# Patient Record
Sex: Female | Born: 1980
Health system: Southern US, Community
[De-identification: ages and names within clinical notes are randomized; demographics above are authoritative.]

## PROBLEM LIST (undated history)

## (undated) DIAGNOSIS — I10 Essential (primary) hypertension: Secondary | ICD-10-CM

## (undated) DIAGNOSIS — R519 Headache, unspecified: Secondary | ICD-10-CM

## (undated) DIAGNOSIS — E119 Type 2 diabetes mellitus without complications: Secondary | ICD-10-CM

## (undated) DIAGNOSIS — D649 Anemia, unspecified: Secondary | ICD-10-CM

## (undated) DIAGNOSIS — F419 Anxiety disorder, unspecified: Secondary | ICD-10-CM

## (undated) DIAGNOSIS — J42 Unspecified chronic bronchitis: Secondary | ICD-10-CM

## (undated) DIAGNOSIS — N189 Chronic kidney disease, unspecified: Secondary | ICD-10-CM

## (undated) DIAGNOSIS — M726 Necrotizing fasciitis: Secondary | ICD-10-CM

## (undated) DIAGNOSIS — R51 Headache: Secondary | ICD-10-CM

## (undated) DIAGNOSIS — G4730 Sleep apnea, unspecified: Secondary | ICD-10-CM

## (undated) HISTORY — DX: Chronic kidney disease, unspecified: N18.9

## (undated) HISTORY — PX: BARIATRIC SURGERY: SHX1103

---

## 2007-11-01 ENCOUNTER — Emergency Department (HOSPITAL_COMMUNITY): Admission: EM | Admit: 2007-11-01 | Discharge: 2007-11-02 | Payer: Self-pay | Admitting: Emergency Medicine

## 2007-12-23 ENCOUNTER — Emergency Department (HOSPITAL_COMMUNITY): Admission: EM | Admit: 2007-12-23 | Discharge: 2007-12-23 | Payer: Self-pay | Admitting: Emergency Medicine

## 2008-02-21 ENCOUNTER — Emergency Department (HOSPITAL_COMMUNITY): Admission: EM | Admit: 2008-02-21 | Discharge: 2008-02-22 | Payer: Self-pay | Admitting: Emergency Medicine

## 2008-03-15 ENCOUNTER — Emergency Department (HOSPITAL_COMMUNITY): Admission: EM | Admit: 2008-03-15 | Discharge: 2008-03-16 | Payer: Self-pay | Admitting: Emergency Medicine

## 2009-12-17 ENCOUNTER — Emergency Department (HOSPITAL_COMMUNITY): Admission: EM | Admit: 2009-12-17 | Discharge: 2009-12-17 | Payer: Self-pay | Admitting: Emergency Medicine

## 2009-12-18 ENCOUNTER — Ambulatory Visit: Payer: Self-pay | Admitting: Vascular Surgery

## 2009-12-18 ENCOUNTER — Encounter (INDEPENDENT_AMBULATORY_CARE_PROVIDER_SITE_OTHER): Payer: Self-pay | Admitting: Emergency Medicine

## 2009-12-18 ENCOUNTER — Ambulatory Visit (HOSPITAL_COMMUNITY): Admission: RE | Admit: 2009-12-18 | Discharge: 2009-12-18 | Payer: Self-pay | Admitting: Emergency Medicine

## 2010-02-13 ENCOUNTER — Emergency Department (HOSPITAL_COMMUNITY): Admission: EM | Admit: 2010-02-13 | Discharge: 2010-02-13 | Payer: Self-pay | Admitting: Emergency Medicine

## 2010-03-08 ENCOUNTER — Emergency Department (HOSPITAL_COMMUNITY): Admission: EM | Admit: 2010-03-08 | Discharge: 2010-03-08 | Payer: Self-pay | Admitting: Emergency Medicine

## 2010-04-16 ENCOUNTER — Observation Stay (HOSPITAL_COMMUNITY): Admission: EM | Admit: 2010-04-16 | Discharge: 2010-04-16 | Payer: Self-pay | Admitting: Ophthalmology

## 2010-04-25 ENCOUNTER — Emergency Department (HOSPITAL_COMMUNITY): Admission: EM | Admit: 2010-04-25 | Discharge: 2010-04-25 | Payer: Self-pay | Admitting: Emergency Medicine

## 2010-06-30 ENCOUNTER — Emergency Department (HOSPITAL_COMMUNITY)
Admission: EM | Admit: 2010-06-30 | Discharge: 2010-07-01 | Payer: Self-pay | Source: Home / Self Care | Admitting: Emergency Medicine

## 2010-07-23 ENCOUNTER — Emergency Department (HOSPITAL_COMMUNITY)
Admission: EM | Admit: 2010-07-23 | Discharge: 2010-07-23 | Payer: Self-pay | Source: Home / Self Care | Admitting: Emergency Medicine

## 2010-08-02 LAB — URINALYSIS, ROUTINE W REFLEX MICROSCOPIC
Bilirubin Urine: NEGATIVE
Ketones, ur: NEGATIVE mg/dL
Leukocytes, UA: NEGATIVE
Nitrite: NEGATIVE
Protein, ur: NEGATIVE mg/dL
Specific Gravity, Urine: 1.025 (ref 1.005–1.030)
Urine Glucose, Fasting: 500 mg/dL — AB
Urobilinogen, UA: 0.2 mg/dL (ref 0.0–1.0)
pH: 5.5 (ref 5.0–8.0)

## 2010-08-02 LAB — POCT I-STAT, CHEM 8
BUN: 13 mg/dL (ref 6–23)
Calcium, Ion: 1.18 mmol/L (ref 1.12–1.32)
Chloride: 103 mEq/L (ref 96–112)
Creatinine, Ser: 0.6 mg/dL (ref 0.4–1.2)
Glucose, Bld: 401 mg/dL — ABNORMAL HIGH (ref 70–99)
HCT: 44 % (ref 36.0–46.0)
Hemoglobin: 15 g/dL (ref 12.0–15.0)
Potassium: 4 mEq/L (ref 3.5–5.1)
Sodium: 134 mEq/L — ABNORMAL LOW (ref 135–145)
TCO2: 25 mmol/L (ref 0–100)

## 2010-08-02 LAB — GLUCOSE, CAPILLARY
Glucose-Capillary: 411 mg/dL — ABNORMAL HIGH (ref 70–99)
Glucose-Capillary: 350 mg/dL — ABNORMAL HIGH (ref 70–99)

## 2010-08-02 LAB — URINE MICROSCOPIC-ADD ON

## 2010-08-02 LAB — POCT PREGNANCY, URINE: Preg Test, Ur: NEGATIVE

## 2010-09-10 ENCOUNTER — Emergency Department (HOSPITAL_COMMUNITY)
Admission: EM | Admit: 2010-09-10 | Discharge: 2010-09-11 | Disposition: A | Discharge status: Home / Self Care | Payer: Self-pay | Attending: Emergency Medicine | Admitting: Emergency Medicine

## 2010-09-10 DIAGNOSIS — Z79899 Other long term (current) drug therapy: Secondary | ICD-10-CM | POA: Insufficient documentation

## 2010-09-10 DIAGNOSIS — R609 Edema, unspecified: Secondary | ICD-10-CM | POA: Insufficient documentation

## 2010-09-10 DIAGNOSIS — E119 Type 2 diabetes mellitus without complications: Secondary | ICD-10-CM | POA: Insufficient documentation

## 2010-09-10 DIAGNOSIS — R51 Headache: Secondary | ICD-10-CM | POA: Insufficient documentation

## 2010-09-10 LAB — POCT I-STAT, CHEM 8
BUN: 12 mg/dL (ref 6–23)
Calcium, Ion: 1.15 mmol/L (ref 1.12–1.32)
Chloride: 104 mEq/L (ref 96–112)
Creatinine, Ser: 0.6 mg/dL (ref 0.4–1.2)
Glucose, Bld: 304 mg/dL — ABNORMAL HIGH (ref 70–99)
HCT: 41 % (ref 36.0–46.0)
Hemoglobin: 13.9 g/dL (ref 12.0–15.0)
Sodium: 138 mEq/L (ref 135–145)

## 2010-09-10 LAB — GLUCOSE, CAPILLARY
Glucose-Capillary: 293 mg/dL — ABNORMAL HIGH (ref 70–99)
Glucose-Capillary: 362 mg/dL — ABNORMAL HIGH (ref 70–99)

## 2010-09-11 LAB — GLUCOSE, CAPILLARY: Glucose-Capillary: 281 mg/dL — ABNORMAL HIGH (ref 70–99)

## 2010-09-27 LAB — GLUCOSE, CAPILLARY
Glucose-Capillary: 214 mg/dL — ABNORMAL HIGH (ref 70–99)
Glucose-Capillary: 289 mg/dL — ABNORMAL HIGH (ref 70–99)
Glucose-Capillary: 312 mg/dL — ABNORMAL HIGH (ref 70–99)
Glucose-Capillary: 313 mg/dL — ABNORMAL HIGH (ref 70–99)
Glucose-Capillary: 327 mg/dL — ABNORMAL HIGH (ref 70–99)
Glucose-Capillary: 376 mg/dL — ABNORMAL HIGH (ref 70–99)
Glucose-Capillary: 448 mg/dL — ABNORMAL HIGH (ref 70–99)

## 2010-09-28 LAB — DIFFERENTIAL
Eosinophils Relative: 3 % (ref 0–5)
Lymphocytes Relative: 48 % — ABNORMAL HIGH (ref 12–46)
Lymphs Abs: 5.1 10*3/uL — ABNORMAL HIGH (ref 0.7–4.0)
Monocytes Absolute: 0.6 10*3/uL (ref 0.1–1.0)
Monocytes Relative: 6 % (ref 3–12)
Neutro Abs: 4.6 10*3/uL (ref 1.7–7.7)

## 2010-09-28 LAB — URINALYSIS, ROUTINE W REFLEX MICROSCOPIC
Bilirubin Urine: NEGATIVE
Glucose, UA: >1000 mg/dL — AB
Ketones, ur: NEGATIVE mg/dL
Leukocytes, UA: NEGATIVE
Nitrite: NEGATIVE
Protein, ur: NEGATIVE mg/dL
Specific Gravity, Urine: 1.044 — ABNORMAL HIGH (ref 1.005–1.030)
Urobilinogen, UA: 0.2 mg/dL (ref 0.0–1.0)
pH: 5 (ref 5.0–8.0)

## 2010-09-28 LAB — CBC
HCT: 37.7 % (ref 36.0–46.0)
Hemoglobin: 13.2 g/dL (ref 12.0–15.0)
MCV: 82 fL (ref 78.0–100.0)
RBC: 4.6 MIL/uL (ref 3.87–5.11)
WBC: 10.7 10*3/uL — ABNORMAL HIGH (ref 4.0–10.5)

## 2010-09-28 LAB — POCT I-STAT, CHEM 8
BUN: 15 mg/dL (ref 6–23)
Calcium, Ion: 1.12 mmol/L (ref 1.12–1.32)
Chloride: 103 mEq/L (ref 96–112)
Creatinine, Ser: 0.7 mg/dL (ref 0.4–1.2)
TCO2: 24 mmol/L (ref 0–100)

## 2010-09-28 LAB — BASIC METABOLIC PANEL
BUN: 12 mg/dL (ref 6–23)
Chloride: 104 mEq/L (ref 96–112)
GFR calc Af Amer: >60 mL/min (ref >60)
Potassium: 3.7 mEq/L (ref 3.5–5.1)
Sodium: 134 mEq/L — ABNORMAL LOW (ref 135–145)

## 2010-09-28 LAB — GLUCOSE, CAPILLARY: Glucose-Capillary: 452 mg/dL — ABNORMAL HIGH (ref 70–99)

## 2010-09-28 LAB — URINE MICROSCOPIC-ADD ON

## 2010-09-30 LAB — URINALYSIS, ROUTINE W REFLEX MICROSCOPIC
Bilirubin Urine: NEGATIVE
Nitrite: NEGATIVE
Specific Gravity, Urine: 1.039 — ABNORMAL HIGH (ref 1.005–1.030)
Urobilinogen, UA: 0.2 mg/dL (ref 0.0–1.0)
pH: 5.5 (ref 5.0–8.0)

## 2010-09-30 LAB — GLUCOSE, CAPILLARY
Glucose-Capillary: 168 mg/dL — ABNORMAL HIGH (ref 70–99)
Glucose-Capillary: 239 mg/dL — ABNORMAL HIGH (ref 70–99)
Glucose-Capillary: 388 mg/dL — ABNORMAL HIGH (ref 70–99)
Glucose-Capillary: 447 mg/dL — ABNORMAL HIGH (ref 70–99)

## 2010-09-30 LAB — RAPID STREP SCREEN (MED CTR MEBANE ONLY): Streptococcus, Group A Screen (Direct): NEGATIVE

## 2010-09-30 LAB — POCT I-STAT, CHEM 8
BUN: 9 mg/dL (ref 6–23)
Calcium, Ion: 1.13 mmol/L (ref 1.12–1.32)
Chloride: 101 mEq/L (ref 96–112)
Creatinine, Ser: 0.6 mg/dL (ref 0.4–1.2)
TCO2: 22 mmol/L (ref 0–100)

## 2010-09-30 LAB — URINE MICROSCOPIC-ADD ON

## 2010-10-02 LAB — URINALYSIS, ROUTINE W REFLEX MICROSCOPIC
Bilirubin Urine: NEGATIVE
Leukocytes, UA: NEGATIVE
Nitrite: NEGATIVE
Specific Gravity, Urine: 1.035 — ABNORMAL HIGH (ref 1.005–1.030)
Urobilinogen, UA: 0.2 mg/dL (ref 0.0–1.0)
pH: 6.5 (ref 5.0–8.0)

## 2010-10-02 LAB — CBC
HCT: 34.4 % — ABNORMAL LOW (ref 36.0–46.0)
MCH: 30.1 pg (ref 26.0–34.0)
MCHC: 34 g/dL (ref 30.0–36.0)
MCV: 88.7 fL (ref 78.0–100.0)
Platelets: 284 10*3/uL (ref 150–400)
RDW: 13.8 % (ref 11.5–15.5)
WBC: 8.3 10*3/uL (ref 4.0–10.5)

## 2010-10-02 LAB — DIFFERENTIAL
Basophils Absolute: 0 10*3/uL (ref 0.0–0.1)
Basophils Relative: 1 % (ref 0–1)
Lymphocytes Relative: 42 % (ref 12–46)
Monocytes Relative: 7 % (ref 3–12)
Neutro Abs: 3.9 10*3/uL (ref 1.7–7.7)
Neutrophils Relative %: 47 % (ref 43–77)

## 2010-10-02 LAB — URINE MICROSCOPIC-ADD ON

## 2010-10-02 LAB — COMPREHENSIVE METABOLIC PANEL
Albumin: 3.3 g/dL — ABNORMAL LOW (ref 3.5–5.2)
Alkaline Phosphatase: 51 U/L (ref 39–117)
BUN: 9 mg/dL (ref 6–23)
Creatinine, Ser: 0.57 mg/dL (ref 0.4–1.2)
Glucose, Bld: 278 mg/dL — ABNORMAL HIGH (ref 70–99)
Potassium: 4 mEq/L (ref 3.5–5.1)
Total Protein: 6.9 g/dL (ref 6.0–8.3)

## 2010-10-04 LAB — BASIC METABOLIC PANEL
Calcium: 8.5 mg/dL (ref 8.4–10.5)
GFR calc Af Amer: >60 mL/min (ref >60)
GFR calc non Af Amer: >60 mL/min (ref >60)
Potassium: 3.8 mEq/L (ref 3.5–5.1)
Sodium: 133 mEq/L — ABNORMAL LOW (ref 135–145)

## 2010-10-04 LAB — CBC
HCT: 35.6 % — ABNORMAL LOW (ref 36.0–46.0)
Hemoglobin: 12.1 g/dL (ref 12.0–15.0)
RBC: 4.03 MIL/uL (ref 3.87–5.11)

## 2010-10-04 LAB — POCT I-STAT, CHEM 8
BUN: 12 mg/dL (ref 6–23)
Calcium, Ion: 1.13 mmol/L (ref 1.12–1.32)
Creatinine, Ser: 0.4 mg/dL (ref 0.4–1.2)
Glucose, Bld: 431 mg/dL — ABNORMAL HIGH (ref 70–99)
Hemoglobin: 12.2 g/dL (ref 12.0–15.0)
TCO2: 21 mmol/L (ref 0–100)

## 2010-10-04 LAB — DIFFERENTIAL
Lymphocytes Relative: 43 % (ref 12–46)
Lymphs Abs: 4.2 10*3/uL — ABNORMAL HIGH (ref 0.7–4.0)
Monocytes Absolute: 0.7 10*3/uL (ref 0.1–1.0)
Monocytes Relative: 7 % (ref 3–12)
Neutro Abs: 4.6 10*3/uL (ref 1.7–7.7)

## 2010-10-18 ENCOUNTER — Observation Stay (HOSPITAL_COMMUNITY)
Admission: EM | Admit: 2010-10-18 | Discharge: 2010-10-18 | Disposition: A | Discharge status: Home / Self Care | Payer: Self-pay | Source: Ambulatory Visit | Attending: Emergency Medicine | Admitting: Emergency Medicine

## 2010-10-18 ENCOUNTER — Emergency Department (HOSPITAL_COMMUNITY): Payer: Self-pay

## 2010-10-18 DIAGNOSIS — R072 Precordial pain: Secondary | ICD-10-CM

## 2010-10-18 DIAGNOSIS — R110 Nausea: Secondary | ICD-10-CM | POA: Insufficient documentation

## 2010-10-18 DIAGNOSIS — E119 Type 2 diabetes mellitus without complications: Principal | ICD-10-CM | POA: Insufficient documentation

## 2010-10-18 DIAGNOSIS — I10 Essential (primary) hypertension: Secondary | ICD-10-CM | POA: Insufficient documentation

## 2010-10-18 DIAGNOSIS — R079 Chest pain, unspecified: Secondary | ICD-10-CM | POA: Insufficient documentation

## 2010-10-18 LAB — BASIC METABOLIC PANEL
BUN: 11 mg/dL (ref 6–23)
CO2: 22 mEq/L (ref 19–32)
Chloride: 100 mEq/L (ref 96–112)
Creatinine, Ser: 0.58 mg/dL (ref 0.4–1.2)
Potassium: 4.1 mEq/L (ref 3.5–5.1)

## 2010-10-18 LAB — URINALYSIS, ROUTINE W REFLEX MICROSCOPIC
Glucose, UA: >1000 mg/dL — AB
Ketones, ur: 15 mg/dL — AB
Leukocytes, UA: NEGATIVE
Nitrite: NEGATIVE
Specific Gravity, Urine: 1.025 (ref 1.005–1.030)
pH: 5.5 (ref 5.0–8.0)

## 2010-10-18 LAB — RAPID URINE DRUG SCREEN, HOSP PERFORMED
Amphetamines: NOT DETECTED
Barbiturates: NOT DETECTED
Benzodiazepines: NOT DETECTED
Cocaine: NOT DETECTED
Opiates: NOT DETECTED
Tetrahydrocannabinol: NOT DETECTED

## 2010-10-18 LAB — CBC
MCH: 28.3 pg (ref 26.0–34.0)
MCHC: 35.1 g/dL (ref 30.0–36.0)
MCV: 80.7 fL (ref 78.0–100.0)
Platelets: 329 10*3/uL (ref 150–400)

## 2010-10-18 LAB — POCT CARDIAC MARKERS: Troponin i, poc: <0.05 ng/mL (ref 0.00–0.09)

## 2010-10-18 LAB — D-DIMER, QUANTITATIVE: D-Dimer, Quant: 0.44 ug/mL-FEU (ref 0.00–0.48)

## 2010-10-18 LAB — URINE MICROSCOPIC-ADD ON

## 2011-01-04 ENCOUNTER — Emergency Department (HOSPITAL_COMMUNITY)
Admission: EM | Admit: 2011-01-04 | Discharge: 2011-01-04 | Disposition: A | Discharge status: Home / Self Care | Payer: Self-pay | Attending: Emergency Medicine | Admitting: Emergency Medicine

## 2011-01-04 ENCOUNTER — Emergency Department (HOSPITAL_COMMUNITY): Payer: Self-pay

## 2011-01-04 DIAGNOSIS — M545 Low back pain, unspecified: Secondary | ICD-10-CM | POA: Insufficient documentation

## 2011-01-04 DIAGNOSIS — I10 Essential (primary) hypertension: Secondary | ICD-10-CM | POA: Insufficient documentation

## 2011-01-04 DIAGNOSIS — X58XXXA Exposure to other specified factors, initial encounter: Secondary | ICD-10-CM | POA: Insufficient documentation

## 2011-01-04 DIAGNOSIS — E669 Obesity, unspecified: Secondary | ICD-10-CM | POA: Insufficient documentation

## 2011-01-04 DIAGNOSIS — S335XXA Sprain of ligaments of lumbar spine, initial encounter: Secondary | ICD-10-CM | POA: Insufficient documentation

## 2011-01-04 DIAGNOSIS — E1169 Type 2 diabetes mellitus with other specified complication: Secondary | ICD-10-CM | POA: Insufficient documentation

## 2011-01-04 LAB — POCT I-STAT, CHEM 8
BUN: 9 mg/dL (ref 6–23)
Chloride: 101 mEq/L (ref 96–112)
Creatinine, Ser: 0.4 mg/dL — ABNORMAL LOW (ref 0.50–1.10)
Glucose, Bld: 471 mg/dL — ABNORMAL HIGH (ref 70–99)
Hemoglobin: 14.6 g/dL (ref 12.0–15.0)
Potassium: 3.9 mEq/L (ref 3.5–5.1)
Sodium: 136 mEq/L (ref 135–145)

## 2011-01-04 LAB — URINALYSIS, ROUTINE W REFLEX MICROSCOPIC
Bilirubin Urine: NEGATIVE
Glucose, UA: >1000 mg/dL — AB
Hgb urine dipstick: NEGATIVE
Specific Gravity, Urine: 1.015 (ref 1.005–1.030)
Urobilinogen, UA: 0.2 mg/dL (ref 0.0–1.0)
pH: 5 (ref 5.0–8.0)

## 2011-01-04 LAB — URINE MICROSCOPIC-ADD ON

## 2011-01-04 LAB — GLUCOSE, CAPILLARY: Glucose-Capillary: 350 mg/dL — ABNORMAL HIGH (ref 70–99)

## 2011-01-05 LAB — URINE CULTURE: Colony Count: 25000

## 2011-02-11 ENCOUNTER — Emergency Department (HOSPITAL_COMMUNITY)
Admission: EM | Admit: 2011-02-11 | Discharge: 2011-02-11 | Disposition: A | Discharge status: Home / Self Care | Payer: Self-pay | Attending: Emergency Medicine | Admitting: Emergency Medicine

## 2011-02-11 ENCOUNTER — Emergency Department (HOSPITAL_COMMUNITY): Payer: Self-pay

## 2011-02-11 DIAGNOSIS — I10 Essential (primary) hypertension: Secondary | ICD-10-CM | POA: Insufficient documentation

## 2011-02-11 DIAGNOSIS — E119 Type 2 diabetes mellitus without complications: Secondary | ICD-10-CM | POA: Insufficient documentation

## 2011-02-11 DIAGNOSIS — M7989 Other specified soft tissue disorders: Secondary | ICD-10-CM | POA: Insufficient documentation

## 2011-02-11 DIAGNOSIS — N390 Urinary tract infection, site not specified: Secondary | ICD-10-CM | POA: Insufficient documentation

## 2011-02-11 LAB — DIFFERENTIAL
Eosinophils Absolute: 0.2 10*3/uL (ref 0.0–0.7)
Eosinophils Relative: 2 % (ref 0–5)
Lymphocytes Relative: 40 % (ref 12–46)
Lymphs Abs: 3.5 10*3/uL (ref 0.7–4.0)
Monocytes Absolute: 0.5 10*3/uL (ref 0.1–1.0)

## 2011-02-11 LAB — COMPREHENSIVE METABOLIC PANEL
ALT: 22 U/L (ref 0–35)
BUN: 7 mg/dL (ref 6–23)
CO2: 27 mEq/L (ref 19–32)
Calcium: 9.6 mg/dL (ref 8.4–10.5)
Creatinine, Ser: 0.51 mg/dL (ref 0.50–1.10)
GFR calc Af Amer: >60 mL/min (ref >60)
GFR calc non Af Amer: >60 mL/min (ref >60)
Glucose, Bld: 359 mg/dL — ABNORMAL HIGH (ref 70–99)
Total Protein: 7.4 g/dL (ref 6.0–8.3)

## 2011-02-11 LAB — CBC
HCT: 37.8 % (ref 36.0–46.0)
MCH: 27.3 pg (ref 26.0–34.0)
MCHC: 33.3 g/dL (ref 30.0–36.0)
MCV: 81.8 fL (ref 78.0–100.0)
Platelets: 332 10*3/uL (ref 150–400)
RDW: 13.1 % (ref 11.5–15.5)

## 2011-02-11 LAB — POCT PREGNANCY, URINE: Preg Test, Ur: NEGATIVE

## 2011-02-11 LAB — URINALYSIS, ROUTINE W REFLEX MICROSCOPIC
Bilirubin Urine: NEGATIVE
Leukocytes, UA: NEGATIVE
Nitrite: NEGATIVE
Specific Gravity, Urine: 1.044 — ABNORMAL HIGH (ref 1.005–1.030)
Urobilinogen, UA: 0.2 mg/dL (ref 0.0–1.0)

## 2011-02-11 LAB — URINE MICROSCOPIC-ADD ON

## 2011-02-11 LAB — GLUCOSE, CAPILLARY: Glucose-Capillary: 324 mg/dL — ABNORMAL HIGH (ref 70–99)

## 2011-02-13 LAB — URINE CULTURE: Colony Count: 35000

## 2011-04-06 ENCOUNTER — Emergency Department (HOSPITAL_COMMUNITY)
Admission: EM | Admit: 2011-04-06 | Discharge: 2011-04-07 | Disposition: A | Discharge status: Home / Self Care | Payer: Self-pay | Attending: Emergency Medicine | Admitting: Emergency Medicine

## 2011-04-06 DIAGNOSIS — Z79899 Other long term (current) drug therapy: Secondary | ICD-10-CM | POA: Insufficient documentation

## 2011-04-06 DIAGNOSIS — I10 Essential (primary) hypertension: Secondary | ICD-10-CM | POA: Insufficient documentation

## 2011-04-06 DIAGNOSIS — R110 Nausea: Secondary | ICD-10-CM | POA: Insufficient documentation

## 2011-04-06 DIAGNOSIS — O24919 Unspecified diabetes mellitus in pregnancy, unspecified trimester: Secondary | ICD-10-CM | POA: Insufficient documentation

## 2011-04-06 DIAGNOSIS — E119 Type 2 diabetes mellitus without complications: Secondary | ICD-10-CM | POA: Insufficient documentation

## 2011-04-06 DIAGNOSIS — R42 Dizziness and giddiness: Secondary | ICD-10-CM | POA: Insufficient documentation

## 2011-04-06 LAB — URINALYSIS, ROUTINE W REFLEX MICROSCOPIC
Glucose, UA: >1000 mg/dL — AB
Ketones, ur: 40 mg/dL — AB
Nitrite: NEGATIVE
pH: 5.5 (ref 5.0–8.0)

## 2011-04-06 LAB — BASIC METABOLIC PANEL
CO2: 22 mEq/L (ref 19–32)
Glucose, Bld: 389 mg/dL — ABNORMAL HIGH (ref 70–99)
Potassium: 3.6 mEq/L (ref 3.5–5.1)
Sodium: 132 mEq/L — ABNORMAL LOW (ref 135–145)

## 2011-04-06 LAB — POCT PREGNANCY, URINE: Preg Test, Ur: POSITIVE

## 2011-04-06 LAB — URINE MICROSCOPIC-ADD ON

## 2011-04-06 LAB — CBC
Hemoglobin: 13.1 g/dL (ref 12.0–15.0)
RBC: 4.57 MIL/uL (ref 3.87–5.11)

## 2011-04-07 LAB — GLUCOSE, CAPILLARY
Glucose-Capillary: 316 mg/dL — ABNORMAL HIGH (ref 70–99)
Glucose-Capillary: 222 mg/dL — ABNORMAL HIGH (ref 70–99)

## 2011-04-12 LAB — URINALYSIS, ROUTINE W REFLEX MICROSCOPIC
Glucose, UA: >1000 — AB
Ketones, ur: NEGATIVE
Leukocytes, UA: NEGATIVE
Nitrite: NEGATIVE
Protein, ur: NEGATIVE

## 2011-04-12 LAB — DIFFERENTIAL
Eosinophils Absolute: 0.4
Lymphs Abs: 3.6
Monocytes Relative: 7
Neutrophils Relative %: 58

## 2011-04-12 LAB — POCT I-STAT, CHEM 8
BUN: 14
Chloride: 101
Creatinine, Ser: 0.6
Glucose, Bld: 436 — ABNORMAL HIGH
Hemoglobin: 13.3
Potassium: 3.9

## 2011-04-12 LAB — CBC
HCT: 38
Hemoglobin: 12.7
RDW: 14.6

## 2011-04-12 LAB — POCT PREGNANCY, URINE: Operator id: 161631

## 2011-04-14 LAB — POCT I-STAT 3, VENOUS BLOOD GAS (G3P V)
Bicarbonate: 24
Operator id: 196461
Patient temperature: 97.3
TCO2: 25
pH, Ven: 7.375 — ABNORMAL HIGH
pO2, Ven: 33

## 2011-04-14 LAB — URINE MICROSCOPIC-ADD ON

## 2011-04-14 LAB — POCT I-STAT, CHEM 8
Calcium, Ion: 1.11 — ABNORMAL LOW
Creatinine, Ser: 0.7
Hemoglobin: 14.6
Sodium: 133 — ABNORMAL LOW
TCO2: 21

## 2011-04-14 LAB — DIFFERENTIAL
Eosinophils Absolute: 0.3
Eosinophils Relative: 4
Lymphs Abs: 2.5
Monocytes Absolute: 0.6
Monocytes Relative: 7

## 2011-04-14 LAB — CBC
HCT: 39.5
Hemoglobin: 13.6
MCV: 85.4
RBC: 4.63
WBC: 9.1

## 2011-04-14 LAB — URINALYSIS, ROUTINE W REFLEX MICROSCOPIC
Glucose, UA: >1000 — AB
Hgb urine dipstick: NEGATIVE
Leukocytes, UA: NEGATIVE
Specific Gravity, Urine: >1.046 — ABNORMAL HIGH

## 2011-04-14 LAB — WET PREP, GENITAL

## 2011-04-15 LAB — HERPES SIMPLEX VIRUS CULTURE

## 2011-04-15 LAB — GC/CHLAMYDIA PROBE AMP, GENITAL
Chlamydia, DNA Probe: NEGATIVE
GC Probe Amp, Genital: NEGATIVE

## 2011-04-15 LAB — URINE MICROSCOPIC-ADD ON

## 2011-04-15 LAB — URINALYSIS, ROUTINE W REFLEX MICROSCOPIC
Bilirubin Urine: NEGATIVE
Glucose, UA: >1000 — AB
Hgb urine dipstick: NEGATIVE
Ketones, ur: NEGATIVE
Nitrite: NEGATIVE
Specific Gravity, Urine: 1.043 — ABNORMAL HIGH
pH: 6

## 2011-04-15 LAB — POCT PREGNANCY, URINE: Preg Test, Ur: NEGATIVE

## 2011-04-15 LAB — WET PREP, GENITAL
Clue Cells Wet Prep HPF POC: NONE SEEN
Trich, Wet Prep: NONE SEEN
Yeast Wet Prep HPF POC: NONE SEEN

## 2011-04-16 ENCOUNTER — Emergency Department (HOSPITAL_COMMUNITY)
Admission: EM | Admit: 2011-04-16 | Discharge: 2011-04-17 | Disposition: A | Discharge status: Home / Self Care | Payer: Medicaid Other | Attending: Emergency Medicine | Admitting: Emergency Medicine

## 2011-04-16 DIAGNOSIS — I10 Essential (primary) hypertension: Secondary | ICD-10-CM | POA: Insufficient documentation

## 2011-04-16 DIAGNOSIS — O200 Threatened abortion: Secondary | ICD-10-CM | POA: Insufficient documentation

## 2011-04-16 DIAGNOSIS — N949 Unspecified condition associated with female genital organs and menstrual cycle: Secondary | ICD-10-CM | POA: Insufficient documentation

## 2011-04-16 DIAGNOSIS — E119 Type 2 diabetes mellitus without complications: Secondary | ICD-10-CM | POA: Insufficient documentation

## 2011-04-16 DIAGNOSIS — R350 Frequency of micturition: Secondary | ICD-10-CM | POA: Insufficient documentation

## 2011-04-16 DIAGNOSIS — Z79899 Other long term (current) drug therapy: Secondary | ICD-10-CM | POA: Insufficient documentation

## 2011-04-16 LAB — WET PREP, GENITAL: Yeast Wet Prep HPF POC: NONE SEEN

## 2011-04-16 LAB — POCT PREGNANCY, URINE: Preg Test, Ur: POSITIVE

## 2011-04-16 LAB — URINALYSIS, ROUTINE W REFLEX MICROSCOPIC
Bilirubin Urine: NEGATIVE
Glucose, UA: >1000 mg/dL — AB
Ketones, ur: NEGATIVE mg/dL
Nitrite: NEGATIVE
Specific Gravity, Urine: 1.025 (ref 1.005–1.030)
pH: 6.5 (ref 5.0–8.0)

## 2011-04-16 LAB — URINE MICROSCOPIC-ADD ON

## 2011-04-17 ENCOUNTER — Inpatient Hospital Stay (EMERGENCY_DEPARTMENT_HOSPITAL)
Admission: AD | Admit: 2011-04-17 | Discharge: 2011-04-17 | Disposition: A | Discharge status: Home / Self Care | Payer: Medicaid Other | Source: Ambulatory Visit | Attending: Obstetrics & Gynecology | Admitting: Obstetrics & Gynecology

## 2011-04-17 ENCOUNTER — Other Ambulatory Visit: Payer: Self-pay | Admitting: Obstetrics & Gynecology

## 2011-04-17 ENCOUNTER — Encounter (HOSPITAL_COMMUNITY): Payer: Self-pay

## 2011-04-17 ENCOUNTER — Emergency Department (HOSPITAL_COMMUNITY): Payer: Medicaid Other

## 2011-04-17 ENCOUNTER — Inpatient Hospital Stay (HOSPITAL_COMMUNITY)
Admission: AD | Admit: 2011-04-17 | Discharge: 2011-04-17 | Disposition: A | Discharge status: Home / Self Care | Payer: Medicaid Other | Source: Ambulatory Visit | Attending: Obstetrics & Gynecology | Admitting: Obstetrics & Gynecology

## 2011-04-17 DIAGNOSIS — O200 Threatened abortion: Secondary | ICD-10-CM

## 2011-04-17 DIAGNOSIS — O209 Hemorrhage in early pregnancy, unspecified: Secondary | ICD-10-CM | POA: Insufficient documentation

## 2011-04-17 DIAGNOSIS — O039 Complete or unspecified spontaneous abortion without complication: Secondary | ICD-10-CM | POA: Insufficient documentation

## 2011-04-17 LAB — CBC
HCT: 33.5 % — ABNORMAL LOW (ref 36.0–46.0)
Hemoglobin: 12.1 g/dL (ref 12.0–15.0)
MCHC: 36.1 g/dL — ABNORMAL HIGH (ref 30.0–36.0)
WBC: 11.7 10*3/uL — ABNORMAL HIGH (ref 4.0–10.5)

## 2011-04-17 LAB — HCG, QUANTITATIVE, PREGNANCY
hCG, Beta Chain, Quant, S: 2136 m[IU]/mL — ABNORMAL HIGH (ref <5)
hCG, Beta Chain, Quant, S: 2275 m[IU]/mL — ABNORMAL HIGH (ref <5)

## 2011-04-17 LAB — DIFFERENTIAL
Basophils Absolute: 0.1 10*3/uL (ref 0.0–0.1)
Eosinophils Absolute: 0.3 10*3/uL (ref 0.0–0.7)
Eosinophils Relative: 2 % (ref 0–5)
Lymphocytes Relative: 46 % (ref 12–46)
Lymphs Abs: 5.3 10*3/uL — ABNORMAL HIGH (ref 0.7–4.0)
Monocytes Absolute: 0.8 10*3/uL (ref 0.1–1.0)

## 2011-04-17 LAB — ABO/RH: ABO/RH(D): B POS

## 2011-04-17 LAB — SAMPLE TO BLOOD BANK

## 2011-04-17 MED ORDER — ACETAMINOPHEN 500 MG PO TABS
1000.0000 mg | ORAL_TABLET | Freq: Four times a day (QID) | ORAL | Status: DC | PRN
  Administered 2011-04-17: 1000 mg via ORAL
  Filled 2011-04-17: qty 2

## 2011-04-17 NOTE — ED Provider Notes (Addendum)
Chief Complaint:  Vaginal Bleeding   Jacqueline [name] is  30 y.o. G1P0.  Patient's last menstrual period was 12/27/2010.Marland Kitchen  Her pregnancy status is positive.  She presents complaining of Vaginal Bleeding . Onset is described as ongoing and has been present for  2 days. She was seen and eval last PM at [facility] and found to have a [redacted]w[redacted]d IUGS with bHcg 2136 and bld type B+. She has changed 2 pads today with dk red bld. Denies syncope, pain, or other concerns. Being tx with Flagyl for BV dx at ER visit; GC/chlam pending from same visit.  Past Medical History: Past Medical History  Diagnosis Date  . Diabetes mellitus     Past Surgical History: History reviewed. No pertinent past surgical history.  Family History: No family history on file.  Social History: History  Substance Use Topics  . Smoking status: Never Smoker   . Smokeless tobacco: Not on file  . Alcohol Use: No    Allergies: No Known Allergies  Prescriptions prior to admission  Medication Sig Dispense Refill  . glipiZIDE (GLUCOTROL) 5 MG tablet Take 5 mg by mouth daily.        . metFORMIN (GLUCOPHAGE) 500 MG tablet Take 1,000 mg by mouth daily with supper.        . metFORMIN (GLUCOPHAGE) 500 MG tablet Take 1,500 mg by mouth daily with breakfast.        . metroNIDAZOLE (FLAGYL) 500 MG tablet Take 500 mg by mouth 2 (two) times daily. For 7 days       . prenatal vitamin w/FE, FA (PRENATAL 1 + 1) 27-1 MG TABS Take 1 tablet by mouth daily.            Physical Exam   Blood pressure 117/85, pulse 103, temperature 99 F (37.2 C), temperature source Oral, resp. rate 18, height 5\' 5"  (1.651 m), weight 124.83 kg (275 lb 3.2 oz), last menstrual period 12/27/2010.  General: General appearance - alert, well appearing, and in no distress Focused Gynecological Exam: normal external genitalia, vulva, vagina, cervix, uterus and adnexa; sm dk red bld at cx; exam limited by body habitus  Labs: Recent Results (from the past 24 hour(s))    URINALYSIS, ROUTINE W REFLEX MICROSCOPIC   Collection Time   04/16/11 10:25 PM      Component Value Range   Color, Urine YELLOW  YELLOW    Appearance CLEAR  CLEAR    Specific Gravity, Urine 1.025  1.005 - 1.030    pH 6.5  5.0 - 8.0    Glucose, UA >1000 (*) NEGATIVE (mg/dL)   Hgb urine dipstick SMALL (*) NEGATIVE    Bilirubin Urine NEGATIVE  NEGATIVE    Ketones, ur NEGATIVE  NEGATIVE (mg/dL)   Protein, ur NEGATIVE  NEGATIVE (mg/dL)   Urobilinogen, UA 0.2  0.0 - 1.0 (mg/dL)   Nitrite NEGATIVE  NEGATIVE    Leukocytes, UA NEGATIVE  NEGATIVE   URINE MICROSCOPIC-ADD ON   Collection Time   04/16/11 10:25 PM      Component Value Range   Squamous Epithelial / LPF RARE  RARE    WBC, UA 0-2  <3 (WBC/hpf)   RBC / HPF 0-2  <3 (RBC/hpf)  POCT PREGNANCY, URINE   Collection Time   04/16/11 11:08 PM      Component Value Range   Preg Test, Ur POSITIVE    WET PREP, GENITAL   Collection Time   04/16/11 11:11 PM      Component Value  Range   Yeast, Wet Prep NONE SEEN  NONE SEEN    Trich, Wet Prep NONE SEEN  NONE SEEN    Clue Cells, Wet Prep FEW (*) NONE SEEN    WBC, Wet Prep HPF POC FEW (*) NONE SEEN   DIFFERENTIAL   Collection Time   04/16/11 11:38 PM      Component Value Range   Neutrophils Relative 45  43 - 77 (%)   Neutro Abs 5.2  1.7 - 7.7 (K/uL)   Lymphocytes Relative 46  12 - 46 (%)   Lymphs Abs 5.3 (*) 0.7 - 4.0 (K/uL)   Monocytes Relative 7  3 - 12 (%)   Monocytes Absolute 0.8  0.1 - 1.0 (K/uL)   Eosinophils Relative 2  0 - 5 (%)   Eosinophils Absolute 0.3  0.0 - 0.7 (K/uL)   Basophils Relative 1  0 - 1 (%)   Basophils Absolute 0.1  0.0 - 0.1 (K/uL)  CBC   Collection Time   04/16/11 11:38 PM      Component Value Range   WBC 11.7 (*) 4.0 - 10.5 (K/uL)   RBC 4.12  3.87 - 5.11 (MIL/uL)   Hemoglobin 12.1  12.0 - 15.0 (g/dL)   HCT 33.5 (*) 36.0 - 46.0 (%)   MCV 81.3  78.0 - 100.0 (fL)   MCH 29.4  26.0 - 34.0 (pg)   MCHC 36.1 (*) 30.0 - 36.0 (g/dL)   RDW 13.2  11.5 - 15.5 (%)    Platelets 293  150 - 400 (K/uL)  ABO/RH   Collection Time   04/16/11 11:50 PM      Component Value Range   ABO/RH(D) B POS    SAMPLE TO BLOOD BANK   Collection Time   04/16/11 11:50 PM      Component Value Range   Blood Bank Specimen SAMPLE AVAILABLE FOR TESTING     Sample Expiration 04/18/2011    HCG, QUANTITATIVE, PREGNANCY   Collection Time   04/16/11 11:53 PM      Component Value Range   hCG, Beta Chain, Quant, S 2136 (*) <5 (mIU/mL)  HCG, QUANTITATIVE, PREGNANCY   Collection Time   04/17/11  1:34 PM      Component Value Range   hCG, Beta Chain, Quant, S 2275 (*) <5 (mIU/mL)   Imaging Studies:  US Ob Comp Less 14 Wks  04/17/2011  Report is combined with the transabdominal ultrasound performed at the same date.  See additional report.  Original Report Authenticated By: Neale Burly, M.D.   US Ob Transvaginal  04/17/2011  *RADIOLOGY REPORT*  Clinical Data: Abdominal pain and spotting.  Estimated gestational age by LMP is 7 weeks 1 day.  OBSTETRIC <14 WK [place] AND TRANSVAGINAL OB US  Technique:  Both transabdominal and transvaginal ultrasound examinations were performed for complete evaluation of the gestation as well as the maternal uterus, adnexal regions, and pelvic cul-de-sac.  Transvaginal technique was performed to assess early pregnancy.  Comparison:  None.  Intrauterine gestational sac:  A single intrauterine gestational sac is visualized. Yolk sac: Demonstrated. Embryo: Demonstrated Cardiac Activity: Not visualized, likely due to early gestational age and small size.  MSD: 4.9  mm  5    w 2    d CRL: 2.7   mm  5   w  6   d         [place] EDC: 12/14/2011  Maternal uterus/adnexae: The right ovary measures 3.2 x 2.6 x 2.4 cm.  The left ovary measures 3.0 x 2.4 x 2.5 cm.  No abnormal adnexal masses.  No free pelvic fluid collections.  IMPRESSION: Single intrauterine gestation is visualized.  The crown-rump length is consistent with estimated gestational age of [redacted] weeks 6 days. Fetal  cardiac activity is not visualized, likely due to early gestational age and small size.  Original Report Authenticated By: Neale Burly, M.D.     Assessment: Early IUP Vag bldg  Plan: Rev'd unable to definitively give a diagnosis as it has not been at least 48 hrs since last quant. Rev'd the positive sign of the quant increasing slightly today as opposed to decreasing. Instructed to return to MAU with increased bldg to the point of saturating >1 pad/hr. Otherwise to keep appt at sched at [facility] at 10:30a tomorrow.  Serita Grammes 04/17/2011 2:50 PM

## 2011-04-17 NOTE — Progress Notes (Signed)
Pt was seen in [facility] ED last night for bleeding and pain. This morning pt reports that bleeding has increased.

## 2011-04-17 NOTE — Progress Notes (Signed)
Pt recently discharged from MAU with bleeding/miscarriage precautions. Pt hystaricle, crying, brought directly back to room 3 via wheelchair. Pt brought a bag with blood and questionable products of conception. [place]. [place] notified.

## 2011-04-17 NOTE — ED Provider Notes (Signed)
Agree with above note.  Jacqueline [name] H. 04/17/2011 2:52 PM

## 2011-04-17 NOTE — ED Notes (Signed)
Questionable products of conception sent to Lab as ordered

## 2011-04-17 NOTE — ED Provider Notes (Signed)
Pt was d/c from MAU and when almost home she began with inc low abd pain and rectal pressure. On the toilet she passed per vagina blood and also tissue which she brought with her upon returning to MAU. She now reports mild low left quad discomfort and a sm amt of vag bldg.  VSS Abd- soft and NT to palp  Pelvic- sm amt blood on pad Passed tissue- 2 pieces approx 3 and 4cm in size; no determinable parts- will need path eval  Threatened vs complete AB  Rev'd with Dr Gala Romney: Tylenol now for pain and will send specimen to path; d/c pt home with bldg precautions. To keep appt with [facility] in AM as scheduled where she can be followed with further quants.

## 2011-04-17 NOTE — ED Provider Notes (Signed)
Agree with above note.  LEGGETT,KELLY H. 04/17/2011 3:09 PM

## 2011-04-17 NOTE — Progress Notes (Signed)
Onset of pain on left side last night was seen at [facility] ED also had spotting, had vaginal [place] had more bleeding this morning like a period flow changed pad x 2 times

## 2011-04-19 LAB — POCT I-STAT, CHEM 8
Calcium, Ion: 1.23 mmol/L (ref 1.12–1.32)
Chloride: 101 mEq/L (ref 96–112)
Creatinine, Ser: 0.8 mg/dL (ref 0.50–1.10)
Glucose, Bld: 241 mg/dL — ABNORMAL HIGH (ref 70–99)
Potassium: 3.7 mEq/L (ref 3.5–5.1)

## 2011-04-19 NOTE — ED Provider Notes (Signed)
Pt has known IUP from scan within last 24 hrs.

## 2011-04-22 ENCOUNTER — Ambulatory Visit (HOSPITAL_COMMUNITY): Payer: Self-pay

## 2011-06-21 ENCOUNTER — Encounter (HOSPITAL_BASED_OUTPATIENT_CLINIC_OR_DEPARTMENT_OTHER): Payer: Self-pay

## 2011-06-21 ENCOUNTER — Emergency Department (HOSPITAL_BASED_OUTPATIENT_CLINIC_OR_DEPARTMENT_OTHER)
Admission: EM | Admit: 2011-06-21 | Discharge: 2011-06-21 | Disposition: A | Discharge status: Home / Self Care | Payer: Medicaid Other | Attending: Emergency Medicine | Admitting: Emergency Medicine

## 2011-06-21 DIAGNOSIS — E1169 Type 2 diabetes mellitus with other specified complication: Secondary | ICD-10-CM | POA: Insufficient documentation

## 2011-06-21 DIAGNOSIS — R42 Dizziness and giddiness: Secondary | ICD-10-CM | POA: Insufficient documentation

## 2011-06-21 DIAGNOSIS — R739 Hyperglycemia, unspecified: Secondary | ICD-10-CM

## 2011-06-21 LAB — DIFFERENTIAL
Basophils Absolute: 0.1 10*3/uL (ref 0.0–0.1)
Basophils Relative: 1 % (ref 0–1)
Eosinophils Absolute: 0.2 10*3/uL (ref 0.0–0.7)
Eosinophils Relative: 2 % (ref 0–5)
Monocytes Absolute: 0.6 10*3/uL (ref 0.1–1.0)

## 2011-06-21 LAB — CBC
HCT: 34.5 % — ABNORMAL LOW (ref 36.0–46.0)
MCH: 29.2 pg (ref 26.0–34.0)
MCHC: 36.2 g/dL — ABNORMAL HIGH (ref 30.0–36.0)
MCV: 80.6 fL (ref 78.0–100.0)
RDW: 12 % (ref 11.5–15.5)

## 2011-06-21 LAB — BASIC METABOLIC PANEL
BUN: 11 mg/dL (ref 6–23)
CO2: 24 mEq/L (ref 19–32)
Calcium: 9.2 mg/dL (ref 8.4–10.5)
Chloride: 100 mEq/L (ref 96–112)
Creatinine, Ser: 0.5 mg/dL (ref 0.50–1.10)

## 2011-06-21 LAB — GLUCOSE, CAPILLARY: Glucose-Capillary: 306 mg/dL — ABNORMAL HIGH (ref 70–99)

## 2011-06-21 LAB — URINALYSIS, ROUTINE W REFLEX MICROSCOPIC
Bilirubin Urine: NEGATIVE
Ketones, ur: NEGATIVE mg/dL
Nitrite: NEGATIVE
Protein, ur: NEGATIVE mg/dL
Urobilinogen, UA: 0.2 mg/dL (ref 0.0–1.0)
pH: 5.5 (ref 5.0–8.0)

## 2011-06-21 LAB — URINE MICROSCOPIC-ADD ON

## 2011-06-21 MED ORDER — SODIUM CHLORIDE 0.9 % IV BOLUS (SEPSIS)
1000.0000 mL | Freq: Once | INTRAVENOUS | Status: AC
  Administered 2011-06-21: 1000 mL via INTRAVENOUS

## 2011-06-21 MED ORDER — GLIPIZIDE 10 MG PO TABS: 10.0000 mg | ORAL_TABLET | Freq: Every day | ORAL | Status: DC

## 2011-06-21 MED ORDER — METFORMIN HCL 500 MG PO TABS: 500.0000 mg | ORAL_TABLET | Freq: Two times a day (BID) | ORAL | Status: DC

## 2011-06-21 NOTE — ED Provider Notes (Signed)
History     CSN: [id number] Arrival date & time: 06/21/2011  5:28 PM   First MD Initiated Contact with Patient 06/21/11 1757      Chief Complaint  Patient presents with  . Dizziness  . Hyperglycemia    (Consider location/radiation/quality/duration/timing/severity/associated sxs/prior treatment) HPI Comments: Patient presents to the emergency department today for persistent hyperglycemia at home.  Patient is also noted some lightheadedness which is worse upon standing.  She's not had any syncopal episodes.  She's otherwise felt well.  Her midway patient does note some increased urination and increased thirst but she does not note that at this time.  Patient was recently started on metformin and glipizide by an emergency physician because the patient does not have a primary care physician.  She states she has been referred to primary care physician but was unable to see them due to her lack of insurance and where she lives.  Patient does have a glucometer at home.  She otherwise has no fevers, nausea, vomiting, urinary symptoms or abdominal pain.  No chest pain.  No shortness of breath the  Patient is a 30 y.o. female presenting with diabetes problem. The history is provided by the patient.  Diabetes Pertinent negatives for hypoglycemia include no confusion or headaches. Pertinent negatives for diabetes include no chest pain.    Past Medical History  Diagnosis Date  . Diabetes mellitus     History reviewed. No pertinent past surgical history.  No family history on file.  History  Substance Use Topics  . Smoking status: Never Smoker   . Smokeless tobacco: Not on file  . Alcohol Use: No    OB History    Grav Para Term Preterm Abortions TAB SAB Ect Mult Living   1               Review of Systems  Constitutional: Negative.  Negative for fever and chills.  HENT: Negative.   Eyes: Negative.  Negative for discharge and redness.  Respiratory: Negative.  Negative for cough and  shortness of breath.   Cardiovascular: Negative.  Negative for chest pain.  Gastrointestinal: Negative.  Negative for nausea, vomiting, abdominal pain and diarrhea.  Genitourinary: Positive for decreased urine volume. Negative for dysuria and vaginal discharge.  Musculoskeletal: Negative.  Negative for back pain.  Skin: Negative.  Negative for color change and rash.  Neurological: Negative.  Negative for syncope and headaches.  Hematological: Negative.  Negative for adenopathy.  Psychiatric/Behavioral: Negative.  Negative for confusion.  All other systems reviewed and are negative.    Allergies  Review of patient's allergies indicates no known allergies.  Home Medications   Current Outpatient Rx  Name Route Sig Dispense Refill  . ACETAMINOPHEN 500 MG PO TABS Oral Take 1,000 mg by mouth every 6 (six) hours as needed. For pain     . GLIPIZIDE 5 MG PO TABS Oral Take 5 mg by mouth daily.      Marland Kitchen METFORMIN HCL 500 MG PO TABS Oral Take 1,000-1,500 mg by mouth 2 (two) times daily. Take 3 tabs in the morning and 2 tabs in the evening    . METRONIDAZOLE 500 MG PO TABS Oral Take 500 mg by mouth 2 (two) times daily. For 7 days     . PRENATAL PLUS 27-1 MG PO TABS Oral Take 1 tablet by mouth daily.        BP 121/65  Pulse 94  Temp(Src) 98.8 F (37.1 C) (Oral)  Resp 18  Ht 5'  4" (1.626 m)  Wt 278 lb (126.1 kg)  BMI 47.72 kg/m2  SpO2 98%  LMP 12/27/2010  Breastfeeding? Unknown  Physical Exam  Constitutional: She is oriented to person, place, and time. She appears well-developed and well-nourished.  Non-toxic appearance. She does not have a sickly appearance.  HENT:  Head: Normocephalic and atraumatic.  Eyes: Conjunctivae, EOM and lids are normal. Pupils are equal, round, and reactive to light. No scleral icterus.  Neck: Trachea normal and normal range of motion. Neck supple.  Cardiovascular: Normal rate, regular rhythm and normal heart sounds.   Pulmonary/Chest: Effort normal and breath  sounds normal.  Abdominal: Soft. Normal appearance. There is no tenderness. There is no rebound, no guarding and no CVA tenderness.  Musculoskeletal: Normal range of motion.  Neurological: She is alert and oriented to person, place, and time. She has normal strength.  Skin: Skin is warm, dry and intact. No rash noted.  Psychiatric: She has a normal mood and affect. Her behavior is normal. Judgment and thought content normal.    ED Course  Procedures (including critical care time)  Labs Reviewed  BASIC METABOLIC PANEL - Abnormal; Notable for the following:    Sodium 134 (*)    Glucose, Bld 374 (*)    All other components within normal limits  CBC - Abnormal; Notable for the following:    HCT 34.5 (*)    MCHC 36.2 (*)    All other components within normal limits  DIFFERENTIAL - Abnormal; Notable for the following:    Lymphs Abs 4.4 (*)    All other components within normal limits  URINALYSIS, ROUTINE W REFLEX MICROSCOPIC - Abnormal; Notable for the following:    Specific Gravity, Urine >1.046 (*)    Glucose, UA >1000 (*)    All other components within normal limits  GLUCOSE, CAPILLARY - Abnormal; Notable for the following:    Glucose-Capillary 407 (*)    All other components within normal limits  GLUCOSE, CAPILLARY - Abnormal; Notable for the following:    Glucose-Capillary 306 (*)    All other components within normal limits  PREGNANCY, URINE  URINE MICROSCOPIC-ADD ON  POCT CBG MONITORING  POCT CBG MONITORING   No results found.   No diagnosis found.    MDM  Patient with hyperglycemia with new onset diabetes over the last few months.  She does not have a primary care physician at this time and has been counseled about the importance of obtaining once for continued diabetes care.  I will refill the patient's prescriptions for her metformin and glipizide at home.  She's been rehydrated here as a cause of her lightheadedness is likely some dehydration due to her ongoing  hyperglycemia home.  As her sugar has decreased and the patient has not had DKA I feel she is safe for discharge home.        Lezlie Octave, MD 06/21/11 2014

## 2011-06-21 NOTE — ED Notes (Signed)
Pt reports dizziness that started yesterday and hyperglycemia. CBG 374

## 2011-06-21 NOTE — ED Notes (Signed)
Dr Hosmer at bedside. 

## 2011-08-22 ENCOUNTER — Encounter (HOSPITAL_BASED_OUTPATIENT_CLINIC_OR_DEPARTMENT_OTHER): Payer: Self-pay | Admitting: *Deleted

## 2011-08-22 ENCOUNTER — Emergency Department (HOSPITAL_BASED_OUTPATIENT_CLINIC_OR_DEPARTMENT_OTHER)
Admission: EM | Admit: 2011-08-22 | Discharge: 2011-08-22 | Disposition: A | Discharge status: Home / Self Care | Payer: Medicaid Other | Attending: Emergency Medicine | Admitting: Emergency Medicine

## 2011-08-22 DIAGNOSIS — R739 Hyperglycemia, unspecified: Secondary | ICD-10-CM

## 2011-08-22 DIAGNOSIS — E1169 Type 2 diabetes mellitus with other specified complication: Secondary | ICD-10-CM | POA: Insufficient documentation

## 2011-08-22 LAB — COMPREHENSIVE METABOLIC PANEL WITH GFR
Albumin: 3.5 g/dL (ref 3.5–5.2)
Alkaline Phosphatase: 70 U/L (ref 39–117)
BUN: 12 mg/dL (ref 6–23)
CO2: 23 meq/L (ref 19–32)
Chloride: 98 meq/L (ref 96–112)
Potassium: 3.6 meq/L (ref 3.5–5.1)
Total Bilirubin: 0.4 mg/dL (ref 0.3–1.2)

## 2011-08-22 LAB — CBC
HCT: 36.1 % (ref 36.0–46.0)
Hemoglobin: 12.9 g/dL (ref 12.0–15.0)
MCH: 28.5 pg (ref 26.0–34.0)
MCHC: 35.7 g/dL (ref 30.0–36.0)
MCV: 79.7 fL (ref 78.0–100.0)
Platelets: 322 10*3/uL (ref 150–400)
RBC: 4.53 MIL/uL (ref 3.87–5.11)
RDW: 12.3 % (ref 11.5–15.5)
WBC: 9.7 K/uL (ref 4.0–10.5)

## 2011-08-22 LAB — COMPREHENSIVE METABOLIC PANEL
ALT: 13 U/L (ref 0–35)
AST: 10 U/L (ref 0–37)
Calcium: 9.6 mg/dL (ref 8.4–10.5)
Creatinine, Ser: 0.5 mg/dL (ref 0.50–1.10)
GFR calc Af Amer: >90 mL/min (ref >90)
GFR calc non Af Amer: >90 mL/min (ref >90)
Glucose, Bld: 425 mg/dL — ABNORMAL HIGH (ref 70–99)
Sodium: 132 mEq/L — ABNORMAL LOW (ref 135–145)
Total Protein: 7.4 g/dL (ref 6.0–8.3)

## 2011-08-22 LAB — GLUCOSE, CAPILLARY
Glucose-Capillary: 426 mg/dL — ABNORMAL HIGH (ref 70–99)
Glucose-Capillary: 355 mg/dL — ABNORMAL HIGH (ref 70–99)

## 2011-08-22 LAB — URINALYSIS, ROUTINE W REFLEX MICROSCOPIC
Bilirubin Urine: NEGATIVE
Glucose, UA: >1000 mg/dL — AB
Ketones, ur: NEGATIVE mg/dL
Leukocytes, UA: NEGATIVE
Nitrite: NEGATIVE
Protein, ur: NEGATIVE mg/dL
Specific Gravity, Urine: 1.039 — ABNORMAL HIGH (ref 1.005–1.030)
Urobilinogen, UA: 0.2 mg/dL (ref 0.0–1.0)
pH: 5.5 (ref 5.0–8.0)

## 2011-08-22 LAB — PREGNANCY, URINE: Preg Test, Ur: NEGATIVE

## 2011-08-22 LAB — URINE MICROSCOPIC-ADD ON

## 2011-08-22 MED ORDER — METFORMIN HCL 500 MG PO TABS: ORAL_TABLET | ORAL | Status: DC

## 2011-08-22 MED ORDER — GLIPIZIDE 5 MG PO TABS: 5.0000 mg | ORAL_TABLET | Freq: Every day | ORAL | Status: DC

## 2011-08-22 MED ORDER — SODIUM CHLORIDE 0.9 % IV BOLUS (SEPSIS): 1000.0000 mL | Freq: Once | INTRAVENOUS | Status: DC

## 2011-08-22 MED ORDER — SODIUM CHLORIDE 0.9 % IV SOLN
INTRAVENOUS | Status: DC
  Administered 2011-08-22 (×2): via INTRAVENOUS

## 2011-08-22 MED ORDER — SODIUM CHLORIDE 0.9 % IV SOLN: Freq: Once | INTRAVENOUS | Status: DC

## 2011-08-22 MED ORDER — INSULIN REGULAR HUMAN 100 UNIT/ML IJ SOLN
8.0000 [IU] | Freq: Once | INTRAMUSCULAR | Status: AC
  Administered 2011-08-22: 8 [IU] via SUBCUTANEOUS
  Filled 2011-08-22 (×2): qty 3

## 2011-08-22 NOTE — ED Notes (Signed)
Pt up to bathroom, ambulatory without difficulty

## 2011-08-22 NOTE — ED Provider Notes (Signed)
History  This chart was scribed for Saddie Benders. Dorna Mai, MD by Jenne Campus. This patient was seen in room MH02/MH02 and the patient's care was started at 7:09PM.  CSN: [id number]  Arrival date & time 08/22/11  1759   First MD Initiated Contact with Patient 08/22/11 1824      Chief Complaint  Patient presents with  . Hyperglycemia    The history is provided by the patient. No language interpreter was used.    Jacqueline [name] is a 31 y.o. female with a h/o diabetes who presents to the Emergency Department complaining of 2 days of gradual onset, gradually worsening hyperglycemia symptoms including nausea, HA, and a feeling of being dehydrated. Pt states that she ran out of blood sugar medications 2 days ago. Pt states that she came to the ED after she measured her blood sugar at 420 at home. She has not taken any medications to improve her symptoms at home. She denies any modifying factors. She reports that her LNMP started today. She denies vaginal discharge and emesis as associated symptoms. She denies smoking and alcohol use.  Pt has no PCP, because she just moved to the [place] [facility] area and is waiting for her insurance to kick in at her new job.   Past Medical History  Diagnosis Date  . Diabetes mellitus     History reviewed. No pertinent past surgical history.  History reviewed. No pertinent family history.  History  Substance Use Topics  . Smoking status: Never Smoker   . Smokeless tobacco: Not on file  . Alcohol Use: No    OB History    Grav Para Term Preterm Abortions TAB SAB Ect Mult Living   1               Review of Systems  A complete 10 system review of systems was obtained and is otherwise negative except as noted in the HPI and PMH.   Allergies  Review of patient's allergies indicates no known allergies.  Home Medications   Current Outpatient Rx  Name Route Sig Dispense Refill  . ACETAMINOPHEN 500 MG PO TABS Oral Take 1,000 mg by mouth every 6 (six) hours  as needed. For pain     . GERITOL COMPLETE PO TABS Oral Take 1 tablet by mouth daily.    Marland Kitchen GLIPIZIDE 5 MG PO TABS Oral Take 1 tablet (5 mg total) by mouth daily. 30 tablet 1  . METFORMIN HCL 500 MG PO TABS  Take 3 tabs in the morning and 2 tabs at night by mouth 90 tablet 1    Triage Vitals: BP 168/97  Pulse 106  Temp(Src) 99.2 F (37.3 C) (Oral)  Resp 22  SpO2 100%  LMP 12/27/2010   Physical Exam  Nursing note and vitals reviewed. Constitutional: She is oriented to person, place, and time. She appears well-developed and well-nourished.  HENT:  Head: Normocephalic and atraumatic.  Eyes: Conjunctivae and EOM are normal.  Neck: Normal range of motion. Neck supple.  Cardiovascular: Normal rate and regular rhythm.   Pulmonary/Chest: Effort normal and breath sounds normal. No respiratory distress.  Abdominal: Soft. There is no tenderness.  Musculoskeletal: Normal range of motion. She exhibits no edema.  Neurological: She is alert and oriented to person, place, and time. No cranial nerve deficit.  Skin: Skin is warm and dry.  Psychiatric: She has a normal mood and affect. Her behavior is normal.    ED Course  Procedures (including critical care time)  DIAGNOSTIC STUDIES:  Oxygen Saturation is 100% on room air, normal by my interpretation.    COORDINATION OF CARE: 7:11PM-Discussed insulin with pt and pt agreed. Pt turned down antinausea medication.  Discussed discharge home when blood sugar lowers to the 200s. Will refill pt's metformin and glipizide and will refer pt to a PCP.   Labs Reviewed  GLUCOSE, CAPILLARY - Abnormal; Notable for the following:    Glucose-Capillary 426 (*)    All other components within normal limits  COMPREHENSIVE METABOLIC PANEL - Abnormal; Notable for the following:    Sodium 132 (*)    Glucose, Bld 425 (*)    All other components within normal limits  URINALYSIS, ROUTINE W REFLEX MICROSCOPIC - Abnormal; Notable for the following:    Specific  Gravity, Urine 1.039 (*)    Glucose, UA >1000 (*)    Hgb urine dipstick MODERATE (*)    All other components within normal limits  GLUCOSE, CAPILLARY - Abnormal; Notable for the following:    Glucose-Capillary 355 (*)    All other components within normal limits  CBC  PREGNANCY, URINE  URINE MICROSCOPIC-ADD ON   No results found.   1. Hyperglycemia       MDM   Patient with hyperglycemia and likely some mild dehydration. Her blood tests do not suggest DKA. The patient denies any actual vomiting. She has been able to eat today. She reports that this correlates with her running out of her medication 2 days ago. Otherwise her vital signs are stable. The patient received 2 L of IV fluids and some subcutaneous insulin. The patient reports feeling improved and would like to go home. She doesn't have insurance that we'll be starting soon and that she will apply for a primary care physician either in this area or near her current home in [place]. My plan is to give her refills of her usual medications. She knows to return if she feels worse in any way.      I personally performed the services described in this documentation, which was scribed in my presence. The recorded information has been reviewed and considered.    Saddie Benders. Violett Hobbs, MD 08/22/11 2150

## 2011-08-22 NOTE — ED Notes (Signed)
Hyperglycemia 420 at home. Headache, dizziness and urinary frequency.

## 2011-08-22 NOTE — ED Notes (Signed)
MD at bedside. 

## 2011-08-22 NOTE — ED Notes (Signed)
States she ran out of her medications 2 days ago and no longer has a doctor.

## 2011-11-04 ENCOUNTER — Encounter (HOSPITAL_BASED_OUTPATIENT_CLINIC_OR_DEPARTMENT_OTHER): Payer: Self-pay

## 2011-11-04 ENCOUNTER — Emergency Department (HOSPITAL_BASED_OUTPATIENT_CLINIC_OR_DEPARTMENT_OTHER)
Admission: EM | Admit: 2011-11-04 | Discharge: 2011-11-04 | Disposition: A | Discharge status: Home / Self Care | Payer: Medicaid Other | Attending: Emergency Medicine | Admitting: Emergency Medicine

## 2011-11-04 DIAGNOSIS — R739 Hyperglycemia, unspecified: Secondary | ICD-10-CM

## 2011-11-04 DIAGNOSIS — R197 Diarrhea, unspecified: Secondary | ICD-10-CM

## 2011-11-04 DIAGNOSIS — E1169 Type 2 diabetes mellitus with other specified complication: Secondary | ICD-10-CM | POA: Insufficient documentation

## 2011-11-04 DIAGNOSIS — R1110 Vomiting, unspecified: Secondary | ICD-10-CM

## 2011-11-04 LAB — BASIC METABOLIC PANEL
BUN: 14 mg/dL (ref 6–23)
CO2: 24 mEq/L (ref 19–32)
Chloride: 101 mEq/L (ref 96–112)
Creatinine, Ser: 0.5 mg/dL (ref 0.50–1.10)
GFR calc Af Amer: >90 mL/min (ref >90)
Glucose, Bld: 338 mg/dL — ABNORMAL HIGH (ref 70–99)
Potassium: 3.8 mEq/L (ref 3.5–5.1)

## 2011-11-04 LAB — URINALYSIS, ROUTINE W REFLEX MICROSCOPIC
Bilirubin Urine: NEGATIVE
Glucose, UA: >1000 mg/dL — AB
Hgb urine dipstick: NEGATIVE
Ketones, ur: NEGATIVE mg/dL
Protein, ur: NEGATIVE mg/dL
pH: 5.5 (ref 5.0–8.0)

## 2011-11-04 LAB — URINE MICROSCOPIC-ADD ON

## 2011-11-04 LAB — GLUCOSE, CAPILLARY: Glucose-Capillary: 301 mg/dL — ABNORMAL HIGH (ref 70–99)

## 2011-11-04 MED ORDER — SODIUM CHLORIDE 0.9 % IV BOLUS (SEPSIS): 1000.0000 mL | Freq: Once | INTRAVENOUS | Status: DC

## 2011-11-04 MED ORDER — METFORMIN HCL 500 MG PO TABS: ORAL_TABLET | ORAL | Status: DC

## 2011-11-04 MED ORDER — SODIUM CHLORIDE 0.9 % IV BOLUS (SEPSIS)
1000.0000 mL | Freq: Once | INTRAVENOUS | Status: AC
  Administered 2011-11-04: 1000 mL via INTRAVENOUS

## 2011-11-04 MED ORDER — INSULIN REGULAR HUMAN 100 UNIT/ML IJ SOLN: 5.0000 [IU] | Freq: Once | INTRAMUSCULAR | Status: DC

## 2011-11-04 MED ORDER — ONDANSETRON HCL 4 MG/2ML IJ SOLN
4.0000 mg | Freq: Once | INTRAMUSCULAR | Status: AC
  Administered 2011-11-04: 4 mg via INTRAVENOUS
  Filled 2011-11-04: qty 2

## 2011-11-04 NOTE — ED Notes (Signed)
Pt states that she has been taking medication for hyperglycemia, has not been feeling well today, and has been nauseous/vomiting, and urinating frequently.  CBG readings at home >300.

## 2011-11-04 NOTE — ED Notes (Signed)
CBG 263

## 2011-11-04 NOTE — ED Notes (Signed)
EDP [place] notified of CBG and 1 L NS infused-orders to hold insulin and give 2n L NS

## 2011-11-04 NOTE — ED Provider Notes (Signed)
History     CSN: [id number]  Arrival date & time 11/04/11  1839   First MD Initiated Contact with Patient 11/04/11 1902      Chief Complaint  Patient presents with  . Hyperglycemia    (Consider location/radiation/quality/duration/timing/severity/associated sxs/prior treatment) HPI Comments: Pt states that the she has had vomiting and diarrhea today and she noticed that her blood sugar was above 300:pt denies fever or abdominal:pt states that she has been able to keep her medications down today  The history is provided by the patient. No language interpreter was used.    Past Medical History  Diagnosis Date  . Diabetes mellitus     History reviewed. No pertinent past surgical history.  History reviewed. No pertinent family history.  History  Substance Use Topics  . Smoking status: Never Smoker   . Smokeless tobacco: Not on file  . Alcohol Use: No    OB History    Grav Para Term Preterm Abortions TAB SAB Ect Mult Living   1               Review of Systems  Constitutional: Negative.   Respiratory: Negative.   Cardiovascular: Negative.   Gastrointestinal: Positive for vomiting and diarrhea.  Musculoskeletal: Negative.   Skin: Negative.   Neurological: Negative.     Allergies  Review of patient's allergies indicates no known allergies.  Home Medications   Current Outpatient Rx  Name Route Sig Dispense Refill  . ACETAMINOPHEN 500 MG PO TABS Oral Take 1,000 mg by mouth every 6 (six) hours as needed. For pain     . GLIPIZIDE 5 MG PO TABS Oral Take 1 tablet (5 mg total) by mouth daily. 30 tablet 1  . GERITOL COMPLETE PO TABS Oral Take 1 tablet by mouth daily.    Marland Kitchen METFORMIN HCL 500 MG PO TABS  Take 3 tabs in the morning and 2 tabs at night by mouth 90 tablet 1    BP 167/105  Pulse 111  Temp(Src) 99.1 F (37.3 C) (Oral)  Resp 21  Ht 5\' 5"  (1.651 m)  Wt 270 lb (122.471 kg)  BMI 44.93 kg/m2  SpO2 100%  LMP 10/20/2011  Breastfeeding? No  Physical Exam    Nursing note and vitals reviewed. Constitutional: She is oriented to person, place, and time. She appears well-developed and well-nourished.  HENT:  Head: Normocephalic and atraumatic.  Eyes: Conjunctivae and EOM are normal.  Neck: Neck supple.  Cardiovascular: Normal rate and regular rhythm.   Pulmonary/Chest: Effort normal and breath sounds normal.  Abdominal: Soft. Bowel sounds are normal. There is no tenderness.  Musculoskeletal: Normal range of motion.  Neurological: She is alert and oriented to person, place, and time.    ED Course  Procedures (including critical care time)  Labs Reviewed  URINALYSIS, ROUTINE W REFLEX MICROSCOPIC - Abnormal; Notable for the following:    Specific Gravity, Urine >1.046 (*)    Glucose, UA >1000 (*)    All other components within normal limits  BASIC METABOLIC PANEL - Abnormal; Notable for the following:    Glucose, Bld 338 (*)    All other components within normal limits  GLUCOSE, CAPILLARY - Abnormal; Notable for the following:    Glucose-Capillary 340 (*)    All other components within normal limits  URINE MICROSCOPIC-ADD ON - Abnormal; Notable for the following:    Squamous Epithelial / LPF FEW (*)    Bacteria, UA FEW (*)    All other components within normal limits  GLUCOSE, CAPILLARY - Abnormal; Notable for the following:    Glucose-Capillary 301 (*)    All other components within normal limits  PREGNANCY, URINE   No results found.   1. Hyperglycemia   2. Vomiting and diarrhea       MDM  Pt not in [id number] likely viral:pt requesting a script for meds        Glendell Docker, NP 11/04/11 2218

## 2011-11-04 NOTE — Discharge Instructions (Signed)
B.R.A.T. Diet Your doctor has recommended the B.R.A.T. diet for you or your child until the condition improves. This is often used to help control diarrhea and vomiting symptoms. If you or your child can tolerate clear liquids, you may have:  Bananas.   Rice.   Applesauce.   Toast (and other simple starches such as crackers, potatoes, noodles).  Be sure to avoid dairy products, meats, and fatty foods until symptoms are better. Fruit juices such as apple, grape, and prune juice can make diarrhea worse. Avoid these. Continue this diet for 2 days or as instructed by your caregiver. Document Released: 07/04/2005 Document Revised: 06/23/2011 Document Reviewed: 12/21/2006 [facility] Patient Information 2012 [place].Diabetes, Frequently Asked Questions WHAT IS DIABETES? Most of the food we eat is turned into glucose (sugar). Our bodies use it for energy. The pancreas makes a hormone called insulin. It helps glucose get into the cells of our bodies. When you have diabetes, your body either does not make enough insulin or cannot use its own insulin as well as it should. This causes sugars to build up in your blood. WHAT ARE THE SYMPTOMS OF DIABETES?  Frequent urination.   Excessive thirst.   Unexplained weight loss.   Extreme hunger.   Blurred vision.   Tingling or numbness in hands or feet.   Feeling very tired much of the time.   Dry, itchy skin.   Sores that are slow to heal.   Yeast infections.  WHAT ARE THE TYPES OF DIABETES? Type 1 Diabetes   About 10% of affected people have this type.   Usually occurs before the age of 40.   Usually occurs in thin to normal weight people.  Type 2 Diabetes  About 90% of affected people have this type.   Usually occurs after the age of 10.   Usually occurs in overweight people.   More likely to have:   A family history of diabetes.   A history of diabetes during pregnancy (gestational diabetes).   High blood pressure.    High cholesterol and triglycerides.  Gestational Diabetes  Occurs in about 4% of pregnancies.   Usually goes away after the baby is born.   More likely to occur in women with:   Family history of diabetes.   Previous gestational diabetes.   Obese.   Over 79 years old.  WHAT IS PRE-DIABETES? Pre-diabetes means your blood glucose is higher than normal, but lower than the diabetes range. It also means you are at risk of getting type 2 diabetes and heart disease. If you are told you have pre-diabetes, have your blood glucose checked again in 1 to 2 years. WHAT IS THE TREATMENT FOR DIABETES? Treatment is aimed at keeping blood glucose near normal levels at all times. Learning how to manage this yourself is important in treating diabetes. Depending on the type of diabetes you have, your treatment will include one or more of the following:  Monitoring your blood glucose.   Meal planning.   Exercise.   Oral medicine (pills) or insulin.  CAN DIABETES BE PREVENTED? With type 1 diabetes, prevention is more difficult, because the triggers that cause it are not yet known. With type 2 diabetes, prevention is more likely, with lifestyle changes:  Maintain a healthy weight.   Eat healthy.   Exercise.  IS THERE A CURE FOR DIABETES? No, there is no cure for diabetes. There is a lot of research going on that is looking for a cure, and progress is being  made. Diabetes can be treated and controlled. People with diabetes can manage their diabetes and lead normal, active lives. SHOULD I BE TESTED FOR DIABETES? If you are at least 32 years old, you should be tested for diabetes. You should be tested again every 3 years. If you are 71 or older and overweight, you may want to get tested more often. If you are younger than 63, overweight, and have one or more of the following risk factors, you should be tested:  Family history of diabetes.   Inactive lifestyle.   High blood pressure.  WHAT  ARE SOME OTHER SOURCES FOR INFORMATION ON DIABETES? The following organizations may help in your search for more information on diabetes: National Diabetes Education Program (NDEP) Internet: BloggerBowl.es American Diabetes Association Internet: http://www.diabetes.org  Juvenile Diabetes Foundation International Internet: AffordableSalon.es Document Released: 07/07/2003 Document Revised: 06/23/2011 Document Reviewed: 05/01/2009 [facility] Patient Information 2012 [place].

## 2011-11-05 NOTE — ED Provider Notes (Signed)
Medical screening examination/treatment/procedure(s) were performed by non-physician practitioner and as supervising physician I was immediately available for consultation/collaboration.   Mylinda Latina III, MD 11/05/11 [phone]

## 2012-01-04 ENCOUNTER — Emergency Department (HOSPITAL_BASED_OUTPATIENT_CLINIC_OR_DEPARTMENT_OTHER)
Admission: EM | Admit: 2012-01-04 | Discharge: 2012-01-04 | Disposition: A | Discharge status: Home / Self Care | Payer: Medicaid Other | Attending: Emergency Medicine | Admitting: Emergency Medicine

## 2012-01-04 ENCOUNTER — Encounter (HOSPITAL_BASED_OUTPATIENT_CLINIC_OR_DEPARTMENT_OTHER): Payer: Self-pay | Admitting: *Deleted

## 2012-01-04 DIAGNOSIS — Z794 Long term (current) use of insulin: Secondary | ICD-10-CM | POA: Insufficient documentation

## 2012-01-04 DIAGNOSIS — R739 Hyperglycemia, unspecified: Secondary | ICD-10-CM

## 2012-01-04 DIAGNOSIS — E119 Type 2 diabetes mellitus without complications: Secondary | ICD-10-CM | POA: Insufficient documentation

## 2012-01-04 LAB — DIFFERENTIAL
Eosinophils Absolute: 0.3 10*3/uL (ref 0.0–0.7)
Eosinophils Relative: 3 % (ref 0–5)
Lymphs Abs: 3.4 10*3/uL (ref 0.7–4.0)
Monocytes Absolute: 0.7 10*3/uL (ref 0.1–1.0)
Monocytes Relative: 7 % (ref 3–12)

## 2012-01-04 LAB — PREGNANCY, URINE: Preg Test, Ur: NEGATIVE

## 2012-01-04 LAB — URINALYSIS, ROUTINE W REFLEX MICROSCOPIC
Glucose, UA: >1000 mg/dL — AB
Leukocytes, UA: NEGATIVE
Protein, ur: NEGATIVE mg/dL

## 2012-01-04 LAB — URINE MICROSCOPIC-ADD ON

## 2012-01-04 LAB — CBC
Hemoglobin: 13.4 g/dL (ref 12.0–15.0)
MCH: 28.9 pg (ref 26.0–34.0)
MCV: 80.2 fL (ref 78.0–100.0)
Platelets: 332 10*3/uL (ref 150–400)
RBC: 4.64 MIL/uL (ref 3.87–5.11)

## 2012-01-04 LAB — BASIC METABOLIC PANEL
CO2: 23 mEq/L (ref 19–32)
Chloride: 98 mEq/L (ref 96–112)
GFR calc Af Amer: >90 mL/min (ref >90)

## 2012-01-04 MED ORDER — INSULIN REGULAR HUMAN 100 UNIT/ML IJ SOLN
INTRAMUSCULAR | Status: AC
  Filled 2012-01-04: qty 1

## 2012-01-04 MED ORDER — SODIUM CHLORIDE 0.9 % IV BOLUS (SEPSIS)
2000.0000 mL | Freq: Once | INTRAVENOUS | Status: AC
  Administered 2012-01-04 (×2): 1000 mL via INTRAVENOUS

## 2012-01-04 MED ORDER — INSULIN REGULAR HUMAN 100 UNIT/ML IJ SOLN
5.0000 [IU] | Freq: Once | INTRAMUSCULAR | Status: AC
  Administered 2012-01-04: 11:00:00 via INTRAVENOUS

## 2012-01-04 MED ORDER — GLIPIZIDE 5 MG PO TABS: 10.0000 mg | ORAL_TABLET | Freq: Every day | ORAL | Status: DC

## 2012-01-04 NOTE — ED Notes (Signed)
Patient states she has had left lower back pain radiating into her left groin, symptoms are associated with burning and urgency with urination.  States she had a temperature of 101 last night.  Past hx of kidney infection 2009 with same symptoms.

## 2012-01-04 NOTE — Discharge Instructions (Signed)
Diabetes, Frequently Asked Questions WHAT IS DIABETES? Most of the food we eat is turned into glucose (sugar). Our bodies use it for energy. The pancreas makes a hormone called insulin. It helps glucose get into the cells of our bodies. When you have diabetes, your body either does not make enough insulin or cannot use its own insulin as well as it should. This causes sugars to build up in your blood. WHAT ARE THE SYMPTOMS OF DIABETES?  Frequent urination.   Excessive thirst.   Unexplained weight loss.   Extreme hunger.   Blurred vision.   Tingling or numbness in hands or feet.   Feeling very tired much of the time.   Dry, itchy skin.   Sores that are slow to heal.   Yeast infections.  WHAT ARE THE TYPES OF DIABETES? Type 1 Diabetes   About 10% of affected people have this type.   Usually occurs before the age of 86.   Usually occurs in thin to normal weight people.  Type 2 Diabetes  About 90% of affected people have this type.   Usually occurs after the age of 74.   Usually occurs in overweight people.   More likely to have:   A family history of diabetes.   A history of diabetes during pregnancy (gestational diabetes).   High blood pressure.   High cholesterol and triglycerides.  Gestational Diabetes  Occurs in about 4% of pregnancies.   Usually goes away after the baby is born.   More likely to occur in women with:   Family history of diabetes.   Previous gestational diabetes.   Obese.   Over 46 years old.  WHAT IS PRE-DIABETES? Pre-diabetes means your blood glucose is higher than normal, but lower than the diabetes range. It also means you are at risk of getting type 2 diabetes and heart disease. If you are told you have pre-diabetes, have your blood glucose checked again in 1 to 2 years. WHAT IS THE TREATMENT FOR DIABETES? Treatment is aimed at keeping blood glucose near normal levels at all times. Learning how to manage this yourself is  important in treating diabetes. Depending on the type of diabetes you have, your treatment will include one or more of the following:  Monitoring your blood glucose.   Meal planning.   Exercise.   Oral medicine (pills) or insulin.  CAN DIABETES BE PREVENTED? With type 1 diabetes, prevention is more difficult, because the triggers that cause it are not yet known. With type 2 diabetes, prevention is more likely, with lifestyle changes:  Maintain a healthy weight.   Eat healthy.   Exercise.  IS THERE A CURE FOR DIABETES? No, there is no cure for diabetes. There is a lot of research going on that is looking for a cure, and progress is being made. Diabetes can be treated and controlled. People with diabetes can manage their diabetes and lead normal, active lives. SHOULD I BE TESTED FOR DIABETES? If you are at least 32 years old, you should be tested for diabetes. You should be tested again every 3 years. If you are 31 or older and overweight, you may want to get tested more often. If you are younger than 79, overweight, and have one or more of the following risk factors, you should be tested:  Family history of diabetes.   Inactive lifestyle.   High blood pressure.  WHAT ARE SOME OTHER SOURCES FOR INFORMATION ON DIABETES? The following organizations may help in your search for  more information on diabetes: National Diabetes Education Program (NDEP) Internet: BloggerBowl.es American Diabetes Association Internet: http://www.diabetes.org  Juvenile Diabetes Foundation International Internet: AffordableSalon.es Document Released: 07/07/2003 Document Revised: 06/23/2011 Document Reviewed: 05/01/2009   Hyperglycemia Hyperglycemia occurs when the glucose (sugar) in your blood is too high. Hyperglycemia can happen for many reasons, but it most often happens to people who do not know they have diabetes or are not managing their diabetes properly.  CAUSES  Whether you  have diabetes or not, there are other causes of hyperglycemia. Hyperglycemia can occur when you have diabetes, but it can also occur in other situations that you might not be as aware of, such as: Diabetes  If you have diabetes and are having problems controlling your blood glucose, hyperglycemia could occur because of some of the following reasons:   Not following your meal plan.   Not taking your diabetes medications or not taking it properly.   Exercising less or doing less activity than you normally do.   Being sick.  Pre-diabetes  This cannot be ignored. Before people develop Type 2 diabetes, they almost always have "pre-diabetes." This is when your blood glucose levels are higher than normal, but not yet high enough to be diagnosed as diabetes. Research has shown that some long-term damage to the body, especially the heart and circulatory system, may already be occurring during pre-diabetes. If you take action to manage your blood glucose when you have pre-diabetes, you may delay or prevent Type 2 diabetes from developing.  Stress  If you have diabetes, you may be "diet" controlled or on oral medications or insulin to control your diabetes. However, you may find that your blood glucose is higher than usual in the hospital whether you have diabetes or not. This is often referred to as "stress hyperglycemia." Stress can elevate your blood glucose. This happens because of hormones put out by the body during times of stress. If stress has been the cause of your high blood glucose, it can be followed regularly by your caregiver. That way he/she can make sure your hyperglycemia does not continue to get worse or progress to diabetes.  Steroids  Steroids are medications that act on the infection fighting system (immune system) to block inflammation or infection. One side effect can be a rise in blood glucose. Most people can produce enough extra insulin to allow for this rise, but for those who  cannot, steroids make blood glucose levels go even higher. It is not unusual for steroid treatments to "uncover" diabetes that is developing. It is not always possible to determine if the hyperglycemia will go away after the steroids are stopped. A special blood test called an A1c is sometimes done to determine if your blood glucose was elevated before the steroids were started.  SYMPTOMS  Thirsty.   Frequent urination.   Dry mouth.   Blurred vision.   Tired or fatigue.   Weakness.   Sleepy.   Tingling in feet or leg.  DIAGNOSIS  Diagnosis is made by monitoring blood glucose in one or all of the following ways:  A1c test. This is a chemical found in your blood.   Fingerstick blood glucose monitoring.   Laboratory results.  TREATMENT  First, knowing the cause of the hyperglycemia is important before the hyperglycemia can be treated. Treatment may include, but is not be limited to:  Education.   Change or adjustment in medications.   Change or adjustment in meal plan.   Treatment for an illness, infection,  etc.   More frequent blood glucose monitoring.   Change in exercise plan.   Decreasing or stopping steroids.   Lifestyle changes.  HOME CARE INSTRUCTIONS   Test your blood glucose as directed.   Exercise regularly. Your caregiver will give you instructions about exercise. Pre-diabetes or diabetes which comes on with stress is helped by exercising.   Eat wholesome, balanced meals. Eat often and at regular, fixed times. Your caregiver or nutritionist will give you a meal plan to guide your sugar intake.   Being at an ideal weight is important. If needed, losing as little as 10 to 15 pounds may help improve blood glucose levels.  SEEK MEDICAL CARE IF:   You have questions about medicine, activity, or diet.   You continue to have symptoms (problems such as increased thirst, urination, or weight gain).  SEEK IMMEDIATE MEDICAL CARE IF:   You are vomiting or have  diarrhea.   Your breath smells fruity.   You are breathing faster or slower.   You are very sleepy or incoherent.   You have numbness, tingling, or pain in your feet or hands.   You have chest pain.   Your symptoms get worse even though you have been following your caregiver's orders.   If you have any other questions or concerns.  Document Released: 12/28/2000 Document Revised: 06/23/2011 Document Reviewed: 02/23/2009 [facility] Patient Information 2012 [place]. [employer] Patient Information 2012 [place].

## 2012-01-04 NOTE — ED Notes (Signed)
CBG- 275mg Gilda Crease

## 2012-01-04 NOTE — ED Provider Notes (Signed)
History     CSN: [id number]  Arrival date & time 01/04/12  [id number]   First MD Initiated Contact with Patient 01/04/12 [phone]      Chief Complaint  Patient presents with  . Flank Pain    left    HPI Patient states she has had left lower back pain radiating into her left groin, symptoms are associated with burning and urgency with urination. States she had a temperature of 101 last night. Past hx of kidney infection 2009 with same symptoms.  Past Medical History  Diagnosis Date  . Diabetes mellitus     History reviewed. No pertinent past surgical history.  No family history on file.  History  Substance Use Topics  . Smoking status: Never Smoker   . Smokeless tobacco: Not on file  . Alcohol Use: No    OB History    Grav Para Term Preterm Abortions TAB SAB Ect Mult Living   1               Review of Systems  All other systems reviewed and are negative.    Allergies  Review of patient's allergies indicates no known allergies.  Home Medications   Current Outpatient Rx  Name Route Sig Dispense Refill  . ACETAMINOPHEN 500 MG PO TABS Oral Take 1,000 mg by mouth every 6 (six) hours as needed. For pain     . GLIPIZIDE 5 MG PO TABS Oral Take 2 tablets (10 mg total) by mouth daily. 30 tablet 1  . GERITOL COMPLETE PO TABS Oral Take 1 tablet by mouth daily.    Marland Kitchen METFORMIN HCL 500 MG PO TABS  Take 3 tabs in the morning and 2 tabs at night by mouth 90 tablet 1  . METFORMIN HCL 500 MG PO TABS  3 tablets in the morning and 2 tablets at night 100 tablet 0    BP 144/92  Pulse 114  Temp 98.7 F (37.1 C) (Oral)  Resp 16  Ht 5\' 5"  (1.651 m)  Wt 270 lb (122.471 kg)  BMI 44.93 kg/m2  SpO2 100%  LMP 12/18/2010  Physical Exam  Nursing note and vitals reviewed. Constitutional: She is oriented to person, place, and time. She appears well-developed and well-nourished. No distress.  HENT:  Head: Normocephalic and atraumatic.  Eyes: Pupils are equal, round, and reactive to light.    Neck: Normal range of motion.  Cardiovascular: Normal rate and intact distal pulses.   Pulmonary/Chest: No respiratory distress.  Abdominal: Normal appearance. She exhibits no distension. There is no tenderness.  Musculoskeletal:       Thoracic back: She exhibits no tenderness.       Back:  Neurological: She is alert and oriented to person, place, and time. No cranial nerve deficit.  Skin: Skin is warm and dry. No rash noted.  Psychiatric: She has a normal mood and affect. Her behavior is normal.    ED Course  Procedures (including critical care time) Scheduled Meds:   . insulin regular  5 Units Intravenous Once  . sodium chloride  2,000 mL Intravenous Once   Continuous Infusions:  PRN Meds:.  Labs Reviewed  URINALYSIS, ROUTINE W REFLEX MICROSCOPIC - Abnormal; Notable for the following:    Specific Gravity, Urine 1.041 (*)     Glucose, UA >1000 (*)     Ketones, ur 40 (*)     All other components within normal limits  BASIC METABOLIC PANEL - Abnormal; Notable for the following:    Sodium 131 (*)  Glucose, Bld 499 (*)     All other components within normal limits  GLUCOSE, CAPILLARY - Abnormal; Notable for the following:    Glucose-Capillary 275 (*)     All other components within normal limits  CBC  DIFFERENTIAL  PREGNANCY, URINE  URINE MICROSCOPIC-ADD ON  URINE CULTURE   No results found.   1. Hyperglycemia       MDM          Dot Lanes, MD 01/04/12 [phone]

## 2012-01-05 LAB — URINE CULTURE: Colony Count: NO GROWTH

## 2012-03-24 ENCOUNTER — Encounter (HOSPITAL_COMMUNITY): Payer: Self-pay

## 2012-03-24 ENCOUNTER — Emergency Department (HOSPITAL_COMMUNITY)
Admission: EM | Admit: 2012-03-24 | Discharge: 2012-03-24 | Disposition: A | Discharge status: Home / Self Care | Payer: 59 | Attending: Emergency Medicine | Admitting: Emergency Medicine

## 2012-03-24 DIAGNOSIS — E119 Type 2 diabetes mellitus without complications: Secondary | ICD-10-CM | POA: Insufficient documentation

## 2012-03-24 DIAGNOSIS — R200 Anesthesia of skin: Secondary | ICD-10-CM

## 2012-03-24 DIAGNOSIS — M79609 Pain in unspecified limb: Secondary | ICD-10-CM | POA: Insufficient documentation

## 2012-03-24 DIAGNOSIS — R209 Unspecified disturbances of skin sensation: Secondary | ICD-10-CM | POA: Insufficient documentation

## 2012-03-24 MED ORDER — PREDNISONE 10 MG PO TABS: 20.0000 mg | ORAL_TABLET | Freq: Two times a day (BID) | ORAL | Status: DC

## 2012-03-24 NOTE — ED Notes (Signed)
Pt complains of irght foot pain and swlelling no known inj.

## 2012-03-24 NOTE — ED Provider Notes (Signed)
History   This chart was scribed for Veryl Speak, MD by Shona Needles. The patient was seen in room TR09C/TR09C. Patient's care was started at 1758.  CSN: [id number]  Arrival date & time 03/24/12  1758   First MD Initiated Contact with Patient 03/24/12 1831      Chief Complaint  Patient presents with  . Foot Pain   HPI  Jacqueline [name] is a 31 y.o. female with a h/o diabetes who presents to the Emergency Department complaining of 2 weeks of numbness and cramping of the right foot. Pt reports that pain began in the great toe of the right foot and pain has spread throughout the foot. Pt denies pain in the left foot, strength deficit, bowel problems, bladder problems, and fever.   Past Medical History  Diagnosis Date  . Diabetes mellitus     History reviewed. No pertinent past surgical history.  History reviewed. No pertinent family history.  History  Substance Use Topics  . Smoking status: Never Smoker   . Smokeless tobacco: Not on file  . Alcohol Use: No    OB History    Grav Para Term Preterm Abortions TAB SAB Ect Mult Living   1               Review of Systems  Allergies  Review of patient's allergies indicates no known allergies.  Home Medications   Current Outpatient Rx  Name Route Sig Dispense Refill  . ACETAMINOPHEN 500 MG PO TABS Oral Take 1,000 mg by mouth every 6 (six) hours as needed. For pain     . GLIPIZIDE 10 MG PO TABS Oral Take 10 mg by mouth daily.    Marland Kitchen GERITOL COMPLETE PO TABS Oral Take 1 tablet by mouth daily.    Marland Kitchen METFORMIN HCL 500 MG PO TABS Oral Take 500-1,000 mg by mouth 2 (two) times daily with a meal. Take 1000mg  in the am & 500mg  at night      BP 150/88  Pulse 102  Temp 99.1 F (37.3 C) (Oral)  Resp 18  SpO2 97%  Physical Exam  Nursing note and vitals reviewed. Constitutional: She appears well-developed and well-nourished. No distress.  HENT:  Head: Normocephalic and atraumatic.  Right Ear: External ear normal.  Left Ear: External  ear normal.  Eyes: Conjunctivae are normal. Right eye exhibits no discharge. Left eye exhibits no discharge. No scleral icterus.  Neck: Neck supple. No tracheal deviation present.  Cardiovascular: Normal rate.   Pulmonary/Chest: Effort normal. No stridor. No respiratory distress.  Musculoskeletal: Normal range of motion. She exhibits no edema.       No lumbar spine tenderness to palpation. Right foot appears grossly normal. No swelling or tenderness. Motor and sensation appears intact.   Neurological: She is alert. Cranial nerve deficit: no gross deficits.       DTRs are trace and equal in bilateral lower extremities.   Skin: Skin is warm and dry. No rash noted.  Psychiatric: She has a normal mood and affect.    ED Course  Procedures (including critical care time) DIAGNOSTIC STUDIES: Oxygen Saturation is 97% on room air, normal by my interpretation.    COORDINATION OF CARE: 18:53- Evaluated Pt. Pt is awake, alert, and oriented.   Labs Reviewed - No data to display No results found.   No diagnosis found.    MDM  The patient presents with numbness of the right foot, the cause of which is suspicious for a lumbar radiculopathy.  There is  no weakness or bowel or bladder complaint.  She will be treated with prednisone, follow up with pcp to discuss further testing if not improving in one week.     I personally performed the services described in this documentation, which was scribed in my presence. The recorded information has been reviewed and considered.        Veryl Speak, MD 03/26/12 0600

## 2012-04-21 ENCOUNTER — Encounter (HOSPITAL_COMMUNITY): Payer: Self-pay | Admitting: Nurse Practitioner

## 2012-04-21 ENCOUNTER — Emergency Department (HOSPITAL_COMMUNITY)
Admission: EM | Admit: 2012-04-21 | Discharge: 2012-04-21 | Discharge status: Against Medical Advice | Payer: 59 | Attending: Emergency Medicine | Admitting: Emergency Medicine

## 2012-04-21 DIAGNOSIS — M79609 Pain in unspecified limb: Secondary | ICD-10-CM | POA: Insufficient documentation

## 2012-04-21 DIAGNOSIS — R209 Unspecified disturbances of skin sensation: Secondary | ICD-10-CM | POA: Insufficient documentation

## 2012-04-21 NOTE — ED Notes (Signed)
C/o R foot swelling constant for past few months, at times her toes feel numb. Was seen here for same and prescribed dose pack of steroids which she completed with no relief. Ambulatory, cms intact

## 2012-05-18 ENCOUNTER — Emergency Department (HOSPITAL_COMMUNITY): Payer: 59

## 2012-05-18 ENCOUNTER — Emergency Department (HOSPITAL_COMMUNITY)
Admission: EM | Admit: 2012-05-18 | Discharge: 2012-05-18 | Disposition: A | Discharge status: Home / Self Care | Payer: 59 | Attending: Emergency Medicine | Admitting: Emergency Medicine

## 2012-05-18 ENCOUNTER — Encounter (HOSPITAL_COMMUNITY): Payer: Self-pay | Admitting: Family Medicine

## 2012-05-18 DIAGNOSIS — I272 Pulmonary hypertension, unspecified: Secondary | ICD-10-CM

## 2012-05-18 DIAGNOSIS — I270 Primary pulmonary hypertension: Secondary | ICD-10-CM | POA: Insufficient documentation

## 2012-05-18 DIAGNOSIS — E119 Type 2 diabetes mellitus without complications: Secondary | ICD-10-CM | POA: Insufficient documentation

## 2012-05-18 DIAGNOSIS — R05 Cough: Secondary | ICD-10-CM | POA: Insufficient documentation

## 2012-05-18 DIAGNOSIS — R0602 Shortness of breath: Secondary | ICD-10-CM | POA: Insufficient documentation

## 2012-05-18 DIAGNOSIS — Z791 Long term (current) use of non-steroidal anti-inflammatories (NSAID): Secondary | ICD-10-CM | POA: Insufficient documentation

## 2012-05-18 DIAGNOSIS — J40 Bronchitis, not specified as acute or chronic: Secondary | ICD-10-CM

## 2012-05-18 DIAGNOSIS — R059 Cough, unspecified: Secondary | ICD-10-CM | POA: Insufficient documentation

## 2012-05-18 LAB — POCT I-STAT, CHEM 8
BUN: 17 mg/dL (ref 6–23)
Calcium, Ion: 1.2 mmol/L (ref 1.12–1.23)
Creatinine, Ser: 0.6 mg/dL (ref 0.50–1.10)
Glucose, Bld: 265 mg/dL — ABNORMAL HIGH (ref 70–99)
Sodium: 138 mEq/L (ref 135–145)
TCO2: 22 mmol/L (ref 0–100)

## 2012-05-18 LAB — POCT I-STAT TROPONIN I: Troponin i, poc: 0 ng/mL (ref 0.00–0.08)

## 2012-05-18 LAB — D-DIMER, QUANTITATIVE: D-Dimer, Quant: 0.64 ug/mL-FEU — ABNORMAL HIGH (ref 0.00–0.48)

## 2012-05-18 MED ORDER — IPRATROPIUM BROMIDE 0.02 % IN SOLN
0.5000 mg | Freq: Once | RESPIRATORY_TRACT | Status: AC
  Administered 2012-05-18: 0.5 mg via RESPIRATORY_TRACT
  Filled 2012-05-18: qty 2.5

## 2012-05-18 MED ORDER — IOHEXOL 350 MG/ML SOLN: 90.0000 mL | Freq: Once | INTRAVENOUS | Status: DC | PRN

## 2012-05-18 MED ORDER — AZITHROMYCIN 250 MG PO TABS: 250.0000 mg | ORAL_TABLET | Freq: Every day | ORAL | Status: DC

## 2012-05-18 MED ORDER — ALBUTEROL SULFATE HFA 108 (90 BASE) MCG/ACT IN AERS: 1.0000 | INHALATION_SPRAY | Freq: Four times a day (QID) | RESPIRATORY_TRACT | Status: DC | PRN

## 2012-05-18 MED ORDER — ALBUTEROL SULFATE (5 MG/ML) 0.5% IN NEBU
5.0000 mg | INHALATION_SOLUTION | Freq: Once | RESPIRATORY_TRACT | Status: AC
  Administered 2012-05-18: 5 mg via RESPIRATORY_TRACT
  Filled 2012-05-18: qty 1

## 2012-05-18 NOTE — ED Provider Notes (Signed)
History     CSN: [id number]  Arrival date & time 05/18/12  [id number]   First MD Initiated Contact with Patient 05/18/12 0848      Chief Complaint  Patient presents with  . Chest Pain  . Shortness of Breath     HPI Patient has chief complaint of chest tightness and shortness of breath which got worse last night.  Patient's also had productive cough with yellow sputum.  Patient has no fever.  Patient has no history of PE or DVT.  Denies any thigh pain. Past Medical History  Diagnosis Date  . Diabetes mellitus     History reviewed. No pertinent past surgical history.  No family history on file.  History  Substance Use Topics  . Smoking status: Never Smoker   . Smokeless tobacco: Not on file  . Alcohol Use: No    OB History    Grav Para Term Preterm Abortions TAB SAB Ect Mult Living   1               Review of Systems All other systems reviewed and are negative Allergies  Review of patient's allergies indicates no known allergies.  Home Medications   Current Outpatient Rx  Name Route Sig Dispense Refill  . ACETAMINOPHEN 500 MG PO TABS Oral Take 1,000 mg by mouth every 6 (six) hours as needed. For pain     . LISINOPRIL 10 MG PO TABS Oral Take 10 mg by mouth daily.    Marland Kitchen METFORMIN HCL 1000 MG PO TABS Oral Take 1,000-1,500 mg by mouth 2 (two) times daily with a meal. Takes 1500 mg in the mornings and 1000 mg in the evenings.    . ALBUTEROL SULFATE HFA 108 (90 BASE) MCG/ACT IN AERS Inhalation Inhale 1-2 puffs into the lungs every 6 (six) hours as needed for wheezing. 1 Inhaler 0  . AZITHROMYCIN 250 MG PO TABS Oral Take 1 tablet (250 mg total) by mouth daily. Take 2 tablets on day 1 then take 1 tablet daily until gone 6 tablet 0  . GLIPIZIDE 10 MG PO TABS Oral Take 10 mg by mouth daily.      BP 150/97  Pulse 105  Temp 98.6 F (37 C) (Oral)  Resp 18  SpO2 99%  LMP 05/12/2012  Physical Exam  Nursing note and vitals reviewed. Constitutional: She is oriented to person,  place, and time. She appears well-developed and well-nourished. No distress.  HENT:  Head: Normocephalic and atraumatic.  Eyes: Pupils are equal, round, and reactive to light.  Neck: Normal range of motion.  Cardiovascular: Normal rate and intact distal pulses.   Pulmonary/Chest: No respiratory distress. She has wheezes.  Abdominal: Normal appearance. She exhibits no distension.  Musculoskeletal: Normal range of motion.  Neurological: She is alert and oriented to person, place, and time. No cranial nerve deficit.  Skin: Skin is warm and dry. No rash noted.  Psychiatric: She has a normal mood and affect. Her behavior is normal.    ED Course  Procedures (including critical care time)  Labs Reviewed  D-DIMER, QUANTITATIVE - Abnormal; Notable for the following:    D-Dimer, Quant 0.64 (*)     All other components within normal limits  POCT I-STAT, CHEM 8 - Abnormal; Notable for the following:    Glucose, Bld 265 (*)     All other components within normal limits  POCT I-STAT TROPONIN I   Dg Chest 2 View  05/18/2012  *RADIOLOGY REPORT*  Clinical Data: 31 year old female  chest pain shortness of breath and cough.  CHEST - 2 VIEW  Comparison: 02/11/2011.  Findings: Stable lung volumes.  Cardiac size and mediastinal contours are within normal limits.  Visualized tracheal air column is within normal limits.  No pneumothorax, pulmonary edema, pleural effusion or confluent pulmonary opacity. Stable visualized osseous structures.  IMPRESSION: No acute cardiopulmonary abnormality.   Original Report Authenticated By: Roselyn Reef, M.D.    Ct Angio Chest Pe W/cm &/or Wo Cm  05/18/2012  *RADIOLOGY REPORT*  Clinical Data: Chest pain, shortness of breath.  CT ANGIOGRAPHY CHEST  Technique:  Multidetector CT imaging of the chest using the standard protocol during bolus administration of intravenous contrast. Multiplanar reconstructed images including MIPs were obtained and reviewed to evaluate the vascular  anatomy.  Contrast:  90 ml Omnipaque 350  Comparison: Plain film of earlier in the day.  No prior CT.  Findings: Lung windows demonstrate mild motion degradation.  No airspace opacities.  Soft tissue windows:  The quality of this exam for evaluation of pulmonary embolism is poor.  In addition to patient body habitus, the bolus is poorly timed, with contrast centered in the SVC. There is no central pulmonary embolism.  No large lobar embolism. Smaller emboli cannot be excluded.  Pulmonary outflow tract is enlarged, measuring 3.2 cm.  Normal thoracic aortic caliber.  Mild cardiomegaly, with trace bilateral pleural fluid. No mediastinal or hilar adenopathy.  Limited abdominal imaging demonstrates no significant findings.  No acute osseous abnormality.  IMPRESSION:  1.  Poor quality evaluation for pulmonary embolism.  Combination of patient body habitus and poor bolus timing limits exam.  No evidence of embolism to the large lobar level. 2. Pulmonary artery enlargement suggests pulmonary arterial hypertension. 3.  Mild cardiomegaly with trace bilateral pleural fluid.   Original Report Authenticated By: Abigail Miyamoto, M.D.      1. Bronchitis   2. Pulmonary hypertension       MDM         Dot Lanes, MD 05/18/12 1105

## 2012-05-18 NOTE — ED Notes (Signed)
Patient transported to CT 

## 2012-05-18 NOTE — ED Notes (Signed)
Pt. Started last night with chest tightness and SOB. States Chest tightness and SOB gets worse with ambulation. Has non- productive cough.  Started Lisinopril last Thursday.

## 2012-05-18 NOTE — ED Notes (Signed)
Patient transported to X-ray 

## 2012-08-21 ENCOUNTER — Encounter (HOSPITAL_COMMUNITY): Payer: Self-pay | Admitting: *Deleted

## 2012-08-21 ENCOUNTER — Emergency Department (HOSPITAL_COMMUNITY)
Admission: EM | Admit: 2012-08-21 | Discharge: 2012-08-22 | Disposition: A | Discharge status: Home / Self Care | Payer: 59 | Attending: Emergency Medicine | Admitting: Emergency Medicine

## 2012-08-21 DIAGNOSIS — R350 Frequency of micturition: Secondary | ICD-10-CM | POA: Insufficient documentation

## 2012-08-21 DIAGNOSIS — A64 Unspecified sexually transmitted disease: Secondary | ICD-10-CM | POA: Insufficient documentation

## 2012-08-21 DIAGNOSIS — Z79899 Other long term (current) drug therapy: Secondary | ICD-10-CM | POA: Insufficient documentation

## 2012-08-21 DIAGNOSIS — E119 Type 2 diabetes mellitus without complications: Secondary | ICD-10-CM | POA: Insufficient documentation

## 2012-08-21 DIAGNOSIS — Z8619 Personal history of other infectious and parasitic diseases: Secondary | ICD-10-CM | POA: Insufficient documentation

## 2012-08-21 DIAGNOSIS — N898 Other specified noninflammatory disorders of vagina: Secondary | ICD-10-CM | POA: Insufficient documentation

## 2012-08-21 DIAGNOSIS — R300 Dysuria: Secondary | ICD-10-CM | POA: Insufficient documentation

## 2012-08-21 DIAGNOSIS — Z3202 Encounter for pregnancy test, result negative: Secondary | ICD-10-CM | POA: Insufficient documentation

## 2012-08-21 LAB — WET PREP, GENITAL: Trich, Wet Prep: NONE SEEN

## 2012-08-21 LAB — URINALYSIS, ROUTINE W REFLEX MICROSCOPIC
Leukocytes, UA: NEGATIVE
Protein, ur: 30 mg/dL — AB
Urobilinogen, UA: 0.2 mg/dL (ref 0.0–1.0)

## 2012-08-21 LAB — POCT PREGNANCY, URINE: Preg Test, Ur: NEGATIVE

## 2012-08-21 MED ORDER — LIDOCAINE HCL 2 % EX GEL
Freq: Once | CUTANEOUS | Status: DC
  Filled 2012-08-21: qty 20

## 2012-08-21 NOTE — ED Notes (Signed)
Pt states that she has been having burning sensation for the past couple of days in her pelvic area. Pt states today the burning pain got worse. Pt has burning with urinating. Pt has discharge.

## 2012-08-21 NOTE — ED Provider Notes (Signed)
History     CSN: [id number]  Arrival date & time 08/21/12  2121   First MD Initiated Contact with Patient 08/21/12 2319      Chief Complaint  Patient presents with  . Pelvic Pain    (Consider location/radiation/quality/duration/timing/severity/associated sxs/prior treatment) HPI Comments: Jacqueline [name] is a 32 y.o. female with a history of diabetes and herpes outbreak presents to the emergency department with chief complaint of vaginal pain and discharge.  Onset of symptoms began approximately 2 days ago and her were described as starting as a burning sensation in the labial area that has been gradually worsening.  There is also dysuria and increased frequency.  The vaginal discharge is thick and white.  Patient denies dyspareunia as she has not had sexual intercourse since the symptoms began.   The history is provided by the patient.    Past Medical History  Diagnosis Date  . Diabetes mellitus     History reviewed. No pertinent past surgical history.  History reviewed. No pertinent family history.  History  Substance Use Topics  . Smoking status: Never Smoker   . Smokeless tobacco: Not on file  . Alcohol Use: No    OB History    Grav Para Term Preterm Abortions TAB SAB Ect Mult Living   1               Review of Systems  All other systems reviewed and are negative.    Allergies  Review of patient's allergies indicates no known allergies.  Home Medications   Current Outpatient Rx  Name  Route  Sig  Dispense  Refill  . GLIPIZIDE 10 MG PO TABS   Oral   Take 10 mg by mouth daily.         Marland Kitchen METFORMIN HCL 1000 MG PO TABS   Oral   Take 1,000-1,500 mg by mouth 2 (two) times daily with a meal. Takes 1500 mg in the mornings and 1000 mg in the evenings.           BP 156/98  Pulse 99  Temp 99.3 F (37.4 C) (Oral)  Resp 16  SpO2 100%  Physical Exam  Nursing note and vitals reviewed. Constitutional: She is oriented to person, place, and time. She appears  well-developed and well-nourished. No distress.  HENT:  Head: Normocephalic and atraumatic.  Eyes: Conjunctivae normal and EOM are normal.  Neck: Normal range of motion.  Pulmonary/Chest: Effort normal.  Genitourinary:       Exam performed by Verl Dicker,  exam chaperoned Date: 08/22/2012 Pelvic exam: normal external genitalia without evidence of trauma. VAGINA: intra labial ulcerations w extreme tenderness to palpation CERVIX: purulent discharge from os, cervical motion tenderness absent, cervical os closed; vaginal discharge - white, copious and creamy, Wet prep and DNA probe for chlamydia and GC obtained.   ADNEXA: normal adnexa in size, nontender and no masses UTERUS: uterus is normal size, shape, consistency and nontender.    Musculoskeletal: Normal range of motion.  Neurological: She is alert and oriented to person, place, and time.  Skin: Skin is warm and dry. No rash noted. She is not diaphoretic.  Psychiatric: She has a normal mood and affect. Her behavior is normal.    ED Course  Procedures (including critical care time)  Labs Reviewed  URINALYSIS, ROUTINE W REFLEX MICROSCOPIC - Abnormal; Notable for the following:    APPearance HAZY (*)     Glucose, UA >1000 (*)     Protein, ur 30 (*)  All other components within normal limits  URINE MICROSCOPIC-ADD ON - Abnormal; Notable for the following:    Squamous Epithelial / LPF FEW (*)     Bacteria, UA FEW (*)     All other components within normal limits  WET PREP, GENITAL - Abnormal; Notable for the following:    Clue Cells Wet Prep HPF POC FEW (*)     WBC, Wet Prep HPF POC MANY (*)     All other components within normal limits  POCT PREGNANCY, URINE  URINE CULTURE  GC/CHLAMYDIA PROBE AMP   No results found.   No diagnosis found.    MDM  STD  Patient to be discharged with instructions to follow up with OBGYN. Discussed importance of using protection when sexually active. Pt understands that they have  GC/Chlamydia cultures pending and that they will need to inform all sexual partners if results return positive. Pt has been treated prophylacticly with azithromycin and rocephin due to pts history, pelvic exam, and wet prep with increased WBCs. Pt not concerning for PID because hemodynamically stable and no cervical motion tenderness on pelvic exam. Pt was also treated in the ER with lidocaine jelly adn will be given acyclovir for possible HSV outbreak.        Verl Dicker, [place] 08/22/12 0008

## 2012-08-21 NOTE — ED Notes (Signed)
PA Lisette at bedside performing pelvic exam.

## 2012-08-21 NOTE — ED Notes (Signed)
Pt reports pelvic pain since yesterday, increasing to 10/10 today. Pt had herpes outbreak in 2009 and states "it feels exactly the same." Pt denies taking any rx for herpes. Pt reports dysuria but denies frequency, odor. Pt reports thick vaginal discharge since AM.

## 2012-08-22 LAB — URINE CULTURE

## 2012-08-22 MED ORDER — LIDOCAINE HCL (PF) 1 % IJ SOLN
INTRAMUSCULAR | Status: AC
  Administered 2012-08-22: 2.1 mL
  Filled 2012-08-22: qty 5

## 2012-08-22 MED ORDER — PERCOCET 5-325 MG PO TABS: 1.0000 | ORAL_TABLET | Freq: Four times a day (QID) | ORAL | Status: DC | PRN

## 2012-08-22 MED ORDER — CEFTRIAXONE SODIUM 250 MG IJ SOLR
250.0000 mg | Freq: Once | INTRAMUSCULAR | Status: AC
  Administered 2012-08-22: 250 mg via INTRAMUSCULAR
  Filled 2012-08-22: qty 250

## 2012-08-22 MED ORDER — ACYCLOVIR 400 MG PO TABS: 400.0000 mg | ORAL_TABLET | Freq: Four times a day (QID) | ORAL | Status: DC

## 2012-08-22 MED ORDER — AZITHROMYCIN 250 MG PO TABS
1000.0000 mg | ORAL_TABLET | Freq: Once | ORAL | Status: AC
  Administered 2012-08-22: 1000 mg via ORAL
  Filled 2012-08-22: qty 4

## 2012-08-22 NOTE — ED Provider Notes (Signed)
Medical screening examination/treatment/procedure(s) were performed by non-physician practitioner and as supervising physician I was immediately available for consultation/collaboration.  Teressa Lower, MD 08/22/12 [phone]

## 2014-01-01 DIAGNOSIS — E119 Type 2 diabetes mellitus without complications: Secondary | ICD-10-CM

## 2014-01-01 HISTORY — DX: Type 2 diabetes mellitus without complications: E11.9

## 2015-07-04 ENCOUNTER — Encounter (HOSPITAL_BASED_OUTPATIENT_CLINIC_OR_DEPARTMENT_OTHER): Payer: Self-pay | Admitting: Emergency Medicine

## 2015-07-04 ENCOUNTER — Emergency Department (HOSPITAL_BASED_OUTPATIENT_CLINIC_OR_DEPARTMENT_OTHER)
Admission: EM | Admit: 2015-07-04 | Discharge: 2015-07-05 | Disposition: A | Discharge status: Home / Self Care | Payer: Medicaid Other | Attending: Emergency Medicine | Admitting: Emergency Medicine

## 2015-07-04 DIAGNOSIS — Z79899 Other long term (current) drug therapy: Secondary | ICD-10-CM | POA: Insufficient documentation

## 2015-07-04 DIAGNOSIS — Z7984 Long term (current) use of oral hypoglycemic drugs: Secondary | ICD-10-CM | POA: Insufficient documentation

## 2015-07-04 DIAGNOSIS — Z3202 Encounter for pregnancy test, result negative: Secondary | ICD-10-CM | POA: Insufficient documentation

## 2015-07-04 DIAGNOSIS — I10 Essential (primary) hypertension: Secondary | ICD-10-CM | POA: Diagnosis not present

## 2015-07-04 DIAGNOSIS — E1165 Type 2 diabetes mellitus with hyperglycemia: Secondary | ICD-10-CM | POA: Insufficient documentation

## 2015-07-04 DIAGNOSIS — J209 Acute bronchitis, unspecified: Secondary | ICD-10-CM | POA: Diagnosis not present

## 2015-07-04 DIAGNOSIS — R52 Pain, unspecified: Secondary | ICD-10-CM | POA: Diagnosis present

## 2015-07-04 DIAGNOSIS — R739 Hyperglycemia, unspecified: Secondary | ICD-10-CM

## 2015-07-04 HISTORY — DX: Essential (primary) hypertension: I10

## 2015-07-04 NOTE — ED Notes (Signed)
Patient states that she is having pain all over with most of the pain in her bilateral ears. The patient reports that she has been sick with cold complaints x 2 weeks. Over the last 3 days though she gets dizzy and cant swallow and generalized pain with dizziness

## 2015-07-05 ENCOUNTER — Emergency Department (HOSPITAL_BASED_OUTPATIENT_CLINIC_OR_DEPARTMENT_OTHER): Payer: Medicaid Other

## 2015-07-05 LAB — BASIC METABOLIC PANEL
ANION GAP: 6 (ref 5–15)
BUN: 18 mg/dL (ref 6–20)
CALCIUM: 8.8 mg/dL — AB (ref 8.9–10.3)
CHLORIDE: 99 mmol/L — AB (ref 101–111)
CO2: 26 mmol/L (ref 22–32)
CREATININE: 0.69 mg/dL (ref 0.44–1.00)
GFR calc Af Amer: >60 mL/min (ref >60)
GFR calc non Af Amer: >60 mL/min (ref >60)
GLUCOSE: 463 mg/dL — AB (ref 65–99)
Potassium: 4 mmol/L (ref 3.5–5.1)
Sodium: 131 mmol/L — ABNORMAL LOW (ref 135–145)

## 2015-07-05 LAB — URINE MICROSCOPIC-ADD ON

## 2015-07-05 LAB — CBC WITH DIFFERENTIAL/PLATELET
Basophils Absolute: 0.1 10*3/uL (ref 0.0–0.1)
Basophils Relative: 1 %
Eosinophils Absolute: 0.3 10*3/uL (ref 0.0–0.7)
Eosinophils Relative: 3 %
HEMATOCRIT: 32.4 % — AB (ref 36.0–46.0)
HEMOGLOBIN: 11.2 g/dL — AB (ref 12.0–15.0)
LYMPHS PCT: 39 %
Lymphs Abs: 3.9 10*3/uL (ref 0.7–4.0)
MCH: 27.5 pg (ref 26.0–34.0)
MCHC: 34.6 g/dL (ref 30.0–36.0)
MCV: 79.6 fL (ref 78.0–100.0)
MONO ABS: 0.7 10*3/uL (ref 0.1–1.0)
MONOS PCT: 7 %
NEUTROS ABS: 5.1 10*3/uL (ref 1.7–7.7)
NEUTROS PCT: 50 %
Platelets: 368 10*3/uL (ref 150–400)
RBC: 4.07 MIL/uL (ref 3.87–5.11)
RDW: 12.7 % (ref 11.5–15.5)
WBC: 10.1 10*3/uL (ref 4.0–10.5)

## 2015-07-05 LAB — URINALYSIS, ROUTINE W REFLEX MICROSCOPIC
Bilirubin Urine: NEGATIVE
Ketones, ur: NEGATIVE mg/dL
LEUKOCYTES UA: NEGATIVE
Nitrite: NEGATIVE
PH: 7 (ref 5.0–8.0)
Protein, ur: 30 mg/dL — AB
SPECIFIC GRAVITY, URINE: 1.035 — AB (ref 1.005–1.030)

## 2015-07-05 LAB — CBG MONITORING, ED
GLUCOSE-CAPILLARY: 283 mg/dL — AB (ref 65–99)
GLUCOSE-CAPILLARY: 323 mg/dL — AB (ref 65–99)
GLUCOSE-CAPILLARY: 422 mg/dL — AB (ref 65–99)
Glucose-Capillary: 377 mg/dL — ABNORMAL HIGH (ref 65–99)
Glucose-Capillary: 389 mg/dL — ABNORMAL HIGH (ref 65–99)

## 2015-07-05 LAB — PREGNANCY, URINE: Preg Test, Ur: NEGATIVE

## 2015-07-05 LAB — RAPID STREP SCREEN (MED CTR MEBANE ONLY): Streptococcus, Group A Screen (Direct): NEGATIVE

## 2015-07-05 MED ORDER — SODIUM CHLORIDE 0.9 % IV BOLUS (SEPSIS)
1000.0000 mL | Freq: Once | INTRAVENOUS | Status: AC
  Administered 2015-07-05: 1000 mL via INTRAVENOUS

## 2015-07-05 MED ORDER — FENTANYL CITRATE (PF) 100 MCG/2ML IJ SOLN
100.0000 ug | Freq: Once | INTRAMUSCULAR | Status: AC
  Administered 2015-07-05: 100 ug via INTRAVENOUS
  Filled 2015-07-05: qty 2

## 2015-07-05 MED ORDER — SODIUM CHLORIDE 0.9 % IV SOLN
INTRAVENOUS | Status: DC
  Administered 2015-07-05: 3.2 [IU]/h via INTRAVENOUS
  Filled 2015-07-05: qty 2.5

## 2015-07-05 MED ORDER — ONDANSETRON HCL 4 MG/2ML IJ SOLN
4.0000 mg | Freq: Once | INTRAMUSCULAR | Status: AC
  Administered 2015-07-05: 4 mg via INTRAVENOUS
  Filled 2015-07-05: qty 2

## 2015-07-05 MED ORDER — ALBUTEROL SULFATE HFA 108 (90 BASE) MCG/ACT IN AERS
2.0000 | INHALATION_SPRAY | RESPIRATORY_TRACT | Status: DC | PRN
  Administered 2015-07-05: 2 via RESPIRATORY_TRACT
  Filled 2015-07-05: qty 6.7

## 2015-07-05 MED ORDER — HYDROCODONE-ACETAMINOPHEN 5-325 MG PO TABS: 1.0000 | ORAL_TABLET | Freq: Four times a day (QID) | ORAL | Status: DC | PRN

## 2015-07-05 MED ORDER — HYDROCODONE-ACETAMINOPHEN 5-325 MG PO TABS
1.0000 | ORAL_TABLET | Freq: Once | ORAL | Status: AC
  Administered 2015-07-05: 1 via ORAL
  Filled 2015-07-05: qty 1

## 2015-07-05 NOTE — ED Notes (Signed)
CBG- 422 

## 2015-07-05 NOTE — ED Notes (Signed)
CBG resulted with 377 mg./dcltr.

## 2015-07-05 NOTE — ED Notes (Signed)
Will begin insulin drip after IV NS bolus has infused, will recheck CBG

## 2015-07-05 NOTE — ED Notes (Signed)
Pt ambulates to the bathroom to void.  

## 2015-07-05 NOTE — ED Provider Notes (Signed)
CSN: [id number]     Arrival date & time 07/04/15  2338 History  By signing my name below, I, Terrance Branch, attest that this documentation has been prepared under the direction and in the presence of Shanon Rosser, MD. Electronically Signed: Randa Evens, ED Scribe. 07/05/2015. 5:46 AM.     Chief Complaint  Patient presents with  . Generalized Body Aches   HPI HPI Comments: Jacqueline [name] is a 34 y.o. female who presents to the Emergency Department complaining of intermittent cough onset 2 weeks prior. Pt reports "severe" bilateral ear pain, sore throat (hurts to swallow or even stick out her tongue) and generalized myalgias. She states that she is having chest wall pain, worse when coughing and taking deep breaths. She reports wheezing and SOB as well. Pt doesn't report any alleviating factors or medications tried PTA. She denies abdominal pain, vomiting or diarrhea.  Past Medical History  Diagnosis Date  . Diabetes mellitus   . Hypertension    History reviewed. No pertinent past surgical history. Family History  Problem Relation Age of Onset  . Diabetes Mother   . Hypertension Mother   . Thyroid disease Mother   . Kidney disease Maternal Grandmother   . Diabetes Maternal Grandmother    Social History  Substance Use Topics  . Smoking status: Never Smoker   . Smokeless tobacco: None  . Alcohol Use: No   OB History    Gravida Para Term Preterm AB TAB SAB Ectopic Multiple Living   1              Review of Systems A complete 10 system review of systems was obtained and all systems are negative except as noted in the HPI and PMH.     Allergies  Review of patient's allergies indicates no known allergies.  Home Medications   Prior to Admission medications   Medication Sig Start Date End Date Taking? Authorizing Provider  labetalol (NORMODYNE) 200 MG tablet Take 200 mg by mouth 2 (two) times daily.   Yes Historical Provider, MD  acyclovir (ZOVIRAX) 400 MG tablet Take 1  tablet (400 mg total) by mouth 4 (four) times daily. 08/22/12   Lisette Paz, PA-C  glipiZIDE (GLUCOTROL) 10 MG tablet Take 10 mg by mouth daily.    Historical Provider, MD  metFORMIN (GLUCOPHAGE) 1000 MG tablet Take 1,000-1,500 mg by mouth 2 (two) times daily with a meal. Takes 1500 mg in the mornings and 1000 mg in the evenings.    Historical Provider, MD  PERCOCET 5-325 MG per tablet Take 1 tablet by mouth every 6 (six) hours as needed for pain. 08/22/12   Barnetta Chapel Schinlever, PA-C   BP 128/83 mmHg  Pulse 93  Temp(Src) 98.9 F (37.2 C) (Oral)  Resp 16  Ht 5\' 4"  (1.626 m)  Wt 267 lb (121.11 kg)  BMI 45.81 kg/m2  SpO2 96%  LMP 06/27/2015   Physical Exam General: Well-developed, well-nourished female in no acute distress; appearance consistent with age of record HENT: normocephalic; atraumatic; TM's normal; mild pharyngeal erythema  Eyes: pupils equal, round and reactive to light; extraocular muscles intact Neck: supple Heart: regular rate and rhythm Lungs: Decreased air movement bilaterally without wheezing; coughing on deep breathing Chest: Anterior chest wall tenderness Abdomen: soft; nondistended; nontender; bowel sounds present Extremities: No deformity; full range of motion; pulses normal Neurologic: Awake, alert and oriented; motor function intact in all extremities and symmetric; no facial droop Skin: Warm and dry Psychiatric: Flat affect  ED Course  Procedures (  including critical care time) DIAGNOSTIC STUDIES: Oxygen Saturation is 100% on RA, normal by my interpretation.    COORDINATION OF CARE: 12:39 AM-Discussed treatment plan with pt at bedside and pt agreed to plan.     MDM   Nursing notes and vitals signs, including pulse oximetry, reviewed.  Summary of this visit's results, reviewed by myself:  Labs:  Results for orders placed or performed during the hospital encounter of 07/04/15 (from the past 24 hour(s))  Rapid strep screen (not at [facility])     Status: None    Collection Time: 07/05/15 12:35 AM  Result Value Ref Range   Streptococcus, Group A Screen (Direct) NEGATIVE NEGATIVE  CBG monitoring, ED     Status: Abnormal   Collection Time: 07/05/15 12:43 AM  Result Value Ref Range   Glucose-Capillary 422 (H) 65 - 99 mg/dL  CBC with Differential/Platelet     Status: Abnormal   Collection Time: 07/05/15 12:45 AM  Result Value Ref Range   WBC 10.1 4.0 - 10.5 K/uL   RBC 4.07 3.87 - 5.11 MIL/uL   Hemoglobin 11.2 (L) 12.0 - 15.0 g/dL   HCT 32.4 (L) 36.0 - 46.0 %   MCV 79.6 78.0 - 100.0 fL   MCH 27.5 26.0 - 34.0 pg   MCHC 34.6 30.0 - 36.0 g/dL   RDW 12.7 11.5 - 15.5 %   Platelets 368 150 - 400 K/uL   Neutrophils Relative % 50 %   Neutro Abs 5.1 1.7 - 7.7 K/uL   Lymphocytes Relative 39 %   Lymphs Abs 3.9 0.7 - 4.0 K/uL   Monocytes Relative 7 %   Monocytes Absolute 0.7 0.1 - 1.0 K/uL   Eosinophils Relative 3 %   Eosinophils Absolute 0.3 0.0 - 0.7 K/uL   Basophils Relative 1 %   Basophils Absolute 0.1 0.0 - 0.1 K/uL  Basic metabolic panel     Status: Abnormal   Collection Time: 07/05/15 12:45 AM  Result Value Ref Range   Sodium 131 (L) 135 - 145 mmol/L   Potassium 4.0 3.5 - 5.1 mmol/L   Chloride 99 (L) 101 - 111 mmol/L   CO2 26 22 - 32 mmol/L   Glucose, Bld 463 (H) 65 - 99 mg/dL   BUN 18 6 - 20 mg/dL   Creatinine, Ser 0.69 0.44 - 1.00 mg/dL   Calcium 8.8 (L) 8.9 - 10.3 mg/dL   GFR calc non Af Amer >60 >60 mL/min   GFR calc Af Amer >60 >60 mL/min   Anion gap 6 5 - 15  Urinalysis, Routine w reflex microscopic (not at [facility])     Status: Abnormal   Collection Time: 07/05/15  1:00 AM  Result Value Ref Range   Color, Urine YELLOW YELLOW   APPearance CLEAR CLEAR   Specific Gravity, Urine 1.035 (H) 1.005 - 1.030   pH 7.0 5.0 - 8.0   Glucose, UA >1000 (A) NEGATIVE mg/dL   Hgb urine dipstick TRACE (A) NEGATIVE   Bilirubin Urine NEGATIVE NEGATIVE   Ketones, ur NEGATIVE NEGATIVE mg/dL   Protein, ur 30 (A) NEGATIVE mg/dL   Nitrite NEGATIVE  NEGATIVE   Leukocytes, UA NEGATIVE NEGATIVE  Pregnancy, urine     Status: None   Collection Time: 07/05/15  1:00 AM  Result Value Ref Range   Preg Test, Ur NEGATIVE NEGATIVE  Urine microscopic-add on     Status: Abnormal   Collection Time: 07/05/15  1:00 AM  Result Value Ref Range   Squamous Epithelial / LPF  6-30 (A) NONE SEEN   WBC, UA 0-5 0 - 5 WBC/hpf   RBC / HPF 0-5 0 - 5 RBC/hpf   Bacteria, UA FEW (A) NONE SEEN   Urine-Other MUCOUS PRESENT   POC CBG, ED     Status: Abnormal   Collection Time: 07/05/15  2:10 AM  Result Value Ref Range   Glucose-Capillary 377 (H) 65 - 99 mg/dL  CBG monitoring, ED     Status: Abnormal   Collection Time: 07/05/15  3:46 AM  Result Value Ref Range   Glucose-Capillary 389 (H) 65 - 99 mg/dL  CBG monitoring, ED     Status: Abnormal   Collection Time: 07/05/15  4:42 AM  Result Value Ref Range   Glucose-Capillary 323 (H) 65 - 99 mg/dL  CBG monitoring, ED     Status: Abnormal   Collection Time: 07/05/15  5:42 AM  Result Value Ref Range   Glucose-Capillary 283 (H) 65 - 99 mg/dL    Imaging Studies: Dg Chest 2 View  07/05/2015  CLINICAL DATA:  Intermittent cough onset 2 weeks ago. Year pain, sore throat, generalized myalgias. Chest pain worse with coughing and with deep breaths. Wheezing and shortness of breath. EXAM: CHEST  2 VIEW COMPARISON:  CT chest and chest 05/18/2012 FINDINGS: Shallow inspiration. Borderline heart size and pulmonary vascularity which are likely normal for technique. No focal airspace disease or consolidation in the lungs. No blunting of costophrenic angles. No pneumothorax. Mediastinal contours appear intact. Degenerative changes in the spine. IMPRESSION: No active cardiopulmonary disease. Electronically Signed   By: Lucienne Capers M.D.   On: 07/05/2015 01:17   5:46 AM Patient feeling much better after IV fluid bolus and insulin drip. Sugars now below 300.  I personally performed the services described in this documentation,  which was scribed in my presence. The recorded information has been reviewed and is accurate.     Shanon Rosser, MD 07/05/15 [phone]

## 2015-07-07 LAB — CULTURE, GROUP A STREP: Strep A Culture: NEGATIVE

## 2015-11-23 ENCOUNTER — Emergency Department (HOSPITAL_BASED_OUTPATIENT_CLINIC_OR_DEPARTMENT_OTHER): Payer: BLUE CROSS/BLUE SHIELD

## 2015-11-23 ENCOUNTER — Emergency Department (HOSPITAL_BASED_OUTPATIENT_CLINIC_OR_DEPARTMENT_OTHER)
Admission: EM | Admit: 2015-11-23 | Discharge: 2015-11-23 | Disposition: A | Discharge status: Home / Self Care | Payer: BLUE CROSS/BLUE SHIELD | Attending: Emergency Medicine | Admitting: Emergency Medicine

## 2015-11-23 ENCOUNTER — Encounter (HOSPITAL_BASED_OUTPATIENT_CLINIC_OR_DEPARTMENT_OTHER): Payer: Self-pay | Admitting: *Deleted

## 2015-11-23 DIAGNOSIS — Z79899 Other long term (current) drug therapy: Secondary | ICD-10-CM | POA: Diagnosis not present

## 2015-11-23 DIAGNOSIS — R809 Proteinuria, unspecified: Secondary | ICD-10-CM | POA: Diagnosis not present

## 2015-11-23 DIAGNOSIS — Z794 Long term (current) use of insulin: Secondary | ICD-10-CM | POA: Insufficient documentation

## 2015-11-23 DIAGNOSIS — E1165 Type 2 diabetes mellitus with hyperglycemia: Secondary | ICD-10-CM | POA: Insufficient documentation

## 2015-11-23 DIAGNOSIS — R102 Pelvic and perineal pain: Secondary | ICD-10-CM | POA: Diagnosis present

## 2015-11-23 DIAGNOSIS — R739 Hyperglycemia, unspecified: Secondary | ICD-10-CM

## 2015-11-23 DIAGNOSIS — Z7984 Long term (current) use of oral hypoglycemic drugs: Secondary | ICD-10-CM | POA: Insufficient documentation

## 2015-11-23 DIAGNOSIS — I10 Essential (primary) hypertension: Secondary | ICD-10-CM | POA: Insufficient documentation

## 2015-11-23 LAB — URINALYSIS, ROUTINE W REFLEX MICROSCOPIC
BILIRUBIN URINE: NEGATIVE
KETONES UR: NEGATIVE mg/dL
Leukocytes, UA: NEGATIVE
NITRITE: NEGATIVE
PH: 5.5 (ref 5.0–8.0)
Protein, ur: >300 mg/dL — AB
SPECIFIC GRAVITY, URINE: 1.039 — AB (ref 1.005–1.030)

## 2015-11-23 LAB — URINE MICROSCOPIC-ADD ON

## 2015-11-23 LAB — CBC
HCT: 32.6 % — ABNORMAL LOW (ref 36.0–46.0)
HEMOGLOBIN: 11.3 g/dL — AB (ref 12.0–15.0)
MCH: 27.8 pg (ref 26.0–34.0)
MCHC: 34.7 g/dL (ref 30.0–36.0)
MCV: 80.1 fL (ref 78.0–100.0)
PLATELETS: 341 10*3/uL (ref 150–400)
RBC: 4.07 MIL/uL (ref 3.87–5.11)
RDW: 12.7 % (ref 11.5–15.5)
WBC: 9.3 10*3/uL (ref 4.0–10.5)

## 2015-11-23 LAB — COMPREHENSIVE METABOLIC PANEL
ALK PHOS: 61 U/L (ref 38–126)
ALT: 13 U/L — AB (ref 14–54)
ANION GAP: 3 — AB (ref 5–15)
AST: 16 U/L (ref 15–41)
Albumin: 3.3 g/dL — ABNORMAL LOW (ref 3.5–5.0)
BUN: 12 mg/dL (ref 6–20)
CHLORIDE: 103 mmol/L (ref 101–111)
CO2: 26 mmol/L (ref 22–32)
CREATININE: 0.68 mg/dL (ref 0.44–1.00)
Calcium: 8.8 mg/dL — ABNORMAL LOW (ref 8.9–10.3)
Glucose, Bld: 329 mg/dL — ABNORMAL HIGH (ref 65–99)
Potassium: 4.3 mmol/L (ref 3.5–5.1)
SODIUM: 132 mmol/L — AB (ref 135–145)
Total Bilirubin: 1 mg/dL (ref 0.3–1.2)
Total Protein: 7.1 g/dL (ref 6.5–8.1)

## 2015-11-23 LAB — PREGNANCY, URINE: Preg Test, Ur: NEGATIVE

## 2015-11-23 MED ORDER — ONDANSETRON HCL 4 MG/2ML IJ SOLN
4.0000 mg | Freq: Once | INTRAMUSCULAR | Status: AC
  Administered 2015-11-23: 4 mg via INTRAVENOUS
  Filled 2015-11-23: qty 2

## 2015-11-23 MED ORDER — KETOROLAC TROMETHAMINE 30 MG/ML IJ SOLN
30.0000 mg | Freq: Once | INTRAMUSCULAR | Status: AC
  Administered 2015-11-23: 30 mg via INTRAVENOUS
  Filled 2015-11-23: qty 1

## 2015-11-23 MED ORDER — IBUPROFEN 800 MG PO TABS: 800.0000 mg | ORAL_TABLET | Freq: Three times a day (TID) | ORAL | Status: DC

## 2015-11-23 MED FILL — IBUPROFEN 800 MG TABLET: 800 | 7 days supply | Qty: 21 | Fill #0

## 2015-11-23 NOTE — Discharge Instructions (Signed)
Please eat a strict diabetic diet - see attached instructions At your doctors office tomorrow - please talk to them about sugar control and medicines

## 2015-11-23 NOTE — ED Provider Notes (Signed)
CSN: [id number]     Arrival date & time 11/23/15  0857 History   First MD Initiated Contact with Patient 11/23/15 0930     Chief Complaint  Patient presents with  . Flank Pain     (Consider location/radiation/quality/duration/timing/severity/associated sxs/prior Treatment) HPI Comments: The patient is a 35 year old female, she reports a history of having diabetes and high blood pressure, she states that her diabetic medications are both insulin and metformin, she has been taking her medications, she reports that her blood sugar was high last night reading as high as 350. She reports that over the last 4 days she has had increasing right flank pain, she is now having some burning sensation and pain with urination in her lower back, she denies abdominal pain. She does have nausea but no vomiting, no diaphoresis. She does report that her urine has been darker in color. The pain is burning when she urinates however she does have a dull and a sharp stabbing sensation at rest. It is not positional, it is not exertional, she does have some associated chest pain last night and this morning but has no history of cardiac disease.  Patient is a 35 y.o. female presenting with flank pain. The history is provided by the patient.  Flank Pain    Past Medical History  Diagnosis Date  . Diabetes mellitus   . Hypertension    History reviewed. No pertinent past surgical history. Family History  Problem Relation Age of Onset  . Diabetes Mother   . Hypertension Mother   . Thyroid disease Mother   . Kidney disease Maternal Grandmother   . Diabetes Maternal Grandmother    Social History  Substance Use Topics  . Smoking status: Never Smoker   . Smokeless tobacco: Never Used  . Alcohol Use: No   OB History    Gravida Para Term Preterm AB TAB SAB Ectopic Multiple Living   1              Review of Systems  Genitourinary: Positive for flank pain.  All other systems reviewed and are  negative.     Allergies  Review of patient's allergies indicates no known allergies.  Home Medications   Prior to Admission medications   Medication Sig Start Date End Date Taking? Authorizing Provider  insulin glargine (LANTUS) 100 UNIT/ML injection Inject 52 Units into the skin daily.   Yes Historical Provider, MD  labetalol (NORMODYNE) 200 MG tablet Take 200 mg by mouth 3 (three) times daily.    Yes Historical Provider, MD  metFORMIN (GLUCOPHAGE) 1000 MG tablet Take 2,000 mg by mouth daily with breakfast. Takes 1500 mg in the mornings and 1000 mg in the evenings.   Yes Historical Provider, MD  glipiZIDE (GLUCOTROL) 10 MG tablet Take 10 mg by mouth daily.    Historical Provider, MD  HYDROcodone-acetaminophen (NORCO) 5-325 MG tablet Take 1-2 tablets by mouth every 6 (six) hours as needed (for pain). 07/05/15   John Molpus, MD  ibuprofen (ADVIL,MOTRIN) 800 MG tablet Take 1 tablet (800 mg total) by mouth 3 (three) times daily. 11/23/15   Noemi Chapel, MD   BP 156/90 mmHg  Pulse 97  Temp(Src) 98.6 F (37 C) (Oral)  Resp 20  Ht 5\' 4"  (1.626 m)  Wt 272 lb (123.378 kg)  BMI 46.67 kg/m2  SpO2 100%  LMP 11/09/2015 (Approximate) Physical Exam  Constitutional: She appears well-developed and well-nourished.  Tearful, uncomfortable appearing  HENT:  Head: Normocephalic and atraumatic.  Mouth/Throat: Oropharynx is  clear and moist. No oropharyngeal exudate.  Eyes: Conjunctivae and EOM are normal. Pupils are equal, round, and reactive to light. Right eye exhibits no discharge. Left eye exhibits no discharge. No scleral icterus.  No jaundice  Neck: Normal range of motion. Neck supple. No JVD present. No thyromegaly present.  Cardiovascular: Regular rhythm, normal heart sounds and intact distal pulses.  Exam reveals no gallop and no friction rub.   No murmur heard. Mild tachycardia  Pulmonary/Chest: Effort normal and breath sounds normal. No respiratory distress. She has no wheezes. She has no  rales.  Abdominal: Soft. Bowel sounds are normal. She exhibits no distension and no mass. There is no tenderness.  Obese, no abdominal tenderness to palpation, right CVA tenderness is present  Musculoskeletal: Normal range of motion. She exhibits no edema or tenderness.  Lymphadenopathy:    She has no cervical adenopathy.  Neurological: She is alert. Coordination normal.  Skin: Skin is warm and dry. No rash noted. No erythema.  Psychiatric: She has a normal mood and affect. Her behavior is normal.  Nursing note and vitals reviewed.   ED Course  Procedures (including critical care time) Labs Review Labs Reviewed  URINALYSIS, ROUTINE W REFLEX MICROSCOPIC (NOT AT [facility]) - Abnormal; Notable for the following:    Specific Gravity, Urine 1.039 (*)    Glucose, UA >1000 (*)    Hgb urine dipstick MODERATE (*)    Protein, ur >300 (*)    All other components within normal limits  COMPREHENSIVE METABOLIC PANEL - Abnormal; Notable for the following:    Sodium 132 (*)    Glucose, Bld 329 (*)    Calcium 8.8 (*)    Albumin 3.3 (*)    ALT 13 (*)    Anion gap 3 (*)    All other components within normal limits  CBC - Abnormal; Notable for the following:    Hemoglobin 11.3 (*)    HCT 32.6 (*)    All other components within normal limits  URINE MICROSCOPIC-ADD ON - Abnormal; Notable for the following:    Squamous Epithelial / LPF 0-5 (*)    Bacteria, UA MANY (*)    Casts HYALINE CASTS (*)    All other components within normal limits  PREGNANCY, URINE    Imaging Review Ct Renal Stone Study  11/23/2015  CLINICAL DATA:  Right flank pain since 11/19/2015, worsened this morning. No known injury. Initial encounter. EXAM: CT ABDOMEN AND PELVIS WITHOUT CONTRAST TECHNIQUE: Multidetector CT imaging of the abdomen and pelvis was performed following the standard protocol without IV contrast. COMPARISON:  None. FINDINGS: The lung bases are clear.  No pleural or pericardial effusion. No hydronephrosis or  urinary tract stones are seen on the right or left. 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 The gallbladder, liver, spleen, adrenal glands and pancreas all appear normal. The stomach, small and large bowel and appendix appear normal. Mild laxity of the ventral abdominal wall without frank herniation is noted. Uterus and adnexa appear normal. No lymphadenopathy or fluid. No focal bony abnormality is seen. IMPRESSION: Negative for urinary tract stone. No acute finding or abnormality to explain the patient's symptoms. 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 Electronically Signed   By: Inge Rise M.D.   On: 11/23/2015 10:57   I have personally reviewed and evaluated these images and lab results as part of my medical decision-making.    MDM   Final diagnoses:  Pelvic pain in female  Proteinuria  Hyperglycemia  The patient has symptoms that related mostly to the genitourinary tract, would be concerned for kidney stones or pyelonephritis, she does report a history of pyelonephritis in the past. The etiology of the patient's chest pain is less clear, she does have a mild tachycardia but is also tearful and having significant flank pain, I will do further workup for the chest pain at this time since it seems to be secondary to the predominant abdominal source. The patient is in agreement with the beginning of this workup.  Labs explained to pt including sugar and proteinuria - she has f/u tomorrow Pain free on d/c No signs of kidney stone / infection    Noemi Chapel, MD 11/23/15 1254

## 2015-11-23 NOTE — ED Notes (Signed)
Right flank pain since Thursday, worse this morning. C/o intermittent chest pain, worse last night. States she has not taken her bp meds this morning

## 2015-12-22 ENCOUNTER — Emergency Department (HOSPITAL_BASED_OUTPATIENT_CLINIC_OR_DEPARTMENT_OTHER): Payer: BLUE CROSS/BLUE SHIELD

## 2015-12-22 ENCOUNTER — Observation Stay (HOSPITAL_BASED_OUTPATIENT_CLINIC_OR_DEPARTMENT_OTHER)
Admission: EM | Admit: 2015-12-22 | Discharge: 2015-12-23 | Disposition: A | Discharge status: Home / Self Care | Payer: BLUE CROSS/BLUE SHIELD | Attending: Family Medicine | Admitting: Family Medicine

## 2015-12-22 ENCOUNTER — Encounter (HOSPITAL_BASED_OUTPATIENT_CLINIC_OR_DEPARTMENT_OTHER): Payer: Self-pay | Admitting: *Deleted

## 2015-12-22 DIAGNOSIS — R0602 Shortness of breath: Secondary | ICD-10-CM | POA: Insufficient documentation

## 2015-12-22 DIAGNOSIS — I10 Essential (primary) hypertension: Secondary | ICD-10-CM | POA: Diagnosis not present

## 2015-12-22 DIAGNOSIS — R0789 Other chest pain: Secondary | ICD-10-CM | POA: Diagnosis not present

## 2015-12-22 DIAGNOSIS — Z7984 Long term (current) use of oral hypoglycemic drugs: Secondary | ICD-10-CM | POA: Diagnosis not present

## 2015-12-22 DIAGNOSIS — Z6841 Body Mass Index (BMI) 40.0 and over, adult: Secondary | ICD-10-CM | POA: Insufficient documentation

## 2015-12-22 DIAGNOSIS — E669 Obesity, unspecified: Secondary | ICD-10-CM | POA: Insufficient documentation

## 2015-12-22 DIAGNOSIS — Z794 Long term (current) use of insulin: Secondary | ICD-10-CM | POA: Diagnosis not present

## 2015-12-22 DIAGNOSIS — R079 Chest pain, unspecified: Secondary | ICD-10-CM

## 2015-12-22 DIAGNOSIS — E1169 Type 2 diabetes mellitus with other specified complication: Secondary | ICD-10-CM | POA: Diagnosis not present

## 2015-12-22 DIAGNOSIS — E119 Type 2 diabetes mellitus without complications: Secondary | ICD-10-CM | POA: Diagnosis not present

## 2015-12-22 LAB — COMPREHENSIVE METABOLIC PANEL
ALK PHOS: 60 U/L (ref 38–126)
ALT: 13 U/L — AB (ref 14–54)
AST: 17 U/L (ref 15–41)
Albumin: 3.3 g/dL — ABNORMAL LOW (ref 3.5–5.0)
Anion gap: 8 (ref 5–15)
BILIRUBIN TOTAL: 0.6 mg/dL (ref 0.3–1.2)
BUN: 15 mg/dL (ref 6–20)
CALCIUM: 9 mg/dL (ref 8.9–10.3)
CO2: 22 mmol/L (ref 22–32)
CREATININE: 0.86 mg/dL (ref 0.44–1.00)
Chloride: 103 mmol/L (ref 101–111)
Glucose, Bld: 372 mg/dL — ABNORMAL HIGH (ref 65–99)
Potassium: 4 mmol/L (ref 3.5–5.1)
Sodium: 133 mmol/L — ABNORMAL LOW (ref 135–145)
Total Protein: 7 g/dL (ref 6.5–8.1)

## 2015-12-22 LAB — CBC WITH DIFFERENTIAL/PLATELET
Basophils Absolute: 0.1 10*3/uL (ref 0.0–0.1)
Basophils Relative: 1 %
EOS PCT: 3 %
Eosinophils Absolute: 0.3 10*3/uL (ref 0.0–0.7)
HEMATOCRIT: 30.4 % — AB (ref 36.0–46.0)
HEMOGLOBIN: 10.6 g/dL — AB (ref 12.0–15.0)
LYMPHS ABS: 3.2 10*3/uL (ref 0.7–4.0)
LYMPHS PCT: 38 %
MCH: 27.7 pg (ref 26.0–34.0)
MCHC: 34.9 g/dL (ref 30.0–36.0)
MCV: 79.6 fL (ref 78.0–100.0)
Monocytes Absolute: 0.5 10*3/uL (ref 0.1–1.0)
Monocytes Relative: 6 %
Neutro Abs: 4.4 10*3/uL (ref 1.7–7.7)
Neutrophils Relative %: 52 %
PLATELETS: 323 10*3/uL (ref 150–400)
RBC: 3.82 MIL/uL — AB (ref 3.87–5.11)
RDW: 13 % (ref 11.5–15.5)
WBC: 8.4 10*3/uL (ref 4.0–10.5)

## 2015-12-22 LAB — GLUCOSE, CAPILLARY
GLUCOSE-CAPILLARY: 219 mg/dL — AB (ref 65–99)
Glucose-Capillary: 272 mg/dL — ABNORMAL HIGH (ref 65–99)

## 2015-12-22 LAB — TROPONIN I
Troponin I: <0.03 ng/mL (ref <0.031)
Troponin I: <0.03 ng/mL (ref <0.031)

## 2015-12-22 LAB — HCG, SERUM, QUALITATIVE: PREG SERUM: NEGATIVE

## 2015-12-22 LAB — TSH: TSH: 1.199 u[IU]/mL (ref 0.350–4.500)

## 2015-12-22 LAB — BRAIN NATRIURETIC PEPTIDE: B NATRIURETIC PEPTIDE 5: 31.1 pg/mL (ref 0.0–100.0)

## 2015-12-22 LAB — CBG MONITORING, ED: Glucose-Capillary: 325 mg/dL — ABNORMAL HIGH (ref 65–99)

## 2015-12-22 LAB — D-DIMER, QUANTITATIVE: D-Dimer, Quant: 0.39 ug/mL-FEU (ref 0.00–0.50)

## 2015-12-22 MED ORDER — FENTANYL CITRATE (PF) 100 MCG/2ML IJ SOLN
100.0000 ug | Freq: Once | INTRAMUSCULAR | Status: AC
  Administered 2015-12-22: 100 ug via INTRAVENOUS
  Filled 2015-12-22: qty 2

## 2015-12-22 MED ORDER — IBUPROFEN 200 MG PO TABS: 400.0000 mg | ORAL_TABLET | Freq: Four times a day (QID) | ORAL | Status: DC | PRN

## 2015-12-22 MED ORDER — ONDANSETRON HCL 4 MG/2ML IJ SOLN: 4.0000 mg | Freq: Four times a day (QID) | INTRAMUSCULAR | Status: DC | PRN

## 2015-12-22 MED ORDER — LABETALOL HCL 200 MG PO TABS
200.0000 mg | ORAL_TABLET | Freq: Three times a day (TID) | ORAL | Status: DC
  Administered 2015-12-22 – 2015-12-23 (×3): 200 mg via ORAL
  Filled 2015-12-22 (×3): qty 1

## 2015-12-22 MED ORDER — NITROGLYCERIN 0.4 MG SL SUBL
SUBLINGUAL_TABLET | SUBLINGUAL | Status: AC
  Administered 2015-12-22: 20:00:00
  Administered 2015-12-22: 0.4 mg
  Filled 2015-12-22: qty 1

## 2015-12-22 MED ORDER — INSULIN GLARGINE 100 UNIT/ML ~~LOC~~ SOLN
25.0000 [IU] | Freq: Every day | SUBCUTANEOUS | Status: DC
  Administered 2015-12-22: 25 [IU] via SUBCUTANEOUS
  Filled 2015-12-22 (×2): qty 0.25

## 2015-12-22 MED ORDER — ONDANSETRON HCL 4 MG/2ML IJ SOLN
4.0000 mg | Freq: Once | INTRAMUSCULAR | Status: AC
  Administered 2015-12-22: 4 mg via INTRAVENOUS
  Filled 2015-12-22: qty 2

## 2015-12-22 MED ORDER — ASPIRIN EC 325 MG PO TBEC
325.0000 mg | DELAYED_RELEASE_TABLET | Freq: Every day | ORAL | Status: DC
  Administered 2015-12-22: 325 mg via ORAL

## 2015-12-22 MED ORDER — ENOXAPARIN SODIUM 60 MG/0.6ML ~~LOC~~ SOLN
60.0000 mg | SUBCUTANEOUS | Status: DC
  Administered 2015-12-22 – 2015-12-23 (×2): 60 mg via SUBCUTANEOUS
  Filled 2015-12-22 (×2): qty 0.6

## 2015-12-22 MED ORDER — ASPIRIN 81 MG PO CHEW
324.0000 mg | CHEWABLE_TABLET | Freq: Once | ORAL | Status: AC
  Administered 2015-12-22: 324 mg via ORAL
  Filled 2015-12-22: qty 4

## 2015-12-22 MED ORDER — GI COCKTAIL ~~LOC~~
30.0000 mL | Freq: Four times a day (QID) | ORAL | Status: DC | PRN
Start: 2015-12-22 — End: 2015-12-23
  Administered 2015-12-22: 30 mL via ORAL
  Filled 2015-12-22: qty 30

## 2015-12-22 MED ORDER — INSULIN ASPART 100 UNIT/ML ~~LOC~~ SOLN
0.0000 [IU] | Freq: Every day | SUBCUTANEOUS | Status: DC
  Administered 2015-12-22: 2 [IU] via SUBCUTANEOUS

## 2015-12-22 MED ORDER — LORAZEPAM 1 MG PO TABS
1.0000 mg | ORAL_TABLET | Freq: Once | ORAL | Status: AC
  Administered 2015-12-22: 1 mg via ORAL
  Filled 2015-12-22: qty 1

## 2015-12-22 MED ORDER — INSULIN ASPART 100 UNIT/ML ~~LOC~~ SOLN
0.0000 [IU] | Freq: Three times a day (TID) | SUBCUTANEOUS | Status: DC
  Administered 2015-12-22: 8 [IU] via SUBCUTANEOUS
  Administered 2015-12-23: 5 [IU] via SUBCUTANEOUS
  Administered 2015-12-23: 11 [IU] via SUBCUTANEOUS
  Administered 2015-12-23: 3 [IU] via SUBCUTANEOUS

## 2015-12-22 MED ORDER — ACETAMINOPHEN 325 MG PO TABS
650.0000 mg | ORAL_TABLET | ORAL | Status: DC | PRN
  Administered 2015-12-23: 650 mg via ORAL
  Filled 2015-12-22: qty 2

## 2015-12-22 MED ORDER — KETOROLAC TROMETHAMINE 30 MG/ML IJ SOLN
30.0000 mg | Freq: Once | INTRAMUSCULAR | Status: AC
  Administered 2015-12-22: 30 mg via INTRAVENOUS
  Filled 2015-12-22: qty 1

## 2015-12-22 NOTE — ED Notes (Signed)
Chest tightness. Jaw pain.

## 2015-12-22 NOTE — ED Notes (Signed)
Ginger ale given per pt request.

## 2015-12-22 NOTE — ED Notes (Signed)
Chicken TV dinner given per her request.

## 2015-12-22 NOTE — ED Provider Notes (Signed)
CSN: [id number]     Arrival date & time 12/22/15  1042 History   First MD Initiated Contact with Patient 12/22/15 1051     Chief Complaint  Patient presents with  . Chest Pain     (Consider location/radiation/quality/duration/timing/severity/associated sxs/prior Treatment) HPI 35 year old female who presents with chest pain. She has a history of diabetes, hypertension, obesity, and strong family history of cardiac disease. States that yesterday began to notice intermittent chest pain. States that he noticed this primarily at rest, would last for a few minutes, subsiding and then returning. Reports that it was associated with right jaw pain, nausea, and shortness of breath. During this time is also noted some pedal edema, and notes that she feels short of breath when she lays back to sleep. He has not recently had fevers, cough, upper respiratory symptoms. Has had a history of chest discomfort only once before several years ago in the setting of severe stress. Recently noticed that with activity such as chasing her children around she has been feeling more fatigued and with some tightness in her chest. Unsure if her symptoms today are worse with exertional activity. Began to notice that she had recurrent jaw pain and crushing chest pressure this morning at around 8 to 8:30 while dropping her children off at school. Symptoms worsening in severity, constant, and ultimately brought her to the ED for evaluation. Does report feeling sore in her lower extremity muscles, but denies any calf tenderness. Denies recent immobilization, personal history of DVT/PE, strong family history of thromboembolic disease, recent travel, or hormone usage.   Past Medical History  Diagnosis Date  . Diabetes mellitus   . Hypertension    History reviewed. No pertinent past surgical history. Family History  Problem Relation Age of Onset  . Diabetes Mother   . Hypertension Mother   . Thyroid disease Mother   . Kidney  disease Maternal Grandmother   . Diabetes Maternal Grandmother   . Heart attack     Social History  Substance Use Topics  . Smoking status: Never Smoker   . Smokeless tobacco: Never Used  . Alcohol Use: No   OB History    Gravida Para Term Preterm AB TAB SAB Ectopic Multiple Living   1              Review of Systems 10/14 systems reviewed and are negative other than those stated in the HPI    Allergies  Review of patient's allergies indicates no known allergies.  Home Medications   Prior to Admission medications   Medication Sig Start Date End Date Taking? Authorizing Provider  glipiZIDE (GLUCOTROL) 10 MG tablet Take 10 mg by mouth daily.    Historical Provider, MD  HYDROcodone-acetaminophen (NORCO) 5-325 MG tablet Take 1-2 tablets by mouth every 6 (six) hours as needed (for pain). 07/05/15   John Molpus, MD  ibuprofen (ADVIL,MOTRIN) 800 MG tablet Take 1 tablet (800 mg total) by mouth 3 (three) times daily. 11/23/15   Noemi Chapel, MD  insulin glargine (LANTUS) 100 UNIT/ML injection Inject 52 Units into the skin daily.    Historical Provider, MD  labetalol (NORMODYNE) 200 MG tablet Take 200 mg by mouth 3 (three) times daily.     Historical Provider, MD  metFORMIN (GLUCOPHAGE) 1000 MG tablet Take 2,000 mg by mouth daily with breakfast. Takes 1500 mg in the mornings and 1000 mg in the evenings.    Historical Provider, MD   BP 162/93 mmHg  Pulse 98  Temp(Src) 99 F (  37.2 C) (Oral)  Resp 13  Ht 5\' 4"  (1.626 m)  Wt 271 lb (122.925 kg)  BMI 46.49 kg/m2  SpO2 99%  LMP 12/09/2015 Physical Exam Physical Exam  Nursing note and vitals reviewed. Constitutional: Well developed, well nourished, very anxious appearing, but non-toxic, and in no acute distress Head: Normocephalic and atraumatic.  Mouth/Throat: Oropharynx is clear and moist.  Neck: Normal range of motion. Neck supple.  Cardiovascular: Tachycardic rate and regular rhythm.  Trace pedal edema  bilaterally. Pulmonary/Chest: Effort normal and breath sounds normal.  Abdominal: Soft. There is no tenderness. There is no rebound and no guarding.  Musculoskeletal: Normal range of motion.  Neurological: Alert, no facial droop, fluent speech, moves all extremities symmetrically Skin: Skin is warm and dry.  Psychiatric: Cooperative  ED Course  Procedures (including critical care time) Labs Review Labs Reviewed  CBC WITH DIFFERENTIAL/PLATELET - Abnormal; Notable for the following:    RBC 3.82 (*)    Hemoglobin 10.6 (*)    HCT 30.4 (*)    All other components within normal limits  COMPREHENSIVE METABOLIC PANEL - Abnormal; Notable for the following:    Sodium 133 (*)    Glucose, Bld 372 (*)    Albumin 3.3 (*)    ALT 13 (*)    All other components within normal limits  TROPONIN I  HCG, SERUM, QUALITATIVE  D-DIMER, QUANTITATIVE (NOT AT [facility])  BRAIN NATRIURETIC PEPTIDE    Imaging Review Dg Chest 2 View  12/22/2015  CLINICAL DATA:  Chest pain and shortness of breath since yesterday. EXAM: CHEST  2 VIEW COMPARISON:  07/05/2015. FINDINGS: The cardiac silhouette, mediastinal and hilar contours are within normal limits and stable. The lungs are clear. No pleural effusion. The bony thorax is intact. IMPRESSION: No acute cardiopulmonary findings. Electronically Signed   By: Marijo Sanes M.D.   On: 12/22/2015 12:06   I have personally reviewed and evaluated these images and lab results as part of my medical decision-making.   EKG Interpretation   Date/Time:  Tuesday December 22 2015 10:55:47 EDT Ventricular Rate:  106 PR Interval:  146 QRS Duration: 88 QT Interval:  336 QTC Calculation: 446 R Axis:   97 Text Interpretation:  Sinus tachycardia Borderline right axis deviation  Similar to prior EKG 05/18/2012 Confirmed by Damyia Strider MD, Ritchard Paragas [phone]) on  12/22/2015 11:02:27 AM      MDM   Final diagnoses:  Chest pain, unspecified chest pain type    35 year old female who presents with chest  pain and jaw pain. With active symptoms on arrival, and appears very anxious. She is tachycardic with heart rate in the 100s to 110s  and hypertensive. No respiratory distress. Does not appear fluid overloaded on exam, and overall is unremarkable cardiopulmonary exam aside from sinus tachycardia. Remainder of her exam is nonfocal and unremarkable. Her age primarily makes her lower risk patient although she does have risk factors including hypertension, obesity diabetes and family history. However, she does have a suspicious history as she recently has had exertional symptoms. Heart score 3-4 in setting of history.   Her EKG shows sinus tachycardia without acute ischemic changes. Her chest x-ray shows no acute cardiopulmonary processes. Troponin 1 is negative and d-dimer is negative and ruled out for PE given that she is low risk. Borderline low to moderate risk for ACS, but in the setting of her concerning history did discuss this with Dr. Barbaraann Faster from hospitalist service who will admit to telemetry observation for chest pain rule out.  Forde Dandy, MD 12/22/15 1321

## 2015-12-22 NOTE — H&P (Signed)
History and Physical    Jacqueline [name] [id number] DOB: 05-19-1981 DOA: 12/22/2015  Referring MD/NP/PA: MD PCP: [facility]:  Patient coming from: [facility]  Chief Complaint: chest pain  HPI: Jacqueline [name] is a 35 y.o. female with medical history significant of HTN, DM- poorly controlled, and morbid obesity.   Presented to [facility] with c/o chest pain that has been happening on and off for "a while" but yesterday began to notice an increase in chest pain. Primarily at rest, would last for a few minutes, subsiding and then returning. Reports that it was associated with right jaw pain. Has been having LE edema that happens towards the end of her day.  Blood sugars are poorly controlled at home-- never < 160.   + family hx of cardiac disease No fever, no chills, no cough  Review of Systems: all systems reviewed, negative unless stated above in HPI   Past Medical History  Diagnosis Date  . Diabetes mellitus   . Hypertension     History reviewed. No pertinent past surgical history.   reports that she has never smoked. She has never used smokeless tobacco. She reports that she does not drink alcohol or use illicit drugs.  No Known Allergies  Family History  Problem Relation Age of Onset  . Diabetes Mother   . Hypertension Mother   . Thyroid disease Mother   . Kidney disease Maternal Grandmother   . Diabetes Maternal Grandmother   . Heart attack       Prior to Admission medications   Medication Sig Start Date End Date Taking? Authorizing Provider  glipiZIDE (GLUCOTROL) 10 MG tablet Take 10 mg by mouth daily.    Historical Provider, MD  HYDROcodone-acetaminophen (NORCO) 5-325 MG tablet Take 1-2 tablets by mouth every 6 (six) hours as needed (for pain). 07/05/15   John Molpus, MD  ibuprofen (ADVIL,MOTRIN) 800 MG tablet Take 1 tablet (800 mg total) by mouth 3 (three) times daily. 11/23/15   Noemi Chapel, MD  insulin glargine (LANTUS) 100  UNIT/ML injection Inject 52 Units into the skin daily.    Historical Provider, MD  labetalol (NORMODYNE) 200 MG tablet Take 200 mg by mouth 3 (three) times daily.     Historical Provider, MD  metFORMIN (GLUCOPHAGE) 1000 MG tablet Take 2,000 mg by mouth daily with breakfast. Takes 1500 mg in the mornings and 1000 mg in the evenings.    Historical Provider, MD    Physical Exam: Filed Vitals:   12/22/15 1400 12/22/15 1430 12/22/15 1500 12/22/15 1636  BP: 169/96 155/90 158/94 154/89  Pulse: 95 97 101 104  Temp:    98.9 F (37.2 C)  TempSrc:    Oral  Resp: 22 25 22 20   Height:    5\' 4"  (1.626 m)  Weight:    123.696 kg (272 lb 11.2 oz)  SpO2: 97% 97% 99% 100%      Constitutional: NAD, calm, comfortable Filed Vitals:   12/22/15 1400 12/22/15 1430 12/22/15 1500 12/22/15 1636  BP: 169/96 155/90 158/94 154/89  Pulse: 95 97 101 104  Temp:    98.9 F (37.2 C)  TempSrc:    Oral  Resp: 22 25 22 20   Height:    5\' 4"  (1.626 m)  Weight:    123.696 kg (272 lb 11.2 oz)  SpO2: 97% 97% 99% 100%   Eyes: PERRL, lids and conjunctivae normal ENMT: Mucous membranes are moist. Posterior pharynx clear of any exudate or lesions.Normal dentition.  Neck: normal, supple, no masses, no thyromegaly Respiratory: clear to auscultation bilaterally, no wheezing, no crackles. Normal respiratory effort. No accessory muscle use.  Cardiovascular: Regular rate and rhythm, no murmurs / rubs / gallops. No extremity edema. 2+ pedal pulses. No carotid bruits.  Abdomen: obese Musculoskeletal: no clubbing / cyanosis. No joint deformity upper and lower extremities. Good ROM, no contractures. Normal muscle tone.  Skin: no rashes, lesions, ulcers. No induration Neurologic: CN 2-12 grossly intact. Sensation intact, DTR normal. Strength 5/5 in all 4.  Psychiatric: Normal judgment and insight. Alert and oriented x 3. Normal mood.     Labs on Admission: I have personally reviewed following labs and imaging  studies  CBC:  Recent Labs Lab 12/22/15 1125  WBC 8.4  NEUTROABS 4.4  HGB 10.6*  HCT 30.4*  MCV 79.6  PLT [id number]   Basic Metabolic Panel:  Recent Labs Lab 12/22/15 1125  NA 133*  K 4.0  CL 103  CO2 22  GLUCOSE 372*  BUN 15  CREATININE 0.86  CALCIUM 9.0   GFR: Estimated Creatinine Clearance: 118.6 mL/min (by C-G formula based on Cr of 0.86). Liver Function Tests:  Recent Labs Lab 12/22/15 1125  AST 17  ALT 13*  ALKPHOS 60  BILITOT 0.6  PROT 7.0  ALBUMIN 3.3*   No results for input(s): LIPASE, AMYLASE in the last 168 hours. No results for input(s): AMMONIA in the last 168 hours. Coagulation Profile: No results for input(s): INR, PROTIME in the last 168 hours. Cardiac Enzymes:  Recent Labs Lab 12/22/15 1125  TROPONINI <0.03   BNP (last 3 results) No results for input(s): PROBNP in the last 8760 hours. HbA1C: No results for input(s): HGBA1C in the last 72 hours. CBG:  Recent Labs Lab 12/22/15 1517  GLUCAP 325*   Lipid Profile: No results for input(s): CHOL, HDL, LDLCALC, TRIG, CHOLHDL, LDLDIRECT in the last 72 hours. Thyroid Function Tests: No results for input(s): TSH, T4TOTAL, FREET4, T3FREE, THYROIDAB in the last 72 hours. Anemia Panel: No results for input(s): VITAMINB12, FOLATE, FERRITIN, TIBC, IRON, RETICCTPCT in the last 72 hours. Urine analysis:    Component Value Date/Time   COLORURINE YELLOW 11/23/2015 0930   APPEARANCEUR CLEAR 11/23/2015 0930   LABSPEC 1.039* 11/23/2015 0930   PHURINE 5.5 11/23/2015 0930   GLUCOSEU >1000* 11/23/2015 0930   HGBUR MODERATE* 11/23/2015 0930   BILIRUBINUR NEGATIVE 11/23/2015 0930   KETONESUR NEGATIVE 11/23/2015 0930   PROTEINUR >300* 11/23/2015 0930   UROBILINOGEN 0.2 08/21/2012 2130   NITRITE NEGATIVE 11/23/2015 0930   LEUKOCYTESUR NEGATIVE 11/23/2015 0930   Sepsis Labs: Invalid input(s): PROCALCITONIN, LACTICIDVEN No results found for this or any previous visit (from the past 240 hour(s)).    Radiological Exams on Admission: Dg Chest 2 View  12/22/2015  CLINICAL DATA:  Chest pain and shortness of breath since yesterday. EXAM: CHEST  2 VIEW COMPARISON:  07/05/2015. FINDINGS: The cardiac silhouette, mediastinal and hilar contours are within normal limits and stable. The lungs are clear. No pleural effusion. The bony thorax is intact. IMPRESSION: No acute cardiopulmonary findings. Electronically Signed   By: Marijo Sanes M.D.   On: 12/22/2015 12:06    EKG: Independently reviewed.sinus tachy at 106  Assessment/Plan Active Problems:   Diabetes (HCC)   Chest pain   HTN (hypertension)   Chest pain -atypical -cycle CE -?GI symptoms- pain in upper part of chest -patient reports low risk stress test in [place] from 1 1/2 years ago -check echo  Poorly controlled DM -check Hgba1C -  TSH -resume lantus at lower dose  HTN -resume home meds   DVT prophylaxis: lovenox Code Status: full Family Communication: patient Disposition Plan: obs- home in AM Consults called:  Admission status: obs   [place] DO [facility] Pager [phone]  If 7PM-7AM, please contact night-coverage [email or web address] Password [email or web address]  12/22/2015, 4:45 PM

## 2015-12-22 NOTE — Progress Notes (Signed)
Patient coming from [facility] for admission for chest pain rule out. Heart score 4. Strong family history of heart disease and heart attacks w/ multiple risk factors and suspicious history. Patient sent to [facility] or a telemetry observation admission.  Linna Darner, MD [facility] 12/22/2015, 1:18 PM

## 2015-12-22 NOTE — ED Notes (Signed)
Pt c/o of nausea, md notified

## 2015-12-23 ENCOUNTER — Observation Stay (HOSPITAL_BASED_OUTPATIENT_CLINIC_OR_DEPARTMENT_OTHER): Payer: BLUE CROSS/BLUE SHIELD

## 2015-12-23 DIAGNOSIS — I10 Essential (primary) hypertension: Secondary | ICD-10-CM

## 2015-12-23 DIAGNOSIS — R079 Chest pain, unspecified: Secondary | ICD-10-CM

## 2015-12-23 DIAGNOSIS — R0789 Other chest pain: Secondary | ICD-10-CM | POA: Diagnosis not present

## 2015-12-23 DIAGNOSIS — E1169 Type 2 diabetes mellitus with other specified complication: Secondary | ICD-10-CM

## 2015-12-23 DIAGNOSIS — R072 Precordial pain: Secondary | ICD-10-CM

## 2015-12-23 DIAGNOSIS — E119 Type 2 diabetes mellitus without complications: Secondary | ICD-10-CM | POA: Diagnosis not present

## 2015-12-23 DIAGNOSIS — R0602 Shortness of breath: Secondary | ICD-10-CM | POA: Diagnosis not present

## 2015-12-23 LAB — ECHOCARDIOGRAM COMPLETE
E decel time: 269 msec
FS: 39 % (ref 28–44)
Height: 64 in
IVS/LV PW RATIO, ED: 1.08
LA ID, A-P, ES: 39 mm
LA vol A4C: 53.7 ml
LA vol index: 29.1 mL/m2
LA vol: 64.9 mL
LADIAMINDEX: 1.75 cm/m2
LDCA: 3.46 cm2
LEFT ATRIUM END SYS DIAM: 39 mm
LV PW d: 12 mm — AB (ref 0.6–1.1)
LV TDI E'LATERAL: 8.59
LV TDI E'MEDIAL: 7.51
LVELAT: 8.59 cm/s
LVOT diameter: 21 mm
MV Dec: 269
MV pk E vel: 0.7 m/s
RV TAPSE: 27.2 mm
Weight: 4363.2 oz

## 2015-12-23 LAB — LIPID PANEL
Cholesterol: 135 mg/dL (ref 0–200)
HDL: 31 mg/dL — ABNORMAL LOW (ref >40)
LDL CALC: 64 mg/dL (ref 0–99)
TRIGLYCERIDES: 202 mg/dL — AB (ref <150)
Total CHOL/HDL Ratio: 4.4 RATIO
VLDL: 40 mg/dL (ref 0–40)

## 2015-12-23 LAB — GLUCOSE, CAPILLARY
GLUCOSE-CAPILLARY: 210 mg/dL — AB (ref 65–99)
GLUCOSE-CAPILLARY: 152 mg/dL — AB (ref 65–99)
Glucose-Capillary: 318 mg/dL — ABNORMAL HIGH (ref 65–99)

## 2015-12-23 MED ORDER — PANTOPRAZOLE SODIUM 40 MG PO TBEC: 40.0000 mg | DELAYED_RELEASE_TABLET | Freq: Every day | ORAL | Status: DC

## 2015-12-23 MED ORDER — PANTOPRAZOLE SODIUM 40 MG PO TBEC
40.0000 mg | DELAYED_RELEASE_TABLET | Freq: Every day | ORAL | Status: DC
  Administered 2015-12-23: 40 mg via ORAL
  Filled 2015-12-23: qty 1

## 2015-12-23 MED ORDER — LOSARTAN POTASSIUM 50 MG PO TABS: 100.0000 mg | ORAL_TABLET | Freq: Every day | ORAL | Status: DC

## 2015-12-23 MED ORDER — LOSARTAN POTASSIUM 100 MG PO TABS: 100.0000 mg | ORAL_TABLET | Freq: Every day | ORAL | Status: DC

## 2015-12-23 MED ORDER — ASPIRIN 81 MG PO CHEW
81.0000 mg | CHEWABLE_TABLET | Freq: Once | ORAL | Status: AC
  Administered 2015-12-23: 81 mg via ORAL
  Filled 2015-12-23: qty 1

## 2015-12-23 MED ORDER — INSULIN GLARGINE 100 UNIT/ML ~~LOC~~ SOLN
35.0000 [IU] | Freq: Every day | SUBCUTANEOUS | Status: DC
  Filled 2015-12-23: qty 0.35

## 2015-12-23 NOTE — Progress Notes (Signed)
Pt c/o midsternal CP 7/10, radiating to right jaw.  EKG shows ST, otherwise normal.  VSS.  O2 at 2L/[place] applied.  2 SL NTG tabs given without relief.  A third NTG was not given d/t low BP.  Forrest Moron, NP notified of the above.  Orders received and implemented.  Jodell Cipro

## 2015-12-23 NOTE — Progress Notes (Signed)
  Echocardiogram 2D Echocardiogram has been performed.  Donata Clay 12/23/2015, 1:07 PM

## 2015-12-23 NOTE — Discharge Summary (Signed)
Physician Discharge Summary   Patient ID: Jacqueline [name] MRN: [id number] DOB/AGE: Sep 02, 1980 35 y.o.  Admit date: 12/22/2015 Discharge date: 12/23/2015  Primary Care Physician:  [facility]  Discharge Diagnoses:    . Atypical Chest pain Uncontrolled diabetes Hypertension  GERD  Consults:  cardiology  Recommendations for Outpatient Follow-up:  1. Patient started on PPI trial. If no improvement, may need outpatient stress test  2. Please repeat CBC/BMET at next visit    DIET: carb modified     Allergies:  No Known Allergies   DISCHARGE MEDICATIONS: Current Discharge Medication List    START taking these medications   Details  losartan (COZAAR) 100 MG tablet Take 1 tablet (100 mg total) by mouth daily. Qty: 30 tablet, Refills: 3    pantoprazole (PROTONIX) 40 MG tablet Take 1 tablet (40 mg total) by mouth daily. Qty: 30 tablet, Refills: 1      CONTINUE these medications which have NOT CHANGED   Details  glipiZIDE (GLUCOTROL) 10 MG tablet Take 10 mg by mouth daily.    insulin glargine (LANTUS) 100 UNIT/ML injection Inject 52 Units into the skin at bedtime.     metFORMIN (GLUCOPHAGE) 1000 MG tablet Take 2,000 mg by mouth daily with breakfast. Takes 1500 mg in the mornings and 1000 mg in the evenings.    HYDROcodone-acetaminophen (NORCO) 5-325 MG tablet Take 1-2 tablets by mouth every 6 (six) hours as needed (for pain). Qty: 10 tablet, Refills: 0      STOP taking these medications     labetalol (NORMODYNE) 200 MG tablet      ibuprofen (ADVIL,MOTRIN) 800 MG tablet          Brief H and P: For complete details please refer to admission H and P, but in brief Patient is a 35 year old female with hypertension, morbid obesity, poorly controlled diabetes presented with chest pain. Patient reported chest pain began a day before while laying down, substernal, radiating to her right jaw, associated with shortness of breath and feeling that she  could not catch her breath. Otherwise no nausea or diaphoresis. The episode lasted for several minutes and reoccurred through the day. Patient reported similar symptoms 1-1/2 years ago and underwent stress test in [place] which was unremarkable. She reported intermittent lower extremity edema typically improves with elevation, denied any orthopnea or PND. Patient was admitted for further workup.  Hospital Course:  Atypical chest pain -Risk factors include CAD, obesity, hypertension, poorly controlled diabetes and family history - Cardiology consulted, recommended PPI trial and a 2-D echocardiogram - 2d echo showed preserved EF, no wall motion abnormalities, grade 2 diastolic dysfunction. Follow-up with cardiology outpatient for further consideration of stress testing  GERD Placed on PPI, recommended to stop NSAIDs  Hypertension BP was elevated on admission 192/97, since receiving labetalol, her blood pressure is in controlled. Cardiology recommended switching to ACE inhibitor or ARB, started on losartan  Diabetes mellitus Continue glipizide, Lantus   Day of Discharge BP 122/69 mmHg  Pulse 98  Temp(Src) 98.7 F (37.1 C) (Oral)  Resp 18  Ht 5\' 4"  (1.626 m)  Wt 123.696 kg (272 lb 11.2 oz)  BMI 46.79 kg/m2  SpO2 100%  LMP 12/09/2015  Physical Exam: General: Alert and awake oriented x3 not in any acute distress. HEENT: anicteric sclera, pupils reactive to light and accommodation CVS: S1-S2 clear no murmur rubs or gallops Chest: clear to auscultation bilaterally, no wheezing rales or rhonchi Abdomen: soft nontender, nondistended, normal bowel sounds Extremities:  no cyanosis, clubbing or edema noted bilaterally Neuro: Cranial nerves II-XII intact, no focal neurological deficits   The results of significant diagnostics from this hospitalization (including imaging, microbiology, ancillary and laboratory) are listed below for reference.    LAB RESULTS: Basic Metabolic  Panel:  Recent Labs Lab 12/22/15 1125  NA 133*  K 4.0  CL 103  CO2 22  GLUCOSE 372*  BUN 15  CREATININE 0.86  CALCIUM 9.0   Liver Function Tests:  Recent Labs Lab 12/22/15 1125  AST 17  ALT 13*  ALKPHOS 60  BILITOT 0.6  PROT 7.0  ALBUMIN 3.3*   No results for input(s): LIPASE, AMYLASE in the last 168 hours. No results for input(s): AMMONIA in the last 168 hours. CBC:  Recent Labs Lab 12/22/15 1125  WBC 8.4  NEUTROABS 4.4  HGB 10.6*  HCT 30.4*  MCV 79.6  PLT 323   Cardiac Enzymes:  Recent Labs Lab 12/22/15 1934 12/22/15 2240  TROPONINI <0.03 <0.03   BNP: Invalid input(s): POCBNP CBG:  Recent Labs Lab 12/23/15 1125 12/23/15 1641  GLUCAP 210* 152*    Significant Diagnostic Studies:  Dg Chest 2 View  12/22/2015  CLINICAL DATA:  Chest pain and shortness of breath since yesterday. EXAM: CHEST  2 VIEW COMPARISON:  07/05/2015. FINDINGS: The cardiac silhouette, mediastinal and hilar contours are within normal limits and stable. The lungs are clear. No pleural effusion. The bony thorax is intact. IMPRESSION: No acute cardiopulmonary findings. Electronically Signed   By: Marijo Sanes M.D.   On: 12/22/2015 12:06    2D ECHO: Study Conclusions  - Left ventricle: The cavity size was normal. There was mild  concentric hypertrophy. Systolic function was vigorous. The  estimated ejection fraction was in the range of 65% to 70%. Wall  motion was normal; there were no regional wall motion  abnormalities. There was an increased relative contribution of  atrial contraction to ventricular filling. Doppler parameters are  consistent with abnormal left ventricular relaxation (grade 1  diastolic dysfunction). - Aortic valve: Trileaflet; normal thickness, mildly calcified  leaflets. - Mitral valve: There was mild regurgitation.   Disposition and Follow-up: Discharge Instructions    Diet Carb Modified    Complete by:  As directed      Increase  activity slowly    Complete by:  As directed             DISPOSITION: home    DISCHARGE FOLLOW-UP Follow-up Information    Follow up with [facility] In 2 weeks.   Why:  for hospital follow-up   Contact information:   [address] [place] [address] [id number]       Follow up with Skeet Latch, MD. Schedule an appointment as soon as possible for a visit in 2 weeks.   Specialty:  Cardiology   Why:  for hospital follow-up   Contact information:   [place] [id number] [phone]        Time spent on Discharge: 52mins   Signed:   RAI,RIPUDEEP M.D. [facility] 12/23/2015, 6:09 PM Pager: [phone]

## 2015-12-23 NOTE — Consult Note (Signed)
Patient ID: Jacqueline [name] MRN: [id number] DOB/AGE: 11-22-1980 35 y.o.  Admit date: 12/22/2015 Primary Physician [facility]  Primary [facility] (new)  Requesting physician: Eulogio Bear, DO  Chief Complaint  Chest pain  HPI: Jacqueline [name] is a 35 year old female with hypertension, morbid obesity and poorly controlled diabetes Who presents with chest pain. Symptoms began yesterday while laying down. She reported substernal chest tightness that radiated to her right jaw. It was associated with shortness of breath and a feeling that she could not catch her breath. There is no associated nausea or diaphoresis. The episode lasted for several minutes and then recurred throughout the day. She reports similar symptoms approximately 1-1/2 years ago. She underwent stress testing at that time in [place] that was reportedly unremarkable. She denies having a sour taste in her mouth when she bends over. She is not typically very physically active but does not endorse exertional symptoms.  Jacqueline [name] also reports intermittent lower extremity edema that has been worse lately. It typically improves with elevation and she denies orthopnea or PND.  Of note, Jacqueline [name] does report a significant family history of coronary artery disease. In fact, her mother is in the hospital with heart disease right now. There is no history of premature coronary artery disease.  Review of Systems: A 12 point review of systems was obtained and was negative with exceptions as noted in history of present illness.  Past Medical History  Diagnosis Date  . Diabetes mellitus   . Hypertension     Medications Prior to Admission  Medication Sig Dispense Refill  . glipiZIDE (GLUCOTROL) 10 MG tablet Take 10 mg by mouth daily.    . insulin glargine (LANTUS) 100 UNIT/ML injection Inject 52 Units into the skin at bedtime.     Marland Kitchen labetalol (NORMODYNE) 200 MG tablet Take 200 mg by mouth 3 (three) times  daily.     . metFORMIN (GLUCOPHAGE) 1000 MG tablet Take 2,000 mg by mouth daily with breakfast. Takes 1500 mg in the mornings and 1000 mg in the evenings.    Marland Kitchen HYDROcodone-acetaminophen (NORCO) 5-325 MG tablet Take 1-2 tablets by mouth every 6 (six) hours as needed (for pain). (Patient not taking: Reported on 12/22/2015) 10 tablet 0  . ibuprofen (ADVIL,MOTRIN) 800 MG tablet Take 1 tablet (800 mg total) by mouth 3 (three) times daily. (Patient not taking: Reported on 12/22/2015) 21 tablet 0     . aspirin EC  325 mg Oral Daily  . enoxaparin (LOVENOX) injection  60 mg Subcutaneous Q24H  . insulin aspart  0-15 Units Subcutaneous TID WC  . insulin aspart  0-5 Units Subcutaneous QHS  . insulin glargine  25 Units Subcutaneous QHS  . labetalol  200 mg Oral TID    Infusions:    No Known Allergies  Social History   Social History  . Marital Status: Married    Spouse Name: N/A  . Number of Children: N/A  . Years of Education: N/A   Occupational History  . Not on file.   Social History Main Topics  . Smoking status: Never Smoker   . Smokeless tobacco: Never Used  . Alcohol Use: No  . Drug Use: No  . Sexual Activity: Yes    Birth Control/ Protection: None   Other Topics Concern  . Not on file   Social History Narrative    Family History  Problem Relation Age of Onset  . Diabetes Mother   . Hypertension Mother   .  Thyroid disease Mother   . Kidney disease Maternal Grandmother   . Diabetes Maternal Grandmother   . Heart attack      PHYSICAL EXAM: Filed Vitals:   12/22/15 2124 12/23/15 0602  BP: 129/80 118/64  Pulse: 101 101  Temp:  99.4 F (37.4 C)  Resp:  20    No intake or output data in the 24 hours ending 12/23/15 0851  General:  Well appearing. No respiratory difficulty.  Morbidly obese. HEENT: normal Neck: supple. no JVD. Carotids 2+ bilat; no bruits. No lymphadenopathy or thryomegaly appreciated. Cor: PMI nondisplaced. Regular rate & rhythm. No rubs, gallops  or murmurs. Lungs: clear Abdomen: soft, nontender, nondistended. No hepatosplenomegaly. No bruits or masses. Good bowel sounds. Extremities: no cyanosis, clubbing, rash, edema Neuro: alert & oriented x 3, cranial nerves grossly intact. moves all 4 extremities w/o difficulty. Affect pleasant.  Results for orders placed or performed during the hospital encounter of 12/22/15 (from the past 24 hour(s))  CBC with Differential     Status: Abnormal   Collection Time: 12/22/15 11:25 AM  Result Value Ref Range   WBC 8.4 4.0 - 10.5 K/uL   RBC 3.82 (L) 3.87 - 5.11 MIL/uL   Hemoglobin 10.6 (L) 12.0 - 15.0 g/dL   HCT 30.4 (L) 36.0 - 46.0 %   MCV 79.6 78.0 - 100.0 fL   MCH 27.7 26.0 - 34.0 pg   MCHC 34.9 30.0 - 36.0 g/dL   RDW 13.0 11.5 - 15.5 %   Platelets 323 150 - 400 K/uL   Neutrophils Relative % 52 %   Neutro Abs 4.4 1.7 - 7.7 K/uL   Lymphocytes Relative 38 %   Lymphs Abs 3.2 0.7 - 4.0 K/uL   Monocytes Relative 6 %   Monocytes Absolute 0.5 0.1 - 1.0 K/uL   Eosinophils Relative 3 %   Eosinophils Absolute 0.3 0.0 - 0.7 K/uL   Basophils Relative 1 %   Basophils Absolute 0.1 0.0 - 0.1 K/uL  Comprehensive metabolic panel     Status: Abnormal   Collection Time: 12/22/15 11:25 AM  Result Value Ref Range   Sodium 133 (L) 135 - 145 mmol/L   Potassium 4.0 3.5 - 5.1 mmol/L   Chloride 103 101 - 111 mmol/L   CO2 22 22 - 32 mmol/L   Glucose, Bld 372 (H) 65 - 99 mg/dL   BUN 15 6 - 20 mg/dL   Creatinine, Ser 0.86 0.44 - 1.00 mg/dL   Calcium 9.0 8.9 - 10.3 mg/dL   Total Protein 7.0 6.5 - 8.1 g/dL   Albumin 3.3 (L) 3.5 - 5.0 g/dL   AST 17 15 - 41 U/L   ALT 13 (L) 14 - 54 U/L   Alkaline Phosphatase 60 38 - 126 U/L   Total Bilirubin 0.6 0.3 - 1.2 mg/dL   GFR calc non Af Amer >60 >60 mL/min   GFR calc Af Amer >60 >60 mL/min   Anion gap 8 5 - 15  Troponin I     Status: None   Collection Time: 12/22/15 11:25 AM  Result Value Ref Range   Troponin I <0.03 <0.031 ng/mL  hCG, serum, qualitative      Status: None   Collection Time: 12/22/15 11:25 AM  Result Value Ref Range   Preg, Serum NEGATIVE NEGATIVE  D-dimer, quantitative (not at [facility])     Status: None   Collection Time: 12/22/15 11:25 AM  Result Value Ref Range   D-Dimer, Quant 0.39 0.00 - 0.50 ug/mL-FEU  Brain natriuretic peptide     Status: None   Collection Time: 12/22/15 11:25 AM  Result Value Ref Range   B Natriuretic Peptide 31.1 0.0 - 100.0 pg/mL  CBG monitoring, ED     Status: Abnormal   Collection Time: 12/22/15  3:17 PM  Result Value Ref Range   Glucose-Capillary 325 (H) 65 - 99 mg/dL   Comment 1 Notify RN    Comment 2 Document in Chart   Troponin I-serum (0, 3, 6 hours)     Status: None   Collection Time: 12/22/15  4:55 PM  Result Value Ref Range   Troponin I <0.03 <0.031 ng/mL  TSH     Status: None   Collection Time: 12/22/15  5:06 PM  Result Value Ref Range   TSH 1.199 0.350 - 4.500 uIU/mL  Glucose, capillary     Status: Abnormal   Collection Time: 12/22/15  6:11 PM  Result Value Ref Range   Glucose-Capillary 272 (H) 65 - 99 mg/dL  Troponin I-serum (0, 3, 6 hours)     Status: None   Collection Time: 12/22/15  7:34 PM  Result Value Ref Range   Troponin I <0.03 <0.031 ng/mL  Glucose, capillary     Status: Abnormal   Collection Time: 12/22/15  9:13 PM  Result Value Ref Range   Glucose-Capillary 219 (H) 65 - 99 mg/dL  Troponin I-serum (0, 3, 6 hours)     Status: None   Collection Time: 12/22/15 10:40 PM  Result Value Ref Range   Troponin I <0.03 <0.031 ng/mL  Lipid panel     Status: Abnormal   Collection Time: 12/23/15  3:57 AM  Result Value Ref Range   Cholesterol 135 0 - 200 mg/dL   Triglycerides 202 (H) <150 mg/dL   HDL 31 (L) >40 mg/dL   Total CHOL/HDL Ratio 4.4 RATIO   VLDL 40 0 - 40 mg/dL   LDL Cholesterol 64 0 - 99 mg/dL  Glucose, capillary     Status: Abnormal   Collection Time: 12/23/15  7:26 AM  Result Value Ref Range   Glucose-Capillary 318 (H) 65 - 99 mg/dL   Dg Chest 2  View  12/22/2015  CLINICAL DATA:  Chest pain and shortness of breath since yesterday. EXAM: CHEST  2 VIEW COMPARISON:  07/05/2015. FINDINGS: The cardiac silhouette, mediastinal and hilar contours are within normal limits and stable. The lungs are clear. No pleural effusion. The bony thorax is intact. IMPRESSION: No acute cardiopulmonary findings. Electronically Signed   By: Marijo Sanes M.D.   On: 12/22/2015 12:06    ECG: Sinus rhythm. Rate 95 bpm.  ASSESSMENT/PLAN:  # Atypical chest pain: Jacqueline [name] certainly has risk factors for coronary artery disease, including morbid obesity, hypertension, poorly controlled diabetes, and family history.  however, given her age and a recent negative stress test, it seems very unlikely that this represents ACS. I'm suspicious that this may be gastroesophageal reflux disease. Would recommend a PPI trial. Agree with ordering an echocardiogram. If this is normal she can be followed up with cardiology as an outpatient for consideration of further stress testing. We will reduce aspirin to 81 mg.  If her echo is normal she does not need to be on this daily, especially if she is going to be taking ibuprofen.   # Hypertension: BP was significantly elevated at admission to 192/97. Since receiving labetalol at her home dosing her blood pressure has been well-controlled, and even low. This vicious that she is not taking  it regularly at home. Given that she is diabetic, would consider switching to an ACE inhibitor or ARB, which would be easier dosing and more renal protective.    Signed: Tykee Heideman C. Oval Linsey, MD, [facility]  12/23/2015, 8:51 AM

## 2015-12-23 NOTE — Progress Notes (Signed)
Inpatient Diabetes Program Recommendations  AACE/ADA: New Consensus Statement on Inpatient Glycemic Control (2015)  Target Ranges:  Prepandial:   less than 140 mg/dL      Peak postprandial:   less than 180 mg/dL (1-2 hours)      Critically ill patients:  140 - 180 mg/dL   Lab Results  Component Value Date   GLUCAP 210* 12/23/2015    Review of Glycemic Control  Inpatient Diabetes Program Recommendations:  Insulin - Basal: Titrate Lantus up to 35 units  Thank you  Raoul Pitch MSN, RN,CDE Inpatient Diabetes Coordinator [phone] (team pager)

## 2015-12-23 NOTE — Progress Notes (Signed)
CP relieved after IV toradol given.  Jacqueline [name]

## 2015-12-23 NOTE — Progress Notes (Signed)
[facility]                                                                              Patient Demographics  Jacqueline [name], is a 35 y.o. female, DOB - [birth year], [id number]  Admit date - 12/22/2015   Admitting Physician Waldemar Dickens, MD  Outpatient Primary MD for the patient is [place]  Outpatient specialists:   LOS -   days    Chief Complaint  Patient presents with  . Chest Pain       Brief summary   Patient is a 35 year old female with hypertension, morbid obesity, poorly controlled diabetes presented with chest pain. Patient reported chest pain began a day before while laying down, substernal, radiating to her right jaw, associated with shortness of breath and feeling that she could not catch her breath. Otherwise no nausea or diaphoresis. The episode lasted for several minutes and reoccurred through the day. Patient reported similar symptoms 1-1/2 years ago and underwent stress test in [place] which was unremarkable. She reported intermittent lower extremity edema typically improves with elevation, denied any orthopnea or PND. Patient was admitted for further workup.   Assessment & Plan   Atypical chest pain -Risk factors include CAD, obesity, hypertension, poorly controlled diabetes and family history - Cardiology consulted, recommended PPI trial and a 2-D echocardiogram - If 2-D echo normal, follow-up with cardiology outpatient for further consideration of stress testing - Recommended reducing aspirin to 81 mg daily, however if to reecho is normal she does not need to be on this daily  GERD Placed on PPI, recommended to stop NSAIDs  Hypertension BP was elevated on admission 192/97, since receiving labetalol, her blood pressure is in controlled. Cardiology recommended switching to ACE inhibitor or ARB  Diabetes mellitus Continue glipizide, Lantus  Code Status:  Full code   DVT Prophylaxis:   Lovenox   Family Communication: Discussed in detail with the patient, all imaging results, lab results explained to the patient   Disposition Plan: awaiting echo  Time Spent in minutes   25 minutes  Procedures:  echo  Consultants:   cardiology  Antimicrobials:      Medications  Scheduled Meds: . enoxaparin (LOVENOX) injection  60 mg Subcutaneous Q24H  . insulin aspart  0-15 Units Subcutaneous TID WC  . insulin aspart  0-5 Units Subcutaneous QHS  . insulin glargine  25 Units Subcutaneous QHS  . labetalol  200 mg Oral TID  . pantoprazole  40 mg Oral Q0600   Continuous Infusions:  PRN Meds:.acetaminophen, gi cocktail, ibuprofen, ondansetron (ZOFRAN) IV   Antibiotics   Anti-infectives    None        Subjective:   Jacqueline [name] was seen and examined today.  Patient denies dizziness, chest pain, shortness of breath, abdominal pain, N/V/D/C, new weakness, numbess, tingling. No acute events overnight.    Objective:   Filed Vitals:   12/22/15 2015 12/22/15 2019 12/22/15 2124 12/23/15 0602  BP: 109/59 90/51 129/80 118/64  Pulse:  107 101 101  Temp:    99.4 F (37.4 C)  TempSrc:    Oral  Resp:  20  Height:      Weight:    123.696 kg (272 lb 11.2 oz)  SpO2:  98%  99%   No intake or output data in the 24 hours ending 12/23/15 1153   Wt Readings from Last 3 Encounters:  12/23/15 123.696 kg (272 lb 11.2 oz)  11/23/15 123.378 kg (272 lb)  07/04/15 121.11 kg (267 lb)     Exam  General: Alert and oriented x 3, NAD  HEENT:  PERRLA, EOMI, Anicteric Sclera, mucous membranes moist.   Neck: Supple, no JVD, no masses  Cardiovascular: S1 S2 auscultated, no rubs, murmurs or gallops. Regular rate and rhythm.  Respiratory: Clear to auscultation bilaterally, no wheezing, rales or rhonchi  Gastrointestinal: Soft, nontender, nondistended, + bowel sounds  Ext: no cyanosis clubbing or edema  Neuro: AAOx3, Cr N's II- XII. Strength 5/5 upper and lower extremities  bilaterally  Skin: No rashes  Psych: Normal affect and demeanor, alert and oriented x3    Data Reviewed:  I have personally reviewed following labs and imaging studies  Micro Results No results found for this or any previous visit (from the past 240 hour(s)).  Radiology Reports Dg Chest 2 View  12/22/2015  CLINICAL DATA:  Chest pain and shortness of breath since yesterday. EXAM: CHEST  2 VIEW COMPARISON:  07/05/2015. FINDINGS: The cardiac silhouette, mediastinal and hilar contours are within normal limits and stable. The lungs are clear. No pleural effusion. The bony thorax is intact. IMPRESSION: No acute cardiopulmonary findings. Electronically Signed   By: Marijo Sanes M.D.   On: 12/22/2015 12:06    Lab Data:  CBC:  Recent Labs Lab 12/22/15 1125  WBC 8.4  NEUTROABS 4.4  HGB 10.6*  HCT 30.4*  MCV 79.6  PLT [id number]   Basic Metabolic Panel:  Recent Labs Lab 12/22/15 1125  NA 133*  K 4.0  CL 103  CO2 22  GLUCOSE 372*  BUN 15  CREATININE 0.86  CALCIUM 9.0   GFR: Estimated Creatinine Clearance: 118.6 mL/min (by C-G formula based on Cr of 0.86). Liver Function Tests:  Recent Labs Lab 12/22/15 1125  AST 17  ALT 13*  ALKPHOS 60  BILITOT 0.6  PROT 7.0  ALBUMIN 3.3*   No results for input(s): LIPASE, AMYLASE in the last 168 hours. No results for input(s): AMMONIA in the last 168 hours. Coagulation Profile: No results for input(s): INR, PROTIME in the last 168 hours. Cardiac Enzymes:  Recent Labs Lab 12/22/15 1125 12/22/15 1655 12/22/15 1934 12/22/15 2240  TROPONINI <0.03 <0.03 <0.03 <0.03   BNP (last 3 results) No results for input(s): PROBNP in the last 8760 hours. HbA1C: No results for input(s): HGBA1C in the last 72 hours. CBG:  Recent Labs Lab 12/22/15 1517 12/22/15 1811 12/22/15 2113 12/23/15 0726 12/23/15 1125  GLUCAP 325* 272* 219* 318* 210*   Lipid Profile:  Recent Labs  12/23/15 0357  CHOL 135  HDL 31*  LDLCALC 64  TRIG 202*   CHOLHDL 4.4   Thyroid Function Tests:  Recent Labs  12/22/15 1706  TSH 1.199   Anemia Panel: No results for input(s): VITAMINB12, FOLATE, FERRITIN, TIBC, IRON, RETICCTPCT in the last 72 hours. Urine analysis:    Component Value Date/Time   COLORURINE YELLOW 11/23/2015 0930   APPEARANCEUR CLEAR 11/23/2015 0930   LABSPEC 1.039* 11/23/2015 0930   PHURINE 5.5 11/23/2015 0930   GLUCOSEU >1000* 11/23/2015 0930   HGBUR MODERATE* 11/23/2015 0930   BILIRUBINUR NEGATIVE 11/23/2015 0930   KETONESUR NEGATIVE 11/23/2015  0930   PROTEINUR >300* 11/23/2015 0930   UROBILINOGEN 0.2 08/21/2012 2130   NITRITE NEGATIVE 11/23/2015 0930   LEUKOCYTESUR NEGATIVE 11/23/2015 0930     RAI,RIPUDEEP M.D. [facility] 12/23/2015, 11:53 AM  Pager: [phone] Between 7am to 7pm - call Pager - [phone]  After 7pm go to [email or web address] - password [email or web address]  Call night coverage person covering after 7pm

## 2015-12-24 LAB — HEMOGLOBIN A1C
HEMOGLOBIN A1C: 11.6 % — AB (ref 4.8–5.6)
MEAN PLASMA GLUCOSE: 286 mg/dL

## 2016-01-06 ENCOUNTER — Encounter (HOSPITAL_COMMUNITY): Payer: Self-pay | Admitting: Emergency Medicine

## 2016-01-06 ENCOUNTER — Emergency Department (HOSPITAL_COMMUNITY): Payer: BLUE CROSS/BLUE SHIELD

## 2016-01-06 ENCOUNTER — Encounter (HOSPITAL_COMMUNITY)
Admission: EM | Disposition: A | Discharge status: Home / Self Care | Payer: Self-pay | Source: Home / Self Care | Attending: Emergency Medicine

## 2016-01-06 ENCOUNTER — Observation Stay (HOSPITAL_COMMUNITY)
Admission: EM | Admit: 2016-01-06 | Discharge: 2016-01-07 | Disposition: A | Discharge status: Home / Self Care | Payer: BLUE CROSS/BLUE SHIELD | Attending: Interventional Cardiology | Admitting: Interventional Cardiology

## 2016-01-06 ENCOUNTER — Other Ambulatory Visit: Payer: Self-pay

## 2016-01-06 DIAGNOSIS — R0789 Other chest pain: Principal | ICD-10-CM | POA: Insufficient documentation

## 2016-01-06 DIAGNOSIS — I200 Unstable angina: Secondary | ICD-10-CM

## 2016-01-06 DIAGNOSIS — E1165 Type 2 diabetes mellitus with hyperglycemia: Secondary | ICD-10-CM | POA: Diagnosis not present

## 2016-01-06 DIAGNOSIS — E119 Type 2 diabetes mellitus without complications: Secondary | ICD-10-CM | POA: Diagnosis not present

## 2016-01-06 DIAGNOSIS — F419 Anxiety disorder, unspecified: Secondary | ICD-10-CM | POA: Diagnosis not present

## 2016-01-06 DIAGNOSIS — E785 Hyperlipidemia, unspecified: Secondary | ICD-10-CM | POA: Diagnosis not present

## 2016-01-06 DIAGNOSIS — Z794 Long term (current) use of insulin: Secondary | ICD-10-CM | POA: Insufficient documentation

## 2016-01-06 DIAGNOSIS — Z8249 Family history of ischemic heart disease and other diseases of the circulatory system: Secondary | ICD-10-CM | POA: Insufficient documentation

## 2016-01-06 DIAGNOSIS — I10 Essential (primary) hypertension: Secondary | ICD-10-CM | POA: Insufficient documentation

## 2016-01-06 DIAGNOSIS — Z6841 Body Mass Index (BMI) 40.0 and over, adult: Secondary | ICD-10-CM | POA: Diagnosis not present

## 2016-01-06 DIAGNOSIS — Z79899 Other long term (current) drug therapy: Secondary | ICD-10-CM | POA: Insufficient documentation

## 2016-01-06 DIAGNOSIS — R079 Chest pain, unspecified: Secondary | ICD-10-CM | POA: Diagnosis present

## 2016-01-06 HISTORY — DX: Unspecified chronic bronchitis: J42

## 2016-01-06 HISTORY — PX: CARDIAC CATHETERIZATION: SHX172

## 2016-01-06 HISTORY — DX: Anxiety disorder, unspecified: F41.9

## 2016-01-06 HISTORY — DX: Morbid (severe) obesity due to excess calories: E66.01

## 2016-01-06 HISTORY — DX: Type 2 diabetes mellitus without complications: E11.9

## 2016-01-06 LAB — GLUCOSE, CAPILLARY
GLUCOSE-CAPILLARY: 283 mg/dL — AB (ref 65–99)
GLUCOSE-CAPILLARY: 325 mg/dL — AB (ref 65–99)

## 2016-01-06 LAB — BASIC METABOLIC PANEL
Anion gap: 9 (ref 5–15)
BUN: 13 mg/dL (ref 6–20)
CALCIUM: 9.5 mg/dL (ref 8.9–10.3)
CO2: 23 mmol/L (ref 22–32)
CREATININE: 0.81 mg/dL (ref 0.44–1.00)
Chloride: 102 mmol/L (ref 101–111)
Glucose, Bld: 422 mg/dL — ABNORMAL HIGH (ref 65–99)
Potassium: 4.6 mmol/L (ref 3.5–5.1)
SODIUM: 134 mmol/L — AB (ref 135–145)

## 2016-01-06 LAB — CBC
HCT: 34.7 % — ABNORMAL LOW (ref 36.0–46.0)
Hemoglobin: 11.8 g/dL — ABNORMAL LOW (ref 12.0–15.0)
MCH: 27 pg (ref 26.0–34.0)
MCHC: 34 g/dL (ref 30.0–36.0)
MCV: 79.4 fL (ref 78.0–100.0)
PLATELETS: 366 10*3/uL (ref 150–400)
RBC: 4.37 MIL/uL (ref 3.87–5.11)
RDW: 13.1 % (ref 11.5–15.5)
WBC: 9.2 10*3/uL (ref 4.0–10.5)

## 2016-01-06 LAB — I-STAT TROPONIN, ED
TROPONIN I, POC: 0 ng/mL (ref 0.00–0.08)
Troponin i, poc: 0 ng/mL (ref 0.00–0.08)

## 2016-01-06 LAB — PROTIME-INR
INR: 1.03 (ref 0.00–1.49)
Prothrombin Time: 13.7 seconds (ref 11.6–15.2)

## 2016-01-06 SURGERY — LEFT HEART CATH AND CORONARY ANGIOGRAPHY

## 2016-01-06 MED ORDER — HEPARIN SODIUM (PORCINE) 1000 UNIT/ML IJ SOLN
INTRAMUSCULAR | Status: DC | PRN
  Administered 2016-01-06: 5000 [IU] via INTRAVENOUS

## 2016-01-06 MED ORDER — LIDOCAINE HCL (PF) 1 % IJ SOLN
INTRAMUSCULAR | Status: AC
  Filled 2016-01-06: qty 30

## 2016-01-06 MED ORDER — ASPIRIN EC 81 MG PO TBEC
81.0000 mg | DELAYED_RELEASE_TABLET | Freq: Every day | ORAL | Status: DC
  Administered 2016-01-07: 81 mg via ORAL
  Filled 2016-01-06: qty 1

## 2016-01-06 MED ORDER — ONDANSETRON HCL 4 MG/2ML IJ SOLN
INTRAMUSCULAR | Status: DC | PRN
  Administered 2016-01-06: 4 mg via INTRAVENOUS

## 2016-01-06 MED ORDER — LOSARTAN POTASSIUM 50 MG PO TABS
100.0000 mg | ORAL_TABLET | Freq: Every day | ORAL | Status: DC
  Administered 2016-01-06 – 2016-01-07 (×2): 100 mg via ORAL
  Filled 2016-01-06 (×2): qty 2

## 2016-01-06 MED ORDER — PANTOPRAZOLE SODIUM 40 MG PO TBEC
40.0000 mg | DELAYED_RELEASE_TABLET | Freq: Every day | ORAL | Status: DC
  Filled 2016-01-06 (×2): qty 1

## 2016-01-06 MED ORDER — VERAPAMIL HCL 2.5 MG/ML IV SOLN
INTRAVENOUS | Status: DC | PRN
  Administered 2016-01-06: 10 mL via INTRA_ARTERIAL

## 2016-01-06 MED ORDER — FENTANYL CITRATE (PF) 100 MCG/2ML IJ SOLN
INTRAMUSCULAR | Status: AC
  Filled 2016-01-06: qty 2

## 2016-01-06 MED ORDER — SODIUM CHLORIDE 0.9 % WEIGHT BASED INFUSION
1.0000 mL/kg/h | INTRAVENOUS | Status: AC
  Administered 2016-01-06: 1 mL/kg/h via INTRAVENOUS

## 2016-01-06 MED ORDER — SODIUM CHLORIDE 0.9% FLUSH: 3.0000 mL | INTRAVENOUS | Status: DC | PRN

## 2016-01-06 MED ORDER — SODIUM CHLORIDE 0.9% FLUSH
3.0000 mL | Freq: Two times a day (BID) | INTRAVENOUS | Status: DC
  Administered 2016-01-06: 21:00:00 3 mL via INTRAVENOUS

## 2016-01-06 MED ORDER — ALPRAZOLAM 0.5 MG PO TABS
0.5000 mg | ORAL_TABLET | Freq: Two times a day (BID) | ORAL | Status: DC | PRN
  Administered 2016-01-06: 0.5 mg via ORAL
  Filled 2016-01-06: qty 1

## 2016-01-06 MED ORDER — GI COCKTAIL ~~LOC~~
30.0000 mL | Freq: Two times a day (BID) | ORAL | Status: DC | PRN
  Administered 2016-01-06: 30 mL via ORAL
  Filled 2016-01-06: qty 30

## 2016-01-06 MED ORDER — MIDAZOLAM HCL 2 MG/2ML IJ SOLN
INTRAMUSCULAR | Status: DC | PRN
  Administered 2016-01-06: 1 mg via INTRAVENOUS

## 2016-01-06 MED ORDER — SODIUM CHLORIDE 0.9 % IV BOLUS (SEPSIS)
1000.0000 mL | Freq: Once | INTRAVENOUS | Status: AC
  Administered 2016-01-06: 1000 mL via INTRAVENOUS

## 2016-01-06 MED ORDER — ASPIRIN 300 MG RE SUPP: 300.0000 mg | RECTAL | Status: DC

## 2016-01-06 MED ORDER — ANGIOPLASTY BOOK
Freq: Once | Status: AC
  Administered 2016-01-06: 20:00:00
  Filled 2016-01-06: qty 1

## 2016-01-06 MED ORDER — ONDANSETRON HCL 4 MG/2ML IJ SOLN
4.0000 mg | Freq: Once | INTRAMUSCULAR | Status: AC
  Administered 2016-01-06: 4 mg via INTRAVENOUS
  Filled 2016-01-06: qty 2

## 2016-01-06 MED ORDER — HYDROMORPHONE HCL 1 MG/ML IJ SOLN
1.0000 mg | Freq: Once | INTRAMUSCULAR | Status: AC
  Administered 2016-01-06: 1 mg via INTRAVENOUS
  Filled 2016-01-06: qty 1

## 2016-01-06 MED ORDER — IOPAMIDOL (ISOVUE-370) INJECTION 76%
INTRAVENOUS | Status: AC
  Filled 2016-01-06: qty 100

## 2016-01-06 MED ORDER — INSULIN GLARGINE 100 UNIT/ML ~~LOC~~ SOLN
52.0000 [IU] | Freq: Every day | SUBCUTANEOUS | Status: DC
  Administered 2016-01-06: 21:00:00 52 [IU] via SUBCUTANEOUS
  Filled 2016-01-06 (×2): qty 0.52

## 2016-01-06 MED ORDER — SODIUM CHLORIDE 0.9 % IV SOLN: 250.0000 mL | INTRAVENOUS | Status: DC | PRN

## 2016-01-06 MED ORDER — ENOXAPARIN SODIUM 40 MG/0.4ML ~~LOC~~ SOLN: 40.0000 mg | SUBCUTANEOUS | Status: DC

## 2016-01-06 MED ORDER — FENTANYL CITRATE (PF) 100 MCG/2ML IJ SOLN
INTRAMUSCULAR | Status: DC | PRN
  Administered 2016-01-06: 50 ug via INTRAVENOUS

## 2016-01-06 MED ORDER — ONDANSETRON HCL 4 MG/2ML IJ SOLN: 4.0000 mg | Freq: Four times a day (QID) | INTRAMUSCULAR | Status: DC | PRN

## 2016-01-06 MED ORDER — ONDANSETRON HCL 4 MG/2ML IJ SOLN
INTRAMUSCULAR | Status: AC
  Filled 2016-01-06: qty 2

## 2016-01-06 MED ORDER — GLIPIZIDE 5 MG PO TABS
10.0000 mg | ORAL_TABLET | Freq: Every day | ORAL | Status: DC
  Administered 2016-01-07: 10 mg via ORAL
  Filled 2016-01-06: qty 2

## 2016-01-06 MED ORDER — HEPARIN SODIUM (PORCINE) 1000 UNIT/ML IJ SOLN
INTRAMUSCULAR | Status: AC
  Filled 2016-01-06: qty 1

## 2016-01-06 MED ORDER — NITROGLYCERIN 0.4 MG SL SUBL: 0.4000 mg | SUBLINGUAL_TABLET | SUBLINGUAL | Status: DC | PRN

## 2016-01-06 MED ORDER — MIDAZOLAM HCL 2 MG/2ML IJ SOLN
INTRAMUSCULAR | Status: AC
  Filled 2016-01-06: qty 2

## 2016-01-06 MED ORDER — LIDOCAINE HCL (PF) 1 % IJ SOLN
INTRAMUSCULAR | Status: DC | PRN
  Administered 2016-01-06: 2 mL

## 2016-01-06 MED ORDER — HEPARIN (PORCINE) IN NACL 2-0.9 UNIT/ML-% IJ SOLN
INTRAMUSCULAR | Status: DC | PRN
  Administered 2016-01-06: 1000 mL

## 2016-01-06 MED ORDER — HEPARIN (PORCINE) IN NACL 2-0.9 UNIT/ML-% IJ SOLN
INTRAMUSCULAR | Status: AC
  Filled 2016-01-06: qty 1000

## 2016-01-06 MED ORDER — ASPIRIN 81 MG PO CHEW: 324.0000 mg | CHEWABLE_TABLET | ORAL | Status: DC

## 2016-01-06 MED ORDER — ASPIRIN 81 MG PO CHEW
324.0000 mg | CHEWABLE_TABLET | Freq: Once | ORAL | Status: AC
  Administered 2016-01-06: 324 mg via ORAL
  Filled 2016-01-06: qty 4

## 2016-01-06 MED ORDER — NITROGLYCERIN 2 % TD OINT
1.0000 [in_us] | TOPICAL_OINTMENT | Freq: Once | TRANSDERMAL | Status: AC
  Administered 2016-01-06: 1 [in_us] via TOPICAL
  Filled 2016-01-06: qty 1

## 2016-01-06 MED ORDER — IOPAMIDOL (ISOVUE-370) INJECTION 76%
INTRAVENOUS | Status: DC | PRN
  Administered 2016-01-06: 50 mL via INTRA_ARTERIAL

## 2016-01-06 MED ORDER — PANTOPRAZOLE SODIUM 40 MG PO TBEC
40.0000 mg | DELAYED_RELEASE_TABLET | Freq: Every day | ORAL | Status: DC
  Administered 2016-01-06 – 2016-01-07 (×2): 40 mg via ORAL

## 2016-01-06 MED ORDER — ACETAMINOPHEN 325 MG PO TABS
650.0000 mg | ORAL_TABLET | ORAL | Status: DC | PRN
  Administered 2016-01-07: 08:00:00 650 mg via ORAL
  Filled 2016-01-06: qty 2

## 2016-01-06 SURGICAL SUPPLY — 7 items
CATH OPTITORQUE JACKY 4.0 5F (CATHETERS) ×2 IMPLANT
GLIDESHEATH SLEND SS 6F .021 (SHEATH) ×2 IMPLANT
KIT HEART LEFT (KITS) ×2 IMPLANT
PACK CARDIAC CATHETERIZATION (CUSTOM PROCEDURE TRAY) ×2 IMPLANT
TRANSDUCER W/STOPCOCK (MISCELLANEOUS) ×2 IMPLANT
TUBING CIL FLEX 10 FLL-RA (TUBING) ×2 IMPLANT
WIRE SAFE-T 1.5MM-J .035X260CM (WIRE) ×2 IMPLANT

## 2016-01-06 NOTE — ED Notes (Signed)
Pt. Stated, I started having right chest pain with left shoulder and jaw pain since last night. Same symptoms happen about 2 weeks ago

## 2016-01-06 NOTE — Progress Notes (Signed)
Patient complains of sternal chest pain, increases with deep breathing slightly, no change with palpation or movement. Rates pain 7/10 and states it is similar to pain experienced earlier today. No shortness of breath, nausea, patient not diaphoretic. Notified Jettie Booze PA and patient will be given xanax and GI cocktail as instructed.

## 2016-01-06 NOTE — Consult Note (Deleted)
CARDIOLOGY CONSULT NOTE   Patient IDJulayne [name] MRN: [id number] DOB/AGE: [birth year] 35 y.o.  Admit date: 01/06/2016  Primary Physician   [place] Primary Cardiologist: New, seen by Dr. Oval Linsey in hospital this month Requesting MD: Dr. Eliseo Squires Reason for Consultation: Chest pain   HPI: Jacqueline [name] is a 35 year old female with a past medical history of hypertension, obesity and poorly controlled DM.   She presents today to the ED with Chest pain that awakened her from sleep this morning around 2 AM. She describes the pain as a squeezing in the center of her chest, with radiation to her left arm. Pain lasted for about 4-5 minutes. She drifted off back to sleep, but was awakened again with the same pain, this time with associated diaphoresis and nausea. She also notes that yesterday morning around 4 AM she was awakened from sleep with chest pain, diaphoresis, and nausea. She has had intermittent pain since this morning that is moderately relieved with Nitropaste. She notes that since arriving to the hospital she has had interscapular pain as well.  She was recently admitted this month for chest pain observation. The pain she is having today is much worse than the pain earlier this month. When she was seen in consultation by cardiology earlier this month the plan was for her to have an outpatient stress test. She has not had a chance to schedule this.  Of note she does have a family history of coronary artery disease. Her mother had a MI last year and did have a stent placed. Her maternal grandmother also had coronary artery disease. She is not a smoker, denies EtOH use. She has poorly controlled diabetes, last A1c was 11.6.  She states she has had a stress test in the past but it was many years [place]. She does not remember the results. She had echocardiogram earlier this month, EF was 65-70% with no wall motion abnormalities. There was some diastolic  dysfunction. EKG shows normal sinus rhythm with no concerns for active ischemia. Troponin is negative 2.    Past Medical History  Diagnosis Date  . Diabetes mellitus   . Hypertension      History reviewed. No pertinent past surgical history.  No Known Allergies  I have reviewed the patient's current medications     Prior to Admission medications   Medication Sig Start Date End Date Taking? Authorizing Provider  glipiZIDE (GLUCOTROL) 10 MG tablet Take 10 mg by mouth daily.   Yes Historical Provider, MD  insulin glargine (LANTUS) 100 UNIT/ML injection Inject 52 Units into the skin at bedtime.    Yes Historical Provider, MD  losartan (COZAAR) 100 MG tablet Take 1 tablet (100 mg total) by mouth daily. 12/23/15  Yes Ripudeep Krystal Eaton, MD  metFORMIN (GLUCOPHAGE) 1000 MG tablet Take 1,000-1,500 mg by mouth daily with breakfast. Takes 1500 mg in the mornings and 1000 mg in the evenings.   Yes Historical Provider, MD  pantoprazole (PROTONIX) 40 MG tablet Take 1 tablet (40 mg total) by mouth daily. 12/23/15  Yes Ripudeep Krystal Eaton, MD     Social History   Social History  . Marital Status: Married    Spouse Name: N/A  . Number of Children: N/A  . Years of Education: N/A   Occupational History  . Not on file.   Social History Main Topics  . Smoking status: Never Smoker   . Smokeless tobacco: Never Used  .  Alcohol Use: No  . Drug Use: No  . Sexual Activity: Yes    Birth Control/ Protection: None   Other Topics Concern  . Not on file   Social History Narrative    Family Status  Relation Status Death Age  . Mother Alive   . Sister Alive   . Maternal Grandmother Deceased    Family History  Problem Relation Age of Onset  . Diabetes Mother   . Hypertension Mother   . Thyroid disease Mother   . Kidney disease Maternal Grandmother   . Diabetes Maternal Grandmother   . Heart attack       ROS:  Full 14 point review of systems complete and found to be negative unless listed  above.  Physical Exam: Blood pressure 155/91, pulse 114, temperature 99.1 F (37.3 C), temperature source Oral, resp. rate 22, height 5\' 4"  (1.626 m), weight 277 lb (125.646 kg), last menstrual period 01/01/2016, SpO2 100 %.  General: Well developed, well nourished,obese female in no acute distress Head: Eyes PERRLA, No xanthomas. Normocephalic and atraumatic, oropharynx without edema or  Lungs: CTA Heart: HRRR S1 S2, no rub/gallop, no murmur. pulses are 2+ extrem.   Neck: No carotid bruits. No lymphadenopathy.  No JVD. Abdomen: Bowel sounds present, abdomen soft and non-tender without masses or hernias noted. Msk:  No spine or cva tenderness. No weakness, no joint deformities or effusions. Extremities: No clubbing or cyanosis.  +1 pre tibial edema.  Neuro: Alert and oriented X 3. No focal deficits noted. Psych:  Good affect, responds appropriately Skin: No rashes or lesions noted.  Labs:   Lab Results  Component Value Date   WBC 9.2 01/06/2016   HGB 11.8* 01/06/2016   HCT 34.7* 01/06/2016   MCV 79.4 01/06/2016   PLT 366 01/06/2016   Recent Labs Lab 01/06/16 0921  NA 134*  K 4.6  CL 102  CO2 23  BUN 13  CREATININE 0.81  CALCIUM 9.5  GLUCOSE 422*    Recent Labs  01/06/16 0926 01/06/16 1133  TROPIPOC 0.00 0.00   B NATRIURETIC PEPTIDE  Date/Time Value Ref Range Status  12/22/2015 11:25 AM 31.1 0.0 - 100.0 pg/mL Final   Lab Results  Component Value Date   CHOL 135 12/23/2015   HDL 31* 12/23/2015   LDLCALC 64 12/23/2015   TRIG 202* 12/23/2015   Lab Results  Component Value Date   DDIMER 0.39 12/22/2015    Echo: 12/23/15 Study Conclusions  - Left ventricle: The cavity size was normal. There was mild  concentric hypertrophy. Systolic function was vigorous. The  estimated ejection fraction was in the range of 65% to 70%. Wall  motion was normal; there were no regional wall motion  abnormalities. There was an increased relative contribution of  atrial  contraction to ventricular filling. Doppler parameters are  consistent with abnormal left ventricular relaxation (grade 1  diastolic dysfunction). - Aortic valve: Trileaflet; normal thickness, mildly calcified  leaflets. - Mitral valve: There was mild regurgitation.   ECG: NSR   Radiology:  Dg Chest 2 View  01/06/2016  CLINICAL DATA:  Chest tightness since yesterday, hypertension, diabetes mellitus EXAM: CHEST  2 VIEW COMPARISON:  12/22/2015 FINDINGS: Borderline enlargement of cardiac silhouette. Mediastinal contours and pulmonary vascularity normal. Minimal chronic central peribronchial thickening. No definite infiltrate, pleural effusion or pneumothorax. IMPRESSION: Borderline enlargement of cardiac silhouette with minimal bronchitic changes. No acute infiltrate. Electronically Signed   By: Lavonia Dana M.D.   On: 01/06/2016 10:04  ASSESSMENT AND PLAN:    Active Problems:   * No active hospital problems. *  1. Chest pain : Patient presents with chest pain that awakened her from sleep this morning. She describes the pain as chest tightness and squeezing in the center for her chest that radiates to her left arm, now reporting radiation to her back. She endorses associated shortness of breath, diaphoresis and nausea. EKG is not concerning for any active ischemia, no ST elevation. She has risk factors for coronary artery disease including uncontrolled diabetes, hypertension, and family history.  She was discharged on 12/23/15 after presenting with chest pain with plans for outpatient stress test and a PPI trial. She has taken Protonix daily for 2 weeks as instructed, making this pain less likely to be related to GERD. She will need left heart cath to assess for ischemia.    2. HTN: BP elevated with systolic blood pressures in the 150-160 range. She is on losartan at home. Continue this. Renal function stable. Can add beta blocker for better control, and anginal relief.  3. HLD: LDL is 64.  Not on a statin.     Signed: Arbutus Leas, NP 01/06/2016 1:07 PM Pager [phone]  Co-Sign MD   See H& p  Jettie Booze, MD

## 2016-01-06 NOTE — Progress Notes (Signed)
Patients sister verbalized concern for patient regarding daily crying and being unable to sleep well since January when their aunt past. Spoke with patient and patient easily verbalized feelings. Stated her aunt was the sweetest person, the person she could call anytime and talk about anything with. She was very ill on dialysis with kidney failure for years. Discussed her seeing her PCP and referral to therapy. Patient verbalized agreement and stated she would see her PCP and speak to her about treatment and therapy. Offered services while in the hospital to speak with spiritual counselor and Education officer, museum. Patient stated she would like to see them and consults entered in epic for social work and Teaching laboratory technician. Requested something for sleep from PA, orders received.

## 2016-01-06 NOTE — H&P (Signed)
CARDIOLOGY CONSULT NOTE   Patient IDManaal [name] MRN: [id number] DOB/AGE: April 01, 1981 35 y.o.  Admit date: 01/06/2016  Primary Physician   [place] Primary Cardiologist:  Dr. Oval Linsey  Requesting MD: Dr. Eliseo Squires Reason for Consultation: Chest pain   HPI: Jacqueline [name] is a 35 year old female with a past medical history of hypertension, obesity and poorly controlled DM.   She presents today to the ED with Chest pain that awakened her from sleep this morning around 2 AM. She describes the pain as a squeezing in the center of her chest, with radiation to her left arm. Pain lasted for about 4-5 minutes. She drifted off back to sleep, but was awakened again with the same pain, this time with associated diaphoresis and nausea. She also notes that yesterday morning around 4 AM she was awakened from sleep with chest pain, diaphoresis, and nausea. She has had intermittent pain since this morning that is moderately relieved with Nitropaste. She notes that since arriving to the hospital she has had interscapular pain as well.  She was recently admitted this month for chest pain observation. The pain she is having today is much worse than the pain earlier this month. When she was seen in consultation by cardiology earlier this month the plan was for her to have an outpatient stress test. She has not had a chance to schedule this.  Of note she does have a family history of coronary artery disease. Her mother had a MI last year and did have a stent placed. Her maternal grandmother also had coronary artery disease. She is not a smoker, denies EtOH use. She has poorly controlled diabetes, last A1c was 11.6.  She states she has had a stress test in the past but it was many years ago in another state. She does not remember the results. She had echocardiogram earlier this month, EF was 65-70% with no wall motion abnormalities. There was some diastolic dysfunction. EKG shows normal  sinus rhythm with no concerns for active ischemia. Troponin is negative 2.    Past Medical History  Diagnosis Date  . Diabetes mellitus   . Hypertension      History reviewed. No pertinent past surgical history.  No Known Allergies  I have reviewed the patient's current medications     Prior to Admission medications   Medication Sig Start Date End Date Taking? Authorizing Provider  glipiZIDE (GLUCOTROL) 10 MG tablet Take 10 mg by mouth daily.   Yes Historical Provider, MD  insulin glargine (LANTUS) 100 UNIT/ML injection Inject 52 Units into the skin at bedtime.    Yes Historical Provider, MD  losartan (COZAAR) 100 MG tablet Take 1 tablet (100 mg total) by mouth daily. 12/23/15  Yes Ripudeep Krystal Eaton, MD  metFORMIN (GLUCOPHAGE) 1000 MG tablet Take 1,000-1,500 mg by mouth daily with breakfast. Takes 1500 mg in the mornings and 1000 mg in the evenings.   Yes Historical Provider, MD  pantoprazole (PROTONIX) 40 MG tablet Take 1 tablet (40 mg total) by mouth daily. 12/23/15  Yes Ripudeep Krystal Eaton, MD     Social History   Social History  . Marital Status: Married    Spouse Name: N/A  . Number of Children: N/A  . Years of Education: N/A   Occupational History  . Not on file.   Social History Main Topics  . Smoking status: Never Smoker   . Smokeless tobacco: Never Used  . Alcohol Use: No  .  Drug Use: No  . Sexual Activity: Yes    Birth Control/ Protection: None   Other Topics Concern  . Not on file   Social History Narrative    Family Status  Relation Status Death Age  . Mother Alive   . Sister Alive   . Maternal Grandmother Deceased    Family History  Problem Relation Age of Onset  . Diabetes Mother   . Hypertension Mother   . Thyroid disease Mother   . Kidney disease Maternal Grandmother   . Diabetes Maternal Grandmother   . Heart attack       ROS:  Full 14 point review of systems complete and found to be negative unless listed above.  Physical Exam: Blood  pressure 155/91, pulse 114, temperature 99.1 F (37.3 C), temperature source Oral, resp. rate 22, height 5\' 4"  (1.626 m), weight 277 lb (125.646 kg), last menstrual period 01/01/2016, SpO2 100 %.  General: Well developed, well nourished,obese female in no acute distress Head: Eyes PERRLA, No xanthomas. Normocephalic and atraumatic, oropharynx without edema or  Lungs: CTA Heart: HRRR S1 S2, no rub/gallop, no murmur. pulses are 2+ extrem.   Neck: No carotid bruits. No lymphadenopathy.  No JVD. Abdomen: Bowel sounds present, abdomen soft and non-tender without masses or hernias noted. Msk:  No spine or cva tenderness. No weakness, no joint deformities or effusions. Extremities: No clubbing or cyanosis.  +1 pre tibial edema.  Neuro: Alert and oriented X 3. No focal deficits noted. Psych:  Good affect, responds appropriately Skin: No rashes or lesions noted.  Labs:   Lab Results  Component Value Date   WBC 9.2 01/06/2016   HGB 11.8* 01/06/2016   HCT 34.7* 01/06/2016   MCV 79.4 01/06/2016   PLT 366 01/06/2016   Recent Labs Lab 01/06/16 0921  NA 134*  K 4.6  CL 102  CO2 23  BUN 13  CREATININE 0.81  CALCIUM 9.5  GLUCOSE 422*    Recent Labs  01/06/16 0926 01/06/16 1133  TROPIPOC 0.00 0.00   B NATRIURETIC PEPTIDE  Date/Time Value Ref Range Status  12/22/2015 11:25 AM 31.1 0.0 - 100.0 pg/mL Final   Lab Results  Component Value Date   CHOL 135 12/23/2015   HDL 31* 12/23/2015   LDLCALC 64 12/23/2015   TRIG 202* 12/23/2015   Lab Results  Component Value Date   DDIMER 0.39 12/22/2015    Echo: 12/23/15 Study Conclusions  - Left ventricle: The cavity size was normal. There was mild  concentric hypertrophy. Systolic function was vigorous. The  estimated ejection fraction was in the range of 65% to 70%. Wall  motion was normal; there were no regional wall motion  abnormalities. There was an increased relative contribution of  atrial contraction to ventricular  filling. Doppler parameters are  consistent with abnormal left ventricular relaxation (grade 1  diastolic dysfunction). - Aortic valve: Trileaflet; normal thickness, mildly calcified  leaflets. - Mitral valve: There was mild regurgitation.   ECG: NSR   Radiology:  Dg Chest 2 View  01/06/2016  CLINICAL DATA:  Chest tightness since yesterday, hypertension, diabetes mellitus EXAM: CHEST  2 VIEW COMPARISON:  12/22/2015 FINDINGS: Borderline enlargement of cardiac silhouette. Mediastinal contours and pulmonary vascularity normal. Minimal chronic central peribronchial thickening. No definite infiltrate, pleural effusion or pneumothorax. IMPRESSION: Borderline enlargement of cardiac silhouette with minimal bronchitic changes. No acute infiltrate. Electronically Signed   By: Lavonia Dana M.D.   On: 01/06/2016 10:04    ASSESSMENT AND PLAN:  Active Problems:   * No active hospital problems. *  1. Chest pain : Patient presents with chest pain that awakened her from sleep this morning. She describes the pain as chest tightness and squeezing in the center for her chest that radiates to her left arm, now reporting radiation to her back. She endorses associated shortness of breath, diaphoresis and nausea. EKG is not concerning for any active ischemia, no ST elevation. She has risk factors for coronary artery disease including uncontrolled diabetes, hypertension, and family history.  She was discharged on 12/23/15 after presenting with chest pain with plans for outpatient stress test and a PPI trial. She has taken Protonix daily for 2 weeks as instructed, making this pain less likely to be related to GERD. She will need left heart cath to assess for ischemia.    2. HTN: BP elevated with systolic blood pressures in the 150-160 range. She is on losartan at home. Continue this. Renal function stable. Can add beta blocker for better control, and anginal relief.  3. HLD: LDL is 64. Not on a statin.      Signed: Arbutus Leas, NP 01/06/2016 1:07 PM Pager [phone]  Co-Sign MD  I have examined the patient and reviewed assessment and plan and discussed with patient.  Agree with above as stated.  Recurrent chest discomfort.  Several episodes over the past few weeks.  She had typical chest pain / [place] this AM.  Nuclear stress test will have a high likelihood of false positive result due to her body habitus.  Risks and benefits of cath explained.  She is agreeable.  I explained that longterm, diabetes control would be paramount for her.   Larae Grooms

## 2016-01-06 NOTE — Progress Notes (Signed)
1900 Patient stated she felt better, good result from GI cocktail and xanax. Resting quietly at this time with no complaints.

## 2016-01-06 NOTE — ED Provider Notes (Signed)
CSN: [id number]     Arrival date & time 01/06/16  0915 History   First MD Initiated Contact with Patient 01/06/16 [phone]     Chief Complaint  Patient presents with  . Chest Pain    right side  . Shoulder Pain  . Jaw Pain     (Consider location/radiation/quality/duration/timing/severity/associated sxs/prior Treatment) Patient is a 35 y.o. female presenting with chest pain and shoulder pain. The history is provided by the patient (Patient has had some chest pain left-sided going down her left arm today. she's been having some chest pain off and on for a few weeks).  Chest Pain Pain location:  L chest Pain quality: aching   Radiates to: Left arm. Pain radiates to the back: yes   Pain severity:  Moderate Onset quality:  Gradual Timing:  Intermittent Progression:  Worsening Chronicity:  Recurrent Context: not breathing   Associated symptoms: no abdominal pain, no back pain, no cough, no fatigue and no headache   Shoulder Pain Associated symptoms: no back pain and no fatigue     Past Medical History  Diagnosis Date  . Diabetes mellitus   . Hypertension    History reviewed. No pertinent past surgical history. Family History  Problem Relation Age of Onset  . Diabetes Mother   . Hypertension Mother   . Thyroid disease Mother   . Kidney disease Maternal Grandmother   . Diabetes Maternal Grandmother   . Heart attack     Social History  Substance Use Topics  . Smoking status: Never Smoker   . Smokeless tobacco: Never Used  . Alcohol Use: No   OB History    Gravida Para Term Preterm AB TAB SAB Ectopic Multiple Living   1              Review of Systems  Constitutional: Negative for appetite change and fatigue.  HENT: Negative for congestion, ear discharge and sinus pressure.   Eyes: Negative for discharge.  Respiratory: Negative for cough.   Cardiovascular: Positive for chest pain.  Gastrointestinal: Negative for abdominal pain and diarrhea.  Genitourinary: Negative for  frequency and hematuria.  Musculoskeletal: Negative for back pain.  Skin: Negative for rash.  Neurological: Negative for seizures and headaches.  Psychiatric/Behavioral: Negative for hallucinations.      Allergies  Review of patient's allergies indicates no known allergies.  Home Medications   Prior to Admission medications   Medication Sig Start Date End Date Taking? Authorizing Provider  glipiZIDE (GLUCOTROL) 10 MG tablet Take 10 mg by mouth daily.   Yes Historical Provider, MD  insulin glargine (LANTUS) 100 UNIT/ML injection Inject 52 Units into the skin at bedtime.    Yes Historical Provider, MD  losartan (COZAAR) 100 MG tablet Take 1 tablet (100 mg total) by mouth daily. 12/23/15  Yes Ripudeep Krystal Eaton, MD  metFORMIN (GLUCOPHAGE) 1000 MG tablet Take 1,000-1,500 mg by mouth daily with breakfast. Takes 1500 mg in the mornings and 1000 mg in the evenings.   Yes Historical Provider, MD  pantoprazole (PROTONIX) 40 MG tablet Take 1 tablet (40 mg total) by mouth daily. 12/23/15  Yes Ripudeep K Rai, MD   BP 158/99 mmHg  Pulse 94  Temp(Src) 99.1 F (37.3 C) (Oral)  Resp 18  Ht 5\' 4"  (1.626 m)  Wt 277 lb (125.646 kg)  BMI 47.52 kg/m2  SpO2 98%  LMP 01/01/2016 Physical Exam  Constitutional: She is oriented to person, place, and time. She appears well-developed.  HENT:  Head: Normocephalic.  Eyes:  Conjunctivae and EOM are normal. No scleral icterus.  Neck: Neck supple. No thyromegaly present.  Cardiovascular: Normal rate and regular rhythm.  Exam reveals no gallop and no friction rub.   No murmur heard. Pulmonary/Chest: No stridor. She has no wheezes. She has no rales. She exhibits no tenderness.  Abdominal: She exhibits no distension. There is no tenderness. There is no rebound.  Musculoskeletal: Normal range of motion. She exhibits no edema.  Lymphadenopathy:    She has no cervical adenopathy.  Neurological: She is oriented to person, place, and time. She exhibits normal muscle tone.  Coordination normal.  Skin: No rash noted. No erythema.  Psychiatric: She has a normal mood and affect. Her behavior is normal.    ED Course  Procedures (including critical care time) Labs Review Labs Reviewed  BASIC METABOLIC PANEL - Abnormal; Notable for the following:    Sodium 134 (*)    Glucose, Bld 422 (*)    All other components within normal limits  CBC - Abnormal; Notable for the following:    Hemoglobin 11.8 (*)    HCT 34.7 (*)    All other components within normal limits  PROTIME-INR  I-STAT TROPOININ, ED  Randolm Idol, ED    Imaging Review Dg Chest 2 View  01/06/2016  CLINICAL DATA:  Chest tightness since yesterday, hypertension, diabetes mellitus EXAM: CHEST  2 VIEW COMPARISON:  12/22/2015 FINDINGS: Borderline enlargement of cardiac silhouette. Mediastinal contours and pulmonary vascularity normal. Minimal chronic central peribronchial thickening. No definite infiltrate, pleural effusion or pneumothorax. IMPRESSION: Borderline enlargement of cardiac silhouette with minimal bronchitic changes. No acute infiltrate. Electronically Signed   By: Lavonia Dana M.D.   On: 01/06/2016 10:04   I have personally reviewed and evaluated these images and lab results as part of my medical decision-making.   EKG Interpretation   Date/Time:  Wednesday January 06 2016 09:18:22 EDT Ventricular Rate:  115 PR Interval:  124 QRS Duration: 78 QT Interval:  318 QTC Calculation: 439 R Axis:   120 Text Interpretation:  Sinus tachycardia Left posterior fascicular block T  wave abnormality, consider inferior ischemia Abnormal ECG Confirmed by  Aiden Rao  MD, Broadus John [id number]) on 01/06/2016 12:41:44 PM      MDM   Final diagnoses:  None    Patient was seen by cardiology. It was decided to admit the patient and do a catheterization on her today    Milton Ferguson, MD 01/06/16 1514

## 2016-01-06 NOTE — Interval H&P Note (Signed)
Cath Lab Visit (complete for each Cath Lab visit)  Clinical Evaluation Leading to the Procedure:   ACS: Yes.    Non-ACS:    Anginal Classification: CCS IV  Anti-ischemic medical therapy: No Therapy  Non-Invasive Test Results: No non-invasive testing performed  Prior CABG: No previous CABG      History and Physical Interval Note:  01/06/2016 3:37 PM  Jacqueline [name]  has presented today for surgery, with the diagnosis of cp  The various methods of treatment have been discussed with the patient and family. After consideration of risks, benefits and other options for treatment, the patient has consented to  Procedure(s): Left Heart Cath and Coronary Angiography (N/A) as a surgical intervention .  The patient's history has been reviewed, patient examined, no change in status, stable for surgery.  I have reviewed the patient's chart and labs.  Questions were answered to the patient's satisfaction.     Kathlyn [place]

## 2016-01-07 ENCOUNTER — Other Ambulatory Visit: Payer: Self-pay

## 2016-01-07 ENCOUNTER — Encounter (HOSPITAL_COMMUNITY): Payer: Self-pay | Admitting: Cardiovascular Disease

## 2016-01-07 DIAGNOSIS — I10 Essential (primary) hypertension: Secondary | ICD-10-CM | POA: Diagnosis not present

## 2016-01-07 DIAGNOSIS — Z79899 Other long term (current) drug therapy: Secondary | ICD-10-CM | POA: Diagnosis not present

## 2016-01-07 DIAGNOSIS — R0789 Other chest pain: Secondary | ICD-10-CM | POA: Diagnosis not present

## 2016-01-07 DIAGNOSIS — E1165 Type 2 diabetes mellitus with hyperglycemia: Secondary | ICD-10-CM | POA: Diagnosis not present

## 2016-01-07 DIAGNOSIS — R079 Chest pain, unspecified: Secondary | ICD-10-CM | POA: Diagnosis not present

## 2016-01-07 DIAGNOSIS — F419 Anxiety disorder, unspecified: Secondary | ICD-10-CM | POA: Diagnosis present

## 2016-01-07 LAB — BASIC METABOLIC PANEL
ANION GAP: 6 (ref 5–15)
BUN: 11 mg/dL (ref 6–20)
CHLORIDE: 103 mmol/L (ref 101–111)
CO2: 26 mmol/L (ref 22–32)
Calcium: 8.6 mg/dL — ABNORMAL LOW (ref 8.9–10.3)
Creatinine, Ser: 0.72 mg/dL (ref 0.44–1.00)
GFR calc non Af Amer: >60 mL/min (ref >60)
Glucose, Bld: 233 mg/dL — ABNORMAL HIGH (ref 65–99)
Potassium: 3.8 mmol/L (ref 3.5–5.1)
Sodium: 135 mmol/L (ref 135–145)

## 2016-01-07 LAB — GLUCOSE, CAPILLARY
GLUCOSE-CAPILLARY: 208 mg/dL — AB (ref 65–99)
Glucose-Capillary: 293 mg/dL — ABNORMAL HIGH (ref 65–99)

## 2016-01-07 NOTE — Discharge Summary (Signed)
Discharge Summary    Patient ID: Jacqueline [name],  MRN: [id number], DOB/AGE: 09/20/1980 35 y.o.  Admit date: 01/06/2016 Discharge date: 01/07/2016  Primary Care Provider: Eloise Levels Primary Cardiologist: Dr. Oval Linsey   Discharge Diagnoses    Principal Problem:   Chest pain at rest Active Problems:   Diabetes [facility])   HTN (hypertension)   Morbid obesity ([place])   Anxiety   Allergies No Known Allergies   History of Present Illness     Jacqueline [name] is a 35 y.o. female with a history of HTN, obesity, family hx of CAD and poorly controlled DMT2 who presented to [facility] on 01/06/16 with chest pain.   She was recently admitted earlier this month (12/2015) for chest pain observation. When she was seen in consultation by cardiology by Dr. Oval Linsey: plan was for her to have an outpatient stress test and PPI trial. She did not have a chance to schedule stress test and PPI trail did not help her chest pain.   She then re-presented with chest pain on 01/06/16. The pain was much more severe. With RFs, family history of CAD and high likelihood of false positive nuclear stress test (due to body habitus), it was decided to admit her for further work up and cardiac cath.   Hospital Course     Consultants: none  1. Chest pain:  She underwent heart cath on 01/06/16 which was normal with no evidence of CAD. Suspect pain related to emotional stress or GI.   2. HTN: BP elevated today but she reports BP OK prior to admit. There may be component of grief and anxiety. No changes recommended per Dr. Martinique  3. DM: poorly controlled. Last HgA1c 11.6. Metformin on hold for 48 hours post cath then resume. Follow up with primary care.  4. Morbid obesity: diet and weight loss recommended.   5. Anxiety: with significant emotional upset while admitted. Recommend follow up with outpatient psych or PCP  The patient has had an uncomplicated hospital course and is recovering well. The radial catheter site is  stable. She has been seen by Dr. Martinique today and deemed ready for discharge home. Discharge medications are listed below.  _____________  Discharge Vitals Blood pressure 164/89, pulse 95, temperature 97.9 F (36.6 C), temperature source Oral, resp. rate 20, height 5\' 4"  (1.626 m), weight 277 lb 12.5 oz (126 kg), last menstrual period 01/01/2016, SpO2 98 %.  Filed Weights   01/06/16 0923 01/07/16 0506  Weight: 277 lb (125.646 kg) 277 lb 12.5 oz (126 kg)    Labs & Radiologic Studies     CBC  Recent Labs  01/06/16 0921  WBC 9.2  HGB 11.8*  HCT 34.7*  MCV 79.4  PLT [id number]   Basic Metabolic Panel  Recent Labs  01/06/16 0921 01/07/16 0429  NA 134* 135  K 4.6 3.8  CL 102 103  CO2 23 26  GLUCOSE 422* 233*  BUN 13 11  CREATININE 0.81 0.72  CALCIUM 9.5 8.6*    Dg Chest 2 View  01/06/2016  CLINICAL DATA:  Chest tightness since yesterday, hypertension, diabetes mellitus EXAM: CHEST  2 VIEW COMPARISON:  12/22/2015 FINDINGS: Borderline enlargement of cardiac silhouette. Mediastinal contours and pulmonary vascularity normal. Minimal chronic central peribronchial thickening. No definite infiltrate, pleural effusion or pneumothorax. IMPRESSION: Borderline enlargement of cardiac silhouette with minimal bronchitic changes. No acute infiltrate. Electronically Signed   By: Lavonia Dana M.D.   On: 01/06/2016 10:04   Dg Chest 2 View  12/22/2015  CLINICAL DATA:  Chest pain and shortness of breath since yesterday. EXAM: CHEST  2 VIEW COMPARISON:  07/05/2015. FINDINGS: The cardiac silhouette, mediastinal and hilar contours are within normal limits and stable. The lungs are clear. No pleural effusion. The bony thorax is intact. IMPRESSION: No acute cardiopulmonary findings. Electronically Signed   By: Marijo Sanes M.D.   On: 12/22/2015 12:06     Diagnostic Studies/Procedures   01/06/16 Procedures    Left Heart Cath and Coronary Angiography    Conclusion    1. Normal coronary  arteries. 2. Left ventricular angiography was not performed. EF was normal by echocardiogram recently. 3. Mildly elevated left ventricular end-diastolic pressure with mild gradient across the LVOT.  Recommendations: The chest pain is likely noncardiac. The patient vomited at the end of the case and was given Zofran.  Discharge home tomorrow if no other issues.    _____________    Disposition   Pt is being discharged home today in good condition.  Follow-up Plans & Appointments    Follow-up Information    Follow up with Eloise Levels, NP.   Specialty:  Nurse Practitioner   Why:  you do not require long term cardiology follow up. Please follow up wiht PCP for diabetes and high BP   Contact information:   [address] [address] [phone]        Discharge Medications   Current Discharge Medication List    CONTINUE these medications which have NOT CHANGED   Details  glipiZIDE (GLUCOTROL) 10 MG tablet Take 10 mg by mouth daily.    insulin glargine (LANTUS) 100 UNIT/ML injection Inject 52 Units into the skin at bedtime.     losartan (COZAAR) 100 MG tablet Take 1 tablet (100 mg total) by mouth daily. Qty: 30 tablet, Refills: 3    metFORMIN (GLUCOPHAGE) 1000 MG tablet Take 1,000-1,500 mg by mouth daily with breakfast. Takes 1500 mg in the mornings and 1000 mg in the evenings.    pantoprazole (PROTONIX) 40 MG tablet Take 1 tablet (40 mg total) by mouth daily. Qty: 30 tablet, Refills: 1         Outstanding Labs/Studies   none  Duration of Discharge Encounter   Greater than 30 minutes including physician time.  Signed, Angelena Form PA-C 01/07/2016, 12:02 PM

## 2016-01-07 NOTE — Progress Notes (Signed)
   01/07/16 1100  Clinical Encounter Type  Visited With Patient  Visit Type Spiritual support  Referral From Nurse  Spiritual Encounters  Spiritual Needs Emotional;Grief support  Stress Factors  Patient Stress Factors Loss  Nurse noted that patient had been crying and found that patient had lost an aunt who raised her. Since this loss was 6 months ago, nurse had concerns about her still crying. Chaplain spent time with patient discussing loss and whether or not patient might be depressed. Patient indicated she would go see her PCP upon leaving to determine if medication or therapy might help. Chaplain prayed for her as she was about to be discharged. Jullianna Gabor, Chaplain

## 2016-01-07 NOTE — Progress Notes (Signed)
TELEMETRY: Reviewed telemetry pt in NSR with rare PVC: Filed Vitals:   01/06/16 2130 01/06/16 2300 01/07/16 0506 01/07/16 0742  BP: 135/68 143/89 163/99 164/89  Pulse:   86 95  Temp:   98.3 F (36.8 C) 97.9 F (36.6 C)  TempSrc:   Oral Oral  Resp: 19 19 17 20   Height:      Weight:   277 lb 12.5 oz (126 kg)   SpO2:   100% 98%    Intake/Output Summary (Last 24 hours) at 01/07/16 1104 Last data filed at 01/07/16 0744  Gross per 24 hour  Intake   2034 ml  Output   1700 ml  Net    334 ml   Filed Weights   01/06/16 0923 01/07/16 0506  Weight: 277 lb (125.646 kg) 277 lb 12.5 oz (126 kg)    Subjective Feels well this am. No chest pain or SOB.  Given GI cocktail for pain last night with relief.  Marland Kitchen aspirin  324 mg Oral NOW   Or  . aspirin  300 mg Rectal NOW  . aspirin EC  81 mg Oral Daily  . enoxaparin (LOVENOX) injection  40 mg Subcutaneous Q24H  . glipiZIDE  10 mg Oral Q breakfast  . insulin glargine  52 Units Subcutaneous QHS  . losartan  100 mg Oral Daily  . pantoprazole  40 mg Oral Daily  . pantoprazole  40 mg Oral Daily  . sodium chloride flush  3 mL Intravenous Q12H      LABS: Basic Metabolic Panel:  Recent Labs  01/06/16 0921 01/07/16 0429  NA 134* 135  K 4.6 3.8  CL 102 103  CO2 23 26  GLUCOSE 422* 233*  BUN 13 11  CREATININE 0.81 0.72  CALCIUM 9.5 8.6*   Liver Function Tests: No results for input(s): AST, ALT, ALKPHOS, BILITOT, PROT, ALBUMIN in the last 72 hours. No results for input(s): LIPASE, AMYLASE in the last 72 hours. CBC:  Recent Labs  01/06/16 0921  WBC 9.2  HGB 11.8*  HCT 34.7*  MCV 79.4  PLT 366   Cardiac Enzymes: No results for input(s): CKTOTAL, CKMB, CKMBINDEX, TROPONINI in the last 72 hours. BNP: No results for input(s): PROBNP in the last 72 hours. D-Dimer: No results for input(s): DDIMER in the last 72 hours. Hemoglobin A1C: No results for input(s): HGBA1C in the last 72 hours. Fasting Lipid Panel: No results for  input(s): CHOL, HDL, LDLCALC, TRIG, CHOLHDL, LDLDIRECT in the last 72 hours. Thyroid Function Tests: No results for input(s): TSH, T4TOTAL, T3FREE, THYROIDAB in the last 72 hours.  Invalid input(s): FREET3   Radiology/Studies:  Dg Chest 2 View  01/06/2016  CLINICAL DATA:  Chest tightness since yesterday, hypertension, diabetes mellitus EXAM: CHEST  2 VIEW COMPARISON:  12/22/2015 FINDINGS: Borderline enlargement of cardiac silhouette. Mediastinal contours and pulmonary vascularity normal. Minimal chronic central peribronchial thickening. No definite infiltrate, pleural effusion or pneumothorax. IMPRESSION: Borderline enlargement of cardiac silhouette with minimal bronchitic changes. No acute infiltrate. Electronically Signed   By: Lavonia Dana M.D.   On: 01/06/2016 10:04    PHYSICAL EXAM General: Well developed, obese, in no acute distress. Head: Normocephalic, atraumatic, sclera non-icteric, oropharynx is clear Neck: Negative for carotid bruits. JVD not elevated. No adenopathy Lungs: Clear bilaterally to auscultation without wheezes, rales, or rhonchi. Breathing is unlabored. Heart: RRR S1 S2 without murmurs, rubs, or gallops.  Abdomen: Soft, non-tender, non-distended with normoactive bowel sounds. No hepatomegaly. No rebound/guarding. No obvious abdominal masses. Msk:  Strength and tone appears normal for age. Extremities: No clubbing, cyanosis or edema.  Distal pedal pulses are 2+ and equal bilaterally. Right radial site without hematoma. Neuro: Alert and oriented X 3. Moves all extremities spontaneously. Psych:  Responds to questions appropriately with a normal affect.  ASSESSMENT AND PLAN: 1. Chest pain. Normal cardiac cath. Suspect pain related to emotional stress or GI. Plan to DC home today with follow up as outpatient. 2. HTN- BP elevated today but she reports BP OK prior to admit. There may be component of grief and anxiety 3. DM- metformin on hold for 48 hours post cath then  resume. Follow up with primary care.  Present on Admission:  . Chest pain with high risk for cardiac etiology  Signed, Ceniyah Thorp Martinique, [place] 01/07/2016 11:04 AM

## 2016-01-07 NOTE — Discharge Instructions (Signed)

## 2016-03-14 ENCOUNTER — Emergency Department (HOSPITAL_COMMUNITY): Payer: BLUE CROSS/BLUE SHIELD

## 2016-03-14 ENCOUNTER — Encounter (HOSPITAL_COMMUNITY): Payer: Self-pay | Admitting: Emergency Medicine

## 2016-03-14 ENCOUNTER — Inpatient Hospital Stay (HOSPITAL_COMMUNITY)
Admission: EM | Admit: 2016-03-14 | Discharge: 2016-03-18 | DRG: 872 | Disposition: A | Discharge status: Home / Self Care | Payer: BLUE CROSS/BLUE SHIELD | Attending: Internal Medicine | Admitting: Internal Medicine

## 2016-03-14 ENCOUNTER — Inpatient Hospital Stay (HOSPITAL_COMMUNITY): Payer: BLUE CROSS/BLUE SHIELD

## 2016-03-14 DIAGNOSIS — R51 Headache: Secondary | ICD-10-CM | POA: Diagnosis not present

## 2016-03-14 DIAGNOSIS — E119 Type 2 diabetes mellitus without complications: Secondary | ICD-10-CM

## 2016-03-14 DIAGNOSIS — I10 Essential (primary) hypertension: Secondary | ICD-10-CM | POA: Diagnosis present

## 2016-03-14 DIAGNOSIS — B955 Unspecified streptococcus as the cause of diseases classified elsewhere: Secondary | ICD-10-CM | POA: Diagnosis present

## 2016-03-14 DIAGNOSIS — B951 Streptococcus, group B, as the cause of diseases classified elsewhere: Secondary | ICD-10-CM | POA: Diagnosis present

## 2016-03-14 DIAGNOSIS — B9561 Methicillin susceptible Staphylococcus aureus infection as the cause of diseases classified elsewhere: Secondary | ICD-10-CM | POA: Diagnosis present

## 2016-03-14 DIAGNOSIS — R509 Fever, unspecified: Secondary | ICD-10-CM | POA: Diagnosis present

## 2016-03-14 DIAGNOSIS — Z79899 Other long term (current) drug therapy: Secondary | ICD-10-CM

## 2016-03-14 DIAGNOSIS — J449 Chronic obstructive pulmonary disease, unspecified: Secondary | ICD-10-CM | POA: Diagnosis present

## 2016-03-14 DIAGNOSIS — E876 Hypokalemia: Secondary | ICD-10-CM | POA: Diagnosis present

## 2016-03-14 DIAGNOSIS — Z6841 Body Mass Index (BMI) 40.0 and over, adult: Secondary | ICD-10-CM

## 2016-03-14 DIAGNOSIS — R7881 Bacteremia: Secondary | ICD-10-CM | POA: Diagnosis present

## 2016-03-14 DIAGNOSIS — R197 Diarrhea, unspecified: Secondary | ICD-10-CM | POA: Diagnosis present

## 2016-03-14 DIAGNOSIS — R112 Nausea with vomiting, unspecified: Secondary | ICD-10-CM | POA: Diagnosis present

## 2016-03-14 DIAGNOSIS — R519 Headache, unspecified: Secondary | ICD-10-CM | POA: Diagnosis present

## 2016-03-14 DIAGNOSIS — Z794 Long term (current) use of insulin: Secondary | ICD-10-CM | POA: Diagnosis not present

## 2016-03-14 DIAGNOSIS — R000 Tachycardia, unspecified: Secondary | ICD-10-CM | POA: Diagnosis present

## 2016-03-14 DIAGNOSIS — F419 Anxiety disorder, unspecified: Secondary | ICD-10-CM | POA: Diagnosis present

## 2016-03-14 DIAGNOSIS — B954 Other streptococcus as the cause of diseases classified elsewhere: Secondary | ICD-10-CM | POA: Diagnosis not present

## 2016-03-14 DIAGNOSIS — G039 Meningitis, unspecified: Secondary | ICD-10-CM | POA: Insufficient documentation

## 2016-03-14 DIAGNOSIS — M791 Myalgia: Secondary | ICD-10-CM | POA: Diagnosis not present

## 2016-03-14 HISTORY — DX: Anemia, unspecified: D64.9

## 2016-03-14 LAB — CBC WITH DIFFERENTIAL/PLATELET
Basophils Absolute: 0 10*3/uL (ref 0.0–0.1)
Basophils Relative: 0 %
EOS ABS: 0.1 10*3/uL (ref 0.0–0.7)
EOS PCT: 1 %
HCT: 35.9 % — ABNORMAL LOW (ref 36.0–46.0)
Hemoglobin: 11.9 g/dL — ABNORMAL LOW (ref 12.0–15.0)
LYMPHS ABS: 1.5 10*3/uL (ref 0.7–4.0)
LYMPHS PCT: 14 %
MCH: 27.1 pg (ref 26.0–34.0)
MCHC: 33.1 g/dL (ref 30.0–36.0)
MCV: 81.8 fL (ref 78.0–100.0)
MONO ABS: 0.3 10*3/uL (ref 0.1–1.0)
MONOS PCT: 3 %
Neutro Abs: 8.9 10*3/uL — ABNORMAL HIGH (ref 1.7–7.7)
Neutrophils Relative %: 82 %
PLATELETS: 310 10*3/uL (ref 150–400)
RBC: 4.39 MIL/uL (ref 3.87–5.11)
RDW: 13.6 % (ref 11.5–15.5)
WBC: 10.8 10*3/uL — ABNORMAL HIGH (ref 4.0–10.5)

## 2016-03-14 LAB — COMPREHENSIVE METABOLIC PANEL
ALBUMIN: 3.4 g/dL — AB (ref 3.5–5.0)
ALT: 13 U/L — AB (ref 14–54)
AST: 16 U/L (ref 15–41)
Alkaline Phosphatase: 60 U/L (ref 38–126)
Anion gap: 10 (ref 5–15)
BUN: 12 mg/dL (ref 6–20)
CHLORIDE: 104 mmol/L (ref 101–111)
CO2: 22 mmol/L (ref 22–32)
CREATININE: 0.78 mg/dL (ref 0.44–1.00)
Calcium: 8.9 mg/dL (ref 8.9–10.3)
GFR calc Af Amer: >60 mL/min (ref >60)
GFR calc non Af Amer: >60 mL/min (ref >60)
GLUCOSE: 283 mg/dL — AB (ref 65–99)
POTASSIUM: 3.7 mmol/L (ref 3.5–5.1)
Sodium: 136 mmol/L (ref 135–145)
Total Bilirubin: 1.2 mg/dL (ref 0.3–1.2)
Total Protein: 7.4 g/dL (ref 6.5–8.1)

## 2016-03-14 LAB — URINALYSIS, ROUTINE W REFLEX MICROSCOPIC
Ketones, ur: NEGATIVE mg/dL
Leukocytes, UA: NEGATIVE
Nitrite: NEGATIVE
Specific Gravity, Urine: 1.036 — ABNORMAL HIGH (ref 1.005–1.030)
pH: 5.5 (ref 5.0–8.0)

## 2016-03-14 LAB — URINE MICROSCOPIC-ADD ON

## 2016-03-14 LAB — I-STAT CG4 LACTIC ACID, ED
LACTIC ACID, VENOUS: 1.91 mmol/L — AB (ref 0.5–1.9)
Lactic Acid, Venous: 2.12 mmol/L (ref 0.5–1.9)

## 2016-03-14 LAB — GLUCOSE, CAPILLARY: Glucose-Capillary: 256 mg/dL — ABNORMAL HIGH (ref 65–99)

## 2016-03-14 MED ORDER — VANCOMYCIN HCL IN DEXTROSE 1-5 GM/200ML-% IV SOLN
1000.0000 mg | Freq: Three times a day (TID) | INTRAVENOUS | Status: DC
  Administered 2016-03-15 – 2016-03-16 (×5): 1000 mg via INTRAVENOUS
  Filled 2016-03-14 (×6): qty 200

## 2016-03-14 MED ORDER — SERTRALINE HCL 50 MG PO TABS
50.0000 mg | ORAL_TABLET | Freq: Every day | ORAL | Status: DC
  Administered 2016-03-15 – 2016-03-18 (×4): 50 mg via ORAL
  Filled 2016-03-14 (×4): qty 1

## 2016-03-14 MED ORDER — LOSARTAN POTASSIUM 50 MG PO TABS
100.0000 mg | ORAL_TABLET | Freq: Every day | ORAL | Status: DC
  Administered 2016-03-14 – 2016-03-17 (×4): 100 mg via ORAL
  Filled 2016-03-14 (×5): qty 2

## 2016-03-14 MED ORDER — METOCLOPRAMIDE HCL 5 MG/ML IJ SOLN
10.0000 mg | Freq: Once | INTRAMUSCULAR | Status: AC
  Administered 2016-03-14: 10 mg via INTRAVENOUS
  Filled 2016-03-14: qty 2

## 2016-03-14 MED ORDER — MORPHINE SULFATE (PF) 4 MG/ML IV SOLN
4.0000 mg | Freq: Once | INTRAVENOUS | Status: AC
  Administered 2016-03-14: 4 mg via INTRAVENOUS
  Filled 2016-03-14: qty 1

## 2016-03-14 MED ORDER — ACETAMINOPHEN 500 MG PO TABS
1000.0000 mg | ORAL_TABLET | Freq: Once | ORAL | Status: AC
  Administered 2016-03-14: 1000 mg via ORAL
  Filled 2016-03-14: qty 2

## 2016-03-14 MED ORDER — ACETAMINOPHEN 650 MG RE SUPP: 650.0000 mg | Freq: Four times a day (QID) | RECTAL | Status: DC | PRN

## 2016-03-14 MED ORDER — GADOBENATE DIMEGLUMINE 529 MG/ML IV SOLN
10.0000 mL | Freq: Once | INTRAVENOUS | Status: AC | PRN
  Administered 2016-03-14: 10 mL via INTRAVENOUS

## 2016-03-14 MED ORDER — SODIUM CHLORIDE 0.9 % IV SOLN
INTRAVENOUS | Status: DC
  Administered 2016-03-14 – 2016-03-17 (×4): via INTRAVENOUS

## 2016-03-14 MED ORDER — CEFTRIAXONE SODIUM 2 G IJ SOLR
2.0000 g | Freq: Once | INTRAMUSCULAR | Status: AC
  Administered 2016-03-14: 2 g via INTRAVENOUS
  Filled 2016-03-14: qty 2

## 2016-03-14 MED ORDER — SODIUM CHLORIDE 0.9 % IV SOLN
2500.0000 mg | Freq: Once | INTRAVENOUS | Status: AC
  Administered 2016-03-14: 2500 mg via INTRAVENOUS
  Filled 2016-03-14: qty 2500

## 2016-03-14 MED ORDER — DEXTROSE 5 % IV SOLN
2.0000 g | Freq: Two times a day (BID) | INTRAVENOUS | Status: DC
  Administered 2016-03-15 (×2): 2 g via INTRAVENOUS
  Filled 2016-03-14 (×3): qty 2

## 2016-03-14 MED ORDER — ONDANSETRON HCL 4 MG PO TABS: 4.0000 mg | ORAL_TABLET | Freq: Four times a day (QID) | ORAL | Status: DC | PRN

## 2016-03-14 MED ORDER — ACETAMINOPHEN 325 MG PO TABS
650.0000 mg | ORAL_TABLET | Freq: Four times a day (QID) | ORAL | Status: DC | PRN
  Administered 2016-03-14 – 2016-03-16 (×3): 650 mg via ORAL
  Filled 2016-03-14 (×3): qty 2

## 2016-03-14 MED ORDER — PANTOPRAZOLE SODIUM 40 MG PO TBEC
40.0000 mg | DELAYED_RELEASE_TABLET | Freq: Every day | ORAL | Status: DC
  Administered 2016-03-15 – 2016-03-18 (×4): 40 mg via ORAL
  Filled 2016-03-14 (×4): qty 1

## 2016-03-14 MED ORDER — ONDANSETRON HCL 4 MG/2ML IJ SOLN
4.0000 mg | Freq: Four times a day (QID) | INTRAMUSCULAR | Status: DC | PRN
  Administered 2016-03-17: 4 mg via INTRAVENOUS
  Filled 2016-03-14: qty 2

## 2016-03-14 MED ORDER — INSULIN GLARGINE 100 UNIT/ML ~~LOC~~ SOLN
45.0000 [IU] | Freq: Every day | SUBCUTANEOUS | Status: DC
  Administered 2016-03-14 – 2016-03-17 (×4): 45 [IU] via SUBCUTANEOUS
  Filled 2016-03-14 (×5): qty 0.45

## 2016-03-14 MED ORDER — ALPRAZOLAM 0.5 MG PO TABS: 0.5000 mg | ORAL_TABLET | Freq: Every day | ORAL | Status: DC | PRN

## 2016-03-14 MED ORDER — SODIUM CHLORIDE 0.9 % IV BOLUS (SEPSIS)
3000.0000 mL | Freq: Once | INTRAVENOUS | Status: AC
  Administered 2016-03-14: 3000 mL via INTRAVENOUS

## 2016-03-14 MED ORDER — MORPHINE SULFATE (PF) 2 MG/ML IV SOLN
2.0000 mg | INTRAVENOUS | Status: DC | PRN
  Administered 2016-03-14 – 2016-03-16 (×8): 2 mg via INTRAVENOUS
  Filled 2016-03-14 (×8): qty 1

## 2016-03-14 NOTE — ED Notes (Signed)
Carmelina Paddock, RN attempted to site IV with no success. Iv team consult ordered

## 2016-03-14 NOTE — ED Notes (Signed)
Patient returned to room from IR

## 2016-03-14 NOTE — ED Provider Notes (Signed)
Emergency Department Provider Note   I have reviewed the triage vital signs and the nursing notes.   HISTORY  Chief Complaint Fever and Emesis   HPI Jacqueline [name] is a 35 y.o. female with PMH of anxiety, COPD, HTN, obesity, and DM presents to the emergency department for evaluation of headache, fever, muscle aches, vomiting, and diarrhea. The patient states that symptoms began with headache yesterday. She began to feels significant fatigue, muscle aches, fevers. Vomiting and diarrhea started this morning. The patient reports some lightheadedness and heart palpitations especially with standing. No chest pain. No abdominal discomfort. Patient with some mild neck discomfort but denies stiffness. Patient has a son who was recently hospitalized for vomiting.    Past Medical History:  Diagnosis Date  . Anemia   . Anxiety   . Chronic bronchitis ([place])   . Hypertension   . Morbid obesity ([place])   . Type II diabetes mellitus [facility])     Patient Active Problem List   Diagnosis Date Noted  . Morbid obesity ([place])   . Anxiety   . Chest pain at rest   . HTN (hypertension) 12/22/2015  . Diabetes ([place]) 01/01/2014    Past Surgical History:  Procedure Laterality Date  . CARDIAC CATHETERIZATION  01/06/2016  . CARDIAC CATHETERIZATION N/A 01/06/2016   Procedure: Left Heart Cath and Coronary Angiography;  Surgeon: Wellington Hampshire, MD;  Location: [facility];  Service: Cardiovascular;  Laterality: N/A;  . CESAREAN SECTION  08/2014    Current Outpatient Rx  . Order #: [id number] Class: Historical Med  . Order #: [id number] Class: Historical Med  . Order #: [id number] Class: Print  . Order #: [id number] Class: Historical Med  . Order #: [id number] Class: Print    Allergies Review of patient's allergies indicates no known allergies.  Family History  Problem Relation Age of Onset  . Diabetes Mother   . Hypertension Mother   . Thyroid disease Mother   . Kidney disease Maternal Grandmother   .  Diabetes Maternal Grandmother   . Heart attack      Social History Social History  Substance Use Topics  . Smoking status: Never Smoker  . Smokeless tobacco: Never Used  . Alcohol use No    Review of Systems  Constitutional: Positive fever/chills and body aches.  Eyes: No visual changes. ENT: No sore throat. Cardiovascular: Denies chest pain. Positive heart palpitations.  Respiratory: Denies shortness of breath. Gastrointestinal: No abdominal pain.  Positive nausea, vomiting, and diarrhea.  No constipation. Genitourinary: Negative for dysuria. Musculoskeletal: Negative for back pain. Skin: Negative for rash. Neurological: Negative for focal weakness or numbness. Positive HA.   10-point ROS otherwise negative.  ____________________________________________   PHYSICAL EXAM:  VITAL SIGNS: ED Triage Vitals  Enc Vitals Group     BP 03/14/16 1046 151/90     Pulse Rate 03/14/16 1046 (!) 133     Resp 03/14/16 1046 20     Temp 03/14/16 1046 101.3 F (38.5 C)     Temp Source 03/14/16 1046 Oral     SpO2 03/14/16 1046 96 %     Pain Score 03/14/16 1058 10    Constitutional: Alert and oriented. Well appearing and in no acute distress. Eyes: Conjunctivae are normal. PERRL. EOMI. Head: Atraumatic. Nose: No congestion/rhinnorhea. Mouth/Throat: Mucous membranes are moist. Oropharynx non-erythematous. Neck: No stridor.  No meningeal signs. No cervical spine tenderness to palpation. Cardiovascular: Sinus tachycardia. Good peripheral circulation. Grossly normal heart sounds.   Respiratory: Normal respiratory effort.  No retractions.  Lungs CTAB. Gastrointestinal: Soft with mild LLQ tenderness to palpation. No rebound or guarding. No distention.  Musculoskeletal: No lower extremity tenderness nor edema. No gross deformities of extremities. Neurologic:  Normal speech and language. No gross focal neurologic deficits are appreciated.  Skin:  Skin is warm, dry and intact. No rash  noted. Psychiatric: Mood and affect are normal. Speech and behavior are normal.  ____________________________________________   LABS (all labs ordered are listed, but only abnormal results are displayed)  Labs Reviewed  COMPREHENSIVE METABOLIC PANEL - Abnormal; Notable for the following:       Result Value   Glucose, Bld 283 (*)    Albumin 3.4 (*)    ALT 13 (*)    All other components within normal limits  CBC WITH DIFFERENTIAL/PLATELET - Abnormal; Notable for the following:    WBC 10.8 (*)    Hemoglobin 11.9 (*)    HCT 35.9 (*)    Neutro Abs 8.9 (*)    All other components within normal limits  URINALYSIS, ROUTINE W REFLEX MICROSCOPIC (NOT AT [facility]) - Abnormal; Notable for the following:    Color, Urine AMBER (*)    APPearance CLOUDY (*)    Specific Gravity, Urine 1.036 (*)    Glucose, UA >1000 (*)    Hgb urine dipstick MODERATE (*)    Bilirubin Urine SMALL (*)    Protein, ur >300 (*)    All other components within normal limits  URINE MICROSCOPIC-ADD ON - Abnormal; Notable for the following:    Squamous Epithelial / LPF 6-30 (*)    Bacteria, UA FEW (*)    All other components within normal limits  I-STAT CG4 LACTIC ACID, ED - Abnormal; Notable for the following:    Lactic Acid, Venous 1.91 (*)    All other components within normal limits  CULTURE, BLOOD (ROUTINE X 2)  CULTURE, BLOOD (ROUTINE X 2)  URINE CULTURE  CSF CULTURE  GLUCOSE, CSF  PROTEIN, CSF  CSF CELL COUNT WITH DIFFERENTIAL  CSF CELL COUNT WITH DIFFERENTIAL  I-STAT CG4 LACTIC ACID, ED   ____________________________________________  EKG   EKG Interpretation  Date/Time:  Monday March 14 2016 10:43:50 EDT Ventricular Rate:  134 PR Interval:  146 QRS Duration: 70 QT Interval:  290 QTC Calculation: 433 R Axis:   139 Text Interpretation:  Sinus tachycardia Possible Lateral infarct , age undetermined Abnormal ECG No STEMI.  Confirmed by Yashira Offenberger MD, Marvell Stavola [phone]) on 03/14/2016 11:27:10 AM        ____________________________________________  RADIOLOGY  Dg Chest 2 View  Result Date: 03/14/2016 CLINICAL DATA:  Weakness, vomiting and fever beginning yesterday. EXAM: CHEST  2 VIEW COMPARISON:  PA and lateral chest 01/06/2016 and 07/05/2015. FINDINGS: The lungs are clear. Heart size is normal. No pneumothorax or pleural effusion. No focal bony abnormality. IMPRESSION: No acute disease. Electronically Signed   By: Inge Rise M.D.   On: 03/14/2016 13:33   Ct Head Wo Contrast  Result Date: 03/14/2016 CLINICAL DATA:  Headache, fever, and vomiting. EXAM: CT HEAD WITHOUT CONTRAST TECHNIQUE: Contiguous axial images were obtained from the base of the skull through the vertex without intravenous contrast. COMPARISON:  CT scan dated 12/17/2009 FINDINGS: Brain: No mass lesion. No midline shift. No acute hemorrhage or hematoma. No extra-axial fluid collections. No evidence of acute infarction. Brain parenchyma is normal. Vascular: Normal. Skull: Normal. Sinuses/Orbits: Normal. IMPRESSION: Normal exam. Electronically Signed   By: Lorriane Shire M.D.   On: 03/14/2016 13:52    ____________________________________________  PROCEDURES  Procedure(s) performed:   .Lumbar Puncture Date/Time: 03/14/2016 3:31 PM Performed by: Daisha Filosa G Authorized by: Margette Fast   Consent:    Consent obtained:  Written   Consent given by:  Patient   Risks discussed:  Bleeding, infection, pain, repeat procedure, nerve damage and headache   Alternatives discussed:  No treatment and observation Universal protocol:    Procedure explained and questions answered to patient or proxy's satisfaction: yes     Relevant documents present and verified: yes     Test results available and properly labeled: yes     Imaging studies available: yes     Required blood products, implants, devices, and special equipment available: yes     Immediately prior to procedure a time out was called: yes     Site/side marked:  yes     Patient identity confirmed:  Verbally with patient, hospital-assigned identification number and arm band Pre-procedure details:    Procedure purpose:  Diagnostic   Preparation: Patient was prepped and draped in usual sterile fashion   Anesthesia (see MAR for exact dosages):    Anesthesia method:  Local infiltration   Local anesthetic:  Lidocaine 1% w/o epi Procedure details:    Lumbar space:  L3-L4 interspace   Patient position:  Sitting   Needle gauge:  18   Needle type:  Spinal needle - Quincke tip   Needle length (in):  3.5   Ultrasound guidance: no     Number of attempts:  2 (unsuccessful) Post-procedure:    Puncture site:  Adhesive bandage applied   Patient tolerance of procedure:  Tolerated well, no immediate complications   ____________________________________________   INITIAL IMPRESSION / ASSESSMENT AND PLAN / ED COURSE  Pertinent labs & imaging results that were available during my care of the patient were reviewed by me and considered in my medical decision making (see chart for details).  Patient resents the emergency pertinent for evaluation of headache, fever, vomiting, diarrhea, body aches. Patient is febrile in the emergency department with sinus tachycardia. Evaluated EKG with no evidence of ischemia. Patient has no meningeal signs and has a sick child at home with vomiting. Suspect primary GI source clinically with vomiting and diarrhea. Patient is complaining of some mild neck discomfort but has full range of motion without difficulty. No photophobia. Plan for evaluation of fever. Will hold anabiotic's at this time until identifiable source with low suspicion for meningitis which would require immediate treatment.   02:44 PM No clear infection source at this time. Attempted lumbar puncture 2 without success. Given no other infection source and patient meeting sepsis criteria will initiate antibiotics for meningitis and discuss lumbar puncture under  fluoroscopy. Will page hospitalist for admission, continued IVF, abx, and follow cultures.   04:58 PM LP unsuccessful with IR. Waiting for call back from hospitalist for admission.   05:28 PM Discussed patient's case with hospitalist, Dr. Broadus John.  Recommend admission to med-surg, inpatient bed.  I will place holding orders per their request. Patient and family (if present) updated with plan. Care transferred to hospitalist service.  I reviewed all nursing notes, vitals, pertinent old records, EKGs, labs, imaging (as available).  ____________________________________________  FINAL CLINICAL IMPRESSION(S) / ED DIAGNOSES  Final diagnoses:  Meningitis  Fever, unspecified fever cause  Nausea vomiting and diarrhea     MEDICATIONS GIVEN DURING THIS VISIT:  Medications  vancomycin (VANCOCIN) 2,500 mg in sodium chloride 0.9 % 500 mL IVPB (not administered)  sodium chloride 0.9 % bolus  3,000 mL (3,000 mLs Intravenous New Bag/Given 03/14/16 1244)  metoCLOPramide (REGLAN) injection 10 mg (10 mg Intravenous Given 03/14/16 1245)  morphine 4 MG/ML injection 4 mg (4 mg Intravenous Given 03/14/16 1245)  acetaminophen (TYLENOL) tablet 1,000 mg (1,000 mg Oral Given 03/14/16 1245)  cefTRIAXone (ROCEPHIN) 2 g in dextrose 5 % 50 mL IVPB (2 g Intravenous New Bag/Given 03/14/16 1502)     NEW OUTPATIENT MEDICATIONS STARTED DURING THIS VISIT:  None   Note:  This document was prepared using Dragon voice recognition software and may include unintentional dictation errors.  Nanda Quinton, MD Emergency Medicine   Margette Fast, MD 03/15/16 [phone]

## 2016-03-14 NOTE — ED Notes (Signed)
This RN attempted to site IV x2 with no success.

## 2016-03-14 NOTE — ED Notes (Signed)
Patient transported to IR 

## 2016-03-14 NOTE — ED Notes (Signed)
IV Team at bedside 

## 2016-03-14 NOTE — Procedures (Signed)
CLINICAL DATA: [Headache and neck pain. Rule out meningitis.] EXAM: DIAGNOSTIC LUMBAR PUNCTURE UNDER FLUOROSCOPIC GUIDANCE FLUOROSCOPY TIME:  If the device does not provide the exposure index: Fluoroscopy Time (in minutes and seconds): [3 minutes and 54 seconds] Number of Acquired Images: [None] PROCEDURE: Informed consent was obtained from the patient prior to the procedure, including potential complications of headache, allergy, and pain. With the patient prone, the lower back was prepped with Betadine. 1% Lidocaine was used for local anesthesia. Lumbar puncture was attempted at the [L4-5] level using a [20] gauge needle. Procedure complicated by patient body habitus. Despite multiple attempts and multiple times repositioning of the needle, the thecal sac could not be entered.  IMPRESSION: [Unsuccessful, attempted lumbar puncture, as detailed above. No acute complication.]

## 2016-03-14 NOTE — Progress Notes (Signed)
Pharmacy Antibiotic Note  Jacqueline [name] is a 35 y.o. female admitted on 03/14/2016 with suspected meningitis.  Pharmacy has been consulted for ceftriaxone and vancomycin dosing. Renal function wnl. Will use obesity dosing nomogram for weight of 121.6kg. Vancomycin 2500mg  x 1 and ceftriaxone 2g x 1 given in the ED.  Vancomycin goal trough 15-20  Plan: 1) Continue with vancomycin 1g IV q8 2) Continue with ceftriaxone 2g IV q12 3) Follow LP, cultures, renal function, LOT, level if needed  Height: 5\' 4"  (162.6 cm) Weight: 285 lb 6.4 oz (129.5 kg) IBW/kg (Calculated) : 54.7  Temp (24hrs), Avg:101.9 F (38.8 C), Min:101.3 F (38.5 C), Max:102.4 F (39.1 C)   Recent Labs Lab 03/14/16 1248 03/14/16 1256 03/14/16 1704  WBC 10.8*  --   --   CREATININE 0.78  --   --   LATICACIDVEN  --  1.91* 2.12*    Estimated Creatinine Clearance: 129.8 mL/min (by C-G formula based on SCr of 0.8 mg/dL).    No Known Allergies  Antimicrobials this admission: 8/28 Vancomycin >> 8/28 Ceftriaxone >>  Dose adjustments this admission: n/a  Microbiology results: 8/28 blood x2>> 8/28 urine >>  Thank you for allowing pharmacy to be a part of this patient's care.  Deboraha Sprang 03/14/2016 7:03 PM

## 2016-03-14 NOTE — ED Notes (Signed)
MD at bedside to perform LP.

## 2016-03-14 NOTE — ED Triage Notes (Signed)
Pt arrives via POV from home with fever and vomiting since last night. Pts main complaint of headache at present. Pt denies recent cough, dysuria, abdominal pain. Pt awake, alert, oriented x4.

## 2016-03-14 NOTE — H&P (Signed)
History and Physical    Jacqueline [name] [id number] DOB: [birth year] DOA: 03/14/2016  Referring MD/NP/PA: EDP PCP: Eloise Levels, NP  Patient coming from: Home  Chief Complaint: Headache and fever  HPI: Jacqueline [name] is a 35 y.o. female with medical history significant of DM, HTN, obesity, depression presents to the ER with the above complaints. Pt reports experiencing a headache, nausea and chills last evening, subsequently tried to sleep and woke up at midnight with a severe headache, fever, neck pain/chills and vomited x2. Vomitus was non bloody and non bilious. After this had one episode of diarrhea. Came to the ER this afternoon and LP was attempted x2 by EDP and once by IR under fluorocopy all unsuccessful -due to body habitus. Then started on Empiric Ceftriaxone and TRH consulted for admission  Review of Systems: As per HPI, all other systems reviewed and are negative.    Past Medical History:  Diagnosis Date  . Anemia   . Anxiety   . Chronic bronchitis ([place])   . Hypertension   . Morbid obesity ([place])   . Type II diabetes mellitus ([place])     Past Surgical History:  Procedure Laterality Date  . CARDIAC CATHETERIZATION  01/06/2016  . CARDIAC CATHETERIZATION N/A 01/06/2016   Procedure: Left Heart Cath and Coronary Angiography;  Surgeon: Wellington Hampshire, MD;  Location: [facility];  Service: Cardiovascular;  Laterality: N/A;  . CESAREAN SECTION  08/2014     reports that she has never smoked. She has never used smokeless tobacco. She reports that she does not drink alcohol or use drugs.  No Known Allergies  Family History  Problem Relation Age of Onset  . Diabetes Mother   . Hypertension Mother   . Thyroid disease Mother   . Kidney disease Maternal Grandmother   . Diabetes Maternal Grandmother   . Heart attack      Prior to Admission medications   Medication Sig Start Date End Date Taking? Authorizing Provider  ALPRAZolam Duanne Moron) 0.5 MG tablet Take 0.5 mg by  mouth daily as needed for anxiety. 02/19/16  Yes Historical Provider, MD  glipiZIDE (GLUCOTROL) 10 MG tablet Take 10 mg by mouth daily.   Yes Historical Provider, MD  Insulin Glargine (BASAGLAR KWIKPEN) 100 UNIT/ML SOPN Inject 42 Units into the skin at bedtime.   Yes Historical Provider, MD  insulin glargine (LANTUS) 100 UNIT/ML injection Inject 52 Units into the skin at bedtime.    Yes Historical Provider, MD  Iron-FA-B Cmp-C-Biot-Probiotic (FUSION PLUS) CAPS Take 1 capsule by mouth daily.   Yes Historical Provider, MD  losartan (COZAAR) 100 MG tablet Take 1 tablet (100 mg total) by mouth daily. 12/23/15  Yes Ripudeep Krystal Eaton, MD  metFORMIN (GLUCOPHAGE) 1000 MG tablet Take 1,000-1,500 mg by mouth daily with breakfast. Takes 1500 mg in the mornings and 1000 mg in the evenings.   Yes Historical Provider, MD  pantoprazole (PROTONIX) 40 MG tablet Take 1 tablet (40 mg total) by mouth daily. 12/23/15  Yes Ripudeep Krystal Eaton, MD  sertraline (ZOLOFT) 50 MG tablet Take 50 mg by mouth daily. 02/15/16  Yes Historical Provider, MD    Physical Exam: Vitals:   03/14/16 1645 03/14/16 1700 03/14/16 1715 03/14/16 1730  BP:  160/91 153/93 167/93  Pulse:  114 112 113  Resp: 19     Temp:      TempSrc:      SpO2:  100% 99% 99%  Weight:      Height:  Constitutional: NAD, calm, comfortable Vitals:   03/14/16 1645 03/14/16 1700 03/14/16 1715 03/14/16 1730  BP:  160/91 153/93 167/93  Pulse:  114 112 113  Resp: 19     Temp:      TempSrc:      SpO2:  100% 99% 99%  Weight:      Height:       Eyes: PERRL, lids and conjunctivae normal ENMT: Mucous membranes are moist. Posterior pharynx clear of any exudate or lesions.Normal dentition.  Neck: normal, supple, no masses, no thyromegaly Respiratory: clear to auscultation bilaterally, no wheezing, no crackles. Normal respiratory effort. No accessory muscle use.  Cardiovascular: Regular rate and rhythm, no murmurs / rubs / gallops. No extremity edema. 2+ pedal  pulses. No carotid bruits.  Abdomen: no tenderness, no masses palpated. No hepatosplenomegaly. Bowel sounds positive.  Musculoskeletal: no clubbing / cyanosis. No joint deformity upper and lower extremities. Good ROM, no contractures. Normal muscle tone.  Skin: no rashes, lesions, ulcers. No induration Neurologic: CN 2-12 grossly intact. Sensation intact, DTR normal. Strength 5/5 in all 4.  Psychiatric: Normal judgment and insight. Alert and oriented x 3. Normal mood.   Labs on Admission: I have personally reviewed following labs and imaging studies  CBC:  Recent Labs Lab 03/14/16 1248  WBC 10.8*  NEUTROABS 8.9*  HGB 11.9*  HCT 35.9*  MCV 81.8  PLT 99991111   Basic Metabolic Panel:  Recent Labs Lab 03/14/16 1248  NA 136  K 3.7  CL 104  CO2 22  GLUCOSE 283*  BUN 12  CREATININE 0.78  CALCIUM 8.9   GFR: Estimated Creatinine Clearance: 125.1 mL/min (by C-G formula based on SCr of 0.8 mg/dL). Liver Function Tests:  Recent Labs Lab 03/14/16 1248  AST 16  ALT 13*  ALKPHOS 60  BILITOT 1.2  PROT 7.4  ALBUMIN 3.4*   No results for input(s): LIPASE, AMYLASE in the last 168 hours. No results for input(s): AMMONIA in the last 168 hours. Coagulation Profile: No results for input(s): INR, PROTIME in the last 168 hours. Cardiac Enzymes: No results for input(s): CKTOTAL, CKMB, CKMBINDEX, TROPONINI in the last 168 hours. BNP (last 3 results) No results for input(s): PROBNP in the last 8760 hours. HbA1C: No results for input(s): HGBA1C in the last 72 hours. CBG: No results for input(s): GLUCAP in the last 168 hours. Lipid Profile: No results for input(s): CHOL, HDL, LDLCALC, TRIG, CHOLHDL, LDLDIRECT in the last 72 hours. Thyroid Function Tests: No results for input(s): TSH, T4TOTAL, FREET4, T3FREE, THYROIDAB in the last 72 hours. Anemia Panel: No results for input(s): VITAMINB12, FOLATE, FERRITIN, TIBC, IRON, RETICCTPCT in the last 72 hours. Urine analysis:    Component  Value Date/Time   COLORURINE AMBER (A) 03/14/2016 1306   APPEARANCEUR CLOUDY (A) 03/14/2016 1306   LABSPEC 1.036 (H) 03/14/2016 1306   PHURINE 5.5 03/14/2016 1306   GLUCOSEU >1000 (A) 03/14/2016 1306   HGBUR MODERATE (A) 03/14/2016 1306   BILIRUBINUR SMALL (A) 03/14/2016 1306   KETONESUR NEGATIVE 03/14/2016 1306   PROTEINUR >300 (A) 03/14/2016 1306   UROBILINOGEN 0.2 08/21/2012 2130   NITRITE NEGATIVE 03/14/2016 1306   LEUKOCYTESUR NEGATIVE 03/14/2016 1306   Sepsis Labs: @LABRCNTIP (procalcitonin:4,lacticidven:4) )No results found for this or any previous visit (from the past 240 hour(s)).   Radiological Exams on Admission: Dg Chest 2 View  Result Date: 03/14/2016 CLINICAL DATA:  Weakness, vomiting and fever beginning yesterday. EXAM: CHEST  2 VIEW COMPARISON:  PA and lateral chest 01/06/2016 and 07/05/2015. FINDINGS:  The lungs are clear. Heart size is normal. No pneumothorax or pleural effusion. No focal bony abnormality. IMPRESSION: No acute disease. Electronically Signed   By: Inge Rise M.D.   On: 03/14/2016 13:33   Ct Head Wo Contrast  Result Date: 03/14/2016 CLINICAL DATA:  Headache, fever, and vomiting. EXAM: CT HEAD WITHOUT CONTRAST TECHNIQUE: Contiguous axial images were obtained from the base of the skull through the vertex without intravenous contrast. COMPARISON:  CT scan dated 12/17/2009 FINDINGS: Brain: No mass lesion. No midline shift. No acute hemorrhage or hematoma. No extra-axial fluid collections. No evidence of acute infarction. Brain parenchyma is normal. Vascular: Normal. Skull: Normal. Sinuses/Orbits: Normal. IMPRESSION: Normal exam. Electronically Signed   By: Lorriane Shire M.D.   On: 03/14/2016 13:52   Dg Fluoro Guided Loc Of Needle/cath Tip For Spinal Inject Lt  Result Date: 03/14/2016 CLINICAL DATA:  Headache and neck pain.  Rule out meningitis. EXAM: DIAGNOSTIC LUMBAR PUNCTURE UNDER FLUOROSCOPIC GUIDANCE FLUOROSCOPY TIME:  If the device does not provide  the exposure index: Fluoroscopy Time (in minutes and seconds):  3 minutes and 54 seconds Number of Acquired Images:  None PROCEDURE: Informed consent was obtained from the patient prior to the procedure, including potential complications of headache, allergy, and pain. With the patient prone, the lower back was prepped with Betadine. 1% Lidocaine was used for local anesthesia. Lumbar puncture was attempted at the L4-5 level using a 20 gauge needle. Procedure complicated by patient body habitus. Despite multiple attempts and multiple times repositioning of the needle, the thecal sac could not be entered. IMPRESSION: Unsuccessful, attempted lumbar puncture, as detailed above. No acute complication. Electronically Signed   By: Abigail Miyamoto M.D.   On: 03/14/2016 16:50    EKG: Independently reviewed. NSR, no acute ST T wave changes Assessment/Plan  1. Suspected meningitis -admitted with fever/HA/Nausea and Vomiting, neck stiffness and photophobia -LP attempted 3 times , last time under fluoroscopy today -check MRI for meningeal enhancement -request LP under fluoroscopy tomorrow if possible after discussion with Radiologist -Empiric Rx with IV Vancomycin and Ceftriaxone -IVF, supportive care  2. DM -resume lantus at lower dose -SSI, hold metformin and glipizide  3. HTN -stable, continue losartan  DVT prophylaxis: SCDs Code Status: Full Code Family Communication: None at bedside Disposition Plan: home when improved Admission status: inpatient   Domenic Polite MD [facility] Pager [phone]  If 7PM-7AM, please contact night-coverage [email or web address] Password [facility]  03/14/2016, 5:52 PM

## 2016-03-14 NOTE — ED Notes (Signed)
MD at bedside. 

## 2016-03-14 NOTE — ED Notes (Signed)
Patient transported to X-ray 

## 2016-03-15 DIAGNOSIS — B9561 Methicillin susceptible Staphylococcus aureus infection as the cause of diseases classified elsewhere: Secondary | ICD-10-CM | POA: Diagnosis present

## 2016-03-15 DIAGNOSIS — R7881 Bacteremia: Principal | ICD-10-CM

## 2016-03-15 DIAGNOSIS — R509 Fever, unspecified: Secondary | ICD-10-CM

## 2016-03-15 DIAGNOSIS — B955 Unspecified streptococcus as the cause of diseases classified elsewhere: Secondary | ICD-10-CM | POA: Diagnosis present

## 2016-03-15 LAB — BLOOD CULTURE ID PANEL (REFLEXED)
ACINETOBACTER BAUMANNII: NOT DETECTED
Candida albicans: NOT DETECTED
Candida glabrata: NOT DETECTED
Candida krusei: NOT DETECTED
Candida parapsilosis: NOT DETECTED
Candida tropicalis: NOT DETECTED
ENTEROCOCCUS SPECIES: NOT DETECTED
Enterobacter cloacae complex: NOT DETECTED
Enterobacteriaceae species: NOT DETECTED
Escherichia coli: NOT DETECTED
HAEMOPHILUS INFLUENZAE: NOT DETECTED
Klebsiella oxytoca: NOT DETECTED
Klebsiella pneumoniae: NOT DETECTED
LISTERIA MONOCYTOGENES: NOT DETECTED
METHICILLIN RESISTANCE: NOT DETECTED
NEISSERIA MENINGITIDIS: NOT DETECTED
PROTEUS SPECIES: NOT DETECTED
PSEUDOMONAS AERUGINOSA: NOT DETECTED
SERRATIA MARCESCENS: NOT DETECTED
STAPHYLOCOCCUS AUREUS BCID: DETECTED — AB
STAPHYLOCOCCUS SPECIES: DETECTED — AB
STREPTOCOCCUS AGALACTIAE: DETECTED — AB
STREPTOCOCCUS PNEUMONIAE: NOT DETECTED
STREPTOCOCCUS SPECIES: DETECTED — AB
Streptococcus pyogenes: NOT DETECTED

## 2016-03-15 LAB — COMPREHENSIVE METABOLIC PANEL
ALT: 11 U/L — AB (ref 14–54)
AST: 13 U/L — AB (ref 15–41)
Albumin: 2.3 g/dL — ABNORMAL LOW (ref 3.5–5.0)
Alkaline Phosphatase: 47 U/L (ref 38–126)
Anion gap: 6 (ref 5–15)
BUN: 9 mg/dL (ref 6–20)
CHLORIDE: 106 mmol/L (ref 101–111)
CO2: 23 mmol/L (ref 22–32)
CREATININE: 0.66 mg/dL (ref 0.44–1.00)
Calcium: 7.5 mg/dL — ABNORMAL LOW (ref 8.9–10.3)
GFR calc Af Amer: >60 mL/min (ref >60)
GFR calc non Af Amer: >60 mL/min (ref >60)
GLUCOSE: 311 mg/dL — AB (ref 65–99)
Potassium: 3.3 mmol/L — ABNORMAL LOW (ref 3.5–5.1)
SODIUM: 135 mmol/L (ref 135–145)
Total Bilirubin: 0.5 mg/dL (ref 0.3–1.2)
Total Protein: 5.1 g/dL — ABNORMAL LOW (ref 6.5–8.1)

## 2016-03-15 LAB — GLUCOSE, CAPILLARY
GLUCOSE-CAPILLARY: 310 mg/dL — AB (ref 65–99)
Glucose-Capillary: 263 mg/dL — ABNORMAL HIGH (ref 65–99)
Glucose-Capillary: 152 mg/dL — ABNORMAL HIGH (ref 65–99)
Glucose-Capillary: 182 mg/dL — ABNORMAL HIGH (ref 65–99)

## 2016-03-15 LAB — CBC
HCT: 28.4 % — ABNORMAL LOW (ref 36.0–46.0)
Hemoglobin: 9.5 g/dL — ABNORMAL LOW (ref 12.0–15.0)
MCH: 27.5 pg (ref 26.0–34.0)
MCHC: 33.5 g/dL (ref 30.0–36.0)
MCV: 82.1 fL (ref 78.0–100.0)
PLATELETS: 260 10*3/uL (ref 150–400)
RBC: 3.46 MIL/uL — AB (ref 3.87–5.11)
RDW: 13.7 % (ref 11.5–15.5)
WBC: 6.5 10*3/uL (ref 4.0–10.5)

## 2016-03-15 LAB — URINE CULTURE

## 2016-03-15 MED ORDER — INSULIN ASPART 100 UNIT/ML ~~LOC~~ SOLN
0.0000 [IU] | Freq: Three times a day (TID) | SUBCUTANEOUS | Status: DC
  Administered 2016-03-15: 3 [IU] via SUBCUTANEOUS
  Administered 2016-03-15: 11 [IU] via SUBCUTANEOUS
  Administered 2016-03-16 (×2): 2 [IU] via SUBCUTANEOUS
  Administered 2016-03-17: 3 [IU] via SUBCUTANEOUS
  Administered 2016-03-17: 5 [IU] via SUBCUTANEOUS
  Administered 2016-03-18: 2 [IU] via SUBCUTANEOUS

## 2016-03-15 MED ORDER — INSULIN ASPART 100 UNIT/ML ~~LOC~~ SOLN: 0.0000 [IU] | Freq: Three times a day (TID) | SUBCUTANEOUS | Status: DC

## 2016-03-15 MED ORDER — DEXTROSE 5 % IV SOLN
10.0000 mg/kg | Freq: Three times a day (TID) | INTRAVENOUS | Status: DC
  Administered 2016-03-15: 845 mg via INTRAVENOUS
  Filled 2016-03-15 (×2): qty 16.9

## 2016-03-15 MED ORDER — ENOXAPARIN SODIUM 40 MG/0.4ML ~~LOC~~ SOLN
40.0000 mg | SUBCUTANEOUS | Status: DC
  Administered 2016-03-15: 40 mg via SUBCUTANEOUS
  Filled 2016-03-15: qty 0.4

## 2016-03-15 MED ORDER — INSULIN ASPART 100 UNIT/ML ~~LOC~~ SOLN: 0.0000 [IU] | Freq: Every day | SUBCUTANEOUS | Status: DC

## 2016-03-15 MED ORDER — CEFTRIAXONE SODIUM 2 G IJ SOLR
2.0000 g | INTRAMUSCULAR | Status: DC
Start: 2016-03-16 — End: 2016-03-18
  Administered 2016-03-16 – 2016-03-18 (×3): 2 g via INTRAVENOUS
  Filled 2016-03-15 (×4): qty 2

## 2016-03-15 NOTE — Care Management Note (Signed)
Case Management Note  Patient Details  Name: Jacqueline [name] MRN: [id number] Date of Birth: May 31, 1981  Subjective/Objective:                 Independent patient form home with family, insured, follows with PCP. Admitted for R/O Meningitis, LP unsuccessful X3, on Antivirals and Antibiotics. + Bld Cx.   Action/Plan:  No CM needs identified at this time. Will continue to follow for home Abx needs if indicated.  Expected Discharge Date:                  Expected Discharge Plan:  Home/Self Care  In-House Referral:  NA  Discharge planning Services  CM Consult  Post Acute Care Choice:  NA Choice offered to:  NA  DME Arranged:  N/A DME Agency:  NA  HH Arranged:    [place] Agency:  NA  Status of Service:  Completed, signed off  If discussed at [place] of Stay Meetings, dates discussed:    Additional Comments:  Carles Collet, RN 03/15/2016, 10:16 AM

## 2016-03-15 NOTE — Progress Notes (Signed)
Inpatient Diabetes Program Recommendations  AACE/ADA: New Consensus Statement on Inpatient Glycemic Control (2015)  Target Ranges:  Prepandial:   less than 140 mg/dL      Peak postprandial:   less than 180 mg/dL (1-2 hours)      Critically ill patients:  140 - 180 mg/dL   Lab Results  Component Value Date   GLUCAP 310 (H) 03/15/2016   HGBA1C 11.6 (H) 12/23/2015    Review of Glycemic Control:  Results for ELOYCE, FLEGEL (MRN [id number]) as of 03/15/2016 14:52  Ref. Range 03/14/2016 22:00 03/15/2016 08:22 03/15/2016 11:56  Glucose-Capillary Latest Ref Range: 65 - 99 mg/dL 256 (H) 263 (H) 310 (H)   Diabetes history: Type 2 diabetes Outpatient Diabetes medications: Lantus 52 units q HS, Metformin 1000-1500 mg with breakfast, Glucotrol 10 mg daily Current orders for Inpatient glycemic control:  Lantus 45 units q HS, Novolog moderate tid with meals and HS  Inpatient Diabetes Program Recommendations:   Please increase Lantus to 52 units q HS.  Also patient would likely benefit from the addition of Novolog meal coverage 6 units tid with meals-Hold if patient eats less than 50%.    Thanks, Adah Perl, RN, BC-ADM Inpatient Diabetes Coordinator Pager [phone] (8a-5p)

## 2016-03-15 NOTE — Progress Notes (Signed)
Pharmacy Antibiotic Note  Jacqueline [name] is a 35 y.o. female admitted on 03/14/2016 with suspected meningitis.  Pharmacy has been consulted for ceftriaxone and vancomycin dosing, now to add acyclovir. LP attempted 3x yesterday but was unsuccessful. BCID positive for Staph aureus and Strep agalactiae this morning. Patient is obese and renal function is normal.  Plan: Acyclovir 845 mg IV q8h (dose based on ABW) Continue vancomycin 1 g IV q8h Continue ceftriaxone 2 g IV q12h Follow-up cultures, renal function, LOT, VT if needed  Height: 5\' 4"  (162.6 cm) Weight: 285 lb 6.4 oz (129.5 kg) IBW/kg (Calculated) : 54.7  ABW 84.6 kg  Temp (24hrs), Avg:100.6 F (38.1 C), Min:98.3 F (36.8 C), Max:102.4 F (39.1 C)   Recent Labs Lab 03/14/16 1248 03/14/16 1256 03/14/16 1704 03/15/16 0459  WBC 10.8*  --   --  6.5  CREATININE 0.78  --   --  0.66  LATICACIDVEN  --  1.91* 2.12*  --     Estimated Creatinine Clearance: 129.8 mL/min (by C-G formula based on SCr of 0.8 mg/dL).    No Known Allergies  Antimicrobials this admission: Vancomycin 8/28 >> Ceftriaxone 8/28 >> Acyclovir 8/29 >>  Dose adjustments this admission: n/a  Microbiology results: 8/28 BCID: MSSA + Strep agalactiae 8/28 BCx: 1/2 GPC chains/pairs 8/28 UCx: collected  Thank you for allowing pharmacy to be a part of this patient's care.  Renold Genta, PharmD, BCPS Clinical Pharmacist Phone for today - [place] - [phone] 03/15/2016 8:14 AM

## 2016-03-15 NOTE — Progress Notes (Addendum)
PROGRESS NOTE    Jacqueline [name]  [id number] DOB: 16-Feb-1981 DOA: 03/14/2016 PCP: Eloise Levels, NP  Brief Narrative: Jacqueline [name] is a 35 y.o. female with medical history significant of DM, HTN, obesity, depression presents to the ER with the above complaints. Pt reports experiencing a headache, nausea and chills 8/27 evening, subsequently tried to sleep and woke up at midnight with a severe headache, fever, neck pain/chills and vomited x2. Vomitus was non bloody and non bilious. After this had one episode of diarrhea. Came to the ER 8/28 and LP was attempted x2 by EDP and once by IR under fluorocopy all unsuccessful -due to body habitus. Then found to have bacteremia -Strep and Staph, ID consulted, source unclear  Assessment & Plan:   1. Staph and Strep Bacteremia -Etiology unclear, staph could be MSSA or Coagulase negative -symptoms of meningitis on admission but LP couldn't be done despite 3 attempts and last under fluoroscopy -On Day 2 of IV Vanc and Ceftriaxone -ID consulted, d/w Dr.Campbell  2. DM -continue lantus, SSI  3. HTN -continue losartan  DVT prophylaxis:SCDs Code Status:Full Code Family Communication: none at bedside Disposition Plan: Pending further workup and management   Consultants:  ID Dr.Campbell Antimicrobials: Vanc/Ceftriaxone  Subjective: Feels better, some HA, no further N/V  Objective: Vitals:   03/14/16 1902 03/14/16 2201 03/15/16 0533 03/15/16 1320  BP: (!) 164/78 139/68 (!) 153/75 (!) 171/92  Pulse: (!) 123 (!) 113 98 (!) 110  Resp:  18 17 15   Temp: 100.3 F (37.9 C) (!) 100.7 F (38.2 C) 98.3 F (36.8 C) 99.2 F (37.3 C)  TempSrc: Oral  Oral Oral  SpO2: 100% 99% 99% 99%  Weight: 129.5 kg (285 lb 6.4 oz)     Height: 5\' 4"  (1.626 m)       Intake/Output Summary (Last 24 hours) at 03/15/16 1333 Last data filed at 03/15/16 1108  Gross per 24 hour  Intake              120 ml  Output                0 ml  Net              120 ml    Filed Weights   03/14/16 1240 03/14/16 1902  Weight: 121.6 kg (268 lb) 129.5 kg (285 lb 6.4 oz)    Examination:  General exam: Appears calm and comfortable, AAOx3, no distress Respiratory system: Clear to auscultation. Respiratory effort normal. Cardiovascular system: S1 & S2 heard, RRR. No JVD, murmurs, rubs, gallops or clicks. No pedal edema. Gastrointestinal system: Abdomen is nondistended, soft and nontender. No organomegaly or masses felt. Normal bowel sounds heard. Central nervous system: Alert and oriented. No focal neurological deficits. Extremities: Symmetric 5 x 5 power. Skin: No rashes, lesions or ulcers Psychiatry: Judgement and insight appear normal. Mood & affect appropriate.     Data Reviewed: I have personally reviewed following labs and imaging studies  CBC:  Recent Labs Lab 03/14/16 1248 03/15/16 0459  WBC 10.8* 6.5  NEUTROABS 8.9*  --   HGB 11.9* 9.5*  HCT 35.9* 28.4*  MCV 81.8 82.1  PLT 310 [id number]   Basic Metabolic Panel:  Recent Labs Lab 03/14/16 1248 03/15/16 0459  NA 136 135  K 3.7 3.3*  CL 104 106  CO2 22 23  GLUCOSE 283* 311*  BUN 12 9  CREATININE 0.78 0.66  CALCIUM 8.9 7.5*   GFR: Estimated Creatinine Clearance: 129.8 mL/min (by C-G formula based  on SCr of 0.8 mg/dL). Liver Function Tests:  Recent Labs Lab 03/14/16 1248 03/15/16 0459  AST 16 13*  ALT 13* 11*  ALKPHOS 60 47  BILITOT 1.2 0.5  PROT 7.4 5.1*  ALBUMIN 3.4* 2.3*   No results for input(s): LIPASE, AMYLASE in the last 168 hours. No results for input(s): AMMONIA in the last 168 hours. Coagulation Profile: No results for input(s): INR, PROTIME in the last 168 hours. Cardiac Enzymes: No results for input(s): CKTOTAL, CKMB, CKMBINDEX, TROPONINI in the last 168 hours. BNP (last 3 results) No results for input(s): PROBNP in the last 8760 hours. HbA1C: No results for input(s): HGBA1C in the last 72 hours. CBG:  Recent Labs Lab 03/14/16 2200 03/15/16 0822  03/15/16 1156  GLUCAP 256* 263* 310*   Lipid Profile: No results for input(s): CHOL, HDL, LDLCALC, TRIG, CHOLHDL, LDLDIRECT in the last 72 hours. Thyroid Function Tests: No results for input(s): TSH, T4TOTAL, FREET4, T3FREE, THYROIDAB in the last 72 hours. Anemia Panel: No results for input(s): VITAMINB12, FOLATE, FERRITIN, TIBC, IRON, RETICCTPCT in the last 72 hours. Urine analysis:    Component Value Date/Time   COLORURINE AMBER (A) 03/14/2016 1306   APPEARANCEUR CLOUDY (A) 03/14/2016 1306   LABSPEC 1.036 (H) 03/14/2016 1306   PHURINE 5.5 03/14/2016 1306   GLUCOSEU >1000 (A) 03/14/2016 1306   HGBUR MODERATE (A) 03/14/2016 1306   BILIRUBINUR SMALL (A) 03/14/2016 1306   KETONESUR NEGATIVE 03/14/2016 1306   PROTEINUR >300 (A) 03/14/2016 1306   UROBILINOGEN 0.2 08/21/2012 2130   NITRITE NEGATIVE 03/14/2016 1306   LEUKOCYTESUR NEGATIVE 03/14/2016 1306   Sepsis Labs: @LABRCNTIP (procalcitonin:4,lacticidven:4)  ) Recent Results (from the past 240 hour(s))  Blood Culture (routine x 2)     Status: None (Preliminary result)   Collection Time: 03/14/16 12:20 PM  Result Value Ref Range Status   Specimen Description BLOOD RIGHT ANTECUBITAL  Final   Special Requests BOTTLES DRAWN AEROBIC AND ANAEROBIC 5ML EACH  Final   Culture  Setup Time   Final    GRAM POSITIVE COCCI IN CHAINS IN PAIRS IN BOTH AEROBIC AND ANAEROBIC BOTTLES CRITICAL RESULT CALLED TO, READ BACK BY AND VERIFIED WITHSherlon Handing, PHARD AT [id number] 03/15/16 MKELLY    Culture GRAM POSITIVE COCCI  Final   Report Status PENDING  Incomplete  Blood Culture ID Panel (Reflexed)     Status: Abnormal   Collection Time: 03/14/16 12:20 PM  Result Value Ref Range Status   Enterococcus species NOT DETECTED NOT DETECTED Final   Listeria monocytogenes NOT DETECTED NOT DETECTED Final   Staphylococcus species DETECTED (A) NOT DETECTED Final    Comment: CRITICAL RESULT CALLED TO, READ BACK BY AND VERIFIED WITH: CARON AMEND, PHARMD @0509   03/15/16 MKELLY    Staphylococcus aureus DETECTED (A) NOT DETECTED Final    Comment: CRITICAL RESULT CALLED TO, READ BACK BY AND VERIFIED WITH: CARON AMEND,PHARMD @0509  03/15/16 MKELLY    Methicillin resistance NOT DETECTED NOT DETECTED Final   Streptococcus species DETECTED (A) NOT DETECTED Final    Comment: CRITICAL RESULT CALLED TO, READ BACK BY AND VERIFIED WITH: CARON AMEND,PHARMD @0509  03/15/16 MKELLY    Streptococcus agalactiae DETECTED (A) NOT DETECTED Final    Comment: CRITICAL RESULT CALLED TO, READ BACK BY AND VERIFIED WITH: CARON AMEND,PHARMD @0509  03/15/16 MKELLY    Streptococcus pneumoniae NOT DETECTED NOT DETECTED Final   Streptococcus pyogenes NOT DETECTED NOT DETECTED Final   Acinetobacter baumannii NOT DETECTED NOT DETECTED Final   Enterobacteriaceae species NOT DETECTED NOT DETECTED Final  Enterobacter cloacae complex NOT DETECTED NOT DETECTED Final   Escherichia coli NOT DETECTED NOT DETECTED Final   Klebsiella oxytoca NOT DETECTED NOT DETECTED Final   Klebsiella pneumoniae NOT DETECTED NOT DETECTED Final   Proteus species NOT DETECTED NOT DETECTED Final   Serratia marcescens NOT DETECTED NOT DETECTED Final   Haemophilus influenzae NOT DETECTED NOT DETECTED Final   Neisseria meningitidis NOT DETECTED NOT DETECTED Final   Pseudomonas aeruginosa NOT DETECTED NOT DETECTED Final   Candida albicans NOT DETECTED NOT DETECTED Final   Candida glabrata NOT DETECTED NOT DETECTED Final   Candida krusei NOT DETECTED NOT DETECTED Final   Candida parapsilosis NOT DETECTED NOT DETECTED Final   Candida tropicalis NOT DETECTED NOT DETECTED Final  Blood Culture (routine x 2)     Status: None (Preliminary result)   Collection Time: 03/14/16 12:50 PM  Result Value Ref Range Status   Specimen Description BLOOD RIGHT HAND  Final   Special Requests BOTTLES DRAWN AEROBIC AND ANAEROBIC 5ML EACH  Final   Culture NO GROWTH < 24 HOURS  Final   Report Status PENDING  Incomplete          Radiology Studies: Dg Chest 2 View  Result Date: 03/14/2016 CLINICAL DATA:  Weakness, vomiting and fever beginning yesterday. EXAM: CHEST  2 VIEW COMPARISON:  PA and lateral chest 01/06/2016 and 07/05/2015. FINDINGS: The lungs are clear. Heart size is normal. No pneumothorax or pleural effusion. No focal bony abnormality. IMPRESSION: No acute disease. Electronically Signed   By: Inge Rise M.D.   On: 03/14/2016 13:33   Ct Head Wo Contrast  Result Date: 03/14/2016 CLINICAL DATA:  Headache, fever, and vomiting. EXAM: CT HEAD WITHOUT CONTRAST TECHNIQUE: Contiguous axial images were obtained from the base of the skull through the vertex without intravenous contrast. COMPARISON:  CT scan dated 12/17/2009 FINDINGS: Brain: No mass lesion. No midline shift. No acute hemorrhage or hematoma. No extra-axial fluid collections. No evidence of acute infarction. Brain parenchyma is normal. Vascular: Normal. Skull: Normal. Sinuses/Orbits: Normal. IMPRESSION: Normal exam. Electronically Signed   By: Lorriane Shire M.D.   On: 03/14/2016 13:52   Mr Jeri Cos [id number] Contrast  Result Date: 03/15/2016 CLINICAL DATA:  Headache, nausea and chills. Fever and neck pain. Vomiting. Status post 2 lumbar puncture attempts. EXAM: MRI HEAD WITHOUT AND WITH CONTRAST TECHNIQUE: Multiplanar, multiecho pulse sequences of the brain and surrounding structures were obtained without and with intravenous contrast. CONTRAST:  63mL MULTIHANCE GADOBENATE DIMEGLUMINE 529 MG/ML IV SOLN COMPARISON:  Head CT 03/14/2016 FINDINGS: Brain Parenchyma: No acute infarct or intraparenchymal hemorrhage. No focal parenchymal signal abnormality. No mass lesion or midline shift. The major intracranial flow voids are preserved. The midline structures are normal. Ventricles, Sulci and Extra-axial Spaces: Normal for age. No extra-axial collection. Paranasal Sinuses and Mastoids: No fluid levels or advanced mucosal thickening. Orbits: Normal. Bones and  Soft Tissues: The visualized skull base, calvarium and extracranial soft tissues are normal. IMPRESSION: Normal brain MRI. Electronically Signed   By: Ulyses Jarred M.D.   On: 03/15/2016 00:09   Dg Fluoro Guided Loc Of Needle/cath Tip For Spinal Inject Lt  Result Date: 03/14/2016 CLINICAL DATA:  Headache and neck pain.  Rule out meningitis. EXAM: DIAGNOSTIC LUMBAR PUNCTURE UNDER FLUOROSCOPIC GUIDANCE FLUOROSCOPY TIME:  If the device does not provide the exposure index: Fluoroscopy Time (in minutes and seconds):  3 minutes and 54 seconds Number of Acquired Images:  None PROCEDURE: Informed consent was obtained from the patient prior to the procedure,  including potential complications of headache, allergy, and pain. With the patient prone, the lower back was prepped with Betadine. 1% Lidocaine was used for local anesthesia. Lumbar puncture was attempted at the L4-5 level using a 20 gauge needle. Procedure complicated by patient body habitus. Despite multiple attempts and multiple times repositioning of the needle, the thecal sac could not be entered. IMPRESSION: Unsuccessful, attempted lumbar puncture, as detailed above. No acute complication. Electronically Signed   By: Abigail Miyamoto M.D.   On: 03/14/2016 16:50        Scheduled Meds: . cefTRIAXone (ROCEPHIN)  IV  2 g Intravenous Q12H  . insulin aspart  0-15 Units Subcutaneous TID WC  . insulin aspart  0-5 Units Subcutaneous QHS  . insulin glargine  45 Units Subcutaneous QHS  . losartan  100 mg Oral Daily  . pantoprazole  40 mg Oral Daily  . sertraline  50 mg Oral Daily  . vancomycin  1,000 mg Intravenous Q8H   Continuous Infusions: . sodium chloride 100 mL/hr at 03/15/16 1039     LOS: 1 day    Time spent: 61min    Domenic Polite, MD [facility] Pager [phone]  If 7PM-7AM, please contact night-coverage [email or web address] Password [email or web address] 03/15/2016, 1:33 PM

## 2016-03-15 NOTE — Progress Notes (Signed)
PHARMACY - PHYSICIAN COMMUNICATION CRITICAL VALUE ALERT - BLOOD CULTURE IDENTIFICATION (BCID)  Results for orders placed or performed during the hospital encounter of 03/14/16  Blood Culture ID Panel (Reflexed) (Collected: 03/14/2016 12:20 PM)  Result Value Ref Range   Enterococcus species NOT DETECTED NOT DETECTED   Listeria monocytogenes NOT DETECTED NOT DETECTED   Staphylococcus species DETECTED (A) NOT DETECTED   Staphylococcus aureus DETECTED (A) NOT DETECTED   Methicillin resistance NOT DETECTED NOT DETECTED   Streptococcus species DETECTED (A) NOT DETECTED   Streptococcus agalactiae DETECTED (A) NOT DETECTED   Streptococcus pneumoniae NOT DETECTED NOT DETECTED   Streptococcus pyogenes NOT DETECTED NOT DETECTED   Acinetobacter baumannii NOT DETECTED NOT DETECTED   Enterobacteriaceae species NOT DETECTED NOT DETECTED   Enterobacter cloacae complex NOT DETECTED NOT DETECTED   Escherichia coli NOT DETECTED NOT DETECTED   Klebsiella oxytoca NOT DETECTED NOT DETECTED   Klebsiella pneumoniae NOT DETECTED NOT DETECTED   Proteus species NOT DETECTED NOT DETECTED   Serratia marcescens NOT DETECTED NOT DETECTED   Haemophilus influenzae NOT DETECTED NOT DETECTED   Neisseria meningitidis NOT DETECTED NOT DETECTED   Pseudomonas aeruginosa NOT DETECTED NOT DETECTED   Candida albicans NOT DETECTED NOT DETECTED   Candida glabrata NOT DETECTED NOT DETECTED   Candida krusei NOT DETECTED NOT DETECTED   Candida parapsilosis NOT DETECTED NOT DETECTED   Candida tropicalis NOT DETECTED NOT DETECTED    Name of physician (or Provider) Contacted: Forrest Moron, NP  Changes to prescribed antibiotics required: Continue same antibiotics (Vanc and Rocephin) for now. Consider narrow in future.  Sherlon Handing, PharmD, BCPS Clinical pharmacist, pager [phone] 03/15/2016  6:20 AM

## 2016-03-15 NOTE — Consult Note (Addendum)
[facility] for Infect[facility]    Date of Admission:  03/14/2016           Day 2 vancomycin        Day 2 ceftriaxone       Reason for Consult: Staph aureus and group B streptococcal bacteremia    Referring Physician: Dr. Domenic Polite  Active Problems:   Staphylococcus aureus bacteremia   Streptococcal bacteremia   Tachycardia   Fever   Headache   Diabetes ([place])   HTN (hypertension)   Morbid obesity ([place])   . cefTRIAXone (ROCEPHIN)  IV  2 g Intravenous Q12H  . enoxaparin (LOVENOX) injection  40 mg Subcutaneous Q24H  . insulin aspart  0-15 Units Subcutaneous TID WC  . insulin aspart  0-5 Units Subcutaneous QHS  . insulin glargine  45 Units Subcutaneous QHS  . losartan  100 mg Oral Daily  . pantoprazole  40 mg Oral Daily  . sertraline  50 mg Oral Daily  . vancomycin  1,000 mg Intravenous Q8H    Recommendations: 1. Continue ceftriaxone 2 g IV daily 2. Discontinue vancomycin 3. Repeat blood cultures 4. Transthoracic echocardiogram 5. Discontinue droplet precautions   Assessment: She has polymicrobial bacteremia with MSSA and group B strep. The source is unclear. She seems to be responding to antibiotic therapy. I doubt that she has meningitis and would not reattempt lumbar puncture. I will narrow antibiotic therapy to ceftriaxone alone, repeat blood cultures and order a transthoracic echocardiogram.    HPI: Jacqueline [name] is a 35 y.o. female with diabetes who had sudden onset of fever, chills, headache, nausea and vomiting several days ago leading to admission yesterday. She had a temperature of 102.8. Lumbar puncture was attempted but failed. She was started on empiric vancomycin and ceftriaxone. One of 2 blood cultures has grown MSSA and group B strep. She is feeling a little bit better today.   Review of Systems: Review of Systems  Constitutional: Positive for chills, diaphoresis, fever and malaise/fatigue. Negative for weight loss.  HENT:  Negative for sore throat.   Respiratory: Positive for cough and shortness of breath. Negative for sputum production.   Cardiovascular: Negative for chest pain.  Gastrointestinal: Positive for diarrhea, nausea and vomiting.  Genitourinary: Negative for dysuria and frequency.  Musculoskeletal: Positive for myalgias. Negative for joint pain.  Skin: Negative for rash.  Neurological: Positive for headaches. Negative for dizziness.    Past Medical History:  Diagnosis Date  . Anemia   . Anxiety   . Chronic bronchitis ([place])   . Hypertension   . Morbid obesity ([place])   . Type II diabetes mellitus ([place])     Social History  Substance Use Topics  . Smoking status: Never Smoker  . Smokeless tobacco: Never Used  . Alcohol use No    Family History  Problem Relation Age of Onset  . Diabetes Mother   . Hypertension Mother   . Thyroid disease Mother   . Kidney disease Maternal Grandmother   . Diabetes Maternal Grandmother   . Heart attack     No Known Allergies  OBJECTIVE: Blood pressure (!) 171/92, pulse (!) 110, temperature 99.2 F (37.3 C), temperature source Oral, resp. rate 15, height 5\' 4"  (1.626 m), weight 285 lb 6.4 oz (129.5 kg), last menstrual period 03/04/2016, SpO2 99 %.  Physical Exam  Constitutional: She is oriented to person, place, and time.  She is resting quietly in bed. She is in  no distress.  HENT:  Mouth/Throat: No oropharyngeal exudate.  Eyes: Conjunctivae are normal.  Neck: Neck supple.  Cardiovascular: Normal rate and regular rhythm.   No murmur heard. Pulmonary/Chest: Effort normal and breath sounds normal. She has no wheezes. She has no rales.  Abdominal: Soft. She exhibits no mass. There is tenderness.  Epigastric tenderness with palpation.  Musculoskeletal: Normal range of motion. She exhibits no edema or tenderness.  Neurological: She is alert and oriented to person, place, and time.  Skin: No rash noted.  Psychiatric: Mood and affect normal.     Lab Results Lab Results  Component Value Date   WBC 6.5 03/15/2016   HGB 9.5 (L) 03/15/2016   HCT 28.4 (L) 03/15/2016   MCV 82.1 03/15/2016   PLT 260 03/15/2016    Lab Results  Component Value Date   CREATININE 0.66 03/15/2016   BUN 9 03/15/2016   NA 135 03/15/2016   K 3.3 (L) 03/15/2016   CL 106 03/15/2016   CO2 23 03/15/2016    Lab Results  Component Value Date   ALT 11 (L) 03/15/2016   AST 13 (L) 03/15/2016   ALKPHOS 47 03/15/2016   BILITOT 0.5 03/15/2016     Microbiology: Recent Results (from the past 240 hour(s))  Blood Culture (routine x 2)     Status: None (Preliminary result)   Collection Time: 03/14/16 12:20 PM  Result Value Ref Range Status   Specimen Description BLOOD RIGHT ANTECUBITAL  Final   Special Requests BOTTLES DRAWN AEROBIC AND ANAEROBIC 5ML EACH  Final   Culture  Setup Time   Final    GRAM POSITIVE COCCI IN CHAINS IN PAIRS IN BOTH AEROBIC AND ANAEROBIC BOTTLES CRITICAL RESULT CALLED TO, READ BACK BY AND VERIFIED WITHSherlon Handing, PHARD AT 0509 03/15/16 MKELLY    Culture GRAM POSITIVE COCCI  Final   Report Status PENDING  Incomplete  Blood Culture ID Panel (Reflexed)     Status: Abnormal   Collection Time: 03/14/16 12:20 PM  Result Value Ref Range Status   Enterococcus species NOT DETECTED NOT DETECTED Final   Listeria monocytogenes NOT DETECTED NOT DETECTED Final   Staphylococcus species DETECTED (A) NOT DETECTED Final    Comment: CRITICAL RESULT CALLED TO, READ BACK BY AND VERIFIED WITH: CARON AMEND, PHARMD @0509  03/15/16 MKELLY    Staphylococcus aureus DETECTED (A) NOT DETECTED Final    Comment: CRITICAL RESULT CALLED TO, READ BACK BY AND VERIFIED WITH: CARON AMEND,PHARMD @0509  03/15/16 MKELLY    Methicillin resistance NOT DETECTED NOT DETECTED Final   Streptococcus species DETECTED (A) NOT DETECTED Final    Comment: CRITICAL RESULT CALLED TO, READ BACK BY AND VERIFIED WITH: CARON AMEND,PHARMD @0509  03/15/16 MKELLY     Streptococcus agalactiae DETECTED (A) NOT DETECTED Final    Comment: CRITICAL RESULT CALLED TO, READ BACK BY AND VERIFIED WITH: CARON AMEND,PHARMD @0509  03/15/16 MKELLY    Streptococcus pneumoniae NOT DETECTED NOT DETECTED Final   Streptococcus pyogenes NOT DETECTED NOT DETECTED Final   Acinetobacter baumannii NOT DETECTED NOT DETECTED Final   Enterobacteriaceae species NOT DETECTED NOT DETECTED Final   Enterobacter cloacae complex NOT DETECTED NOT DETECTED Final   Escherichia coli NOT DETECTED NOT DETECTED Final   Klebsiella oxytoca NOT DETECTED NOT DETECTED Final   Klebsiella pneumoniae NOT DETECTED NOT DETECTED Final   Proteus species NOT DETECTED NOT DETECTED Final   Serratia marcescens NOT DETECTED NOT DETECTED Final   Haemophilus influenzae NOT DETECTED NOT DETECTED Final   Neisseria meningitidis NOT DETECTED  NOT DETECTED Final   Pseudomonas aeruginosa NOT DETECTED NOT DETECTED Final   Candida albicans NOT DETECTED NOT DETECTED Final   Candida glabrata NOT DETECTED NOT DETECTED Final   Candida krusei NOT DETECTED NOT DETECTED Final   Candida parapsilosis NOT DETECTED NOT DETECTED Final   Candida tropicalis NOT DETECTED NOT DETECTED Final  Blood Culture (routine x 2)     Status: None (Preliminary result)   Collection Time: 03/14/16 12:50 PM  Result Value Ref Range Status   Specimen Description BLOOD RIGHT HAND  Final   Special Requests BOTTLES DRAWN AEROBIC AND ANAEROBIC 5ML EACH  Final   Culture NO GROWTH < 24 HOURS  Final   Report Status PENDING  Incomplete  Urine culture     Status: Abnormal   Collection Time: 03/14/16  1:14 PM  Result Value Ref Range Status   Specimen Description URINE, RANDOM  Final   Special Requests NONE  Final   Culture MULTIPLE SPECIES PRESENT, SUGGEST RECOLLECTION (A)  Final   Report Status 03/15/2016 FINAL  Final    Michel Bickers, MD [facility] [phone] pager   [phone] cell 03/15/2016,  3:43 PM

## 2016-03-16 ENCOUNTER — Inpatient Hospital Stay (HOSPITAL_COMMUNITY): Payer: BLUE CROSS/BLUE SHIELD

## 2016-03-16 DIAGNOSIS — R7881 Bacteremia: Secondary | ICD-10-CM

## 2016-03-16 DIAGNOSIS — M791 Myalgia: Secondary | ICD-10-CM

## 2016-03-16 DIAGNOSIS — B954 Other streptococcus as the cause of diseases classified elsewhere: Secondary | ICD-10-CM

## 2016-03-16 DIAGNOSIS — R51 Headache: Secondary | ICD-10-CM

## 2016-03-16 DIAGNOSIS — I10 Essential (primary) hypertension: Secondary | ICD-10-CM

## 2016-03-16 LAB — BASIC METABOLIC PANEL
ANION GAP: 8 (ref 5–15)
BUN: 6 mg/dL (ref 6–20)
CO2: 24 mmol/L (ref 22–32)
Calcium: 7.5 mg/dL — ABNORMAL LOW (ref 8.9–10.3)
Chloride: 105 mmol/L (ref 101–111)
Creatinine, Ser: 0.61 mg/dL (ref 0.44–1.00)
GFR calc Af Amer: >60 mL/min (ref >60)
Glucose, Bld: 192 mg/dL — ABNORMAL HIGH (ref 65–99)
POTASSIUM: 3.1 mmol/L — AB (ref 3.5–5.1)
SODIUM: 137 mmol/L (ref 135–145)

## 2016-03-16 LAB — GLUCOSE, CAPILLARY
GLUCOSE-CAPILLARY: 133 mg/dL — AB (ref 65–99)
GLUCOSE-CAPILLARY: 132 mg/dL — AB (ref 65–99)
GLUCOSE-CAPILLARY: 138 mg/dL — AB (ref 65–99)
Glucose-Capillary: 171 mg/dL — ABNORMAL HIGH (ref 65–99)

## 2016-03-16 LAB — CBC
HEMATOCRIT: 28.3 % — AB (ref 36.0–46.0)
HEMOGLOBIN: 9.1 g/dL — AB (ref 12.0–15.0)
MCH: 26.4 pg (ref 26.0–34.0)
MCHC: 32.2 g/dL (ref 30.0–36.0)
MCV: 82 fL (ref 78.0–100.0)
Platelets: 257 10*3/uL (ref 150–400)
RBC: 3.45 MIL/uL — ABNORMAL LOW (ref 3.87–5.11)
RDW: 13.4 % (ref 11.5–15.5)
WBC: 8.4 10*3/uL (ref 4.0–10.5)

## 2016-03-16 LAB — ECHOCARDIOGRAM COMPLETE
Height: 64 in
Weight: 4566.4 oz

## 2016-03-16 MED ORDER — OXYCODONE HCL 5 MG PO TABS
5.0000 mg | ORAL_TABLET | ORAL | Status: DC | PRN
  Administered 2016-03-16 – 2016-03-17 (×3): 5 mg via ORAL
  Filled 2016-03-16 (×3): qty 1

## 2016-03-16 MED ORDER — DOCUSATE SODIUM 100 MG PO CAPS
100.0000 mg | ORAL_CAPSULE | Freq: Two times a day (BID) | ORAL | Status: DC
  Administered 2016-03-16 – 2016-03-17 (×3): 100 mg via ORAL
  Filled 2016-03-16 (×4): qty 1

## 2016-03-16 MED ORDER — POLYETHYLENE GLYCOL 3350 17 G PO PACK
17.0000 g | PACK | Freq: Every day | ORAL | Status: DC
  Administered 2016-03-16 – 2016-03-17 (×2): 17 g via ORAL
  Filled 2016-03-16 (×3): qty 1

## 2016-03-16 MED ORDER — PERFLUTREN LIPID MICROSPHERE
1.0000 mL | INTRAVENOUS | Status: AC | PRN
  Administered 2016-03-16: 2 mL via INTRAVENOUS
  Filled 2016-03-16: qty 10

## 2016-03-16 MED ORDER — POTASSIUM CHLORIDE CRYS ER 20 MEQ PO TBCR
40.0000 meq | EXTENDED_RELEASE_TABLET | Freq: Once | ORAL | Status: AC
  Administered 2016-03-16: 40 meq via ORAL
  Filled 2016-03-16: qty 2

## 2016-03-16 NOTE — Progress Notes (Signed)
Patient ID: Jacqueline [name], female   DOB: Jun 26, 1981, 35 y.o.   MRN: [id number]          [facility]    Date of Admission:  03/14/2016   Total days of antibiotics 3          Active Problems:   Staphylococcus aureus bacteremia   Streptococcal bacteremia   Tachycardia   Fever   Headache   Diabetes (HCC)   HTN (hypertension)   Morbid obesity ([place])   . cefTRIAXone (ROCEPHIN)  IV  2 g Intravenous Q24H  . docusate sodium  100 mg Oral BID  . enoxaparin (LOVENOX) injection  40 mg Subcutaneous Q24H  . insulin aspart  0-15 Units Subcutaneous TID WC  . insulin aspart  0-5 Units Subcutaneous QHS  . insulin glargine  45 Units Subcutaneous QHS  . losartan  100 mg Oral Daily  . pantoprazole  40 mg Oral Daily  . polyethylene glycol  17 g Oral Daily  . sertraline  50 mg Oral Daily    SUBJECTIVE: She is feeling a little bit better. She is still having some headache and myalgias.  Review of Systems: Review of Systems  Constitutional: Positive for malaise/fatigue. Negative for chills, diaphoresis, fever and weight loss.  HENT: Negative for sore throat.   Respiratory: Negative for cough, sputum production and shortness of breath.   Cardiovascular: Negative for chest pain.  Gastrointestinal: Negative for diarrhea, nausea and vomiting.  Genitourinary: Negative for dysuria and frequency.  Musculoskeletal: Positive for myalgias. Negative for joint pain.  Skin: Negative for rash.  Neurological: Positive for headaches. Negative for dizziness.  Psychiatric/Behavioral: Negative for depression and substance abuse. The patient is not nervous/anxious.     Past Medical History:  Diagnosis Date  . Anemia   . Anxiety   . Chronic bronchitis ([place])   . Hypertension   . Morbid obesity ([place])   . Type II diabetes mellitus ([place])     Social History  Substance Use Topics  . Smoking status: Never Smoker  . Smokeless tobacco: Never Used  . Alcohol use No    Family  History  Problem Relation Age of Onset  . Diabetes Mother   . Hypertension Mother   . Thyroid disease Mother   . Kidney disease Maternal Grandmother   . Diabetes Maternal Grandmother   . Heart attack     No Known Allergies  OBJECTIVE: Vitals:   03/15/16 1320 03/15/16 2105 03/16/16 0459 03/16/16 1514  BP: (!) 171/92 (!) 144/78 140/78 (!) 157/88  Pulse: (!) 110 (!) 106 94 93  Resp: 15 19 19 19   Temp: 99.2 F (37.3 C) 99.2 F (37.3 C) 98.4 F (36.9 C) 98.4 F (36.9 C)  TempSrc: Oral Oral Oral Oral  SpO2: 99% 100% 98% 99%  Weight:      Height:       Body mass index is 48.99 kg/m.  Physical Exam  Constitutional: She is oriented to person, place, and time.  She is resting quietly in bed.  Neck: Neck supple.  Cardiovascular: Normal rate and regular rhythm.   No murmur heard. Pulmonary/Chest: Effort normal and breath sounds normal.  Abdominal: Soft. There is no tenderness.  Musculoskeletal: Normal range of motion. She exhibits no edema or tenderness.  Neurological: She is alert and oriented to person, place, and time.  Skin: No rash noted.  Psychiatric: Mood and affect normal.    Lab Results Lab Results  Component Value Date   WBC 8.4 03/16/2016  HGB 9.1 (L) 03/16/2016   HCT 28.3 (L) 03/16/2016   MCV 82.0 03/16/2016   PLT 257 03/16/2016    Lab Results  Component Value Date   CREATININE 0.61 03/16/2016   BUN 6 03/16/2016   NA 137 03/16/2016   K 3.1 (L) 03/16/2016   CL 105 03/16/2016   CO2 24 03/16/2016    Lab Results  Component Value Date   ALT 11 (L) 03/15/2016   AST 13 (L) 03/15/2016   ALKPHOS 47 03/15/2016   BILITOT 0.5 03/15/2016     Microbiology: Recent Results (from the past 240 hour(s))  Blood Culture (routine x 2)     Status: Abnormal (Preliminary result)   Collection Time: 03/14/16 12:20 PM  Result Value Ref Range Status   Specimen Description BLOOD RIGHT ANTECUBITAL  Final   Special Requests BOTTLES DRAWN AEROBIC AND ANAEROBIC 5ML EACH   Final   Culture  Setup Time   Final    GRAM POSITIVE COCCI IN CHAINS IN PAIRS IN BOTH AEROBIC AND ANAEROBIC BOTTLES CRITICAL RESULT CALLED TO, READ BACK BY AND VERIFIED WITHSherlon Handing, PHARD AT 0509 03/15/16 MKELLY    Culture (A)  Final    STAPHYLOCOCCUS AUREUS GROUP B STREP(S.AGALACTIAE)ISOLATED TESTING AGAINST S. AGALACTIAE NOT ROUTINELY PERFORMED DUE TO PREDICTABILITY OF AMP/PEN/VAN SUSCEPTIBILITY.    Report Status PENDING  Incomplete  Blood Culture ID Panel (Reflexed)     Status: Abnormal   Collection Time: 03/14/16 12:20 PM  Result Value Ref Range Status   Enterococcus species NOT DETECTED NOT DETECTED Final   Listeria monocytogenes NOT DETECTED NOT DETECTED Final   Staphylococcus species DETECTED (A) NOT DETECTED Final    Comment: CRITICAL RESULT CALLED TO, READ BACK BY AND VERIFIED WITH: CARON AMEND, PHARMD @0509  03/15/16 MKELLY    Staphylococcus aureus DETECTED (A) NOT DETECTED Final    Comment: CRITICAL RESULT CALLED TO, READ BACK BY AND VERIFIED WITH: CARON AMEND,PHARMD @0509  03/15/16 MKELLY    Methicillin resistance NOT DETECTED NOT DETECTED Final   Streptococcus species DETECTED (A) NOT DETECTED Final    Comment: CRITICAL RESULT CALLED TO, READ BACK BY AND VERIFIED WITH: CARON AMEND,PHARMD @0509  03/15/16 MKELLY    Streptococcus agalactiae DETECTED (A) NOT DETECTED Final    Comment: CRITICAL RESULT CALLED TO, READ BACK BY AND VERIFIED WITH: CARON AMEND,PHARMD @0509  03/15/16 MKELLY    Streptococcus pneumoniae NOT DETECTED NOT DETECTED Final   Streptococcus pyogenes NOT DETECTED NOT DETECTED Final   Acinetobacter baumannii NOT DETECTED NOT DETECTED Final   Enterobacteriaceae species NOT DETECTED NOT DETECTED Final   Enterobacter cloacae complex NOT DETECTED NOT DETECTED Final   Escherichia coli NOT DETECTED NOT DETECTED Final   Klebsiella oxytoca NOT DETECTED NOT DETECTED Final   Klebsiella pneumoniae NOT DETECTED NOT DETECTED Final   Proteus species NOT DETECTED  NOT DETECTED Final   Serratia marcescens NOT DETECTED NOT DETECTED Final   Haemophilus influenzae NOT DETECTED NOT DETECTED Final   Neisseria meningitidis NOT DETECTED NOT DETECTED Final   Pseudomonas aeruginosa NOT DETECTED NOT DETECTED Final   Candida albicans NOT DETECTED NOT DETECTED Final   Candida glabrata NOT DETECTED NOT DETECTED Final   Candida krusei NOT DETECTED NOT DETECTED Final   Candida parapsilosis NOT DETECTED NOT DETECTED Final   Candida tropicalis NOT DETECTED NOT DETECTED Final  Blood Culture (routine x 2)     Status: None (Preliminary result)   Collection Time: 03/14/16 12:50 PM  Result Value Ref Range Status   Specimen Description BLOOD RIGHT HAND  Final  Special Requests BOTTLES DRAWN AEROBIC AND ANAEROBIC 5ML EACH  Final   Culture NO GROWTH 2 DAYS  Final   Report Status PENDING  Incomplete  Urine culture     Status: Abnormal   Collection Time: 03/14/16  1:14 PM  Result Value Ref Range Status   Specimen Description URINE, RANDOM  Final   Special Requests NONE  Final   Culture MULTIPLE SPECIES PRESENT, SUGGEST RECOLLECTION (A)  Final   Report Status 03/15/2016 FINAL  Final  Culture, blood (routine x 2)     Status: None (Preliminary result)   Collection Time: 03/15/16  3:44 PM  Result Value Ref Range Status   Specimen Description BLOOD LEFT HAND  Final   Special Requests IN PEDIATRIC BOTTLE 3CC  Final   Culture NO GROWTH < 24 HOURS  Final   Report Status PENDING  Incomplete  Culture, blood (routine x 2)     Status: None (Preliminary result)   Collection Time: 03/15/16  3:54 PM  Result Value Ref Range Status   Specimen Description BLOOD LEFT ANTECUBITAL  Final   Special Requests IN PEDIATRIC BOTTLE 2CC  Final   Culture NO GROWTH < 24 HOURS  Final   Report Status PENDING  Incomplete     ASSESSMENT: She had transient MSSA and group B strep bacteremia. Only one of 2 admission blood cultures is positive. Repeat blood cultures are negative. There is no  evidence of endocarditis by exam or TTE. If blood cultures remain negative she can have a PICC placed tomorrow and I would plan on 14 days of total therapy. I think the chance of endocarditis is very low. I do not feel she needs a TEE.  PLAN: 1. Continue ceftriaxone for 11 more days 2. Okay to place PICC tomorrow if blood cultures are negative  Michel Bickers, MD [facility] [place] [phone] pager   [phone] cell 03/16/2016, 4:08 PM

## 2016-03-16 NOTE — Progress Notes (Signed)
PROGRESS NOTE    Jacqueline [name]  [id number] DOB: 27-Sep-1980 DOA: 03/14/2016 PCP: Eloise Levels, NP  Brief Narrative: Gustavia Gleim is a 35 y.o. female with medical history significant of DM, HTN, obesity, depression presents to the ER with the above complaints. Pt reports experiencing a headache, nausea and chills 8/27 evening, subsequently tried to sleep and woke up at midnight with a severe headache, fever, neck pain/chills and vomited x2. Vomitus was non bloody and non bilious. After this had one episode of diarrhea. Came to the ER 8/28 and LP was attempted x2 by EDP and once by IR under fluorocopy all unsuccessful -due to body habitus. Then found to have bacteremia -Strep and Staph, ID consulted, source unclear  Assessment & Plan:   1. Staph and Strep Bacteremia -Jacqueline [name] is a 35 year old female with a history of diabetes mellitus presented with fever, chills, nausea, vomiting, temperature 102.8 -Blood cultures drawn on 03/14/2016 growing methicillin sensitive Staphylococcus aureus and group B streptococcus, repeat blood cultures drawn on 03/15/2016 remained sterile to date. -Jacqueline [name] had been treated with IV Vanc and Ceftriaxone, vancomycin discontinued on 03/15/2016 -Unclear source of infection, had a transthoracic echocardiogram performed on 03/16/2016 that did not reveal vegetations -ID consulted, Dr.Campbell following  2.  Hypokalemia -AM laboratory potassium of 3.1, replaced with Kdur -Repeat labs in a.m.  3. DM -Continue lantus 45 units subcutaneous at bedtime, SSI  4. HTN -Continue losartan 100 mg by mouth daily  DVT prophylaxis:SCDs Code Status:Full Code Family Communication: none at bedside Disposition Plan: Pending further workup and management   Consultants:  ID Dr.Campbell Antimicrobials: Ceftriaxone  Subjective: Jacqueline [name] states feel a little better today denies further episodes of nausea vomiting, tolerating by mouth intake  Objective: Vitals:   03/15/16 1320  03/15/16 2105 03/16/16 0459 03/16/16 1514  BP: (!) 171/92 (!) 144/78 140/78 (!) 157/88  Pulse: (!) 110 (!) 106 94 93  Resp: 15 19 19 19   Temp: 99.2 F (37.3 C) 99.2 F (37.3 C) 98.4 F (36.9 C) 98.4 F (36.9 C)  TempSrc: Oral Oral Oral Oral  SpO2: 99% 100% 98% 99%  Weight:      Height:        Intake/Output Summary (Last 24 hours) at 03/16/16 1609 Last data filed at 03/16/16 0900  Gross per 24 hour  Intake          3375.83 ml  Output              450 ml  Net          2925.83 ml   Filed Weights   03/14/16 1240 03/14/16 1902  Weight: 121.6 kg (268 lb) 129.5 kg (285 lb 6.4 oz)    Examination:  General exam: Appears calm and comfortable, AAOx3, no distress Respiratory system: Clear to auscultation. Respiratory effort normal. Cardiovascular system: S1 & S2 heard, RRR. No JVD, murmurs, rubs, gallops or clicks. No pedal edema. Gastrointestinal system: Abdomen is nondistended, soft and nontender. No organomegaly or masses felt. Normal bowel sounds heard. Central nervous system: Alert and oriented. No focal neurological deficits. Extremities: Symmetric 5 x 5 power. Skin: No rashes, lesions or ulcers Psychiatry: Judgement and insight appear normal. Mood & affect appropriate.     Data Reviewed: I have personally reviewed following labs and imaging studies  CBC:  Recent Labs Lab 03/14/16 1248 03/15/16 0459 03/16/16 0457  WBC 10.8* 6.5 8.4  NEUTROABS 8.9*  --   --   HGB 11.9* 9.5* 9.1*  HCT 35.9* 28.4* 28.3*  MCV  81.8 82.1 82.0  PLT 310 260 [id number]   Basic Metabolic Panel:  Recent Labs Lab 03/14/16 1248 03/15/16 0459 03/16/16 0456  NA 136 135 137  K 3.7 3.3* 3.1*  CL 104 106 105  CO2 22 23 24   GLUCOSE 283* 311* 192*  BUN 12 9 6   CREATININE 0.78 0.66 0.61  CALCIUM 8.9 7.5* 7.5*   GFR: Estimated Creatinine Clearance: 129.8 mL/min (by C-G formula based on SCr of 0.8 mg/dL). Liver Function Tests:  Recent Labs Lab 03/14/16 1248 03/15/16 0459  AST 16 13*  ALT  13* 11*  ALKPHOS 60 47  BILITOT 1.2 0.5  PROT 7.4 5.1*  ALBUMIN 3.4* 2.3*   No results for input(s): LIPASE, AMYLASE in the last 168 hours. No results for input(s): AMMONIA in the last 168 hours. Coagulation Profile: No results for input(s): INR, PROTIME in the last 168 hours. Cardiac Enzymes: No results for input(s): CKTOTAL, CKMB, CKMBINDEX, TROPONINI in the last 168 hours. BNP (last 3 results) No results for input(s): PROBNP in the last 8760 hours. HbA1C: No results for input(s): HGBA1C in the last 72 hours. CBG:  Recent Labs Lab 03/15/16 1156 03/15/16 1646 03/15/16 2108 03/16/16 0757 03/16/16 1508  GLUCAP 310* 152* 182* 133* 132*   Lipid Profile: No results for input(s): CHOL, HDL, LDLCALC, TRIG, CHOLHDL, LDLDIRECT in the last 72 hours. Thyroid Function Tests: No results for input(s): TSH, T4TOTAL, FREET4, T3FREE, THYROIDAB in the last 72 hours. Anemia Panel: No results for input(s): VITAMINB12, FOLATE, FERRITIN, TIBC, IRON, RETICCTPCT in the last 72 hours. Urine analysis:    Component Value Date/Time   COLORURINE AMBER (A) 03/14/2016 1306   APPEARANCEUR CLOUDY (A) 03/14/2016 1306   LABSPEC 1.036 (H) 03/14/2016 1306   PHURINE 5.5 03/14/2016 1306   GLUCOSEU >1000 (A) 03/14/2016 1306   HGBUR MODERATE (A) 03/14/2016 1306   BILIRUBINUR SMALL (A) 03/14/2016 1306   KETONESUR NEGATIVE 03/14/2016 1306   PROTEINUR >300 (A) 03/14/2016 1306   UROBILINOGEN 0.2 08/21/2012 2130   NITRITE NEGATIVE 03/14/2016 1306   LEUKOCYTESUR NEGATIVE 03/14/2016 1306   Sepsis Labs: @LABRCNTIP (procalcitonin:4,lacticidven:4)  ) Recent Results (from the past 240 hour(s))  Blood Culture (routine x 2)     Status: Abnormal (Preliminary result)   Collection Time: 03/14/16 12:20 PM  Result Value Ref Range Status   Specimen Description BLOOD RIGHT ANTECUBITAL  Final   Special Requests BOTTLES DRAWN AEROBIC AND ANAEROBIC 5ML EACH  Final   Culture  Setup Time   Final    GRAM POSITIVE COCCI IN  CHAINS IN PAIRS IN BOTH AEROBIC AND ANAEROBIC BOTTLES CRITICAL RESULT CALLED TO, READ BACK BY AND VERIFIED WITHSherlon Handing, PHARD AT 0509 03/15/16 MKELLY    Culture (A)  Final    STAPHYLOCOCCUS AUREUS GROUP B STREP(S.AGALACTIAE)ISOLATED TESTING AGAINST S. AGALACTIAE NOT ROUTINELY PERFORMED DUE TO PREDICTABILITY OF AMP/PEN/VAN SUSCEPTIBILITY.    Report Status PENDING  Incomplete  Blood Culture ID Panel (Reflexed)     Status: Abnormal   Collection Time: 03/14/16 12:20 PM  Result Value Ref Range Status   Enterococcus species NOT DETECTED NOT DETECTED Final   Listeria monocytogenes NOT DETECTED NOT DETECTED Final   Staphylococcus species DETECTED (A) NOT DETECTED Final    Comment: CRITICAL RESULT CALLED TO, READ BACK BY AND VERIFIED WITH: CARON AMEND, PHARMD @0509  03/15/16 MKELLY    Staphylococcus aureus DETECTED (A) NOT DETECTED Final    Comment: CRITICAL RESULT CALLED TO, READ BACK BY AND VERIFIED WITH: CARON AMEND,PHARMD @0509  03/15/16 MKELLY  Methicillin resistance NOT DETECTED NOT DETECTED Final   Streptococcus species DETECTED (A) NOT DETECTED Final    Comment: CRITICAL RESULT CALLED TO, READ BACK BY AND VERIFIED WITH: CARON AMEND,PHARMD @0509  03/15/16 MKELLY    Streptococcus agalactiae DETECTED (A) NOT DETECTED Final    Comment: CRITICAL RESULT CALLED TO, READ BACK BY AND VERIFIED WITH: CARON AMEND,PHARMD @0509  03/15/16 MKELLY    Streptococcus pneumoniae NOT DETECTED NOT DETECTED Final   Streptococcus pyogenes NOT DETECTED NOT DETECTED Final   Acinetobacter baumannii NOT DETECTED NOT DETECTED Final   Enterobacteriaceae species NOT DETECTED NOT DETECTED Final   Enterobacter cloacae complex NOT DETECTED NOT DETECTED Final   Escherichia coli NOT DETECTED NOT DETECTED Final   Klebsiella oxytoca NOT DETECTED NOT DETECTED Final   Klebsiella pneumoniae NOT DETECTED NOT DETECTED Final   Proteus species NOT DETECTED NOT DETECTED Final   Serratia marcescens NOT DETECTED NOT  DETECTED Final   Haemophilus influenzae NOT DETECTED NOT DETECTED Final   Neisseria meningitidis NOT DETECTED NOT DETECTED Final   Pseudomonas aeruginosa NOT DETECTED NOT DETECTED Final   Candida albicans NOT DETECTED NOT DETECTED Final   Candida glabrata NOT DETECTED NOT DETECTED Final   Candida krusei NOT DETECTED NOT DETECTED Final   Candida parapsilosis NOT DETECTED NOT DETECTED Final   Candida tropicalis NOT DETECTED NOT DETECTED Final  Blood Culture (routine x 2)     Status: None (Preliminary result)   Collection Time: 03/14/16 12:50 PM  Result Value Ref Range Status   Specimen Description BLOOD RIGHT HAND  Final   Special Requests BOTTLES DRAWN AEROBIC AND ANAEROBIC 5ML EACH  Final   Culture NO GROWTH 2 DAYS  Final   Report Status PENDING  Incomplete  Urine culture     Status: Abnormal   Collection Time: 03/14/16  1:14 PM  Result Value Ref Range Status   Specimen Description URINE, RANDOM  Final   Special Requests NONE  Final   Culture MULTIPLE SPECIES PRESENT, SUGGEST RECOLLECTION (A)  Final   Report Status 03/15/2016 FINAL  Final  Culture, blood (routine x 2)     Status: None (Preliminary result)   Collection Time: 03/15/16  3:44 PM  Result Value Ref Range Status   Specimen Description BLOOD LEFT HAND  Final   Special Requests IN PEDIATRIC BOTTLE 3CC  Final   Culture NO GROWTH < 24 HOURS  Final   Report Status PENDING  Incomplete  Culture, blood (routine x 2)     Status: None (Preliminary result)   Collection Time: 03/15/16  3:54 PM  Result Value Ref Range Status   Specimen Description BLOOD LEFT ANTECUBITAL  Final   Special Requests IN PEDIATRIC BOTTLE 2CC  Final   Culture NO GROWTH < 24 HOURS  Final   Report Status PENDING  Incomplete         Radiology Studies: Mr Jeri Cos Wo Contrast  Result Date: 03/15/2016 CLINICAL DATA:  Headache, nausea and chills. Fever and neck pain. Vomiting. Status post 2 lumbar puncture attempts. EXAM: MRI HEAD WITHOUT AND WITH  CONTRAST TECHNIQUE: Multiplanar, multiecho pulse sequences of the brain and surrounding structures were obtained without and with intravenous contrast. CONTRAST:  7mL MULTIHANCE GADOBENATE DIMEGLUMINE 529 MG/ML IV SOLN COMPARISON:  Head CT 03/14/2016 FINDINGS: Brain Parenchyma: No acute infarct or intraparenchymal hemorrhage. No focal parenchymal signal abnormality. No mass lesion or midline shift. The major intracranial flow voids are preserved. The midline structures are normal. Ventricles, Sulci and Extra-axial Spaces: Normal for age. No extra-axial collection.  Paranasal Sinuses and Mastoids: No fluid levels or advanced mucosal thickening. Orbits: Normal. Bones and Soft Tissues: The visualized skull base, calvarium and extracranial soft tissues are normal. IMPRESSION: Normal brain MRI. Electronically Signed   By: Ulyses Jarred M.D.   On: 03/15/2016 00:09   Dg Fluoro Guided Loc Of Needle/cath Tip For Spinal Inject Lt  Result Date: 03/14/2016 CLINICAL DATA:  Headache and neck pain.  Rule out meningitis. EXAM: DIAGNOSTIC LUMBAR PUNCTURE UNDER FLUOROSCOPIC GUIDANCE FLUOROSCOPY TIME:  If the device does not provide the exposure index: Fluoroscopy Time (in minutes and seconds):  3 minutes and 54 seconds Number of Acquired Images:  None PROCEDURE: Informed consent was obtained from the patient prior to the procedure, including potential complications of headache, allergy, and pain. With the patient prone, the lower back was prepped with Betadine. 1% Lidocaine was used for local anesthesia. Lumbar puncture was attempted at the L4-5 level using a 20 gauge needle. Procedure complicated by patient body habitus. Despite multiple attempts and multiple times repositioning of the needle, the thecal sac could not be entered. IMPRESSION: Unsuccessful, attempted lumbar puncture, as detailed above. No acute complication. Electronically Signed   By: Abigail Miyamoto M.D.   On: 03/14/2016 16:50        Scheduled Meds: .  cefTRIAXone (ROCEPHIN)  IV  2 g Intravenous Q24H  . docusate sodium  100 mg Oral BID  . enoxaparin (LOVENOX) injection  40 mg Subcutaneous Q24H  . insulin aspart  0-15 Units Subcutaneous TID WC  . insulin aspart  0-5 Units Subcutaneous QHS  . insulin glargine  45 Units Subcutaneous QHS  . losartan  100 mg Oral Daily  . pantoprazole  40 mg Oral Daily  . polyethylene glycol  17 g Oral Daily  . sertraline  50 mg Oral Daily   Continuous Infusions: . sodium chloride 50 mL/hr at 03/16/16 0747     LOS: 2 days    Time spent: 35 min   Milinda Cave, MD [facility] Pager [phone]  If 7PM-7AM, please contact night-coverage [email or web address] Password [email or web address] 03/16/2016, 4:09 PM

## 2016-03-16 NOTE — Progress Notes (Signed)
  Echocardiogram 2D Echocardiogram with Definity has been performed.  Ashleyann Shoun 03/16/2016, 1:10 PM

## 2016-03-17 DIAGNOSIS — R000 Tachycardia, unspecified: Secondary | ICD-10-CM

## 2016-03-17 DIAGNOSIS — Z6841 Body Mass Index (BMI) 40.0 and over, adult: Secondary | ICD-10-CM

## 2016-03-17 DIAGNOSIS — E669 Obesity, unspecified: Secondary | ICD-10-CM

## 2016-03-17 DIAGNOSIS — B9561 Methicillin susceptible Staphylococcus aureus infection as the cause of diseases classified elsewhere: Secondary | ICD-10-CM

## 2016-03-17 DIAGNOSIS — E118 Type 2 diabetes mellitus with unspecified complications: Secondary | ICD-10-CM

## 2016-03-17 LAB — BASIC METABOLIC PANEL
Anion gap: 5 (ref 5–15)
BUN: 5 mg/dL — AB (ref 6–20)
CALCIUM: 7.9 mg/dL — AB (ref 8.9–10.3)
CO2: 25 mmol/L (ref 22–32)
CREATININE: 0.64 mg/dL (ref 0.44–1.00)
Chloride: 109 mmol/L (ref 101–111)
GFR calc Af Amer: >60 mL/min (ref >60)
GLUCOSE: 228 mg/dL — AB (ref 65–99)
POTASSIUM: 3.8 mmol/L (ref 3.5–5.1)
SODIUM: 139 mmol/L (ref 135–145)

## 2016-03-17 LAB — CBC
HEMATOCRIT: 29 % — AB (ref 36.0–46.0)
Hemoglobin: 9.7 g/dL — ABNORMAL LOW (ref 12.0–15.0)
MCH: 27.4 pg (ref 26.0–34.0)
MCHC: 33.4 g/dL (ref 30.0–36.0)
MCV: 81.9 fL (ref 78.0–100.0)
PLATELETS: 277 10*3/uL (ref 150–400)
RBC: 3.54 MIL/uL — ABNORMAL LOW (ref 3.87–5.11)
RDW: 13.3 % (ref 11.5–15.5)
WBC: 8.1 10*3/uL (ref 4.0–10.5)

## 2016-03-17 LAB — GLUCOSE, CAPILLARY
GLUCOSE-CAPILLARY: 204 mg/dL — AB (ref 65–99)
GLUCOSE-CAPILLARY: 112 mg/dL — AB (ref 65–99)
GLUCOSE-CAPILLARY: 198 mg/dL — AB (ref 65–99)
Glucose-Capillary: 200 mg/dL — ABNORMAL HIGH (ref 65–99)

## 2016-03-17 MED ORDER — SODIUM CHLORIDE 0.9% FLUSH: 10.0000 mL | Freq: Two times a day (BID) | INTRAVENOUS | Status: DC

## 2016-03-17 MED ORDER — POLYETHYLENE GLYCOL 3350 17 G PO PACK: 17.0000 g | PACK | Freq: Every day | ORAL | 0 refills | Status: DC

## 2016-03-17 MED ORDER — DEXTROSE 5 % IV SOLN: 2.0000 g | INTRAVENOUS | 0 refills | Status: AC

## 2016-03-17 MED ORDER — FLEET ENEMA 7-19 GM/118ML RE ENEM
1.0000 | ENEMA | Freq: Once | RECTAL | Status: AC
  Administered 2016-03-17: 1 via RECTAL
  Filled 2016-03-17: qty 1

## 2016-03-17 MED ORDER — SODIUM CHLORIDE 0.9% FLUSH
10.0000 mL | INTRAVENOUS | Status: DC | PRN
  Administered 2016-03-18: 10 mL
  Filled 2016-03-17: qty 40

## 2016-03-17 MED ORDER — ACETAMINOPHEN 325 MG PO TABS: 650.0000 mg | ORAL_TABLET | Freq: Four times a day (QID) | ORAL | 0 refills | Status: DC | PRN

## 2016-03-17 MED ORDER — DOCUSATE SODIUM 100 MG PO CAPS: 100.0000 mg | ORAL_CAPSULE | Freq: Two times a day (BID) | ORAL | 0 refills | Status: DC

## 2016-03-17 MED ORDER — BISACODYL 10 MG RE SUPP
10.0000 mg | Freq: Once | RECTAL | Status: AC
  Administered 2016-03-17: 10 mg via RECTAL
  Filled 2016-03-17: qty 1

## 2016-03-17 NOTE — Discharge Summary (Addendum)
Physician Discharge Summary  Jacqueline [name] [id number] DOB: 08/31/1980 DOA: 03/14/2016  PCP: Eloise Levels, NP  Admit date: 03/14/2016 Discharge date: 03/18/2016  Time spent: 35 minutes  Recommendations for Outpatient Follow-up:  1. She will need 10 more days of Ceftriaxone 2 g IV q daily, anticpated stop date 03/27/2016 2. Prior to discharge she was set up with [facility] for home IV infusion therapy 3. Please remove PICC line after completing IV antibiotic therapy   Discharge Diagnoses:  Active Problems:   Diabetes ([place])   HTN (hypertension)   Morbid obesity ([place])   Tachycardia   Fever   Headache   Staphylococcus aureus bacteremia   Streptococcal bacteremia   Discharge Condition: Stable  Diet recommendation: Carb Modified Diet  Filed Weights   03/14/16 1240 03/14/16 1902  Weight: 121.6 kg (268 lb) 129.5 kg (285 lb 6.4 oz)    History of present illness:   Jacqueline [name] is a 35 y.o. female with medical history significant of DM, HTN, obesity, depression presents to the ER with the above complaints. Pt reports experiencing a headache, nausea and chills last evening, subsequently tried to sleep and woke up at midnight with a severe headache, fever, neck pain/chills and vomited x2. Vomitus was non bloody and non bilious. After this had one episode of diarrhea. Came to the ER this afternoon and LP was attempted x2 by EDP and once by IR under fluorocopy all unsuccessful -due to body habitus. Then started on Empiric Ceftriaxone and TRH consulted for admission  Hospital Course:  Jacqueline [name] is a 35 year old female with a past medical history of diabetes mellitus, admitted to the medicine service on 03/06/2016 presented with complaints of fevers, chills, nausea, vomiting, found to have a temperature 102.8. Blood cultures drawn on 03/06/2016 growing methicillin sensitive Staphylococcus aureus and group B streptococcus. She was started on broad-spectrum IV antibiotic therapy  with IV vancomycin and ceftriaxone. Initially there were concerns that this could reflect meningitis, undergoing several unsuccessful lumbar puncture, 2 in the emergency department and 1 by interventional radiology under fluoroscopy. Infectious disease was consulted who did not feel this represented meningitis. It was unclear where her source of infection was as labs and imaging studies did not reveal a clear source of infection. Overall however she seemed to show clinical improvement with ceftriaxone. IV vancomycin has been discontinued. Repeat blood cultures drawn on 03/15/2016 remained sterile to date. Dr. Megan Salon recommended transthoracic echocardiogram that did not show evidence of vegetations. On 03/17/2016 she had a PICC line placed and discharged on IV ceftriaxone 2 g every 24 hours as recommended by infectious disease. Plan to treat for a total of 14 days, therefore was discharged on 10 more days which anticipated stop date on 03/27/2016. Prior to discharge she was set up with home health services for home infusion therapy.  Addendum Discharge had a plan for 03/17/2016 however PICC line could not be placed until late in the evening. She was kept overnight and discharged this morning. No events occurred overnight. Prior to discharge she was given dose of ceftriaxone  Procedures:  Lumbar puncture attempted x 3  Consultations:  Infectious disease  Discharge Exam: Vitals:   03/17/16 0503 03/17/16 1403  BP: 129/72 (!) 173/87  Pulse: 94 94  Resp: 18 17  Temp: 98 F (36.7 C) 98.7 F (37.1 C)    General: Nontoxic-appearing Cardiovascular: Regular rate rhythm normal S1-S2 no murmurs rubs or gallops Respiratory: Normal respiratory effort, lungs are clear to auscultation bilaterally Abdomen: Soft nontender nondistended  Extremities: No edema  Discharge Instructions   Discharge Instructions    Call MD for:    Complete by:  As directed   Call MD for:  difficulty breathing, headache or  visual disturbances    Complete by:  As directed   Call MD for:  extreme fatigue    Complete by:  As directed   Call MD for:  hives    Complete by:  As directed   Call MD for:  persistant dizziness or light-headedness    Complete by:  As directed   Call MD for:  persistant nausea and vomiting    Complete by:  As directed   Call MD for:  redness, tenderness, or signs of infection (pain, swelling, redness, odor or green/yellow discharge around incision site)    Complete by:  As directed   Call MD for:  severe uncontrolled pain    Complete by:  As directed   Call MD for:  temperature >100.4    Complete by:  As directed   Diet - low sodium heart healthy    Complete by:  As directed   Increase activity slowly    Complete by:  As directed     Current Discharge Medication List    START taking these medications   Details  acetaminophen (TYLENOL) 325 MG tablet Take 2 tablets (650 mg total) by mouth every 6 (six) hours as needed for mild pain (or Fever >/= 101). Qty: 30 tablet, Refills: 0    cefTRIAXone 2 g in dextrose 5 % 50 mL Inject 2 g into the vein daily. Qty: 1 Bottle, Refills: 0    docusate sodium (COLACE) 100 MG capsule Take 1 capsule (100 mg total) by mouth 2 (two) times daily. Qty: 10 capsule, Refills: 0    polyethylene glycol (MIRALAX / GLYCOLAX) packet Take 17 g by mouth daily. Qty: 14 each, Refills: 0      CONTINUE these medications which have NOT CHANGED   Details  ALPRAZolam (XANAX) 0.5 MG tablet Take 0.5 mg by mouth daily as needed for anxiety.    glipiZIDE (GLUCOTROL) 10 MG tablet Take 10 mg by mouth daily.    Insulin Glargine (BASAGLAR KWIKPEN) 100 UNIT/ML SOPN Inject 42 Units into the skin at bedtime.    Iron-FA-B Cmp-C-Biot-Probiotic (FUSION PLUS) CAPS Take 1 capsule by mouth daily.    losartan (COZAAR) 100 MG tablet Take 1 tablet (100 mg total) by mouth daily. Qty: 30 tablet, Refills: 3    metFORMIN (GLUCOPHAGE) 1000 MG tablet Take 1,000-1,500 mg by mouth daily  with breakfast. Takes 1500 mg in the mornings and 1000 mg in the evenings.    pantoprazole (PROTONIX) 40 MG tablet Take 1 tablet (40 mg total) by mouth daily. Qty: 30 tablet, Refills: 1    sertraline (ZOLOFT) 50 MG tablet Take 50 mg by mouth daily.      STOP taking these medications     insulin glargine (LANTUS) 100 UNIT/ML injection        No Known Allergies Follow-up Information    [facility] .   Why:  For home health RN and IV Abx Contact information: [place] [address] [phone]        Eloise Levels, NP Follow up in 1 week(s).   Specialty:  Nurse Practitioner Contact information: [phone] [place] [address] [phone]        Michel Bickers, MD Follow up in 1 week(s).   Specialty:  Infectious Diseases  Contact information: [address] [place] [address] [phone]        Michel Bickers, MD .   Specialty:  Infectious Diseases Contact information: [place] [address] [phone]            The results of significant diagnostics from this hospitalization (including imaging, microbiology, ancillary and laboratory) are listed below for reference.    Significant Diagnostic Studies: Dg Chest 2 View  Result Date: 03/14/2016 CLINICAL DATA:  Weakness, vomiting and fever beginning yesterday. EXAM: CHEST  2 VIEW COMPARISON:  PA and lateral chest 01/06/2016 and 07/05/2015. FINDINGS: The lungs are clear. Heart size is normal. No pneumothorax or pleural effusion. No focal bony abnormality. IMPRESSION: No acute disease. Electronically Signed   By: Inge Rise M.D.   On: 03/14/2016 13:33   Ct Head Wo Contrast  Result Date: 03/14/2016 CLINICAL DATA:  Headache, fever, and vomiting. EXAM: CT HEAD WITHOUT CONTRAST TECHNIQUE: Contiguous axial images were obtained from the base of the skull through the vertex without intravenous contrast. COMPARISON:   CT scan dated 12/17/2009 FINDINGS: Brain: No mass lesion. No midline shift. No acute hemorrhage or hematoma. No extra-axial fluid collections. No evidence of acute infarction. Brain parenchyma is normal. Vascular: Normal. Skull: Normal. Sinuses/Orbits: Normal. IMPRESSION: Normal exam. Electronically Signed   By: Lorriane Shire M.D.   On: 03/14/2016 13:52   Mr Jeri Cos [id number] Contrast  Result Date: 03/15/2016 CLINICAL DATA:  Headache, nausea and chills. Fever and neck pain. Vomiting. Status post 2 lumbar puncture attempts. EXAM: MRI HEAD WITHOUT AND WITH CONTRAST TECHNIQUE: Multiplanar, multiecho pulse sequences of the brain and surrounding structures were obtained without and with intravenous contrast. CONTRAST:  3mL MULTIHANCE GADOBENATE DIMEGLUMINE 529 MG/ML IV SOLN COMPARISON:  Head CT 03/14/2016 FINDINGS: Brain Parenchyma: No acute infarct or intraparenchymal hemorrhage. No focal parenchymal signal abnormality. No mass lesion or midline shift. The major intracranial flow voids are preserved. The midline structures are normal. Ventricles, Sulci and Extra-axial Spaces: Normal for age. No extra-axial collection. Paranasal Sinuses and Mastoids: No fluid levels or advanced mucosal thickening. Orbits: Normal. Bones and Soft Tissues: The visualized skull base, calvarium and extracranial soft tissues are normal. IMPRESSION: Normal brain MRI. Electronically Signed   By: Ulyses Jarred M.D.   On: 03/15/2016 00:09   Dg Fluoro Guided Loc Of Needle/cath Tip For Spinal Inject Lt  Result Date: 03/14/2016 CLINICAL DATA:  Headache and neck pain.  Rule out meningitis. EXAM: DIAGNOSTIC LUMBAR PUNCTURE UNDER FLUOROSCOPIC GUIDANCE FLUOROSCOPY TIME:  If the device does not provide the exposure index: Fluoroscopy Time (in minutes and seconds):  3 minutes and 54 seconds Number of Acquired Images:  None PROCEDURE: Informed consent was obtained from the patient prior to the procedure, including potential complications of headache,  allergy, and pain. With the patient prone, the lower back was prepped with Betadine. 1% Lidocaine was used for local anesthesia. Lumbar puncture was attempted at the L4-5 level using a 20 gauge needle. Procedure complicated by patient body habitus. Despite multiple attempts and multiple times repositioning of the needle, the thecal sac could not be entered. IMPRESSION: Unsuccessful, attempted lumbar puncture, as detailed above. No acute complication. Electronically Signed   By: Abigail Miyamoto M.D.   On: 03/14/2016 16:50    Microbiology: Recent Results (from the past 240 hour(s))  Blood Culture (routine x 2)     Status: Abnormal (Preliminary result)   Collection Time: 03/14/16 12:20 PM  Result Value Ref Range  Status   Specimen Description BLOOD RIGHT ANTECUBITAL  Final   Special Requests BOTTLES DRAWN AEROBIC AND ANAEROBIC 5ML EACH  Final   Culture  Setup Time   Final    GRAM POSITIVE COCCI IN CHAINS IN PAIRS IN BOTH AEROBIC AND ANAEROBIC BOTTLES CRITICAL RESULT CALLED TO, READ BACK BY AND VERIFIED WITH: Sherlon Handing, PHARD AT 0509 03/15/16 MKELLY    Culture (A)  Final    STAPHYLOCOCCUS AUREUS GROUP B STREP(S.AGALACTIAE)ISOLATED SUSCEPTIBILITIES TO FOLLOW    Report Status PENDING  Incomplete   Organism ID, Bacteria STAPHYLOCOCCUS AUREUS  Final      Susceptibility   Staphylococcus aureus - MIC*    CIPROFLOXACIN <=0.5 SENSITIVE Sensitive     ERYTHROMYCIN 0.5 SENSITIVE Sensitive     GENTAMICIN <=0.5 SENSITIVE Sensitive     OXACILLIN 0.5 SENSITIVE Sensitive     TETRACYCLINE >=16 RESISTANT Resistant     VANCOMYCIN 1 SENSITIVE Sensitive     TRIMETH/SULFA <=10 SENSITIVE Sensitive     CLINDAMYCIN <=0.25 SENSITIVE Sensitive     RIFAMPIN <=0.5 SENSITIVE Sensitive     Inducible Clindamycin NEGATIVE Sensitive     * STAPHYLOCOCCUS AUREUS  Blood Culture ID Panel (Reflexed)     Status: Abnormal   Collection Time: 03/14/16 12:20 PM  Result Value Ref Range Status   Enterococcus species NOT DETECTED  NOT DETECTED Final   Listeria monocytogenes NOT DETECTED NOT DETECTED Final   Staphylococcus species DETECTED (A) NOT DETECTED Final    Comment: CRITICAL RESULT CALLED TO, READ BACK BY AND VERIFIED WITH: CARON AMEND, PHARMD @0509  03/15/16 MKELLY    Staphylococcus aureus DETECTED (A) NOT DETECTED Final    Comment: CRITICAL RESULT CALLED TO, READ BACK BY AND VERIFIED WITH: CARON AMEND,PHARMD @0509  03/15/16 MKELLY    Methicillin resistance NOT DETECTED NOT DETECTED Final   Streptococcus species DETECTED (A) NOT DETECTED Final    Comment: CRITICAL RESULT CALLED TO, READ BACK BY AND VERIFIED WITH: CARON AMEND,PHARMD @0509  03/15/16 MKELLY    Streptococcus agalactiae DETECTED (A) NOT DETECTED Final    Comment: CRITICAL RESULT CALLED TO, READ BACK BY AND VERIFIED WITH: CARON AMEND,PHARMD @0509  03/15/16 MKELLY    Streptococcus pneumoniae NOT DETECTED NOT DETECTED Final   Streptococcus pyogenes NOT DETECTED NOT DETECTED Final   Acinetobacter baumannii NOT DETECTED NOT DETECTED Final   Enterobacteriaceae species NOT DETECTED NOT DETECTED Final   Enterobacter cloacae complex NOT DETECTED NOT DETECTED Final   Escherichia coli NOT DETECTED NOT DETECTED Final   Klebsiella oxytoca NOT DETECTED NOT DETECTED Final   Klebsiella pneumoniae NOT DETECTED NOT DETECTED Final   Proteus species NOT DETECTED NOT DETECTED Final   Serratia marcescens NOT DETECTED NOT DETECTED Final   Haemophilus influenzae NOT DETECTED NOT DETECTED Final   Neisseria meningitidis NOT DETECTED NOT DETECTED Final   Pseudomonas aeruginosa NOT DETECTED NOT DETECTED Final   Candida albicans NOT DETECTED NOT DETECTED Final   Candida glabrata NOT DETECTED NOT DETECTED Final   Candida krusei NOT DETECTED NOT DETECTED Final   Candida parapsilosis NOT DETECTED NOT DETECTED Final   Candida tropicalis NOT DETECTED NOT DETECTED Final  Blood Culture (routine x 2)     Status: None (Preliminary result)   Collection Time: 03/14/16 12:50 PM   Result Value Ref Range Status   Specimen Description BLOOD RIGHT HAND  Final   Special Requests BOTTLES DRAWN AEROBIC AND ANAEROBIC 5ML EACH  Final   Culture NO GROWTH 3 DAYS  Final   Report Status PENDING  Incomplete  Urine culture  Status: Abnormal   Collection Time: 03/14/16  1:14 PM  Result Value Ref Range Status   Specimen Description URINE, RANDOM  Final   Special Requests NONE  Final   Culture MULTIPLE SPECIES PRESENT, SUGGEST RECOLLECTION (A)  Final   Report Status 03/15/2016 FINAL  Final  Culture, blood (routine x 2)     Status: None (Preliminary result)   Collection Time: 03/15/16  3:44 PM  Result Value Ref Range Status   Specimen Description BLOOD LEFT HAND  Final   Special Requests IN PEDIATRIC BOTTLE 3CC  Final   Culture NO GROWTH 2 DAYS  Final   Report Status PENDING  Incomplete  Culture, blood (routine x 2)     Status: None (Preliminary result)   Collection Time: 03/15/16  3:54 PM  Result Value Ref Range Status   Specimen Description BLOOD LEFT ANTECUBITAL  Final   Special Requests IN PEDIATRIC BOTTLE 2CC  Final   Culture NO GROWTH 2 DAYS  Final   Report Status PENDING  Incomplete     Labs: Basic Metabolic Panel:  Recent Labs Lab 03/14/16 1248 03/15/16 0459 03/16/16 0456 03/17/16 0257  NA 136 135 137 139  K 3.7 3.3* 3.1* 3.8  CL 104 106 105 109  CO2 22 23 24 25   GLUCOSE 283* 311* 192* 228*  BUN 12 9 6  5*  CREATININE 0.78 0.66 0.61 0.64  CALCIUM 8.9 7.5* 7.5* 7.9*   Liver Function Tests:  Recent Labs Lab 03/14/16 1248 03/15/16 0459  AST 16 13*  ALT 13* 11*  ALKPHOS 60 47  BILITOT 1.2 0.5  PROT 7.4 5.1*  ALBUMIN 3.4* 2.3*   No results for input(s): LIPASE, AMYLASE in the last 168 hours. No results for input(s): AMMONIA in the last 168 hours. CBC:  Recent Labs Lab 03/14/16 1248 03/15/16 0459 03/16/16 0457 03/17/16 0257  WBC 10.8* 6.5 8.4 8.1  NEUTROABS 8.9*  --   --   --   HGB 11.9* 9.5* 9.1* 9.7*  HCT 35.9* 28.4* 28.3* 29.0*   MCV 81.8 82.1 82.0 81.9  PLT 310 260 257 277   Cardiac Enzymes: No results for input(s): CKTOTAL, CKMB, CKMBINDEX, TROPONINI in the last 168 hours. BNP: BNP (last 3 results)  Recent Labs  12/22/15 1125  BNP 31.1    ProBNP (last 3 results) No results for input(s): PROBNP in the last 8760 hours.  CBG:  Recent Labs Lab 03/16/16 1508 03/16/16 1708 03/16/16 2104 03/17/16 0745 03/17/16 1150  GLUCAP 132* 138* 171* 204* 112*       Signed:  Kelvin Cellar MD.  [facility] 03/17/2016, 2:51 PM

## 2016-03-17 NOTE — Progress Notes (Signed)
PROGRESS NOTE    Jacqueline [name]  [id number] DOB: [birth year] DOA: 03/14/2016 PCP: Eloise Levels, NP  Brief Narrative: Jacqueline [name] is a 35 y.o. female with medical history significant of DM, HTN, obesity, depression presents to the ER with the above complaints. Pt reports experiencing a headache, nausea and chills 8/27 evening, subsequently tried to sleep and woke up at midnight with a severe headache, fever, neck pain/chills and vomited x2. Vomitus was non bloody and non bilious. After this had one episode of diarrhea. Came to the ER 8/28 and LP was attempted x2 by EDP and once by IR under fluorocopy all unsuccessful -due to body habitus. Then found to have bacteremia -Strep and Staph, ID consulted, source unclear  Assessment & Plan:   1. Staph and Strep Bacteremia -[name] is a 35 year old female with a history of diabetes mellitus presented with fever, chills, nausea, vomiting, temperature 102.8 -Blood cultures drawn on 03/14/2016 growing methicillin sensitive Staphylococcus aureus and group B streptococcus, repeat blood cultures drawn on 03/15/2016 remained sterile to date. -She had been treated with IV Vanc and Ceftriaxone, vancomycin discontinued on 03/15/2016 -Unclear source of infection, had a transthoracic echocardiogram performed on 03/16/2016 that did not reveal vegetations -ID consulted, Dr.Campbell following -Awaiting PICC line placement prior to discharge. She will be discharged on ceftriaxone 2 g IV every 24 hours for 10 days.  2.  Hypokalemia -AM laboratory potassium of 3.1, replaced with Kdur -Repeat labs in a.m.  3. DM -Continue lantus 45 units subcutaneous at bedtime, SSI  4. HTN -Continue losartan 100 mg by mouth daily  DVT prophylaxis:SCDs Code Status:Full Code Family Communication: none at bedside Disposition Plan: Pending further workup and management   Consultants:  ID Dr.Campbell Antimicrobials: Ceftriaxone  Subjective: Reported feeling much better  today although still has not had a bowel movement.  Objective: Vitals:   03/16/16 1514 03/16/16 2106 03/17/16 0503 03/17/16 1403  BP: (!) 157/88 (!) 146/66 129/72 (!) 173/87  Pulse: 93 91 94 94  Resp: 19 18 18 17   Temp: 98.4 F (36.9 C) 98.8 F (37.1 C) 98 F (36.7 C) 98.7 F (37.1 C)  TempSrc: Oral Oral Oral Oral  SpO2: 99% 99% 96% 98%  Weight:      Height:        Intake/Output Summary (Last 24 hours) at 03/17/16 1656 Last data filed at 03/17/16 1600  Gross per 24 hour  Intake              700 ml  Output                0 ml  Net              700 ml   Filed Weights   03/14/16 1240 03/14/16 1902  Weight: 121.6 kg (268 lb) 129.5 kg (285 lb 6.4 oz)    Examination:  General exam: Appears calm and comfortable, AAOx3, no distress Respiratory system: Clear to auscultation. Respiratory effort normal. Cardiovascular system: S1 & S2 heard, RRR. No JVD, murmurs, rubs, gallops or clicks. No pedal edema. Gastrointestinal system: Abdomen is nondistended, soft and nontender. No organomegaly or masses felt. Normal bowel sounds heard. Central nervous system: Alert and oriented. No focal neurological deficits. Extremities: Symmetric 5 x 5 power. Skin: No rashes, lesions or ulcers Psychiatry: Judgement and insight appear normal. Mood & affect appropriate.     Data Reviewed: I have personally reviewed following labs and imaging studies  CBC:  Recent Labs Lab 03/14/16 1248 03/15/16 0459 03/16/16 0457  03/17/16 0257  WBC 10.8* 6.5 8.4 8.1  NEUTROABS 8.9*  --   --   --   HGB 11.9* 9.5* 9.1* 9.7*  HCT 35.9* 28.4* 28.3* 29.0*  MCV 81.8 82.1 82.0 81.9  PLT 310 260 257 [id number]   Basic Metabolic Panel:  Recent Labs Lab 03/14/16 1248 03/15/16 0459 03/16/16 0456 03/17/16 0257  NA 136 135 137 139  K 3.7 3.3* 3.1* 3.8  CL 104 106 105 109  CO2 22 23 24 25   GLUCOSE 283* 311* 192* 228*  BUN 12 9 6  5*  CREATININE 0.78 0.66 0.61 0.64  CALCIUM 8.9 7.5* 7.5* 7.9*   GFR: Estimated  Creatinine Clearance: 129.8 mL/min (by C-G formula based on SCr of 0.8 mg/dL). Liver Function Tests:  Recent Labs Lab 03/14/16 1248 03/15/16 0459  AST 16 13*  ALT 13* 11*  ALKPHOS 60 47  BILITOT 1.2 0.5  PROT 7.4 5.1*  ALBUMIN 3.4* 2.3*   No results for input(s): LIPASE, AMYLASE in the last 168 hours. No results for input(s): AMMONIA in the last 168 hours. Coagulation Profile: No results for input(s): INR, PROTIME in the last 168 hours. Cardiac Enzymes: No results for input(s): CKTOTAL, CKMB, CKMBINDEX, TROPONINI in the last 168 hours. BNP (last 3 results) No results for input(s): PROBNP in the last 8760 hours. HbA1C: No results for input(s): HGBA1C in the last 72 hours. CBG:  Recent Labs Lab 03/16/16 1508 03/16/16 1708 03/16/16 2104 03/17/16 0745 03/17/16 1150  GLUCAP 132* 138* 171* 204* 112*   Lipid Profile: No results for input(s): CHOL, HDL, LDLCALC, TRIG, CHOLHDL, LDLDIRECT in the last 72 hours. Thyroid Function Tests: No results for input(s): TSH, T4TOTAL, FREET4, T3FREE, THYROIDAB in the last 72 hours. Anemia Panel: No results for input(s): VITAMINB12, FOLATE, FERRITIN, TIBC, IRON, RETICCTPCT in the last 72 hours. Urine analysis:    Component Value Date/Time   COLORURINE AMBER (A) 03/14/2016 1306   APPEARANCEUR CLOUDY (A) 03/14/2016 1306   LABSPEC 1.036 (H) 03/14/2016 1306   PHURINE 5.5 03/14/2016 1306   GLUCOSEU >1000 (A) 03/14/2016 1306   HGBUR MODERATE (A) 03/14/2016 1306   BILIRUBINUR SMALL (A) 03/14/2016 1306   KETONESUR NEGATIVE 03/14/2016 1306   PROTEINUR >300 (A) 03/14/2016 1306   UROBILINOGEN 0.2 08/21/2012 2130   NITRITE NEGATIVE 03/14/2016 1306   LEUKOCYTESUR NEGATIVE 03/14/2016 1306   Sepsis Labs: @LABRCNTIP (procalcitonin:4,lacticidven:4)  ) Recent Results (from the past 240 hour(s))  Blood Culture (routine x 2)     Status: Abnormal (Preliminary result)   Collection Time: 03/14/16 12:20 PM  Result Value Ref Range Status   Specimen  Description BLOOD RIGHT ANTECUBITAL  Final   Special Requests BOTTLES DRAWN AEROBIC AND ANAEROBIC 5ML EACH  Final   Culture  Setup Time   Final    GRAM POSITIVE COCCI IN CHAINS IN PAIRS IN BOTH AEROBIC AND ANAEROBIC BOTTLES CRITICAL RESULT CALLED TO, READ BACK BY AND VERIFIED WITHSherlon Handing, PHARD AT [id number] 03/15/16 MKELLY    Culture (A)  Final    STAPHYLOCOCCUS AUREUS GROUP B STREP(S.AGALACTIAE)ISOLATED SUSCEPTIBILITIES TO FOLLOW    Report Status PENDING  Incomplete   Organism ID, Bacteria STAPHYLOCOCCUS AUREUS  Final      Susceptibility   Staphylococcus aureus - MIC*    CIPROFLOXACIN <=0.5 SENSITIVE Sensitive     ERYTHROMYCIN 0.5 SENSITIVE Sensitive     GENTAMICIN <=0.5 SENSITIVE Sensitive     OXACILLIN 0.5 SENSITIVE Sensitive     TETRACYCLINE >=16 RESISTANT Resistant     VANCOMYCIN 1 SENSITIVE Sensitive  TRIMETH/SULFA <=10 SENSITIVE Sensitive     CLINDAMYCIN <=0.25 SENSITIVE Sensitive     RIFAMPIN <=0.5 SENSITIVE Sensitive     Inducible Clindamycin NEGATIVE Sensitive     * STAPHYLOCOCCUS AUREUS  Blood Culture ID Panel (Reflexed)     Status: Abnormal   Collection Time: 03/14/16 12:20 PM  Result Value Ref Range Status   Enterococcus species NOT DETECTED NOT DETECTED Final   Listeria monocytogenes NOT DETECTED NOT DETECTED Final   Staphylococcus species DETECTED (A) NOT DETECTED Final    Comment: CRITICAL RESULT CALLED TO, READ BACK BY AND VERIFIED WITH: CARON AMEND, PHARMD @0509  03/15/16 MKELLY    Staphylococcus aureus DETECTED (A) NOT DETECTED Final    Comment: CRITICAL RESULT CALLED TO, READ BACK BY AND VERIFIED WITH: CARON AMEND,PHARMD @0509  03/15/16 MKELLY    Methicillin resistance NOT DETECTED NOT DETECTED Final   Streptococcus species DETECTED (A) NOT DETECTED Final    Comment: CRITICAL RESULT CALLED TO, READ BACK BY AND VERIFIED WITH: CARON AMEND,PHARMD @0509  03/15/16 MKELLY    Streptococcus agalactiae DETECTED (A) NOT DETECTED Final    Comment: CRITICAL  RESULT CALLED TO, READ BACK BY AND VERIFIED WITH: CARON AMEND,PHARMD @0509  03/15/16 MKELLY    Streptococcus pneumoniae NOT DETECTED NOT DETECTED Final   Streptococcus pyogenes NOT DETECTED NOT DETECTED Final   Acinetobacter baumannii NOT DETECTED NOT DETECTED Final   Enterobacteriaceae species NOT DETECTED NOT DETECTED Final   Enterobacter cloacae complex NOT DETECTED NOT DETECTED Final   Escherichia coli NOT DETECTED NOT DETECTED Final   Klebsiella oxytoca NOT DETECTED NOT DETECTED Final   Klebsiella pneumoniae NOT DETECTED NOT DETECTED Final   Proteus species NOT DETECTED NOT DETECTED Final   Serratia marcescens NOT DETECTED NOT DETECTED Final   Haemophilus influenzae NOT DETECTED NOT DETECTED Final   Neisseria meningitidis NOT DETECTED NOT DETECTED Final   Pseudomonas aeruginosa NOT DETECTED NOT DETECTED Final   Candida albicans NOT DETECTED NOT DETECTED Final   Candida glabrata NOT DETECTED NOT DETECTED Final   Candida krusei NOT DETECTED NOT DETECTED Final   Candida parapsilosis NOT DETECTED NOT DETECTED Final   Candida tropicalis NOT DETECTED NOT DETECTED Final  Blood Culture (routine x 2)     Status: None (Preliminary result)   Collection Time: 03/14/16 12:50 PM  Result Value Ref Range Status   Specimen Description BLOOD RIGHT HAND  Final   Special Requests BOTTLES DRAWN AEROBIC AND ANAEROBIC 5ML EACH  Final   Culture NO GROWTH 3 DAYS  Final   Report Status PENDING  Incomplete  Urine culture     Status: Abnormal   Collection Time: 03/14/16  1:14 PM  Result Value Ref Range Status   Specimen Description URINE, RANDOM  Final   Special Requests NONE  Final   Culture MULTIPLE SPECIES PRESENT, SUGGEST RECOLLECTION (A)  Final   Report Status 03/15/2016 FINAL  Final  Culture, blood (routine x 2)     Status: None (Preliminary result)   Collection Time: 03/15/16  3:44 PM  Result Value Ref Range Status   Specimen Description BLOOD LEFT HAND  Final   Special Requests IN PEDIATRIC  BOTTLE 3CC  Final   Culture NO GROWTH 2 DAYS  Final   Report Status PENDING  Incomplete  Culture, blood (routine x 2)     Status: None (Preliminary result)   Collection Time: 03/15/16  3:54 PM  Result Value Ref Range Status   Specimen Description BLOOD LEFT ANTECUBITAL  Final   Special Requests IN PEDIATRIC BOTTLE [facility]  Final   Culture NO GROWTH 2 DAYS  Final   Report Status PENDING  Incomplete         Radiology Studies: No results found.      Scheduled Meds: . cefTRIAXone (ROCEPHIN)  IV  2 g Intravenous Q24H  . docusate sodium  100 mg Oral BID  . enoxaparin (LOVENOX) injection  40 mg Subcutaneous Q24H  . insulin aspart  0-15 Units Subcutaneous TID WC  . insulin aspart  0-5 Units Subcutaneous QHS  . insulin glargine  45 Units Subcutaneous QHS  . losartan  100 mg Oral Daily  . pantoprazole  40 mg Oral Daily  . polyethylene glycol  17 g Oral Daily  . sertraline  50 mg Oral Daily  . sodium phosphate  1 enema Rectal Once   Continuous Infusions:     LOS: 3 days    Time spent: 15 min   Milinda Cave, MD [facility] Pager [phone]  If 7PM-7AM, please contact night-coverage [email or web address] Password [facility] 03/17/2016, 4:56 PM

## 2016-03-17 NOTE — Care Management Note (Signed)
Case Management Note  Patient Details  Name: Jacqueline [name] MRN: [id number] Date of Birth: 1981/01/20  Subjective/Objective:                   Independent patient form home with family, insured, follows with PCP. Admitted for R/O Meningitis, LP unsuccessful X3, on Antivirals and Antibiotics. + Bld Cx.   Action/Plan:  Patient referred to [facility] for [place] IV Abx, and [place] RN after offered choice.  Expected Discharge Date:                  Expected Discharge Plan:  [place]  In-House Referral:  NA  Discharge planning Services  CM Consult  Post Acute Care Choice:  Home Health, Durable Medical Equipment Choice offered to:  Patient  DME Arranged:  IV pump/equipment DME Agency:  [place]:  RN [facility] Agency:  [place]  Status of Service:  Completed, signed off  If discussed at [place] of Stay Meetings, dates discussed:    Additional Comments:  Carles Collet, RN 03/17/2016, 11:15 AM

## 2016-03-17 NOTE — Progress Notes (Signed)
Patient ID: Jacqueline [name], female   DOB: [birth year], 35 y.o.   MRN: [id number]          [facility] for Infectious Disease    Date of Admission:  03/14/2016   Total days of antibiotics 4          Active Problems:   Staphylococcus aureus bacteremia   Streptococcal bacteremia   Tachycardia   Fever   Headache   Diabetes ([place])   HTN (hypertension)   Morbid obesity ([place])   . cefTRIAXone (ROCEPHIN)  IV  2 g Intravenous Q24H  . docusate sodium  100 mg Oral BID  . enoxaparin (LOVENOX) injection  40 mg Subcutaneous Q24H  . insulin aspart  0-15 Units Subcutaneous TID WC  . insulin aspart  0-5 Units Subcutaneous QHS  . insulin glargine  45 Units Subcutaneous QHS  . losartan  100 mg Oral Daily  . pantoprazole  40 mg Oral Daily  . polyethylene glycol  17 g Oral Daily  . sertraline  50 mg Oral Daily    SUBJECTIVE: She is feeling much better.  Review of Systems: Review of Systems  Constitutional: Positive for malaise/fatigue. Negative for chills, diaphoresis, fever and weight loss.  HENT: Negative for sore throat.   Respiratory: Negative for cough, sputum production and shortness of breath.   Cardiovascular: Negative for chest pain.  Gastrointestinal: Negative for abdominal pain, diarrhea, nausea and vomiting.  Genitourinary: Negative for dysuria.  Musculoskeletal: Negative for joint pain and myalgias.  Skin: Negative for rash.  Neurological: Negative for headaches.    Past Medical History:  Diagnosis Date  . Anemia   . Anxiety   . Chronic bronchitis ([place])   . Hypertension   . Morbid obesity ([place])   . Type II diabetes mellitus ([place])     Social History  Substance Use Topics  . Smoking status: Never Smoker  . Smokeless tobacco: Never Used  . Alcohol use No    Family History  Problem Relation Age of Onset  . Diabetes Mother   . Hypertension Mother   . Thyroid disease Mother   . Kidney disease Maternal Grandmother   . Diabetes Maternal Grandmother   . Heart  attack     No Known Allergies  OBJECTIVE: Vitals:   03/16/16 0459 03/16/16 1514 03/16/16 2106 03/17/16 0503  BP: 140/78 (!) 157/88 (!) 146/66 129/72  Pulse: 94 93 91 94  Resp: 19 19 18 18   Temp: 98.4 F (36.9 C) 98.4 F (36.9 C) 98.8 F (37.1 C) 98 F (36.7 C)  TempSrc: Oral Oral Oral Oral  SpO2: 98% 99% 99% 96%  Weight:      Height:       Body mass index is 48.99 kg/m.  Physical Exam  Constitutional: She is oriented to person, place, and time.  She is smiling and looks like she is feeling much better.  Neck: Neck supple.  Cardiovascular: Normal rate and regular rhythm.   No murmur heard. Pulmonary/Chest: Effort normal and breath sounds normal.  Abdominal: Soft. There is no tenderness.  Musculoskeletal: Normal range of motion. She exhibits no edema or tenderness.  Neurological: She is alert and oriented to person, place, and time.  Skin: No rash noted.  Psychiatric: Mood and affect normal.    Lab Results Lab Results  Component Value Date   WBC 8.1 03/17/2016   HGB 9.7 (L) 03/17/2016   HCT 29.0 (L) 03/17/2016   MCV 81.9 03/17/2016   PLT 277 03/17/2016    Lab Results  Component Value Date   CREATININE 0.64 03/17/2016   BUN 5 (L) 03/17/2016   NA 139 03/17/2016   K 3.8 03/17/2016   CL 109 03/17/2016   CO2 25 03/17/2016    Lab Results  Component Value Date   ALT 11 (L) 03/15/2016   AST 13 (L) 03/15/2016   ALKPHOS 47 03/15/2016   BILITOT 0.5 03/15/2016     Microbiology: Recent Results (from the past 240 hour(s))  Blood Culture (routine x 2)     Status: Abnormal (Preliminary result)   Collection Time: 03/14/16 12:20 PM  Result Value Ref Range Status   Specimen Description BLOOD RIGHT ANTECUBITAL  Final   Special Requests BOTTLES DRAWN AEROBIC AND ANAEROBIC 5ML EACH  Final   Culture  Setup Time   Final    GRAM POSITIVE COCCI IN CHAINS IN PAIRS IN BOTH AEROBIC AND ANAEROBIC BOTTLES CRITICAL RESULT CALLED TO, READ BACK BY AND VERIFIED WITH: Sherlon Handing,  PHARD AT 0509 03/15/16 MKELLY    Culture (A)  Final    STAPHYLOCOCCUS AUREUS GROUP B STREP(S.AGALACTIAE)ISOLATED SUSCEPTIBILITIES TO FOLLOW    Report Status PENDING  Incomplete   Organism ID, Bacteria STAPHYLOCOCCUS AUREUS  Final      Susceptibility   Staphylococcus aureus - MIC*    CIPROFLOXACIN <=0.5 SENSITIVE Sensitive     ERYTHROMYCIN 0.5 SENSITIVE Sensitive     GENTAMICIN <=0.5 SENSITIVE Sensitive     OXACILLIN 0.5 SENSITIVE Sensitive     TETRACYCLINE >=16 RESISTANT Resistant     VANCOMYCIN 1 SENSITIVE Sensitive     TRIMETH/SULFA <=10 SENSITIVE Sensitive     CLINDAMYCIN <=0.25 SENSITIVE Sensitive     RIFAMPIN <=0.5 SENSITIVE Sensitive     Inducible Clindamycin NEGATIVE Sensitive     * STAPHYLOCOCCUS AUREUS  Blood Culture ID Panel (Reflexed)     Status: Abnormal   Collection Time: 03/14/16 12:20 PM  Result Value Ref Range Status   Enterococcus species NOT DETECTED NOT DETECTED Final   Listeria monocytogenes NOT DETECTED NOT DETECTED Final   Staphylococcus species DETECTED (A) NOT DETECTED Final    Comment: CRITICAL RESULT CALLED TO, READ BACK BY AND VERIFIED WITH: CARON AMEND, PHARMD @0509  03/15/16 MKELLY    Staphylococcus aureus DETECTED (A) NOT DETECTED Final    Comment: CRITICAL RESULT CALLED TO, READ BACK BY AND VERIFIED WITH: CARON AMEND,PHARMD @0509  03/15/16 MKELLY    Methicillin resistance NOT DETECTED NOT DETECTED Final   Streptococcus species DETECTED (A) NOT DETECTED Final    Comment: CRITICAL RESULT CALLED TO, READ BACK BY AND VERIFIED WITH: CARON AMEND,PHARMD @0509  03/15/16 MKELLY    Streptococcus agalactiae DETECTED (A) NOT DETECTED Final    Comment: CRITICAL RESULT CALLED TO, READ BACK BY AND VERIFIED WITH: CARON AMEND,PHARMD @0509  03/15/16 MKELLY    Streptococcus pneumoniae NOT DETECTED NOT DETECTED Final   Streptococcus pyogenes NOT DETECTED NOT DETECTED Final   Acinetobacter baumannii NOT DETECTED NOT DETECTED Final   Enterobacteriaceae species NOT  DETECTED NOT DETECTED Final   Enterobacter cloacae complex NOT DETECTED NOT DETECTED Final   Escherichia coli NOT DETECTED NOT DETECTED Final   Klebsiella oxytoca NOT DETECTED NOT DETECTED Final   Klebsiella pneumoniae NOT DETECTED NOT DETECTED Final   Proteus species NOT DETECTED NOT DETECTED Final   Serratia marcescens NOT DETECTED NOT DETECTED Final   Haemophilus influenzae NOT DETECTED NOT DETECTED Final   Neisseria meningitidis NOT DETECTED NOT DETECTED Final   Pseudomonas aeruginosa NOT DETECTED NOT DETECTED Final   Candida albicans NOT DETECTED NOT DETECTED Final  Candida glabrata NOT DETECTED NOT DETECTED Final   Candida krusei NOT DETECTED NOT DETECTED Final   Candida parapsilosis NOT DETECTED NOT DETECTED Final   Candida tropicalis NOT DETECTED NOT DETECTED Final  Blood Culture (routine x 2)     Status: None (Preliminary result)   Collection Time: 03/14/16 12:50 PM  Result Value Ref Range Status   Specimen Description BLOOD RIGHT HAND  Final   Special Requests BOTTLES DRAWN AEROBIC AND ANAEROBIC 5ML EACH  Final   Culture NO GROWTH 2 DAYS  Final   Report Status PENDING  Incomplete  Urine culture     Status: Abnormal   Collection Time: 03/14/16  1:14 PM  Result Value Ref Range Status   Specimen Description URINE, RANDOM  Final   Special Requests NONE  Final   Culture MULTIPLE SPECIES PRESENT, SUGGEST RECOLLECTION (A)  Final   Report Status 03/15/2016 FINAL  Final  Culture, blood (routine x 2)     Status: None (Preliminary result)   Collection Time: 03/15/16  3:44 PM  Result Value Ref Range Status   Specimen Description BLOOD LEFT HAND  Final   Special Requests IN PEDIATRIC BOTTLE 3CC  Final   Culture NO GROWTH < 24 HOURS  Final   Report Status PENDING  Incomplete  Culture, blood (routine x 2)     Status: None (Preliminary result)   Collection Time: 03/15/16  3:54 PM  Result Value Ref Range Status   Specimen Description BLOOD LEFT ANTECUBITAL  Final   Special Requests  IN PEDIATRIC BOTTLE 2CC  Final   Culture NO GROWTH < 24 HOURS  Final   Report Status PENDING  Incomplete     ASSESSMENT: She had transient MSSA and group B strep bacteremia. Only one of 2 admission blood cultures is positive. Repeat blood cultures are negative. There is no evidence of endocarditis by exam or TTE. I recommend 10 more days of IV ceftriaxone. It is okay to go ahead and have a PICC placed  PLAN: 1. Continue ceftriaxone for 10 more days through 03/27/2016 2. PICC placement 3. I will sign off now but please call if we can be of further assistance while she is here  Michel Bickers, [place] for [place] [phone] pager   [phone] cell 03/17/2016, 12:39 PM

## 2016-03-17 NOTE — Progress Notes (Signed)
[facility]  Patient Status: New pt for [facility] this admission  [facility] is providing the following services: HHRN and [facility] for home IV ABX.  [facility] is set for DC once PICC is placed and pt has DC order for home.  [facility] hospital team will follow pt and support DC to home when pt is ready.   If patient discharges after hours, please call [phone].   Larry Sierras 03/17/2016, 3:23 PM

## 2016-03-17 NOTE — Progress Notes (Signed)
Peripherally Inserted Central Catheter/Midline Placement  The IV Nurse has discussed with the patient and/or persons authorized to consent for the patient, the purpose of this procedure and the potential benefits and risks involved with this procedure.  The benefits include less needle sticks, lab draws from the catheter, ability to perform PICC exchange if ordered by the physician and patient may be discharged home with the catheter.  Risks include, but not limited to, infection, bleeding, blood clot (thrombus formation), and puncture of an artery; nerve damage and irregular heat beat.  Alternatives to this procedure were also discussed.  Bard educational packet left at bedside.  PICC/Midline Placement Documentation  PICC Single Lumen [id number] PICC Right Basilic 36 cm 0 cm (Active)  Indication for Insertion or Continuance of Line Prolonged intravenous therapies;Home intravenous therapies (PICC only) 03/17/2016  6:55 PM  Exposed Catheter (cm) 0 cm 03/17/2016  6:55 PM  Site Assessment Clean;Intact;Dry 03/17/2016  6:55 PM  Line Status Flushed;Saline locked;Blood return noted 03/17/2016  6:55 PM  Dressing Type Transparent 03/17/2016  6:55 PM  Dressing Status Clean;Dry;Intact;Antimicrobial disc in place 03/17/2016  6:55 PM  Line Care Connections checked and tightened 03/17/2016  6:55 PM  Line Adjustment (NICU/IV Team Only) No 03/17/2016  6:55 PM  Dressing Intervention New dressing 03/17/2016  6:55 PM  Dressing Change Due 03/24/16 03/17/2016  6:55 PM       [name] 03/17/2016, 6:56 PM

## 2016-03-18 LAB — GLUCOSE, CAPILLARY: Glucose-Capillary: 134 mg/dL — ABNORMAL HIGH (ref 65–99)

## 2016-03-18 LAB — CULTURE, BLOOD (ROUTINE X 2)

## 2016-03-18 MED ORDER — HEPARIN SOD (PORK) LOCK FLUSH 100 UNIT/ML IV SOLN
250.0000 [IU] | INTRAVENOUS | Status: AC | PRN
  Administered 2016-03-18: 250 [IU]

## 2016-03-19 LAB — CULTURE, BLOOD (ROUTINE X 2): CULTURE: NO GROWTH

## 2016-03-20 LAB — CULTURE, BLOOD (ROUTINE X 2)
CULTURE: NO GROWTH
Culture: NO GROWTH

## 2016-03-28 ENCOUNTER — Telehealth: Payer: Self-pay

## 2016-03-28 NOTE — Telephone Encounter (Signed)
[facility] nurse and patient called wanting to know when it was OK to pull the PICC. Patient last day of antibiotic was yesterday the 10th. Please advise.

## 2016-03-28 NOTE — Telephone Encounter (Signed)
Patient nurse with [facility] is Burman Nieves [phone]

## 2016-03-29 NOTE — Telephone Encounter (Signed)
Please give an order to remove the PICC.

## 2016-04-16 ENCOUNTER — Emergency Department (HOSPITAL_BASED_OUTPATIENT_CLINIC_OR_DEPARTMENT_OTHER): Payer: BLUE CROSS/BLUE SHIELD

## 2016-04-16 ENCOUNTER — Emergency Department (HOSPITAL_BASED_OUTPATIENT_CLINIC_OR_DEPARTMENT_OTHER)
Admission: EM | Admit: 2016-04-16 | Discharge: 2016-04-16 | Disposition: A | Discharge status: Home / Self Care | Payer: BLUE CROSS/BLUE SHIELD | Attending: Emergency Medicine | Admitting: Emergency Medicine

## 2016-04-16 ENCOUNTER — Encounter (HOSPITAL_BASED_OUTPATIENT_CLINIC_OR_DEPARTMENT_OTHER): Payer: Self-pay | Admitting: Emergency Medicine

## 2016-04-16 DIAGNOSIS — E119 Type 2 diabetes mellitus without complications: Secondary | ICD-10-CM | POA: Insufficient documentation

## 2016-04-16 DIAGNOSIS — Z794 Long term (current) use of insulin: Secondary | ICD-10-CM | POA: Insufficient documentation

## 2016-04-16 DIAGNOSIS — Z7984 Long term (current) use of oral hypoglycemic drugs: Secondary | ICD-10-CM | POA: Insufficient documentation

## 2016-04-16 DIAGNOSIS — R600 Localized edema: Secondary | ICD-10-CM | POA: Diagnosis not present

## 2016-04-16 DIAGNOSIS — Z79899 Other long term (current) drug therapy: Secondary | ICD-10-CM | POA: Insufficient documentation

## 2016-04-16 DIAGNOSIS — R0609 Other forms of dyspnea: Secondary | ICD-10-CM

## 2016-04-16 DIAGNOSIS — I10 Essential (primary) hypertension: Secondary | ICD-10-CM | POA: Insufficient documentation

## 2016-04-16 DIAGNOSIS — M7989 Other specified soft tissue disorders: Secondary | ICD-10-CM | POA: Diagnosis present

## 2016-04-16 LAB — CBC WITH DIFFERENTIAL/PLATELET
Basophils Absolute: 0.1 10*3/uL (ref 0.0–0.1)
Basophils Relative: 1 %
Eosinophils Absolute: 0.3 10*3/uL (ref 0.0–0.7)
Eosinophils Relative: 3 %
HEMATOCRIT: 30.8 % — AB (ref 36.0–46.0)
Hemoglobin: 10.4 g/dL — ABNORMAL LOW (ref 12.0–15.0)
LYMPHS PCT: 39 %
Lymphs Abs: 3.6 10*3/uL (ref 0.7–4.0)
MCH: 27 pg (ref 26.0–34.0)
MCHC: 33.8 g/dL (ref 30.0–36.0)
MCV: 80 fL (ref 78.0–100.0)
MONO ABS: 0.6 10*3/uL (ref 0.1–1.0)
MONOS PCT: 6 %
NEUTROS ABS: 4.7 10*3/uL (ref 1.7–7.7)
Neutrophils Relative %: 51 %
Platelets: 334 10*3/uL (ref 150–400)
RBC: 3.85 MIL/uL — ABNORMAL LOW (ref 3.87–5.11)
RDW: 13.3 % (ref 11.5–15.5)
WBC: 9.3 10*3/uL (ref 4.0–10.5)

## 2016-04-16 LAB — URINALYSIS, ROUTINE W REFLEX MICROSCOPIC
BILIRUBIN URINE: NEGATIVE
Ketones, ur: NEGATIVE mg/dL
Leukocytes, UA: NEGATIVE
Nitrite: NEGATIVE
PH: 5 (ref 5.0–8.0)
Protein, ur: >300 mg/dL — AB
SPECIFIC GRAVITY, URINE: 1.028 (ref 1.005–1.030)

## 2016-04-16 LAB — BRAIN NATRIURETIC PEPTIDE: B Natriuretic Peptide: 44.5 pg/mL (ref 0.0–100.0)

## 2016-04-16 LAB — COMPREHENSIVE METABOLIC PANEL
ALBUMIN: 3.3 g/dL — AB (ref 3.5–5.0)
ALT: 13 U/L — ABNORMAL LOW (ref 14–54)
ANION GAP: 7 (ref 5–15)
AST: 14 U/L — ABNORMAL LOW (ref 15–41)
Alkaline Phosphatase: 65 U/L (ref 38–126)
BUN: 14 mg/dL (ref 6–20)
CHLORIDE: 105 mmol/L (ref 101–111)
CO2: 22 mmol/L (ref 22–32)
Calcium: 9.2 mg/dL (ref 8.9–10.3)
Creatinine, Ser: 0.73 mg/dL (ref 0.44–1.00)
GFR calc Af Amer: >60 mL/min (ref >60)
GFR calc non Af Amer: >60 mL/min (ref >60)
GLUCOSE: 334 mg/dL — AB (ref 65–99)
POTASSIUM: 3.7 mmol/L (ref 3.5–5.1)
SODIUM: 134 mmol/L — AB (ref 135–145)
Total Bilirubin: 0.5 mg/dL (ref 0.3–1.2)
Total Protein: 7 g/dL (ref 6.5–8.1)

## 2016-04-16 LAB — URINE MICROSCOPIC-ADD ON: WBC, UA: NONE SEEN WBC/hpf (ref 0–5)

## 2016-04-16 LAB — CBG MONITORING, ED: Glucose-Capillary: 318 mg/dL — ABNORMAL HIGH (ref 65–99)

## 2016-04-16 LAB — I-STAT CG4 LACTIC ACID, ED: LACTIC ACID, VENOUS: 1.21 mmol/L (ref 0.5–1.9)

## 2016-04-16 LAB — PREGNANCY, URINE: PREG TEST UR: NEGATIVE

## 2016-04-16 LAB — TROPONIN I: Troponin I: <0.03 ng/mL (ref <0.03)

## 2016-04-16 MED ORDER — IOPAMIDOL (ISOVUE-370) INJECTION 76%
100.0000 mL | Freq: Once | INTRAVENOUS | Status: AC | PRN
  Administered 2016-04-16: 100 mL via INTRAVENOUS

## 2016-04-16 NOTE — ED Notes (Signed)
Patient transported to Ultrasound 

## 2016-04-16 NOTE — ED Triage Notes (Addendum)
Pt c/o leg and feet swelling for past week.  States h/o same but that normally the swelling goes away within a day or two.  Pt also reports DOE.  Pt d/c'd from [facility] 1 month ago for bacterial blood stream infection and tx'd at home with IV antibiotics.  Antibiotics tx completed 03/27/16.

## 2016-04-16 NOTE — ED Notes (Signed)
Attempted 2nd blood culture x2, EDP advises 2nd culture not necessary.

## 2016-04-16 NOTE — ED Provider Notes (Signed)
[place] DEPT [facility] Provider Note   CSN: [id number] Arrival date & time: 04/16/16  1758  By signing my name below, I, Dolores Hoose, attest that this documentation has been prepared under the direction and in the presence of Sharlett Iles, MD . Electronically Signed: Dolores Hoose, Scribe. 04/16/2016. 6:22 PM.  History   Chief Complaint Chief Complaint  Patient presents with  . Leg Swelling  . Shortness of Breath   The history is provided by the patient. No language interpreter was used.    HPI Comments:  Jacqueline [name] is a 35 y.o. female with PMHx of DM and HTN who presents to the Emergency Department complaining of leg swelling beginning about a week ago. Pt describes associated lower extremity pain that she describes as a "shooting" pain that travels up from her anterior lower leg, that is worse on the right side. Pt was seen about a month ago in the ED for a  blood stream infection, which was relieved by abx, which she finished on 03/27/2016. They were unable to identify source of infection.  Pt also complains of new intermittent SOB occurring over the past couple of days. She states that her SOB is worsened by exertion and laying down, and sometimes she will wake up with shortness of breath. She endorses associated fever 101 this morning, dry cough, and urinary frequency. She denies any dysuria, abdominal pain, diarrhea, or weight change. Pt is also compliant with her DM medication, and has not changed her doses anytime recently. She was seen in the ED in June 2017 and underwent cardiac catheterization, which she states was normal at the time.    Past Medical History:  Diagnosis Date  . Anemia   . Anxiety   . Chronic bronchitis ([place])   . Hypertension   . Morbid obesity ([place])   . Type II diabetes mellitus [facility])     Patient Active Problem List   Diagnosis Date Noted  . Staphylococcus aureus bacteremia 03/15/2016  . Streptococcal bacteremia 03/15/2016  . Tachycardia  03/14/2016  . Fever 03/14/2016  . Headache 03/14/2016  . Morbid obesity ([place])   . Anxiety   . Chest pain at rest   . HTN (hypertension) 12/22/2015  . Diabetes ([place]) 01/01/2014    Past Surgical History:  Procedure Laterality Date  . CARDIAC CATHETERIZATION  01/06/2016  . CARDIAC CATHETERIZATION N/A 01/06/2016   Procedure: Left Heart Cath and Coronary Angiography;  Surgeon: Wellington Hampshire, MD;  Location: [place] CV LAB;  Service: Cardiovascular;  Laterality: N/A;  . CESAREAN SECTION  08/2014    OB History    Gravida Para Term Preterm AB Living   1             SAB TAB Ectopic Multiple Live Births                   Home Medications    Prior to Admission medications   Medication Sig Start Date End Date Taking? Authorizing Provider  ALPRAZolam Duanne Moron) 0.5 MG tablet Take 0.5 mg by mouth daily as needed for anxiety. 02/19/16  Yes Historical Provider, MD  glipiZIDE (GLUCOTROL) 10 MG tablet Take 10 mg by mouth daily.   Yes Historical Provider, MD  Insulin Glargine (BASAGLAR KWIKPEN) 100 UNIT/ML SOPN Inject 42 Units into the skin at bedtime.   Yes Historical Provider, MD  Iron-FA-B Cmp-C-Biot-Probiotic (FUSION PLUS) CAPS Take 1 capsule by mouth daily.   Yes Historical Provider, MD  losartan (COZAAR) 100 MG tablet Take 1  tablet (100 mg total) by mouth daily. 12/23/15  Yes Ripudeep Krystal Eaton, MD  metFORMIN (GLUCOPHAGE) 1000 MG tablet Take 1,000-1,500 mg by mouth daily with breakfast. Takes 1500 mg in the mornings and 1000 mg in the evenings.   Yes Historical Provider, MD  pantoprazole (PROTONIX) 40 MG tablet Take 1 tablet (40 mg total) by mouth daily. 12/23/15  Yes Ripudeep Krystal Eaton, MD  sertraline (ZOLOFT) 50 MG tablet Take 50 mg by mouth daily. 02/15/16  Yes Historical Provider, MD  acetaminophen (TYLENOL) 325 MG tablet Take 2 tablets (650 mg total) by mouth every 6 (six) hours as needed for mild pain (or Fever >/= 101). 03/17/16   Kelvin Cellar, MD  docusate sodium (COLACE) 100 MG capsule Take 1  capsule (100 mg total) by mouth 2 (two) times daily. 03/17/16   Kelvin Cellar, MD  polyethylene glycol (MIRALAX / GLYCOLAX) packet Take 17 g by mouth daily. 03/17/16   Kelvin Cellar, MD    Family History Family History  Problem Relation Age of Onset  . Diabetes Mother   . Hypertension Mother   . Thyroid disease Mother   . Kidney disease Maternal Grandmother   . Diabetes Maternal Grandmother   . Heart attack      Social History Social History  Substance Use Topics  . Smoking status: Never Smoker  . Smokeless tobacco: Never Used  . Alcohol use No     Allergies   Review of patient's allergies indicates no known allergies.   Review of Systems Review of Systems 10 systems reviewed and all are negative for acute change except as noted in the HPI.  Physical Exam Updated Vital Signs BP 148/88 (BP Location: Right Arm)   Pulse 97   Temp 99.1 F (37.3 C) (Oral)   Resp 13   Ht 5\' 4"  (1.626 m)   Wt 284 lb (128.8 kg)   LMP 03/20/2016 (Approximate)   SpO2 100%   BMI 48.75 kg/m   Physical Exam  Constitutional: She is oriented to person, place, and time. She appears well-developed and well-nourished. No distress.  HENT:  Head: Normocephalic and atraumatic.  Moist mucous membranes  Eyes: Conjunctivae are normal. Pupils are equal, round, and reactive to light.  Neck: Neck supple. JVD present.  Cardiovascular: Normal rate, regular rhythm and normal heart sounds.   No murmur heard. Pulmonary/Chest: Breath sounds normal. No respiratory distress.  Mild dyspnea  Abdominal: Soft. Bowel sounds are normal. She exhibits no distension. There is no tenderness.  Musculoskeletal: She exhibits edema (3+ pitting edema BLE to knees).  Neurological: She is alert and oriented to person, place, and time.  Fluent speech  Skin: Skin is warm and dry. No rash noted. No erythema.  Psychiatric: She has a normal mood and affect. Judgment normal.  Nursing note and vitals reviewed.    ED  Treatments / Results  DIAGNOSTIC STUDIES:  Oxygen Saturation is 99% on RA, normal by my interpretation.    COORDINATION OF CARE:  6:42 PM Discussed treatment plan with pt at bedside which included imaging and pt agreed to plan.  Labs (all labs ordered are listed, but only abnormal results are displayed) Labs Reviewed  COMPREHENSIVE METABOLIC PANEL - Abnormal; Notable for the following:       Result Value   Sodium 134 (*)    Glucose, Bld 334 (*)    Albumin 3.3 (*)    AST 14 (*)    ALT 13 (*)    All other components within normal limits  CBC WITH DIFFERENTIAL/PLATELET - Abnormal; Notable for the following:    RBC 3.85 (*)    Hemoglobin 10.4 (*)    HCT 30.8 (*)    All other components within normal limits  URINALYSIS, ROUTINE W REFLEX MICROSCOPIC (NOT AT [facility]) - Abnormal; Notable for the following:    Glucose, UA >1000 (*)    Hgb urine dipstick MODERATE (*)    Protein, ur >300 (*)    All other components within normal limits  URINE MICROSCOPIC-ADD ON - Abnormal; Notable for the following:    Squamous Epithelial / LPF 0-5 (*)    Bacteria, UA RARE (*)    All other components within normal limits  CBG MONITORING, ED - Abnormal; Notable for the following:    Glucose-Capillary 318 (*)    All other components within normal limits  CULTURE, BLOOD (ROUTINE X 2)  CULTURE, BLOOD (ROUTINE X 2)  URINE CULTURE  BRAIN NATRIURETIC PEPTIDE  TROPONIN I  PREGNANCY, URINE  I-STAT CG4 LACTIC ACID, ED    EKG  EKG Interpretation  Date/Time:  Saturday April 16 2016 18:42:28 EDT Ventricular Rate:  103 PR Interval:    QRS Duration: 84 QT Interval:  333 QTC Calculation: 436 R Axis:   77 Text Interpretation:  Sinus tachycardia No significant change since last tracing Confirmed by Kemiah Booz MD, Jaelen Gellerman [phone]) on 04/16/2016 7:01:57 PM       Radiology Dg Chest 2 View  Result Date: 04/16/2016 CLINICAL DATA:  Acute onset of bilateral leg swelling, dry cough and shortness of breath.  Initial encounter. EXAM: CHEST  2 VIEW COMPARISON:  Chest radiograph performed 03/14/2016 FINDINGS: The lungs are well-aerated and clear. There is no evidence of focal opacification, pleural effusion or pneumothorax. The heart is normal in size; the mediastinal contour is within normal limits. No acute osseous abnormalities are seen. IMPRESSION: No acute cardiopulmonary process seen. Electronically Signed   By: Garald Balding M.D.   On: 04/16/2016 19:35   Ct Angio Chest Pe W/cm &/or Wo Cm  Result Date: 04/16/2016 CLINICAL DATA:  C/o sudden-onset unchanged leg swelling beginning about a week ago. Pt describes associated lower extremity pain that she describes as a "shooting" pain that travels up from her anterior lower leg, that is worse on the right side. EXAM: CT ANGIOGRAPHY CHEST WITH CONTRAST TECHNIQUE: Multidetector CT imaging of the chest was performed using the standard protocol during bolus administration of intravenous contrast. Multiplanar CT image reconstructions and MIPs were obtained to evaluate the vascular anatomy. CONTRAST:  100 mL Isovue COMPARISON:  None. FINDINGS: Cardiovascular: No filling defects within the pulmonary arteries to suggest acute pulmonary embolism. No acute findings aorta or great vessels. No pericardial fluid. Exam is suboptimal primarily due to body habitus. Mediastinum/Nodes: No mediastinal hilar adenopathy. Lungs/Pleura: No pleural fluid or pneumothorax.  Airspace disease. Upper Abdomen: Limited view of the liver, kidneys, pancreas are unremarkable. Normal adrenal glands. Musculoskeletal: No aggressive osseous lesion. Continuous osteophytosis through the thoracic spine. Review of the MIP images confirms the above findings. IMPRESSION: 1. No evidence acute pulmonary embolism. Exam is suboptimal secondary to body habitus. 2. No acute pulmonary parenchymal abnormality. Electronically Signed   By: Suzy Bouchard M.D.   On: 04/16/2016 21:05   US Venous Img Lower  Bilateral  Result Date: 04/16/2016 CLINICAL DATA:  Acute onset of shortness of breath. Recent sepsis. Bilateral lower extremity swelling, right worse than left. Initial encounter. EXAM: BILATERAL LOWER EXTREMITY VENOUS DOPPLER ULTRASOUND TECHNIQUE: Gray-scale sonography with graded compression, as well as color  Doppler and duplex ultrasound were performed to evaluate the lower extremity deep venous systems from the level of the common femoral vein and including the common femoral, femoral, profunda femoral, popliteal and calf veins including the posterior tibial, peroneal and gastrocnemius veins when visible. The superficial great saphenous vein was also interrogated. Spectral Doppler was utilized to evaluate flow at rest and with distal augmentation maneuvers in the common femoral, femoral and popliteal veins. COMPARISON:  None. FINDINGS: RIGHT LOWER EXTREMITY Common Femoral Vein: No evidence of thrombus. Normal compressibility, respiratory phasicity and response to augmentation. Saphenofemoral Junction: No evidence of thrombus. Normal compressibility and flow on color Doppler imaging. Profunda Femoral Vein: No evidence of thrombus. Normal compressibility and flow on color Doppler imaging. Femoral Vein: No evidence of thrombus. Normal compressibility, respiratory phasicity and response to augmentation. Popliteal Vein: No evidence of thrombus. Normal compressibility, respiratory phasicity and response to augmentation. Calf Veins: No evidence of thrombus. Normal compressibility and flow on color Doppler imaging. The peroneal vein is not visualized due to the patient's habitus. Superficial Great Saphenous Vein: No evidence of thrombus. Normal compressibility and flow on color Doppler imaging. Venous Reflux:  None. Other Findings:  None. LEFT LOWER EXTREMITY Common Femoral Vein: No evidence of thrombus. Normal compressibility, respiratory phasicity and response to augmentation. Saphenofemoral Junction: No evidence of  thrombus. Normal compressibility and flow on color Doppler imaging. Profunda Femoral Vein: No evidence of thrombus. Normal compressibility and flow on color Doppler imaging. Femoral Vein: No evidence of thrombus. Normal compressibility, respiratory phasicity and response to augmentation. Popliteal Vein: No evidence of thrombus. Normal compressibility, respiratory phasicity and response to augmentation. Calf Veins: No evidence of thrombus. Normal compressibility and flow on color Doppler imaging. The peroneal vein is not visualized due to the patient's habitus. Superficial Great Saphenous Vein: No evidence of thrombus. Normal compressibility and flow on color Doppler imaging. Venous Reflux:  None. Other Findings:  None. IMPRESSION: No evidence of deep venous thrombosis. Electronically Signed   By: Garald Balding M.D.   On: 04/16/2016 19:37    Procedures Procedures (including critical care time)  Medications Ordered in ED Medications  iopamidol (ISOVUE-370) 76 % injection 100 mL (100 mLs Intravenous Contrast Given 04/16/16 2009)     Initial Impression / Assessment and Plan / ED Course  I have reviewed the triage vital signs and the nursing notes.  Pertinent labs & imaging results that were available during my care of the patient were reviewed by me and considered in my medical decision making (see chart for details).  Clinical Course   Patient with recent hospitalization for sepsis of unknown source presents with 1 week of progressive bilateral leg swelling, dyspnea on exertion, and fever of 101 this morning. No complaints of chest pain. She was awake and alert, no distress on exam. Vitals notable for BP 171/122, heart rate 112, temp 99.1. Pain 99% on room air. No respiratory distress on exam. Significant pitting bilateral lower extremity edema as well as JVD noted. Obtained above lab work including BNP. Ordered chest x-ray as well as bilateral lower extremity ultrasound given patient's recent  hospitalization as risk factor for blood clots.  Labs show unremarkable UA and lactate, CBC consistent with previous with normal WBC count, glucose elevated at 334 but otherwise unremarkable CMP. BNP and troponin negative. Chest x-ray unremarkable and bilateral lower extremity ultrasounds are negative for DVT. Because of patient's risk of PE with recent hospitalization and sepsis, obtained CTA of chest which showed no evidence of PE or infiltrate. No  abnormalities to explain the patient's exertional dyspnea. Pt well-appearing on reexamination with reassuring vital signs. She has been afebrile without any medications. The patient has no evidence of cardiac, renal, or liver pathology to explain peripheral edema. I've discussed supportive care including compression stockings, low-salt diet, and follow up with PCP if symptoms do not improved. Extensively reviewed return precautions including any chest pain or worsening symptoms. Patient voiced understanding and was discharged in satisfactory condition.  Final Clinical Impressions(s) / ED Diagnoses   Final diagnoses:  Bilateral edema of lower extremity  Exertional dyspnea    New Prescriptions New Prescriptions   No medications on file  I personally performed the services described in this documentation, which was scribed in my presence. The recorded information has been reviewed and is accurate.     Sharlett Iles, MD 04/16/16 2136

## 2016-04-18 LAB — URINE CULTURE: Special Requests: NORMAL

## 2016-04-22 LAB — CULTURE, BLOOD (ROUTINE X 2): CULTURE: NO GROWTH

## 2016-04-29 ENCOUNTER — Encounter: Payer: Self-pay | Admitting: Internal Medicine

## 2016-05-18 ENCOUNTER — Emergency Department (HOSPITAL_BASED_OUTPATIENT_CLINIC_OR_DEPARTMENT_OTHER)
Admit: 2016-05-18 | Discharge: 2016-05-18 | Disposition: A | Discharge status: Home / Self Care | Payer: BLUE CROSS/BLUE SHIELD

## 2016-05-18 ENCOUNTER — Encounter (HOSPITAL_COMMUNITY): Payer: Self-pay | Admitting: *Deleted

## 2016-05-18 ENCOUNTER — Emergency Department (HOSPITAL_COMMUNITY): Payer: BLUE CROSS/BLUE SHIELD

## 2016-05-18 ENCOUNTER — Emergency Department (HOSPITAL_COMMUNITY)
Admission: EM | Admit: 2016-05-18 | Discharge: 2016-05-18 | Disposition: A | Discharge status: Home / Self Care | Payer: BLUE CROSS/BLUE SHIELD | Attending: Emergency Medicine | Admitting: Emergency Medicine

## 2016-05-18 DIAGNOSIS — Z7984 Long term (current) use of oral hypoglycemic drugs: Secondary | ICD-10-CM | POA: Insufficient documentation

## 2016-05-18 DIAGNOSIS — M5442 Lumbago with sciatica, left side: Secondary | ICD-10-CM | POA: Diagnosis not present

## 2016-05-18 DIAGNOSIS — E119 Type 2 diabetes mellitus without complications: Secondary | ICD-10-CM | POA: Diagnosis not present

## 2016-05-18 DIAGNOSIS — Z79899 Other long term (current) drug therapy: Secondary | ICD-10-CM | POA: Diagnosis not present

## 2016-05-18 DIAGNOSIS — M79609 Pain in unspecified limb: Secondary | ICD-10-CM

## 2016-05-18 DIAGNOSIS — Z955 Presence of coronary angioplasty implant and graft: Secondary | ICD-10-CM | POA: Insufficient documentation

## 2016-05-18 DIAGNOSIS — I10 Essential (primary) hypertension: Secondary | ICD-10-CM | POA: Diagnosis not present

## 2016-05-18 DIAGNOSIS — M79605 Pain in left leg: Secondary | ICD-10-CM | POA: Diagnosis present

## 2016-05-18 DIAGNOSIS — Z794 Long term (current) use of insulin: Secondary | ICD-10-CM | POA: Insufficient documentation

## 2016-05-18 DIAGNOSIS — M5432 Sciatica, left side: Secondary | ICD-10-CM

## 2016-05-18 LAB — CBC WITH DIFFERENTIAL/PLATELET
BASOS ABS: 0 10*3/uL (ref 0.0–0.1)
BASOS PCT: 0 %
EOS PCT: 2 %
Eosinophils Absolute: 0.3 10*3/uL (ref 0.0–0.7)
HCT: 32.6 % — ABNORMAL LOW (ref 36.0–46.0)
Hemoglobin: 11 g/dL — ABNORMAL LOW (ref 12.0–15.0)
LYMPHS PCT: 31 %
Lymphs Abs: 3.3 10*3/uL (ref 0.7–4.0)
MCH: 26.8 pg (ref 26.0–34.0)
MCHC: 33.7 g/dL (ref 30.0–36.0)
MCV: 79.3 fL (ref 78.0–100.0)
Monocytes Absolute: 0.5 10*3/uL (ref 0.1–1.0)
Monocytes Relative: 5 %
Neutro Abs: 6.5 10*3/uL (ref 1.7–7.7)
Neutrophils Relative %: 62 %
PLATELETS: 333 10*3/uL (ref 150–400)
RBC: 4.11 MIL/uL (ref 3.87–5.11)
RDW: 13.8 % (ref 11.5–15.5)
WBC: 10.6 10*3/uL — AB (ref 4.0–10.5)

## 2016-05-18 LAB — BASIC METABOLIC PANEL
Anion gap: 8 (ref 5–15)
BUN: 15 mg/dL (ref 6–20)
CO2: 22 mmol/L (ref 22–32)
Calcium: 8.9 mg/dL (ref 8.9–10.3)
Chloride: 104 mmol/L (ref 101–111)
Creatinine, Ser: 0.91 mg/dL (ref 0.44–1.00)
GFR calc Af Amer: >60 mL/min (ref >60)
GLUCOSE: 392 mg/dL — AB (ref 65–99)
POTASSIUM: 4 mmol/L (ref 3.5–5.1)
SODIUM: 134 mmol/L — AB (ref 135–145)

## 2016-05-18 MED ORDER — HYDROCODONE-ACETAMINOPHEN 5-325 MG PO TABS: 1.0000 | ORAL_TABLET | Freq: Four times a day (QID) | ORAL | 0 refills | Status: DC | PRN

## 2016-05-18 MED ORDER — CYCLOBENZAPRINE HCL 10 MG PO TABS: 10.0000 mg | ORAL_TABLET | Freq: Two times a day (BID) | ORAL | 0 refills | Status: DC | PRN

## 2016-05-18 MED ORDER — HYDROCODONE-ACETAMINOPHEN 5-325 MG PO TABS
2.0000 | ORAL_TABLET | Freq: Once | ORAL | Status: AC
  Administered 2016-05-18: 2 via ORAL
  Filled 2016-05-18: qty 2

## 2016-05-18 NOTE — ED Notes (Signed)
Patient physically moved from hallway to room A3

## 2016-05-18 NOTE — ED Provider Notes (Signed)
[place] DEPT Provider Note   CSN: [id number] Arrival date & time: 05/18/16  1140     History   Chief Complaint Chief Complaint  Patient presents with  . Leg Pain    HPI Jacqueline [name] is a 35 y.o. female.  Patient with past medical history of diabetes, anxiety, and hypertension presents to the emergency department with chief complaint of left leg pain. She states the pain radiates from her left low back down the back of her left leg. She denies any numbness, discomfort or tingling. Denies any bowel or bladder incontinence. Denies any known injury or trauma to the left leg. Additionally, patient also reports having a cough which she has had for the past several days. She denies any fevers chills. Denies taking anything for symptoms. There are no other associated symptoms.   The history is provided by the patient. No language interpreter was used.    Past Medical History:  Diagnosis Date  . Anemia   . Anxiety   . Chronic bronchitis ([place])   . Hypertension   . Morbid obesity ([place])   . Type II diabetes mellitus [facility])     Patient Active Problem List   Diagnosis Date Noted  . Staphylococcus aureus bacteremia 03/15/2016  . Streptococcal bacteremia 03/15/2016  . Tachycardia 03/14/2016  . Fever 03/14/2016  . Headache 03/14/2016  . Morbid obesity ([place])   . Anxiety   . Chest pain at rest   . HTN (hypertension) 12/22/2015  . Diabetes ([place]) 01/01/2014    Past Surgical History:  Procedure Laterality Date  . CARDIAC CATHETERIZATION  01/06/2016  . CARDIAC CATHETERIZATION N/A 01/06/2016   Procedure: Left Heart Cath and Coronary Angiography;  Surgeon: Wellington Hampshire, MD;  Location: [place] [facility] LAB;  Service: Cardiovascular;  Laterality: N/A;  . CESAREAN SECTION  08/2014    OB History    Gravida Para Term Preterm AB Living   1             SAB TAB Ectopic Multiple Live Births                   Home Medications    Prior to Admission medications   Medication Sig  Start Date End Date Taking? Authorizing Provider  ALPRAZolam Duanne Moron) 0.5 MG tablet Take 0.5 mg by mouth daily as needed for anxiety. 02/19/16  Yes Historical Provider, MD  glipiZIDE (GLUCOTROL) 10 MG tablet Take 10 mg by mouth daily.   Yes Historical Provider, MD  Insulin Glargine (BASAGLAR KWIKPEN) 100 UNIT/ML SOPN Inject 48 Units into the skin at bedtime.    Yes Historical Provider, MD  Iron-FA-B Cmp-C-Biot-Probiotic (FUSION PLUS) CAPS Take 1 capsule by mouth daily.   Yes Historical Provider, MD  losartan (COZAAR) 100 MG tablet Take 1 tablet (100 mg total) by mouth daily. 12/23/15  Yes Ripudeep Krystal Eaton, MD  metFORMIN (GLUCOPHAGE) 1000 MG tablet Take 1,500 mg by mouth daily with breakfast.    Yes Historical Provider, MD  pantoprazole (PROTONIX) 40 MG tablet Take 1 tablet (40 mg total) by mouth daily. 12/23/15  Yes Ripudeep Krystal Eaton, MD  sertraline (ZOLOFT) 50 MG tablet Take 50 mg by mouth daily. 02/15/16  Yes Historical Provider, MD  acetaminophen (TYLENOL) 325 MG tablet Take 2 tablets (650 mg total) by mouth every 6 (six) hours as needed for mild pain (or Fever >/= 101). Patient not taking: Reported on 05/18/2016 03/17/16   Kelvin Cellar, MD  docusate sodium (COLACE) 100 MG capsule Take 1 capsule (100  mg total) by mouth 2 (two) times daily. Patient not taking: Reported on 05/18/2016 03/17/16   Kelvin Cellar, MD  polyethylene glycol [facility] / GLYCOLAX) packet Take 17 g by mouth daily. Patient not taking: Reported on 05/18/2016 03/17/16   Kelvin Cellar, MD    Family History Family History  Problem Relation Age of Onset  . Diabetes Mother   . Hypertension Mother   . Thyroid disease Mother   . Kidney disease Maternal Grandmother   . Diabetes Maternal Grandmother   . Heart attack      Social History Social History  Substance Use Topics  . Smoking status: Never Smoker  . Smokeless tobacco: Never Used  . Alcohol use No     Allergies   Review of patient's allergies indicates no known  allergies.   Review of Systems Review of Systems  Constitutional: Negative for chills and fever.  Gastrointestinal:       No bowel incontinence  Genitourinary:       No urinary incontinence  Musculoskeletal: Positive for arthralgias, back pain and myalgias.  Neurological:       No saddle anesthesia  All other systems reviewed and are negative.    Physical Exam Updated Vital Signs BP 159/84 (BP Location: Right Arm)   Pulse 103   Temp 98.5 F (36.9 C) (Oral)   Resp 18   Ht 5\' 4"  (1.626 m)   Wt 127 kg   LMP 04/17/2016   SpO2 98%   BMI 48.06 kg/m   Physical Exam Physical Exam  Constitutional: Pt appears well-developed and well-nourished. No distress.  HENT:  Head: Normocephalic and atraumatic.  Mouth/Throat: Oropharynx is clear and moist. No oropharyngeal exudate.  Eyes: Conjunctivae are normal.  Neck: Normal range of motion. Neck supple.  No meningismus Cardiovascular: Normal rate, regular rhythm and intact distal pulses.   Pulmonary/Chest: Effort normal and breath sounds normal. No respiratory distress. Pt has no wheezes.  Abdominal: Pt exhibits no distension Musculoskeletal:  Left lumbar paraspinal muscles tender to palpation, no bony CTLS spine tenderness, deformity, step-off, or crepitus Lymphadenopathy: Pt has no cervical adenopathy.  Neurological: Pt is alert and oriented Speech is clear and goal oriented, follows commands Normal 5/5 strength in upper and lower extremities bilaterally including dorsiflexion and plantar flexion Sensation intact Great toe extension intact Moves extremities without ataxia, coordination intact Normal gait Normal balance No Clonus Skin: Skin is warm and dry. No rash noted. Pt is not diaphoretic. No erythema.  Psychiatric: Pt has a normal mood and affect. Behavior is normal.  Nursing note and vitals reviewed.   ED Treatments / Results  Labs (all labs ordered are listed, but only abnormal results are displayed) Labs Reviewed   CBC WITH DIFFERENTIAL/PLATELET - Abnormal; Notable for the following:       Result Value   WBC 10.6 (*)    Hemoglobin 11.0 (*)    HCT 32.6 (*)    All other components within normal limits  BASIC METABOLIC PANEL    EKG  EKG Interpretation None       Radiology Dg Chest 2 View  Result Date: 05/18/2016 CLINICAL DATA:  Chest cong/cough chills for 2 weeks EXAM: CHEST  2 VIEW COMPARISON:  04/16/2016 FINDINGS: Midline trachea. Normal heart size and mediastinal contours.Sharp costophrenic angles. No pneumothorax. Clear lungs. Moderate thoracic spondylosis. IMPRESSION: No active cardiopulmonary disease. Electronically Signed   By: Abigail Miyamoto M.D.   On: 05/18/2016 13:39    Procedures Procedures (including critical care time)  Medications Ordered in  ED Medications  HYDROcodone-acetaminophen (NORCO/VICODIN) 5-325 MG per tablet 2 tablet (2 tablets Oral Given 05/18/16 1349)     Initial Impression / Assessment and Plan / ED Course  I have reviewed the triage vital signs and the nursing notes.  Pertinent labs & imaging results that were available during my care of the patient were reviewed by me and considered in my medical decision making (see chart for details).  Clinical Course    Patient with back pain. I believe the patient's symptoms to be consistent with sciatica or lumbar radiculopathy. Given that she did have some tenderness along the posterior calf and thigh, a lower extremity Doppler venous ultrasound was obtained, which did not show any evidence of DVT. No neurological deficits and normal neuro exam.  Patient is ambulatory.  No loss of bowel or bladder control.  Doubt cauda equina.  Denies fever,  doubt epidural abscess or other lesion. Recommend back exercises, stretching, RICE, and will treat with a short course of flexeril.  Encouraged the patient that there could be a need for additional workup and/or imaging such as MRI, if the symptoms do not resolve. Patient advised that  if the back pain does not resolve, or radiates, this could progress to more serious conditions and is encouraged to follow-up with PCP or orthopedics within 2 weeks.     Final Clinical Impressions(s) / ED Diagnoses   Final diagnoses:  Sciatica of left side    New Prescriptions New Prescriptions   CYCLOBENZAPRINE (FLEXERIL) 10 MG TABLET    Take 1 tablet (10 mg total) by mouth 2 (two) times daily as needed for muscle spasms.   HYDROCODONE-ACETAMINOPHEN (NORCO/VICODIN) 5-325 MG TABLET    Take 1-2 tablets by mouth every 6 (six) hours as needed.     Montine Circle, PA-C 05/18/16 1513    Charlesetta Shanks, MD 05/21/16 1226

## 2016-05-18 NOTE — ED Notes (Signed)
Patient being transported to Radiology Department at this time

## 2016-05-18 NOTE — Progress Notes (Signed)
*  PRELIMINARY RESULTS* Vascular Ultrasound Left lower extremity venous duplex has been completed.  Preliminary findings: No evidence of DVT or baker's cyst.  Landry Mellow, RDMS, RVT   05/18/2016, 2:10 PM

## 2016-05-18 NOTE — ED Notes (Signed)
Patient placed on continuous pulse oximetry and blood pressure cuff

## 2016-05-18 NOTE — ED Notes (Signed)
Pt to ultrasound

## 2016-05-18 NOTE — ED Triage Notes (Signed)
Pt reports pain behind left knee x 4-5 days. Now has pain radiating up the back of her leg and into hip/lower back area ambulatory at triage. Denies swelling to leg.

## 2016-08-22 ENCOUNTER — Encounter (HOSPITAL_COMMUNITY): Payer: Self-pay | Admitting: Emergency Medicine

## 2016-08-22 ENCOUNTER — Emergency Department (HOSPITAL_COMMUNITY)
Admission: EM | Admit: 2016-08-22 | Discharge: 2016-08-22 | Disposition: A | Discharge status: Home / Self Care | Payer: BLUE CROSS/BLUE SHIELD | Attending: Physician Assistant | Admitting: Physician Assistant

## 2016-08-22 ENCOUNTER — Emergency Department (HOSPITAL_COMMUNITY): Payer: BLUE CROSS/BLUE SHIELD

## 2016-08-22 DIAGNOSIS — Z79899 Other long term (current) drug therapy: Secondary | ICD-10-CM | POA: Insufficient documentation

## 2016-08-22 DIAGNOSIS — I10 Essential (primary) hypertension: Secondary | ICD-10-CM | POA: Diagnosis not present

## 2016-08-22 DIAGNOSIS — Z794 Long term (current) use of insulin: Secondary | ICD-10-CM | POA: Diagnosis not present

## 2016-08-22 DIAGNOSIS — R509 Fever, unspecified: Secondary | ICD-10-CM | POA: Insufficient documentation

## 2016-08-22 DIAGNOSIS — E119 Type 2 diabetes mellitus without complications: Secondary | ICD-10-CM | POA: Insufficient documentation

## 2016-08-22 DIAGNOSIS — R52 Pain, unspecified: Secondary | ICD-10-CM | POA: Insufficient documentation

## 2016-08-22 DIAGNOSIS — R6889 Other general symptoms and signs: Secondary | ICD-10-CM

## 2016-08-22 LAB — BASIC METABOLIC PANEL
Anion gap: 12 (ref 5–15)
BUN: 15 mg/dL (ref 6–20)
CHLORIDE: 102 mmol/L (ref 101–111)
CO2: 21 mmol/L — AB (ref 22–32)
CREATININE: 0.9 mg/dL (ref 0.44–1.00)
Calcium: 9.2 mg/dL (ref 8.9–10.3)
GFR calc non Af Amer: >60 mL/min (ref >60)
Glucose, Bld: 402 mg/dL — ABNORMAL HIGH (ref 65–99)
Potassium: 4.4 mmol/L (ref 3.5–5.1)
Sodium: 135 mmol/L (ref 135–145)

## 2016-08-22 LAB — CBC
HEMATOCRIT: 34.8 % — AB (ref 36.0–46.0)
HEMOGLOBIN: 11.8 g/dL — AB (ref 12.0–15.0)
MCH: 27.1 pg (ref 26.0–34.0)
MCHC: 33.9 g/dL (ref 30.0–36.0)
MCV: 79.8 fL (ref 78.0–100.0)
PLATELETS: 336 10*3/uL (ref 150–400)
RBC: 4.36 MIL/uL (ref 3.87–5.11)
RDW: 13.8 % (ref 11.5–15.5)
WBC: 8.9 10*3/uL (ref 4.0–10.5)

## 2016-08-22 LAB — URINALYSIS, ROUTINE W REFLEX MICROSCOPIC
Bilirubin Urine: NEGATIVE
Glucose, UA: >=500 mg/dL — AB
Ketones, ur: NEGATIVE mg/dL
LEUKOCYTES UA: NEGATIVE
NITRITE: NEGATIVE
PH: 5.5 (ref 5.0–8.0)
Protein, ur: 100 mg/dL — AB
SPECIFIC GRAVITY, URINE: 1.025 (ref 1.005–1.030)

## 2016-08-22 LAB — URINALYSIS, MICROSCOPIC (REFLEX)

## 2016-08-22 LAB — I-STAT BETA HCG BLOOD, ED (MC, WL, AP ONLY)

## 2016-08-22 LAB — CBG MONITORING, ED: Glucose-Capillary: 375 mg/dL — ABNORMAL HIGH (ref 65–99)

## 2016-08-22 LAB — I-STAT TROPONIN, ED: Troponin i, poc: 0 ng/mL (ref 0.00–0.08)

## 2016-08-22 MED ORDER — SODIUM CHLORIDE 0.9 % IV BOLUS (SEPSIS)
1000.0000 mL | Freq: Once | INTRAVENOUS | Status: AC
Start: 2016-08-22 — End: 2016-08-22
  Administered 2016-08-22: 1000 mL via INTRAVENOUS

## 2016-08-22 MED ORDER — ACETAMINOPHEN 500 MG PO TABS: 500.0000 mg | ORAL_TABLET | Freq: Four times a day (QID) | ORAL | 0 refills | Status: DC | PRN

## 2016-08-22 MED ORDER — OXYCODONE-ACETAMINOPHEN 5-325 MG PO TABS
1.0000 | ORAL_TABLET | Freq: Once | ORAL | Status: AC
  Administered 2016-08-22: 1 via ORAL
  Filled 2016-08-22: qty 1

## 2016-08-22 MED ORDER — IBUPROFEN 800 MG PO TABS: 800.0000 mg | ORAL_TABLET | Freq: Three times a day (TID) | ORAL | 0 refills | Status: DC

## 2016-08-22 MED ORDER — IBUPROFEN 800 MG PO TABS
800.0000 mg | ORAL_TABLET | Freq: Once | ORAL | Status: AC
  Administered 2016-08-22: 800 mg via ORAL
  Filled 2016-08-22: qty 1

## 2016-08-22 NOTE — Discharge Instructions (Signed)
You were seen today with likely flu. We want you to take care of herself, drink plenty of fluids, use ibuprofen and Tylenol. Please return if he get worse rather than better.

## 2016-08-22 NOTE — ED Provider Notes (Addendum)
[name] DEPT Provider Note   CSN: [id number] Arrival date & time: 08/22/16  1107   By signing my name below, I, Neta Mends, attest that this documentation has been prepared under the direction and in the presence of Christelle Igoe Julio Alm, MD . Electronically Signed: Neta Mends, ED Scribe. 08/22/2016. 1:06 PM.   History   Chief Complaint Chief Complaint  Patient presents with  . Chest Pain  . Shortness of Breath  . Headache    The history is provided by the patient. No language interpreter was used.   HPI Comments:  Jacqueline [name] is a 36 y.o. female who presents to the Emergency Department complaining of constant fever x 3 days. Pt reports a fever of 103 this morning. Pt complains of associated generalize body aches, neck pain, headache, photophobia, chest pain, and SOB. Pt describes the chest pain as both sharp and dull. Pt took ibuprofen this morning with mild relief for fever. Pt denies other associated symptoms.   Past Medical History:  Diagnosis Date  . Anemia   . Anxiety   . Chronic bronchitis ([place])   . Hypertension   . Morbid obesity ([place])   . Type II diabetes mellitus [facility])     Patient Active Problem List   Diagnosis Date Noted  . Staphylococcus aureus bacteremia 03/15/2016  . Streptococcal bacteremia 03/15/2016  . Tachycardia 03/14/2016  . Fever 03/14/2016  . Headache 03/14/2016  . Morbid obesity ([place])   . Anxiety   . Chest pain at rest   . HTN (hypertension) 12/22/2015  . Diabetes ([place]) 01/01/2014    Past Surgical History:  Procedure Laterality Date  . CARDIAC CATHETERIZATION  01/06/2016  . CARDIAC CATHETERIZATION N/A 01/06/2016   Procedure: Left Heart Cath and Coronary Angiography;  Surgeon: Wellington Hampshire, MD;  Location: [facility];  Service: Cardiovascular;  Laterality: N/A;  . CESAREAN SECTION  08/2014    OB History    Gravida Para Term Preterm AB Living   1             SAB TAB Ectopic Multiple Live Births         Home Medications    Prior to Admission medications   Medication Sig Start Date End Date Taking? Authorizing Provider  acetaminophen (TYLENOL) 325 MG tablet Take 2 tablets (650 mg total) by mouth every 6 (six) hours as needed for mild pain (or Fever >/= 101). Patient not taking: Reported on 05/18/2016 03/17/16   Kelvin Cellar, MD  ALPRAZolam Duanne Moron) 0.5 MG tablet Take 0.5 mg by mouth daily as needed for anxiety. 02/19/16   Historical Provider, MD  cyclobenzaprine (FLEXERIL) 10 MG tablet Take 1 tablet (10 mg total) by mouth 2 (two) times daily as needed for muscle spasms. 05/18/16   Montine Circle, PA-C  docusate sodium (COLACE) 100 MG capsule Take 1 capsule (100 mg total) by mouth 2 (two) times daily. Patient not taking: Reported on 05/18/2016 03/17/16   Kelvin Cellar, MD  glipiZIDE (GLUCOTROL) 10 MG tablet Take 10 mg by mouth daily.    Historical Provider, MD  HYDROcodone-acetaminophen (NORCO/VICODIN) 5-325 MG tablet Take 1-2 tablets by mouth every 6 (six) hours as needed. 05/18/16   Montine Circle, PA-C  Insulin Glargine (BASAGLAR KWIKPEN) 100 UNIT/ML SOPN Inject 48 Units into the skin at bedtime.     Historical Provider, MD  Iron-FA-B Cmp-C-Biot-Probiotic (FUSION PLUS) CAPS Take 1 capsule by mouth daily.    Historical Provider, MD  losartan (COZAAR) 100 MG tablet Take 1  tablet (100 mg total) by mouth daily. 12/23/15   Ripudeep Krystal Eaton, MD  metFORMIN (GLUCOPHAGE) 1000 MG tablet Take 1,500 mg by mouth daily with breakfast.     Historical Provider, MD  pantoprazole (PROTONIX) 40 MG tablet Take 1 tablet (40 mg total) by mouth daily. 12/23/15   Ripudeep Krystal Eaton, MD  polyethylene glycol (MIRALAX / GLYCOLAX) packet Take 17 g by mouth daily. Patient not taking: Reported on 05/18/2016 03/17/16   Kelvin Cellar, MD  sertraline (ZOLOFT) 50 MG tablet Take 50 mg by mouth daily. 02/15/16   Historical Provider, MD    Family History Family History  Problem Relation Age of Onset  . Diabetes Mother   .  Hypertension Mother   . Thyroid disease Mother   . Kidney disease Maternal Grandmother   . Diabetes Maternal Grandmother   . Heart attack      Social History Social History  Substance Use Topics  . Smoking status: Never Smoker  . Smokeless tobacco: Never Used  . Alcohol use No     Allergies   Patient has no known allergies.   Review of Systems Review of Systems  Constitutional: Positive for fever.  Eyes: Positive for photophobia.  Respiratory: Positive for shortness of breath.   Cardiovascular: Positive for chest pain.  Musculoskeletal: Positive for myalgias and neck pain.  Neurological: Positive for headaches.  All other systems reviewed and are negative.    Physical Exam Updated Vital Signs BP 153/92 (BP Location: Right Arm)   Pulse 108   Temp 99.3 F (37.4 C) (Oral)   Resp 18   Ht 5\' 4"  (1.626 m)   Wt 285 lb (129.3 kg)   LMP 08/14/2016   SpO2 100%   BMI 48.92 kg/m   Physical Exam  Constitutional: She appears well-developed and well-nourished. No distress.  HENT:  Head: Normocephalic and atraumatic.  Mouth/Throat: Mucous membranes are dry.  Eyes: Conjunctivae are normal.  Cardiovascular: Regular rhythm.   Tachycardia  Pulmonary/Chest: Effort normal.  Abdominal: She exhibits no distension.  Neurological: She is alert.  Skin: Skin is warm and dry.  Psychiatric: She has a normal mood and affect.  Nursing note and vitals reviewed.    ED Treatments / Results  DIAGNOSTIC STUDIES:  Oxygen Saturation is 100% on RA, normal by my interpretation.    COORDINATION OF CARE:  1:06 PM Will order IV fluids. Discussed treatment plan with pt at bedside and pt agreed to plan.   Labs (all labs ordered are listed, but only abnormal results are displayed) Labs Reviewed  BASIC METABOLIC PANEL - Abnormal; Notable for the following:       Result Value   CO2 21 (*)    Glucose, Bld 402 (*)    All other components within normal limits  CBC - Abnormal; Notable for  the following:    Hemoglobin 11.8 (*)    HCT 34.8 (*)    All other components within normal limits  URINALYSIS, ROUTINE W REFLEX MICROSCOPIC - Abnormal; Notable for the following:    Glucose, UA >=500 (*)    Hgb urine dipstick MODERATE (*)    Protein, ur 100 (*)    All other components within normal limits  URINALYSIS, MICROSCOPIC (REFLEX) - Abnormal; Notable for the following:    Bacteria, UA RARE (*)    Squamous Epithelial / LPF 0-5 (*)    All other components within normal limits  CBG MONITORING, ED - Abnormal; Notable for the following:    Glucose-Capillary 375 (*)  All other components within normal limits  I-STAT TROPOININ, ED  I-STAT BETA HCG BLOOD, ED ([facility], [facility], [facility] ONLY)    EKG  EKG Interpretation None       Radiology Dg Chest 2 View  Result Date: 08/22/2016 CLINICAL DATA:  Right-sided chest pain since last night with fever to 103 degrees. History of chronic bronchitis, hypertension, and diabetes. Morbid obesity. EXAM: CHEST  2 VIEW COMPARISON:  PA and lateral chest x-ray of May 18, 2016 FINDINGS: The lungs are borderline hypoinflated but this is chronic. There is no alveolar infiltrate or pleural effusion. There is no pneumothorax or pneumomediastinum. The heart and pulmonary vascularity are normal. The mediastinum is normal in width. There is mild multilevel degenerative disc disease of the lower thoracic spine which is stable. IMPRESSION: Mild chronic bronchitic changes, stable. There is no acute cardiopulmonary abnormality. Electronically Signed   By: David  Martinique M.D.   On: 08/22/2016 12:08    Procedures Procedures (including critical care time)  Medications Ordered in ED Medications - No data to display   Initial Impression / Assessment and Plan / ED Course  I have reviewed the triage vital signs and the nursing notes.  Pertinent labs & imaging results that were available during my care of the patient were reviewed by me and considered in my medical  decision making (see chart for details).     I personally performed the services described in this documentation, which was scribed in my presence. The recorded information has been reviewed and is accurate.   Patient is a 45 old female presenting with flulike symptoms. Patient has body aches, headache, feelings of fatigue and fever. We will treat her with IV fluids, ibuprofen Tylenol. Patient is eating and drinking normally. I suspect that patient has the  flu. Since it has been 24 hours since the beginning of the symptoms and patient does not require hospitalization and is not high risk category, will not offer Tamiflu at this time. Although we did discuss the risks and benefits of the medication and return precuations.   Patient is comfortable, ambulatory, and taking PO at time of discharge.  Patient expressed understanding about return precautions.    Final Clinical Impressions(s) / ED Diagnoses   Final diagnoses:  None    New Prescriptions New Prescriptions   No medications on file      Elda Dunkerson Julio Alm, MD 08/22/16 [place], MD 08/22/16 1454

## 2016-08-22 NOTE — ED Notes (Signed)
Got patient ready for discharge 

## 2016-08-22 NOTE — ED Triage Notes (Signed)
Pt from home with c/o neck and back pain with headache, CP, and SOB for several days.  Pt reports fever of 103 yesterday.  Tearful in triage, lungs clear/diminished.  NAD, A&O.

## 2017-01-31 ENCOUNTER — Encounter (HOSPITAL_BASED_OUTPATIENT_CLINIC_OR_DEPARTMENT_OTHER): Payer: Self-pay | Admitting: Emergency Medicine

## 2017-01-31 ENCOUNTER — Emergency Department (HOSPITAL_BASED_OUTPATIENT_CLINIC_OR_DEPARTMENT_OTHER): Payer: BLUE CROSS/BLUE SHIELD

## 2017-01-31 ENCOUNTER — Emergency Department (HOSPITAL_BASED_OUTPATIENT_CLINIC_OR_DEPARTMENT_OTHER)
Admission: EM | Admit: 2017-01-31 | Discharge: 2017-01-31 | Disposition: A | Discharge status: Home / Self Care | Payer: BLUE CROSS/BLUE SHIELD | Attending: Emergency Medicine | Admitting: Emergency Medicine

## 2017-01-31 DIAGNOSIS — R079 Chest pain, unspecified: Secondary | ICD-10-CM | POA: Diagnosis present

## 2017-01-31 DIAGNOSIS — I10 Essential (primary) hypertension: Secondary | ICD-10-CM

## 2017-01-31 DIAGNOSIS — Z79899 Other long term (current) drug therapy: Secondary | ICD-10-CM | POA: Insufficient documentation

## 2017-01-31 DIAGNOSIS — E1165 Type 2 diabetes mellitus with hyperglycemia: Secondary | ICD-10-CM | POA: Diagnosis not present

## 2017-01-31 DIAGNOSIS — N179 Acute kidney failure, unspecified: Secondary | ICD-10-CM | POA: Diagnosis not present

## 2017-01-31 DIAGNOSIS — R739 Hyperglycemia, unspecified: Secondary | ICD-10-CM

## 2017-01-31 DIAGNOSIS — Z794 Long term (current) use of insulin: Secondary | ICD-10-CM | POA: Insufficient documentation

## 2017-01-31 DIAGNOSIS — M7989 Other specified soft tissue disorders: Secondary | ICD-10-CM

## 2017-01-31 LAB — COMPREHENSIVE METABOLIC PANEL
ALK PHOS: 86 U/L (ref 38–126)
ALT: 16 U/L (ref 14–54)
AST: 20 U/L (ref 15–41)
Albumin: 3.3 g/dL — ABNORMAL LOW (ref 3.5–5.0)
Anion gap: 12 (ref 5–15)
BUN: 25 mg/dL — AB (ref 6–20)
CALCIUM: 8.9 mg/dL (ref 8.9–10.3)
CO2: 22 mmol/L (ref 22–32)
CREATININE: 1.28 mg/dL — AB (ref 0.44–1.00)
Chloride: 95 mmol/L — ABNORMAL LOW (ref 101–111)
GFR calc non Af Amer: 53 mL/min — ABNORMAL LOW (ref >60)
GLUCOSE: 593 mg/dL — AB (ref 65–99)
Potassium: 4.2 mmol/L (ref 3.5–5.1)
SODIUM: 129 mmol/L — AB (ref 135–145)
Total Bilirubin: 0.5 mg/dL (ref 0.3–1.2)
Total Protein: 7.5 g/dL (ref 6.5–8.1)

## 2017-01-31 LAB — CBC
HEMATOCRIT: 31.5 % — AB (ref 36.0–46.0)
HEMOGLOBIN: 11.2 g/dL — AB (ref 12.0–15.0)
MCH: 28.7 pg (ref 26.0–34.0)
MCHC: 35.6 g/dL (ref 30.0–36.0)
MCV: 80.8 fL (ref 78.0–100.0)
Platelets: 344 10*3/uL (ref 150–400)
RBC: 3.9 MIL/uL (ref 3.87–5.11)
RDW: 12.7 % (ref 11.5–15.5)
WBC: 8.8 10*3/uL (ref 4.0–10.5)

## 2017-01-31 LAB — URINALYSIS, ROUTINE W REFLEX MICROSCOPIC
BILIRUBIN URINE: NEGATIVE
Ketones, ur: NEGATIVE mg/dL
Leukocytes, UA: NEGATIVE
Nitrite: NEGATIVE
PH: 6 (ref 5.0–8.0)
Protein, ur: NEGATIVE mg/dL
Specific Gravity, Urine: 1.022 (ref 1.005–1.030)

## 2017-01-31 LAB — TROPONIN I: Troponin I: <0.03 ng/mL (ref <0.03)

## 2017-01-31 LAB — D-DIMER, QUANTITATIVE: D-Dimer, Quant: 0.33 ug/mL-FEU (ref 0.00–0.50)

## 2017-01-31 LAB — I-STAT VENOUS BLOOD GAS, ED
Acid-base deficit: 2 mmol/L (ref 0.0–2.0)
BICARBONATE: 21.6 mmol/L (ref 20.0–28.0)
O2 Saturation: 69 %
PO2 VEN: 35 mmHg (ref 32.0–45.0)
TCO2: 23 mmol/L (ref 0–100)
pCO2, Ven: 33.4 mmHg — ABNORMAL LOW (ref 44.0–60.0)
pH, Ven: 7.42 (ref 7.250–7.430)

## 2017-01-31 LAB — URINALYSIS, MICROSCOPIC (REFLEX): WBC, UA: NONE SEEN WBC/hpf (ref 0–5)

## 2017-01-31 LAB — PREGNANCY, URINE: PREG TEST UR: NEGATIVE

## 2017-01-31 LAB — CBG MONITORING, ED
GLUCOSE-CAPILLARY: 560 mg/dL — AB (ref 65–99)
Glucose-Capillary: 439 mg/dL — ABNORMAL HIGH (ref 65–99)

## 2017-01-31 MED ORDER — SODIUM CHLORIDE 0.9 % IV BOLUS (SEPSIS)
1000.0000 mL | Freq: Once | INTRAVENOUS | Status: AC
  Administered 2017-01-31: 1000 mL via INTRAVENOUS

## 2017-01-31 MED ORDER — KETOROLAC TROMETHAMINE 30 MG/ML IJ SOLN
15.0000 mg | Freq: Once | INTRAMUSCULAR | Status: AC
  Administered 2017-01-31: 15 mg via INTRAVENOUS
  Filled 2017-01-31: qty 1

## 2017-01-31 MED ORDER — INSULIN ASPART 100 UNIT/ML ~~LOC~~ SOLN: 100.0000 [IU] | Freq: Once | SUBCUTANEOUS | Status: DC

## 2017-01-31 MED ORDER — MORPHINE SULFATE (PF) 2 MG/ML IV SOLN
2.0000 mg | Freq: Once | INTRAVENOUS | Status: AC
  Administered 2017-01-31: 2 mg via INTRAVENOUS
  Filled 2017-01-31: qty 1

## 2017-01-31 MED ORDER — ASPIRIN 81 MG PO CHEW
324.0000 mg | CHEWABLE_TABLET | Freq: Once | ORAL | Status: AC
  Administered 2017-01-31: 324 mg via ORAL

## 2017-01-31 MED ORDER — SODIUM CHLORIDE 0.9 % IV SOLN
1000.0000 mL | INTRAVENOUS | Status: DC
  Administered 2017-01-31: 1000 mL via INTRAVENOUS

## 2017-01-31 MED ORDER — ASPIRIN EC 81 MG PO TBEC: 81.0000 mg | DELAYED_RELEASE_TABLET | Freq: Once | ORAL | Status: DC

## 2017-01-31 MED ORDER — ASPIRIN 81 MG PO CHEW
CHEWABLE_TABLET | ORAL | Status: AC
  Administered 2017-01-31: 324 mg via ORAL
  Filled 2017-01-31: qty 4

## 2017-01-31 NOTE — ED Notes (Signed)
Blood culture x1 obtained and held at this time.

## 2017-01-31 NOTE — ED Notes (Signed)
Pt reports sudden onset of CP while sitting. Pain to the L part of her chest with ShOB, diaphoresis (skin dry during assessment), nausea, and lightheadedness. Pt also c/o swelling to R leg with calf pain.   Pt's CBG noted to be high. Pt has had diabetic medication adjusted 3 weeks prior by her PCP.

## 2017-01-31 NOTE — Discharge Instructions (Signed)
Take your at home medicines as prescribed. It is very important that you follow-up with your primary care for further management of your blood pressure and blood sugar. Stay well-hydrated. Return to the emergency department if you develop fever, chills, weakness, new neurologic symptoms, chest pain, shortness of breath, any new or worsening symptoms.

## 2017-01-31 NOTE — ED Notes (Signed)
Patient transported to X-ray 

## 2017-01-31 NOTE — ED Triage Notes (Signed)
Chest pain With SOB x 1 hour while sitting at her desk at work, also states she is diabetic and had a "high" reading on her glucometer just PTA.

## 2017-01-31 NOTE — ED Provider Notes (Signed)
[place] DEPT [facility] Provider Note   CSN: [id number] Arrival date & time: 01/31/17  1548  By signing my name below, I, Dora Sims, attest that this documentation has been prepared under the direction and in the presence of Markiya Keefe, PA-C. Electronically Signed: Dora Sims, Scribe. 01/31/2017. 4:17 PM.  History   Chief Complaint Chief Complaint  Patient presents with  . Hyperglycemia  . Chest Pain   The history is provided by the patient. No language interpreter was used.    HPI Comments: Jacqueline [name] is a 36 y.o. female with PMHx inclucing HTN, DM2, and chronic bronchitis who presents to the Emergency Department complaining of acute onset, persistent central chest pain beginning about one hour ago. She reports some associated chest tightness and dyspnea. Patient states her symptoms presented while sitting at work and notes that her CP was initially intermittent, but has become constant. Her chest pain is worse with deep inhalation, applied pressure, and activity. No alleviating factors noted and no medications or treatments tried PTA. She checked her blood glucose shortly after presentation of her chest pain and it was >600. She checks her blood glucose daily with typical measurements in the 200's. Patient's NovoLog dose was recently increased to x3 daily and she has been compliant. She had a similar episode of chest pain in June of 2017 and subsequently had a cardiac catheterization which was unremarkable. Over the last few days she endorses intermittent fevers, headaches, dizziness, blurry vision, and nausea without vomiting. She notes some recent increased thirst and a frequent dry mouth. She additionally reports some bilateral leg pain R > L for a few weeks with intermittent, worsening lower leg swelling for a few days. She has had leg swelling in the past but notes her current onset is worse than previously. Patient states her headaches, blurry vision, and dizziness are  consistent with poorly controlled blood pressure; however, she is compliant with her daily Benicar which she started three months ago. She has a reported FMHx of HTN, MI, and CHF. Patient is a non-smoker and does not drink alcohol. No illicit drug use. No hormone therapy. She denies chills, vomiting, abdominal pain, hematuria, urinary frequency, dysuria, cough, or any other associated symptoms. No cardiologist.   Past Medical History:  Diagnosis Date  . Anemia   . Anxiety   . Chronic bronchitis ([place])   . Hypertension   . Morbid obesity ([place])   . Type II diabetes mellitus [facility])     Patient Active Problem List   Diagnosis Date Noted  . Staphylococcus aureus bacteremia 03/15/2016  . Streptococcal bacteremia 03/15/2016  . Tachycardia 03/14/2016  . Fever 03/14/2016  . Headache 03/14/2016  . Morbid obesity ([place])   . Anxiety   . Chest pain at rest   . HTN (hypertension) 12/22/2015  . Diabetes ([place]) 01/01/2014    Past Surgical History:  Procedure Laterality Date  . CARDIAC CATHETERIZATION  01/06/2016  . CARDIAC CATHETERIZATION N/A 01/06/2016   Procedure: Left Heart Cath and Coronary Angiography;  Surgeon: Wellington Hampshire, MD;  Location: [place] CV LAB;  Service: Cardiovascular;  Laterality: N/A;  . CESAREAN SECTION  08/2014    OB History    Gravida Para Term Preterm AB Living   1             SAB TAB Ectopic Multiple Live Births                   Home Medications    Prior to Admission medications  Medication Sig Start Date End Date Taking? Authorizing Provider  insulin aspart (NOVOLOG) 100 UNIT/ML injection Inject into the skin 3 (three) times daily before meals.   Yes [provider]  olmesartan (BENICAR) 20 MG tablet Take 20 mg by mouth daily.   Yes [provider]  acetaminophen (TYLENOL) 325 MG tablet Take 2 tablets (650 mg total) by mouth every 6 (six) hours as needed for mild pain (or Fever >/= 101). Patient not taking: Reported on 05/18/2016 03/17/16    Kelvin Cellar, MD  acetaminophen (TYLENOL) 500 MG tablet Take 1 tablet (500 mg total) by mouth every 6 (six) hours as needed. 08/22/16   Mackuen, Courteney Lyn, MD  ALPRAZolam (XANAX) 0.5 MG tablet Take 0.5 mg by mouth daily as needed for anxiety. 02/19/16   [provider]  cyclobenzaprine (FLEXERIL) 10 MG tablet Take 1 tablet (10 mg total) by mouth 2 (two) times daily as needed for muscle spasms. 05/18/16   Montine Circle, PA-C  docusate sodium (COLACE) 100 MG capsule Take 1 capsule (100 mg total) by mouth 2 (two) times daily. Patient not taking: Reported on 05/18/2016 03/17/16   Kelvin Cellar, MD  glipiZIDE (GLUCOTROL) 10 MG tablet Take 10 mg by mouth daily.    [provider]  HYDROcodone-acetaminophen (NORCO/VICODIN) 5-325 MG tablet Take 1-2 tablets by mouth every 6 (six) hours as needed. 05/18/16   Montine Circle, PA-C  ibuprofen (ADVIL,MOTRIN) 800 MG tablet Take 1 tablet (800 mg total) by mouth 3 (three) times daily. 08/22/16   Mackuen, Courteney Lyn, MD  Insulin Glargine (BASAGLAR KWIKPEN) 100 UNIT/ML SOPN Inject 48 Units into the skin at bedtime.     [provider]  Iron-FA-B Cmp-C-Biot-Probiotic (FUSION PLUS) CAPS Take 1 capsule by mouth daily.    [provider]  losartan (COZAAR) 100 MG tablet Take 1 tablet (100 mg total) by mouth daily. 12/23/15   Rai, Ripudeep Raliegh Ip, MD  metFORMIN (GLUCOPHAGE) 1000 MG tablet Take 1,500 mg by mouth daily with breakfast.     [provider]  pantoprazole (PROTONIX) 40 MG tablet Take 1 tablet (40 mg total) by mouth daily. 12/23/15   Rai, Vernelle Emerald, MD  polyethylene glycol (MIRALAX / GLYCOLAX) packet Take 17 g by mouth daily. Patient not taking: Reported on 05/18/2016 03/17/16   Kelvin Cellar, MD  sertraline (ZOLOFT) 50 MG tablet Take 50 mg by mouth daily. 02/15/16   [provider]    Family History Family History  Problem Relation Age of Onset  . Diabetes Mother   . Hypertension Mother   . Thyroid  disease Mother   . Kidney disease Maternal Grandmother   . Diabetes Maternal Grandmother   . Heart attack Unknown     Social History Social History  Substance Use Topics  . Smoking status: Never Smoker  . Smokeless tobacco: Never Used  . Alcohol use No     Allergies   Patient has no known allergies.   Review of Systems Review of Systems  Constitutional: Positive for chills and fever.  Eyes: Positive for visual disturbance.  Respiratory: Positive for chest tightness and shortness of breath. Negative for cough.   Cardiovascular: Positive for chest pain and leg swelling.  Gastrointestinal: Positive for nausea. Negative for vomiting.  Endocrine: Positive for polydipsia.  Genitourinary: Negative for dysuria, frequency and hematuria.  Musculoskeletal: Positive for myalgias.  Neurological: Positive for dizziness and headaches.  All other systems reviewed and are negative.  Physical Exam Updated Vital Signs BP (!) 146/89   Pulse Marland Kitchen)  111   Temp 98.7 F (37.1 C) (Rectal)   Resp 18   Ht 5\' 4"  (1.626 m)   Wt 124.7 kg (275 lb)   LMP 01/30/2017   SpO2 99%   BMI 47.20 kg/m   Vitals:   01/31/17 1900 01/31/17 1901 01/31/17 1930 01/31/17 2000  BP: (!) 155/91 (!) 155/91 (!) 149/87 (!) 146/89  Pulse: (!) 110 (!) 111 (!) 109 (!) 111  Resp: 16 18 18 18   Temp:      TempSrc:      SpO2: 100% 100% 100% 99%  Weight:      Height:         Physical Exam  Constitutional: She is oriented to person, place, and time. She appears well-developed and well-nourished. No distress.  HENT:  Head: Normocephalic and atraumatic.  Eyes: Pupils are equal, round, and reactive to light. Conjunctivae are normal.  Neck: Normal range of motion. No tracheal deviation present.  Cardiovascular: Regular rhythm.  Tachycardia present.   Bilateral lower leg swelling. No pitting edema. Increased tenderness of the right calf. Pulses intact bilaterally. Color and warmth equal bilaterally. Compartments are soft.    Pulmonary/Chest: Effort normal and breath sounds normal. No respiratory distress.  Abdominal: Soft. She exhibits no distension. There is no tenderness. There is no guarding.  Musculoskeletal: Normal range of motion.  Lymphadenopathy:    She has no cervical adenopathy.  Neurological: She is alert and oriented to person, place, and time.  Skin: Skin is warm and dry. She is not diaphoretic.  Psychiatric: She has a normal mood and affect. Her behavior is normal.  Nursing note and vitals reviewed.  ED Treatments / Results  Labs (all labs ordered are listed, but only abnormal results are displayed) Labs Reviewed  CBC - Abnormal; Notable for the following:       Result Value   Hemoglobin 11.2 (*)    HCT 31.5 (*)    All other components within normal limits  URINALYSIS, ROUTINE W REFLEX MICROSCOPIC - Abnormal; Notable for the following:    Glucose, UA >=500 (*)    Hgb urine dipstick LARGE (*)    All other components within normal limits  COMPREHENSIVE METABOLIC PANEL - Abnormal; Notable for the following:    Sodium 129 (*)    Chloride 95 (*)    Glucose, Bld 593 (*)    BUN 25 (*)    Creatinine, Ser 1.28 (*)    Albumin 3.3 (*)    GFR calc non Af Amer 53 (*)    All other components within normal limits  URINALYSIS, MICROSCOPIC (REFLEX) - Abnormal; Notable for the following:    Bacteria, UA RARE (*)    Squamous Epithelial / LPF 0-5 (*)    All other components within normal limits  CBG MONITORING, ED - Abnormal; Notable for the following:    Glucose-Capillary 560 (*)    All other components within normal limits  CBG MONITORING, ED - Abnormal; Notable for the following:    Glucose-Capillary 439 (*)    All other components within normal limits  I-STAT VENOUS BLOOD GAS, ED - Abnormal; Notable for the following:    pCO2, Ven 33.4 (*)    All other components within normal limits  TROPONIN I  PREGNANCY, URINE  D-DIMER, QUANTITATIVE (NOT AT [facility])  TROPONIN I  I-STAT CG4 LACTIC ACID, ED     EKG  EKG Interpretation  Date/Time:  Tuesday January 31 2017 19:20:30 EDT Ventricular Rate:  112 PR Interval:  QRS Duration: 90 QT Interval:  331 QTC Calculation: 452 R Axis:   89 Text Interpretation:  Sinus tachycardia no significant change compared to earlier in the day Confirmed by Sherwood Gambler [phone]) on 01/31/2017 7:55:39 PM       Radiology Dg Chest 2 View  Result Date: 01/31/2017 CLINICAL DATA:  Chest pain with shortness of breath for 1 hour EXAM: CHEST  2 VIEW COMPARISON:  08/22/2016 FINDINGS: Normal heart size and mediastinal contours. Low volume chest with interstitial crowding. There is no edema, consolidation, effusion, or pneumothorax. Spondylosis with prominent spurring. IMPRESSION: Stable from prior.  No evidence of acute disease. Electronically Signed   By: Monte Fantasia M.D.   On: 01/31/2017 17:10   US Venous Img Lower Unilateral Right  Result Date: 01/31/2017 CLINICAL DATA:  Right lower extremity pain and swelling for 2 weeks EXAM: RIGHT LOWER EXTREMITY VENOUS DOPPLER ULTRASOUND TECHNIQUE: Gray-scale sonography with graded compression, as well as color Doppler and duplex ultrasound were performed to evaluate the lower extremity deep venous systems from the level of the common femoral vein and including the common femoral, femoral, profunda femoral, popliteal and calf veins including the posterior tibial, peroneal and gastrocnemius veins when visible. The superficial great saphenous vein was also interrogated. Spectral Doppler was utilized to evaluate flow at rest and with distal augmentation maneuvers in the common femoral, femoral and popliteal veins. COMPARISON:  None. FINDINGS: Contralateral Common Femoral Vein: Respiratory phasicity is normal and symmetric with the symptomatic side. No evidence of thrombus. Normal compressibility. Common Femoral Vein: No evidence of thrombus. Normal compressibility, respiratory phasicity and response to augmentation. Saphenofemoral  Junction: No evidence of thrombus. Normal compressibility and flow on color Doppler imaging. Profunda Femoral Vein: No evidence of thrombus. Normal compressibility and flow on color Doppler imaging. Femoral Vein: No evidence of thrombus. Normal compressibility, respiratory phasicity and response to augmentation. Popliteal Vein: No evidence of thrombus. Normal compressibility, respiratory phasicity and response to augmentation. Calf Veins: Limited visualization of the calf veins. No significant occlusive thrombus appreciated. Superficial Great Saphenous Vein: No evidence of thrombus. Normal compressibility and flow on color Doppler imaging. Venous Reflux:  None. Other Findings:  None. IMPRESSION: No evidence of DVT within the right lower extremity. Electronically Signed   By: Jerilynn Mages.  Shick M.D.   On: 01/31/2017 17:09    Procedures Procedures (including critical care time)  DIAGNOSTIC STUDIES: Oxygen Saturation is 100% on RA, normal by my interpretation.    COORDINATION OF CARE: 4:14 PM Discussed treatment plan with pt at bedside and pt agreed to plan.  Medications Ordered in ED Medications  sodium chloride 0.9 % bolus 1,000 mL (0 mLs Intravenous Stopped 01/31/17 1730)  morphine 2 MG/ML injection 2 mg (2 mg Intravenous Given 01/31/17 1712)  aspirin chewable tablet 324 mg (324 mg Oral Given 01/31/17 1711)  sodium chloride 0.9 % bolus 1,000 mL (0 mLs Intravenous Stopped 01/31/17 1857)  ketorolac (TORADOL) 30 MG/ML injection 15 mg (15 mg Intravenous Given 01/31/17 1934)     Initial Impression / Assessment and Plan / ED Course  I have reviewed the triage vital signs and the nursing notes.  Pertinent labs & imaging results that were available during my care of the patient were reviewed by me and considered in my medical decision making (see chart for details).     Patient presenting with chest pain described as a chest tightness, shortness of breath, and elevated blood sugar. Associated nausea,  polyuria, polydipsia, and right leg swelling. Vitals concerning with significant elevated  blood pressure, heart rate in the 130s. SpO2 in the upper 90s. At this time, will order EKG, troponin, and right lower leg DVT study to rule out PE or MI. Will draw labs for further evaluation of blood sugar to rule out DKA. Will start IV fluids and give medicine for pain.  CBG elevated at 593. Troponin, d-dimer, and DVT study negative. EKG reassuring. Slight decrease in kidney function since last seen. No increase in anion gap. UA without ketones. At this time, patient does not appear to be having MI, PE, or DKA. Blood sugar, heart rate, and blood pressure improved with IV fluids. Discussed case with attending, Dr. Regenia Skeeter evaluated the patient. On reassessment, patient reports she is feeling better. Will order repeat troponin.  Repeat troponin negative. At this time, patient's blood pressure and blood sugars have improved. Patient has good follow-up with primary care, and states that she can make an appointment with her primary care next several days for further management of her blood pressure and diabetes. Stressed importance of follow-up with and improved management. Discussed findings with patient. Return precautions given. Patient states she understands and agrees to plan.   Final Clinical Impressions(s) / ED Diagnoses   Final diagnoses:  Hyperglycemia  Hypertension, unspecified type  AKI (acute kidney injury) [facility])    New Prescriptions Discharge Medication List as of 01/31/2017  8:08 PM     I personally performed the services described in this documentation, which was scribed in my presence. The recorded information has been reviewed and is accurate.    Franchot Heidelberg, PA-C 02/01/17 [id number]    Sherwood Gambler, MD 02/01/17 1525

## 2017-01-31 NOTE — ED Notes (Signed)
ED Provider at bedside to give pt update.

## 2017-03-21 ENCOUNTER — Other Ambulatory Visit: Payer: Self-pay | Admitting: Plastic Surgery

## 2017-03-21 HISTORY — PX: REDUCTION MAMMAPLASTY: SUR839

## 2017-03-21 HISTORY — PX: BREAST REDUCTION SURGERY: SHX8

## 2017-04-30 ENCOUNTER — Emergency Department (HOSPITAL_BASED_OUTPATIENT_CLINIC_OR_DEPARTMENT_OTHER): Payer: BLUE CROSS/BLUE SHIELD

## 2017-04-30 ENCOUNTER — Encounter (HOSPITAL_BASED_OUTPATIENT_CLINIC_OR_DEPARTMENT_OTHER): Payer: Self-pay | Admitting: Emergency Medicine

## 2017-04-30 ENCOUNTER — Observation Stay (HOSPITAL_BASED_OUTPATIENT_CLINIC_OR_DEPARTMENT_OTHER)
Admission: EM | Admit: 2017-04-30 | Discharge: 2017-05-02 | Disposition: A | Discharge status: Home / Self Care | Payer: BLUE CROSS/BLUE SHIELD | Attending: Internal Medicine | Admitting: Internal Medicine

## 2017-04-30 DIAGNOSIS — D649 Anemia, unspecified: Secondary | ICD-10-CM | POA: Diagnosis present

## 2017-04-30 DIAGNOSIS — Z79899 Other long term (current) drug therapy: Secondary | ICD-10-CM | POA: Insufficient documentation

## 2017-04-30 DIAGNOSIS — F419 Anxiety disorder, unspecified: Secondary | ICD-10-CM | POA: Insufficient documentation

## 2017-04-30 DIAGNOSIS — R51 Headache: Principal | ICD-10-CM | POA: Insufficient documentation

## 2017-04-30 DIAGNOSIS — E1165 Type 2 diabetes mellitus with hyperglycemia: Secondary | ICD-10-CM | POA: Diagnosis not present

## 2017-04-30 DIAGNOSIS — Z791 Long term (current) use of non-steroidal anti-inflammatories (NSAID): Secondary | ICD-10-CM | POA: Diagnosis not present

## 2017-04-30 DIAGNOSIS — R509 Fever, unspecified: Secondary | ICD-10-CM | POA: Diagnosis present

## 2017-04-30 DIAGNOSIS — Z6841 Body Mass Index (BMI) 40.0 and over, adult: Secondary | ICD-10-CM | POA: Diagnosis not present

## 2017-04-30 DIAGNOSIS — Z794 Long term (current) use of insulin: Secondary | ICD-10-CM | POA: Diagnosis not present

## 2017-04-30 DIAGNOSIS — G44201 Tension-type headache, unspecified, intractable: Secondary | ICD-10-CM

## 2017-04-30 DIAGNOSIS — E1129 Type 2 diabetes mellitus with other diabetic kidney complication: Secondary | ICD-10-CM

## 2017-04-30 DIAGNOSIS — Z23 Encounter for immunization: Secondary | ICD-10-CM | POA: Insufficient documentation

## 2017-04-30 DIAGNOSIS — I10 Essential (primary) hypertension: Secondary | ICD-10-CM | POA: Insufficient documentation

## 2017-04-30 DIAGNOSIS — R112 Nausea with vomiting, unspecified: Secondary | ICD-10-CM

## 2017-04-30 DIAGNOSIS — R519 Headache, unspecified: Secondary | ICD-10-CM | POA: Diagnosis present

## 2017-04-30 DIAGNOSIS — E0800 Diabetes mellitus due to underlying condition with hyperosmolarity without nonketotic hyperglycemic-hyperosmolar coma (NKHHC): Secondary | ICD-10-CM

## 2017-04-30 DIAGNOSIS — M436 Torticollis: Secondary | ICD-10-CM

## 2017-04-30 LAB — CBC WITH DIFFERENTIAL/PLATELET
BASOS ABS: 0.1 10*3/uL (ref 0.0–0.1)
BASOS PCT: 1 %
Eosinophils Absolute: 0.3 10*3/uL (ref 0.0–0.7)
Eosinophils Relative: 4 %
HEMATOCRIT: 29.4 % — AB (ref 36.0–46.0)
HEMOGLOBIN: 9.8 g/dL — AB (ref 12.0–15.0)
LYMPHS PCT: 38 %
Lymphs Abs: 3.3 10*3/uL (ref 0.7–4.0)
MCH: 27.8 pg (ref 26.0–34.0)
MCHC: 33.3 g/dL (ref 30.0–36.0)
MCV: 83.5 fL (ref 78.0–100.0)
Monocytes Absolute: 0.6 10*3/uL (ref 0.1–1.0)
Monocytes Relative: 7 %
NEUTROS ABS: 4.5 10*3/uL (ref 1.7–7.7)
NEUTROS PCT: 50 %
Platelets: 393 10*3/uL (ref 150–400)
RBC: 3.52 MIL/uL — AB (ref 3.87–5.11)
RDW: 13.2 % (ref 11.5–15.5)
WBC: 8.8 10*3/uL (ref 4.0–10.5)

## 2017-04-30 LAB — COMPREHENSIVE METABOLIC PANEL
ALBUMIN: 3 g/dL — AB (ref 3.5–5.0)
ALK PHOS: 63 U/L (ref 38–126)
ALT: 13 U/L — AB (ref 14–54)
AST: 20 U/L (ref 15–41)
Anion gap: 6 (ref 5–15)
BILIRUBIN TOTAL: 0.4 mg/dL (ref 0.3–1.2)
BUN: 14 mg/dL (ref 6–20)
CO2: 23 mmol/L (ref 22–32)
CREATININE: 0.92 mg/dL (ref 0.44–1.00)
Calcium: 8.5 mg/dL — ABNORMAL LOW (ref 8.9–10.3)
Chloride: 107 mmol/L (ref 101–111)
GFR calc Af Amer: >60 mL/min (ref >60)
GFR calc non Af Amer: >60 mL/min (ref >60)
GLUCOSE: 291 mg/dL — AB (ref 65–99)
POTASSIUM: 4.3 mmol/L (ref 3.5–5.1)
Sodium: 136 mmol/L (ref 135–145)
TOTAL PROTEIN: 7 g/dL (ref 6.5–8.1)

## 2017-04-30 LAB — URINALYSIS, ROUTINE W REFLEX MICROSCOPIC
BILIRUBIN URINE: NEGATIVE
Ketones, ur: NEGATIVE mg/dL
Nitrite: NEGATIVE
PROTEIN: 100 mg/dL — AB
Specific Gravity, Urine: 1.025 (ref 1.005–1.030)
pH: 6 (ref 5.0–8.0)

## 2017-04-30 LAB — URINALYSIS, MICROSCOPIC (REFLEX)

## 2017-04-30 LAB — GLUCOSE, CAPILLARY: Glucose-Capillary: 250 mg/dL — ABNORMAL HIGH (ref 65–99)

## 2017-04-30 LAB — I-STAT CG4 LACTIC ACID, ED
LACTIC ACID, VENOUS: 1.8 mmol/L (ref 0.5–1.9)
Lactic Acid, Venous: 1.89 mmol/L (ref 0.5–1.9)

## 2017-04-30 LAB — INFLUENZA PANEL BY PCR (TYPE A & B)
Influenza A By PCR: NEGATIVE
Influenza B By PCR: NEGATIVE

## 2017-04-30 LAB — PREGNANCY, URINE: Preg Test, Ur: NEGATIVE

## 2017-04-30 MED ORDER — ONDANSETRON HCL 4 MG PO TABS
4.0000 mg | ORAL_TABLET | Freq: Four times a day (QID) | ORAL | Status: DC | PRN
  Administered 2017-05-02: 4 mg via ORAL
  Filled 2017-04-30: qty 1

## 2017-04-30 MED ORDER — FAMOTIDINE 20 MG PO TABS
20.0000 mg | ORAL_TABLET | Freq: Two times a day (BID) | ORAL | Status: DC
  Administered 2017-04-30 – 2017-05-02 (×4): 20 mg via ORAL
  Filled 2017-04-30 (×4): qty 1

## 2017-04-30 MED ORDER — KETOROLAC TROMETHAMINE 30 MG/ML IJ SOLN
INTRAMUSCULAR | Status: AC
  Filled 2017-04-30: qty 1

## 2017-04-30 MED ORDER — SODIUM CHLORIDE 0.9 % IV BOLUS (SEPSIS)
1000.0000 mL | Freq: Once | INTRAVENOUS | Status: AC
  Administered 2017-04-30: 1000 mL via INTRAVENOUS

## 2017-04-30 MED ORDER — INFLUENZA VAC SPLIT QUAD 0.5 ML IM SUSY
0.5000 mL | PREFILLED_SYRINGE | INTRAMUSCULAR | Status: AC
  Administered 2017-05-02: 0.5 mL via INTRAMUSCULAR
  Filled 2017-04-30: qty 0.5

## 2017-04-30 MED ORDER — SODIUM CHLORIDE 0.45 % IV SOLN
INTRAVENOUS | Status: AC
  Administered 2017-04-30: 21:00:00 via INTRAVENOUS

## 2017-04-30 MED ORDER — ACETAMINOPHEN 325 MG PO TABS: 650.0000 mg | ORAL_TABLET | Freq: Four times a day (QID) | ORAL | Status: DC | PRN

## 2017-04-30 MED ORDER — KETOROLAC TROMETHAMINE 30 MG/ML IJ SOLN
30.0000 mg | Freq: Four times a day (QID) | INTRAMUSCULAR | Status: DC
  Administered 2017-04-30 – 2017-05-01 (×2): 30 mg via INTRAVENOUS
  Filled 2017-04-30 (×2): qty 1

## 2017-04-30 MED ORDER — METHOCARBAMOL 500 MG PO TABS
750.0000 mg | ORAL_TABLET | Freq: Four times a day (QID) | ORAL | Status: DC | PRN
  Administered 2017-05-01: 750 mg via ORAL
  Filled 2017-04-30: qty 2

## 2017-04-30 MED ORDER — INSULIN ASPART 100 UNIT/ML ~~LOC~~ SOLN
0.0000 [IU] | Freq: Three times a day (TID) | SUBCUTANEOUS | Status: DC
  Administered 2017-05-01: 4 [IU] via SUBCUTANEOUS
  Administered 2017-05-01: 7 [IU] via SUBCUTANEOUS
  Administered 2017-05-01: 11 [IU] via SUBCUTANEOUS
  Administered 2017-05-02: 3 [IU] via SUBCUTANEOUS
  Administered 2017-05-02: 11 [IU] via SUBCUTANEOUS
  Administered 2017-05-02: 4 [IU] via SUBCUTANEOUS

## 2017-04-30 MED ORDER — MORPHINE SULFATE (PF) 4 MG/ML IV SOLN
4.0000 mg | Freq: Once | INTRAVENOUS | Status: AC
  Administered 2017-04-30: 4 mg via INTRAVENOUS
  Filled 2017-04-30: qty 1

## 2017-04-30 MED ORDER — ONDANSETRON HCL 4 MG/2ML IJ SOLN: 4.0000 mg | Freq: Four times a day (QID) | INTRAMUSCULAR | Status: DC | PRN

## 2017-04-30 MED ORDER — KETOROLAC TROMETHAMINE 30 MG/ML IJ SOLN
30.0000 mg | Freq: Once | INTRAMUSCULAR | Status: AC
  Administered 2017-04-30: 30 mg via INTRAVENOUS

## 2017-04-30 MED ORDER — MORPHINE SULFATE (PF) 4 MG/ML IV SOLN
4.0000 mg | INTRAVENOUS | Status: DC | PRN
Start: 2017-04-30 — End: 2017-05-02
  Administered 2017-04-30 – 2017-05-02 (×8): 4 mg via INTRAVENOUS
  Filled 2017-04-30 (×8): qty 1

## 2017-04-30 MED ORDER — ONDANSETRON HCL 4 MG/2ML IJ SOLN
4.0000 mg | Freq: Once | INTRAMUSCULAR | Status: AC
  Administered 2017-04-30: 4 mg via INTRAVENOUS
  Filled 2017-04-30: qty 2

## 2017-04-30 MED ORDER — SODIUM CHLORIDE 0.45 % IV SOLN: INTRAVENOUS | Status: DC

## 2017-04-30 NOTE — ED Provider Notes (Signed)
[facility] DE[facility] Provider Note   CSN: [id number] Arrival date & time: 04/30/17  1136     History   Chief Complaint Chief Complaint  Patient presents with  . Fever    neck stiffness    HPI Ramonita Negro is a 36 y.o. female.  HPI 36 year old female who presents with fever, headache, nausea and vomiting. She reports that 2-3 days ago she developed fever of 103F with severe generalized headache, nausea, vomiting. Associated with photophobia. Yesterday with soreness in her neck, and this morning unable to move neck. No sick contacts. No diarrhea, abdominal pain, cough, dyspnea, body aches, dysuria, urinary frequency. History of DM, HTN, and obesity.  Past Medical History:  Diagnosis Date  . Anemia   . Anxiety   . Chronic bronchitis ([place])   . Hypertension   . Morbid obesity ([place])   . Type II diabetes mellitus [facility])     Patient Active Problem List   Diagnosis Date Noted  . Staphylococcus aureus bacteremia 03/15/2016  . Streptococcal bacteremia 03/15/2016  . Tachycardia 03/14/2016  . Fever 03/14/2016  . Headache 03/14/2016  . Morbid obesity ([place])   . Anxiety   . Chest pain at rest   . HTN (hypertension) 12/22/2015  . Diabetes ([place]) 01/01/2014    Past Surgical History:  Procedure Laterality Date  . CARDIAC CATHETERIZATION  01/06/2016  . CARDIAC CATHETERIZATION N/A 01/06/2016   Procedure: Left Heart Cath and Coronary Angiography;  Surgeon: Wellington Hampshire, MD;  Location: [facility];  Service: Cardiovascular;  Laterality: N/A;  . CESAREAN SECTION  08/2014    OB History    Gravida Para Term Preterm AB Living   1             SAB TAB Ectopic Multiple Live Births                   Home Medications    Prior to Admission medications   Medication Sig Start Date End Date Taking? Authorizing Provider  acetaminophen (TYLENOL) 325 MG tablet Take 2 tablets (650 mg total) by mouth every 6 (six) hours as needed for mild pain (or Fever >/= 101). Patient not  taking: Reported on 05/18/2016 03/17/16   Kelvin Cellar, MD  acetaminophen (TYLENOL) 500 MG tablet Take 1 tablet (500 mg total) by mouth every 6 (six) hours as needed. 08/22/16   Mackuen, Courteney Lyn, MD  ALPRAZolam (XANAX) 0.5 MG tablet Take 0.5 mg by mouth daily as needed for anxiety. 02/19/16   [provider]  cyclobenzaprine (FLEXERIL) 10 MG tablet Take 1 tablet (10 mg total) by mouth 2 (two) times daily as needed for muscle spasms. 05/18/16   Montine Circle, PA-C  docusate sodium (COLACE) 100 MG capsule Take 1 capsule (100 mg total) by mouth 2 (two) times daily. Patient not taking: Reported on 05/18/2016 03/17/16   Kelvin Cellar, MD  glipiZIDE (GLUCOTROL) 10 MG tablet Take 10 mg by mouth daily.    [provider]  HYDROcodone-acetaminophen (NORCO/VICODIN) 5-325 MG tablet Take 1-2 tablets by mouth every 6 (six) hours as needed. 05/18/16   Montine Circle, PA-C  ibuprofen (ADVIL,MOTRIN) 800 MG tablet Take 1 tablet (800 mg total) by mouth 3 (three) times daily. 08/22/16   Mackuen, Courteney Lyn, MD  insulin aspart (NOVOLOG) 100 UNIT/ML injection Inject into the skin 3 (three) times daily before meals.    [provider]  Insulin Glargine (BASAGLAR KWIKPEN) 100 UNIT/ML SOPN Inject 48 Units into the skin at bedtime.  [provider]  Iron-FA-B Cmp-C-Biot-Probiotic (FUSION PLUS) CAPS Take 1 capsule by mouth daily.    [provider]  losartan (COZAAR) 100 MG tablet Take 1 tablet (100 mg total) by mouth daily. 12/23/15   Rai, Ripudeep Raliegh Ip, MD  metFORMIN (GLUCOPHAGE) 1000 MG tablet Take 1,500 mg by mouth daily with breakfast.     [provider]  olmesartan (BENICAR) 20 MG tablet Take 20 mg by mouth daily.    [provider]  pantoprazole (PROTONIX) 40 MG tablet Take 1 tablet (40 mg total) by mouth daily. 12/23/15   Rai, Vernelle Emerald, MD  polyethylene glycol (MIRALAX / GLYCOLAX) packet Take 17 g by mouth daily. Patient not taking: Reported on  05/18/2016 03/17/16   Kelvin Cellar, MD  sertraline (ZOLOFT) 50 MG tablet Take 50 mg by mouth daily. 02/15/16   [provider]    Family History Family History  Problem Relation Age of Onset  . Diabetes Mother   . Hypertension Mother   . Thyroid disease Mother   . Kidney disease Maternal Grandmother   . Diabetes Maternal Grandmother   . Heart attack Unknown     Social History Social History  Substance Use Topics  . Smoking status: Never Smoker  . Smokeless tobacco: Never Used  . Alcohol use No     Allergies   Patient has no known allergies.   Review of Systems Review of Systems  Constitutional: Positive for fatigue and fever.  HENT: Negative for congestion and sore throat.   Respiratory: Negative for cough and shortness of breath.   Cardiovascular: Negative for chest pain.  Gastrointestinal: Positive for nausea and vomiting.  Genitourinary: Negative for dysuria and frequency.  Neurological: Positive for headaches.  All other systems reviewed and are negative.    Physical Exam Updated Vital Signs BP (!) 170/93   Pulse (!) 107   Temp 98.6 F (37 C) (Oral)   Resp 18   Ht 5\' 5"  (1.651 m)   Wt 130.2 kg (287 lb)   LMP 04/30/2017   SpO2 100%   BMI 47.76 kg/m   Physical Exam Physical Exam  Nursing note and vitals reviewed. Constitutional: appears unwell but  non-toxic, and in no acute distress Head: Normocephalic and atraumatic.  Mouth/Throat: Oropharynx is clear and moist.  Neck: Limited ROM of the neck Cardiovascular: Tachycardia rate and regular rhythm.   Pulmonary/Chest: Effort normal and breath sounds normal.  Abdominal: Soft. There is no tenderness. There is no rebound and no guarding.  Musculoskeletal: Normal range of motion.  Neurological: Alert, no facial droop, fluent speech, moves all extremities symmetrically Skin: Skin is warm and dry.  Psychiatric: Cooperative   ED Treatments / Results  Labs (all labs ordered are listed, but  only abnormal results are displayed) Labs Reviewed  CBC WITH DIFFERENTIAL/PLATELET - Abnormal; Notable for the following:       Result Value   RBC 3.52 (*)    Hemoglobin 9.8 (*)    HCT 29.4 (*)    All other components within normal limits  COMPREHENSIVE METABOLIC PANEL - Abnormal; Notable for the following:    Glucose, Bld 291 (*)    Calcium 8.5 (*)    Albumin 3.0 (*)    ALT 13 (*)    All other components within normal limits  URINALYSIS, ROUTINE W REFLEX MICROSCOPIC - Abnormal; Notable for the following:    Color, Urine STRAW (*)    APPearance CLOUDY (*)    Glucose, UA >=500 (*)  Hgb urine dipstick MODERATE (*)    Protein, ur 100 (*)    Leukocytes, UA TRACE (*)    All other components within normal limits  URINALYSIS, MICROSCOPIC (REFLEX) - Abnormal; Notable for the following:    Bacteria, UA MANY (*)    Squamous Epithelial / LPF 6-30 (*)    All other components within normal limits  CSF CULTURE  GRAM STAIN  CULTURE, BLOOD (ROUTINE X 2)  CULTURE, BLOOD (ROUTINE X 2)  PREGNANCY, URINE  INFLUENZA PANEL BY PCR (TYPE A & B)  CSF CELL COUNT WITH DIFFERENTIAL  CSF CELL COUNT WITH DIFFERENTIAL  GLUCOSE, CSF  PROTEIN, CSF  I-STAT CG4 LACTIC ACID, ED  I-STAT CG4 LACTIC ACID, ED    EKG  EKG Interpretation None       Radiology Dg Chest 2 View  Result Date: 04/30/2017 CLINICAL DATA:  Fever, headache, nausea/vomiting EXAM: CHEST  2 VIEW COMPARISON:  01/31/2017 FINDINGS: Lungs are clear.  No pleural effusion or pneumothorax. The heart is normal in size. Mild degenerative changes of the visualized thoracolumbar spine. IMPRESSION: Normal chest radiographs. Electronically Signed   By: Julian Hy M.D.   On: 04/30/2017 13:17   Ct Head Wo Contrast  Result Date: 04/30/2017 CLINICAL DATA:  Headache EXAM: CT HEAD WITHOUT CONTRAST TECHNIQUE: Contiguous axial images were obtained from the base of the skull through the vertex without intravenous contrast. COMPARISON:  MRI  brain dated 03/14/2016 FINDINGS: Brain: No evidence of acute infarction, hemorrhage, hydrocephalus, extra-axial collection or mass lesion/mass effect. Vascular: No hyperdense vessel or unexpected calcification. Skull: Normal. Negative for fracture or focal lesion. Sinuses/Orbits: The visualized paranasal sinuses are essentially clear. The mastoid air cells are unopacified. Other: None. IMPRESSION: Normal head CT. Electronically Signed   By: Julian Hy M.D.   On: 04/30/2017 13:16    Procedures Procedures (including critical care time)  Medications Ordered in ED Medications  sodium chloride 0.9 % bolus 1,000 mL (0 mLs Intravenous Stopped 04/30/17 1524)  morphine 4 MG/ML injection 4 mg (4 mg Intravenous Given 04/30/17 1308)  ondansetron (ZOFRAN) injection 4 mg (4 mg Intravenous Given 04/30/17 1309)     Initial Impression / Assessment and Plan / ED Course  I have reviewed the triage vital signs and the nursing notes.  Pertinent labs & imaging results that were available during my care of the patient were reviewed by me and considered in my medical decision making (see chart for details).     Patient with 2 days of headache, fever, nausea vomiting, photophobia and neck stiffness. Is afebrile here, but she states that she took antipyretics prior to arrival for fever of 103.She is clinically nontoxic and in no acute distress. With difficulty with range of motion of her neck on exam. Clinical concerns for meningitis, although given her nontoxic appearance possible viral.  Records are reviewed, and she had a similar picture 1 year ago in 2017. She underwent LP in the ED and then fluoroscopy LP, but no samples were able to be collected due to technical difficulties.she was empirically started on antibiotics. Had a reassuring MRI. Ultimately blood cultures were concerning for MSSA and GBS. She had a PICC line with outpatient ceftriaxone for several weeks.  Here her blood work is reassuring. No  significant leukocytosis or elevated lactic acid. No evidence of obvious infection and the UA or chest x-ray. Influenza swab is pending. I attempted to do LP, but due to body habitus, unable to obtain CSF samples.  I discussed with Dr. Drucilla Schmidt  from ID. He recommended admission for potential MRI to rule out meningitis versus encephalitis. At this time he felt it was reasonable to hold any antibiotics given that she is clinically well-appearing. Will continue to consult if paged when admitted.  Discussed with Dr. Florene Glen, who will admit to [facility] hospital.  Final Clinical Impressions(s) / ED Diagnoses   Final diagnoses:  Intractable headache, unspecified chronicity pattern, unspecified headache type  Non-intractable vomiting with nausea, unspecified vomiting type  Neck stiffness    New Prescriptions New Prescriptions   No medications on file     Forde Dandy, MD 04/30/17 1553

## 2017-04-30 NOTE — ED Triage Notes (Signed)
Patient states that she has had a fever and neck stiffness x 2 -3 days. The patient states that she started to have N/V last night and worsening pain and fever. PAtient is unable to move neck without pain

## 2017-04-30 NOTE — H&P (Addendum)
History and Physical    Jacqueline [name] ENI:[id number] DOB: 23-Feb-1981 DOA: 04/30/2017  PCP: Lin Landsman, MD   Patient coming from: [facility].  I have personally briefly reviewed patient's old medical records in [place]  Chief Complaint: Headache.  HPI: Jacqueline [name] is a 36 y.o. female with medical history significant of chronic anemia, anxiety,chronic bronchitis, hypertension, morbid obesity, type 2 diabetes who is being transferred from [facility] after presenting there with 2-3 days of fever, associated with headache, nausea and photophobia. She complains of soreness in her neck since yesterday and had nausea with vomiting last night. There was some suspicion for meningitis and trial of lumbar puncture was done unsuccessfully. She had a similar episode last year in August. She denies travel history or sick contacts. She had breast reduction surgery on September 4, but denies any significant sequelae from it. She denies sore throat, productive cough, dyspnea, PND, chest pain, dizziness, palpitations, diaphoresis, but complains of frequent lower extremity edema. Frequent post-prandial RUQ pain and constipation (secondary to post-op hydromorphone use). No diarrhea, melena or hematochezia. Denies dysuria, frequency or hematuria.  She has been very thirsty lately, but denies polyuria.  ED Course: Initial vital signs temperature 37.3C, pulse 109, respirations 18, blood pressure 165/75 mmHg and O2 sat 100% on room air.  Workup shows urinalysis with trace leukocyte esterase, moderate hemoglobinuria, proteinuria of 100 and glucosuria more than 500 mg/dL. Urine pregnancy test was negative. Blood cultures 2 were drawn. WBC was 8.8, hemoglobin 9.8 g/dL and platelets 393. Lactic acid 2 was normal. Her BMP was normal except for hyperglycemia of 291 mg/dL.  Imaging: Normal chest radiograph and head CT.  The case was presented to infectious diseases on call who recommended observation and  no antibiotics for now. Please reconsult ID as needed.  Review of Systems: As per HPI otherwise 10 point review of systems negative.    Past Medical History:  Diagnosis Date  . Anemia   . Anxiety   . Chronic bronchitis ([place])   . Hypertension   . Morbid obesity ([place])   . Type II diabetes mellitus ([place])     Past Surgical History:  Procedure Laterality Date  . BREAST REDUCTION SURGERY  03/21/2017  . CARDIAC CATHETERIZATION  01/06/2016  . CARDIAC CATHETERIZATION N/A 01/06/2016   Procedure: Left Heart Cath and Coronary Angiography;  Surgeon: Wellington Hampshire, MD;  Location: [facility];  Service: Cardiovascular;  Laterality: N/A;  . CESAREAN SECTION  08/2014     reports that she has never smoked. She has never used smokeless tobacco. She reports that she does not drink alcohol or use drugs.  No Known Allergies  Family History  Problem Relation Age of Onset  . Diabetes Mother   . Hypertension Mother   . Thyroid disease Mother   . Kidney disease Maternal Grandmother   . Diabetes Maternal Grandmother   . Heart attack Unknown     Prior to Admission medications   Medication Sig Start Date End Date Taking? Authorizing Provider  acetaminophen (TYLENOL) 500 MG tablet Take 1 tablet (500 mg total) by mouth every 6 (six) hours as needed. 08/22/16  Yes Mackuen, Courteney Lyn, MD  HYDROmorphone (DILAUDID) 2 MG tablet Take 2 mg by mouth every 4 (four) hours as needed. 03/06/17  Yes [provider]  ibuprofen (ADVIL,MOTRIN) 200 MG tablet Take 400 mg by mouth every 6 (six) hours as needed for fever or moderate pain.   Yes [provider]  insulin aspart (  NOVOLOG) 100 UNIT/ML injection Inject 10 Units into the skin 3 (three) times daily before meals.    Yes [provider]  olmesartan-hydrochlorothiazide (BENICAR HCT) 40-25 MG tablet Take 1 tablet by mouth daily. 01/15/17  Yes [provider]  acetaminophen (TYLENOL) 325 MG tablet Take 2 tablets (650 mg  total) by mouth every 6 (six) hours as needed for mild pain (or Fever >/= 101). Patient not taking: Reported on 05/18/2016 03/17/16   Kelvin Cellar, MD  cyclobenzaprine (FLEXERIL) 10 MG tablet Take 1 tablet (10 mg total) by mouth 2 (two) times daily as needed for muscle spasms. Patient not taking: Reported on 04/30/2017 05/18/16   Montine Circle, PA-C  docusate sodium (COLACE) 100 MG capsule Take 1 capsule (100 mg total) by mouth 2 (two) times daily. Patient not taking: Reported on 05/18/2016 03/17/16   Kelvin Cellar, MD  HYDROcodone-acetaminophen (NORCO/VICODIN) 5-325 MG tablet Take 1-2 tablets by mouth every 6 (six) hours as needed. Patient not taking: Reported on 04/30/2017 05/18/16   Montine Circle, PA-C  ibuprofen (ADVIL,MOTRIN) 800 MG tablet Take 1 tablet (800 mg total) by mouth 3 (three) times daily. Patient not taking: Reported on 04/30/2017 08/22/16   Mackuen, Fredia Sorrow, MD  losartan (COZAAR) 100 MG tablet Take 1 tablet (100 mg total) by mouth daily. Patient not taking: Reported on 04/30/2017 12/23/15   Rai, Vernelle Emerald, MD  pantoprazole (PROTONIX) 40 MG tablet Take 1 tablet (40 mg total) by mouth daily. Patient not taking: Reported on 04/30/2017 12/23/15   Mendel Corning, MD    Physical Exam: Vitals:   04/30/17 1515 04/30/17 1526 04/30/17 1643 04/30/17 1806  BP:   (!) 153/78 (!) 156/80  Pulse: (!) 107  100 (!) 101  Resp: 18  18 20   Temp:  98.6 F (37 C) 97.9 F (36.6 C) 98.2 F (36.8 C)  TempSrc:  Oral Oral Oral  SpO2: 100%  99% 100%  Weight:    131.1 kg (289 lb 1.6 oz)  Height:    5\' 4"  (1.626 m)    Constitutional: NAD, calm, comfortable Eyes: PERRL, lids and conjunctivae normal ENMT: Mucous membranes are moist. Posterior pharynx clear of any exudate or lesions. Neck: normal, positive neck stiffness, no masses, no thyromegaly Respiratory: clear to auscultation bilaterally, no wheezing, no crackles. Normal respiratory effort. No accessory muscle use.  Cardiovascular:  Regular rate and rhythm, no murmurs / rubs / gallops. 1+ lower extremities edema. 2+ pedal pulses. No carotid bruits.  Abdomen: obese, soft, no tenderness, no masses palpated. No hepatosplenomegaly. Bowel sounds positive.  Musculoskeletal: no clubbing / cyanosis. Good ROM, no contractures. Normal muscle tone.  Skin: no Janeway lesions or Osler's nodes,petechia or ecchymosis, significant rashes, lesions, ulcers on limited skin exam Neurologic: CN 2-12 grossly intact. Sensation intact, DTR normal. Strength 5/5 in all 4.  Psychiatric: Normal judgment and insight. Alert and oriented x 4,. Normal mood.    Labs on Admission: I have personally reviewed following labs and imaging studies  CBC:  Recent Labs Lab 04/30/17 1230  WBC 8.8  NEUTROABS 4.5  HGB 9.8*  HCT 29.4*  MCV 83.5  PLT 662   Basic Metabolic Panel:  Recent Labs Lab 04/30/17 1230  NA 136  K 4.3  CL 107  CO2 23  GLUCOSE 291*  BUN 14  CREATININE 0.92  CALCIUM 8.5*   GFR: Estimated Creatinine Clearance: 112.7 mL/min (by C-G formula based on SCr of 0.92 mg/dL). Liver Function Tests:  Recent Labs Lab 04/30/17 1230  AST 20  ALT 13*  ALKPHOS 63  BILITOT 0.4  PROT 7.0  ALBUMIN 3.0*   No results for input(s): LIPASE, AMYLASE in the last 168 hours. No results for input(s): AMMONIA in the last 168 hours. Coagulation Profile: No results for input(s): INR, PROTIME in the last 168 hours. Cardiac Enzymes: No results for input(s): CKTOTAL, CKMB, CKMBINDEX, TROPONINI in the last 168 hours. BNP (last 3 results) No results for input(s): PROBNP in the last 8760 hours. HbA1C: No results for input(s): HGBA1C in the last 72 hours. CBG: No results for input(s): GLUCAP in the last 168 hours. Lipid Profile: No results for input(s): CHOL, HDL, LDLCALC, TRIG, CHOLHDL, LDLDIRECT in the last 72 hours. Thyroid Function Tests: No results for input(s): TSH, T4TOTAL, FREET4, T3FREE, THYROIDAB in the last 72 hours. Anemia  Panel: No results for input(s): VITAMINB12, FOLATE, FERRITIN, TIBC, IRON, RETICCTPCT in the last 72 hours. Urine analysis:    Component Value Date/Time   COLORURINE STRAW (A) 04/30/2017 1240   APPEARANCEUR CLOUDY (A) 04/30/2017 1240   LABSPEC 1.025 04/30/2017 1240   PHURINE 6.0 04/30/2017 1240   GLUCOSEU >=500 (A) 04/30/2017 1240   HGBUR MODERATE (A) 04/30/2017 1240   BILIRUBINUR NEGATIVE 04/30/2017 1240   KETONESUR NEGATIVE 04/30/2017 1240   PROTEINUR 100 (A) 04/30/2017 1240   UROBILINOGEN 0.2 08/21/2012 2130   NITRITE NEGATIVE 04/30/2017 1240   LEUKOCYTESUR TRACE (A) 04/30/2017 1240    Radiological Exams on Admission: Dg Chest 2 View  Result Date: 04/30/2017 CLINICAL DATA:  Fever, headache, nausea/vomiting EXAM: CHEST  2 VIEW COMPARISON:  01/31/2017 FINDINGS: Lungs are clear.  No pleural effusion or pneumothorax. The heart is normal in size. Mild degenerative changes of the visualized thoracolumbar spine. IMPRESSION: Normal chest radiographs. Electronically Signed   By: Julian Hy M.D.   On: 04/30/2017 13:17   Ct Head Wo Contrast  Result Date: 04/30/2017 CLINICAL DATA:  Headache EXAM: CT HEAD WITHOUT CONTRAST TECHNIQUE: Contiguous axial images were obtained from the base of the skull through the vertex without intravenous contrast. COMPARISON:  MRI brain dated 03/14/2016 FINDINGS: Brain: No evidence of acute infarction, hemorrhage, hydrocephalus, extra-axial collection or mass lesion/mass effect. Vascular: No hyperdense vessel or unexpected calcification. Skull: Normal. Negative for fracture or focal lesion. Sinuses/Orbits: The visualized paranasal sinuses are essentially clear. The mastoid air cells are unopacified. Other: None. IMPRESSION: Normal head CT. Electronically Signed   By: Julian Hy M.D.   On: 04/30/2017 13:16   03/16/2016 echocardiogram  ------------------------------------------------------------------- LV EF: 60% -    65%  ------------------------------------------------------------------- Indications:      Bacteremia 790.7.  ------------------------------------------------------------------- History:   PMH:  Morbid obesity. Chronic bronchitis. Anemia.  Risk factors:  Hypertension. Diabetes mellitus.  ------------------------------------------------------------------- Study Conclusions  - Left ventricle: The cavity size was normal. Wall thickness was   increased in a pattern of mild LVH. Systolic function was normal.   The estimated ejection fraction was in the range of 60% to 65%.   Wall motion was normal; there were no regional wall motion   abnormalities. Features are consistent with a pseudonormal left   ventricular filling pattern, with concomitant abnormal relaxation   and increased filling pressure (grade 2 diastolic dysfunction).   EKG: Independently reviewed.   Assessment/Plan Principal Problem:   Headache Observation/telemetry. Frequent neuro checks. Continue IV fluids. Continue analgesics. Muscle relaxant as needed. Follow-up blood cultures and sensitivity. ID to be reconsulted if needed. Check MRI to brain to rule out encephalitis or meningitis.  Active Problems:   Fever Continue IV fluids.  Follow-up blood cultures and sensitivity.    HTN (hypertension) Hold Benicar HCT. Resume if blood pressure rises or if the patient feels better.    Type II diabetes mellitus (HCC) Carbohydrate modified diet. CBG monitoring with regular insulin sliding scale.    Anemia Check anemia panel. Monitor hematocrit and hemoglobin.     DVT prophylaxis: SCDs. Code Status: full code. Family Communication:  Disposition Plan: overnight observation with MRI of brain without contrast per ID. Consults called:Dr. Brantley Stage discussed the case with Dr. Dietrich [place] from ID Admission status: observation/elemetry.   Reubin Milan MD [facility] Pager [phone].  If  7PM-7AM, please contact night-coverage [email or web address] Password [facility]  04/30/2017, 8:39 PM

## 2017-04-30 NOTE — ED Notes (Signed)
Pt remains on auto VS and cardiac monitor

## 2017-04-30 NOTE — ED Notes (Signed)
ED MD in for LP

## 2017-04-30 NOTE — ED Notes (Signed)
Attempted to call report. RN "Abigail Butts" will call me back.

## 2017-04-30 NOTE — ED Notes (Signed)
LP done by Dr Oleta Mouse, unsuccessful collection. Pt tolerated well.

## 2017-04-30 NOTE — ED Notes (Signed)
Consent signed for LP and given to secretary.

## 2017-04-30 NOTE — ED Notes (Signed)
Pt on cardiac monitor and auto VS 

## 2017-05-01 ENCOUNTER — Observation Stay (HOSPITAL_COMMUNITY): Payer: BLUE CROSS/BLUE SHIELD

## 2017-05-01 DIAGNOSIS — D6489 Other specified anemias: Secondary | ICD-10-CM | POA: Diagnosis not present

## 2017-05-01 DIAGNOSIS — E0800 Diabetes mellitus due to underlying condition with hyperosmolarity without nonketotic hyperglycemic-hyperosmolar coma (NKHHC): Secondary | ICD-10-CM | POA: Diagnosis not present

## 2017-05-01 DIAGNOSIS — R05 Cough: Secondary | ICD-10-CM

## 2017-05-01 DIAGNOSIS — R51 Headache: Secondary | ICD-10-CM | POA: Diagnosis not present

## 2017-05-01 DIAGNOSIS — M436 Torticollis: Secondary | ICD-10-CM | POA: Diagnosis not present

## 2017-05-01 DIAGNOSIS — G43109 Migraine with aura, not intractable, without status migrainosus: Secondary | ICD-10-CM | POA: Diagnosis not present

## 2017-05-01 DIAGNOSIS — M542 Cervicalgia: Secondary | ICD-10-CM | POA: Diagnosis not present

## 2017-05-01 DIAGNOSIS — Z841 Family history of disorders of kidney and ureter: Secondary | ICD-10-CM | POA: Diagnosis not present

## 2017-05-01 DIAGNOSIS — Z794 Long term (current) use of insulin: Secondary | ICD-10-CM | POA: Diagnosis not present

## 2017-05-01 DIAGNOSIS — R509 Fever, unspecified: Secondary | ICD-10-CM | POA: Diagnosis not present

## 2017-05-01 DIAGNOSIS — Z8249 Family history of ischemic heart disease and other diseases of the circulatory system: Secondary | ICD-10-CM | POA: Diagnosis not present

## 2017-05-01 DIAGNOSIS — E119 Type 2 diabetes mellitus without complications: Secondary | ICD-10-CM | POA: Diagnosis not present

## 2017-05-01 DIAGNOSIS — Z833 Family history of diabetes mellitus: Secondary | ICD-10-CM | POA: Diagnosis not present

## 2017-05-01 DIAGNOSIS — Z8349 Family history of other endocrine, nutritional and metabolic diseases: Secondary | ICD-10-CM | POA: Diagnosis not present

## 2017-05-01 DIAGNOSIS — I10 Essential (primary) hypertension: Secondary | ICD-10-CM | POA: Diagnosis not present

## 2017-05-01 LAB — CBC WITH DIFFERENTIAL/PLATELET
BASOS ABS: 0 10*3/uL (ref 0.0–0.1)
BASOS PCT: 0 %
EOS ABS: 0.2 10*3/uL (ref 0.0–0.7)
Eosinophils Relative: 3 %
HEMATOCRIT: 27.5 % — AB (ref 36.0–46.0)
HEMOGLOBIN: 9.4 g/dL — AB (ref 12.0–15.0)
Lymphocytes Relative: 40 %
Lymphs Abs: 3.1 10*3/uL (ref 0.7–4.0)
MCH: 28.2 pg (ref 26.0–34.0)
MCHC: 34.2 g/dL (ref 30.0–36.0)
MCV: 82.6 fL (ref 78.0–100.0)
MONO ABS: 0.5 10*3/uL (ref 0.1–1.0)
MONOS PCT: 6 %
NEUTROS ABS: 3.9 10*3/uL (ref 1.7–7.7)
NEUTROS PCT: 51 %
Platelets: 363 10*3/uL (ref 150–400)
RBC: 3.33 MIL/uL — ABNORMAL LOW (ref 3.87–5.11)
RDW: 13.7 % (ref 11.5–15.5)
WBC: 7.8 10*3/uL (ref 4.0–10.5)

## 2017-05-01 LAB — GLUCOSE, CAPILLARY
GLUCOSE-CAPILLARY: 210 mg/dL — AB (ref 65–99)
GLUCOSE-CAPILLARY: 189 mg/dL — AB (ref 65–99)
GLUCOSE-CAPILLARY: 174 mg/dL — AB (ref 65–99)
Glucose-Capillary: 254 mg/dL — ABNORMAL HIGH (ref 65–99)

## 2017-05-01 LAB — BASIC METABOLIC PANEL
ANION GAP: 7 (ref 5–15)
BUN: 13 mg/dL (ref 6–20)
CALCIUM: 8.2 mg/dL — AB (ref 8.9–10.3)
CO2: 23 mmol/L (ref 22–32)
CREATININE: 0.99 mg/dL (ref 0.44–1.00)
Chloride: 108 mmol/L (ref 101–111)
GFR calc Af Amer: >60 mL/min (ref >60)
Glucose, Bld: 277 mg/dL — ABNORMAL HIGH (ref 65–99)
Potassium: 4.1 mmol/L (ref 3.5–5.1)
SODIUM: 138 mmol/L (ref 135–145)

## 2017-05-01 LAB — HEMOGLOBIN A1C
HEMOGLOBIN A1C: 10.8 % — AB (ref 4.8–5.6)
MEAN PLASMA GLUCOSE: 263.26 mg/dL

## 2017-05-01 LAB — RETICULOCYTES
RBC.: 3.33 MIL/uL — AB (ref 3.87–5.11)
RETIC COUNT ABSOLUTE: 93.2 10*3/uL (ref 19.0–186.0)
Retic Ct Pct: 2.8 % (ref 0.4–3.1)

## 2017-05-01 LAB — HIV ANTIBODY (ROUTINE TESTING W REFLEX): HIV SCREEN 4TH GENERATION: NONREACTIVE

## 2017-05-01 LAB — FERRITIN: Ferritin: 50 ng/mL (ref 11–307)

## 2017-05-01 LAB — FOLATE: FOLATE: 10.3 ng/mL (ref >5.9)

## 2017-05-01 LAB — VITAMIN B12: VITAMIN B 12: 344 pg/mL (ref 180–914)

## 2017-05-01 LAB — IRON AND TIBC
IRON: 46 ug/dL (ref 28–170)
Saturation Ratios: 21 % (ref 10.4–31.8)
TIBC: 224 ug/dL — ABNORMAL LOW (ref 250–450)
UIBC: 178 ug/dL

## 2017-05-01 MED ORDER — INSULIN ASPART 100 UNIT/ML ~~LOC~~ SOLN
3.0000 [IU] | Freq: Three times a day (TID) | SUBCUTANEOUS | Status: DC
  Administered 2017-05-01 – 2017-05-02 (×5): 3 [IU] via SUBCUTANEOUS

## 2017-05-01 MED ORDER — METOCLOPRAMIDE HCL 5 MG/ML IJ SOLN
5.0000 mg | Freq: Once | INTRAMUSCULAR | Status: AC
  Administered 2017-05-01: 5 mg via INTRAVENOUS
  Filled 2017-05-01: qty 2

## 2017-05-01 MED ORDER — OLMESARTAN MEDOXOMIL-HCTZ 40-25 MG PO TABS: 1.0000 | ORAL_TABLET | Freq: Every day | ORAL | Status: DC

## 2017-05-01 MED ORDER — INSULIN GLARGINE 100 UNIT/ML ~~LOC~~ SOLN
15.0000 [IU] | Freq: Every day | SUBCUTANEOUS | Status: DC
  Administered 2017-05-01 – 2017-05-02 (×2): 15 [IU] via SUBCUTANEOUS
  Filled 2017-05-01 (×2): qty 0.15

## 2017-05-01 MED ORDER — IRBESARTAN 150 MG PO TABS
150.0000 mg | ORAL_TABLET | Freq: Every day | ORAL | Status: DC
  Administered 2017-05-01 – 2017-05-02 (×2): 150 mg via ORAL
  Filled 2017-05-01 (×2): qty 1

## 2017-05-01 MED ORDER — HYDROCHLOROTHIAZIDE 25 MG PO TABS
25.0000 mg | ORAL_TABLET | Freq: Every day | ORAL | Status: DC
  Administered 2017-05-01 – 2017-05-02 (×2): 25 mg via ORAL
  Filled 2017-05-01 (×2): qty 1

## 2017-05-01 MED ORDER — DIPHENHYDRAMINE HCL 50 MG/ML IJ SOLN
25.0000 mg | Freq: Once | INTRAMUSCULAR | Status: AC
  Administered 2017-05-01: 25 mg via INTRAVENOUS
  Filled 2017-05-01: qty 1

## 2017-05-01 MED ORDER — KETOROLAC TROMETHAMINE 30 MG/ML IJ SOLN
30.0000 mg | Freq: Once | INTRAMUSCULAR | Status: AC
  Administered 2017-05-01: 30 mg via INTRAVENOUS
  Filled 2017-05-01: qty 1

## 2017-05-01 NOTE — Consult Note (Signed)
[facility]       Reason for Consult: ? meningitis    Referring Physician: Dr. Wyline Copas  Principal Problem:   Headache Active Problems:   HTN (hypertension)   Fever   Type II diabetes mellitus ([place])   Anemia   . diphenhydrAMINE  25 mg Intravenous Once  . famotidine  20 mg Oral BID  . hydrochlorothiazide  25 mg Oral Daily  . Influenza vac split quadrivalent PF  0.5 mL Intramuscular Tomorrow-1000  . insulin aspart  0-20 Units Subcutaneous TID WC  . insulin aspart  3 Units Subcutaneous TID WC  . insulin glargine  15 Units Subcutaneous Daily  . irbesartan  150 mg Oral Daily  . ketorolac  30 mg Intravenous Once  . metoCLOPramide (REGLAN) injection  5 mg Intravenous Once    Recommendations: D/c droplet precautions Observe off of antibiotics Migraine treatment   Assessment: She has neck pain that is isolated to her neck, no palpable tenderness, does not radiate up her head, no fever here and a normal wbc count.  The differential is a migraine or viral/medication induced meningitis. She did not take NSAIDs prior to onset or other associated medications.  She does have young children.  I do not suspect a bacterial origin of meningitis since she looks relatively well.     Please call with any changes. thanks  Antibiotics: none  HPI: Jacqueline [name] is a 35 y.o. female with chronic anemia, morbid obesity, DM2 and HTN who had 3 days of neck pain and a fever at home.  Neck stiffness worse with chin to chest but no LP able to be done.  No fever since admission.  No new medications or excessive NSAIDS as above, no known sick contacts.  She did describe an aura with spots in her eyes prior to onset.  Had a similar episode 1 year ago, no work up performed then.  MRI without any significant findings.  Some associated n/v.  No diarrhea.    Review of Systems:  Constitutional: positive for fevers or negative for chills Gastrointestinal: negative for  diarrhea Integument/breast: negative for rash Musculoskeletal: negative for myalgias and arthralgias Neurological: positive for dizziness, negative for paresthesia, gait problems and weakness All other systems reviewed and are negative    Past Medical History:  Diagnosis Date  . Anemia   . Anxiety   . Chronic bronchitis ([place])   . Hypertension   . Morbid obesity ([place])   . Type II diabetes mellitus ([place])     Social History  Substance Use Topics  . Smoking status: Never Smoker  . Smokeless tobacco: Never Used  . Alcohol use No    Family History  Problem Relation Age of Onset  . Diabetes Mother   . Hypertension Mother   . Thyroid disease Mother   . Kidney disease Maternal Grandmother   . Diabetes Maternal Grandmother   . Heart attack Unknown     No Known Allergies  Physical Exam: Constitutional: in no apparent distress and alert  Vitals:   05/01/17 0206 05/01/17 0600  BP: (!) 160/77 (!) 161/84  Pulse: 97 99  Resp:  20  Temp:  98.4 F (36.9 C)  SpO2:  100%   EYES: anicteric ENMT: no thrush Cardiovascular: Cor RRR Respiratory: CTA B; normal respiratory effort GI: Bowel sounds are normal, liver is not enlarged, spleen is not enlarged Musculoskeletal: no pedal edema noted Skin: negatives: no rash Hematologic: no cervical lad  Lab Results  Component  Value Date   WBC 7.8 05/01/2017   HGB 9.4 (L) 05/01/2017   HCT 27.5 (L) 05/01/2017   MCV 82.6 05/01/2017   PLT 363 05/01/2017    Lab Results  Component Value Date   CREATININE 0.99 05/01/2017   BUN 13 05/01/2017   NA 138 05/01/2017   K 4.1 05/01/2017   CL 108 05/01/2017   CO2 23 05/01/2017    Lab Results  Component Value Date   ALT 13 (L) 04/30/2017   AST 20 04/30/2017   ALKPHOS 63 04/30/2017     Microbiology: Recent Results (from the past 240 hour(s))  Blood culture (routine x 2)     Status: None (Preliminary result)   Collection Time: 04/30/17 12:50 PM  Result Value Ref Range Status   Specimen  Description BLOOD LEFT ARM  Final   Special Requests   Final    BOTTLES DRAWN AEROBIC AND ANAEROBIC Blood Culture adequate volume   Culture   Final    NO GROWTH < 24 HOURS Performed at [facility], [place] [address], [place], [place] [address]    Report Status PENDING  Incomplete  Blood culture (routine x 2)     Status: None (Preliminary result)   Collection Time: 04/30/17  3:25 PM  Result Value Ref Range Status   Specimen Description BLOOD LEFT ARM  Final   Special Requests   Final    BOTTLES DRAWN AEROBIC AND ANAEROBIC Blood Culture adequate volume   Culture   Final    NO GROWTH < 24 HOURS Performed at [facility], [place] [address], [place], [place] [address]    Report Status PENDING  Incomplete    COMER, Herbie [place], [facility] [email or web address] [phone] pager  [phone] cell 05/01/2017, 12:21 PM

## 2017-05-01 NOTE — Care Management Note (Signed)
Case Management Note  Patient Details  Name: Jacqueline [name] MRN: [id number] Date of Birth: Jun 06, 1981  Subjective/Objective:                  Lower leg edema  Action/Plan: Date:  May 01, 2017 Chart reviewed for concurrent status and case management needs.  Will continue to follow patient progress.  Discharge Planning: following for needs  Expected discharge date: May 04, 2017  Velva Harman, BSN, [place], [place]   Expected Discharge Date:  05/02/17               Expected Discharge Plan:     In-House Referral:     Discharge planning Services     Post Acute Care Choice:    Choice offered to:     DME Arranged:    DME Agency:     HH Arranged:    [place] Agency:     Status of Service:     If discussed at [employer] of Stay Meetings, dates discussed:    Additional Comments:  Leeroy Cha, RN 05/01/2017, 10:05 AM

## 2017-05-01 NOTE — Progress Notes (Signed)
PROGRESS NOTE    Jacqueline [name]  WJX:[id number] DOB: 01/25/1981 DOA: 04/30/2017 PCP: Lin Landsman, MD    Brief Narrative:  36 y.o. female with medical history significant of chronic anemia, anxiety,chronic bronchitis, hypertension, morbid obesity, type 2 diabetes who is being transferred from [facility] after presenting there with 2-3 days of fever, associated with headache, nausea and photophobia. She complains of soreness in her neck since yesterday and had nausea with vomiting last night. There was some suspicion for meningitis and trial of lumbar puncture was done unsuccessfully. She had a similar episode last year in August. She denies travel history or sick contacts. She had breast reduction surgery on September 4, but denies any significant sequelae from it. She denies sore throat, productive cough, dyspnea, PND, chest pain, dizziness, palpitations, diaphoresis, but complains of frequent lower extremity edema. Frequent post-prandial RUQ pain and constipation (secondary to post-op hydromorphone use). No diarrhea, melena or hematochezia. Denies dysuria, frequency or hematuria.  She has been very thirsty lately, but denies polyuria.  Assessment & Plan:   Principal Problem:   Headache Active Problems:   HTN (hypertension)   Fever   Type II diabetes mellitus ([place])   Anemia  Principal Problem:   Headache Patient had been continued on IV fluids. MRI to brain unremarkable Patient seen by ID. Likely not meningitis ID recommendations to continue tx for migraine Will give trial of toradol, reglan, benadryl  Active Problems:   Fever Afebrile thus far without abx Continue IV fluids. Follow-up blood cultures and sensitivity.    HTN (hypertension) On avapro Would continue home benecar on discharge    Type II diabetes mellitus ([place]) Carbohydrate modified diet. CBG monitoring with regular insulin sliding scale. Stable at present    Anemia Iron within normal  limits Repeat cbc in AM  DVT prophylaxis: SCD's Code Status: Full Family Communication: Pt in room, family not at bedside Disposition Plan: Uncertain at this time  Consultants:   ID  Procedures:     Antimicrobials: Anti-infectives    None       Subjective: Complains of continued posterior neck pain with photophobia and phonophobia  Objective: Vitals:   04/30/17 2212 05/01/17 0206 05/01/17 0600 05/01/17 1418  BP: (!) 162/81 (!) 160/77 (!) 161/84 (!) 152/81  Pulse: 98 97 99 92  Resp: 20  20 20   Temp: 98.6 F (37 C)  98.4 F (36.9 C) 98.3 F (36.8 C)  TempSrc: Oral  Oral Oral  SpO2: 98%  100% 96%  Weight:      Height:        Intake/Output Summary (Last 24 hours) at 05/01/17 1556 Last data filed at 05/01/17 0600  Gross per 24 hour  Intake          1158.33 ml  Output                0 ml  Net          1158.33 ml   Filed Weights   04/30/17 1149 04/30/17 1806  Weight: 130.2 kg (287 lb) 131.1 kg (289 lb 1.6 oz)    Examination: General exam: Awake, laying in bed, in nad Respiratory system: Normal respiratory effort, no wheezing Cardiovascular system: regular rate, s1, s2 Gastrointestinal system: Soft, nondistended, positive BS Central nervous system: CN2-12 grossly intact, strength intact Extremities: Perfused, no clubbing Skin: Normal skin turgor, no notable skin lesions seen Psychiatry: Mood normal // no visual hallucinations   Data Reviewed: I have personally reviewed following labs and imaging  studies  CBC:  Recent Labs Lab 04/30/17 1230 05/01/17 0556  WBC 8.8 7.8  NEUTROABS 4.5 3.9  HGB 9.8* 9.4*  HCT 29.4* 27.5*  MCV 83.5 82.6  PLT 393 299   Basic Metabolic Panel:  Recent Labs Lab 04/30/17 1230 05/01/17 0556  NA 136 138  K 4.3 4.1  CL 107 108  CO2 23 23  GLUCOSE 291* 277*  BUN 14 13  CREATININE 0.92 0.99  CALCIUM 8.5* 8.2*   GFR: Estimated Creatinine Clearance: 104.8 mL/min (by C-G formula based on SCr of 0.99 mg/dL). Liver  Function Tests:  Recent Labs Lab 04/30/17 1230  AST 20  ALT 13*  ALKPHOS 63  BILITOT 0.4  PROT 7.0  ALBUMIN 3.0*   No results for input(s): LIPASE, AMYLASE in the last 168 hours. No results for input(s): AMMONIA in the last 168 hours. Coagulation Profile: No results for input(s): INR, PROTIME in the last 168 hours. Cardiac Enzymes: No results for input(s): CKTOTAL, CKMB, CKMBINDEX, TROPONINI in the last 168 hours. BNP (last 3 results) No results for input(s): PROBNP in the last 8760 hours. HbA1C:  Recent Labs  05/01/17 0955  HGBA1C 10.8*   CBG:  Recent Labs Lab 04/30/17 2209 05/01/17 0804 05/01/17 1140  GLUCAP 250* 254* 210*   Lipid Profile: No results for input(s): CHOL, HDL, LDLCALC, TRIG, CHOLHDL, LDLDIRECT in the last 72 hours. Thyroid Function Tests: No results for input(s): TSH, T4TOTAL, FREET4, T3FREE, THYROIDAB in the last 72 hours. Anemia Panel:  Recent Labs  05/01/17 0556  VITAMINB12 344  FOLATE 10.3  FERRITIN 50  TIBC 224*  IRON 46  RETICCTPCT 2.8   Sepsis Labs:  Recent Labs Lab 04/30/17 1327 04/30/17 1540  LATICACIDVEN 1.80 1.89    Recent Results (from the past 240 hour(s))  Blood culture (routine x 2)     Status: None (Preliminary result)   Collection Time: 04/30/17 12:50 PM  Result Value Ref Range Status   Specimen Description BLOOD LEFT ARM  Final   Special Requests   Final    BOTTLES DRAWN AEROBIC AND ANAEROBIC Blood Culture adequate volume   Culture   Final    NO GROWTH < 24 HOURS Performed at [facility], [place] [address] [place], [place] [address]    Report Status PENDING  Incomplete  Blood culture (routine x 2)     Status: None (Preliminary result)   Collection Time: 04/30/17  3:25 PM  Result Value Ref Range Status   Specimen Description BLOOD LEFT ARM  Final   Special Requests   Final    BOTTLES DRAWN AEROBIC AND ANAEROBIC Blood Culture adequate volume   Culture   Final    NO GROWTH < 24 HOURS Performed at  [facility], [place] [address] [place], [place] [address]    Report Status PENDING  Incomplete     Radiology Studies: Dg Chest 2 View  Result Date: 04/30/2017 CLINICAL DATA:  Fever, headache, nausea/vomiting EXAM: CHEST  2 VIEW COMPARISON:  01/31/2017 FINDINGS: Lungs are clear.  No pleural effusion or pneumothorax. The heart is normal in size. Mild degenerative changes of the visualized thoracolumbar spine. IMPRESSION: Normal chest radiographs. Electronically Signed   By: Julian Hy M.D.   On: 04/30/2017 13:17   Ct Head Wo Contrast  Result Date: 04/30/2017 CLINICAL DATA:  Headache EXAM: CT HEAD WITHOUT CONTRAST TECHNIQUE: Contiguous axial images were obtained from the base of the skull through the vertex without intravenous contrast. COMPARISON:  MRI brain dated  03/14/2016 FINDINGS: Brain: No evidence of acute infarction, hemorrhage, hydrocephalus, extra-axial collection or mass lesion/mass effect. Vascular: No hyperdense vessel or unexpected calcification. Skull: Normal. Negative for fracture or focal lesion. Sinuses/Orbits: The visualized paranasal sinuses are essentially clear. The mastoid air cells are unopacified. Other: None. IMPRESSION: Normal head CT. Electronically Signed   By: Julian Hy M.D.   On: 04/30/2017 13:16   Mr Brain Wo Contrast  Result Date: 05/01/2017 CLINICAL DATA:  Rule out meningitis or encephalitis. Headache and stiff neck. EXAM: MRI HEAD WITHOUT CONTRAST TECHNIQUE: Multiplanar, multiecho pulse sequences of the brain and surrounding structures were obtained without intravenous contrast. COMPARISON:  03/14/2016 FINDINGS: Brain: No infarction, hemorrhage, hydrocephalus, extra-axial collection or mass lesion. No evidence of sulcal or intraventricular debris. No atrophy or white matter disease. Vascular: Major flow voids are preserved. Skull and upper cervical spine: Negative for marrow lesion Sinuses/Orbits: Negative IMPRESSION: Normal brain MRI. A normal  study does not exclude the diagnosis of meningitis. Electronically Signed   By: Monte Fantasia M.D.   On: 05/01/2017 07:12    Scheduled Meds: . famotidine  20 mg Oral BID  . hydrochlorothiazide  25 mg Oral Daily  . Influenza vac split quadrivalent PF  0.5 mL Intramuscular Tomorrow-1000  . insulin aspart  0-20 Units Subcutaneous TID WC  . insulin aspart  3 Units Subcutaneous TID WC  . insulin glargine  15 Units Subcutaneous Daily  . irbesartan  150 mg Oral Daily   Continuous Infusions:   LOS: 0 days   Makaela Cando, Orpah Melter, MD [facility] Pager [phone]  If 7PM-7AM, please contact night-coverage [email or web address] Password [email or web address] 05/01/2017, 3:56 PM

## 2017-05-02 DIAGNOSIS — Z833 Family history of diabetes mellitus: Secondary | ICD-10-CM

## 2017-05-02 DIAGNOSIS — E0800 Diabetes mellitus due to underlying condition with hyperosmolarity without nonketotic hyperglycemic-hyperosmolar coma (NKHHC): Secondary | ICD-10-CM | POA: Diagnosis not present

## 2017-05-02 DIAGNOSIS — Z6841 Body Mass Index (BMI) 40.0 and over, adult: Secondary | ICD-10-CM

## 2017-05-02 DIAGNOSIS — A419 Sepsis, unspecified organism: Principal | ICD-10-CM | POA: Diagnosis present

## 2017-05-02 DIAGNOSIS — R51 Headache: Secondary | ICD-10-CM | POA: Diagnosis not present

## 2017-05-02 DIAGNOSIS — E119 Type 2 diabetes mellitus without complications: Secondary | ICD-10-CM | POA: Diagnosis present

## 2017-05-02 DIAGNOSIS — Z79899 Other long term (current) drug therapy: Secondary | ICD-10-CM

## 2017-05-02 DIAGNOSIS — Z8249 Family history of ischemic heart disease and other diseases of the circulatory system: Secondary | ICD-10-CM

## 2017-05-02 DIAGNOSIS — N390 Urinary tract infection, site not specified: Secondary | ICD-10-CM | POA: Diagnosis present

## 2017-05-02 DIAGNOSIS — R000 Tachycardia, unspecified: Secondary | ICD-10-CM | POA: Diagnosis present

## 2017-05-02 DIAGNOSIS — E871 Hypo-osmolality and hyponatremia: Secondary | ICD-10-CM | POA: Diagnosis present

## 2017-05-02 DIAGNOSIS — R197 Diarrhea, unspecified: Secondary | ICD-10-CM | POA: Diagnosis present

## 2017-05-02 DIAGNOSIS — I10 Essential (primary) hypertension: Secondary | ICD-10-CM | POA: Diagnosis not present

## 2017-05-02 DIAGNOSIS — Z794 Long term (current) use of insulin: Secondary | ICD-10-CM

## 2017-05-02 DIAGNOSIS — N179 Acute kidney failure, unspecified: Secondary | ICD-10-CM | POA: Diagnosis present

## 2017-05-02 DIAGNOSIS — F419 Anxiety disorder, unspecified: Secondary | ICD-10-CM | POA: Diagnosis present

## 2017-05-02 DIAGNOSIS — J42 Unspecified chronic bronchitis: Secondary | ICD-10-CM | POA: Diagnosis present

## 2017-05-02 DIAGNOSIS — D649 Anemia, unspecified: Secondary | ICD-10-CM | POA: Diagnosis present

## 2017-05-02 DIAGNOSIS — M436 Torticollis: Secondary | ICD-10-CM | POA: Diagnosis present

## 2017-05-02 DIAGNOSIS — E872 Acidosis: Secondary | ICD-10-CM | POA: Diagnosis present

## 2017-05-02 LAB — CBC
HCT: 25.6 % — ABNORMAL LOW (ref 36.0–46.0)
Hemoglobin: 8.8 g/dL — ABNORMAL LOW (ref 12.0–15.0)
MCH: 28.2 pg (ref 26.0–34.0)
MCHC: 34.4 g/dL (ref 30.0–36.0)
MCV: 82.1 fL (ref 78.0–100.0)
PLATELETS: 326 10*3/uL (ref 150–400)
RBC: 3.12 MIL/uL — ABNORMAL LOW (ref 3.87–5.11)
RDW: 13.6 % (ref 11.5–15.5)
WBC: 7.5 10*3/uL (ref 4.0–10.5)

## 2017-05-02 LAB — BASIC METABOLIC PANEL
Anion gap: 9 (ref 5–15)
BUN: 14 mg/dL (ref 6–20)
CALCIUM: 8 mg/dL — AB (ref 8.9–10.3)
CO2: 23 mmol/L (ref 22–32)
CREATININE: 0.96 mg/dL (ref 0.44–1.00)
Chloride: 104 mmol/L (ref 101–111)
GLUCOSE: 239 mg/dL — AB (ref 65–99)
Potassium: 3.9 mmol/L (ref 3.5–5.1)
Sodium: 136 mmol/L (ref 135–145)

## 2017-05-02 LAB — GLUCOSE, CAPILLARY
GLUCOSE-CAPILLARY: 264 mg/dL — AB (ref 65–99)
GLUCOSE-CAPILLARY: 195 mg/dL — AB (ref 65–99)
Glucose-Capillary: 140 mg/dL — ABNORMAL HIGH (ref 65–99)

## 2017-05-02 MED ORDER — INSULIN ASPART 100 UNIT/ML ~~LOC~~ SOLN: 5.0000 [IU] | Freq: Three times a day (TID) | SUBCUTANEOUS | 0 refills | Status: DC

## 2017-05-02 MED ORDER — METHOCARBAMOL 750 MG PO TABS: 750.0000 mg | ORAL_TABLET | Freq: Four times a day (QID) | ORAL | 0 refills | Status: DC | PRN

## 2017-05-02 MED ORDER — METOCLOPRAMIDE HCL 5 MG/ML IJ SOLN
5.0000 mg | Freq: Once | INTRAMUSCULAR | Status: AC
  Administered 2017-05-02: 5 mg via INTRAVENOUS
  Filled 2017-05-02 (×2): qty 2

## 2017-05-02 MED ORDER — INSULIN GLARGINE 100 UNIT/ML SOLOSTAR PEN: 20.0000 [IU] | PEN_INJECTOR | Freq: Every day | SUBCUTANEOUS | 0 refills | Status: DC

## 2017-05-02 MED ORDER — INSULIN GLARGINE 100 UNIT/ML ~~LOC~~ SOLN: 20.0000 [IU] | Freq: Every day | SUBCUTANEOUS | Status: DC

## 2017-05-02 MED ORDER — KETOROLAC TROMETHAMINE 30 MG/ML IJ SOLN: 30.0000 mg | Freq: Four times a day (QID) | INTRAMUSCULAR | Status: DC | PRN

## 2017-05-02 MED ORDER — PROPRANOLOL HCL 20 MG PO TABS: 20.0000 mg | ORAL_TABLET | Freq: Two times a day (BID) | ORAL | 0 refills | Status: DC

## 2017-05-02 MED ORDER — KETOROLAC TROMETHAMINE 30 MG/ML IJ SOLN
30.0000 mg | Freq: Once | INTRAMUSCULAR | Status: AC
  Administered 2017-05-02: 30 mg via INTRAVENOUS
  Filled 2017-05-02 (×2): qty 1

## 2017-05-02 MED ORDER — LIVING WELL WITH DIABETES BOOK
Freq: Once | Status: AC
  Administered 2017-05-02: 17:00:00
  Filled 2017-05-02: qty 1

## 2017-05-02 MED ORDER — INSULIN ASPART 100 UNIT/ML ~~LOC~~ SOLN: 5.0000 [IU] | Freq: Three times a day (TID) | SUBCUTANEOUS | Status: DC

## 2017-05-02 MED ORDER — PROPRANOLOL HCL 20 MG PO TABS
20.0000 mg | ORAL_TABLET | Freq: Two times a day (BID) | ORAL | Status: DC
  Administered 2017-05-02: 20 mg via ORAL
  Filled 2017-05-02: qty 1

## 2017-05-02 MED ORDER — KETOROLAC TROMETHAMINE 10 MG PO TABS: 10.0000 mg | ORAL_TABLET | Freq: Four times a day (QID) | ORAL | 0 refills | Status: DC | PRN

## 2017-05-02 MED ORDER — DIPHENHYDRAMINE HCL 50 MG/ML IJ SOLN
25.0000 mg | Freq: Once | INTRAMUSCULAR | Status: AC
  Administered 2017-05-02: 25 mg via INTRAVENOUS
  Filled 2017-05-02 (×2): qty 1

## 2017-05-02 NOTE — Progress Notes (Signed)
Patient given discharge instructions, and verbalized an understanding of all discharge instructions.  Patient agrees with discharge plan, and is being discharged in stable medical condition.  Patient given transportation via wheelchair. 

## 2017-05-02 NOTE — Progress Notes (Signed)
Inpatient Diabetes Program Recommendations  AACE/ADA: New Consensus Statement on Inpatient Glycemic Control (2015)  Target Ranges:  Prepandial:   less than 140 mg/dL      Peak postprandial:   less than 180 mg/dL (1-2 hours)      Critically ill patients:  140 - 180 mg/dL   Lab Results  Component Value Date   GLUCAP 264 (H) 05/02/2017   HGBA1C 10.8 (H) 05/01/2017    Review of Glycemic Control  Diabetes history: DM2 Outpatient Diabetes medications: Novolog 10 units tidwc Current orders for Inpatient glycemic control: Lantus 15 units QD, Novolog 0-20 units tidwc + 3 units tidwc  HgbA1C 10.8% - pt states this is coming down. Previously on Basaglar insulin, pt states MD took her off of this because her blood sugars were improved.  Inpatient Diabetes Program Recommendations:    OP Diabetes Education consult (pt agreed to attend) Will order Living Well with Diabetes book Increase Lantus to 20 units QD Increase Novolog to 5 units tidwc Add HS correction.  Spoke with pt about her HgbA1C. States it is improving. Discussed how diet, exercise and stress management play a role in controlling blood sugars. Stressed importance of portion control, glucose monitoring and f/u with PCP for diabetes management.  Discussed with RN.  Thank you. Lorenda Peck, RD, LDN, CDE Inpatient Diabetes Coordinator [phone]

## 2017-05-02 NOTE — Progress Notes (Signed)
Pt administered 1200 insulin dose to abdomen without complication and demonstrated competency.

## 2017-05-02 NOTE — Discharge Summary (Signed)
Physician Discharge Summary  Jacqueline [name] AVW:[id number] DOB: 1980/09/14 DOA: 04/30/2017  PCP: Lin Landsman, MD  Admit date: 04/30/2017 Discharge date: 05/02/2017  Admitted From: Home Disposition:  Home  Recommendations for Outpatient Follow-up:  1. Follow up with PCP in 1-2 weeks 2. Consider follow up with Neurology for management of headache  Discharge Condition:Improved CODE STATUS:Full Diet recommendation: Diabetic   Brief/Interim Summary: 36 y.o.femalewith medical history significant of chronic anemia, anxiety,chronic bronchitis, hypertension, morbid obesity, type 2 diabetes who is being transferred from [facility] after presenting there with 2-3 days of fever, associated with headache, nausea and photophobia. She complains of soreness in her neck since yesterday and had nausea with vomiting last night. There was some suspicion for meningitis and trial of lumbar puncture was done unsuccessfully. She had a similar episode last year in August. She denies travel history or sick contacts. She had breast reduction surgery on September 4, but denies any significant sequelae from it. She denies sore throat, productive cough, dyspnea, PND, chest pain, dizziness, palpitations, diaphoresis, but complains of frequent lower extremity edema. Frequent post-prandial RUQ pain and constipation (secondary to post-op hydromorphone use). No diarrhea, melena or hematochezia. Denies dysuria, frequency or hematuria. She has been very thirsty lately, but denies polyuria.  Discharge Diagnoses:  Principal Problem:   Headache Active Problems:   HTN (hypertension)   Fever   Type II diabetes mellitus ([place])   Anemia  Principal Problem: Headache Patient had been continued on IV fluids. MRI of brain unremarkable Patient seen by ID. Likely not meningitis, droplet precautions were discontinued ID recommendations to continue tx for migraine Improvement noted with trial of toradol, reglan,  benadryl  Active Problems: Fever Remained afebrile without antibiotics  HTN (hypertension) On avapro Continue home benecar on discharge  Type II diabetes mellitus ([place]) Carbohydrate modified diet. CBG monitoring with regular insulin sliding scale. Pt's glucose noted to the in the 200's. Add 20 units lantus with 5 units premeal insulin coverage  Anemia Iron within normal limits  Discharge Instructions  Discharge Instructions    Ambulatory referral to Nutrition and Diabetic Education    Complete by:  As directed      Allergies as of 05/02/2017   No Known Allergies     Medication List    STOP taking these medications   cyclobenzaprine 10 MG tablet Commonly known as:  FLEXERIL   docusate sodium 100 MG capsule Commonly known as:  COLACE   HYDROcodone-acetaminophen 5-325 MG tablet Commonly known as:  NORCO/VICODIN   HYDROmorphone 2 MG tablet Commonly known as:  DILAUDID   losartan 100 MG tablet Commonly known as:  COZAAR   pantoprazole 40 MG tablet Commonly known as:  PROTONIX     TAKE these medications   acetaminophen 500 MG tablet Commonly known as:  TYLENOL Take 1 tablet (500 mg total) by mouth every 6 (six) hours as needed. What changed:  Another medication with the same name was removed. Continue taking this medication, and follow the directions you see here.   ibuprofen 200 MG tablet Commonly known as:  ADVIL,MOTRIN Take 400 mg by mouth every 6 (six) hours as needed for fever or moderate pain. What changed:  Another medication with the same name was removed. Continue taking this medication, and follow the directions you see here.   insulin aspart 100 UNIT/ML injection Commonly known as:  novoLOG Inject 5 Units into the skin 3 (three) times daily with meals. What changed:  how much to take  when to take  this   Insulin Glargine 100 UNIT/ML Solostar Pen Commonly known as:  LANTUS Inject 20 Units into the skin daily at 10 pm.    ketorolac 10 MG tablet Commonly known as:  TORADOL Take 1 tablet (10 mg total) by mouth every 6 (six) hours as needed.   methocarbamol 750 MG tablet Commonly known as:  ROBAXIN Take 1 tablet (750 mg total) by mouth every 6 (six) hours as needed for muscle spasms.   olmesartan-hydrochlorothiazide 40-25 MG tablet Commonly known as:  BENICAR HCT Take 1 tablet by mouth daily.   propranolol 20 MG tablet Commonly known as:  INDERAL Take 1 tablet (20 mg total) by mouth 2 (two) times daily.      Follow-up Information    Lin Landsman, MD. Schedule an appointment as soon as possible for a visit in 1 week(s).   Specialty:  Family Medicine Contact information: [place] [address] [phone]          No Known Allergies    Procedures/Studies: Dg Chest 2 View  Result Date: 04/30/2017 CLINICAL DATA:  Fever, headache, nausea/vomiting EXAM: CHEST  2 VIEW COMPARISON:  01/31/2017 FINDINGS: Lungs are clear.  No pleural effusion or pneumothorax. The heart is normal in size. Mild degenerative changes of the visualized thoracolumbar spine. IMPRESSION: Normal chest radiographs. Electronically Signed   By: Julian Hy M.D.   On: 04/30/2017 13:17   Ct Head Wo Contrast  Result Date: 04/30/2017 CLINICAL DATA:  Headache EXAM: CT HEAD WITHOUT CONTRAST TECHNIQUE: Contiguous axial images were obtained from the base of the skull through the vertex without intravenous contrast. COMPARISON:  MRI brain dated 03/14/2016 FINDINGS: Brain: No evidence of acute infarction, hemorrhage, hydrocephalus, extra-axial collection or mass lesion/mass effect. Vascular: No hyperdense vessel or unexpected calcification. Skull: Normal. Negative for fracture or focal lesion. Sinuses/Orbits: The visualized paranasal sinuses are essentially clear. The mastoid air cells are unopacified. Other: None. IMPRESSION: Normal head CT. Electronically Signed   By: Julian Hy M.D.   On: 04/30/2017 13:16    Mr Brain Wo Contrast  Result Date: 05/01/2017 CLINICAL DATA:  Rule out meningitis or encephalitis. Headache and stiff neck. EXAM: MRI HEAD WITHOUT CONTRAST TECHNIQUE: Multiplanar, multiecho pulse sequences of the brain and surrounding structures were obtained without intravenous contrast. COMPARISON:  03/14/2016 FINDINGS: Brain: No infarction, hemorrhage, hydrocephalus, extra-axial collection or mass lesion. No evidence of sulcal or intraventricular debris. No atrophy or white matter disease. Vascular: Major flow voids are preserved. Skull and upper cervical spine: Negative for marrow lesion Sinuses/Orbits: Negative IMPRESSION: Normal brain MRI. A normal study does not exclude the diagnosis of meningitis. Electronically Signed   By: Monte Fantasia M.D.   On: 05/01/2017 07:12    Subjective: Headache improved  Discharge Exam: Vitals:   05/01/17 2128 05/02/17 0539  BP: (!) 171/82 (!) 150/82  Pulse: 97 94  Resp: 16 18  Temp: 98.7 F (37.1 C) 98.3 F (36.8 C)  SpO2: 98% 98%   Vitals:   05/01/17 0600 05/01/17 1418 05/01/17 2128 05/02/17 0539  BP: (!) 161/84 (!) 152/81 (!) 171/82 (!) 150/82  Pulse: 99 92 97 94  Resp: 20 20 16 18   Temp: 98.4 F (36.9 C) 98.3 F (36.8 C) 98.7 F (37.1 C) 98.3 F (36.8 C)  TempSrc: Oral Oral Oral Oral  SpO2: 100% 96% 98% 98%  Weight:      Height:        General: Pt is alert, awake, not in acute distress Cardiovascular: RRR, S1/S2 +,  no rubs, no gallops Respiratory: CTA bilaterally, no wheezing, no rhonchi Abdominal: Soft, NT, ND, bowel sounds + Extremities: no edema, no cyanosis   The results of significant diagnostics from this hospitalization (including imaging, microbiology, ancillary and laboratory) are listed below for reference.     Microbiology: Recent Results (from the past 240 hour(s))  Blood culture (routine x 2)     Status: None (Preliminary result)   Collection Time: 04/30/17 12:50 PM  Result Value Ref Range Status   Specimen  Description BLOOD LEFT ARM  Final   Special Requests   Final    BOTTLES DRAWN AEROBIC AND ANAEROBIC Blood Culture adequate volume   Culture   Final    NO GROWTH 2 DAYS Performed at [facility], [address] [place], [place] [address]    Report Status PENDING  Incomplete  Blood culture (routine x 2)     Status: None (Preliminary result)   Collection Time: 04/30/17  3:25 PM  Result Value Ref Range Status   Specimen Description BLOOD LEFT ARM  Final   Special Requests   Final    BOTTLES DRAWN AEROBIC AND ANAEROBIC Blood Culture adequate volume   Culture   Final    NO GROWTH 2 DAYS Performed at [facility], [place] [address], [place], [place] [address]    Report Status PENDING  Incomplete     Labs: BNP (last 3 results) No results for input(s): BNP in the last 8760 hours. Basic Metabolic Panel:  Recent Labs Lab 04/30/17 1230 05/01/17 0556 05/02/17 0559  NA 136 138 136  K 4.3 4.1 3.9  CL 107 108 104  CO2 23 23 23   GLUCOSE 291* 277* 239*  BUN 14 13 14   CREATININE 0.92 0.99 0.96  CALCIUM 8.5* 8.2* 8.0*   Liver Function Tests:  Recent Labs Lab 04/30/17 1230  AST 20  ALT 13*  ALKPHOS 63  BILITOT 0.4  PROT 7.0  ALBUMIN 3.0*   No results for input(s): LIPASE, AMYLASE in the last 168 hours. No results for input(s): AMMONIA in the last 168 hours. CBC:  Recent Labs Lab 04/30/17 1230 05/01/17 0556 05/02/17 0559  WBC 8.8 7.8 7.5  NEUTROABS 4.5 3.9  --   HGB 9.8* 9.4* 8.8*  HCT 29.4* 27.5* 25.6*  MCV 83.5 82.6 82.1  PLT 393 363 326   Cardiac Enzymes: No results for input(s): CKTOTAL, CKMB, CKMBINDEX, TROPONINI in the last 168 hours. BNP: Invalid input(s): POCBNP CBG:  Recent Labs Lab 05/01/17 1701 05/01/17 2143 05/02/17 0816 05/02/17 1136 05/02/17 1647  GLUCAP 189* 174* 264* 195* 140*   D-Dimer No results for input(s): DDIMER in the last 72 hours. Hgb A1c  Recent Labs  05/01/17 0955  HGBA1C 10.8*   Lipid Profile No results for  input(s): CHOL, HDL, LDLCALC, TRIG, CHOLHDL, LDLDIRECT in the last 72 hours. Thyroid function studies No results for input(s): TSH, T4TOTAL, T3FREE, THYROIDAB in the last 72 hours.  Invalid input(s): FREET3 Anemia work up  Recent Labs  05/01/17 0556  VITAMINB12 344  FOLATE 10.3  FERRITIN 50  TIBC 224*  IRON 46  RETICCTPCT 2.8   Urinalysis    Component Value Date/Time   COLORURINE STRAW (A) 04/30/2017 1240   APPEARANCEUR CLOUDY (A) 04/30/2017 1240   LABSPEC 1.025 04/30/2017 1240   PHURINE 6.0 04/30/2017 1240   GLUCOSEU >=500 (A) 04/30/2017 1240   HGBUR MODERATE (A) 04/30/2017 1240   BILIRUBINUR NEGATIVE 04/30/2017 [place] 04/30/2017 1240  PROTEINUR 100 (A) 04/30/2017 1240   UROBILINOGEN 0.2 08/21/2012 2130   NITRITE NEGATIVE 04/30/2017 1240   LEUKOCYTESUR TRACE (A) 04/30/2017 1240   Sepsis Labs Invalid input(s): PROCALCITONIN,  WBC,  LACTICIDVEN Microbiology Recent Results (from the past 240 hour(s))  Blood culture (routine x 2)     Status: None (Preliminary result)   Collection Time: 04/30/17 12:50 PM  Result Value Ref Range Status   Specimen Description BLOOD LEFT ARM  Final   Special Requests   Final    BOTTLES DRAWN AEROBIC AND ANAEROBIC Blood Culture adequate volume   Culture   Final    NO GROWTH 2 DAYS Performed at [facility], [place] [address], [place], [place] [address]    Report Status PENDING  Incomplete  Blood culture (routine x 2)     Status: None (Preliminary result)   Collection Time: 04/30/17  3:25 PM  Result Value Ref Range Status   Specimen Description BLOOD LEFT ARM  Final   Special Requests   Final    BOTTLES DRAWN AEROBIC AND ANAEROBIC Blood Culture adequate volume   Culture   Final    NO GROWTH 2 DAYS Performed at [facility], [place] [address], [place], [place] [address]    Report Status PENDING  Incomplete     SIGNED:   Keosha Rossa, Orpah Melter, MD  [facility] 05/02/2017, 5:13 PM  If 7PM-7AM, please  contact night-coverage [email or web address] Password [email or web address]

## 2017-05-03 ENCOUNTER — Inpatient Hospital Stay (HOSPITAL_COMMUNITY)
Admission: EM | Admit: 2017-05-03 | Discharge: 2017-05-05 | DRG: 872 | Disposition: A | Discharge status: Home / Self Care | Payer: BLUE CROSS/BLUE SHIELD | Attending: Internal Medicine | Admitting: Internal Medicine

## 2017-05-03 ENCOUNTER — Emergency Department (HOSPITAL_COMMUNITY): Payer: BLUE CROSS/BLUE SHIELD

## 2017-05-03 ENCOUNTER — Inpatient Hospital Stay (HOSPITAL_COMMUNITY): Payer: BLUE CROSS/BLUE SHIELD

## 2017-05-03 ENCOUNTER — Encounter (HOSPITAL_COMMUNITY): Payer: Self-pay | Admitting: *Deleted

## 2017-05-03 DIAGNOSIS — Z79899 Other long term (current) drug therapy: Secondary | ICD-10-CM | POA: Diagnosis not present

## 2017-05-03 DIAGNOSIS — Z8249 Family history of ischemic heart disease and other diseases of the circulatory system: Secondary | ICD-10-CM | POA: Diagnosis not present

## 2017-05-03 DIAGNOSIS — R197 Diarrhea, unspecified: Secondary | ICD-10-CM | POA: Diagnosis present

## 2017-05-03 DIAGNOSIS — N390 Urinary tract infection, site not specified: Secondary | ICD-10-CM | POA: Diagnosis present

## 2017-05-03 DIAGNOSIS — R519 Headache, unspecified: Secondary | ICD-10-CM | POA: Diagnosis present

## 2017-05-03 DIAGNOSIS — M436 Torticollis: Secondary | ICD-10-CM | POA: Diagnosis present

## 2017-05-03 DIAGNOSIS — D649 Anemia, unspecified: Secondary | ICD-10-CM | POA: Diagnosis present

## 2017-05-03 DIAGNOSIS — R509 Fever, unspecified: Secondary | ICD-10-CM | POA: Diagnosis present

## 2017-05-03 DIAGNOSIS — N183 Chronic kidney disease, stage 3 unspecified: Secondary | ICD-10-CM | POA: Diagnosis present

## 2017-05-03 DIAGNOSIS — E872 Acidosis, unspecified: Secondary | ICD-10-CM | POA: Insufficient documentation

## 2017-05-03 DIAGNOSIS — D6489 Other specified anemias: Secondary | ICD-10-CM | POA: Diagnosis not present

## 2017-05-03 DIAGNOSIS — R51 Headache: Secondary | ICD-10-CM

## 2017-05-03 DIAGNOSIS — R5081 Fever presenting with conditions classified elsewhere: Secondary | ICD-10-CM | POA: Diagnosis not present

## 2017-05-03 DIAGNOSIS — E1129 Type 2 diabetes mellitus with other diabetic kidney complication: Secondary | ICD-10-CM

## 2017-05-03 DIAGNOSIS — E871 Hypo-osmolality and hyponatremia: Secondary | ICD-10-CM | POA: Diagnosis present

## 2017-05-03 DIAGNOSIS — J42 Unspecified chronic bronchitis: Secondary | ICD-10-CM | POA: Diagnosis present

## 2017-05-03 DIAGNOSIS — M542 Cervicalgia: Secondary | ICD-10-CM | POA: Insufficient documentation

## 2017-05-03 DIAGNOSIS — Z833 Family history of diabetes mellitus: Secondary | ICD-10-CM | POA: Diagnosis not present

## 2017-05-03 DIAGNOSIS — N179 Acute kidney failure, unspecified: Secondary | ICD-10-CM | POA: Diagnosis present

## 2017-05-03 DIAGNOSIS — F419 Anxiety disorder, unspecified: Secondary | ICD-10-CM | POA: Diagnosis present

## 2017-05-03 DIAGNOSIS — Z794 Long term (current) use of insulin: Secondary | ICD-10-CM | POA: Diagnosis not present

## 2017-05-03 DIAGNOSIS — E1100 Type 2 diabetes mellitus with hyperosmolarity without nonketotic hyperglycemic-hyperosmolar coma (NKHHC): Secondary | ICD-10-CM | POA: Diagnosis not present

## 2017-05-03 DIAGNOSIS — I10 Essential (primary) hypertension: Secondary | ICD-10-CM | POA: Diagnosis present

## 2017-05-03 DIAGNOSIS — R000 Tachycardia, unspecified: Secondary | ICD-10-CM | POA: Diagnosis present

## 2017-05-03 DIAGNOSIS — A419 Sepsis, unspecified organism: Secondary | ICD-10-CM | POA: Diagnosis present

## 2017-05-03 DIAGNOSIS — E119 Type 2 diabetes mellitus without complications: Secondary | ICD-10-CM | POA: Diagnosis present

## 2017-05-03 DIAGNOSIS — Z6841 Body Mass Index (BMI) 40.0 and over, adult: Secondary | ICD-10-CM | POA: Diagnosis not present

## 2017-05-03 HISTORY — DX: Headache, unspecified: R51.9

## 2017-05-03 HISTORY — DX: Headache: R51

## 2017-05-03 LAB — CSF CELL COUNT WITH DIFFERENTIAL
RBC Count, CSF: 51 /mm3 — ABNORMAL HIGH
Tube #: 1
WBC, CSF: 3 /mm3 (ref 0–5)

## 2017-05-03 LAB — COMPREHENSIVE METABOLIC PANEL
ALBUMIN: 3 g/dL — AB (ref 3.5–5.0)
ALK PHOS: 71 U/L (ref 38–126)
ALT: 18 U/L (ref 14–54)
AST: 28 U/L (ref 15–41)
Anion gap: 10 (ref 5–15)
BUN: 19 mg/dL (ref 6–20)
CALCIUM: 8.3 mg/dL — AB (ref 8.9–10.3)
CO2: 20 mmol/L — AB (ref 22–32)
CREATININE: 1.4 mg/dL — AB (ref 0.44–1.00)
Chloride: 104 mmol/L (ref 101–111)
GFR calc Af Amer: 55 mL/min — ABNORMAL LOW (ref >60)
GFR calc non Af Amer: 47 mL/min — ABNORMAL LOW (ref >60)
GLUCOSE: 261 mg/dL — AB (ref 65–99)
Potassium: 3.9 mmol/L (ref 3.5–5.1)
SODIUM: 134 mmol/L — AB (ref 135–145)
Total Bilirubin: 0.8 mg/dL (ref 0.3–1.2)
Total Protein: 6.4 g/dL — ABNORMAL LOW (ref 6.5–8.1)

## 2017-05-03 LAB — CBC WITH DIFFERENTIAL/PLATELET
BASOS ABS: 0 10*3/uL (ref 0.0–0.1)
Basophils Relative: 0 %
EOS PCT: 1 %
Eosinophils Absolute: 0.2 10*3/uL (ref 0.0–0.7)
HEMATOCRIT: 29.1 % — AB (ref 36.0–46.0)
Hemoglobin: 9.7 g/dL — ABNORMAL LOW (ref 12.0–15.0)
LYMPHS ABS: 1.8 10*3/uL (ref 0.7–4.0)
LYMPHS PCT: 15 %
MCH: 27.6 pg (ref 26.0–34.0)
MCHC: 33.3 g/dL (ref 30.0–36.0)
MCV: 82.7 fL (ref 78.0–100.0)
MONO ABS: 0.5 10*3/uL (ref 0.1–1.0)
MONOS PCT: 4 %
Neutro Abs: 9.8 10*3/uL — ABNORMAL HIGH (ref 1.7–7.7)
Neutrophils Relative %: 80 %
PLATELETS: 350 10*3/uL (ref 150–400)
RBC: 3.52 MIL/uL — ABNORMAL LOW (ref 3.87–5.11)
RDW: 13.6 % (ref 11.5–15.5)
WBC: 12.2 10*3/uL — AB (ref 4.0–10.5)

## 2017-05-03 LAB — URINALYSIS, ROUTINE W REFLEX MICROSCOPIC
Bilirubin Urine: NEGATIVE
Glucose, UA: 50 mg/dL — AB
Ketones, ur: NEGATIVE mg/dL
Nitrite: NEGATIVE
Protein, ur: 100 mg/dL — AB
Specific Gravity, Urine: 1.02 (ref 1.005–1.030)
pH: 5 (ref 5.0–8.0)

## 2017-05-03 LAB — I-STAT CG4 LACTIC ACID, ED
Lactic Acid, Venous: 3.5 mmol/L (ref 0.5–1.9)
Lactic Acid, Venous: 1.18 mmol/L (ref 0.5–1.9)
Lactic Acid, Venous: 0.75 mmol/L (ref 0.5–1.9)

## 2017-05-03 LAB — GLUCOSE, CSF: GLUCOSE CSF: 128 mg/dL — AB (ref 40–70)

## 2017-05-03 LAB — PROTIME-INR
INR: 1.1
Prothrombin Time: 14.2 seconds (ref 11.4–15.2)

## 2017-05-03 LAB — APTT: aPTT: 26 seconds (ref 24–36)

## 2017-05-03 LAB — PROTEIN, CSF: TOTAL PROTEIN, CSF: 26 mg/dL (ref 15–45)

## 2017-05-03 LAB — PROCALCITONIN: Procalcitonin: 1.82 ng/mL

## 2017-05-03 LAB — LACTIC ACID, PLASMA: Lactic Acid, Venous: 1 mmol/L (ref 0.5–1.9)

## 2017-05-03 MED ORDER — SODIUM CHLORIDE 0.9 % IV SOLN
INTRAVENOUS | Status: DC
  Administered 2017-05-04 – 2017-05-05 (×3): via INTRAVENOUS

## 2017-05-03 MED ORDER — SODIUM CHLORIDE 0.9 % IV BOLUS (SEPSIS)
1000.0000 mL | Freq: Once | INTRAVENOUS | Status: AC
  Administered 2017-05-03: 1000 mL via INTRAVENOUS

## 2017-05-03 MED ORDER — KETOROLAC TROMETHAMINE 30 MG/ML IJ SOLN
30.0000 mg | Freq: Four times a day (QID) | INTRAMUSCULAR | Status: AC | PRN
  Administered 2017-05-04 (×2): 30 mg via INTRAVENOUS
  Filled 2017-05-03 (×2): qty 1

## 2017-05-03 MED ORDER — ACETAMINOPHEN 500 MG PO TABS
500.0000 mg | ORAL_TABLET | Freq: Four times a day (QID) | ORAL | Status: DC | PRN
  Administered 2017-05-03 – 2017-05-04 (×2): 500 mg via ORAL
  Filled 2017-05-03 (×2): qty 1

## 2017-05-03 MED ORDER — INSULIN GLARGINE 100 UNIT/ML ~~LOC~~ SOLN
20.0000 [IU] | Freq: Every day | SUBCUTANEOUS | Status: DC
  Administered 2017-05-04 (×2): 20 [IU] via SUBCUTANEOUS
  Filled 2017-05-03 (×2): qty 0.2

## 2017-05-03 MED ORDER — INSULIN ASPART 100 UNIT/ML ~~LOC~~ SOLN
5.0000 [IU] | Freq: Three times a day (TID) | SUBCUTANEOUS | Status: DC
  Administered 2017-05-04 – 2017-05-05 (×5): 5 [IU] via SUBCUTANEOUS

## 2017-05-03 MED ORDER — DIPHENHYDRAMINE HCL 50 MG/ML IJ SOLN
25.0000 mg | Freq: Once | INTRAMUSCULAR | Status: AC
  Administered 2017-05-03: 25 mg via INTRAVENOUS
  Filled 2017-05-03: qty 1

## 2017-05-03 MED ORDER — LIDOCAINE HCL (PF) 1 % IJ SOLN
5.0000 mL | Freq: Once | INTRAMUSCULAR | Status: AC
  Administered 2017-05-03: 5 mL via INTRADERMAL

## 2017-05-03 MED ORDER — CEFTRIAXONE SODIUM 2 G IJ SOLR
2.0000 g | Freq: Two times a day (BID) | INTRAMUSCULAR | Status: DC
  Administered 2017-05-04 (×2): 2 g via INTRAVENOUS
  Filled 2017-05-03 (×3): qty 2

## 2017-05-03 MED ORDER — SODIUM CHLORIDE 0.9 % IV BOLUS (SEPSIS)
1000.0000 mL | Freq: Once | INTRAVENOUS | Status: AC
Start: 2017-05-03 — End: 2017-05-03
  Administered 2017-05-03: 1000 mL via INTRAVENOUS

## 2017-05-03 MED ORDER — PROPRANOLOL HCL 20 MG PO TABS
20.0000 mg | ORAL_TABLET | Freq: Two times a day (BID) | ORAL | Status: DC
  Administered 2017-05-04 – 2017-05-05 (×4): 20 mg via ORAL
  Filled 2017-05-03 (×4): qty 1

## 2017-05-03 MED ORDER — KETOROLAC TROMETHAMINE 30 MG/ML IJ SOLN
30.0000 mg | Freq: Once | INTRAMUSCULAR | Status: AC
  Administered 2017-05-03: 30 mg via INTRAVENOUS
  Filled 2017-05-03: qty 1

## 2017-05-03 MED ORDER — PROCHLORPERAZINE EDISYLATE 5 MG/ML IJ SOLN
5.0000 mg | Freq: Once | INTRAMUSCULAR | Status: AC
  Administered 2017-05-03: 5 mg via INTRAVENOUS
  Filled 2017-05-03: qty 2

## 2017-05-03 NOTE — ED Notes (Signed)
Ordered pt dinner tray.

## 2017-05-03 NOTE — ED Provider Notes (Signed)
[facility] EMERGENCY DEPARTMENT Provider Note   CSN: [id number] Arrival date & time: 05/03/17  1106     History   Chief Complaint Chief Complaint  Patient presents with  . Fever  . Headache    HPI Jacqueline [name] is a 36 y.o. female with history of hypertension, diabetes who presents with a 5-day history of headache, neck pain and stiffness, and fever.  Patient was just released yesterday from [facility] after hospitalist admission.  During her emergency department course, an LP was attempted, however CSF collection was unsuccessful.  No antibiotics initiated in the ED.  Fever was controlled.  However, patient reports she began feeling lightheaded and her fever spiked again last night.  Patient continues to have headache and neck pain.  She has been alternating Motrin and Tylenol for her fever.  She reports something similar occurring one year ago and she was sent home with a PICC line with Rocephin for several weeks.  She denies any chest pain, shortness of breath, abdominal pain.  Patient has had nausea and vomiting.  She denies any urinary symptoms.  HPI  Past Medical History:  Diagnosis Date  . Anemia   . Anxiety   . Chronic bronchitis ([place])   . Hypertension   . Morbid obesity ([place])   . Type II diabetes mellitus [facility])     Patient Active Problem List   Diagnosis Date Noted  . Sepsis secondary to UTI ([place]) 05/03/2017    Class: Present on Admission  . AKI (acute kidney injury) ([place]) 05/03/2017    Class: Stage 3  . Type II diabetes mellitus ([place]) 04/30/2017  . Anemia 04/30/2017  . Staphylococcus aureus bacteremia 03/15/2016  . Streptococcal bacteremia 03/15/2016  . Tachycardia 03/14/2016  . Fever 03/14/2016  . Headache 03/14/2016  . Morbid obesity ([place])   . HTN (hypertension) 12/22/2015  . Diabetes ([place]) 01/01/2014    Past Surgical History:  Procedure Laterality Date  . BREAST REDUCTION SURGERY  03/21/2017  . CARDIAC CATHETERIZATION  01/06/2016    . CARDIAC CATHETERIZATION N/A 01/06/2016   Procedure: Left Heart Cath and Coronary Angiography;  Surgeon: Wellington Hampshire, MD;  Location: [facility];  Service: Cardiovascular;  Laterality: N/A;  . CESAREAN SECTION  08/2014    OB History    Gravida Para Term Preterm AB Living   1             SAB TAB Ectopic Multiple Live Births                   Home Medications    Prior to Admission medications   Medication Sig Start Date End Date Taking? Authorizing Provider  acetaminophen (TYLENOL) 500 MG tablet Take 1 tablet (500 mg total) by mouth every 6 (six) hours as needed. 08/22/16  Yes Mackuen, Courteney Lyn, MD  insulin aspart (NOVOLOG) 100 UNIT/ML injection Inject 5 Units into the skin 3 (three) times daily with meals. 05/02/17  Yes Donne Hazel, MD  Insulin Glargine (LANTUS) 100 UNIT/ML Solostar Pen Inject 20 Units into the skin daily at 10 pm. 05/02/17  Yes Donne Hazel, MD  propranolol (INDERAL) 20 MG tablet Take 1 tablet (20 mg total) by mouth 2 (two) times daily. 05/02/17   Donne Hazel, MD    Family History Family History  Problem Relation Age of Onset  . Diabetes Mother   . Hypertension Mother   . Thyroid disease Mother   . Kidney disease Maternal Grandmother   .  Diabetes Maternal Grandmother   . Heart attack Unknown     Social History Social History  Substance Use Topics  . Smoking status: Never Smoker  . Smokeless tobacco: Never Used  . Alcohol use No     Allergies   Patient has no known allergies.   Review of Systems Review of Systems  Constitutional: Positive for fever. Negative for chills.  HENT: Negative for facial swelling and sore throat.   Eyes: Negative for visual disturbance.  Respiratory: Negative for shortness of breath.   Cardiovascular: Negative for chest pain.  Gastrointestinal: Positive for diarrhea, nausea and vomiting. Negative for abdominal pain and blood in stool.  Genitourinary: Negative for dysuria.  Musculoskeletal:  Positive for neck pain and neck stiffness. Negative for back pain.  Skin: Negative for rash and wound.  Neurological: Positive for light-headedness and headaches.  Psychiatric/Behavioral: The patient is not nervous/anxious.      Physical Exam Updated Vital Signs BP (!) 163/72   Pulse (!) 102   Temp (!) 101.5 F (38.6 C) (Oral)   Resp 13   Ht 5\' 5"  (1.651 m)   Wt 129.3 kg (285 lb)   LMP 04/26/2017 (Approximate)   SpO2 98%   BMI 47.43 kg/m   Physical Exam  Constitutional: She appears well-developed and well-nourished. No distress.  HENT:  Head: Normocephalic and atraumatic.  Mouth/Throat: Oropharynx is clear and moist. No oropharyngeal exudate.  Eyes: Pupils are equal, round, and reactive to light. Conjunctivae are normal. Right eye exhibits no discharge. Left eye exhibits no discharge. No scleral icterus.  Neck: Normal range of motion. Neck supple. No spinous process tenderness present. No thyromegaly present.  Pain with chin to chest, however able to complete; FROM side to side  Cardiovascular: Normal rate, regular rhythm, normal heart sounds and intact distal pulses.  Exam reveals no gallop and no friction rub.   No murmur heard. Pulmonary/Chest: Effort normal and breath sounds normal. No stridor. No respiratory distress. She has no wheezes. She has no rales.  Abdominal: Soft. Bowel sounds are normal. She exhibits no distension. There is no tenderness. There is no rebound and no guarding.  Musculoskeletal: She exhibits no edema.  Lymphadenopathy:    She has no cervical adenopathy.  Neurological: She is alert. Coordination normal.  CN 3-12 intact; normal sensation throughout; 5/5 strength in all 4 extremities; equal bilateral grip strength  Skin: Skin is warm and dry. No rash noted. She is not diaphoretic. No pallor.  Psychiatric: She has a normal mood and affect.  Nursing note and vitals reviewed.    ED Treatments / Results  Labs (all labs ordered are listed, but only  abnormal results are displayed) Labs Reviewed  COMPREHENSIVE METABOLIC PANEL - Abnormal; Notable for the following:       Result Value   Sodium 134 (*)    CO2 20 (*)    Glucose, Bld 261 (*)    Creatinine, Ser 1.40 (*)    Calcium 8.3 (*)    Total Protein 6.4 (*)    Albumin 3.0 (*)    GFR calc non Af Amer 47 (*)    GFR calc Af Amer 55 (*)    All other components within normal limits  CBC WITH DIFFERENTIAL/PLATELET - Abnormal; Notable for the following:    WBC 12.2 (*)    RBC 3.52 (*)    Hemoglobin 9.7 (*)    HCT 29.1 (*)    Neutro Abs 9.8 (*)    All other components within normal limits  GLUCOSE, CSF - Abnormal; Notable for the following:    Glucose, CSF 128 (*)    All other components within normal limits  CSF CELL COUNT WITH DIFFERENTIAL - Abnormal; Notable for the following:    RBC Count, CSF 51 (*)    All other components within normal limits  I-STAT CG4 LACTIC ACID, ED - Abnormal; Notable for the following:    Lactic Acid, Venous 3.50 (*)    All other components within normal limits  CSF CULTURE  CULTURE, BLOOD (ROUTINE X 2)  CULTURE, BLOOD (ROUTINE X 2)  ANAEROBIC CULTURE  FUNGUS CULTURE WITH STAIN  PROTEIN, CSF  URINALYSIS, ROUTINE W REFLEX MICROSCOPIC  VDRL, CSF  PROCALCITONIN  PROCALCITONIN  LACTIC ACID, PLASMA  LACTIC ACID, PLASMA  PROTIME-INR  APTT  I-STAT CG4 LACTIC ACID, ED  I-STAT CG4 LACTIC ACID, ED    EKG  EKG Interpretation  Date/Time:  Wednesday May 03 2017 12:02:03 EDT Ventricular Rate:  112 PR Interval:    QRS Duration: 80 QT Interval:  331 QTC Calculation: 452 R Axis:   84 Text Interpretation:  Sinus tachycardia Abnormal ekg Confirmed by Carmin Muskrat [phone]) on 05/03/2017 12:07:27 PM       Radiology Dg Chest Port 1 View  Result Date: 05/03/2017 CLINICAL DATA:  Sepsis, continued fever and headache. Suspected meningitis. EXAM: PORTABLE CHEST 1 VIEW COMPARISON:  04/30/2017 FINDINGS: AP portable semi upright view of the chest.  Stable borderline cardiomegaly. No aortic aneurysm. Clear lungs. No acute osseous abnormality. IMPRESSION: No active disease. Electronically Signed   By: Ashley Royalty M.D.   On: 05/03/2017 18:50   Dg Lumbar Puncture Fluoro Guide  Result Date: 05/03/2017 CLINICAL DATA:  Fever and severe headache.  Suspected meningitis. EXAM: DIAGNOSTIC LUMBAR PUNCTURE UNDER FLUOROSCOPIC GUIDANCE FLUOROSCOPY TIME:  Fluoroscopy Time:  12 seconds is Radiation Exposure Index (if provided by the fluoroscopic device): 19 mGy Number of Acquired Spot Images: 2 PROCEDURE: Prior studies were reviewed. Time out procedure was performed. Informed consent was obtained from the patient prior to the procedure, including potential complications of headache, allergy, and pain. With the patient prone, the lower back was prepped with Betadine. 1% Lidocaine was used for local anesthesia. Lumbar puncture was performed at the L2-3 level on the right using a 12.5 cm, 20 gauge needle with return of clear, colorless CSF with an opening pressure of 17.5 cm water, measured prone through the needle. 10 ml of CSF were obtained for laboratory studies. The patient tolerated the procedure well and there were no apparent complications. IMPRESSION: Diagnostic lumbar puncture performed without immediate complications. Electronically Signed   By: Richardean Sale M.D.   On: 05/03/2017 15:24    Procedures Procedures (including critical care time)  Medications Ordered in ED Medications  acetaminophen (TYLENOL) tablet 500 mg (500 mg Oral Given 05/03/17 1858)  insulin aspart (novoLOG) injection 5 Units (not administered)  Insulin Glargine (LANTUS) Solostar Pen 20 Units (not administered)  propranolol (INDERAL) tablet 20 mg (not administered)  sodium chloride 0.9 % bolus 1,000 mL (0 mLs Intravenous Stopped 05/03/17 1553)  ketorolac (TORADOL) 30 MG/ML injection 30 mg (30 mg Intravenous Given 05/03/17 1250)  prochlorperazine (COMPAZINE) injection 5 mg (5 mg  Intravenous Given 05/03/17 1251)  diphenhydrAMINE (BENADRYL) injection 25 mg (25 mg Intravenous Given 05/03/17 1251)  sodium chloride 0.9 % bolus 1,000 mL (0 mLs Intravenous Stopped 05/03/17 1900)  lidocaine (PF) (XYLOCAINE) 1 % injection 5 mL (5 mLs Intradermal Given 05/03/17 1508)     Initial Impression / Assessment  and Plan / ED Course  I have reviewed the triage vital signs and the nursing notes.  Pertinent labs & imaging results that were available during my care of the patient were reviewed by me and considered in my medical decision making (see chart for details).     Patient with continued fever and headache after discharge from [facility]  LP unable to be completed prior to admission, however infectious disease have low suspicion of bacterial meningitis at the time and favored viral versus drug-induced aseptic meningitis.  LP completed today by IR with CSF findings of low concern for bacterial meningitis.  Patient does have AKI and elevation of WBC to 12.2 today that was not present 3 days ago.  Also with elevated lactate improved after fluids.  New blood cultures taken.  Chest x-ray is negative.  No antibiotics initiated considering no source of infection.  Urine pending.  Patient's headache mildly improved with migraine cocktail in the ED.  Patient given 2 L of NS in the ED.  I consulted with Dr. Nevada Crane with [facility] who will admit the patient for further evaluation and treatment.  Patient also evaluated by Dr. Vanita Panda who guided the patient's management and agrees with plan.  Patient understands and agrees with plan.  Final Clinical Impressions(s) / ED Diagnoses   Final diagnoses:  Fever, unspecified fever cause  Bad headache  Neck pain  AKI (acute kidney injury) [facility])    New Prescriptions New Prescriptions   No medications on file         Caryl Ada 05/03/17 1912    Carmin Muskrat, MD 05/05/17 1515

## 2017-05-03 NOTE — ED Notes (Signed)
ED Provider at bedside. 

## 2017-05-03 NOTE — ED Notes (Signed)
Pt transported to radiology.

## 2017-05-03 NOTE — ED Notes (Signed)
Lab results reported to Nurse. 

## 2017-05-03 NOTE — H&P (Signed)
History and Physical  Jacqueline [name] FAO:[id number] DOB: 04-23-1981 DOA: 05/03/2017  Referring physician: Dr. Lanny Cramp PCP: Lin Landsman, MD  Outpatient Specialists: Dr. Jackson Latino Patient coming from: Home and is able to ambulate.  Chief Complaint:  Headache, neck pain, chills, fever, vomiting  HPI: Jacqueline [name] is a 36 y.o. female with past medical history significant for insulin-dependent type 2 diabetes, hypertension, morbid obesity, remote history of staphylococcal and streptococcal bacteremia in 2017 per medical records, chronic normocytic anemia, who presents to the ED on 05/03/2017 with complaints of persistent headaches and recurrent fevers. Patient reports that about a week ago she started having new onset nausea, vomiting, headaches, fevers with tmax 103.3. She self treated with Tylenol and alternating with Motrin with no improvement of her symptoms.   Reports that on Sunday 04/30/17, she went to [facility] in [place] because of high grade fever, headache, neck pain, and vomiting. Due to suspicion for meningitis was transferred to [facility] where she was hospitalized and released yesterday evening 05/02/17. Was not able to obtain LP. After her discharge went home, however, nausea, chills, headaches, and dizziness restarted.   Temperature last night and this morning was 103. Admits to diarrhea last night 4-5 episodes of loose stools, 1 loose stool this morning. RUQ abdominal pain intermittently, dull pain, non radiating, with no worsening or improving factors. + nausea and vomitting last night. Admits to sick contact with her daughter who had viral croup. Went on a lengthy trip 2 weeks ago. Denies chest pain, cough, or dyspnea.  ED Course: Patient was given 1 dose of IV toradol, and lidocaine patch. CBC revealed leukocytosis with white count 12.2; Hg 9.6; chemistry panel revealed AKI with cr 1.40 and GFR 55 with no previous history of CKD.  Review of  Systems: Patient seen today 05/03/17 Pt complains of recurrent fevers, chills, headache, nausea, vomiting, diarrhea, RUQ abdominal pain intermittently.  Pt denies any chest pain, dyspnea, or palpitations.  Review of systems are otherwise negative   Past Medical History:  Diagnosis Date  . Anemia   . Anxiety   . Chronic bronchitis ([place])   . Hypertension   . Morbid obesity ([place])   . Type II diabetes mellitus ([place])    Past Surgical History:  Procedure Laterality Date  . BREAST REDUCTION SURGERY  03/21/2017  . CARDIAC CATHETERIZATION  01/06/2016  . CARDIAC CATHETERIZATION N/A 01/06/2016   Procedure: Left Heart Cath and Coronary Angiography;  Surgeon: Wellington Hampshire, MD;  Location: [facility];  Service: Cardiovascular;  Laterality: N/A;  . CESAREAN SECTION  08/2014    Social History:  reports that she has never smoked. She has never used smokeless tobacco. She reports that she does not drink alcohol or use drugs.   No Known Allergies  Family History  Problem Relation Age of Onset  . Diabetes Mother   . Hypertension Mother   . Thyroid disease Mother   . Kidney disease Maternal Grandmother   . Diabetes Maternal Grandmother   . Heart attack Unknown       Prior to Admission medications   Medication Sig Start Date End Date Taking? Authorizing Provider  acetaminophen (TYLENOL) 500 MG tablet Take 1 tablet (500 mg total) by mouth every 6 (six) hours as needed. 08/22/16  Yes Mackuen, Courteney Lyn, MD  insulin aspart (NOVOLOG) 100 UNIT/ML injection Inject 5 Units into the skin 3 (three) times daily with meals. 05/02/17  Yes Donne Hazel, MD  Insulin Glargine (LANTUS) 100  UNIT/ML Solostar Pen Inject 20 Units into the skin daily at 10 pm. 05/02/17  Yes Donne Hazel, MD  propranolol (INDERAL) 20 MG tablet Take 1 tablet (20 mg total) by mouth 2 (two) times daily. 05/02/17   Donne Hazel, MD    Physical Exam: BP (!) 163/72   Pulse (!) 102   Temp (!) 101.5 F (38.6 C)  (Oral)   Resp 13   Ht 5\' 5"  (1.651 m)   Wt 129.3 kg (285 lb)   LMP 04/26/2017 (Approximate)   SpO2 98%   BMI 47.43 kg/m    General:  Morbidly obese 36 yo [place] American female. Laying in bed in no apparent acute distress. Eyes: PERRL ENT: Mucous membranes are dry; no erythema noted Neck: No JVD or thyromegaly noted Cardiovascular: RRR with no murmurs, rubs or gallops. Trace edema in LE bilaterally. Respiratory: CTA without rhonchi, or wheezes Abdomen: Morbidly obese with RUQ tenderness on palpation Skin: No noted acute lesions, open sores, or rash Musculoskeletal: Strength is intact. No motor deficits. Psychiatric: Appropriate for condition and circumstances Neurologic: No focal neurologic deficits.           Labs on Admission:  Basic Metabolic Panel:  Recent Labs Lab 04/30/17 1230 05/01/17 0556 05/02/17 0559 05/03/17 1235  NA 136 138 136 134*  K 4.3 4.1 3.9 3.9  CL 107 108 104 104  CO2 23 23 23  20*  GLUCOSE 291* 277* 239* 261*  BUN 14 13 14 19   CREATININE 0.92 0.99 0.96 1.40*  CALCIUM 8.5* 8.2* 8.0* 8.3*   Liver Function Tests:  Recent Labs Lab 04/30/17 1230 05/03/17 1235  AST 20 28  ALT 13* 18  ALKPHOS 63 71  BILITOT 0.4 0.8  PROT 7.0 6.4*  ALBUMIN 3.0* 3.0*   No results for input(s): LIPASE, AMYLASE in the last 168 hours. No results for input(s): AMMONIA in the last 168 hours. CBC:  Recent Labs Lab 04/30/17 1230 05/01/17 0556 05/02/17 0559 05/03/17 1235  WBC 8.8 7.8 7.5 12.2*  NEUTROABS 4.5 3.9  --  9.8*  HGB 9.8* 9.4* 8.8* 9.7*  HCT 29.4* 27.5* 25.6* 29.1*  MCV 83.5 82.6 82.1 82.7  PLT 393 363 326 350   Cardiac Enzymes: No results for input(s): CKTOTAL, CKMB, CKMBINDEX, TROPONINI in the last 168 hours.  BNP (last 3 results) No results for input(s): BNP in the last 8760 hours.  ProBNP (last 3 results) No results for input(s): PROBNP in the last 8760 hours.  CBG:  Recent Labs Lab 05/01/17 1701 05/01/17 2143 05/02/17 0816  05/02/17 1136 05/02/17 1647  GLUCAP 189* 174* 264* 195* 140*    Radiological Exams on Admission: Dg Lumbar Puncture Fluoro Guide  Result Date: 05/03/2017 CLINICAL DATA:  Fever and severe headache.  Suspected meningitis. EXAM: DIAGNOSTIC LUMBAR PUNCTURE UNDER FLUOROSCOPIC GUIDANCE FLUOROSCOPY TIME:  Fluoroscopy Time:  12 seconds is Radiation Exposure Index (if provided by the fluoroscopic device): 19 mGy Number of Acquired Spot Images: 2 PROCEDURE: Prior studies were reviewed. Time out procedure was performed. Informed consent was obtained from the patient prior to the procedure, including potential complications of headache, allergy, and pain. With the patient prone, the lower back was prepped with Betadine. 1% Lidocaine was used for local anesthesia. Lumbar puncture was performed at the L2-3 level on the right using a 12.5 cm, 20 gauge needle with return of clear, colorless CSF with an opening pressure of 17.5 cm water, measured prone through the needle. 10 ml of CSF were obtained for laboratory  studies. The patient tolerated the procedure well and there were no apparent complications. IMPRESSION: Diagnostic lumbar puncture performed without immediate complications. Electronically Signed   By: Richardean Sale M.D.   On: 05/03/2017 15:24    EKG: Independently reviewed. Sinus tachycardia with no acute ST T changes.  Assessment/Plan Present on Admission: . Sepsis secondary to UTI ([place]) . Anemia . Fever . Headache . HTN (hypertension) . Morbid obesity ([place]) . Tachycardia . AKI (acute kidney injury) ([place])  Active Problems:   Sepsis secondary to UTI ([place])   AKI (acute kidney injury) ([place])   Diabetes ([place])   HTN (hypertension)   Morbid obesity ([place])   Tachycardia   Fever   Headache   Type II diabetes mellitus ([place])   Anemia  Active Problems: Suspected acute meningitis, present on admission -Lumbar puncture done today, awaiting results -IV rocephin 2 gram BID    Sepsis secondary  to suspected acute meningitis vs UTI ([place]), present on admission -Last U/A done on 04/30/17 revealed Uwbc 6-10 -Appears symptomatic with nausea and vomiting -IV rocephin 2gm BID    AKI (acute kidney injury) ([place]) -cr 1.40 with no prior hx of CKD -Avoid nephrotoxic medications -Gentle IV fluid hydration NS 75cc/hr -Repeat CMP in the morning  Acute metabolic acidosis -Most likely 2/2 to lactic acidosis and AKI -lactic acid 3.50; cr 1.4 -IV fluid NS at 75cc/hr -repeat lactic acid tonight  Acute hyponatremia -Na+ 134 -continue to monitor -Repeat chem panel in the morning    Insulin dependent type 2 Diabetes (HCC) -A1C -Continue novolog and lantus; accuchecks before meals    HTN (hypertension) -uncontrolled -continue BB    Morbid obesity (H[facility]) -No acute issues    Headache -Tylenol prn -Avoid NSAIDS due to AKI    Chronic normocytic Anemia -Hg 9.7 -Last menstrual period last week -No other obvious source of bleeding  Diarrhea -Improving -Monitor stool output -If recurrence, consider stool/c-diff testing.   DVT prophylaxis: SCDs  Code Status: Full code  Family Communication: None  Disposition Plan: Inpatient admission to general medical floor with remote telemetry for further testing and treatment.  Consults called: None  Admission status: Inpatient    Kayleen Memos MD [facility] Pager [phone]  If 7PM-7AM, please contact night-coverage [email or web address] Password [email or web address]  05/03/2017, 6:45 PM

## 2017-05-03 NOTE — ED Notes (Addendum)
Unable to gain IV access.

## 2017-05-03 NOTE — ED Triage Notes (Addendum)
Pt reports recent admission to [facility], was discharged yesterday. They had suspected meningitis but unable to complete LP. Pt continues having fever and severe headache. Reports now having jelly like stools and diarrhea. Pt reports temp 103 this am and took tylenol at 0900.

## 2017-05-03 NOTE — Procedures (Signed)
Diagnostic lumbar puncture performed without immediate complications. Details dictated in Radiology report.

## 2017-05-03 NOTE — ED Notes (Signed)
Phlebotomy at bedside.

## 2017-05-04 DIAGNOSIS — I10 Essential (primary) hypertension: Secondary | ICD-10-CM

## 2017-05-04 LAB — BLOOD CULTURE ID PANEL (REFLEXED)

## 2017-05-04 LAB — CBC WITH DIFFERENTIAL/PLATELET
BASOS PCT: 0 %
Basophils Absolute: 0 10*3/uL (ref 0.0–0.1)
Eosinophils Absolute: 0.2 10*3/uL (ref 0.0–0.7)
Eosinophils Relative: 3 %
HEMATOCRIT: 26.5 % — AB (ref 36.0–46.0)
HEMOGLOBIN: 8.8 g/dL — AB (ref 12.0–15.0)
Lymphocytes Relative: 35 %
Lymphs Abs: 3.1 10*3/uL (ref 0.7–4.0)
MCH: 27.5 pg (ref 26.0–34.0)
MCHC: 33.2 g/dL (ref 30.0–36.0)
MCV: 82.8 fL (ref 78.0–100.0)
MONOS PCT: 7 %
Monocytes Absolute: 0.6 10*3/uL (ref 0.1–1.0)
NEUTROS ABS: 5 10*3/uL (ref 1.7–7.7)
Neutrophils Relative %: 55 %
Platelets: 316 10*3/uL (ref 150–400)
RBC: 3.2 MIL/uL — AB (ref 3.87–5.11)
RDW: 13.7 % (ref 11.5–15.5)
WBC: 8.9 10*3/uL (ref 4.0–10.5)

## 2017-05-04 LAB — COMPREHENSIVE METABOLIC PANEL
ALBUMIN: 2.6 g/dL — AB (ref 3.5–5.0)
ALK PHOS: 57 U/L (ref 38–126)
ALT: 18 U/L (ref 14–54)
ANION GAP: 8 (ref 5–15)
AST: 26 U/L (ref 15–41)
BILIRUBIN TOTAL: 0.4 mg/dL (ref 0.3–1.2)
BUN: 20 mg/dL (ref 6–20)
CALCIUM: 7.9 mg/dL — AB (ref 8.9–10.3)
CO2: 21 mmol/L — AB (ref 22–32)
CREATININE: 1.13 mg/dL — AB (ref 0.44–1.00)
Chloride: 106 mmol/L (ref 101–111)
GFR calc Af Amer: >60 mL/min (ref >60)
GFR calc non Af Amer: >60 mL/min (ref >60)
GLUCOSE: 253 mg/dL — AB (ref 65–99)
Potassium: 3.8 mmol/L (ref 3.5–5.1)
SODIUM: 135 mmol/L (ref 135–145)
TOTAL PROTEIN: 5.8 g/dL — AB (ref 6.5–8.1)

## 2017-05-04 LAB — GLUCOSE, CAPILLARY
GLUCOSE-CAPILLARY: 233 mg/dL — AB (ref 65–99)
Glucose-Capillary: 253 mg/dL — ABNORMAL HIGH (ref 65–99)

## 2017-05-04 LAB — VDRL, CSF: SYPHILIS VDRL QUANT CSF: NONREACTIVE

## 2017-05-04 LAB — PROCALCITONIN: PROCALCITONIN: 1.51 ng/mL

## 2017-05-04 LAB — CBG MONITORING, ED: Glucose-Capillary: 221 mg/dL — ABNORMAL HIGH (ref 65–99)

## 2017-05-04 LAB — LACTIC ACID, PLASMA: Lactic Acid, Venous: 0.8 mmol/L (ref 0.5–1.9)

## 2017-05-04 MED ORDER — KETOROLAC TROMETHAMINE 30 MG/ML IJ SOLN
30.0000 mg | Freq: Once | INTRAMUSCULAR | Status: AC
  Administered 2017-05-04: 30 mg via INTRAVENOUS
  Filled 2017-05-04: qty 1

## 2017-05-04 MED ORDER — HEPARIN SODIUM (PORCINE) 5000 UNIT/ML IJ SOLN
5000.0000 [IU] | Freq: Three times a day (TID) | INTRAMUSCULAR | Status: DC
  Administered 2017-05-04 – 2017-05-05 (×3): 5000 [IU] via SUBCUTANEOUS
  Filled 2017-05-04 (×3): qty 1

## 2017-05-04 MED ORDER — METHOCARBAMOL 750 MG PO TABS
750.0000 mg | ORAL_TABLET | Freq: Three times a day (TID) | ORAL | Status: DC | PRN
  Administered 2017-05-04 (×2): 750 mg via ORAL
  Filled 2017-05-04 (×2): qty 1

## 2017-05-04 MED ORDER — DIPHENHYDRAMINE HCL 50 MG/ML IJ SOLN
25.0000 mg | Freq: Once | INTRAMUSCULAR | Status: AC
  Administered 2017-05-04: 25 mg via INTRAVENOUS
  Filled 2017-05-04: qty 1

## 2017-05-04 MED ORDER — INSULIN ASPART 100 UNIT/ML ~~LOC~~ SOLN
0.0000 [IU] | Freq: Three times a day (TID) | SUBCUTANEOUS | Status: DC
  Administered 2017-05-04: 8 [IU] via SUBCUTANEOUS
  Administered 2017-05-05: 5 [IU] via SUBCUTANEOUS
  Administered 2017-05-05: 3 [IU] via SUBCUTANEOUS

## 2017-05-04 MED ORDER — CIPROFLOXACIN HCL 500 MG PO TABS
500.0000 mg | ORAL_TABLET | Freq: Two times a day (BID) | ORAL | Status: DC
Start: 2017-05-04 — End: 2017-05-05
  Administered 2017-05-04 – 2017-05-05 (×2): 500 mg via ORAL
  Filled 2017-05-04 (×2): qty 1

## 2017-05-04 MED ORDER — METOCLOPRAMIDE HCL 5 MG/ML IJ SOLN
10.0000 mg | Freq: Once | INTRAMUSCULAR | Status: AC
Start: 2017-05-04 — End: 2017-05-04
  Administered 2017-05-04: 10 mg via INTRAVENOUS
  Filled 2017-05-04: qty 2

## 2017-05-04 NOTE — Progress Notes (Signed)
Results for Jacqueline [name], Jacqueline [name] (MRN [id number]) as of 05/04/2017 10:57  Ref. Range 05/03/2017 12:35 05/04/2017 03:04  Glucose Latest Ref Range: 65 - 99 mg/dL 261 (H) 253 (H)  Noted that patient is on home insulin dosages. Recommend adding Novolog SENSITIVE correction scale TID while in the hospital. Will continue to monitor blood sugars while in the hospital.   Harvel Ricks RN BSN CDE Diabetes Coordinator Pager: [phone]  8am-5pm

## 2017-05-04 NOTE — Progress Notes (Signed)
PROGRESS NOTE  Jacqueline [name] BUL:[id number] DOB: 09-Apr-1981 DOA: 05/03/2017 PCP: Lin Landsman, MD  HPI/Recap of past 24 hours: Jacqueline [name] is a 36 y.o. female with past medical history significant for insulin-dependent type 2 diabetes, hypertension, morbid obesity, remote history of staphylococcal and streptococcal bacteremia in 2017 per medical records, chronic normocytic anemia, who presents to the ED on 05/03/2017 with complaints of persistent headaches and recurrent fevers. Recently admitted and discharged from [facility].  This morning reports persistent headaches and neck pain, sharp 7/10, non radiating associated with light sensitivity. No nausea or vomiting.  Physical Exam:  General:  Morbidly obese 36 yo [place] American female. Laying in bed in no apparent acute distress.  Eyes: PERRL  ENT: Mucous membranes are dry; no erythema noted  Neck: No JVD or thyromegaly noted  Cardiovascular: RRR with no murmurs, rubs or gallops. Trace edema in LE bilaterally.  Respiratory: CTA without rhonchi, or wheezes  Abdomen: Morbidly obese with RUQ tenderness on palpation  Skin: No noted acute lesions, open sores, or rash  Musculoskeletal: Strength is intact. No motor deficits.  Psychiatric: Appropriate for condition and circumstances  Neurologic: No focal neurologic deficits.    Objective: Vitals:   05/04/17 0500 05/04/17 0626 05/04/17 0652 05/04/17 0922  BP: 133/70 (!) 167/84 140/65 (!) 158/85  Pulse: 88 92 85 88  Resp: 18 18 18    Temp:   98.6 F (37 C)   TempSrc:      SpO2: 99% 100% 99%   Weight:      Height:        Intake/Output Summary ([name] 24 hours) at 05/04/17 1244 [name] data filed at 05/04/17 0045  Gross per 24 hour  Intake             2050 ml  Output                0 ml  Net             2050 ml   Filed Weights   05/03/17 1154  Weight: 129.3 kg (285 lb)    Data Reviewed: CBC:  Recent Labs Lab 04/30/17 1230 05/01/17 0556 05/02/17 0559  05/03/17 1235 05/04/17 0304  WBC 8.8 7.8 7.5 12.2* 8.9  NEUTROABS 4.5 3.9  --  9.8* 5.0  HGB 9.8* 9.4* 8.8* 9.7* 8.8*  HCT 29.4* 27.5* 25.6* 29.1* 26.5*  MCV 83.5 82.6 82.1 82.7 82.8  PLT 393 363 326 350 321   Basic Metabolic Panel:  Recent Labs Lab 04/30/17 1230 05/01/17 0556 05/02/17 0559 05/03/17 1235 05/04/17 0304  NA 136 138 136 134* 135  K 4.3 4.1 3.9 3.9 3.8  CL 107 108 104 104 106  CO2 23 23 23  20* 21*  GLUCOSE 291* 277* 239* 261* 253*  BUN 14 13 14 19 20   CREATININE 0.92 0.99 0.96 1.40* 1.13*  CALCIUM 8.5* 8.2* 8.0* 8.3* 7.9*   GFR: Estimated Creatinine Clearance: 92.4 mL/min (A) (by C-G formula based on SCr of 1.13 mg/dL (H)). Liver Function Tests:  Recent Labs Lab 04/30/17 1230 05/03/17 1235 05/04/17 0304  AST 20 28 26   ALT 13* 18 18  ALKPHOS 63 71 57  BILITOT 0.4 0.8 0.4  PROT 7.0 6.4* 5.8*  ALBUMIN 3.0* 3.0* 2.6*   No results for input(s): LIPASE, AMYLASE in the [name] 168 hours. No results for input(s): AMMONIA in the [name] 168 hours. Coagulation Profile:  Recent Labs Lab 05/03/17 2047  INR 1.10   Cardiac Enzymes: No results for input(s):  CKTOTAL, CKMB, CKMBINDEX, TROPONINI in the [name] 168 hours. BNP ([name] 3 results) No results for input(s): PROBNP in the [name] 8760 hours. HbA1C: No results for input(s): HGBA1C in the [name] 72 hours. CBG:  Recent Labs Lab 05/01/17 2143 05/02/17 0816 05/02/17 1136 05/02/17 1647 05/04/17 0117  GLUCAP 174* 264* 195* 140* 221*   Lipid Profile: No results for input(s): CHOL, HDL, LDLCALC, TRIG, CHOLHDL, LDLDIRECT in the [name] 72 hours. Thyroid Function Tests: No results for input(s): TSH, T4TOTAL, FREET4, T3FREE, THYROIDAB in the [name] 72 hours. Anemia Panel: No results for input(s): VITAMINB12, FOLATE, FERRITIN, TIBC, IRON, RETICCTPCT in the [name] 72 hours. Urine analysis:    Component Value Date/Time   COLORURINE YELLOW 05/03/2017 2131   APPEARANCEUR CLOUDY (A) 05/03/2017 2131   LABSPEC 1.020  05/03/2017 2131   PHURINE 5.0 05/03/2017 2131   GLUCOSEU 50 (A) 05/03/2017 2131   HGBUR SMALL (A) 05/03/2017 2131   BILIRUBINUR NEGATIVE 05/03/2017 2131   KETONESUR NEGATIVE 05/03/2017 2131   PROTEINUR 100 (A) 05/03/2017 2131   UROBILINOGEN 0.2 08/21/2012 2130   NITRITE NEGATIVE 05/03/2017 2131   LEUKOCYTESUR SMALL (A) 05/03/2017 2131   Sepsis Labs: @LABRCNTIP (procalcitonin:4,lacticidven:4)  ) Recent Results (from the past 240 hour(s))  Blood culture (routine x 2)     Status: None (Preliminary result)   Collection Time: 04/30/17 12:50 PM  Result Value Ref Range Status   Specimen Description BLOOD LEFT ARM  Final   Special Requests   Final    BOTTLES DRAWN AEROBIC AND ANAEROBIC Blood Culture adequate volume   Culture   Final    NO GROWTH 3 DAYS Performed at [facility], [place] [address], [place], [place] [address]    Report Status PENDING  Incomplete  Blood culture (routine x 2)     Status: None (Preliminary result)   Collection Time: 04/30/17  3:25 PM  Result Value Ref Range Status   Specimen Description BLOOD LEFT ARM  Final   Special Requests   Final    BOTTLES DRAWN AEROBIC AND ANAEROBIC Blood Culture adequate volume   Culture   Final    NO GROWTH 3 DAYS Performed at [facility], [address] [place], [place] [address]    Report Status PENDING  Incomplete  Blood culture (routine x 2)     Status: None (Preliminary result)   Collection Time: 05/03/17 12:35 PM  Result Value Ref Range Status   Specimen Description BLOOD RIGHT HAND  Final   Special Requests IN PEDIATRIC BOTTLE Blood Culture adequate volume  Final   Culture  Setup Time   Final    GRAM POSITIVE COCCI IN CLUSTERS IN PEDIATRIC BOTTLE CRITICAL RESULT CALLED TO, READ BACK BY AND VERIFIED WITH: Ferne Coe PHARMD, AT [id number] 05/04/17 BY D. VANHOOK    Culture GRAM POSITIVE COCCI  Final   Report Status PENDING  Incomplete  Blood Culture ID Panel (Reflexed)     Status: Abnormal   Collection Time:  05/03/17 12:35 PM  Result Value Ref Range Status   Enterococcus species NOT DETECTED NOT DETECTED Final   Listeria monocytogenes NOT DETECTED NOT DETECTED Final   Staphylococcus species DETECTED (A) NOT DETECTED Final    Comment: Methicillin (oxacillin) susceptible coagulase negative staphylococcus. Possible blood culture contaminant (unless isolated from more than one blood culture draw or clinical case suggests pathogenicity). No antibiotic treatment is indicated for blood  culture contaminants. CRITICAL RESULT CALLED TO, READ BACK BY AND VERIFIED WITHFerne Coe PHARMD, AT [phone] 05/04/17 BY  D. VANHOOK    Staphylococcus aureus NOT DETECTED NOT DETECTED Final   Methicillin resistance NOT DETECTED NOT DETECTED Final   Streptococcus species NOT DETECTED NOT DETECTED Final   Streptococcus agalactiae NOT DETECTED NOT DETECTED Final   Streptococcus pneumoniae NOT DETECTED NOT DETECTED Final   Streptococcus pyogenes NOT DETECTED NOT DETECTED Final   Acinetobacter baumannii NOT DETECTED NOT DETECTED Final   Enterobacteriaceae species NOT DETECTED NOT DETECTED Final   Enterobacter cloacae complex NOT DETECTED NOT DETECTED Final   Escherichia coli NOT DETECTED NOT DETECTED Final   Klebsiella oxytoca NOT DETECTED NOT DETECTED Final   Klebsiella pneumoniae NOT DETECTED NOT DETECTED Final   Proteus species NOT DETECTED NOT DETECTED Final   Serratia marcescens NOT DETECTED NOT DETECTED Final   Haemophilus influenzae NOT DETECTED NOT DETECTED Final   Neisseria meningitidis NOT DETECTED NOT DETECTED Final   Pseudomonas aeruginosa NOT DETECTED NOT DETECTED Final   Candida albicans NOT DETECTED NOT DETECTED Final   Candida glabrata NOT DETECTED NOT DETECTED Final   Candida krusei NOT DETECTED NOT DETECTED Final   Candida parapsilosis NOT DETECTED NOT DETECTED Final   Candida tropicalis NOT DETECTED NOT DETECTED Final  CSF culture     Status: None (Preliminary result)   Collection Time: 05/03/17  3:09  PM  Result Value Ref Range Status   Specimen Description CSF  Final   Special Requests NONE  Final   Gram Stain   Final    WBC PRESENT, PREDOMINANTLY MONONUCLEAR NO ORGANISMS SEEN CYTOSPIN SMEAR    Culture NO GROWTH < 24 HOURS  Final   Report Status PENDING  Incomplete      Studies: Dg Chest Port 1 View  Result Date: 05/03/2017 CLINICAL DATA:  Sepsis, continued fever and headache. Suspected meningitis. EXAM: PORTABLE CHEST 1 VIEW COMPARISON:  04/30/2017 FINDINGS: AP portable semi upright view of the chest. Stable borderline cardiomegaly. No aortic aneurysm. Clear lungs. No acute osseous abnormality. IMPRESSION: No active disease. Electronically Signed   By: Ashley Royalty M.D.   On: 05/03/2017 18:50   Dg Lumbar Puncture Fluoro Guide  Result Date: 05/03/2017 CLINICAL DATA:  Fever and severe headache.  Suspected meningitis. EXAM: DIAGNOSTIC LUMBAR PUNCTURE UNDER FLUOROSCOPIC GUIDANCE FLUOROSCOPY TIME:  Fluoroscopy Time:  12 seconds is Radiation Exposure Index (if provided by the fluoroscopic device): 19 mGy Number of Acquired Spot Images: 2 PROCEDURE: Prior studies were reviewed. Time out procedure was performed. Informed consent was obtained from the patient prior to the procedure, including potential complications of headache, allergy, and pain. With the patient prone, the lower back was prepped with Betadine. 1% Lidocaine was used for local anesthesia. Lumbar puncture was performed at the L2-3 level on the right using a 12.5 cm, 20 gauge needle with return of clear, colorless CSF with an opening pressure of 17.5 cm water, measured prone through the needle. 10 ml of CSF were obtained for laboratory studies. The patient tolerated the procedure well and there were no apparent complications. IMPRESSION: Diagnostic lumbar puncture performed without immediate complications. Electronically Signed   By: Richardean Sale M.D.   On: 05/03/2017 15:24    Scheduled Meds: . heparin  5,000 Units  Subcutaneous Q8H  . insulin aspart  0-15 Units Subcutaneous TID WC  . insulin aspart  5 Units Subcutaneous TID WC  . insulin glargine  20 Units Subcutaneous Q2200  . propranolol  20 mg Oral BID    Continuous Infusions: . sodium chloride 75 mL/hr at 05/04/17 0143  . cefTRIAXone (ROCEPHIN)  IV Stopped (05/04/17 0948)     LOS: 1 day   Assessment/Plan  Active Problems: Suspected acute meningitis, present on admission -Lumbar puncture done 05/03/17; results unremarkable -Stop IV rocephin 2 gram BID   Sepsis secondary to UTI [facility]), present on admission -[name] U/A done on 04/30/17 revealed Uwbc 6-10 -Appears symptomatic with nausea and vomiting -Po ciprofloxacin 500 mg BID x7 days   AKI (acute kidney injury) ([place]) -Improving cr 1.13 from 1.40 on admission -No prior hx of CKD -Avoid nephrotoxic medications -Continue IV fluid hydration NS 75cc/hr -Repeat BMP in the morning  Acute metabolic acidosis, improving -Most likely 2/2 to lactic acidosis and AKI -lactic acid 0.8 from 3.50 admission -Continue IV fluid NS at 75cc/hr  Acute hyponatremia, resolved -Na+ 135 from 134 admission -BMP in the morning   Insulin dependent type 2 Diabetes (HCC) -A1C 10.8 (05/01/17) -Continue lantus, Insulin sliding scale   HTN (hypertension) -BP is stable -continue BB, propranolol   Morbid obesity ([place]) -Needs to lose weight once stable with healthy diet and regular exercise   Persistent headache, suspected migraine -IV toradol, IV reglan, and IV benadryl once as recommended by neurology -Tylenol prn -Avoid NSAIDS due to AKI -If no improvement with above cocktail, try Depakote 500 mg IV once plus solumedrol 50 mg IV once, per neurology recommendations.   Chronic normocytic Anemia -Hg 8.8 mcv 82.8 -Follow up with PCP -[name] menstrual period [name] week -No obvious source of bleeding  Diarrhea -Resolved -If recurrence, consider stool/c-diff testing.   DVT prophylaxis:  Heparin 5000 U sq tid  Code Status: Full code  Family Communication: None  Disposition Plan: Will stay another midnight to control symptoms of headache  Consults called: Neurology; we appreciate recommendations    Kayleen Memos, MD [facility] Pager [phone]-  If 7PM-7AM, please contact night-coverage [email or web address] Password [email or web address] 05/04/2017, 12:44 PM

## 2017-05-04 NOTE — Progress Notes (Signed)
  PHARMACY - PHYSICIAN COMMUNICATION CRITICAL VALUE ALERT - BLOOD CULTURE IDENTIFICATION (BCID)  Results for orders placed or performed during the hospital encounter of 05/03/17  Blood Culture ID Panel (Reflexed) (Collected: 05/03/2017 12:35 PM)  Result Value Ref Range   Enterococcus species NOT DETECTED NOT DETECTED   Listeria monocytogenes NOT DETECTED NOT DETECTED   Staphylococcus species DETECTED (A) NOT DETECTED   Staphylococcus aureus NOT DETECTED NOT DETECTED   Methicillin resistance NOT DETECTED NOT DETECTED   Streptococcus species NOT DETECTED NOT DETECTED   Streptococcus agalactiae NOT DETECTED NOT DETECTED   Streptococcus pneumoniae NOT DETECTED NOT DETECTED   Streptococcus pyogenes NOT DETECTED NOT DETECTED   Acinetobacter baumannii NOT DETECTED NOT DETECTED   Enterobacteriaceae species NOT DETECTED NOT DETECTED   Enterobacter cloacae complex NOT DETECTED NOT DETECTED   Escherichia coli NOT DETECTED NOT DETECTED   Klebsiella oxytoca NOT DETECTED NOT DETECTED   Klebsiella pneumoniae NOT DETECTED NOT DETECTED   Proteus species NOT DETECTED NOT DETECTED   Serratia marcescens NOT DETECTED NOT DETECTED   Haemophilus influenzae NOT DETECTED NOT DETECTED   Neisseria meningitidis NOT DETECTED NOT DETECTED   Pseudomonas aeruginosa NOT DETECTED NOT DETECTED   Candida albicans NOT DETECTED NOT DETECTED   Candida glabrata NOT DETECTED NOT DETECTED   Candida krusei NOT DETECTED NOT DETECTED   Candida parapsilosis NOT DETECTED NOT DETECTED   Candida tropicalis NOT DETECTED NOT DETECTED    Name of physician (or Provider) Contacted: Dr. Nevada Crane  Changes to prescribed antibiotics required: None - likely a contaminant since only in 1 of 2 sets - noted recent BCx from 10/14 were clear. MD still has Rocephin on board due to concern for meningitis.   Lawson Radar 05/04/2017  9:13 AM

## 2017-05-04 NOTE — Progress Notes (Signed)
Admitted pt.from ED ,AAO x4;no resp.distress noted,;skin warm & dry ;no skin breakdown noted ;Placed  Pt.on Tele ,;V/S taken & recorded .Made comfortable in bed .Will continue to monitor pt.

## 2017-05-05 ENCOUNTER — Encounter (HOSPITAL_COMMUNITY): Payer: Self-pay | Admitting: Internal Medicine

## 2017-05-05 LAB — CBC WITH DIFFERENTIAL/PLATELET
BASOS ABS: 0.1 10*3/uL (ref 0.0–0.1)
Basophils Relative: 1 %
EOS PCT: 4 %
Eosinophils Absolute: 0.3 10*3/uL (ref 0.0–0.7)
HEMATOCRIT: 25.7 % — AB (ref 36.0–46.0)
Hemoglobin: 8.5 g/dL — ABNORMAL LOW (ref 12.0–15.0)
LYMPHS ABS: 3 10*3/uL (ref 0.7–4.0)
Lymphocytes Relative: 40 %
MCH: 27.2 pg (ref 26.0–34.0)
MCHC: 33.1 g/dL (ref 30.0–36.0)
MCV: 82.1 fL (ref 78.0–100.0)
MONO ABS: 0.6 10*3/uL (ref 0.1–1.0)
MONOS PCT: 8 %
NEUTROS ABS: 3.5 10*3/uL (ref 1.7–7.7)
Neutrophils Relative %: 47 %
Platelets: 312 10*3/uL (ref 150–400)
RBC: 3.13 MIL/uL — ABNORMAL LOW (ref 3.87–5.11)
RDW: 13.8 % (ref 11.5–15.5)
WBC: 7.5 10*3/uL (ref 4.0–10.5)

## 2017-05-05 LAB — BASIC METABOLIC PANEL
ANION GAP: 8 (ref 5–15)
BUN: 15 mg/dL (ref 6–20)
CHLORIDE: 106 mmol/L (ref 101–111)
CO2: 21 mmol/L — AB (ref 22–32)
Calcium: 8 mg/dL — ABNORMAL LOW (ref 8.9–10.3)
Creatinine, Ser: 0.99 mg/dL (ref 0.44–1.00)
GFR calc Af Amer: >60 mL/min (ref >60)
GLUCOSE: 211 mg/dL — AB (ref 65–99)
POTASSIUM: 4.1 mmol/L (ref 3.5–5.1)
Sodium: 135 mmol/L (ref 135–145)

## 2017-05-05 LAB — CULTURE, BLOOD (ROUTINE X 2)
Culture: NO GROWTH
Culture: NO GROWTH
SPECIAL REQUESTS: ADEQUATE
SPECIAL REQUESTS: ADEQUATE
Special Requests: ADEQUATE

## 2017-05-05 LAB — PROCALCITONIN: PROCALCITONIN: 0.78 ng/mL

## 2017-05-05 LAB — GLUCOSE, CAPILLARY
Glucose-Capillary: 236 mg/dL — ABNORMAL HIGH (ref 65–99)
Glucose-Capillary: 185 mg/dL — ABNORMAL HIGH (ref 65–99)

## 2017-05-05 MED ORDER — INSULIN GLARGINE 100 UNIT/ML ~~LOC~~ SOLN: 30.0000 [IU] | Freq: Every day | SUBCUTANEOUS | Status: DC

## 2017-05-05 MED ORDER — METOCLOPRAMIDE HCL 5 MG/ML IJ SOLN
10.0000 mg | Freq: Once | INTRAMUSCULAR | Status: AC
  Administered 2017-05-05: 10 mg via INTRAVENOUS
  Filled 2017-05-05: qty 2

## 2017-05-05 MED ORDER — CIPROFLOXACIN HCL 500 MG PO TABS: 500.0000 mg | ORAL_TABLET | Freq: Two times a day (BID) | ORAL | 0 refills | Status: AC

## 2017-05-05 MED ORDER — DIPHENHYDRAMINE HCL 50 MG/ML IJ SOLN
25.0000 mg | Freq: Once | INTRAMUSCULAR | Status: AC
  Administered 2017-05-05: 25 mg via INTRAVENOUS
  Filled 2017-05-05: qty 1

## 2017-05-05 MED ORDER — KETOROLAC TROMETHAMINE 30 MG/ML IJ SOLN
30.0000 mg | Freq: Once | INTRAMUSCULAR | Status: AC
  Administered 2017-05-05: 30 mg via INTRAVENOUS
  Filled 2017-05-05: qty 1

## 2017-05-05 NOTE — Discharge Instructions (Signed)
Acute Kidney Injury, Adult Acute kidney injury is a sudden worsening of kidney function. The kidneys are organs that have several jobs. They filter the blood to remove waste products and extra fluid. They also maintain a healthy balance of minerals and hormones in the body, which helps control blood pressure and keep bones strong. With this condition, your kidneys do not do their jobs as well as they should. This condition ranges from mild to severe. Over time it may develop into long-lasting (chronic) kidney disease. Early detection and treatment may prevent acute kidney injury from developing into a chronic condition. What are the causes? Common causes of this condition include:  A problem with blood flow to the kidneys. This may be caused by:  Low blood pressure (hypotension) or shock.  Blood loss.  Heart and blood vessel (cardiovascular) disease.  Severe burns.  Liver disease.  Direct damage to the kidneys. This may be caused by:  Certain medicines.  A kidney infection.  Poisoning.  Being around or in contact with toxic substances.  A surgical wound.  A hard, direct hit to the kidney area.  A sudden blockage of urine flow. This may be caused by:  Cancer.  Kidney stones.  An enlarged prostate in males. What are the signs or symptoms? Symptoms of this condition may not be obvious until the condition becomes severe. Symptoms of this condition can include:  Tiredness (lethargy), or difficulty staying awake.  Nausea or vomiting.  Swelling (edema) of the face, legs, ankles, or feet.  Problems with urination, such as:  Abdominal pain, or pain along the side of your stomach (flank).  Decreased urine production.  Decrease in the force of urine flow.  Muscle twitches and cramps, especially in the legs.  Confusion or trouble concentrating.  Loss of appetite.  Fever. How is this diagnosed? This condition may be diagnosed with tests, including:  Blood  tests.  Urine tests.  Imaging tests.  A test in which a sample of tissue is removed from the kidneys to be examined under a microscope (kidney biopsy). How is this treated? Treatment for this condition depends on the cause and how severe the condition is. In mild cases, treatment may not be needed. The kidneys may heal on their own. In more severe cases, treatment will involve:  Treating the cause of the kidney injury. This may involve changing any medicines you are taking or adjusting your dosage.  Fluids. You may need specialized IV fluids to balance your body's needs.  Having a catheter placed to drain urine and prevent blockages.  Preventing problems from occurring. This may mean avoiding certain medicines or procedures that can cause further injury to the kidneys. In some cases treatment may also require:  A procedure to remove toxic wastes from the body (dialysis or continuous renal replacement therapy - CRRT).  Surgery. This may be done to repair a torn kidney, or to remove the blockage from the urinary system. Follow these instructions at home: Medicines   Take over-the-counter and prescription medicines only as told by your health care provider.  Do not take any new medicines without your health care provider's approval. Many medicines can worsen your kidney damage.  Do not take any vitamin and mineral supplements without your health care provider's approval. Many nutritional supplements can worsen your kidney damage. Lifestyle   If your health care provider prescribed changes to your diet, follow them. You may need to decrease the amount of protein you eat.  Achieve and maintain a   healthy weight. If you need help with this, ask your health care provider.  Start or continue an exercise plan. Try to exercise at least 30 minutes a day, 5 days a week.  Do not use any tobacco products, such as cigarettes, chewing tobacco, and e-cigarettes. If you need help quitting, ask  your health care provider. General instructions   Keep track of your blood pressure. Report changes in your blood pressure as told by your health care provider.  Stay up to date with immunizations. Ask your health care provider which immunizations you need.  Keep all follow-up visits as told by your health care provider. This is important. Where to find more information:  American Association of Kidney Patients: www.aakp.org  National Kidney Foundation: www.kidney.org  American Kidney Fund: www.akfinc.org  [employer] [facility]:  www.lifeoptions.org  www.kidneyschool.org Contact a health care provider if:  Your symptoms get worse.  You develop new symptoms. Get help right away if:  You develop symptoms of worsening kidney disease, which include:  Headaches.  Abnormally dark or light skin.  Easy bruising.  Frequent hiccups.  Chest pain.  Shortness of breath.  End of menstruation in women.  Seizures.  Confusion or altered mental status.  Abdominal or back pain.  Itchiness.  You have a fever.  Your body is producing less urine.  You have pain or bleeding when you urinate. Summary  Acute kidney injury is a sudden worsening of kidney function.  Acute kidney injury can be caused by problems with blood flow to the kidneys, direct damage to the kidneys, and sudden blockage of urine flow.  Symptoms of this condition may not be obvious until it becomes severe. Symptoms may include edema, lethargy, confusion, nausea or vomiting, and problems passing urine.  This condition can usually be diagnosed with blood tests, urine tests, and imaging tests. Sometimes a kidney biopsy is done to diagnose this condition.  Treatment for this condition often involves treating the underlying cause. It is treated with fluids, medicines, dialysis, diet changes, or surgery. This information is not intended to replace advice given to you by your health care provider.  Make sure you discuss any questions you have with your health care provider. Document Released: 01/17/2011 Document Revised: 06/24/2016 Document Reviewed: 06/24/2016 Elsevier Interactive Patient Education  2017 Elsevier Inc.  

## 2017-05-05 NOTE — Discharge Summary (Signed)
Discharge Summary  Jacqueline [name] WUJ:[id number] DOB: 11/11/1980  PCP: Lin Landsman, MD  Admit date: 05/03/2017 Discharge date: 05/05/2017  Time spent: 25 minutes  Recommendations for Outpatient Follow-up:  .  Repeat CBC; last hemoglobin 8.7 on 05/06/2015. Marland Kitchen  Recommend anemia workup as outpatient .  Complete p.o. antibiotic for UTI which was present on admission .  Avoid nephrotoxic agents due to recent AKI.  Discharge Diagnoses:  Active Hospital Problems   Diagnosis Date Noted  . Sepsis ([place]) 05/03/2017    Priority: High    Class: Present on Admission  . AKI (acute kidney injury) ([place]) 05/03/2017    Priority: High    Class: Stage 3  . Anemia 04/30/2017  . Type II diabetes mellitus ([place]) 04/30/2017  . Fever 03/14/2016  . Bad headache 03/14/2016  . Tachycardia 03/14/2016  . Morbid obesity ([place])   . HTN (hypertension) 12/22/2015  . Diabetes ([place]) 01/01/2014    Resolved Hospital Problems   Diagnosis Date Noted Date Resolved  No resolved problems to display.    Discharge Condition: Stable  Diet recommendation: Cardiac diabetic diet  Vitals:   05/05/17 0507 05/05/17 0855  BP: (!) 131/54 (!) 159/88  Pulse: 83 82  Resp: 18   Temp: 98.6 F (37 C)   SpO2: 96%     History of present illness:  The patient is a 36 year old American female with past medical history significant for morbid obesity, chronic anemia, hypertension, insulin-dependent type 2 diabetes, recurrent headaches, who presented to the ED at [facility] on 05/03/17 with persistent headache and neck pain.  Patient was discharged from [facility] hospital 1 day prior with similar symptoms.  In the ED patient had a lumbar puncture done by interventional radiology which was essentially unremarkable. During hospitalization patient continued to have persistent headaches and neck pain. A cocktail made of IV Reglan, IV Benadryl, and IV Toradol was given with resolution of symptoms. Patient was also treated for AKI with  no prior history of CKD which resolved after IV fluid hydration, sepsis secondary to UTI that was present on admission.   On day of discharge the patient' headache and neck pain resolved. She denied any chest pain, dyspnea, or palpitations. Patient was hemodynamically stable and able to ambulate freely with no dizziness or acute distress.  Patient was advised to follow-up with her primary care provider for posthospital visit and for anemia workup due to her chronic anemia.  Hospital Course:  Active Problems:   Sepsis ([place])   AKI (acute kidney injury) ([place])   Diabetes ([place])   HTN (hypertension)   Morbid obesity ([facility])   Tachycardia   Fever   Bad headache   Type II diabetes mellitus ([place])   Anemia   Procedures:  Lumbar puncture 05/03/2017  Consultations:  None  Discharge Exam: BP (!) 159/88   Pulse 82   Temp 98.6 F (37 C) (Oral)   Resp 18   Ht 5\' 5"  (1.651 m)   Wt 129.3 kg (285 lb)   LMP 04/26/2017 (Approximate)   SpO2 96%   BMI 47.43 kg/m   General: 36 year old African-American morbidly obese female lying in bed in no apparent acute distress breathing normally on room air. Cardiovascular: Regular rate and rhythm with no murmurs rubs or gallops Respiratory: Clear to auscultation with no rhonchi or wheezes  Discharge Instructions You were cared for by a hospitalist during your hospital stay. If you have any questions about your discharge medications or the care you received while you were in the  hospital after you are discharged, you can call the unit and asked to speak with the hospitalist on call if the hospitalist that took care of you is not available. Once you are discharged, your primary care physician will handle any further medical issues. Please note that NO REFILLS for any discharge medications will be authorized once you are discharged, as it is imperative that you return to your primary care physician (or establish a relationship with a primary care physician if  you do not have one) for your aftercare needs so that they can reassess your need for medications and monitor your lab values.   Allergies as of 05/05/2017   No Known Allergies     Medication List    STOP taking these medications   acetaminophen 500 MG tablet Commonly known as:  TYLENOL     TAKE these medications   ciprofloxacin 500 MG tablet Commonly known as:  CIPRO Take 1 tablet (500 mg total) by mouth 2 (two) times daily.   insulin aspart 100 UNIT/ML injection Commonly known as:  novoLOG Inject 5 Units into the skin 3 (three) times daily with meals.   Insulin Glargine 100 UNIT/ML Solostar Pen Commonly known as:  LANTUS Inject 20 Units into the skin daily at 10 pm.   propranolol 20 MG tablet Commonly known as:  INDERAL Take 1 tablet (20 mg total) by mouth 2 (two) times daily.      No Known Allergies Follow-up Information    Lin Landsman, MD Follow up in 3 day(s).   Specialty:  Family Medicine Contact information: [place] [address] [phone]            The results of significant diagnostics from this hospitalization (including imaging, microbiology, ancillary and laboratory) are listed below for reference.    Significant Diagnostic Studies: Dg Chest 2 View  Result Date: 04/30/2017 CLINICAL DATA:  Fever, headache, nausea/vomiting EXAM: CHEST  2 VIEW COMPARISON:  01/31/2017 FINDINGS: Lungs are clear.  No pleural effusion or pneumothorax. The heart is normal in size. Mild degenerative changes of the visualized thoracolumbar spine. IMPRESSION: Normal chest radiographs. Electronically Signed   By: Julian Hy M.D.   On: 04/30/2017 13:17   Ct Head Wo Contrast  Result Date: 04/30/2017 CLINICAL DATA:  Headache EXAM: CT HEAD WITHOUT CONTRAST TECHNIQUE: Contiguous axial images were obtained from the base of the skull through the vertex without intravenous contrast. COMPARISON:  MRI brain dated 03/14/2016 FINDINGS: Brain: No evidence of  acute infarction, hemorrhage, hydrocephalus, extra-axial collection or mass lesion/mass effect. Vascular: No hyperdense vessel or unexpected calcification. Skull: Normal. Negative for fracture or focal lesion. Sinuses/Orbits: The visualized paranasal sinuses are essentially clear. The mastoid air cells are unopacified. Other: None. IMPRESSION: Normal head CT. Electronically Signed   By: Julian Hy M.D.   On: 04/30/2017 13:16   Mr Brain Wo Contrast  Result Date: 05/01/2017 CLINICAL DATA:  Rule out meningitis or encephalitis. Headache and stiff neck. EXAM: MRI HEAD WITHOUT CONTRAST TECHNIQUE: Multiplanar, multiecho pulse sequences of the brain and surrounding structures were obtained without intravenous contrast. COMPARISON:  03/14/2016 FINDINGS: Brain: No infarction, hemorrhage, hydrocephalus, extra-axial collection or mass lesion. No evidence of sulcal or intraventricular debris. No atrophy or white matter disease. Vascular: Major flow voids are preserved. Skull and upper cervical spine: Negative for marrow lesion Sinuses/Orbits: Negative IMPRESSION: Normal brain MRI. A normal study does not exclude the diagnosis of meningitis. Electronically Signed   By: Monte Fantasia M.D.   On: 05/01/2017  07:12   Dg Chest Port 1 View  Result Date: 05/03/2017 CLINICAL DATA:  Sepsis, continued fever and headache. Suspected meningitis. EXAM: PORTABLE CHEST 1 VIEW COMPARISON:  04/30/2017 FINDINGS: AP portable semi upright view of the chest. Stable borderline cardiomegaly. No aortic aneurysm. Clear lungs. No acute osseous abnormality. IMPRESSION: No active disease. Electronically Signed   By: Ashley Royalty M.D.   On: 05/03/2017 18:50   Dg Lumbar Puncture Fluoro Guide  Result Date: 05/03/2017 CLINICAL DATA:  Fever and severe headache.  Suspected meningitis. EXAM: DIAGNOSTIC LUMBAR PUNCTURE UNDER FLUOROSCOPIC GUIDANCE FLUOROSCOPY TIME:  Fluoroscopy Time:  12 seconds is Radiation Exposure Index (if provided by the  fluoroscopic device): 19 mGy Number of Acquired Spot Images: 2 PROCEDURE: Prior studies were reviewed. Time out procedure was performed. Informed consent was obtained from the patient prior to the procedure, including potential complications of headache, allergy, and pain. With the patient prone, the lower back was prepped with Betadine. 1% Lidocaine was used for local anesthesia. Lumbar puncture was performed at the L2-3 level on the right using a 12.5 cm, 20 gauge needle with return of clear, colorless CSF with an opening pressure of 17.5 cm water, measured prone through the needle. 10 ml of CSF were obtained for laboratory studies. The patient tolerated the procedure well and there were no apparent complications. IMPRESSION: Diagnostic lumbar puncture performed without immediate complications. Electronically Signed   By: Richardean Sale M.D.   On: 05/03/2017 15:24    Microbiology: Recent Results (from the past 240 hour(s))  Blood culture (routine x 2)     Status: None (Preliminary result)   Collection Time: 04/30/17 12:50 PM  Result Value Ref Range Status   Specimen Description BLOOD LEFT ARM  Final   Special Requests   Final    BOTTLES DRAWN AEROBIC AND ANAEROBIC Blood Culture adequate volume   Culture   Final    NO GROWTH 4 DAYS Performed at [facility], [address], [place], [place] [address]    Report Status PENDING  Incomplete  Blood culture (routine x 2)     Status: None (Preliminary result)   Collection Time: 04/30/17  3:25 PM  Result Value Ref Range Status   Specimen Description BLOOD LEFT ARM  Final   Special Requests   Final    BOTTLES DRAWN AEROBIC AND ANAEROBIC Blood Culture adequate volume   Culture   Final    NO GROWTH 4 DAYS Performed at [facility], [address], [place], [place] [address]    Report Status PENDING  Incomplete  Blood culture (routine x 2)     Status: None (Preliminary result)   Collection Time: 05/03/17 12:25 PM  Result Value Ref  Range Status   Specimen Description BLOOD RIGHT ARM  Final   Special Requests   Final    BOTTLES DRAWN AEROBIC AND ANAEROBIC Blood Culture results may not be optimal due to an excessive volume of blood received in culture bottles   Culture NO GROWTH 1 DAY  Final   Report Status PENDING  Incomplete  Blood culture (routine x 2)     Status: Abnormal   Collection Time: 05/03/17 12:35 PM  Result Value Ref Range Status   Specimen Description BLOOD RIGHT HAND  Final   Special Requests IN PEDIATRIC BOTTLE Blood Culture adequate volume  Final   Culture  Setup Time   Final    GRAM POSITIVE COCCI IN CLUSTERS IN PEDIATRIC BOTTLE CRITICAL RESULT CALLED TO, READ BACK  BY AND VERIFIED WITH: EHassell Done PHARMD, AT [phone] 05/04/17 BY D. VANHOOK    Culture (A)  Final    STAPHYLOCOCCUS SPECIES (COAGULASE NEGATIVE) THE SIGNIFICANCE OF ISOLATING THIS ORGANISM FROM A SINGLE SET OF BLOOD CULTURES WHEN MULTIPLE SETS ARE DRAWN IS UNCERTAIN. PLEASE NOTIFY THE MICROBIOLOGY DEPARTMENT WITHIN ONE WEEK IF SPECIATION AND SENSITIVITIES ARE REQUIRED.    Report Status 05/05/2017 FINAL  Final  Blood Culture ID Panel (Reflexed)     Status: Abnormal   Collection Time: 05/03/17 12:35 PM  Result Value Ref Range Status   Enterococcus species NOT DETECTED NOT DETECTED Final   Listeria monocytogenes NOT DETECTED NOT DETECTED Final   Staphylococcus species DETECTED (A) NOT DETECTED Final    Comment: Methicillin (oxacillin) susceptible coagulase negative staphylococcus. Possible blood culture contaminant (unless isolated from more than one blood culture draw or clinical case suggests pathogenicity). No antibiotic treatment is indicated for blood  culture contaminants. CRITICAL RESULT CALLED TO, READ BACK BY AND VERIFIED WITH: Ferne Coe PHARMD, AT [phone] 05/04/17 BY D. VANHOOK    Staphylococcus aureus NOT DETECTED NOT DETECTED Final   Methicillin resistance NOT DETECTED NOT DETECTED Final   Streptococcus species NOT DETECTED NOT  DETECTED Final   Streptococcus agalactiae NOT DETECTED NOT DETECTED Final   Streptococcus pneumoniae NOT DETECTED NOT DETECTED Final   Streptococcus pyogenes NOT DETECTED NOT DETECTED Final   Acinetobacter baumannii NOT DETECTED NOT DETECTED Final   Enterobacteriaceae species NOT DETECTED NOT DETECTED Final   Enterobacter cloacae complex NOT DETECTED NOT DETECTED Final   Escherichia coli NOT DETECTED NOT DETECTED Final   Klebsiella oxytoca NOT DETECTED NOT DETECTED Final   Klebsiella pneumoniae NOT DETECTED NOT DETECTED Final   Proteus species NOT DETECTED NOT DETECTED Final   Serratia marcescens NOT DETECTED NOT DETECTED Final   Haemophilus influenzae NOT DETECTED NOT DETECTED Final   Neisseria meningitidis NOT DETECTED NOT DETECTED Final   Pseudomonas aeruginosa NOT DETECTED NOT DETECTED Final   Candida albicans NOT DETECTED NOT DETECTED Final   Candida glabrata NOT DETECTED NOT DETECTED Final   Candida krusei NOT DETECTED NOT DETECTED Final   Candida parapsilosis NOT DETECTED NOT DETECTED Final   Candida tropicalis NOT DETECTED NOT DETECTED Final  Anaerobic culture     Status: None (Preliminary result)   Collection Time: 05/03/17  3:09 PM  Result Value Ref Range Status   Specimen Description CSF  Final   Special Requests NONE  Final   Culture   Final    NO ANAEROBES ISOLATED; CULTURE IN PROGRESS FOR 5 DAYS   Report Status PENDING  Incomplete  CSF culture     Status: None (Preliminary result)   Collection Time: 05/03/17  3:09 PM  Result Value Ref Range Status   Specimen Description CSF  Final   Special Requests NONE  Final   Gram Stain   Final    WBC PRESENT, PREDOMINANTLY MONONUCLEAR NO ORGANISMS SEEN CYTOSPIN SMEAR    Culture NO GROWTH 2 DAYS  Final   Report Status PENDING  Incomplete     Labs: Basic Metabolic Panel:  Recent Labs Lab 05/01/17 0556 05/02/17 0559 05/03/17 1235 05/04/17 0304 05/05/17 0404  NA 138 136 134* 135 135  K 4.1 3.9 3.9 3.8 4.1  CL 108  104 104 106 106  CO2 23 23 20* 21* 21*  GLUCOSE 277* 239* 261* 253* 211*  BUN 13 14 19 20 15   CREATININE 0.99 0.96 1.40* 1.13* 0.99  CALCIUM 8.2* 8.0* 8.3* 7.9* 8.0*  Liver Function Tests:  Recent Labs Lab 04/30/17 1230 05/03/17 1235 05/04/17 0304  AST 20 28 26   ALT 13* 18 18  ALKPHOS 63 71 57  BILITOT 0.4 0.8 0.4  PROT 7.0 6.4* 5.8*  ALBUMIN 3.0* 3.0* 2.6*   No results for input(s): LIPASE, AMYLASE in the last 168 hours. No results for input(s): AMMONIA in the last 168 hours. CBC:  Recent Labs Lab 04/30/17 1230 05/01/17 0556 05/02/17 0559 05/03/17 1235 05/04/17 0304 05/05/17 0404  WBC 8.8 7.8 7.5 12.2* 8.9 7.5  NEUTROABS 4.5 3.9  --  9.8* 5.0 3.5  HGB 9.8* 9.4* 8.8* 9.7* 8.8* 8.5*  HCT 29.4* 27.5* 25.6* 29.1* 26.5* 25.7*  MCV 83.5 82.6 82.1 82.7 82.8 82.1  PLT 393 363 326 350 316 312   Cardiac Enzymes: No results for input(s): CKTOTAL, CKMB, CKMBINDEX, TROPONINI in the last 168 hours. BNP: BNP (last 3 results) No results for input(s): BNP in the last 8760 hours.  ProBNP (last 3 results) No results for input(s): PROBNP in the last 8760 hours.  CBG:  Recent Labs Lab 05/04/17 0117 05/04/17 1645 05/04/17 2056 05/05/17 0827 05/05/17 1304  GLUCAP 221* 253* 233* 236* 185*       Signed:  Kayleen Memos, MD [facility] 05/05/2017, 1:23 PM

## 2017-05-05 NOTE — Progress Notes (Signed)
Jacqueline [name] to be D/C'd to home per MD order.  Discussed with the patient and all questions fully answered.  VSS, Skin clean, dry and intact without evidence of skin break down, no evidence of skin tears noted. IV catheter discontinued intact. Site without signs and symptoms of complications. Dressing and pressure applied.  An After Visit Summary was printed and given to the patient. Patient received note for work and prescription sent to pharmacy.  D/c education completed with patient/family including follow up instructions, medication list, d/c activities limitations if indicated, with other d/c instructions as indicated by MD - patient able to verbalize understanding, all questions fully answered.   Patient instructed to return to ED, call 911, or call MD for any changes in condition.   Patient escorted via [place], and D/C home via private auto.  Morley Kos Price 05/05/2017 2:07 PM

## 2017-05-07 LAB — CSF CULTURE

## 2017-05-07 LAB — CSF CULTURE W GRAM STAIN: Culture: NO GROWTH

## 2017-05-08 LAB — CULTURE, BLOOD (ROUTINE X 2): Culture: NO GROWTH

## 2017-05-08 LAB — ANAEROBIC CULTURE

## 2017-05-23 ENCOUNTER — Encounter (HOSPITAL_BASED_OUTPATIENT_CLINIC_OR_DEPARTMENT_OTHER): Payer: Self-pay | Admitting: Emergency Medicine

## 2017-05-23 ENCOUNTER — Other Ambulatory Visit: Payer: Self-pay

## 2017-05-23 ENCOUNTER — Emergency Department (HOSPITAL_BASED_OUTPATIENT_CLINIC_OR_DEPARTMENT_OTHER): Payer: BLUE CROSS/BLUE SHIELD

## 2017-05-23 ENCOUNTER — Emergency Department (HOSPITAL_BASED_OUTPATIENT_CLINIC_OR_DEPARTMENT_OTHER)
Admission: EM | Admit: 2017-05-23 | Discharge: 2017-05-23 | Disposition: A | Discharge status: Home / Self Care | Payer: BLUE CROSS/BLUE SHIELD | Attending: Emergency Medicine | Admitting: Emergency Medicine

## 2017-05-23 DIAGNOSIS — I10 Essential (primary) hypertension: Secondary | ICD-10-CM | POA: Insufficient documentation

## 2017-05-23 DIAGNOSIS — R05 Cough: Secondary | ICD-10-CM

## 2017-05-23 DIAGNOSIS — R0602 Shortness of breath: Secondary | ICD-10-CM | POA: Insufficient documentation

## 2017-05-23 DIAGNOSIS — E119 Type 2 diabetes mellitus without complications: Secondary | ICD-10-CM | POA: Insufficient documentation

## 2017-05-23 DIAGNOSIS — R110 Nausea: Secondary | ICD-10-CM | POA: Diagnosis not present

## 2017-05-23 DIAGNOSIS — R072 Precordial pain: Secondary | ICD-10-CM | POA: Insufficient documentation

## 2017-05-23 DIAGNOSIS — R059 Cough, unspecified: Secondary | ICD-10-CM

## 2017-05-23 DIAGNOSIS — R079 Chest pain, unspecified: Secondary | ICD-10-CM | POA: Diagnosis not present

## 2017-05-23 LAB — URINALYSIS, ROUTINE W REFLEX MICROSCOPIC
Bilirubin Urine: NEGATIVE
Glucose, UA: 250 mg/dL — AB
Ketones, ur: NEGATIVE mg/dL
Leukocytes, UA: NEGATIVE
Nitrite: NEGATIVE
PH: 6 (ref 5.0–8.0)
Protein, ur: >300 mg/dL — AB
SPECIFIC GRAVITY, URINE: 1.025 (ref 1.005–1.030)

## 2017-05-23 LAB — COMPREHENSIVE METABOLIC PANEL
ALK PHOS: 80 U/L (ref 38–126)
ALT: 12 U/L — ABNORMAL LOW (ref 14–54)
ANION GAP: 8 (ref 5–15)
AST: 18 U/L (ref 15–41)
Albumin: 3 g/dL — ABNORMAL LOW (ref 3.5–5.0)
BILIRUBIN TOTAL: 0.6 mg/dL (ref 0.3–1.2)
BUN: 19 mg/dL (ref 6–20)
CALCIUM: 9.5 mg/dL (ref 8.9–10.3)
CO2: 25 mmol/L (ref 22–32)
Chloride: 105 mmol/L (ref 101–111)
Creatinine, Ser: 1.06 mg/dL — ABNORMAL HIGH (ref 0.44–1.00)
Glucose, Bld: 233 mg/dL — ABNORMAL HIGH (ref 65–99)
Potassium: 3.8 mmol/L (ref 3.5–5.1)
Sodium: 138 mmol/L (ref 135–145)
TOTAL PROTEIN: 6.8 g/dL (ref 6.5–8.1)

## 2017-05-23 LAB — CBC WITH DIFFERENTIAL/PLATELET
BASOS ABS: 0.1 10*3/uL (ref 0.0–0.1)
BASOS PCT: 1 %
EOS ABS: 0.4 10*3/uL (ref 0.0–0.7)
EOS PCT: 4 %
HCT: 28.9 % — ABNORMAL LOW (ref 36.0–46.0)
Hemoglobin: 9.7 g/dL — ABNORMAL LOW (ref 12.0–15.0)
LYMPHS ABS: 4 10*3/uL (ref 0.7–4.0)
Lymphocytes Relative: 36 %
MCH: 28 pg (ref 26.0–34.0)
MCHC: 33.6 g/dL (ref 30.0–36.0)
MCV: 83.5 fL (ref 78.0–100.0)
Monocytes Absolute: 0.7 10*3/uL (ref 0.1–1.0)
Monocytes Relative: 6 %
Neutro Abs: 5.9 10*3/uL (ref 1.7–7.7)
Neutrophils Relative %: 53 %
PLATELETS: 380 10*3/uL (ref 150–400)
RBC: 3.46 MIL/uL — AB (ref 3.87–5.11)
RDW: 13.5 % (ref 11.5–15.5)
WBC: 11.1 10*3/uL — AB (ref 4.0–10.5)

## 2017-05-23 LAB — URINALYSIS, MICROSCOPIC (REFLEX)

## 2017-05-23 LAB — D-DIMER, QUANTITATIVE (NOT AT ARMC): D DIMER QUANT: 1.66 ug{FEU}/mL — AB (ref 0.00–0.50)

## 2017-05-23 LAB — BRAIN NATRIURETIC PEPTIDE: B NATRIURETIC PEPTIDE 5: 32.8 pg/mL (ref 0.0–100.0)

## 2017-05-23 LAB — TROPONIN I

## 2017-05-23 MED ORDER — IOPAMIDOL (ISOVUE-370) INJECTION 76%
150.0000 mL | Freq: Once | INTRAVENOUS | Status: AC | PRN
  Administered 2017-05-23: 150 mL via INTRAVENOUS

## 2017-05-23 MED ORDER — BENZONATATE 100 MG PO CAPS: 100.0000 mg | ORAL_CAPSULE | Freq: Three times a day (TID) | ORAL | 0 refills | Status: DC

## 2017-05-23 MED ORDER — ONDANSETRON 4 MG PO TBDP: 4.0000 mg | ORAL_TABLET | Freq: Three times a day (TID) | ORAL | 0 refills | Status: DC | PRN

## 2017-05-23 NOTE — ED Triage Notes (Signed)
Patient states that she started to have chest tightness and SOB qabout 2 hours ago. Patient is very anxiouys - reports bilateral leg swelling. Was recently released from the [facility]

## 2017-05-23 NOTE — Discharge Instructions (Signed)

## 2017-05-23 NOTE — ED Provider Notes (Signed)
Emergency Department Provider Note   I have reviewed the triage vital signs and the nursing notes.   HISTORY  Chief Complaint Cough   HPI Jacqueline [name] is a 36 y.o. female with PMH of HTN, DM, and obesity presents to the emergency department for evaluation of lower extremity edema with shortness of breath, chest pain, and nausea.  Patient was recently admitted for UTI and acute kidney injury.  She completed antibiotic course and was discharged home.  She reports lower extremity swelling worsening over the past 2 days.  Today while at work she developed a dry cough with some associated tightness in the center of the chest and shortness of breath.  No pleuritic discomfort.  Shortness of breath worse with exertion.  No fevers or chills.  No hemoptysis.  No similar symptoms in the past.    Past Medical History:  Diagnosis Date  . Anemia   . Anxiety   . Chronic bronchitis ([place])   . Hypertension   . Increased frequency of headaches   . Morbid obesity ([place])   . Type II diabetes mellitus [facility])     Patient Active Problem List   Diagnosis Date Noted  . Sepsis ([place]) 05/03/2017    Class: Present on Admission  . AKI (acute kidney injury) ([place]) 05/03/2017    Class: Stage 3  . Neck pain   . Acidosis, metabolic   . Hyponatremia   . Type II diabetes mellitus ([place]) 04/30/2017  . Anemia 04/30/2017  . Staphylococcus aureus bacteremia 03/15/2016  . Streptococcal bacteremia 03/15/2016  . Tachycardia 03/14/2016  . Fever 03/14/2016  . Bad headache 03/14/2016  . Morbid obesity ([place])   . HTN (hypertension) 12/22/2015  . Diabetes ([place]) 01/01/2014    Past Surgical History:  Procedure Laterality Date  . BREAST REDUCTION SURGERY  03/21/2017  . CARDIAC CATHETERIZATION  01/06/2016  . CESAREAN SECTION  08/2014    Current Outpatient Rx  . Order #: [id number] Class: Historical Med  . Order #: [id number] Class: Print  . Order #: [id number] Class: No Print  . Order #: [id number] Class: No Print  .  Order #: [id number] Class: Print  . Order #: [id number] Class: No Print    Allergies Patient has no known allergies.  Family History  Problem Relation Age of Onset  . Diabetes Mother   . Hypertension Mother   . Thyroid disease Mother   . Kidney disease Maternal Grandmother   . Diabetes Maternal Grandmother   . Heart attack Unknown     Social History Social History   Tobacco Use  . Smoking status: Never Smoker  . Smokeless tobacco: Never Used  Substance Use Topics  . Alcohol use: No  . Drug use: No    Review of Systems  Constitutional: No fever/chills Eyes: No visual changes. ENT: No sore throat. Cardiovascular: Positive chest pain and LE edema (bilateral).  Respiratory: Positive shortness of breath. Positive dry cough.  Gastrointestinal: No abdominal pain. Positive nausea, no vomiting.  No diarrhea.  No constipation. Genitourinary: Negative for dysuria. Musculoskeletal: Negative for back pain. Skin: Negative for rash. Neurological: Negative for headaches, focal weakness or numbness.  10-point ROS otherwise negative.  ____________________________________________   PHYSICAL EXAM:  VITAL SIGNS: ED Triage Vitals  Enc Vitals Group     BP 05/23/17 1929 (!) 193/103     Pulse Rate 05/23/17 1929 (!) 119     Resp 05/23/17 1929 (!) 24     Temp 05/23/17 1929 98.7 F (37.1 C)     Temp Source 05/23/17 1929  Oral     SpO2 05/23/17 1929 100 %     Weight 05/23/17 1925 285 lb (129.3 kg)     Height 05/23/17 1925 5\' 5"  (1.651 m)     Pain Score 05/23/17 1924 8   Constitutional: Alert and oriented. Well appearing and in no acute distress. Eyes: Conjunctivae are normal.  Head: Atraumatic. Nose: No congestion/rhinnorhea. Mouth/Throat: Mucous membranes are moist. Neck: No stridor.   Cardiovascular: Tachycardia. Good peripheral circulation. Grossly normal heart sounds.   Respiratory: Normal respiratory effort.  No retractions. Lungs CTAB. Gastrointestinal: Soft and nontender.  No distention.  Musculoskeletal: No lower extremity tenderness. Bilateral 2+ pitting edema. No gross deformities of extremities. Neurologic:  Normal speech and language. No gross focal neurologic deficits are appreciated.  Skin:  Skin is warm, dry and intact. No rash noted.  ____________________________________________   LABS (all labs ordered are listed, but only abnormal results are displayed)  Labs Reviewed  COMPREHENSIVE METABOLIC PANEL - Abnormal; Notable for the following components:      Result Value   Glucose, Bld 233 (*)    Creatinine, Ser 1.06 (*)    Albumin 3.0 (*)    ALT 12 (*)    All other components within normal limits  CBC WITH DIFFERENTIAL/PLATELET - Abnormal; Notable for the following components:   WBC 11.1 (*)    RBC 3.46 (*)    Hemoglobin 9.7 (*)    HCT 28.9 (*)    All other components within normal limits  URINALYSIS, ROUTINE W REFLEX MICROSCOPIC - Abnormal; Notable for the following components:   APPearance HAZY (*)    Glucose, UA 250 (*)    Hgb urine dipstick MODERATE (*)    Protein, ur >300 (*)    All other components within normal limits  D-DIMER, QUANTITATIVE (NOT AT [facility]) - Abnormal; Notable for the following components:   D-Dimer, Quant 1.66 (*)    All other components within normal limits  URINALYSIS, MICROSCOPIC (REFLEX) - Abnormal; Notable for the following components:   Bacteria, UA MANY (*)    Squamous Epithelial / LPF 6-30 (*)    All other components within normal limits  BRAIN NATRIURETIC PEPTIDE  TROPONIN I   ____________________________________________  EKG   EKG Interpretation  Date/Time:  Tuesday May 23 2017 19:37:19 EST Ventricular Rate:  116 PR Interval:  144 QRS Duration: 78 QT Interval:  338 QTC Calculation: 469 R Axis:   100 Text Interpretation:  Sinus tachycardia Rightward axis Cannot rule out Anterior infarct , age undetermined Abnormal ECG No STEMI.  Confirmed by Nanda Quinton [phone]) on 05/23/2017 8:08:06 PM         ____________________________________________  RADIOLOGY  Dg Chest 2 View  Result Date: 05/23/2017 CLINICAL DATA:  EKG Chest pain x 2-3 hours; H/O HTN, morbid obesity and Type 2 diabetes; recent release from hospital EXAM: CHEST  2 VIEW COMPARISON:  05/03/2017 FINDINGS: Shallow lung inflation. Heart size is mildly enlarged. There is mild perihilar airspace filling, associated with air bronchograms. Small nodular opacity overlies the left lung apex, possibly artifactual. Attention to this region is recommended on follow-up exams. IMPRESSION: 1. Mild cardiomegaly and possible mild perihilar edema. 2. Possible left apical nodule versus artifact. 3. Recommend follow-up when the patient is clinically stable. Consider repeat PA and lateral chest x-ray. Electronically Signed   By: Nolon Nations M.D.   On: 05/23/2017 20:21   Ct Angio Chest Pe W And/or Wo Contrast  Result Date: 05/23/2017 CLINICAL DATA:  Chest tightness and shortness of breath  for 5 hours. Mid upper chest pain. D-dimer equals 1.66. EXAM: CT ANGIOGRAPHY CHEST WITH CONTRAST TECHNIQUE: Multidetector CT imaging of the chest was performed using the standard protocol during bolus administration of intravenous contrast. Multiplanar CT image reconstructions and MIPs were obtained to evaluate the vascular anatomy. CONTRAST:  150 cc Isovue 370 COMPARISON:  05/23/2017 chest x-ray FINDINGS: Cardiovascular: Pulmonary arteries are well opacified. There is no acute pulmonary embolus. The main pulmonary artery is enlarged, measuring 3.1 cm, compatible with pulmonary arterial hypertension. The thoracic aorta is not aneurysmal. No significant arterial calcification of the thoracic aorta.Heart size is normal. No significant coronary artery calcifications. No pericardial effusion. Mediastinum/Nodes: The visualized portion of the thyroid gland has a normal appearance. No mediastinal, hilar, or axillary adenopathy. Lungs/Pleura: Lungs are clear. No pleural  effusion or pneumothorax. Upper Abdomen: No acute abnormality. Musculoskeletal: Moderate, asymmetric slowing thoracic osteophytes, consistent with diffuse idiopathic skeletal hyperostosis. Review of the MIP images confirms the above findings. IMPRESSION: 1. Technically adequate exam showing no acute pulmonary embolus. 2. Enlarged pulmonary artery compatible with pulmonary arterial hypertension. 3. DISH of the thoracic spine. Electronically Signed   By: Nolon Nations M.D.   On: 05/23/2017 22:26    ____________________________________________   PROCEDURES  Procedure(s) performed:   Procedures  None ____________________________________________   INITIAL IMPRESSION / ASSESSMENT AND PLAN / ED COURSE  Pertinent labs & imaging results that were available during my care of the patient were reviewed by me and considered in my medical decision making (see chart for details).  Patient presents with chest tightness and shortness of breath.  She has some associated bilateral lower extremity leg swelling.  Symptoms have been gradually worsening but shortness of breath was more sudden onset.  She had some associated nausea.  EKG shows sinus tachycardia with no acute ischemia.  Plan for labs, chest x-ray, reassess.  Labs are largely unremarkable with the exception of elevated d-dimer. CTA negative for PE or other acute chest process. Plan for Cardiology and PCP follow up for LE edema. Patient with last ECHO showing dCHF. No evidence of volume overload at this time. Patient with cough and nausea possibly from viral process. Will discharge with Tessalon and Zofran.   At this time, I do not feel there is any life-threatening condition present. I have reviewed and discussed all results (EKG, imaging, lab, urine as appropriate), exam findings with patient. I have reviewed nursing notes and appropriate previous records.  I feel the patient is safe to be discharged home without further emergent workup.  Discussed usual and customary return precautions. Patient and family (if present) verbalize understanding and are comfortable with this plan.  Patient will follow-up with their primary care provider. If they do not have a primary care provider, information for follow-up has been provided to them. All questions have been answered.  ____________________________________________  FINAL CLINICAL IMPRESSION(S) / ED DIAGNOSES  Final diagnoses:  Precordial chest pain  Shortness of breath  Cough  Nausea     MEDICATIONS GIVEN DURING THIS VISIT:  Medications  iopamidol (ISOVUE-370) 76 % injection 150 mL (150 mLs Intravenous Contrast Given 05/23/17 2155)     NEW OUTPATIENT MEDICATIONS STARTED DURING THIS VISIT:  Zofran Tessalon   Note:  This document was prepared using Dragon voice recognition software and may include unintentional dictation errors.  Nanda Quinton, MD Emergency Medicine    Cigi Bega, [facility], MD 05/24/17 1116

## 2017-05-23 NOTE — ED Notes (Signed)
Pt given cup to attempt urine sample. 

## 2017-05-24 ENCOUNTER — Encounter: Payer: Self-pay | Admitting: *Deleted

## 2017-05-24 NOTE — Progress Notes (Signed)
Cardiology Office Note   Date:  05/25/2017   ID:  Jacqueline [name], DOB 1980/11/12, MRN [id number]  PCP:  Patient, No Pcp Per  Cardiologist:   Jacqueline Breeding, MD    Chief Complaint  Patient presents with  . Atrial Fibrillation      History of Present Illness: Jacqueline [name] is a 36 y.o. female who presents for evaluation of SOB.  She was seen in the ED yesterday.  I reviewed these records for this visit.  She has sporadic chest pain that is sharp.  She has some chronic shortness of breath.  This happens when she walks on level ground.  She might have this also while seated.  She sleep slightly propped up.  I do note that her BNP level [name] year was okay.  There was an echo [name] year with NL EF and some evidence of LV diastolic dysfunction.   This was during a hospitalization wth chest pain.  Cardiac cath at that time was normal.     She was in the emergency room recently with chest discomfort and shortness of breath.  She had a CT that demonstrated no significant abnormalities.  There was no pulmonary emboli on this and no DVT on Doppler.  There was a suggestion of some elevated pulmonary pressure.   Past Medical History:  Diagnosis Date  . Anemia   . Anxiety   . Chronic bronchitis ([place])   . Hypertension   . Increased frequency of headaches   . Morbid obesity ([place])   . Type II diabetes mellitus ([place])     Past Surgical History:  Procedure Laterality Date  . BREAST REDUCTION SURGERY  03/21/2017  . CARDIAC CATHETERIZATION  01/06/2016  . CESAREAN SECTION  08/2014     Current Outpatient Medications  Medication Sig Dispense Refill  . benzonatate (TESSALON) 100 MG capsule Take 1 capsule (100 mg total) every 8 (eight) hours by mouth. 21 capsule 0  . insulin aspart (NOVOLOG) 100 UNIT/ML injection Inject 5 Units into the skin 3 (three) times daily with meals. 10 mL 0  . Insulin Glargine (LANTUS) 100 UNIT/ML Solostar Pen Inject 20 Units into the skin daily at 10 pm. 15 mL 0  . olmesartan  (BENICAR) 5 MG tablet Take daily by mouth.    . ondansetron (ZOFRAN ODT) 4 MG disintegrating tablet Take 1 tablet (4 mg total) every 8 (eight) hours as needed by mouth for nausea or vomiting. 20 tablet 0  . propranolol (INDERAL) 20 MG tablet Take 1 tablet (20 mg total) by mouth 2 (two) times daily. 60 tablet 0  . amLODipine (NORVASC) 5 MG tablet Take 1 tablet (5 mg total) daily by mouth. 90 tablet 3   No current facility-administered medications for this visit.     Allergies:   Patient has no known allergies.     ROS:  Please see the history of present illness.   Otherwise, review of systems are positive for none.   All other systems are reviewed and negative.    PHYSICAL EXAM: VS:  BP (!) 170/100 (BP Location: Left Arm, Cuff Size: Large)   Pulse (!) 110   Ht 5\' 5"  (1.651 m)   Wt 288 lb (130.6 kg)   LMP 04/30/2017 Comment: NCP per pt//a.c.  BMI 47.93 kg/m  , BMI Body mass index is 47.93 kg/m. GENERAL:  Well appearing HEENT:  Pupils equal round and reactive, fundi not visualized, oral mucosa unremarkable NECK:  No jugular venous distention, waveform within normal limits,  carotid upstroke brisk and symmetric, no bruits, no thyromegaly LYMPHATICS:  No cervical, inguinal adenopathy LUNGS:  Clear to auscultation bilaterally BACK:  No CVA tenderness CHEST:  Unremarkable HEART:  PMI not displaced or sustained,S1 and S2 within normal limits, no S3, no S4, no clicks, no rubs, no murmurs ABD:  Flat, positive bowel sounds normal in frequency in pitch, no bruits, no rebound, no guarding, no midline pulsatile mass, no hepatomegaly, no splenomegaly EXT:  2 plus pulses throughout, no edema, no cyanosis no clubbing SKIN:  No rashes no nodules NEURO:  Cranial nerves II through XII grossly intact, motor grossly intact throughout PSYCH:  Cognitively intact, oriented to person place and time    EKG:  EKG is ordered today. The ekg ordered today demonstrates sinus tachycardia, rate 110, axis  within normal limits, intervals within normal limits, no acute changes.   Recent Labs: 05/23/2017: ALT 12; B Natriuretic Peptide 32.8; BUN 19; Creatinine, Ser 1.06; Hemoglobin 9.7; Platelets 380; Potassium 3.8; Sodium 138    Lipid Panel    Component Value Date/Time   CHOL 135 12/23/2015 0357   TRIG 202 (H) 12/23/2015 0357   HDL 31 (L) 12/23/2015 0357   CHOLHDL 4.4 12/23/2015 0357   VLDL 40 12/23/2015 0357   LDLCALC 64 12/23/2015 0357     Lab Results  Component Value Date   HGBA1C 10.8 (H) 05/01/2017    Wt Readings from [name] 3 Encounters:  05/25/17 288 lb (130.6 kg)  05/23/17 285 lb (129.3 kg)  05/03/17 285 lb (129.3 kg)      Other studies Reviewed: Additional studies/ records that were reviewed today include: Hospital records, ED records. Review of the above records demonstrates:  Please see elsewhere in the note.     ASSESSMENT AND PLAN:   Chest pain:   Her chest pain is atypical.  She had normal cath [name] year.  No further cardiovascular testing is indicated.   HTN: This is not well controlled.  I am going to add Norvasc 5 mg daily.  She is going to get a blood pressure cuff and keep a diary.    SOB: There was a suggestion of pulmonary hypertension or elevated pressures noted on the CT.  This was not noted [name] year on the echo.  I will repeat an echocardiogram.  I suspect that this is multifactorial related to weight and deconditioning.  There may be some element of diastolic dysfunction.  DM: We talked at length about diet and exercise.  Her hemoglobin A1c is as above.  We discussed target.  She is establishing with a new primary care and understands the importance of this.    Morbid obesity:  We had a very long and specific instruction about diet and.    CKD II: She will follow this with her primary care and will work on risk reduction.  ANEMIA: This is noted in the hospital recently and she is to follow-up with her primary provider.  Current medicines are  reviewed at length with the patient today.  The patient does not have concerns regarding medicines.  The following changes have been made:  no change  Labs/ tests ordered today include: None  Orders Placed This Encounter  Procedures  . EKG 12-Lead  . ECHOCARDIOGRAM COMPLETE     Disposition:   FU with me in 2 months.     Signed, Jacqueline Breeding, MD  05/25/2017 9:41 AM    [place]

## 2017-05-25 ENCOUNTER — Ambulatory Visit (INDEPENDENT_AMBULATORY_CARE_PROVIDER_SITE_OTHER): Payer: BLUE CROSS/BLUE SHIELD | Admitting: Cardiology

## 2017-05-25 ENCOUNTER — Encounter: Payer: Self-pay | Admitting: Cardiology

## 2017-05-25 VITALS — BP 170/100 | HR 110 | Ht 65.0 in | Wt 288.0 lb

## 2017-05-25 DIAGNOSIS — R0602 Shortness of breath: Secondary | ICD-10-CM | POA: Diagnosis not present

## 2017-05-25 DIAGNOSIS — R079 Chest pain, unspecified: Secondary | ICD-10-CM | POA: Diagnosis not present

## 2017-05-25 DIAGNOSIS — I10 Essential (primary) hypertension: Secondary | ICD-10-CM

## 2017-05-25 DIAGNOSIS — I480 Paroxysmal atrial fibrillation: Secondary | ICD-10-CM | POA: Insufficient documentation

## 2017-05-25 MED ORDER — AMLODIPINE BESYLATE 5 MG PO TABS: 5.0000 mg | ORAL_TABLET | Freq: Every day | ORAL | 3 refills | Status: DC

## 2017-05-25 NOTE — Patient Instructions (Signed)
Medication Instructions:  START- Amlodipine 5 mg daily  If you need a refill on your cardiac medications before your next appointment, please call your pharmacy.  Labwork: None Ordered   Testing/Procedures: Your physician has requested that you have an echocardiogram. Echocardiography is a painless test that uses sound waves to create images of your heart. It provides your doctor with information about the size and shape of your heart and how well your heart's chambers and valves are working. This procedure takes approximately one hour. There are no restrictions for this procedure.   Follow-Up: Your physician wants you to follow-up in: 2 Months.    Thank you for choosing [facility] at [facility]!!

## 2017-05-30 ENCOUNTER — Other Ambulatory Visit (HOSPITAL_COMMUNITY): Payer: BLUE CROSS/BLUE SHIELD

## 2017-05-30 DIAGNOSIS — I10 Essential (primary) hypertension: Secondary | ICD-10-CM | POA: Diagnosis not present

## 2017-05-30 DIAGNOSIS — R000 Tachycardia, unspecified: Secondary | ICD-10-CM | POA: Diagnosis not present

## 2017-05-30 DIAGNOSIS — D6489 Other specified anemias: Secondary | ICD-10-CM | POA: Diagnosis not present

## 2017-05-30 DIAGNOSIS — J309 Allergic rhinitis, unspecified: Secondary | ICD-10-CM | POA: Insufficient documentation

## 2017-05-30 DIAGNOSIS — G4733 Obstructive sleep apnea (adult) (pediatric): Secondary | ICD-10-CM | POA: Diagnosis not present

## 2017-05-30 DIAGNOSIS — Z6841 Body Mass Index (BMI) 40.0 and over, adult: Secondary | ICD-10-CM | POA: Insufficient documentation

## 2017-05-30 DIAGNOSIS — E1165 Type 2 diabetes mellitus with hyperglycemia: Secondary | ICD-10-CM | POA: Diagnosis not present

## 2017-06-01 ENCOUNTER — Ambulatory Visit (HOSPITAL_COMMUNITY): Payer: BLUE CROSS/BLUE SHIELD | Attending: Cardiology

## 2017-06-01 DIAGNOSIS — E1121 Type 2 diabetes mellitus with diabetic nephropathy: Secondary | ICD-10-CM | POA: Insufficient documentation

## 2017-06-01 DIAGNOSIS — R7989 Other specified abnormal findings of blood chemistry: Secondary | ICD-10-CM | POA: Insufficient documentation

## 2017-06-01 DIAGNOSIS — D75839 Thrombocytosis, unspecified: Secondary | ICD-10-CM | POA: Insufficient documentation

## 2017-06-01 LAB — FUNGUS CULTURE WITH STAIN

## 2017-06-01 LAB — FUNGUS CULTURE RESULT

## 2017-06-01 LAB — FUNGAL ORGANISM REFLEX

## 2017-06-13 ENCOUNTER — Emergency Department (HOSPITAL_BASED_OUTPATIENT_CLINIC_OR_DEPARTMENT_OTHER): Payer: BLUE CROSS/BLUE SHIELD

## 2017-06-13 ENCOUNTER — Emergency Department (HOSPITAL_BASED_OUTPATIENT_CLINIC_OR_DEPARTMENT_OTHER)
Admission: EM | Admit: 2017-06-13 | Discharge: 2017-06-13 | Disposition: A | Discharge status: Home / Self Care | Payer: BLUE CROSS/BLUE SHIELD | Attending: Physician Assistant | Admitting: Physician Assistant

## 2017-06-13 ENCOUNTER — Encounter (HOSPITAL_BASED_OUTPATIENT_CLINIC_OR_DEPARTMENT_OTHER): Payer: Self-pay | Admitting: *Deleted

## 2017-06-13 ENCOUNTER — Other Ambulatory Visit: Payer: Self-pay

## 2017-06-13 DIAGNOSIS — R0602 Shortness of breath: Secondary | ICD-10-CM | POA: Diagnosis not present

## 2017-06-13 DIAGNOSIS — Z794 Long term (current) use of insulin: Secondary | ICD-10-CM | POA: Insufficient documentation

## 2017-06-13 DIAGNOSIS — I10 Essential (primary) hypertension: Secondary | ICD-10-CM | POA: Diagnosis not present

## 2017-06-13 DIAGNOSIS — R739 Hyperglycemia, unspecified: Secondary | ICD-10-CM

## 2017-06-13 DIAGNOSIS — R1011 Right upper quadrant pain: Secondary | ICD-10-CM | POA: Diagnosis not present

## 2017-06-13 DIAGNOSIS — R000 Tachycardia, unspecified: Secondary | ICD-10-CM | POA: Insufficient documentation

## 2017-06-13 DIAGNOSIS — E1165 Type 2 diabetes mellitus with hyperglycemia: Secondary | ICD-10-CM | POA: Diagnosis not present

## 2017-06-13 DIAGNOSIS — Z79899 Other long term (current) drug therapy: Secondary | ICD-10-CM | POA: Diagnosis not present

## 2017-06-13 DIAGNOSIS — E860 Dehydration: Secondary | ICD-10-CM | POA: Diagnosis not present

## 2017-06-13 LAB — URINALYSIS, MICROSCOPIC (REFLEX)

## 2017-06-13 LAB — CBC WITH DIFFERENTIAL/PLATELET
BASOS ABS: 0.1 10*3/uL (ref 0.0–0.1)
BASOS PCT: 1 %
EOS ABS: 0.4 10*3/uL (ref 0.0–0.7)
EOS PCT: 3 %
HCT: 29.3 % — ABNORMAL LOW (ref 36.0–46.0)
HEMOGLOBIN: 9.9 g/dL — AB (ref 12.0–15.0)
Lymphocytes Relative: 36 %
Lymphs Abs: 3.7 10*3/uL (ref 0.7–4.0)
MCH: 27.7 pg (ref 26.0–34.0)
MCHC: 33.8 g/dL (ref 30.0–36.0)
MCV: 81.8 fL (ref 78.0–100.0)
Monocytes Absolute: 0.5 10*3/uL (ref 0.1–1.0)
Monocytes Relative: 5 %
NEUTROS ABS: 5.6 10*3/uL (ref 1.7–7.7)
NEUTROS PCT: 55 %
PLATELETS: 362 10*3/uL (ref 150–400)
RBC: 3.58 MIL/uL — AB (ref 3.87–5.11)
RDW: 13.3 % (ref 11.5–15.5)
WBC: 10.2 10*3/uL (ref 4.0–10.5)

## 2017-06-13 LAB — COMPREHENSIVE METABOLIC PANEL
ALK PHOS: 68 U/L (ref 38–126)
ALT: 15 U/L (ref 14–54)
ANION GAP: 8 (ref 5–15)
AST: 18 U/L (ref 15–41)
Albumin: 3 g/dL — ABNORMAL LOW (ref 3.5–5.0)
BILIRUBIN TOTAL: 0.3 mg/dL (ref 0.3–1.2)
BUN: 27 mg/dL — ABNORMAL HIGH (ref 6–20)
CALCIUM: 9.1 mg/dL (ref 8.9–10.3)
CO2: 21 mmol/L — ABNORMAL LOW (ref 22–32)
Chloride: 103 mmol/L (ref 101–111)
Creatinine, Ser: 1.23 mg/dL — ABNORMAL HIGH (ref 0.44–1.00)
GFR calc non Af Amer: 55 mL/min — ABNORMAL LOW (ref >60)
Glucose, Bld: 383 mg/dL — ABNORMAL HIGH (ref 65–99)
Potassium: 4 mmol/L (ref 3.5–5.1)
Sodium: 132 mmol/L — ABNORMAL LOW (ref 135–145)
TOTAL PROTEIN: 7 g/dL (ref 6.5–8.1)

## 2017-06-13 LAB — LIPASE, BLOOD: LIPASE: 45 U/L (ref 11–51)

## 2017-06-13 LAB — URINALYSIS, ROUTINE W REFLEX MICROSCOPIC
BILIRUBIN URINE: NEGATIVE
Glucose, UA: >=500 mg/dL — AB
KETONES UR: NEGATIVE mg/dL
Leukocytes, UA: NEGATIVE
NITRITE: NEGATIVE
PH: 6 (ref 5.0–8.0)
Protein, ur: 100 mg/dL — AB
SPECIFIC GRAVITY, URINE: 1.015 (ref 1.005–1.030)

## 2017-06-13 LAB — D-DIMER, QUANTITATIVE (NOT AT ARMC): D DIMER QUANT: 0.42 ug{FEU}/mL (ref 0.00–0.50)

## 2017-06-13 LAB — PREGNANCY, URINE: Preg Test, Ur: NEGATIVE

## 2017-06-13 LAB — TROPONIN I

## 2017-06-13 MED ORDER — SODIUM CHLORIDE 0.9 % IV BOLUS (SEPSIS)
1000.0000 mL | Freq: Once | INTRAVENOUS | Status: AC
  Administered 2017-06-13: 1000 mL via INTRAVENOUS

## 2017-06-13 MED ORDER — IBUPROFEN 400 MG PO TABS
600.0000 mg | ORAL_TABLET | Freq: Once | ORAL | Status: AC
  Administered 2017-06-13: 600 mg via ORAL
  Filled 2017-06-13: qty 1

## 2017-06-13 MED ORDER — PROPRANOLOL HCL 20 MG PO TABS: 20.0000 mg | ORAL_TABLET | Freq: Two times a day (BID) | ORAL | 0 refills | Status: DC

## 2017-06-13 NOTE — ED Triage Notes (Addendum)
pt c/o upper right abd pain abd right upper back pain x 3 days , denies injury, seen here 11/6 for same and slo f/u with PMD

## 2017-06-13 NOTE — ED Notes (Signed)
ED Provider at bedside. 

## 2017-06-13 NOTE — ED Provider Notes (Signed)
[facility] EMERGENCY DEPARTMENT Provider Note   CSN: [id number] Arrival date & time: 06/13/17  1329     History   Chief Complaint Chief Complaint  Patient presents with  . Abdominal Pain    HPI Jacqueline [name] is a 36 y.o. female.  HPI  Patient is a 36 year old female presenting with chest pain and abdominal pain.  Patient reports that when she takes deep breath she is pain under her right diaphragm that radiates to her back.  Patient reports it is mostly worse with taking deep breaths.  She does report mild cough.  No real shortness of breath.  She also has tenderness in her right upper quadrant she reports.  She has had occasional vomiting in the last several days.  Patient's past medical history is significant for type 2 diabetes, anxiety, headaches, obesity and recent hospitalization for sepsis 2 months ago.  She reports going to primary care last week and been diagnosed with sinus infection some symptomatic over-the-counter care.  Past Medical History:  Diagnosis Date  . Anemia   . Anxiety   . Chronic bronchitis ([place])   . Hypertension   . Increased frequency of headaches   . Morbid obesity ([place])   . Type II diabetes mellitus [facility])     Patient Active Problem List   Diagnosis Date Noted  . Atrial fibrillation, permanent ([place]) 05/25/2017  . Sepsis ([place]) 05/03/2017    Class: Present on Admission  . AKI (acute kidney injury) ([place]) 05/03/2017    Class: Stage 3  . Neck pain   . Acidosis, metabolic   . Hyponatremia   . Type II diabetes mellitus ([place]) 04/30/2017  . Anemia 04/30/2017  . Staphylococcus aureus bacteremia 03/15/2016  . Streptococcal bacteremia 03/15/2016  . Tachycardia 03/14/2016  . Fever 03/14/2016  . Bad headache 03/14/2016  . Morbid obesity ([place])   . HTN (hypertension) 12/22/2015  . Diabetes ([place]) 01/01/2014    Past Surgical History:  Procedure Laterality Date  . BREAST REDUCTION SURGERY  03/21/2017  . CARDIAC CATHETERIZATION   01/06/2016  . CARDIAC CATHETERIZATION N/A 01/06/2016   Procedure: Left Heart Cath and Coronary Angiography;  Surgeon: Wellington Hampshire, MD;  Location: [facility];  Service: Cardiovascular;  Laterality: N/A;  . CESAREAN SECTION  08/2014    OB History    Gravida Para Term Preterm AB Living   1             SAB TAB Ectopic Multiple Live Births                   Home Medications    Prior to Admission medications   Medication Sig Start Date End Date Taking? Authorizing Provider  amLODipine (NORVASC) 5 MG tablet Take 1 tablet (5 mg total) daily by mouth. 05/25/17 08/23/17  Minus Breeding, MD  benzonatate (TESSALON) 100 MG capsule Take 1 capsule (100 mg total) every 8 (eight) hours by mouth. 05/23/17   Long, Wonda Olds, MD  insulin aspart (NOVOLOG) 100 UNIT/ML injection Inject 5 Units into the skin 3 (three) times daily with meals. 05/02/17   Donne Hazel, MD  Insulin Glargine (LANTUS) 100 UNIT/ML Solostar Pen Inject 20 Units into the skin daily at 10 pm. 05/02/17   Donne Hazel, MD  olmesartan (BENICAR) 5 MG tablet Take daily by mouth.    [provider]  ondansetron (ZOFRAN ODT) 4 MG disintegrating tablet Take 1 tablet (4 mg total) every 8 (eight) hours as needed by mouth for  nausea or vomiting. 05/23/17   Long, Wonda Olds, MD  propranolol (INDERAL) 20 MG tablet Take 1 tablet (20 mg total) by mouth 2 (two) times daily. 05/02/17   Donne Hazel, MD    Family History Family History  Problem Relation Age of Onset  . Diabetes Mother   . Hypertension Mother   . Thyroid disease Mother   . Kidney disease Maternal Grandmother   . Diabetes Maternal Grandmother   . Heart attack Unknown     Social History Social History   Tobacco Use  . Smoking status: Never Smoker  . Smokeless tobacco: Never Used  Substance Use Topics  . Alcohol use: No  . Drug use: No     Allergies   Patient has no known allergies.   Review of Systems Review of Systems  Constitutional: Negative  for activity change, fatigue and fever.  Respiratory: Positive for cough. Negative for shortness of breath.   Cardiovascular: Positive for chest pain.  Gastrointestinal: Positive for abdominal pain, nausea and vomiting. Negative for diarrhea.  Genitourinary: Negative for dysuria.  Neurological: Positive for light-headedness.  All other systems reviewed and are negative.    Physical Exam Updated Vital Signs BP (!) 167/101 (BP Location: Left Arm)   Pulse (!) 124   Temp 98.9 F (37.2 C) (Oral)   Resp 18   Ht 5\' 5"  (1.651 m)   Wt 130.6 kg (288 lb)   LMP 06/01/2017   SpO2 100%   BMI 47.93 kg/m   Physical Exam  Constitutional: She is oriented to person, place, and time. She appears well-developed and well-nourished.  HENT:  Head: Normocephalic and atraumatic.  Eyes: Right eye exhibits no discharge.  Cardiovascular: Regular rhythm.  Tachycardic.  Pulmonary/Chest: Breath sounds normal. No stridor. No respiratory distress.  Mild tachypnea.  Abdominal: Normal appearance. There is tenderness in the right upper quadrant. There is no CVA tenderness.  Mild right upper quadrant tenderness.  Genitourinary: Guaiac stool:    Neurological: She is oriented to person, place, and time.  Skin: Skin is warm and dry. She is not diaphoretic.  Psychiatric: She has a normal mood and affect.  Nursing note and vitals reviewed.    ED Treatments / Results  Labs (all labs ordered are listed, but only abnormal results are displayed) Labs Reviewed  COMPREHENSIVE METABOLIC PANEL - Abnormal; Notable for the following components:      Result Value   Sodium 132 (*)    CO2 21 (*)    Glucose, Bld 383 (*)    BUN 27 (*)    Creatinine, Ser 1.23 (*)    Albumin 3.0 (*)    GFR calc non Af Amer 55 (*)    All other components within normal limits  CBC WITH DIFFERENTIAL/PLATELET - Abnormal; Notable for the following components:   RBC 3.58 (*)    Hemoglobin 9.9 (*)    HCT 29.3 (*)    All other components  within normal limits  URINALYSIS, ROUTINE W REFLEX MICROSCOPIC - Abnormal; Notable for the following components:   Glucose, UA >=500 (*)    Hgb urine dipstick SMALL (*)    Protein, ur 100 (*)    All other components within normal limits  URINALYSIS, MICROSCOPIC (REFLEX) - Abnormal; Notable for the following components:   Bacteria, UA FEW (*)    Squamous Epithelial / LPF 0-5 (*)    All other components within normal limits  LIPASE, BLOOD  D-DIMER, QUANTITATIVE (NOT AT [facility])  PREGNANCY, URINE  TROPONIN I  EKG  EKG Interpretation  Date/Time:  Tuesday June 13 2017 15:26:25 EST Ventricular Rate:  122 PR Interval:    QRS Duration: 95 QT Interval:  322 QTC Calculation: 459 R Axis:   50 Text Interpretation:  Sinus tachycardia Low voltage, precordial leads Baseline wander in lead(s) II III aVF Normal sinus rhythm Confirmed by Zenovia Jarred [id number]) on 06/13/2017 3:28:59 PM       Radiology Dg Chest 2 View  Result Date: 06/13/2017 CLINICAL DATA:  Chest pain, shortness of breath. EXAM: CHEST  2 VIEW COMPARISON:  Radiographs of May 23, 2017. FINDINGS: The heart size and mediastinal contours are within normal limits. Both lungs are clear. No pneumothorax or pleural effusion is noted. The visualized skeletal structures are unremarkable. IMPRESSION: No active cardiopulmonary disease. Electronically Signed   By: Marijo Conception, M.D.   On: 06/13/2017 14:40   US Abdomen Limited Ruq  Result Date: 06/13/2017 CLINICAL DATA:  Right upper quadrant pain and back pain for 3 days EXAM: ULTRASOUND ABDOMEN LIMITED RIGHT UPPER QUADRANT COMPARISON:  CT abdomen pelvis of 11/21/2015 FINDINGS: Gallbladder: The gallbladder is visualized and no gallstones are noted. There is no pain over the gallbladder with compression. Common bile duct: Diameter: The common bile duct is normal measuring 2.6 mm in diameter. Liver: The echogenicity of the liver parenchyma is somewhat increased consistent with  fatty infiltration. No focal hepatic abnormality is noted. Portal vein is patent on color Doppler imaging with normal direction of blood flow towards the liver. IMPRESSION: 1. No gallstones.  No ductal dilatation. 2. Slightly echogenic liver parenchyma may indicate fatty infiltration. No focal hepatic abnormality. Electronically Signed   By: Ivar Drape M.D.   On: 06/13/2017 14:53    Procedures Procedures (including critical care time)  Medications Ordered in ED Medications  sodium chloride 0.9 % bolus 1,000 mL (not administered)  sodium chloride 0.9 % bolus 1,000 mL (1,000 mLs Intravenous New Bag/Given 06/13/17 1446)     Initial Impression / Assessment and Plan / ED Course  I have reviewed the triage vital signs and the nursing notes.  Pertinent labs & imaging results that were available during my care of the patient were reviewed by me and considered in my medical decision making (see chart for details).     Patient is a 36 year old female presenting with chest pain and abdominal pain.  Patient reports that when she takes deep breath she is pain under her right diaphragm that radiates to her back.  Patient reports it is mostly worse with taking deep breaths.  She does report mild cough.  No real shortness of breath.  She also has tenderness in her right upper quadrant she reports.  She has had occasional vomiting in the last several days.  Patient's past medical history is significant for type 2 diabetes, anxiety, headaches, obesity and recent hospitalization for sepsis 2 months ago.  She reports going to primary care last week and been diagnosed with sinus infection some symptomatic over-the-counter care.   2:25 PM Patient has symptoms are multiple different systems.  We will try to address them all.  We will get chest x-ray to address her shortness of breath.  As well as a d-dimer.  For her right upper quadrant pain, will get ultrasound.  We will get baseline labs.  Give her fluids  for her tachycardia.  And reassess.   3:13 PM Patient's right upper ultrasound is negative.  We will give a liter of fluid.  Patient does not have any  evidence of DKA.  No anion gap.  D-dimer is negative.  Chest x-ray is normal.  Suspect dehydration.  Give second liter fluid.  P.o. Challenge.  Because of patient's normal labs, do not suspect any intra-abdominal issues.  Patient's EKG shows evident no evidence of pericarditis.  Ultrasounds note shows no evidence of cholelithiasis.  I think likely muscle spasm is the most likely etiology.  Will have patient stay hydrated at home  Final Clinical Impressions(s) / ED Diagnoses   Final diagnoses:  RUQ pain  Dehydration    ED Discharge Orders    None       Macarthur Critchley, MD 06/13/17 1530

## 2017-06-13 NOTE — ED Notes (Signed)
Pt ambulatory to bathroom for urine sample.

## 2017-06-13 NOTE — ED Notes (Signed)
Pt to [place] ambulatory without assistance.

## 2017-06-13 NOTE — ED Provider Notes (Signed)
Patient has received IV fluids.  Her heart rate has remained the same in the 110s and sometimes low 120s.  Still describes her back/abdominal pain as an 8/10.  However her lab work is at baseline or improved from most recent check.  She recently had a CT that was negative for PE and has a normal d-dimer now.  I do not think repeat imaging would be beneficial looking for PE.  She states she is chronically tachycardic and her typical heart rate is 110.  Noted that she is on propranolol but she does not think she is supposed to be taking this.  However multiple charts have her on this.  We will give her a prescription and have her check her home meds, and otherwise she probably needs to start it as this likely would help somewhat with both her blood pressure and her heart rate.  Follow-up closely with PCP, discussed return precautions.   Sherwood Gambler, MD 06/13/17 [phone]

## 2017-06-15 ENCOUNTER — Other Ambulatory Visit: Payer: Self-pay

## 2017-06-15 ENCOUNTER — Ambulatory Visit (HOSPITAL_COMMUNITY): Payer: BLUE CROSS/BLUE SHIELD | Attending: Cardiology

## 2017-06-15 ENCOUNTER — Encounter (HOSPITAL_COMMUNITY): Payer: Self-pay | Admitting: *Deleted

## 2017-06-15 DIAGNOSIS — R000 Tachycardia, unspecified: Secondary | ICD-10-CM | POA: Insufficient documentation

## 2017-06-15 DIAGNOSIS — Z6841 Body Mass Index (BMI) 40.0 and over, adult: Secondary | ICD-10-CM | POA: Insufficient documentation

## 2017-06-15 DIAGNOSIS — R0602 Shortness of breath: Secondary | ICD-10-CM | POA: Diagnosis not present

## 2017-06-15 DIAGNOSIS — E119 Type 2 diabetes mellitus without complications: Secondary | ICD-10-CM | POA: Insufficient documentation

## 2017-06-15 DIAGNOSIS — I119 Hypertensive heart disease without heart failure: Secondary | ICD-10-CM | POA: Insufficient documentation

## 2017-06-15 NOTE — Progress Notes (Unsigned)
Attempted to do 2D echo with [facility] due difficult apical images.  Two unsuccessful IV attempts, please refer to Hospital for follow up if needed.    Deliah Boston, RDCS

## 2017-06-20 DIAGNOSIS — R000 Tachycardia, unspecified: Secondary | ICD-10-CM | POA: Diagnosis not present

## 2017-06-20 DIAGNOSIS — D6489 Other specified anemias: Secondary | ICD-10-CM | POA: Diagnosis not present

## 2017-06-20 DIAGNOSIS — R600 Localized edema: Secondary | ICD-10-CM | POA: Diagnosis not present

## 2017-06-20 DIAGNOSIS — E1121 Type 2 diabetes mellitus with diabetic nephropathy: Secondary | ICD-10-CM | POA: Diagnosis not present

## 2017-06-21 DIAGNOSIS — K760 Fatty (change of) liver, not elsewhere classified: Secondary | ICD-10-CM | POA: Insufficient documentation

## 2017-06-21 DIAGNOSIS — E1142 Type 2 diabetes mellitus with diabetic polyneuropathy: Secondary | ICD-10-CM | POA: Insufficient documentation

## 2017-06-21 DIAGNOSIS — Z8709 Personal history of other diseases of the respiratory system: Secondary | ICD-10-CM | POA: Insufficient documentation

## 2017-07-14 DIAGNOSIS — R600 Localized edema: Secondary | ICD-10-CM | POA: Diagnosis not present

## 2017-07-14 DIAGNOSIS — D6489 Other specified anemias: Secondary | ICD-10-CM | POA: Diagnosis not present

## 2017-07-14 DIAGNOSIS — G4733 Obstructive sleep apnea (adult) (pediatric): Secondary | ICD-10-CM | POA: Diagnosis not present

## 2017-07-14 DIAGNOSIS — E1165 Type 2 diabetes mellitus with hyperglycemia: Secondary | ICD-10-CM | POA: Diagnosis not present

## 2017-07-14 DIAGNOSIS — N182 Chronic kidney disease, stage 2 (mild): Secondary | ICD-10-CM | POA: Diagnosis not present

## 2017-07-16 ENCOUNTER — Other Ambulatory Visit: Payer: Self-pay

## 2017-07-16 ENCOUNTER — Emergency Department (HOSPITAL_COMMUNITY)
Admission: EM | Admit: 2017-07-16 | Discharge: 2017-07-16 | Disposition: A | Discharge status: Home / Self Care | Payer: BLUE CROSS/BLUE SHIELD | Attending: Emergency Medicine | Admitting: Emergency Medicine

## 2017-07-16 ENCOUNTER — Encounter (HOSPITAL_COMMUNITY): Payer: Self-pay

## 2017-07-16 ENCOUNTER — Emergency Department (HOSPITAL_COMMUNITY): Payer: BLUE CROSS/BLUE SHIELD

## 2017-07-16 DIAGNOSIS — R609 Edema, unspecified: Secondary | ICD-10-CM

## 2017-07-16 DIAGNOSIS — Z794 Long term (current) use of insulin: Secondary | ICD-10-CM | POA: Diagnosis not present

## 2017-07-16 DIAGNOSIS — R072 Precordial pain: Secondary | ICD-10-CM | POA: Insufficient documentation

## 2017-07-16 DIAGNOSIS — E1165 Type 2 diabetes mellitus with hyperglycemia: Secondary | ICD-10-CM | POA: Diagnosis not present

## 2017-07-16 DIAGNOSIS — R0602 Shortness of breath: Secondary | ICD-10-CM | POA: Diagnosis not present

## 2017-07-16 DIAGNOSIS — N183 Chronic kidney disease, stage 3 unspecified: Secondary | ICD-10-CM

## 2017-07-16 DIAGNOSIS — E1122 Type 2 diabetes mellitus with diabetic chronic kidney disease: Secondary | ICD-10-CM | POA: Diagnosis not present

## 2017-07-16 DIAGNOSIS — R079 Chest pain, unspecified: Secondary | ICD-10-CM | POA: Diagnosis not present

## 2017-07-16 DIAGNOSIS — I129 Hypertensive chronic kidney disease with stage 1 through stage 4 chronic kidney disease, or unspecified chronic kidney disease: Secondary | ICD-10-CM | POA: Insufficient documentation

## 2017-07-16 DIAGNOSIS — Z79899 Other long term (current) drug therapy: Secondary | ICD-10-CM | POA: Insufficient documentation

## 2017-07-16 DIAGNOSIS — R600 Localized edema: Secondary | ICD-10-CM | POA: Diagnosis not present

## 2017-07-16 DIAGNOSIS — R81 Glycosuria: Secondary | ICD-10-CM | POA: Insufficient documentation

## 2017-07-16 LAB — BASIC METABOLIC PANEL
ANION GAP: 9 (ref 5–15)
BUN: 17 mg/dL (ref 6–20)
CHLORIDE: 105 mmol/L (ref 101–111)
CO2: 21 mmol/L — AB (ref 22–32)
Calcium: 8.7 mg/dL — ABNORMAL LOW (ref 8.9–10.3)
Creatinine, Ser: 1.35 mg/dL — ABNORMAL HIGH (ref 0.44–1.00)
GFR calc non Af Amer: 49 mL/min — ABNORMAL LOW (ref >60)
GFR, EST AFRICAN AMERICAN: 57 mL/min — AB (ref >60)
Glucose, Bld: 407 mg/dL — ABNORMAL HIGH (ref 65–99)
Potassium: 4 mmol/L (ref 3.5–5.1)
Sodium: 135 mmol/L (ref 135–145)

## 2017-07-16 LAB — URINALYSIS, ROUTINE W REFLEX MICROSCOPIC
Bacteria, UA: NONE SEEN
Bilirubin Urine: NEGATIVE
KETONES UR: NEGATIVE mg/dL
LEUKOCYTES UA: NEGATIVE
NITRITE: NEGATIVE
PH: 5 (ref 5.0–8.0)
Protein, ur: 100 mg/dL — AB
RBC / HPF: NONE SEEN RBC/hpf (ref 0–5)
Specific Gravity, Urine: 1.022 (ref 1.005–1.030)

## 2017-07-16 LAB — CBG MONITORING, ED: Glucose-Capillary: 347 mg/dL — ABNORMAL HIGH (ref 65–99)

## 2017-07-16 LAB — CBC
HEMATOCRIT: 29.2 % — AB (ref 36.0–46.0)
HEMOGLOBIN: 9.6 g/dL — AB (ref 12.0–15.0)
MCH: 27 pg (ref 26.0–34.0)
MCHC: 32.9 g/dL (ref 30.0–36.0)
MCV: 82 fL (ref 78.0–100.0)
Platelets: 361 10*3/uL (ref 150–400)
RBC: 3.56 MIL/uL — AB (ref 3.87–5.11)
RDW: 14.1 % (ref 11.5–15.5)
WBC: 9.2 10*3/uL (ref 4.0–10.5)

## 2017-07-16 LAB — I-STAT BETA HCG BLOOD, ED (MC, WL, AP ONLY): I-stat hCG, quantitative: <5 m[IU]/mL (ref <5)

## 2017-07-16 LAB — I-STAT TROPONIN, ED: Troponin i, poc: 0 ng/mL (ref 0.00–0.08)

## 2017-07-16 MED ORDER — INSULIN ASPART 100 UNIT/ML ~~LOC~~ SOLN
12.0000 [IU] | Freq: Once | SUBCUTANEOUS | Status: DC
  Filled 2017-07-16: qty 1

## 2017-07-16 MED ORDER — INSULIN ASPART 100 UNIT/ML ~~LOC~~ SOLN
8.0000 [IU] | Freq: Once | SUBCUTANEOUS | Status: AC
  Administered 2017-07-16: 8 [IU] via INTRAVENOUS

## 2017-07-16 NOTE — ED Provider Notes (Signed)
[facility]ERGENC[facility] DEPARTMENT Provider Note   CSN: [id number] Arrival date & time: 07/16/17  1416     History   Chief Complaint Chief Complaint  Patient presents with  . Chest Pain    HPI Shakeeta Godette is a 36 y.o. female.  Lilian Ohman is a 36 y.o. Female who presents to the emergency department complaining of intermittent chest pain and shortness of breath for the past 5 days.  She also reports edema to her bilateral lower extremities.  She also complains of some pain with urination over the past several days.  Patient has a long history of having intermittent chest pain.  She is being followed by her primary care doctor and cardiologist.  She had a CT angiogram of her chest to rule out a PE last month.  No evidence of PE.  Possible pulmonary hypertension.  Later, an echocardiogram, by Dr. Percival Spanish showed normal ejection fraction and no evidence of pulmonary hypertension.  There is no explanation for the patient's shortness of breath on her echo.  She had a normal cardiac cath 1 year ago.  Patient tells me she is compliant with her medications including her insulin.  She tells me she last checked her blood sugar yesterday and it was 280.  She does not do sliding scale insulin.  She reports her leg swelling is unchanged after a trial of loop diuretic by her primary care doctor.  She denies of fevers, coughing, hemoptysis, abdominal pain, nausea, vomiting, diarrhea, syncope or rashes.   The history is provided by the patient and medical records. No language interpreter was used.  Chest Pain   Associated symptoms include shortness of breath. Pertinent negatives include no abdominal pain, no back pain, no cough, no fever, no headaches, no nausea, no palpitations and no vomiting.    Past Medical History:  Diagnosis Date  . Anemia   . Anxiety   . Chronic bronchitis ([place])   . Hypertension   . Increased frequency of headaches   . Morbid obesity ([place])   . Type II  diabetes mellitus [facility])     Patient Active Problem List   Diagnosis Date Noted  . Atrial fibrillation, permanent ([place]) 05/25/2017  . Sepsis ([place]) 05/03/2017    Class: Present on Admission  . AKI (acute kidney injury) ([place]) 05/03/2017    Class: Stage 3  . Neck pain   . Acidosis, metabolic   . Hyponatremia   . Type II diabetes mellitus ([place]) 04/30/2017  . Anemia 04/30/2017  . Staphylococcus aureus bacteremia 03/15/2016  . Streptococcal bacteremia 03/15/2016  . Tachycardia 03/14/2016  . Fever 03/14/2016  . Bad headache 03/14/2016  . Morbid obesity ([place])   . HTN (hypertension) 12/22/2015  . Diabetes ([place]) 01/01/2014    Past Surgical History:  Procedure Laterality Date  . BREAST REDUCTION SURGERY  03/21/2017  . CARDIAC CATHETERIZATION  01/06/2016  . CARDIAC CATHETERIZATION N/A 01/06/2016   Procedure: Left Heart Cath and Coronary Angiography;  Surgeon: Wellington Hampshire, MD;  Location: [name];  Service: Cardiovascular;  Laterality: N/A;  . CESAREAN SECTION  08/2014    OB History    Gravida Para Term Preterm AB Living   1             SAB TAB Ectopic Multiple Live Births                   Home Medications    Prior to Admission medications   Medication Sig Start Date  End Date Taking? Authorizing Provider  amLODipine (NORVASC) 5 MG tablet Take 1 tablet (5 mg total) daily by mouth. 05/25/17 08/23/17  Minus Breeding, MD  benzonatate (TESSALON) 100 MG capsule Take 1 capsule (100 mg total) every 8 (eight) hours by mouth. 05/23/17   Long, Wonda Olds, MD  insulin aspart (NOVOLOG) 100 UNIT/ML injection Inject 5 Units into the skin 3 (three) times daily with meals. 05/02/17   Donne Hazel, MD  Insulin Glargine (LANTUS) 100 UNIT/ML Solostar Pen Inject 20 Units into the skin daily at 10 pm. 05/02/17   Donne Hazel, MD  olmesartan (BENICAR) 5 MG tablet Take daily by mouth.    [provider]  ondansetron (ZOFRAN ODT) 4 MG disintegrating tablet Take 1 tablet (4 mg  total) every 8 (eight) hours as needed by mouth for nausea or vomiting. 05/23/17   Long, Wonda Olds, MD  propranolol (INDERAL) 20 MG tablet Take 1 tablet (20 mg total) by mouth 2 (two) times daily. 06/13/17   Sherwood Gambler, MD    Family History Family History  Problem Relation Age of Onset  . Diabetes Mother   . Hypertension Mother   . Thyroid disease Mother   . Kidney disease Maternal Grandmother   . Diabetes Maternal Grandmother   . Heart attack Unknown     Social History Social History   Tobacco Use  . Smoking status: Never Smoker  . Smokeless tobacco: Never Used  Substance Use Topics  . Alcohol use: No  . Drug use: No     Allergies   Patient has no known allergies.   Review of Systems Review of Systems  Constitutional: Negative for chills and fever.  HENT: Negative for congestion and sore throat.   Eyes: Negative for visual disturbance.  Respiratory: Positive for shortness of breath. Negative for cough and wheezing.   Cardiovascular: Positive for chest pain and leg swelling. Negative for palpitations.  Gastrointestinal: Negative for abdominal pain, diarrhea, nausea and vomiting.  Genitourinary: Positive for dysuria. Negative for decreased urine volume, difficulty urinating, flank pain, frequency, hematuria and urgency.  Musculoskeletal: Negative for back pain and neck pain.  Skin: Negative for rash.  Neurological: Negative for syncope, light-headedness and headaches.     Physical Exam Updated Vital Signs BP (!) 177/94   Pulse 99   Temp 99.4 F (37.4 C) (Oral)   Resp (!) 21   SpO2 100%   Physical Exam  Constitutional: She appears well-developed and well-nourished.  Non-toxic appearance. She does not appear ill. No distress.  Nontoxic appearing.  Morbidly obese.  HENT:  Head: Normocephalic and atraumatic.  Mouth/Throat: Oropharynx is clear and moist.  Eyes: Conjunctivae are normal. Pupils are equal, round, and reactive to light. Right eye exhibits no  discharge. Left eye exhibits no discharge.  Neck: Neck supple.  Cardiovascular: Normal rate, regular rhythm, normal heart sounds and intact distal pulses. Exam reveals no gallop and no friction rub.  No murmur heard. Pulses:      Radial pulses are 2+ on the right side, and 2+ on the left side.  Pulmonary/Chest: Effort normal and breath sounds normal. No respiratory distress. She has no wheezes. She has no rales.  Lungs are clear to ascultation bilaterally. Symmetric chest expansion bilaterally. No increased work of breathing. No rales or rhonchi.    Abdominal: Soft. There is no tenderness.  Musculoskeletal:       Right lower leg: She exhibits edema. She exhibits no tenderness.       Left lower leg:  She exhibits edema. She exhibits no tenderness.  Trace symmetric bilateral lower extremity edema.  No calf edema or tenderness.  Lymphadenopathy:    She has no cervical adenopathy.  Neurological: She is alert. Coordination normal.  Skin: Skin is warm and dry. Capillary refill takes less than 2 seconds. No rash noted. She is not diaphoretic. No erythema. No pallor.  Psychiatric: She has a normal mood and affect. Her behavior is normal.  Nursing note and vitals reviewed.    ED Treatments / Results  Labs (all labs ordered are listed, but only abnormal results are displayed) Labs Reviewed  BASIC METABOLIC PANEL - Abnormal; Notable for the following components:      Result Value   CO2 21 (*)    Glucose, Bld 407 (*)    Creatinine, Ser 1.35 (*)    Calcium 8.7 (*)    GFR calc non Af Amer 49 (*)    GFR calc Af Amer 57 (*)    All other components within normal limits  CBC - Abnormal; Notable for the following components:   RBC 3.56 (*)    Hemoglobin 9.6 (*)    HCT 29.2 (*)    All other components within normal limits  URINALYSIS, ROUTINE W REFLEX MICROSCOPIC - Abnormal; Notable for the following components:   APPearance HAZY (*)    Glucose, UA >=500 (*)    Hgb urine dipstick SMALL (*)     Protein, ur 100 (*)    Squamous Epithelial / LPF 6-30 (*)    All other components within normal limits  CBG MONITORING, ED - Abnormal; Notable for the following components:   Glucose-Capillary 347 (*)    All other components within normal limits  I-STAT TROPONIN, ED  I-STAT BETA HCG BLOOD, ED ([facility], [facility], [facility] ONLY)    EKG  EKG Interpretation  Date/Time:  Sunday July 16 2017 14:27:54 EST Ventricular Rate:  113 PR Interval:  152 QRS Duration: 76 QT Interval:  328 QTC Calculation: 449 R Axis:   83 Text Interpretation:  Sinus tachycardia No significant change since last tracing Confirmed by Lajean Saver [phone]) on 07/16/2017 4:24:07 PM       Radiology Dg Chest 2 View  Result Date: 07/16/2017 CLINICAL DATA:  Shortness of breath and mid chest pain for 3 days. EXAM: CHEST  2 VIEW COMPARISON:  June 13, 2017 FINDINGS: The mediastinal contour is normal. Heart size is mildly enlarged. Both lungs are clear. The visualized skeletal structures are unremarkable. IMPRESSION: No active cardiopulmonary disease.  Marked cardiomegaly. Electronically Signed   By: Abelardo Diesel M.D.   On: 07/16/2017 15:06    Procedures Procedures (including critical care time)  Medications Ordered in ED Medications  insulin aspart (novoLOG) injection 8 Units (8 Units Intravenous Given 07/16/17 1727)     Initial Impression / Assessment and Plan / ED Course  I have reviewed the triage vital signs and the nursing notes.  Pertinent labs & imaging results that were available during my care of the patient were reviewed by me and considered in my medical decision making (see chart for details).    This is a 36 y.o. Female who presents to the emergency department complaining of intermittent chest pain and shortness of breath for the past 5 days.  She also reports edema to her bilateral lower extremities.  She also complains of some pain with urination over the past several days.  Patient has a long history of  having intermittent chest pain.  She is being followed  by her primary care doctor and cardiologist.  She had a CT angiogram of her chest to rule out a PE last month.  No evidence of PE.  Possible pulmonary hypertension.  Later, an echocardiogram, by Dr. Percival Spanish showed normal ejection fraction and no evidence of pulmonary hypertension.  There is no explanation for the patient's shortness of breath on her echo.  She had a normal cardiac cath 1 year ago.  Patient tells me she is compliant with her medications including her insulin.  She tells me she last checked her blood sugar yesterday and it was 280.  She does not do sliding scale insulin. On exam the patient is afebrile nontoxic-appearing.  She is morbidly obese.  On arrival to the emergency department she is mildly tachycardic with a heart rate around 111.  On my exam her heart rate is around.  Patient chronically has sinus tachycardia based on history.  Previous workups are reassuring.  Cardiology suspects a large component of her shortness of breath is related to deconditioning.  EKG shows sinus tachycardia.  Troponin is not elevated.  Low suspicion for ACS with normal cath and recent workup.  BMP is remarkable for a glucose of 407 with normal anion gap.  Patient has poorly controlled diabetes.  She has insulin and glucometer at home.  I encouraged her to use this and to work on her diet.  She needs close follow-up by primary care to work on further management of her diabetes.  She also has trace lower extremity edema.  This is also likely related to multiple factors.  No evidence of heart failure on recent echo.  She is not hypoxic or tachypneic.  She ambulates in the emergency department without difficulty or distress.  Chest x-ray shows continued cardiomegaly.  Hemoglobin is around her baseline.  Urinalysis is negative for infection.  Glucosuria present. Patient has multiple ongoing factors that are contributing to her symptoms such as her diabetes that is  uncontrolled her poorly controlled hypertension and poorly controlled diet and exercise.  No concern for ACS at this time.  She had recent CT angiogram of her chest without evidence of PE.  Not suspicious for PE in this patient.  Patient needs close follow-up with primary care and dietitian.  I discussed diet and exercise with the patient.  Return precautions discussed. I advised the patient to follow-up with their primary care provider this week. I advised the patient to return to the emergency department with new or worsening symptoms or new concerns. The patient verbalized understanding and agreement with plan.   This patient was discussed with and evaluated by Dr. Ashok Cordia who agrees with assessment and plan.   Final Clinical Impressions(s) / ED Diagnoses   Final diagnoses:  Precordial pain  Peripheral edema  CKD (chronic kidney disease) stage 3, GFR 30-59 ml/min (HCC)  Type 2 diabetes mellitus with hyperglycemia, with long-term current use of insulin [facility])  Glucosuria    ED Discharge Orders    None       Waynetta Pean, PA-C 07/16/17 1826    Lajean Saver, MD 07/16/17 1827

## 2017-07-16 NOTE — ED Notes (Signed)
Pt given water per Ruth(RN)

## 2017-07-16 NOTE — ED Notes (Signed)
Pt CBG was 347, notified Ruth(RN)

## 2017-07-16 NOTE — ED Triage Notes (Signed)
Pt reports chest pain, sob, and peripheral edema x 4-5 days. Pt reports she saw PCP for swelling when it started and was given a medication to get off the fluid. After taking the medication she began having the chest pain and sob. Swelling is no better at this time.

## 2017-07-19 ENCOUNTER — Other Ambulatory Visit (HOSPITAL_BASED_OUTPATIENT_CLINIC_OR_DEPARTMENT_OTHER): Payer: Self-pay

## 2017-07-19 DIAGNOSIS — G4730 Sleep apnea, unspecified: Secondary | ICD-10-CM

## 2017-07-19 DIAGNOSIS — R5383 Other fatigue: Secondary | ICD-10-CM

## 2017-07-19 DIAGNOSIS — G4710 Hypersomnia, unspecified: Secondary | ICD-10-CM

## 2017-07-19 DIAGNOSIS — R0683 Snoring: Secondary | ICD-10-CM

## 2017-07-25 ENCOUNTER — Ambulatory Visit: Payer: BLUE CROSS/BLUE SHIELD | Admitting: Skilled Nursing Facility1

## 2017-07-26 ENCOUNTER — Ambulatory Visit: Payer: BLUE CROSS/BLUE SHIELD | Admitting: Skilled Nursing Facility1

## 2017-08-02 NOTE — Progress Notes (Deleted)
Cardiology Office Note   Date:  08/02/2017   ID:  [name], DOB June 30, 1981, MRN [id number]  PCP:  Audley Hose, MD  Cardiologist:   Minus Breeding, MD    No chief complaint on file.     History of Present Illness: Jacqueline [name] is a 37 y.o. female who presents for evaluation of SOB.  She was seen in the ED prior to the last visit.  She has sporadic chest pain that is sharp.  She has some chronic shortness of breath.   I sent her for an echo which was unremarkable.  ***     This happens when she walks on level ground.  She might have this also while seated.  She sleep slightly propped up.  I do note that her BNP level last year was okay.  There was an echo last year with NL EF and some evidence of LV diastolic dysfunction.   This was during a hospitalization wth chest pain.  Cardiac cath at that time was normal.     She was in the emergency room recently with chest discomfort and shortness of breath.  She had a CT that demonstrated no significant abnormalities.  There was no pulmonary emboli on this and no DVT on Doppler.  There was a suggestion of some elevated pulmonary pressure.   Past Medical History:  Diagnosis Date  . Anemia   . Anxiety   . Chronic bronchitis ([place])   . Hypertension   . Increased frequency of headaches   . Morbid obesity ([place])   . Type II diabetes mellitus ([place])     Past Surgical History:  Procedure Laterality Date  . BREAST REDUCTION SURGERY  03/21/2017  . CARDIAC CATHETERIZATION  01/06/2016  . CARDIAC CATHETERIZATION N/A 01/06/2016   Procedure: Left Heart Cath and Coronary Angiography;  Surgeon: Wellington [facility], MD;  Location: [facility];  Service: Cardiovascular;  Laterality: N/A;  . CESAREAN SECTION  08/2014     Current Outpatient Medications  Medication Sig Dispense Refill  . amLODipine (NORVASC) 5 MG tablet Take 1 tablet (5 mg total) daily by mouth. 90 tablet 3  . benzonatate (TESSALON) 100 MG capsule Take 1 capsule (100 mg  total) every 8 (eight) hours by mouth. 21 capsule 0  . insulin aspart (NOVOLOG) 100 UNIT/ML injection Inject 5 Units into the skin 3 (three) times daily with meals. 10 mL 0  . Insulin Glargine (LANTUS) 100 UNIT/ML Solostar Pen Inject 20 Units into the skin daily at 10 pm. 15 mL 0  . olmesartan (BENICAR) 5 MG tablet Take daily by mouth.    . ondansetron (ZOFRAN ODT) 4 MG disintegrating tablet Take 1 tablet (4 mg total) every 8 (eight) hours as needed by mouth for nausea or vomiting. 20 tablet 0  . propranolol (INDERAL) 20 MG tablet Take 1 tablet (20 mg total) by mouth 2 (two) times daily. 60 tablet 0   No current facility-administered medications for this visit.     Allergies:   Patient has no known allergies.     ROS:  Please see the history of present illness.   Otherwise, review of systems are positive for ***.   All other systems are reviewed and negative.    PHYSICAL EXAM: VS:  There were no vitals taken for this visit. , BMI There is no height or weight on file to calculate BMI.  GENERAL:  Well appearing NECK:  No jugular venous distention, waveform within normal limits, carotid upstroke  brisk and symmetric, no bruits, no thyromegaly LUNGS:  Clear to auscultation bilaterally CHEST:  Unremarkable HEART:  PMI not displaced or sustained,S1 and S2 within normal limits, no S3, no S4, no clicks, no rubs, *** murmurs ABD:  Flat, positive bowel sounds normal in frequency in pitch, no bruits, no rebound, no guarding, no midline pulsatile mass, no hepatomegaly, no splenomegaly EXT:  2 plus pulses throughout, no edema, no cyanosis no clubbing    GENERAL:  Well appearing HEENT:  Pupils equal round and reactive, fundi not visualized, oral mucosa unremarkable NECK:  No jugular venous distention, waveform within normal limits, carotid upstroke brisk and symmetric, no bruits, no thyromegaly LYMPHATICS:  No cervical, inguinal adenopathy LUNGS:  Clear to auscultation bilaterally BACK:  No CVA  tenderness CHEST:  Unremarkable HEART:  PMI not displaced or sustained,S1 and S2 within normal limits, no S3, no S4, no clicks, no rubs, no murmurs ABD:  Flat, positive bowel sounds normal in frequency in pitch, no bruits, no rebound, no guarding, no midline pulsatile mass, no hepatomegaly, no splenomegaly EXT:  2 plus pulses throughout, no edema, no cyanosis no clubbing SKIN:  No rashes no nodules NEURO:  Cranial nerves II through XII grossly intact, motor grossly intact throughout PSYCH:  Cognitively intact, oriented to person place and time    EKG:  EKG is ordered today. The ekg ordered today demonstrates sinus tachycardia, rate ***, axis within normal limits, intervals within normal limits, no acute changes.   Recent Labs: 05/23/2017: B Natriuretic Peptide 32.8 06/13/2017: ALT 15 07/16/2017: BUN 17; Creatinine, Ser 1.35; Hemoglobin 9.6; Platelets 361; Potassium 4.0; Sodium 135    Lipid Panel    Component Value Date/Time   CHOL 135 12/23/2015 0357   TRIG 202 (H) 12/23/2015 0357   HDL 31 (L) 12/23/2015 0357   CHOLHDL 4.4 12/23/2015 0357   VLDL 40 12/23/2015 0357   LDLCALC 64 12/23/2015 0357     Lab Results  Component Value Date   HGBA1C 10.8 (H) 05/01/2017    Wt Readings from Last 3 Encounters:  06/13/17 288 lb (130.6 kg)  05/25/17 288 lb (130.6 kg)  05/23/17 285 lb (129.3 kg)      Other studies Reviewed: Additional studies/ records that were reviewed today include: Hospital records,   *** Review of the above records demonstrates:  ***     ASSESSMENT AND PLAN:   Chest pain:   She had normal cath in 2017.  *** last year.  No further cardiovascular testing is indicated.    HTN:  ***  This is not well controlled.  I am going to add Norvasc 5 mg daily.  She is going to get a blood pressure cuff and keep a diary.    SOB:   She had an unremarkable echo last year. *** There was a suggestion of pulmonary hypertension or elevated pressures noted on the CT.  This was  not noted last year on the echo.  I will repeat an echocardiogram.  I suspect that this is multifactorial related to weight and deconditioning.  There may be some element of diastolic dysfunction.  DM:  ***  We talked at length about diet and exercise.  Her hemoglobin A1c is as above.  We discussed target.  She is establishing with a new primary care and understands the importance of this.    Morbid obesity:   ***  We had a very long and specific instruction about diet and.    CKD II:   ***  She  will follow this with her primary care and will work on risk reduction.  ANEMIA:  ***  This is noted in the hospital recently and she is to follow-up with her primary provider.  Current medicines are reviewed at length with the patient today.  The patient does not have concerns regarding medicines.  The following changes have been made:  ***  Labs/ tests ordered today include: ***   No orders of the defined types were placed in this encounter.    Disposition:   FU with me in *** months.     Signed, Minus Breeding, MD  08/02/2017 8:12 PM    [facility]

## 2017-08-03 ENCOUNTER — Ambulatory Visit (HOSPITAL_BASED_OUTPATIENT_CLINIC_OR_DEPARTMENT_OTHER): Payer: BLUE CROSS/BLUE SHIELD | Attending: Internal Medicine | Admitting: Internal Medicine

## 2017-08-03 VITALS — Ht 65.0 in | Wt 280.0 lb

## 2017-08-03 DIAGNOSIS — G4733 Obstructive sleep apnea (adult) (pediatric): Secondary | ICD-10-CM

## 2017-08-03 DIAGNOSIS — R5383 Other fatigue: Secondary | ICD-10-CM | POA: Diagnosis not present

## 2017-08-03 DIAGNOSIS — R0683 Snoring: Secondary | ICD-10-CM | POA: Diagnosis not present

## 2017-08-03 DIAGNOSIS — G4730 Sleep apnea, unspecified: Secondary | ICD-10-CM

## 2017-08-03 DIAGNOSIS — G4710 Hypersomnia, unspecified: Secondary | ICD-10-CM

## 2017-08-03 DIAGNOSIS — R400 Somnolence: Secondary | ICD-10-CM | POA: Diagnosis not present

## 2017-08-04 ENCOUNTER — Encounter: Payer: Self-pay | Admitting: *Deleted

## 2017-08-04 ENCOUNTER — Ambulatory Visit: Payer: BLUE CROSS/BLUE SHIELD | Admitting: Cardiology

## 2017-08-06 DIAGNOSIS — R0683 Snoring: Secondary | ICD-10-CM | POA: Diagnosis not present

## 2017-08-06 NOTE — Procedures (Signed)
    Patient Name: Jacqueline [name], Jacqueline [name] Date: 08/03/2017 Gender: Female D.O.B: [birth year] Age (years): 36 Referring Provider: Latanya Presser MD Height (inches): 65 Interpreting Physician: Baird Lyons MD, ABSM Weight (lbs): 280 RPSGT: Lanae Boast BMI: 47 MRN: [id number] Neck Size: 15.50 <br> <br> CLINICAL INFORMATION Sleep Study Type: NPSG  Indication for sleep study: Daytime Fatigue, Snoring, Witnesses Apnea / Gasping During Sleep  Epworth Sleepiness Score: 14  SLEEP STUDY TECHNIQUE As per the AASM Manual for the Scoring of Sleep and Associated Events v2.3 (April 2016) with a hypopnea requiring 4% desaturations.  The channels recorded and monitored were frontal, central and occipital EEG, electrooculogram (EOG), submentalis EMG (chin), nasal and oral airflow, thoracic and abdominal wall motion, anterior tibialis EMG, snore microphone, electrocardiogram, and pulse oximetry.  MEDICATIONS Medications self-administered by patient taken the night of the study : none reported  SLEEP ARCHITECTURE The study was initiated at 9:21:49 PM and ended at 5:01:59 AM.  Sleep onset time was 17.9 minutes and the sleep efficiency was 91.3%. The total sleep time was 420.0 minutes.  Stage REM latency was 142.0 minutes.  The patient spent 2.62% of the night in stage N1 sleep, 78.21% in stage N2 sleep, 0.00% in stage N3 and 19.17% in REM.  Alpha intrusion was absent.  Supine sleep was 85.48%.  RESPIRATORY PARAMETERS The overall apnea/hypopnea index (AHI) was 18.7 per hour. There were 84 total apneas, including 73 obstructive, 11 central and 0 mixed apneas. There were 47 hypopneas and 2 RERAs.  The AHI during Stage REM sleep was 76.0 per hour.  AHI while supine was 18.2 per hour.  The mean oxygen saturation was 95.63%. The minimum SpO2 during sleep was 75.00%.  loud snoring was noted during this study.  CARDIAC DATA The 2 lead EKG demonstrated sinus rhythm. The mean heart  rate was 97.20 beats per minute. Other EKG findings include: None.  LEG MOVEMENT DATA The total PLMS were 0 with a resulting PLMS index of 0.00. Associated arousal with leg movement index was 0.0 .  IMPRESSIONS - Moderate obstructive sleep apnea occurred during this study (AHI = 18.7/h). - No significant central sleep apnea occurred during this study (CAI = 1.6/h). - Moderate oxygen desaturation was noted during this study (Min O2 = 75.00%, Mean 95.6%). - The patient snored with loud snoring volume. - No cardiac abnormalities were noted during this study. - Clinically significant periodic limb movements did not occur during sleep. No significant associated arousals.  DIAGNOSIS - Obstructive Sleep Apnea (327.23 [G47.33 ICD-10])  RECOMMENDATIONS - Recommend CPAP titration or AutoPAP. Other options would be based on clinical judgment. - Be careful with alcohol, sedatives and other CNS depressants that may worsen sleep apnea and disrupt normal sleep architecture. - Sleep hygiene should be reviewed to assess factors that may improve sleep quality. - Weight management and regular exercise should be initiated or continued if appropriate.  [Electronically signed] 08/06/2017 02:11 PM  Baird Lyons MD, [place], American Board of Sleep Medicine   NPI: [id number]                         [place], [place] [facility]  ELECTRONICALLY SIGNED ON:  08/06/2017, 2:10 PM [place] PH: [phone]   FX: [phone] [place]

## 2017-08-07 DIAGNOSIS — Z6841 Body Mass Index (BMI) 40.0 and over, adult: Secondary | ICD-10-CM | POA: Diagnosis not present

## 2017-08-07 DIAGNOSIS — E1165 Type 2 diabetes mellitus with hyperglycemia: Secondary | ICD-10-CM | POA: Diagnosis not present

## 2017-08-07 DIAGNOSIS — F321 Major depressive disorder, single episode, moderate: Secondary | ICD-10-CM | POA: Diagnosis not present

## 2017-08-07 DIAGNOSIS — F410 Panic disorder [episodic paroxysmal anxiety] without agoraphobia: Secondary | ICD-10-CM | POA: Diagnosis not present

## 2017-08-07 DIAGNOSIS — F411 Generalized anxiety disorder: Secondary | ICD-10-CM | POA: Insufficient documentation

## 2017-08-10 ENCOUNTER — Other Ambulatory Visit (HOSPITAL_BASED_OUTPATIENT_CLINIC_OR_DEPARTMENT_OTHER): Payer: Self-pay

## 2017-08-10 DIAGNOSIS — G4730 Sleep apnea, unspecified: Secondary | ICD-10-CM

## 2017-08-10 DIAGNOSIS — G4710 Hypersomnia, unspecified: Secondary | ICD-10-CM

## 2017-08-10 DIAGNOSIS — R0683 Snoring: Secondary | ICD-10-CM

## 2017-08-14 DIAGNOSIS — F329 Major depressive disorder, single episode, unspecified: Secondary | ICD-10-CM | POA: Diagnosis not present

## 2017-08-14 DIAGNOSIS — Z6841 Body Mass Index (BMI) 40.0 and over, adult: Secondary | ICD-10-CM | POA: Diagnosis not present

## 2017-08-27 ENCOUNTER — Ambulatory Visit (HOSPITAL_BASED_OUTPATIENT_CLINIC_OR_DEPARTMENT_OTHER): Payer: BLUE CROSS/BLUE SHIELD | Attending: Internal Medicine | Admitting: Internal Medicine

## 2017-08-27 VITALS — Ht 65.0 in | Wt 288.0 lb

## 2017-08-27 DIAGNOSIS — G4710 Hypersomnia, unspecified: Secondary | ICD-10-CM

## 2017-08-27 DIAGNOSIS — G4733 Obstructive sleep apnea (adult) (pediatric): Secondary | ICD-10-CM | POA: Insufficient documentation

## 2017-08-27 DIAGNOSIS — R0683 Snoring: Secondary | ICD-10-CM

## 2017-08-27 DIAGNOSIS — G4730 Sleep apnea, unspecified: Secondary | ICD-10-CM

## 2017-08-28 DIAGNOSIS — R070 Pain in throat: Secondary | ICD-10-CM | POA: Diagnosis not present

## 2017-08-28 DIAGNOSIS — I10 Essential (primary) hypertension: Secondary | ICD-10-CM | POA: Diagnosis not present

## 2017-08-28 DIAGNOSIS — R6889 Other general symptoms and signs: Secondary | ICD-10-CM | POA: Diagnosis not present

## 2017-09-02 DIAGNOSIS — G4733 Obstructive sleep apnea (adult) (pediatric): Secondary | ICD-10-CM | POA: Diagnosis not present

## 2017-09-02 NOTE — Procedures (Signed)
Patient Name: Jacqueline [name], Jacqueline [name] Date: 08/27/2017 Gender: Female D.O.B: 1981-05-14 Age (years): 36 Referring Provider: Latanya Presser MD Height (inches): 65 Interpreting Physician: Baird Lyons MD, ABSM Weight (lbs): 288 RPSGT: Baxter Flattery BMI: 48 MRN: [id number] Neck Size: 15.50 <br> <br> CLINICAL INFORMATION The patient is referred for a CPAP titration to treat sleep apnea.  Date of NPSG, Split Night or HST: 08/03/17  NPSG  AHI 18.7/ hr, desaturation to 75%, body weight 280 lbs  SLEEP STUDY TECHNIQUE As per the AASM Manual for the Scoring of Sleep and Associated Events v2.3 (April 2016) with a hypopnea requiring 4% desaturations.  The channels recorded and monitored were frontal, central and occipital EEG, electrooculogram (EOG), submentalis EMG (chin), nasal and oral airflow, thoracic and abdominal wall motion, anterior tibialis EMG, snore microphone, electrocardiogram, and pulse oximetry. Continuous positive airway pressure (CPAP) was initiated at the beginning of the study and titrated to treat sleep-disordered breathing.  MEDICATIONS Medications self-administered by patient taken the night of the study : none reported  TECHNICIAN COMMENTS Comments added by technician: [place] Comments added by scorer: N/A  RESPIRATORY PARAMETERS Optimal PAP Pressure (cm): 15 AHI at Optimal Pressure (/hr): 12.8 Overall Minimal O2 (%): 69.00 Supine % at Optimal Pressure (%): 0 Minimal O2 at Optimal Pressure (%): 91.0   SLEEP ARCHITECTURE The study was initiated at 10:42:44 PM and ended at 4:45:54 AM.  Sleep onset time was 12.0 minutes and the sleep efficiency was 92.1%. The total sleep time was 334.6 minutes.  The patient spent 4.52% of the night in stage N1 sleep, 78.30% in stage N2 sleep, 0.15% in stage N3 and 17.03% in REM.Stage REM latency was 162.5 minutes  Wake after sleep onset was 16.5. Alpha intrusion was absent. Supine sleep was  11.12%.  CARDIAC DATA The 2 lead EKG demonstrated sinus rhythm. The mean heart rate was 101.36 beats per minute. Other EKG findings include: None.  LEG MOVEMENT DATA The total Periodic Limb Movements of Sleep (PLMS) were 0. The PLMS index was 0.00. A PLMS index of <15 is considered normal in adults.  IMPRESSIONS - The optimal PAP pressure was 15 cm of water. - Central sleep apnea was not noted during this titration (CAI = 0.0/h). - Severe oxygen desaturations were observed during this titration (min O2 = 69.00%). Minimal saturation at CPAP 15 = 91%. - No snoring was audible during this study. - No cardiac abnormalities were observed during this study. - Clinically significant periodic limb movements were not noted during this study. Arousals associated with PLMs were rare.  DIAGNOSIS - Obstructive Sleep Apnea (327.23 [G47.33 ICD-10])  RECOMMENDATIONS - Trial of CPAP therapy on 15 cm H2O. Patient used a Medium size Resmed Full Face Mask AirFit F20 mask and heated humidification. - Be careful with alcohol, sedatives and other CNS depressants that may worsen sleep apnea and disrupt normal sleep architecture. - Sleep hygiene should be reviewed to assess factors that may improve sleep quality. - Weight management and regular exercise should be initiated or continued.  [Electronically signed] 09/02/2017 03:26 PM  Baird Lyons MD, [place], American Board of Sleep Medicine   NPI: [id number]                         [place], [place] [facility]  ELECTRONICALLY SIGNED ON:  09/02/2017, 3:24 PM [place] PH: [phone]   FX: [phone] [place]  OF SLEEP [facility]IC[facility]

## 2017-09-18 DIAGNOSIS — E1165 Type 2 diabetes mellitus with hyperglycemia: Secondary | ICD-10-CM | POA: Diagnosis not present

## 2017-09-18 DIAGNOSIS — F411 Generalized anxiety disorder: Secondary | ICD-10-CM | POA: Diagnosis not present

## 2017-09-18 DIAGNOSIS — F410 Panic disorder [episodic paroxysmal anxiety] without agoraphobia: Secondary | ICD-10-CM | POA: Diagnosis not present

## 2017-09-18 DIAGNOSIS — F321 Major depressive disorder, single episode, moderate: Secondary | ICD-10-CM | POA: Diagnosis not present

## 2017-10-01 ENCOUNTER — Emergency Department (HOSPITAL_BASED_OUTPATIENT_CLINIC_OR_DEPARTMENT_OTHER)
Admission: EM | Admit: 2017-10-01 | Discharge: 2017-10-02 | Disposition: A | Discharge status: Home / Self Care | Payer: BLUE CROSS/BLUE SHIELD | Attending: Emergency Medicine | Admitting: Emergency Medicine

## 2017-10-01 ENCOUNTER — Emergency Department (HOSPITAL_BASED_OUTPATIENT_CLINIC_OR_DEPARTMENT_OTHER): Payer: BLUE CROSS/BLUE SHIELD

## 2017-10-01 ENCOUNTER — Other Ambulatory Visit: Payer: Self-pay

## 2017-10-01 ENCOUNTER — Encounter (HOSPITAL_BASED_OUTPATIENT_CLINIC_OR_DEPARTMENT_OTHER): Payer: Self-pay | Admitting: *Deleted

## 2017-10-01 DIAGNOSIS — Z79899 Other long term (current) drug therapy: Secondary | ICD-10-CM | POA: Insufficient documentation

## 2017-10-01 DIAGNOSIS — F419 Anxiety disorder, unspecified: Secondary | ICD-10-CM | POA: Insufficient documentation

## 2017-10-01 DIAGNOSIS — R2243 Localized swelling, mass and lump, lower limb, bilateral: Secondary | ICD-10-CM | POA: Diagnosis not present

## 2017-10-01 DIAGNOSIS — R7989 Other specified abnormal findings of blood chemistry: Secondary | ICD-10-CM

## 2017-10-01 DIAGNOSIS — R000 Tachycardia, unspecified: Secondary | ICD-10-CM | POA: Insufficient documentation

## 2017-10-01 DIAGNOSIS — E119 Type 2 diabetes mellitus without complications: Secondary | ICD-10-CM | POA: Insufficient documentation

## 2017-10-01 DIAGNOSIS — R791 Abnormal coagulation profile: Secondary | ICD-10-CM | POA: Diagnosis not present

## 2017-10-01 DIAGNOSIS — R0602 Shortness of breath: Secondary | ICD-10-CM | POA: Diagnosis not present

## 2017-10-01 DIAGNOSIS — M7989 Other specified soft tissue disorders: Secondary | ICD-10-CM

## 2017-10-01 DIAGNOSIS — I10 Essential (primary) hypertension: Secondary | ICD-10-CM | POA: Insufficient documentation

## 2017-10-01 DIAGNOSIS — Z794 Long term (current) use of insulin: Secondary | ICD-10-CM | POA: Insufficient documentation

## 2017-10-01 DIAGNOSIS — R600 Localized edema: Secondary | ICD-10-CM | POA: Diagnosis not present

## 2017-10-01 HISTORY — DX: Sleep apnea, unspecified: G47.30

## 2017-10-01 LAB — COMPREHENSIVE METABOLIC PANEL
ALK PHOS: 65 U/L (ref 38–126)
ALT: 14 U/L (ref 14–54)
AST: 16 U/L (ref 15–41)
Albumin: 2.5 g/dL — ABNORMAL LOW (ref 3.5–5.0)
Anion gap: 7 (ref 5–15)
BUN: 21 mg/dL — AB (ref 6–20)
CALCIUM: 7.9 mg/dL — AB (ref 8.9–10.3)
CO2: 21 mmol/L — AB (ref 22–32)
CREATININE: 1.33 mg/dL — AB (ref 0.44–1.00)
Chloride: 105 mmol/L (ref 101–111)
GFR calc non Af Amer: 50 mL/min — ABNORMAL LOW (ref >60)
GFR, EST AFRICAN AMERICAN: 58 mL/min — AB (ref >60)
GLUCOSE: 316 mg/dL — AB (ref 65–99)
Potassium: 4.3 mmol/L (ref 3.5–5.1)
SODIUM: 133 mmol/L — AB (ref 135–145)
Total Bilirubin: 0.4 mg/dL (ref 0.3–1.2)
Total Protein: 6 g/dL — ABNORMAL LOW (ref 6.5–8.1)

## 2017-10-01 LAB — CBC WITH DIFFERENTIAL/PLATELET
BASOS PCT: 0 %
Basophils Absolute: 0 10*3/uL (ref 0.0–0.1)
Eosinophils Absolute: 0.3 10*3/uL (ref 0.0–0.7)
Eosinophils Relative: 3 %
HEMATOCRIT: 28 % — AB (ref 36.0–46.0)
Hemoglobin: 9.4 g/dL — ABNORMAL LOW (ref 12.0–15.0)
Lymphocytes Relative: 31 %
Lymphs Abs: 2.9 10*3/uL (ref 0.7–4.0)
MCH: 27.7 pg (ref 26.0–34.0)
MCHC: 33.6 g/dL (ref 30.0–36.0)
MCV: 82.6 fL (ref 78.0–100.0)
MONO ABS: 0.6 10*3/uL (ref 0.1–1.0)
MONOS PCT: 6 %
NEUTROS ABS: 5.6 10*3/uL (ref 1.7–7.7)
Neutrophils Relative %: 60 %
Platelets: 319 10*3/uL (ref 150–400)
RBC: 3.39 MIL/uL — ABNORMAL LOW (ref 3.87–5.11)
RDW: 13.1 % (ref 11.5–15.5)
WBC: 9.4 10*3/uL (ref 4.0–10.5)

## 2017-10-01 LAB — URINALYSIS, MICROSCOPIC (REFLEX)

## 2017-10-01 LAB — PREGNANCY, URINE: Preg Test, Ur: NEGATIVE

## 2017-10-01 LAB — URINALYSIS, ROUTINE W REFLEX MICROSCOPIC
BILIRUBIN URINE: NEGATIVE
KETONES UR: NEGATIVE mg/dL
Leukocytes, UA: NEGATIVE
NITRITE: NEGATIVE
PH: 6 (ref 5.0–8.0)
Protein, ur: >300 mg/dL — AB
Specific Gravity, Urine: 1.025 (ref 1.005–1.030)

## 2017-10-01 LAB — CBG MONITORING, ED: GLUCOSE-CAPILLARY: 307 mg/dL — AB (ref 65–99)

## 2017-10-01 LAB — BRAIN NATRIURETIC PEPTIDE: B Natriuretic Peptide: 43.7 pg/mL (ref 0.0–100.0)

## 2017-10-01 LAB — TROPONIN I: Troponin I: <0.03 ng/mL (ref <0.03)

## 2017-10-01 LAB — D-DIMER, QUANTITATIVE: D-Dimer, Quant: 0.64 ug/mL-FEU — ABNORMAL HIGH (ref 0.00–0.50)

## 2017-10-01 MED ORDER — IOPAMIDOL (ISOVUE-370) INJECTION 76%
100.0000 mL | Freq: Once | INTRAVENOUS | Status: AC | PRN
  Administered 2017-10-01: 100 mL via INTRAVENOUS

## 2017-10-01 MED ORDER — SODIUM CHLORIDE 0.9 % IV BOLUS (SEPSIS)
500.0000 mL | Freq: Once | INTRAVENOUS | Status: AC
  Administered 2017-10-01: 500 mL via INTRAVENOUS

## 2017-10-01 NOTE — ED Triage Notes (Signed)
Pt reports leg swelling, pain with walking, nausea, and fever 102. Sx ongoing x 5 days

## 2017-10-01 NOTE — ED Notes (Signed)
Delay in EKG due to pt in xray 

## 2017-10-01 NOTE — Discharge Instructions (Signed)
Today your blood sugar and blood pressure were high.  Please follow up with your primary care doctor.  Your chest CT didn't show any blood clots in your lungs.  Please follow up also as I suspect you need more diuretic medications to help with your fluid.  Please consider wearing thigh high compression socks to help with your pain.  Please elevate your legs to help with the swelling.

## 2017-10-01 NOTE — ED Provider Notes (Signed)
[facility] Provider Note   CSN: [id number] Arrival date & time: 10/01/17  1816     History   Chief Complaint Chief Complaint  Patient presents with  . Leg Swelling  . Fever    HPI Jacqueline [name] is a 37 y.o. female with a history of morbid obesity, HTN, DM 2, A. Fib, cardiac cath in 2017, who presents today for evaluation of generally not feeling well.  She reports that for the past week she has noticed that her legs are swelling and painful.  Legs are equally swollen bilaterally and she reports that her hands are also swollen.  She reports that she has also had fevers, up to 102 at home in the past 3 days.  She reports generally not feeling well.  Occasional nausea, no vomiting.  Her leg pain is made worse with walking.  She reports compliance with her Bumex, diabetes, and hypertension medications.  She denies missing any doses recently of any of her medicines.  No abdominal pain or chest pain.  She reports that she checks her blood sugar approximately 3 times a day and normally runs between 120 and 200.  HPI  Past Medical History:  Diagnosis Date  . Anemia   . Anxiety   . Chronic bronchitis ([place])   . Hypertension   . Increased frequency of headaches   . Morbid obesity ([place])   . Sleep apnea   . Type II diabetes mellitus [facility])     Patient Active Problem List   Diagnosis Date Noted  . Atrial fibrillation, permanent ([place]) 05/25/2017  . Sepsis ([place]) 05/03/2017    Class: Present on Admission  . AKI (acute kidney injury) ([place]) 05/03/2017    Class: Stage 3  . Neck pain   . Acidosis, metabolic   . Hyponatremia   . Type II diabetes mellitus ([place]) 04/30/2017  . Anemia 04/30/2017  . Staphylococcus aureus bacteremia 03/15/2016  . Streptococcal bacteremia 03/15/2016  . Tachycardia 03/14/2016  . Fever 03/14/2016  . Bad headache 03/14/2016  . Morbid obesity ([place])   . HTN (hypertension) 12/22/2015  . Diabetes ([place]) 01/01/2014    Past Surgical  History:  Procedure Laterality Date  . BREAST REDUCTION SURGERY  03/21/2017  . CARDIAC CATHETERIZATION  01/06/2016  . CARDIAC CATHETERIZATION N/A 01/06/2016   Procedure: Left Heart Cath and Coronary Angiography;  Surgeon: Wellington Hampshire, MD;  Location: [facility];  Service: Cardiovascular;  Laterality: N/A;  . CESAREAN SECTION  08/2014    OB History    Gravida Para Term Preterm AB Living   1             SAB TAB Ectopic Multiple Live Births                   Home Medications    Prior to Admission medications   Medication Sig Start Date End Date Taking? Authorizing Provider  bumetanide (BUMEX) 1 MG tablet Take 1 mg by mouth daily.   Yes [provider]  insulin aspart (NOVOLOG) 100 UNIT/ML injection Inject 5 Units into the skin 3 (three) times daily with meals. 05/02/17  Yes Donne Hazel, MD  Insulin Glargine (LANTUS) 100 UNIT/ML Solostar Pen Inject 20 Units into the skin daily at 10 pm. 05/02/17  Yes Donne Hazel, MD  metFORMIN (GLUCOPHAGE) 500 MG tablet Take by mouth 2 (two) times daily with a meal.   Yes [provider]  olmesartan (BENICAR) 5 MG tablet Take daily by  mouth.   Yes [provider]  propranolol (INDERAL) 20 MG tablet Take 1 tablet (20 mg total) by mouth 2 (two) times daily. 06/13/17  Yes Sherwood Gambler, MD  amLODipine (NORVASC) 5 MG tablet Take 1 tablet (5 mg total) daily by mouth. 05/25/17 08/23/17  Minus Breeding, MD  benzonatate (TESSALON) 100 MG capsule Take 1 capsule (100 mg total) every 8 (eight) hours by mouth. 05/23/17   Long, Wonda Olds, MD  ondansetron (ZOFRAN ODT) 4 MG disintegrating tablet Take 1 tablet (4 mg total) every 8 (eight) hours as needed by mouth for nausea or vomiting. 05/23/17   Long, Wonda Olds, MD    Family History Family History  Problem Relation Age of Onset  . Diabetes Mother   . Hypertension Mother   . Thyroid disease Mother   . Kidney disease Maternal Grandmother   . Diabetes Maternal Grandmother     . Heart attack Unknown     Social History Social History   Tobacco Use  . Smoking status: Never Smoker  . Smokeless tobacco: Never Used  Substance Use Topics  . Alcohol use: No  . Drug use: No     Allergies   Patient has no known allergies.   Review of Systems Review of Systems  Constitutional: Positive for fatigue and fever. Negative for chills.  HENT: Negative for congestion.   Respiratory: Positive for shortness of breath. Negative for chest tightness.   Cardiovascular: Positive for leg swelling. Negative for chest pain.  Gastrointestinal: Positive for nausea. Negative for abdominal pain, diarrhea and vomiting.  Genitourinary: Negative for dyspareunia and urgency.  Musculoskeletal: Negative for neck pain and neck stiffness.  Skin: Negative for color change and wound.  Neurological: Negative for weakness, light-headedness and headaches.     Physical Exam Updated Vital Signs BP (!) 164/85   Pulse (!) 111   Temp 99.2 F (37.3 C) (Oral)   Resp (!) 24   Ht 5\' 5"  (1.651 m)   Wt 126.1 kg (278 lb)   LMP 09/17/2017   SpO2 100%   BMI 46.26 kg/m   Physical Exam  Constitutional: She is oriented to person, place, and time. She appears well-developed and well-nourished. No distress.  Morbidly obese female  HENT:  Head: Normocephalic and atraumatic.  Right Ear: Tympanic membrane, external ear and ear canal normal.  Left Ear: Tympanic membrane, external ear and ear canal normal.  Mouth/Throat: Uvula is midline, oropharynx is clear and moist and mucous membranes are normal.  Eyes: Conjunctivae are normal.  Neck: Normal range of motion. Neck supple.  Cardiovascular: Normal rate, regular rhythm, normal heart sounds and intact distal pulses.  No murmur heard. 3+ pitting edema of bilateral lower legs.  Bilateral lower legs appear symmetrical.  Pitting edema continues up to upper thighs.  She does have very slight pitting edema on her bilateral hands.  Pulmonary/Chest:  Effort normal and breath sounds normal. No respiratory distress.  Abdominal: Soft. Bowel sounds are normal. She exhibits no distension. There is no tenderness. There is no guarding.  Abdomen is obese  Musculoskeletal: Normal range of motion. She exhibits no edema.  Neurological: She is alert and oriented to person, place, and time. No sensory deficit.  Skin: Skin is warm and dry.  Psychiatric: She has a normal mood and affect.  Nursing note and vitals reviewed.    ED Treatments / Results  Labs (all labs ordered are listed, but only abnormal results are displayed) Labs Reviewed  CBC WITH DIFFERENTIAL/PLATELET - Abnormal; Notable for  the following components:      Result Value   RBC 3.39 (*)    Hemoglobin 9.4 (*)    HCT 28.0 (*)    All other components within normal limits  D-DIMER, QUANTITATIVE (NOT AT [facility]) - Abnormal; Notable for the following components:   D-Dimer, Quant 0.64 (*)    All other components within normal limits  COMPREHENSIVE METABOLIC PANEL - Abnormal; Notable for the following components:   Sodium 133 (*)    CO2 21 (*)    Glucose, Bld 316 (*)    BUN 21 (*)    Creatinine, Ser 1.33 (*)    Calcium 7.9 (*)    Total Protein 6.0 (*)    Albumin 2.5 (*)    GFR calc non Af Amer 50 (*)    GFR calc Af Amer 58 (*)    All other components within normal limits  URINALYSIS, ROUTINE W REFLEX MICROSCOPIC - Abnormal; Notable for the following components:   Glucose, UA >=500 (*)    Hgb urine dipstick MODERATE (*)    Protein, ur >300 (*)    All other components within normal limits  URINALYSIS, MICROSCOPIC (REFLEX) - Abnormal; Notable for the following components:   Bacteria, UA FEW (*)    Squamous Epithelial / LPF 0-5 (*)    All other components within normal limits  CBG MONITORING, ED - Abnormal; Notable for the following components:   Glucose-Capillary 307 (*)    All other components within normal limits  URINE CULTURE  BRAIN NATRIURETIC PEPTIDE  TROPONIN I    PREGNANCY, URINE    EKG  EKG Interpretation  Date/Time:  Sunday October 01 2017 20:04:15 EDT Ventricular Rate:  110 PR Interval:    QRS Duration: 88 QT Interval:  338 QTC Calculation: 458 R Axis:   128 Text Interpretation:  Sinus tachycardia Right axis deviation Low voltage, precordial leads Baseline wander in lead(s) V5 No significant change since last tracing Confirmed by Addison Lank [phone]) on 10/01/2017 8:08:27 PM Also confirmed by Addison Lank [phone]), editor Hattie Perch ([id number])  on 10/02/2017 7:57:06 AM       Radiology Dg Chest 2 View  Result Date: 10/01/2017 CLINICAL DATA:  37 year old female with shortness of breath and fever for 5 days. EXAM: CHEST - 2 VIEW COMPARISON:  07/16/2017 and earlier. FINDINGS: Large body habitus. Stable somewhat low lung volumes. Mediastinal contours remain within normal limits. Visualized tracheal air column is within normal limits. No pneumothorax, pulmonary edema, pleural effusion or confluent pulmonary opacity. No acute osseous abnormality identified. Negative visible bowel gas pattern. IMPRESSION: No acute cardiopulmonary abnormality. Electronically Signed   By: Genevie Ann M.D.   On: 10/01/2017 20:57   Ct Angio Chest Pe W/cm &/or Wo Cm  Result Date: 10/01/2017 CLINICAL DATA:  Shortness of breath.  Elevated D-dimer. EXAM: CT ANGIOGRAPHY CHEST WITH CONTRAST TECHNIQUE: Multidetector CT imaging of the chest was performed using the standard protocol during bolus administration of intravenous contrast. Multiplanar CT image reconstructions and MIPs were obtained to evaluate the vascular anatomy. CONTRAST:  156mL ISOVUE-370 IOPAMIDOL (ISOVUE-370) INJECTION 76% COMPARISON:  Chest radiograph earlier this day. Chest CT PE protocol 05/23/2017 FINDINGS: Cardiovascular: Limited evaluation of the pulmonary arteries. There are no filling defects within the pulmonary arteries to the lobar level to suggest pulmonary embolus. Segmental and subsegmental branches  cannot be assessed due to contrast bolus timing and soft tissue attenuation from body habitus. Prominent main pulmonary artery spanning 3.2 cm, unchanged from prior exam allowing for differences in  caliper placement. Borderline cardiomegaly. No aortic dissection or aneurysm. Physiologic pericardial fluid versus small effusion. Mediastinum/Nodes: No enlarged mediastinal or hilar lymph nodes. The esophagus is decompressed. Visualized thyroid gland is normal. Lungs/Pleura: No consolidation, pleural effusion or pulmonary edema. No pulmonary mass. Trace fissural thickening in the interlobar fissure on the right, unchanged. Upper Abdomen: No acute finding. Low-density thickening of the left adrenal gland is unchanged and likely secondary to adenoma. Musculoskeletal: Multilevel degenerative change throughout the thoracic spine. There are no acute or suspicious osseous abnormalities. Review of the MIP images confirms the above findings. IMPRESSION: 1. No central pulmonary embolus. Technically limited exam due to contrast bolus and soft tissue attenuation from habitus. 2. No acute intrathoracic abnormality. 3. Unchanged dilated main pulmonary artery which can be seen with pulmonary arterial hypertension. Electronically Signed   By: Jeb Levering M.D.   On: 10/01/2017 23:20    Procedures Procedures (including critical care time)  Medications Ordered in ED Medications  sodium chloride 0.9 % bolus 500 mL (0 mLs Intravenous Stopped 10/02/17 0008)  iopamidol (ISOVUE-370) 76 % injection 100 mL (100 mLs Intravenous Contrast Given 10/01/17 2230)     Initial Impression / Assessment and Plan / ED Course  I have reviewed the triage vital signs and the nursing notes.  Pertinent labs & imaging results that were available during my care of the patient were reviewed by me and considered in my medical decision making (see chart for details).     Patient presents today for evaluation of fevers and increased leg pain.   She  also reports shortness of breath and generally not feeling well.  Legs she had significant pitting edema bilaterally.  Labs were obtained and reviewed, BNP 43.7, glucose 316 large amounts of glucose in her urine and over 300 protein and blood.  Based on shortness of breath d-dimer was obtained which was mildly elevated at 0.64.  Discussed the role of CT scan for PE evaluation and she elected to have CT scan.  CT scan did not show any evidence of PE.  She was tachycardic consistently while in the emergency room, chart review shows that she has previously been tachycardic on multiple other visits.  She is not having any urinary symptoms, do not suspect UTI.  Her creatinine, hemoglobin, and other labs all appear consistent with her baseline today.  She was advised that she needs to elevate her legs above her heart, start wearing compression stockings, and follow-up with her primary care provider.  She was mildly hypertensive in the emergency room.  She was instructed that she needs to follow-up with her primary care provider and/or specialist regarding her uncontrolled diabetes, her uncontrolled blood pressure, along with her ED visit and symptoms today.  Suspect viral URI.  Patient was not wheezing on exam, therefore albuterol was not given.     This patient was seen as a shared visit with Dr. Leonette Monarch who evaluated the patient and agreed with my plan. Final Clinical Impressions(s) / ED Diagnoses   Final diagnoses:  Leg swelling  Tachycardia  Elevated d-dimer    ED Discharge Orders    None       Lorin Glass, [place] 10/02/17 1405

## 2017-10-02 NOTE — ED Provider Notes (Signed)
Medical screening examination/treatment/procedure(s) were conducted as a shared visit with non-physician practitioner(s) and myself.  I personally evaluated the patient during the encounter. Briefly, the patient is a 37 y.o. female here with fever, sob, and myalgia. Reported sick contacts at home. Work up negative for PNA, PE, or HF. Likely viral process. The patient appears reasonably screened and/or stabilized for discharge and I doubt any other medical condition or other [facility] requiring further screening, evaluation, or treatment in the ED at this time prior to discharge. The patient is safe for discharge with strict return precautions.      EKG Interpretation  Date/Time:  Sunday October 01 2017 20:04:15 EDT Ventricular Rate:  110 PR Interval:    QRS Duration: 88 QT Interval:  338 QTC Calculation: 458 R Axis:   128 Text Interpretation:  Sinus tachycardia Right axis deviation Low voltage, precordial leads Baseline wander in lead(s) V5 No significant change since last tracing Confirmed by Addison Lank [phone] on 10/01/2017 8:08:27 PM           Tayelor Osborne, Grayce Sessions, MD 10/02/17 [id number]

## 2017-10-03 LAB — URINE CULTURE

## 2017-10-10 DIAGNOSIS — E118 Type 2 diabetes mellitus with unspecified complications: Secondary | ICD-10-CM | POA: Diagnosis not present

## 2017-10-10 DIAGNOSIS — Z6841 Body Mass Index (BMI) 40.0 and over, adult: Secondary | ICD-10-CM | POA: Diagnosis not present

## 2017-10-12 DIAGNOSIS — I10 Essential (primary) hypertension: Secondary | ICD-10-CM | POA: Diagnosis not present

## 2017-10-12 DIAGNOSIS — G4733 Obstructive sleep apnea (adult) (pediatric): Secondary | ICD-10-CM | POA: Diagnosis not present

## 2017-10-12 DIAGNOSIS — Z6841 Body Mass Index (BMI) 40.0 and over, adult: Secondary | ICD-10-CM | POA: Diagnosis not present

## 2017-10-12 DIAGNOSIS — E118 Type 2 diabetes mellitus with unspecified complications: Secondary | ICD-10-CM | POA: Diagnosis not present

## 2017-11-01 DIAGNOSIS — Z6841 Body Mass Index (BMI) 40.0 and over, adult: Secondary | ICD-10-CM | POA: Diagnosis not present

## 2017-11-01 DIAGNOSIS — Z713 Dietary counseling and surveillance: Secondary | ICD-10-CM | POA: Diagnosis not present

## 2017-11-21 DIAGNOSIS — E118 Type 2 diabetes mellitus with unspecified complications: Secondary | ICD-10-CM | POA: Diagnosis not present

## 2017-11-21 DIAGNOSIS — I10 Essential (primary) hypertension: Secondary | ICD-10-CM | POA: Diagnosis not present

## 2017-11-21 DIAGNOSIS — Z6841 Body Mass Index (BMI) 40.0 and over, adult: Secondary | ICD-10-CM | POA: Diagnosis not present

## 2017-11-21 DIAGNOSIS — G4733 Obstructive sleep apnea (adult) (pediatric): Secondary | ICD-10-CM | POA: Diagnosis not present

## 2017-11-23 DIAGNOSIS — Z7189 Other specified counseling: Secondary | ICD-10-CM | POA: Diagnosis not present

## 2017-11-23 DIAGNOSIS — F54 Psychological and behavioral factors associated with disorders or diseases classified elsewhere: Secondary | ICD-10-CM | POA: Diagnosis not present

## 2017-11-23 DIAGNOSIS — Z6841 Body Mass Index (BMI) 40.0 and over, adult: Secondary | ICD-10-CM | POA: Diagnosis not present

## 2017-11-27 DIAGNOSIS — E1165 Type 2 diabetes mellitus with hyperglycemia: Secondary | ICD-10-CM | POA: Diagnosis not present

## 2017-11-27 DIAGNOSIS — G4733 Obstructive sleep apnea (adult) (pediatric): Secondary | ICD-10-CM | POA: Diagnosis not present

## 2017-11-27 DIAGNOSIS — N182 Chronic kidney disease, stage 2 (mild): Secondary | ICD-10-CM | POA: Diagnosis not present

## 2017-11-27 DIAGNOSIS — I10 Essential (primary) hypertension: Secondary | ICD-10-CM | POA: Diagnosis not present

## 2017-12-19 DIAGNOSIS — Z136 Encounter for screening for cardiovascular disorders: Secondary | ICD-10-CM | POA: Diagnosis not present

## 2017-12-19 DIAGNOSIS — E118 Type 2 diabetes mellitus with unspecified complications: Secondary | ICD-10-CM | POA: Diagnosis not present

## 2017-12-19 DIAGNOSIS — Z13220 Encounter for screening for lipoid disorders: Secondary | ICD-10-CM | POA: Diagnosis not present

## 2017-12-19 DIAGNOSIS — Z6841 Body Mass Index (BMI) 40.0 and over, adult: Secondary | ICD-10-CM | POA: Diagnosis not present

## 2017-12-19 DIAGNOSIS — I10 Essential (primary) hypertension: Secondary | ICD-10-CM | POA: Diagnosis not present

## 2017-12-21 DIAGNOSIS — E559 Vitamin D deficiency, unspecified: Secondary | ICD-10-CM | POA: Insufficient documentation

## 2018-01-23 DIAGNOSIS — Z6841 Body Mass Index (BMI) 40.0 and over, adult: Secondary | ICD-10-CM | POA: Diagnosis not present

## 2018-01-23 DIAGNOSIS — G4733 Obstructive sleep apnea (adult) (pediatric): Secondary | ICD-10-CM | POA: Diagnosis not present

## 2018-01-23 DIAGNOSIS — E118 Type 2 diabetes mellitus with unspecified complications: Secondary | ICD-10-CM | POA: Diagnosis not present

## 2018-01-23 DIAGNOSIS — I10 Essential (primary) hypertension: Secondary | ICD-10-CM | POA: Diagnosis not present

## 2018-01-24 DIAGNOSIS — Z713 Dietary counseling and surveillance: Secondary | ICD-10-CM | POA: Diagnosis not present

## 2018-01-24 DIAGNOSIS — Z6841 Body Mass Index (BMI) 40.0 and over, adult: Secondary | ICD-10-CM | POA: Diagnosis not present

## 2018-01-31 DIAGNOSIS — E559 Vitamin D deficiency, unspecified: Secondary | ICD-10-CM | POA: Diagnosis not present

## 2018-01-31 DIAGNOSIS — Z6841 Body Mass Index (BMI) 40.0 and over, adult: Secondary | ICD-10-CM | POA: Diagnosis not present

## 2018-01-31 DIAGNOSIS — I10 Essential (primary) hypertension: Secondary | ICD-10-CM | POA: Diagnosis not present

## 2018-02-14 ENCOUNTER — Emergency Department (HOSPITAL_BASED_OUTPATIENT_CLINIC_OR_DEPARTMENT_OTHER): Payer: BLUE CROSS/BLUE SHIELD

## 2018-02-14 ENCOUNTER — Emergency Department (HOSPITAL_BASED_OUTPATIENT_CLINIC_OR_DEPARTMENT_OTHER)
Admission: EM | Admit: 2018-02-14 | Discharge: 2018-02-14 | Disposition: A | Discharge status: Home / Self Care | Payer: BLUE CROSS/BLUE SHIELD | Attending: Emergency Medicine | Admitting: Emergency Medicine

## 2018-02-14 ENCOUNTER — Other Ambulatory Visit: Payer: Self-pay

## 2018-02-14 ENCOUNTER — Encounter (HOSPITAL_BASED_OUTPATIENT_CLINIC_OR_DEPARTMENT_OTHER): Payer: Self-pay | Admitting: Emergency Medicine

## 2018-02-14 DIAGNOSIS — M7989 Other specified soft tissue disorders: Secondary | ICD-10-CM | POA: Diagnosis not present

## 2018-02-14 DIAGNOSIS — Z79899 Other long term (current) drug therapy: Secondary | ICD-10-CM | POA: Insufficient documentation

## 2018-02-14 DIAGNOSIS — E119 Type 2 diabetes mellitus without complications: Secondary | ICD-10-CM | POA: Diagnosis not present

## 2018-02-14 DIAGNOSIS — R079 Chest pain, unspecified: Secondary | ICD-10-CM | POA: Diagnosis not present

## 2018-02-14 DIAGNOSIS — Z794 Long term (current) use of insulin: Secondary | ICD-10-CM | POA: Diagnosis not present

## 2018-02-14 DIAGNOSIS — R0602 Shortness of breath: Secondary | ICD-10-CM | POA: Diagnosis not present

## 2018-02-14 DIAGNOSIS — I10 Essential (primary) hypertension: Secondary | ICD-10-CM

## 2018-02-14 DIAGNOSIS — R600 Localized edema: Secondary | ICD-10-CM | POA: Insufficient documentation

## 2018-02-14 DIAGNOSIS — R05 Cough: Secondary | ICD-10-CM | POA: Insufficient documentation

## 2018-02-14 DIAGNOSIS — R0789 Other chest pain: Secondary | ICD-10-CM | POA: Diagnosis not present

## 2018-02-14 DIAGNOSIS — R42 Dizziness and giddiness: Secondary | ICD-10-CM | POA: Diagnosis not present

## 2018-02-14 LAB — CBC
HEMATOCRIT: 28.4 % — AB (ref 36.0–46.0)
HEMOGLOBIN: 9.7 g/dL — AB (ref 12.0–15.0)
MCH: 27.9 pg (ref 26.0–34.0)
MCHC: 34.2 g/dL (ref 30.0–36.0)
MCV: 81.6 fL (ref 78.0–100.0)
Platelets: 348 10*3/uL (ref 150–400)
RBC: 3.48 MIL/uL — ABNORMAL LOW (ref 3.87–5.11)
RDW: 13 % (ref 11.5–15.5)
WBC: 11 10*3/uL — AB (ref 4.0–10.5)

## 2018-02-14 LAB — BASIC METABOLIC PANEL
ANION GAP: 9 (ref 5–15)
BUN: 23 mg/dL — ABNORMAL HIGH (ref 6–20)
CHLORIDE: 105 mmol/L (ref 98–111)
CO2: 23 mmol/L (ref 22–32)
Calcium: 8.4 mg/dL — ABNORMAL LOW (ref 8.9–10.3)
Creatinine, Ser: 1.58 mg/dL — ABNORMAL HIGH (ref 0.44–1.00)
GFR calc Af Amer: 47 mL/min — ABNORMAL LOW (ref >60)
GFR, EST NON AFRICAN AMERICAN: 41 mL/min — AB (ref >60)
GLUCOSE: 240 mg/dL — AB (ref 70–99)
POTASSIUM: 3.6 mmol/L (ref 3.5–5.1)
SODIUM: 137 mmol/L (ref 135–145)

## 2018-02-14 LAB — TROPONIN I
Troponin I: <0.03 ng/mL (ref <0.03)
Troponin I: <0.03 ng/mL (ref <0.03)

## 2018-02-14 LAB — BRAIN NATRIURETIC PEPTIDE: B NATRIURETIC PEPTIDE 5: 98.9 pg/mL (ref 0.0–100.0)

## 2018-02-14 LAB — D-DIMER, QUANTITATIVE (NOT AT ARMC): D DIMER QUANT: 1.16 ug{FEU}/mL — AB (ref 0.00–0.50)

## 2018-02-14 MED ORDER — ASPIRIN 81 MG PO CHEW
324.0000 mg | CHEWABLE_TABLET | Freq: Once | ORAL | Status: AC
  Administered 2018-02-14: 324 mg via ORAL
  Filled 2018-02-14: qty 4

## 2018-02-14 MED ORDER — ACETAMINOPHEN 500 MG PO TABS
1000.0000 mg | ORAL_TABLET | Freq: Once | ORAL | Status: AC
  Administered 2018-02-14: 1000 mg via ORAL
  Filled 2018-02-14: qty 2

## 2018-02-14 MED ORDER — IOPAMIDOL (ISOVUE-370) INJECTION 76%
100.0000 mL | Freq: Once | INTRAVENOUS | Status: AC | PRN
  Administered 2018-02-14: 100 mL via INTRAVENOUS

## 2018-02-14 NOTE — Discharge Instructions (Addendum)
If your chest pain worsens or you develop severe pain, headache, vomiting, shortness of breath, or any other new/concerning symptoms then return to the ER for evaluation.  Otherwise follow-up with your primary care physician and/or cardiologist.

## 2018-02-14 NOTE — ED Triage Notes (Signed)
Chest tightness with SOB since yesterday.

## 2018-02-14 NOTE — ED Notes (Signed)
Pt on monitor 

## 2018-02-14 NOTE — ED Provider Notes (Signed)
[facility] EMERGENCY DEPARTMENT Provider Note   CSN: [id number] Arrival date & time: 02/14/18  4742     History   Chief Complaint Chief Complaint  Patient presents with  . Chest Pain    HPI Jacqueline [name] is a 36 y.o. female.  HPI  37 year old female with a history of obesity and type 2 diabetes presents with chest tightness.  Started 3 days ago.  She states last week she had a cold that came and went.  Now she is having chest tightness, cough with some green sputum, shortness of breath and some dizziness.  Has been noticing some leg swelling to her bilateral lower extremities from calves to feet.  Has been trying Tylenol and ibuprofen with no relief.  She noticed a fever of 101 yesterday.  No vomiting but has had some nausea.  The tightness is at the inferior aspect of her sternum.  Pain is about a 7 out of 10. Some shortness of breath worsens when lying flat.  Past Medical History:  Diagnosis Date  . Anemia   . Anxiety   . Chronic bronchitis ([place])   . Hypertension   . Increased frequency of headaches   . Morbid obesity ([place])   . Sleep apnea   . Type II diabetes mellitus [facility])     Patient Active Problem List   Diagnosis Date Noted  . Atrial fibrillation, permanent ([place]) 05/25/2017  . Sepsis ([place]) 05/03/2017    Class: Present on Admission  . AKI (acute kidney injury) ([place]) 05/03/2017    Class: Stage 3  . Neck pain   . Acidosis, metabolic   . Hyponatremia   . Type II diabetes mellitus ([place]) 04/30/2017  . Anemia 04/30/2017  . Staphylococcus aureus bacteremia 03/15/2016  . Streptococcal bacteremia 03/15/2016  . Tachycardia 03/14/2016  . Fever 03/14/2016  . Bad headache 03/14/2016  . Morbid obesity ([place])   . HTN (hypertension) 12/22/2015  . Diabetes ([place]) 01/01/2014    Past Surgical History:  Procedure Laterality Date  . BREAST REDUCTION SURGERY  03/21/2017  . CARDIAC CATHETERIZATION  01/06/2016  . CARDIAC CATHETERIZATION N/A 01/06/2016   Procedure:  Left Heart Cath and Coronary Angiography;  Surgeon: Wellington Hampshire, MD;  Location: [place] [facility] CV LAB;  Service: Cardiovascular;  Laterality: N/A;  . CESAREAN SECTION  08/2014     OB History    Gravida  1   Para      Term      Preterm      AB      Living        SAB      TAB      Ectopic      Multiple      Live Births               Home Medications    Prior to Admission medications   Medication Sig Start Date End Date Taking? Authorizing Provider  amLODipine (NORVASC) 5 MG tablet Take 1 tablet (5 mg total) daily by mouth. 05/25/17 08/23/17  Minus Breeding, MD  benzonatate (TESSALON) 100 MG capsule Take 1 capsule (100 mg total) every 8 (eight) hours by mouth. 05/23/17   Long, Wonda Olds, MD  bumetanide (BUMEX) 1 MG tablet Take 1 mg by mouth daily.    [provider]  insulin aspart (NOVOLOG) 100 UNIT/ML injection Inject 5 Units into the skin 3 (three) times daily with meals. 05/02/17   Donne Hazel, MD  Insulin Glargine (LANTUS) 100 UNIT/ML Solostar  Pen Inject 20 Units into the skin daily at 10 pm. 05/02/17   Donne Hazel, MD  metFORMIN (GLUCOPHAGE) 500 MG tablet Take by mouth 2 (two) times daily with a meal.    [provider]  olmesartan (BENICAR) 5 MG tablet Take daily by mouth.    [provider]  ondansetron (ZOFRAN ODT) 4 MG disintegrating tablet Take 1 tablet (4 mg total) every 8 (eight) hours as needed by mouth for nausea or vomiting. 05/23/17   Long, Wonda Olds, MD  propranolol (INDERAL) 20 MG tablet Take 1 tablet (20 mg total) by mouth 2 (two) times daily. 06/13/17   Sherwood Gambler, MD    Family History Family History  Problem Relation Age of Onset  . Diabetes Mother   . Hypertension Mother   . Thyroid disease Mother   . Kidney disease Maternal Grandmother   . Diabetes Maternal Grandmother   . Heart attack Unknown     Social History Social History   Tobacco Use  . Smoking status: Never Smoker  . Smokeless tobacco:  Never Used  Substance Use Topics  . Alcohol use: No  . Drug use: No     Allergies   Patient has no known allergies.   Review of Systems Review of Systems  Constitutional: Positive for fever.  Respiratory: Positive for cough, chest tightness and shortness of breath.   Cardiovascular: Positive for leg swelling.  Gastrointestinal: Positive for nausea. Negative for abdominal pain and vomiting.  All other systems reviewed and are negative.    Physical Exam Updated Vital Signs BP (!) 171/92   Pulse (!) 102   Temp 98.9 F (37.2 C) (Oral)   Resp 16   Ht 5\' 4"  (1.626 m)   Wt 133.4 kg (294 lb)   LMP 01/12/2018   SpO2 99%   BMI 50.46 kg/m   Physical Exam  Constitutional: She is oriented to person, place, and time. She appears well-developed and well-nourished.  Non-toxic appearance. She does not appear ill. No distress.  obese  HENT:  Head: Normocephalic and atraumatic.  Right Ear: External ear normal.  Left Ear: External ear normal.  Nose: Nose normal.  Eyes: Right eye exhibits no discharge. Left eye exhibits no discharge.  Cardiovascular: Regular rhythm and normal heart sounds. Tachycardia present.  HR~110  Pulmonary/Chest: Effort normal and breath sounds normal. She has no wheezes.  Abdominal: Soft. There is no tenderness.  Musculoskeletal:       Right lower leg: She exhibits edema.       Left lower leg: She exhibits edema.  Pitting edema to BLE, calves to feet  Neurological: She is alert and oriented to person, place, and time.  Skin: Skin is warm and dry.  Nursing note and vitals reviewed.    ED Treatments / Results  Labs (all labs ordered are listed, but only abnormal results are displayed) Labs Reviewed  BASIC METABOLIC PANEL - Abnormal; Notable for the following components:      Result Value   Glucose, Bld 240 (*)    BUN 23 (*)    Creatinine, Ser 1.58 (*)    Calcium 8.4 (*)    GFR calc non Af Amer 41 (*)    GFR calc Af Amer 47 (*)    All other  components within normal limits  CBC - Abnormal; Notable for the following components:   WBC 11.0 (*)    RBC 3.48 (*)    Hemoglobin 9.7 (*)    HCT 28.4 (*)  All other components within normal limits  D-DIMER, QUANTITATIVE (NOT AT [facility]) - Abnormal; Notable for the following components:   D-Dimer, Quant 1.16 (*)    All other components within normal limits  TROPONIN I  BRAIN NATRIURETIC PEPTIDE  TROPONIN I  PREGNANCY, URINE    EKG EKG Interpretation  Date/Time:  Wednesday February 14 2018 08:36:01 EDT Ventricular Rate:  109 PR Interval:    QRS Duration: 84 QT Interval:  355 QTC Calculation: 478 R Axis:   116 Text Interpretation:  Sinus tachycardia Right axis deviation Low voltage, precordial leads Borderline T wave abnormalities nonspecific T waves similar to  mar 2019 Confirmed by Sherwood Gambler [phone]) on 02/14/2018 8:53:29 AM   EKG Interpretation  Date/Time:  Wednesday February 14 2018 11:59:09 EDT Ventricular Rate:  106 PR Interval:    QRS Duration: 88 QT Interval:  353 QTC Calculation: 469 R Axis:   113 Text Interpretation:  Sinus tachycardia Right axis deviation Borderline repolarization abnormality no significant change since earlier in the day Confirmed by Sherwood Gambler [phone]) on 02/14/2018 12:49:30 PM        Radiology Dg Chest 2 View  Result Date: 02/14/2018 CLINICAL DATA:  Chest pain and shortness of breath over the last 2 days. EXAM: CHEST - 2 VIEW COMPARISON:  10/01/2017 FINDINGS: Borderline cardiomegaly as seen previously. Pulmonary vascularity is normal. Lungs are clear. No effusions. Ordinary degenerative changes affect the spine. IMPRESSION: No active cardiopulmonary disease. Electronically Signed   By: Nelson Chimes M.D.   On: 02/14/2018 08:51   Ct Angio Chest Pe W/cm &/or Wo Cm  Result Date: 02/14/2018 CLINICAL DATA:  Chest tightness and shortness of breath since yesterday. Elevated D-dimer. EXAM: CT ANGIOGRAPHY CHEST WITH CONTRAST TECHNIQUE: Multidetector  CT imaging of the chest was performed using the standard protocol during bolus administration of intravenous contrast. Multiplanar CT image reconstructions and MIPs were obtained to evaluate the vascular anatomy. CONTRAST:  157mL ISOVUE-370 IOPAMIDOL (ISOVUE-370) INJECTION 76% COMPARISON:  Chest radiography same day.  Previous CT 10/01/2017 FINDINGS: Cardiovascular: Pulmonary arterial opacification is moderate. No pulmonary emboli are seen. A small distal embolus might be inapparent. The heart is enlarged. No aortic atherosclerosis is seen. No pericardial effusion. Mediastinum/Nodes: Normal Lungs/Pleura: Tiny right effusion with mild right base atelectasis. Otherwise negative. Upper Abdomen: Normal Musculoskeletal: Contiguous osteophyte formation throughout the thoracic spine, premature for age, likely indicating diffuse idiopathic skeletal hyperostosis. Review of the MIP images confirms the above findings. IMPRESSION: No large or medium-sized pulmonary embolus. Because of moderate contrast opacity and some motion, small distal emboli are not excluded, though not specifically suggested. Mild chronic cardiomegaly. Tiny right effusion with mild right base atelectasis. Multi segment bridging osteophytes, suggesting diffuse idiopathic skeletal hyperostosis. Electronically Signed   By: Nelson Chimes M.D.   On: 02/14/2018 11:18    Procedures Procedures (including critical care time)  Medications Ordered in ED Medications  acetaminophen (TYLENOL) tablet 1,000 mg (1,000 mg Oral Given 02/14/18 0938)  aspirin chewable tablet 324 mg (324 mg Oral Given 02/14/18 0938)  iopamidol (ISOVUE-370) 76 % injection 100 mL (100 mLs Intravenous Contrast Given 02/14/18 1051)     Initial Impression / Assessment and Plan / ED Course  I have reviewed the triage vital signs and the nursing notes.  Pertinent labs & imaging results that were available during my care of the patient were reviewed by me and considered in my medical  decision making (see chart for details).     Patient is tachycardic although after work-up was started  it is noted that the tachycardia is chronic.  Even with treatment she has always been some level of tachycardic.  She does have pitting edema but no evidence of heart failure otherwise including on the CT scan.  There is no obvious PE.  Unclear why she is having the chest pain but I highly doubt ACS, especially with get of cath 2 years ago and 2 troponins that are negative.  No pericardial effusion on CT.  No obvious pneumonia.  ECG is not normal but unchanged.  It appears that she is stable for discharge home to follow-up with her PCP.  Return precautions.  Final Clinical Impressions(s) / ED Diagnoses   Final diagnoses:  Nonspecific chest pain  Essential hypertension    ED Discharge Orders    None       Sherwood Gambler, MD 02/14/18 1253

## 2018-03-01 DIAGNOSIS — Z6841 Body Mass Index (BMI) 40.0 and over, adult: Secondary | ICD-10-CM | POA: Diagnosis not present

## 2018-03-01 DIAGNOSIS — G4733 Obstructive sleep apnea (adult) (pediatric): Secondary | ICD-10-CM | POA: Diagnosis not present

## 2018-03-01 DIAGNOSIS — Z713 Dietary counseling and surveillance: Secondary | ICD-10-CM | POA: Diagnosis not present

## 2018-03-08 DIAGNOSIS — E1169 Type 2 diabetes mellitus with other specified complication: Secondary | ICD-10-CM | POA: Insufficient documentation

## 2018-03-08 DIAGNOSIS — N183 Chronic kidney disease, stage 3 unspecified: Secondary | ICD-10-CM | POA: Insufficient documentation

## 2018-03-08 DIAGNOSIS — Z794 Long term (current) use of insulin: Secondary | ICD-10-CM | POA: Diagnosis not present

## 2018-03-08 DIAGNOSIS — E1122 Type 2 diabetes mellitus with diabetic chronic kidney disease: Secondary | ICD-10-CM | POA: Insufficient documentation

## 2018-03-08 DIAGNOSIS — I129 Hypertensive chronic kidney disease with stage 1 through stage 4 chronic kidney disease, or unspecified chronic kidney disease: Secondary | ICD-10-CM | POA: Insufficient documentation

## 2018-03-08 DIAGNOSIS — E1165 Type 2 diabetes mellitus with hyperglycemia: Secondary | ICD-10-CM | POA: Diagnosis not present

## 2018-03-08 DIAGNOSIS — E785 Hyperlipidemia, unspecified: Secondary | ICD-10-CM | POA: Insufficient documentation

## 2018-03-08 DIAGNOSIS — E1140 Type 2 diabetes mellitus with diabetic neuropathy, unspecified: Secondary | ICD-10-CM | POA: Diagnosis not present

## 2018-04-01 DIAGNOSIS — G4733 Obstructive sleep apnea (adult) (pediatric): Secondary | ICD-10-CM | POA: Diagnosis not present

## 2018-04-03 DIAGNOSIS — E1122 Type 2 diabetes mellitus with diabetic chronic kidney disease: Secondary | ICD-10-CM | POA: Diagnosis not present

## 2018-04-03 DIAGNOSIS — Z794 Long term (current) use of insulin: Secondary | ICD-10-CM | POA: Diagnosis not present

## 2018-04-03 DIAGNOSIS — Z6841 Body Mass Index (BMI) 40.0 and over, adult: Secondary | ICD-10-CM | POA: Diagnosis not present

## 2018-04-03 DIAGNOSIS — I10 Essential (primary) hypertension: Secondary | ICD-10-CM | POA: Diagnosis not present

## 2018-04-03 DIAGNOSIS — G4733 Obstructive sleep apnea (adult) (pediatric): Secondary | ICD-10-CM | POA: Diagnosis not present

## 2018-04-03 DIAGNOSIS — E1165 Type 2 diabetes mellitus with hyperglycemia: Secondary | ICD-10-CM | POA: Diagnosis not present

## 2018-04-03 DIAGNOSIS — N183 Chronic kidney disease, stage 3 (moderate): Secondary | ICD-10-CM | POA: Diagnosis not present

## 2018-04-11 DIAGNOSIS — Z6841 Body Mass Index (BMI) 40.0 and over, adult: Secondary | ICD-10-CM | POA: Diagnosis not present

## 2018-04-11 DIAGNOSIS — Z713 Dietary counseling and surveillance: Secondary | ICD-10-CM | POA: Diagnosis not present

## 2018-04-30 DIAGNOSIS — I10 Essential (primary) hypertension: Secondary | ICD-10-CM | POA: Diagnosis not present

## 2018-04-30 DIAGNOSIS — G4733 Obstructive sleep apnea (adult) (pediatric): Secondary | ICD-10-CM | POA: Diagnosis not present

## 2018-04-30 DIAGNOSIS — R7989 Other specified abnormal findings of blood chemistry: Secondary | ICD-10-CM | POA: Diagnosis not present

## 2018-04-30 DIAGNOSIS — E782 Mixed hyperlipidemia: Secondary | ICD-10-CM | POA: Diagnosis not present

## 2018-04-30 DIAGNOSIS — Z23 Encounter for immunization: Secondary | ICD-10-CM | POA: Diagnosis not present

## 2018-04-30 DIAGNOSIS — E1165 Type 2 diabetes mellitus with hyperglycemia: Secondary | ICD-10-CM | POA: Diagnosis not present

## 2018-04-30 DIAGNOSIS — Z6841 Body Mass Index (BMI) 40.0 and over, adult: Secondary | ICD-10-CM | POA: Diagnosis not present

## 2018-04-30 DIAGNOSIS — E1121 Type 2 diabetes mellitus with diabetic nephropathy: Secondary | ICD-10-CM | POA: Diagnosis not present

## 2018-05-01 DIAGNOSIS — G4733 Obstructive sleep apnea (adult) (pediatric): Secondary | ICD-10-CM | POA: Diagnosis not present

## 2018-05-03 ENCOUNTER — Other Ambulatory Visit: Payer: Self-pay

## 2018-05-03 ENCOUNTER — Emergency Department (HOSPITAL_BASED_OUTPATIENT_CLINIC_OR_DEPARTMENT_OTHER): Payer: BLUE CROSS/BLUE SHIELD

## 2018-05-03 ENCOUNTER — Encounter (HOSPITAL_BASED_OUTPATIENT_CLINIC_OR_DEPARTMENT_OTHER): Payer: Self-pay

## 2018-05-03 ENCOUNTER — Observation Stay (HOSPITAL_BASED_OUTPATIENT_CLINIC_OR_DEPARTMENT_OTHER)
Admission: EM | Admit: 2018-05-03 | Discharge: 2018-05-06 | Disposition: A | Discharge status: Home / Self Care | Payer: BLUE CROSS/BLUE SHIELD | Attending: Internal Medicine | Admitting: Internal Medicine

## 2018-05-03 DIAGNOSIS — G4733 Obstructive sleep apnea (adult) (pediatric): Secondary | ICD-10-CM | POA: Diagnosis not present

## 2018-05-03 DIAGNOSIS — E1122 Type 2 diabetes mellitus with diabetic chronic kidney disease: Secondary | ICD-10-CM

## 2018-05-03 DIAGNOSIS — R202 Paresthesia of skin: Secondary | ICD-10-CM

## 2018-05-03 DIAGNOSIS — R0789 Other chest pain: Secondary | ICD-10-CM | POA: Diagnosis not present

## 2018-05-03 DIAGNOSIS — R200 Anesthesia of skin: Secondary | ICD-10-CM | POA: Diagnosis not present

## 2018-05-03 DIAGNOSIS — Z79899 Other long term (current) drug therapy: Secondary | ICD-10-CM | POA: Diagnosis not present

## 2018-05-03 DIAGNOSIS — E1165 Type 2 diabetes mellitus with hyperglycemia: Secondary | ICD-10-CM | POA: Diagnosis not present

## 2018-05-03 DIAGNOSIS — R51 Headache: Secondary | ICD-10-CM | POA: Diagnosis not present

## 2018-05-03 DIAGNOSIS — I160 Hypertensive urgency: Principal | ICD-10-CM | POA: Diagnosis present

## 2018-05-03 DIAGNOSIS — J811 Chronic pulmonary edema: Secondary | ICD-10-CM | POA: Diagnosis not present

## 2018-05-03 DIAGNOSIS — R079 Chest pain, unspecified: Secondary | ICD-10-CM

## 2018-05-03 DIAGNOSIS — Z7982 Long term (current) use of aspirin: Secondary | ICD-10-CM | POA: Diagnosis not present

## 2018-05-03 DIAGNOSIS — I1310 Hypertensive heart and chronic kidney disease without heart failure, with stage 1 through stage 4 chronic kidney disease, or unspecified chronic kidney disease: Secondary | ICD-10-CM | POA: Insufficient documentation

## 2018-05-03 DIAGNOSIS — I4821 Permanent atrial fibrillation: Secondary | ICD-10-CM

## 2018-05-03 DIAGNOSIS — F419 Anxiety disorder, unspecified: Secondary | ICD-10-CM | POA: Insufficient documentation

## 2018-05-03 DIAGNOSIS — N183 Chronic kidney disease, stage 3 unspecified: Secondary | ICD-10-CM | POA: Diagnosis present

## 2018-05-03 DIAGNOSIS — E871 Hypo-osmolality and hyponatremia: Secondary | ICD-10-CM | POA: Insufficient documentation

## 2018-05-03 DIAGNOSIS — R7989 Other specified abnormal findings of blood chemistry: Secondary | ICD-10-CM | POA: Diagnosis present

## 2018-05-03 DIAGNOSIS — E118 Type 2 diabetes mellitus with unspecified complications: Secondary | ICD-10-CM | POA: Diagnosis not present

## 2018-05-03 DIAGNOSIS — N179 Acute kidney failure, unspecified: Secondary | ICD-10-CM

## 2018-05-03 DIAGNOSIS — D631 Anemia in chronic kidney disease: Secondary | ICD-10-CM | POA: Insufficient documentation

## 2018-05-03 DIAGNOSIS — D649 Anemia, unspecified: Secondary | ICD-10-CM | POA: Diagnosis present

## 2018-05-03 DIAGNOSIS — Z6841 Body Mass Index (BMI) 40.0 and over, adult: Secondary | ICD-10-CM | POA: Diagnosis not present

## 2018-05-03 DIAGNOSIS — R519 Headache, unspecified: Secondary | ICD-10-CM | POA: Diagnosis present

## 2018-05-03 DIAGNOSIS — R778 Other specified abnormalities of plasma proteins: Secondary | ICD-10-CM | POA: Diagnosis present

## 2018-05-03 DIAGNOSIS — Z794 Long term (current) use of insulin: Secondary | ICD-10-CM

## 2018-05-03 DIAGNOSIS — I10 Essential (primary) hypertension: Secondary | ICD-10-CM

## 2018-05-03 DIAGNOSIS — E1169 Type 2 diabetes mellitus with other specified complication: Secondary | ICD-10-CM | POA: Diagnosis not present

## 2018-05-03 DIAGNOSIS — E785 Hyperlipidemia, unspecified: Secondary | ICD-10-CM | POA: Diagnosis not present

## 2018-05-03 DIAGNOSIS — E1129 Type 2 diabetes mellitus with other diabetic kidney complication: Secondary | ICD-10-CM | POA: Diagnosis present

## 2018-05-03 LAB — CBC WITH DIFFERENTIAL/PLATELET
Abs Immature Granulocytes: 0.04 10*3/uL (ref 0.00–0.07)
BASOS ABS: 0 10*3/uL (ref 0.0–0.1)
Basophils Relative: 0 %
EOS ABS: 0.3 10*3/uL (ref 0.0–0.5)
EOS PCT: 3 %
HCT: 28.8 % — ABNORMAL LOW (ref 36.0–46.0)
Hemoglobin: 9.1 g/dL — ABNORMAL LOW (ref 12.0–15.0)
Immature Granulocytes: 0 %
LYMPHS ABS: 3.1 10*3/uL (ref 0.7–4.0)
Lymphocytes Relative: 30 %
MCH: 26.9 pg (ref 26.0–34.0)
MCHC: 31.6 g/dL (ref 30.0–36.0)
MCV: 85.2 fL (ref 80.0–100.0)
Monocytes Absolute: 0.7 10*3/uL (ref 0.1–1.0)
Monocytes Relative: 7 %
NRBC: 0 % (ref 0.0–0.2)
Neutro Abs: 6.2 10*3/uL (ref 1.7–7.7)
Neutrophils Relative %: 60 %
Platelets: 278 10*3/uL (ref 150–400)
RBC: 3.38 MIL/uL — ABNORMAL LOW (ref 3.87–5.11)
RDW: 14 % (ref 11.5–15.5)
WBC: 10.4 10*3/uL (ref 4.0–10.5)

## 2018-05-03 LAB — COMPREHENSIVE METABOLIC PANEL
ALT: 15 U/L (ref 0–44)
ANION GAP: 8 (ref 5–15)
AST: 23 U/L (ref 15–41)
Albumin: 2.3 g/dL — ABNORMAL LOW (ref 3.5–5.0)
Alkaline Phosphatase: 69 U/L (ref 38–126)
BILIRUBIN TOTAL: 0.4 mg/dL (ref 0.3–1.2)
BUN: 23 mg/dL — ABNORMAL HIGH (ref 6–20)
CO2: 22 mmol/L (ref 22–32)
Calcium: 8.2 mg/dL — ABNORMAL LOW (ref 8.9–10.3)
Chloride: 106 mmol/L (ref 98–111)
Creatinine, Ser: 1.82 mg/dL — ABNORMAL HIGH (ref 0.44–1.00)
GFR, EST AFRICAN AMERICAN: 40 mL/min — AB (ref >60)
GFR, EST NON AFRICAN AMERICAN: 34 mL/min — AB (ref >60)
Glucose, Bld: 276 mg/dL — ABNORMAL HIGH (ref 70–99)
POTASSIUM: 4.1 mmol/L (ref 3.5–5.1)
Sodium: 136 mmol/L (ref 135–145)
TOTAL PROTEIN: 6.2 g/dL — AB (ref 6.5–8.1)

## 2018-05-03 LAB — URINALYSIS, ROUTINE W REFLEX MICROSCOPIC
BILIRUBIN URINE: NEGATIVE
Glucose, UA: >=500 mg/dL — AB
Ketones, ur: NEGATIVE mg/dL
Leukocytes, UA: NEGATIVE
NITRITE: NEGATIVE
PH: 7 (ref 5.0–8.0)
Protein, ur: >300 mg/dL — AB
SPECIFIC GRAVITY, URINE: 1.02 (ref 1.005–1.030)

## 2018-05-03 LAB — URINALYSIS, MICROSCOPIC (REFLEX)

## 2018-05-03 LAB — BRAIN NATRIURETIC PEPTIDE: B Natriuretic Peptide: 61.4 pg/mL (ref 0.0–100.0)

## 2018-05-03 LAB — GLUCOSE, CAPILLARY: Glucose-Capillary: 196 mg/dL — ABNORMAL HIGH (ref 70–99)

## 2018-05-03 LAB — TROPONIN I
TROPONIN I: 0.05 ng/mL — AB (ref <0.03)
TROPONIN I: 0.05 ng/mL — AB (ref <0.03)

## 2018-05-03 MED ORDER — PROPRANOLOL HCL 20 MG PO TABS
20.0000 mg | ORAL_TABLET | Freq: Once | ORAL | Status: DC
  Filled 2018-05-03: qty 1

## 2018-05-03 MED ORDER — LABETALOL HCL 5 MG/ML IV SOLN
20.0000 mg | Freq: Once | INTRAVENOUS | Status: AC
  Administered 2018-05-03: 20 mg via INTRAVENOUS
  Filled 2018-05-03: qty 4

## 2018-05-03 MED ORDER — ACETAMINOPHEN 500 MG PO TABS
1000.0000 mg | ORAL_TABLET | Freq: Once | ORAL | Status: AC
Start: 2018-05-03 — End: 2018-05-03
  Administered 2018-05-03: 1000 mg via ORAL
  Filled 2018-05-03: qty 2

## 2018-05-03 MED ORDER — FENTANYL CITRATE (PF) 100 MCG/2ML IJ SOLN
50.0000 ug | Freq: Once | INTRAMUSCULAR | Status: AC
  Administered 2018-05-03: 50 ug via INTRAVENOUS
  Filled 2018-05-03: qty 2

## 2018-05-03 MED ORDER — ACETAMINOPHEN 325 MG PO TABS
650.0000 mg | ORAL_TABLET | Freq: Four times a day (QID) | ORAL | Status: DC | PRN
  Administered 2018-05-04 – 2018-05-05 (×3): 650 mg via ORAL
  Filled 2018-05-03 (×3): qty 2

## 2018-05-03 MED ORDER — MORPHINE SULFATE (PF) 2 MG/ML IV SOLN
2.0000 mg | INTRAVENOUS | Status: DC | PRN
  Administered 2018-05-03 – 2018-05-05 (×5): 2 mg via INTRAVENOUS
  Filled 2018-05-03 (×5): qty 1

## 2018-05-03 NOTE — ED Provider Notes (Signed)
MSE was initiated and I personally evaluated the patient and placed orders (if any) at  1:57 PM on May 03, 2018.  Patient with a couple days of headache and left facial paresthesias.  Has a history of hypertension does not usually get headaches with them.  She went to see her surgeon and urgent care and primary care and ended up here to be seen.  Has no other neurologic concerns.  Has lower extremity edema but this is normal for her she usually takes a Bumex when it happens. Limited physical examination patient has 1+ pitting edema bilaterally to lower extremities.  Possibly slightly decreased strength in the left however changes dependent on exam maneuver.  No facial abnormalities.  No obvious S3, JVD or wheezing. I have low suspicion that the patient is having an acute stroke and if she is she is far outside the window of any possible intervention.  We will put in preliminary orders and medications for her symptoms but is stable to be seen by another provider when a room opens.  The patient appears stable so that the remainder of the MSE may be completed by another provider.    Merrily Pew, MD 05/03/18 1359

## 2018-05-03 NOTE — ED Provider Notes (Signed)
[facility] Provider Note   CSN: [id number] Arrival date & time: 05/03/18  1305     History   Chief Complaint Chief Complaint  Patient presents with  . Hypertension    HPI Jacqueline [name] is a 37 y.o. female past medical history of anemia, anxiety, hypertension, morbid obesity, diabetes who presents for evaluation of hypertension, left-sided facial numbness.  Patient states that she was being evaluated at [facility] bariatric clinic for possible bariatric surgery and they noted her to be hypertensive.  Patient states that she was advised to go to her primary care doctor who gave her medications and evaluated her.  She states that her blood pressure remained elevated, prompting ED visit.  She states that since yesterday, she has had some left-sided sensation changes that she describes as "tingling sensation."  The tingling sensation initially was intermittent but states that since this morning, has been constant.  She states that it starts at about the cheek and extends distally.  Noted decreased sensation to forehead.  She also reports that she has had a headache since yesterday.  She states that headache was gradual in nature and denies any thunderclap headache.  No preceding trauma, injury, fall.  She took Tylenol Motrin with no improvement in her headache.  He does not have a history of migraines.  Patient also reports she had one episode of vomiting yesterday.  She also states that she has had some intermittent chest pain and shortness of breath that was worse with exertion since yesterday.  She denies any current chest pain at this time.  Patient states that she does have a history of hypertension and reports she has been taking her meds as directed.  Patient denies any fevers, cough, vision changes, speech difficulty, numbness/weakness of her arms or legs.  The history is provided by the patient.    Past Medical History:  Diagnosis Date  . Anemia   . Anxiety     . Chronic bronchitis ([place])   . Hypertension   . Increased frequency of headaches   . Morbid obesity ([place])   . Sleep apnea   . Type II diabetes mellitus [facility])     Patient Active Problem List   Diagnosis Date Noted  . Hypertensive urgency 05/03/2018  . Left facial numbness 05/03/2018  . Headache 05/03/2018  . Elevated troponin 05/03/2018  . Atrial fibrillation, permanent 05/25/2017  . Sepsis ([place]) 05/03/2017    Class: Present on Admission  . Acute renal failure superimposed on stage 3 chronic kidney disease ([place]) 05/03/2017    Class: Stage 3  . Neck pain   . Acidosis, metabolic   . Hyponatremia   . Type II diabetes mellitus with renal manifestations ([place]) 04/30/2017  . Anemia 04/30/2017  . Staphylococcus aureus bacteremia 03/15/2016  . Streptococcal bacteremia 03/15/2016  . Tachycardia 03/14/2016  . Fever 03/14/2016  . Bad headache 03/14/2016  . Morbid obesity ([place])   . HTN (hypertension) 12/22/2015  . Diabetes ([place]) 01/01/2014    Past Surgical History:  Procedure Laterality Date  . BREAST REDUCTION SURGERY  03/21/2017  . CARDIAC CATHETERIZATION  01/06/2016  . CARDIAC CATHETERIZATION N/A 01/06/2016   Procedure: Left Heart Cath and Coronary Angiography;  Surgeon: Wellington Hampshire, MD;  Location: [facility];  Service: Cardiovascular;  Laterality: N/A;  . CESAREAN SECTION  08/2014     OB History    Gravida  1   Para      Term      Preterm  AB      Living        SAB      TAB      Ectopic      Multiple      Live Births               Home Medications    Prior to Admission medications   Medication Sig Start Date End Date Taking? Authorizing Provider  amLODipine (NORVASC) 5 MG tablet Take 1 tablet (5 mg total) daily by mouth. 05/25/17 08/23/17  Minus Breeding, MD  benzonatate (TESSALON) 100 MG capsule Take 1 capsule (100 mg total) every 8 (eight) hours by mouth. 05/23/17   Long, Wonda Olds, MD  bumetanide (BUMEX) 1 MG tablet Take 1 mg by  mouth daily.    [provider]  insulin aspart (NOVOLOG) 100 UNIT/ML injection Inject 5 Units into the skin 3 (three) times daily with meals. 05/02/17   Donne Hazel, MD  Insulin Glargine (LANTUS) 100 UNIT/ML Solostar Pen Inject 20 Units into the skin daily at 10 pm. 05/02/17   Donne Hazel, MD  metFORMIN (GLUCOPHAGE) 500 MG tablet Take by mouth 2 (two) times daily with a meal.    [provider]  olmesartan (BENICAR) 5 MG tablet Take daily by mouth.    [provider]  ondansetron (ZOFRAN ODT) 4 MG disintegrating tablet Take 1 tablet (4 mg total) every 8 (eight) hours as needed by mouth for nausea or vomiting. 05/23/17   Long, Wonda Olds, MD  propranolol (INDERAL) 20 MG tablet Take 1 tablet (20 mg total) by mouth 2 (two) times daily. 06/13/17   Sherwood Gambler, MD    Family History Family History  Problem Relation Age of Onset  . Diabetes Mother   . Hypertension Mother   . Thyroid disease Mother   . Kidney disease Maternal Grandmother   . Diabetes Maternal Grandmother   . Heart attack Unknown     Social History Social History   Tobacco Use  . Smoking status: Never Smoker  . Smokeless tobacco: Never Used  Substance Use Topics  . Alcohol use: No  . Drug use: No     Allergies   Patient has no known allergies.   Review of Systems Review of Systems  Constitutional: Negative for fever.  Respiratory: Positive for shortness of breath. Negative for cough.   Cardiovascular: Positive for chest pain.  Gastrointestinal: Positive for vomiting. Negative for abdominal pain and nausea.  Genitourinary: Negative for dysuria and hematuria.  Neurological: Positive for numbness and headaches. Negative for weakness.  All other systems reviewed and are negative.    Physical Exam Updated Vital Signs BP (!) 171/94 (BP Location: Right Wrist)   Pulse (!) 105   Temp 98.2 F (36.8 C) (Oral)   Resp 16   Ht 5\' 5"  (1.651 m)   Wt (!) 139.1 kg   LMP 04/17/2018    SpO2 98%   BMI 51.03 kg/m   Physical Exam  Constitutional: She is oriented to person, place, and time. She appears well-developed and well-nourished.  HENT:  Head: Normocephalic and atraumatic.  Mouth/Throat: Oropharynx is clear and moist and mucous membranes are normal.  Eyes: Pupils are equal, round, and reactive to light. Conjunctivae and lids are normal. Right eye exhibits nystagmus. Left eye exhibits nystagmus.  Is on no nystagmus noted to the left.  Neck: Full passive range of motion without pain.  Cardiovascular: Normal rate, regular rhythm and normal heart sounds. Exam reveals no gallop  and no friction rub.  No murmur heard. Pulses:      Radial pulses are 2+ on the right side, and 1+ on the left side.  Pulmonary/Chest: Effort normal and breath sounds normal.  Lungs clear to auscultation bilaterally.  Symmetric chest rise.  No wheezing, rales, rhonchi.  Abdominal: Soft. Normal appearance. There is no tenderness. There is no rigidity and no guarding.  Abdomen is soft, non-distended, non-tender. No rigidity, No guarding. No peritoneal signs.  Musculoskeletal: Normal range of motion.  1+ pitting edema noted to bilateral lower extremities.  No overlying warmth, erythema.   Neurological: She is alert and oriented to person, place, and time.  Cranial nerves III-XII intact Follows commands, Moves all extremities  5/5 strength to BUE and BLE  Objective decreased sensation noted from the mid cheek distally on the left side of face.  No sensation difference noted to forehead.  Patient able to tell sharp versus dull on all aspects of the face. No pronator drift. No gait abnormalities  No slurred speech. No facial droop.   Skin: Skin is warm and dry. Capillary refill takes less than 2 seconds.  Psychiatric: She has a normal mood and affect. Her speech is normal.  Nursing note and vitals reviewed.    ED Treatments / Results  Labs (all labs ordered are listed, but only abnormal results  are displayed) Labs Reviewed  CBC WITH DIFFERENTIAL/PLATELET - Abnormal; Notable for the following components:      Result Value   RBC 3.38 (*)    Hemoglobin 9.1 (*)    HCT 28.8 (*)    All other components within normal limits  COMPREHENSIVE METABOLIC PANEL - Abnormal; Notable for the following components:   Glucose, Bld 276 (*)    BUN 23 (*)    Creatinine, Ser 1.82 (*)    Calcium 8.2 (*)    Total Protein 6.2 (*)    Albumin 2.3 (*)    GFR calc non Af Amer 34 (*)    GFR calc Af Amer 40 (*)    All other components within normal limits  TROPONIN I - Abnormal; Notable for the following components:   Troponin I 0.05 (*)    All other components within normal limits  URINALYSIS, ROUTINE W REFLEX MICROSCOPIC - Abnormal; Notable for the following components:   APPearance CLOUDY (*)    Glucose, UA >=500 (*)    Hgb urine dipstick MODERATE (*)    Protein, ur >300 (*)    All other components within normal limits  URINALYSIS, MICROSCOPIC (REFLEX) - Abnormal; Notable for the following components:   Bacteria, UA MANY (*)    All other components within normal limits  TROPONIN I - Abnormal; Notable for the following components:   Troponin I 0.05 (*)    All other components within normal limits  GLUCOSE, CAPILLARY - Abnormal; Notable for the following components:   Glucose-Capillary 196 (*)    All other components within normal limits  BRAIN NATRIURETIC PEPTIDE    EKG EKG Interpretation  Date/Time:  Thursday May 03 2018 15:53:00 EDT Ventricular Rate:  110 PR Interval:    QRS Duration: 74 QT Interval:  340 QTC Calculation: 460 R Axis:   110 Text Interpretation:  Sinus tachycardia Borderline right axis deviation Low voltage, precordial leads Nonspecific T abnormalities, lateral leads since last tracing no significant change Confirmed by Malvin Johns [phone]) on 05/03/2018 3:56:50 PM   Radiology Dg Chest 2 View  Result Date: 05/03/2018 CLINICAL DATA:  Lower extremity edema  EXAM: CHEST - 2 VIEW COMPARISON:  01/31/2017 FINDINGS: There is no focal parenchymal opacity. There is no pleural effusion or pneumothorax. There is stable cardiomegaly. The osseous structures are unremarkable. IMPRESSION: No active cardiopulmonary disease. Electronically Signed   By: Kathreen Devoid   On: 05/03/2018 14:36   Ct Head Wo Contrast  Result Date: 05/03/2018 CLINICAL DATA:  Headache hypertension.  Left facial numbness EXAM: CT HEAD WITHOUT CONTRAST TECHNIQUE: Contiguous axial images were obtained from the base of the skull through the vertex without intravenous contrast. COMPARISON:  MRI head 05/01/2017 FINDINGS: Brain: No evidence of acute infarction, hemorrhage, hydrocephalus, extra-axial collection or mass lesion/mass effect. Vascular: Negative for hyperdense vessel Skull: Negative Sinuses/Orbits: Negative Other: None IMPRESSION: Negative CT head Electronically Signed   By: Franchot Gallo M.D.   On: 05/03/2018 14:28    Procedures Procedures (including critical care time)  Medications Ordered in ED Medications  propranolol (INDERAL) tablet 20 mg (has no administration in time range)  acetaminophen (TYLENOL) tablet 650 mg (has no administration in time range)  morphine 2 MG/ML injection 2 mg (2 mg Intravenous Given 05/03/18 2314)  acetaminophen (TYLENOL) tablet 1,000 mg (1,000 mg Oral Given 05/03/18 1427)  labetalol (NORMODYNE,TRANDATE) injection 20 mg (20 mg Intravenous Given 05/03/18 1642)  fentaNYL (SUBLIMAZE) injection 50 mcg (50 mcg Intravenous Given 05/03/18 1712)     Initial Impression / Assessment and Plan / ED Course  I have reviewed the triage vital signs and the nursing notes.  Pertinent labs & imaging results that were available during my care of the patient were reviewed by me and considered in my medical decision making (see chart for details).     37 year old female past history of diabetes, hypertension, morbid obesity presents for evaluation of hypertension,  left-sided facial numbness.  Patient reports that she has had left-sided facial numbness that began yesterday.  Initially on and off but now more consistently.  Also reports a vague history of chest pain and dyspnea on exertion has been ongoing for the last 2 to 3 days.  No current chest pain at this time.  Patient reports she has been compliant with her blood pressure medication. Patient is afebrile, non-toxic appearing, sitting comfortably on examination table.  No signs reviewed.  Patient is hypertensive with systolic in the 326Z.  Patient with subjective numbness to the left side of face that begins at the cheek and extends distally.  No other neuro deficits noted.  Consider hypertensive emergency versus ACS etiology.  Also consider CVA etiology given sensory deficits.  Initial labs imaging ordered at triage.  Give labetalol for management of patient's blood pressure.  Patient had cardiac cath in 2017 that showed normal coronary arteries.  CMP shows blood glucose of 276, BUN of 23, creatinine of 1.82.  Most recent blood work was approximately 2 months ago which showed a BUN of 23, creatinine 1.58.  CBC shows no leukocytosis.  Hemoglobin is stable at 9.1 which is consistent with patient's baseline.  Proteinuria, moderate hemoglobin.  It is consistent with patient's baseline.  BNP is unremarkable. Troponin is 0.05.  CT head negative for any acute intracranial abnormality.  Chest x-ray negative for any acute abnormality.  Given subjective sensory deficits on the left side of face, will most likely need MRI for evaluation and rule out CVA.  Additionally, given elevation in troponin, will need admission for further evaluation.  Discussed with hospitalist.  Will accept patient for admission.  Discussed with Dr. Cheral Marker.  He requests that hospitalist consult him  if MRI is abnormal.  He is aware patient.  Final Clinical Impressions(s) / ED Diagnoses   Final diagnoses:  Elevated troponin  Essential  hypertension  Paresthesia    ED Discharge Orders    None       Volanda Napoleon, PA-C 05/04/18 [id number]    Malvin Johns, MD 05/04/18 2011

## 2018-05-03 NOTE — H&P (Signed)
History and Physical    Jacqueline [name] XIP:[id number] DOB: 1981-05-02 DOA: 05/03/2018  Referring MD/NP/PA:   PCP: Audley Hose, MD   Patient coming from:  The patient is coming from home.  At baseline, pt is independent for most of ADL.   Chief Complaint: Headache, left facial numbness, chest pain  HPI: Jacqueline [name] is a 37 y.o. female with medical history significant of hypertension, diabetes mellitus, anxiety, anemia, OSA on CPAP, morbid obesity, CKD 3, atrial fibrillation not on anticoagulants, who presents with headache, left facial numbness, chest pain.  Patient states that she has been having headache, left facial numbness in the past 3 days.  The headache is located in occipital area, constant, 8 out of 10 severity initially, currently headache has resolved.  At the same time she has been having left facial numbness, mild blurry vision.  No unilateral weakness in extremities.  No facial droop, slurred speech or hearing loss.  She also reports that she has been having mild chest pain and shortness of breath in the past 3 days.  The chest pain is located in substernal area, mild, pressure-like, nonradiating.  Associated with shortness of breath, but no cough, fever or chills.  Denies symptoms of UTI.  Patient visited her bariatric surgeon today, was found to have elevated blood pressure and sent to ED for further evaluation treatment.  ED Course: pt was found to have WBC 10.4, urinalysis (cloudy appearance, no leukocytes, many bacteria, WBC 11-20), worsening renal function, temperature 99.1, blood pressure 198/109, tachycardia, RR 24, oxygen saturation 96% on room air, chest x-ray negative.  CT head is negative for acute intracranial abnormalities.  Patient is placed on telemetry bed for observation. EDP discussed with Dr. Cheral Marker.  He requests that hospitalist consult him if MRI is abnormal.  MRI of brain was done, which is negative.  Patient is placed on telemetry bed for  observation.   Review of Systems:   General: no fevers, chills, no body weight gain, has fatigue and HA. HEENT: no blurry vision, hearing changes or sore throat Respiratory: has SOB, no coughing, wheezing CV: has chest pain, no palpitations GI: no nausea, vomiting, abdominal pain, diarrhea, constipation GU: no dysuria, burning on urination, increased urinary frequency, hematuria  Ext: no leg edema Neuro: has left facial numbness and blurry vision. Skin: no rash, no skin tear. MSK: No muscle spasm, no deformity, no limitation of range of movement in spin Heme: No easy bruising.  Travel history: No recent long distant travel.  Allergy: No Known Allergies  Past Medical History:  Diagnosis Date  . Anemia   . Anxiety   . Chronic bronchitis ([place])   . Hypertension   . Increased frequency of headaches   . Morbid obesity ([place])   . Sleep apnea   . Type II diabetes mellitus ([place])     Past Surgical History:  Procedure Laterality Date  . BREAST REDUCTION SURGERY  03/21/2017  . CARDIAC CATHETERIZATION  01/06/2016  . CARDIAC CATHETERIZATION N/A 01/06/2016   Procedure: Left Heart Cath and Coronary Angiography;  Surgeon: Wellington Hampshire, MD;  Location: [facility];  Service: Cardiovascular;  Laterality: N/A;  . CESAREAN SECTION  08/2014    Social History:  reports that she has never smoked. She has never used smokeless tobacco. She reports that she does not drink alcohol or use drugs.  Family History:  Family History  Problem Relation Age of Onset  . Diabetes Mother   . Hypertension Mother   . Thyroid  disease Mother   . Kidney disease Maternal Grandmother   . Diabetes Maternal Grandmother   . Heart attack Unknown      Prior to Admission medications   Medication Sig Start Date End Date Taking? Authorizing Provider  amLODipine (NORVASC) 5 MG tablet Take 1 tablet (5 mg total) daily by mouth. 05/25/17 08/23/17  Minus Breeding, MD  benzonatate (TESSALON) 100 MG capsule Take 1  capsule (100 mg total) every 8 (eight) hours by mouth. 05/23/17   Long, Wonda Olds, MD  bumetanide (BUMEX) 1 MG tablet Take 1 mg by mouth daily.    [provider]  insulin aspart (NOVOLOG) 100 UNIT/ML injection Inject 5 Units into the skin 3 (three) times daily with meals. 05/02/17   Donne Hazel, MD  Insulin Glargine (LANTUS) 100 UNIT/ML Solostar Pen Inject 20 Units into the skin daily at 10 pm. 05/02/17   Donne Hazel, MD  metFORMIN (GLUCOPHAGE) 500 MG tablet Take by mouth 2 (two) times daily with a meal.    [provider]  olmesartan (BENICAR) 5 MG tablet Take daily by mouth.    [provider]  ondansetron (ZOFRAN ODT) 4 MG disintegrating tablet Take 1 tablet (4 mg total) every 8 (eight) hours as needed by mouth for nausea or vomiting. 05/23/17   Long, Wonda Olds, MD  propranolol (INDERAL) 20 MG tablet Take 1 tablet (20 mg total) by mouth 2 (two) times daily. 06/13/17   Sherwood Gambler, MD    Physical Exam: Vitals:   05/03/18 1900 05/03/18 2009 05/03/18 2326 05/04/18 0533  BP: (!) 160/88 (!) 189/100 (!) 171/94 (!) 162/98  Pulse: 98 99 (!) 105 88  Resp: _0 Temp:  98.5 F (36.9 C) 98.2 F (36.8 C) 98.2 F (36.8 C)  TempSrc:  Oral Oral Oral  SpO2: 96% 100% 98% 97%  Weight:  (!) 139.1 kg    Height:  _1  (1.651 m)     General: Not in acute distress HEENT:       Eyes: PERRL, EOMI, no scleral icterus.       ENT: No discharge from the ears and nose, no pharynx injection, no tonsillar enlargement.        Neck: No JVD, no bruit, no mass felt. Heme: No neck lymph node enlargement. Cardiac: S1/S2, RRR, No murmurs, No gallops or rubs. Respiratory:  No rales, wheezing, rhonchi or rubs. GI: Soft, nondistended, nontender, no rebound pain, no organomegaly, BS present. GU: No hematuria Ext: No pitting leg edema bilaterally. 2+DP/PT pulse bilaterally. Musculoskeletal: No joint deformities, No joint redness or warmth, no limitation of ROM in spin. Skin:  No rashes.  Neuro: Alert, oriented X3, cranial nerves II-XII grossly intact, moves all extremities normally. Muscle strength 5/5 in all extremities, sensation to light touch intact. Brachial reflex 2+ bilaterally. Negative Babinski's sign. Normal finger to nose test. Psych: Patient is not psychotic, no suicidal or hemocidal ideation.  Labs on Admission: I have personally reviewed following labs and imaging studies  CBC: Recent Labs  Lab 05/03/18 1441  WBC 10.4  NEUTROABS 6.2  HGB 9.1*  HCT 28.8*  MCV 85.2  PLT 096   Basic Metabolic Panel: Recent Labs  Lab 05/03/18 1441  NA 136  K 4.1  CL 106  CO2 22  GLUCOSE 276*  BUN 23*  CREATININE 1.82*  CALCIUM 8.2*   GFR: Estimated Creatinine Clearance: 59.4 mL/min (A) (by C-G formula based on SCr of 1.82 mg/dL (H)). Liver Function Tests: Recent Labs  Lab 05/03/18 1441  AST 23  ALT 15  ALKPHOS 69  BILITOT 0.4  PROT 6.2*  ALBUMIN 2.3*   No results for input(s): LIPASE, AMYLASE in the last 168 hours. No results for input(s): AMMONIA in the last 168 hours. Coagulation Profile: No results for input(s): INR, PROTIME in the last 168 hours. Cardiac Enzymes: Recent Labs  Lab 05/03/18 1358 05/03/18 1834 05/04/18 0217  TROPONINI 0.05* 0.05* 0.03*   BNP (last 3 results) No results for input(s): PROBNP in the last 8760 hours. HbA1C: Recent Labs    05/04/18 0217  HGBA1C 10.2*   CBG: Recent Labs  Lab 05/03/18 2127  GLUCAP 196*   Lipid Profile: Recent Labs    05/04/18 0217  CHOL 134  HDL 33*  LDLCALC 68  TRIG 166*  CHOLHDL 4.1   Thyroid Function Tests: No results for input(s): TSH, T4TOTAL, FREET4, T3FREE, THYROIDAB in the last 72 hours. Anemia Panel: No results for input(s): VITAMINB12, FOLATE, FERRITIN, TIBC, IRON, RETICCTPCT in the last 72 hours. Urine analysis:    Component Value Date/Time   COLORURINE YELLOW 05/03/2018 1358   APPEARANCEUR CLOUDY (A) 05/03/2018 1358   LABSPEC 1.020 05/03/2018 1358    PHURINE 7.0 05/03/2018 1358   GLUCOSEU >=500 (A) 05/03/2018 1358   HGBUR MODERATE (A) 05/03/2018 1358   BILIRUBINUR NEGATIVE 05/03/2018 1358   KETONESUR NEGATIVE 05/03/2018 1358   PROTEINUR >300 (A) 05/03/2018 1358   UROBILINOGEN 0.2 08/21/2012 2130   NITRITE NEGATIVE 05/03/2018 1358   LEUKOCYTESUR NEGATIVE 05/03/2018 1358   Sepsis Labs: _0 (procalcitonin:4,lacticidven:4) )No results found for this or any previous visit (from the past 240 hour(s)).   Radiological Exams on Admission: Dg Chest 2 View  Result Date: 05/03/2018 CLINICAL DATA:  Lower extremity edema EXAM: CHEST - 2 VIEW COMPARISON:  01/31/2017 FINDINGS: There is no focal parenchymal opacity. There is no pleural effusion or pneumothorax. There is stable cardiomegaly. The osseous structures are unremarkable. IMPRESSION: No active cardiopulmonary disease. Electronically Signed   By: Kathreen Devoid   On: 05/03/2018 14:36   Ct Head Wo Contrast  Result Date: 05/03/2018 CLINICAL DATA:  Headache hypertension.  Left facial numbness EXAM: CT HEAD WITHOUT CONTRAST TECHNIQUE: Contiguous axial images were obtained from the base of the skull through the vertex without intravenous contrast. COMPARISON:  MRI head 05/01/2017 FINDINGS: Brain: No evidence of acute infarction, hemorrhage, hydrocephalus, extra-axial collection or mass lesion/mass effect. Vascular: Negative for hyperdense vessel Skull: Negative Sinuses/Orbits: Negative Other: None IMPRESSION: Negative CT head Electronically Signed   By: Franchot Gallo M.D.   On: 05/03/2018 14:28   Mr Brain Wo Contrast  Result Date: 05/04/2018 CLINICAL DATA:  37 y/o F; 1 day history of headache and left facial tingling common tear mid chest pain, dyspnea on exertion, hypertension. EXAM: MRI HEAD WITHOUT CONTRAST TECHNIQUE: Multiplanar, multiecho pulse sequences of the brain and surrounding structures were obtained without intravenous contrast. COMPARISON:  05/01/2017 MRI head.  05/03/2018 CT  head. FINDINGS: Brain: No acute infarction, hemorrhage, hydrocephalus, extra-axial collection or mass lesion. Vascular: Normal flow voids. Skull and upper cervical spine: Normal marrow signal. Sinuses/Orbits: Small mucous retention cyst within the right maxillary sinus antrum. Paranasal sinuses are otherwise normal. No abnormal signal of the mastoid air cells. Orbits are unremarkable. Other: None. IMPRESSION: No acute intracranial abnormality identified. Stable unremarkable MRI of the brain. Electronically Signed   By: Kristine Garbe M.D.   On: 05/04/2018 04:08     EKG: Independently reviewed.  Sinus rhythm, QTC 460, RAD, low voltage, T wave  inversion in V5-V6.  Assessment/Plan Principal Problem:   Left facial numbness Active Problems:   HTN (hypertension)   Morbid obesity (HCC)   Type II diabetes mellitus with renal manifestations (HCC)   Anemia   Acute renal failure superimposed on stage 3 chronic kidney disease (HCC)   Hypertensive urgency   Headache   Elevated troponin   PAF (paroxysmal atrial fibrillation) (H[facility])   Left facial numbness: Etiology is not clear. EDP discussed with Dr. Cheral Marker.  He requests that hospitalist consult him if MRI is abnormal.  MRI of brain was done, which is negative.  - will place on tele bed for obs - check TSH and Vitamin B12 level - ASA  - fasting lipid panel and HbA1c  - swallowing screen - Check UDS   Chest pain and elevated trop: trop 0.05, may be due to demand ischemia secondary to elevated blood pressure. - cycle CE q6 x3 and repeat EKG in the am  - prn Nitroglycerin, Morphine, and aspirin - start lipitor 80 mg daily - Risk factor stratification: will check FLP, UDS and A1C  - 2d echo -card consult was requested via [facility]  Essential hypertension and Hypertensive urgency: Blood pressure is elevated, 198/109. -IV Hydralazine prn -hold Benicar due to worsening renal function -Increase amlodipine dose from 5 to 10 mg daily -Continue  propranolol  Morbid obesity [facility]): Patient follow-ups with bariatric surgery -She is scheduled for Roux-en-Y procedure on November 13 -Diet and exercise.    Type II diabetes mellitus with renal manifestations ([place]): Last A1c 10.8 on 05/01/17, poorly controled. Patient is taking metformin, NovoLog and glargine insulin at home -will decrease glargine insulin dose from 20 to 14 units daily -SSI  Anemia: Hemoglobin stable.  Hemoglobin 9.7 on 01/27/2018--> 9.1 today -Follow-up by CBC  AoCKD-III: Baseline Cre is ~1.3, pt's Cre is 1.82 and BUN 23  on admission. Likely due to dehydration and continuation of ARB - IVF: NS 75  Cc/h - Follow up renal function by BMP - Hold Benicar  Headache: Likely due to elevated blood pressure. - Check ESR and CRP to rule out temporal arteritis  Hx of PAF (paroxysmal atrial fibrillation): CHA2DS2-VASc Score is 2, needs oral anticoagulatio, but pt is not on AC at home, not sure why. Heart rate is 90-110. Currently sinus rhythm. -f/u card recommendations regarding AC use -continue propranolol  Abnormal UA: UA showed cloudy appearance, no leukocytes, many bacteria, WBC 11-20, but pt denies symptoms with UTI. -Hold off antibiotics -Follow-up blood culture and urine culture      DVT ppx:  SQ Lovenox Code Status: Full code Family Communication: None at bed side.    Disposition Plan:  Anticipate discharge back to previous home environment Consults called:  none Admission status: Obs / tele      Date of Service 05/04/2018    Ivor Costa [facility] Pager [phone]  If 7PM-7AM, please contact night-coverage [email or web address] Password [facility] 05/04/2018, 6:23 AM

## 2018-05-03 NOTE — ED Notes (Signed)
MD notified that Propanolol tab is not available.

## 2018-05-03 NOTE — ED Notes (Signed)
Mesner, MD notified about pt.

## 2018-05-03 NOTE — ED Notes (Signed)
Troponin 0.05, given to ED MD 

## 2018-05-03 NOTE — ED Triage Notes (Addendum)
Pt states she saw her bariatric surgeon this AM who sent her to her PCP for HTN. Her PCP sent her here and pt states he told her that her B/P was elevated and that she could possibly be having a stroke. Pt reports only symptom as tingling L side of face and an intermittent HA. Denies numbness. Symptoms started yesterday. BEFAST exam is negative.

## 2018-05-03 NOTE — Progress Notes (Signed)
Acceptance note for transfer and hospital admission.  I received a call requesting transfer of this patient from [place] ED to [facility] for further evaluation and management.  I discussed with Providence Lanius, PA-C at [facility] ED and summary as below  37 year old female with PMH of DM 2/IDDM, HTN, morbid obesity, sleep apnea, anxiety, anemia initially evaluated at bariatric clinic where noted to be hypertensive, then advised to see her PCP where noted to have persistent hypertension and advised to come to ED.  She presented with 1 day history of headache and left facial tingling and couple of days history of intermittent chest pain and dyspnea with exertion.  No current chest pain.  In ED, temperature 99.1, RR 24-27, pulse 108, BP as high as 198/109.  As per EDP, not in any distress, lower extremity edema present, alert and oriented without focal neurological deficits.  Lab work significant for glucose 276, creatinine 1.82 (1.5), hemoglobin 9.1, troponin 0.05, negative CT head and chest x-ray.  EKG shows sinus tachycardia at 110 bpm, nonspecific T wave changes and no acute findings.  Although urine microscopy shows bacteria and pyuria, reportedly no UTI symptoms.  Assessment and plan:  1. Hypertensive urgency: Need to optimize blood pressure control 2. Left facial paresthesia: Unclear etiology.  EDP has paged Neuro Hospitalist to discuss and awaiting callback.?  Related to uncontrolled hypertension versus rule out TIA/CVA.  Consider formal Neurology consultation. 3. Exertional chest pain and dyspnea/elevated troponin: She has had similar complaints in the past and has been evaluated by Cardiology.  Troponin elevation possibly due to demand ischemia related to uncontrolled hypertension and acute on chronic kidney disease.  Cardiac cath 01/06/2016: Normal coronary arteries.  Consider cycling troponin and further evaluation as deemed necessary. 4. Acute on stage III chronic kidney  disease: Baseline creatinine may be around 1.3-1.5.  Presented with creatinine of 1.8. 5. Uncontrolled DM 2  Accepted patient transfer to [facility] to a telemetry bed under observation status.  Patient will need MD evaluation, admission orders and H&P on arrival.  Vernell Leep, MD, [place], [facility]. [facility] Pager [phone]  If 7PM-7AM, please contact night-coverage [email or web address] Password [email or web address] 05/03/2018, 5:17 PM

## 2018-05-04 ENCOUNTER — Other Ambulatory Visit: Payer: Self-pay

## 2018-05-04 ENCOUNTER — Observation Stay (HOSPITAL_COMMUNITY): Payer: BLUE CROSS/BLUE SHIELD

## 2018-05-04 ENCOUNTER — Observation Stay (HOSPITAL_BASED_OUTPATIENT_CLINIC_OR_DEPARTMENT_OTHER): Payer: BLUE CROSS/BLUE SHIELD

## 2018-05-04 DIAGNOSIS — R202 Paresthesia of skin: Secondary | ICD-10-CM | POA: Diagnosis not present

## 2018-05-04 DIAGNOSIS — R0789 Other chest pain: Secondary | ICD-10-CM | POA: Diagnosis not present

## 2018-05-04 DIAGNOSIS — R51 Headache: Secondary | ICD-10-CM | POA: Diagnosis not present

## 2018-05-04 DIAGNOSIS — R7989 Other specified abnormal findings of blood chemistry: Secondary | ICD-10-CM | POA: Diagnosis not present

## 2018-05-04 DIAGNOSIS — R079 Chest pain, unspecified: Secondary | ICD-10-CM | POA: Diagnosis not present

## 2018-05-04 DIAGNOSIS — N179 Acute kidney failure, unspecified: Secondary | ICD-10-CM | POA: Diagnosis not present

## 2018-05-04 DIAGNOSIS — I160 Hypertensive urgency: Secondary | ICD-10-CM | POA: Diagnosis not present

## 2018-05-04 DIAGNOSIS — R200 Anesthesia of skin: Secondary | ICD-10-CM | POA: Diagnosis not present

## 2018-05-04 LAB — BASIC METABOLIC PANEL
ANION GAP: 6 (ref 5–15)
BUN: 17 mg/dL (ref 6–20)
CHLORIDE: 110 mmol/L (ref 98–111)
CO2: 24 mmol/L (ref 22–32)
Calcium: 8 mg/dL — ABNORMAL LOW (ref 8.9–10.3)
Creatinine, Ser: 1.74 mg/dL — ABNORMAL HIGH (ref 0.44–1.00)
GFR calc non Af Amer: 36 mL/min — ABNORMAL LOW (ref >60)
GFR, EST AFRICAN AMERICAN: 42 mL/min — AB (ref >60)
Glucose, Bld: 199 mg/dL — ABNORMAL HIGH (ref 70–99)
POTASSIUM: 3.9 mmol/L (ref 3.5–5.1)
SODIUM: 140 mmol/L (ref 135–145)

## 2018-05-04 LAB — ECHOCARDIOGRAM COMPLETE
Height: 65 in
WEIGHTICAEL: 4906.56 [oz_av]

## 2018-05-04 LAB — CBC
HEMATOCRIT: 27.4 % — AB (ref 36.0–46.0)
HEMOGLOBIN: 8.7 g/dL — AB (ref 12.0–15.0)
MCH: 26.7 pg (ref 26.0–34.0)
MCHC: 31.8 g/dL (ref 30.0–36.0)
MCV: 84 fL (ref 80.0–100.0)
Platelets: 349 10*3/uL (ref 150–400)
RBC: 3.26 MIL/uL — AB (ref 3.87–5.11)
RDW: 14 % (ref 11.5–15.5)
WBC: 9.4 10*3/uL (ref 4.0–10.5)
nRBC: 0 % (ref 0.0–0.2)

## 2018-05-04 LAB — RAPID URINE DRUG SCREEN, HOSP PERFORMED
AMPHETAMINES: NOT DETECTED
Barbiturates: NOT DETECTED
Benzodiazepines: NOT DETECTED
Cocaine: NOT DETECTED
OPIATES: POSITIVE — AB
Tetrahydrocannabinol: NOT DETECTED

## 2018-05-04 LAB — GLUCOSE, CAPILLARY
GLUCOSE-CAPILLARY: 177 mg/dL — AB (ref 70–99)
GLUCOSE-CAPILLARY: 223 mg/dL — AB (ref 70–99)
Glucose-Capillary: 194 mg/dL — ABNORMAL HIGH (ref 70–99)

## 2018-05-04 LAB — LIPID PANEL
CHOLESTEROL: 134 mg/dL (ref 0–200)
HDL: 33 mg/dL — ABNORMAL LOW (ref >40)
LDL Cholesterol: 68 mg/dL (ref 0–99)
TRIGLYCERIDES: 166 mg/dL — AB (ref <150)
Total CHOL/HDL Ratio: 4.1 RATIO
VLDL: 33 mg/dL (ref 0–40)

## 2018-05-04 LAB — HCG, QUANTITATIVE, PREGNANCY: hCG, Beta Chain, Quant, S: 1 m[IU]/mL (ref <5)

## 2018-05-04 LAB — TROPONIN I
TROPONIN I: 0.03 ng/mL — AB (ref <0.03)
TROPONIN I: 0.03 ng/mL — AB (ref <0.03)
Troponin I: 0.04 ng/mL (ref <0.03)

## 2018-05-04 LAB — TSH: TSH: 4.173 u[IU]/mL (ref 0.350–4.500)

## 2018-05-04 LAB — SEDIMENTATION RATE: Sed Rate: 93 mm/hr — ABNORMAL HIGH (ref 0–22)

## 2018-05-04 LAB — VITAMIN B12: Vitamin B-12: 398 pg/mL (ref 180–914)

## 2018-05-04 LAB — C-REACTIVE PROTEIN: CRP: 1.2 mg/dL — ABNORMAL HIGH (ref <1.0)

## 2018-05-04 LAB — HEMOGLOBIN A1C
HEMOGLOBIN A1C: 10.2 % — AB (ref 4.8–5.6)
MEAN PLASMA GLUCOSE: 246.04 mg/dL

## 2018-05-04 LAB — HIV ANTIBODY (ROUTINE TESTING W REFLEX): HIV SCREEN 4TH GENERATION: NONREACTIVE

## 2018-05-04 MED ORDER — BENZONATATE 100 MG PO CAPS: 100.0000 mg | ORAL_CAPSULE | Freq: Three times a day (TID) | ORAL | Status: DC | PRN

## 2018-05-04 MED ORDER — STROKE: EARLY STAGES OF RECOVERY BOOK
Freq: Once | Status: AC
  Administered 2018-05-04: 03:00:00
  Filled 2018-05-04: qty 1

## 2018-05-04 MED ORDER — AMLODIPINE BESYLATE 10 MG PO TABS
10.0000 mg | ORAL_TABLET | Freq: Every day | ORAL | Status: DC
  Administered 2018-05-04 – 2018-05-06 (×3): 10 mg via ORAL
  Filled 2018-05-04 (×4): qty 1

## 2018-05-04 MED ORDER — PROPRANOLOL HCL 20 MG PO TABS
20.0000 mg | ORAL_TABLET | Freq: Two times a day (BID) | ORAL | Status: DC
  Administered 2018-05-04 (×2): 20 mg via ORAL
  Filled 2018-05-04 (×2): qty 1

## 2018-05-04 MED ORDER — ENOXAPARIN SODIUM 40 MG/0.4ML ~~LOC~~ SOLN
40.0000 mg | SUBCUTANEOUS | Status: DC
  Administered 2018-05-04 – 2018-05-05 (×2): 40 mg via SUBCUTANEOUS
  Filled 2018-05-04 (×2): qty 0.4

## 2018-05-04 MED ORDER — HYDRALAZINE HCL 20 MG/ML IJ SOLN
5.0000 mg | INTRAMUSCULAR | Status: DC | PRN
  Administered 2018-05-05: 5 mg via INTRAVENOUS
  Filled 2018-05-04: qty 1

## 2018-05-04 MED ORDER — INSULIN GLARGINE 100 UNIT/ML ~~LOC~~ SOLN
14.0000 [IU] | Freq: Every day | SUBCUTANEOUS | Status: DC
  Administered 2018-05-04 – 2018-05-05 (×2): 14 [IU] via SUBCUTANEOUS
  Filled 2018-05-04 (×2): qty 0.14

## 2018-05-04 MED ORDER — SODIUM CHLORIDE 0.9 % IV SOLN
INTRAVENOUS | Status: DC
  Administered 2018-05-04: 02:00:00 via INTRAVENOUS

## 2018-05-04 MED ORDER — NITROGLYCERIN 0.4 MG SL SUBL: 0.4000 mg | SUBLINGUAL_TABLET | SUBLINGUAL | Status: DC | PRN

## 2018-05-04 MED ORDER — ZOLPIDEM TARTRATE 5 MG PO TABS: 5.0000 mg | ORAL_TABLET | Freq: Every evening | ORAL | Status: DC | PRN

## 2018-05-04 MED ORDER — SENNOSIDES-DOCUSATE SODIUM 8.6-50 MG PO TABS: 1.0000 | ORAL_TABLET | Freq: Every evening | ORAL | Status: DC | PRN

## 2018-05-04 MED ORDER — INSULIN ASPART 100 UNIT/ML ~~LOC~~ SOLN
0.0000 [IU] | Freq: Three times a day (TID) | SUBCUTANEOUS | Status: DC
  Administered 2018-05-04: 3 [IU] via SUBCUTANEOUS
  Administered 2018-05-04 – 2018-05-05 (×4): 2 [IU] via SUBCUTANEOUS
  Administered 2018-05-06: 7 [IU] via SUBCUTANEOUS
  Filled 2018-05-04 (×6): qty 1

## 2018-05-04 MED ORDER — ONDANSETRON HCL 4 MG/2ML IJ SOLN
4.0000 mg | Freq: Three times a day (TID) | INTRAMUSCULAR | Status: DC | PRN
  Administered 2018-05-04: 4 mg via INTRAVENOUS
  Filled 2018-05-04: qty 2

## 2018-05-04 MED ORDER — CARVEDILOL 3.125 MG PO TABS
3.1250 mg | ORAL_TABLET | Freq: Two times a day (BID) | ORAL | Status: DC
  Administered 2018-05-04 – 2018-05-05 (×2): 3.125 mg via ORAL
  Filled 2018-05-04 (×2): qty 1

## 2018-05-04 MED ORDER — PERFLUTREN LIPID MICROSPHERE
1.0000 mL | INTRAVENOUS | Status: AC | PRN
  Administered 2018-05-04: 2 mL via INTRAVENOUS
  Filled 2018-05-04: qty 10

## 2018-05-04 MED ORDER — ASPIRIN 300 MG RE SUPP: 300.0000 mg | Freq: Every day | RECTAL | Status: DC

## 2018-05-04 MED ORDER — ATORVASTATIN CALCIUM 80 MG PO TABS
80.0000 mg | ORAL_TABLET | Freq: Every day | ORAL | Status: DC
  Administered 2018-05-04 – 2018-05-05 (×3): 80 mg via ORAL
  Filled 2018-05-04 (×3): qty 1

## 2018-05-04 MED ORDER — INSULIN ASPART 100 UNIT/ML ~~LOC~~ SOLN: 0.0000 [IU] | Freq: Every day | SUBCUTANEOUS | Status: DC

## 2018-05-04 MED ORDER — ASPIRIN 325 MG PO TABS
325.0000 mg | ORAL_TABLET | Freq: Every day | ORAL | Status: DC
  Administered 2018-05-04 – 2018-05-06 (×3): 325 mg via ORAL
  Filled 2018-05-04 (×3): qty 1

## 2018-05-04 MED ORDER — BUMETANIDE 1 MG PO TABS
1.0000 mg | ORAL_TABLET | Freq: Every day | ORAL | Status: DC
  Administered 2018-05-04 – 2018-05-06 (×3): 1 mg via ORAL
  Filled 2018-05-04 (×3): qty 1

## 2018-05-04 NOTE — Progress Notes (Signed)
RT NOTE:  Auto CPAP setup @ bedside for patient. Sterile water added. No O2 bled in. Masked fitted by RT. Pt will put on when ready for bed.

## 2018-05-04 NOTE — Progress Notes (Signed)
  Echocardiogram 2D Echocardiogram with definity has been performed.  Darlina Sicilian M 05/04/2018, 9:52 AM

## 2018-05-04 NOTE — Progress Notes (Signed)
Progress Note    Jacqueline [name]  JEH:[id number] DOB: 1981-07-13  DOA: 05/03/2018 PCP: Audley Hose, MD    Brief Narrative:     Medical records reviewed and are as summarized below:  Jacqueline [name] is an 37 y.o. female with medical history significant of hypertension, diabetes mellitus, anxiety, anemia, OSA on CPAP, morbid obesity, CKD 3, atrial fibrillation not on anticoagulants, who presents with headache, left facial numbness, chest pain.  Assessment/Plan:   Principal Problem:   Left facial numbness Active Problems:   HTN (hypertension)   Morbid obesity (HCC)   Type II diabetes mellitus with renal manifestations (HCC)   Anemia   Acute renal failure superimposed on stage 3 chronic kidney disease (HCC)   Hypertensive urgency   Headache   Elevated troponin   PAF (paroxysmal atrial fibrillation) (HCC)  Uncontrolled HTN causing Left facial numbness, chest pain, headache -MRI negative -cardiology consult: troponin's flat, they are changing her BP medications-- propanolol to coreg and adjust for results, holding benicar HCT until renal function improves -has been seen as an outpatient by opthalmology -- no disc flattening -echo pending  Morbid obesity ([place]):  -She is scheduled for Roux-en-Y procedure on November 13 Body mass index is 51.03 kg/m.  Type II diabetes mellitus with renal manifestations ([place]): [name] A1c 10.8 on 05/01/17, poorly controled. Patient is taking metformin, NovoLog and glargine insulin at home -will decrease glargine insulin dose from 20 to 14 units daily -SSI  Anemia: Hemoglobin stable.  Hemoglobin 9.7 on 01/27/2018--> 9.1 today -Follow-up by CBC  AoCKD-III: Baseline Cre is ~1.3, pt's Cre is 1.82 and BUN 23  on admission. Likely due to dehydration and continuation of ARB - BMP trend (labs ordered this AM are still not done)  Headache: Likely due to elevated blood pressure. -doubt temporal arteritis as patient is 37 yo -sed rate and CRP  elevated-- not sure etiology  Hx of PAF (paroxysmal atrial fibrillation): CHA2DS2-VASc Score is 2, needs oral anticoagulation, but pt is not on AC at home, not sure why. Heart rate is 90-110. Currently sinus rhythm. -defer to cards -coreg  Abnormal UA: UA showed cloudy appearance, no leukocytes, many bacteria, WBC 11-20, but pt denies symptoms with UTI. -Hold off antibiotics     Family Communication/Anticipated D/C date and plan/Code Status   DVT prophylaxis: Lovenox ordered. Code Status: Full Code.  Family Communication: at bedside Disposition Plan: pending improvement in BP and CR   Medical Consultants:    cards    Subjective:   Still with headache Has not eaten today  Objective:    Vitals:   05/03/18 2326 05/04/18 0533 05/04/18 0808 05/04/18 1258  BP: (!) 171/94 (!) 162/98 (!) 158/97 (!) 157/93  Pulse: (!) 105 88 92 91  Resp: 16 16 16 17   Temp: 98.2 F (36.8 C) 98.2 F (36.8 C) 98.2 F (36.8 C)   TempSrc: Oral Oral Oral   SpO2: 98% 97% 99% 95%  Weight:      Height:        Intake/Output Summary ([name] 24 hours) at 05/04/2018 1341 [name] data filed at 05/04/2018 0229 Gross per 24 hour  Intake -  Output 150 ml  Net -150 ml   Filed Weights   05/03/18 1342 05/03/18 2009  Weight: (!) 136.5 kg (!) 139.1 kg    Exam: In bed, NAD +LE edema-- not pitting +BS, soft rrr No increased work of breathing, no wheezing  Data Reviewed:   I have personally reviewed following labs and imaging  studies:  Labs: Labs show the following:   Basic Metabolic Panel: Recent Labs  Lab 05/03/18 1441  NA 136  K 4.1  CL 106  CO2 22  GLUCOSE 276*  BUN 23*  CREATININE 1.82*  CALCIUM 8.2*   GFR Estimated Creatinine Clearance: 59.4 mL/min (A) (by C-G formula based on SCr of 1.82 mg/dL (H)). Liver Function Tests: Recent Labs  Lab 05/03/18 1441  AST 23  ALT 15  ALKPHOS 69  BILITOT 0.4  PROT 6.2*  ALBUMIN 2.3*   No results for input(s): LIPASE, AMYLASE in  the [name] 168 hours. No results for input(s): AMMONIA in the [name] 168 hours. Coagulation profile No results for input(s): INR, PROTIME in the [name] 168 hours.  CBC: Recent Labs  Lab 05/03/18 1441  WBC 10.4  NEUTROABS 6.2  HGB 9.1*  HCT 28.8*  MCV 85.2  PLT 278   Cardiac Enzymes: Recent Labs  Lab 05/03/18 1358 05/03/18 1834 05/04/18 0217 05/04/18 0723  TROPONINI 0.05* 0.05* 0.03* 0.04*   BNP ([name] 3 results) No results for input(s): PROBNP in the [name] 8760 hours. CBG: Recent Labs  Lab 05/03/18 2127 05/04/18 1217  GLUCAP 196* 177*   D-Dimer: No results for input(s): DDIMER in the [name] 72 hours. Hgb A1c: Recent Labs    05/04/18 0217  HGBA1C 10.2*   Lipid Profile: Recent Labs    05/04/18 0217  CHOL 134  HDL 33*  LDLCALC 68  TRIG 166*  CHOLHDL 4.1   Thyroid function studies: Recent Labs    05/04/18 0717  TSH 4.173   Anemia work up: Recent Labs    05/04/18 0723  VITAMINB12 398   Sepsis Labs: Recent Labs  Lab 05/03/18 1441  WBC 10.4    Microbiology No results found for this or any previous visit (from the past 240 hour(s)).  Procedures and diagnostic studies:  Dg Chest 2 View  Result Date: 05/03/2018 CLINICAL DATA:  Lower extremity edema EXAM: CHEST - 2 VIEW COMPARISON:  01/31/2017 FINDINGS: There is no focal parenchymal opacity. There is no pleural effusion or pneumothorax. There is stable cardiomegaly. The osseous structures are unremarkable. IMPRESSION: No active cardiopulmonary disease. Electronically Signed   By: Kathreen Devoid   On: 05/03/2018 14:36   Ct Head Wo Contrast  Result Date: 05/03/2018 CLINICAL DATA:  Headache hypertension.  Left facial numbness EXAM: CT HEAD WITHOUT CONTRAST TECHNIQUE: Contiguous axial images were obtained from the base of the skull through the vertex without intravenous contrast. COMPARISON:  MRI head 05/01/2017 FINDINGS: Brain: No evidence of acute infarction, hemorrhage, hydrocephalus, extra-axial collection  or mass lesion/mass effect. Vascular: Negative for hyperdense vessel Skull: Negative Sinuses/Orbits: Negative Other: None IMPRESSION: Negative CT head Electronically Signed   By: Franchot Gallo M.D.   On: 05/03/2018 14:28   Mr Brain Wo Contrast  Result Date: 05/04/2018 CLINICAL DATA:  37 y/o F; 1 day history of headache and left facial tingling common tear mid chest pain, dyspnea on exertion, hypertension. EXAM: MRI HEAD WITHOUT CONTRAST TECHNIQUE: Multiplanar, multiecho pulse sequences of the brain and surrounding structures were obtained without intravenous contrast. COMPARISON:  05/01/2017 MRI head.  05/03/2018 CT head. FINDINGS: Brain: No acute infarction, hemorrhage, hydrocephalus, extra-axial collection or mass lesion. Vascular: Normal flow voids. Skull and upper cervical spine: Normal marrow signal. Sinuses/Orbits: Small mucous retention cyst within the right maxillary sinus antrum. Paranasal sinuses are otherwise normal. No abnormal signal of the mastoid air cells. Orbits are unremarkable. Other: None. IMPRESSION: No acute intracranial abnormality identified. Stable unremarkable  MRI of the brain. Electronically Signed   By: Kristine Garbe M.D.   On: 05/04/2018 04:08    Medications:   . amLODipine  10 mg Oral Daily  . aspirin  300 mg Rectal Daily   Or  . aspirin  325 mg Oral Daily  . atorvastatin  80 mg Oral q1800  . bumetanide  1 mg Oral Daily  . carvedilol  3.125 mg Oral BID WC  . enoxaparin (LOVENOX) injection  40 mg Subcutaneous Q24H  . insulin aspart  0-5 Units Subcutaneous QHS  . insulin aspart  0-9 Units Subcutaneous TID WC  . insulin glargine  14 Units Subcutaneous Q2200   Continuous Infusions:   LOS: 0 days   Geradine Girt  [facility]   *Please refer to [email or web address], password [email or web address] to get updated schedule on who will round on this patient, as hospitalists switch teams weekly. If 7PM-7AM, please contact night-coverage at [email or web address], password [email or web address] for  any overnight needs.  05/04/2018, 1:41 PM

## 2018-05-04 NOTE — Care Management (Signed)
#    4.  S/W JENNIFER @ [facility] RX # [phone]  1. XARELTO  15 MG  BID    AND   20 MG DAILY COVER-  YES CO-PAY- ZERO DOLLARS  FOR EACH PRESCRIPTION TIER- NO PRIOR APPROVAL-NO   2. ELIQUIS  2.5 MG  BID  AND    5 MG BID COVER- YES CO-PAY- ZERO DOLLARS  FOR EACH PRESCRIPTION TIER- NO PRIOR APPROVAL- NO  PREFERRED PHARMACY : YES [facility]- NONE FORMULARY [facility]- NONE FORMULARY

## 2018-05-04 NOTE — Consult Note (Addendum)
Cardiology Consult    Patient ID: Quorra Rosene MRN: [id number], DOB/AGE: Dec 08, 1980   Admit date: 05/03/2018 Date of Consult: 05/04/2018  Primary Physician: Audley Hose, MD Primary Cardiologist: Minus Breeding, MD Requesting Provider: Dr. Eliseo Squires.  Patient Profile    Deryl Ports is a morbidly obese 37 y.o. African-American female with a history of hypertension, type 2 diabetes mellitus, sleep apnea on CPAP, who is being seen today for the evaluation of chest pain at the request of Dr. Eliseo Squires.  History of Present Illness    Ms. Fernando has a history of chest pain and shortness of breath. She was admitted in 12/2015 for chest pain. Left heart catheterization at that time showed normal coronary arteries. Chest pain was felt to be noncardiac. She was most recently seen by Dr. Percival Spanish in 05/2017 for evaluation of chronic shortness of breath after being seen in the ED the day before. At that time, she also reported sporadic chest pain that she described as sharp. Echocardiogram at that time showed LVEF of 65-70% with mild LVH and normal wall motion.   Patient was being evaluated at a [facility] this morning for possible bariatric surgery and was noted to be hypertensive at that time. Patient was advised to go to her PCP who gave her medications. She also noted some left sided face tingling for the past 2 days. She remained hypertensive and was advised to go to the ED for concern of possible stroke.   Patient reports some intermittent chest pain and shortness of breath both at rest and with exertion. She does feel like her shortness of breath has worsened some since she last saw Dr. Percival Spanish about 1 year ago. Patient describes the chest pain as a "pressure" and states it feels like someone is "standing on her chest." At its worse, she ranks it as a 8/10 on the pain scale. It last for a couple of minutes and resolves when she relaxes. She has never tried any medicines for the pain. She  sometimes has associated nausea with the pain. She vomited once about 2 nights ago when she was having some shortness of breath and minimal chest pain. Patient has sleep apnea and is on CPAP. She sleeps on an incline and sometimes wakes up having difficulty breathing. She has some chronic lower extremity edema. She takes Bumex as needed for the edema which helps. She reports some dizziness over the past week after standing up and walking for a while.   Upon arrival to ED, BP 198/109. EKG showed sinus tachycardia, rate 110 bpm, with T wave inversion in lateral leads (T wave inversions also seen on EKG from 02/14/2018 when patient was also complaining of chest pain). Troponin minimally elevated (0.05 >> 0.05 >> 0.03 >> 0.04). Chest x-ray showed no acute findings. WBC 11.0, Hgb 9.7, Plts 348. Na 137, K 3.6, Glucose 240, Scr 1.58. BNP normal. D-dimer elevated at 1.16.   Currently, patient reports having a headache, minimal chest pain, and leg pain from the edema. She also notes some tingling in her left hand that recently started which she thinks is from the way she has been laying on her arm.   Past Medical History   Past Medical History:  Diagnosis Date  . Anemia   . Anxiety   . Chronic bronchitis ([place])   . Hypertension   . Increased frequency of headaches   . Morbid obesity ([place])   . Sleep apnea   . Type II diabetes mellitus ([place])  Past Surgical History:  Procedure Laterality Date  . BREAST REDUCTION SURGERY  03/21/2017  . CARDIAC CATHETERIZATION  01/06/2016  . CARDIAC CATHETERIZATION N/A 01/06/2016   Procedure: Left Heart Cath and Coronary Angiography;  Surgeon: Wellington Hampshire, MD;  Location: [facility];  Service: Cardiovascular;  Laterality: N/A;  . CESAREAN SECTION  08/2014     Allergies  No Known Allergies  Inpatient Medications    . amLODipine  10 mg Oral Daily  . aspirin  300 mg Rectal Daily   Or  . aspirin  325 mg Oral Daily  . atorvastatin  80 mg Oral q1800  .  bumetanide  1 mg Oral Daily  . enoxaparin (LOVENOX) injection  40 mg Subcutaneous Q24H  . insulin glargine  14 Units Subcutaneous Q2200  . propranolol  20 mg Oral BID    Family History    Family History  Problem Relation Age of Onset  . Diabetes Mother   . Hypertension Mother   . Thyroid disease Mother   . Kidney disease Maternal Grandmother   . Diabetes Maternal Grandmother   . Heart attack Unknown    She indicated that her mother is alive. She indicated that her sister is alive. She indicated that her maternal grandmother is deceased. She indicated that the status of her unknown relative is unknown.   Social History    Social History   Socioeconomic History  . Marital status: Married    Spouse name: Not on file  . Number of children: Not on file  . Years of education: Not on file  . Highest education level: Not on file  Occupational History  . Not on file  Social Needs  . Financial resource strain: Not on file  . Food insecurity:    Worry: Not on file    Inability: Not on file  . Transportation needs:    Medical: Not on file    Non-medical: Not on file  Tobacco Use  . Smoking status: Never Smoker  . Smokeless tobacco: Never Used  Substance and Sexual Activity  . Alcohol use: No  . Drug use: No  . Sexual activity: Yes    Birth control/protection: None  Lifestyle  . Physical activity:    Days per week: Not on file    Minutes per session: Not on file  . Stress: Not on file  Relationships  . Social connections:    Talks on phone: Not on file    Gets together: Not on file    Attends religious service: Not on file    Active member of club or organization: Not on file    Attends meetings of clubs or organizations: Not on file    Relationship status: Not on file  . Intimate partner violence:    Fear of current or ex partner: Not on file    Emotionally abused: Not on file    Physically abused: Not on file    Forced sexual activity: Not on file  Other Topics  Concern  . Not on file  Social History Narrative  . Not on file     Review of Systems    Review of Systems  Constitutional: Negative for diaphoresis and weight loss.  HENT: Negative for congestion.   Eyes: Positive for blurred vision. Negative for double vision.  Respiratory: Positive for shortness of breath. Negative for cough, hemoptysis and sputum production.   Cardiovascular: Positive for chest pain, orthopnea (history of sleep apnea, on CPAP), leg swelling and PND (  history of sleep apnea, on CPAP). Negative for palpitations.  Gastrointestinal: Positive for constipation, nausea and vomiting (once about 2 nights ago). Negative for abdominal pain, blood in stool, diarrhea and heartburn.  Genitourinary: Negative for hematuria.  Musculoskeletal: Negative for falls and neck pain.       Leg pain secondary to edema  Neurological: Positive for dizziness, tingling and headaches. Negative for focal weakness, loss of consciousness and weakness.  Endo/Heme/Allergies: Negative for environmental allergies. Does not bruise/bleed easily.  Psychiatric/Behavioral: Nervous/anxious: history of anxiety.     Physical Exam    Blood pressure (!) 158/97, pulse 92, temperature 98.2 F (36.8 C), temperature source Oral, resp. rate 16, height 5\' 5"  (1.651 m), weight (!) 139.1 kg, last menstrual period 04/17/2018, SpO2 99 %.  General: 37 y.o. morbidly obese African-American female resting comfortably in no acute distress. Pleasant and cooperative. HEENT: Normal  Neck: Supple. No bruits. JVD difficult to assess due to body habitus.  Lungs: No increased work of breathing. Clear to auscultation bilaterally. No wheezes, rhonchi, or rales. Heart: RRR. Distinct S1 and S2. No murmurs, gallops, or rubs.  Abdomen: Soft, non-distended, and non-tender to palpation. Bowel sounds present in all 4 quadrants.   Extremities: 1+ pitting edema of bilateral lower extremities. Tenderness to palpation of lower extremities.  Distal pedal pulses and radial pulses 2+ and equal bilaterally. Neuro: Alert and oriented x3. No focal deficits. Moves all extremities spontaneously. Psych: Normal affect.  Labs    Troponin (Point of Care Test) No results for input(s): TROPIPOC in the last 72 hours. Recent Labs    05/03/18 1358 05/03/18 1834 05/04/18 0217 05/04/18 0723  TROPONINI 0.05* 0.05* 0.03* 0.04*   Lab Results  Component Value Date   WBC 10.4 05/03/2018   HGB 9.1 (L) 05/03/2018   HCT 28.8 (L) 05/03/2018   MCV 85.2 05/03/2018   PLT 278 05/03/2018    Recent Labs  Lab 05/03/18 1441  NA 136  K 4.1  CL 106  CO2 22  BUN 23*  CREATININE 1.82*  CALCIUM 8.2*  PROT 6.2*  BILITOT 0.4  ALKPHOS 69  ALT 15  AST 23  GLUCOSE 276*   Lab Results  Component Value Date   CHOL 134 05/04/2018   HDL 33 (L) 05/04/2018   LDLCALC 68 05/04/2018   TRIG 166 (H) 05/04/2018   Lab Results  Component Value Date   DDIMER 1.16 (H) 02/14/2018     Radiology Studies    Dg Chest 2 View  Result Date: 05/03/2018 CLINICAL DATA:  Lower extremity edema EXAM: CHEST - 2 VIEW COMPARISON:  01/31/2017 FINDINGS: There is no focal parenchymal opacity. There is no pleural effusion or pneumothorax. There is stable cardiomegaly. The osseous structures are unremarkable. IMPRESSION: No active cardiopulmonary disease. Electronically Signed   By: Kathreen Devoid   On: 05/03/2018 14:36   Ct Head Wo Contrast  Result Date: 05/03/2018 CLINICAL DATA:  Headache hypertension.  Left facial numbness EXAM: CT HEAD WITHOUT CONTRAST TECHNIQUE: Contiguous axial images were obtained from the base of the skull through the vertex without intravenous contrast. COMPARISON:  MRI head 05/01/2017 FINDINGS: Brain: No evidence of acute infarction, hemorrhage, hydrocephalus, extra-axial collection or mass lesion/mass effect. Vascular: Negative for hyperdense vessel Skull: Negative Sinuses/Orbits: Negative Other: None IMPRESSION: Negative CT head Electronically  Signed   By: Franchot Gallo M.D.   On: 05/03/2018 14:28   Mr Brain Wo Contrast  Result Date: 05/04/2018 CLINICAL DATA:  37 y/o F; 1 day history of headache and left  facial tingling common tear mid chest pain, dyspnea on exertion, hypertension. EXAM: MRI HEAD WITHOUT CONTRAST TECHNIQUE: Multiplanar, multiecho pulse sequences of the brain and surrounding structures were obtained without intravenous contrast. COMPARISON:  05/01/2017 MRI head.  05/03/2018 CT head. FINDINGS: Brain: No acute infarction, hemorrhage, hydrocephalus, extra-axial collection or mass lesion. Vascular: Normal flow voids. Skull and upper cervical spine: Normal marrow signal. Sinuses/Orbits: Small mucous retention cyst within the right maxillary sinus antrum. Paranasal sinuses are otherwise normal. No abnormal signal of the mastoid air cells. Orbits are unremarkable. Other: None. IMPRESSION: No acute intracranial abnormality identified. Stable unremarkable MRI of the brain. Electronically Signed   By: Kristine Garbe M.D.   On: 05/04/2018 04:08    EKG     EKG: EKG was personally reviewed and demonstrates: sinus tachycardia, rate 110 bpm, with T wave inversion in lateral leads (T wave inversion also seen in 01/2018 when patient presented to ED with chest pain).   Telemetry: Telemetry was personally reviewed and demonstrates: normal sinus rhythm/tachycardia, rate ranging from 90s to 120s bpm.  Cardiac Imaging    _______________  Echocardiogram 06/15/2017: Study Conclusions: - Left ventricle: The cavity size was normal. Wall thickness was   increased in a pattern of mild LVH. Systolic function was   vigorous. The estimated ejection fraction was in the range of 65%   to 70%. Wall motion was normal; there were no regional wall   motion abnormalities. The study is not technically sufficient to   allow evaluation of LV diastolic function. - Mitral valve: Mildly thickened leaflets . There was trivial   regurgitation. -  Left atrium: The atrium was normal in size. - Inferior vena cava: The vessel was normal in size. The   respirophasic diameter changes were in the normal range (>= 50%),   consistent with normal central venous pressure.  Impressions: - LVEF 65-70%, mild LVH, normal wall motion, trivial MR, normal LA   size, normal IVC. _______________  Left Heart Catheterization 01/06/2016: Findings: 1. Normal coronary arteries. 2. Left ventricular angiography was not performed. EF was normal by echocardiogram recently. 3. Mildly elevated left ventricular end-diastolic pressure with mild gradient across the LVOT.  Recommendations: The chest pain is likely noncardiac. The patient vomited at the end of the case and was given Zofran.  Discharge home tomorrow if no other issues.  Assessment & Plan    1. Chest Pain with Elevated Troponin - Patient has chronic intermittent chest pain for the past few years. Patient describes the recent pain as pressure that occurs both at rest and with exertion. The pain last for a couple of minutes and then resolves when she is able to relax. - Left heart catheterization in 2017 showed normal coronary arteries and chest pain was felt to be noncardiac at that time. - EKG in ED showed sinus tachycardia with T wave inversions in lateral leads. T wave inversions also seen in 01/2018 when patient presented to the ED with chest pain.  - Troponin minimally elevated (0.05 >> 0.05 >> 0.03 >> 0.04) - Echo today showed LVEF 60-65% with moderate LVH and grossly normal wall motion.  - Patient notes minimal chest pain at this time.  - With normal coronaries in 2017 and no significant changes on Echo today, elevated troponin most likely demand ischemia in the setting of hypertensive urgency.   2. Chronic Shortness of Breath with Diastolic Dysfunction - Patient hast chronic shortness of breath but does feel like it has worsened some since she Dr. Percival Spanish in  05/2017.  - Echo on 06/15/2017  showed LVEF 65-70% with mild LVH and normal wall motion.  - Echo today showed LVEF 60-65% with moderate LVH, grossly normal wall motion, and grade 2 diastolic dysfunction with elevated LV filling pressure.   3. Hypertensive Urgency - Improving but still elevated. BP 198/109 in the ED. Most recent BP 158/97.  - Continue Amlodipine 10mg  daily. - Patient on Propranolol 20mg  twice daily for symptomatic tachycardia. Will switch to Coreg 3.125mg  twice daily for better blood pressure control. OK to titrate up on dose tomorrow morning if patient tolerates it will.  - Recommended starting Benicar HCT when patients renal function improves.   4. Chronic Lower Extremity Edema - Most likely due to venous insufficiency. - OK to continue Bumex as needed per PCP.  - Recommended wearing compression stockings.   Signed, Darreld Mclean, PA-C 05/04/2018, 10:12 AM  For questions or updates, please contact   Please consult [email or web address] for contact info under Cardiology/STEMI.  ---------------------------------------------------------------------------------------------   History and all data above reviewed.  Patient examined.  I agree with the findings as above.  Carole Dutko is a very pleasant 37 year old female who presents with chest pain, shortness of breath, and significant hypertension.  She has had a coronary angiogram in 2017 which showed normal coronary arteries, her presentation at that time was for chest pain and shortness of breath.  Her echocardiogram in the past that showed an LV ejection fraction of 65 to 70% with mild LVH and normal wall motion.  Her presentation on admission was prompted by hypertension at her outpatient doctor's visits, she is also had left-sided facial tingling, and concern was raised for possible stroke.  Her chest discomfort feels like someone is standing on her chest and can occur both at rest and with exertion.  Constitutional: No acute distress Eyes: pupils  equally round and reactive to light, sclera non-icteric, normal conjunctiva and lids ENMT: normal dentition, moist mucous membranes Cardiovascular: regular rhythm, normal rate, no murmurs. S1 and S2 normal. Radial pulses normal bilaterally. No jugular venous distention.  Respiratory: clear to auscultation bilaterally GI : normal bowel sounds, soft and nontender. No distention.  Obese abdomen. MSK: extremities warm, well perfused. No edema.  NEURO: grossly nonfocal exam, moves all extremities. PSYCH: alert and oriented x 3, normal mood and affect.   All available labs, radiology testing, previous records reviewed. Agree with documented assessment and plan of my colleague as stated above with the following additions or changes:  Principal Problem:   Chest pain Active Problems:   HTN (hypertension)   Morbid obesity ([place])   Type II diabetes mellitus with renal manifestations ([place])   Anemia   Acute renal failure superimposed on stage 3 chronic kidney disease ([facility])   Hypertensive urgency   Left facial numbness   Headache   Elevated troponin   Plan: An echocardiogram was pending at the time of consultation.  It is very reassuring that she has had a normal coronary angiogram as recently as 2017 for similar symptoms. Her echocardiogram show a preserved EF and no regional wall motion abnormalities. She does have elevated filling pressures, likely secondary to hypertensive change of the heart and diastolic dysfunction. It is most likely that her chest discomfort is a result of significantly elevated blood pressure, which she is on therapy for.  We can optimize her antihypertensives as follows: - Consider transition from olmesartan to combination of olmesartan HCTZ.  She may benefit from a thiazide diuretic for additional blood pressure  control.  Given her current renal dysfunction, this can be initiated as an outpatient after repeat electrolytes. - Continue amlodipine at dose of 10 mg daily. -  Transition propranolol to carvedilol for a more optimal cardiovascular medication.  Propranolol was prescribed for symptomatic tachycardia, however I believe she will have symptomatic benefit as well as blood pressure control from a transition to carvedilol.  A dose of 3.125 mg twice daily can be initiated in hospital with rapid up titration as tolerated.   Sande Rives PA and I have reviewed all available electrocardiograms, and do not find documentation of paroxysmal atrial fibrillation, which has previously been noted in her medical history.  This diagnosis seems to be in question.  [facility] will sign off.    Medication Recommendations:   In hospital: transition propranolol to coreg 3.125 mg BID, uptitrate in AM if tolerating well. Continue amlodipine 10 mg daily. As an outpatient: transition to olmesartan-HCTZ 20/12.5 combo pill with uptitration as tolerated.  Other recommendations (labs, testing, etc):  PCP follow up 1 week. She should undergo a workup for secondary causes of hypertension including renal ultrasound if not previously performed elsewhere. Follow up as an outpatient:  Cardiology follow up 4-6 weeks   Elouise Munroe, MD [facility] 3:21 PM  05/04/2018

## 2018-05-05 DIAGNOSIS — R079 Chest pain, unspecified: Secondary | ICD-10-CM

## 2018-05-05 DIAGNOSIS — I160 Hypertensive urgency: Secondary | ICD-10-CM | POA: Diagnosis not present

## 2018-05-05 LAB — URINE CULTURE: Culture: <10000 — AB

## 2018-05-05 LAB — GLUCOSE, CAPILLARY
GLUCOSE-CAPILLARY: 191 mg/dL — AB (ref 70–99)
GLUCOSE-CAPILLARY: 177 mg/dL — AB (ref 70–99)
Glucose-Capillary: 175 mg/dL — ABNORMAL HIGH (ref 70–99)
Glucose-Capillary: 186 mg/dL — ABNORMAL HIGH (ref 70–99)

## 2018-05-05 LAB — BASIC METABOLIC PANEL
Anion gap: 4 — ABNORMAL LOW (ref 5–15)
BUN: 18 mg/dL (ref 6–20)
CALCIUM: 7.7 mg/dL — AB (ref 8.9–10.3)
CO2: 25 mmol/L (ref 22–32)
CREATININE: 1.81 mg/dL — AB (ref 0.44–1.00)
Chloride: 108 mmol/L (ref 98–111)
GFR calc Af Amer: 40 mL/min — ABNORMAL LOW (ref >60)
GFR, EST NON AFRICAN AMERICAN: 34 mL/min — AB (ref >60)
GLUCOSE: 237 mg/dL — AB (ref 70–99)
Potassium: 4 mmol/L (ref 3.5–5.1)
Sodium: 137 mmol/L (ref 135–145)

## 2018-05-05 MED ORDER — CLONIDINE HCL 0.1 MG PO TABS
0.1000 mg | ORAL_TABLET | ORAL | Status: DC | PRN
  Administered 2018-05-05 (×2): 0.1 mg via ORAL
  Filled 2018-05-05 (×2): qty 1

## 2018-05-05 MED ORDER — IRBESARTAN 150 MG PO TABS
150.0000 mg | ORAL_TABLET | Freq: Every day | ORAL | Status: DC
  Administered 2018-05-05 – 2018-05-06 (×2): 150 mg via ORAL
  Filled 2018-05-05 (×2): qty 1

## 2018-05-05 MED ORDER — CLONIDINE HCL 0.1 MG PO TABS
0.1000 mg | ORAL_TABLET | ORAL | Status: DC | PRN
  Administered 2018-05-05: 0.1 mg via ORAL
  Filled 2018-05-05: qty 1

## 2018-05-05 MED ORDER — INSULIN DEGLUDEC 200 UNIT/ML ~~LOC~~ SOPN: 30.0000 [IU] | PEN_INJECTOR | Freq: Every day | SUBCUTANEOUS | Status: DC

## 2018-05-05 MED ORDER — DULOXETINE HCL 60 MG PO CPEP
60.0000 mg | ORAL_CAPSULE | Freq: Every day | ORAL | Status: DC
  Administered 2018-05-05: 60 mg via ORAL
  Filled 2018-05-05: qty 1

## 2018-05-05 MED ORDER — CARVEDILOL 6.25 MG PO TABS
6.2500 mg | ORAL_TABLET | Freq: Two times a day (BID) | ORAL | Status: DC
  Administered 2018-05-05: 6.25 mg via ORAL
  Filled 2018-05-05 (×2): qty 1

## 2018-05-05 MED ORDER — SUMATRIPTAN SUCCINATE 50 MG PO TABS
50.0000 mg | ORAL_TABLET | Freq: Once | ORAL | Status: AC
  Administered 2018-05-05: 50 mg via ORAL
  Filled 2018-05-05: qty 1

## 2018-05-05 MED ORDER — CARVEDILOL 3.125 MG PO TABS
3.1250 mg | ORAL_TABLET | ORAL | Status: AC
  Administered 2018-05-05: 3.125 mg via ORAL
  Filled 2018-05-05: qty 1

## 2018-05-05 MED ORDER — ATORVASTATIN CALCIUM 10 MG PO TABS: 10.0000 mg | ORAL_TABLET | Freq: Every evening | ORAL | Status: DC

## 2018-05-05 NOTE — Progress Notes (Signed)
PROGRESS NOTE    Jacqueline [name]  JKK:[id number] DOB: 09/17/1980 DOA: 05/03/2018 PCP: Audley Hose, MD   Brief Narrative:  Jacqueline [name] is an 37 y.o. female with medical history significant ofhypertension, diabetes mellitus, anxiety, anemia, OSA on CPAP,morbidobesity, CKD 3, atrial fibrillation not on anticoagulants, who presents with headache, left facial numbness, chest pain.   Assessment & Plan:   Principal Problem:   Chest pain Active Problems:   HTN (hypertension)   Morbid obesity (HCC)   Type II diabetes mellitus with renal manifestations (HCC)   Anemia   Acute renal failure superimposed on stage 3 chronic kidney disease (HCC)   Hypertensive urgency   Left facial numbness   Headache   Elevated troponin   Uncontrolled HTN causing Left facial numbness, chest pain, headache -MRI negative -cardiology consult: troponin's flat, they are changing her BP medications-- propanolol to coreg with increase in coreg dose today,   - benicar restated (as avapro while in hospital) as benefits outweigh risk in mild creat elevation also BP minimally responsive to other tolerable meds -has been seen as an outpatient by opthalmology -- no disc flattening -echo as below  Morbid obesity ([place]): -She is scheduled for Roux-en-Y procedure on November 13 Body mass index is 51.03 kg/m.  Type II diabetes mellitus with renal manifestations (HCC):Last A1c10.8 on 05/01/17, poorlycontroled. Patient is takingmetformin, NovoLog and glargine insulinat home -will continue current glargine insulin dose  - 14units daily -SSI  Anemia:Hemoglobin stable. Hemoglobin 9.7 on7/13/2019-->9.1 --> 8.7 today -Follow-up by CBC  AoCKD-III: Baseline Cre is~1.3, pt's Cre is1.81 and stable today. Likely due to worsening renal function due to poorly controlled BP as hydration and holding ARB have been unsuccessful in lowering creatinine - BMP trend   Headache:Likely due to elevated blood  pressure.WIll try sumatriptan now -doubt temporal arteritis as patient is 37 yo -sed rate and CRP elevated-- not sure etiology  Hx ofPAF (paroxysmal atrial fibrillation):CHA2DS2-VASc Scoreis 2, needs oral anticoagulation, but pt is not on ACat home, not sure why.Heart rate is90-110. Currentlysinus rhythm. -defer to cards -coreg  Abnormal UA:UA showedcloudy appearance, no leukocytes, many bacteria, WBC 11-20, but ptdenies symptoms with UTI. Urine culture with insignificant growth  DVT prophylaxis: Lovenox ordered. Code Status: Full Code.  Family Communication:  none present pt retains capacity Disposition Plan: pending improvement in BP and headache    Procedures:  ECHO: Technically difficult study. Definity contrast given. LVEF   60-65%, moderate LVH, grossly normal wall motion, grade 2 DD,   elevated LV filling pressure, mildly dilated LA, normal IVC.     Subjective: BP still high and still with headache  Objective: Vitals:   05/05/18 1205 05/05/18 1303 05/05/18 1430 05/05/18 1431  BP: (!) 185/101 (!) 178/89 (!) 183/102 (!) 169/87  Pulse: (!) 102 (!) 101 (!) 112 (!) 112  Resp: 18 18    Temp: 98.6 F (37 C) 98.4 F (36.9 C)    TempSrc: Oral Oral    SpO2: 100% 100%  97%  Weight:      Height:       No intake or output data in the 24 hours ending 05/05/18 1500 Filed Weights   05/03/18 1342 05/03/18 2009  Weight: (!) 136.5 kg (!) 139.1 kg    Examination:  General exam: Appears calm and comfortable  Respiratory system: Clear to auscultation. Respiratory effort normal. Cardiovascular system: S1 & S2 heard, RRR. No JVD, murmurs, rubs, gallops or clicks. No pedal edema. Gastrointestinal system: Abdomen is nondistended, soft and nontender. No organomegaly  or masses felt. Normal bowel sounds heard. Central nervous system: Alert and oriented. No focal neurological deficits. Extremities: Symmetric 5 x 5 power. Skin: No rashes, lesions or ulcers Psychiatry:  Judgement and insight appear normal. Mood & affect appropriate.     Data Reviewed: I have personally reviewed following labs and imaging studies  CBC: Recent Labs  Lab 05/03/18 1441 05/04/18 1353  WBC 10.4 9.4  NEUTROABS 6.2  --   HGB 9.1* 8.7*  HCT 28.8* 27.4*  MCV 85.2 84.0  PLT 278 191   Basic Metabolic Panel: Recent Labs  Lab 05/03/18 1441 05/04/18 1353 05/05/18 0632  NA 136 140 137  K 4.1 3.9 4.0  CL 106 110 108  CO2 22 24 25   GLUCOSE 276* 199* 237*  BUN 23* 17 18  CREATININE 1.82* 1.74* 1.81*  CALCIUM 8.2* 8.0* 7.7*   GFR: Estimated Creatinine Clearance: 59.7 mL/min (A) (by C-G formula based on SCr of 1.81 mg/dL (H)). Liver Function Tests: Recent Labs  Lab 05/03/18 1441  AST 23  ALT 15  ALKPHOS 69  BILITOT 0.4  PROT 6.2*  ALBUMIN 2.3*   Cardiac Enzymes: Recent Labs  Lab 05/03/18 1358 05/03/18 1834 05/04/18 0217 05/04/18 0723 05/04/18 1353  TROPONINI 0.05* 0.05* 0.03* 0.04* 0.03*   HbA1C: Recent Labs    05/04/18 0217  HGBA1C 10.2*   CBG: Recent Labs  Lab 05/04/18 1217 05/04/18 1640 05/04/18 1918 05/05/18 0731 05/05/18 1158  GLUCAP 177* 223* 194* 191* 177*   Lipid Profile: Recent Labs    05/04/18 0217  CHOL 134  HDL 33*  LDLCALC 68  TRIG 166*  CHOLHDL 4.1   Thyroid Function Tests: Recent Labs    05/04/18 0717  TSH 4.173   Anemia Panel: Recent Labs    05/04/18 0723  VITAMINB12 398     Recent Results (from the past 240 hour(s))  Urine Culture     Status: Abnormal   Collection Time: 05/04/18  2:37 AM  Result Value Ref Range Status   Specimen Description URINE, RANDOM  Final   Special Requests NONE  Final   Culture (A)  Final    <10,000 COLONIES/mL INSIGNIFICANT GROWTH Performed at [facility], [place] [address] [place], [place] [address]    Report Status 05/05/2018 FINAL  Final  Culture, blood (Routine X 2) w Reflex to ID Panel     Status: None (Preliminary result)   Collection Time: 05/04/18  7:17 AM    Result Value Ref Range Status   Specimen Description BLOOD BLOOD RIGHT ARM  Final   Special Requests   Final    BOTTLES DRAWN AEROBIC ONLY Blood Culture adequate volume   Culture   Final    NO GROWTH 1 DAY Performed at [facility], [place] [address], [place], [place] [address]    Report Status PENDING  Incomplete  Culture, blood (Routine X 2) w Reflex to ID Panel     Status: None (Preliminary result)   Collection Time: 05/04/18  7:21 AM  Result Value Ref Range Status   Specimen Description BLOOD BLOOD RIGHT FOREARM  Final   Special Requests   Final    NONE Blood Culture results may not be optimal due to an excessive volume of blood received in culture bottles   Culture   Final    NO GROWTH 1 DAY Performed at [facility], [place] [address], [place], [place] [address]    Report Status PENDING  Incomplete  Radiology Studies: Mr Brain 77 Contrast  Result Date: 05/04/2018 CLINICAL DATA:  37 y/o F; 1 day history of headache and left facial tingling common tear mid chest pain, dyspnea on exertion, hypertension. EXAM: MRI HEAD WITHOUT CONTRAST TECHNIQUE: Multiplanar, multiecho pulse sequences of the brain and surrounding structures were obtained without intravenous contrast. COMPARISON:  05/01/2017 MRI head.  05/03/2018 CT head. FINDINGS: Brain: No acute infarction, hemorrhage, hydrocephalus, extra-axial collection or mass lesion. Vascular: Normal flow voids. Skull and upper cervical spine: Normal marrow signal. Sinuses/Orbits: Small mucous retention cyst within the right maxillary sinus antrum. Paranasal sinuses are otherwise normal. No abnormal signal of the mastoid air cells. Orbits are unremarkable. Other: None. IMPRESSION: No acute intracranial abnormality identified. Stable unremarkable MRI of the brain. Electronically Signed   By: Kristine Garbe M.D.   On: 05/04/2018 04:08        Scheduled Meds: . amLODipine  10 mg Oral Daily  . aspirin  300 mg Rectal  Daily   Or  . aspirin  325 mg Oral Daily  . atorvastatin  80 mg Oral q1800  . bumetanide  1 mg Oral Daily  . carvedilol  6.25 mg Oral BID WC  . DULoxetine  60 mg Oral QHS  . enoxaparin (LOVENOX) injection  40 mg Subcutaneous Q24H  . insulin aspart  0-5 Units Subcutaneous QHS  . insulin aspart  0-9 Units Subcutaneous TID WC  . insulin glargine  14 Units Subcutaneous Q2200  . irbesartan  150 mg Oral Daily   Continuous Infusions:   LOS: 0 days    Time spent: 45 mins    Lady Deutscher, MD [facility] Pager [phone]  If 7PM-7AM, please contact night-coverage [email or web address] Password [email or web address] 05/05/2018, 3:00 PM

## 2018-05-06 DIAGNOSIS — I160 Hypertensive urgency: Secondary | ICD-10-CM | POA: Diagnosis not present

## 2018-05-06 DIAGNOSIS — R079 Chest pain, unspecified: Secondary | ICD-10-CM | POA: Diagnosis not present

## 2018-05-06 LAB — CBC
HCT: 26.8 % — ABNORMAL LOW (ref 36.0–46.0)
Hemoglobin: 8.5 g/dL — ABNORMAL LOW (ref 12.0–15.0)
MCH: 26.5 pg (ref 26.0–34.0)
MCHC: 31.7 g/dL (ref 30.0–36.0)
MCV: 83.5 fL (ref 80.0–100.0)
NRBC: 0 % (ref 0.0–0.2)
Platelets: 351 10*3/uL (ref 150–400)
RBC: 3.21 MIL/uL — ABNORMAL LOW (ref 3.87–5.11)
RDW: 13.4 % (ref 11.5–15.5)
WBC: 9.5 10*3/uL (ref 4.0–10.5)

## 2018-05-06 LAB — BASIC METABOLIC PANEL
Anion gap: 6 (ref 5–15)
BUN: 19 mg/dL (ref 6–20)
CO2: 25 mmol/L (ref 22–32)
CREATININE: 1.79 mg/dL — AB (ref 0.44–1.00)
Calcium: 8 mg/dL — ABNORMAL LOW (ref 8.9–10.3)
Chloride: 106 mmol/L (ref 98–111)
GFR calc Af Amer: 40 mL/min — ABNORMAL LOW (ref >60)
GFR calc non Af Amer: 35 mL/min — ABNORMAL LOW (ref >60)
Glucose, Bld: 236 mg/dL — ABNORMAL HIGH (ref 70–99)
POTASSIUM: 3.9 mmol/L (ref 3.5–5.1)
Sodium: 137 mmol/L (ref 135–145)

## 2018-05-06 LAB — GLUCOSE, CAPILLARY
GLUCOSE-CAPILLARY: 309 mg/dL — AB (ref 70–99)
Glucose-Capillary: 197 mg/dL — ABNORMAL HIGH (ref 70–99)

## 2018-05-06 MED ORDER — ASPIRIN 325 MG PO TABS: 325.0000 mg | ORAL_TABLET | Freq: Every day | ORAL | 0 refills | Status: DC

## 2018-05-06 MED ORDER — BUMETANIDE 1 MG PO TABS: 1.0000 mg | ORAL_TABLET | Freq: Every day | ORAL | 1 refills | Status: DC

## 2018-05-06 MED ORDER — CARVEDILOL 25 MG PO TABS: 25.0000 mg | ORAL_TABLET | Freq: Two times a day (BID) | ORAL | Status: DC

## 2018-05-06 MED ORDER — BENZONATATE 100 MG PO CAPS: 100.0000 mg | ORAL_CAPSULE | Freq: Three times a day (TID) | ORAL | 0 refills | Status: DC

## 2018-05-06 MED ORDER — AMLODIPINE BESYLATE 10 MG PO TABS: 10.0000 mg | ORAL_TABLET | Freq: Every day | ORAL | 0 refills | Status: DC

## 2018-05-06 MED ORDER — CARVEDILOL 6.25 MG PO TABS
18.7500 mg | ORAL_TABLET | Freq: Once | ORAL | Status: AC
  Administered 2018-05-06: 18.75 mg via ORAL
  Filled 2018-05-06: qty 1

## 2018-05-06 MED ORDER — CARVEDILOL 25 MG PO TABS: 25.0000 mg | ORAL_TABLET | Freq: Two times a day (BID) | ORAL | 0 refills | Status: DC

## 2018-05-06 NOTE — Progress Notes (Signed)
D/c reviewed with patient. No further question at this time.

## 2018-05-06 NOTE — Discharge Summary (Signed)
Physician Discharge Summary  Jacqueline [name] TDH:[id number] DOB: Sep 17, 1980 DOA: 05/03/2018  PCP: Audley Hose, MD  Admit date: 05/03/2018 Discharge date: 05/06/2018  Admitted From: Home Disposition: Home  Recommendations for Outpatient Follow-up:  1. Follow up with PCP in 1-2 weeks 2. Please obtain BMP/CBC in one week   Home Health: No Equipment/Devices: None  Discharge Condition: Stable CODE STATUS: Full code Diet recommendation: Heart Healthy / Carb Modified   Brief/Interim Summary: Jacqueline Clercyis an 37 y.o.femalewith medical history significant ofhypertension, diabetes mellitus, anxiety, anemia, OSA on CPAP,morbidobesity, CKD 3, atrial fibrillation not on anticoagulants, who presents with headache, left facial numbness, chest pain.  He continued to have a headache and her blood pressure remained elevated.  We transitioned her from propranolol to carvedilol for better blood pressure maintenance and I also adjusted doses of her amlodipine.  Required 25 mg twice daily of carvedilol to get her blood pressure under control.  She also required 1 dose of sumatriptan and to treat her headache which ultimately resolved with better blood pressure control and the sumatriptan.  Globin A1c was noted to be elevated at greater than 10 suggesting very poor blood glucose control.  This will need to be addressed by her primary care physician Dr. Maia [name]  Patient has reached maximal benefit of hospitalization.  Discharge diagnosis, prognosis, plans, follow-up, medications and treatments discussed with the patient(or responsible party) and is in agreement with the plans as described.  Patient is stable for discharge.  Discharge Diagnoses:  Principal Problem:   Chest pain Active Problems:   HTN (hypertension)   Morbid obesity (HCC)   Type II diabetes mellitus with renal manifestations (HCC)   Anemia   Acute renal failure superimposed on stage 3 chronic kidney disease (HCC)   Hypertensive  urgency   Left facial numbness   Headache   Elevated troponin    Discharge Instructions  Discharge Instructions    Call MD for:  difficulty breathing, headache or visual disturbances   Complete by:  As directed    Call MD for:  persistant dizziness or light-headedness   Complete by:  As directed    Diet - low sodium heart healthy   Complete by:  As directed    Increase activity slowly   Complete by:  As directed      Allergies as of 05/06/2018   No Known Allergies     Medication List    STOP taking these medications   propranolol 20 MG tablet Commonly known as:  INDERAL     TAKE these medications   amLODipine 10 MG tablet Commonly known as:  NORVASC Take 1 tablet (10 mg total) by mouth daily. Start taking on:  05/07/2018 What changed:    medication strength  how much to take   aspirin 325 MG tablet Take 1 tablet (325 mg total) by mouth daily. Start taking on:  05/07/2018   atorvastatin 10 MG tablet Commonly known as:  LIPITOR Take 10 mg by mouth every evening.   benzonatate 100 MG capsule Commonly known as:  TESSALON Take 1 capsule (100 mg total) by mouth every 8 (eight) hours.   bumetanide 1 MG tablet Commonly known as:  BUMEX Take 1 tablet (1 mg total) by mouth daily. Start taking on:  05/07/2018 What changed:    when to take this  reasons to take this   carvedilol 25 MG tablet Commonly known as:  COREG Take 1 tablet (25 mg total) by mouth 2 (two) times daily with a meal.  DULoxetine 60 MG capsule Commonly known as:  CYMBALTA Take 60 mg by mouth at bedtime.   metFORMIN 500 MG tablet Commonly known as:  GLUCOPHAGE Take 500 mg by mouth 2 (two) times daily with a meal.   olmesartan 20 MG tablet Commonly known as:  BENICAR Take 20 mg by mouth daily.   TRESIBA FLEXTOUCH 200 UNIT/ML Sopn Generic drug:  Insulin Degludec Inject 30 Units into the skin at bedtime.      Follow-up Information    Barrett, Evelene Croon, PA-C Follow up on  05/29/2018.   Specialties:  Cardiology, Radiology Why:  Please arrive 15 minutes early for your 9:30am post-hospital cardiology follow-up appointment Contact information: [place] [address] [phone]        Audley Hose, MD Follow up.   Specialty:  Internal Medicine Contact information: [place] [address] [phone]        Minus Breeding, MD .   Specialty:  Cardiology Contact information: [address] [place] [address] [place] [address] [phone]          No Known Allergies    Procedures/Studies: Dg Chest 2 View  Result Date: 05/03/2018 CLINICAL DATA:  Lower extremity edema EXAM: CHEST - 2 VIEW COMPARISON:  01/31/2017 FINDINGS: There is no focal parenchymal opacity. There is no pleural effusion or pneumothorax. There is stable cardiomegaly. The osseous structures are unremarkable. IMPRESSION: No active cardiopulmonary disease. Electronically Signed   By: Kathreen Devoid   On: 05/03/2018 14:36   Ct Head Wo Contrast  Result Date: 05/03/2018 CLINICAL DATA:  Headache hypertension.  Left facial numbness EXAM: CT HEAD WITHOUT CONTRAST TECHNIQUE: Contiguous axial images were obtained from the base of the skull through the vertex without intravenous contrast. COMPARISON:  MRI head 05/01/2017 FINDINGS: Brain: No evidence of acute infarction, hemorrhage, hydrocephalus, extra-axial collection or mass lesion/mass effect. Vascular: Negative for hyperdense vessel Skull: Negative Sinuses/Orbits: Negative Other: None IMPRESSION: Negative CT head Electronically Signed   By: Franchot Gallo M.D.   On: 05/03/2018 14:28   Mr Brain Wo Contrast  Result Date: 05/04/2018 CLINICAL DATA:  37 y/o F; 1 day history of headache and left facial tingling common tear mid chest pain, dyspnea on exertion, hypertension. EXAM: MRI HEAD WITHOUT CONTRAST TECHNIQUE: Multiplanar, multiecho pulse sequences of the brain and surrounding  structures were obtained without intravenous contrast. COMPARISON:  05/01/2017 MRI head.  05/03/2018 CT head. FINDINGS: Brain: No acute infarction, hemorrhage, hydrocephalus, extra-axial collection or mass lesion. Vascular: Normal flow voids. Skull and upper cervical spine: Normal marrow signal. Sinuses/Orbits: Small mucous retention cyst within the right maxillary sinus antrum. Paranasal sinuses are otherwise normal. No abnormal signal of the mastoid air cells. Orbits are unremarkable. Other: None. IMPRESSION: No acute intracranial abnormality identified. Stable unremarkable MRI of the brain. Electronically Signed   By: Kristine Garbe M.D.   On: 05/04/2018 04:08    Subjective: Patient much improved.  No headache.  Blood pressures improved.  Discharge Exam: Vitals:   05/06/18 0629 05/06/18 1018  BP: (!) 168/81 (!) 153/89  Pulse: 98 92  Resp: 16 16  Temp: 99.1 F (37.3 C) 98.8 F (37.1 C)  SpO2: 96% 98%   Vitals:   05/06/18 0205 05/06/18 0255 05/06/18 0629 05/06/18 1018  BP: (!) 172/101 (!) 151/88 (!) 168/81 (!) 153/89  Pulse:   98 92  Resp:   16 16  Temp:   99.1 F (37.3 C) 98.8 F (37.1 C)  TempSrc:  Oral Oral  SpO2:   96% 98%  Weight:      Height:        General: Pt is alert, awake, not in acute distress Cardiovascular: RRR, S1/S2 +, no rubs, no gallops Respiratory: CTA bilaterally, no wheezing, no rhonchi Abdominal: Soft, NT, ND, bowel sounds + Extremities: no edema, no cyanosis    The results of significant diagnostics from this hospitalization (including imaging, microbiology, ancillary and laboratory) are listed below for reference.     Microbiology: Recent Results (from the past 240 hour(s))  Urine Culture     Status: Abnormal   Collection Time: 05/04/18  2:37 AM  Result Value Ref Range Status   Specimen Description URINE, RANDOM  Final   Special Requests NONE  Final   Culture (A)  Final    <10,000 COLONIES/mL INSIGNIFICANT GROWTH Performed at  [facility], [address] [place], [place] [address]    Report Status 05/05/2018 FINAL  Final  Culture, blood (Routine X 2) w Reflex to ID Panel     Status: None (Preliminary result)   Collection Time: 05/04/18  7:17 AM  Result Value Ref Range Status   Specimen Description BLOOD BLOOD RIGHT ARM  Final   Special Requests   Final    BOTTLES DRAWN AEROBIC ONLY Blood Culture adequate volume   Culture   Final    NO GROWTH 1 DAY Performed at [facility], [place] [address], [place], [place] [address]    Report Status PENDING  Incomplete  Culture, blood (Routine X 2) w Reflex to ID Panel     Status: None (Preliminary result)   Collection Time: 05/04/18  7:21 AM  Result Value Ref Range Status   Specimen Description BLOOD BLOOD RIGHT FOREARM  Final   Special Requests   Final    NONE Blood Culture results may not be optimal due to an excessive volume of blood received in culture bottles   Culture   Final    NO GROWTH 1 DAY Performed at [facility], [place] [address] [place], [place] [address]    Report Status PENDING  Incomplete     Labs: BNP (last 3 results) Recent Labs    10/01/17 2036 02/14/18 0833 05/03/18 1358  BNP 43.7 98.9 74.9   Basic Metabolic Panel: Recent Labs  Lab 05/03/18 1441 05/04/18 1353 05/05/18 0632 05/06/18 0202  NA 136 140 137 137  K 4.1 3.9 4.0 3.9  CL 106 110 108 106  CO2 22 24 25 25   GLUCOSE 276* 199* 237* 236*  BUN 23* 17 18 19   CREATININE 1.82* 1.74* 1.81* 1.79*  CALCIUM 8.2* 8.0* 7.7* 8.0*   Liver Function Tests: Recent Labs  Lab 05/03/18 1441  AST 23  ALT 15  ALKPHOS 69  BILITOT 0.4  PROT 6.2*  ALBUMIN 2.3*   CBC: Recent Labs  Lab 05/03/18 1441 05/04/18 1353 05/06/18 0202  WBC 10.4 9.4 9.5  NEUTROABS 6.2  --   --   HGB 9.1* 8.7* 8.5*  HCT 28.8* 27.4* 26.8*  MCV 85.2 84.0 83.5  PLT 278 349 351   Cardiac Enzymes: Recent Labs  Lab 05/03/18 1358 05/03/18 1834 05/04/18 0217 05/04/18 0723 05/04/18 1353   TROPONINI 0.05* 0.05* 0.03* 0.04* 0.03*   BNP: Invalid input(s): POCBNP CBG: Recent Labs  Lab 05/05/18 1158 05/05/18 1635 05/05/18 2212 05/06/18 0835 05/06/18 1235  GLUCAP 177* 175* 186* 309* 197*   Hgb A1c Recent Labs    05/04/18 0217  HGBA1C 10.2*   Lipid Profile Recent Labs    05/04/18 0217  CHOL 134  HDL 33*  LDLCALC 68  TRIG 166*  CHOLHDL 4.1   Thyroid function studies Recent Labs    05/04/18 0717  TSH 4.173   Anemia work up Recent Labs    05/04/18 0723  VITAMINB12 398   Urinalysis    Component Value Date/Time   COLORURINE YELLOW 05/03/2018 1358   APPEARANCEUR CLOUDY (A) 05/03/2018 1358   LABSPEC 1.020 05/03/2018 1358   PHURINE 7.0 05/03/2018 1358   GLUCOSEU >=500 (A) 05/03/2018 1358   HGBUR MODERATE (A) 05/03/2018 1358   [place] 05/03/2018 1358   KETONESUR NEGATIVE 05/03/2018 1358   PROTEINUR >300 (A) 05/03/2018 1358   UROBILINOGEN 0.2 08/21/2012 2130   NITRITE NEGATIVE 05/03/2018 1358   LEUKOCYTESUR NEGATIVE 05/03/2018 1358   Sepsis Labs Invalid input(s): PROCALCITONIN,  WBC,  LACTICIDVEN Microbiology Recent Results (from the past 240 hour(s))  Urine Culture     Status: Abnormal   Collection Time: 05/04/18  2:37 AM  Result Value Ref Range Status   Specimen Description URINE, RANDOM  Final   Special Requests NONE  Final   Culture (A)  Final    <10,000 COLONIES/mL INSIGNIFICANT GROWTH Performed at [facility], [address], [place], [place] [address]    Report Status 05/05/2018 FINAL  Final  Culture, blood (Routine X 2) w Reflex to ID Panel     Status: None (Preliminary result)   Collection Time: 05/04/18  7:17 AM  Result Value Ref Range Status   Specimen Description BLOOD BLOOD RIGHT ARM  Final   Special Requests   Final    BOTTLES DRAWN AEROBIC ONLY Blood Culture adequate volume   Culture   Final    NO GROWTH 1 DAY Performed at [facility], [place] [address], [place], [place] [address]    Report  Status PENDING  Incomplete  Culture, blood (Routine X 2) w Reflex to ID Panel     Status: None (Preliminary result)   Collection Time: 05/04/18  7:21 AM  Result Value Ref Range Status   Specimen Description BLOOD BLOOD RIGHT FOREARM  Final   Special Requests   Final    NONE Blood Culture results may not be optimal due to an excessive volume of blood received in culture bottles   Culture   Final    NO GROWTH 1 DAY Performed at [facility], [place] [address], [place], [place] [address]    Report Status PENDING  Incomplete     Time coordinating discharge: 45 minutes  SIGNED:   Lady Deutscher, MD  FACP [facility] 05/06/2018, 12:52 PM Pager   If 7PM-7AM, please contact night-coverage [email or web address] Password [email or web address]

## 2018-05-07 DIAGNOSIS — I160 Hypertensive urgency: Secondary | ICD-10-CM | POA: Diagnosis not present

## 2018-05-07 LAB — GLUCOSE, CAPILLARY: Glucose-Capillary: 196 mg/dL — ABNORMAL HIGH (ref 70–99)

## 2018-05-09 DIAGNOSIS — Z713 Dietary counseling and surveillance: Secondary | ICD-10-CM | POA: Diagnosis not present

## 2018-05-09 DIAGNOSIS — Z6841 Body Mass Index (BMI) 40.0 and over, adult: Secondary | ICD-10-CM | POA: Diagnosis not present

## 2018-05-09 LAB — CULTURE, BLOOD (ROUTINE X 2)
CULTURE: NO GROWTH
Culture: NO GROWTH
SPECIAL REQUESTS: ADEQUATE

## 2018-05-14 DIAGNOSIS — R808 Other proteinuria: Secondary | ICD-10-CM | POA: Diagnosis not present

## 2018-05-14 DIAGNOSIS — E1165 Type 2 diabetes mellitus with hyperglycemia: Secondary | ICD-10-CM | POA: Diagnosis not present

## 2018-05-14 DIAGNOSIS — I10 Essential (primary) hypertension: Secondary | ICD-10-CM | POA: Diagnosis not present

## 2018-05-14 DIAGNOSIS — I160 Hypertensive urgency: Secondary | ICD-10-CM | POA: Diagnosis not present

## 2018-05-22 DIAGNOSIS — Z01818 Encounter for other preprocedural examination: Secondary | ICD-10-CM | POA: Diagnosis not present

## 2018-05-22 DIAGNOSIS — Z794 Long term (current) use of insulin: Secondary | ICD-10-CM | POA: Diagnosis not present

## 2018-05-22 DIAGNOSIS — E1165 Type 2 diabetes mellitus with hyperglycemia: Secondary | ICD-10-CM | POA: Diagnosis not present

## 2018-05-22 DIAGNOSIS — N183 Chronic kidney disease, stage 3 (moderate): Secondary | ICD-10-CM | POA: Diagnosis not present

## 2018-05-22 DIAGNOSIS — E1122 Type 2 diabetes mellitus with diabetic chronic kidney disease: Secondary | ICD-10-CM | POA: Diagnosis not present

## 2018-05-29 ENCOUNTER — Ambulatory Visit: Payer: BLUE CROSS/BLUE SHIELD | Admitting: Physician Assistant

## 2018-05-29 NOTE — Progress Notes (Deleted)
Cardiology Office Note   Date:  05/29/2018   ID:  Jacqueline [name], DOB 1980/12/02, MRN [id number]  PCP:  Audley Hose, MD Cardiologist:  Minus Breeding, MD 05/25/2017 Rosaria Ferries, PA-C   No chief complaint on file.   History of Present Illness: Jacqueline [name] is a 37 y.o. female with a history of HTN, HLD, DM, anxiety, CKD III, OSA on CPAP, Afib not on anticoag, morbid obesity, nl cors 2017, non-cardiac CP, EF 65-70% echo 05/2017  Admitted 10/17-10/20/2019 for headache, chest pain, uncontrolled hypertension, meds adjusted, A1c >10, minimal troponin elevation, normal echo, enzyme elevation felt demand ischemia in the setting of hypertensive urgency, Bumex and compression stockings per PCP for chronic lower extremity edema, propranolol changed to carvedilol, start Benicar HCT when renal function stabilizes   Jacqueline [name] presents for ***   Past Medical History:  Diagnosis Date  . Anemia   . Anxiety   . Chronic bronchitis ([place])   . Hypertension   . Increased frequency of headaches   . Morbid obesity ([place])   . Sleep apnea   . Type II diabetes mellitus ([place])     Past Surgical History:  Procedure Laterality Date  . BREAST REDUCTION SURGERY  03/21/2017  . CARDIAC CATHETERIZATION  01/06/2016  . CARDIAC CATHETERIZATION N/A 01/06/2016   Procedure: Left Heart Cath and Coronary Angiography;  Surgeon: Wellington Hampshire, MD;  Location: [facility];  Service: Cardiovascular;  Laterality: N/A;  . CESAREAN SECTION  08/2014    Current Outpatient Medications  Medication Sig Dispense Refill  . amLODipine (NORVASC) 10 MG tablet Take 1 tablet (10 mg total) by mouth daily. 30 tablet 0  . aspirin 325 MG tablet Take 1 tablet (325 mg total) by mouth daily. 90 tablet 0  . atorvastatin (LIPITOR) 10 MG tablet Take 10 mg by mouth every evening.    . benzonatate (TESSALON) 100 MG capsule Take 1 capsule (100 mg total) by mouth every 8 (eight) hours. 21 capsule 0  . bumetanide (BUMEX) 1 MG  tablet Take 1 tablet (1 mg total) by mouth daily. 30 tablet 1  . carvedilol (COREG) 25 MG tablet Take 1 tablet (25 mg total) by mouth 2 (two) times daily with a meal. 60 tablet 0  . DULoxetine (CYMBALTA) 60 MG capsule Take 60 mg by mouth at bedtime.    . metFORMIN (GLUCOPHAGE) 500 MG tablet Take 500 mg by mouth 2 (two) times daily with a meal.     . olmesartan (BENICAR) 20 MG tablet Take 20 mg by mouth daily.    . TRESIBA FLEXTOUCH 200 UNIT/ML SOPN Inject 30 Units into the skin at bedtime.  5   No current facility-administered medications for this visit.     Allergies:   Patient has no known allergies.    Social History:  The patient  reports that she has never smoked. She has never used smokeless tobacco. She reports that she does not drink alcohol or use drugs.   Family History:  The patient's family history includes Diabetes in her maternal grandmother and mother; Heart attack in her unknown relative; Hypertension in her mother; Kidney disease in her maternal grandmother; Thyroid disease in her mother.  She indicated that her mother is alive. She indicated that her sister is alive. She indicated that her maternal grandmother is deceased. She indicated that the status of her unknown relative is unknown.    ROS:  Please see the history of present illness. All other systems are reviewed and  negative.    PHYSICAL EXAM: VS:  There were no vitals taken for this visit. , BMI There is no height or weight on file to calculate BMI. GEN: Well nourished, well developed, female in no acute distress HEENT: normal for age  Neck: no JVD, no carotid bruit, no masses Cardiac: RRR; no murmur, no rubs, or gallops Respiratory:  clear to auscultation bilaterally, normal work of breathing GI: soft, nontender, nondistended, + BS MS: no deformity or atrophy; no edema; distal pulses are 2+ in all 4 extremities  Skin: warm and dry, no rash Neuro:  Strength and sensation are intact Psych: euthymic mood,  full affect   EKG:  EKG {ACTION; IS/IS [id number]} ordered today. The ekg ordered today demonstrates ***  Echocardiogram 06/15/2017: Study Conclusions: - Left ventricle: The cavity size was normal. Wall thickness was increased in a pattern of mild LVH. Systolic function was vigorous. The estimated ejection fraction was in the range of 65% to 70%. Wall motion was normal; there were no regional wall motion abnormalities. The study is not technically sufficient to allow evaluation of LV diastolic function. - Mitral valve: Mildly thickened leaflets . There was trivial regurgitation. - Left atrium: The atrium was normal in size. - Inferior vena cava: The vessel was normal in size. The respirophasic diameter changes were in the normal range (>= 50%), consistent with normal central venous pressure.  Impressions: - LVEF 65-70%, mild LVH, normal wall motion, trivial MR, normal LA size, normal IVC. _______________  Left Heart Catheterization 01/06/2016: Findings: 1. Normal coronary arteries. 2. Left ventricular angiography was not performed. EF was normal by echocardiogram recently. 3. Mildly elevated left ventricular end-diastolic pressure with mild gradient across the LVOT.  Recommendations: The chest pain is likely noncardiac. The patient vomited at the end of the case and was given Zofran.  Discharge home tomorrow if no other issues.   Recent Labs: 05/03/2018: ALT 15; B Natriuretic Peptide 61.4 05/04/2018: TSH 4.173 05/06/2018: BUN 19; Creatinine, Ser 1.79; Hemoglobin 8.5; Platelets 351; Potassium 3.9; Sodium 137  CBC    Component Value Date/Time   WBC 9.5 05/06/2018 0202   RBC 3.21 (L) 05/06/2018 0202   HGB 8.5 (L) 05/06/2018 0202   HCT 26.8 (L) 05/06/2018 0202   PLT 351 05/06/2018 0202   MCV 83.5 05/06/2018 0202   MCH 26.5 05/06/2018 0202   MCHC 31.7 05/06/2018 0202   RDW 13.4 05/06/2018 0202   LYMPHSABS 3.1 05/03/2018 1441   MONOABS 0.7  05/03/2018 1441   EOSABS 0.3 05/03/2018 1441   BASOSABS 0.0 05/03/2018 1441   CMP Latest Ref Rng & Units 05/06/2018 05/05/2018 05/04/2018  Glucose 70 - 99 mg/dL 236(H) 237(H) 199(H)  BUN 6 - 20 mg/dL 19 18 17   Creatinine 0.44 - 1.00 mg/dL 1.79(H) 1.81(H) 1.74(H)  Sodium 135 - 145 mmol/L 137 137 140  Potassium 3.5 - 5.1 mmol/L 3.9 4.0 3.9  Chloride 98 - 111 mmol/L 106 108 110  CO2 22 - 32 mmol/L 25 25 24   Calcium 8.9 - 10.3 mg/dL 8.0(L) 7.7(L) 8.0(L)  Total Protein 6.5 - 8.1 g/dL - - -  Total Bilirubin 0.3 - 1.2 mg/dL - - -  Alkaline Phos 38 - 126 U/L - - -  AST 15 - 41 U/L - - -  ALT 0 - 44 U/L - - -     Lipid Panel    Component Value Date/Time   CHOL 134 05/04/2018 0217   TRIG 166 (H) 05/04/2018 0217   HDL 33 (L) 05/04/2018  0217   CHOLHDL 4.1 05/04/2018 0217   VLDL 33 05/04/2018 0217   LDLCALC 68 05/04/2018 0217     Wt Readings from Last 3 Encounters:  05/03/18 (!) 306 lb 10.6 oz (139.1 kg)  02/14/18 294 lb (133.4 kg)  10/01/17 278 lb (126.1 kg)     Other studies Reviewed: Additional studies/ records that were reviewed today include: ***.  ASSESSMENT AND PLAN:  1.  ***   Current medicines are reviewed at length with the patient today.  The patient {ACTIONS; HAS/DOES NOT HAVE:19233} concerns regarding medicines.  The following changes have been made:  {PLAN; NO CHANGE:13088:s}  Labs/ tests ordered today include: *** No orders of the defined types were placed in this encounter.    Disposition:   FU with Minus Breeding, MD  Signed, Rosaria Ferries, PA-C  05/29/2018 7:55 AM    [place] Phone: [phone]; Fax: [phone]  This note was written with the assistance of speech recognition software.  Please excuse any transcriptional errors.

## 2018-05-31 ENCOUNTER — Encounter: Payer: Self-pay | Admitting: Physician Assistant

## 2018-06-01 DIAGNOSIS — G4733 Obstructive sleep apnea (adult) (pediatric): Secondary | ICD-10-CM | POA: Diagnosis not present

## 2018-06-12 DIAGNOSIS — Z01818 Encounter for other preprocedural examination: Secondary | ICD-10-CM | POA: Diagnosis not present

## 2018-06-20 DIAGNOSIS — D649 Anemia, unspecified: Secondary | ICD-10-CM | POA: Diagnosis not present

## 2018-06-20 DIAGNOSIS — R42 Dizziness and giddiness: Secondary | ICD-10-CM | POA: Diagnosis not present

## 2018-06-20 DIAGNOSIS — D6489 Other specified anemias: Secondary | ICD-10-CM | POA: Diagnosis not present

## 2018-06-20 DIAGNOSIS — N183 Chronic kidney disease, stage 3 (moderate): Secondary | ICD-10-CM | POA: Diagnosis not present

## 2018-06-26 DIAGNOSIS — I10 Essential (primary) hypertension: Secondary | ICD-10-CM | POA: Diagnosis not present

## 2018-06-26 DIAGNOSIS — Z6841 Body Mass Index (BMI) 40.0 and over, adult: Secondary | ICD-10-CM | POA: Diagnosis not present

## 2018-06-26 DIAGNOSIS — E118 Type 2 diabetes mellitus with unspecified complications: Secondary | ICD-10-CM | POA: Diagnosis not present

## 2018-06-26 DIAGNOSIS — Z01818 Encounter for other preprocedural examination: Secondary | ICD-10-CM | POA: Diagnosis not present

## 2018-06-29 ENCOUNTER — Emergency Department (HOSPITAL_BASED_OUTPATIENT_CLINIC_OR_DEPARTMENT_OTHER): Payer: BLUE CROSS/BLUE SHIELD

## 2018-06-29 ENCOUNTER — Other Ambulatory Visit: Payer: Self-pay

## 2018-06-29 ENCOUNTER — Emergency Department (HOSPITAL_BASED_OUTPATIENT_CLINIC_OR_DEPARTMENT_OTHER)
Admission: EM | Admit: 2018-06-29 | Discharge: 2018-06-30 | Disposition: A | Discharge status: Home / Self Care | Payer: BLUE CROSS/BLUE SHIELD | Attending: Emergency Medicine | Admitting: Emergency Medicine

## 2018-06-29 ENCOUNTER — Encounter (HOSPITAL_BASED_OUTPATIENT_CLINIC_OR_DEPARTMENT_OTHER): Payer: Self-pay | Admitting: Emergency Medicine

## 2018-06-29 DIAGNOSIS — E119 Type 2 diabetes mellitus without complications: Secondary | ICD-10-CM | POA: Diagnosis not present

## 2018-06-29 DIAGNOSIS — R0602 Shortness of breath: Secondary | ICD-10-CM | POA: Diagnosis not present

## 2018-06-29 DIAGNOSIS — R0600 Dyspnea, unspecified: Secondary | ICD-10-CM | POA: Diagnosis not present

## 2018-06-29 DIAGNOSIS — N390 Urinary tract infection, site not specified: Secondary | ICD-10-CM | POA: Diagnosis not present

## 2018-06-29 DIAGNOSIS — R42 Dizziness and giddiness: Secondary | ICD-10-CM | POA: Diagnosis not present

## 2018-06-29 DIAGNOSIS — E860 Dehydration: Secondary | ICD-10-CM | POA: Insufficient documentation

## 2018-06-29 DIAGNOSIS — N179 Acute kidney failure, unspecified: Secondary | ICD-10-CM | POA: Diagnosis not present

## 2018-06-29 DIAGNOSIS — I10 Essential (primary) hypertension: Secondary | ICD-10-CM | POA: Diagnosis not present

## 2018-06-29 LAB — COMPREHENSIVE METABOLIC PANEL
ALBUMIN: 2.8 g/dL — AB (ref 3.5–5.0)
ALK PHOS: 70 U/L (ref 38–126)
ALT: 13 U/L (ref 0–44)
ANION GAP: 7 (ref 5–15)
AST: 16 U/L (ref 15–41)
BILIRUBIN TOTAL: 0.3 mg/dL (ref 0.3–1.2)
BUN: 32 mg/dL — ABNORMAL HIGH (ref 6–20)
CALCIUM: 9.4 mg/dL (ref 8.9–10.3)
CO2: 24 mmol/L (ref 22–32)
Chloride: 105 mmol/L (ref 98–111)
Creatinine, Ser: 2.02 mg/dL — ABNORMAL HIGH (ref 0.44–1.00)
GFR calc non Af Amer: 31 mL/min — ABNORMAL LOW (ref >60)
GFR, EST AFRICAN AMERICAN: 35 mL/min — AB (ref >60)
GLUCOSE: 167 mg/dL — AB (ref 70–99)
Potassium: 4 mmol/L (ref 3.5–5.1)
Sodium: 136 mmol/L (ref 135–145)
TOTAL PROTEIN: 7.6 g/dL (ref 6.5–8.1)

## 2018-06-29 LAB — CBC
HEMATOCRIT: 26.6 % — AB (ref 36.0–46.0)
HEMOGLOBIN: 8.3 g/dL — AB (ref 12.0–15.0)
MCH: 26.5 pg (ref 26.0–34.0)
MCHC: 31.2 g/dL (ref 30.0–36.0)
MCV: 85 fL (ref 80.0–100.0)
Platelets: 340 10*3/uL (ref 150–400)
RBC: 3.13 MIL/uL — ABNORMAL LOW (ref 3.87–5.11)
RDW: 14 % (ref 11.5–15.5)
WBC: 12.8 10*3/uL — ABNORMAL HIGH (ref 4.0–10.5)
nRBC: 0 % (ref 0.0–0.2)

## 2018-06-29 LAB — LIPASE, BLOOD: Lipase: 35 U/L (ref 11–51)

## 2018-06-29 LAB — TROPONIN I: Troponin I: <0.03 ng/mL (ref <0.03)

## 2018-06-29 LAB — BRAIN NATRIURETIC PEPTIDE: B NATRIURETIC PEPTIDE 5: 52.1 pg/mL (ref 0.0–100.0)

## 2018-06-29 MED ORDER — SODIUM CHLORIDE 0.9 % IV BOLUS
1000.0000 mL | Freq: Once | INTRAVENOUS | Status: AC
  Administered 2018-06-29: 1000 mL via INTRAVENOUS

## 2018-06-29 MED ORDER — OXYCODONE HCL 5 MG PO TABS
5.0000 mg | ORAL_TABLET | Freq: Once | ORAL | Status: AC
  Administered 2018-06-29: 5 mg via ORAL
  Filled 2018-06-29: qty 1

## 2018-06-29 MED ORDER — ONDANSETRON HCL 4 MG/2ML IJ SOLN
4.0000 mg | Freq: Once | INTRAMUSCULAR | Status: AC
  Administered 2018-06-29: 4 mg via INTRAVENOUS
  Filled 2018-06-29: qty 2

## 2018-06-29 NOTE — ED Notes (Signed)
RN/IV

## 2018-06-29 NOTE — ED Triage Notes (Signed)
Pt c/o bilateral leg swollen, back pain, SOB and dizziness, pt states her hemoglobin is low and she is having nausea and vomiting.

## 2018-06-29 NOTE — ED Provider Notes (Signed)
[facility] Emergency Department Provider Note MRN:  [id number]  [place] date & time: 06/30/18     Chief Complaint   multiple complains   History of Present Illness   Jacqueline [name] is a 37 y.o. year-old female with a history of hypertension, diabetes, obesity presenting to the ED with chief complaint of multiple complaints.  Patient is noticed increased leg swelling for 1 week.  Also endorsing dyspnea on exertion, lightheadedness when she stands from a seated position.  Explains that she is being evaluated for bariatric surgery, to have been measuring her hemoglobin levels, which are decreasing.  She is also told that her kidney function is worsening.  Endorsing nausea and vomiting today.  Normal bowel movements today, no fever, no headache or vision change, general malaise.  Review of Systems  A complete 10 system review of systems was obtained and all systems are negative except as noted in the HPI and PMH.   Patient's Health History    Past Medical History:  Diagnosis Date  . Anemia   . Anxiety   . Chronic bronchitis ([place])   . Hypertension   . Increased frequency of headaches   . Morbid obesity ([place])   . Sleep apnea   . Type II diabetes mellitus ([place])     Past Surgical History:  Procedure Laterality Date  . BREAST REDUCTION SURGERY  03/21/2017  . CARDIAC CATHETERIZATION  01/06/2016  . CARDIAC CATHETERIZATION N/A 01/06/2016   Procedure: Left Heart Cath and Coronary Angiography;  Surgeon: Wellington Hampshire, MD;  Location: [facility];  Service: Cardiovascular;  Laterality: N/A;  . CESAREAN SECTION  08/2014    Family History  Problem Relation Age of Onset  . Diabetes Mother   . Hypertension Mother   . Thyroid disease Mother   . Kidney disease Maternal Grandmother   . Diabetes Maternal Grandmother   . Heart attack Other     Social History   Socioeconomic History  . Marital status: Married    Spouse name: Not on file  . Number of  children: Not on file  . Years of education: Not on file  . Highest education level: Not on file  Occupational History  . Not on file  Social Needs  . Financial resource strain: Not on file  . Food insecurity:    Worry: Not on file    Inability: Not on file  . Transportation needs:    Medical: Not on file    Non-medical: Not on file  Tobacco Use  . Smoking status: Never Smoker  . Smokeless tobacco: Never Used  Substance and Sexual Activity  . Alcohol use: No  . Drug use: No  . Sexual activity: Yes    Birth control/protection: None  Lifestyle  . Physical activity:    Days per week: Not on file    Minutes per session: Not on file  . Stress: Not on file  Relationships  . Social connections:    Talks on phone: Not on file    Gets together: Not on file    Attends religious service: Not on file    Active member of club or organization: Not on file    Attends meetings of clubs or organizations: Not on file    Relationship status: Not on file  . Intimate partner violence:    Fear of current or ex partner: Not on file    Emotionally abused: Not on file    Physically abused: Not on file  Forced sexual activity: Not on file  Other Topics Concern  . Not on file  Social History Narrative  . Not on file     Physical Exam  Vital Signs and Nursing Notes reviewed Vitals:   06/29/18 2208  BP: (!) 192/98  Pulse: (!) 113  Resp: 20  Temp: 99.3 F (37.4 C)  SpO2: 100%    CONSTITUTIONAL: Well-appearing, NAD NEURO:  Alert and oriented x 3, no focal deficits EYES:  eyes equal and reactive ENT/NECK:  no LAD, no JVD CARDIO: Tachycardic rate, well-perfused, normal S1 and S2 PULM:  CTAB no wheezing or rhonchi GI/GU:  normal bowel sounds, non-distended, non-tender MSK/SPINE:  No gross deformities, 1+ pitting edema bilateral lower extremities SKIN:  no rash, atraumatic PSYCH:  Appropriate speech and behavior  Diagnostic and Interventional Summary    EKG  Interpretation  Date/Time:  Friday June 29 2018 22:33:59 EST Ventricular Rate:  110 PR Interval:    QRS Duration: 86 QT Interval:  344 QTC Calculation: 466 R Axis:   71 Text Interpretation:  Sinus tachycardia Baseline wander in lead(s) V1 V6 Confirmed by Gerlene Fee [phone]) on 06/29/2018 10:54:23 PM      Labs Reviewed  CBC - Abnormal; Notable for the following components:      Result Value   WBC 12.8 (*)    RBC 3.13 (*)    Hemoglobin 8.3 (*)    HCT 26.6 (*)    All other components within normal limits  COMPREHENSIVE METABOLIC PANEL - Abnormal; Notable for the following components:   Glucose, Bld 167 (*)    BUN 32 (*)    Creatinine, Ser 2.02 (*)    Albumin 2.8 (*)    GFR calc non Af Amer 31 (*)    GFR calc Af Amer 35 (*)    All other components within normal limits  LIPASE, BLOOD  TROPONIN I  BRAIN NATRIURETIC PEPTIDE  URINALYSIS, ROUTINE W REFLEX MICROSCOPIC  PREGNANCY, URINE    DG Chest 2 View  Final Result      Medications  sodium chloride 0.9 % bolus 1,000 mL (1,000 mLs Intravenous New Bag/Given 06/29/18 2359)  ondansetron (ZOFRAN) injection 4 mg (4 mg Intravenous Given 06/29/18 2253)  oxyCODONE (Oxy IR/ROXICODONE) immediate release tablet 5 mg (5 mg Oral Given 06/29/18 2236)     Procedures Critical Care  ED Course and Medical Decision Making  I have reviewed the triage vital signs and the nursing notes.  Pertinent labs & imaging results that were available during my care of the patient were reviewed by me and considered in my medical decision making (see below for details).  Considering symptomatic anemia in this 37 year old female with known decreasing hemoglobin levels, dizziness with standing, dyspnea on exertion.  Also considering metabolic disarray, dehydration, viral illness.  Work-up pending.  Hemoglobin is stable at 8.3.  Labs reveal mild AKI, favored to be due to dehydration, likely related to viral process.  Patient admits to sore throat  today as well as vomiting.  Patient shortness of breath is not acute, there is no hypoxia, leg swelling is symmetric, there is no chest pain.  Little to no concern for PE.  Will provide 1 L normal saline, continue symptomatic management, follow-up urine studies, likely discharge with close PCP follow-up for lab recheck.  Signed out to Dr. Florina Ou at shift change.  Barth Kirks. Sedonia Small, MD [place] [email or web address]  Final Clinical Impressions(s) / ED Diagnoses     ICD-10-CM  1. Dehydration E86.0   2. SOB (shortness of breath) R06.02 DG Chest 2 View    DG Chest 2 View  3. AKI (acute kidney injury) [facility]) N17.9     ED Discharge Orders    None         Maudie Flakes, MD 06/30/18 0001

## 2018-06-29 NOTE — ED Notes (Signed)
Patient transported to X-ray 

## 2018-06-29 NOTE — Discharge Instructions (Signed)
You were evaluated in the Emergency Department and after careful evaluation, we did not find any emergent condition requiring admission or further testing in the hospital.  Your symptoms today seem to be due to dehydration.  This is called a small worsening of your kidney function.  It is very important that you follow-up with your doctors as soon as possible for recheck of your kidney function.  Please return to the Emergency Department if you experience any worsening of your condition.  We encourage you to follow up with a primary care provider.  Thank you for allowing [place] to be a part of your care.

## 2018-06-29 NOTE — ED Notes (Signed)
Pt. Reports she has had nausea and vomiting today with swelling in her legs from top to bottom.  Pt. Reports feeling weak and tired.  Pt. Stated she had a phone call by her Surgeon today letting her know that her HGB was low around 7.

## 2018-06-30 LAB — PREGNANCY, URINE: Preg Test, Ur: NEGATIVE

## 2018-06-30 LAB — URINALYSIS, ROUTINE W REFLEX MICROSCOPIC
BILIRUBIN URINE: NEGATIVE
Glucose, UA: 250 mg/dL — AB
KETONES UR: NEGATIVE mg/dL
NITRITE: NEGATIVE
PH: 6 (ref 5.0–8.0)
Protein, ur: >300 mg/dL — AB
Specific Gravity, Urine: 1.015 (ref 1.005–1.030)

## 2018-06-30 LAB — URINALYSIS, MICROSCOPIC (REFLEX)

## 2018-06-30 MED ORDER — CEPHALEXIN 250 MG PO CAPS
500.0000 mg | ORAL_CAPSULE | Freq: Once | ORAL | Status: AC
  Administered 2018-06-30: 500 mg via ORAL
  Filled 2018-06-30: qty 2

## 2018-06-30 MED ORDER — ONDANSETRON 4 MG PO TBDP: 4.0000 mg | ORAL_TABLET | Freq: Three times a day (TID) | ORAL | 0 refills | Status: DC | PRN

## 2018-06-30 MED ORDER — CEPHALEXIN 500 MG PO CAPS: 500.0000 mg | ORAL_CAPSULE | Freq: Two times a day (BID) | ORAL | 0 refills | Status: DC

## 2018-07-01 LAB — URINE CULTURE: Culture: <10000 — AB

## 2018-07-02 ENCOUNTER — Encounter (HOSPITAL_BASED_OUTPATIENT_CLINIC_OR_DEPARTMENT_OTHER): Payer: Self-pay | Admitting: *Deleted

## 2018-07-02 ENCOUNTER — Other Ambulatory Visit: Payer: Self-pay

## 2018-07-02 ENCOUNTER — Inpatient Hospital Stay (HOSPITAL_BASED_OUTPATIENT_CLINIC_OR_DEPARTMENT_OTHER)
Admission: EM | Admit: 2018-07-02 | Discharge: 2018-07-06 | DRG: 291 | Disposition: A | Discharge status: Home / Self Care | Payer: BLUE CROSS/BLUE SHIELD | Attending: Internal Medicine | Admitting: Internal Medicine

## 2018-07-02 ENCOUNTER — Emergency Department (HOSPITAL_BASED_OUTPATIENT_CLINIC_OR_DEPARTMENT_OTHER): Payer: BLUE CROSS/BLUE SHIELD

## 2018-07-02 DIAGNOSIS — N179 Acute kidney failure, unspecified: Secondary | ICD-10-CM | POA: Diagnosis not present

## 2018-07-02 DIAGNOSIS — D638 Anemia in other chronic diseases classified elsewhere: Secondary | ICD-10-CM

## 2018-07-02 DIAGNOSIS — I480 Paroxysmal atrial fibrillation: Secondary | ICD-10-CM | POA: Diagnosis present

## 2018-07-02 DIAGNOSIS — Z79899 Other long term (current) drug therapy: Secondary | ICD-10-CM

## 2018-07-02 DIAGNOSIS — E119 Type 2 diabetes mellitus without complications: Secondary | ICD-10-CM | POA: Diagnosis not present

## 2018-07-02 DIAGNOSIS — E66813 Obesity, class 3: Secondary | ICD-10-CM

## 2018-07-02 DIAGNOSIS — R000 Tachycardia, unspecified: Secondary | ICD-10-CM | POA: Diagnosis not present

## 2018-07-02 DIAGNOSIS — D631 Anemia in chronic kidney disease: Secondary | ICD-10-CM | POA: Diagnosis present

## 2018-07-02 DIAGNOSIS — N183 Chronic kidney disease, stage 3 unspecified: Secondary | ICD-10-CM | POA: Diagnosis present

## 2018-07-02 DIAGNOSIS — G4733 Obstructive sleep apnea (adult) (pediatric): Secondary | ICD-10-CM | POA: Diagnosis present

## 2018-07-02 DIAGNOSIS — Z8349 Family history of other endocrine, nutritional and metabolic diseases: Secondary | ICD-10-CM | POA: Diagnosis not present

## 2018-07-02 DIAGNOSIS — F419 Anxiety disorder, unspecified: Secondary | ICD-10-CM | POA: Diagnosis present

## 2018-07-02 DIAGNOSIS — Z6841 Body Mass Index (BMI) 40.0 and over, adult: Secondary | ICD-10-CM | POA: Diagnosis not present

## 2018-07-02 DIAGNOSIS — Z8249 Family history of ischemic heart disease and other diseases of the circulatory system: Secondary | ICD-10-CM

## 2018-07-02 DIAGNOSIS — R600 Localized edema: Secondary | ICD-10-CM | POA: Diagnosis not present

## 2018-07-02 DIAGNOSIS — I130 Hypertensive heart and chronic kidney disease with heart failure and stage 1 through stage 4 chronic kidney disease, or unspecified chronic kidney disease: Secondary | ICD-10-CM | POA: Diagnosis not present

## 2018-07-02 DIAGNOSIS — Z7982 Long term (current) use of aspirin: Secondary | ICD-10-CM

## 2018-07-02 DIAGNOSIS — D649 Anemia, unspecified: Secondary | ICD-10-CM | POA: Diagnosis not present

## 2018-07-02 DIAGNOSIS — E1122 Type 2 diabetes mellitus with diabetic chronic kidney disease: Secondary | ICD-10-CM | POA: Diagnosis not present

## 2018-07-02 DIAGNOSIS — E1165 Type 2 diabetes mellitus with hyperglycemia: Secondary | ICD-10-CM | POA: Diagnosis not present

## 2018-07-02 DIAGNOSIS — R0602 Shortness of breath: Secondary | ICD-10-CM | POA: Diagnosis not present

## 2018-07-02 DIAGNOSIS — L03115 Cellulitis of right lower limb: Secondary | ICD-10-CM | POA: Diagnosis not present

## 2018-07-02 DIAGNOSIS — D509 Iron deficiency anemia, unspecified: Secondary | ICD-10-CM | POA: Diagnosis present

## 2018-07-02 DIAGNOSIS — I5033 Acute on chronic diastolic (congestive) heart failure: Secondary | ICD-10-CM | POA: Diagnosis not present

## 2018-07-02 DIAGNOSIS — Z794 Long term (current) use of insulin: Secondary | ICD-10-CM | POA: Diagnosis not present

## 2018-07-02 DIAGNOSIS — R609 Edema, unspecified: Secondary | ICD-10-CM | POA: Diagnosis not present

## 2018-07-02 DIAGNOSIS — F32A Depression, unspecified: Secondary | ICD-10-CM | POA: Diagnosis present

## 2018-07-02 DIAGNOSIS — Z833 Family history of diabetes mellitus: Secondary | ICD-10-CM | POA: Diagnosis not present

## 2018-07-02 DIAGNOSIS — E1129 Type 2 diabetes mellitus with other diabetic kidney complication: Secondary | ICD-10-CM | POA: Diagnosis present

## 2018-07-02 DIAGNOSIS — J810 Acute pulmonary edema: Secondary | ICD-10-CM

## 2018-07-02 DIAGNOSIS — E785 Hyperlipidemia, unspecified: Secondary | ICD-10-CM | POA: Diagnosis not present

## 2018-07-02 DIAGNOSIS — IMO0001 Reserved for inherently not codable concepts without codable children: Secondary | ICD-10-CM

## 2018-07-02 DIAGNOSIS — Z23 Encounter for immunization: Secondary | ICD-10-CM

## 2018-07-02 DIAGNOSIS — I160 Hypertensive urgency: Secondary | ICD-10-CM | POA: Diagnosis not present

## 2018-07-02 DIAGNOSIS — F329 Major depressive disorder, single episode, unspecified: Secondary | ICD-10-CM | POA: Diagnosis not present

## 2018-07-02 DIAGNOSIS — N185 Chronic kidney disease, stage 5: Secondary | ICD-10-CM

## 2018-07-02 LAB — GLUCOSE, CAPILLARY
Glucose-Capillary: 175 mg/dL — ABNORMAL HIGH (ref 70–99)
Glucose-Capillary: 146 mg/dL — ABNORMAL HIGH (ref 70–99)
Glucose-Capillary: 133 mg/dL — ABNORMAL HIGH (ref 70–99)
Glucose-Capillary: 192 mg/dL — ABNORMAL HIGH (ref 70–99)

## 2018-07-02 LAB — CBC WITH DIFFERENTIAL/PLATELET
Abs Immature Granulocytes: 0.04 10*3/uL (ref 0.00–0.07)
BASOS ABS: 0 10*3/uL (ref 0.0–0.1)
BASOS PCT: 0 %
EOS PCT: 4 %
Eosinophils Absolute: 0.5 10*3/uL (ref 0.0–0.5)
HCT: 23.2 % — ABNORMAL LOW (ref 36.0–46.0)
Hemoglobin: 7.4 g/dL — ABNORMAL LOW (ref 12.0–15.0)
Immature Granulocytes: 0 %
Lymphocytes Relative: 25 %
Lymphs Abs: 3.2 10*3/uL (ref 0.7–4.0)
MCH: 26.9 pg (ref 26.0–34.0)
MCHC: 31.9 g/dL (ref 30.0–36.0)
MCV: 84.4 fL (ref 80.0–100.0)
Monocytes Absolute: 0.8 10*3/uL (ref 0.1–1.0)
Monocytes Relative: 6 %
Neutro Abs: 8.1 10*3/uL — ABNORMAL HIGH (ref 1.7–7.7)
Neutrophils Relative %: 65 %
PLATELETS: 306 10*3/uL (ref 150–400)
RBC: 2.75 MIL/uL — ABNORMAL LOW (ref 3.87–5.11)
RDW: 14.1 % (ref 11.5–15.5)
WBC: 12.6 10*3/uL — ABNORMAL HIGH (ref 4.0–10.5)
nRBC: 0 % (ref 0.0–0.2)

## 2018-07-02 LAB — FERRITIN: Ferritin: 55 ng/mL (ref 11–307)

## 2018-07-02 LAB — COMPREHENSIVE METABOLIC PANEL
ALT: 13 U/L (ref 0–44)
AST: 18 U/L (ref 15–41)
Albumin: 2.5 g/dL — ABNORMAL LOW (ref 3.5–5.0)
Alkaline Phosphatase: 63 U/L (ref 38–126)
Anion gap: 7 (ref 5–15)
BUN: 31 mg/dL — ABNORMAL HIGH (ref 6–20)
CO2: 22 mmol/L (ref 22–32)
Calcium: 8.1 mg/dL — ABNORMAL LOW (ref 8.9–10.3)
Chloride: 107 mmol/L (ref 98–111)
Creatinine, Ser: 2 mg/dL — ABNORMAL HIGH (ref 0.44–1.00)
GFR calc Af Amer: 36 mL/min — ABNORMAL LOW (ref >60)
GFR calc non Af Amer: 31 mL/min — ABNORMAL LOW (ref >60)
Glucose, Bld: 234 mg/dL — ABNORMAL HIGH (ref 70–99)
Potassium: 3.7 mmol/L (ref 3.5–5.1)
Sodium: 136 mmol/L (ref 135–145)
Total Bilirubin: 0.3 mg/dL (ref 0.3–1.2)
Total Protein: 6.3 g/dL — ABNORMAL LOW (ref 6.5–8.1)

## 2018-07-02 LAB — IRON AND TIBC
IRON: 22 ug/dL — AB (ref 28–170)
Saturation Ratios: 11 % (ref 10.4–31.8)
TIBC: 196 ug/dL — ABNORMAL LOW (ref 250–450)
UIBC: 174 ug/dL

## 2018-07-02 LAB — TROPONIN I

## 2018-07-02 LAB — CBC
HCT: 23.3 % — ABNORMAL LOW (ref 36.0–46.0)
Hemoglobin: 7.1 g/dL — ABNORMAL LOW (ref 12.0–15.0)
MCH: 26.1 pg (ref 26.0–34.0)
MCHC: 30.5 g/dL (ref 30.0–36.0)
MCV: 85.7 fL (ref 80.0–100.0)
Platelets: 342 10*3/uL (ref 150–400)
RBC: 2.72 MIL/uL — ABNORMAL LOW (ref 3.87–5.11)
RDW: 14.1 % (ref 11.5–15.5)
WBC: 11.4 10*3/uL — ABNORMAL HIGH (ref 4.0–10.5)
nRBC: 0 % (ref 0.0–0.2)

## 2018-07-02 LAB — ABO/RH: ABO/RH(D): B POS

## 2018-07-02 LAB — CREATININE, SERUM
Creatinine, Ser: 2.05 mg/dL — ABNORMAL HIGH (ref 0.44–1.00)
GFR calc Af Amer: 35 mL/min — ABNORMAL LOW (ref >60)
GFR calc non Af Amer: 30 mL/min — ABNORMAL LOW (ref >60)

## 2018-07-02 LAB — BRAIN NATRIURETIC PEPTIDE: B Natriuretic Peptide: 66.9 pg/mL (ref 0.0–100.0)

## 2018-07-02 LAB — PREGNANCY, URINE: Preg Test, Ur: NEGATIVE

## 2018-07-02 MED ORDER — ZOLPIDEM TARTRATE 5 MG PO TABS
5.0000 mg | ORAL_TABLET | Freq: Every evening | ORAL | Status: DC | PRN
  Administered 2018-07-05: 5 mg via ORAL
  Filled 2018-07-02: qty 1

## 2018-07-02 MED ORDER — ISOSORB DINITRATE-HYDRALAZINE 20-37.5 MG PO TABS
1.0000 | ORAL_TABLET | Freq: Three times a day (TID) | ORAL | Status: DC
  Administered 2018-07-02 – 2018-07-03 (×5): 1 via ORAL
  Filled 2018-07-02 (×6): qty 1

## 2018-07-02 MED ORDER — INSULIN ASPART 100 UNIT/ML ~~LOC~~ SOLN
0.0000 [IU] | Freq: Three times a day (TID) | SUBCUTANEOUS | Status: DC
  Administered 2018-07-02: 2 [IU] via SUBCUTANEOUS
  Administered 2018-07-02: 3 [IU] via SUBCUTANEOUS
  Administered 2018-07-02 – 2018-07-03 (×2): 2 [IU] via SUBCUTANEOUS
  Administered 2018-07-03: 5 [IU] via SUBCUTANEOUS
  Administered 2018-07-03: 3 [IU] via SUBCUTANEOUS
  Administered 2018-07-05 (×2): 2 [IU] via SUBCUTANEOUS
  Administered 2018-07-06: 3 [IU] via SUBCUTANEOUS

## 2018-07-02 MED ORDER — MECLIZINE HCL 25 MG PO TABS: 25.0000 mg | ORAL_TABLET | Freq: Two times a day (BID) | ORAL | Status: DC | PRN

## 2018-07-02 MED ORDER — ACETAMINOPHEN 325 MG PO TABS
650.0000 mg | ORAL_TABLET | Freq: Four times a day (QID) | ORAL | Status: DC | PRN
  Administered 2018-07-02 – 2018-07-04 (×2): 650 mg via ORAL
  Filled 2018-07-02 (×2): qty 2

## 2018-07-02 MED ORDER — ASPIRIN 325 MG PO TABS
325.0000 mg | ORAL_TABLET | Freq: Every day | ORAL | Status: DC
  Administered 2018-07-02 – 2018-07-06 (×5): 325 mg via ORAL
  Filled 2018-07-02 (×5): qty 1

## 2018-07-02 MED ORDER — NITROGLYCERIN 2 % TD OINT
1.0000 [in_us] | TOPICAL_OINTMENT | Freq: Once | TRANSDERMAL | Status: AC
  Administered 2018-07-02: 1 [in_us] via TOPICAL
  Filled 2018-07-02: qty 1

## 2018-07-02 MED ORDER — ACETAMINOPHEN 325 MG PO TABS: 650.0000 mg | ORAL_TABLET | Freq: Four times a day (QID) | ORAL | Status: DC | PRN

## 2018-07-02 MED ORDER — INSULIN DEGLUDEC 100 UNIT/ML ~~LOC~~ SOPN: 60.0000 [IU] | PEN_INJECTOR | Freq: Every day | SUBCUTANEOUS | Status: DC

## 2018-07-02 MED ORDER — DOXYCYCLINE HYCLATE 100 MG PO TABS
100.0000 mg | ORAL_TABLET | Freq: Two times a day (BID) | ORAL | Status: DC
  Administered 2018-07-02 – 2018-07-06 (×8): 100 mg via ORAL
  Filled 2018-07-02 (×9): qty 1

## 2018-07-02 MED ORDER — ONDANSETRON HCL 4 MG/2ML IJ SOLN: 4.0000 mg | Freq: Three times a day (TID) | INTRAMUSCULAR | Status: DC | PRN

## 2018-07-02 MED ORDER — FUROSEMIDE 10 MG/ML IJ SOLN
80.0000 mg | Freq: Two times a day (BID) | INTRAMUSCULAR | Status: DC
  Administered 2018-07-02 – 2018-07-06 (×9): 80 mg via INTRAVENOUS
  Filled 2018-07-02 (×9): qty 8

## 2018-07-02 MED ORDER — PNEUMOCOCCAL VAC POLYVALENT 25 MCG/0.5ML IJ INJ
0.5000 mL | INJECTION | INTRAMUSCULAR | Status: AC
  Administered 2018-07-03: 0.5 mL via INTRAMUSCULAR
  Filled 2018-07-02: qty 0.5

## 2018-07-02 MED ORDER — AMLODIPINE BESYLATE 10 MG PO TABS
10.0000 mg | ORAL_TABLET | Freq: Every day | ORAL | Status: DC
  Administered 2018-07-03: 10 mg via ORAL
  Filled 2018-07-02: qty 1

## 2018-07-02 MED ORDER — ONDANSETRON HCL 4 MG PO TABS: 4.0000 mg | ORAL_TABLET | Freq: Four times a day (QID) | ORAL | Status: DC | PRN

## 2018-07-02 MED ORDER — ONDANSETRON HCL 4 MG/2ML IJ SOLN: 4.0000 mg | Freq: Four times a day (QID) | INTRAMUSCULAR | Status: DC | PRN

## 2018-07-02 MED ORDER — CARVEDILOL 25 MG PO TABS
25.0000 mg | ORAL_TABLET | Freq: Two times a day (BID) | ORAL | Status: DC
  Administered 2018-07-02 – 2018-07-06 (×9): 25 mg via ORAL
  Filled 2018-07-02 (×9): qty 1

## 2018-07-02 MED ORDER — ALBUTEROL SULFATE (2.5 MG/3ML) 0.083% IN NEBU: 2.5000 mg | INHALATION_SOLUTION | Freq: Four times a day (QID) | RESPIRATORY_TRACT | Status: DC | PRN

## 2018-07-02 MED ORDER — OXYCODONE-ACETAMINOPHEN 5-325 MG PO TABS
1.0000 | ORAL_TABLET | Freq: Once | ORAL | Status: AC
  Administered 2018-07-02: 1 via ORAL
  Filled 2018-07-02: qty 1

## 2018-07-02 MED ORDER — LEVALBUTEROL HCL 1.25 MG/0.5ML IN NEBU: 1.2500 mg | INHALATION_SOLUTION | Freq: Four times a day (QID) | RESPIRATORY_TRACT | Status: DC

## 2018-07-02 MED ORDER — ATORVASTATIN CALCIUM 10 MG PO TABS
10.0000 mg | ORAL_TABLET | Freq: Every evening | ORAL | Status: DC
  Administered 2018-07-02 – 2018-07-05 (×4): 10 mg via ORAL
  Filled 2018-07-02 (×4): qty 1

## 2018-07-02 MED ORDER — DULOXETINE HCL 60 MG PO CPEP
60.0000 mg | ORAL_CAPSULE | Freq: Every day | ORAL | Status: DC
  Administered 2018-07-02 – 2018-07-05 (×4): 60 mg via ORAL
  Filled 2018-07-02 (×4): qty 1

## 2018-07-02 MED ORDER — LEVALBUTEROL HCL 1.25 MG/0.5ML IN NEBU
1.2500 mg | INHALATION_SOLUTION | Freq: Four times a day (QID) | RESPIRATORY_TRACT | Status: DC
  Administered 2018-07-02 (×3): 1.25 mg via RESPIRATORY_TRACT
  Filled 2018-07-02 (×3): qty 0.5

## 2018-07-02 MED ORDER — ACETAMINOPHEN 650 MG RE SUPP: 650.0000 mg | Freq: Four times a day (QID) | RECTAL | Status: DC | PRN

## 2018-07-02 MED ORDER — DM-GUAIFENESIN ER 30-600 MG PO TB12: 1.0000 | ORAL_TABLET | Freq: Two times a day (BID) | ORAL | Status: DC | PRN

## 2018-07-02 MED ORDER — INSULIN GLARGINE 100 UNIT/ML ~~LOC~~ SOLN
60.0000 [IU] | Freq: Every day | SUBCUTANEOUS | Status: DC
  Administered 2018-07-02 – 2018-07-03 (×2): 60 [IU] via SUBCUTANEOUS
  Filled 2018-07-02 (×3): qty 0.6

## 2018-07-02 MED ORDER — FUROSEMIDE 10 MG/ML IJ SOLN
80.0000 mg | Freq: Once | INTRAMUSCULAR | Status: AC
  Administered 2018-07-02: 80 mg via INTRAVENOUS
  Filled 2018-07-02: qty 8

## 2018-07-02 MED ORDER — INSULIN ASPART 100 UNIT/ML ~~LOC~~ SOLN: 0.0000 [IU] | Freq: Every day | SUBCUTANEOUS | Status: DC

## 2018-07-02 MED ORDER — ENOXAPARIN SODIUM 40 MG/0.4ML ~~LOC~~ SOLN
40.0000 mg | SUBCUTANEOUS | Status: DC
  Administered 2018-07-02 – 2018-07-05 (×4): 40 mg via SUBCUTANEOUS
  Filled 2018-07-02 (×4): qty 0.4

## 2018-07-02 MED ORDER — HYDRALAZINE HCL 20 MG/ML IJ SOLN: 5.0000 mg | INTRAMUSCULAR | Status: DC | PRN

## 2018-07-02 MED ORDER — AMLODIPINE BESYLATE 5 MG PO TABS
10.0000 mg | ORAL_TABLET | Freq: Once | ORAL | Status: AC
  Administered 2018-07-02: 10 mg via ORAL
  Filled 2018-07-02: qty 2

## 2018-07-02 MED ORDER — DOXYCYCLINE HYCLATE 100 MG PO TABS
100.0000 mg | ORAL_TABLET | Freq: Once | ORAL | Status: AC
  Administered 2018-07-02: 100 mg via ORAL
  Filled 2018-07-02: qty 1

## 2018-07-02 MED ORDER — OXYCODONE-ACETAMINOPHEN 5-325 MG PO TABS
1.0000 | ORAL_TABLET | ORAL | Status: DC | PRN
  Administered 2018-07-02 – 2018-07-05 (×10): 1 via ORAL
  Filled 2018-07-02 (×10): qty 1

## 2018-07-02 NOTE — ED Notes (Signed)
Returned from [facility] at this time, ambulatory to bathroom.

## 2018-07-02 NOTE — H&P (Signed)
TRH H&P   Patient Demographics:    Jacqueline [name], is a 37 y.o. female  MRN: [id number]   DOB - 1981/03/02  Admit Date - 07/02/2018  Outpatient Primary MD for the patient is Audley Hose, MD  Referring MD: Dr Dina Rich  Outpatient Specialists: Dr Rodolph Bong ( cardiology)  Patient coming from: home  Chief Complaint  Patient presents with  . Leg Pain      HPI:    Jacqueline [name]  is a 37 y.o. female, with history of hypertension, uncontrolled diabetes mellitus on insulin (last A1c >10), normocytic anemia, obstructive sleep apnea, morbid obesity, chronic kidney disease stage III (baseline creatinine around 1.4-1.5), A. fib not on anticoagulants, diastolic CHF who presented to the [facility] ED with 3 days history of right lower leg erythema and pain with bilateral leg swellings.  Patient also reports increasing shortness of breath for past 1 week.  Denies any sick contacts (except for her 77-year-old daughter who had URI symptoms recently), denies headache, dizziness, blurred vision, nausea, vomiting, chest pain, palpitations, orthopnea or PND.  Denies any abdominal pain, dysuria, diarrhea, melena or bright red blood per rectum.  Denies use of NSAIDs.  She was seen in the ED 3 days back for leg swellings, dyspnea and dizziness, given 1 L IV normal saline bolus and was also prescribed Keflex (I think this was for suspected UTI based on UA).  Course in the ED Patient had low-grade fever of 99.2 F, tachycardic in low 100s, tachypneic, hypertensive with blood pressure of 2 1 5/98 mmHg and maintaining sats on room air. Blood work showed WBC of 12 point 6K, hemoglobin of 7.4 (dropped almost 2 g from her baseline), platelets 306, chemistry showed normal sodium and potassium, BUN of 31 and creatinine of 2 (elevated from baseline around 1.5), glucose of 234.  BNP was normal. Chest x-ray  done showed progressive peribronchial thickening seen compared to 3 days back with findings of acute bronchitis versus pulmonary edema. Patient given amlodipine, nitroglycerin patch, IV Lasix 80 mg x 1, 1 dose of doxycycline and oxycodone.  Hospitalist consulted for admission to telemetry for acute on chronic diastolic CHF, hypertensive urgency and right lower leg cellulitis.    Review of systems:    In addition to the HPI above,  No fevers, chills + No Headache, No changes with Vision or hearing, No problems swallowing food or Liquids, No chest pain, no cough, shortness of breath + + No Abdominal pain, No Nausea or vomiting, Bowel movements are regular, No Blood in stool or Urine, No dysuria, Redness and pain over right lower leg, bilateral leg swellings No new joints pains-aches,  No new weakness, tingling, numbness in any extremity, No recent weight gain or loss, No polyuria, polydypsia or polyphagia, No significant Mental Stressors.     With Past History of the following :  Past Medical History:  Diagnosis Date  . Anemia   . Anxiety   . Chronic bronchitis ([place])   . Hypertension   . Increased frequency of headaches   . Morbid obesity ([place])   . Sleep apnea   . Type II diabetes mellitus ([place])       Past Surgical History:  Procedure Laterality Date  . BREAST REDUCTION SURGERY  03/21/2017  . CARDIAC CATHETERIZATION  01/06/2016  . CARDIAC CATHETERIZATION N/A 01/06/2016   Procedure: Left Heart Cath and Coronary Angiography;  Surgeon: Wellington Hampshire, MD;  Location: [facility];  Service: Cardiovascular;  Laterality: N/A;  . CESAREAN SECTION  08/2014      Social History:     Social History   Tobacco Use  . Smoking status: Never Smoker  . Smokeless tobacco: Never Used  Substance Use Topics  . Alcohol use: No     Lives -home  Mobility -independent   Family History :     Family History  Problem Relation Age of Onset  . Diabetes Mother   .  Hypertension Mother   . Thyroid disease Mother   . Kidney disease Maternal Grandmother   . Diabetes Maternal Grandmother   . Heart attack Other       Home Medications:   Prior to Admission medications   Medication Sig Start Date End Date Taking? Authorizing Provider  amLODipine (NORVASC) 10 MG tablet Take 1 tablet (10 mg total) by mouth daily. 05/07/18  Yes Lady Deutscher, MD  atorvastatin (LIPITOR) 10 MG tablet Take 10 mg by mouth every evening. 03/08/18  Yes [provider]  bumetanide (BUMEX) 1 MG tablet Take 1 tablet (1 mg total) by mouth daily. 05/07/18  Yes Lady Deutscher, MD  carvedilol (COREG) 25 MG tablet Take 1 tablet (25 mg total) by mouth 2 (two) times daily with a meal. 05/06/18  Yes Lady Deutscher, MD  cephALEXin (KEFLEX) 500 MG capsule Take 1 capsule (500 mg total) by mouth 2 (two) times daily. 06/30/18  Yes Molpus, John, MD  DULoxetine (CYMBALTA) 60 MG capsule Take 60 mg by mouth at bedtime. 03/08/18  Yes [provider]  meclizine (ANTIVERT) 25 MG tablet meclizine 25 mg tablet 06/20/18  Yes [provider]  metFORMIN (GLUCOPHAGE) 500 MG tablet Take 500 mg by mouth 2 (two) times daily with a meal.    Yes [provider]  olmesartan (BENICAR) 40 MG tablet Take 40 mg by mouth daily.   Yes [provider]  ondansetron (ZOFRAN ODT) 4 MG disintegrating tablet Take 1 tablet (4 mg total) by mouth every 8 (eight) hours as needed for nausea or vomiting. 06/30/18  Yes Maudie Flakes, MD  TRESIBA FLEXTOUCH 200 UNIT/ML SOPN Inject 60 Units into the skin at bedtime.  04/26/18  Yes [provider]  aspirin 325 MG tablet Take 1 tablet (325 mg total) by mouth daily. Patient not taking: Reported on 07/02/2018 05/07/18   Lady Deutscher, MD  benzonatate (TESSALON) 100 MG capsule Take 1 capsule (100 mg total) by mouth every 8 (eight) hours. Patient not taking: Reported on 07/02/2018 05/06/18   Lady Deutscher, MD      Allergies:    No Known Allergies   Physical Exam:   Vitals  Blood pressure (!) 172/94, pulse (!) 108, temperature 99.2 F (37.3 C), temperature source Oral, resp. rate 20, height 5\' 5"  (1.651 m), weight (!) 142.2 kg, last menstrual period 06/14/2018, SpO2 99 %.   General:  Middle-aged morbidly obese female lying in bed appears fatigued, not in acute distress HEENT: Pupils reactive bilaterally, EOMI, pallor present, no icterus, moist mucosa, supple neck, JVD not appreciated Chest: Diminished bibasilar breath sounds, no rhonchi, wheeze or crackles CVS: Normal S1-S2, no murmurs rub or gallop GI: Soft, nondistended, nontender, bowel sounds present Musculoskeletal: Erythema over right mid tibia extending to the calf, minimal warmth and tender, 1+ pitting edema bilaterally CNS: Alert and oriented, nonfocal   Data Review:    CBC Recent Labs  Lab 06/29/18 2250 07/02/18 0156  WBC 12.8* 12.6*  HGB 8.3* 7.4*  HCT 26.6* 23.2*  PLT 340 306  MCV 85.0 84.4  MCH 26.5 26.9  MCHC 31.2 31.9  RDW 14.0 14.1  LYMPHSABS  --  3.2  MONOABS  --  0.8  EOSABS  --  0.5  BASOSABS  --  0.0   ------------------------------------------------------------------------------------------------------------------  Chemistries  Recent Labs  Lab 06/29/18 2250 07/02/18 0156  NA 136 136  K 4.0 3.7  CL 105 107  CO2 24 22  GLUCOSE 167* 234*  BUN 32* 31*  CREATININE 2.02* 2.00*  CALCIUM 9.4 8.1*  AST 16 18  ALT 13 13  ALKPHOS 70 63  BILITOT 0.3 0.3   ------------------------------------------------------------------------------------------------------------------ estimated creatinine clearance is 54.8 mL/min (A) (by C-G formula based on SCr of 2 mg/dL (H)). ------------------------------------------------------------------------------------------------------------------ No results for input(s): TSH, T4TOTAL, T3FREE, THYROIDAB in the last 72 hours.  Invalid input(s): FREET3  Coagulation profile No  results for input(s): INR, PROTIME in the last 168 hours. ------------------------------------------------------------------------------------------------------------------- No results for input(s): DDIMER in the last 72 hours. -------------------------------------------------------------------------------------------------------------------  Cardiac Enzymes Recent Labs  Lab 06/29/18 2250 07/02/18 0156  TROPONINI <0.03 <0.03   ------------------------------------------------------------------------------------------------------------------    Component Value Date/Time   BNP 66.9 07/02/2018 0156     ---------------------------------------------------------------------------------------------------------------  Urinalysis    Component Value Date/Time   COLORURINE YELLOW 06/30/2018 0136   APPEARANCEUR HAZY (A) 06/30/2018 0136   LABSPEC 1.015 06/30/2018 0136   PHURINE 6.0 06/30/2018 0136   GLUCOSEU 250 (A) 06/30/2018 0136   HGBUR MODERATE (A) 06/30/2018 0136   BILIRUBINUR NEGATIVE 06/30/2018 0136   KETONESUR NEGATIVE 06/30/2018 0136   PROTEINUR >300 (A) 06/30/2018 0136   UROBILINOGEN 0.2 08/21/2012 2130   NITRITE NEGATIVE 06/30/2018 0136   LEUKOCYTESUR TRACE (A) 06/30/2018 0136    ----------------------------------------------------------------------------------------------------------------   Imaging Results:    Dg Chest 2 View  Result Date: 07/02/2018 CLINICAL DATA:  Shortness of breath. Bilateral lower extremity for 1 week. EXAM: CHEST - 2 VIEW COMPARISON:  Radiographs 3 days ago 06/19/2018, multiple priors. Chest CT 02/14/2018 FINDINGS: Chronic cardiomegaly with unchanged mediastinal contours. Progressive peribronchial thickening from prior exam. Mild right perihilar atelectasis. No confluent airspace disease. No pleural effusion or pneumothorax. No acute osseous abnormalities. Multilevel degenerative change in the spine. IMPRESSION: 1. Progressive peribronchial  thickening over the past 3 days, may be acute bronchitis or pulmonary edema. Minimal right perihilar atelectasis. 2. Chronic cardiomegaly. Electronically Signed   By: Keith Rake M.D.   On: 07/02/2018 02:34    My personal review of EKG: Sinus tachycardia at 110, T wave flattening in inferior leads, no other ST-T changes.  Assessment & Plan:    Principal Problem:   Acute on chronic diastolic (congestive) heart failure (HCC) Admit to telemetry.  Likely contributed by cellulitis and possible viral URI symptoms. Placed on IV Lasix 80 mg every 12 hours.  Strict I's/O and daily weight.  Recent 2D echo 2 months back with EF of 60-65% and  no wall motion abnormality.  Does not need to be repeated. Continue aspirin, statin and beta-blocker. Add PRN nebs.  Active Problems: Hypertensive urgency Resume home dose amlodipine, Coreg.  Hold ARB due to AKI.  We will place her on BiDil 1 tablet 3 times daily.  Also on IV Lasix twice daily.  Cellulitis of right lower extremity On empiric doxycycline twice daily.  Type 2 diabetes mellitus, uncontrolled with long-term use of insulin and diabetic nephropathy On Tresiba 60 units at bedtime which will be continued.  Hold metformin.  Monitor on sliding scale coverage.  Last A1c 2 months back was >2.  Anemia Reportedly normocytic in the past but her MCV is low normal.  Check iron panel and ferritin.  Check stool for occult blood.  Denies use of NSAIDs.  Type and screen ordered.    Acute renal failure superimposed on stage 3 chronic kidney disease (HCC) Suspect cardiorenal syndrome with acute diastolic CHF.  Monitor with IV Lasix.  Avoid nephrotoxic agents.  Hyperlipidemia Resume statin.  Paroxysmal A. fib Mildly tachycardic on the monitor.  Continue aspirin and beta-blocker.  Not on anticoagulation.    Morbid obesity (HCC) BMI 52.17 kg/m.  Reportedly being evaluated for Roux-en-Y.   Depression Continue Cymbalta  Obstructive sleep apnea On  nighttime CPAP, ordered.  DVT Prophylaxis: Subcu heparin  AM Labs Ordered, also please review Full Orders  Family Communication: Admission, patients condition and plan of care including tests being ordered have been discussed with the patient at bedside  Code Status full code  Likely DC to home in the next 48-72 hours depending upon clinical improvement  Condition: [place] called: None  Admission status: Inpatient  Patient presenting with acute on chronic diastolic CHF for which she will require IV diuretics and strict I's/O monitoring as inpatient. She also has hypertensive urgency for which she requires adjustment of her blood pressure medication including IV antihypertensives for tight blood pressure control. Patient also has right lower leg cellulitis requiring antibiotics. She also has acute on chronic kidney disease along with uncontrolled diabetes mellitus which needs close inpatient monitoring. All these active multiple medical problems require her to be monitored as inpatient for >2 midnights.  Time spent in minutes : 70   Suhailah Kwan M.D on 07/02/2018 at 7:46 AM  Between 7am to 7pm - Pager - [phone]. After 7pm go to [email or web address] - password [facility]  [facility] - Office  [phone]

## 2018-07-02 NOTE — ED Provider Notes (Signed)
[place] EMERGENCY DEPARTMENT Provider Note   CSN: [id number] Arrival date & time: 07/02/18  0039     History   Chief Complaint Chief Complaint  Patient presents with  . Leg Pain    HPI Jacqueline [name] is a 37 y.o. female.  HPI  This is a 37 year old female with a history of hypertension, diabetes who presents with bilateral lower extremity swelling, pain, rash, and shortness of breath.  Patient reports that she has not been feeling well for several days.  She states that she feels very weak.  She has had increasing leg pain over the last week right greater than left.  She has noted a rash with some blistering.  She states that it is very painful and she is unable to sleep.  She also reports decreased exercise tolerance.  She states that she gets short of breath when walking several feet.  She also reports shortness of breath with laying flat.  She denies any chest pain.  Patient does report increased lower extremity swelling.  At baseline she takes Bumex 1 mg.  No recent changes in medications.  She has been evaluated for bariatric surgery and is notably anemic.  Reports that her primary doctor is working this up.  Patient denies fever cough.  Past Medical History:  Diagnosis Date  . Anemia   . Anxiety   . Chronic bronchitis ([place])   . Hypertension   . Increased frequency of headaches   . Morbid obesity ([place])   . Sleep apnea   . Type II diabetes mellitus [facility])     Patient Active Problem List   Diagnosis Date Noted  . Chest pain 05/04/2018  . Hypertensive urgency 05/03/2018  . Left facial numbness 05/03/2018  . Headache 05/03/2018  . Elevated troponin 05/03/2018  . Atrial fibrillation, permanent 05/25/2017  . Sepsis ([place]) 05/03/2017    Class: Present on Admission  . Acute renal failure superimposed on stage 3 chronic kidney disease ([place]) 05/03/2017    Class: Stage 3  . Neck pain   . Acidosis, metabolic   . Hyponatremia   . Type II diabetes mellitus with  renal manifestations ([place]) 04/30/2017  . Anemia 04/30/2017  . Staphylococcus aureus bacteremia 03/15/2016  . Streptococcal bacteremia 03/15/2016  . Tachycardia 03/14/2016  . Fever 03/14/2016  . Bad headache 03/14/2016  . Morbid obesity ([place])   . HTN (hypertension) 12/22/2015  . Diabetes ([place]) 01/01/2014    Past Surgical History:  Procedure Laterality Date  . BREAST REDUCTION SURGERY  03/21/2017  . CARDIAC CATHETERIZATION  01/06/2016  . CARDIAC CATHETERIZATION N/A 01/06/2016   Procedure: Left Heart Cath and Coronary Angiography;  Surgeon: Wellington Hampshire, MD;  Location: [facility] CV LAB;  Service: Cardiovascular;  Laterality: N/A;  . CESAREAN SECTION  08/2014     OB History    Gravida  1   Para      Term      Preterm      AB      Living        SAB      TAB      Ectopic      Multiple      Live Births               Home Medications    Prior to Admission medications   Medication Sig Start Date End Date Taking? Authorizing Provider  amLODipine (NORVASC) 10 MG tablet Take 1 tablet (10 mg total) by mouth daily. 05/07/18  Yes Lady Deutscher, MD  aspirin 325 MG tablet Take 1 tablet (325 mg total) by mouth daily. 05/07/18  Yes Lady Deutscher, MD  atorvastatin (LIPITOR) 10 MG tablet Take 10 mg by mouth every evening. 03/08/18  Yes [provider]  benzonatate (TESSALON) 100 MG capsule Take 1 capsule (100 mg total) by mouth every 8 (eight) hours. 05/06/18  Yes Lady Deutscher, MD  bumetanide (BUMEX) 1 MG tablet Take 1 tablet (1 mg total) by mouth daily. 05/07/18  Yes Lady Deutscher, MD  carvedilol (COREG) 25 MG tablet Take 1 tablet (25 mg total) by mouth 2 (two) times daily with a meal. 05/06/18  Yes Lady Deutscher, MD  cephALEXin (KEFLEX) 500 MG capsule Take 1 capsule (500 mg total) by mouth 2 (two) times daily. 06/30/18  Yes Molpus, John, MD  DULoxetine (CYMBALTA) 60 MG capsule Take 60 mg by mouth at bedtime. 03/08/18  Yes [provider]  ibuprofen (ADVIL,MOTRIN) 200 MG tablet Take 400 mg by mouth every 6 (six) hours as needed for mild pain.   Yes [provider]  metFORMIN (GLUCOPHAGE) 500 MG tablet Take 500 mg by mouth 2 (two) times daily with a meal.    Yes [provider]  olmesartan (BENICAR) 20 MG tablet Take 20 mg by mouth daily.   Yes [provider]  ondansetron (ZOFRAN ODT) 4 MG disintegrating tablet Take 1 tablet (4 mg total) by mouth every 8 (eight) hours as needed for nausea or vomiting. 06/30/18  Yes Maudie Flakes, MD  TRESIBA FLEXTOUCH 200 UNIT/ML SOPN Inject 30 Units into the skin at bedtime. 04/26/18  Yes [provider]    Family History Family History  Problem Relation Age of Onset  . Diabetes Mother   . Hypertension Mother   . Thyroid disease Mother   . Kidney disease Maternal Grandmother   . Diabetes Maternal Grandmother   . Heart attack Other     Social History Social History   Tobacco Use  . Smoking status: Never Smoker  . Smokeless tobacco: Never Used  Substance Use Topics  . Alcohol use: No  . Drug use: No     Allergies   Patient has no known allergies.   Review of Systems Review of Systems  Constitutional: Negative for fever.  Respiratory: Positive for shortness of breath. Negative for cough.   Cardiovascular: Positive for leg swelling. Negative for chest pain.  Gastrointestinal: Negative for abdominal pain, nausea and vomiting.  Genitourinary: Negative for dysuria.  Musculoskeletal: Negative for back pain.  Skin: Positive for rash.  Neurological: Negative for weakness.  All other systems reviewed and are negative.    Physical Exam Updated Vital Signs BP (!) 183/84 (BP Location: Right Arm)   Pulse (!) 109   Temp 98.9 F (37.2 C) (Oral)   Resp 18   Ht 1.651 m (5\' 5" )   Wt 135.2 kg   LMP 06/14/2018 (Approximate)   SpO2 98%   BMI 49.59 kg/m   Physical Exam Vitals signs and nursing note reviewed.    Constitutional:      Appearance: She is well-developed.     Comments: Morbidly obese  HENT:     Head: Normocephalic and atraumatic.  Eyes:     Pupils: Pupils are equal, round, and reactive to light.  Neck:     Musculoskeletal: Neck supple.  Cardiovascular:     Rate and Rhythm: Regular rhythm. Tachycardia present.     Heart sounds: Normal heart sounds.  Pulmonary:     Effort: Pulmonary effort is normal. No respiratory distress.     Breath sounds: No wheezing.  Abdominal:     General: Bowel sounds are normal.     Palpations: Abdomen is soft.     Tenderness: There is no abdominal tenderness. There is no rebound.  Musculoskeletal:     Right lower leg: Edema present.     Left lower leg: Edema present.     Comments: 3+ bilateral lower extremity edema pitting to the knees  Skin:    General: Skin is warm and dry.     Comments: Erythema and warmth to the right lower extremity  Neurological:     Mental Status: She is alert and oriented to person, place, and time.      ED Treatments / Results  Labs (all labs ordered are listed, but only abnormal results are displayed) Labs Reviewed  CBC WITH DIFFERENTIAL/PLATELET - Abnormal; Notable for the following components:      Result Value   WBC 12.6 (*)    RBC 2.75 (*)    Hemoglobin 7.4 (*)    HCT 23.2 (*)    Neutro Abs 8.1 (*)    All other components within normal limits  COMPREHENSIVE METABOLIC PANEL - Abnormal; Notable for the following components:   Glucose, Bld 234 (*)    BUN 31 (*)    Creatinine, Ser 2.00 (*)    Calcium 8.1 (*)    Total Protein 6.3 (*)    Albumin 2.5 (*)    GFR calc non Af Amer 31 (*)    GFR calc Af Amer 36 (*)    All other components within normal limits  BRAIN NATRIURETIC PEPTIDE  PREGNANCY, URINE  TROPONIN I    EKG EKG Interpretation  Date/Time:  Monday July 02 2018 02:05:04 EST Ventricular Rate:  110 PR Interval:    QRS Duration: 86 QT Interval:  338 QTC Calculation: 458 R  Axis:   105 Text Interpretation:  Sinus tachycardia Borderline right axis deviation Confirmed by Thayer Jew [phone]) on 07/02/2018 2:36:59 AM   Radiology Dg Chest 2 View  Result Date: 07/02/2018 CLINICAL DATA:  Shortness of breath. Bilateral lower extremity for 1 week. EXAM: CHEST - 2 VIEW COMPARISON:  Radiographs 3 days ago 06/19/2018, multiple priors. Chest CT 02/14/2018 FINDINGS: Chronic cardiomegaly with unchanged mediastinal contours. Progressive peribronchial thickening from prior exam. Mild right perihilar atelectasis. No confluent airspace disease. No pleural effusion or pneumothorax. No acute osseous abnormalities. Multilevel degenerative change in the spine. IMPRESSION: 1. Progressive peribronchial thickening over the past 3 days, may be acute bronchitis or pulmonary edema. Minimal right perihilar atelectasis. 2. Chronic cardiomegaly. Electronically Signed   By: Keith Rake M.D.   On: 07/02/2018 02:34    Procedures Procedures (including critical care time)  CRITICAL CARE Performed by: Merryl Hacker   Total critical care time: 45 minutes  Critical care time was exclusive of separately billable procedures and treating other patients.  Critical care was necessary to treat or prevent imminent or life-threatening deterioration.  Critical care was time spent personally by me on the following activities: development of treatment plan with patient and/or surrogate as well as nursing, discussions with consultants, evaluation of patient's response to treatment, examination of patient, obtaining history from patient or surrogate, ordering and performing treatments and interventions, ordering and review of laboratory studies, ordering and review of radiographic studies, pulse oximetry and re-evaluation of patient's condition.   Medications Ordered in ED Medications  furosemide (LASIX)  injection 80 mg (has no administration in time range)  doxycycline (VIBRA-TABS) tablet 100  mg (has no administration in time range)  amLODipine (NORVASC) tablet 10 mg (has no administration in time range)  nitroGLYCERIN (NITROGLYN) 2 % ointment 1 inch (has no administration in time range)  oxyCODONE-acetaminophen (PERCOCET/ROXICET) 5-325 MG per tablet 1 tablet (1 tablet Oral Given 07/02/18 0151)     Initial Impression / Assessment and Plan / ED Course  I have reviewed the triage vital signs and the nursing notes.  Pertinent labs & imaging results that were available during my care of the patient were reviewed by me and considered in my medical decision making (see chart for details).     Patient presents with several complaints.  Was seen and evaluated 3 days ago with multiple complaints as well.  At that time she was found to have anemia and some mild AKI.  Today she appears to have cellulitis of the right lower extremity with lower extremity edema.  She describes symptoms concerning for pulmonary edema as well.  She denies any chest pain.  She is notably hypertensive with blood pressure of 215/98.  She reports taking her medications today.  Work-up initiated.  Slight leukocytosis to 12.6.  She is anemic with a hemoglobin of 7.4.  She denies any heavy periods or rectal bleeding.  Recent baselines have been in the mid eights.  Chest x-raynow shows concern for acute bronchitis versus pulmonary edema.  With suspect pulmonary edema.  This may be in the setting of volume overload but also part related to hypertensive urgency.  She was dosed her amlodipine.  She was also given Nitropaste.  She does have some evidence of right lower extremity cellulitis.  Given her volume overload, would like to avoid significant fluid challenge.  Patient was given p.o. doxycycline.  Additionally she was given 80 mg of IV Lasix.  Given multiple issues including hypertensive urgency, lower extremity cellulitis, some evidence of pulmonary edema, will admit for diuresis, antibiotics and blood pressure  control.  Final Clinical Impressions(s) / ED Diagnoses   Final diagnoses:  Cellulitis of right lower extremity  Lower extremity edema  Acute pulmonary edema (HCC)  Anemia, unspecified type  Hypertensive urgency    ED Discharge Orders    None       Merryl Hacker, MD 07/02/18 [phone]

## 2018-07-02 NOTE — ED Notes (Signed)
Patient transported to X-ray 

## 2018-07-02 NOTE — ED Triage Notes (Signed)
Pt reports leg pain for a week. Now has "rash" and blisters on left leg. States it is very painful and keeping her from sleeping

## 2018-07-02 NOTE — ED Notes (Signed)
Pt called out requesting something for pain. RN spoke with patient and told her the provider will need to evaluate her first. NAD noted

## 2018-07-02 NOTE — Progress Notes (Signed)
Dr. Blaine Hamper will pass admission on to oncoming MD for admission. MEWS score prompted note. Pt started on Doxycycline treatment. Hoyle Barr, RN

## 2018-07-02 NOTE — Care Management (Signed)
This is a no charge note  Transfer from [facility] per Dr. Dina Rich  37 year old lady with past medical history of hypertension, diabetes mellitus, anxiety, anemia, OSA, morbid obesity, CKD-3, atrial fibrillation not on anticoagulants, chronic anemia, dCHF, who presents with multiple complaints, including right lower extremity pain which is likely due to cellulitis;  bilateral leg edema and shortness of breath which are likely due to acute on chronic dCHF exacerbation; hypertensive urgency; and worsening anemia, hemoglobin dropped from 9.1 on 05/03/2018--> 8.3 on 06/29/2018--> 7.4 today.  Denies rectal or vaginal bleeding.  Per ED physician, patient is being worked up for Roux-en-Y surgery currently.  She had negative work-up for occult GI bleeding by PCP recently.  Initial blood pressure is elevated at 215/98, which improved to 183/84 after treated with nitroglycerin patch, IV Lasix 80 mg and 10 mg of oral amlodipine.  Patient was found to have WBC 12.6, negative troponin, BNP 66.9, negative pregnancy test, worsening renal function with creatinine up from baseline 1.3-1.5 to 2.06, BUN 31, temperature 99.2, tachycardia, tachypnea, oxygen saturation 98%.  Chest x-ray showed peri-bronchial thickening, which is likely due to pulmonary edema.  Patient was given 1 dose of Lasix 80 mg IV, nitroglycerin patch, doxycycline in ED. Pt is placed on tele bed for obs.  Please call manager of [facility] at [phone] when pt arrives to floor   Ivor Costa, MD  [facility] Pager [phone]  If 7PM-7AM, please contact night-coverage [email or web address] Password [email or web address] 07/02/2018, 4:20 AM

## 2018-07-02 NOTE — ED Notes (Signed)
ED Provider at bedside. 

## 2018-07-03 ENCOUNTER — Inpatient Hospital Stay (HOSPITAL_COMMUNITY): Payer: BLUE CROSS/BLUE SHIELD

## 2018-07-03 DIAGNOSIS — D638 Anemia in other chronic diseases classified elsewhere: Secondary | ICD-10-CM

## 2018-07-03 DIAGNOSIS — N183 Chronic kidney disease, stage 3 unspecified: Secondary | ICD-10-CM

## 2018-07-03 DIAGNOSIS — Z794 Long term (current) use of insulin: Secondary | ICD-10-CM

## 2018-07-03 DIAGNOSIS — I160 Hypertensive urgency: Secondary | ICD-10-CM

## 2018-07-03 DIAGNOSIS — N179 Acute kidney failure, unspecified: Secondary | ICD-10-CM

## 2018-07-03 DIAGNOSIS — R609 Edema, unspecified: Secondary | ICD-10-CM

## 2018-07-03 DIAGNOSIS — E66813 Obesity, class 3: Secondary | ICD-10-CM

## 2018-07-03 DIAGNOSIS — N185 Chronic kidney disease, stage 5: Secondary | ICD-10-CM

## 2018-07-03 DIAGNOSIS — IMO0001 Reserved for inherently not codable concepts without codable children: Secondary | ICD-10-CM

## 2018-07-03 DIAGNOSIS — E119 Type 2 diabetes mellitus without complications: Secondary | ICD-10-CM

## 2018-07-03 LAB — BASIC METABOLIC PANEL
Anion gap: 6 (ref 5–15)
BUN: 34 mg/dL — ABNORMAL HIGH (ref 6–20)
CO2: 25 mmol/L (ref 22–32)
Calcium: 8 mg/dL — ABNORMAL LOW (ref 8.9–10.3)
Chloride: 108 mmol/L (ref 98–111)
Creatinine, Ser: 2.38 mg/dL — ABNORMAL HIGH (ref 0.44–1.00)
GFR calc Af Amer: 29 mL/min — ABNORMAL LOW (ref >60)
GFR, EST NON AFRICAN AMERICAN: 25 mL/min — AB (ref >60)
Glucose, Bld: 202 mg/dL — ABNORMAL HIGH (ref 70–99)
POTASSIUM: 4.2 mmol/L (ref 3.5–5.1)
Sodium: 139 mmol/L (ref 135–145)

## 2018-07-03 LAB — CBC
HCT: 22.1 % — ABNORMAL LOW (ref 36.0–46.0)
Hemoglobin: 6.7 g/dL — CL (ref 12.0–15.0)
MCH: 26.4 pg (ref 26.0–34.0)
MCHC: 30.3 g/dL (ref 30.0–36.0)
MCV: 87 fL (ref 80.0–100.0)
Platelets: 295 10*3/uL (ref 150–400)
RBC: 2.54 MIL/uL — ABNORMAL LOW (ref 3.87–5.11)
RDW: 14.1 % (ref 11.5–15.5)
WBC: 9.4 10*3/uL (ref 4.0–10.5)
nRBC: 0 % (ref 0.0–0.2)

## 2018-07-03 LAB — PREPARE RBC (CROSSMATCH)

## 2018-07-03 LAB — GLUCOSE, CAPILLARY
GLUCOSE-CAPILLARY: 134 mg/dL — AB (ref 70–99)
Glucose-Capillary: 168 mg/dL — ABNORMAL HIGH (ref 70–99)
Glucose-Capillary: 234 mg/dL — ABNORMAL HIGH (ref 70–99)
Glucose-Capillary: 149 mg/dL — ABNORMAL HIGH (ref 70–99)

## 2018-07-03 LAB — HEMOGLOBIN AND HEMATOCRIT, BLOOD
HCT: 30.7 % — ABNORMAL LOW (ref 36.0–46.0)
Hemoglobin: 9.2 g/dL — ABNORMAL LOW (ref 12.0–15.0)

## 2018-07-03 MED ORDER — POLYETHYLENE GLYCOL 3350 17 G PO PACK
17.0000 g | PACK | Freq: Every day | ORAL | Status: DC
  Administered 2018-07-03 – 2018-07-05 (×3): 17 g via ORAL
  Filled 2018-07-03 (×4): qty 1

## 2018-07-03 MED ORDER — SODIUM CHLORIDE 0.9% IV SOLUTION: Freq: Once | INTRAVENOUS | Status: DC

## 2018-07-03 MED ORDER — ALBUTEROL SULFATE (2.5 MG/3ML) 0.083% IN NEBU: 2.5000 mg | INHALATION_SOLUTION | Freq: Four times a day (QID) | RESPIRATORY_TRACT | Status: DC | PRN

## 2018-07-03 MED ORDER — SENNOSIDES-DOCUSATE SODIUM 8.6-50 MG PO TABS
1.0000 | ORAL_TABLET | Freq: Two times a day (BID) | ORAL | Status: DC
  Administered 2018-07-03 – 2018-07-05 (×6): 1 via ORAL
  Filled 2018-07-03 (×7): qty 1

## 2018-07-03 NOTE — Progress Notes (Signed)
CRITICAL VALUE ALERT  Critical Value:  Hgb 6.7  Date & Time Notied: 07/03/2018  0556 Provider Notified: K shorr  Orders Received/Actions taken: One Unit of PRBC ordered.   Will continue to monitor

## 2018-07-03 NOTE — Progress Notes (Signed)
*  Preliminary Results* Bilateral lower extremity venous duplex completed. Bilateral lower extremities are negative for deep vein thrombosis. There is no evidence of Baker's cyst bilaterally.  07/03/2018 2:39 PM Jacqueline [name] Dawna Part

## 2018-07-03 NOTE — Progress Notes (Addendum)
PROGRESS NOTE  Joleen Stuckert OAC:[id number] DOB: 02/16/1981 DOA: 07/02/2018 PCP: Audley Hose, MD  Brief History:   Jacqueline [name]  is a 37 y.o. female, with history of hypertension, uncontrolled diabetes mellitus on insulin (last A1c >10), normocytic anemia, obstructive sleep apnea, morbid obesity, chronic kidney disease stage III (baseline creatinine around 1.4-1.5), A. fib not on anticoagulants, diastolic CHF who presented to the [facility] ED with 3 days history of right lower leg erythema and pain with bilateral leg swellings.  Patient also reports increasing shortness of breath for past 1 week.  Denies any sick contacts (except for her 18-year-old daughter who had URI symptoms recently), denies headache, dizziness, blurred vision, nausea, vomiting, chest pain, palpitations, orthopnea or PND.  Denies any abdominal pain, dysuria, diarrhea, melena or bright red blood per rectum.  Denies use of NSAIDs.  She was seen in the ED 3 days back for leg swellings, dyspnea and dizziness, given 1 L IV normal saline bolus and was also prescribed Keflex (I think this was for suspected UTI based on UA).  Course in the ED Patient had low-grade fever of 99.2 F, tachycardic in low 100s, tachypneic, hypertensive with blood pressure of 2 1 5/98 mmHg and maintaining sats on room air. Blood work showed WBC of 12 point 6K, hemoglobin of 7.4 (dropped almost 2 g from her baseline), platelets 306, chemistry showed normal sodium and potassium, BUN of 31 and creatinine of 2 (elevated from baseline around 1.5), glucose of 234.  BNP was normal. Chest x-ray done showed progressive peribronchial thickening seen compared to 3 days back with findings of acute bronchitis versus pulmonary edema. Patient given amlodipine, nitroglycerin patch, IV Lasix 80 mg x 1, 1 dose of doxycycline and oxycodone.  Hospitalist consulted for admission to telemetry for acute on chronic diastolic CHF, hypertensive urgency and right  lower leg cellulitis.   HPI/Recap of past 24 hours:  hgb dropped ,denies active bleeding  Denies chest pain, on room air at rest, no fever  Remain edematous, right lower extremity cellulitis improving  Good urine output  Assessment/Plan: Principal Problem:   Acute on chronic diastolic (congestive) heart failure (HCC) Active Problems:   Morbid obesity ([place])   Type II diabetes mellitus with renal manifestations (HCC)   Normocytic anemia   Acute renal failure superimposed on stage 3 chronic kidney disease (HCC)   PAF (paroxysmal atrial fibrillation) (HCC)   Hypertensive urgency   HLD (hyperlipidemia)   Depression   OSA (obstructive sleep apnea)   Cellulitis of right lower extremity   Anemia   Type 2 diabetes mellitus with hyperglycemia, with long-term current use of insulin (HCC)  Acute on chronic diastolic chf/significant volume overload/bilateral lower extremity edema -she reports is started on bumex a few months ago by pcp -Recent 2D echo 2 months back with EF of 60-65% and no wall motion abnormality.  Does not need to be repeated. -she had unremarkable cardiac cath in 2017 -troponin negative, bnp in obese is not reliable -venous doppler no DVT -significant volume overloaded, continue current dose of iv lasix as long as bp and cr allows  Hypertensive urgency Elevated blood pressure likely due to volume overload, and stress sbp 215 on presentation, She is started on BiDil 1 tablet 3 times daily.  home dose amlodipine, Coreg continued on admission.  ARB  Held on admission due to AKI.   sbp today in 120's , hold bidil/ norvas/ARB  continue coreg and iv lasix, need to keep bp on high  normal to facilitate diuresis.   Cellulitis of right lower extremity Mild  On empiric doxycycline twice daily. Appear improving  PAF? She reports see cardiology Dr Percival Spanish , but there is not documentation of paf per Dr Rosezella [place] note. I did not see a history of paf except in one prior  discharge summary. She is on coreg and asa 325 , she reports not taking due to  planned weight loss surgery, per careevery where note from 12/10, there is no definitely plan for surgery yet) She is sinus rhythm here  Type 2 diabetes mellitus, uncontrolled with long-term use of insulin and diabetic nephropathy On Tresiba 60 units at bedtime which will be continued.  Hold metformin.  Monitor on sliding scale coverage.  Last A1c 2 in 04/2018 was 10.2  CKDIII Possible AKI on CKDIII Due to cardiorenal/diruesis Repeat lab in am ,renal dosing meds  Anemia of chronic disease Acute drop of hgb to 6.7, no sign of bleeding ( FOBT pending) Volume overload contribute to drop of hgb as well S/p prbc x1 Monitor hgb    Morbid obesity/OSA on CPAP Body mass index is 51.04 kg/m. Reports holding asa due to planned weight loss surgery   Code Status: full  Family Communication: patient   Disposition Plan: significant volume overloaded, likely will need to be in the hospital a few days to maximize diuresis   Consultants:  none  Procedures:  none  Antibiotics:  doxycycline   Objective: BP 124/67   Pulse 89   Temp 98 F (36.7 C) (Oral)   Resp 14   Ht 5\' 5"  (1.651 m)   Wt (!) 139.1 kg   LMP 06/14/2018 (Approximate)   SpO2 98%   BMI 51.04 kg/m   Intake/Output Summary (Last 24 hours) at 07/03/2018 1315 Last data filed at 07/03/2018 1238 Gross per 24 hour  Intake 600 ml  Output 2700 ml  Net -2100 ml   Filed Weights   07/02/18 0046 07/02/18 0618 07/03/18 0513  Weight: 135.2 kg (!) 142.2 kg (!) 139.1 kg    Exam: Patient is examined daily including today on 07/03/2018, exams remain the same as of yesterday except that has changed    General:  NAD  Cardiovascular: RRR  Respiratory: CTABL  Abdomen: Soft/ND/NT, positive BS  Musculoskeletal: bilateral lower extremity pitting Edema, superficial cellulitis left anterior shin is improving  Neuro: alert, oriented   Data  Reviewed: Basic Metabolic Panel: Recent Labs  Lab 06/29/18 2250 07/02/18 0156 07/02/18 0816 07/03/18 0535  NA 136 136  --  139  K 4.0 3.7  --  4.2  CL 105 107  --  108  CO2 24 22  --  25  GLUCOSE 167* 234*  --  202*  BUN 32* 31*  --  34*  CREATININE 2.02* 2.00* 2.05* 2.38*  CALCIUM 9.4 8.1*  --  8.0*   Liver Function Tests: Recent Labs  Lab 06/29/18 2250 07/02/18 0156  AST 16 18  ALT 13 13  ALKPHOS 70 63  BILITOT 0.3 0.3  PROT 7.6 6.3*  ALBUMIN 2.8* 2.5*   Recent Labs  Lab 06/29/18 2250  LIPASE 35   No results for input(s): AMMONIA in the last 168 hours. CBC: Recent Labs  Lab 06/29/18 2250 07/02/18 0156 07/02/18 0816 07/03/18 0535  WBC 12.8* 12.6* 11.4* 9.4  NEUTROABS  --  8.1*  --   --   HGB 8.3* 7.4* 7.1* 6.7*  HCT 26.6* 23.2* 23.3* 22.1*  MCV 85.0 84.4 85.7 87.0  PLT  340 306 342 295   Cardiac Enzymes:   Recent Labs  Lab 06/29/18 2250 07/02/18 0156  TROPONINI <0.03 <0.03   BNP (last 3 results) Recent Labs    05/03/18 1358 06/29/18 2250 07/02/18 0156  BNP 61.4 52.1 66.9    ProBNP (last 3 results) No results for input(s): PROBNP in the last 8760 hours.  CBG: Recent Labs  Lab 07/02/18 1214 07/02/18 1730 07/02/18 2102 07/03/18 0741 07/03/18 1139  GLUCAP 146* 133* 192* 168* 234*    Recent Results (from the past 240 hour(s))  Urine culture     Status: Abnormal   Collection Time: 06/30/18  1:36 AM  Result Value Ref Range Status   Specimen Description   Final    URINE, RANDOM Performed at [facility], [address] [place], [place] [address]    Special Requests   Final    NONE Performed at [facility], [address] [place], [place] [address]    Culture (A)  Final    <10,000 COLONIES/mL INSIGNIFICANT GROWTH Performed at [facility], [place] [address] [place], [place] [address]    Report Status 07/01/2018 FINAL  Final  Culture, blood (Routine X 2) w Reflex to ID Panel     Status: None  (Preliminary result)   Collection Time: 07/02/18  8:16 AM  Result Value Ref Range Status   Specimen Description   Final    BLOOD RIGHT ARM Performed at [facility] [address], [place], [place] [address]    Special Requests   Final    BOTTLES DRAWN AEROBIC ONLY Blood Culture adequate volume Performed at [facility] [address], [place], [place] [address]    Culture   Final    NO GROWTH 1 DAY Performed at [facility], [place] [address], [place], [place] [address]    Report Status PENDING  Incomplete  Culture, blood (Routine X 2) w Reflex to ID Panel     Status: None (Preliminary result)   Collection Time: 07/02/18  8:16 AM  Result Value Ref Range Status   Specimen Description   Final    BLOOD LEFT ARM Performed at [facility] [address] [place], [place] [address]    Special Requests   Final    BOTTLES DRAWN AEROBIC ONLY Blood Culture adequate volume Performed at [facility] [address] [place],  [address]    Culture   Final    NO GROWTH 1 DAY Performed at [facility], [place] [address], [place],  [address]    Report Status PENDING  Incomplete     Studies: No results found.  Scheduled Meds: . sodium chloride   Intravenous Once  . amLODipine  10 mg Oral Daily  . aspirin  325 mg Oral Daily  . atorvastatin  10 mg Oral QPM  . carvedilol  25 mg Oral BID WC  . doxycycline  100 mg Oral Q12H  . DULoxetine  60 mg Oral QHS  . enoxaparin (LOVENOX) injection  40 mg Subcutaneous Q24H  . furosemide  80 mg Intravenous Q12H  . insulin aspart  0-15 Units Subcutaneous TID WC  . insulin aspart  0-5 Units Subcutaneous QHS  . insulin glargine  60 Units Subcutaneous QHS  . isosorbide-hydrALAZINE  1 tablet Oral TID  . polyethylene glycol  17 g Oral Daily  . senna-docusate  1 tablet Oral BID    Continuous  Infusions:   Time spent: 65mins I have personally reviewed  and interpreted on  07/03/2018 daily labs, tele strips, imagings as discussed above under date review session and assessment and plans.  I reviewed all nursing notes, pharmacy notes,  vitals, pertinent old records  I have discussed plan of care as described above with RN , patient  on 07/03/2018   Florencia Reasons MD, PhD  [facility] Pager [phone]. If 7PM-7AM, please contact night-coverage at [email or web address], password [facility] 07/03/2018, 1:15 PM  LOS: 1 day

## 2018-07-04 LAB — BASIC METABOLIC PANEL
Anion gap: 10 (ref 5–15)
BUN: 35 mg/dL — ABNORMAL HIGH (ref 6–20)
CALCIUM: 8.1 mg/dL — AB (ref 8.9–10.3)
CO2: 25 mmol/L (ref 22–32)
CREATININE: 2.16 mg/dL — AB (ref 0.44–1.00)
Chloride: 106 mmol/L (ref 98–111)
GFR calc Af Amer: 33 mL/min — ABNORMAL LOW (ref >60)
GFR calc non Af Amer: 28 mL/min — ABNORMAL LOW (ref >60)
Glucose, Bld: 76 mg/dL (ref 70–99)
Potassium: 3.9 mmol/L (ref 3.5–5.1)
Sodium: 141 mmol/L (ref 135–145)

## 2018-07-04 LAB — TYPE AND SCREEN
ABO/RH(D): B POS
Antibody Screen: NEGATIVE
Unit division: 0

## 2018-07-04 LAB — CBC WITH DIFFERENTIAL/PLATELET
Abs Immature Granulocytes: 0.03 10*3/uL (ref 0.00–0.07)
Basophils Absolute: 0.1 10*3/uL (ref 0.0–0.1)
Basophils Relative: 1 %
Eosinophils Absolute: 0.5 10*3/uL (ref 0.0–0.5)
Eosinophils Relative: 5 %
HCT: 26.8 % — ABNORMAL LOW (ref 36.0–46.0)
Hemoglobin: 8.3 g/dL — ABNORMAL LOW (ref 12.0–15.0)
Immature Granulocytes: 0 %
Lymphocytes Relative: 34 %
Lymphs Abs: 3.4 10*3/uL (ref 0.7–4.0)
MCH: 25.9 pg — ABNORMAL LOW (ref 26.0–34.0)
MCHC: 31 g/dL (ref 30.0–36.0)
MCV: 83.8 fL (ref 80.0–100.0)
Monocytes Absolute: 0.9 10*3/uL (ref 0.1–1.0)
Monocytes Relative: 9 %
Neutro Abs: 5.2 10*3/uL (ref 1.7–7.7)
Neutrophils Relative %: 51 %
PLATELETS: 377 10*3/uL (ref 150–400)
RBC: 3.2 MIL/uL — ABNORMAL LOW (ref 3.87–5.11)
RDW: 14.5 % (ref 11.5–15.5)
WBC: 10 10*3/uL (ref 4.0–10.5)
nRBC: 0 % (ref 0.0–0.2)

## 2018-07-04 LAB — GLUCOSE, CAPILLARY
Glucose-Capillary: 64 mg/dL — ABNORMAL LOW (ref 70–99)
Glucose-Capillary: 80 mg/dL (ref 70–99)
Glucose-Capillary: 72 mg/dL (ref 70–99)
Glucose-Capillary: 102 mg/dL — ABNORMAL HIGH (ref 70–99)
Glucose-Capillary: 74 mg/dL (ref 70–99)
Glucose-Capillary: 65 mg/dL — ABNORMAL LOW (ref 70–99)

## 2018-07-04 LAB — BPAM RBC
Blood Product Expiration Date: 202001152359
ISSUE DATE / TIME: 201912171105
UNIT TYPE AND RH: 7300

## 2018-07-04 MED ORDER — SODIUM CHLORIDE 0.9 % IV SOLN
510.0000 mg | Freq: Once | INTRAVENOUS | Status: AC
  Administered 2018-07-04: 510 mg via INTRAVENOUS
  Filled 2018-07-04: qty 17

## 2018-07-04 MED ORDER — SODIUM CHLORIDE 0.9 % IV SOLN
INTRAVENOUS | Status: DC | PRN
  Administered 2018-07-04: 250 mL via INTRAVENOUS

## 2018-07-04 NOTE — Progress Notes (Addendum)
PROGRESS NOTE    Jacqueline [name]  TMH:[id number] DOB: Sep 09, 1980 DOA: 07/02/2018 PCP: Audley Hose, MD    Brief Narrative: 37 year old with past medical history significant for hypertension, diabetes on insulin [name] hemoglobin A1c more than 10, obstructive sleep apnea, morbid obesity, chronic kidney disease a stage III creatinine baseline 9.2---1.1, A. fib, diastolic heart failure who presents with 3 days of right lower extremity edema and pain. Patient was also found to have increased shortness of breath.  Chest x-ray with acute bronchitis versus pulmonary edema.   Assessment & Plan:   Principal Problem:   Acute on chronic diastolic (congestive) heart failure (HCC) Active Problems:   Morbid obesity ([place])   Type II diabetes mellitus with renal manifestations (HCC)   Normocytic anemia   Acute renal failure superimposed on stage 3 chronic kidney disease (HCC)   PAF (paroxysmal atrial fibrillation) (HCC)   Hypertensive urgency   HLD (hyperlipidemia)   Depression   OSA (obstructive sleep apnea)   Cellulitis of right lower extremity   Anemia   Type 2 diabetes mellitus with hyperglycemia, with long-term current use of insulin (HCC)   AKI (acute kidney injury) ([place])   CKD (chronic kidney disease), stage III (HCC)   Obesity, Class III, BMI 40-49.9 (morbid obesity) (HCC)   Insulin dependent diabetes mellitus (HCC)   Anemia of chronic disease   1-acute on chronic diastolic heart failure exacerbation: Patient presented with shortness of breath, volume overload. She was a started on Bumex a few months ago by PCP. Dopplers negative for DVT. Continue with IV Lasix, continue to monitor creatinine. Weight today at 305.  Dry weight probably 295. Negative 5 L.   2-acute on chronic kidney disease stage III Per records creatinine baseline 1.7. Due to cardiorenal syndrome. Continue with IV Lasix, monitor renal function daily. Creatinine is stable so far.  Cellulitis of right lower  extremity. Continue with doxycycline. Redness decreasing.  Paroxysmal A. Fib Continue with Coreg, and aspirin. Sinus rhythm during this admission.  Diabetes type 2 hold metformin. Hold lantus tonight, cbg in the 70 ranges. Will resume tomorrow morning depending on CBG.   Anemia of chronic disease, iron deficiency anemia.  Iron level at 22. Hemoglobin dropped to 6.7, no signs of bleeding. Safe 1 unit of packed red blood cell. Will give 1 dose of IV iron.  Morbid obesity/OSA on CPAP Body mass index is 51.04 kg/m. Reports holding asa due to planned weight loss surgery He was supposed to have surgery today.  RN Pressure Injury Documentation:    Malnutrition Type:      Malnutrition Characteristics:      Nutrition Interventions:     Estimated body mass index is 50.77 kg/m as calculated from the following:   Height as of this encounter: 5\' 5"  (1.651 m).   Weight as of this encounter: 138.4 kg.   DVT prophylaxis: Lovenox Code Status: full code.  Family Communication: care discussed with daughter  Disposition Plan: remain inpatient for IV lasix.   Consultants:   none   Procedures:   none   Antimicrobials;   Doxycycline   Subjective: She is feeling drowsy today.  She is still SOB.   Objective: Vitals:   07/03/18 1716 07/03/18 2101 07/03/18 2143 07/04/18 0439  BP:   122/68 134/81  Pulse:  88 89 93  Resp:  15 18 16   Temp: 98.3 F (36.8 C)  98.7 F (37.1 C) 98.1 F (36.7 C)  TempSrc: Oral  Oral Oral  SpO2:  94% 99% 98%  Weight:    (!) 138.4 kg  Height:        Intake/Output Summary ([name] 24 hours) at 07/04/2018 1501 [name] data filed at 07/04/2018 1033 Gross per 24 hour  Intake 350 ml  Output 1400 ml  Net -1050 ml   Filed Weights   07/02/18 0618 07/03/18 0513 07/04/18 0439  Weight: (!) 142.2 kg (!) 139.1 kg (!) 138.4 kg    Examination:  General exam: Appears calm and comfortable  Respiratory system: Bilateral crackles.    Cardiovascular system: S1 & S2 heard, RRR.  JVD, murmurs, rubs, gallops or clicks. Plus 2 edema Gastrointestinal system: Abdomen is nondistended, soft and nontender. No organomegaly or masses felt. Normal bowel sounds heard. Central nervous system: Alert and oriented. No focal neurological deficits. Extremities: Symmetric 5 x 5 power. Skin: redness of the right extremity decreasing Psychiatry: Judgement and insight appear normal. Mood & affect appropriate.     Data Reviewed: I have personally reviewed following labs and imaging studies  CBC: Recent Labs  Lab 06/29/18 2250 07/02/18 0156 07/02/18 0816 07/03/18 0535 07/03/18 1821 07/04/18 0507  WBC 12.8* 12.6* 11.4* 9.4  --  10.0  NEUTROABS  --  8.1*  --   --   --  5.2  HGB 8.3* 7.4* 7.1* 6.7* 9.2* 8.3*  HCT 26.6* 23.2* 23.3* 22.1* 30.7* 26.8*  MCV 85.0 84.4 85.7 87.0  --  83.8  PLT 340 306 342 295  --  785   Basic Metabolic Panel: Recent Labs  Lab 06/29/18 2250 07/02/18 0156 07/02/18 0816 07/03/18 0535 07/04/18 0507  NA 136 136  --  139 141  K 4.0 3.7  --  4.2 3.9  CL 105 107  --  108 106  CO2 24 22  --  25 25  GLUCOSE 167* 234*  --  202* 76  BUN 32* 31*  --  34* 35*  CREATININE 2.02* 2.00* 2.05* 2.38* 2.16*  CALCIUM 9.4 8.1*  --  8.0* 8.1*   GFR: Estimated Creatinine Clearance: 50 mL/min (A) (by C-G formula based on SCr of 2.16 mg/dL (H)). Liver Function Tests: Recent Labs  Lab 06/29/18 2250 07/02/18 0156  AST 16 18  ALT 13 13  ALKPHOS 70 63  BILITOT 0.3 0.3  PROT 7.6 6.3*  ALBUMIN 2.8* 2.5*   Recent Labs  Lab 06/29/18 2250  LIPASE 35   No results for input(s): AMMONIA in the [name] 168 hours. Coagulation Profile: No results for input(s): INR, PROTIME in the [name] 168 hours. Cardiac Enzymes: Recent Labs  Lab 06/29/18 2250 07/02/18 0156  TROPONINI <0.03 <0.03   BNP ([name] 3 results) No results for input(s): PROBNP in the [name] 8760 hours. HbA1C: No results for input(s): HGBA1C in the [name] 72  hours. CBG: Recent Labs  Lab 07/03/18 1616 07/03/18 2140 07/04/18 0459 07/04/18 0515 07/04/18 1112  GLUCAP 149* 134* 64* 80 102*   Lipid Profile: No results for input(s): CHOL, HDL, LDLCALC, TRIG, CHOLHDL, LDLDIRECT in the [name] 72 hours. Thyroid Function Tests: No results for input(s): TSH, T4TOTAL, FREET4, T3FREE, THYROIDAB in the [name] 72 hours. Anemia Panel: Recent Labs    07/02/18 0816  FERRITIN 55  TIBC 196*  IRON 22*   Sepsis Labs: No results for input(s): PROCALCITON, LATICACIDVEN in the [name] 168 hours.  Recent Results (from the past 240 hour(s))  Urine culture     Status: Abnormal   Collection Time: 06/30/18  1:36 AM  Result Value Ref Range Status   Specimen Description   Final  URINE, RANDOM Performed at [facility], [address] [place], [place] [address]    Special Requests   Final    NONE Performed at [facility], [address] [place], [place] [address]    Culture (A)  Final    <10,000 COLONIES/mL INSIGNIFICANT GROWTH Performed at [facility] [address], [place], [place] [address]    Report Status 07/01/2018 FINAL  Final  Culture, blood (Routine X 2) w Reflex to ID Panel     Status: None (Preliminary result)   Collection Time: 07/02/18  8:16 AM  Result Value Ref Range Status   Specimen Description   Final    BLOOD RIGHT ARM Performed at [facility] [address], [place], [place] [address]    Special Requests   Final    BOTTLES DRAWN AEROBIC ONLY Blood Culture adequate volume Performed at [facility] [address], [place], [place] [address]    Culture   Final    NO GROWTH 2 DAYS Performed at [facility] [address], [place], [place] [address]    Report Status PENDING  Incomplete  Culture, blood (Routine X 2) w Reflex to ID Panel     Status: None (Preliminary result)   Collection Time: 07/02/18  8:16 AM  Result Value Ref Range Status   Specimen  Description   Final    BLOOD LEFT ARM Performed at [facility] [address] [place], [place] [address]    Special Requests   Final    BOTTLES DRAWN AEROBIC ONLY Blood Culture adequate volume Performed at [facility] [address], [place], [place] [address]    Culture   Final    NO GROWTH 2 DAYS Performed at [facility] [address] [place], [place] [address]    Report Status PENDING  Incomplete         Radiology Studies: Vas [place] Lower Extremity Venous (dvt)  Result Date: 07/03/2018  Lower Venous Study Indications: Edema.  Limitations: Body habitus and poor ultrasound/tissue interface. Performing Technologist: Abram S[place] RVS  Examination Guidelines: A complete evaluation includes B-mode imaging, spectral Doppler, color Doppler, and power Doppler as needed of all accessible portions of each vessel. Bilateral testing is considered an integral part of a complete examination. Limited examinations for reoccurring indications may be performed as noted.  Right Venous Findings: +---------+---------------+---------+-----------+----------+--------------+          CompressibilityPhasicitySpontaneityPropertiesSummary        +---------+---------------+---------+-----------+----------+--------------+ CFV      Full           Yes      Yes                                 +---------+---------------+---------+-----------+----------+--------------+ SFJ      Full                                                        +---------+---------------+---------+-----------+----------+--------------+ FV Prox  Full                                                        +---------+---------------+---------+-----------+----------+--------------+  FV Mid   Full                                                        +---------+---------------+---------+-----------+----------+--------------+ FV Distal                                              Not visualized +---------+---------------+---------+-----------+----------+--------------+ PFV      Full                                                        +---------+---------------+---------+-----------+----------+--------------+ POP      Full           Yes      Yes                                 +---------+---------------+---------+-----------+----------+--------------+ PTV                                                   Not visualized +---------+---------------+---------+-----------+----------+--------------+ PERO                                                  Not visualized +---------+---------------+---------+-----------+----------+--------------+  Left Venous Findings: +---------+---------------+---------+-----------+----------+--------------+          CompressibilityPhasicitySpontaneityPropertiesSummary        +---------+---------------+---------+-----------+----------+--------------+ CFV      Full           Yes      Yes                                 +---------+---------------+---------+-----------+----------+--------------+ SFJ      Full                                                        +---------+---------------+---------+-----------+----------+--------------+ FV Prox  Full                                                        +---------+---------------+---------+-----------+----------+--------------+ FV Mid   Full                                                        +---------+---------------+---------+-----------+----------+--------------+  FV Distal                                             Not visualized +---------+---------------+---------+-----------+----------+--------------+ PFV      Full                                                        +---------+---------------+---------+-----------+----------+--------------+ POP      Full           Yes      Yes                                  +---------+---------------+---------+-----------+----------+--------------+ PTV                                                   Not visualized +---------+---------------+---------+-----------+----------+--------------+ PERO                                                  Not visualized +---------+---------------+---------+-----------+----------+--------------+    Summary: Right: There is no evidence of deep vein thrombosis in the lower extremity. However, portions of this examination were limited- see technologist comments above. No cystic structure found in the popliteal fossa. Left: There is no evidence of deep vein thrombosis in the lower extremity. However, portions of this examination were limited- see technologist comments above. No cystic structure found in the popliteal fossa.  *See table(s) above for measurements and observations. Electronically signed by Deitra Mayo MD on 07/03/2018 at 4:32:39 PM.    Final         Scheduled Meds: . sodium chloride   Intravenous Once  . aspirin  325 mg Oral Daily  . atorvastatin  10 mg Oral QPM  . carvedilol  25 mg Oral BID WC  . doxycycline  100 mg Oral Q12H  . DULoxetine  60 mg Oral QHS  . enoxaparin (LOVENOX) injection  40 mg Subcutaneous Q24H  . furosemide  80 mg Intravenous Q12H  . insulin aspart  0-15 Units Subcutaneous TID WC  . insulin aspart  0-5 Units Subcutaneous QHS  . insulin glargine  60 Units Subcutaneous QHS  . polyethylene glycol  17 g Oral Daily  . senna-docusate  1 tablet Oral BID   Continuous Infusions: . sodium chloride 250 mL (07/04/18 1123)     LOS: 2 days    Time spent: 35 minutes.     Elmarie Shiley, MD [facility] Pager [phone]  If 7PM-7AM, please contact night-coverage [email or web address] Password [email or web address] 07/04/2018, 3:01 PM

## 2018-07-04 NOTE — Progress Notes (Signed)
Patient's CBG is 74. Assessed patient and she stated "I feel ok, I can just tell that I'm low" given 4oz of OJ, and dinner tray ordered.

## 2018-07-04 NOTE — Progress Notes (Signed)
Hypoglycemic Event  CBG:64  Treatment: 4 oz juice/soda  Symptoms: Shaky  Follow-up CBG: Time 0515 CBG Result 80 Possible Reasons for Event: Unknown  Comments/MD notified: K Shorr   Gonzella Lex

## 2018-07-05 LAB — BASIC METABOLIC PANEL
Anion gap: 7 (ref 5–15)
BUN: 33 mg/dL — ABNORMAL HIGH (ref 6–20)
CALCIUM: 8.2 mg/dL — AB (ref 8.9–10.3)
CO2: 29 mmol/L (ref 22–32)
Chloride: 104 mmol/L (ref 98–111)
Creatinine, Ser: 2.18 mg/dL — ABNORMAL HIGH (ref 0.44–1.00)
GFR calc Af Amer: 32 mL/min — ABNORMAL LOW (ref >60)
GFR calc non Af Amer: 28 mL/min — ABNORMAL LOW (ref >60)
Glucose, Bld: 139 mg/dL — ABNORMAL HIGH (ref 70–99)
Potassium: 4.2 mmol/L (ref 3.5–5.1)
Sodium: 140 mmol/L (ref 135–145)

## 2018-07-05 LAB — GLUCOSE, CAPILLARY
Glucose-Capillary: 111 mg/dL — ABNORMAL HIGH (ref 70–99)
Glucose-Capillary: 121 mg/dL — ABNORMAL HIGH (ref 70–99)
Glucose-Capillary: 140 mg/dL — ABNORMAL HIGH (ref 70–99)
Glucose-Capillary: 169 mg/dL — ABNORMAL HIGH (ref 70–99)
Glucose-Capillary: 174 mg/dL — ABNORMAL HIGH (ref 70–99)
Glucose-Capillary: 82 mg/dL (ref 70–99)

## 2018-07-05 LAB — OCCULT BLOOD X 1 CARD TO LAB, STOOL: Fecal Occult Bld: NEGATIVE

## 2018-07-05 NOTE — Evaluation (Signed)
Physical Therapy One Time Evaluation Patient Details Name: Jacqueline [name] MRN: [id number] DOB: 1980-10-29 Today's Date: 07/05/2018   History of Present Illness  37 year old with past medical history significant for hypertension, diabetes on insulin, obstructive sleep apnea, morbid obesity, chronic kidney disease and admitted for CHF.  Clinical Impression  Patient evaluated by Physical Therapy with no further acute PT needs identified. All education has been completed and the patient has no further questions.  Pt ambulated good distance and SPO2 remained 99% on room air.  See below for any follow-up Physical Therapy or equipment needs. PT is signing off. Thank you for this referral.     Follow Up Recommendations No PT follow up    Equipment Recommendations  None recommended by PT    Recommendations for Other Services       Precautions / Restrictions Precautions Precautions: None Restrictions Weight Bearing Restrictions: No      Mobility  Bed Mobility Overal bed mobility: Modified Independent                Transfers Overall transfer level: Modified independent                  Ambulation/Gait Ambulation/Gait assistance: Supervision;Modified independent (Device/Increase time) Gait Distance (Feet): 240 Feet Assistive device: None Gait Pattern/deviations: Step-through pattern     General Gait Details: steady gait, pt mildly SOB however Spo2 99% and HR 107 bpm  Stairs            Wheelchair Mobility    Modified Rankin (Stroke Patients Only)       Balance Overall balance assessment: No apparent balance deficits (not formally assessed)                                           Pertinent Vitals/Pain Pain Assessment: No/denies pain    Home Living Family/patient expects to be discharged to:: Private residence Living Arrangements: Spouse/significant other;Children Available Help at Discharge: Family Type of Home: House        Home Layout: One level Home Equipment: None      Prior Function Level of Independence: Independent               Hand Dominance        Extremity/Trunk Assessment        Lower Extremity Assessment Lower Extremity Assessment: Overall WFL for tasks assessed       Communication   Communication: No difficulties  Cognition Arousal/Alertness: Awake/alert Behavior During Therapy: WFL for tasks assessed/performed Overall Cognitive Status: Within Functional Limits for tasks assessed                                        General Comments      Exercises     Assessment/Plan    PT Assessment Patent does not need any further PT services  PT Problem List         PT Treatment Interventions      PT Goals (Current goals can be found in the Care Plan section)  Acute Rehab PT Goals PT Goal Formulation: All assessment and education complete, DC therapy    Frequency     Barriers to discharge        Co-evaluation  AM-PAC PT "6 Clicks" Mobility  Outcome Measure Help needed turning from your back to your side while in a flat bed without using bedrails?: None Help needed moving from lying on your back to sitting on the side of a flat bed without using bedrails?: None Help needed moving to and from a bed to a chair (including a wheelchair)?: None Help needed standing up from a chair using your arms (e.g., wheelchair or bedside chair)?: None Help needed to walk in hospital room?: None Help needed climbing 3-5 steps with a railing? : None 6 Click Score: 24    End of Session   Activity Tolerance: Patient tolerated treatment well Patient left: in bed;with call bell/phone within reach        Time: 2440-1027 PT Time Calculation (min) (ACUTE ONLY): 8 min   Charges:   PT Evaluation $PT Eval Low Complexity: [place], PT, DPT Acute Rehabilitation Services Office: [phone] Pager:  [phone]  Trena Platt 07/05/2018, 12:42 PM

## 2018-07-05 NOTE — Plan of Care (Signed)
Pt wasn't interested in eating dinner.

## 2018-07-05 NOTE — Progress Notes (Signed)
PROGRESS NOTE    Jacqueline [name]  GXQ:[id number] DOB: [birth year] DOA: 07/02/2018 PCP: Audley Hose, MD    Brief Narrative: 37 year old with past medical history significant for hypertension, diabetes on insulin [name] hemoglobin A1c more than 10, obstructive sleep apnea, morbid obesity, chronic kidney disease a stage III creatinine baseline 1.4---4.8, A. fib, diastolic heart failure who presents with 3 days of right lower extremity edema and pain. Patient was also found to have increased shortness of breath.  Chest x-ray with acute bronchitis versus pulmonary edema.   Assessment & Plan:   Principal Problem:   Acute on chronic diastolic (congestive) heart failure (HCC) Active Problems:   Morbid obesity ([place])   Type II diabetes mellitus with renal manifestations (HCC)   Normocytic anemia   Acute renal failure superimposed on stage 3 chronic kidney disease (HCC)   PAF (paroxysmal atrial fibrillation) (HCC)   Hypertensive urgency   HLD (hyperlipidemia)   Depression   OSA (obstructive sleep apnea)   Cellulitis of right lower extremity   Anemia   Type 2 diabetes mellitus with hyperglycemia, with long-term current use of insulin (HCC)   AKI (acute kidney injury) ([place])   CKD (chronic kidney disease), stage III (HCC)   Obesity, Class III, BMI 40-49.9 (morbid obesity) (HCC)   Insulin dependent diabetes mellitus (HCC)   Anemia of chronic disease   1-Acute on chronic diastolic heart failure exacerbation: Patient presented with shortness of breath, volume overload. She was a started on Bumex a few months ago by PCP. Dopplers negative for DVT. Continue with IV Lasix, continue to monitor creatinine. Weight today at 300.  Dry weight probably 295. Negative 5 L.  Down 5 pound.  Still not breathing at baseline.   2-Acute on chronic kidney disease stage III Per records creatinine baseline 1.7. Due to cardiorenal syndrome. Continue with IV Lasix, monitor renal function daily. Continue  with lasix. Cr stable.   Cellulitis of right lower extremity. Continue with doxycycline. Improving.   Paroxysmal A. Fib Continue with Coreg, and aspirin. Sinus rhythm during this admission.  Diabetes type 2 hold metformin. Continue to hold lantus.  SSI.   Anemia of chronic disease, iron deficiency anemia.  Iron level at 22. Hemoglobin dropped to 6.7, no signs of bleeding. Safe 1 unit of packed red blood cell. Received 1 dose of IV iron.  Morbid obesity/OSA on CPAP Body mass index is 51.04 kg/m. Reports holding asa due to planned weight loss surgery He was supposed to have surgery today.  RN Pressure Injury Documentation:    Malnutrition Type:      Malnutrition Characteristics:      Nutrition Interventions:     Estimated body mass index is 50.04 kg/m as calculated from the following:   Height as of this encounter: 5\' 5"  (1.651 m).   Weight as of this encounter: 136.4 kg.   DVT prophylaxis: Lovenox Code Status: full code.  Family Communication: care discussed with daughter  Disposition Plan: remain inpatient for IV lasix.   Consultants:   none   Procedures:   none   Antimicrobials;   Doxycycline   Subjective: Denies abdominal pain.  Breathing better not at baseline.    Objective: Vitals:   07/04/18 2009 07/04/18 2039 07/05/18 0524 07/05/18 1244  BP:  (!) 157/84 138/68 129/72  Pulse: 91 86 89 89  Resp: 18 20 20 19   Temp:  98.2 F (36.8 C) 97.9 F (36.6 C) 99 F (37.2 C)  TempSrc:  Oral Oral Oral  SpO2: 98% 100%  96% 100%  Weight:   (!) 136.4 kg   Height:        Intake/Output Summary ([name] 24 hours) at 07/05/2018 1559 [name] data filed at 07/05/2018 1400 Gross per 24 hour  Intake 240 ml  Output 2100 ml  Net -1860 ml   Filed Weights   07/03/18 0513 07/04/18 0439 07/05/18 0524  Weight: (!) 139.1 kg (!) 138.4 kg (!) 136.4 kg    Examination:  General exam: NAD Respiratory system: Bilateral crackles.  Cardiovascular system:  s 1, S 2 RRR Gastrointestinal system: BS present, soft, nt Central nervous system: non focal.  Extremities:  Plus 1 edema Skin: less redness LE extremity     Data Reviewed: I have personally reviewed following labs and imaging studies  CBC: Recent Labs  Lab 06/29/18 2250 07/02/18 0156 07/02/18 0816 07/03/18 0535 07/03/18 1821 07/04/18 0507  WBC 12.8* 12.6* 11.4* 9.4  --  10.0  NEUTROABS  --  8.1*  --   --   --  5.2  HGB 8.3* 7.4* 7.1* 6.7* 9.2* 8.3*  HCT 26.6* 23.2* 23.3* 22.1* 30.7* 26.8*  MCV 85.0 84.4 85.7 87.0  --  83.8  PLT 340 306 342 295  --  443   Basic Metabolic Panel: Recent Labs  Lab 06/29/18 2250 07/02/18 0156 07/02/18 0816 07/03/18 0535 07/04/18 0507 07/05/18 0415  NA 136 136  --  139 141 140  K 4.0 3.7  --  4.2 3.9 4.2  CL 105 107  --  108 106 104  CO2 24 22  --  25 25 29   GLUCOSE 167* 234*  --  202* 76 139*  BUN 32* 31*  --  34* 35* 33*  CREATININE 2.02* 2.00* 2.05* 2.38* 2.16* 2.18*  CALCIUM 9.4 8.1*  --  8.0* 8.1* 8.2*   GFR: Estimated Creatinine Clearance: 49.1 mL/min (A) (by C-G formula based on SCr of 2.18 mg/dL (H)). Liver Function Tests: Recent Labs  Lab 06/29/18 2250 07/02/18 0156  AST 16 18  ALT 13 13  ALKPHOS 70 63  BILITOT 0.3 0.3  PROT 7.6 6.3*  ALBUMIN 2.8* 2.5*   Recent Labs  Lab 06/29/18 2250  LIPASE 35   No results for input(s): AMMONIA in the [name] 168 hours. Coagulation Profile: No results for input(s): INR, PROTIME in the [name] 168 hours. Cardiac Enzymes: Recent Labs  Lab 06/29/18 2250 07/02/18 0156  TROPONINI <0.03 <0.03   BNP ([name] 3 results) No results for input(s): PROBNP in the [name] 8760 hours. HbA1C: No results for input(s): HGBA1C in the [name] 72 hours. CBG: Recent Labs  Lab 07/04/18 2033 07/05/18 0012 07/05/18 0523 07/05/18 0742 07/05/18 1125  GLUCAP 65* 174* 111* 82 140*   Lipid Profile: No results for input(s): CHOL, HDL, LDLCALC, TRIG, CHOLHDL, LDLDIRECT in the [name] 72 hours. Thyroid  Function Tests: No results for input(s): TSH, T4TOTAL, FREET4, T3FREE, THYROIDAB in the [name] 72 hours. Anemia Panel: No results for input(s): VITAMINB12, FOLATE, FERRITIN, TIBC, IRON, RETICCTPCT in the [name] 72 hours. Sepsis Labs: No results for input(s): PROCALCITON, LATICACIDVEN in the [name] 168 hours.  Recent Results (from the past 240 hour(s))  Urine culture     Status: Abnormal   Collection Time: 06/30/18  1:36 AM  Result Value Ref Range Status   Specimen Description   Final    URINE, RANDOM Performed at [facility], [address], [place], [place] [address]    Special Requests   Final    NONE Performed at  [facility], [address] [place], [place] [address]    Culture (A)  Final    <10,000 COLONIES/mL INSIGNIFICANT GROWTH Performed at [facility] [address] [place], [place] [address]    Report Status 07/01/2018 FINAL  Final  Culture, blood (Routine X 2) w Reflex to ID Panel     Status: None (Preliminary result)   Collection Time: 07/02/18  8:16 AM  Result Value Ref Range Status   Specimen Description   Final    BLOOD RIGHT ARM Performed at [facility] [address] [place], [place] [address]    Special Requests   Final    BOTTLES DRAWN AEROBIC ONLY Blood Culture adequate volume Performed at [facility] [address], [place], [place] [address]    Culture   Final    NO GROWTH 3 DAYS Performed at [facility], [place] [address] [place], [place] [address]    Report Status PENDING  Incomplete  Culture, blood (Routine X 2) w Reflex to ID Panel     Status: None (Preliminary result)   Collection Time: 07/02/18  8:16 AM  Result Value Ref Range Status   Specimen Description   Final    BLOOD LEFT ARM Performed at [facility] [address], [place], [place] [address]    Special Requests   Final    BOTTLES DRAWN AEROBIC ONLY Blood Culture adequate volume Performed  at [facility] [address] [place], [place] [address]    Culture   Final    NO GROWTH 3 DAYS Performed at [facility], [place] [address] [place], [place] [address]    Report Status PENDING  Incomplete         Radiology Studies: No results found.      Scheduled Meds: . sodium chloride   Intravenous Once  . aspirin  325 mg Oral Daily  . atorvastatin  10 mg Oral QPM  . carvedilol  25 mg Oral BID WC  . doxycycline  100 mg Oral Q12H  . DULoxetine  60 mg Oral QHS  . enoxaparin (LOVENOX) injection  40 mg Subcutaneous Q24H  . furosemide  80 mg Intravenous Q12H  . insulin aspart  0-15 Units Subcutaneous TID WC  . insulin aspart  0-5 Units Subcutaneous QHS  . polyethylene glycol  17 g Oral Daily  . senna-docusate  1 tablet Oral BID   Continuous Infusions: . sodium chloride Stopped (07/04/18 1200)     LOS: 3 days    Time spent: 35 minutes.     Elmarie Shiley, MD [facility] Pager [phone]  If 7PM-7AM, please contact night-coverage [email or web address] Password [email or web address] 07/05/2018, 3:59 PM

## 2018-07-05 NOTE — Progress Notes (Signed)
Report received from M. Patram, RN. No change from initial pm assessment. Will continue to monitor and follow the POC.

## 2018-07-06 LAB — BASIC METABOLIC PANEL
Anion gap: 10 (ref 5–15)
BUN: 30 mg/dL — ABNORMAL HIGH (ref 6–20)
CO2: 25 mmol/L (ref 22–32)
Calcium: 8.3 mg/dL — ABNORMAL LOW (ref 8.9–10.3)
Chloride: 103 mmol/L (ref 98–111)
Creatinine, Ser: 2.02 mg/dL — ABNORMAL HIGH (ref 0.44–1.00)
GFR calc Af Amer: 35 mL/min — ABNORMAL LOW (ref >60)
GFR calc non Af Amer: 31 mL/min — ABNORMAL LOW (ref >60)
GLUCOSE: 309 mg/dL — AB (ref 70–99)
Potassium: 4.1 mmol/L (ref 3.5–5.1)
Sodium: 138 mmol/L (ref 135–145)

## 2018-07-06 LAB — GLUCOSE, CAPILLARY: Glucose-Capillary: 156 mg/dL — ABNORMAL HIGH (ref 70–99)

## 2018-07-06 MED ORDER — FUROSEMIDE 40 MG PO TABS: 80.0000 mg | ORAL_TABLET | Freq: Every day | ORAL | Status: DC

## 2018-07-06 MED ORDER — SENNOSIDES-DOCUSATE SODIUM 8.6-50 MG PO TABS: 1.0000 | ORAL_TABLET | Freq: Two times a day (BID) | ORAL | 0 refills | Status: DC

## 2018-07-06 MED ORDER — TRESIBA FLEXTOUCH 200 UNIT/ML ~~LOC~~ SOPN: 35.0000 [IU] | PEN_INJECTOR | Freq: Every day | SUBCUTANEOUS | 1 refills | Status: DC

## 2018-07-06 MED ORDER — FUROSEMIDE 80 MG PO TABS: 80.0000 mg | ORAL_TABLET | Freq: Every day | ORAL | 0 refills | Status: DC

## 2018-07-06 MED ORDER — DOXYCYCLINE HYCLATE 100 MG PO TABS: 100.0000 mg | ORAL_TABLET | Freq: Two times a day (BID) | ORAL | 0 refills | Status: DC

## 2018-07-06 MED ORDER — INSULIN GLARGINE 100 UNIT/ML ~~LOC~~ SOLN
34.0000 [IU] | Freq: Every day | SUBCUTANEOUS | Status: DC
  Administered 2018-07-06: 34 [IU] via SUBCUTANEOUS
  Filled 2018-07-06: qty 0.34

## 2018-07-06 MED ORDER — POLYETHYLENE GLYCOL 3350 17 G PO PACK: 17.0000 g | PACK | Freq: Every day | ORAL | 0 refills | Status: DC

## 2018-07-06 NOTE — Discharge Summary (Signed)
Physician Discharge Summary  Latondra Gebhart VQQ:[id number] DOB: September 11, 1980 DOA: 07/02/2018  PCP: Audley Hose, MD  Admit date: 07/02/2018 Discharge date: 07/06/2018  Admitted From: Home  Disposition: home   Recommendations for Outpatient Follow-up:  1. Follow up with PCP in 1-2 weeks 2. Please obtain BMP/CBC in one week 3. Monitor volume status, renal function.  4. Needs close follow up with PCP and nephrology  5. Needs evaluation for iron deficiency anemia.   Home Health: none  Discharge Condition: stable.  CODE STATUS: full code.  Diet recommendation: Heart Healthy   Brief/Interim Summary: Brief Narrative: 37 year old with past medical history significant for hypertension, diabetes on insulin last hemoglobin A1c more than 10, obstructive sleep apnea, morbid obesity, chronic kidney disease a stage III creatinine baseline 4.3---3.2, A. fib, diastolic heart failure who presents with 3 days of right lower extremity edema and pain. Patient was also found to have increased shortness of breath.  Chest x-ray with acute bronchitis versus pulmonary edema.   Assessment & Plan:   Principal Problem:   Acute on chronic diastolic (congestive) heart failure (HCC) Active Problems:   Morbid obesity ([place])   Type II diabetes mellitus with renal manifestations (HCC)   Normocytic anemia   Acute renal failure superimposed on stage 3 chronic kidney disease (HCC)   PAF (paroxysmal atrial fibrillation) (HCC)   Hypertensive urgency   HLD (hyperlipidemia)   Depression   OSA (obstructive sleep apnea)   Cellulitis of right lower extremity   Anemia   Type 2 diabetes mellitus with hyperglycemia, with long-term current use of insulin (HCC)   AKI (acute kidney injury) ([place])   CKD (chronic kidney disease), stage III (HCC)   Obesity, Class III, BMI 40-49.9 (morbid obesity) (HCC)   Insulin dependent diabetes mellitus (HCC)   Anemia of chronic disease   1-Acute on chronic diastolic heart  failure exacerbation: Patient presented with shortness of breath, volume overload. She was a started on Bumex a few months ago by PCP. Dopplers negative for DVT. Continue with IV Lasix, continue to monitor creatinine. Weight today at 294.   Dry weight probably 295. Negative 5 L.  Discharge on lasix 80 mg daily. Close follow up on weight.   2-Acute on chronic kidney disease stage III Per records creatinine baseline 1.7. Due to cardiorenal syndrome. Continue with IV Lasix, monitor renal function daily. Continue with lasix. Cr stable.   Cellulitis of right lower extremity. Continue with doxycycline. Received 4 days, will provide one more day.  Improving.   Paroxysmal A. Fib Continue with Coreg, and aspirin. Sinus rhythm during this admission.  Diabetes type 2 hold metformin. Resume insulin lower dose.  SSI.   Anemia of chronic disease, iron deficiency anemia.  Iron level at 22. Hemoglobin dropped to 6.7, no signs of bleeding. Safe 1 unit of packed red blood cell. Received 1 dose of IV iron. needs out patient colonoscopy/   Morbid obesity/OSA on CPAP Body mass index is 51.04 kg/m. Reports holding asa due to planned weight loss surgery Follow up with sx for bariatric sx as planned.    Discharge Diagnoses:  Principal Problem:   Acute on chronic diastolic (congestive) heart failure (HCC) Active Problems:   Morbid obesity ([place])   Type II diabetes mellitus with renal manifestations (HCC)   Normocytic anemia   Acute renal failure superimposed on stage 3 chronic kidney disease (HCC)   PAF (paroxysmal atrial fibrillation) (HCC)   Hypertensive urgency   HLD (hyperlipidemia)   Depression   OSA (obstructive sleep  apnea)   Cellulitis of right lower extremity   Anemia   Type 2 diabetes mellitus with hyperglycemia, with long-term current use of insulin (HCC)   AKI (acute kidney injury) ([place])   CKD (chronic kidney disease), stage III (HCC)   Obesity, Class III, BMI  40-49.9 (morbid obesity) ([place])   Insulin dependent diabetes mellitus ([place])   Anemia of chronic disease    Discharge Instructions  Discharge Instructions    Diet - low sodium heart healthy   Complete by:  As directed    Increase activity slowly   Complete by:  As directed      Allergies as of 07/06/2018   No Known Allergies     Medication List    STOP taking these medications   amLODipine 10 MG tablet Commonly known as:  NORVASC   benzonatate 100 MG capsule Commonly known as:  TESSALON   bumetanide 1 MG tablet Commonly known as:  BUMEX   cephALEXin 500 MG capsule Commonly known as:  KEFLEX   metFORMIN 500 MG tablet Commonly known as:  GLUCOPHAGE   olmesartan 40 MG tablet Commonly known as:  BENICAR     TAKE these medications   aspirin 325 MG tablet Take 1 tablet (325 mg total) by mouth daily.   atorvastatin 10 MG tablet Commonly known as:  LIPITOR Take 10 mg by mouth every evening.   carvedilol 25 MG tablet Commonly known as:  COREG Take 1 tablet (25 mg total) by mouth 2 (two) times daily with a meal.   doxycycline 100 MG tablet Commonly known as:  VIBRA-TABS Take 1 tablet (100 mg total) by mouth every 12 (twelve) hours.   DULoxetine 60 MG capsule Commonly known as:  CYMBALTA Take 60 mg by mouth at bedtime.   furosemide 80 MG tablet Commonly known as:  LASIX Take 1 tablet (80 mg total) by mouth daily.   meclizine 25 MG tablet Commonly known as:  ANTIVERT Take 25 mg by mouth 2 (two) times daily as needed for dizziness.   ondansetron 4 MG disintegrating tablet Commonly known as:  ZOFRAN ODT Take 1 tablet (4 mg total) by mouth every 8 (eight) hours as needed for nausea or vomiting.   polyethylene glycol packet Commonly known as:  MIRALAX / GLYCOLAX Take 17 g by mouth daily. Start taking on:  July 07, 2018   senna-docusate 8.6-50 MG tablet Commonly known as:  Senokot-S Take 1 tablet by mouth 2 (two) times daily.   TRESIBA FLEXTOUCH 200  UNIT/ML Sopn Generic drug:  Insulin Degludec Inject 36 Units into the skin at bedtime. What changed:  how much to take      Follow-up Information    Bakare, Mobolaji B, MD. Schedule an appointment as soon as possible for a visit in 1 week(s).   Specialty:  Internal Medicine Contact information: [place] [address] [phone]        Minus Breeding, MD .   Specialty:  Cardiology Contact information: [address] [place] [address] [phone]        [place], [place]. Schedule an appointment as soon as possible for a visit in 1 week(s).   Contact information: [place] [address] [phone]          No Known Allergies  Consultations:  none   Procedures/Studies: Dg Chest 2 View  Result Date: 07/02/2018 CLINICAL DATA:  Shortness of breath. Bilateral lower extremity for 1 week. EXAM: CHEST - 2 VIEW  COMPARISON:  Radiographs 3 days ago 06/19/2018, multiple priors. Chest CT 02/14/2018 FINDINGS: Chronic cardiomegaly with unchanged mediastinal contours. Progressive peribronchial thickening from prior exam. Mild right perihilar atelectasis. No confluent airspace disease. No pleural effusion or pneumothorax. No acute osseous abnormalities. Multilevel degenerative change in the spine. IMPRESSION: 1. Progressive peribronchial thickening over the past 3 days, may be acute bronchitis or pulmonary edema. Minimal right perihilar atelectasis. 2. Chronic cardiomegaly. Electronically Signed   By: Keith Rake M.D.   On: 07/02/2018 02:34   Dg Chest 2 View  Result Date: 06/29/2018 CLINICAL DATA:  Leg pain and dizziness for a week. History of chronic bronchitis. EXAM: CHEST - 2 VIEW COMPARISON:  Chest radiograph May 03, 2018 FINDINGS: Cardiac silhouette is mildly enlarged and unchanged. Mediastinal silhouette is unremarkable for this low inspiratory examination with crowded vascular markings. No pleural effusion or  focal consolidation. Mild degenerative changes thoracic spine. Large body habitus. IMPRESSION: Mild cardiomegaly.  No acute pulmonary process. Electronically Signed   By: Elon Alas M.D.   On: 06/29/2018 23:45   Vas [place] Lower Extremity Venous (dvt)  Result Date: 07/03/2018  Lower Venous Study Indications: Edema.  Limitations: Body habitus and poor ultrasound/tissue interface. Performing Technologist: Abram Sander RVS  Examination Guidelines: A complete evaluation includes B-mode imaging, spectral Doppler, color Doppler, and power Doppler as needed of all accessible portions of each vessel. Bilateral testing is considered an integral part of a complete examination. Limited examinations for reoccurring indications may be performed as noted.  Right Venous Findings: +---------+---------------+---------+-----------+----------+--------------+          CompressibilityPhasicitySpontaneityPropertiesSummary        +---------+---------------+---------+-----------+----------+--------------+ CFV      Full           Yes      Yes                                 +---------+---------------+---------+-----------+----------+--------------+ SFJ      Full                                                        +---------+---------------+---------+-----------+----------+--------------+ FV Prox  Full                                                        +---------+---------------+---------+-----------+----------+--------------+ FV Mid   Full                                                        +---------+---------------+---------+-----------+----------+--------------+ FV Distal                                             Not visualized +---------+---------------+---------+-----------+----------+--------------+ PFV      Full                                                        +---------+---------------+---------+-----------+----------+--------------+  POP      Full            Yes      Yes                                 +---------+---------------+---------+-----------+----------+--------------+ PTV                                                   Not visualized +---------+---------------+---------+-----------+----------+--------------+ PERO                                                  Not visualized +---------+---------------+---------+-----------+----------+--------------+  Left Venous Findings: +---------+---------------+---------+-----------+----------+--------------+          CompressibilityPhasicitySpontaneityPropertiesSummary        +---------+---------------+---------+-----------+----------+--------------+ CFV      Full           Yes      Yes                                 +---------+---------------+---------+-----------+----------+--------------+ SFJ      Full                                                        +---------+---------------+---------+-----------+----------+--------------+ FV Prox  Full                                                        +---------+---------------+---------+-----------+----------+--------------+ FV Mid   Full                                                        +---------+---------------+---------+-----------+----------+--------------+ FV Distal                                             Not visualized +---------+---------------+---------+-----------+----------+--------------+ PFV      Full                                                        +---------+---------------+---------+-----------+----------+--------------+ POP      Full           Yes      Yes                                 +---------+---------------+---------+-----------+----------+--------------+ PTV  Not visualized +---------+---------------+---------+-----------+----------+--------------+ PERO                                                   Not visualized +---------+---------------+---------+-----------+----------+--------------+    Summary: Right: There is no evidence of deep vein thrombosis in the lower extremity. However, portions of this examination were limited- see technologist comments above. No cystic structure found in the popliteal fossa. Left: There is no evidence of deep vein thrombosis in the lower extremity. However, portions of this examination were limited- see technologist comments above. No cystic structure found in the popliteal fossa.  *See table(s) above for measurements and observations. Electronically signed by Deitra Mayo MD on 07/03/2018 at 4:32:39 PM.    Final       Subjective: Breathing better./   Discharge Exam: Vitals:   07/05/18 2149 07/06/18 0446  BP: 139/64 (!) 148/78  Pulse: 92 91  Resp:  18  Temp: 99 F (37.2 C) 98.5 F (36.9 C)  SpO2: 99% 98%   Vitals:   07/05/18 0524 07/05/18 1244 07/05/18 2149 07/06/18 0446  BP: 138/68 129/72 139/64 (!) 148/78  Pulse: 89 89 92 91  Resp: 20 19  18   Temp: 97.9 F (36.6 C) 99 F (37.2 C) 99 F (37.2 C) 98.5 F (36.9 C)  TempSrc: Oral Oral Oral Oral  SpO2: 96% 100% 99% 98%  Weight: (!) 136.4 kg   133.6 kg  Height:        General: Pt is alert, awake, not in acute distress Cardiovascular: RRR, S1/S2 +, no rubs, no gallops Respiratory: CTA bilaterally, no wheezing, no rhonchi Abdominal: Soft, NT, ND, bowel sounds + Extremities: no edema, no cyanosis    The results of significant diagnostics from this hospitalization (including imaging, microbiology, ancillary and laboratory) are listed below for reference.     Microbiology: Recent Results (from the past 240 hour(s))  Urine culture     Status: Abnormal   Collection Time: 06/30/18  1:36 AM  Result Value Ref Range Status   Specimen Description   Final    URINE, RANDOM Performed at [facility], [address] [place], [place] [address]    Special Requests    Final    NONE Performed at [facility], [address] [place], [place] [address]    Culture (A)  Final    <10,000 COLONIES/mL INSIGNIFICANT GROWTH Performed at [facility], [place] [address], [place], [place] [address]    Report Status 07/01/2018 FINAL  Final  Culture, blood (Routine X 2) w Reflex to ID Panel     Status: None (Preliminary result)   Collection Time: 07/02/18  8:16 AM  Result Value Ref Range Status   Specimen Description   Final    BLOOD RIGHT ARM Performed at [facility] [address] [place], [place] [address]    Special Requests   Final    BOTTLES DRAWN AEROBIC ONLY Blood Culture adequate volume Performed at [facility] [address] [place], [place] [address]    Culture   Final    NO GROWTH 4 DAYS Performed at [facility], [place] [address], [place], [place] [address]    Report Status PENDING  Incomplete  Culture, blood (Routine X 2) w Reflex to ID Panel     Status: None (  Preliminary result)   Collection Time: 07/02/18  8:16 AM  Result Value Ref Range Status   Specimen Description   Final    BLOOD LEFT ARM Performed at [facility] [address], [place], [place] [address]    Special Requests   Final    BOTTLES DRAWN AEROBIC ONLY Blood Culture adequate volume Performed at [facility] [address] [place], [place] [address]    Culture   Final    NO GROWTH 4 DAYS Performed at [facility], [place] [address], [place], [place] [address]    Report Status PENDING  Incomplete     Labs: BNP (last 3 results) Recent Labs    05/03/18 1358 06/29/18 2250 07/02/18 0156  BNP 61.4 52.1 70.6   Basic Metabolic Panel: Recent Labs  Lab 07/02/18 0156 07/02/18 0816 07/03/18 0535 07/04/18 0507 07/05/18 0415 07/06/18 0358  NA 136  --  139 141 140 138  K 3.7  --  4.2 3.9 4.2 4.1  CL 107  --  108 106 104 103  CO2 22  --  25 25 29 25   GLUCOSE  234*  --  202* 76 139* 309*  BUN 31*  --  34* 35* 33* 30*  CREATININE 2.00* 2.05* 2.38* 2.16* 2.18* 2.02*  CALCIUM 8.1*  --  8.0* 8.1* 8.2* 8.3*   Liver Function Tests: Recent Labs  Lab 06/29/18 2250 07/02/18 0156  AST 16 18  ALT 13 13  ALKPHOS 70 63  BILITOT 0.3 0.3  PROT 7.6 6.3*  ALBUMIN 2.8* 2.5*   Recent Labs  Lab 06/29/18 2250  LIPASE 35   No results for input(s): AMMONIA in the last 168 hours. CBC: Recent Labs  Lab 06/29/18 2250 07/02/18 0156 07/02/18 0816 07/03/18 0535 07/03/18 1821 07/04/18 0507  WBC 12.8* 12.6* 11.4* 9.4  --  10.0  NEUTROABS  --  8.1*  --   --   --  5.2  HGB 8.3* 7.4* 7.1* 6.7* 9.2* 8.3*  HCT 26.6* 23.2* 23.3* 22.1* 30.7* 26.8*  MCV 85.0 84.4 85.7 87.0  --  83.8  PLT 340 306 342 295  --  377   Cardiac Enzymes: Recent Labs  Lab 06/29/18 2250 07/02/18 0156  TROPONINI <0.03 <0.03   BNP: Invalid input(s): POCBNP CBG: Recent Labs  Lab 07/05/18 0742 07/05/18 1125 07/05/18 1630 07/05/18 2144 07/06/18 0734  GLUCAP 82 140* 121* 169* 156*   D-Dimer No results for input(s): DDIMER in the last 72 hours. Hgb A1c No results for input(s): HGBA1C in the last 72 hours. Lipid Profile No results for input(s): CHOL, HDL, LDLCALC, TRIG, CHOLHDL, LDLDIRECT in the last 72 hours. Thyroid function studies No results for input(s): TSH, T4TOTAL, T3FREE, THYROIDAB in the last 72 hours.  Invalid input(s): FREET3 Anemia work up No results for input(s): VITAMINB12, FOLATE, FERRITIN, TIBC, IRON, RETICCTPCT in the last 72 hours. Urinalysis    Component Value Date/Time   COLORURINE YELLOW 06/30/2018 0136   APPEARANCEUR HAZY (A) 06/30/2018 0136   LABSPEC 1.015 06/30/2018 0136   PHURINE 6.0 06/30/2018 0136   GLUCOSEU 250 (A) 06/30/2018 0136   HGBUR MODERATE (A) 06/30/2018 0136   BILIRUBINUR NEGATIVE 06/30/2018 0136   KETONESUR NEGATIVE 06/30/2018 0136   PROTEINUR >300 (A) 06/30/2018 0136   UROBILINOGEN 0.2 08/21/2012 2130   NITRITE NEGATIVE  06/30/2018 0136   LEUKOCYTESUR TRACE (A) 06/30/2018 0136   Sepsis Labs Invalid input(s): PROCALCITONIN,  WBC,  LACTICIDVEN Microbiology Recent Results (from the past 240  hour(s))  Urine culture     Status: Abnormal   Collection Time: 06/30/18  1:36 AM  Result Value Ref Range Status   Specimen Description   Final    URINE, RANDOM Performed at [facility], [address] [place], [place] [address]    Special Requests   Final    NONE Performed at [facility], [address] [place], [place] [address]    Culture (A)  Final    <10,000 COLONIES/mL INSIGNIFICANT GROWTH Performed at [facility], [place] [address], [place], [place] [address]    Report Status 07/01/2018 FINAL  Final  Culture, blood (Routine X 2) w Reflex to ID Panel     Status: None (Preliminary result)   Collection Time: 07/02/18  8:16 AM  Result Value Ref Range Status   Specimen Description   Final    BLOOD RIGHT ARM Performed at [facility] [address] [place], [place] [address]    Special Requests   Final    BOTTLES DRAWN AEROBIC ONLY Blood Culture adequate volume Performed at [facility] [address] [place], [place] [address]    Culture   Final    NO GROWTH 4 DAYS Performed at [facility], [place] [address], [place], [place] [address]    Report Status PENDING  Incomplete  Culture, blood (Routine X 2) w Reflex to ID Panel     Status: None (Preliminary result)   Collection Time: 07/02/18  8:16 AM  Result Value Ref Range Status   Specimen Description   Final    BLOOD LEFT ARM Performed at [facility] [address] [place], [place] [address]    Special Requests   Final    BOTTLES DRAWN AEROBIC ONLY Blood Culture adequate volume Performed at [facility] [address] [place], [place] [address]    Culture   Final    NO GROWTH 4 DAYS Performed at [facility], [place] [address] [place],  [address]    Report Status PENDING  Incomplete     Time coordinating discharge: 35 minutes.   SIGNED:   Elmarie Shiley, MD  [facility] 07/06/2018, 3:52 PM Pager   If 7PM-7AM, please contact night-coverage [email or web address] Password [email or web address]

## 2018-07-06 NOTE — Progress Notes (Signed)
Patient discharged home, IV removed - WNL.  Reviewed AVS and medications.  Educated on HF management at home with daily weights and low salt diet.  Verbalizes understanding.  Instructed to follow up with nephrology, PCP, and cardio.  No questions at this time,  Patient in NAD waiting for ride to arrive

## 2018-07-07 LAB — CULTURE, BLOOD (ROUTINE X 2)
Culture: NO GROWTH
Culture: NO GROWTH
Special Requests: ADEQUATE
Special Requests: ADEQUATE

## 2018-07-19 DIAGNOSIS — I129 Hypertensive chronic kidney disease with stage 1 through stage 4 chronic kidney disease, or unspecified chronic kidney disease: Secondary | ICD-10-CM | POA: Diagnosis not present

## 2018-07-19 DIAGNOSIS — Z6841 Body Mass Index (BMI) 40.0 and over, adult: Secondary | ICD-10-CM | POA: Diagnosis not present

## 2018-07-19 DIAGNOSIS — Z01818 Encounter for other preprocedural examination: Secondary | ICD-10-CM | POA: Diagnosis not present

## 2018-07-19 DIAGNOSIS — E1122 Type 2 diabetes mellitus with diabetic chronic kidney disease: Secondary | ICD-10-CM | POA: Diagnosis not present

## 2018-07-19 DIAGNOSIS — I10 Essential (primary) hypertension: Secondary | ICD-10-CM | POA: Diagnosis not present

## 2018-07-19 DIAGNOSIS — N183 Chronic kidney disease, stage 3 (moderate): Secondary | ICD-10-CM | POA: Diagnosis not present

## 2018-07-19 DIAGNOSIS — E1121 Type 2 diabetes mellitus with diabetic nephropathy: Secondary | ICD-10-CM | POA: Diagnosis not present

## 2018-07-19 DIAGNOSIS — I5032 Chronic diastolic (congestive) heart failure: Secondary | ICD-10-CM | POA: Diagnosis not present

## 2018-07-19 DIAGNOSIS — N182 Chronic kidney disease, stage 2 (mild): Secondary | ICD-10-CM | POA: Diagnosis not present

## 2018-07-19 DIAGNOSIS — E1165 Type 2 diabetes mellitus with hyperglycemia: Secondary | ICD-10-CM | POA: Diagnosis not present

## 2018-07-19 DIAGNOSIS — Z794 Long term (current) use of insulin: Secondary | ICD-10-CM | POA: Diagnosis not present

## 2018-07-24 NOTE — Progress Notes (Signed)
Cardiology Office Note   Date:  07/25/2018   ID:  Jacqueline [name], DOB [birth year], MRN [id number]  PCP:  Audley Hose, MD  Cardiologist: Hochrein    History of Present Illness: Jacqueline [name] is a 38 y.o. female who presents for post hospital follow up after being seen on consultation by Dr. Margaretann Loveless on 05/04/2018 for chest pain. She has a h/o HTN, OSA on CPAP, and Type II diabetes. She was found to be hypertension during evaluation and found to have elevated troponin thought to be demand ischemia from HTN.   Due to HTN and tachycardia, she was switched to coreg 3.125 mg BID from Propanolol 20 mg BID, she was continued on amlodipine 10 mg daily. It was recommended that she be placed on ARB if renal function tolerated this. Metformin was held due to renal function and she was resumed on insulin.  She was admitted on 07/02/2018 for acute on chronic diastolic CHF, treated with IV lasix and transitioned to 80 mg daily after 5 liter diureses. Due to anemia she was given 1 unit of PRB's She was also treated for cellulitis with doxycycline. Dry weight 295 lbs. On discharge was 294 lbs. Creatinine 2.02 on discharge.  She is also due to have Bariatric surgery with Dr. Evorn Gong on 08/06/2018 and is need of cardiac clearance. She is maintaining her weight and avoiding salt. She is medically compliant. She denies chest pain or palpitations.   Past Medical History:  Diagnosis Date  . Anemia   . Anxiety   . Chronic bronchitis ([place])   . Hypertension   . Increased frequency of headaches   . Morbid obesity ([place])   . Sleep apnea   . Type II diabetes mellitus ([place])     Past Surgical History:  Procedure Laterality Date  . BREAST REDUCTION SURGERY  03/21/2017  . CARDIAC CATHETERIZATION  01/06/2016  . CARDIAC CATHETERIZATION N/A 01/06/2016   Procedure: Left Heart Cath and Coronary Angiography;  Surgeon: Wellington Hampshire, MD;  Location: [facility];  Service: Cardiovascular;  Laterality: N/A;  . CESAREAN  SECTION  08/2014     Current Outpatient Medications  Medication Sig Dispense Refill  . aspirin 325 MG tablet Take 1 tablet (325 mg total) by mouth daily. (Patient not taking: Reported on 07/02/2018) 90 tablet 0  . atorvastatin (LIPITOR) 10 MG tablet Take 10 mg by mouth every evening.    . carvedilol (COREG) 25 MG tablet Take 1 tablet (25 mg total) by mouth 2 (two) times daily with a meal. 60 tablet 0  . DULoxetine (CYMBALTA) 60 MG capsule Take 60 mg by mouth at bedtime.    . furosemide (LASIX) 80 MG tablet Take 1 tablet (80 mg total) by mouth daily. 90 tablet 0  . meclizine (ANTIVERT) 25 MG tablet Take 25 mg by mouth 2 (two) times daily as needed for dizziness.     . ondansetron (ZOFRAN ODT) 4 MG disintegrating tablet Take 1 tablet (4 mg total) by mouth every 8 (eight) hours as needed for nausea or vomiting. 20 tablet 0   No current facility-administered medications for this visit.     Allergies:   Patient has no known allergies.    Social History:  The patient  reports that she has never smoked. She has never used smokeless tobacco. She reports that she does not drink alcohol or use drugs.   Family History:  The patient's family history includes Diabetes in her maternal grandmother and mother; Heart attack in an other  family member; Hypertension in her mother; Kidney disease in her maternal grandmother; Thyroid disease in her mother.    ROS: All other systems are reviewed and negative. Unless otherwise mentioned in H&P    PHYSICAL EXAM: VS:  BP 140/84   Pulse 91   Ht 5\' 5"  (1.651 m)   Wt 299 lb 9.6 oz (135.9 kg)   BMI 49.86 kg/m  , BMI Body mass index is 49.86 kg/m. GEN: Well nourished, well developed, in no acute distress, obese.  HEENT: normal Neck: no JVD, carotid bruits, or masses Cardiac: RRR tachycardic; no murmurs, rubs, or gallops,no edema  Respiratory:  Clear to auscultation bilaterally, normal work of breathing GI: soft, nontender, nondistended, + BS MS: no  deformity or atrophy Skin: warm and dry, no rash Neuro:  Strength and sensation are intact Psych: euthymic mood, full affect   EKG: Not completed.   Recent Labs: 05/04/2018: TSH 4.173 07/02/2018: ALT 13; B Natriuretic Peptide 66.9 07/04/2018: Hemoglobin 8.3; Platelets 377 07/06/2018: BUN 30; Creatinine, Ser 2.02; Potassium 4.1; Sodium 138    Lipid Panel    Component Value Date/Time   CHOL 134 05/04/2018 0217   TRIG 166 (H) 05/04/2018 0217   HDL 33 (L) 05/04/2018 0217   CHOLHDL 4.1 05/04/2018 0217   VLDL 33 05/04/2018 0217   LDLCALC 68 05/04/2018 0217      Wt Readings from Last 3 Encounters:  07/25/18 299 lb 9.6 oz (135.9 kg)  07/06/18 294 lb 9.6 oz (133.6 kg)  06/29/18 288 lb (130.6 kg)     Other studies Reviewed: Echocardiogram Jun 27, 2017: Study Conclusions: - Left ventricle: The cavity size was normal. Wall thickness was increased in a pattern of mild LVH. Systolic function was vigorous. The estimated ejection fraction was in the range of 65% to 70%. Wall motion was normal; there were no regional wall motion abnormalities. The study is not technically sufficient to allow evaluation of LV diastolic function. - Mitral valve: Mildly thickened leaflets . There was trivial regurgitation. - Left atrium: The atrium was normal in size. - Inferior vena cava: The vessel was normal in size. The respirophasic diameter changes were in the normal range (>= 50%), consistent with normal central venous pressure.  Impressions: - LVEF 65-70%, mild LVH, normal wall motion, trivial MR, normal LA size, normal IVC. _______________  Left Heart Catheterization 01/06/2016: Findings: 1. Normal coronary arteries. 2. Left ventricular angiography was not performed. EF was normal by echocardiogram recently. 3. Mildly elevated left ventricular end-diastolic pressure with mild gradient across the LVOT.  ASSESSMENT AND PLAN:  1. Chronic Diastolic CHF: She is  maintaining fluid balance and is compliant with her diuretics. Weight is essentially the same as her discharge weight. She will continue medication regimen as it stands now , but with Bariatric surgery she will need close monitoring of her status concerning BP and volume to avoid over diurese.   2. Hypertension: BP is elevated today. She is nervous about being here. She will continue coreg and lasix. Consider adding ARB is BP is not well controlled on follow up. However with weight loss, she will need close monitoring to avoid hypotension on current regimen.   3. Pre-Op Clearance:   Chart reviewed as part of pre-operative protocol coverage. Given past medical history and time since last visit, based on ACC/AHA guidelines, Jacqueline [name] would be at acceptable risk for the planned procedure without further cardiovascular testing. She is tachycardic on this office visit. HR will need to be monitored for need to have titration  of medications. However, this is seen only once on follow up.   I will route this recommendation to the requesting party via Epic fax function and remove from pre-op pool.  Please call with questions.  Phill Myron. [place] Pugh, ANP, AACC  07/25/2018, 8:37 AM

## 2018-07-25 ENCOUNTER — Encounter: Payer: Self-pay | Admitting: Adult Health

## 2018-07-25 ENCOUNTER — Ambulatory Visit (INDEPENDENT_AMBULATORY_CARE_PROVIDER_SITE_OTHER): Payer: BLUE CROSS/BLUE SHIELD | Admitting: Adult Health

## 2018-07-25 VITALS — BP 140/84 | HR 91 | Ht 65.0 in | Wt 299.6 lb

## 2018-07-25 DIAGNOSIS — Z01810 Encounter for preprocedural cardiovascular examination: Secondary | ICD-10-CM

## 2018-07-25 DIAGNOSIS — I10 Essential (primary) hypertension: Secondary | ICD-10-CM | POA: Diagnosis not present

## 2018-07-25 DIAGNOSIS — I5042 Chronic combined systolic (congestive) and diastolic (congestive) heart failure: Secondary | ICD-10-CM | POA: Diagnosis not present

## 2018-07-25 NOTE — Patient Instructions (Addendum)
Special Instructions: CLEARED FOR BARIATRIC SURGERY-KEEP AN EYE ON YOUR BP POST-SURGERY.  Follow-Up: You will need a follow up appointment in 3 months.  You may see Minus Breeding, MD Jory Sims, DNP, [place] or one of the following Advanced Practice Providers on your designated Care Team:  Jory Sims, DNP, AACC  Rosaria Ferries, PA-C  Medication Instructions:  NO CHANGES- Your physician recommends that you continue on your current medications as directed. Please refer to the Current Medication list given to you today. If you need a refill on your cardiac medications before your next appointment, please call your pharmacy.  Labwork: NONE ORDERED Take the provided lab slips with you to the lab for your blood draw.  When you have your labs (blood work) drawn today and your tests are completely normal, you will receive your results only by MyChart Message (if you have MyChart) -OR-  A paper copy in the mail.  If you have any lab test that is abnormal or we need to change your treatment, we will call you to review these results.   At [facility], you and your health needs are our priority.  As part of our continuing mission to provide you with exceptional heart care, we have created designated Provider Care Teams.  These Care Teams include your primary Cardiologist (physician) and Advanced Practice Providers (APPs -  Physician Assistants and Nurse Practitioners) who all work together to provide you with the care you need, when you need it.  Thank you for choosing [facility] at [facility]!!

## 2018-07-30 ENCOUNTER — Ambulatory Visit: Payer: BLUE CROSS/BLUE SHIELD | Attending: Physical Therapy | Admitting: Physical Therapy

## 2018-07-30 DIAGNOSIS — D509 Iron deficiency anemia, unspecified: Secondary | ICD-10-CM | POA: Diagnosis not present

## 2018-07-30 DIAGNOSIS — Z6841 Body Mass Index (BMI) 40.0 and over, adult: Secondary | ICD-10-CM | POA: Diagnosis not present

## 2018-08-01 DIAGNOSIS — G4733 Obstructive sleep apnea (adult) (pediatric): Secondary | ICD-10-CM | POA: Diagnosis not present

## 2018-08-02 DIAGNOSIS — I10 Essential (primary) hypertension: Secondary | ICD-10-CM | POA: Diagnosis not present

## 2018-08-02 DIAGNOSIS — E1165 Type 2 diabetes mellitus with hyperglycemia: Secondary | ICD-10-CM | POA: Diagnosis not present

## 2018-08-02 DIAGNOSIS — I5032 Chronic diastolic (congestive) heart failure: Secondary | ICD-10-CM | POA: Diagnosis not present

## 2018-08-02 DIAGNOSIS — D6489 Other specified anemias: Secondary | ICD-10-CM | POA: Diagnosis not present

## 2018-08-03 DIAGNOSIS — K2950 Unspecified chronic gastritis without bleeding: Secondary | ICD-10-CM | POA: Diagnosis not present

## 2018-08-03 DIAGNOSIS — Z538 Procedure and treatment not carried out for other reasons: Secondary | ICD-10-CM | POA: Diagnosis not present

## 2018-08-03 DIAGNOSIS — I130 Hypertensive heart and chronic kidney disease with heart failure and stage 1 through stage 4 chronic kidney disease, or unspecified chronic kidney disease: Secondary | ICD-10-CM | POA: Diagnosis not present

## 2018-08-03 DIAGNOSIS — E1140 Type 2 diabetes mellitus with diabetic neuropathy, unspecified: Secondary | ICD-10-CM | POA: Diagnosis not present

## 2018-08-03 DIAGNOSIS — G4730 Sleep apnea, unspecified: Secondary | ICD-10-CM | POA: Diagnosis not present

## 2018-08-03 DIAGNOSIS — K219 Gastro-esophageal reflux disease without esophagitis: Secondary | ICD-10-CM | POA: Diagnosis not present

## 2018-08-03 DIAGNOSIS — N182 Chronic kidney disease, stage 2 (mild): Secondary | ICD-10-CM | POA: Diagnosis not present

## 2018-08-03 DIAGNOSIS — Z6841 Body Mass Index (BMI) 40.0 and over, adult: Secondary | ICD-10-CM | POA: Diagnosis not present

## 2018-08-03 DIAGNOSIS — Z794 Long term (current) use of insulin: Secondary | ICD-10-CM | POA: Diagnosis not present

## 2018-08-03 DIAGNOSIS — E785 Hyperlipidemia, unspecified: Secondary | ICD-10-CM | POA: Diagnosis not present

## 2018-08-03 DIAGNOSIS — F419 Anxiety disorder, unspecified: Secondary | ICD-10-CM | POA: Diagnosis not present

## 2018-08-03 DIAGNOSIS — F329 Major depressive disorder, single episode, unspecified: Secondary | ICD-10-CM | POA: Diagnosis not present

## 2018-08-03 DIAGNOSIS — E1165 Type 2 diabetes mellitus with hyperglycemia: Secondary | ICD-10-CM | POA: Diagnosis not present

## 2018-08-03 DIAGNOSIS — I5030 Unspecified diastolic (congestive) heart failure: Secondary | ICD-10-CM | POA: Diagnosis not present

## 2018-08-03 DIAGNOSIS — D509 Iron deficiency anemia, unspecified: Secondary | ICD-10-CM | POA: Diagnosis not present

## 2018-08-03 DIAGNOSIS — E1122 Type 2 diabetes mellitus with diabetic chronic kidney disease: Secondary | ICD-10-CM | POA: Diagnosis not present

## 2018-08-03 DIAGNOSIS — K2940 Chronic atrophic gastritis without bleeding: Secondary | ICD-10-CM | POA: Diagnosis not present

## 2018-08-06 DIAGNOSIS — K5730 Diverticulosis of large intestine without perforation or abscess without bleeding: Secondary | ICD-10-CM | POA: Diagnosis not present

## 2018-08-06 DIAGNOSIS — Z79899 Other long term (current) drug therapy: Secondary | ICD-10-CM | POA: Diagnosis not present

## 2018-08-06 DIAGNOSIS — I5030 Unspecified diastolic (congestive) heart failure: Secondary | ICD-10-CM | POA: Diagnosis not present

## 2018-08-06 DIAGNOSIS — E1122 Type 2 diabetes mellitus with diabetic chronic kidney disease: Secondary | ICD-10-CM | POA: Diagnosis not present

## 2018-08-06 DIAGNOSIS — D649 Anemia, unspecified: Secondary | ICD-10-CM | POA: Diagnosis not present

## 2018-08-06 DIAGNOSIS — D509 Iron deficiency anemia, unspecified: Secondary | ICD-10-CM | POA: Diagnosis not present

## 2018-08-06 DIAGNOSIS — Z6841 Body Mass Index (BMI) 40.0 and over, adult: Secondary | ICD-10-CM | POA: Diagnosis not present

## 2018-08-06 DIAGNOSIS — N183 Chronic kidney disease, stage 3 (moderate): Secondary | ICD-10-CM | POA: Diagnosis not present

## 2018-08-06 DIAGNOSIS — Z794 Long term (current) use of insulin: Secondary | ICD-10-CM | POA: Diagnosis not present

## 2018-08-06 DIAGNOSIS — F419 Anxiety disorder, unspecified: Secondary | ICD-10-CM | POA: Diagnosis not present

## 2018-08-06 DIAGNOSIS — Z9989 Dependence on other enabling machines and devices: Secondary | ICD-10-CM | POA: Diagnosis not present

## 2018-08-06 DIAGNOSIS — E1140 Type 2 diabetes mellitus with diabetic neuropathy, unspecified: Secondary | ICD-10-CM | POA: Diagnosis not present

## 2018-08-06 DIAGNOSIS — I130 Hypertensive heart and chronic kidney disease with heart failure and stage 1 through stage 4 chronic kidney disease, or unspecified chronic kidney disease: Secondary | ICD-10-CM | POA: Diagnosis not present

## 2018-08-06 DIAGNOSIS — G4730 Sleep apnea, unspecified: Secondary | ICD-10-CM | POA: Diagnosis not present

## 2018-08-06 DIAGNOSIS — F329 Major depressive disorder, single episode, unspecified: Secondary | ICD-10-CM | POA: Diagnosis not present

## 2018-08-10 DIAGNOSIS — Z6841 Body Mass Index (BMI) 40.0 and over, adult: Secondary | ICD-10-CM | POA: Diagnosis not present

## 2018-08-10 DIAGNOSIS — Z01818 Encounter for other preprocedural examination: Secondary | ICD-10-CM | POA: Diagnosis not present

## 2018-08-10 DIAGNOSIS — E118 Type 2 diabetes mellitus with unspecified complications: Secondary | ICD-10-CM | POA: Diagnosis not present

## 2018-08-10 DIAGNOSIS — I10 Essential (primary) hypertension: Secondary | ICD-10-CM | POA: Diagnosis not present

## 2018-08-10 DIAGNOSIS — G4733 Obstructive sleep apnea (adult) (pediatric): Secondary | ICD-10-CM | POA: Diagnosis not present

## 2018-08-27 DIAGNOSIS — Z01818 Encounter for other preprocedural examination: Secondary | ICD-10-CM | POA: Diagnosis not present

## 2018-08-28 DIAGNOSIS — D631 Anemia in chronic kidney disease: Secondary | ICD-10-CM | POA: Diagnosis not present

## 2018-08-28 DIAGNOSIS — N189 Chronic kidney disease, unspecified: Secondary | ICD-10-CM | POA: Diagnosis not present

## 2018-08-28 DIAGNOSIS — I129 Hypertensive chronic kidney disease with stage 1 through stage 4 chronic kidney disease, or unspecified chronic kidney disease: Secondary | ICD-10-CM | POA: Diagnosis not present

## 2018-08-28 DIAGNOSIS — N183 Chronic kidney disease, stage 3 (moderate): Secondary | ICD-10-CM | POA: Diagnosis not present

## 2018-08-28 DIAGNOSIS — R809 Proteinuria, unspecified: Secondary | ICD-10-CM | POA: Diagnosis not present

## 2018-09-01 DIAGNOSIS — G4733 Obstructive sleep apnea (adult) (pediatric): Secondary | ICD-10-CM | POA: Diagnosis not present

## 2018-09-03 DIAGNOSIS — E669 Obesity, unspecified: Secondary | ICD-10-CM | POA: Diagnosis not present

## 2018-09-03 DIAGNOSIS — E785 Hyperlipidemia, unspecified: Secondary | ICD-10-CM | POA: Diagnosis not present

## 2018-09-03 DIAGNOSIS — E559 Vitamin D deficiency, unspecified: Secondary | ICD-10-CM | POA: Diagnosis not present

## 2018-09-03 DIAGNOSIS — N183 Chronic kidney disease, stage 3 (moderate): Secondary | ICD-10-CM | POA: Diagnosis not present

## 2018-09-03 DIAGNOSIS — I129 Hypertensive chronic kidney disease with stage 1 through stage 4 chronic kidney disease, or unspecified chronic kidney disease: Secondary | ICD-10-CM | POA: Diagnosis not present

## 2018-09-03 DIAGNOSIS — E1122 Type 2 diabetes mellitus with diabetic chronic kidney disease: Secondary | ICD-10-CM | POA: Diagnosis not present

## 2018-09-03 DIAGNOSIS — Z794 Long term (current) use of insulin: Secondary | ICD-10-CM | POA: Diagnosis not present

## 2018-09-03 DIAGNOSIS — F329 Major depressive disorder, single episode, unspecified: Secondary | ICD-10-CM | POA: Diagnosis not present

## 2018-09-03 DIAGNOSIS — Z6841 Body Mass Index (BMI) 40.0 and over, adult: Secondary | ICD-10-CM | POA: Diagnosis not present

## 2018-09-03 DIAGNOSIS — E1142 Type 2 diabetes mellitus with diabetic polyneuropathy: Secondary | ICD-10-CM | POA: Diagnosis not present

## 2018-09-03 DIAGNOSIS — G4733 Obstructive sleep apnea (adult) (pediatric): Secondary | ICD-10-CM | POA: Diagnosis not present

## 2018-09-03 DIAGNOSIS — Z79899 Other long term (current) drug therapy: Secondary | ICD-10-CM | POA: Diagnosis not present

## 2018-09-03 DIAGNOSIS — F419 Anxiety disorder, unspecified: Secondary | ICD-10-CM | POA: Diagnosis not present

## 2018-09-12 ENCOUNTER — Emergency Department (HOSPITAL_BASED_OUTPATIENT_CLINIC_OR_DEPARTMENT_OTHER): Payer: BLUE CROSS/BLUE SHIELD

## 2018-09-12 ENCOUNTER — Encounter (HOSPITAL_BASED_OUTPATIENT_CLINIC_OR_DEPARTMENT_OTHER): Payer: Self-pay | Admitting: Emergency Medicine

## 2018-09-12 ENCOUNTER — Emergency Department (HOSPITAL_BASED_OUTPATIENT_CLINIC_OR_DEPARTMENT_OTHER)
Admission: EM | Admit: 2018-09-12 | Discharge: 2018-09-12 | Disposition: A | Discharge status: Home / Self Care | Payer: BLUE CROSS/BLUE SHIELD | Attending: Emergency Medicine | Admitting: Emergency Medicine

## 2018-09-12 ENCOUNTER — Other Ambulatory Visit: Payer: Self-pay

## 2018-09-12 DIAGNOSIS — R600 Localized edema: Secondary | ICD-10-CM | POA: Diagnosis not present

## 2018-09-12 DIAGNOSIS — N183 Chronic kidney disease, stage 3 (moderate): Secondary | ICD-10-CM | POA: Insufficient documentation

## 2018-09-12 DIAGNOSIS — E860 Dehydration: Secondary | ICD-10-CM | POA: Insufficient documentation

## 2018-09-12 DIAGNOSIS — R42 Dizziness and giddiness: Secondary | ICD-10-CM | POA: Diagnosis not present

## 2018-09-12 DIAGNOSIS — R0602 Shortness of breath: Secondary | ICD-10-CM | POA: Diagnosis not present

## 2018-09-12 DIAGNOSIS — Z79899 Other long term (current) drug therapy: Secondary | ICD-10-CM | POA: Insufficient documentation

## 2018-09-12 DIAGNOSIS — E11649 Type 2 diabetes mellitus with hypoglycemia without coma: Secondary | ICD-10-CM | POA: Insufficient documentation

## 2018-09-12 DIAGNOSIS — E1122 Type 2 diabetes mellitus with diabetic chronic kidney disease: Secondary | ICD-10-CM | POA: Insufficient documentation

## 2018-09-12 DIAGNOSIS — R030 Elevated blood-pressure reading, without diagnosis of hypertension: Secondary | ICD-10-CM | POA: Diagnosis not present

## 2018-09-12 DIAGNOSIS — I129 Hypertensive chronic kidney disease with stage 1 through stage 4 chronic kidney disease, or unspecified chronic kidney disease: Secondary | ICD-10-CM | POA: Diagnosis not present

## 2018-09-12 DIAGNOSIS — E162 Hypoglycemia, unspecified: Secondary | ICD-10-CM

## 2018-09-12 DIAGNOSIS — E876 Hypokalemia: Secondary | ICD-10-CM | POA: Diagnosis not present

## 2018-09-12 DIAGNOSIS — Z7982 Long term (current) use of aspirin: Secondary | ICD-10-CM | POA: Diagnosis not present

## 2018-09-12 LAB — CBC
HCT: 30.2 % — ABNORMAL LOW (ref 36.0–46.0)
HEMOGLOBIN: 9.6 g/dL — AB (ref 12.0–15.0)
MCH: 27.7 pg (ref 26.0–34.0)
MCHC: 31.8 g/dL (ref 30.0–36.0)
MCV: 87 fL (ref 80.0–100.0)
Platelets: 393 10*3/uL (ref 150–400)
RBC: 3.47 MIL/uL — ABNORMAL LOW (ref 3.87–5.11)
RDW: 14.9 % (ref 11.5–15.5)
WBC: 10.6 10*3/uL — ABNORMAL HIGH (ref 4.0–10.5)
nRBC: 0 % (ref 0.0–0.2)

## 2018-09-12 LAB — PHOSPHORUS: Phosphorus: 3.6 mg/dL (ref 2.5–4.6)

## 2018-09-12 LAB — BASIC METABOLIC PANEL
Anion gap: 5 (ref 5–15)
BUN: 15 mg/dL (ref 6–20)
CO2: 24 mmol/L (ref 22–32)
Calcium: 8.6 mg/dL — ABNORMAL LOW (ref 8.9–10.3)
Chloride: 110 mmol/L (ref 98–111)
Creatinine, Ser: 2.82 mg/dL — ABNORMAL HIGH (ref 0.44–1.00)
GFR calc Af Amer: 24 mL/min — ABNORMAL LOW (ref >60)
GFR, EST NON AFRICAN AMERICAN: 20 mL/min — AB (ref >60)
Glucose, Bld: 61 mg/dL — ABNORMAL LOW (ref 70–99)
POTASSIUM: 3.1 mmol/L — AB (ref 3.5–5.1)
Sodium: 139 mmol/L (ref 135–145)

## 2018-09-12 LAB — CBG MONITORING, ED
Glucose-Capillary: 54 mg/dL — ABNORMAL LOW (ref 70–99)
Glucose-Capillary: 58 mg/dL — ABNORMAL LOW (ref 70–99)
Glucose-Capillary: 52 mg/dL — ABNORMAL LOW (ref 70–99)
Glucose-Capillary: 83 mg/dL (ref 70–99)

## 2018-09-12 LAB — BRAIN NATRIURETIC PEPTIDE: B Natriuretic Peptide: 44.7 pg/mL (ref 0.0–100.0)

## 2018-09-12 LAB — PREGNANCY, URINE: Preg Test, Ur: NEGATIVE

## 2018-09-12 LAB — MAGNESIUM: Magnesium: 2 mg/dL (ref 1.7–2.4)

## 2018-09-12 LAB — TROPONIN I: Troponin I: <0.03 ng/mL (ref <0.03)

## 2018-09-12 MED ORDER — LACTATED RINGERS IV BOLUS
1000.0000 mL | Freq: Once | INTRAVENOUS | Status: AC
  Administered 2018-09-12: 1000 mL via INTRAVENOUS

## 2018-09-12 MED ORDER — DEXTROSE 50 % IV SOLN: 25.0000 mL | Freq: Once | INTRAVENOUS | Status: DC

## 2018-09-12 MED ORDER — DEXTROSE 50 % IV SOLN
25.0000 mL | Freq: Once | INTRAVENOUS | Status: AC
  Administered 2018-09-12: 25 mL via INTRAVENOUS
  Filled 2018-09-12: qty 50

## 2018-09-12 MED ORDER — DEXTROSE 250 MG/ML IV SOLN
2.5000 g | Freq: Once | INTRAVENOUS | Status: AC
  Administered 2018-09-12: 2.5 g via INTRAVENOUS

## 2018-09-12 MED ORDER — DEXTROSE 250 MG/ML IV SOLN
INTRAVENOUS | Status: AC
  Administered 2018-09-12: 2.5 g via INTRAVENOUS
  Filled 2018-09-12: qty 10

## 2018-09-12 NOTE — ED Triage Notes (Signed)
Pt here post bariatric surgery on 2/17. Reports HTN and dizziness with falling. Taking carvedilol bid with no relief.

## 2018-09-12 NOTE — ED Provider Notes (Signed)
[facility] EMERGENCY DEPARTMENT Provider Note   CSN: [id number] Arrival date & time: 09/12/18  2694    History   Chief Complaint Chief Complaint  Patient presents with  . Hypertension  . Shortness of Breath    HPI Jacqueline [name] is a 38 y.o. female.     The history is provided by the patient and medical records. No language interpreter was used.  Hypertension  Associated symptoms include shortness of breath.  Shortness of Breath   Jacqueline [name] is a 38 y.o. female who presents to the Emergency Department complaining of dizziness. Presents to the emergency department complaining of dizziness described as feeling unsteady. Episodes have been waxing and waning since yesterday. She has mild associated headache. Episodes last 10 to 15 minutes at a time and she has associated flushing in her vision as well as decreased hearing during episodes. She also reports shortness of breath that is intermittent in nature. She denies any chest pain, fevers, abdominal pain. She has experienced numerous episodes of emesis. She is one week status post Roux-en-Y gastric bypass. She does have a history of hypertension. She had her blood pressure medications discontinued at the time of surgery and her carvedilol was restarted on Monday. She does report bilateral lower extremity edema that is been present for the last few days. Past Medical History:  Diagnosis Date  . Anemia   . Anxiety   . Chronic bronchitis ([place])   . Hypertension   . Increased frequency of headaches   . Morbid obesity ([place])   . Sleep apnea   . Type II diabetes mellitus [facility])     Patient Active Problem List   Diagnosis Date Noted  . AKI (acute kidney injury) ([place])   . CKD (chronic kidney disease), stage III ([place])   . Obesity, Class III, BMI 40-49.9 (morbid obesity) ([place])   . Insulin dependent diabetes mellitus ([place])   . Anemia of chronic disease   . HLD (hyperlipidemia) 07/02/2018  . Acute on chronic diastolic CHF  (congestive heart failure) ([place]) 07/02/2018  . Depression 07/02/2018  . OSA (obstructive sleep apnea) 07/02/2018  . Acute on chronic diastolic (congestive) heart failure ([place]) 07/02/2018  . Cellulitis of right lower extremity 07/02/2018  . Anemia 07/02/2018  . Type 2 diabetes mellitus with hyperglycemia, with long-term current use of insulin ([place]) 07/02/2018  . Chest pain 05/04/2018  . Hypertensive urgency 05/03/2018  . Left facial numbness 05/03/2018  . Headache 05/03/2018  . Elevated troponin 05/03/2018  . PAF (paroxysmal atrial fibrillation) ([place]) 05/25/2017  . Sepsis ([place]) 05/03/2017    Class: Present on Admission  . Acute renal failure superimposed on stage 3 chronic kidney disease ([place]) 05/03/2017    Class: Stage 3  . Neck pain   . Acidosis, metabolic   . Hyponatremia   . Type II diabetes mellitus with renal manifestations ([place]) 04/30/2017  . Normocytic anemia 04/30/2017  . Staphylococcus aureus bacteremia 03/15/2016  . Streptococcal bacteremia 03/15/2016  . Tachycardia 03/14/2016  . Fever 03/14/2016  . Bad headache 03/14/2016  . Morbid obesity ([place])   . Diabetes ([place]) 01/01/2014    Past Surgical History:  Procedure Laterality Date  . BARIATRIC SURGERY    . BREAST REDUCTION SURGERY  03/21/2017  . CARDIAC CATHETERIZATION  01/06/2016  . CARDIAC CATHETERIZATION N/A 01/06/2016   Procedure: Left Heart Cath and Coronary Angiography;  Surgeon: Wellington Hampshire, MD;  Location: [facility];  Service: Cardiovascular;  Laterality: N/A;  . CESAREAN SECTION  08/2014  OB History    Gravida  1   Para      Term      Preterm      AB      Living        SAB      TAB      Ectopic      Multiple      Live Births               Home Medications    Prior to Admission medications   Medication Sig Start Date End Date Taking? Authorizing Provider  atorvastatin (LIPITOR) 10 MG tablet Take 10 mg by mouth every evening. 03/08/18  Yes [provider]    carvedilol (COREG) 25 MG tablet Take 1 tablet (25 mg total) by mouth 2 (two) times daily with a meal. 05/06/18  Yes Lady Deutscher, MD  DULoxetine (CYMBALTA) 60 MG capsule Take 60 mg by mouth at bedtime. 03/08/18  Yes [provider]  omeprazole (PRILOSEC) 20 MG capsule Take 20 mg by mouth daily.   Yes [provider]  ondansetron (ZOFRAN ODT) 4 MG disintegrating tablet Take 1 tablet (4 mg total) by mouth every 8 (eight) hours as needed for nausea or vomiting. 06/30/18  Yes Maudie Flakes, MD  aspirin 325 MG tablet Take 1 tablet (325 mg total) by mouth daily. Patient not taking: Reported on 07/02/2018 05/07/18   Lady Deutscher, MD  furosemide (LASIX) 80 MG tablet Take 1 tablet (80 mg total) by mouth daily. 07/06/18   Regalado, Belkys A, MD  meclizine (ANTIVERT) 25 MG tablet Take 25 mg by mouth 2 (two) times daily as needed for dizziness.  06/20/18   [provider]    Family History Family History  Problem Relation Age of Onset  . Diabetes Mother   . Hypertension Mother   . Thyroid disease Mother   . Kidney disease Maternal Grandmother   . Diabetes Maternal Grandmother   . Heart attack Other     Social History Social History   Tobacco Use  . Smoking status: Never Smoker  . Smokeless tobacco: Never Used  Substance Use Topics  . Alcohol use: No  . Drug use: No     Allergies   Patient has no known allergies.   Review of Systems Review of Systems  Respiratory: Positive for shortness of breath.   All other systems reviewed and are negative.    Physical Exam Updated Vital Signs BP (!) 199/108   Pulse (!) 111   Temp 98.3 F (36.8 C) (Oral)   Resp 10   Ht 5\' 5"  (1.651 m)   Wt 131.5 kg   LMP 08/29/2018   SpO2 100%   BMI 48.26 kg/m   Physical Exam Vitals signs and nursing note reviewed.  Constitutional:      Appearance: She is well-developed.  HENT:     Head: Normocephalic and atraumatic.  Cardiovascular:     Rate and Rhythm:  Regular rhythm.     Heart sounds: No murmur.     Comments: tachycardic Pulmonary:     Effort: Pulmonary effort is normal. No respiratory distress.     Breath sounds: Normal breath sounds.  Abdominal:     Palpations: Abdomen is soft.     Tenderness: There is no abdominal tenderness. There is no guarding or rebound.  Musculoskeletal:        General: No tenderness.     Comments: 2+ pitting edema to BLE  Skin:  General: Skin is warm and dry.  Neurological:     Mental Status: She is alert and oriented to person, place, and time.     Comments: 5/5 strength in all four extremities with sensation to light touch intact in all four extremities.    Psychiatric:        Behavior: Behavior normal.      ED Treatments / Results  Labs (all labs ordered are listed, but only abnormal results are displayed) Labs Reviewed  BASIC METABOLIC PANEL - Abnormal; Notable for the following components:      Result Value   Potassium 3.1 (*)    Glucose, Bld 61 (*)    Creatinine, Ser 2.82 (*)    Calcium 8.6 (*)    GFR calc non Af Amer 20 (*)    GFR calc Af Amer 24 (*)    All other components within normal limits  CBC - Abnormal; Notable for the following components:   WBC 10.6 (*)    RBC 3.47 (*)    Hemoglobin 9.6 (*)    HCT 30.2 (*)    All other components within normal limits  CBG MONITORING, ED - Abnormal; Notable for the following components:   Glucose-Capillary 54 (*)    All other components within normal limits  CBG MONITORING, ED - Abnormal; Notable for the following components:   Glucose-Capillary 58 (*)    All other components within normal limits  CBG MONITORING, ED - Abnormal; Notable for the following components:   Glucose-Capillary 52 (*)    All other components within normal limits  TROPONIN I  PREGNANCY, URINE  BRAIN NATRIURETIC PEPTIDE  MAGNESIUM  PHOSPHORUS  CBG MONITORING, ED    EKG EKG Interpretation  Date/Time:  Wednesday September 12 2018 08:45:25 EST Ventricular  Rate:  99 PR Interval:    QRS Duration: 82 QT Interval:  355 QTC Calculation: 456 R Axis:   99 Text Interpretation:  Sinus rhythm Borderline right axis deviation Low voltage, precordial leads No significant change since last tracing Confirmed by Quintella Reichert [phone]) on 09/12/2018 8:50:59 AM Also confirmed by Quintella Reichert [phone]), editor Philomena Doheny [phone])  on 09/12/2018 12:41:14 PM   Radiology Dg Chest 2 View  Result Date: 09/12/2018 CLINICAL DATA:  Shortness of breath. EXAM: CHEST - 2 VIEW COMPARISON:  Radiographs of July 02, 2017. FINDINGS: The heart size and mediastinal contours are within normal limits. Both lungs are clear. No pneumothorax or pleural effusion is noted. The visualized skeletal structures are unremarkable. IMPRESSION: No active cardiopulmonary disease. Electronically Signed   By: Marijo Conception, M.D.   On: 09/12/2018 10:07   Ct Head Wo Contrast  Result Date: 09/12/2018 CLINICAL DATA:  Two days of dizziness and elevated blood pressure. EXAM: CT HEAD WITHOUT CONTRAST TECHNIQUE: Contiguous axial images were obtained from the base of the skull through the vertex without intravenous contrast. COMPARISON:  Brain MRI 05/04/2018 FINDINGS: Brain: No evidence of acute infarction, hemorrhage, hydrocephalus, extra-axial collection or mass lesion/mass effect. Vascular: No hyperdense vessel or unexpected calcification. Skull: Normal. Negative for fracture or focal lesion. Sinuses/Orbits: No acute finding. Other: None. IMPRESSION: Normal head CT. Electronically Signed   By: Fidela Salisbury M.D.   On: 09/12/2018 10:00   US Venous Img Lower Bilateral  Result Date: 09/12/2018 CLINICAL DATA:  Bilateral lower extremity edema. History of gastric bypass surgery on 09/03/2018. Evaluate for DVT. EXAM: BILATERAL LOWER EXTREMITY VENOUS DOPPLER ULTRASOUND TECHNIQUE: Gray-scale sonography with graded compression, as well as color Doppler and duplex ultrasound  were performed to evaluate  the lower extremity deep venous systems from the level of the common femoral vein and including the common femoral, femoral, profunda femoral, popliteal and calf veins including the posterior tibial, peroneal and gastrocnemius veins when visible. The superficial great saphenous vein was also interrogated. Spectral Doppler was utilized to evaluate flow at rest and with distal augmentation maneuvers in the common femoral, femoral and popliteal veins. COMPARISON:  None. FINDINGS: RIGHT LOWER EXTREMITY Common Femoral Vein: No evidence of thrombus. Normal compressibility, respiratory phasicity and response to augmentation. Saphenofemoral Junction: No evidence of thrombus. Normal compressibility and flow on color Doppler imaging. Profunda Femoral Vein: No evidence of thrombus. Normal compressibility and flow on color Doppler imaging. Femoral Vein: No evidence of thrombus. Normal compressibility, respiratory phasicity and response to augmentation. Popliteal Vein: No evidence of thrombus. Normal compressibility, respiratory phasicity and response to augmentation. Calf Veins: No evidence of thrombus. Normal compressibility and flow on color Doppler imaging. Superficial Great Saphenous Vein: No evidence of thrombus. Normal compressibility. Venous Reflux:  None. Other Findings:  None. LEFT LOWER EXTREMITY Common Femoral Vein: No evidence of thrombus. Normal compressibility, respiratory phasicity and response to augmentation. Saphenofemoral Junction: No evidence of thrombus. Normal compressibility and flow on color Doppler imaging. Profunda Femoral Vein: No evidence of thrombus. Normal compressibility and flow on color Doppler imaging. Femoral Vein: No evidence of thrombus. Normal compressibility, respiratory phasicity and response to augmentation. Popliteal Vein: No evidence of thrombus. Normal compressibility, respiratory phasicity and response to augmentation. Calf Veins: No evidence of thrombus. Normal compressibility and  flow on color Doppler imaging. Superficial Great Saphenous Vein: No evidence of thrombus. Normal compressibility. Venous Reflux:  None. Other Findings:  None. IMPRESSION: No evidence of DVT within either lower extremity. Electronically Signed   By: Sandi Mariscal M.D.   On: 09/12/2018 14:22    Procedures Procedures (including critical care time)  Medications Ordered in ED Medications  lactated ringers bolus 1,000 mL (0 mLs Intravenous Stopped 09/12/18 1300)  dextrose 25% IV injection (0 g Intravenous Stopped 09/12/18 1051)  lactated ringers bolus 1,000 mL ( Intravenous Stopped 09/12/18 1451)  dextrose 50 % solution 25 mL (25 mLs Intravenous Given 09/12/18 1308)     Initial Impression / Assessment and Plan / ED Course  I have reviewed the triage vital signs and the nursing notes.  Pertinent labs & imaging results that were available during my care of the patient were reviewed by me and considered in my medical decision making (see chart for details).       patient here for evaluation of intermittent dizziness, nine days post operative from laparoscopic Roux-en-Y gastric bypass. She is non-toxic appearing on evaluation. She is mildly tachycardic, but on record review she has been tachycardic on recent outpatient appointments as well. Presentation is not consistent with ACS, PE, CHF, CVA, hypertensive urgency. She is dehydrated on evaluation with mild hypokalemia as well as hypoglycemia. She is able to small sips of juice without vomiting in the emergency department. Her hypoglycemia resolved after treatment. Following IV fluids resolution of her hypoglycemia her dizziness did resolve. Labs demonstrate mild acute on chronic kidney injury. Discussed with patient findings of labs. Plan to discharge home with close outpatient bariatrics follow-up as well as return precautions.  Final Clinical Impressions(s) / ED Diagnoses   Final diagnoses:  Dehydration  Hypoglycemia  Hypokalemia    ED Discharge  Orders    None       Quintella Reichert, MD 09/12/18 [phone]

## 2018-09-12 NOTE — ED Notes (Signed)
EDP notified of CBG result.  Verbal order to give D50 25 ml and give an apple juice.

## 2018-09-12 NOTE — ED Notes (Signed)
Date and time results received: 09/12/18 1301 (use smartphrase ".now" to insert current time)  Test: CBG Critical Value:52  Name of Provider Notified:Dr. Ralene Bathe  Orders Received? Or Actions Taken?: yes.

## 2018-09-17 ENCOUNTER — Other Ambulatory Visit (HOSPITAL_COMMUNITY): Payer: Self-pay

## 2018-09-17 NOTE — Discharge Instructions (Signed)
Epoetin Alfa injection °What is this medicine? °EPOETIN ALFA (e POE e tin AL fa) helps your body make more red blood cells. This medicine is used to treat anemia caused by chronic kidney disease, cancer chemotherapy, or HIV-therapy. It may also be used before surgery if you have anemia. °This medicine may be used for other purposes; ask your health care provider or pharmacist if you have questions. °COMMON BRAND NAME(S): Epogen, Procrit, Retacrit °What should I tell my health care provider before I take this medicine? °They need to know if you have any of these conditions: °-cancer °-heart disease °-high blood pressure °-history of blood clots °-history of stroke °-low levels of folate, iron, or vitamin B12 in the blood °-seizures °-an unusual or allergic reaction to erythropoietin, albumin, benzyl alcohol, hamster proteins, other medicines, foods, dyes, or preservatives °-pregnant or trying to get pregnant °-breast-feeding °How should I use this medicine? °This medicine is for injection into a vein or under the skin. It is usually given by a health care professional in a hospital or clinic setting. °If you get this medicine at home, you will be taught how to prepare and give this medicine. Use exactly as directed. Take your medicine at regular intervals. Do not take your medicine more often than directed. °It is important that you put your used needles and syringes in a special sharps container. Do not put them in a trash can. If you do not have a sharps container, call your pharmacist or healthcare provider to get one. °A special MedGuide will be given to you by the pharmacist with each prescription and refill. Be sure to read this information carefully each time. °Talk to your pediatrician regarding the use of this medicine in children. While this drug may be prescribed for selected conditions, precautions do apply. °Overdosage: If you think you have taken too much of this medicine contact a poison control center  or emergency room at once. °NOTE: This medicine is only for you. Do not share this medicine with others. °What if I miss a dose? °If you miss a dose, take it as soon as you can. If it is almost time for your next dose, take only that dose. Do not take double or extra doses. °What may interact with this medicine? °Interactions have not been studied. °This list may not describe all possible interactions. Give your health care provider a list of all the medicines, herbs, non-prescription drugs, or dietary supplements you use. Also tell them if you smoke, drink alcohol, or use illegal drugs. Some items may interact with your medicine. °What should I watch for while using this medicine? °Your condition will be monitored carefully while you are receiving this medicine. °You may need blood work done while you are taking this medicine. °This medicine may cause a decrease in vitamin B6. You should make sure that you get enough vitamin B6 while you are taking this medicine. Discuss the foods you eat and the vitamins you take with your health care professional. °What side effects may I notice from receiving this medicine? °Side effects that you should report to your doctor or health care professional as soon as possible: °-allergic reactions like skin rash, itching or hives, swelling of the face, lips, or tongue °-seizures °-signs and symptoms of a blood clot such as breathing problems; changes in vision; chest pain; severe, sudden headache; pain, swelling, warmth in the leg; trouble speaking; sudden numbness or weakness of the face, arm or leg °-signs and symptoms of a stroke   like changes in vision; confusion; trouble speaking or understanding; severe headaches; sudden numbness or weakness of the face, arm or leg; trouble walking; dizziness; loss of balance or coordination °Side effects that usually do not require medical attention (report to your doctor or health care professional if they continue or are  bothersome): °-chills °-cough °-dizziness °-fever °-headaches °-joint pain °-muscle cramps °-muscle pain °-nausea, vomiting °-pain, redness, or irritation at site where injected °This list may not describe all possible side effects. Call your doctor for medical advice about side effects. You may report side effects to FDA at 1-800-FDA-1088. °Where should I keep my medicine? °Keep out of the reach of children. °Store in a refrigerator between 2 and 8 degrees C (36 and 46 degrees F). Do not freeze or shake. Throw away any unused portion if using a single-dose vial. Multi-dose vials can be kept in the refrigerator for up to 21 days after the initial dose. Throw away unused medicine. °NOTE: This sheet is a summary. It may not cover all possible information. If you have questions about this medicine, talk to your doctor, pharmacist, or health care provider. °© 2019 [employer]vier/Gold Standard (2017-02-10 08:35:19) °Ferumoxytol injection °What is this medicine? °FERUMOXYTOL is an iron complex. Iron is used to make healthy red blood cells, which carry oxygen and nutrients throughout the body. This medicine is used to treat iron deficiency anemia. °This medicine may be used for other purposes; ask your health care provider or pharmacist if you have questions. °COMMON BRAND NAME(S): Feraheme °What should I tell my health care provider before I take this medicine? °They need to know if you have any of these conditions: °-anemia not caused by low iron levels °-high levels of iron in the blood °-magnetic resonance imaging (MRI) test scheduled °-an unusual or allergic reaction to iron, other medicines, foods, dyes, or preservatives °-pregnant or trying to get pregnant °-breast-feeding °How should I use this medicine? °This medicine is for injection into a vein. It is given by a health care professional in a hospital or clinic setting. °Talk to your pediatrician regarding the use of this medicine in children. Special care may be  needed. °Overdosage: If you think you have taken too much of this medicine contact a poison control center or emergency room at once. °NOTE: This medicine is only for you. Do not share this medicine with others. °What if I miss a dose? °It is important not to miss your dose. Call your doctor or health care professional if you are unable to keep an appointment. °What may interact with this medicine? °This medicine may interact with the following medications: °-other iron products °This list may not describe all possible interactions. Give your health care provider a list of all the medicines, herbs, non-prescription drugs, or dietary supplements you use. Also tell them if you smoke, drink alcohol, or use illegal drugs. Some items may interact with your medicine. °What should I watch for while using this medicine? °Visit your doctor or healthcare professional regularly. Tell your doctor or healthcare professional if your symptoms do not start to get better or if they get worse. You may need blood work done while you are taking this medicine. °You may need to follow a special diet. Talk to your doctor. Foods that contain iron include: whole grains/cereals, dried fruits, beans, or peas, leafy green vegetables, and organ meats (liver, kidney). °What side effects may I notice from receiving this medicine? °Side effects that you should report to your doctor or health care   professional as soon as possible: °-allergic reactions like skin rash, itching or hives, swelling of the face, lips, or tongue °-breathing problems °-changes in blood pressure °-feeling faint or lightheaded, falls °-fever or chills °-flushing, sweating, or hot feelings °-swelling of the ankles or feet °Side effects that usually do not require medical attention (report to your doctor or health care professional if they continue or are bothersome): °-diarrhea °-headache °-nausea, vomiting °-stomach pain °This list may not describe all possible side effects.  Call your doctor for medical advice about side effects. You may report side effects to FDA at 1-800-FDA-1088. °Where should I keep my medicine? °This drug is given in a hospital or clinic and will not be stored at home. °NOTE: This sheet is a summary. It may not cover all possible information. If you have questions about this medicine, talk to your doctor, pharmacist, or health care provider. °© 2019 Elsevier/Gold Standard (2016-08-22 20:21:10) ° °

## 2018-09-18 ENCOUNTER — Encounter (HOSPITAL_COMMUNITY): Payer: Self-pay

## 2018-09-18 ENCOUNTER — Inpatient Hospital Stay (HOSPITAL_COMMUNITY)
Admission: RE | Admit: 2018-09-18 | Discharge: 2018-09-18 | Disposition: A | Discharge status: Home / Self Care | Payer: BLUE CROSS/BLUE SHIELD | Source: Ambulatory Visit | Attending: Nephrology | Admitting: Nephrology

## 2018-09-19 DIAGNOSIS — Z713 Dietary counseling and surveillance: Secondary | ICD-10-CM | POA: Diagnosis not present

## 2018-09-19 DIAGNOSIS — Z6841 Body Mass Index (BMI) 40.0 and over, adult: Secondary | ICD-10-CM | POA: Diagnosis not present

## 2018-09-21 ENCOUNTER — Encounter (HOSPITAL_COMMUNITY): Payer: Self-pay

## 2018-09-21 ENCOUNTER — Inpatient Hospital Stay (HOSPITAL_COMMUNITY): Admission: RE | Admit: 2018-09-21 | Payer: BLUE CROSS/BLUE SHIELD | Source: Ambulatory Visit

## 2018-09-25 ENCOUNTER — Encounter (HOSPITAL_COMMUNITY): Payer: BLUE CROSS/BLUE SHIELD

## 2018-09-28 ENCOUNTER — Encounter (HOSPITAL_COMMUNITY): Payer: BLUE CROSS/BLUE SHIELD

## 2018-09-30 DIAGNOSIS — G4733 Obstructive sleep apnea (adult) (pediatric): Secondary | ICD-10-CM | POA: Diagnosis not present

## 2018-10-01 ENCOUNTER — Encounter (HOSPITAL_COMMUNITY): Payer: BLUE CROSS/BLUE SHIELD

## 2018-10-02 ENCOUNTER — Encounter (HOSPITAL_COMMUNITY): Payer: BLUE CROSS/BLUE SHIELD

## 2018-10-03 ENCOUNTER — Other Ambulatory Visit: Payer: Self-pay

## 2018-10-03 ENCOUNTER — Encounter (HOSPITAL_COMMUNITY): Payer: Self-pay

## 2018-10-03 ENCOUNTER — Emergency Department (HOSPITAL_COMMUNITY)
Admission: EM | Admit: 2018-10-03 | Discharge: 2018-10-03 | Disposition: A | Discharge status: Home / Self Care | Payer: BLUE CROSS/BLUE SHIELD | Attending: Emergency Medicine | Admitting: Emergency Medicine

## 2018-10-03 ENCOUNTER — Emergency Department (HOSPITAL_COMMUNITY): Payer: BLUE CROSS/BLUE SHIELD

## 2018-10-03 DIAGNOSIS — Z79899 Other long term (current) drug therapy: Secondary | ICD-10-CM | POA: Diagnosis not present

## 2018-10-03 DIAGNOSIS — I10 Essential (primary) hypertension: Secondary | ICD-10-CM | POA: Diagnosis not present

## 2018-10-03 DIAGNOSIS — R109 Unspecified abdominal pain: Secondary | ICD-10-CM | POA: Insufficient documentation

## 2018-10-03 DIAGNOSIS — E119 Type 2 diabetes mellitus without complications: Secondary | ICD-10-CM | POA: Diagnosis not present

## 2018-10-03 DIAGNOSIS — R112 Nausea with vomiting, unspecified: Secondary | ICD-10-CM | POA: Diagnosis not present

## 2018-10-03 DIAGNOSIS — E279 Disorder of adrenal gland, unspecified: Secondary | ICD-10-CM | POA: Diagnosis not present

## 2018-10-03 LAB — CBC WITH DIFFERENTIAL/PLATELET
ABS IMMATURE GRANULOCYTES: 0.01 10*3/uL (ref 0.00–0.07)
Basophils Absolute: 0.1 10*3/uL (ref 0.0–0.1)
Basophils Relative: 1 %
Eosinophils Absolute: 0.3 10*3/uL (ref 0.0–0.5)
Eosinophils Relative: 4 %
HCT: 29.8 % — ABNORMAL LOW (ref 36.0–46.0)
Hemoglobin: 9.4 g/dL — ABNORMAL LOW (ref 12.0–15.0)
Immature Granulocytes: 0 %
Lymphocytes Relative: 38 %
Lymphs Abs: 2.6 10*3/uL (ref 0.7–4.0)
MCH: 27.5 pg (ref 26.0–34.0)
MCHC: 31.5 g/dL (ref 30.0–36.0)
MCV: 87.1 fL (ref 80.0–100.0)
Monocytes Absolute: 0.4 10*3/uL (ref 0.1–1.0)
Monocytes Relative: 6 %
Neutro Abs: 3.5 10*3/uL (ref 1.7–7.7)
Neutrophils Relative %: 51 %
Platelets: 402 10*3/uL — ABNORMAL HIGH (ref 150–400)
RBC: 3.42 MIL/uL — ABNORMAL LOW (ref 3.87–5.11)
RDW: 14 % (ref 11.5–15.5)
WBC: 6.9 10*3/uL (ref 4.0–10.5)
nRBC: 0 % (ref 0.0–0.2)

## 2018-10-03 LAB — URINALYSIS, ROUTINE W REFLEX MICROSCOPIC
BILIRUBIN URINE: NEGATIVE
Glucose, UA: 150 mg/dL — AB
Hgb urine dipstick: NEGATIVE
Ketones, ur: NEGATIVE mg/dL
Leukocytes,Ua: NEGATIVE
Nitrite: NEGATIVE
Protein, ur: >=300 mg/dL — AB
Specific Gravity, Urine: 1.026 (ref 1.005–1.030)
pH: 5 (ref 5.0–8.0)

## 2018-10-03 LAB — COMPREHENSIVE METABOLIC PANEL
ALBUMIN: 2.7 g/dL — AB (ref 3.5–5.0)
ALT: 15 U/L (ref 0–44)
AST: 19 U/L (ref 15–41)
Alkaline Phosphatase: 70 U/L (ref 38–126)
Anion gap: 8 (ref 5–15)
BILIRUBIN TOTAL: 0.9 mg/dL (ref 0.3–1.2)
BUN: 12 mg/dL (ref 6–20)
CO2: 21 mmol/L — ABNORMAL LOW (ref 22–32)
Calcium: 8.8 mg/dL — ABNORMAL LOW (ref 8.9–10.3)
Chloride: 111 mmol/L (ref 98–111)
Creatinine, Ser: 2.09 mg/dL — ABNORMAL HIGH (ref 0.44–1.00)
GFR calc Af Amer: 34 mL/min — ABNORMAL LOW (ref >60)
GFR calc non Af Amer: 29 mL/min — ABNORMAL LOW (ref >60)
Glucose, Bld: 123 mg/dL — ABNORMAL HIGH (ref 70–99)
Potassium: 3.9 mmol/L (ref 3.5–5.1)
Sodium: 140 mmol/L (ref 135–145)
Total Protein: 6.8 g/dL (ref 6.5–8.1)

## 2018-10-03 LAB — CBG MONITORING, ED: Glucose-Capillary: 103 mg/dL — ABNORMAL HIGH (ref 70–99)

## 2018-10-03 LAB — I-STAT BETA HCG BLOOD, ED (MC, WL, AP ONLY): I-stat hCG, quantitative: <5 m[IU]/mL (ref <5)

## 2018-10-03 MED ORDER — SODIUM CHLORIDE 0.9 % IV BOLUS: 1000.0000 mL | Freq: Once | INTRAVENOUS | Status: DC

## 2018-10-03 MED ORDER — METHOCARBAMOL 500 MG PO TABS: 500.0000 mg | ORAL_TABLET | Freq: Two times a day (BID) | ORAL | 0 refills | Status: DC

## 2018-10-03 MED ORDER — SODIUM CHLORIDE 0.9 % IV BOLUS
500.0000 mL | Freq: Once | INTRAVENOUS | Status: AC
  Administered 2018-10-03: 500 mL via INTRAVENOUS

## 2018-10-03 MED ORDER — MORPHINE SULFATE (PF) 4 MG/ML IV SOLN
4.0000 mg | Freq: Once | INTRAVENOUS | Status: AC
  Administered 2018-10-03: 4 mg via INTRAVENOUS
  Filled 2018-10-03: qty 1

## 2018-10-03 NOTE — ED Notes (Signed)
Patient verbalizes understanding of discharge instructions. Opportunity for questioning and answers were provided. Armband removed by staff, pt discharged from ED.  

## 2018-10-03 NOTE — ED Notes (Signed)
Patient transported to CT 

## 2018-10-03 NOTE — Discharge Instructions (Signed)
Return to the ED for worsening symptoms, injuries or falls, fever with abdominal pain, blood in your urine, blood in your stool, chest pain or shortness of breath.

## 2018-10-03 NOTE — ED Provider Notes (Signed)
[facility] EMERGENCY DEPARTMENT Provider Note   CSN: [id number] Arrival date & time: 10/03/18  4098    History   Chief Complaint Chief Complaint  Patient presents with   Flank Pain    R side    HPI Jacqueline [name] is a 38 y.o. female with a past medical history of HTN, obesity, diabetes, s/p gastric bypass surgery last month, who presents to ED for R flank pain for the past 2 days. She states that the pain is sharp, began as intermittent pain but has now been constant.  Pain is worse with movement and palpation.  She had a fever of T-max 100.9 yesterday.  She has tried Tylenol with no improvement in her symptoms.  Denies history of similar symptoms in the past.  She reports associated nausea, vomiting.  She has still been following a soft diet after her gastric bypass surgery.  Denies any other changes to her diet.  Denies any urinary symptoms, changes to bowel movements, vaginal complaints, URI symptoms, chest pain, shortness of breath, injuries or falls.     HPI  Past Medical History:  Diagnosis Date   Anemia    Anxiety    Chronic bronchitis ([place])    Hypertension    Increased frequency of headaches    Morbid obesity ([place])    Sleep apnea    Type II diabetes mellitus ([place])     Patient Active Problem List   Diagnosis Date Noted   AKI (acute kidney injury) ([place])    CKD (chronic kidney disease), stage III ([place])    Obesity, Class III, BMI 40-49.9 (morbid obesity) ([place])    Insulin dependent diabetes mellitus ([place])    Anemia of chronic disease    HLD (hyperlipidemia) 07/02/2018   Acute on chronic diastolic CHF (congestive heart failure) ([place]) 07/02/2018   Depression 07/02/2018   OSA (obstructive sleep apnea) 07/02/2018   Acute on chronic diastolic (congestive) heart failure ([place]) 07/02/2018   Cellulitis of right lower extremity 07/02/2018   Anemia 07/02/2018   Type 2 diabetes mellitus with hyperglycemia, with long-term current use of  insulin ([place]) 07/02/2018   Chest pain 05/04/2018   Hypertensive urgency 05/03/2018   Left facial numbness 05/03/2018   Headache 05/03/2018   Elevated troponin 05/03/2018   PAF (paroxysmal atrial fibrillation) ([place]) 05/25/2017   Sepsis ([place]) 05/03/2017    Class: Present on Admission   Acute renal failure superimposed on stage 3 chronic kidney disease ([place]) 05/03/2017    Class: Stage 3   Neck pain    Acidosis, metabolic    Hyponatremia    Type II diabetes mellitus with renal manifestations ([place]) 04/30/2017   Normocytic anemia 04/30/2017   Staphylococcus aureus bacteremia 03/15/2016   Streptococcal bacteremia 03/15/2016   Tachycardia 03/14/2016   Fever 03/14/2016   Bad headache 03/14/2016   Morbid obesity ([place])    Diabetes ([place]) 01/01/2014    Past Surgical History:  Procedure Laterality Date   BARIATRIC SURGERY     BREAST REDUCTION SURGERY  03/21/2017   CARDIAC CATHETERIZATION  01/06/2016   CARDIAC CATHETERIZATION N/A 01/06/2016   Procedure: Left Heart Cath and Coronary Angiography;  Surgeon: Wellington Hampshire, MD;  Location: [facility];  Service: Cardiovascular;  Laterality: N/A;   CESAREAN SECTION  08/2014     OB History    Gravida  1   Para      Term      Preterm      AB      Living  SAB      TAB      Ectopic      Multiple      Live Births               Home Medications    Prior to Admission medications   Medication Sig Start Date End Date Taking? Authorizing Provider  atorvastatin (LIPITOR) 10 MG tablet Take 10 mg by mouth every evening. 03/08/18  Yes [provider]  bismuth subsalicylate (PEPTO BISMOL) 262 MG/15ML suspension Take 30 mLs by mouth 2 (two) times daily.   Yes [provider]  carvedilol (COREG) 25 MG tablet Take 1 tablet (25 mg total) by mouth 2 (two) times daily with a meal. 05/06/18  Yes Lady Deutscher, MD  DULoxetine (CYMBALTA) 60 MG capsule Take 60 mg by mouth at  bedtime. 03/08/18  Yes [provider]  omeprazole (PRILOSEC) 20 MG capsule Take 20 mg by mouth daily.   Yes [provider]  ondansetron (ZOFRAN ODT) 4 MG disintegrating tablet Take 1 tablet (4 mg total) by mouth every 8 (eight) hours as needed for nausea or vomiting. 06/30/18  Yes Maudie Flakes, MD  ursodiol (ACTIGALL) 300 MG capsule Take 300 mg by mouth 2 (two) times daily. 08/18/18  Yes [provider]  Vitamin D, Ergocalciferol, (DRISDOL) 1.25 MG (50000 UT) CAPS capsule Take 50,000 Units by mouth once a week. Thursday 09/03/18  Yes [provider]  aspirin 325 MG tablet Take 1 tablet (325 mg total) by mouth daily. Patient not taking: Reported on 07/02/2018 05/07/18   Lady Deutscher, MD  furosemide (LASIX) 80 MG tablet Take 1 tablet (80 mg total) by mouth daily. Patient not taking: Reported on 10/03/2018 07/06/18   Regalado, Jerald Kief A, MD  methocarbamol (ROBAXIN) 500 MG tablet Take 1 tablet (500 mg total) by mouth 2 (two) times daily. 10/03/18   Delia Heady, PA-C    Family History Family History  Problem Relation Age of Onset   Diabetes Mother    Hypertension Mother    Thyroid disease Mother    Kidney disease Maternal Grandmother    Diabetes Maternal Grandmother    Heart attack Other     Social History Social History   Tobacco Use   Smoking status: Never Smoker   Smokeless tobacco: Never Used  Substance Use Topics   Alcohol use: No   Drug use: No     Allergies   Patient has no known allergies.   Review of Systems Review of Systems  Constitutional: Negative for appetite change, chills and fever.  HENT: Negative for ear pain, rhinorrhea, sneezing and sore throat.   Eyes: Negative for photophobia and visual disturbance.  Respiratory: Negative for cough, chest tightness, shortness of breath and wheezing.   Cardiovascular: Negative for chest pain and palpitations.  Gastrointestinal: Positive for nausea and vomiting. Negative  for abdominal pain, blood in stool, constipation and diarrhea.  Genitourinary: Positive for flank pain. Negative for dysuria, hematuria and urgency.  Musculoskeletal: Negative for myalgias.  Skin: Negative for rash.  Neurological: Negative for dizziness, weakness and light-headedness.     Physical Exam Updated Vital Signs BP (!) 188/103 (BP Location: Left Arm)    Pulse 95    Temp 98.4 F (36.9 C) (Oral)    Resp 16    SpO2 100%   Physical Exam Vitals signs and nursing note reviewed.  Constitutional:      General: She is not in acute distress.    Appearance: She is  well-developed.  HENT:     Head: Normocephalic and atraumatic.     Nose: Nose normal.  Eyes:     General: No scleral icterus.       Right eye: No discharge.        Left eye: No discharge.     Conjunctiva/sclera: Conjunctivae normal.  Neck:     Musculoskeletal: Normal range of motion and neck supple.  Cardiovascular:     Rate and Rhythm: Normal rate and regular rhythm.     Heart sounds: Normal heart sounds. No murmur. No friction rub. No gallop.   Pulmonary:     Effort: Pulmonary effort is normal. No respiratory distress.     Breath sounds: Normal breath sounds.  Abdominal:     General: Bowel sounds are normal. There is no distension.     Palpations: Abdomen is soft.     Tenderness: There is abdominal tenderness (R flank). There is no guarding or rebound.  Musculoskeletal: Normal range of motion.  Skin:    General: Skin is warm and dry.     Findings: No rash.  Neurological:     Mental Status: She is alert.     Motor: No abnormal muscle tone.     Coordination: Coordination normal.      ED Treatments / Results  Labs (all labs ordered are listed, but only abnormal results are displayed) Labs Reviewed  COMPREHENSIVE METABOLIC PANEL - Abnormal; Notable for the following components:      Result Value   CO2 21 (*)    Glucose, Bld 123 (*)    Creatinine, Ser 2.09 (*)    Calcium 8.8 (*)    Albumin 2.7 (*)     GFR calc non Af Amer 29 (*)    GFR calc Af Amer 34 (*)    All other components within normal limits  CBC WITH DIFFERENTIAL/PLATELET - Abnormal; Notable for the following components:   RBC 3.42 (*)    Hemoglobin 9.4 (*)    HCT 29.8 (*)    Platelets 402 (*)    All other components within normal limits  URINALYSIS, ROUTINE W REFLEX MICROSCOPIC - Abnormal; Notable for the following components:   APPearance CLOUDY (*)    Glucose, UA 150 (*)    Protein, ur >=300 (*)    Bacteria, UA RARE (*)    All other components within normal limits  CBG MONITORING, ED - Abnormal; Notable for the following components:   Glucose-Capillary 103 (*)    All other components within normal limits  I-STAT BETA HCG BLOOD, ED ([facility], [facility], [facility] ONLY)    EKG None  Radiology Ct Renal Stone Study  Result Date: 10/03/2018 CLINICAL DATA:  Right-sided pain for 3 days EXAM: CT ABDOMEN AND PELVIS WITHOUT CONTRAST TECHNIQUE: Multidetector CT imaging of the abdomen and pelvis was performed following the standard protocol without oral or IV contrast. COMPARISON:  The 03/06/2016 FINDINGS: Lower chest: Lung bases are clear. Hepatobiliary: There is a tiny cyst in the dome of the liver on the right anteriorly. No other focal liver lesions are appreciable on this noncontrast enhanced study. The gallbladder wall is not appreciably thickened. There are apparent small layering gallstones within the gallbladder. There is no biliary duct dilatation. Pancreas: No pancreatic mass or inflammatory focus. Spleen: No splenic lesions are evident. Adrenals/Urinary Tract: Right adrenal appears normal. There is stable mild hypertrophy of the left adrenal. Kidneys bilaterally show no evident renal mass or hydronephrosis on either side. There is no appreciable renal or  ureteral calculus on either side. Urinary bladder is midline with wall thickness within normal limits. Stomach/Bowel: There is no appreciable bowel wall or mesenteric thickening. There is  evidence of previous gastric bypass procedure without wall thickening or fluid in the perioperative areas. There is no evident bowel obstruction. No free air or portal venous air evident. Vascular/Lymphatic: No abdominal aortic aneurysm. No vascular lesions are appreciable on this noncontrast enhanced study. There is no adenopathy in the abdomen or pelvis. Reproductive: Uterus is anteverted. There is no evident pelvic mass. Other: Appendix appears normal. There is no abscess or ascites in the abdomen or pelvis. There is scarring at the level of the umbilicus. Musculoskeletal: There are no blastic or lytic bone lesions. There is no intramuscular lesion. IMPRESSION: 1. Status post gastric bypass procedure without complicating features. 2. No demonstrable bowel obstruction. No abscess in the abdomen or pelvis. Appendix appears within normal limits. 3.  No renal or ureteral calculus.  No hydronephrosis. 4. Stable mild left adrenal hypertrophy, a finding of doubtful clinical significance given the stability since 2017. Comment: A cause for patient's symptoms has not been established with this study. Electronically Signed   By: Lowella Grip III M.D.   On: 10/03/2018 11:46    Procedures Procedures (including critical care time)  Medications Ordered in ED Medications  morphine 4 MG/ML injection 4 mg (4 mg Intravenous Given 10/03/18 1039)  sodium chloride 0.9 % bolus 500 mL (0 mLs Intravenous Stopped 10/03/18 1146)     Initial Impression / Assessment and Plan / ED Course  I have reviewed the triage vital signs and the nursing notes.  Pertinent labs & imaging results that were available during my care of the patient were reviewed by me and considered in my medical decision making (see chart for details).        38 year old female with a past medical history of hypertension, obesity, diabetes, status post gastric bypass surgery last month presents to ED for right flank pain for the past 2 days.  Pain  is sharp, began as intermittent but now constant.  Worse with movement and palpation.  Reports associated fever yesterday.  No improvement with Tylenol.  On my exam there is tenderness palpation of the right flank without rebound or guarding.  No lower abdominal tenderness palpation or CVA tenderness.  Vital signs are within normal limits with exception of hypertension which I feel could be secondary to her discomfort as well.  CMP with creatinine of 2.09 which is similar to prior based on her CKD.  Hemoglobin of 9.4 which is also similar to prior.  Protein in her urine present in the past.  hCG is negative.  CT renal stone study is negative for acute abnormality or specific cause of her symptoms.  She is resting comfortably with pain medication and fluids given here.  Could be some component of musculoskeletal cause of her symptoms as there is reassuring work-up and imaging.  Will give muscle relaxer and have her follow-up with PCP.  Patient is hemodynamically stable, in NAD, and able to ambulate in the ED. Evaluation does not show pathology that would require ongoing emergent intervention or inpatient treatment. I explained the diagnosis to the patient. Pain has been managed and has no complaints prior to discharge. Patient is comfortable with above plan and is stable for discharge at this time. All questions were answered prior to disposition. Strict return precautions for returning to the ED were discussed. Encouraged follow up with PCP.    Portions  of this note were generated with [employer]. Dictation errors may occur despite best attempts at proofreading.   Final Clinical Impressions(s) / ED Diagnoses   Final diagnoses:  Flank pain    ED Discharge Orders         Ordered    methocarbamol (ROBAXIN) 500 MG tablet  2 times daily     10/03/18 1258           Delia Heady, PA-C 10/03/18 1300    Maudie Flakes, MD 10/03/18 1527

## 2018-10-03 NOTE — ED Triage Notes (Signed)
Pt has flank pain on the right side that has progressively gotten worse since Monday; Pt states that she does have hx CKD.

## 2018-10-05 ENCOUNTER — Encounter (HOSPITAL_COMMUNITY): Payer: BLUE CROSS/BLUE SHIELD

## 2018-10-24 ENCOUNTER — Telehealth: Payer: Self-pay

## 2018-10-24 NOTE — Telephone Encounter (Signed)
Virtual Visit Pre-Appointment Phone Call  Steps For Call: [place] PHONE   1. Confirm consent - "In the setting of the current Covid19 crisis, you are scheduled for a (phone or video) visit with your provider on (date) at (time).  Just as we do with many in-office visits, in order for you to participate in this visit, we must obtain consent.  If you'd like, I can send this to your mychart (if signed up) or email for you to review.  Otherwise, I can obtain your verbal consent now.  All virtual visits are billed to your insurance company just like a normal visit would be.  By agreeing to a virtual visit, we'd like you to understand that the technology does not allow for your provider to perform an examination, and thus may limit your provider's ability to fully assess your condition.  Finally, though the technology is pretty good, we cannot assure that it will always work on either your or our end, and in the setting of a video visit, we may have to convert it to a phone-only visit.  In either situation, we cannot ensure that we have a secure connection.  Are you willing to proceed?"  2. Give patient instructions for WebEx download to smartphone as below if video visit  3. Advise patient to be prepared with any vital sign or heart rhythm information, their current medicines, and a piece of paper and pen handy for any instructions they may receive the day of their visit  4. Inform patient they will receive a phone call 15 minutes prior to their appointment time (may be from unknown caller ID) so they should be prepared to answer  5. Confirm that appointment type is correct in Epic appointment notes (video vs telephone)    TELEPHONE CALL NOTE  Jacqueline [name] has been deemed a candidate for a follow-up tele-health visit to limit community exposure during the Covid-19 pandemic. I spoke with the patient via phone to ensure availability of phone/video source, confirm preferred email & phone  number, and discuss instructions and expectations.  I reminded Jacqueline [name] to be prepared with any vital sign and/or heart rhythm information that could potentially be obtained via home monitoring, at the time of her visit. I reminded Jacqueline [name] to expect a phone call at the time of her visit if her visit.  Did the patient verbally acknowledge consent to treatment?   [place] 10/24/2018 10:08 AM   DOWNLOADING THE WEBEX SOFTWARE TO SMARTPHONE  - If Apple, go to [employer] and type in WebEx in the search bar. [place] [employer], the blue/Makenze Ellett circle. The app is free but as with any other app downloads, their phone may require them to verify saved payment information or Apple password. The patient does NOT have to create an account.  - If Android, ask patient to go to [employer] and type in WebEx in the search bar. [place] [employer], the blue/[place] circle. The app is free but as with any other app downloads, their phone may require them to verify saved payment information or Android password. The patient does NOT have to create an account.   CONSENT FOR TELE-HEALTH VISIT - PLEASE REVIEW  I hereby voluntarily request, consent and authorize [facility] and its employed or contracted physicians, physician assistants, nurse practitioners or other licensed health care professionals (the Practitioner), to provide me with telemedicine health care services (the "Services") as deemed necessary by the  treating Practitioner. I acknowledge and consent to receive the Services by the Practitioner via telemedicine. I understand that the telemedicine visit will involve communicating with the Practitioner through live audiovisual communication technology and the disclosure of certain medical information by electronic transmission. I acknowledge that I have been given the opportunity to request an in-person assessment or other available alternative prior to the telemedicine  visit and am voluntarily participating in the telemedicine visit.  I understand that I have the right to withhold or withdraw my consent to the use of telemedicine in the course of my care at any time, without affecting my right to future care or treatment, and that the Practitioner or I may terminate the telemedicine visit at any time. I understand that I have the right to inspect all information obtained and/or recorded in the course of the telemedicine visit and may receive copies of available information for a reasonable fee.  I understand that some of the potential risks of receiving the Services via telemedicine include:  Marland Kitchen Delay or interruption in medical evaluation due to technological equipment failure or disruption; . Information transmitted may not be sufficient (e.g. poor resolution of images) to allow for appropriate medical decision making by the Practitioner; and/or  . In rare instances, security protocols could fail, causing a breach of personal health information.  Furthermore, I acknowledge that it is my responsibility to provide information about my medical history, conditions and care that is complete and accurate to the best of my ability. I acknowledge that Practitioner's advice, recommendations, and/or decision may be based on factors not within their control, such as incomplete or inaccurate data provided by me or distortions of diagnostic images or specimens that may result from electronic transmissions. I understand that the practice of medicine is not an exact science and that Practitioner makes no warranties or guarantees regarding treatment outcomes. I acknowledge that I will receive a copy of this consent concurrently upon execution via email to the email address I last provided but may also request a printed copy by calling the office of [facility] [facility].    I understand that my insurance will be billed for this visit.   I have read or had this consent read to me. . I  understand the contents of this consent, which adequately explains the benefits and risks of the Services being provided via telemedicine.  . I have been provided ample opportunity to ask questions regarding this consent and the Services and have had my questions answered to my satisfaction. . I give my informed consent for the services to be provided through the use of telemedicine in my medical care  By participating in this telemedicine visit I agree to the above.

## 2018-10-28 NOTE — Progress Notes (Signed)
Virtual Visit via Video Note   This visit type was conducted due to national recommendations for restrictions regarding the COVID-19 Pandemic (e.g. social distancing) in an effort to limit this patient's exposure and mitigate transmission in our community.  Due to her co-morbid illnesses, this patient is at least at moderate risk for complications without adequate follow up.  This format is felt to be most appropriate for this patient at this time.  All issues noted in this document were discussed and addressed.  A limited physical exam was performed with this format.  Please refer to the patient's chart for her consent to telehealth for [facility].   Evaluation Performed:  Follow-up visit  Date:  10/29/2018   ID:  Jacqueline [name], DOB Sep 26, 1980, MRN [id number]  Patient Location: Home  Provider Location: Home  PCP:  Audley Hose, MD  Cardiologist:  Minus Breeding, MD  Electrophysiologist:  None   Chief Complaint:   Dyspnea  History of Present Illness:    Jacqueline [name] is a 38 y.o. female who presents via audio/video conferencing for a telehealth visit today.    She presents for follow up of chest pain.  She was seen in the hospital for this on 05/04/2018. She has a h/o HTN, OSA on CPAP, and Type II diabetes. She was found to be hypertension during evaluation and found to have elevated troponin thought to be demand ischemia from HTN.  She was admitted on 07/02/2018 for acute on chronic diastolic CHF, treated with IV lasix and transitioned to 80 mg daily after 5 liter diureses. Due to anemia she was given 1 unit of PRB's She was also treated for cellulitis with doxycycline. Dry weight 295 lbs. On discharge was 294 lbs. Creatinine 2.02 on discharge.  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    Since she had her symptoms she is done quite well.  She is not had any palpitations, presyncope or syncope.  She is had no new shortness of breath, PND or orthopnea.  Her blood pressure is  elevated today but this is quite unusual.  She says it usually running in the 130s over 70s.  She did not take her medications yet this morning.  Of note her weight is down about 258 pounds.  She is breathing better.  She denies any PND or orthopnea.  She is had no edema.   The patient does not have symptoms concerning for COVID-19 infection (fever, chills, cough, or new shortness of breath).    Past Medical History:  Diagnosis Date  . Anemia   . Anxiety   . Chronic bronchitis ([place])   . Hypertension   . Increased frequency of headaches   . Morbid obesity ([place])   . Sleep apnea   . Type II diabetes mellitus ([place])    Past Surgical History:  Procedure Laterality Date  . BARIATRIC SURGERY    . BREAST REDUCTION SURGERY  03/21/2017  . CARDIAC CATHETERIZATION  01/06/2016  . CARDIAC CATHETERIZATION N/A 01/06/2016   Procedure: Left Heart Cath and Coronary Angiography;  Surgeon: Wellington Hampshire, MD;  Location: [facility];  Service: Cardiovascular;  Laterality: N/A;  . CESAREAN SECTION  08/2014     Current Meds  Medication Sig  . atorvastatin (LIPITOR) 10 MG tablet Take 10 mg by mouth every evening.  . carvedilol (COREG) 25 MG tablet Take 1 tablet (25 mg total) by mouth 2 (two) times daily with a meal.  . DULoxetine (CYMBALTA) 60 MG capsule Take 60  mg by mouth at bedtime.  . ursodiol (ACTIGALL) 300 MG capsule Take 300 mg by mouth 2 (two) times daily.  . Vitamin D, Ergocalciferol, (DRISDOL) 1.25 MG (50000 UT) CAPS capsule Take 50,000 Units by mouth once a week. Thursday     Allergies:   Patient has no known allergies.     Social History   Tobacco Use  . Smoking status: Never Smoker  . Smokeless tobacco: Never Used  Substance Use Topics  . Alcohol use: No  . Drug use: No     Family Hx: The patient's family history includes Diabetes in her maternal grandmother and mother; Heart attack in an other family member; Hypertension in her mother; Kidney disease in her maternal  grandmother; Thyroid disease in her mother.  ROS:   Please see the history of present illness.    As stated in the HPI and negative for all other systems.   Prior CV studies:   The following studies were reviewed today:  Labs  Labs/Other Tests and Data Reviewed:    EKG:  No ECG reviewed.  Recent Labs: 05/04/2018: TSH 4.173 09/12/2018: B Natriuretic Peptide 44.7; Magnesium 2.0 10/03/2018: ALT 15; BUN 12; Creatinine, Ser 2.09; Hemoglobin 9.4; Platelets 402; Potassium 3.9; Sodium 140   Recent Lipid Panel Lab Results  Component Value Date/Time   CHOL 134 05/04/2018 02:17 AM   TRIG 166 (H) 05/04/2018 02:17 AM   HDL 33 (L) 05/04/2018 02:17 AM   CHOLHDL 4.1 05/04/2018 02:17 AM   LDLCALC 68 05/04/2018 02:17 AM    Wt Readings from Last 3 Encounters:  10/29/18 258 lb (117 kg)  09/12/18 290 lb (131.5 kg)  07/25/18 299 lb 9.6 oz (135.9 kg)     Objective:    Vital Signs:  BP (!) 175/98   Pulse 96   Ht 5\' 4"  (1.626 m)   Wt 258 lb (117 kg)   BMI 44.29 kg/m    Well nourished, well developed female in no acute distress.   ASSESSMENT & PLAN:    1. Chronic Diastolic CHF:   Symptomatically she appears to be euvolemic.  No change in therapy.  2. Hypertension: Her blood pressure is elevated today.  She is in keep a blood pressure diary twice a day to 3 times a day and send this to me.  She says it is always well controlled when she takes it after medications.  3.  CKD:    Her last creatinine was 2.82.  She is seeing her nephrologist soon.  COVID-19 Education: The signs and symptoms of COVID-19 were discussed with the patient and how to seek care for testing (follow up with PCP or arrange E-visit).  She is working from home.  The importance of social distancing was discussed today.  Time:   Today, I have spent 15 minutes with the patient with telehealth technology discussing the above problems.     Medication Adjustments/Labs and Tests Ordered: Current medicines are reviewed  at length with the patient today.  Concerns regarding medicines are outlined above.  Tests Ordered: No orders of the defined types were placed in this encounter.  Medication Changes: No orders of the defined types were placed in this encounter.   Disposition:  Follow up in 6 month(s)  Signed, Minus Breeding, MD  10/29/2018 10:30 AM     [facility]

## 2018-10-29 ENCOUNTER — Telehealth (INDEPENDENT_AMBULATORY_CARE_PROVIDER_SITE_OTHER): Payer: BLUE CROSS/BLUE SHIELD | Admitting: Cardiology

## 2018-10-29 ENCOUNTER — Encounter: Payer: Self-pay | Admitting: Cardiology

## 2018-10-29 VITALS — BP 175/98 | HR 96 | Ht 64.0 in | Wt 258.0 lb

## 2018-10-29 DIAGNOSIS — I5032 Chronic diastolic (congestive) heart failure: Secondary | ICD-10-CM

## 2018-10-29 DIAGNOSIS — I10 Essential (primary) hypertension: Secondary | ICD-10-CM

## 2018-10-29 NOTE — Patient Instructions (Signed)
Medication Instructions:  Continue current medications  If you need a refill on your cardiac medications before your next appointment, please call your pharmacy.  Labwork: None Ordered   Testing/Procedures: None Ordered  Follow-Up: You will need a follow up appointment in 6 months.  Please call our office 2 months in advance to schedule this appointment.  You may see James Hochrein, MD or one of the following Advanced Practice Providers on your designated Care Team:   Rhonda Barrett, PA-C . Kathryn Lawrence, DNP, ANP    At [facility], you and your health needs are our priority.  As part of our continuing mission to provide you with exceptional heart care, we have created designated Provider Care Teams.  These Care Teams include your primary Cardiologist (physician) and Advanced Practice Providers (APPs -  Physician Assistants and Nurse Practitioners) who all work together to provide you with the care you need, when you need it.  Thank you for choosing [facility] at [facility]!!     

## 2018-10-31 DIAGNOSIS — G4733 Obstructive sleep apnea (adult) (pediatric): Secondary | ICD-10-CM | POA: Diagnosis not present

## 2018-11-20 ENCOUNTER — Other Ambulatory Visit: Payer: Self-pay | Admitting: Nephrology

## 2018-11-20 DIAGNOSIS — N183 Chronic kidney disease, stage 3 unspecified: Secondary | ICD-10-CM

## 2018-11-28 ENCOUNTER — Ambulatory Visit
Admission: RE | Admit: 2018-11-28 | Discharge: 2018-11-28 | Disposition: A | Discharge status: Home / Self Care | Payer: BLUE CROSS/BLUE SHIELD | Source: Ambulatory Visit | Attending: Nephrology | Admitting: Nephrology

## 2018-11-28 DIAGNOSIS — N183 Chronic kidney disease, stage 3 unspecified: Secondary | ICD-10-CM

## 2018-11-30 DIAGNOSIS — G4733 Obstructive sleep apnea (adult) (pediatric): Secondary | ICD-10-CM | POA: Diagnosis not present

## 2018-12-31 DIAGNOSIS — G4733 Obstructive sleep apnea (adult) (pediatric): Secondary | ICD-10-CM | POA: Diagnosis not present

## 2019-01-03 DIAGNOSIS — E559 Vitamin D deficiency, unspecified: Secondary | ICD-10-CM | POA: Diagnosis not present

## 2019-01-03 DIAGNOSIS — I129 Hypertensive chronic kidney disease with stage 1 through stage 4 chronic kidney disease, or unspecified chronic kidney disease: Secondary | ICD-10-CM | POA: Diagnosis not present

## 2019-01-03 DIAGNOSIS — D631 Anemia in chronic kidney disease: Secondary | ICD-10-CM | POA: Diagnosis not present

## 2019-01-03 DIAGNOSIS — N183 Chronic kidney disease, stage 3 (moderate): Secondary | ICD-10-CM | POA: Diagnosis not present

## 2019-01-03 DIAGNOSIS — R809 Proteinuria, unspecified: Secondary | ICD-10-CM | POA: Diagnosis not present

## 2019-01-03 DIAGNOSIS — N189 Chronic kidney disease, unspecified: Secondary | ICD-10-CM | POA: Diagnosis not present

## 2019-01-30 DIAGNOSIS — G4733 Obstructive sleep apnea (adult) (pediatric): Secondary | ICD-10-CM | POA: Diagnosis not present

## 2019-03-02 DIAGNOSIS — G4733 Obstructive sleep apnea (adult) (pediatric): Secondary | ICD-10-CM | POA: Diagnosis not present

## 2019-03-15 ENCOUNTER — Emergency Department (HOSPITAL_COMMUNITY): Payer: BC Managed Care – PPO

## 2019-03-15 ENCOUNTER — Emergency Department (HOSPITAL_COMMUNITY)
Admission: EM | Admit: 2019-03-15 | Discharge: 2019-03-15 | Disposition: A | Discharge status: Home / Self Care | Payer: BC Managed Care – PPO | Attending: Emergency Medicine | Admitting: Emergency Medicine

## 2019-03-15 ENCOUNTER — Encounter (HOSPITAL_COMMUNITY): Payer: Self-pay | Admitting: Emergency Medicine

## 2019-03-15 ENCOUNTER — Emergency Department (HOSPITAL_BASED_OUTPATIENT_CLINIC_OR_DEPARTMENT_OTHER): Payer: BC Managed Care – PPO

## 2019-03-15 ENCOUNTER — Other Ambulatory Visit: Payer: Self-pay

## 2019-03-15 DIAGNOSIS — N183 Chronic kidney disease, stage 3 (moderate): Secondary | ICD-10-CM | POA: Insufficient documentation

## 2019-03-15 DIAGNOSIS — R609 Edema, unspecified: Secondary | ICD-10-CM | POA: Diagnosis not present

## 2019-03-15 DIAGNOSIS — Z79899 Other long term (current) drug therapy: Secondary | ICD-10-CM | POA: Diagnosis not present

## 2019-03-15 DIAGNOSIS — M79604 Pain in right leg: Secondary | ICD-10-CM | POA: Diagnosis not present

## 2019-03-15 DIAGNOSIS — R0602 Shortness of breath: Secondary | ICD-10-CM | POA: Insufficient documentation

## 2019-03-15 DIAGNOSIS — I130 Hypertensive heart and chronic kidney disease with heart failure and stage 1 through stage 4 chronic kidney disease, or unspecified chronic kidney disease: Secondary | ICD-10-CM | POA: Diagnosis not present

## 2019-03-15 DIAGNOSIS — R600 Localized edema: Secondary | ICD-10-CM | POA: Diagnosis not present

## 2019-03-15 DIAGNOSIS — E1122 Type 2 diabetes mellitus with diabetic chronic kidney disease: Secondary | ICD-10-CM | POA: Diagnosis not present

## 2019-03-15 DIAGNOSIS — I5032 Chronic diastolic (congestive) heart failure: Secondary | ICD-10-CM | POA: Diagnosis not present

## 2019-03-15 DIAGNOSIS — R52 Pain, unspecified: Secondary | ICD-10-CM

## 2019-03-15 DIAGNOSIS — M7989 Other specified soft tissue disorders: Secondary | ICD-10-CM

## 2019-03-15 DIAGNOSIS — N390 Urinary tract infection, site not specified: Secondary | ICD-10-CM | POA: Insufficient documentation

## 2019-03-15 LAB — CBC WITH DIFFERENTIAL/PLATELET
Abs Immature Granulocytes: 0.02 10*3/uL (ref 0.00–0.07)
Basophils Absolute: 0.1 10*3/uL (ref 0.0–0.1)
Basophils Relative: 1 %
Eosinophils Absolute: 0.5 10*3/uL (ref 0.0–0.5)
Eosinophils Relative: 5 %
HCT: 31.6 % — ABNORMAL LOW (ref 36.0–46.0)
Hemoglobin: 10.3 g/dL — ABNORMAL LOW (ref 12.0–15.0)
Immature Granulocytes: 0 %
Lymphocytes Relative: 47 %
Lymphs Abs: 4.5 10*3/uL — ABNORMAL HIGH (ref 0.7–4.0)
MCH: 28.9 pg (ref 26.0–34.0)
MCHC: 32.6 g/dL (ref 30.0–36.0)
MCV: 88.5 fL (ref 80.0–100.0)
Monocytes Absolute: 0.6 10*3/uL (ref 0.1–1.0)
Monocytes Relative: 6 %
Neutro Abs: 4 10*3/uL (ref 1.7–7.7)
Neutrophils Relative %: 41 %
Platelets: 390 10*3/uL (ref 150–400)
RBC: 3.57 MIL/uL — ABNORMAL LOW (ref 3.87–5.11)
RDW: 12.7 % (ref 11.5–15.5)
WBC: 9.7 10*3/uL (ref 4.0–10.5)
nRBC: 0 % (ref 0.0–0.2)

## 2019-03-15 LAB — COMPREHENSIVE METABOLIC PANEL
ALT: 26 U/L (ref 0–44)
AST: 39 U/L (ref 15–41)
Albumin: 2.9 g/dL — ABNORMAL LOW (ref 3.5–5.0)
Alkaline Phosphatase: 96 U/L (ref 38–126)
Anion gap: 7 (ref 5–15)
BUN: 27 mg/dL — ABNORMAL HIGH (ref 6–20)
CO2: 24 mmol/L (ref 22–32)
Calcium: 8.7 mg/dL — ABNORMAL LOW (ref 8.9–10.3)
Chloride: 108 mmol/L (ref 98–111)
Creatinine, Ser: 1.78 mg/dL — ABNORMAL HIGH (ref 0.44–1.00)
GFR calc Af Amer: 41 mL/min — ABNORMAL LOW (ref >60)
GFR calc non Af Amer: 35 mL/min — ABNORMAL LOW (ref >60)
Glucose, Bld: 87 mg/dL (ref 70–99)
Potassium: 3.4 mmol/L — ABNORMAL LOW (ref 3.5–5.1)
Sodium: 139 mmol/L (ref 135–145)
Total Bilirubin: 0.7 mg/dL (ref 0.3–1.2)
Total Protein: 6.8 g/dL (ref 6.5–8.1)

## 2019-03-15 LAB — URINALYSIS, ROUTINE W REFLEX MICROSCOPIC
Bilirubin Urine: NEGATIVE
Glucose, UA: NEGATIVE mg/dL
Hgb urine dipstick: NEGATIVE
Ketones, ur: NEGATIVE mg/dL
Leukocytes,Ua: NEGATIVE
Nitrite: NEGATIVE
Protein, ur: >=300 mg/dL — AB
Specific Gravity, Urine: 1.022 (ref 1.005–1.030)
pH: 5 (ref 5.0–8.0)

## 2019-03-15 LAB — PREGNANCY, URINE: Preg Test, Ur: NEGATIVE

## 2019-03-15 LAB — BRAIN NATRIURETIC PEPTIDE: B Natriuretic Peptide: 41 pg/mL (ref 0.0–100.0)

## 2019-03-15 MED ORDER — CEPHALEXIN 500 MG PO CAPS: 500.0000 mg | ORAL_CAPSULE | Freq: Two times a day (BID) | ORAL | 0 refills | Status: AC

## 2019-03-15 MED ORDER — MORPHINE SULFATE (PF) 4 MG/ML IV SOLN: 4.0000 mg | Freq: Once | INTRAVENOUS | Status: DC

## 2019-03-15 MED ORDER — CYCLOBENZAPRINE HCL 10 MG PO TABS: 10.0000 mg | ORAL_TABLET | Freq: Two times a day (BID) | ORAL | 0 refills | Status: DC | PRN

## 2019-03-15 MED ORDER — OXYCODONE-ACETAMINOPHEN 5-325 MG PO TABS
1.0000 | ORAL_TABLET | Freq: Once | ORAL | Status: AC
  Administered 2019-03-15: 20:00:00 1 via ORAL
  Filled 2019-03-15: qty 1

## 2019-03-15 NOTE — ED Triage Notes (Addendum)
Pt to ED with c/o left lower back pain and unable to urinate.  Also st's she has swelling in right leg x's 1 week.  Pt st's she has hx of CHF   Pt is currently taking lasix  Pt c/o shortness of breath

## 2019-03-15 NOTE — ED Notes (Signed)
Patient Alert and oriented to baseline. Stable and ambulatory to baseline. Patient verbalized understanding of the discharge instructions.  Patient belongings were taken by the patient.   

## 2019-03-15 NOTE — ED Provider Notes (Signed)
[facility] EMERGENCY DEPARTMENT Provider Note   CSN: [id number] Arrival date & time: 03/15/19  1520     History   Chief Complaint Chief Complaint  Patient presents with  . Urinary Tract Infection    HPI Jacqueline [name] is a 38 y.o. female with a past medical history of obesity, diabetes, hypertension, CKD, who presents to ED for multiple complaints.  Reports lower back pain and urinary hesitancy for the past 4 to 5 days.  Pain in her back is aching, only mildly improved with Tylenol.  Also reports swelling in bilateral legs, right greater than left.  She is concerned that she may have a buildup of fluid in her lungs again despite taking her Lasix.  Reports some shortness of breath as well.  Denies any cough, chest pain, history of DVT, PE or MI, hemoptysis, fever, vaginal complaints, changes to bowel movements.  Reports nausea but denies any vomiting.     HPI  Past Medical History:  Diagnosis Date  . Anemia   . Anxiety   . Chronic bronchitis ([place])   . Hypertension   . Increased frequency of headaches   . Morbid obesity ([place])   . Sleep apnea   . Type II diabetes mellitus [facility])     Patient Active Problem List   Diagnosis Date Noted  . AKI (acute kidney injury) ([place])   . CKD (chronic kidney disease), stage III ([place])   . Obesity, Class III, BMI 40-49.9 (morbid obesity) ([place])   . Insulin dependent diabetes mellitus ([place])   . Anemia of chronic disease   . HLD (hyperlipidemia) 07/02/2018  . Acute on chronic diastolic CHF (congestive heart failure) ([place]) 07/02/2018  . Depression 07/02/2018  . OSA (obstructive sleep apnea) 07/02/2018  . Acute on chronic diastolic (congestive) heart failure ([place]) 07/02/2018  . Cellulitis of right lower extremity 07/02/2018  . Anemia 07/02/2018  . Type 2 diabetes mellitus with hyperglycemia, with long-term current use of insulin ([place]) 07/02/2018  . Chest pain 05/04/2018  . Hypertensive urgency 05/03/2018  . Left facial numbness  05/03/2018  . Headache 05/03/2018  . Elevated troponin 05/03/2018  . PAF (paroxysmal atrial fibrillation) ([place]) 05/25/2017  . Sepsis ([place]) 05/03/2017    Class: Present on Admission  . Acute renal failure superimposed on stage 3 chronic kidney disease ([place]) 05/03/2017    Class: Stage 3  . Neck pain   . Acidosis, metabolic   . Hyponatremia   . Type II diabetes mellitus with renal manifestations ([place]) 04/30/2017  . Normocytic anemia 04/30/2017  . Staphylococcus aureus bacteremia 03/15/2016  . Streptococcal bacteremia 03/15/2016  . Tachycardia 03/14/2016  . Fever 03/14/2016  . Bad headache 03/14/2016  . Morbid obesity ([place])   . Diabetes ([place]) 01/01/2014    Past Surgical History:  Procedure Laterality Date  . BARIATRIC SURGERY    . BREAST REDUCTION SURGERY  03/21/2017  . CARDIAC CATHETERIZATION  01/06/2016  . CARDIAC CATHETERIZATION N/A 01/06/2016   Procedure: Left Heart Cath and Coronary Angiography;  Surgeon: Wellington Hampshire, MD;  Location: [facility];  Service: Cardiovascular;  Laterality: N/A;  . CESAREAN SECTION  08/2014     OB History    Gravida  1   Para      Term      Preterm      AB      Living        SAB      TAB      Ectopic  Multiple      Live Births               Home Medications    Prior to Admission medications   Medication Sig Start Date End Date Taking? Authorizing Provider  acetaminophen (TYLENOL) 500 MG tablet Take 1,000 mg by mouth every 6 (six) hours as needed for moderate pain.   Yes [provider]  amLODipine (NORVASC) 2.5 MG tablet Take 2.5 mg by mouth daily.   Yes [provider]  atorvastatin (LIPITOR) 10 MG tablet Take 10 mg by mouth every evening. 03/08/18  Yes [provider]  carvedilol (COREG) 25 MG tablet Take 1 tablet (25 mg total) by mouth 2 (two) times daily with a meal. 05/06/18  Yes Lady Deutscher, MD  diphenhydrAMINE (BENADRYL) 25 MG tablet Take 25 mg by mouth every 6 (six)  hours as needed for allergies.   Yes [provider]  DULoxetine (CYMBALTA) 60 MG capsule Take 60 mg by mouth at bedtime. 03/08/18  Yes [provider]  furosemide (LASIX) 20 MG tablet Take 20 mg by mouth daily.   Yes [provider]  Vitamin D, Ergocalciferol, (DRISDOL) 1.25 MG (50000 UT) CAPS capsule Take 50,000 Units by mouth once a week. Thursday 09/03/18  Yes [provider]  cephALEXin (KEFLEX) 500 MG capsule Take 1 capsule (500 mg total) by mouth 2 (two) times daily for 5 days. 03/15/19 03/20/19  Seynabou Fults, PA-C  cyclobenzaprine (FLEXERIL) 10 MG tablet Take 1 tablet (10 mg total) by mouth 2 (two) times daily as needed for muscle spasms. 03/15/19   Delia Heady, PA-C    Family History Family History  Problem Relation Age of Onset  . Diabetes Mother   . Hypertension Mother   . Thyroid disease Mother   . Kidney disease Maternal Grandmother   . Diabetes Maternal Grandmother   . Heart attack Other     Social History Social History   Tobacco Use  . Smoking status: Never Smoker  . Smokeless tobacco: Never Used  Substance Use Topics  . Alcohol use: No  . Drug use: No     Allergies   Patient has no known allergies.   Review of Systems Review of Systems  Constitutional: Negative for appetite change, chills and fever.  HENT: Negative for ear pain, rhinorrhea, sneezing and sore throat.   Eyes: Negative for photophobia and visual disturbance.  Respiratory: Positive for shortness of breath. Negative for cough, chest tightness and wheezing.   Cardiovascular: Positive for leg swelling. Negative for chest pain and palpitations.  Gastrointestinal: Negative for abdominal pain, blood in stool, constipation, diarrhea, nausea and vomiting.  Genitourinary: Positive for difficulty urinating and urgency. Negative for dysuria and hematuria.  Musculoskeletal: Positive for back pain. Negative for myalgias.  Skin: Negative for rash.  Neurological: Negative for  dizziness, weakness and light-headedness.     Physical Exam Updated Vital Signs BP (!) 186/100   Pulse 81   Temp 98.9 F (37.2 C) (Oral)   Resp 19   Ht 5\' 5"  (1.651 m)   Wt 108.9 kg   LMP 03/08/2019 (Approximate)   SpO2 99%   BMI 39.94 kg/m   Physical Exam Vitals signs and nursing note reviewed.  Constitutional:      General: She is not in acute distress.    Appearance: She is well-developed. She is obese.     Comments: Speaking in complete sentences without difficulty.  HENT:     Head: Normocephalic and atraumatic.  Nose: Nose normal.  Eyes:     General: No scleral icterus.       Left eye: No discharge.     Conjunctiva/sclera: Conjunctivae normal.  Neck:     Musculoskeletal: Normal range of motion and neck supple.  Cardiovascular:     Rate and Rhythm: Normal rate and regular rhythm.     Heart sounds: Normal heart sounds. No murmur. No friction rub. No gallop.   Pulmonary:     Effort: Pulmonary effort is normal. No respiratory distress.     Breath sounds: Normal breath sounds.  Abdominal:     General: Bowel sounds are normal. There is no distension.     Palpations: Abdomen is soft.     Tenderness: There is no abdominal tenderness. There is no guarding.  Musculoskeletal: Normal range of motion.       Back:     Right lower leg: Edema present.     Left lower leg: Edema present.     Comments: 2+ pitting edema in right lower extremity, 1+ pitting edema in left lower extremity.  No calf tenderness bilaterally.  No erythema or warmth of extremities noted.  Tenderness palpation at the indicated area of the lumbar spine at the midline paraspinal musculature.  No step-off palpated.  No saddle anesthesia noted.  Ambulatory without difficulty.  Skin:    General: Skin is warm and dry.     Findings: No rash.  Neurological:     Mental Status: She is alert.     Motor: No abnormal muscle tone.     Coordination: Coordination normal.      ED Treatments / Results  Labs  (all labs ordered are listed, but only abnormal results are displayed) Labs Reviewed  CBC WITH DIFFERENTIAL/PLATELET - Abnormal; Notable for the following components:      Result Value   RBC 3.57 (*)    Hemoglobin 10.3 (*)    HCT 31.6 (*)    Lymphs Abs 4.5 (*)    All other components within normal limits  COMPREHENSIVE METABOLIC PANEL - Abnormal; Notable for the following components:   Potassium 3.4 (*)    BUN 27 (*)    Creatinine, Ser 1.78 (*)    Calcium 8.7 (*)    Albumin 2.9 (*)    GFR calc non Af Amer 35 (*)    GFR calc Af Amer 41 (*)    All other components within normal limits  URINALYSIS, ROUTINE W REFLEX MICROSCOPIC - Abnormal; Notable for the following components:   APPearance HAZY (*)    Protein, ur >=300 (*)    Bacteria, UA MANY (*)    All other components within normal limits  BRAIN NATRIURETIC PEPTIDE  PREGNANCY, URINE    EKG EKG Interpretation  Date/Time:  Friday March 15 2019 15:48:57 EDT Ventricular Rate:  96 PR Interval:  146 QRS Duration: 80 QT Interval:  370 QTC Calculation: 467 R Axis:   121 Text Interpretation:  Normal sinus rhythm Right axis deviation Abnormal ECG no significant change since Feb 2020 Confirmed by Sherwood Gambler [phone]) on 03/15/2019 6:14:40 PM   Radiology Dg Chest 2 View  Result Date: 03/15/2019 CLINICAL DATA:  Shortness of breath and pain EXAM: CHEST - 2 VIEW COMPARISON:  September 12, 2018 FINDINGS: Lungs are clear. Heart size and pulmonary vascularity are normal. No adenopathy. There is degenerative change in the thoracic spine. No pneumothorax. IMPRESSION: No edema or consolidation. Electronically Signed   By: Lowella Grip III M.D.   On: 03/15/2019  16:41   Vas [place] Lower Extremity Venous (dvt) (only Mc & Wl 7a-7p)  Result Date: 03/15/2019  Lower Venous Study Indications: Pain, and Swelling.  Limitations: Body habitus. Comparison Study: 09/12/18 negative Performing Technologist: Toma Copier RVS  Examination Guidelines:  A complete evaluation includes B-mode imaging, spectral Doppler, color Doppler, and power Doppler as needed of all accessible portions of each vessel. Bilateral testing is considered an integral part of a complete examination. Limited examinations for reoccurring indications may be performed as noted.  +---------+---------------+---------+-----------+----------+-------------------+ RIGHT    CompressibilityPhasicitySpontaneityPropertiesThrombus Aging      +---------+---------------+---------+-----------+----------+-------------------+ CFV      Full           Yes      Yes                                      +---------+---------------+---------+-----------+----------+-------------------+ SFJ      Full                                                             +---------+---------------+---------+-----------+----------+-------------------+ FV Prox  Full           Yes      Yes                                      +---------+---------------+---------+-----------+----------+-------------------+ FV Mid   Full                                                             +---------+---------------+---------+-----------+----------+-------------------+ FV DistalFull           Yes      Yes                                      +---------+---------------+---------+-----------+----------+-------------------+ PFV      Full           Yes      Yes                                      +---------+---------------+---------+-----------+----------+-------------------+ POP      Full           Yes      Yes                                      +---------+---------------+---------+-----------+----------+-------------------+ PTV      Full                                         Difficult to image  due to body habitus +---------+---------------+---------+-----------+----------+-------------------+ PERO     Full                                          Difficult to image                                                        due to body habitus +---------+---------------+---------+-----------+----------+-------------------+   +----+---------------+---------+-----------+----------+--------------+ LEFTCompressibilityPhasicitySpontaneityPropertiesThrombus Aging +----+---------------+---------+-----------+----------+--------------+ CFV Full           Yes      Yes                                 +----+---------------+---------+-----------+----------+--------------+ SFJ Full                                                        +----+---------------+---------+-----------+----------+--------------+     Summary: Right: There is no evidence of deep vein thrombosis in the lower extremity. No cystic structure found in the popliteal fossa. No change from previous study. Left: There is no evidence of a common femoral vein obstruction  *See table(s) above for measurements and observations.    Preliminary     Procedures Procedures (including critical care time)  Medications Ordered in ED Medications  oxyCODONE-acetaminophen (PERCOCET/ROXICET) 5-325 MG per tablet 1 tablet (1 tablet Oral Given 03/15/19 2004)     Initial Impression / Assessment and Plan / ED Course  I have reviewed the triage vital signs and the nursing notes.  Pertinent labs & imaging results that were available during my care of the patient were reviewed by me and considered in my medical decision making (see chart for details).       38 year old female with past medical history of obesity, diabetes, hypertension, CKD presents to ED for multiple complaints.  Reports lower back pain, urinary hesitancy, swelling in bilateral legs, right greater than left.  She is concerned that she may have a blowup of fluid in her lungs and legs despite taking her Lasix.  Denies any chest pain, hemoptysis, history of DVT, PE or MI.  On my exam there  is pitting edema to bilateral lower extremities, right greater than left.  Lungs are clear to auscultation bilaterally.  She is not tachycardic, tachypneic or hypoxic.  She is hypertensive here and has history of same on prior visits.  EKG shows sinus rhythm, no changes from prior tracings.  BNP within normal limits.  Chest x-ray is unremarkable.  CMP showing creatinine of 1.7 which is slightly lower than her usual baseline.  CBC unremarkable.  Pregnancy test negative for urinalysis with many bacteria.  Will treat for this as she is symptomatic.  She is pending right lower extremity ultrasound to rule out DVT, as his extremity is more swollen compared to the left.  Of note, she did have bilateral DVT studies done about 6 months ago which were both negative. However she states this time the swelling is worse.  8:39 PM Ultrasound results as follows: Summary: Right:  There is no evidence of deep vein thrombosis in the lower extremity. No cystic structure found in the popliteal fossa. No change from previous study. Left: There is no evidence of a common femoral vein obstruction  Patient with improvement in her symptoms with medications given here.  Will treat for UTI, give instructions regarding peripheral edema as this does not appear to be fluid overload due to her lab work and chest x-ray.  Encouraged her to continue diuretics and follow-up with PCP.  Advised to return for worsening symptoms.  Patient is hemodynamically stable, in NAD, and able to ambulate in the ED. Evaluation does not show pathology that would require ongoing emergent intervention or inpatient treatment. I explained the diagnosis to the patient. Pain has been managed and has no complaints prior to discharge. Patient is comfortable with above plan and is stable for discharge at this time. All questions were answered prior to disposition. Strict return precautions for returning to the ED were discussed. Encouraged follow up with PCP.   An  After Visit Summary was printed and given to the patient.   Portions of this note were generated with [employer]. Dictation errors may occur despite best attempts at proofreading.   Final Clinical Impressions(s) / ED Diagnoses   Final diagnoses:  Lower urinary tract infectious disease  Peripheral edema    ED Discharge Orders         Ordered    cephALEXin (KEFLEX) 500 MG capsule  2 times daily     03/15/19 2008    cyclobenzaprine (FLEXERIL) 10 MG tablet  2 times daily PRN     03/15/19 2042           Delia Heady, PA-C 03/15/19 2043    Sherwood Gambler, MD 03/15/19 2054

## 2019-03-15 NOTE — Discharge Instructions (Signed)
Take the antibiotics as directed to help with your urinary tract infection. Continue your home medications including Lasix as prescribed. Return to ED if you start to have worsening symptoms, develop a fever, chest pain, coughing up blood or worsening swelling.

## 2019-03-15 NOTE — Progress Notes (Signed)
Right lower extremity venous duplex completed. Preliminary results in Chart review CV Proc. [place] Assunta Pupo,RVS 03/15/2019, 8:32 pm

## 2019-03-15 NOTE — ED Notes (Signed)
Pt ambulated to room from waiting room, currently in restroom to collect urine sample

## 2019-04-02 DIAGNOSIS — G4733 Obstructive sleep apnea (adult) (pediatric): Secondary | ICD-10-CM | POA: Diagnosis not present

## 2019-04-15 DIAGNOSIS — F321 Major depressive disorder, single episode, moderate: Secondary | ICD-10-CM | POA: Diagnosis not present

## 2019-04-15 DIAGNOSIS — R600 Localized edema: Secondary | ICD-10-CM | POA: Diagnosis not present

## 2019-04-15 DIAGNOSIS — I739 Peripheral vascular disease, unspecified: Secondary | ICD-10-CM | POA: Diagnosis not present

## 2019-04-15 DIAGNOSIS — G4733 Obstructive sleep apnea (adult) (pediatric): Secondary | ICD-10-CM | POA: Diagnosis not present

## 2019-04-15 DIAGNOSIS — F411 Generalized anxiety disorder: Secondary | ICD-10-CM | POA: Diagnosis not present

## 2019-04-15 DIAGNOSIS — E1165 Type 2 diabetes mellitus with hyperglycemia: Secondary | ICD-10-CM | POA: Diagnosis not present

## 2019-04-15 DIAGNOSIS — Z23 Encounter for immunization: Secondary | ICD-10-CM | POA: Diagnosis not present

## 2019-04-25 DIAGNOSIS — F411 Generalized anxiety disorder: Secondary | ICD-10-CM | POA: Diagnosis not present

## 2019-04-25 DIAGNOSIS — E1165 Type 2 diabetes mellitus with hyperglycemia: Secondary | ICD-10-CM | POA: Diagnosis not present

## 2019-04-30 DIAGNOSIS — F410 Panic disorder [episodic paroxysmal anxiety] without agoraphobia: Secondary | ICD-10-CM | POA: Diagnosis not present

## 2019-04-30 DIAGNOSIS — F411 Generalized anxiety disorder: Secondary | ICD-10-CM | POA: Diagnosis not present

## 2019-04-30 DIAGNOSIS — R600 Localized edema: Secondary | ICD-10-CM | POA: Diagnosis not present

## 2019-04-30 DIAGNOSIS — I10 Essential (primary) hypertension: Secondary | ICD-10-CM | POA: Diagnosis not present

## 2019-05-02 DIAGNOSIS — G4733 Obstructive sleep apnea (adult) (pediatric): Secondary | ICD-10-CM | POA: Diagnosis not present

## 2019-06-02 DIAGNOSIS — G4733 Obstructive sleep apnea (adult) (pediatric): Secondary | ICD-10-CM | POA: Diagnosis not present

## 2019-06-24 ENCOUNTER — Emergency Department (HOSPITAL_COMMUNITY): Payer: BC Managed Care – PPO

## 2019-06-24 ENCOUNTER — Emergency Department (HOSPITAL_COMMUNITY)
Admission: EM | Admit: 2019-06-24 | Discharge: 2019-06-24 | Disposition: A | Discharge status: Home / Self Care | Payer: BC Managed Care – PPO | Attending: Emergency Medicine | Admitting: Emergency Medicine

## 2019-06-24 ENCOUNTER — Other Ambulatory Visit: Payer: Self-pay

## 2019-06-24 ENCOUNTER — Encounter (HOSPITAL_COMMUNITY): Payer: Self-pay | Admitting: Emergency Medicine

## 2019-06-24 DIAGNOSIS — N183 Chronic kidney disease, stage 3 unspecified: Secondary | ICD-10-CM | POA: Diagnosis not present

## 2019-06-24 DIAGNOSIS — Z79899 Other long term (current) drug therapy: Secondary | ICD-10-CM | POA: Insufficient documentation

## 2019-06-24 DIAGNOSIS — I5032 Chronic diastolic (congestive) heart failure: Secondary | ICD-10-CM | POA: Diagnosis not present

## 2019-06-24 DIAGNOSIS — I130 Hypertensive heart and chronic kidney disease with heart failure and stage 1 through stage 4 chronic kidney disease, or unspecified chronic kidney disease: Secondary | ICD-10-CM | POA: Diagnosis not present

## 2019-06-24 DIAGNOSIS — R42 Dizziness and giddiness: Secondary | ICD-10-CM | POA: Diagnosis not present

## 2019-06-24 DIAGNOSIS — R05 Cough: Secondary | ICD-10-CM | POA: Diagnosis not present

## 2019-06-24 DIAGNOSIS — Z20828 Contact with and (suspected) exposure to other viral communicable diseases: Secondary | ICD-10-CM | POA: Diagnosis not present

## 2019-06-24 DIAGNOSIS — R531 Weakness: Secondary | ICD-10-CM | POA: Insufficient documentation

## 2019-06-24 DIAGNOSIS — R519 Headache, unspecified: Secondary | ICD-10-CM | POA: Diagnosis not present

## 2019-06-24 DIAGNOSIS — R197 Diarrhea, unspecified: Secondary | ICD-10-CM | POA: Diagnosis not present

## 2019-06-24 DIAGNOSIS — N3001 Acute cystitis with hematuria: Secondary | ICD-10-CM | POA: Diagnosis not present

## 2019-06-24 DIAGNOSIS — R112 Nausea with vomiting, unspecified: Secondary | ICD-10-CM

## 2019-06-24 DIAGNOSIS — E1122 Type 2 diabetes mellitus with diabetic chronic kidney disease: Secondary | ICD-10-CM | POA: Insufficient documentation

## 2019-06-24 DIAGNOSIS — Z794 Long term (current) use of insulin: Secondary | ICD-10-CM | POA: Insufficient documentation

## 2019-06-24 LAB — COMPREHENSIVE METABOLIC PANEL
ALT: 27 U/L (ref 0–44)
AST: 28 U/L (ref 15–41)
Albumin: 2.3 g/dL — ABNORMAL LOW (ref 3.5–5.0)
Alkaline Phosphatase: 114 U/L (ref 38–126)
Anion gap: 7 (ref 5–15)
BUN: 18 mg/dL (ref 6–20)
CO2: 23 mmol/L (ref 22–32)
Calcium: 8.5 mg/dL — ABNORMAL LOW (ref 8.9–10.3)
Chloride: 113 mmol/L — ABNORMAL HIGH (ref 98–111)
Creatinine, Ser: 1.89 mg/dL — ABNORMAL HIGH (ref 0.44–1.00)
GFR calc Af Amer: 38 mL/min — ABNORMAL LOW (ref >60)
GFR calc non Af Amer: 33 mL/min — ABNORMAL LOW (ref >60)
Glucose, Bld: 130 mg/dL — ABNORMAL HIGH (ref 70–99)
Potassium: 3.9 mmol/L (ref 3.5–5.1)
Sodium: 143 mmol/L (ref 135–145)
Total Bilirubin: 0.5 mg/dL (ref 0.3–1.2)
Total Protein: 6.3 g/dL — ABNORMAL LOW (ref 6.5–8.1)

## 2019-06-24 LAB — URINALYSIS, ROUTINE W REFLEX MICROSCOPIC
Bilirubin Urine: NEGATIVE
Glucose, UA: 50 mg/dL — AB
Ketones, ur: NEGATIVE mg/dL
Nitrite: NEGATIVE
Protein, ur: 100 mg/dL — AB
Specific Gravity, Urine: 1.015 (ref 1.005–1.030)
pH: 6 (ref 5.0–8.0)

## 2019-06-24 LAB — RESPIRATORY PANEL BY RT PCR (FLU A&B, COVID)
Influenza A by PCR: NEGATIVE
Influenza B by PCR: NEGATIVE
SARS Coronavirus 2 by RT PCR: NEGATIVE

## 2019-06-24 LAB — CBC
HCT: 30.7 % — ABNORMAL LOW (ref 36.0–46.0)
Hemoglobin: 10.1 g/dL — ABNORMAL LOW (ref 12.0–15.0)
MCH: 29.5 pg (ref 26.0–34.0)
MCHC: 32.9 g/dL (ref 30.0–36.0)
MCV: 89.8 fL (ref 80.0–100.0)
Platelets: 388 10*3/uL (ref 150–400)
RBC: 3.42 MIL/uL — ABNORMAL LOW (ref 3.87–5.11)
RDW: 13 % (ref 11.5–15.5)
WBC: 8.4 10*3/uL (ref 4.0–10.5)
nRBC: 0 % (ref 0.0–0.2)

## 2019-06-24 LAB — I-STAT BETA HCG BLOOD, ED (MC, WL, AP ONLY): I-stat hCG, quantitative: <5 m[IU]/mL (ref <5)

## 2019-06-24 LAB — LIPASE, BLOOD: Lipase: 32 U/L (ref 11–51)

## 2019-06-24 LAB — POC SARS CORONAVIRUS 2 AG -  ED: SARS Coronavirus 2 Ag: NEGATIVE

## 2019-06-24 MED ORDER — CEPHALEXIN 500 MG PO CAPS: 500.0000 mg | ORAL_CAPSULE | Freq: Two times a day (BID) | ORAL | 0 refills | Status: DC

## 2019-06-24 MED ORDER — SODIUM CHLORIDE 0.9 % IV BOLUS
500.0000 mL | Freq: Once | INTRAVENOUS | Status: AC
  Administered 2019-06-24: 500 mL via INTRAVENOUS

## 2019-06-24 MED ORDER — SODIUM CHLORIDE 0.9% FLUSH
3.0000 mL | Freq: Once | INTRAVENOUS | Status: AC
  Administered 2019-06-24: 3 mL via INTRAVENOUS

## 2019-06-24 MED ORDER — CARVEDILOL 12.5 MG PO TABS: 25.0000 mg | ORAL_TABLET | Freq: Two times a day (BID) | ORAL | Status: DC

## 2019-06-24 MED ORDER — AMLODIPINE BESYLATE 5 MG PO TABS
5.0000 mg | ORAL_TABLET | Freq: Once | ORAL | Status: AC
  Administered 2019-06-24: 5 mg via ORAL
  Filled 2019-06-24: qty 1

## 2019-06-24 MED ORDER — MAGNESIUM SULFATE IN D5W 1-5 GM/100ML-% IV SOLN
1.0000 g | Freq: Once | INTRAVENOUS | Status: AC
  Administered 2019-06-24: 1 g via INTRAVENOUS
  Filled 2019-06-24: qty 100

## 2019-06-24 MED ORDER — METOCLOPRAMIDE HCL 5 MG/ML IJ SOLN
10.0000 mg | Freq: Once | INTRAMUSCULAR | Status: AC
  Administered 2019-06-24: 10 mg via INTRAVENOUS
  Filled 2019-06-24: qty 2

## 2019-06-24 MED ORDER — METOCLOPRAMIDE HCL 10 MG PO TABS: 10.0000 mg | ORAL_TABLET | Freq: Four times a day (QID) | ORAL | 0 refills | Status: DC

## 2019-06-24 MED ORDER — ACETAMINOPHEN 325 MG PO TABS
650.0000 mg | ORAL_TABLET | Freq: Once | ORAL | Status: AC
  Administered 2019-06-24: 650 mg via ORAL
  Filled 2019-06-24: qty 2

## 2019-06-24 MED ORDER — CEPHALEXIN 250 MG PO CAPS
500.0000 mg | ORAL_CAPSULE | Freq: Once | ORAL | Status: AC
  Administered 2019-06-24: 500 mg via ORAL
  Filled 2019-06-24: qty 2

## 2019-06-24 MED ORDER — LORAZEPAM 2 MG/ML IJ SOLN
1.0000 mg | Freq: Once | INTRAMUSCULAR | Status: AC
  Administered 2019-06-24: 1 mg via INTRAVENOUS
  Filled 2019-06-24: qty 1

## 2019-06-24 NOTE — ED Notes (Signed)
BP over [id number] systolic X2. Made Alex PA aware.

## 2019-06-24 NOTE — ED Provider Notes (Signed)
[facility] EMERGENCY DEPARTMENT Provider Note   CSN: [id number] Arrival date & time: 06/24/19  0800     History   Chief Complaint Chief Complaint  Patient presents with  . Headache  . Diarrhea  . Emesis    HPI Jacqueline [name] is a 38 y.o. female with history of hypertension, diabetes, obesity, headaches, CHF, CKD stage III who presents with a 3 to 4-day history of nausea, vomiting, diarrhea, nighttime dry cough, headache, chills.  Patient denies any documented fever at home.  She has been lightheaded with standing.  She denies any chest pain, shortness of breath, significant abdominal pain.  Reports some cramping prior to having diarrhea.  Patient has had some right-sided low back pain.  Headache came on gradually.  She has had some soreness in her neck, but no difficulty moving it.  Patient has had some weakness and decreased sensation to her right upper and lower extremity.  Patient reports this happened last year in association with a headache and she was on code "stroke alert." Per chart review, patient was admitted and had an MRI which was negative.     HPI  Past Medical History:  Diagnosis Date  . Anemia   . Anxiety   . Chronic bronchitis ([place])   . Hypertension   . Increased frequency of headaches   . Morbid obesity ([place])   . Sleep apnea   . Type II diabetes mellitus [facility])     Patient Active Problem List   Diagnosis Date Noted  . AKI (acute kidney injury) ([place])   . CKD (chronic kidney disease), stage III   . Obesity, Class III, BMI 40-49.9 (morbid obesity) ([place])   . Insulin dependent diabetes mellitus   . Anemia of chronic disease   . HLD (hyperlipidemia) 07/02/2018  . Acute on chronic diastolic CHF (congestive heart failure) ([place]) 07/02/2018  . Depression 07/02/2018  . OSA (obstructive sleep apnea) 07/02/2018  . Acute on chronic diastolic (congestive) heart failure ([place]) 07/02/2018  . Cellulitis of right lower extremity 07/02/2018  . Anemia  07/02/2018  . Type 2 diabetes mellitus with hyperglycemia, with long-term current use of insulin ([place]) 07/02/2018  . Chest pain 05/04/2018  . Hypertensive urgency 05/03/2018  . Left facial numbness 05/03/2018  . Headache 05/03/2018  . Elevated troponin 05/03/2018  . PAF (paroxysmal atrial fibrillation) ([place]) 05/25/2017  . Sepsis ([place]) 05/03/2017    Class: Present on Admission  . Acute renal failure superimposed on stage 3 chronic kidney disease ([place]) 05/03/2017    Class: Stage 3  . Neck pain   . Acidosis, metabolic   . Hyponatremia   . Type II diabetes mellitus with renal manifestations ([place]) 04/30/2017  . Normocytic anemia 04/30/2017  . Staphylococcus aureus bacteremia 03/15/2016  . Streptococcal bacteremia 03/15/2016  . Tachycardia 03/14/2016  . Fever 03/14/2016  . Bad headache 03/14/2016  . Morbid obesity ([place])   . Diabetes ([place]) 01/01/2014    Past Surgical History:  Procedure Laterality Date  . BARIATRIC SURGERY    . BREAST REDUCTION SURGERY  03/21/2017  . CARDIAC CATHETERIZATION  01/06/2016  . CARDIAC CATHETERIZATION N/A 01/06/2016   Procedure: Left Heart Cath and Coronary Angiography;  Surgeon: Wellington Hampshire, MD;  Location: [facility];  Service: Cardiovascular;  Laterality: N/A;  . CESAREAN SECTION  08/2014     OB History    Gravida  1   Para      Term      Preterm  AB      Living        SAB      TAB      Ectopic      Multiple      Live Births               Home Medications    Prior to Admission medications   Medication Sig Start Date End Date Taking? Authorizing Provider  acetaminophen (TYLENOL) 500 MG tablet Take 1,000 mg by mouth every 6 (six) hours as needed for moderate pain.    [provider]  amLODipine (NORVASC) 2.5 MG tablet Take 2.5 mg by mouth daily.    [provider]  atorvastatin (LIPITOR) 10 MG tablet Take 10 mg by mouth every evening. 03/08/18   [provider]  carvedilol (COREG) 25  MG tablet Take 1 tablet (25 mg total) by mouth 2 (two) times daily with a meal. 05/06/18   Lady Deutscher, MD  cephALEXin (KEFLEX) 500 MG capsule Take 1 capsule (500 mg total) by mouth 2 (two) times daily. 06/24/19   Lashaundra Lehrmann, Bea Graff, PA-C  cyclobenzaprine (FLEXERIL) 10 MG tablet Take 1 tablet (10 mg total) by mouth 2 (two) times daily as needed for muscle spasms. 03/15/19   Khatri, Hina, PA-C  diphenhydrAMINE (BENADRYL) 25 MG tablet Take 25 mg by mouth every 6 (six) hours as needed for allergies.    [provider]  DULoxetine (CYMBALTA) 60 MG capsule Take 60 mg by mouth at bedtime. 03/08/18   [provider]  furosemide (LASIX) 20 MG tablet Take 20 mg by mouth daily.    [provider]  metoCLOPramide (REGLAN) 10 MG tablet Take 1 tablet (10 mg total) by mouth every 6 (six) hours. 06/24/19   Rusell Meneely, Bea Graff, PA-C  Vitamin D, Ergocalciferol, (DRISDOL) 1.25 MG (50000 UT) CAPS capsule Take 50,000 Units by mouth once a week. Thursday 09/03/18   [provider]    Family History Family History  Problem Relation Age of Onset  . Diabetes Mother   . Hypertension Mother   . Thyroid disease Mother   . Kidney disease Maternal Grandmother   . Diabetes Maternal Grandmother   . Heart attack Other     Social History Social History   Tobacco Use  . Smoking status: Never Smoker  . Smokeless tobacco: Never Used  Substance Use Topics  . Alcohol use: No  . Drug use: No     Allergies   Patient has no known allergies.   Review of Systems Review of Systems  Constitutional: Positive for chills. Negative for fever.  HENT: Negative for facial swelling and sore throat.   Respiratory: Positive for cough. Negative for shortness of breath.   Cardiovascular: Negative for chest pain.  Gastrointestinal: Positive for diarrhea, nausea and vomiting. Negative for abdominal pain.  Genitourinary: Negative for dysuria.  Musculoskeletal: Negative for back pain.  Skin:  Negative for rash and wound.  Neurological: Positive for weakness, light-headedness, numbness and headaches.  Psychiatric/Behavioral: The patient is not nervous/anxious.      Physical Exam Updated Vital Signs BP (!) 174/104 (BP Location: Right Arm)   Pulse 100   Temp 98.6 F (37 C) (Oral)   Resp 16   Ht 5\' 4"  (1.626 m)   Wt 104.8 kg   SpO2 100%   BMI 39.65 kg/m   Physical Exam Vitals signs and nursing note reviewed.  Constitutional:      General: She is not in acute distress.  Appearance: She is well-developed. She is not diaphoretic.  HENT:     Head: Normocephalic and atraumatic.     Mouth/Throat:     Pharynx: No oropharyngeal exudate.  Eyes:     General: No scleral icterus.       Right eye: No discharge.        Left eye: No discharge.     Extraocular Movements: Extraocular movements intact.     Conjunctiva/sclera: Conjunctivae normal.     Pupils: Pupils are equal, round, and reactive to light.  Neck:     Musculoskeletal: Full passive range of motion without pain, normal range of motion and neck supple. No neck rigidity.     Thyroid: No thyromegaly.     Comments: Chin to chest without difficulty Cardiovascular:     Rate and Rhythm: Normal rate and regular rhythm.     Heart sounds: Normal heart sounds. No murmur. No friction rub. No gallop.   Pulmonary:     Effort: Pulmonary effort is normal. No respiratory distress.     Breath sounds: Normal breath sounds. No stridor. No wheezing or rales.  Abdominal:     General: Bowel sounds are normal. There is no distension.     Palpations: Abdomen is soft.     Tenderness: There is abdominal tenderness in the epigastric area. There is no guarding or rebound.  Musculoskeletal:     Comments: Mild edema noted to the right lower extremity; some pain over the Achilles; patient states that she sometimes tends to swell more in the right lower extremity related to her CHF  Lymphadenopathy:     Cervical: No cervical adenopathy.   Skin:    General: Skin is warm and dry.     Coloration: Skin is not pale.     Findings: No rash.  Neurological:     Mental Status: She is alert.     GCS: GCS eye subscore is 4. GCS verbal subscore is 5. GCS motor subscore is 6.     Coordination: Coordination normal.     Comments: CN 3-12 intact; normal sensation throughout, except for decreased sensation to the right lower extremity below the knee; 5/5 strength left side upper extremity and lower extremity; 3/5 strength to right upper and right lower extremity; grip strength weaker on the right side       ED Treatments / Results  Labs (all labs ordered are listed, but only abnormal results are displayed) Labs Reviewed  COMPREHENSIVE METABOLIC PANEL - Abnormal; Notable for the following components:      Result Value   Chloride 113 (*)    Glucose, Bld 130 (*)    Creatinine, Ser 1.89 (*)    Calcium 8.5 (*)    Total Protein 6.3 (*)    Albumin 2.3 (*)    GFR calc non Af Amer 33 (*)    GFR calc Af Amer 38 (*)    All other components within normal limits  CBC - Abnormal; Notable for the following components:   RBC 3.42 (*)    Hemoglobin 10.1 (*)    HCT 30.7 (*)    All other components within normal limits  URINALYSIS, ROUTINE W REFLEX MICROSCOPIC - Abnormal; Notable for the following components:   APPearance CLOUDY (*)    Glucose, UA 50 (*)    Hgb urine dipstick MODERATE (*)    Protein, ur 100 (*)    Leukocytes,Ua TRACE (*)    Bacteria, UA MANY (*)    All other components within normal  limits  RESPIRATORY PANEL BY RT PCR (FLU A&B, COVID)  URINE CULTURE  LIPASE, BLOOD  I-STAT BETA HCG BLOOD, ED ([facility], [facility], [facility] ONLY)  POC SARS CORONAVIRUS 2 AG -  ED    EKG None  Radiology Ct Head Wo Contrast  Result Date: 06/24/2019 CLINICAL DATA:  Stroke with right-sided weakness and headache. EXAM: CT HEAD WITHOUT CONTRAST TECHNIQUE: Contiguous axial images were obtained from the base of the skull through the vertex without intravenous  contrast. COMPARISON:  09/12/2018 FINDINGS: Brain: No evidence of acute infarction, hemorrhage, hydrocephalus, extra-axial collection or mass lesion/mass effect. Vascular: No hyperdense vessel or unexpected calcification. Skull: Normal. Negative for fracture or focal lesion. Sinuses/Orbits: No acute finding. IMPRESSION: Negative head CT. Electronically Signed   By: Monte Fantasia M.D.   On: 06/24/2019 10:55   Mr Brain Wo Contrast  Result Date: 06/24/2019 CLINICAL DATA:  Right-sided weakness and decreased sensation EXAM: MRI HEAD WITHOUT CONTRAST TECHNIQUE: Multiplanar, multiecho pulse sequences of the brain and surrounding structures were obtained without intravenous contrast. COMPARISON:  2018 FINDINGS: Brain: There is no acute infarction or intracranial hemorrhage. There is no intracranial mass, mass effect, or edema. There is no hydrocephalus or extra-axial fluid collection. Vascular: Major vessel flow voids at the skull base are preserved. Skull and upper cervical spine: Normal marrow signal is preserved. Sinuses/Orbits: Paranasal sinuses are aerated. Orbits are unremarkable. Other: Sella is unremarkable.  Mastoid air cells are clear. IMPRESSION: No evidence of recent infarction, intracranial hemorrhage mass. Electronically Signed   By: Macy Mis M.D.   On: 06/24/2019 13:54   Dg Chest Portable 1 View  Result Date: 06/24/2019 CLINICAL DATA:  Cough and chills EXAM: PORTABLE CHEST 1 VIEW COMPARISON:  03/15/2019 FINDINGS: Generous heart size accentuated by low volumes. Negative aortic and hilar contours. There is no edema, consolidation, effusion, or pneumothorax. Degenerative endplate spurring that is prominent for age. IMPRESSION: Negative for pneumonia. Electronically Signed   By: Monte Fantasia M.D.   On: 06/24/2019 09:57    Procedures Procedures (including critical care time)  Medications Ordered in ED Medications  carvedilol (COREG) tablet 25 mg (has no administration in time range)   sodium chloride flush (NS) 0.9 % injection 3 mL (3 mLs Intravenous Given 06/24/19 1017)  sodium chloride 0.9 % bolus 500 mL (0 mLs Intravenous Stopped 06/24/19 1136)  metoCLOPramide (REGLAN) injection 10 mg (10 mg Intravenous Given 06/24/19 1016)  acetaminophen (TYLENOL) tablet 650 mg (650 mg Oral Given 06/24/19 1033)  magnesium sulfate IVPB 1 g 100 mL (0 g Intravenous Stopped 06/24/19 1253)  amLODipine (NORVASC) tablet 5 mg (5 mg Oral Given 06/24/19 1222)  LORazepam (ATIVAN) injection 1 mg (1 mg Intravenous Given 06/24/19 1249)  cephALEXin (KEFLEX) capsule 500 mg (500 mg Oral Given 06/24/19 1517)     Initial Impression / Assessment and Plan / ED Course  I have reviewed the triage vital signs and the nursing notes.  Pertinent labs & imaging results that were available during my care of the patient were reviewed by me and considered in my medical decision making (see chart for details).  Clinical Course as of Jun 23 1529  Mon Jun 24, 2019  0954 Patient with neuro deficits on exam, patient reports they have been present for 3 to 4 days.  No code stroke at this time, but will obtain CT of the head in addition to patient's other work-up.   [AL]    Clinical Course User Index [AL] Frederica Kuster, PA-C  Patient presenting with a 4-day history of headache, nausea, vomiting, diarrhea, chills.  Patient with neuro deficits initially on arrival.  CT head is negative.  I discussed patient case with neurologist on-call, Dr. Cheral Marker, who advised MR of the brain.  If this is negative, patient be discharged home with everything else is negative.  MRI of the brain is also negative.  Suspect hemiplegic migraine.  Labs are stable for the patient including COVID-19 and influenza testing.  EKG shows sinus tachycardia with normal QTC.  UA does show moderate hematuria, trace leukocytes, many bacteria, 21-50 RBCs, 11-20 WBCs.  Will treat with Keflex.  Will send for culture.  Patient feeling much better after IV  fluids, Reglan, Tylenol, magnesium. She is tolerating oral fluids.  Will refer to PCP for recheck of blood pressure as it remains somewhat elevated after some transient improvement with home medications.  Continue antihypertensives at home and recheck by PCP this week.  Advise follow-up with neurology for further evaluation and treatment of suspected hemiplegic migraines.  Return precautions discussed.  Patient understands and agrees with plan.  Patient vitals stable throughout ED course and discharged in satisfactory condition. I discussed patient case with Dr. Tamera Punt who guided the patient's management and agrees with plan.   Final Clinical Impressions(s) / ED Diagnoses   Final diagnoses:  Acute cystitis with hematuria  Nausea vomiting and diarrhea  Bad headache    ED Discharge Orders         Ordered    cephALEXin (KEFLEX) 500 MG capsule  2 times daily     06/24/19 1523    metoCLOPramide (REGLAN) 10 MG tablet  Every 6 hours     06/24/19 1523           Xsavier Seeley, [place], PA-C 06/24/19 1531    Malvin Johns, MD 06/24/19 1535

## 2019-06-24 NOTE — Discharge Instructions (Addendum)
You can take Tylenol as prescribed over-the-counter, as needed for pain.  Take Reglan every 6 hours as needed for nausea or vomiting.  Take Keflex as prescribed until completed unless you are called with a change based on your urine culture.  Please have your blood pressure rechecked by your PCP, as it has been elevated today.  Continue taking your blood pressure medications as prescribed.  Please return to the emergency department if you develop any new or worsening symptoms.  Please follow-up with neurology for further evaluation and treatment of your suspected hemiplegic migraines.

## 2019-06-24 NOTE — ED Notes (Signed)
Pt returned from MRI and placed back on monitors

## 2019-06-24 NOTE — ED Notes (Signed)
Pt unable to urinate at this time. Pt aware sample needed.

## 2019-06-24 NOTE — ED Notes (Signed)
Patient transported to MRI 

## 2019-06-24 NOTE — ED Triage Notes (Signed)
Pt reports headache, fatigue, diarrhea and chills. Denies recent sick contacts. Denies cough.

## 2019-06-24 NOTE — ED Notes (Signed)
Patient back from MRI.

## 2019-06-27 LAB — URINE CULTURE: Culture: >=100000 — AB

## 2019-06-28 ENCOUNTER — Telehealth: Payer: Self-pay | Admitting: Emergency Medicine

## 2019-06-28 NOTE — Telephone Encounter (Signed)
Post ED Visit - Positive Culture Follow-up  Culture report reviewed by antimicrobial stewardship pharmacist: [place] Team []  Elenor Quinones, Pharm.D. []  Heide Guile, Pharm.D., BCPS AQ-ID []  Parks Neptune, Pharm.D., BCPS []  Alycia Rossetti, Pharm.D., BCPS []  [place], Pharm.D., BCPS, AAHIVP []  Legrand Como, Pharm.D., BCPS, AAHIVP []  Salome Arnt, PharmD, BCPS []  Johnnette Gourd, PharmD, BCPS []  Hughes Better, PharmD, BCPS [x]  Elicia Lamp, PharmD []  Laqueta Linden, PharmD, BCPS []  Albertina Parr, PharmD  [place] Team []  Leodis Sias, PharmD []  Lindell Spar, PharmD []  Royetta Asal, PharmD []  Graylin Shiver, Rph []  Rema Fendt) Glennon Mac, PharmD []  Arlyn Dunning, PharmD []  Netta Cedars, PharmD []  Dia Sitter, PharmD []  Leone Haven, PharmD []  Gretta Arab, PharmD []  Theodis Shove, PharmD []  Peggyann Juba, PharmD []  Reuel Boom, PharmD   Positive urine culture Treated with Cephalexin, organism sensitive to the same and no further patient follow-up is required at this time.  Larene Beach C Raymar Joiner 06/28/2019, 1:20 PM

## 2019-07-01 ENCOUNTER — Other Ambulatory Visit: Payer: Self-pay

## 2019-07-01 ENCOUNTER — Emergency Department (HOSPITAL_COMMUNITY): Payer: BC Managed Care – PPO

## 2019-07-01 ENCOUNTER — Emergency Department (HOSPITAL_COMMUNITY)
Admission: EM | Admit: 2019-07-01 | Discharge: 2019-07-01 | Disposition: A | Discharge status: Home / Self Care | Payer: BC Managed Care – PPO | Attending: Emergency Medicine | Admitting: Emergency Medicine

## 2019-07-01 ENCOUNTER — Encounter (HOSPITAL_COMMUNITY): Payer: Self-pay | Admitting: Emergency Medicine

## 2019-07-01 DIAGNOSIS — I5032 Chronic diastolic (congestive) heart failure: Secondary | ICD-10-CM | POA: Insufficient documentation

## 2019-07-01 DIAGNOSIS — R1030 Lower abdominal pain, unspecified: Secondary | ICD-10-CM | POA: Diagnosis not present

## 2019-07-01 DIAGNOSIS — Z794 Long term (current) use of insulin: Secondary | ICD-10-CM | POA: Insufficient documentation

## 2019-07-01 DIAGNOSIS — R109 Unspecified abdominal pain: Secondary | ICD-10-CM | POA: Insufficient documentation

## 2019-07-01 DIAGNOSIS — Z79899 Other long term (current) drug therapy: Secondary | ICD-10-CM | POA: Insufficient documentation

## 2019-07-01 DIAGNOSIS — N183 Chronic kidney disease, stage 3 unspecified: Secondary | ICD-10-CM | POA: Insufficient documentation

## 2019-07-01 DIAGNOSIS — R300 Dysuria: Secondary | ICD-10-CM

## 2019-07-01 DIAGNOSIS — R509 Fever, unspecified: Secondary | ICD-10-CM | POA: Diagnosis not present

## 2019-07-01 DIAGNOSIS — R110 Nausea: Secondary | ICD-10-CM | POA: Diagnosis not present

## 2019-07-01 DIAGNOSIS — E1122 Type 2 diabetes mellitus with diabetic chronic kidney disease: Secondary | ICD-10-CM | POA: Diagnosis not present

## 2019-07-01 DIAGNOSIS — R350 Frequency of micturition: Secondary | ICD-10-CM | POA: Insufficient documentation

## 2019-07-01 DIAGNOSIS — I130 Hypertensive heart and chronic kidney disease with heart failure and stage 1 through stage 4 chronic kidney disease, or unspecified chronic kidney disease: Secondary | ICD-10-CM | POA: Diagnosis not present

## 2019-07-01 LAB — BASIC METABOLIC PANEL
Anion gap: 10 (ref 5–15)
BUN: 25 mg/dL — ABNORMAL HIGH (ref 6–20)
CO2: 20 mmol/L — ABNORMAL LOW (ref 22–32)
Calcium: 8.3 mg/dL — ABNORMAL LOW (ref 8.9–10.3)
Chloride: 109 mmol/L (ref 98–111)
Creatinine, Ser: 2.08 mg/dL — ABNORMAL HIGH (ref 0.44–1.00)
GFR calc Af Amer: 34 mL/min — ABNORMAL LOW (ref >60)
GFR calc non Af Amer: 29 mL/min — ABNORMAL LOW (ref >60)
Glucose, Bld: 203 mg/dL — ABNORMAL HIGH (ref 70–99)
Potassium: 4 mmol/L (ref 3.5–5.1)
Sodium: 139 mmol/L (ref 135–145)

## 2019-07-01 LAB — CBC WITH DIFFERENTIAL/PLATELET
Abs Immature Granulocytes: 0.03 10*3/uL (ref 0.00–0.07)
Basophils Absolute: 0.1 10*3/uL (ref 0.0–0.1)
Basophils Relative: 1 %
Eosinophils Absolute: 0.3 10*3/uL (ref 0.0–0.5)
Eosinophils Relative: 3 %
HCT: 30.7 % — ABNORMAL LOW (ref 36.0–46.0)
Hemoglobin: 10 g/dL — ABNORMAL LOW (ref 12.0–15.0)
Immature Granulocytes: 0 %
Lymphocytes Relative: 26 %
Lymphs Abs: 2.4 10*3/uL (ref 0.7–4.0)
MCH: 29.2 pg (ref 26.0–34.0)
MCHC: 32.6 g/dL (ref 30.0–36.0)
MCV: 89.8 fL (ref 80.0–100.0)
Monocytes Absolute: 0.5 10*3/uL (ref 0.1–1.0)
Monocytes Relative: 5 %
Neutro Abs: 6.1 10*3/uL (ref 1.7–7.7)
Neutrophils Relative %: 65 %
Platelets: 370 10*3/uL (ref 150–400)
RBC: 3.42 MIL/uL — ABNORMAL LOW (ref 3.87–5.11)
RDW: 12.9 % (ref 11.5–15.5)
WBC: 9.4 10*3/uL (ref 4.0–10.5)
nRBC: 0 % (ref 0.0–0.2)

## 2019-07-01 LAB — URINALYSIS, ROUTINE W REFLEX MICROSCOPIC
Bilirubin Urine: NEGATIVE
Glucose, UA: 150 mg/dL — AB
Hgb urine dipstick: NEGATIVE
Ketones, ur: NEGATIVE mg/dL
Leukocytes,Ua: NEGATIVE
Nitrite: NEGATIVE
Protein, ur: 100 mg/dL — AB
Specific Gravity, Urine: 1.019 (ref 1.005–1.030)
pH: 5 (ref 5.0–8.0)

## 2019-07-01 LAB — POC URINE PREG, ED: Preg Test, Ur: NEGATIVE

## 2019-07-01 MED ORDER — ONDANSETRON HCL 4 MG/2ML IJ SOLN
4.0000 mg | Freq: Once | INTRAMUSCULAR | Status: AC
  Administered 2019-07-01: 4 mg via INTRAVENOUS
  Filled 2019-07-01: qty 2

## 2019-07-01 MED ORDER — SODIUM CHLORIDE 0.9 % IV BOLUS
1000.0000 mL | Freq: Once | INTRAVENOUS | Status: AC
  Administered 2019-07-01: 1000 mL via INTRAVENOUS

## 2019-07-01 MED ORDER — OXYCODONE-ACETAMINOPHEN 5-325 MG PO TABS
1.0000 | ORAL_TABLET | Freq: Once | ORAL | Status: AC
  Administered 2019-07-01: 1 via ORAL
  Filled 2019-07-01: qty 1

## 2019-07-01 MED ORDER — MORPHINE SULFATE (PF) 4 MG/ML IV SOLN
4.0000 mg | Freq: Once | INTRAVENOUS | Status: AC
  Administered 2019-07-01: 4 mg via INTRAVENOUS
  Filled 2019-07-01: qty 1

## 2019-07-01 MED ORDER — CARVEDILOL 12.5 MG PO TABS
25.0000 mg | ORAL_TABLET | Freq: Once | ORAL | Status: AC
  Administered 2019-07-01: 25 mg via ORAL
  Filled 2019-07-01: qty 2

## 2019-07-01 NOTE — ED Triage Notes (Signed)
Pt arrives to ED from home with complaints of on going dysuria since being dx with UTI on 12/7. Patient states she took all her antibiotics but it did not help. Pain has now moved to left flank. Patients urine culture was positive for klebsiella pneumoniae.

## 2019-07-01 NOTE — Discharge Instructions (Signed)
Your labs and CT scan are reassuring today. Your urine looks much improved since last week and is not showing much sign of persistent infection. It is important  you stay hydrated and take your medications as directed. Please follow closely with your primary care provider for recheck in 2 days.  Return to the ER for worsening symptoms, persistent high fever, uncontrollable vomiting.

## 2019-07-01 NOTE — ED Provider Notes (Signed)
[facility] EMERGENCY DEPARTMENT Provider Note   CSN: [id number] Arrival date & time: 07/01/19  0757     History Chief Complaint  Patient presents with  . Dysuria  . Flank Pain    Aalliyah Mcelreath is a 38 y.o. female with past medical history of type 2 diabetes, CKD stage III, hypertension, presenting to the emergency department with complaint of persistent dysuria since her last visit to the ED on 06/24/2019.  She was treated for UTI with 7-day course of Keflex which she finished yesterday.  She states her symptoms have somewhat worsened with left flank pain that is sharp in nature.  It comes and goes and also occurs when she urinates.  She states she has increased frequency of urination as well.  She has nausea without vomiting intermittently.  She reports intermittent fevers.  She has been treating her symptoms with Tylenol and ibuprofen.  Denies hematuria or malodorous urine.  Denies abdominal pain, diarrhea, constipation.  No known history of nephrolithiasis.  She does report an episode of pyelonephritis which feels similar. She is noted to be hypertensive in triage, she has not taken her blood pressure medications this morning.  The history is provided by the patient and medical records.       Past Medical History:  Diagnosis Date  . Anemia   . Anxiety   . Chronic bronchitis ([place])   . Hypertension   . Increased frequency of headaches   . Morbid obesity ([place])   . Sleep apnea   . Type II diabetes mellitus [facility])     Patient Active Problem List   Diagnosis Date Noted  . AKI (acute kidney injury) ([place])   . CKD (chronic kidney disease), stage III   . Obesity, Class III, BMI 40-49.9 (morbid obesity) ([place])   . Insulin dependent diabetes mellitus   . Anemia of chronic disease   . HLD (hyperlipidemia) 07/02/2018  . Acute on chronic diastolic CHF (congestive heart failure) ([place]) 07/02/2018  . Depression 07/02/2018  . OSA (obstructive sleep apnea) 07/02/2018  .  Acute on chronic diastolic (congestive) heart failure ([place]) 07/02/2018  . Cellulitis of right lower extremity 07/02/2018  . Anemia 07/02/2018  . Type 2 diabetes mellitus with hyperglycemia, with long-term current use of insulin ([place]) 07/02/2018  . Chest pain 05/04/2018  . Hypertensive urgency 05/03/2018  . Left facial numbness 05/03/2018  . Headache 05/03/2018  . Elevated troponin 05/03/2018  . PAF (paroxysmal atrial fibrillation) ([place]) 05/25/2017  . Sepsis ([place]) 05/03/2017    Class: Present on Admission  . Acute renal failure superimposed on stage 3 chronic kidney disease ([place]) 05/03/2017    Class: Stage 3  . Neck pain   . Acidosis, metabolic   . Hyponatremia   . Type II diabetes mellitus with renal manifestations ([place]) 04/30/2017  . Normocytic anemia 04/30/2017  . Staphylococcus aureus bacteremia 03/15/2016  . Streptococcal bacteremia 03/15/2016  . Tachycardia 03/14/2016  . Fever 03/14/2016  . Bad headache 03/14/2016  . Morbid obesity ([place])   . Diabetes ([place]) 01/01/2014    Past Surgical History:  Procedure Laterality Date  . BARIATRIC SURGERY    . BREAST REDUCTION SURGERY  03/21/2017  . CARDIAC CATHETERIZATION  01/06/2016  . CARDIAC CATHETERIZATION N/A 01/06/2016   Procedure: Left Heart Cath and Coronary Angiography;  Surgeon: Wellington Hampshire, MD;  Location: [facility];  Service: Cardiovascular;  Laterality: N/A;  . CESAREAN SECTION  08/2014     OB History    Gravida  1   Para      Term      Preterm      AB      Living        SAB      TAB      Ectopic      Multiple      Live Births              Family History  Problem Relation Age of Onset  . Diabetes Mother   . Hypertension Mother   . Thyroid disease Mother   . Kidney disease Maternal Grandmother   . Diabetes Maternal Grandmother   . Heart attack Other     Social History   Tobacco Use  . Smoking status: Never Smoker  . Smokeless tobacco: Never Used  Substance Use Topics  .  Alcohol use: No  . Drug use: No    Home Medications Prior to Admission medications   Medication Sig Start Date End Date Taking? Authorizing Provider  acetaminophen (TYLENOL) 500 MG tablet Take 1,000 mg by mouth every 6 (six) hours as needed for moderate pain.    [provider]  amLODipine (NORVASC) 2.5 MG tablet Take 2.5 mg by mouth daily.    [provider]  atorvastatin (LIPITOR) 10 MG tablet Take 10 mg by mouth every evening. 03/08/18   [provider]  carvedilol (COREG) 25 MG tablet Take 1 tablet (25 mg total) by mouth 2 (two) times daily with a meal. 05/06/18   Lady Deutscher, MD  cephALEXin (KEFLEX) 500 MG capsule Take 1 capsule (500 mg total) by mouth 2 (two) times daily. 06/24/19   Law, Bea Graff, PA-C  cyclobenzaprine (FLEXERIL) 10 MG tablet Take 1 tablet (10 mg total) by mouth 2 (two) times daily as needed for muscle spasms. 03/15/19   Khatri, Hina, PA-C  diphenhydrAMINE (BENADRYL) 25 MG tablet Take 25 mg by mouth every 6 (six) hours as needed for allergies.    [provider]  DULoxetine (CYMBALTA) 60 MG capsule Take 60 mg by mouth at bedtime. 03/08/18   [provider]  furosemide (LASIX) 20 MG tablet Take 20 mg by mouth daily.    [provider]  metoCLOPramide (REGLAN) 10 MG tablet Take 1 tablet (10 mg total) by mouth every 6 (six) hours. 06/24/19   Law, Bea Graff, PA-C  Vitamin D, Ergocalciferol, (DRISDOL) 1.25 MG (50000 UT) CAPS capsule Take 50,000 Units by mouth once a week. Thursday 09/03/18   [provider]    Allergies    Patient has no known allergies.  Review of Systems   Review of Systems  Constitutional: Positive for fever.  Gastrointestinal: Positive for nausea.  Genitourinary: Positive for dysuria, flank pain and frequency.  All other systems reviewed and are negative.   Physical Exam Updated Vital Signs BP (!) 178/104   Pulse 82   Temp 98.4 F (36.9 C) (Oral)   Resp 18   SpO2 100%    Physical Exam Vitals and nursing note reviewed.  Constitutional:      General: She is not in acute distress.    Appearance: She is well-developed. She is obese. She is not ill-appearing.  HENT:     Head: Normocephalic and atraumatic.  Eyes:     Conjunctiva/sclera: Conjunctivae normal.  Cardiovascular:     Rate and Rhythm: Regular rhythm. Tachycardia present.  Pulmonary:     Effort: Pulmonary effort is normal. No respiratory distress.     Breath sounds: Normal  breath sounds.  Abdominal:     General: Bowel sounds are normal.     Palpations: Abdomen is soft.     Tenderness: There is no abdominal tenderness. There is left CVA tenderness. There is no guarding or rebound.  Skin:    General: Skin is warm.  Neurological:     Mental Status: She is alert.  Psychiatric:        Behavior: Behavior normal.     ED Results / Procedures / Treatments   Labs (all labs ordered are listed, but only abnormal results are displayed) Labs Reviewed  URINALYSIS, ROUTINE W REFLEX MICROSCOPIC - Abnormal; Notable for the following components:      Result Value   APPearance HAZY (*)    Glucose, UA 150 (*)    Protein, ur 100 (*)    Bacteria, UA MANY (*)    All other components within normal limits  BASIC METABOLIC PANEL - Abnormal; Notable for the following components:   CO2 20 (*)    Glucose, Bld 203 (*)    BUN 25 (*)    Creatinine, Ser 2.08 (*)    Calcium 8.3 (*)    GFR calc non Af Amer 29 (*)    GFR calc Af Amer 34 (*)    All other components within normal limits  CBC WITH DIFFERENTIAL/PLATELET - Abnormal; Notable for the following components:   RBC 3.42 (*)    Hemoglobin 10.0 (*)    HCT 30.7 (*)    All other components within normal limits  URINE CULTURE  POC URINE PREG, ED    EKG None  Radiology CT Renal Stone Study  Result Date: 07/01/2019 CLINICAL DATA:  Left flank pain EXAM: CT ABDOMEN AND PELVIS WITHOUT CONTRAST TECHNIQUE: Multidetector CT imaging of the abdomen and pelvis  was performed following the standard protocol without IV contrast. COMPARISON:  10/03/2018 FINDINGS: Lower chest: No acute abnormality. Hepatobiliary: No focal liver abnormality is seen. No gallstones, gallbladder wall thickening, or biliary dilatation. Pancreas: Unremarkable. Spleen: Unremarkable. Adrenals/Urinary Tract: Kidneys are unremarkable. There is no definite ureteral stone. No hydroureter. Bladder is unremarkable. Right adrenal is unremarkable. Stable thickening of the left adrenal. Stomach/Bowel: Evidence of prior gastric bypass. Bowel is normal in caliber. Possible appendix is unremarkable. Vascular/Lymphatic: No significant vascular findings are present. No enlarged abdominal or pelvic lymph nodes. Reproductive: Uterus and bilateral adnexa are unremarkable. Other: Small fat containing ventral hernia above the umbilicus. No ascites. Musculoskeletal: No acute osseous abnormality. IMPRESSION: No findings to account for reported symptoms. Specifically, no urinary tract calculi. Electronically Signed   By: Macy Mis M.D.   On: 07/01/2019 12:06    Procedures Procedures (including critical care time)  Medications Ordered in ED Medications  oxyCODONE-acetaminophen (PERCOCET/ROXICET) 5-325 MG per tablet 1 tablet (has no administration in time range)  sodium chloride 0.9 % bolus 1,000 mL (0 mLs Intravenous Stopped 07/01/19 1223)  carvedilol (COREG) tablet 25 mg (25 mg Oral Given 07/01/19 0852)  morphine 4 MG/ML injection 4 mg (4 mg Intravenous Given 07/01/19 0853)  ondansetron (ZOFRAN) injection 4 mg (4 mg Intravenous Given 07/01/19 [id number])    ED Course  I have reviewed the triage vital signs and the nursing notes.  Pertinent labs & imaging results that were available during my care of the patient were reviewed by me and considered in my medical decision making (see chart for details).    MDM Rules/Calculators/A&P  Pt presenting to the ED with persistent  urinary symptoms after being diagnosed with UTI on 06/24/2019.  She completed a 1 week course of Keflex, urine culture was sensitive to cefazolin.  She reports persistence of symptoms with some left-sided flank pain as well.  On arrival she is slightly tachycardic though afebrile and in no distress.  She is hypertensive though likely due to not taking her medications this morning.  Abdominal exam is benign.  Provided IV fluids, pain medication and one of her morning doses of blood pressure medications (she is unsure of the other dose of her new medication, losartan).  Labs are reassuring and appear relatively unchanged since last week.  Pregnancy test is negative.  Urine today shows significant improvement since 1 week ago, 0-5 white cells, negative leuks and negative nitrites.  Culture sent.  Discussed with Dr. Darl Householder, proceed with CT stone study for further evaluation of possible cause of patient's flank pain though is negative.  On reevaluation she reports significant improvement in symptoms.  Heart rate normalized, blood pressure improving.  At this time do not believe second course of antibiotic is indicated due to urinalysis results today however patient is aware there is culture pending.  She is instructed to treat her symptoms and follow-up closely with her primary care provider in 2 days.  She is instructed to return to the ED should symptoms worsen.  Overall she is well-appearing and in no distress, agreeable to plan and safe for discharge.  Discussed results, findings, treatment and follow up. Patient advised of return precautions. Patient verbalized understanding and agreed with plan.  Final Clinical Impression(s) / ED Diagnoses Final diagnoses:  Dysuria  Flank pain    Rx / DC Orders ED Discharge Orders    None       Trent Gabler, [place] N, PA-C 07/01/19 1315    Drenda Freeze, MD 07/01/19 1436

## 2019-07-01 NOTE — ED Notes (Signed)
Patient verbalizes understanding of discharge instructions. Opportunity for questioning and answers were provided. Armband removed by staff, pt discharged from ED.  

## 2019-07-03 DIAGNOSIS — Z20828 Contact with and (suspected) exposure to other viral communicable diseases: Secondary | ICD-10-CM | POA: Diagnosis not present

## 2019-07-03 DIAGNOSIS — U071 COVID-19: Secondary | ICD-10-CM | POA: Diagnosis not present

## 2019-07-03 DIAGNOSIS — N1831 Chronic kidney disease, stage 3a: Secondary | ICD-10-CM | POA: Diagnosis not present

## 2019-07-03 LAB — URINE CULTURE: Culture: >=100000 — AB

## 2019-07-04 ENCOUNTER — Telehealth: Payer: Self-pay

## 2019-07-04 NOTE — Telephone Encounter (Signed)
Symptom check fo [facility] ED 07/01/2019  No urinary problems. covid positive now Saw PCP

## 2019-07-04 NOTE — Progress Notes (Signed)
ED Antimicrobial Stewardship Positive Culture Follow Up   Jacqueline [name] is an 38 y.o. female who presented to [facility] on 07/01/2019 with a chief complaint of  Chief Complaint  Patient presents with  . Dysuria  . Flank Pain   Presenting with ongoing dysuria (recently diagnosed with UTI 12/7) - finished cephalexin abx. Now with L flank pain.   Recent Results (from the past 720 hour(s))  Respiratory Panel by RT PCR (Flu A&B, Covid) - Nasopharyngeal Swab     Status: None   Collection Time: 06/24/19  2:08 PM   Specimen: Nasopharyngeal Swab  Result Value Ref Range Status   SARS Coronavirus 2 by RT PCR NEGATIVE NEGATIVE Final    Comment: (NOTE) SARS-CoV-2 target nucleic acids are NOT DETECTED. The SARS-CoV-2 RNA is generally detectable in upper respiratoy specimens during the acute phase of infection. The lowest concentration of SARS-CoV-2 viral copies this assay can detect is 131 copies/mL. A negative result does not preclude SARS-Cov-2 infection and should not be used as the sole basis for treatment or other patient management decisions. A negative result may occur with  improper specimen collection/handling, submission of specimen other than nasopharyngeal swab, presence of viral mutation(s) within the areas targeted by this assay, and inadequate number of viral copies (<131 copies/mL). A negative result must be combined with clinical observations, patient history, and epidemiological information. The expected result is Negative. Fact Sheet for Patients:  PinkCheek.be Fact Sheet for Healthcare Providers:  GravelBags.it This test is not yet ap proved or cleared by the [place] FDA and  has been authorized for detection and/or diagnosis of SARS-CoV-2 by FDA under an Emergency Use Authorization (EUA). This EUA will remain  in effect (meaning this test can be used) for the duration of the COVID-19 declaration under Section  564(b)(1) of the Act, 21 U.S.C. section 360bbb-3(b)(1), unless the authorization is terminated or revoked sooner.    Influenza A by PCR NEGATIVE NEGATIVE Final   Influenza B by PCR NEGATIVE NEGATIVE Final    Comment: (NOTE) The Xpert Xpress SARS-CoV-2/FLU/RSV assay is intended as an aid in  the diagnosis of influenza from Nasopharyngeal swab specimens and  should not be used as a sole basis for treatment. Nasal washings and  aspirates are unacceptable for Xpert Xpress SARS-CoV-2/FLU/RSV  testing. Fact Sheet for Patients: PinkCheek.be Fact Sheet for Healthcare Providers: GravelBags.it This test is not yet approved or cleared by the [place] FDA and  has been authorized for detection and/or diagnosis of SARS-CoV-2 by  FDA under an Emergency Use Authorization (EUA). This EUA will remain  in effect (meaning this test can be used) for the duration of the  Covid-19 declaration under Section 564(b)(1) of the Act, 21  U.S.C. section 360bbb-3(b)(1), unless the authorization is  terminated or revoked. Performed at [facility], [place] [address] [place], [place] [address]   Urine culture     Status: Abnormal   Collection Time: 06/24/19  2:27 PM   Specimen: Urine, Random  Result Value Ref Range Status   Specimen Description URINE, RANDOM  Final   Special Requests   Final    NONE Performed at [facility], [place] [address], [place], [place] [address]    Culture >=100,000 COLONIES/mL KLEBSIELLA PNEUMONIAE (A)  Final   Report Status 06/27/2019 FINAL  Final   Organism ID, Bacteria KLEBSIELLA PNEUMONIAE (A)  Final      Susceptibility   Klebsiella pneumoniae - MIC*    AMPICILLIN >=32 RESISTANT Resistant  CEFAZOLIN <=4 SENSITIVE Sensitive     CEFTRIAXONE <=1 SENSITIVE Sensitive     CIPROFLOXACIN <=0.25 SENSITIVE Sensitive     GENTAMICIN <=1 SENSITIVE Sensitive     IMIPENEM <=0.25 SENSITIVE Sensitive      NITROFURANTOIN 32 SENSITIVE Sensitive     TRIMETH/SULFA <=20 SENSITIVE Sensitive     AMPICILLIN/SULBACTAM 4 SENSITIVE Sensitive     PIP/TAZO <=4 SENSITIVE Sensitive     * >=100,000 COLONIES/mL KLEBSIELLA PNEUMONIAE  Urine culture     Status: Abnormal   Collection Time: 07/01/19  8:50 AM   Specimen: Urine, Random  Result Value Ref Range Status   Specimen Description URINE, RANDOM  Final   Special Requests   Final    NONE Performed at [facility], [place] [address] [place], [place] [address]    Culture >=100,000 COLONIES/mL KLEBSIELLA PNEUMONIAE (A)  Final   Report Status 07/03/2019 FINAL  Final   Organism ID, Bacteria KLEBSIELLA PNEUMONIAE (A)  Final      Susceptibility   Klebsiella pneumoniae - MIC*    AMPICILLIN RESISTANT Resistant     CEFAZOLIN <=4 SENSITIVE Sensitive     CEFTRIAXONE <=1 SENSITIVE Sensitive     CIPROFLOXACIN <=0.25 SENSITIVE Sensitive     GENTAMICIN <=1 SENSITIVE Sensitive     IMIPENEM <=0.25 SENSITIVE Sensitive     NITROFURANTOIN 32 SENSITIVE Sensitive     TRIMETH/SULFA <=20 SENSITIVE Sensitive     AMPICILLIN/SULBACTAM 4 SENSITIVE Sensitive     PIP/TAZO <=4 SENSITIVE Sensitive     * >=100,000 COLONIES/mL KLEBSIELLA PNEUMONIAE    [x]  Patient discharged originally without antimicrobial agent and treatment is now indicated  New antibiotic plan: Confirm patient follow up with PCP - if no symptom improvement, could consider cefdinir 300 mg twice daily (unless addressed by PCP).  ED Provider: [address], PA-C   Julien Girt 07/04/2019, 1:01 PM Clinical Pharmacist Monday - Friday phone -  [phone] Saturday - Sunday phone - [phone]

## 2019-07-05 ENCOUNTER — Other Ambulatory Visit: Payer: Self-pay

## 2019-07-05 ENCOUNTER — Observation Stay (HOSPITAL_COMMUNITY): Payer: BC Managed Care – PPO

## 2019-07-05 ENCOUNTER — Encounter (HOSPITAL_COMMUNITY): Payer: Self-pay | Admitting: Emergency Medicine

## 2019-07-05 ENCOUNTER — Observation Stay (HOSPITAL_COMMUNITY)
Admission: EM | Admit: 2019-07-05 | Discharge: 2019-07-07 | DRG: 178 | Disposition: A | Discharge status: Home / Self Care | Payer: BC Managed Care – PPO | Attending: Family Medicine | Admitting: Family Medicine

## 2019-07-05 ENCOUNTER — Emergency Department (HOSPITAL_COMMUNITY): Payer: BC Managed Care – PPO

## 2019-07-05 ENCOUNTER — Observation Stay (HOSPITAL_BASED_OUTPATIENT_CLINIC_OR_DEPARTMENT_OTHER): Payer: BC Managed Care – PPO

## 2019-07-05 DIAGNOSIS — E1122 Type 2 diabetes mellitus with diabetic chronic kidney disease: Secondary | ICD-10-CM | POA: Diagnosis present

## 2019-07-05 DIAGNOSIS — Z8249 Family history of ischemic heart disease and other diseases of the circulatory system: Secondary | ICD-10-CM | POA: Diagnosis not present

## 2019-07-05 DIAGNOSIS — I130 Hypertensive heart and chronic kidney disease with heart failure and stage 1 through stage 4 chronic kidney disease, or unspecified chronic kidney disease: Secondary | ICD-10-CM | POA: Diagnosis not present

## 2019-07-05 DIAGNOSIS — Z9884 Bariatric surgery status: Secondary | ICD-10-CM

## 2019-07-05 DIAGNOSIS — R0902 Hypoxemia: Secondary | ICD-10-CM | POA: Diagnosis present

## 2019-07-05 DIAGNOSIS — R109 Unspecified abdominal pain: Secondary | ICD-10-CM | POA: Diagnosis not present

## 2019-07-05 DIAGNOSIS — E785 Hyperlipidemia, unspecified: Secondary | ICD-10-CM | POA: Diagnosis present

## 2019-07-05 DIAGNOSIS — Z79899 Other long term (current) drug therapy: Secondary | ICD-10-CM

## 2019-07-05 DIAGNOSIS — Z833 Family history of diabetes mellitus: Secondary | ICD-10-CM | POA: Diagnosis not present

## 2019-07-05 DIAGNOSIS — U071 COVID-19: Principal | ICD-10-CM | POA: Diagnosis present

## 2019-07-05 DIAGNOSIS — R0789 Other chest pain: Secondary | ICD-10-CM | POA: Diagnosis not present

## 2019-07-05 DIAGNOSIS — G4733 Obstructive sleep apnea (adult) (pediatric): Secondary | ICD-10-CM | POA: Diagnosis present

## 2019-07-05 DIAGNOSIS — I10 Essential (primary) hypertension: Secondary | ICD-10-CM | POA: Diagnosis not present

## 2019-07-05 DIAGNOSIS — F419 Anxiety disorder, unspecified: Secondary | ICD-10-CM | POA: Diagnosis present

## 2019-07-05 DIAGNOSIS — M549 Dorsalgia, unspecified: Secondary | ICD-10-CM

## 2019-07-05 DIAGNOSIS — N183 Chronic kidney disease, stage 3 unspecified: Secondary | ICD-10-CM | POA: Diagnosis present

## 2019-07-05 DIAGNOSIS — D631 Anemia in chronic kidney disease: Secondary | ICD-10-CM | POA: Diagnosis not present

## 2019-07-05 DIAGNOSIS — F329 Major depressive disorder, single episode, unspecified: Secondary | ICD-10-CM | POA: Diagnosis present

## 2019-07-05 DIAGNOSIS — R079 Chest pain, unspecified: Secondary | ICD-10-CM

## 2019-07-05 DIAGNOSIS — I160 Hypertensive urgency: Secondary | ICD-10-CM | POA: Diagnosis not present

## 2019-07-05 DIAGNOSIS — Z8349 Family history of other endocrine, nutritional and metabolic diseases: Secondary | ICD-10-CM

## 2019-07-05 DIAGNOSIS — I480 Paroxysmal atrial fibrillation: Secondary | ICD-10-CM | POA: Diagnosis not present

## 2019-07-05 DIAGNOSIS — I5032 Chronic diastolic (congestive) heart failure: Secondary | ICD-10-CM | POA: Diagnosis present

## 2019-07-05 DIAGNOSIS — Z6839 Body mass index (BMI) 39.0-39.9, adult: Secondary | ICD-10-CM | POA: Diagnosis not present

## 2019-07-05 DIAGNOSIS — N920 Excessive and frequent menstruation with regular cycle: Secondary | ICD-10-CM | POA: Diagnosis not present

## 2019-07-05 LAB — BASIC METABOLIC PANEL
Anion gap: 8 (ref 5–15)
BUN: 20 mg/dL (ref 6–20)
CO2: 20 mmol/L — ABNORMAL LOW (ref 22–32)
Calcium: 8.2 mg/dL — ABNORMAL LOW (ref 8.9–10.3)
Chloride: 112 mmol/L — ABNORMAL HIGH (ref 98–111)
Creatinine, Ser: 2.25 mg/dL — ABNORMAL HIGH (ref 0.44–1.00)
GFR calc Af Amer: 31 mL/min — ABNORMAL LOW (ref >60)
GFR calc non Af Amer: 27 mL/min — ABNORMAL LOW (ref >60)
Glucose, Bld: 124 mg/dL — ABNORMAL HIGH (ref 70–99)
Potassium: 3.9 mmol/L (ref 3.5–5.1)
Sodium: 140 mmol/L (ref 135–145)

## 2019-07-05 LAB — CBC WITH DIFFERENTIAL/PLATELET
Abs Immature Granulocytes: 0.03 10*3/uL (ref 0.00–0.07)
Basophils Absolute: 0 10*3/uL (ref 0.0–0.1)
Basophils Relative: 1 %
Eosinophils Absolute: 0.2 10*3/uL (ref 0.0–0.5)
Eosinophils Relative: 3 %
HCT: 30.3 % — ABNORMAL LOW (ref 36.0–46.0)
Hemoglobin: 9.7 g/dL — ABNORMAL LOW (ref 12.0–15.0)
Immature Granulocytes: 1 %
Lymphocytes Relative: 28 %
Lymphs Abs: 1.6 10*3/uL (ref 0.7–4.0)
MCH: 29.2 pg (ref 26.0–34.0)
MCHC: 32 g/dL (ref 30.0–36.0)
MCV: 91.3 fL (ref 80.0–100.0)
Monocytes Absolute: 0.6 10*3/uL (ref 0.1–1.0)
Monocytes Relative: 9 %
Neutro Abs: 3.5 10*3/uL (ref 1.7–7.7)
Neutrophils Relative %: 58 %
Platelets: 326 10*3/uL (ref 150–400)
RBC: 3.32 MIL/uL — ABNORMAL LOW (ref 3.87–5.11)
RDW: 13 % (ref 11.5–15.5)
WBC: 5.9 10*3/uL (ref 4.0–10.5)
nRBC: 0 % (ref 0.0–0.2)

## 2019-07-05 LAB — D-DIMER, QUANTITATIVE: D-Dimer, Quant: 1.24 ug/mL-FEU — ABNORMAL HIGH (ref 0.00–0.50)

## 2019-07-05 LAB — TROPONIN I (HIGH SENSITIVITY)
Troponin I (High Sensitivity): 7 ng/L (ref <18)
Troponin I (High Sensitivity): 7 ng/L (ref <18)

## 2019-07-05 LAB — I-STAT BETA HCG BLOOD, ED (MC, WL, AP ONLY): I-stat hCG, quantitative: <5 m[IU]/mL (ref <5)

## 2019-07-05 LAB — ECHOCARDIOGRAM COMPLETE
Height: 64 in
Weight: 3696 oz

## 2019-07-05 LAB — POC SARS CORONAVIRUS 2 AG -  ED: SARS Coronavirus 2 Ag: POSITIVE — AB

## 2019-07-05 LAB — BRAIN NATRIURETIC PEPTIDE: B Natriuretic Peptide: 166.5 pg/mL — ABNORMAL HIGH (ref 0.0–100.0)

## 2019-07-05 MED ORDER — AMLODIPINE BESYLATE 5 MG PO TABS
5.0000 mg | ORAL_TABLET | Freq: Every day | ORAL | Status: DC
  Administered 2019-07-05 – 2019-07-07 (×3): 5 mg via ORAL
  Filled 2019-07-05 (×3): qty 1

## 2019-07-05 MED ORDER — NITROGLYCERIN 0.4 MG SL SUBL
0.4000 mg | SUBLINGUAL_TABLET | SUBLINGUAL | Status: DC | PRN
  Administered 2019-07-05 (×2): 0.4 mg via SUBLINGUAL
  Filled 2019-07-05: qty 1

## 2019-07-05 MED ORDER — LOSARTAN POTASSIUM 50 MG PO TABS
25.0000 mg | ORAL_TABLET | Freq: Every day | ORAL | Status: DC
  Administered 2019-07-06 – 2019-07-07 (×2): 25 mg via ORAL
  Filled 2019-07-05 (×2): qty 1

## 2019-07-05 MED ORDER — BUMETANIDE 1 MG PO TABS: 1.0000 mg | ORAL_TABLET | Freq: Every day | ORAL | Status: DC

## 2019-07-05 MED ORDER — HEPARIN SODIUM (PORCINE) 5000 UNIT/ML IJ SOLN
5000.0000 [IU] | Freq: Three times a day (TID) | INTRAMUSCULAR | Status: DC
  Administered 2019-07-05 – 2019-07-07 (×5): 5000 [IU] via SUBCUTANEOUS
  Filled 2019-07-05 (×5): qty 1

## 2019-07-05 MED ORDER — ASPIRIN EC 81 MG PO TBEC
81.0000 mg | DELAYED_RELEASE_TABLET | Freq: Every day | ORAL | Status: DC
  Administered 2019-07-06 – 2019-07-07 (×2): 81 mg via ORAL
  Filled 2019-07-05 (×2): qty 1

## 2019-07-05 MED ORDER — ASPIRIN 81 MG PO CHEW
324.0000 mg | CHEWABLE_TABLET | Freq: Once | ORAL | Status: AC
  Administered 2019-07-05: 11:00:00 324 mg via ORAL
  Filled 2019-07-05: qty 4

## 2019-07-05 MED ORDER — TECHNETIUM TO 99M ALBUMIN AGGREGATED
1.5000 | Freq: Once | INTRAVENOUS | Status: AC | PRN
  Administered 2019-07-05: 1.5 via INTRAVENOUS

## 2019-07-05 MED ORDER — SERTRALINE HCL 100 MG PO TABS
100.0000 mg | ORAL_TABLET | Freq: Every day | ORAL | Status: DC
  Administered 2019-07-05 – 2019-07-06 (×2): 100 mg via ORAL
  Filled 2019-07-05 (×3): qty 1

## 2019-07-05 MED ORDER — FUROSEMIDE 40 MG PO TABS
40.0000 mg | ORAL_TABLET | Freq: Every day | ORAL | Status: DC
  Administered 2019-07-06 – 2019-07-07 (×2): 40 mg via ORAL
  Filled 2019-07-05: qty 1
  Filled 2019-07-05: qty 2

## 2019-07-05 MED ORDER — DULOXETINE HCL 30 MG PO CPEP
30.0000 mg | ORAL_CAPSULE | Freq: Every morning | ORAL | Status: DC
  Administered 2019-07-05 – 2019-07-07 (×3): 30 mg via ORAL
  Filled 2019-07-05 (×3): qty 1

## 2019-07-05 MED ORDER — HYDRALAZINE HCL 25 MG PO TABS
25.0000 mg | ORAL_TABLET | Freq: Three times a day (TID) | ORAL | Status: DC
  Administered 2019-07-05 (×2): 25 mg via ORAL
  Filled 2019-07-05 (×2): qty 1

## 2019-07-05 MED ORDER — CARVEDILOL 25 MG PO TABS
25.0000 mg | ORAL_TABLET | Freq: Two times a day (BID) | ORAL | Status: DC
  Administered 2019-07-05 – 2019-07-07 (×4): 25 mg via ORAL
  Filled 2019-07-05 (×3): qty 2
  Filled 2019-07-05: qty 1

## 2019-07-05 MED ORDER — ONDANSETRON HCL 4 MG/2ML IJ SOLN: 4.0000 mg | Freq: Four times a day (QID) | INTRAMUSCULAR | Status: DC | PRN

## 2019-07-05 MED ORDER — ATORVASTATIN CALCIUM 10 MG PO TABS
10.0000 mg | ORAL_TABLET | Freq: Every day | ORAL | Status: DC
  Administered 2019-07-06 – 2019-07-07 (×2): 10 mg via ORAL
  Filled 2019-07-05 (×2): qty 1

## 2019-07-05 MED ORDER — ACETAMINOPHEN 500 MG PO TABS
1000.0000 mg | ORAL_TABLET | Freq: Four times a day (QID) | ORAL | Status: DC | PRN
  Administered 2019-07-06 – 2019-07-07 (×3): 1000 mg via ORAL
  Filled 2019-07-05 (×3): qty 2

## 2019-07-05 NOTE — ED Notes (Signed)
Pt went to vq.

## 2019-07-05 NOTE — Progress Notes (Signed)
  Echocardiogram 2D Echocardiogram has been performed.  Jacqueline [name] A Mccartney Brucks 07/05/2019, 3:38 PM

## 2019-07-05 NOTE — ED Triage Notes (Signed)
Patient states she tested positive earlier this week along with her whole family. Onset of midnight last night centra chest pain along with shortness of breath when coughing. Central chest pain radiating into back, describes it as aching.

## 2019-07-05 NOTE — ED Provider Notes (Signed)
[facility] EMERGENCY DEPARTMENT Provider Note   CSN: [id number] Arrival date & time: 07/05/19  [id number]     History Chief Complaint  Patient presents with  . Covid +  . Chest Pain  . Shortness of Breath    Jacqueline [name] is a 38 y.o. female.  HPI  38 year old female presents with chest pain.  Diagnosed with Covid-19 as an outpatient a couple days ago.  Has been having cough and body aches.  However last night developed chest and back pain.  Tightened and dull in the middle and right side of her chest.  When she takes a deep breath she will also noticed some pain in her back.  There is no pain going from her chest to her back.  Does feel short of breath.  Has chronic swelling in her right leg that is unchanged.  Pain is a 9 out of 10.  She took her blood pressure medicine this morning.  Past Medical History:  Diagnosis Date  . Anemia   . Anxiety   . Chronic bronchitis ([place])   . Hypertension   . Increased frequency of headaches   . Morbid obesity ([place])   . Sleep apnea   . Type II diabetes mellitus [facility])     Patient Active Problem List   Diagnosis Date Noted  . AKI (acute kidney injury) ([place])   . CKD (chronic kidney disease), stage III   . Obesity, Class III, BMI 40-49.9 (morbid obesity) ([place])   . Insulin dependent diabetes mellitus   . Anemia of chronic disease   . HLD (hyperlipidemia) 07/02/2018  . Acute on chronic diastolic CHF (congestive heart failure) ([place]) 07/02/2018  . Depression 07/02/2018  . OSA (obstructive sleep apnea) 07/02/2018  . Acute on chronic diastolic (congestive) heart failure ([place]) 07/02/2018  . Cellulitis of right lower extremity 07/02/2018  . Anemia 07/02/2018  . Type 2 diabetes mellitus with hyperglycemia, with long-term current use of insulin ([place]) 07/02/2018  . Chest pain 05/04/2018  . Hypertensive urgency 05/03/2018  . Left facial numbness 05/03/2018  . Headache 05/03/2018  . Elevated troponin 05/03/2018  . PAF (paroxysmal  atrial fibrillation) ([place]) 05/25/2017  . Sepsis ([place]) 05/03/2017    Class: Present on Admission  . Acute renal failure superimposed on stage 3 chronic kidney disease ([place]) 05/03/2017    Class: Stage 3  . Neck pain   . Acidosis, metabolic   . Hyponatremia   . Type II diabetes mellitus with renal manifestations ([place]) 04/30/2017  . Normocytic anemia 04/30/2017  . Staphylococcus aureus bacteremia 03/15/2016  . Streptococcal bacteremia 03/15/2016  . Tachycardia 03/14/2016  . Fever 03/14/2016  . Bad headache 03/14/2016  . Morbid obesity ([place])   . Diabetes ([place]) 01/01/2014    Past Surgical History:  Procedure Laterality Date  . BARIATRIC SURGERY    . BREAST REDUCTION SURGERY  03/21/2017  . CARDIAC CATHETERIZATION  01/06/2016  . CARDIAC CATHETERIZATION N/A 01/06/2016   Procedure: Left Heart Cath and Coronary Angiography;  Surgeon: Wellington Hampshire, MD;  Location: [place] CV LAB;  Service: Cardiovascular;  Laterality: N/A;  . CESAREAN SECTION  08/2014     OB History    Gravida  1   Para      Term      Preterm      AB      Living        SAB      TAB      Ectopic  Multiple      Live Births              Family History  Problem Relation Age of Onset  . Diabetes Mother   . Hypertension Mother   . Thyroid disease Mother   . Kidney disease Maternal Grandmother   . Diabetes Maternal Grandmother   . Heart attack Other     Social History   Tobacco Use  . Smoking status: Never Smoker  . Smokeless tobacco: Never Used  Substance Use Topics  . Alcohol use: No  . Drug use: No    Home Medications Prior to Admission medications   Medication Sig Start Date End Date Taking? Authorizing Provider  acetaminophen (TYLENOL) 500 MG tablet Take 1,000 mg by mouth every 6 (six) hours as needed for mild pain or moderate pain.    Yes [provider]  atorvastatin (LIPITOR) 10 MG tablet Take 10 mg by mouth daily.  03/08/18  Yes [provider]    bumetanide (BUMEX) 1 MG tablet Take 1 mg by mouth daily. 05/30/19  Yes [provider]  carvedilol (COREG) 25 MG tablet Take 1 tablet (25 mg total) by mouth 2 (two) times daily with a meal. 05/06/18  Yes Lady Deutscher, MD  DULoxetine (CYMBALTA) 30 MG capsule Take 30 mg by mouth every morning. 05/21/19  Yes [provider]  furosemide (LASIX) 40 MG tablet Take 40 mg by mouth daily. 06/23/19  Yes [provider]  losartan (COZAAR) 25 MG tablet Take 25 mg by mouth daily.   Yes [provider]  sertraline (ZOLOFT) 100 MG tablet Take 100 mg by mouth at bedtime. 04/30/19  Yes [provider]  cephALEXin (KEFLEX) 500 MG capsule Take 1 capsule (500 mg total) by mouth 2 (two) times daily. Patient not taking: Reported on 07/05/2019 06/24/19   Frederica Kuster, PA-C  cyclobenzaprine (FLEXERIL) 10 MG tablet Take 1 tablet (10 mg total) by mouth 2 (two) times daily as needed for muscle spasms. Patient not taking: Reported on 07/05/2019 03/15/19   Delia Heady, PA-C  metoCLOPramide (REGLAN) 10 MG tablet Take 1 tablet (10 mg total) by mouth every 6 (six) hours. Patient not taking: Reported on 07/05/2019 06/24/19   Frederica Kuster, PA-C  Vitamin D, Ergocalciferol, (DRISDOL) 1.25 MG (50000 UT) CAPS capsule Take 50,000 Units by mouth once a week. Thursday 09/03/18   [provider]    Allergies    Patient has no known allergies.  Review of Systems   Review of Systems  Constitutional: Positive for fever (low grade).  Respiratory: Positive for cough and shortness of breath.   Cardiovascular: Positive for chest pain and leg swelling.  Gastrointestinal: Negative for abdominal pain.  Musculoskeletal: Positive for back pain.  All other systems reviewed and are negative.   Physical Exam Updated Vital Signs BP (!) 178/103   Pulse 87   Temp 99.2 F (37.3 C) (Oral)   Resp 17   Ht 5\' 4"  (1.626 m)   Wt 104.8 kg   LMP 06/28/2019   SpO2 100%   BMI 39.65  kg/m   Physical Exam Vitals and nursing note reviewed.  Constitutional:      General: She is not in acute distress.    Appearance: She is well-developed. She is obese. She is not ill-appearing or diaphoretic.  HENT:     Head: Normocephalic and atraumatic.     Right Ear: External ear normal.     Left Ear: External ear normal.  Nose: Nose normal.  Eyes:     General:        Right eye: No discharge.        Left eye: No discharge.  Cardiovascular:     Rate and Rhythm: Normal rate and regular rhythm.     Pulses:          Radial pulses are 2+ on the right side and 2+ on the left side.       Dorsalis pedis pulses are 2+ on the right side and 2+ on the left side.     Heart sounds: Normal heart sounds. No murmur.  Pulmonary:     Effort: Pulmonary effort is normal.     Breath sounds: Normal breath sounds.  Chest:     Chest wall: No tenderness.  Abdominal:     Palpations: Abdomen is soft.     Tenderness: There is no abdominal tenderness.  Skin:    General: Skin is warm and dry.  Neurological:     Mental Status: She is alert.  Psychiatric:        Mood and Affect: Mood is not anxious.     ED Results / Procedures / Treatments   Labs (all labs ordered are listed, but only abnormal results are displayed) Labs Reviewed  BASIC METABOLIC PANEL - Abnormal; Notable for the following components:      Result Value   Chloride 112 (*)    CO2 20 (*)    Glucose, Bld 124 (*)    Creatinine, Ser 2.25 (*)    Calcium 8.2 (*)    GFR calc non Af Amer 27 (*)    GFR calc Af Amer 31 (*)    All other components within normal limits  BRAIN NATRIURETIC PEPTIDE - Abnormal; Notable for the following components:   B Natriuretic Peptide 166.5 (*)    All other components within normal limits  CBC WITH DIFFERENTIAL/PLATELET - Abnormal; Notable for the following components:   RBC 3.32 (*)    Hemoglobin 9.7 (*)    HCT 30.3 (*)    All other components within normal limits  D-DIMER, QUANTITATIVE (NOT  AT [facility]) - Abnormal; Notable for the following components:   D-Dimer, Quant 1.24 (*)    All other components within normal limits  HIV ANTIBODY (ROUTINE TESTING W REFLEX)  I-STAT BETA HCG BLOOD, ED ([facility], [facility], [facility] ONLY)  POC SARS CORONAVIRUS 2 AG -  ED  TROPONIN I (HIGH SENSITIVITY)  TROPONIN I (HIGH SENSITIVITY)    EKG EKG Interpretation  Date/Time:  Friday July 05 2019 09:31:27 EST Ventricular Rate:  106 PR Interval:  142 QRS Duration: 78 QT Interval:  346 QTC Calculation: 459 R Axis:   120 Text Interpretation: Sinus tachycardia Right axis deviation Abnormal ECG ST/T changes similar to Jun 24 2019 Confirmed by Sherwood Gambler [phone]) on 07/05/2019 9:40:12 AM   Radiology DG Chest Portable 1 View  Result Date: 07/05/2019 CLINICAL DATA:  COVID-19 positive, chest pain EXAM: PORTABLE CHEST 1 VIEW COMPARISON:  06/24/2019 FINDINGS: The heart size and mediastinal contours are within normal limits. Both lungs are clear. The visualized skeletal structures are unremarkable. Tiny focus of mineralization adjacent to the greater tuberosity of the right humeral head suggesting rotator cuff calcific tendinopathy. IMPRESSION: No acute cardiopulmonary findings. Electronically Signed   By: Davina Poke M.D.   On: 07/05/2019 11:02    Procedures Procedures (including critical care time)  Medications Ordered in ED Medications  nitroGLYCERIN (NITROSTAT) SL tablet 0.4 mg (0.4 mg Sublingual  Given 07/05/19 1042)  acetaminophen (TYLENOL) tablet 1,000 mg (has no administration in time range)  atorvastatin (LIPITOR) tablet 10 mg (has no administration in time range)  bumetanide (BUMEX) tablet 1 mg (has no administration in time range)  carvedilol (COREG) tablet 25 mg (has no administration in time range)  furosemide (LASIX) tablet 40 mg (has no administration in time range)  losartan (COZAAR) tablet 25 mg (has no administration in time range)  amLODipine (NORVASC) tablet 5 mg (has no administration  in time range)  DULoxetine (CYMBALTA) DR capsule 30 mg (has no administration in time range)  sertraline (ZOLOFT) tablet 100 mg (has no administration in time range)  ondansetron (ZOFRAN) injection 4 mg (has no administration in time range)  heparin injection 5,000 Units (has no administration in time range)  aspirin chewable tablet 324 mg (324 mg Oral Given 07/05/19 1037)    ED Course  I have reviewed the triage vital signs and the nursing notes.  Pertinent labs & imaging results that were available during my care of the patient were reviewed by me and considered in my medical decision making (see chart for details).    MDM Rules/Calculators/A&P HEAR Score: 4                    Patient was Covid positive at her PCPs office.  Will send confirmatory test here given she is being admitted.  She has nonspecific chest pain.  Given her blood pressure, there was some initial thought of possible dissection but on chart review she is quite often hypertensive like this and her pain is pretty atypical for that.  She has strong pulses in all extremities.  She does not have pain going straight through from her chest to her back but more she has some pleuritic back pain.  I think PE rule out is good, will need VQ scan given her renal dysfunction.  However, I think with her moderate heart score it would be reasonable to keep her overnight and she agrees.  Hospitalist to admit for ACS work-up.  Derek Pharo was evaluated in Emergency Department on 07/05/2019 for the symptoms described in the history of present illness. She was evaluated in the context of the global COVID-19 pandemic, which necessitated consideration that the patient might be at risk for infection with the SARS-CoV-2 virus that causes COVID-19. Institutional protocols and algorithms that pertain to the evaluation of patients at risk for COVID-19 are in a state of rapid change based on information released by regulatory bodies including the CDC and  federal and state organizations. These policies and algorithms were followed during the patient's care in the ED.   Final Clinical Impression(s) / ED Diagnoses Final diagnoses:  Nonspecific chest pain  Hypertensive urgency  COVID-19 virus infection    Rx / DC Orders ED Discharge Orders    None       Sherwood Gambler, MD 07/05/19 1259

## 2019-07-05 NOTE — Consult Note (Addendum)
Cardiology Consultation:   Due to the COVID-19 pandemic, this visit was completed with telemedicine (audio/video) technology to reduce patient and provider exposure as well as to preserve personal protective equipment.   Patient ID: Jacqueline [name] MRN: [id number]; DOB: 01/01/1981  Admit date: 07/05/2019 Date of Consult: 07/05/2019  Primary Care Provider: Audley Hose, MD Primary Cardiologist: Minus Breeding, MD  Primary Electrophysiologist:  None    Patient Profile:   Jacqueline [name] is a 38 y.o. female with a PMH of HTN, chronic diastolic CHF, DM type 2, OSA, morbid obesity s/p gastric bypass, and CKD stage 3 who is being seen today for the evaluation of chest pain at the request of Dr. Roosevelt Locks.  History of Present Illness:   Jacqueline [name] was in her usual state of health until the evening of 07/04/2019 when she developed chest and back pain. She reports pain awoke her from sleep last night. Pressure sensation radiating to her back but not to her neck/jaw/arms. She reported associated SOB and some lightheadedness but no dizziness, diaphoresis, or syncope. She has been feeling a little nauseated and has had headaches for several days. Nothing seems to make the pain better/worse. It has waxed/waned a bit since onset. Given ongoing symptoms she presented to the ED for further evaluation.   She was last evaluated by cardiology at an outpatient visit with Dr. Percival Spanish 10/2018, at which time she was without cardiac complaints. No medication changes occurred at that time and she was recommended for close home monitoring of her blood pressure.  Her last echocardiogram was 04/2018 and showed EF 60-65%, moderate LVH, G2DD, normal wall motion, and mild LAE. Her last ischemic evaluation was a LHC in 2017 which showed normal coronary arteries.  Historically she has had chest pain when her blood pressure is elevated, typically when BP is >180/90. She reports BP has been elevated in the past few weeks. Her PCP  stopped her amlodipine a few months ago and switched her to losartan. She denies recent exertional chest pain or SOB. She has chronic RLE edema for which she had an ultrasound 02/2019 which was negative for DVT. No change in LE edema or PND. She has been having intermittent fevers (Tmax 103 07/03/2019) and has been taking tylenol.   Hospital course:  BP markedly elevated on arrival to 218/108 > 179/92, mildly tachycardic to 104 (improved), otherwise VSS. Labs notable for electrolytes wnl, Cr 2.25, Hgb 9.7, PLT 326, Trop 7, BNP 166.5, Ddimer 1.24, COVID-19+. EKG with sinus tachycardia, rate 107 bpm, non-specific ST-T wave abnormalities, no STE/D; no significant change from previous. VQ scan ordered to r/o PE. US aorta pending to r/o dissection. Echo pending to evaluate LV function. She was admitted to medicine for ongoing evaluation of chest pain and supportive care of COVID-19 infection. Cardiology asked to evaluate.   Past Medical History:  Diagnosis Date  . Anemia   . Anxiety   . Chronic bronchitis ([place])   . Hypertension   . Increased frequency of headaches   . Morbid obesity ([place])   . Sleep apnea   . Type II diabetes mellitus ([place])     Past Surgical History:  Procedure Laterality Date  . BARIATRIC SURGERY    . BREAST REDUCTION SURGERY  03/21/2017  . CARDIAC CATHETERIZATION  01/06/2016  . CARDIAC CATHETERIZATION N/A 01/06/2016   Procedure: Left Heart Cath and Coronary Angiography;  Surgeon: Wellington Hampshire, MD;  Location: [facility];  Service: Cardiovascular;  Laterality: N/A;  . CESAREAN SECTION  08/2014     Home Medications:  Prior to Admission medications   Medication Sig Start Date End Date Taking? Authorizing Provider  acetaminophen (TYLENOL) 500 MG tablet Take 1,000 mg by mouth every 6 (six) hours as needed for mild pain or moderate pain.    Yes [provider]  atorvastatin (LIPITOR) 10 MG tablet Take 10 mg by mouth daily.  03/08/18  Yes [provider]    bumetanide (BUMEX) 1 MG tablet Take 1 mg by mouth daily. 05/30/19  Yes [provider]  carvedilol (COREG) 25 MG tablet Take 1 tablet (25 mg total) by mouth 2 (two) times daily with a meal. 05/06/18  Yes Lady Deutscher, MD  DULoxetine (CYMBALTA) 30 MG capsule Take 30 mg by mouth every morning. 05/21/19  Yes [provider]  furosemide (LASIX) 40 MG tablet Take 40 mg by mouth daily. 06/23/19  Yes [provider]  losartan (COZAAR) 25 MG tablet Take 25 mg by mouth daily.   Yes [provider]  sertraline (ZOLOFT) 100 MG tablet Take 100 mg by mouth at bedtime. 04/30/19  Yes [provider]  cephALEXin (KEFLEX) 500 MG capsule Take 1 capsule (500 mg total) by mouth 2 (two) times daily. Patient not taking: Reported on 07/05/2019 06/24/19   Frederica Kuster, PA-C  cyclobenzaprine (FLEXERIL) 10 MG tablet Take 1 tablet (10 mg total) by mouth 2 (two) times daily as needed for muscle spasms. Patient not taking: Reported on 07/05/2019 03/15/19   Delia Heady, PA-C  metoCLOPramide (REGLAN) 10 MG tablet Take 1 tablet (10 mg total) by mouth every 6 (six) hours. Patient not taking: Reported on 07/05/2019 06/24/19   Frederica Kuster, PA-C  Vitamin D, Ergocalciferol, (DRISDOL) 1.25 MG (50000 UT) CAPS capsule Take 50,000 Units by mouth once a week. Thursday 09/03/18   [provider]    Inpatient Medications: Scheduled Meds: . amLODipine  5 mg Oral Daily  . [START ON 07/06/2019] aspirin EC  81 mg Oral Daily  . [START ON 07/06/2019] atorvastatin  10 mg Oral Daily  . carvedilol  25 mg Oral BID WC  . DULoxetine  30 mg Oral q morning - 10a  . [START ON 07/06/2019] furosemide  40 mg Oral Daily  . heparin  5,000 Units Subcutaneous Q8H  . [START ON 07/06/2019] losartan  25 mg Oral Daily  . sertraline  100 mg Oral QHS   Continuous Infusions:  PRN Meds: acetaminophen, nitroGLYCERIN, ondansetron (ZOFRAN) IV  Allergies:   No Known Allergies  Social History:    Social History   Socioeconomic History  . Marital status: Married    Spouse name: Not on file  . Number of children: Not on file  . Years of education: Not on file  . Highest education level: Not on file  Occupational History  . Not on file  Tobacco Use  . Smoking status: Never Smoker  . Smokeless tobacco: Never Used  Substance and Sexual Activity  . Alcohol use: No  . Drug use: No  . Sexual activity: Yes    Birth control/protection: None  Other Topics Concern  . Not on file  Social History Narrative  . Not on file   Social Determinants of Health   Financial Resource Strain:   . Difficulty of Paying Living Expenses: Not on file  Food Insecurity:   . Worried About Charity fundraiser in the Last Year: Not on file  . Ran Out of Food in the Last Year: Not on  file  Transportation Needs:   . Film/video editor (Medical): Not on file  . Lack of Transportation (Non-Medical): Not on file  Physical Activity:   . Days of Exercise per Week: Not on file  . Minutes of Exercise per Session: Not on file  Stress:   . Feeling of Stress : Not on file  Social Connections:   . Frequency of Communication with Friends and Family: Not on file  . Frequency of Social Gatherings with Friends and Family: Not on file  . Attends Religious Services: Not on file  . Active Member of Clubs or Organizations: Not on file  . Attends Archivist Meetings: Not on file  . Marital Status: Not on file  Intimate Partner Violence:   . Fear of Current or Ex-Partner: Not on file  . Emotionally Abused: Not on file  . Physically Abused: Not on file  . Sexually Abused: Not on file    Family History:    Family History  Problem Relation Age of Onset  . Diabetes Mother   . Hypertension Mother   . Thyroid disease Mother   . Kidney disease Maternal Grandmother   . Diabetes Maternal Grandmother   . Heart attack Other      ROS:  Please see the history of present illness.   All other ROS  reviewed and negative.     Physical Exam/Data:   Vitals:   07/05/19 1215 07/05/19 1230 07/05/19 1245 07/05/19 1300  BP: (!) 175/87 (!) 183/87 (!) 178/103 (!) 179/92  Pulse: 87 90 87 88  Resp: (!) '8 14 17 13  ' Temp:      TempSrc:      SpO2: 99% 99% 100% 100%  Weight:      Height:       No intake or output data in the 24 hours ending 07/05/19 1333 Last 3 Weights 07/05/2019 06/24/2019 03/15/2019  Weight (lbs) 231 lb 231 lb 240 lb  Weight (kg) 104.781 kg 104.781 kg 108.863 kg     Body mass index is 39.65 kg/m.   VITAL SIGNS:  reviewed GEN:  no acute distress EYES:  sclerae anicteric, EOMI - Extraocular Movements Intact RESPIRATORY:  speaking in full sentences without increased work of breathing NEURO:  alert and oriented x 3, no obvious focal deficit PSYCH:  normal affect  EKG:  The EKG was personally reviewed and demonstrates:  sinus tachycardia, rate 107 bpm, non-specific ST-T wave abnormalities, no STE/D; no significant change from previous  Relevant CV Studies: Echocardiogram 04/2018: Study Conclusions  - Procedure narrative: Transthoracic echocardiography. Image   quality was poor. The study was technically difficult, as a result of body habitus. Intravenous contrast (Definity) was administered. - Left ventricle: The cavity size was normal. Wall thickness was increased in a pattern of moderate LVH. Systolic function was normal. The estimated ejection fraction was in the range of 60% to 65%. Wall motion was normal; there were no regional wall   motion abnormalities. Doppler parameters are consistent with pseudonormal left ventricular relaxation (grade 2 diastolic dysfunction). The E/e&' ratio is >15, suggesting elevated LV filling pressure. - Left atrium: The atrium was mildly dilated. - Inferior vena cava: The vessel was normal in size. The respirophasic diameter changes were in the normal range (>= 50%), consistent with normal central venous pressure.  Impressions:  -  Technically difficult study. Definity contrast given. LVEF 60-65%, moderate LVH, grossly normal wall motion, grade 2 DD, elevated LV filling pressure, mildly dilated LA, normal IVC.  Left  heart catheterization 2017: 1. Normal coronary arteries. 2. Left ventricular angiography was not performed. EF was normal by echocardiogram recently. 3. Mildly elevated left ventricular end-diastolic pressure with mild gradient across the LVOT.  Recommendations: The chest pain is likely noncardiac. The patient vomited at the end of the case and was given Zofran.  Discharge home tomorrow if no other issues.  Laboratory Data:  Chemistry Recent Labs  Lab 07/01/19 0850 07/05/19 1044  NA 139 140  K 4.0 3.9  CL 109 112*  CO2 20* 20*  GLUCOSE 203* 124*  BUN 25* 20  CREATININE 2.08* 2.25*  CALCIUM 8.3* 8.2*  GFRNONAA 29* 27*  GFRAA 34* 31*  ANIONGAP 10 8    No results for input(s): PROT, ALBUMIN, AST, ALT, ALKPHOS, BILITOT in the last 168 hours. Hematology Recent Labs  Lab 07/01/19 0850 07/05/19 1044  WBC 9.4 5.9  RBC 3.42* 3.32*  HGB 10.0* 9.7*  HCT 30.7* 30.3*  MCV 89.8 91.3  MCH 29.2 29.2  MCHC 32.6 32.0  RDW 12.9 13.0  PLT 370 326   Cardiac EnzymesNo results for input(s): TROPONINI in the last 168 hours. No results for input(s): TROPIPOC in the last 168 hours.  BNP Recent Labs  Lab 07/05/19 1044  BNP 166.5*    DDimer  Recent Labs  Lab 07/05/19 1044  DDIMER 1.24*    Radiology/Studies:  DG Chest Portable 1 View  Result Date: 07/05/2019 CLINICAL DATA:  COVID-19 positive, chest pain EXAM: PORTABLE CHEST 1 VIEW COMPARISON:  06/24/2019 FINDINGS: The heart size and mediastinal contours are within normal limits. Both lungs are clear. The visualized skeletal structures are unremarkable. Tiny focus of mineralization adjacent to the greater tuberosity of the right humeral head suggesting rotator cuff calcific tendinopathy. IMPRESSION: No acute cardiopulmonary findings.  Electronically Signed   By: Davina Poke M.D.   On: 07/05/2019 11:02    '@[email or web address]' @   Assessment and Plan:   1. Chest pain: patient presented with atypical chest pain. HsTrop negative x2. EKG without ischemic changes. Historically she has chest pain when her blood pressure is elevated. Echo in 2019 with EF 60-65%, G1DD, no significant valvular abnormalities. Last ischemic evaluation was a LHC in 2017 which revealed normal coronaries. Unlikely she would have signficant progression of disease since that time, though risk factors include HTN, DM type 2, OSA, and obesity. - Favor aggressive blood pressure management - Echo pending - do not anticipate further ischemic evaluation as long as LV function is stable.    2. Hypertensive urgency: poorly controlled. Home medications continued - Agree with addition of amlodipine - Will start hydralazine 87m TID - Continue carvedilol, losartan, and lasix  3. COVID-19 infection: reportedly positive on outpatient testing 2 days ago. Husband and son also +. Having intermittent fevers. - Continue supportive care per primary team  4. Chronic diastolic CHF: Echo 195/6213with EF 60-65%, G1DD. CXR without acute findings. BNP mildly elevated. No complaints of SOB, LE edema, or PND. Felt to be euvolemic on exam - Continue home carvedilol and lasix  5. DM type 2: poorly controlled. Last A1C 10 in 2019.  - Continue management per primary team  6. CKD stage 3: Cr 2.25; baseline 2.1.  - Continue to monitor     For questions or updates, please contact [place] consult [email or web address] for contact info under     Signed, KAbigail Butts PA-C  07/05/2019 1:33 PM  ---------------------------------------------------------------------------------------------   History and all data above reviewed.  Patient examined via D[employer]imity video  dialer and by telephone.  I agree with the findings as above.  Jacqueline [name] is a 38 year old female  with a past medical history of hypertension, diastolic heart failure, type 2 diabetes, obstructive sleep apnea, morbid obesity status post gastric bypass surgery, CKD stage III who presents today with Covid positive infection and chest pain.  Due to her Covid positive status we participated in appropriate medical distancing and completed a consultation by audiovisual means.  Patient was in agreement with this method.  She notes that over the past several days she has had chest discomfort, fever, and shortness of breath with some lightheadedness.  She has had an angiogram in 2017 which was normal.  She tells me that when her blood pressure is in the 180s she will experience this chest pain.  I have met Jacqueline [name] on a previous hospital admission for similar symptoms.  We determined at that time that blood pressure elevation leads to her chest pain and optimal blood pressure control is an appropriate strategy.  She has had no ischemic events in the interim.  Vital signs reviewed, general no acute distress, cardiovascular no appreciable edema, respiratory no increased work of breathing, no respiratory distress, neuro grossly nonfocal, psych alert and oriented x3, normal mood and affect.  All available labs, radiology testing, previous records reviewed. Agree with documented assessment and plan of my colleague as stated above with the following additions or changes:  Active Problems:   Chest pain    Plan: She has grade 2 diastolic dysfunction on echo, increased LV filling pressures on cath, and severe hypertension.  It seems her hypertension drives her chest pain, and she notes that even when she is home with a blood pressure of 180 she will have chest discomfort similar to what she is presenting with today.  It would be important to control her blood pressure while she recovers from Covid infection.  It will also be important to not overcorrect her blood pressure if she becomes septic from pneumonia or  viral infection.  For the time being we will use hydralazine 25 mg 3 times daily to help control her blood pressure which is significantly elevated at this time.  She is also on a regimen of carvedilol, losartan, and amlodipine.  She also takes Lasix.  We will obtain an echocardiogram and follow virtually for assistance in blood pressure management.  Length of Stay:  LOS: 0 days   Elouise Munroe, MD [facility] 6:58 PM  07/05/2019

## 2019-07-05 NOTE — ED Notes (Signed)
Meal given to pt.

## 2019-07-05 NOTE — H&P (Signed)
History and Physical    [name] [id number] DOB: 1981/03/22 DOA: 07/05/2019  PCP: Audley Hose, MD   Patient coming from: Home  I have personally briefly reviewed patient's old medical records in [place]  Chief Complaint: Chest pain  HPI: Jacqueline [name] is a 38 y.o. female with medical history significant of CKD stage III, hypertension, diabetes mellitus, anxiety, anemia, OSA,morbidobesity, paroxysmal atrial fibrillation not on anticoagulants, chronic anemia,  chronic diastolic CHF, stented with new onset chest pain. Started about 2 days ago, [facility] each episode can last 15 to have an hour, chest pain centrally located pressure-like, worried he initially 3-4 over 10, gradually getting worse, also had one episode last night, noted with short of breath, denied any sweating, palpitations no feeling of nauseous vomiting, also described the pain sometimes goes to her back. She was tested positive for Covid 1 week ago after knowing that both her husband and 9-year-old son were tested positive ([place] record 4 days ago showed she was negative for COVID-19 and flu). Furthermore states that she has had dry cough since last week and subjective fever in the evenings but no chills, no significant short of breath.  ED Course: Her chest pain improved in the ED, EKG no significant CT changes, 4/troponin negative. D-dimer was elevated, ED physician decided to do a VQ scan.  Review of Systems: As per HPI otherwise 10 point review of systems negative.    Past Medical History:  Diagnosis Date  . Anemia   . Anxiety   . Chronic bronchitis ([place])   . Hypertension   . Increased frequency of headaches   . Morbid obesity ([place])   . Sleep apnea   . Type II diabetes mellitus ([place])     Past Surgical History:  Procedure Laterality Date  . BARIATRIC SURGERY    . BREAST REDUCTION SURGERY  03/21/2017  . CARDIAC CATHETERIZATION  01/06/2016  . CARDIAC CATHETERIZATION N/A 01/06/2016   Procedure: Left Heart Cath and Coronary Angiography;  Surgeon: Wellington Hampshire, MD;  Location: [facility];  Service: Cardiovascular;  Laterality: N/A;  . CESAREAN SECTION  08/2014     reports that she has never smoked. She has never used smokeless tobacco. She reports that she does not drink alcohol or use drugs.  No Known Allergies  Family History  Problem Relation Age of Onset  . Diabetes Mother   . Hypertension Mother   . Thyroid disease Mother   . Kidney disease Maternal Grandmother   . Diabetes Maternal Grandmother   . Heart attack Other      Prior to Admission medications   Medication Sig Start Date End Date Taking? Authorizing Provider  acetaminophen (TYLENOL) 500 MG tablet Take 1,000 mg by mouth every 6 (six) hours as needed for mild pain or moderate pain.    Yes [provider]  atorvastatin (LIPITOR) 10 MG tablet Take 10 mg by mouth daily.  03/08/18  Yes [provider]  bumetanide (BUMEX) 1 MG tablet Take 1 mg by mouth daily. 05/30/19  Yes [provider]  carvedilol (COREG) 25 MG tablet Take 1 tablet (25 mg total) by mouth 2 (two) times daily with a meal. 05/06/18  Yes Lady Deutscher, MD  DULoxetine (CYMBALTA) 30 MG capsule Take 30 mg by mouth every morning. 05/21/19  Yes [provider]  furosemide (LASIX) 40 MG tablet Take 40 mg by mouth daily. 06/23/19  Yes [provider]  losartan (COZAAR) 25 MG tablet Take 25  mg by mouth daily.   Yes [provider]  sertraline (ZOLOFT) 100 MG tablet Take 100 mg by mouth at bedtime. 04/30/19  Yes [provider]  cephALEXin (KEFLEX) 500 MG capsule Take 1 capsule (500 mg total) by mouth 2 (two) times daily. Patient not taking: Reported on 07/05/2019 06/24/19   Frederica Kuster, PA-C  cyclobenzaprine (FLEXERIL) 10 MG tablet Take 1 tablet (10 mg total) by mouth 2 (two) times daily as needed for muscle spasms. Patient not taking: Reported on 07/05/2019 03/15/19    Delia Heady, PA-C  metoCLOPramide (REGLAN) 10 MG tablet Take 1 tablet (10 mg total) by mouth every 6 (six) hours. Patient not taking: Reported on 07/05/2019 06/24/19   Frederica Kuster, PA-C  Vitamin D, Ergocalciferol, (DRISDOL) 1.25 MG (50000 UT) CAPS capsule Take 50,000 Units by mouth once a week. Thursday 09/03/18   [provider]    Physical Exam: Vitals:   07/05/19 1145 07/05/19 1200 07/05/19 1215 07/05/19 1230  BP: (!) 176/94 (!) 167/87 (!) 175/87 (!) 183/87  Pulse: 87 85 87 90  Resp: 11 12 (!) 8 14  Temp:      TempSrc:      SpO2: 100% 99% 99% 99%  Weight:      Height:        Constitutional: NAD, calm, comfortable Vitals:   07/05/19 1145 07/05/19 1200 07/05/19 1215 07/05/19 1230  BP: (!) 176/94 (!) 167/87 (!) 175/87 (!) 183/87  Pulse: 87 85 87 90  Resp: 11 12 (!) 8 14  Temp:      TempSrc:      SpO2: 100% 99% 99% 99%  Weight:      Height:       Eyes: PERRL, lids and conjunctivae normal ENMT: Mucous membranes are moist. Posterior pharynx clear of any exudate or lesions.Normal dentition.  Neck: normal, supple, no masses, no thyromegaly Respiratory: clear to auscultation bilaterally, no wheezing, no crackles. Normal respiratory effort. No accessory muscle use.  Cardiovascular: Regular rate and rhythm, no murmurs / rubs / gallops. No extremity edema. 2+ pedal pulses. No carotid bruits. Bilateral radial pulse equal. Abdomen: no tenderness, no masses palpated. No hepatosplenomegaly. Bowel sounds positive.  Musculoskeletal: no clubbing / cyanosis. No joint deformity upper and lower extremities. Good ROM, no contractures. Normal muscle tone.  Skin: no rashes, lesions, ulcers. No induration Neurologic: CN 2-12 grossly intact. Sensation intact, DTR normal. Strength 5/5 in all 4.  Psychiatric: Looks very nervous ,normal judgment and insight. Alert and oriented x 3. Normal mood.     Labs on Admission: I have personally reviewed following labs and imaging  studies  CBC: Recent Labs  Lab 07/01/19 0850 07/05/19 1044  WBC 9.4 5.9  NEUTROABS 6.1 3.5  HGB 10.0* 9.7*  HCT 30.7* 30.3*  MCV 89.8 91.3  PLT 370 [id number]   Basic Metabolic Panel: Recent Labs  Lab 07/01/19 0850 07/05/19 1044  NA 139 140  K 4.0 3.9  CL 109 112*  CO2 20* 20*  GLUCOSE 203* 124*  BUN 25* 20  CREATININE 2.08* 2.25*  CALCIUM 8.3* 8.2*   GFR: Estimated Creatinine Clearance: 39.6 mL/min (A) (by C-G formula based on SCr of 2.25 mg/dL (H)). Liver Function Tests: No results for input(s): AST, ALT, ALKPHOS, BILITOT, PROT, ALBUMIN in the last 168 hours. No results for input(s): LIPASE, AMYLASE in the last 168 hours. No results for input(s): AMMONIA in the last 168 hours. Coagulation Profile: No results for input(s): INR, PROTIME in the last 168 hours.  Cardiac Enzymes: No results for input(s): CKTOTAL, CKMB, CKMBINDEX, TROPONINI in the last 168 hours. BNP (last 3 results) No results for input(s): PROBNP in the last 8760 hours. HbA1C: No results for input(s): HGBA1C in the last 72 hours. CBG: No results for input(s): GLUCAP in the last 168 hours. Lipid Profile: No results for input(s): CHOL, HDL, LDLCALC, TRIG, CHOLHDL, LDLDIRECT in the last 72 hours. Thyroid Function Tests: No results for input(s): TSH, T4TOTAL, FREET4, T3FREE, THYROIDAB in the last 72 hours. Anemia Panel: No results for input(s): VITAMINB12, FOLATE, FERRITIN, TIBC, IRON, RETICCTPCT in the last 72 hours. Urine analysis:    Component Value Date/Time   COLORURINE YELLOW 07/01/2019 0850   APPEARANCEUR HAZY (A) 07/01/2019 0850   LABSPEC 1.019 07/01/2019 0850   PHURINE 5.0 07/01/2019 0850   GLUCOSEU 150 (A) 07/01/2019 0850   HGBUR NEGATIVE 07/01/2019 0850   BILIRUBINUR NEGATIVE 07/01/2019 0850   KETONESUR NEGATIVE 07/01/2019 0850   PROTEINUR 100 (A) 07/01/2019 0850   UROBILINOGEN 0.2 08/21/2012 2130   NITRITE NEGATIVE 07/01/2019 0850   LEUKOCYTESUR NEGATIVE 07/01/2019 0850    Radiological  Exams on Admission: DG Chest Portable 1 View  Result Date: 07/05/2019 CLINICAL DATA:  COVID-19 positive, chest pain EXAM: PORTABLE CHEST 1 VIEW COMPARISON:  06/24/2019 FINDINGS: The heart size and mediastinal contours are within normal limits. Both lungs are clear. The visualized skeletal structures are unremarkable. Tiny focus of mineralization adjacent to the greater tuberosity of the right humeral head suggesting rotator cuff calcific tendinopathy. IMPRESSION: No acute cardiopulmonary findings. Electronically Signed   By: Davina Poke M.D.   On: 07/05/2019 11:02    EKG: Independently reviewed.   Assessment/Plan Active Problems:   Chest pain  Atypical chest pain rule out ACS, also need to rule out aortic dissection given her significant elevation of blood pressure. A aortic ultrasound was ordered. She has negative cath in 2017, I ordered a echocardiogram, follow second set of troponins. Consult cardio for possible stress test. Her blood pressure needs to be better controlled, I added amlodipine five for now can be titrated up.  COVID-19, hypoxia no objective fever, do not see an indication for further treatment.  Uncontrolled hypertension, continue Lasix Bumex, start time and Coreg, added amlodipine.  CKD stage III, Cre stable, continue Lasix Bumex and losartan  Diabetes, much improved after bariatric surgery February 2020, and has a 65 to 70 pound weight loss since, and she has been off insulin for few months and her sugar has been fairly controlled any without medication.  History of paroxysmal A. Fib, remained sinus rhythm on recent EKGs.  Chronic diastolic CHF, elevation of BNP likely from her CKD, currently no signs of decompensation, continue current diuresis regimen.  Chronic anemia, related to menorrhagia likely, OB/GYN follow-up.  OSA, continue CPAP at bedtime, PEEP of 15.  Hyperlipidemia, continue statin.  DVT prophylaxis: Subcu heparin Code Status: Full code   family Communication: None at bedside  disposition Plan: Home Consults called: [facility] discussed over phone. Admission status: Telemetry observation   Lequita Halt MD [facility] Pager [phone]  If 7PM-7AM, please contact night-coverage [email or web address] Password [facility]  07/05/2019, 12:44 PM

## 2019-07-05 NOTE — ED Notes (Signed)
Ordered a hospital bed per rn Phil--Emmilia Sowder

## 2019-07-06 DIAGNOSIS — I160 Hypertensive urgency: Secondary | ICD-10-CM

## 2019-07-06 DIAGNOSIS — R079 Chest pain, unspecified: Secondary | ICD-10-CM | POA: Diagnosis not present

## 2019-07-06 LAB — LIPID PANEL
Cholesterol: 130 mg/dL (ref 0–200)
HDL: 29 mg/dL — ABNORMAL LOW (ref >40)
LDL Cholesterol: 68 mg/dL (ref 0–99)
Total CHOL/HDL Ratio: 4.5 RATIO
Triglycerides: 164 mg/dL — ABNORMAL HIGH (ref <150)
VLDL: 33 mg/dL (ref 0–40)

## 2019-07-06 LAB — C-REACTIVE PROTEIN: CRP: 1 mg/dL — ABNORMAL HIGH (ref <1.0)

## 2019-07-06 LAB — HIV ANTIBODY (ROUTINE TESTING W REFLEX): HIV Screen 4th Generation wRfx: NONREACTIVE

## 2019-07-06 LAB — PROCALCITONIN: Procalcitonin: <0.1 ng/mL

## 2019-07-06 LAB — FERRITIN: Ferritin: 117 ng/mL (ref 11–307)

## 2019-07-06 MED ORDER — HYDRALAZINE HCL 50 MG PO TABS
50.0000 mg | ORAL_TABLET | Freq: Three times a day (TID) | ORAL | Status: DC
  Administered 2019-07-06 – 2019-07-07 (×4): 50 mg via ORAL
  Filled 2019-07-06: qty 1
  Filled 2019-07-06: qty 2
  Filled 2019-07-06: qty 1
  Filled 2019-07-06: qty 2

## 2019-07-06 NOTE — Progress Notes (Signed)
Progress Note  Due to the COVID-19 pandemic, this visit was completed with telemedicine (audio/video) technology to reduce patient and provider exposure as well as to preserve personal protective equipment.   Patient Name: Jacqueline [name] Date of Encounter: 07/06/2019  Primary Cardiologist: Minus Breeding, MD   Subjective   Still hypertensive today, however improvement seen while sleeping overnight, SBP 120-130 overnight.  Spoke to her by phone today and she states that she is feeling well and does not have any chest pain.  I also spoke to her husband who is home and Covid positive as well.  Their son also is Covid positive but their daughter tested negative.  I answered all family questions to the best my ability.  Inpatient Medications    Scheduled Meds: . amLODipine  5 mg Oral Daily  . aspirin EC  81 mg Oral Daily  . atorvastatin  10 mg Oral Daily  . carvedilol  25 mg Oral BID WC  . DULoxetine  30 mg Oral q morning - 10a  . furosemide  40 mg Oral Daily  . heparin  5,000 Units Subcutaneous Q8H  . hydrALAZINE  25 mg Oral TID  . losartan  25 mg Oral Daily  . sertraline  100 mg Oral QHS   Continuous Infusions:  PRN Meds: acetaminophen, nitroGLYCERIN, ondansetron (ZOFRAN) IV   Vital Signs    Vitals:   07/05/19 2330 07/06/19 0100 07/06/19 0245 07/06/19 0535  BP: (!) 162/88 136/73 129/80 (!) 171/87  Pulse: 97 85 84 82  Resp: 18 18 19  (!) 21  Temp:      TempSrc:      SpO2: 99% 98% 99% 97%  Weight:      Height:       No intake or output data in the 24 hours ending 07/06/19 0803 [name] 3 Weights 07/05/2019 06/24/2019 03/15/2019  Weight (lbs) 231 lb 231 lb 240 lb  Weight (kg) 104.781 kg 104.781 kg 108.863 kg       Telemetry    NSR - Personally Reviewed  ECG    No new - Personally Reviewed  Physical Exam   VITAL SIGNS:  reviewed GEN:  no acute distress  Labs    Chemistry Recent Labs  Lab 07/01/19 0850 07/05/19 1044  NA 139 140  K 4.0 3.9  CL 109 112*  CO2  20* 20*  GLUCOSE 203* 124*  BUN 25* 20  CREATININE 2.08* 2.25*  CALCIUM 8.3* 8.2*  GFRNONAA 29* 27*  GFRAA 34* 31*  ANIONGAP 10 8     Hematology Recent Labs  Lab 07/01/19 0850 07/05/19 1044  WBC 9.4 5.9  RBC 3.42* 3.32*  HGB 10.0* 9.7*  HCT 30.7* 30.3*  MCV 89.8 91.3  MCH 29.2 29.2  MCHC 32.6 32.0  RDW 12.9 13.0  PLT 370 326    Cardiac EnzymesNo results for input(s): TROPONINI in the [name] 168 hours. No results for input(s): TROPIPOC in the [name] 168 hours.   BNP Recent Labs  Lab 07/05/19 1044  BNP 166.5*     DDimer  Recent Labs  Lab 07/05/19 1044  DDIMER 1.24*     Radiology    NM Pulmonary Perfusion  Result Date: 07/05/2019 CLINICAL DATA:  Chest pain.  COVID-19 positive EXAM: NUCLEAR MEDICINE PERFUSION LUNG SCAN TECHNIQUE: Perfusion images were obtained in multiple projections after intravenous injection of radiopharmaceutical. Ventilation scans intentionally deferred if perfusion scan and chest x-ray adequate for interpretation during COVID 19 epidemic. Views: Anterior, posterior, left lateral, right lateral, RPO, LPO,  RAO, LAO RADIOPHARMACEUTICALS:  1.55 mCi Tc-34m MAA IV COMPARISON:  Chest radiograph July 05, 2019 FINDINGS: Radiotracer uptake is homogeneous and symmetric bilaterally. There are no perfusion defects. IMPRESSION: No evident perfusion defects. Very low probability of pulmonary embolus. Electronically Signed   By: Lowella Grip III M.D.   On: 07/05/2019 17:09   US AORTA  Result Date: 07/05/2019 CLINICAL DATA:  Abdominal/back pain.  Hypertension EXAM: ULTRASOUND OF ABDOMINAL AORTA TECHNIQUE: Ultrasound examination of the abdominal aorta and proximal common iliac arteries was performed to evaluate for aneurysm. Additional color and Doppler images of the distal aorta were obtained to document patency. COMPARISON:  None. FINDINGS: Abdominal aortic measurements as follows: Proximal:  2.6 x 2.3 cm Mid:  2.1 x 1.8 cm Distal:  1.9 x 1.8 cm Patent:  Yes, peak systolic velocity is [id number] cm/s Common iliac arteries obscured by gas. No periaortic fluid or adenopathy. IMPRESSION: No abdominal aortic aneurysm. No periaortic fluid. Maximum diameter of the aorta is seen proximally at 2.6 x 2.3 cm. Ectatic abdominal aorta at risk for aneurysm development. Recommend followup by ultrasound in 5 years. This recommendation follows ACR consensus guidelines: White Paper of the ACR Incidental Findings Committee II on Vascular Findings. J Am Coll Radiol 2013; 10:789-794. Electronically Signed   By: Lowella Grip III M.D.   On: 07/05/2019 14:26   DG Chest Portable 1 View  Result Date: 07/05/2019 CLINICAL DATA:  COVID-19 positive, chest pain EXAM: PORTABLE CHEST 1 VIEW COMPARISON:  06/24/2019 FINDINGS: The heart size and mediastinal contours are within normal limits. Both lungs are clear. The visualized skeletal structures are unremarkable. Tiny focus of mineralization adjacent to the greater tuberosity of the right humeral head suggesting rotator cuff calcific tendinopathy. IMPRESSION: No acute cardiopulmonary findings. Electronically Signed   By: Davina Poke M.D.   On: 07/05/2019 11:02   ECHOCARDIOGRAM COMPLETE  Result Date: 07/05/2019   ECHOCARDIOGRAM REPORT   Patient Name:   Jacqueline [name] Date of Exam: 07/05/2019 Medical Rec #:  [id number]    Height: Accession #:    [id number]   Weight: Date of Birth:  [birth year]    BSA: Patient Age:    38 years     BP:           189/96 mmHg Patient Gender: F            HR:           94 bpm. Exam Location:  Inpatient Procedure: 2D Echo Indications:    Chest Pain 786.50 / R07.9  History:        Patient has prior history of Echocardiogram examinations, most                 recent 05/04/2018. Risk Factors:Diabetes, Dyslipidemia and                 Hypertension. Chronic kidney disease                 Sepsis.  Sonographer:    Vikki Ports Turrentine Referring Phys: [id number] [place]  1. Left ventricular ejection fraction,  by visual estimation, is 60 to 65%. The left ventricle has normal function. Left ventricular septal wall thickness was moderately increased. Moderately increased left ventricular posterior wall thickness. There is severely increased left ventricular hypertrophy of the basal septum.  2. The left ventricle has no regional wall motion abnormalities.  3. Left ventricular diastolic parameters are consistent with Grade II diastolic dysfunction (pseudonormalization).  4. Elevated left ventricular end-diastolic pressure.  5. Global right ventricle has normal systolic function.The right ventricular size is normal. No increase in right ventricular wall thickness.  6. Left atrial size was mildly dilated.  7. Right atrial size was normal.  8. The mitral valve is normal in structure. No evidence of mitral valve regurgitation. No evidence of mitral stenosis.  9. The tricuspid valve is normal in structure. Tricuspid valve regurgitation is not demonstrated. 10. The aortic valve is normal in structure. Aortic valve regurgitation is not visualized. No evidence of aortic valve sclerosis or stenosis. 11. The pulmonic valve was normal in structure. Pulmonic valve regurgitation is not visualized. 12. The inferior vena cava is normal in size with greater than 50% respiratory variability, suggesting right atrial pressure of 3 mmHg. FINDINGS  Left Ventricle: Left ventricular ejection fraction, by visual estimation, is 60 to 65%. The left ventricle has normal function. The left ventricle has no regional wall motion abnormalities. Moderately increased left ventricular posterior wall thickness.  There is severely increased left ventricular hypertrophy. Left ventricular diastolic parameters are consistent with Grade II diastolic dysfunction (pseudonormalization). Elevated left ventricular end-diastolic pressure. Right Ventricle: The right ventricular size is normal. No increase in right ventricular wall thickness. Global RV systolic function is  has normal systolic function. Left Atrium: Left atrial size was mildly dilated. Right Atrium: Right atrial size was normal in size Pericardium: There is no evidence of pericardial effusion. Mitral Valve: The mitral valve is normal in structure. No evidence of mitral valve regurgitation. No evidence of mitral valve stenosis by observation. Tricuspid Valve: The tricuspid valve is normal in structure. Tricuspid valve regurgitation is not demonstrated. Aortic Valve: The aortic valve is normal in structure. Aortic valve regurgitation is not visualized. The aortic valve is structurally normal, with no evidence of sclerosis or stenosis. Pulmonic Valve: The pulmonic valve was normal in structure. Pulmonic valve regurgitation is not visualized. Pulmonic regurgitation is not visualized. Aorta: The aortic root, ascending aorta and aortic arch are all structurally normal, with no evidence of dilitation or obstruction. Venous: The inferior vena cava is normal in size with greater than 50% respiratory variability, suggesting right atrial pressure of 3 mmHg. IAS/Shunts: No atrial level shunt detected by color flow Doppler. There is no evidence of a patent foramen ovale. No ventricular septal defect is seen or detected. There is no evidence of an atrial septal defect.  Fransico Him MD Electronically signed by Fransico Him MD Signature Date/Time: 07/05/2019/3:50:18 PM    Final     Cardiac Studies  Echo 07/05/2019 1. Left ventricular ejection fraction, by visual estimation, is 60 to 65%. The left ventricle has normal function. Left ventricular septal wall thickness was moderately increased. Moderately increased left ventricular posterior wall thickness. There is  severely increased left ventricular hypertrophy of the basal septum.  2. The left ventricle has no regional wall motion abnormalities.  3. Left ventricular diastolic parameters are consistent with Grade II diastolic dysfunction (pseudonormalization).  4. Elevated  left ventricular end-diastolic pressure.  5. Global right ventricle has normal systolic function.The right ventricular size is normal. No increase in right ventricular wall thickness.  6. Left atrial size was mildly dilated.  7. Right atrial size was normal.  8. The mitral valve is normal in structure. No evidence of mitral valve regurgitation. No evidence of mitral stenosis.  9. The tricuspid valve is normal in structure. Tricuspid valve regurgitation is not demonstrated. 10. The aortic valve is normal in structure. Aortic valve regurgitation is not visualized. No evidence of aortic valve sclerosis  or stenosis. 11. The pulmonic valve was normal in structure. Pulmonic valve regurgitation is not visualized. 12. The inferior vena cava is normal in size with greater than 50% respiratory variability, suggesting right atrial pressure of 3 mmHg.  Patient Profile     Marialuisa Clercyis a 38 y.o.femalewith a PMH of HTN, chronic diastolic CHF, DM type 2, OSA, morbid obesity s/p gastric bypass, and CKD stage 3who is being seen today for the evaluation of chest painat the request of Dr. Roosevelt Locks.  Assessment & Plan    Active Problems:   Chest pain  Chest pain-she is chest pain-free today.  Her blood pressure remains elevated.  Echocardiogram is normal.  Previous coronary angiogram in 2017 was without obstructive coronary artery disease.  She has elevated LVEDP.  Hypertension-this seems to be the driver of her chest pain.  Overnight she had improved blood pressures with systolics in the [id number] and 130s.  We had initiated hydralazine yesterday.  Her blood pressure is elevated again today in the [id number] systolic.  She feels better today and has had an improvement in her breathing.  I will uptitrate her hydralazine today.  We discussed that this may not be the optimal long-term strategy, but I do believe control of hypertension will be the key to managing her chest pain with previously normal ischemic evaluation. If  she is still hypertensive tomorrow, we can consider increasing the amlodipine from 5 mg to 10 mg daily.  Can continue other home cardiovascular medications while hemodynamically stable.  Cardiology will follow along.     For questions or updates, please contact [place] Please consult [email or web address] for contact info under        Signed, Elouise Munroe, MD  07/06/2019, 8:03 AM

## 2019-07-06 NOTE — ED Triage Notes (Signed)
Chef salad ordered for lunch

## 2019-07-06 NOTE — ED Notes (Signed)
Dinner tray ordered.

## 2019-07-06 NOTE — ED Notes (Signed)
Attempted to give report RN for [phone]. RN unable to take report at this time. Informed handoff report documented in chart. RN to return call 5-10 minutes

## 2019-07-06 NOTE — ED Notes (Signed)
Breakfast ordered 

## 2019-07-06 NOTE — Progress Notes (Signed)
Hospitalist daily note   Jacqueline [name] [id number] DOB: 1980-08-20 DOA: 07/05/2019  PCP: Audley Hose, MD   Narrative:  49 fem HTN, DM TY ii A1c 10, OSA/CPAP, CKD iii, paroxysmal A. 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 Rx UTI in the emergency room 06/24/2019 with nausea vomiting cough headache chills-diagnosed with hemiplegic migraine and Rx for UTI-neg POC test for  coronavirus 12/7  Return 12/14 persistence of symptoms-instructed to return if anything worsens Came to ED 12/18 new onset chest pain centralized retested 12/18 found to be positive-other symptoms include dry cough subjective fevers BUNs/creatinine up from baseline 18/1.8 Data Reviewed:  BUN/creatinine up from 18/1.8---->20/2.2 Chloride 112 Hemoglobin 9.7) baseline white count normal CXR 1 view no acute findings Aortic ultrasound 2.662.3 with mild ectasis and needs surveillance in 5 years VQ scan chest showed no evidence of pulmonary embolism Assessment & Plan: Coronavirus positive Biomarkers pending not a candidate for remdesivir steroids at this time No oxygen requirement at this time Chest pain Sounds pleuritic-VQ scan chest x-ray negative Symptomatic management no oxygen requirement monitor Cardiology recommends hydralazine 25 iii/day follow-up echocardiogram done 12/18 Super morbid obesity/OSA Monitor trends needs outpatient further weight loss management Not using anymore Has lost 73lbs and congratulated Paroxysmal A. fib Continue Coreg 25 twice daily, losartan 25 daily, amlodipine 5 HFpEF Continue Lasix 40 daily DM TY 2 on control  Status post Roux-en-Y 08/2018    Subjective: Mild cough no n/v No fever right now No diarrhea  Consultants:   cards Procedures:    Objective: Vitals:   07/05/19 2330 07/06/19 0100 07/06/19 0245 07/06/19 0535  BP: (!) 162/88 136/73 129/80 (!) 171/87  Pulse: 97 85 84 82  Resp: 18 18 19  (!) 21  Temp:      TempSrc:      SpO2: 99% 98% 99% 97%  Weight:       Height:       No intake or output data in the 24 hours ending 07/06/19 0750 Filed Weights   07/05/19 0933  Weight: 104.8 kg    Examination: eomim ncat no focal deficit  Thick neck Perrla ncat cta b no rales no rhonchi no wheeze abd soft nt nd obese No l;e edema  Scheduled Meds: . amLODipine  5 mg Oral Daily  . aspirin EC  81 mg Oral Daily  . atorvastatin  10 mg Oral Daily  . carvedilol  25 mg Oral BID WC  . DULoxetine  30 mg Oral q morning - 10a  . furosemide  40 mg Oral Daily  . heparin  5,000 Units Subcutaneous Q8H  . hydrALAZINE  25 mg Oral TID  . losartan  25 mg Oral Daily  . sertraline  100 mg Oral QHS   Continuous Infusions:   LOS: 0 days   Time spent: [place], MD [facility]

## 2019-07-06 NOTE — ED Notes (Signed)
ED TO INPATIENT HANDOFF REPORT  ED Nurse Name and Phone #: [phone]  S Name/Age/Gender Jacqueline [name] 38 y.o. female Room/Bed: 011C/011C  Code Status   Code Status: Full Code  Home/SNF/Other Home Patient oriented to: self, place, time and situation Is this baseline? Yes   Triage Complete: Triage complete  Chief Complaint Chest pain [R07.9]  Triage Note Patient states she tested positive earlier this week along with her whole family. Onset of midnight last night centra chest pain along with shortness of breath when coughing. Central chest pain radiating into back, describes it as aching.   Chef salad ordered for lunch    Allergies No Known Allergies  Level of Care/Admitting Diagnosis ED Disposition    ED Disposition Condition [facility] Area: [place] [[id number]]  Level of Care: Telemetry Cardiac [103]  Covid Evaluation: Confirmed COVID Positive  Diagnosis: Chest pain [660630]  Admitting Physician: Lequita Halt [[id number]]  Attending Physician: Nita Sells [phone]  Estimated length of stay: past midnight tomorrow  Certification:: I certify this patient will need inpatient services for at least 2 midnights       B Medical/Surgery History Past Medical History:  Diagnosis Date  . Anemia   . Anxiety   . Chronic bronchitis ([place])   . Hypertension   . Increased frequency of headaches   . Morbid obesity ([place])   . Sleep apnea   . Type II diabetes mellitus ([place])    Past Surgical History:  Procedure Laterality Date  . BARIATRIC SURGERY    . BREAST REDUCTION SURGERY  03/21/2017  . CARDIAC CATHETERIZATION  01/06/2016  . CARDIAC CATHETERIZATION N/A 01/06/2016   Procedure: Left Heart Cath and Coronary Angiography;  Surgeon: Wellington Hampshire, MD;  Location: [place] [facility];  Service: Cardiovascular;  Laterality: N/A;  . CESAREAN SECTION  08/2014     A IV Location/Drains/Wounds Patient Lines/Drains/Airways Status   Active  Line/Drains/Airways    Name:   Placement date:   Placement time:   Site:   Days:   Peripheral IV 07/05/19 Left Antecubital   07/05/19    1052    Antecubital   1          Intake/Output Last 24 hours No intake or output data in the 24 hours ending 07/06/19 2011  Labs/Imaging Results for orders placed or performed during the hospital encounter of 07/05/19 (from the past 48 hour(s))  Basic metabolic panel     Status: Abnormal   Collection Time: 07/05/19 10:44 AM  Result Value Ref Range   Sodium 140 135 - 145 mmol/L   Potassium 3.9 3.5 - 5.1 mmol/L   Chloride 112 (H) 98 - 111 mmol/L   CO2 20 (L) 22 - 32 mmol/L   Glucose, Bld 124 (H) 70 - 99 mg/dL   BUN 20 6 - 20 mg/dL   Creatinine, Ser 2.25 (H) 0.44 - 1.00 mg/dL   Calcium 8.2 (L) 8.9 - 10.3 mg/dL   GFR calc non Af Amer 27 (L) >60 mL/min   GFR calc Af Amer 31 (L) >60 mL/min   Anion gap 8 5 - 15    Comment: Performed at [facility], [address], [place], [place] [address]  Brain natriuretic peptide     Status: Abnormal   Collection Time: 07/05/19 10:44 AM  Result Value Ref Range   B Natriuretic Peptide 166.5 (H) 0.0 - 100.0 pg/mL    Comment: Performed at [facility], [address] [place], [place] [address]  Troponin I (High Sensitivity)     Status: None   Collection Time: 07/05/19 10:44 AM  Result Value Ref Range   Troponin I (High Sensitivity) 7 <18 ng/L    Comment: (NOTE) Elevated high sensitivity troponin I (hsTnI) values and significant  changes across serial measurements may suggest ACS but many other  chronic and acute conditions are known to elevate hsTnI results.  Refer to the "Links" section for chest pain algorithms and additional  guidance. Performed at [facility], [place] [address] [place], [place] [address]   CBC with Differential     Status: Abnormal   Collection Time: 07/05/19 10:44 AM  Result Value Ref Range   WBC 5.9 4.0 - 10.5 K/uL   RBC 3.32 (L) 3.87 - 5.11 MIL/uL   Hemoglobin  9.7 (L) 12.0 - 15.0 g/dL   HCT 30.3 (L) 36.0 - 46.0 %   MCV 91.3 80.0 - 100.0 fL   MCH 29.2 26.0 - 34.0 pg   MCHC 32.0 30.0 - 36.0 g/dL   RDW 13.0 11.5 - 15.5 %   Platelets 326 150 - 400 K/uL   nRBC 0.0 0.0 - 0.2 %   Neutrophils Relative % 58 %   Neutro Abs 3.5 1.7 - 7.7 K/uL   Lymphocytes Relative 28 %   Lymphs Abs 1.6 0.7 - 4.0 K/uL   Monocytes Relative 9 %   Monocytes Absolute 0.6 0.1 - 1.0 K/uL   Eosinophils Relative 3 %   Eosinophils Absolute 0.2 0.0 - 0.5 K/uL   Basophils Relative 1 %   Basophils Absolute 0.0 0.0 - 0.1 K/uL   Immature Granulocytes 1 %   Abs Immature Granulocytes 0.03 0.00 - 0.07 K/uL    Comment: Performed at [facility] [address] [place], [place] [address]  D-dimer, quantitative     Status: Abnormal   Collection Time: 07/05/19 10:44 AM  Result Value Ref Range   D-Dimer, Quant 1.24 (H) 0.00 - 0.50 ug/mL-FEU    Comment: (NOTE) At the manufacturer cut-off of 0.50 ug/mL FEU, this assay has been documented to exclude PE with a sensitivity and negative predictive value of 97 to 99%.  At this time, this assay has not been approved by the FDA to exclude DVT/VTE. Results should be correlated with clinical presentation. Performed at [facility], [place] [address] [place], [place] [address]   I-Stat beta hCG blood, ED     Status: None   Collection Time: 07/05/19 11:01 AM  Result Value Ref Range   I-stat hCG, quantitative <5.0 <5 mIU/mL   Comment 3            Comment:   GEST. AGE      CONC.  (mIU/mL)   <=1 WEEK        5 - 50     2 WEEKS       50 - 500     3 WEEKS       100 - 10,000     4 WEEKS     1,000 - 30,000        FEMALE AND NON-PREGNANT FEMALE:     LESS THAN 5 mIU/mL   Troponin I (High Sensitivity)     Status: None   Collection Time: 07/05/19 12:35 PM  Result Value Ref Range   Troponin I (High Sensitivity) 7 <18 ng/L    Comment: (NOTE) Elevated high sensitivity troponin I (hsTnI) values and significant  changes across serial  measurements may suggest ACS but many other  chronic and acute conditions are known to elevate hsTnI results.  Refer to the "Links" section for chest pain algorithms and additional  guidance. Performed at [facility], [place] [address] [place], [place] [address]   POC SARS Coronavirus 2 Ag-ED - Nasal Swab (BD Veritor Kit)     Status: Abnormal   Collection Time: 07/05/19  1:05 PM  Result Value Ref Range   SARS Coronavirus 2 Ag POSITIVE (A) NEGATIVE    Comment: (NOTE) SARS-CoV-2 antigen PRESENT. Positive results indicate the presence of viral antigens, but clinical correlation with patient history and other diagnostic information is necessary to determine patient infection status.  Positive results do not rule out bacterial infection or co-infection  with other viruses. False positive results are rare but can occur, and confirmatory RT-PCR testing may be appropriate in some circumstances. The expected result is Negative. Fact Sheet for Patients: PodPark.tn Fact Sheet for Providers: GiftContent.is  This test is not yet approved or cleared by the [place] FDA and  has been authorized for detection and/or diagnosis of SARS-CoV-2 by FDA under an Emergency Use Authorization (EUA).  This EUA will remain in effect (meaning this test can be used) for the duration of  the COVID-19 declaration under Section 564(b)(1) of the Act, 21 U.S.C. section 360bbb-3(b)(1), unless the a uthorization is terminated or revoked sooner.   HIV Antibody (routine testing w rflx)     Status: None   Collection Time: 07/06/19  2:19 AM  Result Value Ref Range   HIV Screen 4th Generation wRfx NON REACTIVE NON REACTIVE    Comment: Performed at [facility], [address] [place], [place] [address]  Lipid panel     Status: Abnormal   Collection Time: 07/06/19  4:04 AM  Result Value Ref Range   Cholesterol 130 0 - 200 mg/dL   Triglycerides  164 (H) <150 mg/dL   HDL 29 (L) >40 mg/dL   Total CHOL/HDL Ratio 4.5 RATIO   VLDL 33 0 - 40 mg/dL   LDL Cholesterol 68 0 - 99 mg/dL    Comment:        Total Cholesterol/HDL:CHD Risk Coronary Heart Disease Risk Table                     Men   Women  1/2 Average Risk   3.4   3.3  Average Risk       5.0   4.4  2 X Average Risk   9.6   7.1  3 X Average Risk  23.4   11.0        Use the calculated Patient Ratio above and the CHD Risk Table to determine the patient's CHD Risk.        ATP III CLASSIFICATION (LDL):  <100     mg/dL   Optimal  100-129  mg/dL   Near or Above                    Optimal  130-159  mg/dL   Borderline  160-189  mg/dL   High  >190     mg/dL   Very High Performed at [facility] [address], [place],  [address]   C-reactive protein     Status: Abnormal   Collection Time: 07/06/19  4:04 AM  Result Value Ref Range   CRP 1.0 (H) <1.0 mg/dL  Comment: Performed at [facility], [place] [address] [place], [place] [address]  Ferritin     Status: None   Collection Time: 07/06/19  4:04 AM  Result Value Ref Range   Ferritin 117 11 - 307 ng/mL    Comment: Performed at [facility], [place] [address], [place], [place] [address]  Procalcitonin     Status: None   Collection Time: 07/06/19  4:04 AM  Result Value Ref Range   Procalcitonin <0.10 ng/mL    Comment:        Interpretation: PCT (Procalcitonin) <= 0.5 ng/mL: Systemic infection (sepsis) is not likely. Local bacterial infection is possible. (NOTE)       Sepsis PCT Algorithm           Lower Respiratory Tract                                      Infection PCT Algorithm    ----------------------------     ----------------------------         PCT < 0.25 ng/mL                PCT < 0.10 ng/mL         Strongly encourage             Strongly discourage   discontinuation of antibiotics    initiation of antibiotics    ----------------------------     -----------------------------       PCT  0.25 - 0.50 ng/mL            PCT 0.10 - 0.25 ng/mL               OR       >80% decrease in PCT            Discourage initiation of                                            antibiotics      Encourage discontinuation           of antibiotics    ----------------------------     -----------------------------         PCT >= 0.50 ng/mL              PCT 0.26 - 0.50 ng/mL               AND        <80% decrease in PCT             Encourage initiation of                                             antibiotics       Encourage continuation           of antibiotics    ----------------------------     -----------------------------        PCT >= 0.50 ng/mL                  PCT > 0.50 ng/mL               AND  increase in PCT                  Strongly encourage                                      initiation of antibiotics    Strongly encourage escalation           of antibiotics                                     -----------------------------                                           PCT <= 0.25 ng/mL                                                 OR                                        > 80% decrease in PCT                                     Discontinue / Do not initiate                                             antibiotics Performed at [facility], [address], [place], [place] [address]    NM Pulmonary Perfusion  Result Date: 07/05/2019 CLINICAL DATA:  Chest pain.  COVID-19 positive EXAM: NUCLEAR MEDICINE PERFUSION LUNG SCAN TECHNIQUE: Perfusion images were obtained in multiple projections after intravenous injection of radiopharmaceutical. Ventilation scans intentionally deferred if perfusion scan and chest x-ray adequate for interpretation during COVID 19 epidemic. Views: Anterior, posterior, left lateral, right lateral, RPO, LPO, RAO, LAO RADIOPHARMACEUTICALS:  1.55 mCi Tc-28mMAA IV COMPARISON:  Chest radiograph July 05, 2019 FINDINGS: Radiotracer uptake is  homogeneous and symmetric bilaterally. There are no perfusion defects. IMPRESSION: No evident perfusion defects. Very low probability of pulmonary embolus. Electronically Signed   By: WLowella GripIII M.D.   On: 07/05/2019 17:09   UK[place]AORTA  Result Date: 07/05/2019 CLINICAL DATA:  Abdominal/back pain.  Hypertension EXAM: ULTRASOUND OF ABDOMINAL AORTA TECHNIQUE: Ultrasound examination of the abdominal aorta and proximal common iliac arteries was performed to evaluate for aneurysm. Additional color and Doppler images of the distal aorta were obtained to document patency. COMPARISON:  None. FINDINGS: Abdominal aortic measurements as follows: Proximal:  2.6 x 2.3 cm Mid:  2.1 x 1.8 cm Distal:  1.9 x 1.8 cm Patent: Yes, peak systolic velocity is 1938cm/s Common iliac arteries obscured by gas. No periaortic fluid or adenopathy. IMPRESSION: No abdominal aortic aneurysm. No periaortic fluid. Maximum diameter of the aorta is seen proximally at 2.6 x 2.3 cm. Ectatic abdominal aorta at risk for aneurysm  development. Recommend followup by ultrasound in 5 years. This recommendation follows ACR consensus guidelines: White Paper of the ACR Incidental Findings Committee II on Vascular Findings. J Am Coll Radiol 2013; 10:789-794. Electronically Signed   By: Lowella Grip III M.D.   On: 07/05/2019 14:26   DG Chest Portable 1 View  Result Date: 07/05/2019 CLINICAL DATA:  COVID-19 positive, chest pain EXAM: PORTABLE CHEST 1 VIEW COMPARISON:  06/24/2019 FINDINGS: The heart size and mediastinal contours are within normal limits. Both lungs are clear. The visualized skeletal structures are unremarkable. Tiny focus of mineralization adjacent to the greater tuberosity of the right humeral head suggesting rotator cuff calcific tendinopathy. IMPRESSION: No acute cardiopulmonary findings. Electronically Signed   By: Davina Poke M.D.   On: 07/05/2019 11:02   ECHOCARDIOGRAM COMPLETE  Result Date: 07/05/2019    ECHOCARDIOGRAM REPORT   Patient Name:   Jacqueline [name] Date of Exam: 07/05/2019 Medical Rec #:  [id number]    Height: Accession #:    [id number]   Weight: Date of Birth:  15-Jan-1981    BSA: Patient Age:    72 years     BP:           189/96 mmHg Patient Gender: F            HR:           94 bpm. Exam Location:  Inpatient Procedure: 2D Echo Indications:    Chest Pain 786.50 / R07.9  History:        Patient has prior history of Echocardiogram examinations, most                 recent 05/04/2018. Risk Factors:Diabetes, Dyslipidemia and                 Hypertension. Chronic kidney disease                 Sepsis.  Sonographer:    Vikki Ports Turrentine Referring Phys: [id number] [place]  1. Left ventricular ejection fraction, by visual estimation, is 60 to 65%. The left ventricle has normal function. Left ventricular septal wall thickness was moderately increased. Moderately increased left ventricular posterior wall thickness. There is severely increased left ventricular hypertrophy of the basal septum.  2. The left ventricle has no regional wall motion abnormalities.  3. Left ventricular diastolic parameters are consistent with Grade II diastolic dysfunction (pseudonormalization).  4. Elevated left ventricular end-diastolic pressure.  5. Global right ventricle has normal systolic function.The right ventricular size is normal. No increase in right ventricular wall thickness.  6. Left atrial size was mildly dilated.  7. Right atrial size was normal.  8. The mitral valve is normal in structure. No evidence of mitral valve regurgitation. No evidence of mitral stenosis.  9. The tricuspid valve is normal in structure. Tricuspid valve regurgitation is not demonstrated. 10. The aortic valve is normal in structure. Aortic valve regurgitation is not visualized. No evidence of aortic valve sclerosis or stenosis. 11. The pulmonic valve was normal in structure. Pulmonic valve regurgitation is not visualized. 12. The inferior  vena cava is normal in size with greater than 50% respiratory variability, suggesting right atrial pressure of 3 mmHg. FINDINGS  Left Ventricle: Left ventricular ejection fraction, by visual estimation, is 60 to 65%. The left ventricle has normal function. The left ventricle has no regional wall motion abnormalities. Moderately increased left ventricular posterior wall thickness.  There is severely increased left ventricular hypertrophy. Left ventricular diastolic parameters are consistent with  Grade II diastolic dysfunction (pseudonormalization). Elevated left ventricular end-diastolic pressure. Right Ventricle: The right ventricular size is normal. No increase in right ventricular wall thickness. Global RV systolic function is has normal systolic function. Left Atrium: Left atrial size was mildly dilated. Right Atrium: Right atrial size was normal in size Pericardium: There is no evidence of pericardial effusion. Mitral Valve: The mitral valve is normal in structure. No evidence of mitral valve regurgitation. No evidence of mitral valve stenosis by observation. Tricuspid Valve: The tricuspid valve is normal in structure. Tricuspid valve regurgitation is not demonstrated. Aortic Valve: The aortic valve is normal in structure. Aortic valve regurgitation is not visualized. The aortic valve is structurally normal, with no evidence of sclerosis or stenosis. Pulmonic Valve: The pulmonic valve was normal in structure. Pulmonic valve regurgitation is not visualized. Pulmonic regurgitation is not visualized. Aorta: The aortic root, ascending aorta and aortic arch are all structurally normal, with no evidence of dilitation or obstruction. Venous: The inferior vena cava is normal in size with greater than 50% respiratory variability, suggesting right atrial pressure of 3 mmHg. IAS/Shunts: No atrial level shunt detected by color flow Doppler. There is no evidence of a patent foramen ovale. No ventricular septal defect is seen  or detected. There is no evidence of an atrial septal defect.  Fransico Him MD Electronically signed by Fransico Him MD Signature Date/Time: 07/05/2019/3:50:18 PM    Final     Pending Labs Unresulted Labs (From admission, onward)    Start     Ordered   07/07/19 0500  C-reactive protein  Daily,   R     07/06/19 0758   07/07/19 0500  D-dimer, quantitative (not at [facility])  Daily,   R     07/06/19 0758   07/07/19 0500  Ferritin  Daily,   R     07/06/19 0758   07/07/19 0500  Comprehensive metabolic panel  Daily,   R     07/06/19 0758   07/07/19 0500  CBC with Differential/Platelet  Daily,   R     07/06/19 0758          Vitals/Pain Today's Vitals   07/06/19 1830 07/06/19 1900 07/06/19 1921 07/06/19 1930  BP: (!) 160/82 (!) 156/91  120/79  Pulse: 87 86  91  Resp: _0 Temp:  98.4 F (36.9 C)    TempSrc:  Oral    SpO2: 100% 100%  100%  Weight:      Height:      PainSc:   9      Isolation Precautions Airborne and Contact precautions  Medications Medications  nitroGLYCERIN (NITROSTAT) SL tablet 0.4 mg (0.4 mg Sublingual Given 07/05/19 1042)  acetaminophen (TYLENOL) tablet 1,000 mg (1,000 mg Oral Given 07/06/19 1919)  atorvastatin (LIPITOR) tablet 10 mg (10 mg Oral Given 07/06/19 1052)  carvedilol (COREG) tablet 25 mg (25 mg Oral Given 07/06/19 1712)  furosemide (LASIX) tablet 40 mg (40 mg Oral Given 07/06/19 1049)  losartan (COZAAR) tablet 25 mg (25 mg Oral Given 07/06/19 1051)  amLODipine (NORVASC) tablet 5 mg (5 mg Oral Given 07/06/19 1052)  DULoxetine (CYMBALTA) DR capsule 30 mg (30 mg Oral Given 07/06/19 1050)  sertraline (ZOLOFT) tablet 100 mg (100 mg Oral Given 07/05/19 2348)  ondansetron (ZOFRAN) injection 4 mg (has no administration in time range)  heparin injection 5,000 Units (5,000 Units Subcutaneous Given 07/06/19 1713)  aspirin EC tablet 81 mg (81 mg Oral Given 07/06/19 1051)  hydrALAZINE (APRESOLINE) tablet 50  mg (50 mg Oral Given 07/06/19 1712)  aspirin  chewable tablet 324 mg (324 mg Oral Given 07/05/19 1037)  technetium albumin aggregated (MAA) injection solution 1.5 millicurie (1.5 millicuries Intravenous Contrast Given 07/05/19 1701)    Mobility walks Low fall risk   Focused Assessments Pulmonary Assessment Handoff:  Lung sounds: Bilateral Breath Sounds: Diminished, Clear L Breath Sounds: Clear, Diminished R Breath Sounds: Clear, Diminished O2 Device: Room Air        R Recommendations: See Admitting Provider Note  Report given to:   Additional Notes: -

## 2019-07-07 ENCOUNTER — Encounter: Payer: Self-pay | Admitting: Family Medicine

## 2019-07-07 DIAGNOSIS — I10 Essential (primary) hypertension: Secondary | ICD-10-CM

## 2019-07-07 DIAGNOSIS — R079 Chest pain, unspecified: Secondary | ICD-10-CM | POA: Diagnosis not present

## 2019-07-07 LAB — COMPREHENSIVE METABOLIC PANEL
ALT: 28 U/L (ref 0–44)
AST: 35 U/L (ref 15–41)
Albumin: 2 g/dL — ABNORMAL LOW (ref 3.5–5.0)
Alkaline Phosphatase: 97 U/L (ref 38–126)
Anion gap: 7 (ref 5–15)
BUN: 20 mg/dL (ref 6–20)
CO2: 21 mmol/L — ABNORMAL LOW (ref 22–32)
Calcium: 8 mg/dL — ABNORMAL LOW (ref 8.9–10.3)
Chloride: 112 mmol/L — ABNORMAL HIGH (ref 98–111)
Creatinine, Ser: 1.94 mg/dL — ABNORMAL HIGH (ref 0.44–1.00)
GFR calc Af Amer: 37 mL/min — ABNORMAL LOW (ref >60)
GFR calc non Af Amer: 32 mL/min — ABNORMAL LOW (ref >60)
Glucose, Bld: 133 mg/dL — ABNORMAL HIGH (ref 70–99)
Potassium: 3.8 mmol/L (ref 3.5–5.1)
Sodium: 140 mmol/L (ref 135–145)
Total Bilirubin: 0.2 mg/dL — ABNORMAL LOW (ref 0.3–1.2)
Total Protein: 5.4 g/dL — ABNORMAL LOW (ref 6.5–8.1)

## 2019-07-07 LAB — C-REACTIVE PROTEIN: CRP: 0.6 mg/dL (ref <1.0)

## 2019-07-07 LAB — CBC WITH DIFFERENTIAL/PLATELET
Abs Immature Granulocytes: 0.02 10*3/uL (ref 0.00–0.07)
Basophils Absolute: 0 10*3/uL (ref 0.0–0.1)
Basophils Relative: 1 %
Eosinophils Absolute: 0.2 10*3/uL (ref 0.0–0.5)
Eosinophils Relative: 3 %
HCT: 27.8 % — ABNORMAL LOW (ref 36.0–46.0)
Hemoglobin: 9.1 g/dL — ABNORMAL LOW (ref 12.0–15.0)
Immature Granulocytes: 0 %
Lymphocytes Relative: 45 %
Lymphs Abs: 2.5 10*3/uL (ref 0.7–4.0)
MCH: 29.1 pg (ref 26.0–34.0)
MCHC: 32.7 g/dL (ref 30.0–36.0)
MCV: 88.8 fL (ref 80.0–100.0)
Monocytes Absolute: 0.5 10*3/uL (ref 0.1–1.0)
Monocytes Relative: 9 %
Neutro Abs: 2.3 10*3/uL (ref 1.7–7.7)
Neutrophils Relative %: 42 %
Platelets: 305 10*3/uL (ref 150–400)
RBC: 3.13 MIL/uL — ABNORMAL LOW (ref 3.87–5.11)
RDW: 12.9 % (ref 11.5–15.5)
WBC: 5.5 10*3/uL (ref 4.0–10.5)
nRBC: 0 % (ref 0.0–0.2)

## 2019-07-07 LAB — FERRITIN: Ferritin: 115 ng/mL (ref 11–307)

## 2019-07-07 LAB — D-DIMER, QUANTITATIVE: D-Dimer, Quant: 1.72 ug/mL-FEU — ABNORMAL HIGH (ref 0.00–0.50)

## 2019-07-07 MED ORDER — AMLODIPINE BESYLATE 10 MG PO TABS: 10.0000 mg | ORAL_TABLET | Freq: Every day | ORAL | Status: DC

## 2019-07-07 MED ORDER — AMLODIPINE BESYLATE 5 MG PO TABS
5.0000 mg | ORAL_TABLET | Freq: Once | ORAL | Status: AC
  Administered 2019-07-07: 09:00:00 5 mg via ORAL
  Filled 2019-07-07: qty 1

## 2019-07-07 MED ORDER — HYDRALAZINE HCL 50 MG PO TABS: 50.0000 mg | ORAL_TABLET | Freq: Three times a day (TID) | ORAL | 3 refills | Status: DC

## 2019-07-07 MED ORDER — AMLODIPINE BESYLATE 10 MG PO TABS: 10.0000 mg | ORAL_TABLET | Freq: Every day | ORAL | 3 refills | Status: DC

## 2019-07-07 MED ORDER — ASPIRIN 81 MG PO TBEC: 81.0000 mg | DELAYED_RELEASE_TABLET | Freq: Every day | ORAL | 12 refills | Status: DC

## 2019-07-07 NOTE — Progress Notes (Signed)
Progress Note  Due to the COVID-19 pandemic, this visit was completed with telemedicine (audio/video) technology to reduce patient and provider exposure as well as to preserve personal protective equipment.   Patient Name: Jacqueline [name] Date of Encounter: 07/07/2019  Primary Cardiologist: Minus Breeding, MD   Subjective   Feeling much better, BP improved with addition of amlodipine and hydralazine. No chest pain.   Inpatient Medications    Scheduled Meds: . [START ON 07/08/2019] amLODipine  10 mg Oral Daily  . amLODipine  5 mg Oral Once  . aspirin EC  81 mg Oral Daily  . atorvastatin  10 mg Oral Daily  . carvedilol  25 mg Oral BID WC  . DULoxetine  30 mg Oral q morning - 10a  . furosemide  40 mg Oral Daily  . heparin  5,000 Units Subcutaneous Q8H  . hydrALAZINE  50 mg Oral TID  . losartan  25 mg Oral Daily  . sertraline  100 mg Oral QHS   Continuous Infusions:  PRN Meds: acetaminophen, nitroGLYCERIN, ondansetron (ZOFRAN) IV   Vital Signs    Vitals:   07/07/19 0130 07/07/19 0400 07/07/19 0455 07/07/19 0818  BP: (!) 154/80 138/81 (!) 158/79 (!) 146/79  Pulse:  84 94 87  Resp: 18 19 18 10   Temp:   97.7 F (36.5 C)   TempSrc:   Oral   SpO2: 100% 98% 97% 98%  Weight:      Height:        Intake/Output Summary ([name] 24 hours) at 07/07/2019 0834 [name] data filed at 07/07/2019 0535 Gross per 24 hour  Intake 300 ml  Output -  Net 300 ml   [name] 3 Weights 07/05/2019 06/24/2019 03/15/2019  Weight (lbs) 231 lb 231 lb 240 lb  Weight (kg) 104.781 kg 104.781 kg 108.863 kg      Telemetry    SR - Personally Reviewed  ECG    No new - Personally Reviewed  Physical Exam   VITAL SIGNS:  reviewed GEN:  no acute distress PSYCH:  normal affect  Labs    Chemistry Recent Labs  Lab 07/01/19 0850 07/05/19 1044  NA 139 140  K 4.0 3.9  CL 109 112*  CO2 20* 20*  GLUCOSE 203* 124*  BUN 25* 20  CREATININE 2.08* 2.25*  CALCIUM 8.3* 8.2*  GFRNONAA 29* 27*  GFRAA  34* 31*  ANIONGAP 10 8     Hematology Recent Labs  Lab 07/01/19 0850 07/05/19 1044  WBC 9.4 5.9  RBC 3.42* 3.32*  HGB 10.0* 9.7*  HCT 30.7* 30.3*  MCV 89.8 91.3  MCH 29.2 29.2  MCHC 32.6 32.0  RDW 12.9 13.0  PLT 370 326    Cardiac EnzymesNo results for input(s): TROPONINI in the [name] 168 hours. No results for input(s): TROPIPOC in the [name] 168 hours.   BNP Recent Labs  Lab 07/05/19 1044  BNP 166.5*     DDimer  Recent Labs  Lab 07/05/19 1044  DDIMER 1.24*     Radiology    NM Pulmonary Perfusion  Result Date: 07/05/2019 CLINICAL DATA:  Chest pain.  COVID-19 positive EXAM: NUCLEAR MEDICINE PERFUSION LUNG SCAN TECHNIQUE: Perfusion images were obtained in multiple projections after intravenous injection of radiopharmaceutical. Ventilation scans intentionally deferred if perfusion scan and chest x-ray adequate for interpretation during COVID 19 epidemic. Views: Anterior, posterior, left lateral, right lateral, RPO, LPO, RAO, LAO RADIOPHARMACEUTICALS:  1.55 mCi Tc-6m MAA IV COMPARISON:  Chest radiograph July 05, 2019 FINDINGS: Radiotracer uptake is  homogeneous and symmetric bilaterally. There are no perfusion defects. IMPRESSION: No evident perfusion defects. Very low probability of pulmonary embolus. Electronically Signed   By: Lowella Grip III M.D.   On: 07/05/2019 17:09   US AORTA  Result Date: 07/05/2019 CLINICAL DATA:  Abdominal/back pain.  Hypertension EXAM: ULTRASOUND OF ABDOMINAL AORTA TECHNIQUE: Ultrasound examination of the abdominal aorta and proximal common iliac arteries was performed to evaluate for aneurysm. Additional color and Doppler images of the distal aorta were obtained to document patency. COMPARISON:  None. FINDINGS: Abdominal aortic measurements as follows: Proximal:  2.6 x 2.3 cm Mid:  2.1 x 1.8 cm Distal:  1.9 x 1.8 cm Patent: Yes, peak systolic velocity is 123XX123 cm/s Common iliac arteries obscured by gas. No periaortic fluid or adenopathy.  IMPRESSION: No abdominal aortic aneurysm. No periaortic fluid. Maximum diameter of the aorta is seen proximally at 2.6 x 2.3 cm. Ectatic abdominal aorta at risk for aneurysm development. Recommend followup by ultrasound in 5 years. This recommendation follows ACR consensus guidelines: White Paper of the ACR Incidental Findings Committee II on Vascular Findings. J Am Coll Radiol 2013; 10:789-794. Electronically Signed   By: Lowella Grip III M.D.   On: 07/05/2019 14:26   DG Chest Portable 1 View  Result Date: 07/05/2019 CLINICAL DATA:  COVID-19 positive, chest pain EXAM: PORTABLE CHEST 1 VIEW COMPARISON:  06/24/2019 FINDINGS: The heart size and mediastinal contours are within normal limits. Both lungs are clear. The visualized skeletal structures are unremarkable. Tiny focus of mineralization adjacent to the greater tuberosity of the right humeral head suggesting rotator cuff calcific tendinopathy. IMPRESSION: No acute cardiopulmonary findings. Electronically Signed   By: Davina Poke M.D.   On: 07/05/2019 11:02   ECHOCARDIOGRAM COMPLETE  Result Date: 07/05/2019   ECHOCARDIOGRAM REPORT   Patient Name:   Jacqueline [name] Date of Exam: 07/05/2019 Medical Rec #:  [id number]    Height: Accession #:    [id number]   Weight: Date of Birth:  11-Feb-1981    BSA: Patient Age:    38 years     BP:           189/96 mmHg Patient Gender: F            HR:           94 bpm. Exam Location:  Inpatient Procedure: 2D Echo Indications:    Chest Pain 786.50 / R07.9  History:        Patient has prior history of Echocardiogram examinations, most                 recent 05/04/2018. Risk Factors:Diabetes, Dyslipidemia and                 Hypertension. Chronic kidney disease                 Sepsis.  Sonographer:    Vikki Ports Turrentine Referring Phys: [id number] [place]  1. Left ventricular ejection fraction, by visual estimation, is 60 to 65%. The left ventricle has normal function. Left ventricular septal wall thickness  was moderately increased. Moderately increased left ventricular posterior wall thickness. There is severely increased left ventricular hypertrophy of the basal septum.  2. The left ventricle has no regional wall motion abnormalities.  3. Left ventricular diastolic parameters are consistent with Grade II diastolic dysfunction (pseudonormalization).  4. Elevated left ventricular end-diastolic pressure.  5. Global right ventricle has normal systolic function.The right ventricular size is normal. No increase in right ventricular wall  thickness.  6. Left atrial size was mildly dilated.  7. Right atrial size was normal.  8. The mitral valve is normal in structure. No evidence of mitral valve regurgitation. No evidence of mitral stenosis.  9. The tricuspid valve is normal in structure. Tricuspid valve regurgitation is not demonstrated. 10. The aortic valve is normal in structure. Aortic valve regurgitation is not visualized. No evidence of aortic valve sclerosis or stenosis. 11. The pulmonic valve was normal in structure. Pulmonic valve regurgitation is not visualized. 12. The inferior vena cava is normal in size with greater than 50% respiratory variability, suggesting right atrial pressure of 3 mmHg. FINDINGS  Left Ventricle: Left ventricular ejection fraction, by visual estimation, is 60 to 65%. The left ventricle has normal function. The left ventricle has no regional wall motion abnormalities. Moderately increased left ventricular posterior wall thickness.  There is severely increased left ventricular hypertrophy. Left ventricular diastolic parameters are consistent with Grade II diastolic dysfunction (pseudonormalization). Elevated left ventricular end-diastolic pressure. Right Ventricle: The right ventricular size is normal. No increase in right ventricular wall thickness. Global RV systolic function is has normal systolic function. Left Atrium: Left atrial size was mildly dilated. Right Atrium: Right atrial size  was normal in size Pericardium: There is no evidence of pericardial effusion. Mitral Valve: The mitral valve is normal in structure. No evidence of mitral valve regurgitation. No evidence of mitral valve stenosis by observation. Tricuspid Valve: The tricuspid valve is normal in structure. Tricuspid valve regurgitation is not demonstrated. Aortic Valve: The aortic valve is normal in structure. Aortic valve regurgitation is not visualized. The aortic valve is structurally normal, with no evidence of sclerosis or stenosis. Pulmonic Valve: The pulmonic valve was normal in structure. Pulmonic valve regurgitation is not visualized. Pulmonic regurgitation is not visualized. Aorta: The aortic root, ascending aorta and aortic arch are all structurally normal, with no evidence of dilitation or obstruction. Venous: The inferior vena cava is normal in size with greater than 50% respiratory variability, suggesting right atrial pressure of 3 mmHg. IAS/Shunts: No atrial level shunt detected by color flow Doppler. There is no evidence of a patent foramen ovale. No ventricular septal defect is seen or detected. There is no evidence of an atrial septal defect.  Fransico Him MD Electronically signed by Fransico Him MD Signature Date/Time: 07/05/2019/3:50:18 PM    Final     Cardiac Studies   Echo 07/05/2019 1. Left ventricular ejection fraction, by visual estimation, is 60 to 65%. The left ventricle has normal function. Left ventricular septal wall thickness was moderately increased. Moderately increased left ventricular posterior wall thickness. There is  severely increased left ventricular hypertrophy of the basal septum. 2. The left ventricle has no regional wall motion abnormalities. 3. Left ventricular diastolic parameters are consistent with Grade II diastolic dysfunction (pseudonormalization). 4. Elevated left ventricular end-diastolic pressure. 5. Global right ventricle has normal systolic function.The right  ventricular size is normal. No increase in right ventricular wall thickness. 6. Left atrial size was mildly dilated. 7. Right atrial size was normal. 8. The mitral valve is normal in structure. No evidence of mitral valve regurgitation. No evidence of mitral stenosis. 9. The tricuspid valve is normal in structure. Tricuspid valve regurgitation is not demonstrated. 10. The aortic valve is normal in structure. Aortic valve regurgitation is not visualized. No evidence of aortic valve sclerosis or stenosis. 11. The pulmonic valve was normal in structure. Pulmonic valve regurgitation is not visualized. 12. The inferior vena cava is normal in size with  greater than 50% respiratory variability, suggesting right atrial pressure of 3 mmHg.      Patient Profile     Kiarrah Clercyis a 38 y.o.femalewith a PMH of HTN, chronic diastolic CHF, DM type 2, OSA, morbid obesitys/p gastric bypass, and CKD stage 3who is being seen today for the evaluation of chest painat the request ofDr. Roosevelt Locks.  Assessment & Plan    Principal Problem:   Chest pain Active Problems:   Hypertension  Feeling much better today and has been chest pain-free for the past 48 hours.  Her chest pain seems correlated with her episodes of hypertension with systolics greater than 123XX123 mmHg.  Control of blood pressure will be paramount to controlling her symptoms.  We have discussed 3 times daily dosing of hydralazine, and she assures me she will have no difficulty with compliance given that she works from home and has everything under control.  I will increase her amlodipine to 10 mg daily as she is tolerating this well and never had issues with lower extremity swelling as a reason for cessation.  [facility] will sign off.   Medication Recommendations:  Amlodipine 10 mg daily Aspirin 81 mg daily Atorvastatin 10 mg daily, LDL 68 (goal less than 70) Carvedilol 25 mg twice daily Furosemide 40 mg p.o. daily Hydralazine 50  mg 3 times daily Losartan 25 mg daily  Other recommendations (labs, testing, etc): Basic metabolic panel in 3 to 5 days after hospital dismissal Follow up as an outpatient: Follow-up cardiology clinic for further management of hypertension and chest pain 2 to 4 weeks.  (We will arrange).   For questions or updates, please contact [place] Please consult [email or web address] for contact info under        Signed, Elouise Munroe, MD  07/07/2019, 8:34 AM

## 2019-07-07 NOTE — Progress Notes (Signed)
Nsg Discharge Note  Admit Date:  07/05/2019 Discharge date: 07/07/2019   Jacqueline [name] to be D/C'd Home per MD order.  AVS completed.    Discharge Medication: Allergies as of 07/07/2019   No Known Allergies     Medication List    STOP taking these medications   bumetanide 1 MG tablet Commonly known as: BUMEX   cephALEXin 500 MG capsule Commonly known as: KEFLEX   cyclobenzaprine 10 MG tablet Commonly known as: FLEXERIL   metoCLOPramide 10 MG tablet Commonly known as: REGLAN     TAKE these medications   acetaminophen 500 MG tablet Commonly known as: TYLENOL Take 1,000 mg by mouth every 6 (six) hours as needed for mild pain or moderate pain.   amLODipine 10 MG tablet Commonly known as: NORVASC Take 1 tablet (10 mg total) by mouth daily. Start taking on: July 08, 2019   aspirin 81 MG EC tablet Take 1 tablet (81 mg total) by mouth daily. Start taking on: July 08, 2019   atorvastatin 10 MG tablet Commonly known as: LIPITOR Take 10 mg by mouth daily.   carvedilol 25 MG tablet Commonly known as: COREG Take 1 tablet (25 mg total) by mouth 2 (two) times daily with a meal.   DULoxetine 30 MG capsule Commonly known as: CYMBALTA Take 30 mg by mouth every morning.   furosemide 40 MG tablet Commonly known as: LASIX Take 40 mg by mouth daily.   hydrALAZINE 50 MG tablet Commonly known as: APRESOLINE Take 1 tablet (50 mg total) by mouth 3 (three) times daily.   losartan 25 MG tablet Commonly known as: COZAAR Take 25 mg by mouth daily.   sertraline 100 MG tablet Commonly known as: ZOLOFT Take 100 mg by mouth at bedtime.   Vitamin D (Ergocalciferol) 1.25 MG (50000 UT) Caps capsule Commonly known as: DRISDOL Take 50,000 Units by mouth once a week. Thursday       Discharge Assessment: Vitals:   07/07/19 0455 07/07/19 0818  BP: (!) 158/79 (!) 146/79  Pulse: 94 87  Resp: 18 10  Temp: 97.7 F (36.5 C)   SpO2: 97% 98%   Skin clean, dry and intact  without evidence of skin break down, no evidence of skin tears noted. IV catheter discontinued intact. Site without signs and symptoms of complications - no redness or edema noted at insertion site, patient denies c/o pain - only slight tenderness at site.  Dressing with slight pressure applied.  D/c Instructions-Education: Discharge instructions given to patient/family with verbalized understanding. D/c education completed with patient/family including follow up instructions, medication list, d/c activities limitations if indicated, with other d/c instructions as indicated by MD - patient able to verbalize understanding, all questions fully answered. Patient instructed to return to ED, call 911, or call MD for any changes in condition.  Patient educated to quarantine until Jan 1st. All questions answered.  Patient escorted via [place], and D/C home via private auto.  Hiram Comber, RN 07/07/2019 10:13 AM

## 2019-07-07 NOTE — Discharge Summary (Signed)
Physician Discharge Summary  Jacqueline [name] [id number] DOB: January 29, 1981 DOA: 07/05/2019  PCP: Audley Hose, MD  Admit date: 07/05/2019 Discharge date: 07/07/2019  Time spent: 25 minutes  Recommendations for Outpatient Follow-up:  1. New meds added per cardiology amlodipine hydralazine 2. Outpatient follow-up will need coordination of lab work in about 1 week from discharge 3. Will need cardiology appointment as per them  Discharge Diagnoses:  Principal Problem:   Chest pain Active Problems:   Hypertension   Discharge Condition: Improved  Diet recommendation: Heart healthy  Filed Weights   07/05/19 0933  Weight: 104.8 kg    History of present illness:  38 fem HTN, DM TY ii A1c 10, OSA/CPAP, CKD iii, paroxysmal A. 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 Rx UTI in the emergency room 06/24/2019 with nausea vomiting cough headache chills-diagnosed with hemiplegic migraine and Rx for UTI-neg POC test for  coronavirus 12/7  Return 12/14 persistence of symptoms-instructed to return if anything worsens Came to ED 12/18 new onset chest pain centralized retested 12/18 found to be positive-other symptoms include dry cough subjective fevers BUNs/creatinine up from baseline 18/1.8 Hospital Course:  Coronavirus positive Biomarkers pending not a candidate for remdesivir steroids at this time No oxygen requirement at this time she was stable during hospital stay and discharged with out further needs Chest pain Sounds pleuritic-VQ scan chest x-ray negative Symptomatic management no oxygen requirement monitor Cardiology recommends hydralazine 25 iii/day-echocardiogram this admission showed EF 60-65% with increased LVH and will need outpatient management Her chest pain sounded like it was related to her hypertension she is started on hydralazine, amlodipine increased and will need follow-up with cardiology Her Bumex was changed to Lasix and she will continue the same Super  morbid obesity/OSA Monitor trends needs outpatient further weight loss management Not using anymore Has lost 73lbs and congratulated Paroxysmal A. fib Continue Coreg 25 twice daily, losartan 25 daily, amlodipine 5-->increase to 10 HFpEF Continue Lasix 40 daily DM TY 2 on control  Status post Roux-en-Y 08/2018  Consultations:  Cardiology   Discharge Exam: Vitals:   07/07/19 0455 07/07/19 0818  BP: (!) 158/79 (!) 146/79  Pulse: 94 87  Resp: 18 10  Temp: 97.7 F (36.5 C)   SpO2: 97% 98%    General: Awake alert pleasant no distress EOMI NCAT no focal deficit looks well Cardiovascular: S1-S2 no murmur rub or gallop no added sounds Respiratory: Chest clinically clear no added sound no rales no rhonchi. Abdomen soft nontender no rebound no guarding  Discharge Instructions   Discharge Instructions    Call MD for:  difficulty breathing, headache or visual disturbances   Complete by: As directed    Call MD for:  temperature >100.4   Complete by: As directed    Diet - low sodium heart healthy   Complete by: As directed    Discharge instructions   Complete by: As directed    Take ur medications as indicated Follow up with primary MD We will keep you out of work until 07/19/2019 and you may return on 07/20/2019 to work--Would suggest if u have severe SOB or fevers take tylenol but if u feel u cannot breahte get attention immediately We have called in your meds to the pharmacy Make sure you seek follow up in several days after 07/20/2019 with ur MD   Increase activity slowly   Complete by: As directed    MyChart COVID-19 home monitoring program   Complete by: Jul 07, 2019  Is the patient willing to use the [place] for home monitoring?: Yes   Temperature monitoring   Complete by: Jul 07, 2019    After how many days would you like to receive a notification of this patient's flowsheet entries?: 1     Allergies as of 07/07/2019   No Known Allergies     Medication  List    STOP taking these medications   bumetanide 1 MG tablet Commonly known as: BUMEX   cephALEXin 500 MG capsule Commonly known as: KEFLEX   cyclobenzaprine 10 MG tablet Commonly known as: FLEXERIL   metoCLOPramide 10 MG tablet Commonly known as: REGLAN     TAKE these medications   acetaminophen 500 MG tablet Commonly known as: TYLENOL Take 1,000 mg by mouth every 6 (six) hours as needed for mild pain or moderate pain.   amLODipine 10 MG tablet Commonly known as: NORVASC Take 1 tablet (10 mg total) by mouth daily. Start taking on: July 08, 2019   aspirin 81 MG EC tablet Take 1 tablet (81 mg total) by mouth daily. Start taking on: July 08, 2019   atorvastatin 10 MG tablet Commonly known as: LIPITOR Take 10 mg by mouth daily.   carvedilol 25 MG tablet Commonly known as: COREG Take 1 tablet (25 mg total) by mouth 2 (two) times daily with a meal.   DULoxetine 30 MG capsule Commonly known as: CYMBALTA Take 30 mg by mouth every morning.   furosemide 40 MG tablet Commonly known as: LASIX Take 40 mg by mouth daily.   hydrALAZINE 50 MG tablet Commonly known as: APRESOLINE Take 1 tablet (50 mg total) by mouth 3 (three) times daily.   losartan 25 MG tablet Commonly known as: COZAAR Take 25 mg by mouth daily.   sertraline 100 MG tablet Commonly known as: ZOLOFT Take 100 mg by mouth at bedtime.   Vitamin D (Ergocalciferol) 1.25 MG (50000 UT) Caps capsule Commonly known as: DRISDOL Take 50,000 Units by mouth once a week. Thursday      No Known Allergies    The results of significant diagnostics from this hospitalization (including imaging, microbiology, ancillary and laboratory) are listed below for reference.    Significant Diagnostic Studies: CT Head Wo Contrast  Result Date: 06/24/2019 CLINICAL DATA:  Stroke with right-sided weakness and headache. EXAM: CT HEAD WITHOUT CONTRAST TECHNIQUE: Contiguous axial images were obtained from the base of  the skull through the vertex without intravenous contrast. COMPARISON:  09/12/2018 FINDINGS: Brain: No evidence of acute infarction, hemorrhage, hydrocephalus, extra-axial collection or mass lesion/mass effect. Vascular: No hyperdense vessel or unexpected calcification. Skull: Normal. Negative for fracture or focal lesion. Sinuses/Orbits: No acute finding. IMPRESSION: Negative head CT. Electronically Signed   By: Monte Fantasia M.D.   On: 06/24/2019 10:55   MR BRAIN WO CONTRAST  Result Date: 06/24/2019 CLINICAL DATA:  Right-sided weakness and decreased sensation EXAM: MRI HEAD WITHOUT CONTRAST TECHNIQUE: Multiplanar, multiecho pulse sequences of the brain and surrounding structures were obtained without intravenous contrast. COMPARISON:  2018 FINDINGS: Brain: There is no acute infarction or intracranial hemorrhage. There is no intracranial mass, mass effect, or edema. There is no hydrocephalus or extra-axial fluid collection. Vascular: Major vessel flow voids at the skull base are preserved. Skull and upper cervical spine: Normal marrow signal is preserved. Sinuses/Orbits: Paranasal sinuses are aerated. Orbits are unremarkable. Other: Sella is unremarkable.  Mastoid air cells are clear. IMPRESSION: No evidence of recent infarction, intracranial hemorrhage mass. Electronically Signed   By:  Macy Mis M.D.   On: 06/24/2019 13:54   NM Pulmonary Perfusion  Result Date: 07/05/2019 CLINICAL DATA:  Chest pain.  COVID-19 positive EXAM: NUCLEAR MEDICINE PERFUSION LUNG SCAN TECHNIQUE: Perfusion images were obtained in multiple projections after intravenous injection of radiopharmaceutical. Ventilation scans intentionally deferred if perfusion scan and chest x-ray adequate for interpretation during COVID 19 epidemic. Views: Anterior, posterior, left lateral, right lateral, RPO, LPO, RAO, LAO RADIOPHARMACEUTICALS:  1.55 mCi Tc-50m MAA IV COMPARISON:  Chest radiograph July 05, 2019 FINDINGS: Radiotracer uptake  is homogeneous and symmetric bilaterally. There are no perfusion defects. IMPRESSION: No evident perfusion defects. Very low probability of pulmonary embolus. Electronically Signed   By: Lowella Grip III M.D.   On: 07/05/2019 17:09   US AORTA  Result Date: 07/05/2019 CLINICAL DATA:  Abdominal/back pain.  Hypertension EXAM: ULTRASOUND OF ABDOMINAL AORTA TECHNIQUE: Ultrasound examination of the abdominal aorta and proximal common iliac arteries was performed to evaluate for aneurysm. Additional color and Doppler images of the distal aorta were obtained to document patency. COMPARISON:  None. FINDINGS: Abdominal aortic measurements as follows: Proximal:  2.6 x 2.3 cm Mid:  2.1 x 1.8 cm Distal:  1.9 x 1.8 cm Patent: Yes, peak systolic velocity is [id number] cm/s Common iliac arteries obscured by gas. No periaortic fluid or adenopathy. IMPRESSION: No abdominal aortic aneurysm. No periaortic fluid. Maximum diameter of the aorta is seen proximally at 2.6 x 2.3 cm. Ectatic abdominal aorta at risk for aneurysm development. Recommend followup by ultrasound in 5 years. This recommendation follows ACR consensus guidelines: White Paper of the ACR Incidental Findings Committee II on Vascular Findings. J Am Coll Radiol 2013; 10:789-794. Electronically Signed   By: Lowella Grip III M.D.   On: 07/05/2019 14:26   DG Chest Portable 1 View  Result Date: 07/05/2019 CLINICAL DATA:  COVID-19 positive, chest pain EXAM: PORTABLE CHEST 1 VIEW COMPARISON:  06/24/2019 FINDINGS: The heart size and mediastinal contours are within normal limits. Both lungs are clear. The visualized skeletal structures are unremarkable. Tiny focus of mineralization adjacent to the greater tuberosity of the right humeral head suggesting rotator cuff calcific tendinopathy. IMPRESSION: No acute cardiopulmonary findings. Electronically Signed   By: Davina Poke M.D.   On: 07/05/2019 11:02   DG Chest Portable 1 View  Result Date:  06/24/2019 CLINICAL DATA:  Cough and chills EXAM: PORTABLE CHEST 1 VIEW COMPARISON:  03/15/2019 FINDINGS: Generous heart size accentuated by low volumes. Negative aortic and hilar contours. There is no edema, consolidation, effusion, or pneumothorax. Degenerative endplate spurring that is prominent for age. IMPRESSION: Negative for pneumonia. Electronically Signed   By: Monte Fantasia M.D.   On: 06/24/2019 09:57   ECHOCARDIOGRAM COMPLETE  Result Date: 07/05/2019   ECHOCARDIOGRAM REPORT   Patient Name:   Jacqueline [name] Date of Exam: 07/05/2019 Medical Rec #:  [id number]    Height: Accession #:    [id number]   Weight: Date of Birth:  [birth year]    BSA: Patient Age:    60 years     BP:           189/96 mmHg Patient Gender: F            HR:           94 bpm. Exam Location:  Inpatient Procedure: 2D Echo Indications:    Chest Pain 786.50 / R07.9  History:        Patient has prior history of Echocardiogram examinations, most  recent 05/04/2018. Risk Factors:Diabetes, Dyslipidemia and                 Hypertension. Chronic kidney disease                 Sepsis.  Sonographer:    Vikki Ports Turrentine Referring Phys: [id number] [place]  1. Left ventricular ejection fraction, by visual estimation, is 60 to 65%. The left ventricle has normal function. Left ventricular septal wall thickness was moderately increased. Moderately increased left ventricular posterior wall thickness. There is severely increased left ventricular hypertrophy of the basal septum.  2. The left ventricle has no regional wall motion abnormalities.  3. Left ventricular diastolic parameters are consistent with Grade II diastolic dysfunction (pseudonormalization).  4. Elevated left ventricular end-diastolic pressure.  5. Global right ventricle has normal systolic function.The right ventricular size is normal. No increase in right ventricular wall thickness.  6. Left atrial size was mildly dilated.  7. Right atrial size was normal.   8. The mitral valve is normal in structure. No evidence of mitral valve regurgitation. No evidence of mitral stenosis.  9. The tricuspid valve is normal in structure. Tricuspid valve regurgitation is not demonstrated. 10. The aortic valve is normal in structure. Aortic valve regurgitation is not visualized. No evidence of aortic valve sclerosis or stenosis. 11. The pulmonic valve was normal in structure. Pulmonic valve regurgitation is not visualized. 12. The inferior vena cava is normal in size with greater than 50% respiratory variability, suggesting right atrial pressure of 3 mmHg. FINDINGS  Left Ventricle: Left ventricular ejection fraction, by visual estimation, is 60 to 65%. The left ventricle has normal function. The left ventricle has no regional wall motion abnormalities. Moderately increased left ventricular posterior wall thickness.  There is severely increased left ventricular hypertrophy. Left ventricular diastolic parameters are consistent with Grade II diastolic dysfunction (pseudonormalization). Elevated left ventricular end-diastolic pressure. Right Ventricle: The right ventricular size is normal. No increase in right ventricular wall thickness. Global RV systolic function is has normal systolic function. Left Atrium: Left atrial size was mildly dilated. Right Atrium: Right atrial size was normal in size Pericardium: There is no evidence of pericardial effusion. Mitral Valve: The mitral valve is normal in structure. No evidence of mitral valve regurgitation. No evidence of mitral valve stenosis by observation. Tricuspid Valve: The tricuspid valve is normal in structure. Tricuspid valve regurgitation is not demonstrated. Aortic Valve: The aortic valve is normal in structure. Aortic valve regurgitation is not visualized. The aortic valve is structurally normal, with no evidence of sclerosis or stenosis. Pulmonic Valve: The pulmonic valve was normal in structure. Pulmonic valve regurgitation is not  visualized. Pulmonic regurgitation is not visualized. Aorta: The aortic root, ascending aorta and aortic arch are all structurally normal, with no evidence of dilitation or obstruction. Venous: The inferior vena cava is normal in size with greater than 50% respiratory variability, suggesting right atrial pressure of 3 mmHg. IAS/Shunts: No atrial level shunt detected by color flow Doppler. There is no evidence of a patent foramen ovale. No ventricular septal defect is seen or detected. There is no evidence of an atrial septal defect.  Fransico Him MD Electronically signed by Fransico Him MD Signature Date/Time: 07/05/2019/3:50:18 PM    Final    CT Renal Stone Study  Result Date: 07/01/2019 CLINICAL DATA:  Left flank pain EXAM: CT ABDOMEN AND PELVIS WITHOUT CONTRAST TECHNIQUE: Multidetector CT imaging of the abdomen and pelvis was performed following the standard protocol without IV contrast.  COMPARISON:  10/03/2018 FINDINGS: Lower chest: No acute abnormality. Hepatobiliary: No focal liver abnormality is seen. No gallstones, gallbladder wall thickening, or biliary dilatation. Pancreas: Unremarkable. Spleen: Unremarkable. Adrenals/Urinary Tract: Kidneys are unremarkable. There is no definite ureteral stone. No hydroureter. Bladder is unremarkable. Right adrenal is unremarkable. Stable thickening of the left adrenal. Stomach/Bowel: Evidence of prior gastric bypass. Bowel is normal in caliber. Possible appendix is unremarkable. Vascular/Lymphatic: No significant vascular findings are present. No enlarged abdominal or pelvic lymph nodes. Reproductive: Uterus and bilateral adnexa are unremarkable. Other: Small fat containing ventral hernia above the umbilicus. No ascites. Musculoskeletal: No acute osseous abnormality. IMPRESSION: No findings to account for reported symptoms. Specifically, no urinary tract calculi. Electronically Signed   By: Macy Mis M.D.   On: 07/01/2019 12:06    Microbiology: Recent  Results (from the past 240 hour(s))  Urine culture     Status: Abnormal   Collection Time: 07/01/19  8:50 AM   Specimen: Urine, Random  Result Value Ref Range Status   Specimen Description URINE, RANDOM  Final   Special Requests   Final    NONE Performed at [facility], [address] [place],  [address]    Culture >=100,000 COLONIES/mL KLEBSIELLA PNEUMONIAE (A)  Final   Report Status 07/03/2019 FINAL  Final   Organism ID, Bacteria KLEBSIELLA PNEUMONIAE (A)  Final      Susceptibility   Klebsiella pneumoniae - MIC*    AMPICILLIN RESISTANT Resistant     CEFAZOLIN <=4 SENSITIVE Sensitive     CEFTRIAXONE <=1 SENSITIVE Sensitive     CIPROFLOXACIN <=0.25 SENSITIVE Sensitive     GENTAMICIN <=1 SENSITIVE Sensitive     IMIPENEM <=0.25 SENSITIVE Sensitive     NITROFURANTOIN 32 SENSITIVE Sensitive     TRIMETH/SULFA <=20 SENSITIVE Sensitive     AMPICILLIN/SULBACTAM 4 SENSITIVE Sensitive     PIP/TAZO <=4 SENSITIVE Sensitive     * >=100,000 COLONIES/mL KLEBSIELLA PNEUMONIAE     Labs: Basic Metabolic Panel: Recent Labs  Lab 07/01/19 0850 07/05/19 1044 07/07/19 0734  NA 139 140 140  K 4.0 3.9 3.8  CL 109 112* 112*  CO2 20* 20* 21*  GLUCOSE 203* 124* 133*  BUN 25* 20 20  CREATININE 2.08* 2.25* 1.94*  CALCIUM 8.3* 8.2* 8.0*   Liver Function Tests: Recent Labs  Lab 07/07/19 0734  AST 35  ALT 28  ALKPHOS 97  BILITOT 0.2*  PROT 5.4*  ALBUMIN 2.0*   No results for input(s): LIPASE, AMYLASE in the last 168 hours. No results for input(s): AMMONIA in the last 168 hours. CBC: Recent Labs  Lab 07/01/19 0850 07/05/19 1044 07/07/19 0734  WBC 9.4 5.9 5.5  NEUTROABS 6.1 3.5 2.3  HGB 10.0* 9.7* 9.1*  HCT 30.7* 30.3* 27.8*  MCV 89.8 91.3 88.8  PLT 370 326 305   Cardiac Enzymes: No results for input(s): CKTOTAL, CKMB, CKMBINDEX, TROPONINI in the last 168 hours. BNP: BNP (last 3 results) Recent Labs    09/12/18 1015 03/15/19 1559 07/05/19 1044  BNP 44.7 41.0  166.5*    ProBNP (last 3 results) No results for input(s): PROBNP in the last 8760 hours.  CBG: No results for input(s): GLUCAP in the last 168 hours.     Signed:  Nita Sells MD   [facility] 07/07/2019, 9:53 AM

## 2019-07-29 ENCOUNTER — Ambulatory Visit
Admission: RE | Admit: 2019-07-29 | Discharge: 2019-07-29 | Disposition: A | Discharge status: Home / Self Care | Payer: BC Managed Care – PPO | Source: Ambulatory Visit | Attending: Internal Medicine | Admitting: Internal Medicine

## 2019-07-29 ENCOUNTER — Other Ambulatory Visit: Payer: Self-pay | Admitting: Internal Medicine

## 2019-07-29 ENCOUNTER — Other Ambulatory Visit: Payer: Self-pay

## 2019-07-29 DIAGNOSIS — R2241 Localized swelling, mass and lump, right lower limb: Secondary | ICD-10-CM

## 2019-07-29 DIAGNOSIS — N1832 Chronic kidney disease, stage 3b: Secondary | ICD-10-CM | POA: Diagnosis not present

## 2019-07-29 DIAGNOSIS — M7989 Other specified soft tissue disorders: Secondary | ICD-10-CM | POA: Diagnosis not present

## 2019-07-29 DIAGNOSIS — I10 Essential (primary) hypertension: Secondary | ICD-10-CM | POA: Diagnosis not present

## 2019-07-29 DIAGNOSIS — E1142 Type 2 diabetes mellitus with diabetic polyneuropathy: Secondary | ICD-10-CM | POA: Diagnosis not present

## 2019-07-29 DIAGNOSIS — M7731 Calcaneal spur, right foot: Secondary | ICD-10-CM | POA: Diagnosis not present

## 2019-08-01 DIAGNOSIS — E1121 Type 2 diabetes mellitus with diabetic nephropathy: Secondary | ICD-10-CM | POA: Diagnosis not present

## 2019-08-01 DIAGNOSIS — R2241 Localized swelling, mass and lump, right lower limb: Secondary | ICD-10-CM | POA: Diagnosis not present

## 2019-08-01 DIAGNOSIS — E782 Mixed hyperlipidemia: Secondary | ICD-10-CM | POA: Diagnosis not present

## 2019-08-01 DIAGNOSIS — R7989 Other specified abnormal findings of blood chemistry: Secondary | ICD-10-CM | POA: Diagnosis not present

## 2019-08-02 DIAGNOSIS — G4733 Obstructive sleep apnea (adult) (pediatric): Secondary | ICD-10-CM | POA: Diagnosis not present

## 2019-08-05 ENCOUNTER — Ambulatory Visit: Payer: BC Managed Care – PPO | Admitting: Podiatry

## 2019-08-08 ENCOUNTER — Other Ambulatory Visit: Payer: Self-pay | Admitting: Podiatry

## 2019-08-08 ENCOUNTER — Ambulatory Visit (INDEPENDENT_AMBULATORY_CARE_PROVIDER_SITE_OTHER): Payer: BC Managed Care – PPO

## 2019-08-08 ENCOUNTER — Ambulatory Visit (INDEPENDENT_AMBULATORY_CARE_PROVIDER_SITE_OTHER): Payer: BC Managed Care – PPO | Admitting: Podiatry

## 2019-08-08 ENCOUNTER — Encounter: Payer: Self-pay | Admitting: Podiatry

## 2019-08-08 ENCOUNTER — Other Ambulatory Visit: Payer: Self-pay

## 2019-08-08 VITALS — BP 144/85 | HR 92 | Temp 97.2°F

## 2019-08-08 DIAGNOSIS — M79672 Pain in left foot: Secondary | ICD-10-CM

## 2019-08-08 DIAGNOSIS — E11610 Type 2 diabetes mellitus with diabetic neuropathic arthropathy: Secondary | ICD-10-CM

## 2019-08-08 DIAGNOSIS — M79671 Pain in right foot: Secondary | ICD-10-CM

## 2019-08-08 DIAGNOSIS — S92301A Fracture of unspecified metatarsal bone(s), right foot, initial encounter for closed fracture: Secondary | ICD-10-CM

## 2019-08-08 NOTE — Progress Notes (Signed)
Subjective:   Patient ID: Jacqueline [name], female   DOB: 39 y.o.   MRN: [id number]   HPI Patient presents stating her right foot has been swollen for about 6 months now she is noted a knot on the outside and patient does have long-term diabetes that was in very poor control for years and she is lost over 100 pounds and is doing better and would like to be more active.  Patient does not smoke   Review of Systems  All other systems reviewed and are negative.       Objective:  Physical Exam Vitals and nursing note reviewed.  Constitutional:      Appearance: She is well-developed.  Pulmonary:     Effort: Pulmonary effort is normal.  Musculoskeletal:        General: Normal range of motion.  Skin:    General: Skin is warm.  Neurological:     Mental Status: She is alert.     Vascular status found to be intact with neurological significant diminishment right over left sharp dull vibratory noted.  Patient has significant edema the right forefoot extending into the ankle with soreness and has what appears to be a protuberance of bone on the lateral side of the right foot.  Left foot shows reasonable arch height     Assessment:  Probability for Charcot deformity right with left foot not showing these conditions currently     Plan:  H&P reviewed conditions discussed weight loss and the importance of this.  I went ahead today and I did apply air fracture walker because of the Charcot foot right and I did discuss that this may eventually require external fixator or may require AFO bracing.  Reappoint in 4 weeks to reevaluate and I educated her on diabetes and the importance of control  X-rays indicate that there is a severe Charcot foot right with complete fracture of the base of the metatarsals right with deviation in the lateral plane with the lateral being relatively stable and no indications that the cuboid is completely plantarflexed.  Left foot has reasonable arch with no indication of  fracture

## 2019-09-02 DIAGNOSIS — G4733 Obstructive sleep apnea (adult) (pediatric): Secondary | ICD-10-CM | POA: Diagnosis not present

## 2019-09-05 ENCOUNTER — Ambulatory Visit: Payer: BC Managed Care – PPO | Admitting: Podiatry

## 2019-09-06 ENCOUNTER — Ambulatory Visit: Payer: BC Managed Care – PPO | Admitting: Podiatry

## 2019-09-13 ENCOUNTER — Ambulatory Visit (INDEPENDENT_AMBULATORY_CARE_PROVIDER_SITE_OTHER): Payer: BC Managed Care – PPO

## 2019-09-13 ENCOUNTER — Encounter: Payer: Self-pay | Admitting: Podiatry

## 2019-09-13 ENCOUNTER — Ambulatory Visit: Payer: BC Managed Care – PPO | Admitting: Podiatry

## 2019-09-13 ENCOUNTER — Other Ambulatory Visit: Payer: Self-pay

## 2019-09-13 DIAGNOSIS — S92301A Fracture of unspecified metatarsal bone(s), right foot, initial encounter for closed fracture: Secondary | ICD-10-CM

## 2019-09-13 DIAGNOSIS — E11610 Type 2 diabetes mellitus with diabetic neuropathic arthropathy: Secondary | ICD-10-CM

## 2019-09-16 NOTE — Progress Notes (Signed)
Subjective:   Patient ID: Jacqueline [name], female   DOB: 39 y.o.   MRN: [id number]   HPI Patient states that her right foot is improved and the swelling is still present but seems better than previous and patient states that she is slowly getting out of the boot at this time   ROS      Objective:  Physical Exam  Neurovascular status intact with obese female history of diabetes with a Charcot foot structure right that has been immobilized and appears to be coalesced     Assessment:  Patient has reduced edema right foot with movement diminished discomfort with moderate edema still present right foot     Plan:  X-ray reviewed and explained and will take time for this to completely consolidate and boot usage is encouraged at the current time.  I did dispense ankle compression stocking and I reviewed Charcot foot with her at great length we discussed different considerations long-term for brace therapy orthotics or other treatment and I encouraged her on weight loss and also medications to try to control her diabetes  X-rays indicate that it appears there is been stabilization of the Charcot foot with healing still to occur to quite a significant nature

## 2019-09-30 DIAGNOSIS — G4733 Obstructive sleep apnea (adult) (pediatric): Secondary | ICD-10-CM | POA: Diagnosis not present

## 2019-10-01 ENCOUNTER — Emergency Department (HOSPITAL_COMMUNITY)
Admission: EM | Admit: 2019-10-01 | Discharge: 2019-10-01 | Disposition: A | Discharge status: Home / Self Care | Payer: BC Managed Care – PPO | Attending: Emergency Medicine | Admitting: Emergency Medicine

## 2019-10-01 ENCOUNTER — Emergency Department (HOSPITAL_COMMUNITY): Payer: BC Managed Care – PPO

## 2019-10-01 ENCOUNTER — Other Ambulatory Visit: Payer: Self-pay

## 2019-10-01 ENCOUNTER — Encounter (HOSPITAL_COMMUNITY): Payer: Self-pay | Admitting: Emergency Medicine

## 2019-10-01 DIAGNOSIS — J90 Pleural effusion, not elsewhere classified: Secondary | ICD-10-CM | POA: Diagnosis not present

## 2019-10-01 DIAGNOSIS — Z794 Long term (current) use of insulin: Secondary | ICD-10-CM | POA: Insufficient documentation

## 2019-10-01 DIAGNOSIS — I129 Hypertensive chronic kidney disease with stage 1 through stage 4 chronic kidney disease, or unspecified chronic kidney disease: Secondary | ICD-10-CM | POA: Insufficient documentation

## 2019-10-01 DIAGNOSIS — Z79899 Other long term (current) drug therapy: Secondary | ICD-10-CM | POA: Insufficient documentation

## 2019-10-01 DIAGNOSIS — R109 Unspecified abdominal pain: Secondary | ICD-10-CM | POA: Diagnosis not present

## 2019-10-01 DIAGNOSIS — R0602 Shortness of breath: Secondary | ICD-10-CM | POA: Diagnosis not present

## 2019-10-01 DIAGNOSIS — E1122 Type 2 diabetes mellitus with diabetic chronic kidney disease: Secondary | ICD-10-CM | POA: Insufficient documentation

## 2019-10-01 DIAGNOSIS — R399 Unspecified symptoms and signs involving the genitourinary system: Secondary | ICD-10-CM

## 2019-10-01 DIAGNOSIS — I10 Essential (primary) hypertension: Secondary | ICD-10-CM | POA: Diagnosis not present

## 2019-10-01 DIAGNOSIS — N183 Chronic kidney disease, stage 3 unspecified: Secondary | ICD-10-CM | POA: Insufficient documentation

## 2019-10-01 DIAGNOSIS — N390 Urinary tract infection, site not specified: Secondary | ICD-10-CM | POA: Diagnosis not present

## 2019-10-01 DIAGNOSIS — R1032 Left lower quadrant pain: Secondary | ICD-10-CM | POA: Diagnosis not present

## 2019-10-01 DIAGNOSIS — R3989 Other symptoms and signs involving the genitourinary system: Secondary | ICD-10-CM | POA: Diagnosis not present

## 2019-10-01 DIAGNOSIS — R110 Nausea: Secondary | ICD-10-CM | POA: Diagnosis not present

## 2019-10-01 LAB — BASIC METABOLIC PANEL
Anion gap: 9 (ref 5–15)
BUN: 33 mg/dL — ABNORMAL HIGH (ref 6–20)
CO2: 21 mmol/L — ABNORMAL LOW (ref 22–32)
Calcium: 8.3 mg/dL — ABNORMAL LOW (ref 8.9–10.3)
Chloride: 112 mmol/L — ABNORMAL HIGH (ref 98–111)
Creatinine, Ser: 2.18 mg/dL — ABNORMAL HIGH (ref 0.44–1.00)
GFR calc Af Amer: 32 mL/min — ABNORMAL LOW (ref >60)
GFR calc non Af Amer: 28 mL/min — ABNORMAL LOW (ref >60)
Glucose, Bld: 160 mg/dL — ABNORMAL HIGH (ref 70–99)
Potassium: 4.4 mmol/L (ref 3.5–5.1)
Sodium: 142 mmol/L (ref 135–145)

## 2019-10-01 LAB — URINALYSIS, ROUTINE W REFLEX MICROSCOPIC
Bilirubin Urine: NEGATIVE
Glucose, UA: 150 mg/dL — AB
Hgb urine dipstick: NEGATIVE
Ketones, ur: NEGATIVE mg/dL
Leukocytes,Ua: NEGATIVE
Nitrite: NEGATIVE
Protein, ur: >=300 mg/dL — AB
Specific Gravity, Urine: 1.014 (ref 1.005–1.030)
pH: 6 (ref 5.0–8.0)

## 2019-10-01 LAB — CBC
HCT: 29.3 % — ABNORMAL LOW (ref 36.0–46.0)
Hemoglobin: 9.1 g/dL — ABNORMAL LOW (ref 12.0–15.0)
MCH: 28.9 pg (ref 26.0–34.0)
MCHC: 31.1 g/dL (ref 30.0–36.0)
MCV: 93 fL (ref 80.0–100.0)
Platelets: 376 10*3/uL (ref 150–400)
RBC: 3.15 MIL/uL — ABNORMAL LOW (ref 3.87–5.11)
RDW: 14.1 % (ref 11.5–15.5)
WBC: 8.6 10*3/uL (ref 4.0–10.5)
nRBC: 0 % (ref 0.0–0.2)

## 2019-10-01 LAB — I-STAT BETA HCG BLOOD, ED (MC, WL, AP ONLY): I-stat hCG, quantitative: 6.2 m[IU]/mL — ABNORMAL HIGH (ref <5)

## 2019-10-01 LAB — PREGNANCY, URINE: Preg Test, Ur: NEGATIVE

## 2019-10-01 MED ORDER — ONDANSETRON HCL 4 MG/2ML IJ SOLN
4.0000 mg | Freq: Once | INTRAMUSCULAR | Status: AC
  Administered 2019-10-01: 4 mg via INTRAVENOUS
  Filled 2019-10-01: qty 2

## 2019-10-01 MED ORDER — IOHEXOL 350 MG/ML SOLN
65.0000 mL | Freq: Once | INTRAVENOUS | Status: AC | PRN
  Administered 2019-10-01: 65 mL via INTRAVENOUS

## 2019-10-01 MED ORDER — SODIUM CHLORIDE 0.9 % IV BOLUS
500.0000 mL | Freq: Once | INTRAVENOUS | Status: AC
  Administered 2019-10-01: 500 mL via INTRAVENOUS

## 2019-10-01 MED ORDER — CEPHALEXIN 500 MG PO CAPS: 500.0000 mg | ORAL_CAPSULE | Freq: Three times a day (TID) | ORAL | 0 refills | Status: AC

## 2019-10-01 MED ORDER — SODIUM CHLORIDE 0.9 % IV BOLUS
1000.0000 mL | Freq: Once | INTRAVENOUS | Status: AC
  Administered 2019-10-01: 1000 mL via INTRAVENOUS

## 2019-10-01 MED ORDER — HYDROMORPHONE HCL 1 MG/ML IJ SOLN
1.0000 mg | Freq: Once | INTRAMUSCULAR | Status: AC
  Administered 2019-10-01: 1 mg via INTRAVENOUS
  Filled 2019-10-01: qty 1

## 2019-10-01 MED ORDER — MORPHINE SULFATE (PF) 4 MG/ML IV SOLN
4.0000 mg | Freq: Once | INTRAVENOUS | Status: AC
  Administered 2019-10-01: 4 mg via INTRAVENOUS
  Filled 2019-10-01: qty 1

## 2019-10-01 MED ORDER — ACETAMINOPHEN 500 MG PO TABS: 500.0000 mg | ORAL_TABLET | Freq: Four times a day (QID) | ORAL | 0 refills | Status: DC | PRN

## 2019-10-01 NOTE — ED Triage Notes (Signed)
Pt reports L flank pain with radiation to her abdomen and dysuria that began yesterday. Denies hematuria or fevers, endorses chills.

## 2019-10-01 NOTE — ED Provider Notes (Signed)
[facility] EMERGENCY DEPARTMENT Provider Note   CSN: [id number] Arrival date & time: 10/01/19  0758     History Chief Complaint  Patient presents with  . Flank Pain    Jacqueline [name] is a 39 y.o. female with history of obesity, type 2 diabetes mellitus, CKD, anemia, anxiety, hypertension presents for evaluation of acute onset, progressively worsening left flank and abdominal pain that began yesterday afternoon.  Denies any known injury.  The pain is severe, sharp, radiates to the left side of the abdomen and left lower quadrant.  Has also noticed urinary urgency, frequency, and dysuria.  Denies hematuria.  She has had nausea but no vomiting.  She denies fevers, diarrhea, constipation, chest pain or shortness of breath.  Reports the pain worsens with movements, deep inspiration.  Denies vaginal itching, bleeding, or discharge.  Has not tried anything for her symptoms.  The history is provided by the patient.       Past Medical History:  Diagnosis Date  . Anemia   . Anxiety   . Chronic bronchitis ([place])   . Hypertension   . Increased frequency of headaches   . Morbid obesity ([place])   . Sleep apnea   . Type II diabetes mellitus [facility])     Patient Active Problem List   Diagnosis Date Noted  . Hypertension   . AKI (acute kidney injury) ([place])   . CKD (chronic kidney disease), stage III   . Obesity, Class III, BMI 40-49.9 (morbid obesity) ([place])   . Insulin dependent diabetes mellitus   . Anemia of chronic disease   . HLD (hyperlipidemia) 07/02/2018  . Acute on chronic diastolic CHF (congestive heart failure) ([place]) 07/02/2018  . Depression 07/02/2018  . OSA (obstructive sleep apnea) 07/02/2018  . Acute on chronic diastolic (congestive) heart failure ([place]) 07/02/2018  . Cellulitis of right lower extremity 07/02/2018  . Anemia 07/02/2018  . Type 2 diabetes mellitus with hyperglycemia, with long-term current use of insulin ([place]) 07/02/2018  . Chest pain 05/04/2018    . Hypertensive urgency 05/03/2018  . Left facial numbness 05/03/2018  . Headache 05/03/2018  . Elevated troponin 05/03/2018  . PAF (paroxysmal atrial fibrillation) ([place]) 05/25/2017  . Sepsis ([place]) 05/03/2017    Class: Present on Admission  . Acute renal failure superimposed on stage 3 chronic kidney disease ([place]) 05/03/2017    Class: Stage 3  . Neck pain   . Acidosis, metabolic   . Hyponatremia   . Type II diabetes mellitus with renal manifestations ([place]) 04/30/2017  . Normocytic anemia 04/30/2017  . Staphylococcus aureus bacteremia 03/15/2016  . Streptococcal bacteremia 03/15/2016  . Tachycardia 03/14/2016  . Fever 03/14/2016  . Bad headache 03/14/2016  . Morbid obesity ([place])   . Diabetes ([place]) 01/01/2014    Past Surgical History:  Procedure Laterality Date  . BARIATRIC SURGERY    . BREAST REDUCTION SURGERY  03/21/2017  . CARDIAC CATHETERIZATION  01/06/2016  . CARDIAC CATHETERIZATION N/A 01/06/2016   Procedure: Left Heart Cath and Coronary Angiography;  Surgeon: Wellington Hampshire, MD;  Location: [facility];  Service: Cardiovascular;  Laterality: N/A;  . CESAREAN SECTION  08/2014     OB History    Gravida  1   Para      Term      Preterm      AB      Living        SAB      TAB      Ectopic  Multiple      Live Births              Family History  Problem Relation Age of Onset  . Diabetes Mother   . Hypertension Mother   . Thyroid disease Mother   . Kidney disease Maternal Grandmother   . Diabetes Maternal Grandmother   . Heart attack Other     Social History   Tobacco Use  . Smoking status: Never Smoker  . Smokeless tobacco: Never Used  Substance Use Topics  . Alcohol use: No  . Drug use: No    Home Medications Prior to Admission medications   Medication Sig Start Date End Date Taking? Authorizing Provider  amLODipine (NORVASC) 10 MG tablet Take 1 tablet (10 mg total) by mouth daily. 07/08/19  Yes Nita Sells, MD   aspirin EC 81 MG EC tablet Take 1 tablet (81 mg total) by mouth daily. 07/08/19  Yes Nita Sells, MD  atorvastatin (LIPITOR) 10 MG tablet Take 10 mg by mouth daily.  03/08/18  Yes [provider]  carvedilol (COREG) 25 MG tablet Take 1 tablet (25 mg total) by mouth 2 (two) times daily with a meal. 05/06/18  Yes Lady Deutscher, MD  DULoxetine (CYMBALTA) 30 MG capsule Take 30 mg by mouth every morning. 05/21/19  Yes [provider]  furosemide (LASIX) 40 MG tablet Take 40 mg by mouth daily. 06/23/19  Yes [provider]  gabapentin (NEURONTIN) 100 MG capsule Take 100 mg by mouth 3 (three) times daily.    Yes [provider]  hydrALAZINE (APRESOLINE) 50 MG tablet Take 1 tablet (50 mg total) by mouth 3 (three) times daily. 07/07/19  Yes Nita Sells, MD  losartan (COZAAR) 25 MG tablet Take 25 mg by mouth daily.   Yes [provider]  sertraline (ZOLOFT) 100 MG tablet Take 100 mg by mouth at bedtime. 04/30/19  Yes [provider]  acetaminophen (TYLENOL) 500 MG tablet Take 1 tablet (500 mg total) by mouth every 6 (six) hours as needed. 10/01/19   Shamus Desantis A, PA-C  cephALEXin (KEFLEX) 500 MG capsule Take 1 capsule (500 mg total) by mouth 3 (three) times daily for 5 days. 10/01/19 10/06/19  Rodell Perna A, PA-C    Allergies    Patient has no known allergies.  Review of Systems   Review of Systems  Constitutional: Negative for fever.  Respiratory: Negative for shortness of breath.   Cardiovascular: Negative for chest pain.  Gastrointestinal: Positive for abdominal pain and nausea. Negative for constipation, diarrhea and vomiting.  Genitourinary: Positive for dysuria, flank pain, frequency and urgency. Negative for hematuria, vaginal bleeding, vaginal discharge and vaginal pain.  All other systems reviewed and are negative.   Physical Exam Updated Vital Signs BP (!) 170/89   Pulse 93   Temp 98.3 F (36.8 C) (Oral)   Resp  20   SpO2 100%   Physical Exam Vitals and nursing note reviewed.  Constitutional:      General: She is not in acute distress.    Appearance: She is well-developed. She is obese.     Comments: tearful  HENT:     Head: Normocephalic and atraumatic.  Eyes:     General:        Right eye: No discharge.        Left eye: No discharge.     Conjunctiva/sclera: Conjunctivae normal.  Neck:     Vascular: No JVD.     Trachea: No tracheal deviation.  Cardiovascular:     Rate and Rhythm: Normal rate and regular rhythm.     Heart sounds: Normal heart sounds.  Pulmonary:     Effort: Pulmonary effort is normal.     Breath sounds: Normal breath sounds.  Abdominal:     General: Abdomen is flat. Bowel sounds are normal. There is no distension.     Palpations: Abdomen is soft.     Tenderness: There is abdominal tenderness in the epigastric area, periumbilical area, suprapubic area, left upper quadrant and left lower quadrant. There is left CVA tenderness. There is no right CVA tenderness, guarding or rebound.  Musculoskeletal:     Comments: No midline lumbar spine tenderness, left paralumbar muscle tenderness  Skin:    General: Skin is warm and dry.     Findings: No erythema.  Neurological:     Mental Status: She is alert.  Psychiatric:        Behavior: Behavior normal.     ED Results / Procedures / Treatments   Labs (all labs ordered are listed, but only abnormal results are displayed) Labs Reviewed  URINE CULTURE - Abnormal; Notable for the following components:      Result Value   Culture >=100,000 COLONIES/mL GRAM NEGATIVE RODS (*)    All other components within normal limits  URINALYSIS, ROUTINE W REFLEX MICROSCOPIC - Abnormal; Notable for the following components:   APPearance HAZY (*)    Glucose, UA 150 (*)    Protein, ur >=300 (*)    Bacteria, UA MANY (*)    All other components within normal limits  BASIC METABOLIC PANEL - Abnormal; Notable for the following components:    Chloride 112 (*)    CO2 21 (*)    Glucose, Bld 160 (*)    BUN 33 (*)    Creatinine, Ser 2.18 (*)    Calcium 8.3 (*)    GFR calc non Af Amer 28 (*)    GFR calc Af Amer 32 (*)    All other components within normal limits  CBC - Abnormal; Notable for the following components:   RBC 3.15 (*)    Hemoglobin 9.1 (*)    HCT 29.3 (*)    All other components within normal limits  I-STAT BETA HCG BLOOD, ED ([facility], [facility], [facility] ONLY) - Abnormal; Notable for the following components:   I-stat hCG, quantitative 6.2 (*)    All other components within normal limits  PREGNANCY, URINE    EKG EKG Interpretation  Date/Time:  Tuesday October 01 2019 15:11:19 EDT Ventricular Rate:  91 PR Interval:  144 QRS Duration: 78 QT Interval:  372 QTC Calculation: 457 R Axis:   120 Text Interpretation: Normal sinus rhythm Right axis deviation Possible Anterior infarct , age undetermined Abnormal ECG since last tracing no significant change Confirmed by Noemi Chapel [phone]) on 10/01/2019 3:13:32 PM   Radiology CT Angio Chest PE W and/or Wo Contrast  Result Date: 10/01/2019 CLINICAL DATA:  Shortness of breath EXAM: CT ANGIOGRAPHY CHEST WITH CONTRAST TECHNIQUE: Multidetector CT imaging of the chest was performed using the standard protocol during bolus administration of intravenous contrast. Multiplanar CT image reconstructions and MIPs were obtained to evaluate the vascular anatomy. CONTRAST:  94mL OMNIPAQUE IOHEXOL 350 MG/ML SOLN COMPARISON:  2019 FINDINGS: Cardiovascular: Satisfactory opacification of the pulmonary arteries to the proximal segmental level. No evidence of pulmonary embolism. Mild cardiomegaly. Chronic trace pericardial effusion. Mediastinum/Nodes: There are no enlarged mediastinal, hilar, or axillary nodes. Thyroid is unremarkable. Lungs/Pleura: No consolidation or  mass. No pleural effusion or pneumothorax. Upper Abdomen: Post gastric bypass.  No acute abnormality. Musculoskeletal: No acute abnormality.  Review of the MIP images confirms the above findings. IMPRESSION: No evidence of acute pulmonary embolism or other acute findings. Mild cardiomegaly. Electronically Signed   By: Macy Mis M.D.   On: 10/01/2019 14:57   CT Renal Stone Study  Addendum Date: 10/01/2019   ADDENDUM REPORT: 10/01/2019 12:59 ADDENDUM: Add to IMPRESSION: Stable left adrenal adenoma. Electronically Signed   By: Lowella Grip III M.D.   On: 10/01/2019 12:59   Result Date: 10/01/2019 CLINICAL DATA:  Left flank pain EXAM: CT ABDOMEN AND PELVIS WITHOUT CONTRAST TECHNIQUE: Multidetector CT imaging of the abdomen and pelvis was performed following the standard protocol without oral or IV contrast. COMPARISON:  July 01, 2019 FINDINGS: Lower chest: There is mild atelectatic change in the left base with minimal left pleural effusion. There is a stable 3 mm nodular opacity in the lateral segment of the right middle lobe. Lung bases otherwise are clear. Hepatobiliary: No focal liver lesions are evident on this noncontrast enhanced study. Gallbladder wall is not appreciably thickened. There is no biliary duct dilatation. Pancreas: There is no pancreatic mass or inflammatory focus. Spleen: No splenic lesions are evident. Adrenals/Urinary Tract: Right adrenal appears normal. There is stable left adrenal adenoma measuring 1.9 x 1.8 cm. There is a cyst arising in the lateral right mid kidney measuring 1.8 x 1.6 cm. There is no evident hydronephrosis on either side. There is an apparent duplicated collecting system on the left. There is no evident renal or ureteral calculus on either side. Urinary bladder is midline with wall thickness within normal limits. Stomach/Bowel: There is evidence of previous gastric bypass procedure without evident fluid or wall thickening in the postoperative areas. Elsewhere, no bowel wall thickening or mesenteric thickening. No evident bowel obstruction. Terminal ileum appears normal. There is no evident free  air or portal venous air. Vascular/Lymphatic: There is no abdominal aortic aneurysm. No vascular lesions are evident on this noncontrast enhanced study. There is no adenopathy in the abdomen or pelvis. Reproductive: The uterus is midline.  No pelvic mass evident. Other: No evident periappendiceal region inflammation. No abscess or ascites in the abdomen or pelvis. There is slight fat in the umbilicus. There is scarring in the anterior abdominal wall without evident fluid in the abdominal wall. Musculoskeletal: There is degenerative change in the lower thoracic region with diffuse idiopathic skeletal hyperostosis. No blastic or lytic bone lesions. No intramuscular lesions. IMPRESSION: 1. No demonstrable renal or ureteral calculus. No hydronephrosis on either side. Urinary bladder wall thickness is normal. 2. Evidence of previous gastric bypass procedure without complicating features. No evident bowel obstruction. No abscess in the abdomen pelvis. No appendiceal region inflammation. 3. Diffuse idiopathic skeletal hyperostosis in the lower thoracic spine. 4. Minimal left pleural effusion. Stable 3 mm nodular opacity in the right middle lobe. No follow-up needed if patient is low-risk. Non-contrast chest CT can be considered in 12 months if patient is high-risk. This recommendation follows the consensus statement: Guidelines for Management of Incidental Pulmonary Nodules Detected on CT Images: From the Fleischner Society 2017; Radiology 2017; 284:228-243. Electronically Signed: By: Lowella Grip III M.D. On: 10/01/2019 12:16    Procedures Procedures (including critical care time)  Medications Ordered in ED Medications  morphine 4 MG/ML injection 4 mg (4 mg Intravenous Given 10/01/19 1008)  ondansetron (ZOFRAN) injection 4 mg (4 mg Intravenous Given 10/01/19 1008)  sodium chloride 0.9 %  bolus 1,000 mL (0 mLs Intravenous Stopped 10/01/19 1601)  HYDROmorphone (DILAUDID) injection 1 mg (1 mg Intravenous Given  10/01/19 1319)  iohexol (OMNIPAQUE) 350 MG/ML injection 65 mL (65 mLs Intravenous Contrast Given 10/01/19 1424)  sodium chloride 0.9 % bolus 500 mL (0 mLs Intravenous Stopped 10/01/19 1602)    ED Course  I have reviewed the triage vital signs and the nursing notes.  Pertinent labs & imaging results that were available during my care of the patient were reviewed by me and considered in my medical decision making (see chart for details).    MDM Rules/Calculators/A&P                      Patient presenting for evaluation of acute onset left flank pain radiating to the abdomen with associated urinary symptoms.  Patient is afebrile, persistently hypertensive in the ED, but somewhat labile and at her baseline per chart review.  No midline spine tenderness, no red flag signs concerning for cauda equina or spinal abscess.  No rebound or guarding noted on examination of the abdomen.  Will obtain lab work, CT renal stone study, give pain medicine and nausea medicine and reassess.  Lab work reviewed and interpreted by myself shows no leukocytosis, stable anemia, renal insufficiency at patient's baseline (though BUN is a little higher suggesting dehydration).  No metabolic derangements.  UA shows many bacteria and protein but no leukocytes or blood, is equivocal for UTI and does not suggest nephrolithiasis a renal stone study was obtained which shows a small left pleural effusion but no evidence of nephrolithiasis, pyelonephritis, hydronephrosis, or other acute surgical abdominal pathology.  On reevaluation patient reports that her pain is still 7 out of 10.  She is ambulated in the ED with stable SPO2 saturations but heart rate increased somewhat to the 100s.  Given her history of paroxysmal A. fib for which she only takes a baby aspirin I am concerned for the possibility of a PE.  We discussed the risks and benefits of obtaining imaging to rule out PE or pneumonia given her history of CKD.  She would like to  proceed with imaging. We will give her an additional fluid bolus and she will follow up with her PCP within 48 hours for recheck of her creatinine.  CTA of the chest shows no evidence of acute PE or other acute findings.  She has some mild cardiomegaly.  No evidence of dissection, cardiac tamponade, esophageal rupture.  Doubt MI.  On reevaluation the patient is resting comfortably, reports that she is feeling better.  We will cover her for UTI versus respiratory illness though I suspect UTI is more likely due to her symptoms.  Doubt ovarian torsion, TOA, ectopic pregnancy or PID in the absence of complaints.  Her point-of-care pregnancy test was mildly elevated but urine pregnancy is negative.  I suspect the point-of-care pregnancy test was a false positive.  Will start on a course of Keflex.  She understands to call her PCP to schedule follow-up within 48 hours for reevaluation of her symptoms and recheck of her kidney function.  Discussed strict ED return precautions. Patient verbalized understanding of and agreement with plan and is safe for discharge home at this time. Discussed with Dr. Tyrone Nine who agrees with assessment and plan at this time.   Final Clinical Impression(s) / ED Diagnoses Final diagnoses:  Acute left flank pain  UTI symptoms  Pleural effusion on left    Rx / DC Orders ED Discharge  Orders         Ordered    acetaminophen (TYLENOL) 500 MG tablet  Every 6 hours PRN     10/01/19 1531    cephALEXin (KEFLEX) 500 MG capsule  3 times daily     10/01/19 1531           [place] Forker, [place] A, PA-C 10/02/19 [place], [place], DO 10/02/19 [id number]

## 2019-10-01 NOTE — ED Notes (Signed)
Pt ambulated without difficulty. Gait steady and even. Oxygen saturations remained 99% throughout. Resting HR 93 and HR with ambulation 103. Pt denies feeling dizzy or shob.

## 2019-10-01 NOTE — ED Notes (Signed)
Patient Alert and oriented to baseline. Stable and ambulatory to baseline. Patient verbalized understanding of the discharge instructions.  Patient belongings were taken by the patient.   

## 2019-10-01 NOTE — Discharge Instructions (Addendum)
Please take all of your antibiotics until finished!   Take your antibiotics with food.  Common side effects of antibiotics include nausea, vomiting, abdominal discomfort, and diarrhea. You may help offset some of this with probiotics which you can buy or get in yogurt. Do not eat  or take the probiotics until 2 hours after your antibiotic.    Some studies suggest that certain antibiotics can reduce the efficacy of certain oral contraceptive pills (birth control), so please use additional contraceptives (condoms or other barrier method) while you are taking the antibiotics and for an additional 5 to 7 days afterwards if you are a female on these medications.  Take 1 to 2 tablets of Tylenol every 6 hours as needed for pain.  You can also apply ice or heat whichever feels best 20 minutes at a time a few times daily and do some gentle stretching to avoid muscle stiffness.  Plenty of fluids and get rest.  Call your PCP today to schedule follow-up for recheck of your kidney function within 48 hours and to reevaluate your symptoms and discuss your diagnoses.  Return to the emergency department if any concerning signs or symptoms develop such as severe shortness of breath, persistent chest pains, persistent vomiting, severe abdominal pains

## 2019-10-03 LAB — URINE CULTURE: Culture: >=100000 — AB

## 2019-10-23 DIAGNOSIS — N1832 Chronic kidney disease, stage 3b: Secondary | ICD-10-CM | POA: Diagnosis not present

## 2019-10-23 DIAGNOSIS — I10 Essential (primary) hypertension: Secondary | ICD-10-CM | POA: Diagnosis not present

## 2019-10-23 DIAGNOSIS — M25532 Pain in left wrist: Secondary | ICD-10-CM | POA: Diagnosis not present

## 2019-10-23 DIAGNOSIS — M19049 Primary osteoarthritis, unspecified hand: Secondary | ICD-10-CM | POA: Diagnosis not present

## 2019-10-24 ENCOUNTER — Ambulatory Visit: Payer: BC Managed Care – PPO | Attending: Internal Medicine

## 2019-10-24 DIAGNOSIS — Z23 Encounter for immunization: Secondary | ICD-10-CM

## 2019-10-24 NOTE — Progress Notes (Signed)
   Covid-19 Vaccination Clinic  Name:  Jacqueline [name]    MRN: [id number] DOB: [birth year]  10/24/2019  Jacqueline [name] was observed post Covid-19 immunization for 15 minutes without incident. She was provided with Vaccine Information Sheet and instruction to access the V-Safe system.   Jacqueline [name] was instructed to call 911 with any severe reactions post vaccine: Marland Kitchen Difficulty breathing  . Swelling of face and throat  . A fast heartbeat  . A bad rash all over body  . Dizziness and weakness   Immunizations Administered    Name Date Dose VIS Date Route   Pfizer COVID-19 Vaccine 10/24/2019  8:32 AM 0.3 mL 06/28/2019 Intramuscular   Manufacturer: [place]   Lot: [id number]   [place]: [id number]

## 2019-10-31 ENCOUNTER — Ambulatory Visit
Admission: RE | Admit: 2019-10-31 | Discharge: 2019-10-31 | Disposition: A | Discharge status: Home / Self Care | Payer: BC Managed Care – PPO | Source: Ambulatory Visit | Attending: Internal Medicine | Admitting: Internal Medicine

## 2019-10-31 ENCOUNTER — Other Ambulatory Visit: Payer: Self-pay

## 2019-10-31 ENCOUNTER — Other Ambulatory Visit: Payer: Self-pay | Admitting: Internal Medicine

## 2019-10-31 DIAGNOSIS — M25532 Pain in left wrist: Secondary | ICD-10-CM

## 2019-10-31 DIAGNOSIS — G4733 Obstructive sleep apnea (adult) (pediatric): Secondary | ICD-10-CM | POA: Diagnosis not present

## 2019-11-01 ENCOUNTER — Ambulatory Visit (INDEPENDENT_AMBULATORY_CARE_PROVIDER_SITE_OTHER): Payer: BC Managed Care – PPO | Admitting: Podiatry

## 2019-11-01 ENCOUNTER — Encounter: Payer: Self-pay | Admitting: Podiatry

## 2019-11-01 ENCOUNTER — Ambulatory Visit (INDEPENDENT_AMBULATORY_CARE_PROVIDER_SITE_OTHER): Payer: BC Managed Care – PPO

## 2019-11-01 ENCOUNTER — Telehealth: Payer: Self-pay | Admitting: *Deleted

## 2019-11-01 VITALS — Temp 97.1°F

## 2019-11-01 DIAGNOSIS — S92301D Fracture of unspecified metatarsal bone(s), right foot, subsequent encounter for fracture with routine healing: Secondary | ICD-10-CM | POA: Diagnosis not present

## 2019-11-01 DIAGNOSIS — S92301K Fracture of unspecified metatarsal bone(s), right foot, subsequent encounter for fracture with nonunion: Secondary | ICD-10-CM | POA: Diagnosis not present

## 2019-11-01 NOTE — Telephone Encounter (Signed)
Left message informing [facility] Dr. Paulla Dolly was ordering bone growth stimulator, currently waiting on today's clinical note. Prepared available clinicals, rx and demographics to call [facility], once 11/01/2019 is available.

## 2019-11-06 NOTE — Progress Notes (Addendum)
Subjective:   Patient ID: Jacqueline [name], female   DOB: 39 y.o.   MRN: [id number]   HPI Patient states the bone is gradually healing on my right foot but I am still getting swelling and pain in the compression stocking is helping me some but still gives me some discomfort if it is on too tight patient states she still having quite a bit of pain and she does have diabetes that she is concerned about from a healing standpoint   ROS      Objective:  Physical Exam  Neurovascular status unchanged negative Bevelyn Buckles' sign noted patient's right foot appears to be gradually healing with discomfort present upon deep palpation to the area of fracture but localized in its intensity.  Patient continues to experience discomfort of a significant range when palpated     Assessment:  Fracture of the right forefoot right continuing to heal but still exhibiting swelling moderate discomfort.  Patient's bone is not currently healing and continues to give her problems  concerned about Charcot     Plan:  H&P condition reviewed and at this point I recommended continued elevation compression and immobilization. Patient will gradually increase activity levels was given encouragement for shoe gear usage and techniques for ice therapy along with over-the-counter anti-inflammatories and topical anti-inflammatories as needed with education about both given to patient. Patient will be seen back 6 weeks or earlier if needed.  Have recommended bone stimulator to try to encourage healing and prevent Charcot foot. 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

## 2019-11-13 ENCOUNTER — Telehealth: Payer: Self-pay | Admitting: *Deleted

## 2019-11-13 NOTE — Telephone Encounter (Signed)
[facility] request clinicals of 11/01/2019 to make statement of the fracture is not healing and that multiple view x-ray indicate the fracture is non-union.

## 2019-11-14 NOTE — Telephone Encounter (Signed)
Valery:  I gave this to Dr. Paulla Dolly and showed him what he needed to add to his note.  Thank you,  Rolly Pancake, CMA (AAMA)

## 2019-11-15 NOTE — Telephone Encounter (Signed)
Faxed 11/01/2019 clinical notes to [facility].

## 2019-11-18 ENCOUNTER — Ambulatory Visit: Payer: BC Managed Care – PPO | Attending: Internal Medicine

## 2019-11-18 DIAGNOSIS — Z23 Encounter for immunization: Secondary | ICD-10-CM

## 2019-11-18 NOTE — Progress Notes (Signed)
   Covid-19 Vaccination Clinic  Name:  Jacqueline [name]    MRN: [id number] DOB: 26-May-1981  11/18/2019  [name] was observed post Covid-19 immunization for 15 minutes without incident. She was provided with Vaccine Information Sheet and instruction to access the V-Safe system.   [name] was instructed to call 911 with any severe reactions post vaccine: Marland Kitchen Difficulty breathing  . Swelling of face and throat  . A fast heartbeat  . A bad rash all over body  . Dizziness and weakness   Immunizations Administered    Name Date Dose VIS Date Route   Pfizer COVID-19 Vaccine 11/18/2019  8:10 AM 0.3 mL 09/11/2018 Intramuscular   Manufacturer: [place]   Lot: [id number]   [place]: [id number]

## 2019-11-20 NOTE — Telephone Encounter (Signed)
Faxed 11/01/2019 clinical notes to [facility] after 11/19/2019 up date.

## 2019-12-02 ENCOUNTER — Emergency Department (HOSPITAL_COMMUNITY): Payer: BC Managed Care – PPO

## 2019-12-02 ENCOUNTER — Observation Stay (HOSPITAL_COMMUNITY)
Admission: EM | Admit: 2019-12-02 | Discharge: 2019-12-03 | Disposition: A | Discharge status: Home / Self Care | Payer: BC Managed Care – PPO | Attending: Internal Medicine | Admitting: Internal Medicine

## 2019-12-02 ENCOUNTER — Encounter (HOSPITAL_COMMUNITY): Payer: Self-pay | Admitting: Emergency Medicine

## 2019-12-02 ENCOUNTER — Other Ambulatory Visit: Payer: Self-pay

## 2019-12-02 DIAGNOSIS — Z6841 Body Mass Index (BMI) 40.0 and over, adult: Secondary | ICD-10-CM | POA: Insufficient documentation

## 2019-12-02 DIAGNOSIS — D638 Anemia in other chronic diseases classified elsewhere: Secondary | ICD-10-CM | POA: Insufficient documentation

## 2019-12-02 DIAGNOSIS — E1142 Type 2 diabetes mellitus with diabetic polyneuropathy: Secondary | ICD-10-CM | POA: Diagnosis present

## 2019-12-02 DIAGNOSIS — N183 Chronic kidney disease, stage 3 unspecified: Secondary | ICD-10-CM

## 2019-12-02 DIAGNOSIS — Z8616 Personal history of COVID-19: Secondary | ICD-10-CM | POA: Insufficient documentation

## 2019-12-02 DIAGNOSIS — N1832 Chronic kidney disease, stage 3b: Secondary | ICD-10-CM | POA: Diagnosis not present

## 2019-12-02 DIAGNOSIS — E785 Hyperlipidemia, unspecified: Secondary | ICD-10-CM | POA: Insufficient documentation

## 2019-12-02 DIAGNOSIS — Z9884 Bariatric surgery status: Secondary | ICD-10-CM | POA: Insufficient documentation

## 2019-12-02 DIAGNOSIS — G4733 Obstructive sleep apnea (adult) (pediatric): Secondary | ICD-10-CM | POA: Insufficient documentation

## 2019-12-02 DIAGNOSIS — Z79899 Other long term (current) drug therapy: Secondary | ICD-10-CM | POA: Insufficient documentation

## 2019-12-02 DIAGNOSIS — F419 Anxiety disorder, unspecified: Secondary | ICD-10-CM | POA: Insufficient documentation

## 2019-12-02 DIAGNOSIS — I160 Hypertensive urgency: Principal | ICD-10-CM | POA: Diagnosis present

## 2019-12-02 DIAGNOSIS — N185 Chronic kidney disease, stage 5: Secondary | ICD-10-CM

## 2019-12-02 DIAGNOSIS — R531 Weakness: Secondary | ICD-10-CM | POA: Diagnosis not present

## 2019-12-02 DIAGNOSIS — I130 Hypertensive heart and chronic kidney disease with heart failure and stage 1 through stage 4 chronic kidney disease, or unspecified chronic kidney disease: Secondary | ICD-10-CM | POA: Insufficient documentation

## 2019-12-02 DIAGNOSIS — G43909 Migraine, unspecified, not intractable, without status migrainosus: Secondary | ICD-10-CM | POA: Diagnosis not present

## 2019-12-02 DIAGNOSIS — E1122 Type 2 diabetes mellitus with diabetic chronic kidney disease: Secondary | ICD-10-CM | POA: Insufficient documentation

## 2019-12-02 DIAGNOSIS — I480 Paroxysmal atrial fibrillation: Secondary | ICD-10-CM | POA: Insufficient documentation

## 2019-12-02 DIAGNOSIS — R42 Dizziness and giddiness: Secondary | ICD-10-CM | POA: Diagnosis not present

## 2019-12-02 DIAGNOSIS — I5032 Chronic diastolic (congestive) heart failure: Secondary | ICD-10-CM | POA: Insufficient documentation

## 2019-12-02 DIAGNOSIS — R519 Headache, unspecified: Secondary | ICD-10-CM | POA: Diagnosis not present

## 2019-12-02 DIAGNOSIS — E1129 Type 2 diabetes mellitus with other diabetic kidney complication: Secondary | ICD-10-CM | POA: Diagnosis present

## 2019-12-02 DIAGNOSIS — G8191 Hemiplegia, unspecified affecting right dominant side: Secondary | ICD-10-CM

## 2019-12-02 LAB — URINALYSIS, ROUTINE W REFLEX MICROSCOPIC
Bilirubin Urine: NEGATIVE
Glucose, UA: 150 mg/dL — AB
Hgb urine dipstick: NEGATIVE
Ketones, ur: NEGATIVE mg/dL
Leukocytes,Ua: NEGATIVE
Nitrite: NEGATIVE
Protein, ur: >=300 mg/dL — AB
Specific Gravity, Urine: 1.014 (ref 1.005–1.030)
pH: 6 (ref 5.0–8.0)

## 2019-12-02 LAB — RAPID URINE DRUG SCREEN, HOSP PERFORMED
Amphetamines: NOT DETECTED
Barbiturates: NOT DETECTED
Benzodiazepines: NOT DETECTED
Cocaine: NOT DETECTED
Opiates: NOT DETECTED
Tetrahydrocannabinol: NOT DETECTED

## 2019-12-02 LAB — DIFFERENTIAL
Abs Immature Granulocytes: 0.05 10*3/uL (ref 0.00–0.07)
Basophils Absolute: 0.1 10*3/uL (ref 0.0–0.1)
Basophils Relative: 1 %
Eosinophils Absolute: 0.3 10*3/uL (ref 0.0–0.5)
Eosinophils Relative: 4 %
Immature Granulocytes: 1 %
Lymphocytes Relative: 35 %
Lymphs Abs: 3 10*3/uL (ref 0.7–4.0)
Monocytes Absolute: 0.5 10*3/uL (ref 0.1–1.0)
Monocytes Relative: 6 %
Neutro Abs: 4.5 10*3/uL (ref 1.7–7.7)
Neutrophils Relative %: 53 %

## 2019-12-02 LAB — CBC
HCT: 29.4 % — ABNORMAL LOW (ref 36.0–46.0)
Hemoglobin: 9.7 g/dL — ABNORMAL LOW (ref 12.0–15.0)
MCH: 29.8 pg (ref 26.0–34.0)
MCHC: 33 g/dL (ref 30.0–36.0)
MCV: 90.2 fL (ref 80.0–100.0)
Platelets: 306 10*3/uL (ref 150–400)
RBC: 3.26 MIL/uL — ABNORMAL LOW (ref 3.87–5.11)
RDW: 13.2 % (ref 11.5–15.5)
WBC: 8.4 10*3/uL (ref 4.0–10.5)
nRBC: 0 % (ref 0.0–0.2)

## 2019-12-02 LAB — COMPREHENSIVE METABOLIC PANEL
ALT: 27 U/L (ref 0–44)
AST: 32 U/L (ref 15–41)
Albumin: 2.9 g/dL — ABNORMAL LOW (ref 3.5–5.0)
Alkaline Phosphatase: 97 U/L (ref 38–126)
Anion gap: 7 (ref 5–15)
BUN: 35 mg/dL — ABNORMAL HIGH (ref 6–20)
CO2: 21 mmol/L — ABNORMAL LOW (ref 22–32)
Calcium: 8.3 mg/dL — ABNORMAL LOW (ref 8.9–10.3)
Chloride: 115 mmol/L — ABNORMAL HIGH (ref 98–111)
Creatinine, Ser: 2.14 mg/dL — ABNORMAL HIGH (ref 0.44–1.00)
GFR calc Af Amer: 33 mL/min — ABNORMAL LOW (ref >60)
GFR calc non Af Amer: 28 mL/min — ABNORMAL LOW (ref >60)
Glucose, Bld: 171 mg/dL — ABNORMAL HIGH (ref 70–99)
Potassium: 4.2 mmol/L (ref 3.5–5.1)
Sodium: 143 mmol/L (ref 135–145)
Total Bilirubin: 0.7 mg/dL (ref 0.3–1.2)
Total Protein: 6.4 g/dL — ABNORMAL LOW (ref 6.5–8.1)

## 2019-12-02 LAB — APTT: aPTT: 23 seconds — ABNORMAL LOW (ref 24–36)

## 2019-12-02 LAB — PROTIME-INR
INR: 1.1 (ref 0.8–1.2)
Prothrombin Time: 13.7 seconds (ref 11.4–15.2)

## 2019-12-02 LAB — HCG, SERUM, QUALITATIVE: Preg, Serum: NEGATIVE

## 2019-12-02 MED ORDER — HYDRALAZINE HCL 20 MG/ML IJ SOLN
20.0000 mg | Freq: Once | INTRAMUSCULAR | Status: AC
  Administered 2019-12-02: 20 mg via INTRAVENOUS
  Filled 2019-12-02: qty 1

## 2019-12-02 MED ORDER — GABAPENTIN 100 MG PO CAPS
100.0000 mg | ORAL_CAPSULE | Freq: Three times a day (TID) | ORAL | Status: DC
  Administered 2019-12-02 – 2019-12-03 (×4): 100 mg via ORAL
  Filled 2019-12-02 (×4): qty 1

## 2019-12-02 MED ORDER — LABETALOL HCL 5 MG/ML IV SOLN: 10.0000 mg | INTRAVENOUS | Status: DC | PRN

## 2019-12-02 MED ORDER — PROCHLORPERAZINE EDISYLATE 10 MG/2ML IJ SOLN
10.0000 mg | Freq: Once | INTRAMUSCULAR | Status: AC
  Administered 2019-12-02: 10 mg via INTRAVENOUS
  Filled 2019-12-02: qty 2

## 2019-12-02 MED ORDER — ACETAMINOPHEN 325 MG PO TABS
650.0000 mg | ORAL_TABLET | Freq: Four times a day (QID) | ORAL | Status: DC | PRN
  Administered 2019-12-02: 650 mg via ORAL
  Filled 2019-12-02: qty 2

## 2019-12-02 MED ORDER — AMLODIPINE BESYLATE 10 MG PO TABS
10.0000 mg | ORAL_TABLET | Freq: Every day | ORAL | Status: DC
  Administered 2019-12-02 – 2019-12-03 (×2): 10 mg via ORAL
  Filled 2019-12-02: qty 1
  Filled 2019-12-02: qty 2

## 2019-12-02 MED ORDER — FUROSEMIDE 40 MG PO TABS
40.0000 mg | ORAL_TABLET | Freq: Every day | ORAL | Status: DC
  Administered 2019-12-02 – 2019-12-03 (×2): 40 mg via ORAL
  Filled 2019-12-02 (×2): qty 1

## 2019-12-02 MED ORDER — SERTRALINE HCL 100 MG PO TABS
100.0000 mg | ORAL_TABLET | Freq: Every day | ORAL | Status: DC
  Administered 2019-12-02: 100 mg via ORAL
  Filled 2019-12-02 (×2): qty 1

## 2019-12-02 MED ORDER — ATORVASTATIN CALCIUM 10 MG PO TABS
10.0000 mg | ORAL_TABLET | Freq: Every day | ORAL | Status: DC
  Administered 2019-12-02 – 2019-12-03 (×2): 10 mg via ORAL
  Filled 2019-12-02 (×2): qty 1

## 2019-12-02 MED ORDER — DIPHENHYDRAMINE HCL 50 MG/ML IJ SOLN
50.0000 mg | Freq: Once | INTRAMUSCULAR | Status: AC
  Administered 2019-12-02: 50 mg via INTRAVENOUS
  Filled 2019-12-02: qty 1

## 2019-12-02 MED ORDER — CARVEDILOL 25 MG PO TABS
25.0000 mg | ORAL_TABLET | Freq: Two times a day (BID) | ORAL | Status: DC
  Administered 2019-12-02 – 2019-12-03 (×3): 25 mg via ORAL
  Filled 2019-12-02 (×3): qty 1

## 2019-12-02 MED ORDER — HYDRALAZINE HCL 50 MG PO TABS
50.0000 mg | ORAL_TABLET | Freq: Three times a day (TID) | ORAL | Status: DC
  Administered 2019-12-02 – 2019-12-03 (×4): 50 mg via ORAL
  Filled 2019-12-02 (×5): qty 1

## 2019-12-02 MED ORDER — KETOROLAC TROMETHAMINE 30 MG/ML IJ SOLN
30.0000 mg | Freq: Once | INTRAMUSCULAR | Status: AC
  Administered 2019-12-02: 30 mg via INTRAVENOUS
  Filled 2019-12-02: qty 1

## 2019-12-02 MED ORDER — CARVEDILOL 12.5 MG PO TABS
25.0000 mg | ORAL_TABLET | Freq: Once | ORAL | Status: AC
  Administered 2019-12-02: 25 mg via ORAL
  Filled 2019-12-02: qty 2

## 2019-12-02 MED ORDER — HYDRALAZINE HCL 25 MG PO TABS
50.0000 mg | ORAL_TABLET | Freq: Once | ORAL | Status: AC
  Administered 2019-12-02: 50 mg via ORAL
  Filled 2019-12-02: qty 2

## 2019-12-02 MED ORDER — BUTALBITAL-APAP-CAFFEINE 50-325-40 MG PO TABS
1.0000 | ORAL_TABLET | ORAL | Status: DC | PRN
  Administered 2019-12-02: 1 via ORAL
  Filled 2019-12-02: qty 1

## 2019-12-02 MED ORDER — AMLODIPINE BESYLATE 5 MG PO TABS
10.0000 mg | ORAL_TABLET | Freq: Once | ORAL | Status: AC
  Administered 2019-12-02: 10 mg via ORAL
  Filled 2019-12-02: qty 2

## 2019-12-02 MED ORDER — HEPARIN SODIUM (PORCINE) 5000 UNIT/ML IJ SOLN
5000.0000 [IU] | Freq: Three times a day (TID) | INTRAMUSCULAR | Status: DC
  Administered 2019-12-02 – 2019-12-03 (×4): 5000 [IU] via SUBCUTANEOUS
  Filled 2019-12-02 (×5): qty 1

## 2019-12-02 NOTE — ED Provider Notes (Signed)
[facility] DEPT Provider Note   CSN: [id number] Arrival date & time: 12/02/19  0759     History Chief Complaint  Patient presents with  . Dizziness  . Headache  . Weakness    Jacqueline [name] is a 39 y.o. female.  HPI Patient with history of hypertension, diabetes, paroxysmal A. fib.  Patient reports on Friday, 3 days ago she started to notice that she was feeling mildly confused and lightheaded.  She reports over the weekend her son kept saying, "do you understand".  Patient has history of significant hypertension.  She reports she does check her pressures regularly and keeps them logged into her BP monitor.  Last week they were normal running in the 1 teens to 120s.  Over the weekend she started getting intermittent high readings up to 190s.  Patient reports she is compliant with her medications.  She reports she takes her medications when she gets her son his medications and does not think that she missed any over the weekend.  She reports yesterday when she woke up she felt lightheaded and at some point, at least by noon, she felt that she had some perception of numbness on the right side of her face and right arm and leg.  She reports she was asking her husband to help her a lot more with activities because she just felt like she was incoordinated or did not have the strength.  She reports her vision has been blurred over the weekend.  Its been waxing and waning in severity.  No double vision.  She also reports he has had a headache that has waxed and waned over the weekend.  She reports at times will be intense and throbbing and then it would ease off and resolved spontaneously.  No associated vomiting.  Patient reports she did not take her blood pressure medications this morning.  She became very worried about the symptoms, particularly the numbness she is experiencing on the right side and came to the emergency department.  She has not had any fevers or chills.   No cough.  Covid vaccination completed last dose greater than 2 weeks.    Past Medical History:  Diagnosis Date  . Anemia   . Anxiety   . Chronic bronchitis ([place])   . Hypertension   . Increased frequency of headaches   . Morbid obesity ([place])   . Sleep apnea   . Type II diabetes mellitus [facility])     Patient Active Problem List   Diagnosis Date Noted  . Hypertension   . AKI (acute kidney injury) ([place])   . CKD (chronic kidney disease), stage III   . Obesity, Class III, BMI 40-49.9 (morbid obesity) ([place])   . Insulin dependent diabetes mellitus   . Anemia of chronic disease   . HLD (hyperlipidemia) 07/02/2018  . Acute on chronic diastolic CHF (congestive heart failure) ([place]) 07/02/2018  . Depression 07/02/2018  . OSA (obstructive sleep apnea) 07/02/2018  . Acute on chronic diastolic (congestive) heart failure ([place]) 07/02/2018  . Cellulitis of right lower extremity 07/02/2018  . Anemia 07/02/2018  . Type 2 diabetes mellitus with hyperglycemia, with long-term current use of insulin ([place]) 07/02/2018  . Chest pain 05/04/2018  . Hypertensive urgency 05/03/2018  . Left facial numbness 05/03/2018  . Headache 05/03/2018  . Elevated troponin 05/03/2018  . Hyperlipidemia associated with type 2 diabetes mellitus ([place]) 03/08/2018  . Hypertension associated with stage 3 chronic kidney disease due to type 2 diabetes mellitus ([place])  03/08/2018  . Vitamin D deficiency 12/21/2017  . Generalized anxiety disorder 08/07/2017  . Diabetic peripheral neuropathy ([place]) 06/21/2017  . History of asthma 06/21/2017  . Steatosis of liver 06/21/2017  . Microalbuminuric diabetic nephropathy ([place]) 06/01/2017  . Raised TSH level 06/01/2017  . Thrombocytosis ([place]) 06/01/2017  . Morbid obesity with BMI of 40.0-44.9, adult ([place]) 05/30/2017  . Allergic rhinitis 05/30/2017  . PAF (paroxysmal atrial fibrillation) ([place]) 05/25/2017  . Sepsis ([place]) 05/03/2017    Class: Present on Admission  . Acute renal failure  superimposed on stage 3 chronic kidney disease ([place]) 05/03/2017    Class: Stage 3  . Neck pain   . Acidosis, metabolic   . Hyponatremia   . Type II diabetes mellitus with renal manifestations ([place]) 04/30/2017  . Normocytic anemia 04/30/2017  . Staphylococcus aureus bacteremia 03/15/2016  . Streptococcal bacteremia 03/15/2016  . Tachycardia 03/14/2016  . Fever 03/14/2016  . Bad headache 03/14/2016  . Morbid obesity ([place])   . Diabetes ([place]) 01/01/2014    Past Surgical History:  Procedure Laterality Date  . BARIATRIC SURGERY    . BREAST REDUCTION SURGERY  03/21/2017  . CARDIAC CATHETERIZATION  01/06/2016  . CARDIAC CATHETERIZATION N/A 01/06/2016   Procedure: Left Heart Cath and Coronary Angiography;  Surgeon: Wellington Hampshire, MD;  Location: [facility];  Service: Cardiovascular;  Laterality: N/A;  . CESAREAN SECTION  08/2014     OB History    Gravida  1   Para      Term      Preterm      AB      Living        SAB      TAB      Ectopic      Multiple      Live Births              Family History  Problem Relation Age of Onset  . Diabetes Mother   . Hypertension Mother   . Thyroid disease Mother   . Kidney disease Maternal Grandmother   . Diabetes Maternal Grandmother   . Heart attack Other     Social History   Tobacco Use  . Smoking status: Never Smoker  . Smokeless tobacco: Never Used  Substance Use Topics  . Alcohol use: No  . Drug use: No    Home Medications Prior to Admission medications   Medication Sig Start Date End Date Taking? Authorizing Provider  amLODipine (NORVASC) 10 MG tablet Take 1 tablet (10 mg total) by mouth daily. 07/08/19  Yes Nita Sells, MD  atorvastatin (LIPITOR) 10 MG tablet Take 10 mg by mouth daily.  03/08/18  Yes [provider]  carvedilol (COREG) 25 MG tablet Take 1 tablet (25 mg total) by mouth 2 (two) times daily with a meal. 05/06/18  Yes Lady Deutscher, MD  diclofenac Sodium  (VOLTAREN) 1 % GEL Apply 2 g topically 4 (four) times daily as needed (pain).    Yes [provider]  furosemide (LASIX) 40 MG tablet Take 40 mg by mouth daily. 06/23/19  Yes [provider]  gabapentin (NEURONTIN) 100 MG capsule Take 100 mg by mouth 3 (three) times daily.    Yes [provider]  hydrALAZINE (APRESOLINE) 50 MG tablet Take 1 tablet (50 mg total) by mouth 3 (three) times daily. 07/07/19  Yes Nita Sells, MD  sertraline (ZOLOFT) 100 MG tablet Take 100 mg by mouth at bedtime. 04/30/19  Yes [provider]  acetaminophen (TYLENOL) 500 MG tablet Take 1 tablet (500 mg total) by mouth every 6 (six) hours as needed. Patient not taking: Reported on 12/02/2019 10/01/19   Renita Papa, PA-C  aspirin EC 81 MG EC tablet Take 1 tablet (81 mg total) by mouth daily. Patient not taking: Reported on 12/02/2019 07/08/19   Nita Sells, MD    Allergies    Patient has no known allergies.  Review of Systems   Review of Systems 10 Systems reviewed and are negative for acute change except as noted in the HPI.  Physical Exam Updated Vital Signs BP (!) 172/82 (BP Location: Left Arm)   Pulse 88   Temp 98.9 F (37.2 C) (Oral)   Resp 17   LMP 11/05/2019   SpO2 100%   Physical Exam Constitutional:      Comments: Patient is alert and nontoxic.  Clinically well in appearance.  As I enter the room she is standing at the bedside changing out of her regular clothes into her gown.  Movements are coordinated purposeful and symmetric.  HENT:     Head: Normocephalic and atraumatic.     Nose: Nose normal.     Mouth/Throat:     Mouth: Mucous membranes are moist.     Pharynx: Oropharynx is clear.  Eyes:     Extraocular Movements: Extraocular movements intact.     Conjunctiva/sclera: Conjunctivae normal.     Pupils: Pupils are equal, round, and reactive to light.  Cardiovascular:     Rate and Rhythm: Normal rate and regular rhythm.     Pulses: Normal  pulses.     Heart sounds: Normal heart sounds.  Pulmonary:     Effort: Pulmonary effort is normal.     Breath sounds: Normal breath sounds.  Abdominal:     General: There is no distension.     Palpations: Abdomen is soft.     Tenderness: There is no abdominal tenderness. There is no guarding.  Musculoskeletal:        General: No swelling. Normal range of motion.     Cervical back: Neck supple.     Right lower leg: No edema.     Left lower leg: No edema.  Skin:    General: Skin is warm and dry.  Neurological:     Comments: Patient is alert and appropriate.  Cognitive function normal.  Speech clear and normal.  Content normal.  With grip strength testing, patient is exhibiting less grip on the right, 3\5 as opposed to left 5\5.  She endorses decreased sensation to light touch on the right upper extremity.  Patient can elevate left upper extremity and hold against resistance.  Patient exhibits increased effort to elevate right lower extremity off of the bed but is able to do so and hold against resistance.  Patient endorses decreased sensation to light touch on the right as compared to the left.  With cranial nerve testing patient exhibits a questionable right facial droop but also appears to be shifting the mandible laterally when performing her cranial nerve testing.     ED Results / Procedures / Treatments   Labs (all labs ordered are listed, but only abnormal results are displayed) Labs Reviewed  APTT - Abnormal; Notable for the following components:      Result Value   aPTT 23 (*)    All other components within normal limits  CBC - Abnormal; Notable for the following components:   RBC 3.26 (*)    Hemoglobin 9.7 (*)  HCT 29.4 (*)    All other components within normal limits  COMPREHENSIVE METABOLIC PANEL - Abnormal; Notable for the following components:   Chloride 115 (*)    CO2 21 (*)    Glucose, Bld 171 (*)    BUN 35 (*)    Creatinine, Ser 2.14 (*)    Calcium 8.3 (*)     Total Protein 6.4 (*)    Albumin 2.9 (*)    GFR calc non Af Amer 28 (*)    GFR calc Af Amer 33 (*)    All other components within normal limits  URINALYSIS, ROUTINE W REFLEX MICROSCOPIC - Abnormal; Notable for the following components:   Glucose, UA 150 (*)    Protein, ur >=300 (*)    Bacteria, UA MANY (*)    All other components within normal limits  PROTIME-INR  DIFFERENTIAL  RAPID URINE DRUG SCREEN, HOSP PERFORMED  HCG, SERUM, QUALITATIVE    EKG EKG Interpretation  Date/Time:  Monday Dec 02 2019 08:39:37 EDT Ventricular Rate:  85 PR Interval:    QRS Duration: 78 QT Interval:  367 QTC Calculation: 437 R Axis:   56 Text Interpretation: Sinus rhythm no acute ischemic appearance. normalization of t wave inversion III, otherwise no change from previous Confirmed by Charlesetta Shanks [phone]) on 12/02/2019 8:46:13 AM   Radiology MR BRAIN WO CONTRAST  Result Date: 12/02/2019 CLINICAL DATA:  Neuro deficit, subacute. Additional history provided: Patient reports dizziness, headache, right-sided weakness. EXAM: MRI HEAD WITHOUT CONTRAST TECHNIQUE: Multiplanar, multiecho pulse sequences of the brain and surrounding structures were obtained without intravenous contrast. COMPARISON:  Non-contrast head CT performed earlier the same day 12/02/2019, brain MRI 06/24/2019 FINDINGS: Brain: Stable ill-defined T2/FLAIR hyperintensity within the pons with additional tiny scattered foci of T2/FLAIR hyperintensity within the cerebral white matter. Findings are nonspecific but likely reflect chronic small vessel ischemic changes given the history of hypertension and type 2 diabetes. There is no acute infarct. No evidence of intracranial mass. No chronic intracranial blood products. No extra-axial fluid collection. No midline shift. Vascular: Expected proximal arterial flow voids. Skull and upper cervical spine: No focal marrow lesion. Sinuses/Orbits: Visualized orbits show no acute finding. Minimal ethmoid  sinus mucosal thickening. Trace fluid within the right mastoid air cells. IMPRESSION: 1. No evidence of acute intracranial abnormality, including acute infarction. 2. Stable scattered T2 hyperintensity within the pons and cerebral white matter which is nonspecific, but likely reflects chronic small vessel ischemic changes given the history of hypertension and type 2 diabetes mellitus. 3. Minimal ethmoid sinus mucosal thickening. 4. Trace fluid within right mastoid air cells. Electronically Signed   By: Kellie Simmering DO   On: 12/02/2019 12:13   CT HEAD CODE STROKE WO CONTRAST  Result Date: 12/02/2019 CLINICAL DATA:  Code stroke. Right-sided weakness and numbness, confusion, blurred vision, and intermittent headache. EXAM: CT HEAD WITHOUT CONTRAST TECHNIQUE: Contiguous axial images were obtained from the base of the skull through the vertex without intravenous contrast. COMPARISON:  Head CT and MRI 06/24/2019 FINDINGS: Brain: There is no evidence of acute infarct, intracranial hemorrhage, mass, midline shift, or extra-axial fluid collection. The ventricles and sulci are normal. Vascular: No hyperdense vessel. Skull: No fracture or suspicious osseous lesion. Sinuses/Orbits: Visualized paranasal sinuses and mastoid air cells are clear. Unremarkable orbits. Other: None. ASPECTS [facility] Stroke Program Early CT Score) - Ganglionic level infarction (caudate, lentiform nuclei, internal capsule, insula, M1-M3 cortex): 7 - Supraganglionic infarction (M4-M6 cortex): 3 Total score (0-10 with 10 being normal): 10 IMPRESSION:  Negative head CT.  ASPECTS is 10. These results were called by telephone at the time of interpretation on 12/02/2019 at 9:07 am to Dr. Charlesetta Shanks, who verbally acknowledged these results. Electronically Signed   By: Logan Bores M.D.   On: 12/02/2019 09:08    Procedures Procedures (including critical care time) CRITICAL CARE Performed by: Charlesetta Shanks   Total critical care time: 30  minutes  Critical care time was exclusive of separately billable procedures and treating other patients.  Critical care was necessary to treat or prevent imminent or life-threatening deterioration.  Critical care was time spent personally by me on the following activities: development of treatment plan with patient and/or surrogate as well as nursing, discussions with consultants, evaluation of patient's response to treatment, examination of patient, obtaining history from patient or surrogate, ordering and performing treatments and interventions, ordering and review of laboratory studies, ordering and review of radiographic studies, pulse oximetry and re-evaluation of patient's condition. Medications Ordered in ED Medications  hydrALAZINE (APRESOLINE) injection 20 mg (has no administration in time range)  amLODipine (NORVASC) tablet 10 mg (10 mg Oral Given 12/02/19 0905)  carvedilol (COREG) tablet 25 mg (25 mg Oral Given 12/02/19 0905)  hydrALAZINE (APRESOLINE) tablet 50 mg (50 mg Oral Given 12/02/19 1610)    ED Course  I have reviewed the triage vital signs and the nursing notes.  Pertinent labs & imaging results that were available during my care of the patient were reviewed by me and considered in my medical decision making (see chart for details).    MDM Rules/Calculators/A&P                     Consult: Dr. Rory Percy neurology.  Recommends MRI brain.  If no acute, symptoms due to hypertensive urgency.  Consult: [facility] hospitalist for admission.  Dr. Tawanna Solo.  Will assess the patient for admission.  Patient presents aligned above.  She has had waxing and waning hypertension with symptoms of headache and now weakness and numbness on the right side.  Patient is alert with clear mental status.  She is nontoxic.  No signs of infectious symptoms.  Patient's blood pressure significantly elevated on arrival.  Patient was given her regular a.m. medications.  Diagnostic work-up for stroke ruled out.   MRI does not show any acute ischemic findings.  Patient's blood pressure remains elevated in the systolics of 960A after administration of regular meds.  Have administered hydralazine 20 mg IV.  Upon recheck she continues to endorse symptoms of subjective numbness to the right upper and lower extremity.  Anticipate admission for hypertensive urgency with right-sided weakness. Final Clinical Impression(s) / ED Diagnoses Final diagnoses:  Hypertensive urgency  Acute right-sided weakness    Rx / DC Orders ED Discharge Orders    None       Charlesetta Shanks, MD 12/02/19 1326

## 2019-12-02 NOTE — Progress Notes (Signed)
Pt has declined use of CPAP QHS, pt states that she does not use one at home.  RT to monitor and assess as needed.

## 2019-12-02 NOTE — ED Notes (Signed)
[facility] EDP made aware of patient's BP by Gwyndolyn Saxon EMT.

## 2019-12-02 NOTE — ED Triage Notes (Signed)
Pt reports around midnight when she went to wash her face, she felt dizzy, had a headache and right side weakness.

## 2019-12-02 NOTE — H&P (Addendum)
History and Physical    Jacqueline [name] TZG:[id number] DOB: 1981-05-28 DOA: 12/02/2019  PCP: Audley Hose, MD   Patient coming from: Home    Chief Complaint: Weakness on the right side  HPI: Jacqueline [name] is a 39 y.o. female with medical history significant of uncontrolled hypertension, diabetes type 2, morbid obesity, paroxysmal A. fib not on anticoagulation, diabetic neuropathy, CKD stage IIIb who presented from her home today to the emergency department with complaints of 3-days history of weakness, lightheadedness.  She was apparently allright before 3 days when she started having above symptoms.  Since yesterday, she has been feeling weak on the right side along with tingling and numbness.  She was unable to walk and when she attempted to walk, she inclined on her right side.  Patient has history of uncontrolled hypertension but is currently compliant with her medications.  Over the last week, her systolic blood pressure has been ranging in the range of 190s.  Yesterday, she woke up feeling lightheaded and felt numbness on the right side of her face, right arm and right leg.  She also reported blurry vision over the weekend.  Her symptoms have been waxing and waning.  She became worried about her symptoms today and presented to the emergency department. Patient seen and examined at the bedside in the emergency department.  She was hypertensive during my evaluation.  She was still having symptoms of numbness, tingling and was weak on the right side.  She was alert and oriented.  She denies any fever, chills, chest pain, shortness of breath, palpitations, abdominal pain, nausea or vomiting. She was admitted here last time on December 2020 with atypical chest pain.She was also diagnosed with Covid at that time.  ED Course: On presentation, she was hypertensive with systolic blood pressure in the range of 200s.  She was given IV hydralazine with some improvement.  CT head done in the emergency  department did not show any acute intracranial abnormalities.  MRI did not show any acute stroke.  Patient was admitted for the management of hypertensive urgency, further evaluation of weakness on the right side and physical therapy assessment.  Review of Systems: As per HPI otherwise 10 point review of systems negative.    Past Medical History:  Diagnosis Date  . Anemia   . Anxiety   . Chronic bronchitis ([place])   . Hypertension   . Increased frequency of headaches   . Morbid obesity ([place])   . Sleep apnea   . Type II diabetes mellitus ([place])     Past Surgical History:  Procedure Laterality Date  . BARIATRIC SURGERY    . BREAST REDUCTION SURGERY  03/21/2017  . CARDIAC CATHETERIZATION  01/06/2016  . CARDIAC CATHETERIZATION N/A 01/06/2016   Procedure: Left Heart Cath and Coronary Angiography;  Surgeon: Wellington Hampshire, MD;  Location: [facility];  Service: Cardiovascular;  Laterality: N/A;  . CESAREAN SECTION  08/2014     reports that she has never smoked. She has never used smokeless tobacco. She reports that she does not drink alcohol or use drugs.  No Known Allergies  Family History  Problem Relation Age of Onset  . Diabetes Mother   . Hypertension Mother   . Thyroid disease Mother   . Kidney disease Maternal Grandmother   . Diabetes Maternal Grandmother   . Heart attack Other      Prior to Admission medications   Medication Sig Start Date End Date Taking? Authorizing Provider  amLODipine (NORVASC)  10 MG tablet Take 1 tablet (10 mg total) by mouth daily. 07/08/19  Yes Nita Sells, MD  atorvastatin (LIPITOR) 10 MG tablet Take 10 mg by mouth daily.  03/08/18  Yes [provider]  carvedilol (COREG) 25 MG tablet Take 1 tablet (25 mg total) by mouth 2 (two) times daily with a meal. 05/06/18  Yes Lady Deutscher, MD  diclofenac Sodium (VOLTAREN) 1 % GEL Apply 2 g topically 4 (four) times daily as needed (pain).    Yes [provider]    furosemide (LASIX) 40 MG tablet Take 40 mg by mouth daily. 06/23/19  Yes [provider]  gabapentin (NEURONTIN) 100 MG capsule Take 100 mg by mouth 3 (three) times daily.    Yes [provider]  hydrALAZINE (APRESOLINE) 50 MG tablet Take 1 tablet (50 mg total) by mouth 3 (three) times daily. 07/07/19  Yes Nita Sells, MD  sertraline (ZOLOFT) 100 MG tablet Take 100 mg by mouth at bedtime. 04/30/19  Yes [provider]    Physical Exam: Vitals:   12/02/19 1115 12/02/19 1121 12/02/19 1300 12/02/19 1307  BP: (!) 180/98 (!) 180/98 (!) 171/89 (!) 172/82  Pulse: 84  91 88  Resp: 20   17  Temp:  98.5 F (36.9 C)  98.9 F (37.2 C)  TempSrc:    Oral  SpO2: 100%  100% 100%    Constitutional: Not in distress, morbidly obese Vitals:   12/02/19 1115 12/02/19 1121 12/02/19 1300 12/02/19 1307  BP: (!) 180/98 (!) 180/98 (!) 171/89 (!) 172/82  Pulse: 84  91 88  Resp: 20   17  Temp:  98.5 F (36.9 C)  98.9 F (37.2 C)  TempSrc:    Oral  SpO2: 100%  100% 100%   Eyes: PERRL, lids and conjunctivae normal ENMT: Mucous membranes are moist.  Neck: normal, supple, no masses, no thyromegaly Respiratory: clear to auscultation bilaterally, no wheezing, no crackles. Normal respiratory effort. No accessory muscle use.  Cardiovascular: Regular rate and rhythm, no murmurs / rubs / gallops. No extremity edema.  Abdomen: no tenderness, no masses palpated. No hepatosplenomegaly. Bowel sounds positive.  Musculoskeletal: no clubbing / cyanosis. No joint deformity upper and lower extremities.  Skin: no rashes, lesions, ulcers. No induration Neurologic: CN 2-12 grossly intact.   Motor: 3+/5 on RUE,RLE            5/5 on LUE/LLE  Foley Catheter:None  Labs on Admission: I have personally reviewed following labs and imaging studies  CBC: Recent Labs  Lab 12/02/19 0838  WBC 8.4  NEUTROABS 4.5  HGB 9.7*  HCT 29.4*  MCV 90.2  PLT 676   Basic Metabolic Panel: Recent  Labs  Lab 12/02/19 0838  NA 143  K 4.2  CL 115*  CO2 21*  GLUCOSE 171*  BUN 35*  CREATININE 2.14*  CALCIUM 8.3*   GFR: CrCl cannot be calculated (Unknown ideal weight.). Liver Function Tests: Recent Labs  Lab 12/02/19 0838  AST 32  ALT 27  ALKPHOS 97  BILITOT 0.7  PROT 6.4*  ALBUMIN 2.9*   No results for input(s): LIPASE, AMYLASE in the last 168 hours. No results for input(s): AMMONIA in the last 168 hours. Coagulation Profile: Recent Labs  Lab 12/02/19 0838  INR 1.1   Cardiac Enzymes: No results for input(s): CKTOTAL, CKMB, CKMBINDEX, TROPONINI in the last 168 hours. BNP (last 3 results) No results for input(s): PROBNP in the last 8760 hours. HbA1C: No results for input(s): HGBA1C in  the last 72 hours. CBG: No results for input(s): GLUCAP in the last 168 hours. Lipid Profile: No results for input(s): CHOL, HDL, LDLCALC, TRIG, CHOLHDL, LDLDIRECT in the last 72 hours. Thyroid Function Tests: No results for input(s): TSH, T4TOTAL, FREET4, T3FREE, THYROIDAB in the last 72 hours. Anemia Panel: No results for input(s): VITAMINB12, FOLATE, FERRITIN, TIBC, IRON, RETICCTPCT in the last 72 hours. Urine analysis:    Component Value Date/Time   COLORURINE YELLOW 12/02/2019 0919   APPEARANCEUR CLEAR 12/02/2019 0919   LABSPEC 1.014 12/02/2019 0919   PHURINE 6.0 12/02/2019 0919   GLUCOSEU 150 (A) 12/02/2019 0919   HGBUR NEGATIVE 12/02/2019 0919   BILIRUBINUR NEGATIVE 12/02/2019 0919   KETONESUR NEGATIVE 12/02/2019 0919   PROTEINUR >=300 (A) 12/02/2019 0919   UROBILINOGEN 0.2 08/21/2012 2130   NITRITE NEGATIVE 12/02/2019 0919   LEUKOCYTESUR NEGATIVE 12/02/2019 0919    Radiological Exams on Admission: MR BRAIN WO CONTRAST  Result Date: 12/02/2019 CLINICAL DATA:  Neuro deficit, subacute. Additional history provided: Patient reports dizziness, headache, right-sided weakness. EXAM: MRI HEAD WITHOUT CONTRAST TECHNIQUE: Multiplanar, multiecho pulse sequences of the brain  and surrounding structures were obtained without intravenous contrast. COMPARISON:  Non-contrast head CT performed earlier the same day 12/02/2019, brain MRI 06/24/2019 FINDINGS: Brain: Stable ill-defined T2/FLAIR hyperintensity within the pons with additional tiny scattered foci of T2/FLAIR hyperintensity within the cerebral white matter. Findings are nonspecific but likely reflect chronic small vessel ischemic changes given the history of hypertension and type 2 diabetes. There is no acute infarct. No evidence of intracranial mass. No chronic intracranial blood products. No extra-axial fluid collection. No midline shift. Vascular: Expected proximal arterial flow voids. Skull and upper cervical spine: No focal marrow lesion. Sinuses/Orbits: Visualized orbits show no acute finding. Minimal ethmoid sinus mucosal thickening. Trace fluid within the right mastoid air cells. IMPRESSION: 1. No evidence of acute intracranial abnormality, including acute infarction. 2. Stable scattered T2 hyperintensity within the pons and cerebral white matter which is nonspecific, but likely reflects chronic small vessel ischemic changes given the history of hypertension and type 2 diabetes mellitus. 3. Minimal ethmoid sinus mucosal thickening. 4. Trace fluid within right mastoid air cells. Electronically Signed   By: Kellie Simmering DO   On: 12/02/2019 12:13   CT HEAD CODE STROKE WO CONTRAST  Result Date: 12/02/2019 CLINICAL DATA:  Code stroke. Right-sided weakness and numbness, confusion, blurred vision, and intermittent headache. EXAM: CT HEAD WITHOUT CONTRAST TECHNIQUE: Contiguous axial images were obtained from the base of the skull through the vertex without intravenous contrast. COMPARISON:  Head CT and MRI 06/24/2019 FINDINGS: Brain: There is no evidence of acute infarct, intracranial hemorrhage, mass, midline shift, or extra-axial fluid collection. The ventricles and sulci are normal. Vascular: No hyperdense vessel. Skull: No  fracture or suspicious osseous lesion. Sinuses/Orbits: Visualized paranasal sinuses and mastoid air cells are clear. Unremarkable orbits. Other: None. ASPECTS [facility] Stroke Program Early CT Score) - Ganglionic level infarction (caudate, lentiform nuclei, internal capsule, insula, M1-M3 cortex): 7 - Supraganglionic infarction (M4-M6 cortex): 3 Total score (0-10 with 10 being normal): 10 IMPRESSION: Negative head CT.  ASPECTS is 10. These results were called by telephone at the time of interpretation on 12/02/2019 at 9:07 am to Dr. Charlesetta Shanks, who verbally acknowledged these results. Electronically Signed   By: Logan Bores M.D.   On: 12/02/2019 09:08     Assessment/Plan Principal Problem:   Hypertensive urgency Active Problems:   Morbid obesity ([place])   Type II diabetes mellitus with renal manifestations (  HCC)   CKD (chronic kidney disease) stage 3, GFR 30-59 ml/min   Diabetic peripheral neuropathy (HCC)   Right hemiplegia (HCC)    Hypertensive urgency: Hypertensive on presentation.  History of uncontrolled hypertension.  She is compliant with her medications.  Home medications resumed.  Continue as needed labetalol for further blood pressure control.  Monitor blood pressure  Right-sided weakness/tingling/numbness: Started having right-sided weakness about 2 to 3 days ago.  Had difficulty with ambulation.  CT head/MRI brain which did not show any acute stroke. On examination, she has weakness on the right side.  Her tingling and numbness could be associated with diabetic neuropathy.She is on gabapentin which we will continue. Will request for physical therapy evaluation.  Discussed with  Neurology Dr Rory Percy  regarding right-sided weakness though she does not have evidence of a stroke as per imagings.  As per neurology, this is not a new problem with her.  She has presented in the past with similar clinical picture and her weakness is attributed to her complex/hemiplegic migraine.  Neurology  does not recommend any further testing.  Recommended outpatient follow-up with neurology after discharge. MRI showed chronic small vessel ischemic changes .  History of migraine: Complains of headache.  Will give her a cocktail for migraine with Toradol, Compazine and Benadryl.  Continue Fioricet as needed.  She has history of complex/hemiplegic migraine.She needs to follow-up with [facility] neurology as an outpatient.  CKD stage IIIb: Her baseline creatinine is around 2.  Currently kidney function at baseline.  Follows with [facility].  Diabetes type 2: Currently not on insulin or any other meds.  She was on insulin in the past but he stopped taking it after bariatric surgery in February 2020.  Follow-up hemoglobin A1c.  Chronic diastolic congestive heart failure: Currently euvolemic.  On Lasix 40 mg daily at home which we will continue.  History of paroxysmal A. fib: Currently in normal sinus rhythm.  Not on anticoagulation.  History of sleep apnea: Continue CPAP at bedtime.  Hyperlipidemia: Continue Lipitor.  Morbid obesity: Status post gastric bypass in 08/2018.  Normocytic anemia: Most likely associated with chronic kidney disease.  Currently hemoglobin stable.    Severity of Illness: The appropriate patient status for this patient is OBSERVATION.      DVT prophylaxis: heparin [place] Code Status: Full Family Communication: None present at the bed side Consults called: Discussed with neurology on phone.     Shelly Coss MD [facility]  12/02/2019, 1:50 PM

## 2019-12-02 NOTE — Progress Notes (Signed)
Patient c/o headache 8/10.  Patient requesting something to help with her headache. Dr. Tawanna Solo notified via text page.

## 2019-12-03 DIAGNOSIS — E1142 Type 2 diabetes mellitus with diabetic polyneuropathy: Secondary | ICD-10-CM | POA: Diagnosis not present

## 2019-12-03 DIAGNOSIS — Z794 Long term (current) use of insulin: Secondary | ICD-10-CM

## 2019-12-03 DIAGNOSIS — R531 Weakness: Secondary | ICD-10-CM | POA: Diagnosis not present

## 2019-12-03 DIAGNOSIS — N1832 Chronic kidney disease, stage 3b: Secondary | ICD-10-CM | POA: Diagnosis not present

## 2019-12-03 DIAGNOSIS — G8191 Hemiplegia, unspecified affecting right dominant side: Secondary | ICD-10-CM

## 2019-12-03 DIAGNOSIS — E1121 Type 2 diabetes mellitus with diabetic nephropathy: Secondary | ICD-10-CM

## 2019-12-03 DIAGNOSIS — I160 Hypertensive urgency: Secondary | ICD-10-CM | POA: Diagnosis not present

## 2019-12-03 LAB — CBC
HCT: 26.7 % — ABNORMAL LOW (ref 36.0–46.0)
Hemoglobin: 8.7 g/dL — ABNORMAL LOW (ref 12.0–15.0)
MCH: 29.8 pg (ref 26.0–34.0)
MCHC: 32.6 g/dL (ref 30.0–36.0)
MCV: 91.4 fL (ref 80.0–100.0)
Platelets: 276 10*3/uL (ref 150–400)
RBC: 2.92 MIL/uL — ABNORMAL LOW (ref 3.87–5.11)
RDW: 13.3 % (ref 11.5–15.5)
WBC: 7.4 10*3/uL (ref 4.0–10.5)
nRBC: 0 % (ref 0.0–0.2)

## 2019-12-03 LAB — HEMOGLOBIN A1C
Hgb A1c MFr Bld: 6.5 % — ABNORMAL HIGH (ref 4.8–5.6)
Mean Plasma Glucose: 140 mg/dL

## 2019-12-03 LAB — BASIC METABOLIC PANEL
Anion gap: 7 (ref 5–15)
BUN: 34 mg/dL — ABNORMAL HIGH (ref 6–20)
CO2: 19 mmol/L — ABNORMAL LOW (ref 22–32)
Calcium: 8.3 mg/dL — ABNORMAL LOW (ref 8.9–10.3)
Chloride: 117 mmol/L — ABNORMAL HIGH (ref 98–111)
Creatinine, Ser: 2.25 mg/dL — ABNORMAL HIGH (ref 0.44–1.00)
GFR calc Af Amer: 31 mL/min — ABNORMAL LOW (ref >60)
GFR calc non Af Amer: 27 mL/min — ABNORMAL LOW (ref >60)
Glucose, Bld: 129 mg/dL — ABNORMAL HIGH (ref 70–99)
Potassium: 4.1 mmol/L (ref 3.5–5.1)
Sodium: 143 mmol/L (ref 135–145)

## 2019-12-03 MED ORDER — PROCHLORPERAZINE MALEATE 10 MG PO TABS: 10.0000 mg | ORAL_TABLET | Freq: Four times a day (QID) | ORAL | 0 refills | Status: DC | PRN

## 2019-12-03 MED ORDER — DIPHENHYDRAMINE HCL 50 MG/ML IJ SOLN
12.5000 mg | Freq: Once | INTRAMUSCULAR | Status: AC
  Administered 2019-12-03: 12.5 mg via INTRAVENOUS
  Filled 2019-12-03: qty 1

## 2019-12-03 MED ORDER — KETOROLAC TROMETHAMINE 15 MG/ML IJ SOLN
15.0000 mg | Freq: Once | INTRAMUSCULAR | Status: AC
  Administered 2019-12-03: 15 mg via INTRAVENOUS
  Filled 2019-12-03: qty 1

## 2019-12-03 MED ORDER — PROCHLORPERAZINE MALEATE 10 MG PO TABS
10.0000 mg | ORAL_TABLET | Freq: Four times a day (QID) | ORAL | Status: DC | PRN
  Administered 2019-12-03: 10 mg via ORAL
  Filled 2019-12-03: qty 1

## 2019-12-03 MED ORDER — BUTALBITAL-APAP-CAFFEINE 50-325-40 MG PO TABS: 1.0000 | ORAL_TABLET | ORAL | 0 refills | Status: DC | PRN

## 2019-12-03 MED ORDER — DIPHENHYDRAMINE HCL 25 MG PO CAPS
25.0000 mg | ORAL_CAPSULE | Freq: Four times a day (QID) | ORAL | Status: DC | PRN
  Administered 2019-12-03: 25 mg via ORAL
  Filled 2019-12-03: qty 1

## 2019-12-03 MED ORDER — DIPHENHYDRAMINE HCL 25 MG PO CAPS: 25.0000 mg | ORAL_CAPSULE | Freq: Four times a day (QID) | ORAL | 0 refills | Status: DC | PRN

## 2019-12-03 MED ORDER — DIVALPROEX SODIUM 250 MG PO DR TAB
500.0000 mg | DELAYED_RELEASE_TABLET | Freq: Once | ORAL | Status: AC
  Administered 2019-12-03: 500 mg via ORAL
  Filled 2019-12-03: qty 2

## 2019-12-03 MED ORDER — PROCHLORPERAZINE EDISYLATE 10 MG/2ML IJ SOLN
10.0000 mg | Freq: Once | INTRAMUSCULAR | Status: AC
  Administered 2019-12-03: 10 mg via INTRAVENOUS
  Filled 2019-12-03: qty 2

## 2019-12-03 NOTE — Discharge Instructions (Signed)
Follow with Audley Hose, MD in 5-7 days  Please get a complete blood count and chemistry panel checked by your Primary MD at your next visit, and again as instructed by your Primary MD. Please get your medications reviewed and adjusted by your Primary MD.  Please request your Primary MD to go over all Hospital Tests and Procedure/Radiological results at the follow up, please get all Hospital records sent to your Prim MD by signing hospital release before you go home.  In some cases, there will be blood work, cultures and biopsy results pending at the time of your discharge. Please request that your primary care M.D. goes through all the records of your hospital data and follows up on these results.  If you had Pneumonia of Lung problems at the Hospital: Please get a 2 view Chest X ray done in 6-8 weeks after hospital discharge or sooner if instructed by your Primary MD.  If you have Congestive Heart Failure: Please call your Cardiologist or Primary MD anytime you have any of the following symptoms:  1) 3 pound weight gain in 24 hours or 5 pounds in 1 week  2) shortness of breath, with or without a dry hacking cough  3) swelling in the hands, feet or stomach  4) if you have to sleep on extra pillows at night in order to breathe  Follow cardiac low salt diet and 1.5 lit/day fluid restriction.  If you have diabetes Accuchecks 4 times/day, Once in AM empty stomach and then before each meal. Log in all results and show them to your primary doctor at your next visit. If any glucose reading is under 80 or above 300 call your primary MD immediately.  If you have Seizure/Convulsions/Epilepsy: Please do not drive, operate heavy machinery, participate in activities at heights or participate in high speed sports until you have seen by Primary MD or a Neurologist and advised to do so again. Per [facility] statutes, patients with seizures are not allowed to drive until they have been  seizure-free for six months.  Use caution when using heavy equipment or power tools. Avoid working on ladders or at heights. Take showers instead of baths. Ensure the water temperature is not too high on the home water heater. Do not go swimming alone. Do not lock yourself in a room alone (i.e. bathroom). When caring for infants or small children, sit down when holding, feeding, or changing them to minimize risk of injury to the child in the event you have a seizure. Maintain good sleep hygiene. Avoid alcohol.   If you had Gastrointestinal Bleeding: Please ask your Primary MD to check a complete blood count within one week of discharge or at your next visit. Your endoscopic/colonoscopic biopsies that are pending at the time of discharge, will also need to followed by your Primary MD.  Get Medicines reviewed and adjusted. Please take all your medications with you for your next visit with your Primary MD  Please request your Primary MD to go over all hospital tests and procedure/radiological results at the follow up, please ask your Primary MD to get all Hospital records sent to his/her office.  If you experience worsening of your admission symptoms, develop shortness of breath, life threatening emergency, suicidal or homicidal thoughts you must seek medical attention immediately by calling 911 or calling your MD immediately  if symptoms less severe.  You must read complete instructions/literature along with all the possible adverse reactions/side effects for all the Medicines you  take and that have been prescribed to you. Take any new Medicines after you have completely understood and accpet all the possible adverse reactions/side effects.   Do not drive or operate heavy machinery when taking Pain medications.   Do not take more than prescribed Pain, Sleep and Anxiety Medications  Special Instructions: If you have smoked or chewed Tobacco  in the last 2 yrs please stop smoking, stop any regular  Alcohol  and or any Recreational drug use.  Wear Seat belts while driving.  Please note You were cared for by a hospitalist during your hospital stay. If you have any questions about your discharge medications or the care you received while you were in the hospital after you are discharged, you can call the unit and asked to speak with the hospitalist on call if the hospitalist that took care of you is not available. Once you are discharged, your primary care physician will handle any further medical issues. Please note that NO REFILLS for any discharge medications will be authorized once you are discharged, as it is imperative that you return to your primary care physician (or establish a relationship with a primary care physician if you do not have one) for your aftercare needs so that they can reassess your need for medications and monitor your lab values.  You can reach the hospitalist office at phone [phone] or fax [phone]   If you do not have a primary care physician, you can call [phone] for a physician referral.  Activity: As tolerated with Full fall precautions use walker/cane & assistance as needed    Diet: low sodium diet  Disposition Home

## 2019-12-03 NOTE — Discharge Summary (Signed)
Physician Discharge Summary  Jacqueline [name] ASN:[id number] DOB: Sep 26, 1980 DOA: 12/02/2019  PCP: Audley Hose, MD  Admit date: 12/02/2019 Discharge date: 12/03/2019  Admitted From: home Disposition:  home  Recommendations for Outpatient Follow-up:  1. Follow up with PCP in 1-2 weeks 2. Please obtain BMP/CBC in one week  Home Health: none Equipment/Devices: none  Discharge Condition: stable CODE STATUS: Full code Diet recommendation: low sodium  HPI: Per admitting MD,  Jacqueline [name] is a 39 y.o. female with medical history significant of uncontrolled hypertension, diabetes type 2, morbid obesity, paroxysmal A. fib not on anticoagulation, diabetic neuropathy, CKD stage IIIb who presented from her home today to the emergency department with complaints of 3-days history of weakness, lightheadedness.  She was apparently allright before 3 days when she started having above symptoms.  Since yesterday, she has been feeling weak on the right side along with tingling and numbness.  She was unable to walk and when she attempted to walk, she inclined on her right side.  Patient has history of uncontrolled hypertension but is currently compliant with her medications.  Over the last week, her systolic blood pressure has been ranging in the range of 190s.  Yesterday, she woke up feeling lightheaded and felt numbness on the right side of her face, right arm and right leg.  She also reported blurry vision over the weekend.  Her symptoms have been waxing and waning.  She became worried about her symptoms today and presented to the emergency department. Patient seen and examined at the bedside in the emergency department.  She was hypertensive during my evaluation.  She was still having symptoms of numbness, tingling and was weak on the right side.  She was alert and oriented.  She denies any fever, chills, chest pain, shortness of breath, palpitations, abdominal pain, nausea or vomiting. She was admitted here  last time on December 2020 with atypical chest pain.She was also diagnosed with Covid at that time.  Hospital Course / Discharge diagnoses: Principal problem: Hypertensive urgency-patient was hypertensive urgency systolic.  Reports medication compliance at home.  She was started on medications with significant improvement in her blood pressure which has now normalized.  Active problems: Right-sided weakness/tingling/numbness -Started having right-sided weakness about 2 to 3 days ago. Had difficulty with ambulation. CT head/MRI brain which did not show any acute stroke.  She had persistent weakness on the right side, case was discussed with neurology on call Dr Rory Percy, suggesting that this is possible complex hemiplegic migraine.  Given persistent weakness he recommends Depakote 500 mg once.  She appears to be improved after that, he was able to work with physical therapy and ambulate in the hallway, still some weakness present but able to function independently and stable for home discharge. Migraine headache-responded really well to Compazine, Benadryl and Toradol.  Will prescribe Compazine and Benadryl at discharge, would like to avoid Toradol given chronic kidney disease CKD stage IIIb -Her baseline creatinine is around 2.  Currently kidney function at baseline.  Follows with [facility]. Diabetes type 2 -Currently not on insulin or any other meds.  She was on insulin in the past but he stopped taking it after bariatric surgery in February 2020.  Follow-up hemoglobin A1c was 6.5. Chronic diastolic congestive heart failure-Currently euvolemic. History of paroxysmal A. fib -Currently in normal sinus rhythm.  Not on anticoagulation. History of sleep apnea -Continue CPAP at bedtime. Hyperlipidemia -Continue Lipitor. Morbid obesity-Status post gastric bypass in 08/2018. Normocytic anemia -Most likely associated with  chronic kidney disease.  Currently hemoglobin stable.  Discharge  Instructions   Allergies as of 12/03/2019   No Known Allergies     Medication List    TAKE these medications   amLODipine 10 MG tablet Commonly known as: NORVASC Take 1 tablet (10 mg total) by mouth daily.   atorvastatin 10 MG tablet Commonly known as: LIPITOR Take 10 mg by mouth daily.   butalbital-acetaminophen-caffeine 50-325-40 MG tablet Commonly known as: FIORICET Take 1 tablet by mouth every 4 (four) hours as needed for headache.   carvedilol 25 MG tablet Commonly known as: COREG Take 1 tablet (25 mg total) by mouth 2 (two) times daily with a meal.   diclofenac Sodium 1 % Gel Commonly known as: VOLTAREN Apply 2 g topically 4 (four) times daily as needed (pain).   diphenhydrAMINE 25 mg capsule Commonly known as: BENADRYL Take 1 capsule (25 mg total) by mouth every 6 (six) hours as needed (migraine headache).   furosemide 40 MG tablet Commonly known as: LASIX Take 40 mg by mouth daily.   gabapentin 100 MG capsule Commonly known as: NEURONTIN Take 100 mg by mouth 3 (three) times daily.   hydrALAZINE 50 MG tablet Commonly known as: APRESOLINE Take 1 tablet (50 mg total) by mouth 3 (three) times daily.   prochlorperazine 10 MG tablet Commonly known as: COMPAZINE Take 1 tablet (10 mg total) by mouth every 6 (six) hours as needed (migraine headace).   sertraline 100 MG tablet Commonly known as: ZOLOFT Take 100 mg by mouth at bedtime.      Follow-up Information    Bakare, Mobolaji B, MD. Schedule an appointment as soon as possible for a visit in 2 week(s).   Specialty: Internal Medicine Contact information: [place] [address] [phone]        Minus Breeding, MD .   Specialty: Cardiology Contact information: [address] [place] [address] [phone]           Consultations:  None   Procedures/Studies:  MR BRAIN WO CONTRAST  Result Date: 12/02/2019 CLINICAL DATA:  Neuro deficit,  subacute. Additional history provided: Patient reports dizziness, headache, right-sided weakness. EXAM: MRI HEAD WITHOUT CONTRAST TECHNIQUE: Multiplanar, multiecho pulse sequences of the brain and surrounding structures were obtained without intravenous contrast. COMPARISON:  Non-contrast head CT performed earlier the same day 12/02/2019, brain MRI 06/24/2019 FINDINGS: Brain: Stable ill-defined T2/FLAIR hyperintensity within the pons with additional tiny scattered foci of T2/FLAIR hyperintensity within the cerebral white matter. Findings are nonspecific but likely reflect chronic small vessel ischemic changes given the history of hypertension and type 2 diabetes. There is no acute infarct. No evidence of intracranial mass. No chronic intracranial blood products. No extra-axial fluid collection. No midline shift. Vascular: Expected proximal arterial flow voids. Skull and upper cervical spine: No focal marrow lesion. Sinuses/Orbits: Visualized orbits show no acute finding. Minimal ethmoid sinus mucosal thickening. Trace fluid within the right mastoid air cells. IMPRESSION: 1. No evidence of acute intracranial abnormality, including acute infarction. 2. Stable scattered T2 hyperintensity within the pons and cerebral white matter which is nonspecific, but likely reflects chronic small vessel ischemic changes given the history of hypertension and type 2 diabetes mellitus. 3. Minimal ethmoid sinus mucosal thickening. 4. Trace fluid within right mastoid air cells. Electronically Signed   By: Kellie Simmering DO   On: 12/02/2019 12:13   CT HEAD CODE STROKE WO CONTRAST  Result Date: 12/02/2019 CLINICAL DATA:  Code stroke. Right-sided  weakness and numbness, confusion, blurred vision, and intermittent headache. EXAM: CT HEAD WITHOUT CONTRAST TECHNIQUE: Contiguous axial images were obtained from the base of the skull through the vertex without intravenous contrast. COMPARISON:  Head CT and MRI 06/24/2019 FINDINGS: Brain: There  is no evidence of acute infarct, intracranial hemorrhage, mass, midline shift, or extra-axial fluid collection. The ventricles and sulci are normal. Vascular: No hyperdense vessel. Skull: No fracture or suspicious osseous lesion. Sinuses/Orbits: Visualized paranasal sinuses and mastoid air cells are clear. Unremarkable orbits. Other: None. ASPECTS [facility] Stroke Program Early CT Score) - Ganglionic level infarction (caudate, lentiform nuclei, internal capsule, insula, M1-M3 cortex): 7 - Supraganglionic infarction (M4-M6 cortex): 3 Total score (0-10 with 10 being normal): 10 IMPRESSION: Negative head CT.  ASPECTS is 10. These results were called by telephone at the time of interpretation on 12/02/2019 at 9:07 am to Dr. Charlesetta Shanks, who verbally acknowledged these results. Electronically Signed   By: Logan Bores M.D.   On: 12/02/2019 09:08      Subjective: - no chest pain, shortness of breath, no abdominal pain, nausea or vomiting.   Discharge Exam: BP (!) 141/81 (BP Location: Right Arm)   Pulse 90   Temp 99.1 F (37.3 C) (Oral)   Resp 18   Ht 5\' 5"  (1.651 m)   Wt 100.7 kg   LMP 11/05/2019   SpO2 94%   BMI 36.94 kg/m   General: Pt is alert, awake, not in acute distress Cardiovascular: RRR, S1/S2 +, no rubs, no gallops Respiratory: CTA bilaterally, no wheezing, no rhonchi Abdominal: Soft, NT, ND, bowel sounds + Extremities: no edema, no cyanosis    The results of significant diagnostics from this hospitalization (including imaging, microbiology, ancillary and laboratory) are listed below for reference.     Microbiology: No results found for this or any previous visit (from the past 240 hour(s)).   Labs: Basic Metabolic Panel: Recent Labs  Lab 12/02/19 0838 12/03/19 0345  NA 143 143  K 4.2 4.1  CL 115* 117*  CO2 21* 19*  GLUCOSE 171* 129*  BUN 35* 34*  CREATININE 2.14* 2.25*  CALCIUM 8.3* 8.3*   Liver Function Tests: Recent Labs  Lab 12/02/19 0838  AST 32  ALT  27  ALKPHOS 97  BILITOT 0.7  PROT 6.4*  ALBUMIN 2.9*   CBC: Recent Labs  Lab 12/02/19 0838 12/03/19 0345  WBC 8.4 7.4  NEUTROABS 4.5  --   HGB 9.7* 8.7*  HCT 29.4* 26.7*  MCV 90.2 91.4  PLT 306 276   CBG: No results for input(s): GLUCAP in the last 168 hours. Hgb A1c Recent Labs    12/02/19 0838  HGBA1C 6.5*   Lipid Profile No results for input(s): CHOL, HDL, LDLCALC, TRIG, CHOLHDL, LDLDIRECT in the last 72 hours. Thyroid function studies No results for input(s): TSH, T4TOTAL, T3FREE, THYROIDAB in the last 72 hours.  Invalid input(s): FREET3 Urinalysis    Component Value Date/Time   COLORURINE YELLOW 12/02/2019 0919   APPEARANCEUR CLEAR 12/02/2019 0919   LABSPEC 1.014 12/02/2019 0919   PHURINE 6.0 12/02/2019 0919   GLUCOSEU 150 (A) 12/02/2019 0919   HGBUR NEGATIVE 12/02/2019 0919   BILIRUBINUR NEGATIVE 12/02/2019 0919   KETONESUR NEGATIVE 12/02/2019 0919   PROTEINUR >=300 (A) 12/02/2019 0919   UROBILINOGEN 0.2 08/21/2012 2130   NITRITE NEGATIVE 12/02/2019 0919   LEUKOCYTESUR NEGATIVE 12/02/2019 0919    FURTHER DISCHARGE INSTRUCTIONS:   Get Medicines reviewed and adjusted: Please take all your medications with you for your  next visit with your Primary MD   Laboratory/radiological data: Please request your Primary MD to go over all hospital tests and procedure/radiological results at the follow up, please ask your Primary MD to get all Hospital records sent to his/her office.   In some cases, they will be blood work, cultures and biopsy results pending at the time of your discharge. Please request that your primary care M.D. goes through all the records of your hospital data and follows up on these results.   Also Note the following: If you experience worsening of your admission symptoms, develop shortness of breath, life threatening emergency, suicidal or homicidal thoughts you must seek medical attention immediately by calling 911 or calling your MD  immediately  if symptoms less severe.   You must read complete instructions/literature along with all the possible adverse reactions/side effects for all the Medicines you take and that have been prescribed to you. Take any new Medicines after you have completely understood and accpet all the possible adverse reactions/side effects.    Do not drive when taking Pain medications or sleeping medications (Benzodaizepines)   Do not take more than prescribed Pain, Sleep and Anxiety Medications. It is not advisable to combine anxiety,sleep and pain medications without talking with your primary care practitioner   Special Instructions: If you have smoked or chewed Tobacco  in the last 2 yrs please stop smoking, stop any regular Alcohol  and or any Recreational drug use.   Wear Seat belts while driving.   Please note: You were cared for by a hospitalist during your hospital stay. Once you are discharged, your primary care physician will handle any further medical issues. Please note that NO REFILLS for any discharge medications will be authorized once you are discharged, as it is imperative that you return to your primary care physician (or establish a relationship with a primary care physician if you do not have one) for your post hospital discharge needs so that they can reassess your need for medications and monitor your lab values.  Time coordinating discharge: 40 minutes  SIGNED:  Marzetta Board, MD, PhD 12/03/2019, 5:02 PM

## 2019-12-03 NOTE — Progress Notes (Signed)
Pt does have R sided weakness in U and L extremities. Able to ambulate to BR as a stand-by assist.When Bi  arms assessed, R arm has a ataxia where L arm is smooth. When heel down shin was assessed same ataxia w/ RLE.

## 2019-12-03 NOTE — Evaluation (Signed)
Physical Therapy Evaluation Patient Details Name: Jacqueline [name] MRN: [id number] DOB: Sep 01, 1980 Today's Date: 12/03/2019   History of Present Illness  39 y.o. female with medical history significant of uncontrolled hypertension, diabetes type 2, morbid obesity, paroxysmal A. fib not on anticoagulation, diabetic neuropathy, CKD stage IIIb presented  to the emergency department with complaints of 3-days history of right sided weakness, lightheadedness. CT/MRI negative  Clinical Impression  Pt admitted with above diagnosis.  Pt  With high level balance deficits, no overt LOB but with significant unsteadiness with balance challenges. Would benefit from OPPT to address higher level balance deficits. Will follow in acute setting  Pt currently with functional limitations due to the deficits listed below (see PT Problem List). Pt will benefit from skilled PT to increase their independence and safety with mobility to allow discharge to the venue listed below.       Follow Up Recommendations Outpatient PT    Equipment Recommendations  None recommended by PT    Recommendations for Other Services       Precautions / Restrictions Precautions Precautions: Fall      Mobility  Bed Mobility Overal bed mobility: Needs Assistance Bed Mobility: Supine to Sit     Supine to sit: Supervision     General bed mobility comments: for safety  Transfers Overall transfer level: Needs assistance Equipment used: Rolling walker (2 wheeled) Transfers: Sit to/from Stand Sit to Stand: Min guard         General transfer comment: for safety  Ambulation/Gait Ambulation/Gait assistance: Min guard;Supervision Gait Distance (Feet): 240 Feet Assistive device: None Gait Pattern/deviations: Step-through pattern;Decreased stride length     General Gait Details: min/guard for safety  Stairs            Wheelchair Mobility    Modified Rankin (Stroke Patients Only)       Balance Overall balance  assessment: Needs assistance Sitting-balance support: Feet supported Sitting balance-Leahy Scale: Fair     Standing balance support: No upper extremity supported                 High level balance activites: Backward walking;Head turns;Direction changes;Turns High Level Balance Comments: no overt LOB, unsteady, steppage response with head turns             Pertinent Vitals/Pain Pain Assessment: 0-10 Pain Score: 10-Worst pain ever Pain Location: head Pain Descriptors / Indicators: Aching Pain Intervention(s): Limited activity within patient's tolerance;Monitored during session    Home Living Family/patient expects to be discharged to:: Private residence Living Arrangements: Spouse/significant other Available Help at Discharge: Family Type of Home: House Home Access: Level entry     Home Layout: Two level;Bed/bath upstairs Home Equipment: None      Prior Function                 Hand Dominance        Extremity/Trunk Assessment   Upper Extremity Assessment Upper Extremity Assessment: RUE deficits/detail RUE Deficits / Details: grossly 4/5; AROM WFL RUE Coordination: decreased fine motor    Lower Extremity Assessment Lower Extremity Assessment: RLE deficits/detail RLE Deficits / Details: AROM grossly WFL,  ankle 3+/5 df/pf,  knee extension 4-/5, knee flexion 4/5 RLE Coordination: decreased fine motor       Communication   Communication: No difficulties  Cognition Arousal/Alertness: Awake/alert Behavior During Therapy: WFL for tasks assessed/performed Overall Cognitive Status: Within Functional Limits for tasks assessed  General Comments      Exercises     Assessment/Plan    PT Assessment Patient needs continued PT services  PT Problem List Decreased mobility;Pain;Decreased balance;Decreased strength;Decreased activity tolerance       PT Treatment Interventions DME  instruction;Therapeutic exercise;Functional mobility training;Therapeutic activities;Patient/family education;Gait training    PT Goals (Current goals can be found in the Care Plan section)  Acute Rehab PT Goals Patient Stated Goal: less pain PT Goal Formulation: With patient Time For Goal Achievement: 12/10/19 Potential to Achieve Goals: Good    Frequency Min 3X/week   Barriers to discharge        Co-evaluation               AM-PAC PT "6 Clicks" Mobility  Outcome Measure Help needed turning from your back to your side while in a flat bed without using bedrails?: None Help needed moving from lying on your back to sitting on the side of a flat bed without using bedrails?: None Help needed moving to and from a bed to a chair (including a wheelchair)?: A Little Help needed standing up from a chair using your arms (e.g., wheelchair or bedside chair)?: A Little Help needed to walk in hospital room?: A Little Help needed climbing 3-5 steps with a railing? : A Little 6 Click Score: 20    End of Session Equipment Utilized During Treatment: Gait belt Activity Tolerance: Patient tolerated treatment well Patient left: with call bell/phone within reach;in bed;with bed alarm set;with family/visitor present Nurse Communication: Mobility status PT Visit Diagnosis: Difficulty in walking, not elsewhere classified (R26.2)    Time: 7408-1448 PT Time Calculation (min) (ACUTE ONLY): 20 min   Charges:   PT Evaluation $PT Eval Low Complexity: 1 Low          Andy Moye, PT   Acute Rehab Dept [facility]): [phone]   12/03/2019   [facility] 12/03/2019, 4:37 PM

## 2019-12-03 NOTE — Plan of Care (Signed)
Discharge instructions reviewed with patient, questions answered, verbalized understanding.  Patient ambulatory to main entrance to drive self home.

## 2019-12-10 DIAGNOSIS — R519 Headache, unspecified: Secondary | ICD-10-CM | POA: Diagnosis not present

## 2019-12-10 DIAGNOSIS — I161 Hypertensive emergency: Secondary | ICD-10-CM | POA: Diagnosis not present

## 2019-12-10 DIAGNOSIS — Z6841 Body Mass Index (BMI) 40.0 and over, adult: Secondary | ICD-10-CM | POA: Diagnosis not present

## 2019-12-10 DIAGNOSIS — G43109 Migraine with aura, not intractable, without status migrainosus: Secondary | ICD-10-CM | POA: Diagnosis not present

## 2019-12-11 ENCOUNTER — Other Ambulatory Visit: Payer: Self-pay | Admitting: Internal Medicine

## 2019-12-11 DIAGNOSIS — I161 Hypertensive emergency: Secondary | ICD-10-CM

## 2019-12-12 DIAGNOSIS — G43109 Migraine with aura, not intractable, without status migrainosus: Secondary | ICD-10-CM | POA: Insufficient documentation

## 2019-12-12 DIAGNOSIS — I161 Hypertensive emergency: Secondary | ICD-10-CM | POA: Diagnosis not present

## 2019-12-12 DIAGNOSIS — I10 Essential (primary) hypertension: Secondary | ICD-10-CM | POA: Diagnosis not present

## 2019-12-12 DIAGNOSIS — E1121 Type 2 diabetes mellitus with diabetic nephropathy: Secondary | ICD-10-CM | POA: Diagnosis not present

## 2019-12-12 DIAGNOSIS — F4489 Other dissociative and conversion disorders: Secondary | ICD-10-CM | POA: Diagnosis not present

## 2019-12-13 ENCOUNTER — Ambulatory Visit: Payer: BC Managed Care – PPO | Admitting: Podiatry

## 2019-12-17 ENCOUNTER — Ambulatory Visit
Admission: RE | Admit: 2019-12-17 | Discharge: 2019-12-17 | Disposition: A | Discharge status: Home / Self Care | Payer: BC Managed Care – PPO | Source: Ambulatory Visit | Attending: Internal Medicine | Admitting: Internal Medicine

## 2019-12-17 DIAGNOSIS — I161 Hypertensive emergency: Secondary | ICD-10-CM

## 2019-12-17 DIAGNOSIS — N281 Cyst of kidney, acquired: Secondary | ICD-10-CM | POA: Diagnosis not present

## 2020-01-06 ENCOUNTER — Emergency Department (HOSPITAL_COMMUNITY)
Admission: EM | Admit: 2020-01-06 | Discharge: 2020-01-06 | Disposition: A | Discharge status: Home / Self Care | Payer: BC Managed Care – PPO | Attending: Emergency Medicine | Admitting: Emergency Medicine

## 2020-01-06 ENCOUNTER — Other Ambulatory Visit: Payer: Self-pay

## 2020-01-06 ENCOUNTER — Encounter (HOSPITAL_COMMUNITY): Payer: Self-pay | Admitting: Emergency Medicine

## 2020-01-06 DIAGNOSIS — M549 Dorsalgia, unspecified: Secondary | ICD-10-CM | POA: Insufficient documentation

## 2020-01-06 DIAGNOSIS — M545 Low back pain: Secondary | ICD-10-CM | POA: Diagnosis not present

## 2020-01-06 DIAGNOSIS — Z5321 Procedure and treatment not carried out due to patient leaving prior to being seen by health care provider: Secondary | ICD-10-CM | POA: Diagnosis not present

## 2020-01-06 NOTE — ED Triage Notes (Signed)
Pt. Stated, I was in [place] and I fell and hit my back.

## 2020-01-08 ENCOUNTER — Ambulatory Visit
Admission: RE | Admit: 2020-01-08 | Discharge: 2020-01-08 | Disposition: A | Discharge status: Home / Self Care | Payer: BC Managed Care – PPO | Source: Ambulatory Visit | Attending: Internal Medicine | Admitting: Internal Medicine

## 2020-01-08 ENCOUNTER — Other Ambulatory Visit: Payer: Self-pay | Admitting: Internal Medicine

## 2020-01-08 ENCOUNTER — Other Ambulatory Visit: Payer: Self-pay

## 2020-01-08 DIAGNOSIS — M545 Low back pain, unspecified: Secondary | ICD-10-CM

## 2020-01-08 DIAGNOSIS — S3992XA Unspecified injury of lower back, initial encounter: Secondary | ICD-10-CM | POA: Diagnosis not present

## 2020-01-08 DIAGNOSIS — M533 Sacrococcygeal disorders, not elsewhere classified: Secondary | ICD-10-CM

## 2020-01-15 DIAGNOSIS — N189 Chronic kidney disease, unspecified: Secondary | ICD-10-CM | POA: Diagnosis not present

## 2020-01-15 DIAGNOSIS — R809 Proteinuria, unspecified: Secondary | ICD-10-CM | POA: Diagnosis not present

## 2020-01-15 DIAGNOSIS — E559 Vitamin D deficiency, unspecified: Secondary | ICD-10-CM | POA: Diagnosis not present

## 2020-01-15 DIAGNOSIS — D631 Anemia in chronic kidney disease: Secondary | ICD-10-CM | POA: Diagnosis not present

## 2020-01-15 DIAGNOSIS — I129 Hypertensive chronic kidney disease with stage 1 through stage 4 chronic kidney disease, or unspecified chronic kidney disease: Secondary | ICD-10-CM | POA: Diagnosis not present

## 2020-01-15 DIAGNOSIS — N1832 Chronic kidney disease, stage 3b: Secondary | ICD-10-CM | POA: Diagnosis not present

## 2020-01-27 ENCOUNTER — Other Ambulatory Visit (HOSPITAL_COMMUNITY): Payer: Self-pay | Admitting: Nephrology

## 2020-01-27 DIAGNOSIS — R809 Proteinuria, unspecified: Secondary | ICD-10-CM

## 2020-01-30 ENCOUNTER — Other Ambulatory Visit (HOSPITAL_COMMUNITY): Payer: Self-pay

## 2020-01-30 ENCOUNTER — Other Ambulatory Visit: Payer: Self-pay | Admitting: Radiology

## 2020-01-30 ENCOUNTER — Other Ambulatory Visit (HOSPITAL_COMMUNITY): Payer: Self-pay | Admitting: *Deleted

## 2020-01-31 ENCOUNTER — Other Ambulatory Visit: Payer: Self-pay | Admitting: Student

## 2020-01-31 ENCOUNTER — Ambulatory Visit (HOSPITAL_COMMUNITY)
Admission: RE | Admit: 2020-01-31 | Discharge: 2020-01-31 | Disposition: A | Discharge status: Home / Self Care | Payer: BC Managed Care – PPO | Source: Ambulatory Visit | Attending: Nephrology | Admitting: Nephrology

## 2020-01-31 ENCOUNTER — Other Ambulatory Visit: Payer: Self-pay

## 2020-01-31 ENCOUNTER — Other Ambulatory Visit: Payer: Self-pay | Admitting: Radiology

## 2020-01-31 VITALS — BP 163/88 | HR 105 | Temp 97.2°F | Resp 18

## 2020-01-31 DIAGNOSIS — N170 Acute kidney failure with tubular necrosis: Secondary | ICD-10-CM | POA: Insufficient documentation

## 2020-01-31 DIAGNOSIS — N183 Chronic kidney disease, stage 3 unspecified: Secondary | ICD-10-CM | POA: Insufficient documentation

## 2020-01-31 LAB — POCT HEMOGLOBIN-HEMACUE: Hemoglobin: 9.5 g/dL — ABNORMAL LOW (ref 12.0–15.0)

## 2020-01-31 MED ORDER — EPOETIN ALFA-EPBX 2000 UNIT/ML IJ SOLN
INTRAMUSCULAR | Status: AC
  Administered 2020-01-31: 2000 [IU]
  Filled 2020-01-31: qty 1

## 2020-01-31 MED ORDER — EPOETIN ALFA-EPBX 3000 UNIT/ML IJ SOLN
INTRAMUSCULAR | Status: AC
  Administered 2020-01-31: 3000 [IU]
  Filled 2020-01-31: qty 1

## 2020-01-31 MED ORDER — EPOETIN ALFA-EPBX 10000 UNIT/ML IJ SOLN: 15000.0000 [IU] | INTRAMUSCULAR | Status: DC

## 2020-01-31 MED ORDER — EPOETIN ALFA-EPBX 10000 UNIT/ML IJ SOLN
INTRAMUSCULAR | Status: AC
  Administered 2020-01-31: 10000 [IU] via SUBCUTANEOUS
  Filled 2020-01-31: qty 1

## 2020-01-31 NOTE — Discharge Instructions (Signed)

## 2020-02-03 ENCOUNTER — Ambulatory Visit (HOSPITAL_COMMUNITY)
Admission: RE | Admit: 2020-02-03 | Discharge: 2020-02-03 | Disposition: A | Discharge status: Home / Self Care | Payer: BC Managed Care – PPO | Source: Ambulatory Visit | Attending: Nephrology | Admitting: Nephrology

## 2020-02-03 ENCOUNTER — Encounter (HOSPITAL_COMMUNITY): Payer: Self-pay

## 2020-02-03 ENCOUNTER — Other Ambulatory Visit: Payer: Self-pay

## 2020-02-03 DIAGNOSIS — F419 Anxiety disorder, unspecified: Secondary | ICD-10-CM | POA: Insufficient documentation

## 2020-02-03 DIAGNOSIS — R809 Proteinuria, unspecified: Secondary | ICD-10-CM | POA: Diagnosis not present

## 2020-02-03 DIAGNOSIS — Z6836 Body mass index (BMI) 36.0-36.9, adult: Secondary | ICD-10-CM | POA: Diagnosis not present

## 2020-02-03 DIAGNOSIS — E1122 Type 2 diabetes mellitus with diabetic chronic kidney disease: Secondary | ICD-10-CM | POA: Insufficient documentation

## 2020-02-03 DIAGNOSIS — I129 Hypertensive chronic kidney disease with stage 1 through stage 4 chronic kidney disease, or unspecified chronic kidney disease: Secondary | ICD-10-CM | POA: Insufficient documentation

## 2020-02-03 DIAGNOSIS — N183 Chronic kidney disease, stage 3 unspecified: Secondary | ICD-10-CM | POA: Diagnosis not present

## 2020-02-03 DIAGNOSIS — Z79899 Other long term (current) drug therapy: Secondary | ICD-10-CM | POA: Diagnosis not present

## 2020-02-03 DIAGNOSIS — Z841 Family history of disorders of kidney and ureter: Secondary | ICD-10-CM | POA: Diagnosis not present

## 2020-02-03 DIAGNOSIS — I480 Paroxysmal atrial fibrillation: Secondary | ICD-10-CM | POA: Insufficient documentation

## 2020-02-03 DIAGNOSIS — E1140 Type 2 diabetes mellitus with diabetic neuropathy, unspecified: Secondary | ICD-10-CM | POA: Insufficient documentation

## 2020-02-03 DIAGNOSIS — N261 Atrophy of kidney (terminal): Secondary | ICD-10-CM | POA: Diagnosis not present

## 2020-02-03 DIAGNOSIS — Z833 Family history of diabetes mellitus: Secondary | ICD-10-CM | POA: Insufficient documentation

## 2020-02-03 DIAGNOSIS — G4730 Sleep apnea, unspecified: Secondary | ICD-10-CM | POA: Insufficient documentation

## 2020-02-03 DIAGNOSIS — N119 Chronic tubulo-interstitial nephritis, unspecified: Secondary | ICD-10-CM | POA: Insufficient documentation

## 2020-02-03 DIAGNOSIS — Z9884 Bariatric surgery status: Secondary | ICD-10-CM | POA: Insufficient documentation

## 2020-02-03 DIAGNOSIS — Z8249 Family history of ischemic heart disease and other diseases of the circulatory system: Secondary | ICD-10-CM | POA: Diagnosis not present

## 2020-02-03 DIAGNOSIS — E1121 Type 2 diabetes mellitus with diabetic nephropathy: Secondary | ICD-10-CM | POA: Diagnosis not present

## 2020-02-03 DIAGNOSIS — N2889 Other specified disorders of kidney and ureter: Secondary | ICD-10-CM | POA: Diagnosis not present

## 2020-02-03 LAB — CBC
HCT: 28.3 % — ABNORMAL LOW (ref 36.0–46.0)
Hemoglobin: 9.2 g/dL — ABNORMAL LOW (ref 12.0–15.0)
MCH: 29.9 pg (ref 26.0–34.0)
MCHC: 32.5 g/dL (ref 30.0–36.0)
MCV: 91.9 fL (ref 80.0–100.0)
Platelets: 308 10*3/uL (ref 150–400)
RBC: 3.08 MIL/uL — ABNORMAL LOW (ref 3.87–5.11)
RDW: 13 % (ref 11.5–15.5)
WBC: 7.8 10*3/uL (ref 4.0–10.5)
nRBC: 0 % (ref 0.0–0.2)

## 2020-02-03 LAB — GLUCOSE, CAPILLARY: Glucose-Capillary: 151 mg/dL — ABNORMAL HIGH (ref 70–99)

## 2020-02-03 LAB — PROTIME-INR
INR: 1 (ref 0.8–1.2)
Prothrombin Time: 12.9 seconds (ref 11.4–15.2)

## 2020-02-03 LAB — PREGNANCY, URINE: Preg Test, Ur: NEGATIVE

## 2020-02-03 MED ORDER — SODIUM CHLORIDE 0.9 % IV SOLN
INTRAVENOUS | Status: AC | PRN
  Administered 2020-02-03: 10 mL/h via INTRAVENOUS

## 2020-02-03 MED ORDER — SODIUM CHLORIDE 0.9 % IV SOLN: INTRAVENOUS | Status: DC

## 2020-02-03 MED ORDER — GELATIN ABSORBABLE 12-7 MM EX MISC
CUTANEOUS | Status: AC
  Filled 2020-02-03: qty 1

## 2020-02-03 MED ORDER — MIDAZOLAM HCL 2 MG/2ML IJ SOLN
INTRAMUSCULAR | Status: AC | PRN
  Administered 2020-02-03 (×2): 0.5 mg via INTRAVENOUS
  Administered 2020-02-03: 1 mg via INTRAVENOUS

## 2020-02-03 MED ORDER — LIDOCAINE HCL (PF) 1 % IJ SOLN
INTRAMUSCULAR | Status: AC
  Filled 2020-02-03: qty 30

## 2020-02-03 MED ORDER — LABETALOL HCL 5 MG/ML IV SOLN
10.0000 mg | Freq: Once | INTRAVENOUS | Status: DC
  Filled 2020-02-03: qty 4

## 2020-02-03 MED ORDER — FENTANYL CITRATE (PF) 100 MCG/2ML IJ SOLN
INTRAMUSCULAR | Status: AC | PRN
  Administered 2020-02-03: 25 ug via INTRAVENOUS
  Administered 2020-02-03: 50 ug via INTRAVENOUS
  Administered 2020-02-03: 25 ug via INTRAVENOUS

## 2020-02-03 MED ORDER — MIDAZOLAM HCL 2 MG/2ML IJ SOLN
INTRAMUSCULAR | Status: AC
  Filled 2020-02-03: qty 2

## 2020-02-03 MED ORDER — FENTANYL CITRATE (PF) 100 MCG/2ML IJ SOLN
INTRAMUSCULAR | Status: AC
  Filled 2020-02-03: qty 2

## 2020-02-03 NOTE — Discharge Instructions (Addendum)
Percutaneous Kidney Biopsy, Care After This sheet gives you information about how to care for yourself after your procedure. Your health care provider may also give you more specific instructions. If you have problems or questions, contact your health care provider. What can I expect after the procedure? After the procedure, it is common to have:  Pain or soreness near the biopsy site.  Pink or cloudy urine for 24 hours after the procedure. Follow these instructions at home: Activity  Return to your normal activities as told by your health care provider. Ask your health care provider what activities are safe for you.  If you were given a sedative during the procedure, it can affect you for several hours. Do not drive or operate machinery until your health care provider says that it is safe.  Do not lift anything that is heavier than 10 lb (4.5 kg), or the limit that you are told, until your health care provider says that it is safe.  Avoid activities that take a lot of effort (are strenuous) until your health care provider approves. Most people will have to wait 2 weeks before returning to activities such as exercise or sex. General instructions   Take over-the-counter and prescription medicines only as told by your health care provider.  You may eat and drink after your procedure. Follow instructions from your health care provider about eating or drinking restrictions.  Check your biopsy site every day for signs of infection. Check for: ? More redness, swelling, or pain. ? Fluid or blood. ? Warmth. ? Pus or a bad smell.  Keep all follow-up visits as told by your health care provider. This is important. Contact a health care provider if:  You have more redness, swelling, or pain around your biopsy site.  You have fluid or blood coming from your biopsy site.  Your biopsy site feels warm to the touch.  You have pus or a bad smell coming from your biopsy site.  You have blood  in your urine more than 24 hours after your procedure.  You have a fever. Get help right away if:  Your urine is dark red or brown.  You cannot urinate.  It burns when you urinate.  You feel dizzy or light-headed.  You have severe pain in your abdomen or side. Summary  After the procedure, it is common to have pain or soreness at the biopsy site and pink or cloudy urine for the first 24 hours.  Check your biopsy site each day for signs of infection, such as more redness, swelling, or pain; fluid, blood, pus or a bad smell coming from the biopsy site; or the biopsy site feeling warm to touch.  Return to your normal activities as told by your health care provider. This information is not intended to replace advice given to you by your health care provider. Make sure you discuss any questions you have with your health care provider. Document Revised: 03/07/2019 Document Reviewed: 03/07/2019 Elsevier Patient Education  2020 [employer]  

## 2020-02-03 NOTE — Procedures (Signed)
Interventional Radiology Procedure Note  Procedure: [place] LEFT RENAL RANDOM CORE BX  Complications: None  Estimated Blood Loss: MIN  Findings: 16 G CORES X 2

## 2020-02-03 NOTE — H&P (Signed)
Chief Complaint: Patient was seen in consultation today for renal biopsy  Referring Physician(s): Rosita Fire  Supervising Physician: Daryll Brod  Patient Status: [facility] - Out-pt  History of Present Illness: Jacqueline [name] is a 39 y.o. female with past medical historyofuncontrolled hypertension, diabetes type 2, morbid obesity, paroxysmal A. fib not on anticoagulation, diabetic neuropathy, and CKD stage IIIb.  She is referred to IR for random renal biopsy.   Patient presents to [facility] today in her usual state of health.  She has been NPO.  She does not take blood thinners.  She does take several anti-hypertensives and took her medications this AM.  She is agreeable to renal biopsy today.   Past Medical History:  Diagnosis Date  . Anemia   . Anxiety   . Chronic bronchitis ([place])   . Hypertension   . Increased frequency of headaches   . Morbid obesity ([place])   . Sleep apnea   . Type II diabetes mellitus ([place])     Past Surgical History:  Procedure Laterality Date  . BARIATRIC SURGERY    . BREAST REDUCTION SURGERY  03/21/2017  . CARDIAC CATHETERIZATION  01/06/2016  . CARDIAC CATHETERIZATION N/A 01/06/2016   Procedure: Left Heart Cath and Coronary Angiography;  Surgeon: Wellington Hampshire, MD;  Location: [place] [facility];  Service: Cardiovascular;  Laterality: N/A;  . CESAREAN SECTION  08/2014    Allergies: Patient has no known allergies.  Medications: Prior to Admission medications   Medication Sig Start Date End Date Taking? Authorizing Provider  amLODipine (NORVASC) 10 MG tablet Take 1 tablet (10 mg total) by mouth daily. 07/08/19  Yes Nita Sells, MD  atorvastatin (LIPITOR) 10 MG tablet Take 10 mg by mouth daily.  03/08/18  Yes [provider]  butalbital-acetaminophen-caffeine (FIORICET) 50-325-40 MG tablet Take 1 tablet by mouth every 4 (four) hours as needed for headache. 12/03/19  Yes Gherghe, Vella Redhead, MD  carvedilol (COREG) 25 MG  tablet Take 1 tablet (25 mg total) by mouth 2 (two) times daily with a meal. 05/06/18  Yes Lady Deutscher, MD  diclofenac Sodium (VOLTAREN) 1 % GEL Apply 2 g topically 4 (four) times daily as needed (pain).    Yes [provider]  furosemide (LASIX) 40 MG tablet Take 40 mg by mouth daily. 06/23/19  Yes [provider]  gabapentin (NEURONTIN) 100 MG capsule Take 100 mg by mouth 3 (three) times daily.    Yes [provider]  hydrALAZINE (APRESOLINE) 50 MG tablet Take 1 tablet (50 mg total) by mouth 3 (three) times daily. 07/07/19  Yes Nita Sells, MD  sertraline (ZOLOFT) 100 MG tablet Take 100 mg by mouth at bedtime. 04/30/19  Yes [provider]  Vitamin D, Ergocalciferol, (DRISDOL) 1.25 MG (50000 UNIT) CAPS capsule Take 50,000 Units by mouth every Thursday. 12/23/19  Yes [provider]  diphenhydrAMINE (BENADRYL) 25 mg capsule Take 1 capsule (25 mg total) by mouth every 6 (six) hours as needed (migraine headache). Patient not taking: Reported on 01/28/2020 12/03/19   Caren Griffins, MD  prochlorperazine (COMPAZINE) 10 MG tablet Take 1 tablet (10 mg total) by mouth every 6 (six) hours as needed (migraine headace). 12/03/19   Caren Griffins, MD     Family History  Problem Relation Age of Onset  . Diabetes Mother   . Hypertension Mother   . Thyroid disease Mother   . Kidney disease Maternal Grandmother   . Diabetes Maternal Grandmother   . Heart attack  Other     Social History   Socioeconomic History  . Marital status: Married    Spouse name: Not on file  . Number of children: Not on file  . Years of education: Not on file  . Highest education level: Not on file  Occupational History  . Not on file  Tobacco Use  . Smoking status: Never Smoker  . Smokeless tobacco: Never Used  Vaping Use  . Vaping Use: Never used  Substance and Sexual Activity  . Alcohol use: No  . Drug use: No  . Sexual activity: Yes    Birth  control/protection: None  Other Topics Concern  . Not on file  Social History Narrative  . Not on file   Social Determinants of Health   Financial Resource Strain:   . Difficulty of Paying Living Expenses:   Food Insecurity:   . Worried About Charity fundraiser in the Last Year:   . Arboriculturist in the Last Year:   Transportation Needs:   . Film/video editor (Medical):   Marland Kitchen Lack of Transportation (Non-Medical):   Physical Activity:   . Days of Exercise per Week:   . Minutes of Exercise per Session:   Stress:   . Feeling of Stress :   Social Connections:   . Frequency of Communication with Friends and Family:   . Frequency of Social Gatherings with Friends and Family:   . Attends Religious Services:   . Active Member of Clubs or Organizations:   . Attends Archivist Meetings:   Marland Kitchen Marital Status:      Review of Systems: A 12 point ROS discussed and pertinent positives are indicated in the HPI above.  All other systems are negative.  Review of Systems  Constitutional: Negative for fatigue and fever.  Respiratory: Negative for cough and shortness of breath.   Cardiovascular: Negative for chest pain.  Gastrointestinal: Negative for abdominal pain, nausea and vomiting.  Genitourinary: Negative for dysuria.  Musculoskeletal: Negative for back pain.  Neurological: Negative for dizziness, weakness and headaches.  Psychiatric/Behavioral: Negative for behavioral problems and confusion.    Vital Signs: BP 127/76   Pulse 80   Temp 98.7 F (37.1 C) (Oral)   Resp 16   Ht 5\' 5"  (1.651 m)   Wt 219 lb (99.3 kg)   LMP 01/07/2020 (Exact Date)   SpO2 100%   BMI 36.44 kg/m   Physical Exam Vitals and nursing note reviewed.  Constitutional:      Appearance: Normal appearance.  HENT:     Mouth/Throat:     Mouth: Mucous membranes are moist.     Pharynx: Oropharynx is clear.  Cardiovascular:     Rate and Rhythm: Normal rate and regular rhythm.  Pulmonary:      Effort: Pulmonary effort is normal. No respiratory distress.     Breath sounds: Normal breath sounds.  Abdominal:     General: Abdomen is flat.     Palpations: Abdomen is soft.  Skin:    General: Skin is warm and dry.  Neurological:     General: No focal deficit present.     Mental Status: She is alert and oriented to person, place, and time. Mental status is at baseline.  Psychiatric:        Mood and Affect: Mood normal.        Behavior: Behavior normal.        Thought Content: Thought content normal.  Judgment: Judgment normal.      MD Evaluation Airway: WNL Heart: WNL Abdomen: WNL Chest/ Lungs: WNL ASA  Classification: 3 Mallampati/Airway Score: One   Imaging: DG Lumbar Spine 2-3 Views  Result Date: 01/09/2020 CLINICAL DATA:  Status post fall 2 weeks ago.  Low back pain. EXAM: LUMBAR SPINE - 2-3 VIEW COMPARISON:  01/04/2011 FINDINGS: The alignment of the lumbar spine is normal. The vertebral body heights are well preserved. No fractures. Mild endplate spurring is identified at L4, L5 and S1. Progressive degenerative changes noted within the lower thoracic spine. IMPRESSION: 1. No acute findings. 2. Mild degenerative disc disease. Electronically Signed   By: Kerby Moors M.D.   On: 01/09/2020 16:39   DG Sacrum/Coccyx  Result Date: 01/09/2020 CLINICAL DATA:  Low back pain after fall 2 weeks ago. EXAM: SACRUM AND COCCYX - 2+ VIEW COMPARISON:  None. FINDINGS: There is no evidence of fracture or other focal bone lesions. IMPRESSION: Negative. Electronically Signed   By: Marijo Conception M.D.   On: 01/09/2020 16:39    Labs:  CBC: Recent Labs    10/01/19 0840 10/01/19 0840 12/02/19 3875 12/03/19 0345 01/31/20 0958 02/03/20 0620  WBC 8.6  --  8.4 7.4  --  7.8  HGB 9.1*   < > 9.7* 8.7* 9.5* 9.2*  HCT 29.3*  --  29.4* 26.7*  --  28.3*  PLT 376  --  306 276  --  308   < > = values in this interval not displayed.    COAGS: Recent Labs    12/02/19 0838  02/03/20 0620  INR 1.1 1.0  APTT 23*  --     BMP: Recent Labs    07/07/19 0734 10/01/19 0840 12/02/19 0838 12/03/19 0345  NA 140 142 143 143  K 3.8 4.4 4.2 4.1  CL 112* 112* 115* 117*  CO2 21* 21* 21* 19*  GLUCOSE 133* 160* 171* 129*  BUN 20 33* 35* 34*  CALCIUM 8.0* 8.3* 8.3* 8.3*  CREATININE 1.94* 2.18* 2.14* 2.25*  GFRNONAA 32* 28* 28* 27*  GFRAA 37* 32* 33* 31*    LIVER FUNCTION TESTS: Recent Labs    03/15/19 1559 06/24/19 0830 07/07/19 0734 12/02/19 0838  BILITOT 0.7 0.5 0.2* 0.7  AST 39 28 35 32  ALT 26 27 28 27   ALKPHOS 96 114 97 97  PROT 6.8 6.3* 5.4* 6.4*  ALBUMIN 2.9* 2.3* 2.0* 2.9*    TUMOR MARKERS: No results for input(s): AFPTM, CEA, CA199, CHROMGRNA in the last 8760 hours.  Assessment and Plan: Patient with past medical history of DM, uncontrolled HTN, CKD Stage 3 presents for random renal biopsy at the request of Dr. Carolin Sicks. Case reviewed by Dr. Annamaria Boots who approves patient for procedure.  Patient presents today in their usual state of health.  She has been NPO and is not currently on blood thinners.    Risks and benefits of biopsy was discussed with the patient and/or patient's family including, but not limited to bleeding, infection, damage to adjacent structures or low yield requiring additional tests.  All of the questions were answered and there is agreement to proceed.  Consent signed and in chart.  Thank you for this interesting consult.  I greatly enjoyed meeting [facility] and look forward to participating in their care.  A copy of this report was sent to the requesting provider on this date.  Electronically Signed: Docia Barrier, PA 02/03/2020, 8:01 AM   I spent a total of  57 Minutes   in face to face in clinical consultation, greater than 50% of which was counseling/coordinating care for chronic kidney disease.

## 2020-02-13 DIAGNOSIS — L0293 Carbuncle, unspecified: Secondary | ICD-10-CM | POA: Diagnosis not present

## 2020-02-13 DIAGNOSIS — E1165 Type 2 diabetes mellitus with hyperglycemia: Secondary | ICD-10-CM | POA: Diagnosis not present

## 2020-02-13 DIAGNOSIS — R519 Headache, unspecified: Secondary | ICD-10-CM | POA: Diagnosis not present

## 2020-02-13 DIAGNOSIS — E1121 Type 2 diabetes mellitus with diabetic nephropathy: Secondary | ICD-10-CM | POA: Diagnosis not present

## 2020-02-13 DIAGNOSIS — I161 Hypertensive emergency: Secondary | ICD-10-CM | POA: Diagnosis not present

## 2020-02-17 DIAGNOSIS — I10 Essential (primary) hypertension: Secondary | ICD-10-CM | POA: Diagnosis not present

## 2020-02-17 DIAGNOSIS — E1165 Type 2 diabetes mellitus with hyperglycemia: Secondary | ICD-10-CM | POA: Diagnosis not present

## 2020-02-17 DIAGNOSIS — Z20822 Contact with and (suspected) exposure to covid-19: Secondary | ICD-10-CM | POA: Diagnosis not present

## 2020-02-17 DIAGNOSIS — L0293 Carbuncle, unspecified: Secondary | ICD-10-CM | POA: Diagnosis not present

## 2020-02-19 LAB — SURGICAL PATHOLOGY

## 2020-02-21 ENCOUNTER — Encounter (HOSPITAL_COMMUNITY): Payer: Self-pay

## 2020-02-28 ENCOUNTER — Inpatient Hospital Stay (HOSPITAL_COMMUNITY): Admission: RE | Admit: 2020-02-28 | Payer: BC Managed Care – PPO | Source: Ambulatory Visit

## 2020-03-27 ENCOUNTER — Encounter (HOSPITAL_COMMUNITY): Payer: BC Managed Care – PPO

## 2020-08-17 ENCOUNTER — Other Ambulatory Visit: Payer: Self-pay

## 2020-08-17 ENCOUNTER — Emergency Department (HOSPITAL_COMMUNITY): Payer: BC Managed Care – PPO

## 2020-08-17 ENCOUNTER — Inpatient Hospital Stay (HOSPITAL_COMMUNITY): Payer: BC Managed Care – PPO

## 2020-08-17 ENCOUNTER — Inpatient Hospital Stay (HOSPITAL_COMMUNITY)
Admission: EM | Admit: 2020-08-17 | Discharge: 2020-08-19 | DRG: 872 | Disposition: A | Discharge status: Home / Self Care | Payer: BC Managed Care – PPO | Attending: Internal Medicine | Admitting: Internal Medicine

## 2020-08-17 ENCOUNTER — Encounter (HOSPITAL_COMMUNITY): Payer: Self-pay

## 2020-08-17 DIAGNOSIS — Z411 Encounter for cosmetic surgery: Secondary | ICD-10-CM

## 2020-08-17 DIAGNOSIS — R8271 Bacteriuria: Secondary | ICD-10-CM

## 2020-08-17 DIAGNOSIS — F32A Depression, unspecified: Secondary | ICD-10-CM | POA: Diagnosis present

## 2020-08-17 DIAGNOSIS — I129 Hypertensive chronic kidney disease with stage 1 through stage 4 chronic kidney disease, or unspecified chronic kidney disease: Secondary | ICD-10-CM | POA: Diagnosis not present

## 2020-08-17 DIAGNOSIS — I480 Paroxysmal atrial fibrillation: Secondary | ICD-10-CM | POA: Diagnosis present

## 2020-08-17 DIAGNOSIS — I160 Hypertensive urgency: Secondary | ICD-10-CM | POA: Diagnosis not present

## 2020-08-17 DIAGNOSIS — N390 Urinary tract infection, site not specified: Secondary | ICD-10-CM | POA: Diagnosis not present

## 2020-08-17 DIAGNOSIS — R079 Chest pain, unspecified: Secondary | ICD-10-CM | POA: Diagnosis not present

## 2020-08-17 DIAGNOSIS — F411 Generalized anxiety disorder: Secondary | ICD-10-CM | POA: Diagnosis present

## 2020-08-17 DIAGNOSIS — E1142 Type 2 diabetes mellitus with diabetic polyneuropathy: Secondary | ICD-10-CM | POA: Diagnosis present

## 2020-08-17 DIAGNOSIS — E1122 Type 2 diabetes mellitus with diabetic chronic kidney disease: Secondary | ICD-10-CM | POA: Diagnosis not present

## 2020-08-17 DIAGNOSIS — N179 Acute kidney failure, unspecified: Secondary | ICD-10-CM | POA: Diagnosis not present

## 2020-08-17 DIAGNOSIS — Z20822 Contact with and (suspected) exposure to covid-19: Secondary | ICD-10-CM | POA: Diagnosis present

## 2020-08-17 DIAGNOSIS — A419 Sepsis, unspecified organism: Secondary | ICD-10-CM | POA: Diagnosis not present

## 2020-08-17 DIAGNOSIS — R6520 Severe sepsis without septic shock: Secondary | ICD-10-CM

## 2020-08-17 DIAGNOSIS — G4733 Obstructive sleep apnea (adult) (pediatric): Secondary | ICD-10-CM | POA: Diagnosis present

## 2020-08-17 DIAGNOSIS — R197 Diarrhea, unspecified: Secondary | ICD-10-CM | POA: Diagnosis present

## 2020-08-17 DIAGNOSIS — E8809 Other disorders of plasma-protein metabolism, not elsewhere classified: Secondary | ICD-10-CM | POA: Diagnosis not present

## 2020-08-17 DIAGNOSIS — Z79891 Long term (current) use of opiate analgesic: Secondary | ICD-10-CM | POA: Diagnosis not present

## 2020-08-17 DIAGNOSIS — G4489 Other headache syndrome: Secondary | ICD-10-CM | POA: Diagnosis not present

## 2020-08-17 DIAGNOSIS — Z8249 Family history of ischemic heart disease and other diseases of the circulatory system: Secondary | ICD-10-CM

## 2020-08-17 DIAGNOSIS — D638 Anemia in other chronic diseases classified elsewhere: Secondary | ICD-10-CM | POA: Diagnosis present

## 2020-08-17 DIAGNOSIS — R0602 Shortness of breath: Secondary | ICD-10-CM | POA: Diagnosis not present

## 2020-08-17 DIAGNOSIS — R7989 Other specified abnormal findings of blood chemistry: Secondary | ICD-10-CM

## 2020-08-17 DIAGNOSIS — R6889 Other general symptoms and signs: Secondary | ICD-10-CM

## 2020-08-17 DIAGNOSIS — B349 Viral infection, unspecified: Secondary | ICD-10-CM | POA: Diagnosis not present

## 2020-08-17 DIAGNOSIS — N183 Chronic kidney disease, stage 3 unspecified: Secondary | ICD-10-CM | POA: Diagnosis not present

## 2020-08-17 DIAGNOSIS — E785 Hyperlipidemia, unspecified: Secondary | ICD-10-CM | POA: Diagnosis present

## 2020-08-17 DIAGNOSIS — R0600 Dyspnea, unspecified: Secondary | ICD-10-CM | POA: Diagnosis not present

## 2020-08-17 DIAGNOSIS — Z79899 Other long term (current) drug therapy: Secondary | ICD-10-CM

## 2020-08-17 DIAGNOSIS — Z6835 Body mass index (BMI) 35.0-35.9, adult: Secondary | ICD-10-CM | POA: Diagnosis not present

## 2020-08-17 DIAGNOSIS — Z8349 Family history of other endocrine, nutritional and metabolic diseases: Secondary | ICD-10-CM

## 2020-08-17 DIAGNOSIS — R0789 Other chest pain: Secondary | ICD-10-CM | POA: Diagnosis not present

## 2020-08-17 DIAGNOSIS — I5032 Chronic diastolic (congestive) heart failure: Secondary | ICD-10-CM | POA: Diagnosis present

## 2020-08-17 DIAGNOSIS — Z20828 Contact with and (suspected) exposure to other viral communicable diseases: Secondary | ICD-10-CM | POA: Diagnosis not present

## 2020-08-17 DIAGNOSIS — Z833 Family history of diabetes mellitus: Secondary | ICD-10-CM | POA: Diagnosis not present

## 2020-08-17 DIAGNOSIS — D631 Anemia in chronic kidney disease: Secondary | ICD-10-CM | POA: Diagnosis present

## 2020-08-17 LAB — URINALYSIS, ROUTINE W REFLEX MICROSCOPIC
Bilirubin Urine: NEGATIVE
Glucose, UA: NEGATIVE mg/dL
Ketones, ur: NEGATIVE mg/dL
Leukocytes,Ua: NEGATIVE
Nitrite: POSITIVE — AB
Protein, ur: >300 mg/dL — AB
Specific Gravity, Urine: 1.02 (ref 1.005–1.030)
pH: 6 (ref 5.0–8.0)

## 2020-08-17 LAB — CBC WITH DIFFERENTIAL/PLATELET
Abs Immature Granulocytes: 0.03 10*3/uL (ref 0.00–0.07)
Basophils Absolute: 0 10*3/uL (ref 0.0–0.1)
Basophils Relative: 0 %
Eosinophils Absolute: 0.1 10*3/uL (ref 0.0–0.5)
Eosinophils Relative: 1 %
HCT: 29.6 % — ABNORMAL LOW (ref 36.0–46.0)
Hemoglobin: 9.5 g/dL — ABNORMAL LOW (ref 12.0–15.0)
Immature Granulocytes: 0 %
Lymphocytes Relative: 14 %
Lymphs Abs: 1.3 10*3/uL (ref 0.7–4.0)
MCH: 29.5 pg (ref 26.0–34.0)
MCHC: 32.1 g/dL (ref 30.0–36.0)
MCV: 91.9 fL (ref 80.0–100.0)
Monocytes Absolute: 0.8 10*3/uL (ref 0.1–1.0)
Monocytes Relative: 8 %
Neutro Abs: 7.5 10*3/uL (ref 1.7–7.7)
Neutrophils Relative %: 77 %
Platelets: 282 10*3/uL (ref 150–400)
RBC: 3.22 MIL/uL — ABNORMAL LOW (ref 3.87–5.11)
RDW: 13.1 % (ref 11.5–15.5)
WBC: 9.8 10*3/uL (ref 4.0–10.5)
nRBC: 0 % (ref 0.0–0.2)

## 2020-08-17 LAB — PROTIME-INR
INR: 1.1 (ref 0.8–1.2)
Prothrombin Time: 13.3 seconds (ref 11.4–15.2)

## 2020-08-17 LAB — URINALYSIS, MICROSCOPIC (REFLEX)

## 2020-08-17 LAB — COMPREHENSIVE METABOLIC PANEL
ALT: 27 U/L (ref 0–44)
AST: 24 U/L (ref 15–41)
Albumin: 2.5 g/dL — ABNORMAL LOW (ref 3.5–5.0)
Alkaline Phosphatase: 80 U/L (ref 38–126)
Anion gap: 11 (ref 5–15)
BUN: 34 mg/dL — ABNORMAL HIGH (ref 6–20)
CO2: 17 mmol/L — ABNORMAL LOW (ref 22–32)
Calcium: 8.3 mg/dL — ABNORMAL LOW (ref 8.9–10.3)
Chloride: 111 mmol/L (ref 98–111)
Creatinine, Ser: 3.14 mg/dL — ABNORMAL HIGH (ref 0.44–1.00)
GFR, Estimated: 19 mL/min — ABNORMAL LOW (ref >60)
Glucose, Bld: 125 mg/dL — ABNORMAL HIGH (ref 70–99)
Potassium: 3.6 mmol/L (ref 3.5–5.1)
Sodium: 139 mmol/L (ref 135–145)
Total Bilirubin: 1 mg/dL (ref 0.3–1.2)
Total Protein: 6 g/dL — ABNORMAL LOW (ref 6.5–8.1)

## 2020-08-17 LAB — APTT: aPTT: 25 seconds (ref 24–36)

## 2020-08-17 LAB — POC SARS CORONAVIRUS 2 AG -  ED: SARS Coronavirus 2 Ag: NEGATIVE

## 2020-08-17 LAB — FERRITIN: Ferritin: 98 ng/mL (ref 11–307)

## 2020-08-17 LAB — D-DIMER, QUANTITATIVE: D-Dimer, Quant: 3.89 ug/mL-FEU — ABNORMAL HIGH (ref 0.00–0.50)

## 2020-08-17 LAB — HEPATITIS B SURFACE ANTIGEN: Hepatitis B Surface Ag: NONREACTIVE

## 2020-08-17 LAB — TROPONIN I (HIGH SENSITIVITY)
Troponin I (High Sensitivity): 15 ng/L (ref <18)
Troponin I (High Sensitivity): 14 ng/L (ref <18)

## 2020-08-17 LAB — PROCALCITONIN: Procalcitonin: 0.31 ng/mL

## 2020-08-17 LAB — HIV ANTIBODY (ROUTINE TESTING W REFLEX): HIV Screen 4th Generation wRfx: NONREACTIVE

## 2020-08-17 LAB — LACTIC ACID, PLASMA: Lactic Acid, Venous: 0.9 mmol/L (ref 0.5–1.9)

## 2020-08-17 LAB — C-REACTIVE PROTEIN: CRP: 7.3 mg/dL — ABNORMAL HIGH (ref <1.0)

## 2020-08-17 LAB — SARS CORONAVIRUS 2 (TAT 6-24 HRS): SARS Coronavirus 2: NEGATIVE

## 2020-08-17 LAB — LACTATE DEHYDROGENASE: LDH: 232 U/L — ABNORMAL HIGH (ref 98–192)

## 2020-08-17 LAB — BRAIN NATRIURETIC PEPTIDE: B Natriuretic Peptide: 129.7 pg/mL — ABNORMAL HIGH (ref 0.0–100.0)

## 2020-08-17 LAB — FIBRINOGEN: Fibrinogen: 606 mg/dL — ABNORMAL HIGH (ref 210–475)

## 2020-08-17 MED ORDER — ONDANSETRON HCL 4 MG/2ML IJ SOLN: 4.0000 mg | Freq: Four times a day (QID) | INTRAMUSCULAR | Status: DC | PRN

## 2020-08-17 MED ORDER — LACTATED RINGERS IV BOLUS (SEPSIS): 800.0000 mL | Freq: Once | INTRAVENOUS | Status: DC

## 2020-08-17 MED ORDER — ALBUTEROL SULFATE HFA 108 (90 BASE) MCG/ACT IN AERS
2.0000 | INHALATION_SPRAY | Freq: Four times a day (QID) | RESPIRATORY_TRACT | Status: DC | PRN
  Filled 2020-08-17: qty 6.7

## 2020-08-17 MED ORDER — SODIUM CHLORIDE 0.9% FLUSH
3.0000 mL | Freq: Two times a day (BID) | INTRAVENOUS | Status: DC
  Administered 2020-08-17 – 2020-08-19 (×5): 3 mL via INTRAVENOUS

## 2020-08-17 MED ORDER — ACETAMINOPHEN 325 MG PO TABS
650.0000 mg | ORAL_TABLET | Freq: Four times a day (QID) | ORAL | Status: DC | PRN
  Administered 2020-08-17: 650 mg via ORAL
  Filled 2020-08-17 (×2): qty 2

## 2020-08-17 MED ORDER — HEPARIN SODIUM (PORCINE) 5000 UNIT/ML IJ SOLN
5000.0000 [IU] | Freq: Three times a day (TID) | INTRAMUSCULAR | Status: DC
  Administered 2020-08-17 – 2020-08-19 (×6): 5000 [IU] via SUBCUTANEOUS
  Filled 2020-08-17 (×6): qty 1

## 2020-08-17 MED ORDER — LACTATED RINGERS IV SOLN: INTRAVENOUS | Status: DC

## 2020-08-17 MED ORDER — ZINC SULFATE 220 (50 ZN) MG PO CAPS
220.0000 mg | ORAL_CAPSULE | Freq: Every day | ORAL | Status: DC
  Administered 2020-08-17 – 2020-08-19 (×3): 220 mg via ORAL
  Filled 2020-08-17 (×3): qty 1

## 2020-08-17 MED ORDER — FENTANYL CITRATE (PF) 100 MCG/2ML IJ SOLN
50.0000 ug | Freq: Once | INTRAMUSCULAR | Status: AC
  Administered 2020-08-17: 50 ug via INTRAVENOUS
  Filled 2020-08-17: qty 2

## 2020-08-17 MED ORDER — AMLODIPINE BESYLATE 10 MG PO TABS
10.0000 mg | ORAL_TABLET | Freq: Every day | ORAL | Status: DC
Start: 2020-08-17 — End: 2020-08-19
  Administered 2020-08-17 – 2020-08-18 (×2): 10 mg via ORAL
  Filled 2020-08-17: qty 1
  Filled 2020-08-17 (×2): qty 2

## 2020-08-17 MED ORDER — BUTALBITAL-APAP-CAFFEINE 50-325-40 MG PO TABS
1.0000 | ORAL_TABLET | Freq: Once | ORAL | Status: AC
  Administered 2020-08-17: 1 via ORAL
  Filled 2020-08-17: qty 1

## 2020-08-17 MED ORDER — SODIUM CHLORIDE 0.9 % IV SOLN: 2.0000 g | INTRAVENOUS | Status: DC

## 2020-08-17 MED ORDER — SODIUM CHLORIDE 0.9 % IV SOLN
2.0000 g | Freq: Once | INTRAVENOUS | Status: AC
  Administered 2020-08-17: 2 g via INTRAVENOUS
  Filled 2020-08-17: qty 2

## 2020-08-17 MED ORDER — LACTATED RINGERS IV BOLUS (SEPSIS)
1000.0000 mL | Freq: Once | INTRAVENOUS | Status: AC
  Administered 2020-08-17: 1000 mL via INTRAVENOUS

## 2020-08-17 MED ORDER — HYDRALAZINE HCL 50 MG PO TABS
50.0000 mg | ORAL_TABLET | Freq: Three times a day (TID) | ORAL | Status: DC
Start: 2020-08-17 — End: 2020-08-19
  Administered 2020-08-17 – 2020-08-18 (×4): 50 mg via ORAL
  Filled 2020-08-17: qty 1
  Filled 2020-08-17: qty 2
  Filled 2020-08-17: qty 1
  Filled 2020-08-17: qty 2
  Filled 2020-08-17: qty 1
  Filled 2020-08-17: qty 2

## 2020-08-17 MED ORDER — ACETAMINOPHEN 325 MG PO TABS
650.0000 mg | ORAL_TABLET | Freq: Once | ORAL | Status: AC
  Administered 2020-08-17: 650 mg via ORAL
  Filled 2020-08-17: qty 2

## 2020-08-17 MED ORDER — SODIUM CHLORIDE 0.9 % IV SOLN
1.0000 g | INTRAVENOUS | Status: DC
  Administered 2020-08-18 – 2020-08-19 (×2): 1 g via INTRAVENOUS
  Filled 2020-08-17 (×2): qty 10

## 2020-08-17 MED ORDER — GABAPENTIN 100 MG PO CAPS
100.0000 mg | ORAL_CAPSULE | Freq: Every morning | ORAL | Status: DC
  Administered 2020-08-18 – 2020-08-19 (×2): 100 mg via ORAL
  Filled 2020-08-17 (×2): qty 1

## 2020-08-17 MED ORDER — DICLOFENAC SODIUM 1 % EX GEL
2.0000 g | Freq: Four times a day (QID) | CUTANEOUS | Status: DC | PRN
  Filled 2020-08-17: qty 100

## 2020-08-17 MED ORDER — ASCORBIC ACID 500 MG PO TABS
500.0000 mg | ORAL_TABLET | Freq: Every day | ORAL | Status: DC
  Administered 2020-08-17 – 2020-08-19 (×3): 500 mg via ORAL
  Filled 2020-08-17 (×3): qty 1

## 2020-08-17 MED ORDER — SERTRALINE HCL 100 MG PO TABS
100.0000 mg | ORAL_TABLET | Freq: Every day | ORAL | Status: DC
  Administered 2020-08-17 – 2020-08-18 (×2): 100 mg via ORAL
  Filled 2020-08-17 (×3): qty 1

## 2020-08-17 MED ORDER — VANCOMYCIN HCL IN DEXTROSE 1-5 GM/200ML-% IV SOLN: 1000.0000 mg | INTRAVENOUS | Status: DC

## 2020-08-17 MED ORDER — VANCOMYCIN HCL IN DEXTROSE 1-5 GM/200ML-% IV SOLN: 1000.0000 mg | Freq: Once | INTRAVENOUS | Status: DC

## 2020-08-17 MED ORDER — TECHNETIUM TO 99M ALBUMIN AGGREGATED
4.3000 | Freq: Once | INTRAVENOUS | Status: AC | PRN
  Administered 2020-08-17: 4.3 via INTRAVENOUS

## 2020-08-17 MED ORDER — LACTATED RINGERS IV SOLN: INTRAVENOUS | Status: AC

## 2020-08-17 MED ORDER — CARVEDILOL 25 MG PO TABS
25.0000 mg | ORAL_TABLET | Freq: Two times a day (BID) | ORAL | Status: DC
Start: 2020-08-17 — End: 2020-08-19
  Administered 2020-08-17 – 2020-08-19 (×4): 25 mg via ORAL
  Filled 2020-08-17: qty 8
  Filled 2020-08-17 (×2): qty 1
  Filled 2020-08-17: qty 8

## 2020-08-17 MED ORDER — METRONIDAZOLE IN NACL 5-0.79 MG/ML-% IV SOLN
500.0000 mg | Freq: Once | INTRAVENOUS | Status: AC
  Administered 2020-08-17: 500 mg via INTRAVENOUS
  Filled 2020-08-17: qty 100

## 2020-08-17 MED ORDER — ONDANSETRON HCL 4 MG PO TABS: 4.0000 mg | ORAL_TABLET | Freq: Four times a day (QID) | ORAL | Status: DC | PRN

## 2020-08-17 MED ORDER — VANCOMYCIN HCL 1500 MG/300ML IV SOLN
1500.0000 mg | Freq: Once | INTRAVENOUS | Status: AC
  Administered 2020-08-17: 1500 mg via INTRAVENOUS
  Filled 2020-08-17: qty 300

## 2020-08-17 MED ORDER — SODIUM CHLORIDE 0.9 % IV SOLN: 1.0000 g | INTRAVENOUS | Status: DC

## 2020-08-17 NOTE — ED Triage Notes (Signed)
EMS arrival from [facility] flu-like symptoms x4 days, SOB and sharp constant central chest pain radiating to left shoulder starting around 12am. Per EMS pt febrile 102.11F taking '500mg'$  Tylenol at 0030. Vitals 102.5, 215/109, 119 HR, 100% RA, A&Ox4. C

## 2020-08-17 NOTE — Progress Notes (Signed)
Bilateral lower extremity venous duplex has been completed. Preliminary results can be found in CV Proc through chart review.   08/17/20 3:25 PM Jacqueline [name] RVT

## 2020-08-17 NOTE — ED Provider Notes (Signed)
[facility] EMERGENCY DEPARTMENT Provider Note   CSN: [id number] Arrival date & time: 08/17/20  0435     History Chief Complaint  Patient presents with  . Chest Pain  . Shortness of Breath    Jacqueline [name] is a 40 y.o. female presents to the Emergency Department complaining of gradual, persistent, progressively worsening influenza-like illness onset 48 hours ago after trip to [place].  Patient reports she had fevers at home greater than 103.  She has associated headache, cough, myalgias.  Patient reports she is vaccinated for Covid.  No known contact with anyone with Covid.  Patient reports some decreased oral intake due to feeling so poorly.  States of Tylenol at 12:30 AM.  No other treatments prior to arrival.  Patient does report some neck soreness.  The history is provided by the patient and medical records. No language interpreter was used.       Past Medical History:  Diagnosis Date  . Anemia   . Anxiety   . Chronic bronchitis ([place])   . Hypertension   . Increased frequency of headaches   . Morbid obesity ([place])   . Sleep apnea   . Type II diabetes mellitus [facility])     Patient Active Problem List   Diagnosis Date Noted  . Right hemiplegia ([place]) 12/02/2019  . Hypertension   . AKI (acute kidney injury) ([place])   . CKD (chronic kidney disease) stage 3, GFR 30-59 ml/min   . Obesity, Class III, BMI 40-49.9 (morbid obesity) ([place])   . Insulin dependent diabetes mellitus   . Anemia of chronic disease   . HLD (hyperlipidemia) 07/02/2018  . Acute on chronic diastolic CHF (congestive heart failure) ([place]) 07/02/2018  . Depression 07/02/2018  . OSA (obstructive sleep apnea) 07/02/2018  . Acute on chronic diastolic (congestive) heart failure ([place]) 07/02/2018  . Cellulitis of right lower extremity 07/02/2018  . Anemia 07/02/2018  . Type 2 diabetes mellitus with hyperglycemia, with long-term current use of insulin ([place]) 07/02/2018  . Chest pain 05/04/2018  .  Hypertensive urgency 05/03/2018  . Left facial numbness 05/03/2018  . Headache 05/03/2018  . Elevated troponin 05/03/2018  . Hyperlipidemia associated with type 2 diabetes mellitus ([place]) 03/08/2018  . Hypertension associated with stage 3 chronic kidney disease due to type 2 diabetes mellitus ([place]) 03/08/2018  . Vitamin D deficiency 12/21/2017  . Generalized anxiety disorder 08/07/2017  . Diabetic peripheral neuropathy ([place]) 06/21/2017  . History of asthma 06/21/2017  . Steatosis of liver 06/21/2017  . Microalbuminuric diabetic nephropathy ([place]) 06/01/2017  . Raised TSH level 06/01/2017  . Thrombocytosis 06/01/2017  . Morbid obesity with BMI of 40.0-44.9, adult ([place]) 05/30/2017  . Allergic rhinitis 05/30/2017  . PAF (paroxysmal atrial fibrillation) ([place]) 05/25/2017  . Sepsis ([place]) 05/03/2017    Class: Present on Admission  . Acute renal failure superimposed on stage 3 chronic kidney disease ([place]) 05/03/2017    Class: Stage 3  . Neck pain   . Acidosis, metabolic   . Hyponatremia   . Type II diabetes mellitus with renal manifestations ([place]) 04/30/2017  . Normocytic anemia 04/30/2017  . Staphylococcus aureus bacteremia 03/15/2016  . Streptococcal bacteremia 03/15/2016  . Tachycardia 03/14/2016  . Fever 03/14/2016  . Bad headache 03/14/2016  . Morbid obesity ([place])   . Diabetes ([place]) 01/01/2014    Past Surgical History:  Procedure Laterality Date  . BARIATRIC SURGERY    . BREAST REDUCTION SURGERY  03/21/2017  . CARDIAC CATHETERIZATION  01/06/2016  . CARDIAC  CATHETERIZATION N/A 01/06/2016   Procedure: Left Heart Cath and Coronary Angiography;  Surgeon: Wellington Hampshire, MD;  Location: [facility];  Service: Cardiovascular;  Laterality: N/A;  . CESAREAN SECTION  08/2014     OB History    Gravida  1   Para      Term      Preterm      AB      Living        SAB      IAB      Ectopic      Multiple      Live Births              Family History  Problem  Relation Age of Onset  . Diabetes Mother   . Hypertension Mother   . Thyroid disease Mother   . Kidney disease Maternal Grandmother   . Diabetes Maternal Grandmother   . Heart attack Other     Social History   Tobacco Use  . Smoking status: Never Smoker  . Smokeless tobacco: Never Used  Vaping Use  . Vaping Use: Never used  Substance Use Topics  . Alcohol use: No  . Drug use: No    Home Medications Prior to Admission medications   Medication Sig Start Date End Date Taking? Authorizing Provider  amLODipine (NORVASC) 10 MG tablet Take 1 tablet (10 mg total) by mouth daily. 07/08/19   Nita Sells, MD  atorvastatin (LIPITOR) 10 MG tablet Take 10 mg by mouth daily.  03/08/18   [provider]  butalbital-acetaminophen-caffeine (FIORICET) 50-325-40 MG tablet Take 1 tablet by mouth every 4 (four) hours as needed for headache. 12/03/19   Caren Griffins, MD  carvedilol (COREG) 25 MG tablet Take 1 tablet (25 mg total) by mouth 2 (two) times daily with a meal. 05/06/18   Lady Deutscher, MD  diclofenac Sodium (VOLTAREN) 1 % GEL Apply 2 g topically 4 (four) times daily as needed (pain).     [provider]  diphenhydrAMINE (BENADRYL) 25 mg capsule Take 1 capsule (25 mg total) by mouth every 6 (six) hours as needed (migraine headache). Patient not taking: Reported on 01/28/2020 12/03/19   Caren Griffins, MD  furosemide (LASIX) 40 MG tablet Take 40 mg by mouth daily. 06/23/19   [provider]  gabapentin (NEURONTIN) 100 MG capsule Take 100 mg by mouth 3 (three) times daily.     [provider]  hydrALAZINE (APRESOLINE) 50 MG tablet Take 1 tablet (50 mg total) by mouth 3 (three) times daily. 07/07/19   Nita Sells, MD  prochlorperazine (COMPAZINE) 10 MG tablet Take 1 tablet (10 mg total) by mouth every 6 (six) hours as needed (migraine headace). 12/03/19   Caren Griffins, MD  sertraline (ZOLOFT) 100 MG tablet Take 100 mg by mouth at  bedtime. 04/30/19   [provider]  Vitamin D, Ergocalciferol, (DRISDOL) 1.25 MG (50000 UNIT) CAPS capsule Take 50,000 Units by mouth every Thursday. 12/23/19   [provider]    Allergies    Patient has no known allergies.  Review of Systems   Review of Systems  Constitutional: Positive for chills, fatigue and fever. Negative for appetite change, diaphoresis and unexpected weight change.  HENT: Negative for mouth sores.   Eyes: Negative for visual disturbance.  Respiratory: Positive for cough and shortness of breath. Negative for chest tightness and wheezing.   Cardiovascular: Positive for chest pain.  Gastrointestinal: Positive for nausea. Negative for  abdominal pain, constipation, diarrhea and vomiting.  Endocrine: Negative for polydipsia, polyphagia and polyuria.  Genitourinary: Negative for dysuria, frequency, hematuria and urgency.  Musculoskeletal: Positive for myalgias and neck pain. Negative for back pain and neck stiffness.  Skin: Negative for rash.  Allergic/Immunologic: Negative for immunocompromised state.  Neurological: Positive for headaches. Negative for syncope and light-headedness.  Hematological: Does not bruise/bleed easily.  Psychiatric/Behavioral: Negative for sleep disturbance. The patient is not nervous/anxious.     Physical Exam Updated Vital Signs BP (!) 159/81 (BP Location: Right Arm)   Pulse (!) 115   Temp (!) 101.8 F (38.8 C)   Resp (!) 24   Ht '5\' 4"'$  (1.626 m)   Wt 92.5 kg   SpO2 99%   BMI 35.02 kg/m   Physical Exam Vitals and nursing note reviewed.  Constitutional:      General: She is not in acute distress.    Appearance: She is ill-appearing. She is not diaphoretic.  HENT:     Head: Normocephalic.  Eyes:     General: No scleral icterus.    Conjunctiva/sclera: Conjunctivae normal.  Cardiovascular:     Rate and Rhythm: Regular rhythm. Tachycardia present.     Pulses: Normal pulses.          Radial pulses are 2+ on the  right side and 2+ on the left side.  Pulmonary:     Effort: Tachypnea present. No accessory muscle usage, prolonged expiration, respiratory distress or retractions.     Breath sounds: No stridor.     Comments: Equal chest rise. No increased work of breathing. Abdominal:     General: There is no distension.     Palpations: Abdomen is soft.     Tenderness: There is abdominal tenderness ( Generalized, mild). There is no guarding or rebound.  Musculoskeletal:     Cervical back: Normal range of motion. No rigidity. No spinous process tenderness or muscular tenderness.     Right lower leg: No tenderness. No edema.     Left lower leg: No tenderness. No edema.     Comments: Moves all extremities equally and without difficulty.  Skin:    General: Skin is warm and dry.     Capillary Refill: Capillary refill takes less than 2 seconds.  Neurological:     Mental Status: She is alert.     GCS: GCS eye subscore is 4. GCS verbal subscore is 5. GCS motor subscore is 6.     Comments: Speech is clear and goal oriented.  Psychiatric:        Mood and Affect: Mood normal.     ED Results / Procedures / Treatments   Labs (all labs ordered are listed, but only abnormal results are displayed) Labs Reviewed  COMPREHENSIVE METABOLIC PANEL - Abnormal; Notable for the following components:      Result Value   CO2 17 (*)    Glucose, Bld 125 (*)    BUN 34 (*)    Creatinine, Ser 3.14 (*)    Calcium 8.3 (*)    Total Protein 6.0 (*)    Albumin 2.5 (*)    GFR, Estimated 19 (*)    All other components within normal limits  CBC WITH DIFFERENTIAL/PLATELET - Abnormal; Notable for the following components:   RBC 3.22 (*)    Hemoglobin 9.5 (*)    HCT 29.6 (*)    All other components within normal limits  CULTURE, BLOOD (ROUTINE X 2)  CULTURE, BLOOD (ROUTINE X 2)  SARS CORONAVIRUS  2 (TAT 6-24 HRS)  URINE CULTURE  LACTIC ACID, PLASMA  PROTIME-INR  LACTIC ACID, PLASMA  URINALYSIS, ROUTINE W REFLEX  MICROSCOPIC  APTT  D-DIMER, QUANTITATIVE (NOT AT [facility])  I-STAT BETA HCG BLOOD, ED ([facility], [facility], [facility] ONLY)    EKG ED ECG REPORT   Date: 08/17/2020  Rate: 119  Rhythm: sinus tachycardia  QRS Axis: right  Intervals: normal  ST/T Wave abnormalities: normal  Conduction Disutrbances:none  Narrative Interpretation:   Old EKG Reviewed: unchanged  I have personally reviewed the EKG tracing and agree with the computerized printout as noted.  Radiology DG Chest 2 View  Result Date: 08/17/2020 CLINICAL DATA:  Suspected sepsis, chest pain, shortness of breath. EXAM: CHEST - 2 VIEW COMPARISON:  Chest x-ray 07/05/2019 FINDINGS: The heart size and mediastinal contours are within normal limits. No focal consolidation. No pulmonary edema. No pleural effusion. No pneumothorax. No acute osseous abnormality. Nonspecific air-fluid levels within the upper abdomen. IMPRESSION: 1. No active cardiopulmonary disease. 2.  Nonspecific air-fluid levels within the upper abdomen. Electronically Signed   By: Iven Finn M.D.   On: 08/17/2020 05:17    Procedures .Critical Care Performed by: Abigail Butts, PA-C Authorized by: Abigail Butts, PA-C   Critical care provider statement:    Critical care time (minutes):  45   Critical care time was exclusive of:  Separately billable procedures and treating other patients and teaching time   Critical care was necessary to treat or prevent imminent or life-threatening deterioration of the following conditions:  Sepsis   Critical care was time spent personally by me on the following activities:  Discussions with consultants, evaluation of patient's response to treatment, examination of patient, ordering and performing treatments and interventions, ordering and review of laboratory studies, ordering and review of radiographic studies, pulse oximetry, re-evaluation of patient's condition, obtaining history from patient or surrogate and review of old charts   I  assumed direction of critical care for this patient from another provider in my specialty: no     Care discussed with: admitting provider       Medications Ordered in ED Medications  lactated ringers infusion (has no administration in time range)  ceFEPIme (MAXIPIME) 2 g in sodium chloride 0.9 % 100 mL IVPB (has no administration in time range)  metroNIDAZOLE (FLAGYL) IVPB 500 mg (has no administration in time range)  lactated ringers bolus 1,000 mL (1,000 mLs Intravenous New Bag/Given 08/17/20 0609)    And  lactated ringers bolus 800 mL (has no administration in time range)  fentaNYL (SUBLIMAZE) injection 50 mcg (has no administration in time range)  vancomycin (VANCOREADY) IVPB 1500 mg/300 mL (1,500 mg Intravenous New Bag/Given 08/17/20 0612)  acetaminophen (TYLENOL) tablet 650 mg (650 mg Oral Given 08/17/20 [id number])    ED Course  I have reviewed the triage vital signs and the nursing notes.  Pertinent labs & imaging results that were available during my care of the patient were reviewed by me and considered in my medical decision making (see chart for details).  Clinical Course as of 08/17/20 [id number]  Mon Aug 17, 2020  0615 Creatinine(!): 3.14 AKI - some above baseline [HM]  0616 Hemoglobin(!): 9.5 baseline [HM]    Clinical Course User Index [HM] Daiana Vitiello, Gwenlyn Perking   MDM Rules/Calculators/A&P                           Patient presents with chest pain, shortness of breath, cough, fever, headache and body  aches.  No known Covid contacts however Covid-like illness.  Patient is septic with tachycardia, tachypnea and fever.  Fever control given.   Considering sepsis, COVID-19, pulmonary embolism  Additional history obtained:  Previous records obtained and reviewed.    Lab Tests:  I Ordered, reviewed, and interpreted labs, which included:  Sepsis work-up CBC with mild anemia which appears to be baseline for patient CMP shows AKI with creatinine of 3.14.  This is some  above patient's baseline of 2.25 fluids are being given Other lab work is pending at this time.  ECG:  I personally viewed and interpreted the ECG obtained.  Sinus tachycardia.  No acute ischemia.   Imaging Studies ordered:  I have placed order for imaging including chest x-ray. I personally reviewed and interpreted imaging.  No evidence of consolidation, pulmonary edema or groundglass opacities.   ED Course:  Presents emergency department with Covid-like illness.  Septic on arrival.  Sepsis order set utilized.  Fluids, antibiotics, antipyretics given.  She has worsening acute on chronic kidney disease with elevated serum creatinine.  Hypertensive on arrival.  Patient does complain of neck pain however has full range of motion of her neck.  Less likely to be meningitis.  Patient with chest pain, shortness of breath and tachycardia.  Concern for pulmonary embolism.  Given elevated serum creatinine, unable to obtain CT angiogram.  D-dimer pending.  Patient will be admitted for continued sepsis management.  Patient voiced understanding and agreement wit the current medical evaluation and treatment plan.  Questions were answered to expressed satisfaction.     Portions of this note were generated with Lobbyist. Dictation errors may occur despite best attempts at proofreading.  At shift change care was transferred to [facility], PA-C who will follow pending studies, re-evaulate and admit.   Final Clinical Impression(s) / ED Diagnoses Final diagnoses:  Sepsis with acute renal failure without septic shock, due to unspecified organism, unspecified acute renal failure type [facility])    Rx / DC Orders ED Discharge Orders    None       Tami Barren, Gwenlyn Perking 08/17/20 [id number]    Maudie Flakes, MD 08/17/20 [phone]

## 2020-08-17 NOTE — ED Notes (Signed)
Multiple attempts made for second large bore IV without success.

## 2020-08-17 NOTE — ED Notes (Addendum)
Pt returned from [facility].

## 2020-08-17 NOTE — ED Notes (Signed)
Food tray delivered

## 2020-08-17 NOTE — Progress Notes (Signed)
Pharmacy Antibiotic Note  Jacqueline [name] is a 40 y.o. female admitted on 08/17/2020 with infection of unknown source.  Pharmacy has been consulted for vancomycin/cefepime dosing. SCr 3.14 on presentation (last 2.25 in 11/2019).  Plan: Cefepime 2g IV x 1; then 2g IV q24h Vancomycin '1500mg'$  IV x1; then 1g IV q48h. Goal AUC 400-550. Expected AUC: 504 SCr used: 3.14 Flagyl '500mg'$  IV x 1 per EDP - f/u if to continue Monitor clinical progress, c/s, renal function F/u de-escalation plan/LOT, vancomycin levels as indicated   Height: '5\' 4"'$  (162.6 cm) Weight: 92.5 kg (204 lb) IBW/kg (Calculated) : 54.7  Temp (24hrs), Avg:101.4 F (38.6 C), Min:101 F (38.3 C), Max:101.8 F (38.8 C)  Recent Labs  Lab 08/17/20 0454  WBC 9.8  CREATININE 3.14*  LATICACIDVEN 0.9    Estimated Creatinine Clearance: 26.2 mL/min (A) (by C-G formula based on SCr of 3.14 mg/dL (H)).    No Known Allergies  Antimicrobials this admission: 1/31 vancomycin >>  1/31 cefepime >>  1/31 flagyl x 1  Dose adjustments this admission:   Microbiology results:   Arturo Morton, PharmD, BCPS Please check AMION for all [facility] contact numbers Clinical Pharmacist 08/17/2020 8:18 AM

## 2020-08-17 NOTE — H&P (Addendum)
History and Physical    Jacqueline [name] [id number] DOB: Mar 03, 1981 DOA: 08/17/2020  Referring MD/NP/PA:Kelly Deborah Chalk, PA-C PCP: Audley Hose, MD  Consultants: [facility]. [facility] Patient coming from: Home via EMS  Chief Complaint: Fever and chest pain  I have personally briefly reviewed patient's old medical records in [place]   HPI: Jacqueline [name] is a 40 y.o. female with medical history significant of hypertension, diabetes mellitus type 2, anemia, and obesity presents with complaints of fever and chest pain.  Patient had driven to [place] for funeral last week before onset of symptoms.  She develop fever with headache, generalized muscle aches, nausea, and diarrhea 2 days ago.  Fever was reported up to 103F with reports of chest pain this morning that woke her out of her sleep.  Initially vaccinated  against COVID-19 back in March of last year, but had not received the booster yet.  She has not had any significant cough, change in taste, vomiting, or leg pain.  Patient also complained of having some shortness of breath which is new.  ED Course: On admission to the emergency department patient was seen to be febrile up to 101.8 F, pulse 110-1 22, respiration 18-24, blood pressure 159/81-192/88, and O2 saturation maintained on room air.  Labs significant for hemoglobin 9.5, CO2 17, BUN 34, creatinine 3.14, D-dimer 3.89, and lactic acid 0.9.  Urinalysis was positive for nitrites and many bacteria.  Chest x-ray showed air-fluid levels in the upper abdomen but was otherwise clear.  VQ scan was ordered due to elevated D-dimer, but noted low risk of sepsis protocol has been initiated with full fluid bolus, metronidazole, vancomycin, and cefepime as source was not initially clear.  COVID-19 screening was pending.  Review of Systems  Constitutional: Positive for fever and malaise/fatigue.  HENT: Negative for congestion and nosebleeds.   Eyes: Negative for  photophobia and pain.  Respiratory: Positive for shortness of breath. Negative for cough.   Cardiovascular: Positive for leg swelling. Negative for chest pain.  Gastrointestinal: Positive for diarrhea and nausea. Negative for abdominal pain and vomiting.  Genitourinary: Negative for dysuria and hematuria.  Musculoskeletal: Positive for myalgias. Negative for falls.  Skin: Negative for rash.  Neurological: Positive for weakness. Negative for focal weakness and loss of consciousness.  Psychiatric/Behavioral: Negative for memory loss and substance abuse.    Past Medical History:  Diagnosis Date  . Anemia   . Anxiety   . Chronic bronchitis ([place])   . Hypertension   . Increased frequency of headaches   . Morbid obesity ([place])   . Sleep apnea   . Type II diabetes mellitus ([place])     Past Surgical History:  Procedure Laterality Date  . BARIATRIC SURGERY    . BREAST REDUCTION SURGERY  03/21/2017  . CARDIAC CATHETERIZATION  01/06/2016  . CARDIAC CATHETERIZATION N/A 01/06/2016   Procedure: Left Heart Cath and Coronary Angiography;  Surgeon: Wellington Hampshire, MD;  Location: [facility];  Service: Cardiovascular;  Laterality: N/A;  . CESAREAN SECTION  08/2014     reports that she has never smoked. She has never used smokeless tobacco. She reports that she does not drink alcohol and does not use drugs.  No Known Allergies  Family History  Problem Relation Age of Onset  . Diabetes Mother   . Hypertension Mother   . Thyroid disease Mother   . Kidney disease Maternal Grandmother   . Diabetes Maternal Grandmother   . Heart attack Other  Prior to Admission medications   Medication Sig Start Date End Date Taking? Authorizing Provider  amLODipine (NORVASC) 10 MG tablet Take 1 tablet (10 mg total) by mouth daily. 07/08/19   Nita Sells, MD  atorvastatin (LIPITOR) 10 MG tablet Take 10 mg by mouth daily.  03/08/18   [provider]  butalbital-acetaminophen-caffeine  (FIORICET) 50-325-40 MG tablet Take 1 tablet by mouth every 4 (four) hours as needed for headache. 12/03/19   Caren Griffins, MD  carvedilol (COREG) 25 MG tablet Take 1 tablet (25 mg total) by mouth 2 (two) times daily with a meal. 05/06/18   Lady Deutscher, MD  diclofenac Sodium (VOLTAREN) 1 % GEL Apply 2 g topically 4 (four) times daily as needed (pain).     [provider]  diphenhydrAMINE (BENADRYL) 25 mg capsule Take 1 capsule (25 mg total) by mouth every 6 (six) hours as needed (migraine headache). Patient not taking: Reported on 01/28/2020 12/03/19   Caren Griffins, MD  furosemide (LASIX) 40 MG tablet Take 40 mg by mouth daily. 06/23/19   [provider]  gabapentin (NEURONTIN) 100 MG capsule Take 100 mg by mouth 3 (three) times daily.     [provider]  hydrALAZINE (APRESOLINE) 50 MG tablet Take 1 tablet (50 mg total) by mouth 3 (three) times daily. 07/07/19   Nita Sells, MD  prochlorperazine (COMPAZINE) 10 MG tablet Take 1 tablet (10 mg total) by mouth every 6 (six) hours as needed (migraine headace). 12/03/19   Caren Griffins, MD  sertraline (ZOLOFT) 100 MG tablet Take 100 mg by mouth at bedtime. 04/30/19   [provider]  Vitamin D, Ergocalciferol, (DRISDOL) 1.25 MG (50000 UNIT) CAPS capsule Take 50,000 Units by mouth every Thursday. 12/23/19   [provider]    Physical Exam:  Constitutional: Middle-age female who appears to be ill Vitals:   08/17/20 0547 08/17/20 0700 08/17/20 0855 08/17/20 0915  BP: (!) 159/81 (!) 159/80 (!) 162/95 (!) 163/88  Pulse: (!) 115 100 97 98  Resp: (!) 24 (!) '21 17 13  '$ Temp: (!) 101.8 F (38.8 C)  100.3 F (37.9 C)   TempSrc:   Oral   SpO2: 99% 99% 100% 100%  Weight:      Height:       Eyes: PERRL, lids and conjunctivae normal ENMT: Mucous membranes are dry. Posterior pharynx clear of any exudate or lesions.  Neck: normal, supple, no masses, no thyromegaly Respiratory: clear to  auscultation bilaterally, no wheezing, no crackles. Normal respiratory effort. No accessory muscle use.  Cardiovascular: Regular rate and rhythm, no murmurs / rubs / gallops.  Trace left lower extremity edema. 2+ pedal pulses. No carotid bruits.  Abdomen: no tenderness, no masses palpated. No hepatosplenomegaly. Bowel sounds positive.  Musculoskeletal: no clubbing / cyanosis.  Tenderness to palpation of the distal left calf.  Skin: no rashes, lesions, ulcers. No induration Neurologic: CN 2-12 grossly intact. Sensation intact, DTR normal. Strength 5/5 in all 4.  Psychiatric: Normal judgment and insight. Alert and oriented x 3. Normal mood.     Labs on Admission: I have personally reviewed following labs and imaging studies  CBC: Recent Labs  Lab 08/17/20 0454  WBC 9.8  NEUTROABS 7.5  HGB 9.5*  HCT 29.6*  MCV 91.9  PLT [id number]   Basic Metabolic Panel: Recent Labs  Lab 08/17/20 0454  NA 139  K 3.6  CL 111  CO2 17*  GLUCOSE 125*  BUN 34*  CREATININE 3.14*  CALCIUM 8.3*   GFR: Estimated Creatinine Clearance: 26.2 mL/min (A) (by C-G formula based on SCr of 3.14 mg/dL (H)). Liver Function Tests: Recent Labs  Lab 08/17/20 0454  AST 24  ALT 27  ALKPHOS 80  BILITOT 1.0  PROT 6.0*  ALBUMIN 2.5*   No results for input(s): LIPASE, AMYLASE in the last 168 hours. No results for input(s): AMMONIA in the last 168 hours. Coagulation Profile: Recent Labs  Lab 08/17/20 0454  INR 1.1   Cardiac Enzymes: No results for input(s): CKTOTAL, CKMB, CKMBINDEX, TROPONINI in the last 168 hours. BNP (last 3 results) No results for input(s): PROBNP in the last 8760 hours. HbA1C: No results for input(s): HGBA1C in the last 72 hours. CBG: No results for input(s): GLUCAP in the last 168 hours. Lipid Profile: No results for input(s): CHOL, HDL, LDLCALC, TRIG, CHOLHDL, LDLDIRECT in the last 72 hours. Thyroid Function Tests: No results for input(s): TSH, T4TOTAL, FREET4, T3FREE, THYROIDAB in  the last 72 hours. Anemia Panel: No results for input(s): VITAMINB12, FOLATE, FERRITIN, TIBC, IRON, RETICCTPCT in the last 72 hours. Urine analysis:    Component Value Date/Time   COLORURINE YELLOW 08/17/2020 0634   APPEARANCEUR CLOUDY (A) 08/17/2020 0634   LABSPEC 1.020 08/17/2020 0634   PHURINE 6.0 08/17/2020 0634   GLUCOSEU NEGATIVE 08/17/2020 0634   HGBUR SMALL (A) 08/17/2020 0634   BILIRUBINUR NEGATIVE 08/17/2020 0634   KETONESUR NEGATIVE 08/17/2020 0634   PROTEINUR >300 (A) 08/17/2020 0634   UROBILINOGEN 0.2 08/21/2012 2130   NITRITE POSITIVE (A) 08/17/2020 0634   LEUKOCYTESUR NEGATIVE 08/17/2020 0634   Sepsis Labs: No results found for this or any previous visit (from the past 240 hour(s)).   Radiological Exams on Admission: DG Chest 2 View  Result Date: 08/17/2020 CLINICAL DATA:  Suspected sepsis, chest pain, shortness of breath. EXAM: CHEST - 2 VIEW COMPARISON:  Chest x-ray 07/05/2019 FINDINGS: The heart size and mediastinal contours are within normal limits. No focal consolidation. No pulmonary edema. No pleural effusion. No pneumothorax. No acute osseous abnormality. Nonspecific air-fluid levels within the upper abdomen. IMPRESSION: 1. No active cardiopulmonary disease. 2.  Nonspecific air-fluid levels within the upper abdomen. Electronically Signed   By: Iven Finn M.D.   On: 08/17/2020 05:17   NM Pulmonary Perfusion  Result Date: 08/17/2020 CLINICAL DATA:  Dyspnea, chest pain EXAM: NUCLEAR MEDICINE PERFUSION LUNG SCAN TECHNIQUE: Perfusion images were obtained in multiple projections after intravenous injection of radiopharmaceutical. Ventilation scans intentionally deferred if perfusion scan and chest x-ray adequate for interpretation during COVID 19 epidemic. RADIOPHARMACEUTICALS:  4.3 mCi Tc-33mMAA IV COMPARISON:  07/05/2019 FINDINGS: AP, PA, bilateral anterior and posterior oblique, and bilateral true lateral views are presented. No perfusion abnormalities are  identified. Normal distribution of radiotracer bilaterally. IMPRESSION: Normal examination.  Very low likelihood of pulmonary embolism. Electronically Signed   By: AFidela SalisburyMD   On: 08/17/2020 08:42    EKG: Independently reviewed.  Sinus tachycardia 119 bpm  Assessment/Plan Sepsis possibly secondary to urinary tract infection: Acute.  Patient presented with fever up to 101.11F with tachycardia and tachypnea.  Lactic acid was reassuring 0.9.  Urinalysis significant for bacteria and positive nitrites.  Suspect UTI is likely secondary and causes more likely Covid-19 given flulike symptoms.  Patient had initially been given empiric antibiotics of vancomycin, cefepime, and metronidazole.  Patient previously had Klebsiella urinary tract infection back in March of last year that was was only resistant to ampicillin. -Admit to a medical telemetry bed -Follow-up blood and  urine cultures -De-escalate antibiotics to Rocephin IV -Tylenol as needed fever  Acute renal failure superimposed on chronic kidney disease stage III/IV: Patient presents with creatinine elevated up to 3.14 with BUN 34.  Patient baseline creatinine previously noted to be 2.1-2.2.  Greater than 0.7 rising creatinine from previous to suggest acute renal failure.  Patient followed in the outpatient setting by Dr. Carolin Sicks of [facility]. -Avoid nephrotoxic agents -Continue IV fluids at 75 mL per -Continue to monitor kidney function  Viral illness: Acute.  COVID-19 screening was still pending.  Patient presents with fever, diarrhea, and some shortness of breath.  Chest x-ray was clear of any lung issue and noted air-fluid levels in the abdomen which can likely be related to patient's diarrhea.  Patient had received her initial Covid vaccines, but had not received her booster and she had recently attended a funeral. -Follow-up COVID-19 screen -Airborne precautions -Check inflammatory markers -Albuterol inhaler if needed   -Vitamin C and zinc  Elevated D-dimer and leg pain: Acute.  On physical exam patient noted tenderness to palpation of the distal left leg.  She had recently traveled to to [place] for funeral.  D-dimer 3.89 on admission.  VQ scan was low risk for signs of a pulmonary embolus. -Check vascular ultrasound of the lower extremities  Chest pain: Acute.  EKG did not show any significant signs of ischemia.  -Check high-sensitivity troponin -Continue to monitor and may warrant further work-up   Hypertensive urgency: Acute.  Patient presents with blood pressure elevated of 192/88 on admission.  Medications include Coreg 25 mg twice daily, amlodipine 10 mg daily, furosemide 40 mg as needed for edema and hydralazine 50 mg 3 times daily. -Continue Coreg, amlodipine, and hydralazine  Anemia of chronic disease: On admission hemoglobin 9.5 which appears around patient's baseline. -Continue to monitor  Diabetes mellitus type 2 diet controlled: Last hemoglobin A1c was noted to 6.5 on 5/17.  On admission glucose is mildly elevated at 125. -Continue to monitor  Anxiety -Continue Zoloft 100 mg nightly  Obesity: BMI 35.02 kg/m  Hypoalbuminemia: Acute.  Albumin 2.5 on admission. -Check prealbumin in a.m.  DVT prophylaxis: Heparin Code Status: Full Family Communication: Patient declined when asked who she would like me to update Disposition Plan: Hopefully discharge home Consults called: None Admission status: Inpatient, require more than 2 midnight stay due to acute renal failure  Norval Morton MD [facility]   If 7PM-7AM, please contact night-coverage   08/17/2020, 9:46 AM

## 2020-08-17 NOTE — Progress Notes (Signed)
Code Sepsis initiated @ [id number] AM, [place] following.

## 2020-08-17 NOTE — ED Provider Notes (Signed)
9:46 AM 40 year old female to be admitted for management of SIRS/sepsis in the setting of acute kidney injury.  She has bacteriuria and positive nitrites with history of prior Klebsiella UTI in 2020 with similar urinalysis results.  Was covered with broad-spectrum antibiotics on arrival.  Given IV fluids.  Also with complaints of flulike illness with concern for COVID positivity.  COVID test is presently pending.  Her fever is responding appropriately to antipyretics.  Dr. Tamala Julian of [facility] to assess in the ED for admission.   Clinical Course as of 08/17/20 0957  Mon Aug 17, 2020  0615 Creatinine(!): 3.14 AKI - some above baseline [HM]  0616 Hemoglobin(!): 9.5 baseline [HM]  0802 UA with bacteriuria, positive nitrites; possible UTI. Culture pending. Seems patient had similar UA results in 06/2019 which grew out a Klebsiella UTI, sensitive to cephalosporins at that time.  [id number] Patient at VQ scan. [KH]  [phone] Requesting medication for headache. Fioricet ordered. [KH]  [id number] Case discussed with Dr. Tamala Julian of [facility] who will admit. [KH]    Clinical Course User Index [HM] Muthersbaugh, Gwenlyn Perking [KH] Antonietta Breach, PA-C   Results for orders placed or performed during the hospital encounter of 08/17/20  Comprehensive metabolic panel  Result Value Ref Range   Sodium 139 135 - 145 mmol/L   Potassium 3.6 3.5 - 5.1 mmol/L   Chloride 111 98 - 111 mmol/L   CO2 17 (L) 22 - 32 mmol/L   Glucose, Bld 125 (H) 70 - 99 mg/dL   BUN 34 (H) 6 - 20 mg/dL   Creatinine, Ser 3.14 (H) 0.44 - 1.00 mg/dL   Calcium 8.3 (L) 8.9 - 10.3 mg/dL   Total Protein 6.0 (L) 6.5 - 8.1 g/dL   Albumin 2.5 (L) 3.5 - 5.0 g/dL   AST 24 15 - 41 U/L   ALT 27 0 - 44 U/L   Alkaline Phosphatase 80 38 - 126 U/L   Total Bilirubin 1.0 0.3 - 1.2 mg/dL   GFR, Estimated 19 (L) >60 mL/min   Anion gap 11 5 - 15  Lactic acid, plasma  Result Value Ref Range   Lactic Acid, Venous 0.9 0.5 - 1.9 mmol/L  CBC with  Differential  Result Value Ref Range   WBC 9.8 4.0 - 10.5 K/uL   RBC 3.22 (L) 3.87 - 5.11 MIL/uL   Hemoglobin 9.5 (L) 12.0 - 15.0 g/dL   HCT 29.6 (L) 36.0 - 46.0 %   MCV 91.9 80.0 - 100.0 fL   MCH 29.5 26.0 - 34.0 pg   MCHC 32.1 30.0 - 36.0 g/dL   RDW 13.1 11.5 - 15.5 %   Platelets 282 150 - 400 K/uL   nRBC 0.0 0.0 - 0.2 %   Neutrophils Relative % 77 %   Neutro Abs 7.5 1.7 - 7.7 K/uL   Lymphocytes Relative 14 %   Lymphs Abs 1.3 0.7 - 4.0 K/uL   Monocytes Relative 8 %   Monocytes Absolute 0.8 0.1 - 1.0 K/uL   Eosinophils Relative 1 %   Eosinophils Absolute 0.1 0.0 - 0.5 K/uL   Basophils Relative 0 %   Basophils Absolute 0.0 0.0 - 0.1 K/uL   Immature Granulocytes 0 %   Abs Immature Granulocytes 0.03 0.00 - 0.07 K/uL  Protime-INR  Result Value Ref Range   Prothrombin Time 13.3 11.4 - 15.2 seconds   INR 1.1 0.8 - 1.2  Urinalysis, Routine w reflex microscopic  Result Value Ref Range   Color, Urine  YELLOW YELLOW   APPearance CLOUDY (A) CLEAR   Specific Gravity, Urine 1.020 1.005 - 1.030   pH 6.0 5.0 - 8.0   Glucose, UA NEGATIVE NEGATIVE mg/dL   Hgb urine dipstick SMALL (A) NEGATIVE   Bilirubin Urine NEGATIVE NEGATIVE   Ketones, ur NEGATIVE NEGATIVE mg/dL   Protein, ur >300 (A) NEGATIVE mg/dL   Nitrite POSITIVE (A) NEGATIVE   Leukocytes,Ua NEGATIVE NEGATIVE  APTT  Result Value Ref Range   aPTT 25 24 - 36 seconds  D-dimer, quantitative (not at [facility])  Result Value Ref Range   D-Dimer, Quant 3.89 (H) 0.00 - 0.50 ug/mL-FEU  Urinalysis, Microscopic (reflex)  Result Value Ref Range   RBC / HPF 0-5 0 - 5 RBC/hpf   WBC, UA 0-5 0 - 5 WBC/hpf   Bacteria, UA MANY (A) NONE SEEN   Squamous Epithelial / LPF 0-5 0 - 5   Granular Casts, UA PRESENT    DG Chest 2 View  Result Date: 08/17/2020 CLINICAL DATA:  Suspected sepsis, chest pain, shortness of breath. EXAM: CHEST - 2 VIEW COMPARISON:  Chest x-ray 07/05/2019 FINDINGS: The heart size and mediastinal contours are within normal  limits. No focal consolidation. No pulmonary edema. No pleural effusion. No pneumothorax. No acute osseous abnormality. Nonspecific air-fluid levels within the upper abdomen. IMPRESSION: 1. No active cardiopulmonary disease. 2.  Nonspecific air-fluid levels within the upper abdomen. Electronically Signed   By: Iven Finn M.D.   On: 08/17/2020 05:17   NM Pulmonary Perfusion  Result Date: 08/17/2020 CLINICAL DATA:  Dyspnea, chest pain EXAM: NUCLEAR MEDICINE PERFUSION LUNG SCAN TECHNIQUE: Perfusion images were obtained in multiple projections after intravenous injection of radiopharmaceutical. Ventilation scans intentionally deferred if perfusion scan and chest x-ray adequate for interpretation during COVID 19 epidemic. RADIOPHARMACEUTICALS:  4.3 mCi Tc-95mMAA IV COMPARISON:  07/05/2019 FINDINGS: AP, PA, bilateral anterior and posterior oblique, and bilateral true lateral views are presented. No perfusion abnormalities are identified. Normal distribution of radiotracer bilaterally. IMPRESSION: Normal examination.  Very low likelihood of pulmonary embolism. Electronically Signed   By: AFidela SalisburyMD   On: 08/17/2020 08:42     HAntonietta Breach PA-C 08/17/20 [id number]   STruddie Hidden MD 08/17/20 1051

## 2020-08-18 ENCOUNTER — Encounter (HOSPITAL_COMMUNITY): Payer: Self-pay | Admitting: Internal Medicine

## 2020-08-18 DIAGNOSIS — I160 Hypertensive urgency: Secondary | ICD-10-CM

## 2020-08-18 DIAGNOSIS — A419 Sepsis, unspecified organism: Principal | ICD-10-CM

## 2020-08-18 DIAGNOSIS — N390 Urinary tract infection, site not specified: Secondary | ICD-10-CM

## 2020-08-18 LAB — CBC WITH DIFFERENTIAL/PLATELET
Abs Immature Granulocytes: 0.06 10*3/uL (ref 0.00–0.07)
Basophils Absolute: 0 10*3/uL (ref 0.0–0.1)
Basophils Relative: 0 %
Eosinophils Absolute: 0.2 10*3/uL (ref 0.0–0.5)
Eosinophils Relative: 3 %
HCT: 27.5 % — ABNORMAL LOW (ref 36.0–46.0)
Hemoglobin: 8.6 g/dL — ABNORMAL LOW (ref 12.0–15.0)
Immature Granulocytes: 1 %
Lymphocytes Relative: 24 %
Lymphs Abs: 1.6 10*3/uL (ref 0.7–4.0)
MCH: 29.6 pg (ref 26.0–34.0)
MCHC: 31.3 g/dL (ref 30.0–36.0)
MCV: 94.5 fL (ref 80.0–100.0)
Monocytes Absolute: 1 10*3/uL (ref 0.1–1.0)
Monocytes Relative: 14 %
Neutro Abs: 3.8 10*3/uL (ref 1.7–7.7)
Neutrophils Relative %: 58 %
Platelets: 260 10*3/uL (ref 150–400)
RBC: 2.91 MIL/uL — ABNORMAL LOW (ref 3.87–5.11)
RDW: 13.1 % (ref 11.5–15.5)
WBC: 6.7 10*3/uL (ref 4.0–10.5)
nRBC: 0 % (ref 0.0–0.2)

## 2020-08-18 LAB — COMPREHENSIVE METABOLIC PANEL
ALT: 20 U/L (ref 0–44)
AST: 19 U/L (ref 15–41)
Albumin: 2.1 g/dL — ABNORMAL LOW (ref 3.5–5.0)
Alkaline Phosphatase: 69 U/L (ref 38–126)
Anion gap: 10 (ref 5–15)
BUN: 29 mg/dL — ABNORMAL HIGH (ref 6–20)
CO2: 15 mmol/L — ABNORMAL LOW (ref 22–32)
Calcium: 8 mg/dL — ABNORMAL LOW (ref 8.9–10.3)
Chloride: 116 mmol/L — ABNORMAL HIGH (ref 98–111)
Creatinine, Ser: 2.97 mg/dL — ABNORMAL HIGH (ref 0.44–1.00)
GFR, Estimated: 20 mL/min — ABNORMAL LOW (ref >60)
Glucose, Bld: 136 mg/dL — ABNORMAL HIGH (ref 70–99)
Potassium: 3.8 mmol/L (ref 3.5–5.1)
Sodium: 141 mmol/L (ref 135–145)
Total Bilirubin: 1.2 mg/dL (ref 0.3–1.2)
Total Protein: 5.5 g/dL — ABNORMAL LOW (ref 6.5–8.1)

## 2020-08-18 LAB — URINE CULTURE

## 2020-08-18 LAB — PREALBUMIN: Prealbumin: 15.4 mg/dL — ABNORMAL LOW (ref 18–38)

## 2020-08-18 MED ORDER — OXYCODONE-ACETAMINOPHEN 5-325 MG PO TABS
1.0000 | ORAL_TABLET | Freq: Once | ORAL | Status: AC
  Administered 2020-08-18: 1 via ORAL
  Filled 2020-08-18: qty 1

## 2020-08-18 NOTE — Progress Notes (Signed)
PROGRESS NOTE    Jacqueline [name]  [id number] DOB: 1980-09-23 DOA: 08/17/2020 PCP: Audley Hose, MD    Brief Narrative:  40 year old female with history of hypertension, type 2 diabetes, anemia of chronic kidney disease, obesity presented to ER with complaints of fever and chest discomfort after recent travel from [place] 2 days ago.  She arrived home Saturday, started developing headache, generalized muscle ache, nausea and diarrhea.  She had a temperature 103 at home.  She had midsternal chest pain.  Denies any cough or sputum production.  Patient also had diarrhea. In the emergency room, temperature 101.8.  Chest x-ray was clear.  Urine was consistent with UTI.  COVID-19 was negative.  Patient was treated with IV fluids and started on Rocephin and her symptoms are stabilizing.   Assessment & Plan:   Principal Problem:   Sepsis ([place]) Active Problems:   Hypertensive urgency   Chest pain   Anemia of chronic disease   Viral illness   Elevated d-dimer   Acute lower UTI   Hypoalbuminemia  Sepsis, present on admission possibly secondary to UTI: COVID-19 negative.  Respiratory pathogen panel pending. Blood cultures negative so far. Urinalysis with multiple spaces, will reculture today. She was resuscitated with IV fluids and given antibiotics with vancomycin, cefepime and metronidazole.  Already clinically improving.  We will continue Rocephin until culture data available or clinical improvement. Hemodynamically stabilizing.  Acute renal failure superimposed on chronic kidney disease stage IVb: Reported baseline creatinine about 2.1-2.2.  Presented with creatinine of 3.14. Patient was treated with IV fluids and she is already stabilizing.  We will continue maintenance IV fluid today.  Recheck levels tomorrow to ensure stabilization.  Elevated D-dimer/recent long-haul travel: VQ scan low probability of pulmonary embolism.  Duplex is negative for DVT.  Chest pain: No evidence  of acute coronary syndrome.  Musculoskeletal chest pain.  Hypertensive urgency: Treated with home medications with Coreg, amlodipine and Lasix, hydralazine and already stabilizing.  Anemia of chronic kidney disease: Hemoglobin at about baseline.  Type 2 diabetes: Diet controlled at home.  Known A1c of 6.5.  Monitor.    DVT prophylaxis: heparin injection 5,000 Units Start: 08/17/20 1400   Code Status: Full code Family Communication: None.  Patient is communicating. Disposition Plan: Status is: Inpatient  Remains inpatient appropriate because:Inpatient level of care appropriate due to severity of illness   Dispo: The patient is from: Home              Anticipated d/c is to: Home              Anticipated d/c date is: 2 days              Patient currently is not medically stable to d/c.   Difficult to place patient No         Consultants:   none   Procedures:   None   Antimicrobials:  Antibiotics Given ([name] 72 hours)    Date/Time Action Medication Dose Rate   08/17/20 0612 New Bag/Given   vancomycin (VANCOREADY) IVPB 1500 mg/300 mL 1,500 mg 150 mL/hr   08/17/20 0629 New Bag/Given   ceFEPIme (MAXIPIME) 2 g in sodium chloride 0.9 % 100 mL IVPB 2 g 200 mL/hr   08/17/20 0634 New Bag/Given   metroNIDAZOLE (FLAGYL) IVPB 500 mg 500 mg 100 mL/hr   08/18/20 0531 New Bag/Given   cefTRIAXone (ROCEPHIN) 1 g in sodium chloride 0.9 % 100 mL IVPB 1 g 200 mL/hr  Subjective: Patient seen and examined.  She is still in the emergency room.  Overnight she had some headache and since then it has improved.  No fever overnight.  Remains on room air.  Chest pain is improved.  Denies any nausea vomiting.  Still has watery diarrhea.  Objective: Vitals:   08/18/20 1145 08/18/20 1200 08/18/20 1300 08/18/20 1342  BP:  120/72 (!) 120/58   Pulse: 84 84 92   Resp: 13  19   Temp:    98.8 F (37.1 C)  TempSrc:    Oral  SpO2: 99% 98% 99%   Weight:      Height:         Intake/Output Summary ([name] 24 hours) at 08/18/2020 1343 [name] data filed at 08/18/2020 0940 Gross per 24 hour  Intake 230 ml  Output -  Net 230 ml   Filed Weights   08/17/20 0438  Weight: 92.5 kg    Examination:  General exam: Appears calm and comfortable  Not in any distress. Respiratory system: Clear to auscultation. Respiratory effort normal.  No added sound. Cardiovascular system: S1 & S2 heard, RRR. No JVD, murmurs, rubs, gallops or clicks. No pedal edema.  No reproducible tenderness. Gastrointestinal system: Abdomen is nondistended, soft and nontender. No organomegaly or masses felt. Normal bowel sounds heard. Central nervous system: Alert and oriented. No focal neurological deficits. Extremities: Symmetric 5 x 5 power. Skin: No rashes, lesions or ulcers Psychiatry: Judgement and insight appear normal. Mood & affect appropriate.     Data Reviewed: I have personally reviewed following labs and imaging studies  CBC: Recent Labs  Lab 08/17/20 0454 08/18/20 0256  WBC 9.8 6.7  NEUTROABS 7.5 3.8  HGB 9.5* 8.6*  HCT 29.6* 27.5*  MCV 91.9 94.5  PLT 282 [id number]   Basic Metabolic Panel: Recent Labs  Lab 08/17/20 0454 08/18/20 0256  NA 139 141  K 3.6 3.8  CL 111 116*  CO2 17* 15*  GLUCOSE 125* 136*  BUN 34* 29*  CREATININE 3.14* 2.97*  CALCIUM 8.3* 8.0*   GFR: Estimated Creatinine Clearance: 27.7 mL/min (A) (by C-G formula based on SCr of 2.97 mg/dL (H)). Liver Function Tests: Recent Labs  Lab 08/17/20 0454 08/18/20 0256  AST 24 19  ALT 27 20  ALKPHOS 80 69  BILITOT 1.0 1.2  PROT 6.0* 5.5*  ALBUMIN 2.5* 2.1*   No results for input(s): LIPASE, AMYLASE in the [name] 168 hours. No results for input(s): AMMONIA in the [name] 168 hours. Coagulation Profile: Recent Labs  Lab 08/17/20 0454  INR 1.1   Cardiac Enzymes: No results for input(s): CKTOTAL, CKMB, CKMBINDEX, TROPONINI in the [name] 168 hours. BNP ([name] 3 results) No results for input(s): PROBNP in  the [name] 8760 hours. HbA1C: No results for input(s): HGBA1C in the [name] 72 hours. CBG: No results for input(s): GLUCAP in the [name] 168 hours. Lipid Profile: No results for input(s): CHOL, HDL, LDLCALC, TRIG, CHOLHDL, LDLDIRECT in the [name] 72 hours. Thyroid Function Tests: No results for input(s): TSH, T4TOTAL, FREET4, T3FREE, THYROIDAB in the [name] 72 hours. Anemia Panel: Recent Labs    08/17/20 1209  FERRITIN 98   Sepsis Labs: Recent Labs  Lab 08/17/20 0454 08/17/20 1209  PROCALCITON  --  0.31  LATICACIDVEN 0.9  --     Recent Results (from the past 240 hour(s))  Culture, blood (Routine x 2)     Status: None (Preliminary result)   Collection Time: 08/17/20  4:45 AM   Specimen: BLOOD  Result Value Ref Range Status   Specimen Description BLOOD RIGHT ARM  Final   Special Requests   Final    BOTTLES DRAWN AEROBIC AND ANAEROBIC Blood Culture results may not be optimal due to an inadequate volume of blood received in culture bottles   Culture   Final    NO GROWTH < 12 HOURS Performed at [facility] [address] [place], [place] [address]    Report Status PENDING  Incomplete  Culture, blood (Routine x 2)     Status: None (Preliminary result)   Collection Time: 08/17/20  4:53 AM   Specimen: BLOOD  Result Value Ref Range Status   Specimen Description BLOOD LEFT ARM  Final   Special Requests   Final    BOTTLES DRAWN AEROBIC AND ANAEROBIC Blood Culture results may not be optimal due to an inadequate volume of blood received in culture bottles   Culture   Final    NO GROWTH < 12 HOURS Performed at [facility], [place] [address] [place], [place] [address]    Report Status PENDING  Incomplete  Urine culture     Status: Abnormal   Collection Time: 08/17/20  5:29 AM   Specimen: In/Out Cath Urine  Result Value Ref Range Status   Specimen Description IN/OUT CATH URINE  Final   Special Requests   Final    NONE Performed at [facility], [place] [address]  [place], [place] [address]    Culture MULTIPLE SPECIES PRESENT, SUGGEST RECOLLECTION (A)  Final   Report Status 08/18/2020 FINAL  Final  SARS CORONAVIRUS 2 (TAT 6-24 HRS) Nasopharyngeal Nasopharyngeal Swab     Status: None   Collection Time: 08/17/20  6:34 AM   Specimen: Nasopharyngeal Swab  Result Value Ref Range Status   SARS Coronavirus 2 NEGATIVE NEGATIVE Final    Comment: (NOTE) SARS-CoV-2 target nucleic acids are NOT DETECTED.  The SARS-CoV-2 RNA is generally detectable in upper and lower respiratory specimens during the acute phase of infection. Negative results do not preclude SARS-CoV-2 infection, do not rule out co-infections with other pathogens, and should not be used as the sole basis for treatment or other patient management decisions. Negative results must be combined with clinical observations, patient history, and epidemiological information. The expected result is Negative.  Fact Sheet for Patients: SugarRoll.be  Fact Sheet for Healthcare Providers: https://www.woods-mathews.com/  This test is not yet approved or cleared by the [place] FDA and  has been authorized for detection and/or diagnosis of SARS-CoV-2 by FDA under an Emergency Use Authorization (EUA). This EUA will remain  in effect (meaning this test can be used) for the duration of the COVID-19 declaration under Se ction 564(b)(1) of the Act, 21 U.S.C. section 360bbb-3(b)(1), unless the authorization is terminated or revoked sooner.  Performed at [facility], [place] [address], [place],  [address]          Radiology Studies: DG Chest 2 View  Result Date: 08/17/2020 CLINICAL DATA:  Suspected sepsis, chest pain, shortness of breath. EXAM: CHEST - 2 VIEW COMPARISON:  Chest x-ray 07/05/2019 FINDINGS: The heart size and mediastinal contours are within normal limits. No focal consolidation. No pulmonary edema. No pleural effusion. No pneumothorax.  No acute osseous abnormality. Nonspecific air-fluid levels within the upper abdomen. IMPRESSION: 1. No active cardiopulmonary disease. 2.  Nonspecific air-fluid levels within the upper abdomen. Electronically Signed   By: Iven Finn M.D.   On: 08/17/2020 05:17  NM Pulmonary Perfusion  Result Date: 08/17/2020 CLINICAL DATA:  Dyspnea, chest pain EXAM: NUCLEAR MEDICINE PERFUSION LUNG SCAN TECHNIQUE: Perfusion images were obtained in multiple projections after intravenous injection of radiopharmaceutical. Ventilation scans intentionally deferred if perfusion scan and chest x-ray adequate for interpretation during COVID 19 epidemic. RADIOPHARMACEUTICALS:  4.3 mCi Tc-49mMAA IV COMPARISON:  07/05/2019 FINDINGS: AP, PA, bilateral anterior and posterior oblique, and bilateral true lateral views are presented. No perfusion abnormalities are identified. Normal distribution of radiotracer bilaterally. IMPRESSION: Normal examination.  Very low likelihood of pulmonary embolism. Electronically Signed   By: AFidela SalisburyMD   On: 08/17/2020 08:42   VAS [place]OWER EXTREMITY VENOUS (DVT)  Result Date: 08/17/2020  Lower Venous DVT Study Indications: Elevated Ddimer.  Risk Factors: None identified. Comparison Study: No prior studies. Performing Technologist: GOliver HumRVT  Examination Guidelines: A complete evaluation includes B-mode imaging, spectral Doppler, color Doppler, and power Doppler as needed of all accessible portions of each vessel. Bilateral testing is considered an integral part of a complete examination. Limited examinations for reoccurring indications may be performed as noted. The reflux portion of the exam is performed with the patient in reverse Trendelenburg.  +---------+---------------+---------+-----------+----------+--------------+ RIGHT    CompressibilityPhasicitySpontaneityPropertiesThrombus Aging +---------+---------------+---------+-----------+----------+--------------+ CFV       Full           Yes      Yes                                 +---------+---------------+---------+-----------+----------+--------------+ SFJ      Full                                                        +---------+---------------+---------+-----------+----------+--------------+ FV Prox  Full                                                        +---------+---------------+---------+-----------+----------+--------------+ FV Mid   Full                                                        +---------+---------------+---------+-----------+----------+--------------+ FV DistalFull                                                        +---------+---------------+---------+-----------+----------+--------------+ PFV      Full                                                        +---------+---------------+---------+-----------+----------+--------------+ POP      Full           Yes      Yes                                 +---------+---------------+---------+-----------+----------+--------------+  PTV      Full                                                        +---------+---------------+---------+-----------+----------+--------------+ PERO     Full                                                        +---------+---------------+---------+-----------+----------+--------------+   +---------+---------------+---------+-----------+----------+--------------+ LEFT     CompressibilityPhasicitySpontaneityPropertiesThrombus Aging +---------+---------------+---------+-----------+----------+--------------+ CFV      Full           Yes      Yes                                 +---------+---------------+---------+-----------+----------+--------------+ SFJ      Full                                                        +---------+---------------+---------+-----------+----------+--------------+ FV Prox  Full                                                         +---------+---------------+---------+-----------+----------+--------------+ FV Mid   Full                                                        +---------+---------------+---------+-----------+----------+--------------+ FV DistalFull                                                        +---------+---------------+---------+-----------+----------+--------------+ PFV      Full                                                        +---------+---------------+---------+-----------+----------+--------------+ POP      Full           Yes      Yes                                 +---------+---------------+---------+-----------+----------+--------------+ PTV      Full                                                        +---------+---------------+---------+-----------+----------+--------------+  PERO     Full                                                        +---------+---------------+---------+-----------+----------+--------------+     Summary: RIGHT: - There is no evidence of deep vein thrombosis in the lower extremity.  - No cystic structure found in the popliteal fossa.  LEFT: - There is no evidence of deep vein thrombosis in the lower extremity.  - No cystic structure found in the popliteal fossa.  *See table(s) above for measurements and observations. Electronically signed by Monica Martinez MD on 08/17/2020 at 4:03:46 PM.    Final         Scheduled Meds: . amLODipine  10 mg Oral Daily  . vitamin C  500 mg Oral Daily  . carvedilol  25 mg Oral BID WC  . gabapentin  100 mg Oral q AM  . heparin  5,000 Units Subcutaneous Q8H  . hydrALAZINE  50 mg Oral TID  . sertraline  100 mg Oral QHS  . sodium chloride flush  3 mL Intravenous Q12H  . zinc sulfate  220 mg Oral Daily   Continuous Infusions: . cefTRIAXone (ROCEPHIN)  IV Stopped (08/18/20 0626)  . lactated ringers       LOS: 1 day    Time spent: 30 minutes     Barb Merino,  MD [facility] Pager [phone]

## 2020-08-18 NOTE — Plan of Care (Signed)
  Problem: Education: Goal: Knowledge of General Education information will improve Description: Including pain rating scale, medication(s)/side effects and non-pharmacologic comfort measures Outcome: Progressing   Problem: Clinical Measurements: Goal: Respiratory complications will improve Outcome: Progressing   Problem: Activity: Goal: Risk for activity intolerance will decrease Outcome: Progressing   Problem: Nutrition: Goal: Adequate nutrition will be maintained Outcome: Progressing   Problem: Coping: Goal: Level of anxiety will decrease Outcome: Progressing   Problem: Elimination: Goal: Will not experience complications related to urinary retention Outcome: Progressing   Problem: Pain Managment: Goal: General experience of comfort will improve Outcome: Progressing   Problem: Safety: Goal: Ability to remain free from injury will improve Outcome: Progressing   Problem: Skin Integrity: Goal: Risk for impaired skin integrity will decrease Outcome: Progressing

## 2020-08-18 NOTE — ED Notes (Addendum)
Pt refusing tylenol for pain at this time. Will let Md about pts generalized pain.

## 2020-08-18 NOTE — ED Notes (Signed)
Lunch Tray Ordered @ 1034. 

## 2020-08-19 LAB — CBC WITH DIFFERENTIAL/PLATELET
Abs Immature Granulocytes: 0.14 10*3/uL — ABNORMAL HIGH (ref 0.00–0.07)
Basophils Absolute: 0 10*3/uL (ref 0.0–0.1)
Basophils Relative: 1 %
Eosinophils Absolute: 0.2 10*3/uL (ref 0.0–0.5)
Eosinophils Relative: 3 %
HCT: 24.1 % — ABNORMAL LOW (ref 36.0–46.0)
Hemoglobin: 8 g/dL — ABNORMAL LOW (ref 12.0–15.0)
Immature Granulocytes: 2 %
Lymphocytes Relative: 36 %
Lymphs Abs: 2.6 10*3/uL (ref 0.7–4.0)
MCH: 29.9 pg (ref 26.0–34.0)
MCHC: 33.2 g/dL (ref 30.0–36.0)
MCV: 89.9 fL (ref 80.0–100.0)
Monocytes Absolute: 0.7 10*3/uL (ref 0.1–1.0)
Monocytes Relative: 9 %
Neutro Abs: 3.6 10*3/uL (ref 1.7–7.7)
Neutrophils Relative %: 49 %
Platelets: 233 10*3/uL (ref 150–400)
RBC: 2.68 MIL/uL — ABNORMAL LOW (ref 3.87–5.11)
RDW: 13.1 % (ref 11.5–15.5)
WBC: 7.2 10*3/uL (ref 4.0–10.5)
nRBC: 0 % (ref 0.0–0.2)

## 2020-08-19 LAB — URINE CULTURE: Culture: NO GROWTH

## 2020-08-19 LAB — BASIC METABOLIC PANEL
Anion gap: 7 (ref 5–15)
BUN: 29 mg/dL — ABNORMAL HIGH (ref 6–20)
CO2: 16 mmol/L — ABNORMAL LOW (ref 22–32)
Calcium: 8 mg/dL — ABNORMAL LOW (ref 8.9–10.3)
Chloride: 114 mmol/L — ABNORMAL HIGH (ref 98–111)
Creatinine, Ser: 2.94 mg/dL — ABNORMAL HIGH (ref 0.44–1.00)
GFR, Estimated: 20 mL/min — ABNORMAL LOW (ref >60)
Glucose, Bld: 120 mg/dL — ABNORMAL HIGH (ref 70–99)
Potassium: 3.7 mmol/L (ref 3.5–5.1)
Sodium: 137 mmol/L (ref 135–145)

## 2020-08-19 LAB — PHOSPHORUS: Phosphorus: 3.4 mg/dL (ref 2.5–4.6)

## 2020-08-19 LAB — MAGNESIUM: Magnesium: 2.1 mg/dL (ref 1.7–2.4)

## 2020-08-19 NOTE — Discharge Summary (Signed)
Physician Discharge Summary  Jacqueline [name] [id number] DOB: 1980-10-20 DOA: 08/17/2020  PCP: Audley Hose, MD  Admit date: 08/17/2020 Discharge date: 08/19/2020  Admitted From: Home Disposition: Home  Recommendations for Outpatient Follow-up:  1. Follow up with PCP in 1-2 weeks 2. Please obtain BMP/CBC in one week 3. Please follow-up with your primary care physician as well as your kidney doctor in 1 to 2 weeks.  Home Health: Not applicable Equipment/Devices: Not needed  Discharge Condition: Stable CODE STATUS: Full code Diet recommendation: Low-salt diet  Discharge summary: 40 year old female with history of hypertension, type 2 diabetes, anemia of chronic kidney disease, obesity presented to ER with complaints of fever, chest discomfort after recent travel from [place] 2 days ago.  She arrived home Saturday, started developing headache, generalized muscle ache, nausea and diarrhea.  She had temperature 103 at home.  She had midsternal chest pain.  Denies any cough or sputum production.  Patient also had loose watery diarrhea. In the emergency room, temperature 101.8.  Chest x-ray was clear.  Urine was consistent with UTI.  COVID-19 was negative.  Patient was treated with IV fluids and started on Rocephin and her symptoms are stabilizing.  # Sepsis, present on admission possibly secondary to UTI or underlying viral syndrome. Sepsis resolved on discharge. COVID-19 negative.  Respiratory pathogen panel negative.  Blood cultures negative. Initial urine culture with multiple organisms.  Repeat urine cultures negative so far. She was resuscitated with IV fluids and given antibiotics with vancomycin, cefepime and metronidazole.  She was subsequently treated with Rocephin and received total 3 days of IV antibiotics. Currently no evidence of ongoing bacterial infection, antibiotics were discontinued and monitored. Patient has mostly improved, still has some watery diarrhea and she  will use some Imodium for her diet and hydrate herself very well.  Acute renal failure superimposed on chronic kidney disease stage IVb: Reported baseline creatinine about 2.1-2.2.  Presented with creatinine of 3.14.  Elevated D-dimer/recent long-haul travel: VQ scan low probability of pulmonary embolism.  Duplex is negative for DVT.  Chest pain: No evidence of acute coronary syndrome.  Musculoskeletal chest pain.none present.  Hypertensive urgency: Treated with home medications with Coreg, amlodipine and Lasix, hydralazine and already stabilizing. Resume all medicines.  Anemia of chronic kidney disease: Hemoglobin at about baseline.  Type 2 diabetes: Diet controlled at home.  Known A1c of 6.5.  Monitor.  Patient is asymptomatic today.  She is treated with 3 days of IV antibiotics.  Unsure whether her symptoms were from UTI or some viral syndrome.  However with overall stability and negative bacterial cultures, she is going home.  She will use Imodium for diarrhea.  She will hydrate herself.  She will hold Lasix until seen at follow-up visit with her primary care physician.  She is also scheduled to visit her nephrology in 2 weeks.    Discharge Diagnoses:  Principal Problem:   Sepsis ([place]) Active Problems:   Hypertensive urgency   Chest pain   Anemia of chronic disease   Viral illness   Elevated d-dimer   Acute lower UTI   Hypoalbuminemia    Discharge Instructions  Discharge Instructions    Call MD for:  difficulty breathing, headache or visual disturbances   Complete by: As directed    Call MD for:  persistant nausea and vomiting   Complete by: As directed    Diet - low sodium heart healthy   Complete by: As directed    Discharge instructions   Complete by:  As directed    Hydrate yourself well, drink plenty of water. Can use over the counter imodium 2 mg up to 4 times a day for watery diarrhea Do not take lasix until follow up with your doctor , will need a repeat  kidney function test in 1-2 weeks.   Increase activity slowly   Complete by: As directed      Allergies as of 08/19/2020   No Known Allergies     Medication List    STOP taking these medications   butalbital-acetaminophen-caffeine 50-325-40 MG tablet Commonly known as: FIORICET   diphenhydrAMINE 25 mg capsule Commonly known as: BENADRYL   prochlorperazine 10 MG tablet Commonly known as: COMPAZINE     TAKE these medications   Airborne Elderberry Chew Chew 2 tablets by mouth daily.   amLODipine 10 MG tablet Commonly known as: NORVASC Take 1 tablet (10 mg total) by mouth daily.   carvedilol 25 MG tablet Commonly known as: COREG Take 1 tablet (25 mg total) by mouth 2 (two) times daily with a meal.   diclofenac Sodium 1 % Gel Commonly known as: VOLTAREN Apply 2 g topically 4 (four) times daily as needed (pain).   furosemide 40 MG tablet Commonly known as: LASIX Take 40 mg by mouth daily as needed for fluid or edema.   gabapentin 100 MG capsule Commonly known as: NEURONTIN Take 100 mg by mouth in the morning.   hydrALAZINE 50 MG tablet Commonly known as: APRESOLINE Take 1 tablet (50 mg total) by mouth 3 (three) times daily.   sertraline 100 MG tablet Commonly known as: ZOLOFT Take 100 mg by mouth at bedtime.   Vitamin D (Ergocalciferol) 1.25 MG (50000 UNIT) Caps capsule Commonly known as: DRISDOL Take 50,000 Units by mouth every Thursday.       Follow-up Information    Bakare, Mobolaji B, MD Follow up in 1 week(s).   Specialty: Internal Medicine Contact information: [place] [address] [phone]              No Known Allergies  Consultations:  None   Procedures/Studies: DG Chest 2 View  Result Date: 08/17/2020 CLINICAL DATA:  Suspected sepsis, chest pain, shortness of breath. EXAM: CHEST - 2 VIEW COMPARISON:  Chest x-ray 07/05/2019 FINDINGS: The heart size and mediastinal contours are within normal limits.  No focal consolidation. No pulmonary edema. No pleural effusion. No pneumothorax. No acute osseous abnormality. Nonspecific air-fluid levels within the upper abdomen. IMPRESSION: 1. No active cardiopulmonary disease. 2.  Nonspecific air-fluid levels within the upper abdomen. Electronically Signed   By: Iven Finn M.D.   On: 08/17/2020 05:17   NM Pulmonary Perfusion  Result Date: 08/17/2020 CLINICAL DATA:  Dyspnea, chest pain EXAM: NUCLEAR MEDICINE PERFUSION LUNG SCAN TECHNIQUE: Perfusion images were obtained in multiple projections after intravenous injection of radiopharmaceutical. Ventilation scans intentionally deferred if perfusion scan and chest x-ray adequate for interpretation during COVID 19 epidemic. RADIOPHARMACEUTICALS:  4.3 mCi Tc-39mMAA IV COMPARISON:  07/05/2019 FINDINGS: AP, PA, bilateral anterior and posterior oblique, and bilateral true lateral views are presented. No perfusion abnormalities are identified. Normal distribution of radiotracer bilaterally. IMPRESSION: Normal examination.  Very low likelihood of pulmonary embolism. Electronically Signed   By: AFidela SalisburyMD   On: 08/17/2020 08:42   VAS [place]OWER EXTREMITY VENOUS (DVT)  Result Date: 08/17/2020  Lower Venous DVT Study Indications: Elevated Ddimer.  Risk Factors: None identified. Comparison Study: No prior studies. Performing Technologist: GOliver HumRVT  Examination Guidelines: A complete evaluation includes B-mode imaging, spectral Doppler, color Doppler, and power Doppler as needed of all accessible portions of each vessel. Bilateral testing is considered an integral part of a complete examination. Limited examinations for reoccurring indications may be performed as noted. The reflux portion of the exam is performed with the patient in reverse Trendelenburg.  +---------+---------------+---------+-----------+----------+--------------+ RIGHT    CompressibilityPhasicitySpontaneityPropertiesThrombus Aging  +---------+---------------+---------+-----------+----------+--------------+ CFV      Full           Yes      Yes                                 +---------+---------------+---------+-----------+----------+--------------+ SFJ      Full                                                        +---------+---------------+---------+-----------+----------+--------------+ FV Prox  Full                                                        +---------+---------------+---------+-----------+----------+--------------+ FV Mid   Full                                                        +---------+---------------+---------+-----------+----------+--------------+ FV DistalFull                                                        +---------+---------------+---------+-----------+----------+--------------+ PFV      Full                                                        +---------+---------------+---------+-----------+----------+--------------+ POP      Full           Yes      Yes                                 +---------+---------------+---------+-----------+----------+--------------+ PTV      Full                                                        +---------+---------------+---------+-----------+----------+--------------+ PERO     Full                                                        +---------+---------------+---------+-----------+----------+--------------+   +---------+---------------+---------+-----------+----------+--------------+  LEFT     CompressibilityPhasicitySpontaneityPropertiesThrombus Aging +---------+---------------+---------+-----------+----------+--------------+ CFV      Full           Yes      Yes                                 +---------+---------------+---------+-----------+----------+--------------+ SFJ      Full                                                         +---------+---------------+---------+-----------+----------+--------------+ FV Prox  Full                                                        +---------+---------------+---------+-----------+----------+--------------+ FV Mid   Full                                                        +---------+---------------+---------+-----------+----------+--------------+ FV DistalFull                                                        +---------+---------------+---------+-----------+----------+--------------+ PFV      Full                                                        +---------+---------------+---------+-----------+----------+--------------+ POP      Full           Yes      Yes                                 +---------+---------------+---------+-----------+----------+--------------+ PTV      Full                                                        +---------+---------------+---------+-----------+----------+--------------+ PERO     Full                                                        +---------+---------------+---------+-----------+----------+--------------+     Summary: RIGHT: - There is no evidence of deep vein thrombosis in the lower extremity.  - No cystic structure found in the popliteal fossa.  LEFT: - There is no evidence of deep vein thrombosis in the lower extremity.  - No  cystic structure found in the popliteal fossa.  *See table(s) above for measurements and observations. Electronically signed by Monica Martinez MD on 08/17/2020 at 4:03:46 PM.    Final    (Echo, Carotid, EGD, Colonoscopy, ERCP)    Subjective: Patient seen and examined.  Denies any complaints.  Had some nausea last night after dinner but that has subsided since then. Afebrile.  Had 3-4 episodes of small-volume loose watery stool overnight.  Denies any abdominal pain or cramping.  Eager to go home.   Discharge Exam: Vitals:   08/19/20 0743 08/19/20 0957  BP: (!)  154/87 114/69  Pulse: 84 93  Resp: 17 18  Temp: 98.4 F (36.9 C)   SpO2: 100%    Vitals:   08/19/20 0010 08/19/20 0541 08/19/20 0743 08/19/20 0957  BP: 116/70 (!) 146/81 (!) 154/87 114/69  Pulse: 87 78 84 93  Resp:  '20 17 18  '$ Temp:  98.6 F (37 C) 98.4 F (36.9 C)   TempSrc:  Oral Oral   SpO2:  100% 100%   Weight:  93.6 kg    Height:        General: Pt is alert, awake, not in acute distress Cardiovascular: RRR, S1/S2 +, no rubs, no gallops Respiratory: CTA bilaterally, no wheezing, no rhonchi Abdominal: Soft, NT, ND, bowel sounds + Extremities: no edema, no cyanosis    The results of significant diagnostics from this hospitalization (including imaging, microbiology, ancillary and laboratory) are listed below for reference.     Microbiology: Recent Results (from the past 240 hour(s))  Culture, blood (Routine x 2)     Status: None (Preliminary result)   Collection Time: 08/17/20  4:45 AM   Specimen: BLOOD  Result Value Ref Range Status   Specimen Description BLOOD RIGHT ARM  Final   Special Requests   Final    BOTTLES DRAWN AEROBIC AND ANAEROBIC Blood Culture results may not be optimal due to an inadequate volume of blood received in culture bottles   Culture   Final    NO GROWTH 2 DAYS Performed at [facility], [place] [address] [place], [place] [address]    Report Status PENDING  Incomplete  Culture, blood (Routine x 2)     Status: None (Preliminary result)   Collection Time: 08/17/20  4:53 AM   Specimen: BLOOD  Result Value Ref Range Status   Specimen Description BLOOD LEFT ARM  Final   Special Requests   Final    BOTTLES DRAWN AEROBIC AND ANAEROBIC Blood Culture results may not be optimal due to an inadequate volume of blood received in culture bottles   Culture   Final    NO GROWTH 2 DAYS Performed at [facility], [place] [address] [place], [place] [address]    Report Status PENDING  Incomplete  Urine culture     Status: Abnormal   Collection  Time: 08/17/20  5:29 AM   Specimen: In/Out Cath Urine  Result Value Ref Range Status   Specimen Description IN/OUT CATH URINE  Final   Special Requests   Final    NONE Performed at [facility], [place] [address] [place], [place] [address]    Culture MULTIPLE SPECIES PRESENT, SUGGEST RECOLLECTION (A)  Final   Report Status 08/18/2020 FINAL  Final  SARS CORONAVIRUS 2 (TAT 6-24 HRS) Nasopharyngeal Nasopharyngeal Swab     Status: None   Collection Time: 08/17/20  6:34 AM   Specimen: Nasopharyngeal Swab  Result Value Ref Range Status  SARS Coronavirus 2 NEGATIVE NEGATIVE Final    Comment: (NOTE) SARS-CoV-2 target nucleic acids are NOT DETECTED.  The SARS-CoV-2 RNA is generally detectable in upper and lower respiratory specimens during the acute phase of infection. Negative results do not preclude SARS-CoV-2 infection, do not rule out co-infections with other pathogens, and should not be used as the sole basis for treatment or other patient management decisions. Negative results must be combined with clinical observations, patient history, and epidemiological information. The expected result is Negative.  Fact Sheet for Patients: SugarRoll.be  Fact Sheet for Healthcare Providers: https://www.woods-mathews.com/  This test is not yet approved or cleared by the [place] FDA and  has been authorized for detection and/or diagnosis of SARS-CoV-2 by FDA under an Emergency Use Authorization (EUA). This EUA will remain  in effect (meaning this test can be used) for the duration of the COVID-19 declaration under Se ction 564(b)(1) of the Act, 21 U.S.C. section 360bbb-3(b)(1), unless the authorization is terminated or revoked sooner.  Performed at [facility], [place] [address] [place], [place] [address]   Culture, Urine     Status: None   Collection Time: 08/18/20  1:43 PM   Specimen: Urine, Random  Result Value Ref Range Status    Specimen Description URINE, RANDOM  Final   Special Requests NONE  Final   Culture   Final    NO GROWTH Performed at [facility], [place] [address], [place], [place] [address]    Report Status 08/19/2020 FINAL  Final     Labs: BNP (last 3 results) Recent Labs    08/17/20 1209  BNP 123456*   Basic Metabolic Panel: Recent Labs  Lab 08/17/20 0454 08/18/20 0256 08/19/20 0429  NA 139 141 137  K 3.6 3.8 3.7  CL 111 116* 114*  CO2 17* 15* 16*  GLUCOSE 125* 136* 120*  BUN 34* 29* 29*  CREATININE 3.14* 2.97* 2.94*  CALCIUM 8.3* 8.0* 8.0*  MG  --   --  2.1  PHOS  --   --  3.4   Liver Function Tests: Recent Labs  Lab 08/17/20 0454 08/18/20 0256  AST 24 19  ALT 27 20  ALKPHOS 80 69  BILITOT 1.0 1.2  PROT 6.0* 5.5*  ALBUMIN 2.5* 2.1*   No results for input(s): LIPASE, AMYLASE in the last 168 hours. No results for input(s): AMMONIA in the last 168 hours. CBC: Recent Labs  Lab 08/17/20 0454 08/18/20 0256 08/19/20 0429  WBC 9.8 6.7 7.2  NEUTROABS 7.5 3.8 3.6  HGB 9.5* 8.6* 8.0*  HCT 29.6* 27.5* 24.1*  MCV 91.9 94.5 89.9  PLT 282 260 233   Cardiac Enzymes: No results for input(s): CKTOTAL, CKMB, CKMBINDEX, TROPONINI in the last 168 hours. BNP: Invalid input(s): POCBNP CBG: No results for input(s): GLUCAP in the last 168 hours. D-Dimer Recent Labs    08/17/20 0454  DDIMER 3.89*   Hgb A1c No results for input(s): HGBA1C in the last 72 hours. Lipid Profile No results for input(s): CHOL, HDL, LDLCALC, TRIG, CHOLHDL, LDLDIRECT in the last 72 hours. Thyroid function studies No results for input(s): TSH, T4TOTAL, T3FREE, THYROIDAB in the last 72 hours.  Invalid input(s): FREET3 Anemia work up Recent Labs    08/17/20 1209  FERRITIN 98   Urinalysis    Component Value Date/Time   COLORURINE YELLOW 08/17/2020 0634   APPEARANCEUR CLOUDY (A) 08/17/2020 0634   LABSPEC 1.020 08/17/2020 [place] 6.0 08/17/2020 0634   GLUCOSEU  NEGATIVE 08/17/2020  0634   HGBUR SMALL (A) 08/17/2020 0634   BILIRUBINUR NEGATIVE 08/17/2020 0634   KETONESUR NEGATIVE 08/17/2020 0634   PROTEINUR >300 (A) 08/17/2020 0634   UROBILINOGEN 0.2 08/21/2012 2130   NITRITE POSITIVE (A) 08/17/2020 0634   LEUKOCYTESUR NEGATIVE 08/17/2020 0634   Sepsis Labs Invalid input(s): PROCALCITONIN,  WBC,  LACTICIDVEN Microbiology Recent Results (from the past 240 hour(s))  Culture, blood (Routine x 2)     Status: None (Preliminary result)   Collection Time: 08/17/20  4:45 AM   Specimen: BLOOD  Result Value Ref Range Status   Specimen Description BLOOD RIGHT ARM  Final   Special Requests   Final    BOTTLES DRAWN AEROBIC AND ANAEROBIC Blood Culture results may not be optimal due to an inadequate volume of blood received in culture bottles   Culture   Final    NO GROWTH 2 DAYS Performed at [facility], [place] [address], [place], [place] [address]    Report Status PENDING  Incomplete  Culture, blood (Routine x 2)     Status: None (Preliminary result)   Collection Time: 08/17/20  4:53 AM   Specimen: BLOOD  Result Value Ref Range Status   Specimen Description BLOOD LEFT ARM  Final   Special Requests   Final    BOTTLES DRAWN AEROBIC AND ANAEROBIC Blood Culture results may not be optimal due to an inadequate volume of blood received in culture bottles   Culture   Final    NO GROWTH 2 DAYS Performed at [facility], [place] [address], [place], [place] [address]    Report Status PENDING  Incomplete  Urine culture     Status: Abnormal   Collection Time: 08/17/20  5:29 AM   Specimen: In/Out Cath Urine  Result Value Ref Range Status   Specimen Description IN/OUT CATH URINE  Final   Special Requests   Final    NONE Performed at [facility], [place] [address], [place], [place] [address]    Culture MULTIPLE SPECIES PRESENT, SUGGEST RECOLLECTION (A)  Final   Report Status 08/18/2020 FINAL  Final  SARS CORONAVIRUS 2 (TAT 6-24 HRS) Nasopharyngeal Nasopharyngeal  Swab     Status: None   Collection Time: 08/17/20  6:34 AM   Specimen: Nasopharyngeal Swab  Result Value Ref Range Status   SARS Coronavirus 2 NEGATIVE NEGATIVE Final    Comment: (NOTE) SARS-CoV-2 target nucleic acids are NOT DETECTED.  The SARS-CoV-2 RNA is generally detectable in upper and lower respiratory specimens during the acute phase of infection. Negative results do not preclude SARS-CoV-2 infection, do not rule out co-infections with other pathogens, and should not be used as the sole basis for treatment or other patient management decisions. Negative results must be combined with clinical observations, patient history, and epidemiological information. The expected result is Negative.  Fact Sheet for Patients: SugarRoll.be  Fact Sheet for Healthcare Providers: https://www.woods-mathews.com/  This test is not yet approved or cleared by the [place] FDA and  has been authorized for detection and/or diagnosis of SARS-CoV-2 by FDA under an Emergency Use Authorization (EUA). This EUA will remain  in effect (meaning this test can be used) for the duration of the COVID-19 declaration under Se ction 564(b)(1) of the Act, 21 U.S.C. section 360bbb-3(b)(1), unless the authorization is terminated or revoked sooner.  Performed at [facility], [place] [address], [place], [place] [address]   Culture, Urine     Status: None  Collection Time: 08/18/20  1:43 PM   Specimen: Urine, Random  Result Value Ref Range Status   Specimen Description URINE, RANDOM  Final   Special Requests NONE  Final   Culture   Final    NO GROWTH Performed at [facility], [address] [place], [place] [address]    Report Status 08/19/2020 FINAL  Final     Time coordinating discharge:  32 minutes  SIGNED:   Barb Merino, MD  [facility] 08/19/2020, 2:53 PM

## 2020-08-19 NOTE — Progress Notes (Signed)
Jacqueline [name] to be Discharged home per MD order.  Discussed prescriptions to discontinue at home and follow up appointments with the patient. No new prescriptions ordered for patient to go home with, medication list explained in detail. Patient verbalized understanding.  Allergies as of 08/19/2020   No Known Allergies     Medication List    STOP taking these medications   butalbital-acetaminophen-caffeine 50-325-40 MG tablet Commonly known as: FIORICET   diphenhydrAMINE 25 mg capsule Commonly known as: BENADRYL   prochlorperazine 10 MG tablet Commonly known as: COMPAZINE     TAKE these medications   Airborne Elderberry Chew Chew 2 tablets by mouth daily.   amLODipine 10 MG tablet Commonly known as: NORVASC Take 1 tablet (10 mg total) by mouth daily.   carvedilol 25 MG tablet Commonly known as: COREG Take 1 tablet (25 mg total) by mouth 2 (two) times daily with a meal.   diclofenac Sodium 1 % Gel Commonly known as: VOLTAREN Apply 2 g topically 4 (four) times daily as needed (pain).   furosemide 40 MG tablet Commonly known as: LASIX Take 40 mg by mouth daily as needed for fluid or edema.   gabapentin 100 MG capsule Commonly known as: NEURONTIN Take 100 mg by mouth in the morning.   hydrALAZINE 50 MG tablet Commonly known as: APRESOLINE Take 1 tablet (50 mg total) by mouth 3 (three) times daily.   sertraline 100 MG tablet Commonly known as: ZOLOFT Take 100 mg by mouth at bedtime.   Vitamin D (Ergocalciferol) 1.25 MG (50000 UNIT) Caps capsule Commonly known as: DRISDOL Take 50,000 Units by mouth every Thursday.       Vitals:   08/19/20 0743 08/19/20 0957  BP: (!) 154/87 114/69  Pulse: 84   Resp: 17   Temp: 98.4 F (36.9 C)   SpO2: 100%     Skin clean, dry and intact without evidence of skin break down, no evidence of skin tears noted. IV catheter discontinued. Site without signs and symptoms of complications. Dressing and pressure applied. Telemetry  monitor discontinued. Pt denies pain at this time. No complaints noted.  An After Visit Summary was printed and given to the patient. Patient escorted via Wheelchair, and Discharged home via private auto with family member.  [place] 08/19/2020 10:30am

## 2020-08-19 NOTE — Plan of Care (Signed)

## 2020-08-20 LAB — PATHOLOGIST SMEAR REVIEW

## 2020-08-22 LAB — CULTURE, BLOOD (ROUTINE X 2)
Culture: NO GROWTH
Culture: NO GROWTH

## 2020-09-02 DIAGNOSIS — E65 Localized adiposity: Secondary | ICD-10-CM | POA: Diagnosis not present

## 2020-09-22 DIAGNOSIS — F411 Generalized anxiety disorder: Secondary | ICD-10-CM | POA: Diagnosis not present

## 2020-09-22 DIAGNOSIS — F321 Major depressive disorder, single episode, moderate: Secondary | ICD-10-CM | POA: Diagnosis not present

## 2020-09-22 DIAGNOSIS — I160 Hypertensive urgency: Secondary | ICD-10-CM | POA: Diagnosis not present

## 2020-09-22 DIAGNOSIS — Z9884 Bariatric surgery status: Secondary | ICD-10-CM | POA: Diagnosis not present

## 2020-09-28 DIAGNOSIS — R809 Proteinuria, unspecified: Secondary | ICD-10-CM | POA: Diagnosis not present

## 2020-09-28 DIAGNOSIS — N1832 Chronic kidney disease, stage 3b: Secondary | ICD-10-CM | POA: Diagnosis not present

## 2020-09-28 DIAGNOSIS — N2581 Secondary hyperparathyroidism of renal origin: Secondary | ICD-10-CM | POA: Diagnosis not present

## 2020-09-28 DIAGNOSIS — N189 Chronic kidney disease, unspecified: Secondary | ICD-10-CM | POA: Diagnosis not present

## 2020-09-28 DIAGNOSIS — D631 Anemia in chronic kidney disease: Secondary | ICD-10-CM | POA: Diagnosis not present

## 2020-09-28 DIAGNOSIS — E1122 Type 2 diabetes mellitus with diabetic chronic kidney disease: Secondary | ICD-10-CM | POA: Diagnosis not present

## 2020-09-28 DIAGNOSIS — I129 Hypertensive chronic kidney disease with stage 1 through stage 4 chronic kidney disease, or unspecified chronic kidney disease: Secondary | ICD-10-CM | POA: Diagnosis not present

## 2020-10-13 ENCOUNTER — Other Ambulatory Visit (HOSPITAL_COMMUNITY): Payer: Self-pay | Admitting: *Deleted

## 2020-10-14 ENCOUNTER — Inpatient Hospital Stay (HOSPITAL_COMMUNITY)
Admission: RE | Admit: 2020-10-14 | Discharge: 2020-10-14 | Disposition: A | Discharge status: Home / Self Care | Payer: BC Managed Care – PPO | Source: Ambulatory Visit | Attending: Nephrology | Admitting: Nephrology

## 2020-10-14 ENCOUNTER — Encounter (HOSPITAL_COMMUNITY): Payer: Self-pay

## 2020-10-28 ENCOUNTER — Encounter (HOSPITAL_COMMUNITY): Payer: BC Managed Care – PPO

## 2021-01-12 DIAGNOSIS — R600 Localized edema: Secondary | ICD-10-CM | POA: Diagnosis not present

## 2021-01-12 DIAGNOSIS — N390 Urinary tract infection, site not specified: Secondary | ICD-10-CM | POA: Diagnosis not present

## 2021-01-12 DIAGNOSIS — Z20828 Contact with and (suspected) exposure to other viral communicable diseases: Secondary | ICD-10-CM | POA: Diagnosis not present

## 2021-01-12 DIAGNOSIS — I160 Hypertensive urgency: Secondary | ICD-10-CM | POA: Diagnosis not present

## 2021-01-12 DIAGNOSIS — R109 Unspecified abdominal pain: Secondary | ICD-10-CM | POA: Diagnosis not present

## 2021-01-12 DIAGNOSIS — R112 Nausea with vomiting, unspecified: Secondary | ICD-10-CM | POA: Diagnosis not present

## 2021-01-19 DIAGNOSIS — R600 Localized edema: Secondary | ICD-10-CM | POA: Diagnosis not present

## 2021-01-19 DIAGNOSIS — E1122 Type 2 diabetes mellitus with diabetic chronic kidney disease: Secondary | ICD-10-CM | POA: Diagnosis not present

## 2021-01-19 DIAGNOSIS — E1121 Type 2 diabetes mellitus with diabetic nephropathy: Secondary | ICD-10-CM | POA: Diagnosis not present

## 2021-01-19 DIAGNOSIS — E1165 Type 2 diabetes mellitus with hyperglycemia: Secondary | ICD-10-CM | POA: Diagnosis not present

## 2021-01-19 DIAGNOSIS — E782 Mixed hyperlipidemia: Secondary | ICD-10-CM | POA: Diagnosis not present

## 2021-01-19 DIAGNOSIS — R109 Unspecified abdominal pain: Secondary | ICD-10-CM | POA: Diagnosis not present

## 2021-01-19 DIAGNOSIS — R112 Nausea with vomiting, unspecified: Secondary | ICD-10-CM | POA: Diagnosis not present

## 2021-01-19 DIAGNOSIS — I10 Essential (primary) hypertension: Secondary | ICD-10-CM | POA: Diagnosis not present

## 2021-03-25 ENCOUNTER — Encounter (HOSPITAL_COMMUNITY): Payer: Self-pay

## 2021-03-31 ENCOUNTER — Ambulatory Visit (INDEPENDENT_AMBULATORY_CARE_PROVIDER_SITE_OTHER): Payer: BC Managed Care – PPO

## 2021-03-31 ENCOUNTER — Ambulatory Visit (INDEPENDENT_AMBULATORY_CARE_PROVIDER_SITE_OTHER): Payer: BC Managed Care – PPO | Admitting: Podiatry

## 2021-03-31 ENCOUNTER — Other Ambulatory Visit: Payer: Self-pay

## 2021-03-31 ENCOUNTER — Other Ambulatory Visit: Payer: Self-pay | Admitting: Podiatry

## 2021-03-31 DIAGNOSIS — M7752 Other enthesopathy of left foot: Secondary | ICD-10-CM | POA: Diagnosis not present

## 2021-03-31 DIAGNOSIS — M7671 Peroneal tendinitis, right leg: Secondary | ICD-10-CM

## 2021-03-31 DIAGNOSIS — M79671 Pain in right foot: Secondary | ICD-10-CM

## 2021-03-31 DIAGNOSIS — M7751 Other enthesopathy of right foot: Secondary | ICD-10-CM | POA: Diagnosis not present

## 2021-03-31 DIAGNOSIS — M79672 Pain in left foot: Secondary | ICD-10-CM

## 2021-03-31 DIAGNOSIS — M7750 Other enthesopathy of unspecified foot: Secondary | ICD-10-CM

## 2021-03-31 DIAGNOSIS — E11610 Type 2 diabetes mellitus with diabetic neuropathic arthropathy: Secondary | ICD-10-CM

## 2021-03-31 MED ORDER — TRIAMCINOLONE ACETONIDE 10 MG/ML IJ SUSP
10.0000 mg | Freq: Once | INTRAMUSCULAR | Status: AC
  Administered 2021-03-31: 10 mg

## 2021-04-01 NOTE — Progress Notes (Signed)
Subjective:   Patient ID: Jacqueline [name], female   DOB: 40 y.o.   MRN: [id number]   HPI Patient presents with discomfort and swelling of her foot with history of fracture which occurred approximately a year ago with inflammation of the outside of the foot at this time.  States that she still has swelling of her foot but she seems to live okay with that and is doing a good job now taking care of her sugar   ROS      Objective:  Physical Exam  Neurovascular status is intact currently with no hot warm foot noted with edema noted in the foot both midfoot and into the ankle right with inflammation pain around the lateral side base of fifth metatarsal area with A1c that has now entered normal range     Assessment:  Patient who developed major fractures of the midfoot probability of Charcot type disease that appears to be consolidated with inflammatory changes lateral side foot     Plan:  H&P reviewed all conditions discussed with her again her Charcot structure and the fact that someday in her life surgical intervention may be necessary for the issues but at this point we will continue to monitor her from a symptomatic standpoint.  I do think this is inflammatory so I went ahead did sterile prep and injected the lateral side of the tendon complex peroneal insertion 3 mg Dexasone Kenalog 5 mg Xylocaine  X-rays indicate that there is what appears to be consolidation of the midfoot fractures right with still multiple arthritic issues associated with the pathology

## 2021-05-06 ENCOUNTER — Other Ambulatory Visit: Payer: Self-pay

## 2021-05-06 ENCOUNTER — Ambulatory Visit (INDEPENDENT_AMBULATORY_CARE_PROVIDER_SITE_OTHER): Payer: BC Managed Care – PPO

## 2021-05-06 DIAGNOSIS — M7671 Peroneal tendinitis, right leg: Secondary | ICD-10-CM

## 2021-05-06 NOTE — Progress Notes (Signed)
Patient in office to pick-up custom orthotics today. Patient was unable to try them on due to the type of shoe she wore today. Educated the patient on the break-in process at this time and how to properly wear the orthotics. Patient verbalized understanding. Advised to call the office with any questions, comments or concerns.

## 2021-05-17 ENCOUNTER — Emergency Department (HOSPITAL_COMMUNITY): Payer: BC Managed Care – PPO

## 2021-05-17 ENCOUNTER — Emergency Department (HOSPITAL_COMMUNITY)
Admission: EM | Admit: 2021-05-17 | Discharge: 2021-05-17 | Disposition: A | Discharge status: Home / Self Care | Payer: BC Managed Care – PPO | Attending: Emergency Medicine | Admitting: Emergency Medicine

## 2021-05-17 ENCOUNTER — Encounter (HOSPITAL_COMMUNITY): Payer: Self-pay

## 2021-05-17 ENCOUNTER — Other Ambulatory Visit: Payer: Self-pay

## 2021-05-17 DIAGNOSIS — J101 Influenza due to other identified influenza virus with other respiratory manifestations: Secondary | ICD-10-CM | POA: Diagnosis not present

## 2021-05-17 DIAGNOSIS — N183 Chronic kidney disease, stage 3 unspecified: Secondary | ICD-10-CM | POA: Diagnosis not present

## 2021-05-17 DIAGNOSIS — Z79899 Other long term (current) drug therapy: Secondary | ICD-10-CM | POA: Insufficient documentation

## 2021-05-17 DIAGNOSIS — R509 Fever, unspecified: Secondary | ICD-10-CM | POA: Diagnosis not present

## 2021-05-17 DIAGNOSIS — Z20822 Contact with and (suspected) exposure to covid-19: Secondary | ICD-10-CM | POA: Diagnosis not present

## 2021-05-17 DIAGNOSIS — R000 Tachycardia, unspecified: Secondary | ICD-10-CM | POA: Diagnosis not present

## 2021-05-17 DIAGNOSIS — J45909 Unspecified asthma, uncomplicated: Secondary | ICD-10-CM | POA: Insufficient documentation

## 2021-05-17 DIAGNOSIS — R079 Chest pain, unspecified: Secondary | ICD-10-CM | POA: Diagnosis not present

## 2021-05-17 DIAGNOSIS — I5033 Acute on chronic diastolic (congestive) heart failure: Secondary | ICD-10-CM | POA: Insufficient documentation

## 2021-05-17 DIAGNOSIS — J111 Influenza due to unidentified influenza virus with other respiratory manifestations: Secondary | ICD-10-CM | POA: Diagnosis not present

## 2021-05-17 DIAGNOSIS — N9489 Other specified conditions associated with female genital organs and menstrual cycle: Secondary | ICD-10-CM | POA: Insufficient documentation

## 2021-05-17 DIAGNOSIS — E1122 Type 2 diabetes mellitus with diabetic chronic kidney disease: Secondary | ICD-10-CM | POA: Diagnosis not present

## 2021-05-17 DIAGNOSIS — I130 Hypertensive heart and chronic kidney disease with heart failure and stage 1 through stage 4 chronic kidney disease, or unspecified chronic kidney disease: Secondary | ICD-10-CM | POA: Insufficient documentation

## 2021-05-17 LAB — BASIC METABOLIC PANEL
Anion gap: 5 (ref 5–15)
BUN: 26 mg/dL — ABNORMAL HIGH (ref 6–20)
CO2: 19 mmol/L — ABNORMAL LOW (ref 22–32)
Calcium: 7.7 mg/dL — ABNORMAL LOW (ref 8.9–10.3)
Chloride: 112 mmol/L — ABNORMAL HIGH (ref 98–111)
Creatinine, Ser: 3.24 mg/dL — ABNORMAL HIGH (ref 0.44–1.00)
GFR, Estimated: 18 mL/min — ABNORMAL LOW (ref >60)
Glucose, Bld: 158 mg/dL — ABNORMAL HIGH (ref 70–99)
Potassium: 4.3 mmol/L (ref 3.5–5.1)
Sodium: 136 mmol/L (ref 135–145)

## 2021-05-17 LAB — CBC
HCT: 27.9 % — ABNORMAL LOW (ref 36.0–46.0)
Hemoglobin: 8.8 g/dL — ABNORMAL LOW (ref 12.0–15.0)
MCH: 30.3 pg (ref 26.0–34.0)
MCHC: 31.5 g/dL (ref 30.0–36.0)
MCV: 96.2 fL (ref 80.0–100.0)
Platelets: 278 10*3/uL (ref 150–400)
RBC: 2.9 MIL/uL — ABNORMAL LOW (ref 3.87–5.11)
RDW: 14.2 % (ref 11.5–15.5)
WBC: 6.1 10*3/uL (ref 4.0–10.5)
nRBC: 0 % (ref 0.0–0.2)

## 2021-05-17 LAB — I-STAT BETA HCG BLOOD, ED (MC, WL, AP ONLY): I-stat hCG, quantitative: <5 m[IU]/mL (ref <5)

## 2021-05-17 LAB — RESP PANEL BY RT-PCR (FLU A&B, COVID) ARPGX2
Influenza A by PCR: POSITIVE — AB
Influenza B by PCR: NEGATIVE
SARS Coronavirus 2 by RT PCR: NEGATIVE

## 2021-05-17 MED ORDER — IBUPROFEN 800 MG PO TABS
800.0000 mg | ORAL_TABLET | Freq: Once | ORAL | Status: AC
  Administered 2021-05-17: 800 mg via ORAL
  Filled 2021-05-17: qty 1

## 2021-05-17 MED ORDER — OSELTAMIVIR PHOSPHATE 75 MG PO CAPS: 75.0000 mg | ORAL_CAPSULE | Freq: Two times a day (BID) | ORAL | 0 refills | Status: AC

## 2021-05-17 NOTE — ED Provider Notes (Signed)
[facility] EMERGENCY DEPARTMENT Provider Note   CSN: [id number] Arrival date & time: 05/17/21  0750     History Chief Complaint  Patient presents with   Chest Pain   Shortness of Breath   Fever   Generalized Body Aches   Nausea    Jacqueline [name] is a 40 y.o. female.  The history is provided by the patient.  URI Presenting symptoms: congestion, cough, fatigue and fever   Presenting symptoms: no ear pain and no sore throat   Severity:  Mild Onset quality:  Gradual Duration:  2 days Timing:  Intermittent Progression:  Waxing and waning Chronicity:  New Worsened by:  Nothing Ineffective treatments:  OTC medications Associated symptoms: no arthralgias   Risk factors: chronic kidney disease       Past Medical History:  Diagnosis Date   Anemia    Anxiety    Chronic bronchitis (HCC)    Hypertension    Increased frequency of headaches    Morbid obesity ([place])    Sleep apnea    Type II diabetes mellitus ([place])     Patient Active Problem List   Diagnosis Date Noted   Viral illness 08/17/2020   Elevated d-dimer 08/17/2020   Acute lower UTI 08/17/2020   Hypoalbuminemia 19/37/9024   Complicated migraine 09/73/5329   Right hemiplegia ([place]) 12/02/2019   Hypertension    AKI (acute kidney injury) ([place])    CKD (chronic kidney disease) stage 3, GFR 30-59 ml/min    Obesity, Class III, BMI 40-49.9 (morbid obesity) (HCC)    Insulin dependent diabetes mellitus    Anemia of chronic disease    HLD (hyperlipidemia) 07/02/2018   Acute on chronic diastolic CHF (congestive heart failure) ([place]) 07/02/2018   Depression 07/02/2018   OSA (obstructive sleep apnea) 07/02/2018   Acute on chronic diastolic (congestive) heart failure ([place]) 07/02/2018   Cellulitis of right lower extremity 07/02/2018   Anemia 07/02/2018   Type 2 diabetes mellitus with hyperglycemia, with long-term current use of insulin ([place]) 07/02/2018   Chest pain 05/04/2018   Hypertensive urgency  05/03/2018   Left facial numbness 05/03/2018   Headache 05/03/2018   Elevated troponin 05/03/2018   Hyperlipidemia associated with type 2 diabetes mellitus ([place]) 03/08/2018   Hypertension associated with stage 3 chronic kidney disease due to type 2 diabetes mellitus ([place]) 03/08/2018   Vitamin D deficiency 12/21/2017   Generalized anxiety disorder 08/07/2017   Diabetic peripheral neuropathy ([place]) 06/21/2017   History of asthma 06/21/2017   Steatosis of liver 06/21/2017   Microalbuminuric diabetic nephropathy ([place]) 06/01/2017   Raised TSH level 06/01/2017   Thrombocytosis 06/01/2017   Morbid obesity with BMI of 40.0-44.9, adult ([place]) 05/30/2017   Allergic rhinitis 05/30/2017   PAF (paroxysmal atrial fibrillation) ([place]) 05/25/2017   Sepsis ([place]) 05/03/2017    Class: Present on Admission   Acute renal failure superimposed on stage 3 chronic kidney disease ([place]) 05/03/2017    Class: Stage 3   Neck pain    Acidosis, metabolic    Hyponatremia    Type II diabetes mellitus with renal manifestations ([place]) 04/30/2017   Normocytic anemia 04/30/2017   Staphylococcus aureus bacteremia 03/15/2016   Streptococcal bacteremia 03/15/2016   Tachycardia 03/14/2016   Fever 03/14/2016   Bad headache 03/14/2016   Morbid obesity ([place])    Diabetes ([place]) 01/01/2014    Past Surgical History:  Procedure Laterality Date   BARIATRIC SURGERY     BREAST REDUCTION SURGERY  03/21/2017   CARDIAC CATHETERIZATION  01/06/2016  CARDIAC CATHETERIZATION N/A 01/06/2016   Procedure: Left Heart Cath and Coronary Angiography;  Surgeon: Jacqueline Hampshire, MD;  Location: [facility];  Service: Cardiovascular;  Laterality: N/A;   CESAREAN SECTION  08/2014     OB History     Gravida  1   Para      Term      Preterm      AB      Living         SAB      IAB      Ectopic      Multiple      Live Births              Family History  Problem Relation Age of Onset   Diabetes Mother     Hypertension Mother    Thyroid disease Mother    Kidney disease Maternal Grandmother    Diabetes Maternal Grandmother    Heart attack Other     Social History   Tobacco Use   Smoking status: Never   Smokeless tobacco: Never  Vaping Use   Vaping Use: Never used  Substance Use Topics   Alcohol use: No   Drug use: No    Home Medications Prior to Admission medications   Medication Sig Start Date End Date Taking? Authorizing Provider  oseltamivir (TAMIFLU) 75 MG capsule Take 1 capsule (75 mg total) by mouth every 12 (twelve) hours for 5 days. 05/17/21 05/22/21 Yes Jacqueline Eleazer, DO  amLODipine (NORVASC) 10 MG tablet Take 1 tablet (10 mg total) by mouth daily. 07/08/19   Jacqueline Sells, MD  aspirin 81 MG EC tablet aspirin 81 mg tablet,delayed release    [provider]  bumetanide (BUMEX) 1 MG tablet Take 1 mg by mouth every morning. 01/12/21   [provider]  calcitRIOL (ROCALTROL) 0.25 MCG capsule calcitriol 0.25 mcg capsule  TAKE 1 CAPSULE BY MOUTH EVERY DAY    [provider]  carvedilol (COREG) 25 MG tablet Take 1 tablet (25 mg total) by mouth 2 (two) times daily with a meal. 05/06/18   Jacqueline Deutscher, MD  cephALEXin (KEFLEX) 500 MG capsule Take 500 mg by mouth 2 (two) times daily. 01/12/21   [provider]  cyanocobalamin 1000 MCG tablet cyanocobalamin (vit B-12) 1,000 mcg tablet  Take 1 tablet every day by oral route.    [provider]  diclofenac Sodium (VOLTAREN) 1 % GEL Apply 2 g topically 4 (four) times daily as needed (pain).     [provider]  folic acid (FOLVITE) 1 MG tablet folic acid 1 mg tablet    [provider]  furosemide (LASIX) 40 MG tablet Take 40 mg by mouth daily as needed for fluid or edema. 06/23/19   [provider]  gabapentin (NEURONTIN) 100 MG capsule Take 100 mg by mouth in the morning.    [provider]  hydrALAZINE (APRESOLINE) 10 MG tablet Take by mouth.     [provider]  hydrALAZINE (APRESOLINE) 50 MG tablet Take 1 tablet (50 mg total) by mouth 3 (three) times daily. 07/07/19   Jacqueline Sells, MD  HYDROcodone-acetaminophen (NORCO/VICODIN) 5-325 MG tablet hydrocodone 5 mg-acetaminophen 325 mg tablet    [provider]  methocarbamol (ROBAXIN) 500 MG tablet methocarbamol 500 mg tablet    [provider]  Misc Natural Products (AIRBORNE ELDERBERRY) CHEW Chew 2 tablets by mouth daily.    [provider]  mupirocin ointment (BACTROBAN) 2 % mupirocin  2 % topical ointment  APPLY TO AFFECTED AREA 3 TIMES A DAY    [provider]  sertraline (ZOLOFT) 100 MG tablet Take 100 mg by mouth at bedtime. 04/30/19   [provider]  Vitamin D, Ergocalciferol, (DRISDOL) 1.25 MG (50000 UNIT) CAPS capsule Take 50,000 Units by mouth every Thursday. 12/23/19   [provider]    Allergies    Patient has no known allergies.  Review of Systems   Review of Systems  Constitutional:  Positive for fatigue and fever. Negative for chills.  HENT:  Positive for congestion. Negative for ear pain and sore throat.   Eyes:  Negative for pain and visual disturbance.  Respiratory:  Positive for cough. Negative for shortness of breath.   Cardiovascular:  Negative for chest pain and palpitations.  Gastrointestinal:  Negative for abdominal pain and vomiting.  Genitourinary:  Negative for dysuria and hematuria.  Musculoskeletal:  Negative for arthralgias and back pain.  Skin:  Negative for color change and rash.  Neurological:  Negative for seizures and syncope.  All other systems reviewed and are negative.  Physical Exam Updated Vital Signs BP (!) 156/87   Pulse 80   Temp 98.9 F (37.2 C) (Oral)   Resp (!) 9   Ht 5\' 4"  (1.626 m)   Wt 86.2 kg   LMP 04/16/2021   SpO2 100%   BMI 32.61 kg/m   Physical Exam Vitals and nursing note reviewed.  Constitutional:      General: She is not in acute distress.     Appearance: She is well-developed. She is not ill-appearing.  HENT:     Head: Normocephalic and atraumatic.  Eyes:     Extraocular Movements: Extraocular movements intact.     Conjunctiva/sclera: Conjunctivae normal.     Pupils: Pupils are equal, round, and reactive to light.  Cardiovascular:     Rate and Rhythm: Normal rate and regular rhythm.     Heart sounds: Normal heart sounds. No murmur heard. Pulmonary:     Effort: Pulmonary effort is normal. No respiratory distress.     Breath sounds: Normal breath sounds.  Abdominal:     Palpations: Abdomen is soft.     Tenderness: There is no abdominal tenderness.  Musculoskeletal:        General: Normal range of motion.     Cervical back: Normal range of motion and neck supple.  Skin:    General: Skin is warm and dry.  Neurological:     Mental Status: She is alert.    ED Results / Procedures / Treatments   Labs (all labs ordered are listed, but only abnormal results are displayed) Labs Reviewed  RESP PANEL BY RT-PCR (FLU A&B, COVID) ARPGX2 - Abnormal; Notable for the following components:      Result Value   Influenza A by PCR POSITIVE (*)    All other components within normal limits  BASIC METABOLIC PANEL - Abnormal; Notable for the following components:   Chloride 112 (*)    CO2 19 (*)    Glucose, Bld 158 (*)    BUN 26 (*)    Creatinine, Ser 3.24 (*)    Calcium 7.7 (*)    GFR, Estimated 18 (*)    All other components within normal limits  CBC - Abnormal; Notable for the following components:   RBC 2.90 (*)    Hemoglobin 8.8 (*)    HCT 27.9 (*)    All other components within normal limits  I-STAT BETA  HCG BLOOD, ED ([facility], [facility], [facility] ONLY)    EKG None  Radiology DG Chest 2 View  Result Date: 05/17/2021 CLINICAL DATA:  Chest pain EXAM: CHEST - 2 VIEW COMPARISON:  08/17/2020 chest radiograph. FINDINGS: Stable cardiomediastinal silhouette with normal heart size. No pneumothorax. No pleural effusion. Lungs appear clear,  with no acute consolidative airspace disease and no pulmonary edema. IMPRESSION: No active cardiopulmonary disease. Electronically Signed   By: Jacqueline [name] M.D.   On: 05/17/2021 08:22    Procedures Procedures   Medications Ordered in ED Medications  ibuprofen (ADVIL) tablet 800 mg (800 mg Oral Given 05/17/21 0808)    ED Course  I have reviewed the triage vital signs and the nursing notes.  Pertinent labs & imaging results that were available during my care of the patient were reviewed by me and considered in my medical decision making (see chart for details).    MDM Rules/Calculators/A&P                           Sheyann Kazanjian is here for fever, body aches, congestion, cough.  Febrile upon arrival but otherwise normal vitals.  Positive for influenza A.  No significant anemia, electrolyte abnormality, kidney injury otherwise.  Chest x-ray negative for pneumonia.  Overall suspect symptoms secondary to influenza A.  She would like to try Tamiflu.  Educated about Tamiflu and side effects.  Recommend continued use of Tylenol and hydration.  Understands return precautions and discharged in the ED in good condition.  This chart was dictated using voice recognition software.  Despite best efforts to proofread,  errors can occur which can change the documentation meaning.   Final Clinical Impression(s) / ED Diagnoses Final diagnoses:  Influenza A    Rx / DC Orders ED Discharge Orders          Ordered    oseltamivir (TAMIFLU) 75 MG capsule  Every 12 hours        05/17/21 Tama, [place], DO 05/17/21 1137

## 2021-05-17 NOTE — ED Triage Notes (Signed)
Pt. Stated, Last night I started having body aches especially my neck. Im having chest apin and SOB and this is the way I felt when I had congestive heart failure back in 2018.

## 2021-05-17 NOTE — ED Triage Notes (Signed)
Pt. Took Tylenol 1000mg  at 530 this morning.

## 2021-05-17 NOTE — ED Notes (Signed)
Discharge instructions reviewed with patient. Patient verbalized understanding of instructions. Follow-up care and medications were reviewed. Patient ambulatory with steady gait. VSS upon discharge.  ?

## 2021-08-27 ENCOUNTER — Other Ambulatory Visit: Payer: Self-pay

## 2021-08-27 ENCOUNTER — Emergency Department (HOSPITAL_COMMUNITY): Payer: BC Managed Care – PPO

## 2021-08-27 ENCOUNTER — Emergency Department (HOSPITAL_COMMUNITY)
Admission: EM | Admit: 2021-08-27 | Discharge: 2021-08-27 | Disposition: A | Discharge status: Home / Self Care | Payer: BC Managed Care – PPO | Attending: Emergency Medicine | Admitting: Emergency Medicine

## 2021-08-27 DIAGNOSIS — R109 Unspecified abdominal pain: Secondary | ICD-10-CM | POA: Diagnosis not present

## 2021-08-27 DIAGNOSIS — Z79899 Other long term (current) drug therapy: Secondary | ICD-10-CM | POA: Insufficient documentation

## 2021-08-27 DIAGNOSIS — Z7982 Long term (current) use of aspirin: Secondary | ICD-10-CM | POA: Diagnosis not present

## 2021-08-27 DIAGNOSIS — R079 Chest pain, unspecified: Secondary | ICD-10-CM | POA: Insufficient documentation

## 2021-08-27 DIAGNOSIS — Z9889 Other specified postprocedural states: Secondary | ICD-10-CM | POA: Diagnosis not present

## 2021-08-27 DIAGNOSIS — N390 Urinary tract infection, site not specified: Secondary | ICD-10-CM | POA: Insufficient documentation

## 2021-08-27 DIAGNOSIS — D3502 Benign neoplasm of left adrenal gland: Secondary | ICD-10-CM | POA: Diagnosis not present

## 2021-08-27 DIAGNOSIS — M549 Dorsalgia, unspecified: Secondary | ICD-10-CM | POA: Diagnosis not present

## 2021-08-27 DIAGNOSIS — R000 Tachycardia, unspecified: Secondary | ICD-10-CM | POA: Diagnosis not present

## 2021-08-27 LAB — COMPREHENSIVE METABOLIC PANEL
ALT: 44 U/L (ref 0–44)
AST: 43 U/L — ABNORMAL HIGH (ref 15–41)
Albumin: 2.6 g/dL — ABNORMAL LOW (ref 3.5–5.0)
Alkaline Phosphatase: 86 U/L (ref 38–126)
Anion gap: 10 (ref 5–15)
BUN: 47 mg/dL — ABNORMAL HIGH (ref 6–20)
CO2: 15 mmol/L — ABNORMAL LOW (ref 22–32)
Calcium: 6.8 mg/dL — ABNORMAL LOW (ref 8.9–10.3)
Chloride: 117 mmol/L — ABNORMAL HIGH (ref 98–111)
Creatinine, Ser: 3.54 mg/dL — ABNORMAL HIGH (ref 0.44–1.00)
GFR, Estimated: 16 mL/min — ABNORMAL LOW (ref >60)
Glucose, Bld: 102 mg/dL — ABNORMAL HIGH (ref 70–99)
Potassium: 4.2 mmol/L (ref 3.5–5.1)
Sodium: 142 mmol/L (ref 135–145)
Total Bilirubin: 0.5 mg/dL (ref 0.3–1.2)
Total Protein: 6 g/dL — ABNORMAL LOW (ref 6.5–8.1)

## 2021-08-27 LAB — URINALYSIS, ROUTINE W REFLEX MICROSCOPIC
Bilirubin Urine: NEGATIVE
Glucose, UA: NEGATIVE mg/dL
Hgb urine dipstick: NEGATIVE
Ketones, ur: NEGATIVE mg/dL
Nitrite: POSITIVE — AB
Protein, ur: >=300 mg/dL — AB
Specific Gravity, Urine: 1.012 (ref 1.005–1.030)
WBC, UA: >50 WBC/hpf — ABNORMAL HIGH (ref 0–5)
pH: 5 (ref 5.0–8.0)

## 2021-08-27 LAB — CBC
HCT: 25.3 % — ABNORMAL LOW (ref 36.0–46.0)
Hemoglobin: 8.2 g/dL — ABNORMAL LOW (ref 12.0–15.0)
MCH: 31.7 pg (ref 26.0–34.0)
MCHC: 32.4 g/dL (ref 30.0–36.0)
MCV: 97.7 fL (ref 80.0–100.0)
Platelets: 261 10*3/uL (ref 150–400)
RBC: 2.59 MIL/uL — ABNORMAL LOW (ref 3.87–5.11)
RDW: 13.3 % (ref 11.5–15.5)
WBC: 8.1 10*3/uL (ref 4.0–10.5)
nRBC: 0 % (ref 0.0–0.2)

## 2021-08-27 LAB — TROPONIN I (HIGH SENSITIVITY)
Troponin I (High Sensitivity): 9 ng/L (ref <18)
Troponin I (High Sensitivity): 8 ng/L (ref <18)

## 2021-08-27 LAB — I-STAT BETA HCG BLOOD, ED (MC, WL, AP ONLY): I-stat hCG, quantitative: <5 m[IU]/mL (ref <5)

## 2021-08-27 LAB — LIPASE, BLOOD: Lipase: 40 U/L (ref 11–51)

## 2021-08-27 MED ORDER — HYDROCODONE-ACETAMINOPHEN 5-325 MG PO TABS: 1.0000 | ORAL_TABLET | ORAL | 0 refills | Status: DC | PRN

## 2021-08-27 MED ORDER — ONDANSETRON HCL 4 MG/2ML IJ SOLN
4.0000 mg | Freq: Once | INTRAMUSCULAR | Status: AC
  Administered 2021-08-27: 4 mg via INTRAVENOUS
  Filled 2021-08-27: qty 2

## 2021-08-27 MED ORDER — CEPHALEXIN 500 MG PO CAPS: 500.0000 mg | ORAL_CAPSULE | Freq: Four times a day (QID) | ORAL | 0 refills | Status: AC

## 2021-08-27 MED ORDER — HYDROMORPHONE HCL 1 MG/ML IJ SOLN
1.0000 mg | Freq: Once | INTRAMUSCULAR | Status: AC
  Administered 2021-08-27: 1 mg via INTRAVENOUS
  Filled 2021-08-27: qty 1

## 2021-08-27 NOTE — ED Notes (Signed)
Pt verbalized understanding of d/c instructions, meds and followup care. Denies questions. VSS, no distress noted. Steady gait to exit with all belongings.  ?

## 2021-08-27 NOTE — ED Provider Notes (Signed)
[facility] EMERGENCY DEPARTMENT Provider Note   CSN: [id number] Arrival date & time: 08/27/21  4854     History  Chief Complaint  Patient presents with   Chest Pain   Abdominal Pain   Back Pain    Jacqueline [name] is a 41 y.o. female.  The history is provided by the patient. No language interpreter was used.  Abdominal Pain Pain location:  L flank Pain quality: aching   Pain radiates to:  Does not radiate Pain severity:  Moderate Onset quality:  Gradual Duration:  1 day Timing:  Constant Progression:  Worsening Chronicity:  New Context: not recent illness   Relieved by:  Nothing Worsened by:  Nothing Ineffective treatments:  None tried Associated symptoms: no nausea   Back Pain     Home Medications Prior to Admission medications   Medication Sig Start Date End Date Taking? Authorizing Provider  amLODipine (NORVASC) 10 MG tablet Take 1 tablet (10 mg total) by mouth daily. 07/08/19   Nita Sells, MD  aspirin 81 MG EC tablet aspirin 81 mg tablet,delayed release    [provider]  bumetanide (BUMEX) 1 MG tablet Take 1 mg by mouth every morning. 01/12/21   [provider]  calcitRIOL (ROCALTROL) 0.25 MCG capsule calcitriol 0.25 mcg capsule  TAKE 1 CAPSULE BY MOUTH EVERY DAY    [provider]  carvedilol (COREG) 25 MG tablet Take 1 tablet (25 mg total) by mouth 2 (two) times daily with a meal. 05/06/18   Lady Deutscher, MD  cephALEXin (KEFLEX) 500 MG capsule Take 500 mg by mouth 2 (two) times daily. 01/12/21   [provider]  cyanocobalamin 1000 MCG tablet cyanocobalamin (vit B-12) 1,000 mcg tablet  Take 1 tablet every day by oral route.    [provider]  diclofenac Sodium (VOLTAREN) 1 % GEL Apply 2 g topically 4 (four) times daily as needed (pain).     [provider]  folic acid (FOLVITE) 1 MG tablet folic acid 1 mg tablet    [provider]  furosemide (LASIX) 40 MG tablet  Take 40 mg by mouth daily as needed for fluid or edema. 06/23/19   [provider]  gabapentin (NEURONTIN) 100 MG capsule Take 100 mg by mouth in the morning.    [provider]  hydrALAZINE (APRESOLINE) 10 MG tablet Take by mouth.    [provider]  hydrALAZINE (APRESOLINE) 50 MG tablet Take 1 tablet (50 mg total) by mouth 3 (three) times daily. 07/07/19   Nita Sells, MD  HYDROcodone-acetaminophen (NORCO/VICODIN) 5-325 MG tablet hydrocodone 5 mg-acetaminophen 325 mg tablet    [provider]  methocarbamol (ROBAXIN) 500 MG tablet methocarbamol 500 mg tablet    [provider]  Misc Natural Products (AIRBORNE ELDERBERRY) CHEW Chew 2 tablets by mouth daily.    [provider]  mupirocin ointment (BACTROBAN) 2 % mupirocin 2 % topical ointment  APPLY TO AFFECTED AREA 3 TIMES A DAY    [provider]  sertraline (ZOLOFT) 100 MG tablet Take 100 mg by mouth at bedtime. 04/30/19   [provider]  Vitamin D, Ergocalciferol, (DRISDOL) 1.25 MG (50000 UNIT) CAPS capsule Take 50,000 Units by mouth every Thursday. 12/23/19   [provider]      Allergies    Patient has no known allergies.    Review of Systems   Review of Systems  Gastrointestinal:  Negative for nausea.  All other systems reviewed and are negative.  Physical Exam Updated Vital Signs BP (!) 178/90 (BP Location: Left Arm)    Pulse (!) 110    Temp 99.8 F (37.7 C) (Oral)    Resp 14    LMP 08/18/2021    SpO2 100%  Physical Exam Vitals and nursing note reviewed.  Constitutional:      Appearance: She is well-developed.  HENT:     Head: Normocephalic.  Cardiovascular:     Rate and Rhythm: Normal rate.     Heart sounds: Normal heart sounds.  Pulmonary:     Effort: Pulmonary effort is normal.     Breath sounds: Normal breath sounds.  Abdominal:     General: There is no distension.     Palpations: Abdomen is soft.  Musculoskeletal:         General: Normal range of motion.     Cervical back: Normal range of motion.  Skin:    General: Skin is warm.  Neurological:     General: No focal deficit present.     Mental Status: She is alert and oriented to person, place, and time.    ED Results / Procedures / Treatments   Labs (all labs ordered are listed, but only abnormal results are displayed) Labs Reviewed  CBC - Abnormal; Notable for the following components:      Result Value   RBC 2.59 (*)    Hemoglobin 8.2 (*)    HCT 25.3 (*)    All other components within normal limits  URINALYSIS, ROUTINE W REFLEX MICROSCOPIC - Abnormal; Notable for the following components:   APPearance CLOUDY (*)    Protein, ur >=300 (*)    Nitrite POSITIVE (*)    Leukocytes,Ua SMALL (*)    WBC, UA >50 (*)    Bacteria, UA MANY (*)    All other components within normal limits  LIPASE, BLOOD  COMPREHENSIVE METABOLIC PANEL  I-STAT BETA HCG BLOOD, ED ([facility], [facility], [facility] ONLY)  TROPONIN I (HIGH SENSITIVITY)    EKG None  Radiology No results found.  Procedures Procedures    Medications Ordered in ED Medications - No data to display  ED Course/ Medical Decision Making/ A&P                           Medical Decision Making Problems Addressed: Urinary tract infection without hematuria, site unspecified: acute illness or injury    Details: Pt complains of flank pain and trouble controlling urine  Amount and/or Complexity of Data Reviewed Labs: ordered. Decision-making details documented in ED Course.    Details: Ua shows greater than 50 wbc's Radiology: ordered and independent interpretation performed. Decision-making details documented in ED Course.    Details: Left adrenal adenoma,  no kidney stone  Risk OTC drugs. Prescription drug management.           Final Clinical Impression(s) / ED Diagnoses Final diagnoses:  Urinary tract infection without hematuria, site unspecified    Rx / DC Orders ED Discharge Orders           Ordered    cephALEXin (KEFLEX) 500 MG capsule  4 times daily        08/27/21 1202    HYDROcodone-acetaminophen (NORCO/VICODIN) 5-325 MG tablet  Every 4 hours PRN        08/27/21 1202           An After Visit Summary was printed and given to the patient.    Fransico Meadow, [place] 09/01/21  1827    Valarie Merino, MD 09/02/21 1800

## 2021-08-27 NOTE — ED Triage Notes (Signed)
Pt. Stated, ive been having trouble pushing out my urine and when I poop it comes out and I dont know it. Ive not been able to eat and Im vomiting.

## 2021-08-31 DIAGNOSIS — F411 Generalized anxiety disorder: Secondary | ICD-10-CM | POA: Diagnosis not present

## 2021-08-31 DIAGNOSIS — Z1239 Encounter for other screening for malignant neoplasm of breast: Secondary | ICD-10-CM | POA: Diagnosis not present

## 2021-08-31 DIAGNOSIS — E785 Hyperlipidemia, unspecified: Secondary | ICD-10-CM | POA: Diagnosis not present

## 2021-08-31 DIAGNOSIS — Z0001 Encounter for general adult medical examination with abnormal findings: Secondary | ICD-10-CM | POA: Diagnosis not present

## 2021-08-31 DIAGNOSIS — E1121 Type 2 diabetes mellitus with diabetic nephropathy: Secondary | ICD-10-CM | POA: Diagnosis not present

## 2021-08-31 DIAGNOSIS — Z23 Encounter for immunization: Secondary | ICD-10-CM | POA: Diagnosis not present

## 2021-08-31 DIAGNOSIS — K760 Fatty (change of) liver, not elsewhere classified: Secondary | ICD-10-CM | POA: Diagnosis not present

## 2021-08-31 DIAGNOSIS — R8290 Unspecified abnormal findings in urine: Secondary | ICD-10-CM | POA: Diagnosis not present

## 2021-08-31 DIAGNOSIS — I10 Essential (primary) hypertension: Secondary | ICD-10-CM | POA: Diagnosis not present

## 2021-08-31 DIAGNOSIS — Z01419 Encounter for gynecological examination (general) (routine) without abnormal findings: Secondary | ICD-10-CM | POA: Diagnosis not present

## 2021-08-31 DIAGNOSIS — E1165 Type 2 diabetes mellitus with hyperglycemia: Secondary | ICD-10-CM | POA: Diagnosis not present

## 2021-09-14 ENCOUNTER — Other Ambulatory Visit: Payer: Self-pay | Admitting: Internal Medicine

## 2021-09-14 DIAGNOSIS — Z1231 Encounter for screening mammogram for malignant neoplasm of breast: Secondary | ICD-10-CM

## 2021-10-29 ENCOUNTER — Ambulatory Visit
Admission: RE | Admit: 2021-10-29 | Discharge: 2021-10-29 | Disposition: A | Discharge status: Home / Self Care | Payer: BC Managed Care – PPO | Source: Ambulatory Visit | Attending: Internal Medicine | Admitting: Internal Medicine

## 2021-10-29 DIAGNOSIS — Z1231 Encounter for screening mammogram for malignant neoplasm of breast: Secondary | ICD-10-CM | POA: Diagnosis not present

## 2021-11-01 ENCOUNTER — Other Ambulatory Visit: Payer: Self-pay | Admitting: Internal Medicine

## 2021-11-01 DIAGNOSIS — R928 Other abnormal and inconclusive findings on diagnostic imaging of breast: Secondary | ICD-10-CM

## 2021-11-10 ENCOUNTER — Other Ambulatory Visit: Payer: BC Managed Care – PPO

## 2021-11-26 ENCOUNTER — Ambulatory Visit
Admission: RE | Admit: 2021-11-26 | Discharge: 2021-11-26 | Disposition: A | Discharge status: Home / Self Care | Payer: BC Managed Care – PPO | Source: Ambulatory Visit | Attending: Internal Medicine | Admitting: Internal Medicine

## 2021-11-26 DIAGNOSIS — N6489 Other specified disorders of breast: Secondary | ICD-10-CM | POA: Diagnosis not present

## 2021-11-26 DIAGNOSIS — R928 Other abnormal and inconclusive findings on diagnostic imaging of breast: Secondary | ICD-10-CM | POA: Diagnosis not present

## 2022-02-10 ENCOUNTER — Telehealth: Payer: Self-pay

## 2022-02-10 NOTE — Patient Outreach (Signed)
  Care Management   Outreach Note  02/10/2022 Name: Jacqueline [name] MRN: [id number] DOB: Jun 24, 1981  Successful contact was made with the patient to discuss care coordination services. Patient is at work and requests a call back.  Follow Up Plan:  The care management team will reach out to the patient again over the next 5 days.   Daneen Schick, BSW, CDP Social Worker, Certified Dementia Practitioner Care Coordination [phone]

## 2022-02-11 ENCOUNTER — Ambulatory Visit: Payer: Self-pay

## 2022-02-11 NOTE — Patient Outreach (Signed)
  Care Coordination   Initial Visit Note   02/11/2022 Name: Jacqueline [name] MRN: [id number] DOB: May 07, 1981  Jacqueline [name] is a 41 y.o. year old female who sees Bakare, Deidre Ala B, MD for primary care. I spoke with  Jacqueline [name] by phone today  What matters to the patients health and wellness today?  Pt states doing well, needs to schedule diabetic eye exam   Goals Addressed               This Visit's Progress     Patient Stated     COMPLETED: Complete diabetic eye exam (pt-stated)        Care Coordination Interventions: Performed chart review to note diabetic eye exam overdue Discussed the patient has yet to have test performed this year but will plan to complete Instructed the patient to contact her primary care provider regarding test completion Collaboration with primary care provider regarding overdue eye exam        SDOH assessments and interventions completed:   Yes SDOH Interventions Today    Flowsheet Row Most Recent Value  SDOH Interventions   Food Insecurity Interventions Intervention Not Indicated  Housing Interventions Intervention Not Indicated  Transportation Interventions Intervention Not Indicated       Care Coordination Interventions Activated:  Yes Care Coordination Interventions:  Yes, provided  Follow up plan: No further intervention required.The patient will follow up with provider to scheduled diabetic eye exam.  Encounter Outcome:  Pt. Visit Completed  Daneen Schick, BSW, CDP Social Worker, Certified Dementia Practitioner Care Coordination [phone]

## 2022-02-11 NOTE — Patient Instructions (Signed)
Visit Information  Thank you for taking time to visit with me today. Please don't hesitate to contact me if I can be of assistance to you.   Following are the goals we discussed today:   Goals Addressed               This Visit's Progress     Patient Stated     COMPLETED: Complete diabetic eye exam (pt-stated)        Care Coordination Interventions: Performed chart review to note diabetic eye exam overdue Discussed the patient has yet to have test performed this year but will plan to complete Instructed the patient to contact her primary care provider regarding test completion Collaboration with primary care provider regarding overdue eye exam        Please call the care guide team at [phone] if you need to schedule an appointment with me.  If you are experiencing a Mental Health or [place] or need someone to talk to, please call [phone]-TALK (toll free, 24 hour hotline)  Patient verbalizes understanding of instructions and care plan provided today and agrees to view in [place]. Active MyChart status and patient understanding of how to access instructions and care plan via MyChart confirmed with patient.     No further follow up required: Please contact me as needed.  Daneen Schick, BSW, CDP Social Worker, Certified Dementia Practitioner Care Coordination [phone]

## 2022-03-05 ENCOUNTER — Other Ambulatory Visit: Payer: Self-pay

## 2022-03-05 ENCOUNTER — Encounter (HOSPITAL_COMMUNITY): Payer: Self-pay | Admitting: Emergency Medicine

## 2022-03-05 ENCOUNTER — Emergency Department (HOSPITAL_COMMUNITY)
Admission: EM | Admit: 2022-03-05 | Discharge: 2022-03-06 | Discharge status: Against Medical Advice | Payer: BC Managed Care – PPO | Attending: Emergency Medicine | Admitting: Emergency Medicine

## 2022-03-05 ENCOUNTER — Emergency Department (HOSPITAL_COMMUNITY): Payer: BC Managed Care – PPO

## 2022-03-05 DIAGNOSIS — R1110 Vomiting, unspecified: Secondary | ICD-10-CM | POA: Insufficient documentation

## 2022-03-05 DIAGNOSIS — R42 Dizziness and giddiness: Secondary | ICD-10-CM | POA: Diagnosis not present

## 2022-03-05 DIAGNOSIS — R0602 Shortness of breath: Secondary | ICD-10-CM | POA: Diagnosis not present

## 2022-03-05 DIAGNOSIS — Z5321 Procedure and treatment not carried out due to patient leaving prior to being seen by health care provider: Secondary | ICD-10-CM | POA: Insufficient documentation

## 2022-03-05 DIAGNOSIS — R079 Chest pain, unspecified: Secondary | ICD-10-CM | POA: Insufficient documentation

## 2022-03-05 LAB — CBC
HCT: 25.3 % — ABNORMAL LOW (ref 36.0–46.0)
Hemoglobin: 8.4 g/dL — ABNORMAL LOW (ref 12.0–15.0)
MCH: 30.8 pg (ref 26.0–34.0)
MCHC: 33.2 g/dL (ref 30.0–36.0)
MCV: 92.7 fL (ref 80.0–100.0)
Platelets: 250 10*3/uL (ref 150–400)
RBC: 2.73 MIL/uL — ABNORMAL LOW (ref 3.87–5.11)
RDW: 15.3 % (ref 11.5–15.5)
WBC: 8.6 10*3/uL (ref 4.0–10.5)
nRBC: 0 % (ref 0.0–0.2)

## 2022-03-05 LAB — I-STAT BETA HCG BLOOD, ED (MC, WL, AP ONLY): I-stat hCG, quantitative: <5 m[IU]/mL (ref <5)

## 2022-03-05 NOTE — ED Triage Notes (Signed)
Patient reports central chest pain with mild SOB , emesis and lightheaded this evening , no fever or cough .

## 2022-03-06 LAB — BASIC METABOLIC PANEL
Anion gap: 6 (ref 5–15)
BUN: 49 mg/dL — ABNORMAL HIGH (ref 6–20)
CO2: 18 mmol/L — ABNORMAL LOW (ref 22–32)
Calcium: 7.9 mg/dL — ABNORMAL LOW (ref 8.9–10.3)
Chloride: 117 mmol/L — ABNORMAL HIGH (ref 98–111)
Creatinine, Ser: 4.21 mg/dL — ABNORMAL HIGH (ref 0.44–1.00)
GFR, Estimated: 13 mL/min — ABNORMAL LOW (ref >60)
Glucose, Bld: 117 mg/dL — ABNORMAL HIGH (ref 70–99)
Potassium: 4.4 mmol/L (ref 3.5–5.1)
Sodium: 141 mmol/L (ref 135–145)

## 2022-03-06 LAB — TROPONIN I (HIGH SENSITIVITY): Troponin I (High Sensitivity): 10 ng/L (ref <18)

## 2022-03-06 NOTE — ED Notes (Signed)
Pt stated she was leaving due to the wait

## 2022-03-13 ENCOUNTER — Other Ambulatory Visit: Payer: Self-pay

## 2022-03-13 ENCOUNTER — Encounter (HOSPITAL_COMMUNITY): Payer: Self-pay

## 2022-03-13 ENCOUNTER — Emergency Department (HOSPITAL_COMMUNITY): Payer: BC Managed Care – PPO

## 2022-03-13 ENCOUNTER — Emergency Department (HOSPITAL_COMMUNITY)
Admission: EM | Admit: 2022-03-13 | Discharge: 2022-03-13 | Disposition: A | Discharge status: Home / Self Care | Payer: BC Managed Care – PPO | Attending: Emergency Medicine | Admitting: Emergency Medicine

## 2022-03-13 DIAGNOSIS — Z7982 Long term (current) use of aspirin: Secondary | ICD-10-CM | POA: Insufficient documentation

## 2022-03-13 DIAGNOSIS — R000 Tachycardia, unspecified: Secondary | ICD-10-CM | POA: Diagnosis not present

## 2022-03-13 DIAGNOSIS — Z79899 Other long term (current) drug therapy: Secondary | ICD-10-CM | POA: Diagnosis not present

## 2022-03-13 DIAGNOSIS — E119 Type 2 diabetes mellitus without complications: Secondary | ICD-10-CM | POA: Diagnosis not present

## 2022-03-13 DIAGNOSIS — M7989 Other specified soft tissue disorders: Secondary | ICD-10-CM | POA: Diagnosis not present

## 2022-03-13 DIAGNOSIS — I10 Essential (primary) hypertension: Secondary | ICD-10-CM | POA: Diagnosis not present

## 2022-03-13 DIAGNOSIS — M79671 Pain in right foot: Secondary | ICD-10-CM | POA: Diagnosis not present

## 2022-03-13 LAB — CBC WITH DIFFERENTIAL/PLATELET
Abs Immature Granulocytes: 0.02 10*3/uL (ref 0.00–0.07)
Basophils Absolute: 0.1 10*3/uL (ref 0.0–0.1)
Basophils Relative: 1 %
Eosinophils Absolute: 0.2 10*3/uL (ref 0.0–0.5)
Eosinophils Relative: 2 %
HCT: 27.1 % — ABNORMAL LOW (ref 36.0–46.0)
Hemoglobin: 8.7 g/dL — ABNORMAL LOW (ref 12.0–15.0)
Immature Granulocytes: 0 %
Lymphocytes Relative: 27 %
Lymphs Abs: 2.5 10*3/uL (ref 0.7–4.0)
MCH: 30.4 pg (ref 26.0–34.0)
MCHC: 32.1 g/dL (ref 30.0–36.0)
MCV: 94.8 fL (ref 80.0–100.0)
Monocytes Absolute: 0.6 10*3/uL (ref 0.1–1.0)
Monocytes Relative: 6 %
Neutro Abs: 6 10*3/uL (ref 1.7–7.7)
Neutrophils Relative %: 64 %
Platelets: 229 10*3/uL (ref 150–400)
RBC: 2.86 MIL/uL — ABNORMAL LOW (ref 3.87–5.11)
RDW: 14.2 % (ref 11.5–15.5)
WBC: 9.4 10*3/uL (ref 4.0–10.5)
nRBC: 0 % (ref 0.0–0.2)

## 2022-03-13 LAB — COMPREHENSIVE METABOLIC PANEL
ALT: 75 U/L — ABNORMAL HIGH (ref 0–44)
AST: 65 U/L — ABNORMAL HIGH (ref 15–41)
Albumin: 3.3 g/dL — ABNORMAL LOW (ref 3.5–5.0)
Alkaline Phosphatase: 96 U/L (ref 38–126)
Anion gap: 9 (ref 5–15)
BUN: 55 mg/dL — ABNORMAL HIGH (ref 6–20)
CO2: 15 mmol/L — ABNORMAL LOW (ref 22–32)
Calcium: 7.8 mg/dL — ABNORMAL LOW (ref 8.9–10.3)
Chloride: 115 mmol/L — ABNORMAL HIGH (ref 98–111)
Creatinine, Ser: 4.1 mg/dL — ABNORMAL HIGH (ref 0.44–1.00)
GFR, Estimated: 13 mL/min — ABNORMAL LOW (ref >60)
Glucose, Bld: 102 mg/dL — ABNORMAL HIGH (ref 70–99)
Potassium: 4.5 mmol/L (ref 3.5–5.1)
Sodium: 139 mmol/L (ref 135–145)
Total Bilirubin: 0.8 mg/dL (ref 0.3–1.2)
Total Protein: 7.5 g/dL (ref 6.5–8.1)

## 2022-03-13 LAB — LACTIC ACID, PLASMA: Lactic Acid, Venous: 0.9 mmol/L (ref 0.5–1.9)

## 2022-03-13 LAB — PROTIME-INR
INR: 1 (ref 0.8–1.2)
Prothrombin Time: 13.2 seconds (ref 11.4–15.2)

## 2022-03-13 MED ORDER — DOXYCYCLINE HYCLATE 100 MG PO CAPS: 100.0000 mg | ORAL_CAPSULE | Freq: Two times a day (BID) | ORAL | 0 refills | Status: AC

## 2022-03-13 MED ORDER — OXYCODONE-ACETAMINOPHEN 5-325 MG PO TABS: 1.0000 | ORAL_TABLET | Freq: Four times a day (QID) | ORAL | 0 refills | Status: AC | PRN

## 2022-03-13 MED ORDER — OXYCODONE-ACETAMINOPHEN 5-325 MG PO TABS
1.0000 | ORAL_TABLET | Freq: Once | ORAL | Status: AC
  Administered 2022-03-13: 1 via ORAL
  Filled 2022-03-13: qty 1

## 2022-03-13 NOTE — ED Provider Notes (Signed)
[facility] EMERGENCY DEPARTMENT Provider Note   CSN: [id number] Arrival date & time: 03/13/22  1231     History  No chief complaint on file.   Jacqueline [name] is a 41 y.o. female.  41 year old female with a past medical history of DM (diet control), HTN presents to the ED with a chief complaint of right foot pain for the past couple of days.  Patient reports she has been previously treated at [facility] for right foot pain after a fracture.  Reports she does not have any feeling in her right foot.  However, a few days ago she began to experience some sharp shooting pain to the foot, exacerbated with any ambulation along with applying pressure to it.  She reports she noted a bulge under this area which has now gotten bigger.  She reports the pain now travels from her midfoot onto her ankle and leg.  She has taken some Tylenol to help with pain control but there has not been any improvement in symptoms.  She also endorses some fever and chills, with a temperature of 102 while at home.  Addition endorsing some nausea but no abdominal pain.  Denies any trauma. She not have any feeling on the right foot, she is unsure whether she had a cut or stepped in something.    The history is provided by the patient and medical records.       Home Medications Prior to Admission medications   Medication Sig Start Date End Date Taking? Authorizing Provider  doxycycline (VIBRAMYCIN) 100 MG capsule Take 1 capsule (100 mg total) by mouth 2 (two) times daily for 7 days. 03/13/22 03/20/22 Yes Ferdinando Lodge, Beverley Fiedler, PA-C  oxyCODONE-acetaminophen (PERCOCET/ROXICET) 5-325 MG tablet Take 1 tablet by mouth every 6 (six) hours as needed for up to 3 days for severe pain. 03/13/22 03/16/22 Yes Shalay Carder, PA-C  amLODipine (NORVASC) 10 MG tablet Take 1 tablet (10 mg total) by mouth daily. 07/08/19   Nita Sells, MD  aspirin 81 MG EC tablet aspirin 81 mg tablet,delayed release    [provider]  bumetanide (BUMEX) 1 MG tablet Take 1 mg by mouth every morning. 01/12/21   [provider]  calcitRIOL (ROCALTROL) 0.25 MCG capsule calcitriol 0.25 mcg capsule  TAKE 1 CAPSULE BY MOUTH EVERY DAY    [provider]  carvedilol (COREG) 25 MG tablet Take 1 tablet (25 mg total) by mouth 2 (two) times daily with a meal. 05/06/18   Lady Deutscher, MD  cyanocobalamin 1000 MCG tablet cyanocobalamin (vit B-12) 1,000 mcg tablet  Take 1 tablet every day by oral route.    [provider]  diclofenac Sodium (VOLTAREN) 1 % GEL Apply 2 g topically 4 (four) times daily as needed (pain).     [provider]  folic acid (FOLVITE) 1 MG tablet folic acid 1 mg tablet    [provider]  furosemide (LASIX) 40 MG tablet Take 40 mg by mouth daily as needed for fluid or edema. 06/23/19   [provider]  gabapentin (NEURONTIN) 100 MG capsule Take 100 mg by mouth in the morning.    [provider]  hydrALAZINE (APRESOLINE) 10 MG tablet Take by mouth.    [provider]  hydrALAZINE (APRESOLINE) 50 MG tablet Take 1 tablet (50 mg total) by mouth 3 (three) times daily. 07/07/19   Nita Sells, MD  methocarbamol (ROBAXIN) 500 MG tablet methocarbamol 500 mg tablet    [provider]  Misc Natural Products (AIRBORNE ELDERBERRY) CHEW Chew 2 tablets by mouth daily.    [provider]  mupirocin ointment (BACTROBAN) 2 % mupirocin 2 % topical ointment  APPLY TO AFFECTED AREA 3 TIMES A DAY    [provider]  sertraline (ZOLOFT) 100 MG tablet Take 100 mg by mouth at bedtime. 04/30/19   [provider]  Vitamin D, Ergocalciferol, (DRISDOL) 1.25 MG (50000 UNIT) CAPS capsule Take 50,000 Units by mouth every Thursday. 12/23/19   [provider]      Allergies    Patient has no known allergies.    Review of Systems   Review of Systems  Constitutional:  Positive for chills and fever.  HENT:   Negative for sore throat.   Respiratory:  Negative for shortness of breath.   Cardiovascular:  Negative for chest pain.  Gastrointestinal:  Positive for nausea. Negative for abdominal pain.  Genitourinary:  Negative for flank pain.  Musculoskeletal:  Positive for arthralgias.  Neurological:  Negative for headaches.  All other systems reviewed and are negative.   Physical Exam Updated Vital Signs BP (!) 171/91   Pulse 95   Temp 99 F (37.2 C) (Oral)   Resp 16   LMP 02/13/2022 (Within Weeks)   SpO2 100%  Physical Exam Vitals and nursing note reviewed.  Constitutional:      Appearance: Normal appearance.  HENT:     Head: Normocephalic and atraumatic.     Mouth/Throat:     Mouth: Mucous membranes are moist.  Cardiovascular:     Rate and Rhythm: Tachycardia present.     Pulses:          Dorsalis pedis pulses are 2+ on the right side and 2+ on the left side.       Posterior tibial pulses are 2+ on the right side and 2+ on the left side.  Pulmonary:     Effort: Pulmonary effort is normal.     Breath sounds: No wheezing.  Abdominal:     General: Abdomen is flat.  Musculoskeletal:     Cervical back: Normal range of motion and neck supple.       Feet:  Feet:     Right foot:     Skin integrity: Callus present.     Left foot:     Skin integrity: Callus present.  Neurological:     Mental Status: She is alert and oriented to person, place, and time.     ED Results / Procedures / Treatments   Labs (all labs ordered are listed, but only abnormal results are displayed) Labs Reviewed  COMPREHENSIVE METABOLIC PANEL - Abnormal; Notable for the following components:      Result Value   Chloride 115 (*)    CO2 15 (*)    Glucose, Bld 102 (*)    BUN 55 (*)    Creatinine, Ser 4.10 (*)    Calcium 7.8 (*)    Albumin 3.3 (*)    AST 65 (*)    ALT 75 (*)    GFR, Estimated 13 (*)    All other components within normal limits  CBC WITH DIFFERENTIAL/PLATELET - Abnormal; Notable for  the following components:   RBC 2.86 (*)    Hemoglobin 8.7 (*)    HCT 27.1 (*)    All other components within normal limits  CULTURE, BLOOD (ROUTINE X 2)  CULTURE, BLOOD (ROUTINE X 2)  LACTIC ACID, PLASMA  PROTIME-INR  URINALYSIS, ROUTINE W REFLEX MICROSCOPIC  CBG MONITORING, ED    EKG None  Radiology DG Foot Complete Right  Result Date: 03/13/2022 CLINICAL DATA:  History of diabetes. Neuropathy. Large area of swelling on the bottom of her foot. EXAM: RIGHT FOOT COMPLETE - 3+ VIEW COMPARISON:  X-ray of the foot March 31, 2021 FINDINGS: Chronic changes in the midfoot and evidence of chronic fractures, particular in the third metatarsal. No acute fracture identified. Diffuse soft tissue swelling is identified. No osteomyelitis. Swelling along the bottom of the foot. A radiopaque density is identified within the soft tissue swelling based on the lateral view. IMPRESSION: 1. Swelling in the bottom of the foot. A radiopaque density is identified within the soft tissue swelling. This radiopaque density could represent a soft tissue calcification or a foreign body. A soft tissue calcification was not identified in this region on an x-ray from March 31, 2021 and the findings are suspicious for a foreign body. 2. No osteomyelitis identified.  MRI would be more sensitive. 3. Chronic changes in the midfoot. Electronically Signed   By: Dorise Bullion III M.D.   On: 03/13/2022 14:32    Procedures Procedures    Medications Ordered in ED Medications  oxyCODONE-acetaminophen (PERCOCET/ROXICET) 5-325 MG per tablet 1 tablet (1 tablet Oral Given 03/13/22 1831)    ED Course/ Medical Decision Making/ A&P                           Medical Decision Making Risk Prescription drug management.    This patient presents to the ED for concern of right foot pain, this involves a number of treatment options, and is a complaint that carries with it a high risk of complications and morbidity.  The  differential diagnosis includes cellulitis, osteomyelitis, foreign body.   Co morbidities: Discussed in HPI   Brief History:  Patient with underlying diabetes and high blood pressure here with right foot pain that is been ongoing for the past several days.  Prior history of right foot fracture treated by [facility].  Reports now endorsing fevers, chills and a Tmax of 102 at home.  EMR reviewed including pt PMHx, past surgical history and past visits to ER.   See HPI for more details   Lab Tests:  I ordered and independently interpreted labs.  The pertinent results include:    Labs notable for creatinine level of 4.1, consistent with her baseline.  LFTs elevated consistent with prior.  CBC with no leukocytosis.  Lactic acid is negative. PT and INR within normal limits.   Imaging Studies:    Xray of the right foot 1. Swelling in the bottom of the foot. A radiopaque density is  identified within the soft tissue swelling. This radiopaque density  could represent a soft tissue calcification or a foreign body. A  soft tissue calcification was not identified in this region on an  x-ray from March 31, 2021 and the findings are suspicious for a  foreign body.  2. No osteomyelitis identified.  MRI would be more sensitive.  3. Chronic changes in the midfoot.    Medicines ordered:  I ordered medication including percocet  for pain control Reevaluation of the patient after these medicines showed that the patient improved I have reviewed the patients home medicines and have made adjustments as needed    Consults:  I requested consultation with Dr. Amalia Hailey of podiatry,  and discussed lab and imaging findings as well as pertinent plan - they recommend:  antibiotics, outpatient follow up in clinic tomorrow morning.     Reevaluation:  After the interventions noted above I re-evaluated patient and found that they have :stayed the same   Social Determinants of  Health:  The patient's social determinants of health were a factor in the care of this patient    Problem List / ED Course:  She here with a growth to her right foot ongoing for several days.  Prior history of fracture to her right foot and did not have feeling, unknown if she had anything into her foot.  Labs are benign, she is afebrile in the ED with stable vital signs.  Lactic is negative, I do not suspect sepsis at this time.  Did discuss case with podiatry Dr. Amalia Hailey who will see patient in his office tomorrow morning.  She is to be started on doxycycline for 1 week.  I discussed this with patient, also given medication for pain control.  She is agreeable to plan and treatment, stable for discharge.   Dispostion:  After consideration of the diagnostic results and the patients response to treatment, I feel that the patent would benefit from outpatient follow up with podiatry.    Portions of this note were generated with [employer]. Dictation errors may occur despite best attempts at proofreading.  Final Clinical Impression(s) / ED Diagnoses Final diagnoses:  Right foot pain    Rx / DC Orders ED Discharge Orders          Ordered    doxycycline (VIBRAMYCIN) 100 MG capsule  2 times daily        03/13/22 1833    oxyCODONE-acetaminophen (PERCOCET/ROXICET) 5-325 MG tablet  Every 6 hours PRN        03/13/22 1842              Janeece Fitting, PA-C 03/13/22 1844    Cristie Hem, MD 03/13/22 [phone]

## 2022-03-13 NOTE — ED Triage Notes (Signed)
Patient complains of right foot swelling with burning up right leg. Large swollen area noted to bottom of foot x 3 days, pain worse with any ambulation

## 2022-03-13 NOTE — Discharge Instructions (Addendum)
You prescribed antibiotics on today's visit per recommendations of podiatry.  You will need to take 1 tablet twice a day for the next 7 days.  Please call Dr. Amalia Hailey office at [facility] first thing tomorrow morning in order to be seen tomorrow.  I also provided a prescription for pain medication, please take this for severe pain.

## 2022-03-13 NOTE — ED Provider Triage Note (Signed)
Emergency Medicine Provider Triage Evaluation Note  Jacqueline [name] , a 41 y.o. female  was evaluated in triage.  Pt complains of right foot wound.  Patient states that this has been developing for about 3 weeks now.  She states that particularly worse over the past 3 days.  She describes having severe pain and burning to her right foot.  Swelling to the lower extremity.  She is also had intermittent fevers that she states are up to 102.  She has also had nausea and vomiting.  She is diabetic.Marland Kitchen  Review of Systems  Positive: See above Negative:   Physical Exam  BP (!) 182/91   Pulse (!) 102   Temp 99 F (37.2 C) (Oral)   Resp 18   SpO2 100%  Gen:   Awake, no distress   Resp:  Normal effort  MSK:   Moves extremities without difficulty  Other:  Plantar surface of the right foot has a 3 to 4 cm, what appears to be possible abscess.  No drainage.  The foot and right lower extremity are grossly swollen.  It does appear that she has chronic swelling to both of her legs but right greater than left at this time.  Medical Decision Making  Medically screening exam initiated at 1:42 PM.  Appropriate orders placed.  Jacqueline [name] was informed that the remainder of the evaluation will be completed by another provider, this initial triage assessment does not replace that evaluation, and the importance of remaining in the ED until their evaluation is complete.     Jacqueline Hillier, PA-C 03/13/22 1343

## 2022-03-14 ENCOUNTER — Ambulatory Visit: Payer: BC Managed Care – PPO | Admitting: Podiatry

## 2022-03-14 DIAGNOSIS — L02611 Cutaneous abscess of right foot: Secondary | ICD-10-CM

## 2022-03-14 DIAGNOSIS — L0292 Furuncle, unspecified: Secondary | ICD-10-CM | POA: Diagnosis not present

## 2022-03-14 NOTE — Progress Notes (Signed)
Chief Complaint  Patient presents with   Foot Pain    Patient is here for foreign object in right foot.patient was advised er Dr. Amalia Hailey to come in to be seen.    HPI: 41 y.o. female presenting today as a new patient referral from the [facility], ED for evaluation of a possible foreign body to the plantar aspect of the right foot with overlying callus.  Patient has a diabetic and admits to walking around the house barefoot.  Patient states that over the past few days she noticed increased pain and tenderness to this area.  She presented to the ED yesterday and was discharged on oral doxycycline.  She presents for further treatment and evaluation  Past Medical History:  Diagnosis Date   Anemia    Anxiety    Chronic bronchitis (HCC)    Hypertension    Increased frequency of headaches    Morbid obesity ([place])    Sleep apnea    Type II diabetes mellitus [place])     Past Surgical History:  Procedure Laterality Date   BARIATRIC SURGERY     BREAST REDUCTION SURGERY  03/21/2017   CARDIAC CATHETERIZATION  01/06/2016   CARDIAC CATHETERIZATION N/A 01/06/2016   Procedure: Left Heart Cath and Coronary Angiography;  Surgeon: Wellington Hampshire, MD;  Location: [facility] CV LAB;  Service: Cardiovascular;  Laterality: N/A;   CESAREAN SECTION  08/2014   REDUCTION MAMMAPLASTY Bilateral 03/21/2017    No Known Allergies   Physical Exam: General: The patient is alert and oriented x3 in no acute distress.  Dermatology: Callus lesion noted to the plantar aspect of the right foot with some soft tissue fluctuance and sensitivity with palpation.  No drainage or erythema upon presentation  Vascular: Palpable pedal pulses bilaterally. Capillary refill within normal limits.  Negative for any significant edema or erythema  Neurological: Light touch and protective threshold grossly intact  Musculoskeletal Exam: Pes planus deformity noted  Radiographic Exam RT foot 03/13/2022 taken in the ED:   FINDINGS: Chronic changes in the midfoot and evidence of chronic fractures, particular in the third metatarsal. No acute fracture identified. Diffuse soft tissue swelling is identified. No osteomyelitis. Swelling along the bottom of the foot. A radiopaque density is identified within the soft tissue swelling based on the lateral view.   IMPRESSION: 1. Swelling in the bottom of the foot. A radiopaque density is identified within the soft tissue swelling. This radiopaque density could represent a soft tissue calcification or a foreign body. A soft tissue calcification was not identified in this region on an x-ray from March 31, 2021 and the findings are suspicious for a foreign body. 2. No osteomyelitis identified.  MRI would be more sensitive. 3. Chronic changes in the midfoot.  Assessment: 1.  Abscess with possible foreign body right plantar foot   Plan of Care:  1. Patient evaluated. X-Rays reviewed.  2.  Incision and drainage of the abscess was performed today.  After debridement there was some purulence that came from the lesion.   3.  Cultures were taken and sent to pathology for culture and sensitivity 4.  Continue oral doxycycline that was prescribed in the ED 5.  Postsurgical shoe dispensed.  Weightbearing as tolerated 6.  Betadine ointment was provided for the patient to apply daily with a light dressing 7.  Return to clinic in 1 week for follow-up.  If there continues to be pain and tenderness with no improvement we will need to likely proceed with removal  of the foreign body identified within the x-ray.  Prior to removal foreign body MRI would be beneficial  *Works for [employer], DPM [facility]  Dr. Edrick Kins, DPM    [address], [place] [address]                Office [phone]  Fax [phone]

## 2022-03-15 ENCOUNTER — Telehealth: Payer: Self-pay | Admitting: Podiatry

## 2022-03-15 ENCOUNTER — Encounter: Payer: Self-pay | Admitting: Podiatry

## 2022-03-15 NOTE — Telephone Encounter (Signed)
Okay to provide a note for the patient keeping her out of work until next week.  Thanks, Dr. Amalia Hailey

## 2022-03-15 NOTE — Telephone Encounter (Signed)
Patient called and stated she had a procedure done yesterday and she thought she could go to work. She went to work and she is in pain and she [place] unable to take her pain meds. An I write her a note to be out of work until next week.

## 2022-03-17 LAB — WOUND CULTURE
MICRO NUMBER:: 13840371
SPECIMEN QUALITY:: ADEQUATE

## 2022-03-18 LAB — CULTURE, BLOOD (ROUTINE X 2): Culture: NO GROWTH

## 2022-03-22 ENCOUNTER — Ambulatory Visit (INDEPENDENT_AMBULATORY_CARE_PROVIDER_SITE_OTHER): Payer: BC Managed Care – PPO | Admitting: Podiatry

## 2022-03-22 DIAGNOSIS — L02611 Cutaneous abscess of right foot: Secondary | ICD-10-CM

## 2022-03-22 MED ORDER — GENTAMICIN SULFATE 0.1 % EX CREA: 1.0000 | TOPICAL_CREAM | Freq: Two times a day (BID) | CUTANEOUS | 1 refills | Status: DC

## 2022-03-22 NOTE — Progress Notes (Signed)
Chief Complaint  Patient presents with   Follow-up    Patient is here for follow-up and she states that she is feeling fine today.    HPI: 41 y.o. female presenting today for follow-up evaluation of possible foreign body to the right plantar foot.  Last visit incision and drainage was performed.  Patient has noticed significant improvement.  She completed the oral antibiotics from the ED.  Presenting for further treatment and evaluation  Past Medical History:  Diagnosis Date   Anemia    Anxiety    Chronic bronchitis (HCC)    Hypertension    Increased frequency of headaches    Morbid obesity ([place])    Sleep apnea    Type II diabetes mellitus [facility])     Past Surgical History:  Procedure Laterality Date   BARIATRIC SURGERY     BREAST REDUCTION SURGERY  03/21/2017   CARDIAC CATHETERIZATION  01/06/2016   CARDIAC CATHETERIZATION N/A 01/06/2016   Procedure: Left Heart Cath and Coronary Angiography;  Surgeon: Wellington Hampshire, MD;  Location: [place] CV LAB;  Service: Cardiovascular;  Laterality: N/A;   CESAREAN SECTION  08/2014   REDUCTION MAMMAPLASTY Bilateral 03/21/2017    No Known Allergies   Physical Exam: General: The patient is alert and oriented x3 in no acute distress.  Dermatology: Callus continues to the plantar aspect of the right foot.  No fluctuance.  No sensitivity with palpation.  Minimal serous drainage.  Overall significant improvement to the plantar wound  Vascular: Palpable pedal pulses bilaterally. Capillary refill within normal limits.  Negative for any significant edema or erythema  Neurological: Light touch and protective threshold grossly intact  Musculoskeletal Exam: Pes planus deformity noted  Radiographic Exam RT foot 03/13/2022 taken in the ED:  FINDINGS: Chronic changes in the midfoot and evidence of chronic fractures, particular in the third metatarsal. No acute fracture identified. Diffuse soft tissue swelling is identified. No  osteomyelitis. Swelling along the bottom of the foot. A radiopaque density is identified within the soft tissue swelling based on the lateral view.   IMPRESSION: 1. Swelling in the bottom of the foot. A radiopaque density is identified within the soft tissue swelling. This radiopaque density could represent a soft tissue calcification or a foreign body. A soft tissue calcification was not identified in this region on an x-ray from March 31, 2021 and the findings are suspicious for a foreign body. 2. No osteomyelitis identified.  MRI would be more sensitive. 3. Chronic changes in the midfoot.  Assessment: 1.  Abscess with possible foreign body right plantar foot   Plan of Care:  1. Patient evaluated.  2.  Overall significant improvement.  There continues to be a very small superficial wound to the plantar aspect of the foot but it appears to be healing significantly. 3.  Prescription for gentamicin cream.  Apply 2 times daily with a Band-Aid 4.  If the superficial wound has not resolved in the next 1-2 weeks advised the patient to call into the office for MRI to check for foreign body.  We will order MRI and she will follow-up in the office after MRI to review the results   *Works for [employer], DPM [facility]  Dr. Edrick Kins, DPM    [address]  [place], [place] [address]                Office [phone]  Fax [phone]

## 2022-06-18 ENCOUNTER — Emergency Department (HOSPITAL_COMMUNITY): Payer: BC Managed Care – PPO

## 2022-06-18 ENCOUNTER — Other Ambulatory Visit: Payer: Self-pay

## 2022-06-18 ENCOUNTER — Encounter (HOSPITAL_COMMUNITY)
Admission: EM | Disposition: A | Discharge status: Other Acute Inpatient Hospital | Payer: Self-pay | Source: Home / Self Care | Attending: Internal Medicine

## 2022-06-18 ENCOUNTER — Emergency Department (HOSPITAL_COMMUNITY): Payer: BC Managed Care – PPO | Admitting: Anesthesiology

## 2022-06-18 ENCOUNTER — Encounter (HOSPITAL_COMMUNITY): Payer: Self-pay

## 2022-06-18 ENCOUNTER — Encounter (HOSPITAL_COMMUNITY): Payer: Self-pay | Admitting: Orthopedic Surgery

## 2022-06-18 ENCOUNTER — Inpatient Hospital Stay (HOSPITAL_COMMUNITY)
Admission: EM | Admit: 2022-06-18 | Discharge: 2022-06-29 | DRG: 853 | Payer: BC Managed Care – PPO | Attending: Internal Medicine | Admitting: Internal Medicine

## 2022-06-18 DIAGNOSIS — E11649 Type 2 diabetes mellitus with hypoglycemia without coma: Secondary | ICD-10-CM | POA: Diagnosis not present

## 2022-06-18 DIAGNOSIS — E1122 Type 2 diabetes mellitus with diabetic chronic kidney disease: Secondary | ICD-10-CM | POA: Diagnosis not present

## 2022-06-18 DIAGNOSIS — E1110 Type 2 diabetes mellitus with ketoacidosis without coma: Secondary | ICD-10-CM | POA: Diagnosis not present

## 2022-06-18 DIAGNOSIS — E785 Hyperlipidemia, unspecified: Secondary | ICD-10-CM | POA: Diagnosis not present

## 2022-06-18 DIAGNOSIS — E871 Hypo-osmolality and hyponatremia: Secondary | ICD-10-CM | POA: Diagnosis not present

## 2022-06-18 DIAGNOSIS — B370 Candidal stomatitis: Secondary | ICD-10-CM | POA: Diagnosis not present

## 2022-06-18 DIAGNOSIS — G4730 Sleep apnea, unspecified: Secondary | ICD-10-CM | POA: Diagnosis not present

## 2022-06-18 DIAGNOSIS — E1165 Type 2 diabetes mellitus with hyperglycemia: Secondary | ICD-10-CM | POA: Diagnosis present

## 2022-06-18 DIAGNOSIS — I130 Hypertensive heart and chronic kidney disease with heart failure and stage 1 through stage 4 chronic kidney disease, or unspecified chronic kidney disease: Secondary | ICD-10-CM | POA: Diagnosis not present

## 2022-06-18 DIAGNOSIS — A419 Sepsis, unspecified organism: Secondary | ICD-10-CM | POA: Diagnosis not present

## 2022-06-18 DIAGNOSIS — I959 Hypotension, unspecified: Secondary | ICD-10-CM | POA: Diagnosis not present

## 2022-06-18 DIAGNOSIS — L03115 Cellulitis of right lower limb: Secondary | ICD-10-CM | POA: Diagnosis present

## 2022-06-18 DIAGNOSIS — K6812 Psoas muscle abscess: Secondary | ICD-10-CM | POA: Diagnosis not present

## 2022-06-18 DIAGNOSIS — E872 Acidosis, unspecified: Secondary | ICD-10-CM | POA: Diagnosis present

## 2022-06-18 DIAGNOSIS — D62 Acute posthemorrhagic anemia: Secondary | ICD-10-CM | POA: Diagnosis not present

## 2022-06-18 DIAGNOSIS — R6521 Severe sepsis with septic shock: Secondary | ICD-10-CM | POA: Diagnosis not present

## 2022-06-18 DIAGNOSIS — Z79899 Other long term (current) drug therapy: Secondary | ICD-10-CM

## 2022-06-18 DIAGNOSIS — F418 Other specified anxiety disorders: Secondary | ICD-10-CM | POA: Diagnosis not present

## 2022-06-18 DIAGNOSIS — F4321 Adjustment disorder with depressed mood: Secondary | ICD-10-CM | POA: Diagnosis not present

## 2022-06-18 DIAGNOSIS — Z20822 Contact with and (suspected) exposure to covid-19: Secondary | ICD-10-CM | POA: Diagnosis present

## 2022-06-18 DIAGNOSIS — N179 Acute kidney failure, unspecified: Secondary | ICD-10-CM | POA: Diagnosis present

## 2022-06-18 DIAGNOSIS — I110 Hypertensive heart disease with heart failure: Secondary | ICD-10-CM | POA: Diagnosis not present

## 2022-06-18 DIAGNOSIS — M726 Necrotizing fasciitis: Secondary | ICD-10-CM | POA: Diagnosis present

## 2022-06-18 DIAGNOSIS — M729 Fibroblastic disorder, unspecified: Secondary | ICD-10-CM | POA: Diagnosis present

## 2022-06-18 DIAGNOSIS — E1169 Type 2 diabetes mellitus with other specified complication: Secondary | ICD-10-CM | POA: Diagnosis not present

## 2022-06-18 DIAGNOSIS — S71101S Unspecified open wound, right thigh, sequela: Secondary | ICD-10-CM | POA: Diagnosis not present

## 2022-06-18 DIAGNOSIS — R609 Edema, unspecified: Secondary | ICD-10-CM | POA: Diagnosis not present

## 2022-06-18 DIAGNOSIS — F32A Depression, unspecified: Secondary | ICD-10-CM | POA: Diagnosis not present

## 2022-06-18 DIAGNOSIS — Z9884 Bariatric surgery status: Secondary | ICD-10-CM | POA: Diagnosis not present

## 2022-06-18 DIAGNOSIS — E1142 Type 2 diabetes mellitus with diabetic polyneuropathy: Secondary | ICD-10-CM | POA: Diagnosis not present

## 2022-06-18 DIAGNOSIS — Z833 Family history of diabetes mellitus: Secondary | ICD-10-CM

## 2022-06-18 DIAGNOSIS — Z8249 Family history of ischemic heart disease and other diseases of the circulatory system: Secondary | ICD-10-CM

## 2022-06-18 DIAGNOSIS — N170 Acute kidney failure with tubular necrosis: Secondary | ICD-10-CM | POA: Diagnosis not present

## 2022-06-18 DIAGNOSIS — N184 Chronic kidney disease, stage 4 (severe): Secondary | ICD-10-CM | POA: Diagnosis present

## 2022-06-18 DIAGNOSIS — N1832 Chronic kidney disease, stage 3b: Secondary | ICD-10-CM | POA: Diagnosis not present

## 2022-06-18 DIAGNOSIS — M7989 Other specified soft tissue disorders: Secondary | ICD-10-CM | POA: Diagnosis not present

## 2022-06-18 DIAGNOSIS — I480 Paroxysmal atrial fibrillation: Secondary | ICD-10-CM | POA: Diagnosis not present

## 2022-06-18 DIAGNOSIS — Z6833 Body mass index (BMI) 33.0-33.9, adult: Secondary | ICD-10-CM

## 2022-06-18 DIAGNOSIS — F419 Anxiety disorder, unspecified: Secondary | ICD-10-CM | POA: Diagnosis present

## 2022-06-18 DIAGNOSIS — W19XXXA Unspecified fall, initial encounter: Secondary | ICD-10-CM | POA: Diagnosis not present

## 2022-06-18 DIAGNOSIS — E8809 Other disorders of plasma-protein metabolism, not elsewhere classified: Secondary | ICD-10-CM | POA: Diagnosis present

## 2022-06-18 DIAGNOSIS — I509 Heart failure, unspecified: Secondary | ICD-10-CM | POA: Diagnosis not present

## 2022-06-18 DIAGNOSIS — E875 Hyperkalemia: Secondary | ICD-10-CM | POA: Diagnosis present

## 2022-06-18 DIAGNOSIS — E669 Obesity, unspecified: Secondary | ICD-10-CM | POA: Diagnosis present

## 2022-06-18 DIAGNOSIS — N183 Chronic kidney disease, stage 3 unspecified: Secondary | ICD-10-CM | POA: Diagnosis present

## 2022-06-18 DIAGNOSIS — R278 Other lack of coordination: Secondary | ICD-10-CM | POA: Diagnosis not present

## 2022-06-18 DIAGNOSIS — I129 Hypertensive chronic kidney disease with stage 1 through stage 4 chronic kidney disease, or unspecified chronic kidney disease: Secondary | ICD-10-CM | POA: Diagnosis present

## 2022-06-18 DIAGNOSIS — G4733 Obstructive sleep apnea (adult) (pediatric): Secondary | ICD-10-CM | POA: Diagnosis present

## 2022-06-18 DIAGNOSIS — R200 Anesthesia of skin: Secondary | ICD-10-CM | POA: Diagnosis not present

## 2022-06-18 DIAGNOSIS — S71001S Unspecified open wound, right hip, sequela: Secondary | ICD-10-CM | POA: Diagnosis not present

## 2022-06-18 DIAGNOSIS — T68XXXA Hypothermia, initial encounter: Secondary | ICD-10-CM | POA: Diagnosis not present

## 2022-06-18 DIAGNOSIS — N1330 Unspecified hydronephrosis: Secondary | ICD-10-CM | POA: Diagnosis not present

## 2022-06-18 DIAGNOSIS — N185 Chronic kidney disease, stage 5: Secondary | ICD-10-CM | POA: Diagnosis not present

## 2022-06-18 DIAGNOSIS — E1129 Type 2 diabetes mellitus with other diabetic kidney complication: Secondary | ICD-10-CM | POA: Diagnosis present

## 2022-06-18 DIAGNOSIS — E119 Type 2 diabetes mellitus without complications: Secondary | ICD-10-CM

## 2022-06-18 DIAGNOSIS — R5381 Other malaise: Secondary | ICD-10-CM | POA: Diagnosis not present

## 2022-06-18 DIAGNOSIS — D631 Anemia in chronic kidney disease: Secondary | ICD-10-CM | POA: Diagnosis not present

## 2022-06-18 DIAGNOSIS — I120 Hypertensive chronic kidney disease with stage 5 chronic kidney disease or end stage renal disease: Secondary | ICD-10-CM | POA: Diagnosis not present

## 2022-06-18 DIAGNOSIS — I5032 Chronic diastolic (congestive) heart failure: Secondary | ICD-10-CM | POA: Diagnosis not present

## 2022-06-18 DIAGNOSIS — Z794 Long term (current) use of insulin: Secondary | ICD-10-CM | POA: Diagnosis not present

## 2022-06-18 DIAGNOSIS — K760 Fatty (change of) liver, not elsewhere classified: Secondary | ICD-10-CM | POA: Diagnosis not present

## 2022-06-18 DIAGNOSIS — R509 Fever, unspecified: Secondary | ICD-10-CM | POA: Diagnosis not present

## 2022-06-18 DIAGNOSIS — I10 Essential (primary) hypertension: Secondary | ICD-10-CM | POA: Diagnosis not present

## 2022-06-18 DIAGNOSIS — R519 Headache, unspecified: Secondary | ICD-10-CM | POA: Diagnosis present

## 2022-06-18 DIAGNOSIS — R0689 Other abnormalities of breathing: Secondary | ICD-10-CM | POA: Diagnosis not present

## 2022-06-18 DIAGNOSIS — R070 Pain in throat: Secondary | ICD-10-CM | POA: Diagnosis not present

## 2022-06-18 DIAGNOSIS — Z7982 Long term (current) use of aspirin: Secondary | ICD-10-CM

## 2022-06-18 HISTORY — PX: I & D EXTREMITY: SHX5045

## 2022-06-18 LAB — BASIC METABOLIC PANEL
Anion gap: 15 (ref 5–15)
BUN: 63 mg/dL — ABNORMAL HIGH (ref 6–20)
CO2: 12 mmol/L — ABNORMAL LOW (ref 22–32)
Calcium: 7.4 mg/dL — ABNORMAL LOW (ref 8.9–10.3)
Chloride: 113 mmol/L — ABNORMAL HIGH (ref 98–111)
Creatinine, Ser: 5.15 mg/dL — ABNORMAL HIGH (ref 0.44–1.00)
GFR, Estimated: 10 mL/min — ABNORMAL LOW (ref >60)
Glucose, Bld: 110 mg/dL — ABNORMAL HIGH (ref 70–99)
Potassium: 5.1 mmol/L (ref 3.5–5.1)
Sodium: 140 mmol/L (ref 135–145)

## 2022-06-18 LAB — CBC WITH DIFFERENTIAL/PLATELET
Abs Immature Granulocytes: 0 10*3/uL (ref 0.00–0.07)
Basophils Absolute: 0 10*3/uL (ref 0.0–0.1)
Basophils Relative: 0 %
Eosinophils Absolute: 0 10*3/uL (ref 0.0–0.5)
Eosinophils Relative: 0 %
HCT: 18.7 % — ABNORMAL LOW (ref 36.0–46.0)
Hemoglobin: 5.9 g/dL — CL (ref 12.0–15.0)
Lymphocytes Relative: 2 %
Lymphs Abs: 0.6 10*3/uL — ABNORMAL LOW (ref 0.7–4.0)
MCH: 28.9 pg (ref 26.0–34.0)
MCHC: 31.6 g/dL (ref 30.0–36.0)
MCV: 91.7 fL (ref 80.0–100.0)
Monocytes Absolute: 0.6 10*3/uL (ref 0.1–1.0)
Monocytes Relative: 2 %
Neutro Abs: 26.7 10*3/uL — ABNORMAL HIGH (ref 1.7–7.7)
Neutrophils Relative %: 96 %
Platelets: 353 10*3/uL (ref 150–400)
RBC: 2.04 MIL/uL — ABNORMAL LOW (ref 3.87–5.11)
RDW: 14 % (ref 11.5–15.5)
WBC: 27.8 10*3/uL — ABNORMAL HIGH (ref 4.0–10.5)
nRBC: 0 % (ref 0.0–0.2)
nRBC: 0 /100 WBC

## 2022-06-18 LAB — COMPREHENSIVE METABOLIC PANEL
ALT: 21 U/L (ref 0–44)
AST: 23 U/L (ref 15–41)
Albumin: 1.7 g/dL — ABNORMAL LOW (ref 3.5–5.0)
Alkaline Phosphatase: 71 U/L (ref 38–126)
Anion gap: 11 (ref 5–15)
BUN: 61 mg/dL — ABNORMAL HIGH (ref 6–20)
CO2: 16 mmol/L — ABNORMAL LOW (ref 22–32)
Calcium: 7.3 mg/dL — ABNORMAL LOW (ref 8.9–10.3)
Chloride: 111 mmol/L (ref 98–111)
Creatinine, Ser: 5.57 mg/dL — ABNORMAL HIGH (ref 0.44–1.00)
GFR, Estimated: 9 mL/min — ABNORMAL LOW (ref >60)
Glucose, Bld: 171 mg/dL — ABNORMAL HIGH (ref 70–99)
Potassium: 4.1 mmol/L (ref 3.5–5.1)
Sodium: 138 mmol/L (ref 135–145)
Total Bilirubin: 0.7 mg/dL (ref 0.3–1.2)
Total Protein: 6 g/dL — ABNORMAL LOW (ref 6.5–8.1)

## 2022-06-18 LAB — MRSA NEXT GEN BY PCR, NASAL: MRSA by PCR Next Gen: NOT DETECTED

## 2022-06-18 LAB — URINALYSIS, ROUTINE W REFLEX MICROSCOPIC
Bilirubin Urine: NEGATIVE
Glucose, UA: 50 mg/dL — AB
Hgb urine dipstick: NEGATIVE
Ketones, ur: NEGATIVE mg/dL
Nitrite: NEGATIVE
Protein, ur: >=300 mg/dL — AB
Specific Gravity, Urine: 1.013 (ref 1.005–1.030)
pH: 7 (ref 5.0–8.0)

## 2022-06-18 LAB — PREPARE RBC (CROSSMATCH)

## 2022-06-18 LAB — LACTIC ACID, PLASMA
Lactic Acid, Venous: 1.9 mmol/L (ref 0.5–1.9)
Lactic Acid, Venous: 2.4 mmol/L (ref 0.5–1.9)

## 2022-06-18 LAB — RESP PANEL BY RT-PCR (FLU A&B, COVID) ARPGX2
Influenza A by PCR: NEGATIVE
Influenza B by PCR: NEGATIVE
SARS Coronavirus 2 by RT PCR: NEGATIVE

## 2022-06-18 LAB — I-STAT BETA HCG BLOOD, ED (MC, WL, AP ONLY): I-stat hCG, quantitative: 7.5 m[IU]/mL — ABNORMAL HIGH (ref <5)

## 2022-06-18 LAB — HEMOGLOBIN AND HEMATOCRIT, BLOOD
HCT: 32.2 % — ABNORMAL LOW (ref 36.0–46.0)
Hemoglobin: 10.9 g/dL — ABNORMAL LOW (ref 12.0–15.0)

## 2022-06-18 LAB — GLUCOSE, CAPILLARY
Glucose-Capillary: 110 mg/dL — ABNORMAL HIGH (ref 70–99)
Glucose-Capillary: 138 mg/dL — ABNORMAL HIGH (ref 70–99)

## 2022-06-18 SURGERY — IRRIGATION AND DEBRIDEMENT EXTREMITY
Anesthesia: General | Site: Abdomen | Laterality: Right

## 2022-06-18 MED ORDER — DEXAMETHASONE SODIUM PHOSPHATE 10 MG/ML IJ SOLN
INTRAMUSCULAR | Status: AC
  Filled 2022-06-18: qty 1

## 2022-06-18 MED ORDER — LACTATED RINGERS IV SOLN: INTRAVENOUS | Status: DC

## 2022-06-18 MED ORDER — SODIUM CHLORIDE 0.9 % IV BOLUS
500.0000 mL | Freq: Once | INTRAVENOUS | Status: AC
  Administered 2022-06-18: 500 mL via INTRAVENOUS

## 2022-06-18 MED ORDER — FENTANYL CITRATE (PF) 250 MCG/5ML IJ SOLN
INTRAMUSCULAR | Status: AC
  Filled 2022-06-18: qty 5

## 2022-06-18 MED ORDER — STERILE WATER FOR INJECTION IV SOLN
INTRAVENOUS | Status: AC
  Filled 2022-06-18: qty 1000

## 2022-06-18 MED ORDER — SUGAMMADEX SODIUM 200 MG/2ML IV SOLN
INTRAVENOUS | Status: DC | PRN
  Administered 2022-06-18: 180 mg via INTRAVENOUS

## 2022-06-18 MED ORDER — CHLORHEXIDINE GLUCONATE CLOTH 2 % EX PADS
6.0000 | MEDICATED_PAD | Freq: Every day | CUTANEOUS | Status: DC
  Administered 2022-06-18 – 2022-06-29 (×10): 6 via TOPICAL

## 2022-06-18 MED ORDER — OXYCODONE HCL 5 MG/5ML PO SOLN
5.0000 mg | Freq: Once | ORAL | Status: AC | PRN
  Administered 2022-06-18: 5 mg via ORAL

## 2022-06-18 MED ORDER — CARVEDILOL 12.5 MG PO TABS
25.0000 mg | ORAL_TABLET | Freq: Once | ORAL | Status: AC
  Administered 2022-06-18: 25 mg via ORAL
  Filled 2022-06-18: qty 2

## 2022-06-18 MED ORDER — ONDANSETRON HCL 4 MG/2ML IJ SOLN
INTRAMUSCULAR | Status: AC
  Filled 2022-06-18: qty 2

## 2022-06-18 MED ORDER — PROMETHAZINE HCL 25 MG/ML IJ SOLN: 6.2500 mg | INTRAMUSCULAR | Status: DC | PRN

## 2022-06-18 MED ORDER — LIDOCAINE 2% (20 MG/ML) 5 ML SYRINGE
INTRAMUSCULAR | Status: AC
  Filled 2022-06-18: qty 10

## 2022-06-18 MED ORDER — SODIUM CHLORIDE 0.9 % IV SOLN
1.0000 g | Freq: Two times a day (BID) | INTRAVENOUS | Status: DC
  Administered 2022-06-18 – 2022-06-21 (×6): 1 g via INTRAVENOUS
  Filled 2022-06-18 (×8): qty 20

## 2022-06-18 MED ORDER — OXYCODONE HCL 5 MG PO TABS: 5.0000 mg | ORAL_TABLET | Freq: Once | ORAL | Status: AC | PRN

## 2022-06-18 MED ORDER — ACETAMINOPHEN 500 MG PO TABS
1000.0000 mg | ORAL_TABLET | Freq: Once | ORAL | Status: AC
  Administered 2022-06-18: 1000 mg via ORAL

## 2022-06-18 MED ORDER — FENTANYL CITRATE (PF) 250 MCG/5ML IJ SOLN
INTRAMUSCULAR | Status: DC | PRN
  Administered 2022-06-18: 100 ug via INTRAVENOUS

## 2022-06-18 MED ORDER — FENTANYL CITRATE (PF) 100 MCG/2ML IJ SOLN
25.0000 ug | INTRAMUSCULAR | Status: DC | PRN
  Administered 2022-06-18 (×2): 50 ug via INTRAVENOUS

## 2022-06-18 MED ORDER — INSULIN ASPART 100 UNIT/ML IJ SOLN
3.0000 [IU] | INTRAMUSCULAR | Status: DC
  Administered 2022-06-18 – 2022-06-19 (×2): 3 [IU] via SUBCUTANEOUS

## 2022-06-18 MED ORDER — PANTOPRAZOLE SODIUM 40 MG IV SOLR
40.0000 mg | Freq: Every day | INTRAVENOUS | Status: DC
  Administered 2022-06-18 – 2022-06-22 (×6): 40 mg via INTRAVENOUS
  Filled 2022-06-18 (×5): qty 10

## 2022-06-18 MED ORDER — SUCCINYLCHOLINE CHLORIDE 200 MG/10ML IV SOSY
PREFILLED_SYRINGE | INTRAVENOUS | Status: DC | PRN
  Administered 2022-06-18: 100 mg via INTRAVENOUS

## 2022-06-18 MED ORDER — ALBUMIN HUMAN 5 % IV SOLN: INTRAVENOUS | Status: DC | PRN

## 2022-06-18 MED ORDER — ROCURONIUM BROMIDE 10 MG/ML (PF) SYRINGE
PREFILLED_SYRINGE | INTRAVENOUS | Status: AC
  Filled 2022-06-18: qty 20

## 2022-06-18 MED ORDER — HYDROMORPHONE HCL 1 MG/ML IJ SOLN
1.0000 mg | Freq: Once | INTRAMUSCULAR | Status: AC
  Administered 2022-06-18: 1 mg via INTRAVENOUS
  Filled 2022-06-18: qty 1

## 2022-06-18 MED ORDER — OXYCODONE HCL 5 MG/5ML PO SOLN
ORAL | Status: AC
  Filled 2022-06-18: qty 5

## 2022-06-18 MED ORDER — HEPARIN SODIUM (PORCINE) 5000 UNIT/ML IJ SOLN
5000.0000 [IU] | Freq: Three times a day (TID) | INTRAMUSCULAR | Status: DC
  Administered 2022-06-18 – 2022-06-29 (×33): 5000 [IU] via SUBCUTANEOUS
  Filled 2022-06-18 (×32): qty 1

## 2022-06-18 MED ORDER — HYDROMORPHONE HCL 1 MG/ML IJ SOLN
1.0000 mg | INTRAMUSCULAR | Status: DC | PRN
  Administered 2022-06-18 – 2022-06-20 (×6): 1 mg via INTRAVENOUS
  Filled 2022-06-18 (×6): qty 1

## 2022-06-18 MED ORDER — ROCURONIUM BROMIDE 10 MG/ML (PF) SYRINGE
PREFILLED_SYRINGE | INTRAVENOUS | Status: DC | PRN
  Administered 2022-06-18: 50 mg via INTRAVENOUS

## 2022-06-18 MED ORDER — CHLORHEXIDINE GLUCONATE 0.12 % MT SOLN
15.0000 mL | Freq: Once | OROMUCOSAL | Status: AC
  Filled 2022-06-18: qty 15

## 2022-06-18 MED ORDER — ONDANSETRON HCL 4 MG/2ML IJ SOLN
INTRAMUSCULAR | Status: DC | PRN
  Administered 2022-06-18: 4 mg via INTRAVENOUS

## 2022-06-18 MED ORDER — LINEZOLID 600 MG/300ML IV SOLN
600.0000 mg | Freq: Two times a day (BID) | INTRAVENOUS | Status: DC
  Administered 2022-06-18 – 2022-06-22 (×8): 600 mg via INTRAVENOUS
  Filled 2022-06-18 (×9): qty 300

## 2022-06-18 MED ORDER — CHLORHEXIDINE GLUCONATE CLOTH 2 % EX PADS
6.0000 | MEDICATED_PAD | Freq: Once | CUTANEOUS | Status: AC
  Administered 2022-06-18: 6 via TOPICAL

## 2022-06-18 MED ORDER — MIDAZOLAM HCL 2 MG/2ML IJ SOLN
INTRAMUSCULAR | Status: AC
  Filled 2022-06-18: qty 2

## 2022-06-18 MED ORDER — PHENYLEPHRINE HCL-NACL 20-0.9 MG/250ML-% IV SOLN
INTRAVENOUS | Status: DC | PRN
  Administered 2022-06-18: 25 ug/min via INTRAVENOUS

## 2022-06-18 MED ORDER — OXYCODONE HCL 5 MG PO TABS
5.0000 mg | ORAL_TABLET | ORAL | Status: DC | PRN
  Administered 2022-06-19 – 2022-06-23 (×8): 10 mg via ORAL
  Filled 2022-06-18 (×10): qty 2

## 2022-06-18 MED ORDER — FENTANYL CITRATE (PF) 100 MCG/2ML IJ SOLN
INTRAMUSCULAR | Status: AC
  Filled 2022-06-18: qty 2

## 2022-06-18 MED ORDER — SODIUM CHLORIDE 0.9 % IV SOLN: INTRAVENOUS | Status: DC

## 2022-06-18 MED ORDER — PIPERACILLIN-TAZOBACTAM 3.375 G IVPB 30 MIN
3.3750 g | Freq: Once | INTRAVENOUS | Status: AC
  Administered 2022-06-18: 3.375 g via INTRAVENOUS
  Filled 2022-06-18: qty 50

## 2022-06-18 MED ORDER — MIDAZOLAM HCL 2 MG/2ML IJ SOLN
INTRAMUSCULAR | Status: DC | PRN
  Administered 2022-06-18: 2 mg via INTRAVENOUS

## 2022-06-18 MED ORDER — CHLORHEXIDINE GLUCONATE 0.12 % MT SOLN
OROMUCOSAL | Status: AC
  Administered 2022-06-18: 15 mL via OROMUCOSAL
  Filled 2022-06-18: qty 15

## 2022-06-18 MED ORDER — PHENYLEPHRINE 80 MCG/ML (10ML) SYRINGE FOR IV PUSH (FOR BLOOD PRESSURE SUPPORT)
PREFILLED_SYRINGE | INTRAVENOUS | Status: AC
  Filled 2022-06-18: qty 10

## 2022-06-18 MED ORDER — ONDANSETRON HCL 4 MG/2ML IJ SOLN
4.0000 mg | Freq: Once | INTRAMUSCULAR | Status: AC
  Administered 2022-06-18: 4 mg via INTRAVENOUS
  Filled 2022-06-18: qty 2

## 2022-06-18 MED ORDER — ACETAMINOPHEN 500 MG PO TABS
ORAL_TABLET | ORAL | Status: AC
  Filled 2022-06-18: qty 2

## 2022-06-18 MED ORDER — ORAL CARE MOUTH RINSE: 15.0000 mL | Freq: Once | OROMUCOSAL | Status: AC

## 2022-06-18 MED ORDER — PROPOFOL 10 MG/ML IV BOLUS
INTRAVENOUS | Status: DC | PRN
  Administered 2022-06-18: 150 mg via INTRAVENOUS

## 2022-06-18 MED ORDER — PROPOFOL 10 MG/ML IV BOLUS
INTRAVENOUS | Status: AC
  Filled 2022-06-18: qty 20

## 2022-06-18 MED ORDER — 0.9 % SODIUM CHLORIDE (POUR BTL) OPTIME
TOPICAL | Status: DC | PRN
  Administered 2022-06-18: 1000 mL

## 2022-06-18 MED ORDER — SODIUM CHLORIDE 0.9 % IV SOLN: 10.0000 mL/h | Freq: Once | INTRAVENOUS | Status: AC

## 2022-06-18 MED ORDER — PHENYLEPHRINE 80 MCG/ML (10ML) SYRINGE FOR IV PUSH (FOR BLOOD PRESSURE SUPPORT)
PREFILLED_SYRINGE | INTRAVENOUS | Status: DC | PRN
  Administered 2022-06-18: 160 ug via INTRAVENOUS
  Administered 2022-06-18: 80 ug via INTRAVENOUS

## 2022-06-18 MED ORDER — SUCCINYLCHOLINE CHLORIDE 200 MG/10ML IV SOSY
PREFILLED_SYRINGE | INTRAVENOUS | Status: AC
  Filled 2022-06-18: qty 10

## 2022-06-18 MED ORDER — ACETAMINOPHEN 500 MG PO TABS
1000.0000 mg | ORAL_TABLET | Freq: Once | ORAL | Status: AC
  Administered 2022-06-18: 1000 mg via ORAL
  Filled 2022-06-18: qty 2

## 2022-06-18 MED ORDER — DEXAMETHASONE SODIUM PHOSPHATE 10 MG/ML IJ SOLN
INTRAMUSCULAR | Status: DC | PRN
  Administered 2022-06-18: 4 mg via INTRAVENOUS

## 2022-06-18 MED ORDER — LIDOCAINE 2% (20 MG/ML) 5 ML SYRINGE
INTRAMUSCULAR | Status: DC | PRN
  Administered 2022-06-18: 60 mg via INTRAVENOUS

## 2022-06-18 SURGICAL SUPPLY — 39 items
BAG COUNTER SPONGE SURGICOUNT (BAG) ×1 IMPLANT
BAG SPNG CNTER NS LX DISP (BAG) ×1
BLADE CLIPPER SURG (BLADE) IMPLANT
BNDG CMPR 5X6 CHSV STRCH STRL (GAUZE/BANDAGES/DRESSINGS) ×1
BNDG COHESIVE 6X5 TAN ST LF (GAUZE/BANDAGES/DRESSINGS) IMPLANT
BNDG GAUZE DERMACEA FLUFF 4 (GAUZE/BANDAGES/DRESSINGS) IMPLANT
BNDG GZE DERMACEA 4 6PLY (GAUZE/BANDAGES/DRESSINGS) ×3
CANISTER SUCT 3000ML PPV (MISCELLANEOUS) ×1 IMPLANT
COVER SURGICAL LIGHT HANDLE (MISCELLANEOUS) ×1 IMPLANT
DRAPE HALF SHEET 40X57 (DRAPES) IMPLANT
DRAPE IMP U-DRAPE 54X76 (DRAPES) IMPLANT
DRAPE INCISE IOBAN 66X45 STRL (DRAPES) IMPLANT
DRAPE LAPAROSCOPIC ABDOMINAL (DRAPES) IMPLANT
DRAPE LAPAROTOMY 100X72 PEDS (DRAPES) IMPLANT
DRAPE ORTHO SPLIT 77X108 STRL (DRAPES) ×2
DRAPE SURG ORHT 6 SPLT 77X108 (DRAPES) IMPLANT
ELECT REM PT RETURN 9FT ADLT (ELECTROSURGICAL) ×1
ELECTRODE REM PT RTRN 9FT ADLT (ELECTROSURGICAL) ×1 IMPLANT
GAUZE PAD ABD 8X10 STRL (GAUZE/BANDAGES/DRESSINGS) IMPLANT
GAUZE SPONGE 4X4 12PLY STRL (GAUZE/BANDAGES/DRESSINGS) IMPLANT
GLOVE BIOGEL PI IND STRL 7.0 (GLOVE) ×1 IMPLANT
GLOVE SURG SS PI 7.0 STRL IVOR (GLOVE) ×1 IMPLANT
GOWN STRL REUS W/ TWL LRG LVL3 (GOWN DISPOSABLE) ×2 IMPLANT
GOWN STRL REUS W/TWL LRG LVL3 (GOWN DISPOSABLE) ×3
KIT BASIN OR (CUSTOM PROCEDURE TRAY) ×1 IMPLANT
KIT TURNOVER KIT B (KITS) ×1 IMPLANT
MANIFOLD NEPTUNE II (INSTRUMENTS) IMPLANT
NDL 22X1.5 STRL (OR ONLY) (MISCELLANEOUS) ×1 IMPLANT
NEEDLE 22X1.5 STRL (OR ONLY) (MISCELLANEOUS) IMPLANT
NS IRRIG 1000ML POUR BTL (IV SOLUTION) ×1 IMPLANT
PACK GENERAL/GYN (CUSTOM PROCEDURE TRAY) ×1 IMPLANT
PAD ABD 8X10 STRL (GAUZE/BANDAGES/DRESSINGS) IMPLANT
PAD ARMBOARD 7.5X6 YLW CONV (MISCELLANEOUS) ×1 IMPLANT
PENCIL SMOKE EVACUATOR (MISCELLANEOUS) ×1 IMPLANT
STOCKINETTE IMPERVIOUS LG (DRAPES) IMPLANT
SWAB COLLECTION DEVICE MRSA (MISCELLANEOUS) IMPLANT
SWAB CULTURE ESWAB REG 1ML (MISCELLANEOUS) IMPLANT
TOWEL GREEN STERILE (TOWEL DISPOSABLE) ×1 IMPLANT
TOWEL GREEN STERILE FF (TOWEL DISPOSABLE) ×1 IMPLANT

## 2022-06-18 NOTE — Op Note (Signed)
OPERATIVE NOTE  [place] female 41 y.o. 06/18/2022  PREOPERATIVE DIAGNOSIS: Right iliopsoas abscess  Right intrapelvic necrotizing fasciitis Right thigh necrotizing fasciitis  POSTOPERATIVE DIAGNOSIS: Right iliopsoas abscess (Z60.10) Right intrapelvic necrotizing fasciitis (M72.6) Right thigh necrotizing fasciitis (M72.6)  PROCEDURE(S): Right thigh I&D (93235) Right pelvic I&D per Dr. Alvino Blood  SURGEON: Georgeanna Harrison, M.D.  CO-SURGEON: Doyce Para, M.D.  ASSISTANT(S): None   ANESTHESIA: General  FINDINGS: Preoperative Examination: Right Lower Extremity: Inspection: Atraumatic appearance, no skin discoloration Palpation: Tender to deep palpation proximally near the groin, but otherwise no pain to palpation due to lack of sensation ROM: Discomfort with hip range of motion; unable to actively range any aspects of right lower extremity Strength: Unable to maintain straight leg raise, flex the hip, or demonstrate DF/PF/EHL Sensation: Globally diminished to light touch throughout right lower extremity Skin: Intact without blistering or discoloration Peripheral Vascular: Normal DP pulse, warm and well-perfused distally  Operative Findings: Purulence tracking through the anterior compartment musculature involving rectus femoris, vastus medialis, and vastus intermedius, as well as the adductor compartment musculature medially.  Purulence and infection tracking proximally along the psoas with involvement of the femoral vessels with extension into the pelvis proper, along the right iliac wing.  Thorough debridement including removal of devitalized tissue and evacuation of purulence.  Healthy appearing viable tissue at the conclusion of debridement.  IMPLANTS: * No implants in log *  INDICATIONS:  The patient is a 41 y.o. female presenting with several days of abdominal and right hip pain associated with progressive loss of sensation and weakness in the right lower extremity  associated with fevers.  Workup in the ED revealed intrapelvic psoas abscess infection with necrotizing characteristics including gas extending along the intrapelvic musculature on the right down along the psoas involving the anterior and adductor compartment musculature of the thigh.  With concern for evolving soft tissue necrotizing infection, she was recommended to undergo urgent debridement, in conjunction with general surgery.  She understood the risks, benefits and alternatives to surgery which include but are not limited to bleeding, wound healing complications, infection, damage to surrounding structures, persistent pain, stiffness, lack of improvement, potential for subsequent arthritis or worsening of pre-existing arthritis, and need for further surgery, as well as complications related to anesthesia, cardiovascular complications, and death.  In particular, I explained that the infection does appear to be in very close proximity to or even involve the major neurovascular structures anteromedially in the thigh, and these structures are therefore particularly at risk both from the infection and from a surgical standpoint.  She also understood the potential for continued pain, and that there were no guarantees of acceptable outcome.  After weighing these risks the patient opted to proceed with surgery.  TECHNIQUE: Patient was identified in the preoperative holding area.  The right thigh was confirmed as the appropriate operative site and marked by me.  Consent was signed by myself and the patient and witnessed by the preoperative nurse.  No block was performed by anesthesia in the preoperative holding area.  Patient was taken to the operative suite and placed supine on the operative table.  Anesthesia was induced by the anesthesia team.  The patient was positioned appropriately for the procedure and all bony prominences were well padded.  A tourniquet was not used.  Preoperative antibiotics were given. The  extremity was prepped and draped in the usual sterile fashion and surgical timeout was performed.  On preoperative imaging, the location of the majority of the infection on CT  was noted to be 8 cm lateral to the pubic symphysis.  This location was measured and marked, and a longitudinal curvilinear incision extending from this location down the thigh was marked on the skin.  Skin was incised sharply.  Underlying subcutaneous tissue was dissected with electrocautery, taking care to preserve cutaneous nerve wherever possible.  Purulence was encountered deep to the subcutaneous tissue, with extensive involvement extending into the anterior and adductor compartment musculature.  The tract of the infection was fully developed with careful blunt dissection.  The major neurovascular structures including femoral nerve and vessels were identified, dissected, carefully preserved throughout the procedure.  All devitalized infected tissue was resected with a combination of sharp dissection and Bovie electrocautery until only healthy appearing tissue was left.  All areas were thoroughly irrigated with copious normal saline, over 1 L.  The wound was then thoroughly packed with moist Kerlix gauze, and dressed with ABDs and Medipore tape.  Debridement type: Excisional Debridement Side: right Body Location: thigh  Tools used for debridement: scalpel and scissors Pre-debridement Wound size (cm):    Length: N/A        Width: N/A      Depth: N/A  Post-debridement Wound size (cm): Length: 35 cm        Width: 7 cm      Depth: 5 cm  Debridement depth beyond dead/damaged tissue down to healthy viable tissue: yes Tissue layer involved: skin, subcutaneous tissue, muscle / fascia Nature of tissue removed: Necrotic, Devitalized Tissue, Non-viable tissue, and Purulence Irrigation volume: >1L    Irrigation fluid type: Normal Saline Infection PRESENT AT THE TIME OF SURGERY (PATOS):  Yes I encountered purulence in the anterior  and adductor compartment musculature At the tissue level: Deep Incisional Primary (Subfascial but NOT bone or joint)   Subsequently Dr. Alvino Blood performed a debridement within the pelvis through a separate incision.  Please refer to his separate operative dictation for that portion of the case.  POSTOPERATIVE INSTRUCTIONS: DVT Prophylaxis: Per general surgery Further surgical plans: Plan is to return to the OR with general surgery for subsequent exploration and irrigation and repeat packing this coming week Disposition: Pending return to the OR with general surgery for repeat debridement and packing  TOURNIQUET TIME: * No tourniquets in log *  BLOOD LOSS: 150 mL         DRAINS: None         SPECIMEN:  Operative cultures of purulent fluid       COMPLICATIONS:  * No complications entered in OR log *         DISPOSITION: PACU - hemodynamically stable.         CONDITION: stable   Georgeanna Harrison M.D. Orthopaedic Surgery [facility]   Portions of the record have been created with voice recognition software.  Grammatical and punctuation errors, random word insertions, wrong-word or "sound-a-like" substitutions, pronoun errors (inaccuracies and/or substitutions), and/or incomplete sentences may have occurred due to the inherent limitations of voice recognition software.  Not all errors are caught or corrected.  Although every attempt is made to root out erroneous and incomplete transcription, the note may still not fully represent the intent or opinion of the author.  Read the chart carefully and recognize, using context, where errors/substitutions have occurred.  Any questions or concerns about the content of this note or information contained within the body of this dictation should be addressed directly with the author for clarification.

## 2022-06-18 NOTE — Progress Notes (Signed)
The patient was admitted to the unit in bed, transferred to our ICU bed. Uneventful transfer. Bedside report taken from RN Ailene Ards from PACU.   Called her husband who stated he would be up to the unit in approximately 30 minutes and who verified he has the patient's belongings including her purse.   Foley catheter inserted from strict I and O (in patient with acute in CKD) and right leg wound from necrotizing fasciitis.   Intake taken. MRSA swab done. Lab draw for f/u CBC from 3 units PRBCs prior to ICU admission. Antibiotics ordered.   Assessment completed, no significant findings. Patient c/o severe pain 8/10 right leg/groin without orders for medication.

## 2022-06-18 NOTE — ED Notes (Signed)
Hemoglobin 5.9. MD made aware.

## 2022-06-18 NOTE — ED Provider Notes (Signed)
[place] EMERGENCY DEPARTMENT Provider Note   CSN: [id number] Arrival date & time: 06/18/22  0310     History  Chief Complaint  Patient presents with   Flu-Like Symptoms   Fever    Jeronica Metzinger is a 41 y.o. female.   Fever Patient been feeling bad.  Had chief complaint of abdominal pain.  Has felt bad for the last couple days but states since earlier today she has severe pain in the right groin/abdomen area and cannot move the leg.  Has had previous infections in the right foot treated in August states that is also more swollen medial aspect.  States she cannot feel the leg.  States initially that the foot is always numb but now the whole leg is numb.      Past Medical History:  Diagnosis Date   Anemia    Anxiety    Chronic bronchitis (HCC)    Hypertension    Increased frequency of headaches    Morbid obesity ([place])    Sleep apnea    Type II diabetes mellitus [facility])    Past Surgical History:  Procedure Laterality Date   BARIATRIC SURGERY     BREAST REDUCTION SURGERY  03/21/2017   CARDIAC CATHETERIZATION  01/06/2016   CARDIAC CATHETERIZATION N/A 01/06/2016   Procedure: Left Heart Cath and Coronary Angiography;  Surgeon: Wellington Hampshire, MD;  Location: [place] CV LAB;  Service: Cardiovascular;  Laterality: N/A;   CESAREAN SECTION  08/2014   REDUCTION MAMMAPLASTY Bilateral 03/21/2017    Home Medications Prior to Admission medications   Medication Sig Start Date End Date Taking? Authorizing Provider  amLODipine (NORVASC) 10 MG tablet Take 1 tablet (10 mg total) by mouth daily. 07/08/19  Yes Nita Sells, MD  bumetanide (BUMEX) 1 MG tablet Take 1 mg by mouth every morning. 01/12/21  Yes [provider]  calcitRIOL (ROCALTROL) 0.25 MCG capsule Take 0.25 mcg by mouth daily.   Yes [provider]  carvedilol (COREG) 25 MG tablet Take 1 tablet (25 mg total) by mouth 2 (two) times daily with a meal. 05/06/18  Yes Lady Deutscher, MD  diclofenac Sodium (VOLTAREN) 1 % GEL Apply 2 g topically 4 (four) times daily as needed (pain).    Yes [provider]  folic acid (FOLVITE) 1 MG tablet Take 1 mg by mouth daily.   Yes [provider]  furosemide (LASIX) 40 MG tablet Take 40 mg by mouth daily as needed for fluid or edema. 06/23/19  Yes [provider]  hydrALAZINE (APRESOLINE) 10 MG tablet Take 10 mg by mouth daily.   Yes [provider]  methocarbamol (ROBAXIN) 500 MG tablet Take 500 mg by mouth every 6 (six) hours as needed for muscle spasms.   Yes [provider]  sertraline (ZOLOFT) 100 MG tablet Take 100 mg by mouth at bedtime. 04/30/19  Yes [provider]  Vitamin D, Ergocalciferol, (DRISDOL) 1.25 MG (50000 UNIT) CAPS capsule Take 50,000 Units by mouth every Thursday. 12/23/19  Yes [provider]  gentamicin cream (GARAMYCIN) 0.1 % Apply 1 Application topically 2 (two) times daily. Patient not taking: Reported on 06/18/2022 03/22/22   Edrick Kins, DPM  hydrALAZINE (APRESOLINE) 50 MG tablet Take 1 tablet (50 mg total) by mouth 3 (three) times daily. Patient not taking: Reported on 06/18/2022 07/07/19   Nita Sells, MD      Allergies    Patient has no known allergies.    Review of Systems  Review of Systems  Constitutional:  Positive for fever.    Physical Exam Updated Vital Signs BP (!) 142/87   Pulse (!) 109   Temp 99.2 F (37.3 C) (Oral)   Resp (!) 23   SpO2 100%  Physical Exam Vitals reviewed.  HENT:     Head: Normocephalic.  Eyes:     Pupils: Pupils are equal, round, and reactive to light.  Cardiovascular:     Rate and Rhythm: Regular rhythm.  Abdominal:     Tenderness: There is abdominal tenderness.     Comments: Moderate to severe tenderness right lower quadrant.  Musculoskeletal:     Cervical back: Neck supple.     Comments: Good pulse in right foot.  There is immediately some swelling without fluctuance or  induration.  Pain in right pelvis/groin with movement of lower extremity.  Patient is able to wiggle toes but otherwise cannot move the lower extremity.  Foot is still warm.  States no sensation on right lower leg and paresthesias on right thigh.  Skin:    Capillary Refill: Capillary refill takes less than 2 seconds.  Neurological:     Mental Status: She is oriented to person, place, and time.     ED Results / Procedures / Treatments   Labs (all labs ordered are listed, but only abnormal results are displayed) Labs Reviewed  CBC WITH DIFFERENTIAL/PLATELET - Abnormal; Notable for the following components:      Result Value   WBC 27.8 (*)    RBC 2.04 (*)    Hemoglobin 5.9 (*)    HCT 18.7 (*)    Neutro Abs 26.7 (*)    Lymphs Abs 0.6 (*)    All other components within normal limits  COMPREHENSIVE METABOLIC PANEL - Abnormal; Notable for the following components:   CO2 16 (*)    Glucose, Bld 171 (*)    BUN 61 (*)    Creatinine, Ser 5.57 (*)    Calcium 7.3 (*)    Total Protein 6.0 (*)    Albumin 1.7 (*)    GFR, Estimated 9 (*)    All other components within normal limits  I-STAT BETA HCG BLOOD, ED ([facility], [facility], [facility] ONLY) - Abnormal; Notable for the following components:   I-stat hCG, quantitative 7.5 (*)    All other components within normal limits  RESP PANEL BY RT-PCR (FLU A&B, COVID) ARPGX2  CULTURE, BLOOD (ROUTINE X 2)  CULTURE, BLOOD (ROUTINE X 2)  LACTIC ACID, PLASMA  LACTIC ACID, PLASMA  URINALYSIS, ROUTINE W REFLEX MICROSCOPIC  TYPE AND SCREEN  PREPARE RBC (CROSSMATCH)    EKG None  Radiology CT ABDOMEN PELVIS WO CONTRAST  Result Date: 06/18/2022 CLINICAL DATA:  41 year old female with history of right lower quadrant abdominal pain. Low hemoglobin. EXAM: CT ABDOMEN AND PELVIS WITHOUT CONTRAST TECHNIQUE: Multidetector CT imaging of the abdomen and pelvis was performed following the standard protocol without IV contrast. RADIATION DOSE REDUCTION: This exam was performed  according to the departmental dose-optimization program which includes automated exposure control, adjustment of the mA and/or kV according to patient size and/or use of iterative reconstruction technique. COMPARISON:  CT the abdomen and pelvis 08/27/2021. FINDINGS: Lower chest: Small amount of pericardial fluid and/or thickening, unlikely to be of hemodynamic significance at this time. Hepatobiliary: No suspicious cystic or solid hepatic lesions are confidently identified on today's noncontrast CT examination. Diffuse low attenuation throughout the hepatic parenchyma, indicative of a background of hepatic steatosis. Unenhanced appearance of the gallbladder is unremarkable. Pancreas:  No definite pancreatic mass or peripancreatic fluid collections or inflammatory changes are noted on today's noncontrast CT examination. Spleen: Unremarkable. Adrenals/Urinary Tract: Mild right hydroureteronephrosis. Unenhanced appearance of the kidneys is otherwise unremarkable bilaterally. Right adrenal gland is normal in appearance. 2.8 x 2.1 cm low-attenuation (-7 HU) left adrenal nodule, compatible with a benign adenoma. No left hydroureteronephrosis. Urinary bladder is unremarkable in appearance. 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 No pathologic dilatation of small bowel or colon. Vascular/Lymphatic: No atherosclerotic calcifications are noted in the abdominal aorta. Mild atherosclerotic calcifications are noted in the pelvic vasculature. No definite lymphadenopathy confidently identified in the abdomen or pelvis. Reproductive: Uterus and ovaries are unremarkable in appearance. Other: Mild diffuse body wall edema. Presacral edema. Trace volume of ascites. No pneumoperitoneum. Musculoskeletal: Along the right pelvic sidewall (axial image 70 of series 3 and coronal image 72 of series 4) there is a mass-like area measuring 6.8 x 6.0 x 7.8 cm which is internally low-attenuation, apparently  centered in the right psoas musculature, with extension into the adjacent soft tissues, exerting substantial mass effect, particularly on the distal third of the right ureter. Gas is present in the deep soft tissues of the anterior compartment of the right thigh, tracking cephalad along the right iliacus musculature. There are no aggressive appearing lytic or blastic lesions noted in the visualized portions of the skeleton. IMPRESSION: 1. Findings, as above, highly concerning for necrotizing fasciitis in the right thigh. Further evaluation with noncontrast CT of the right lower extremity should be considered to better evaluate the full extent of these findings. 2. Low-attenuation mass-like lesion along the right pelvic sidewall centered in the inferior aspect of the right psoas muscle, likely a large abscess. This exerts mass effect upon adjacent structures, most notably the distal third of the right ureter, associated with mild proximal right hydroureteronephrosis. 3. No definite spontaneous intraperitoneal or retroperitoneal hemorrhage confidently identified on today's examination to account for the patient's low hematocrit level. 4. Trace volume of ascites. 5. Diffuse body wall edema. 6. Small amount of pericardial fluid and/or thickening, unlikely to be of hemodynamic significance at this time. 7. Hepatic steatosis. 8. Left adrenal adenoma. 9. Additional incidental findings, as above. Critical Value/emergent results were called by telephone at the time of interpretation on 06/18/2022 at 9:58 am to provider [facility] , who verbally acknowledged these results. Electronically Signed   By: Vinnie Langton M.D.   On: 06/18/2022 10:02   DG Foot Complete Right  Result Date: 06/18/2022 CLINICAL DATA:  Foot swelling EXAM: RIGHT FOOT COMPLETE - 3 VIEW COMPARISON:  03/13/2022. FINDINGS: Disordered appearance of midfoot intertarsal configuration and bony fragmentation suggest Charcot joint changes. No definite  acute traumatic abnormality identified. No osteolytic or osteoblastic changes. There is soft tissue swelling of the mid to distal foot. Plantar calcaneal spur noted. A 9 mm radiopaque needle shaped structure identified in the plantar soft tissues which could be a foreign body. IMPRESSION: Midfoot Charcot joint changes. Persistent foreign body in the plantar soft tissues. Electronically Signed   By: Sammie Bench M.D.   On: 06/18/2022 09:29   DG Chest 2 View  Result Date: 06/18/2022 CLINICAL DATA:  fever EXAM: CHEST - 2 VIEW COMPARISON:  03/05/2022 FINDINGS: Cardiac silhouette enlarged. No evidence of pneumothorax or pleural effusion. No evidence of pulmonary edema. There are thoracic degenerative changes. IMPRESSION: Enlarged cardiac silhouette.  Lungs are clear. Electronically Signed   By: Sammie Bench M.D.   On: 06/18/2022 09:19    Procedures  Procedures    Medications Ordered in ED Medications  0.9 %  sodium chloride infusion (has no administration in time range)  0.9 % irrigation (POUR BTL) (1,000 mLs Irrigation Given 06/18/22 1208)  acetaminophen (TYLENOL) tablet 1,000 mg (1,000 mg Oral Given 06/18/22 0356)  HYDROmorphone (DILAUDID) injection 1 mg (1 mg Intravenous Given 06/18/22 0732)  ondansetron (ZOFRAN) injection 4 mg (4 mg Intravenous Given 06/18/22 0732)  sodium chloride 0.9 % bolus 500 mL (500 mLs Intravenous New Bag/Given 06/18/22 0732)  piperacillin-tazobactam (ZOSYN) IVPB 3.375 g (0 g Intravenous Stopped 06/18/22 1007)    ED Course/ Medical Decision Making/ A&P                           Medical Decision Making Amount and/or Complexity of Data Reviewed Labs: ordered. Radiology: ordered.  Risk Prescription drug management. Decision regarding hospitalization.   Patient with fever initially.  No cough.  Right lower quadrant pain and tenderness.  However cannot move right lower leg.  Differential diagnosis includes intra-abdominal abscess.  Also some swelling of right foot  with previous abscess.  No erythema or edema on the foot.  Has pain does go around to the back.  Potentially other pathology such as epidural abscess.  Will get noncontrast CT scan since has chronic kidney disease with creatinines that are normally around 4.  Will give antibiotics and get blood cultures and lactic acid.  I-STAT beta hCG is 7.5.  I considered this negative.  Denies possibility of pregnancy and if this were a urine pregnancy test it also would be negative at this level.  0833.  Patient now came back with severe white count elevation and also has anemia.  We will now activate code medical to try and get CT scan expedited.  0948 Now has some crepitance in right groin. Still no skin changes.  Discussed with Dr. Kieth Brightly from general surgery and Dr. Mable Fill from trauma surgery.  Will likely go to the OR.  With anemia and renal issues request medicine admission.  Will discuss with hospitalist.  CRITICAL CARE Performed by: Davonna Belling Total critical care time: 30 minutes Critical care time was exclusive of separately billable procedures and treating other patients. Critical care was necessary to treat or prevent imminent or life-threatening deterioration. Critical care was time spent personally by me on the following activities: development of treatment plan with patient and/or surrogate as well as nursing, discussions with consultants, evaluation of patient's response to treatment, examination of patient, obtaining history from patient or surrogate, ordering and performing treatments and interventions, ordering and review of laboratory studies, ordering and review of radiographic studies, pulse oximetry and re-evaluation of patient's condition.         Final Clinical Impression(s) / ED Diagnoses Final diagnoses:  Necrotizing fasciitis [facility])    Rx / DC Orders ED Discharge Orders     None         Davonna Belling, MD 06/18/22 1235

## 2022-06-18 NOTE — Transfer of Care (Signed)
Immediate Anesthesia Transfer of Care Note  Patient: Jacqueline [name]  Procedure(s) Performed: IRRIGATION AND DEBRIDEMENT RIGHT ABDOMEN AND THIGH (Right: Abdomen)  Patient Location: PACU  Anesthesia Type:General  Level of Consciousness: awake and patient cooperative  Airway & Oxygen Therapy: Patient Spontanous Breathing  Post-op Assessment: Report given to RN and Post -op Vital signs reviewed and stable  Post vital signs: Reviewed and stable  Last Vitals:  Vitals Value Taken Time  BP 138/84 06/18/22 1555  Temp    Pulse 89 06/18/22 1558  Resp 23 06/18/22 1558  SpO2 97 % 06/18/22 1557  Vitals shown include unvalidated device data.  Last Pain:  Vitals:   06/18/22 1330  TempSrc: Oral  PainSc:          Complications: No notable events documented.

## 2022-06-18 NOTE — Progress Notes (Signed)
Pharmacy Antibiotic Note  Jacqueline [name] is a 41 y.o. female admitted on 06/18/2022 with  Necrotizing fascitis .  Patient is s/p OR debridement 12/2. Pharmacy has been consulted for Meropenem dosing. Note acute on CKD.   Plan: Meropenem 1000 mg IV every 12 hours Linezolid per MD Monitor clinical progress, cultures/sensitivities, renal function, abx plan   Height: 5\' 5"  (165.1 cm) Weight: 82.6 kg (182 lb) IBW/kg (Calculated) : 57  Temp (24hrs), Avg:98.8 F (37.1 C), Min:97.7 F (36.5 C), Max:100.6 F (38.1 C)  Recent Labs  Lab 06/18/22 0750 06/18/22 0754  WBC 27.8*  --   CREATININE 5.57*  --   LATICACIDVEN  --  1.9    Estimated Creatinine Clearance: 14 mL/min (A) (by C-G formula based on SCr of 5.57 mg/dL (H)).    No Known Allergies  Antimicrobials this admission: 12/2 Zosyn x 1 12/2 Meropenem >> 12/2 Linezolid >>   Dose adjustments this admission:   Microbiology results: 12/2 BCx: sent 12/2 body fluid Cx: sent   Thank you for allowing [place] to participate in this patients care. Jens Som, PharmD 06/18/2022 5:30 PM  **Pharmacist phone directory can be found on [email or web address] listed under [place]**

## 2022-06-18 NOTE — Consult Note (Addendum)
[facility] Consult Note  [facility] [birth year]  [id number].    Requesting MD: Davonna Belling Chief Complaint/Reason for Consult: necrotizing infection  HPI:  Jacqueline [name] is a 41 y.o. female PMH HTN, anemia, CKD, and h/o gastric bypass in 2020 at [facility] who presented to [facility] today complaining of 2 days of worsening right groin/leg pain. States that it has gotten progressively worse to the point that she cannot ambulate and she is having difficulty moving the right leg. States that it feels like her right leg is asleep. Pain does radiate into her lower abdomen. Associated with nausea, vomiting, and fever up to 102. In the ED patient underwent CT abdomen/pelvis wo contrast which is concerning for necrotizing fasciitis in the right thigh; mass like area measuring 6.8 x 6.0 x 7.8 cm along the right pelvic sidewall centered in the right psoas musculature with extension into the adjacent soft tissues, exerting substantial mass effect, particularly on the distal third of the right ureter; gas is present in the deep soft tissues of the anterior compartment of the right thigh, tracking cephalad along the right iliacus musculature. CT LE pending. Lab work pertinent for WBC 27.8, lactic acid 1.9, Hgb 5.9, Cr 5.57. General surgery asked to see.  Anticoagulants: none Nonsmoker Denies alcohol or illicit drug use Employment: [employer] at home with husband and children   Family History  Problem Relation Age of Onset   Diabetes Mother    Hypertension Mother    Thyroid disease Mother    Kidney disease Maternal Grandmother    Diabetes Maternal Grandmother    Heart attack Other     Past Medical History:  Diagnosis Date   Anemia    Anxiety    Chronic bronchitis (HCC)    Hypertension    Increased frequency of headaches    Morbid obesity ([place])    Sleep apnea    Type II diabetes mellitus ([place])     Past Surgical History:  Procedure Laterality Date   BARIATRIC SURGERY      BREAST REDUCTION SURGERY  03/21/2017   CARDIAC CATHETERIZATION  01/06/2016   CARDIAC CATHETERIZATION N/A 01/06/2016   Procedure: Left Heart Cath and Coronary Angiography;  Surgeon: Wellington Hampshire, MD;  Location: [facility];  Service: Cardiovascular;  Laterality: N/A;   CESAREAN SECTION  08/2014   REDUCTION MAMMAPLASTY Bilateral 03/21/2017    Social History:  reports that she has never smoked. She has never used smokeless tobacco. She reports that she does not drink alcohol and does not use drugs.  Allergies: No Known Allergies  (Not in a hospital admission)   Prior to Admission medications   Medication Sig Start Date End Date Taking? Authorizing Provider  amLODipine (NORVASC) 10 MG tablet Take 1 tablet (10 mg total) by mouth daily. 07/08/19   Nita Sells, MD  aspirin 81 MG EC tablet aspirin 81 mg tablet,delayed release    [provider]  bumetanide (BUMEX) 1 MG tablet Take 1 mg by mouth every morning. 01/12/21   [provider]  calcitRIOL (ROCALTROL) 0.25 MCG capsule calcitriol 0.25 mcg capsule  TAKE 1 CAPSULE BY MOUTH EVERY DAY    [provider]  carvedilol (COREG) 25 MG tablet Take 1 tablet (25 mg total) by mouth 2 (two) times daily with a meal. 05/06/18   Lady Deutscher, MD  cyanocobalamin 1000 MCG tablet cyanocobalamin (vit B-12) 1,000 mcg tablet  Take 1 tablet every day by oral route.    [provider]  diclofenac Sodium (VOLTAREN) 1 % GEL Apply 2 g topically 4 (four) times daily as needed (pain).     [provider]  folic acid (FOLVITE) 1 MG tablet folic acid 1 mg tablet    [provider]  furosemide (LASIX) 40 MG tablet Take 40 mg by mouth daily as needed for fluid or edema. 06/23/19   [provider]  gabapentin (NEURONTIN) 100 MG capsule Take 100 mg by mouth in the morning.    [provider]  gentamicin cream (GARAMYCIN) 0.1 % Apply 1 Application topically 2 (two) times daily.  03/22/22   Edrick Kins, DPM  hydrALAZINE (APRESOLINE) 10 MG tablet Take by mouth.    [provider]  hydrALAZINE (APRESOLINE) 50 MG tablet Take 1 tablet (50 mg total) by mouth 3 (three) times daily. 07/07/19   Nita Sells, MD  methocarbamol (ROBAXIN) 500 MG tablet methocarbamol 500 mg tablet    [provider]  Misc Natural Products (AIRBORNE ELDERBERRY) CHEW Chew 2 tablets by mouth daily.    [provider]  mupirocin ointment (BACTROBAN) 2 % mupirocin 2 % topical ointment  APPLY TO AFFECTED AREA 3 TIMES A DAY    [provider]  sertraline (ZOLOFT) 100 MG tablet Take 100 mg by mouth at bedtime. 04/30/19   [provider]  Vitamin D, Ergocalciferol, (DRISDOL) 1.25 MG (50000 UNIT) CAPS capsule Take 50,000 Units by mouth every Thursday. 12/23/19   [provider]    Blood pressure 116/68, pulse 95, temperature 99.2 F (37.3 C), temperature source Oral, resp. rate (!) 21, SpO2 100 %. Physical Exam: General: pleasant, WD/WN female who is laying in bed in NAD HEENT: head is normocephalic, atraumatic.  Sclera are noninjected.  Pupils equal and round.  Ears and nose without any masses or lesions.  Mouth is pink and moist. Dentition fair Heart: mild tachycardia, HR 100s. Palpable pedal pulses bilaterally  Lungs: CTAB, no wheezes, rhonchi, or rales noted.  Respiratory effort nonlabored on room air Abd: soft, ND, +BS, no masses, hernias, or organomegaly. Moderate right lower abdominal tenderness without peritonitis Skin: warm and dry with no masses, lesions, or rashes Psych: A&Ox4 with an appropriate affect RLE: foot WWP with good pedal pulse, significant edema of the entire RLE compared to the left. Decreased global sensation but she is able to wiggle the toes. Compartments soft and compressible. No overlying skin changes or area or induration or fluctuance  Results for orders placed or performed during the hospital encounter of 06/18/22  (from the past 48 hour(s))  Resp Panel by RT-PCR (Flu A&B, Covid) Anterior Nasal Swab     Status: None   Collection Time: 06/18/22  3:31 AM   Specimen: Anterior Nasal Swab  Result Value Ref Range   SARS Coronavirus 2 by RT PCR NEGATIVE NEGATIVE    Comment: (NOTE) SARS-CoV-2 target nucleic acids are NOT DETECTED.  The SARS-CoV-2 RNA is generally detectable in upper respiratory specimens during the acute phase of infection. The lowest concentration of SARS-CoV-2 viral copies this assay can detect is 138 copies/mL. A negative result does not preclude SARS-Cov-2 infection and should not be used as the sole basis for treatment or other patient management decisions. A negative result may occur with  improper specimen collection/handling, submission of specimen other than nasopharyngeal swab, presence of viral mutation(s) within the areas targeted by this assay, and inadequate number of viral copies(<138 copies/mL). A negative result must be combined with clinical observations, patient history, and epidemiological information.  The expected result is Negative.  Fact Sheet for Patients:  EntrepreneurPulse.com.au  Fact Sheet for Healthcare Providers:  IncredibleEmployment.be  This test is no t yet approved or cleared by the [place] FDA and  has been authorized for detection and/or diagnosis of SARS-CoV-2 by FDA under an Emergency Use Authorization (EUA). This EUA will remain  in effect (meaning this test can be used) for the duration of the COVID-19 declaration under Section 564(b)(1) of the Act, 21 U.S.C.section 360bbb-3(b)(1), unless the authorization is terminated  or revoked sooner.       Influenza A by PCR NEGATIVE NEGATIVE   Influenza B by PCR NEGATIVE NEGATIVE    Comment: (NOTE) The Xpert Xpress SARS-CoV-2/FLU/RSV plus assay is intended as an aid in the diagnosis of influenza from Nasopharyngeal swab specimens and should not be used as  a sole basis for treatment. Nasal washings and aspirates are unacceptable for Xpert Xpress SARS-CoV-2/FLU/RSV testing.  Fact Sheet for Patients: EntrepreneurPulse.com.au  Fact Sheet for Healthcare Providers: IncredibleEmployment.be  This test is not yet approved or cleared by the [place] FDA and has been authorized for detection and/or diagnosis of SARS-CoV-2 by FDA under an Emergency Use Authorization (EUA). This EUA will remain in effect (meaning this test can be used) for the duration of the COVID-19 declaration under Section 564(b)(1) of the Act, 21 U.S.C. section 360bbb-3(b)(1), unless the authorization is terminated or revoked.  Performed at [facility], [place] [address] [place], [place] [address]   CBC with Differential     Status: Abnormal   Collection Time: 06/18/22  7:50 AM  Result Value Ref Range   WBC 27.8 (H) 4.0 - 10.5 K/uL   RBC 2.04 (L) 3.87 - 5.11 MIL/uL   Hemoglobin 5.9 (LL) 12.0 - 15.0 g/dL    Comment: REPEATED TO VERIFY THIS CRITICAL RESULT HAS VERIFIED AND BEEN CALLED TO CHLOE YANG RN BY MELISSA BOOSTEDT ON 12 02 2023 AT 0827, AND HAS BEEN READ BACK.     HCT 18.7 (L) 36.0 - 46.0 %   MCV 91.7 80.0 - 100.0 fL   MCH 28.9 26.0 - 34.0 pg   MCHC 31.6 30.0 - 36.0 g/dL   RDW 14.0 11.5 - 15.5 %   Platelets 353 150 - 400 K/uL   nRBC 0.0 0.0 - 0.2 %   Neutrophils Relative % 96 %   Neutro Abs 26.7 (H) 1.7 - 7.7 K/uL   Lymphocytes Relative 2 %   Lymphs Abs 0.6 (L) 0.7 - 4.0 K/uL   Monocytes Relative 2 %   Monocytes Absolute 0.6 0.1 - 1.0 K/uL   Eosinophils Relative 0 %   Eosinophils Absolute 0.0 0.0 - 0.5 K/uL   Basophils Relative 0 %   Basophils Absolute 0.0 0.0 - 0.1 K/uL   WBC Morphology See Note     Comment: Increased Bands. >20% Bands   nRBC 0 0 /100 WBC   Abs Immature Granulocytes 0.00 0.00 - 0.07 K/uL    Comment: Performed at [facility], [place] [address], [place], [place] [address]  Comprehensive  metabolic panel     Status: Abnormal   Collection Time: 06/18/22  7:50 AM  Result Value Ref Range   Sodium 138 135 - 145 mmol/L   Potassium 4.1 3.5 - 5.1 mmol/L   Chloride 111 98 - 111 mmol/L   CO2 16 (L) 22 - 32 mmol/L   Glucose, Bld 171 (H) 70 - 99 mg/dL    Comment: Glucose reference range applies  only to samples taken after fasting for at least 8 hours.   BUN 61 (H) 6 - 20 mg/dL   Creatinine, Ser 5.57 (H) 0.44 - 1.00 mg/dL   Calcium 7.3 (L) 8.9 - 10.3 mg/dL   Total Protein 6.0 (L) 6.5 - 8.1 g/dL   Albumin 1.7 (L) 3.5 - 5.0 g/dL   AST 23 15 - 41 U/L   ALT 21 0 - 44 U/L   Alkaline Phosphatase 71 38 - 126 U/L   Total Bilirubin 0.7 0.3 - 1.2 mg/dL   GFR, Estimated 9 (L) >60 mL/min    Comment: (NOTE) Calculated using the CKD-EPI Creatinine Equation (2021)    Anion gap 11 5 - 15    Comment: Performed at [facility], [place] [address] [place], [place] [address]  Lactic acid, plasma     Status: None   Collection Time: 06/18/22  7:54 AM  Result Value Ref Range   Lactic Acid, Venous 1.9 0.5 - 1.9 mmol/L    Comment: Performed at [facility] [address] [place], [place] [address]  I-Stat beta hCG blood, ED     Status: Abnormal   Collection Time: 06/18/22  7:58 AM  Result Value Ref Range   I-stat hCG, quantitative 7.5 (H) <5 mIU/mL   Comment 3            Comment:   GEST. AGE      CONC.  (mIU/mL)   <=1 WEEK        5 - 50     2 WEEKS       50 - 500     3 WEEKS       100 - 10,000     4 WEEKS     1,000 - 30,000        FEMALE AND NON-PREGNANT FEMALE:     LESS THAN 5 mIU/mL    CT ABDOMEN PELVIS WO CONTRAST  Result Date: 06/18/2022 CLINICAL DATA:  41 year old female with history of right lower quadrant abdominal pain. Low hemoglobin. EXAM: CT ABDOMEN AND PELVIS WITHOUT CONTRAST TECHNIQUE: Multidetector CT imaging of the abdomen and pelvis was performed following the standard protocol without IV contrast. RADIATION DOSE REDUCTION: This exam was performed according to the  departmental dose-optimization program which includes automated exposure control, adjustment of the mA and/or kV according to patient size and/or use of iterative reconstruction technique. COMPARISON:  CT the abdomen and pelvis 08/27/2021. FINDINGS: Lower chest: Small amount of pericardial fluid and/or thickening, unlikely to be of hemodynamic significance at this time. Hepatobiliary: No suspicious cystic or solid hepatic lesions are confidently identified on today's noncontrast CT examination. Diffuse low attenuation throughout the hepatic parenchyma, indicative of a background of hepatic steatosis. Unenhanced appearance of the gallbladder is unremarkable. Pancreas: No definite pancreatic mass or peripancreatic fluid collections or inflammatory changes are noted on today's noncontrast CT examination. Spleen: Unremarkable. Adrenals/Urinary Tract: Mild right hydroureteronephrosis. Unenhanced appearance of the kidneys is otherwise unremarkable bilaterally. Right adrenal gland is normal in appearance. 2.8 x 2.1 cm low-attenuation (-7 HU) left adrenal nodule, compatible with a benign adenoma. No left hydroureteronephrosis. Urinary bladder is unremarkable in appearance. 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 No pathologic dilatation of small bowel or colon. Vascular/Lymphatic: No atherosclerotic calcifications are noted in the abdominal aorta. Mild atherosclerotic calcifications are noted in the pelvic vasculature. No definite lymphadenopathy confidently identified in the abdomen or pelvis. Reproductive: Uterus and ovaries are unremarkable in appearance. Other: Mild diffuse  body wall edema. Presacral edema. Trace volume of ascites. No pneumoperitoneum. Musculoskeletal: Along the right pelvic sidewall (axial image 70 of series 3 and coronal image 72 of series 4) there is a mass-like area measuring 6.8 x 6.0 x 7.8 cm which is internally low-attenuation, apparently centered in the right  psoas musculature, with extension into the adjacent soft tissues, exerting substantial mass effect, particularly on the distal third of the right ureter. Gas is present in the deep soft tissues of the anterior compartment of the right thigh, tracking cephalad along the right iliacus musculature. There are no aggressive appearing lytic or blastic lesions noted in the visualized portions of the skeleton. IMPRESSION: 1. Findings, as above, highly concerning for necrotizing fasciitis in the right thigh. Further evaluation with noncontrast CT of the right lower extremity should be considered to better evaluate the full extent of these findings. 2. Low-attenuation mass-like lesion along the right pelvic sidewall centered in the inferior aspect of the right psoas muscle, likely a large abscess. This exerts mass effect upon adjacent structures, most notably the distal third of the right ureter, associated with mild proximal right hydroureteronephrosis. 3. No definite spontaneous intraperitoneal or retroperitoneal hemorrhage confidently identified on today's examination to account for the patient's low hematocrit level. 4. Trace volume of ascites. 5. Diffuse body wall edema. 6. Small amount of pericardial fluid and/or thickening, unlikely to be of hemodynamic significance at this time. 7. Hepatic steatosis. 8. Left adrenal adenoma. 9. Additional incidental findings, as above. Critical Value/emergent results were called by telephone at the time of interpretation on 06/18/2022 at 9:58 am to provider [facility] , who verbally acknowledged these results. Electronically Signed   By: Vinnie Langton M.D.   On: 06/18/2022 10:02   DG Foot Complete Right  Result Date: 06/18/2022 CLINICAL DATA:  Foot swelling EXAM: RIGHT FOOT COMPLETE - 3 VIEW COMPARISON:  03/13/2022. FINDINGS: Disordered appearance of midfoot intertarsal configuration and bony fragmentation suggest Charcot joint changes. No definite acute traumatic  abnormality identified. No osteolytic or osteoblastic changes. There is soft tissue swelling of the mid to distal foot. Plantar calcaneal spur noted. A 9 mm radiopaque needle shaped structure identified in the plantar soft tissues which could be a foreign body. IMPRESSION: Midfoot Charcot joint changes. Persistent foreign body in the plantar soft tissues. Electronically Signed   By: Sammie Bench M.D.   On: 06/18/2022 09:29   DG Chest 2 View  Result Date: 06/18/2022 CLINICAL DATA:  fever EXAM: CHEST - 2 VIEW COMPARISON:  03/05/2022 FINDINGS: Cardiac silhouette enlarged. No evidence of pneumothorax or pleural effusion. No evidence of pulmonary edema. There are thoracic degenerative changes. IMPRESSION: Enlarged cardiac silhouette.  Lungs are clear. Electronically Signed   By: Sammie Bench M.D.   On: 06/18/2022 09:19    Anti-infectives (From admission, onward)    Start     Dose/Rate Route Frequency Ordered Stop   06/18/22 0745  piperacillin-tazobactam (ZOSYN) IVPB 3.375 g        3.375 g 100 mL/hr over 30 Minutes Intravenous  Once 06/18/22 3810 06/18/22 0835        Assessment/Plan Right groin/thigh necrotizing fasciitis  - CT abdomen/pelvis wo contrast concerning for necrotizing fasciitis in the right thigh; mass like area measuring 6.8 x 6.0 x 7.8 cm along the right pelvic sidewall centered in the right psoas musculature with extension into the adjacent soft tissues, exerting substantial mass effect, particularly on the distal third of the right ureter; gas is present in the deep soft tissues  of the anterior compartment of the right thigh, tracking cephalad along the right iliacus musculature. CT LE pending. - Recommend medical admission, broad spectrum antibiotics. Plan to take to the OR today for debridement. Keep NPO. 2u PRBCs have been ordered.  ID - zosyn x1 in ED VTE - SCDs FEN - NPO Foley - none  Acute on CKD Acute on chronic anemia HTN H/o gastric bypass in 2020 at  [facility]  I reviewed ED provider notes, last 24 h vitals and pain scores, last 48 h intake and output, last 24 h labs and trends, and last 24 h imaging results.   Wellington Hampshire, [facility] 06/18/2022, 10:20 AM Please see Amion for pager number during day hours 7:00am-4:30pm

## 2022-06-18 NOTE — ED Notes (Signed)
Pt in bed with sheet pulled over head. Appears to be resting.

## 2022-06-18 NOTE — Anesthesia Procedure Notes (Signed)
Procedure Name: Intubation Date/Time: 06/18/2022 2:20 PM  Performed by: Renato Shin, CRNAPre-anesthesia Checklist: Patient identified, Emergency Drugs available, Suction available and Patient being monitored Patient Re-evaluated:Patient Re-evaluated prior to induction Oxygen Delivery Method: Circle system utilized Preoxygenation: Pre-oxygenation with 100% oxygen Induction Type: IV induction, Rapid sequence and Cricoid Pressure applied Laryngoscope Size: Miller and 3 Grade View: Grade I Tube type: Oral Tube size: 7.0 mm Number of attempts: 1 Airway Equipment and Method: Stylet and Oral airway Placement Confirmation: ETT inserted through vocal cords under direct vision, positive ETCO2 and breath sounds checked- equal and bilateral Tube secured with: Tape Dental Injury: Teeth and Oropharynx as per pre-operative assessment

## 2022-06-18 NOTE — Consult Note (Signed)
Orthopaedic Consult  Date/Time: 06/18/22 10:59 AM  Patient Name: Jacqueline [name]  Attending Physician: Davonna Belling, MD    ASSESSMENT & PLAN  Orthopaedic Assessment: 41 y.o. female with right psoas abscess and pelvic necrotizing fasciitis with extension into right thigh.  Plan: Patient discussed extensively with general surgery team.  Given concern for evolving necrotizing soft tissue infection extending out of the pelvis into the thigh, plan is to proceed to OR on emergent basis for debridement in order to decrease the infection burden.  The nature of this infection was thoroughly discussed with the patient, including the possibility of need for subsequent surgeries which may involve amputation, possibly hip disarticulation.  I explained that this would be very major and extensive surgery, and would likely require transfer to another facility given the subsequent need for extensive soft tissue coverage after such procedure.  I also explained that necrotizing infections may rapidly progressed, and in some cases may result in death.  Plan to proceed to OR for debridement today as soon as possible.  Continue broad-spectrum antibiotics.  Remainder of care per primary team.   Georgeanna Harrison M.D. Orthopaedic Surgery [facility]   Medical Decision Making  Amount/complexity of data: Is there a current pathologic fracture (e.g. neoplastic, osteoporotic insufficiency fracture)? No Independent interpretation of radiographic studies: Yes Review of radiology results (e.g. reports): Yes Tests ordered (e.g. additional radiographic studies, labs): No Lab results reviewed: Yes Reviewed old records: Yes History from another source (independent historian, e.g. family/friend/etc.): No Discussion of imaging, clinical data, and or management with independent medical provider: Yes Risk: Patient receiving IV controlled substances for pain: Yes Fracture requiring manipulation:  No Urgent or emergent (non-elective) surgery likely this admission: Yes Presence of medical comorbidities and/or surgical risk factors (e.g. current smoker, CAD, diabetes, COPD, CKD, etc.): Yes Closed fracture management WITHOUT manipulation: No Urgent minor procedure (e.g. joint aspiration, compartment pressure measurement, etc.): No Will likely need surgery as an outpatient: No     HPI Jacqueline [name] is a 41 y.o. female. Orthopaedic consultation has specifically been requested to address this patient's current musculoskeletal presentation.  She reports several days of pelvic and right groin pain associated with progressive loss of sensation in the right lower extremity as well as weakness.  This been associated with fevers.  This gotten progressively worse to the point that she could no longer ambulate.   PMH Past Medical History:  Diagnosis Date   Anemia    Anxiety    Chronic bronchitis (HCC)    Hypertension    Increased frequency of headaches    Morbid obesity ([place])    Sleep apnea    Type II diabetes mellitus ([place])      [place] Past Surgical History:  Procedure Laterality Date   BARIATRIC SURGERY     BREAST REDUCTION SURGERY  03/21/2017   CARDIAC CATHETERIZATION  01/06/2016   CARDIAC CATHETERIZATION N/A 01/06/2016   Procedure: Left Heart Cath and Coronary Angiography;  Surgeon: Wellington Hampshire, MD;  Location: [facility];  Service: Cardiovascular;  Laterality: N/A;   CESAREAN SECTION  08/2014   REDUCTION MAMMAPLASTY Bilateral 03/21/2017   Home Medications Prior to Admission medications   Medication Sig Start Date End Date Taking? Authorizing Provider  amLODipine (NORVASC) 10 MG tablet Take 1 tablet (10 mg total) by mouth daily. 07/08/19   Nita Sells, MD  aspirin 81 MG EC tablet aspirin 81 mg tablet,delayed release    [provider]  bumetanide (BUMEX) 1 MG tablet  Take 1 mg by mouth every morning. 01/12/21   [provider]  calcitRIOL  (ROCALTROL) 0.25 MCG capsule calcitriol 0.25 mcg capsule  TAKE 1 CAPSULE BY MOUTH EVERY DAY    [provider]  carvedilol (COREG) 25 MG tablet Take 1 tablet (25 mg total) by mouth 2 (two) times daily with a meal. 05/06/18   Lady Deutscher, MD  cyanocobalamin 1000 MCG tablet cyanocobalamin (vit B-12) 1,000 mcg tablet  Take 1 tablet every day by oral route.    [provider]  diclofenac Sodium (VOLTAREN) 1 % GEL Apply 2 g topically 4 (four) times daily as needed (pain).     [provider]  folic acid (FOLVITE) 1 MG tablet folic acid 1 mg tablet    [provider]  furosemide (LASIX) 40 MG tablet Take 40 mg by mouth daily as needed for fluid or edema. 06/23/19   [provider]  gabapentin (NEURONTIN) 100 MG capsule Take 100 mg by mouth in the morning.    [provider]  gentamicin cream (GARAMYCIN) 0.1 % Apply 1 Application topically 2 (two) times daily. 03/22/22   Edrick Kins, DPM  hydrALAZINE (APRESOLINE) 10 MG tablet Take by mouth.    [provider]  hydrALAZINE (APRESOLINE) 50 MG tablet Take 1 tablet (50 mg total) by mouth 3 (three) times daily. 07/07/19   Nita Sells, MD  methocarbamol (ROBAXIN) 500 MG tablet methocarbamol 500 mg tablet    [provider]  Misc Natural Products (AIRBORNE ELDERBERRY) CHEW Chew 2 tablets by mouth daily.    [provider]  mupirocin ointment (BACTROBAN) 2 % mupirocin 2 % topical ointment  APPLY TO AFFECTED AREA 3 TIMES A DAY    [provider]  sertraline (ZOLOFT) 100 MG tablet Take 100 mg by mouth at bedtime. 04/30/19   [provider]  Vitamin D, Ergocalciferol, (DRISDOL) 1.25 MG (50000 UNIT) CAPS capsule Take 50,000 Units by mouth every Thursday. 12/23/19   [provider]     Allergies No Known Allergies   Family History Family History  Problem Relation Age of Onset   Diabetes Mother    Hypertension Mother    Thyroid disease  Mother    Kidney disease Maternal Grandmother    Diabetes Maternal Grandmother    Heart attack Other     Social History Social History   Socioeconomic History   Marital status: Married    Spouse name: Not on file   Number of children: Not on file   Years of education: Not on file   Highest education level: Not on file  Occupational History   Not on file  Tobacco Use   Smoking status: Never   Smokeless tobacco: Never  Vaping Use   Vaping Use: Never used  Substance and Sexual Activity   Alcohol use: No   Drug use: No   Sexual activity: Yes    Birth control/protection: None  Other Topics Concern   Not on file  Social History Narrative   Not on file   Social Determinants of Health   Financial Resource Strain: Not on file  Food Insecurity: No Food Insecurity (02/11/2022)   Hunger Vital Sign    Worried About Running Out of Food in the [name] Year: Never true    Ran Out of Food in the [name] Year: Never true  Transportation Needs: No Transportation Needs (02/11/2022)   PRAPARE - Transportation    Lack of Transportation (Medical): No    Lack of  Transportation (Non-Medical): No  Physical Activity: Not on file  Stress: Not on file  Social Connections: Not on file  Intimate Partner Violence: Not on file     Review of Systems MSK: As noted per HPI above GI: No current Nausea/vomiting ENT: Denies sore throat, epistaxis CV: Denies chest pain  Resp: No current shortness of breath  Other than mentioned above, there are no Constitutional, Neurological, Psychiatric, ENT, Ophthalmological, Cardiovascular, Respiratory, GI, GU, Musculoskeletal, Integumentary, Lymphatic, Endocrine or Allergic issues.     Imaging  Independent interpretation of orthopaedic-relevant films: CTs of the abdomen and pelvis and right lower extremity were independently reviewed.  These studies reveal a pelvic abscess involving the psoas musculature, with soft tissue gas tracking down along the psoas musculature  down into the anterior and abductor compartment of the thigh, involving the neurovascular structures, and extending along the abductor musculature to close to the level of the knee.  Radiographic results: CT ABDOMEN PELVIS WO CONTRAST  Result Date: 06/18/2022 CLINICAL DATA:  41 year old female with history of right lower quadrant abdominal pain. Low hemoglobin. EXAM: CT ABDOMEN AND PELVIS WITHOUT CONTRAST TECHNIQUE: Multidetector CT imaging of the abdomen and pelvis was performed following the standard protocol without IV contrast. RADIATION DOSE REDUCTION: This exam was performed according to the departmental dose-optimization program which includes automated exposure control, adjustment of the mA and/or kV according to patient size and/or use of iterative reconstruction technique. COMPARISON:  CT the abdomen and pelvis 08/27/2021. FINDINGS: Lower chest: Small amount of pericardial fluid and/or thickening, unlikely to be of hemodynamic significance at this time. Hepatobiliary: No suspicious cystic or solid hepatic lesions are confidently identified on today's noncontrast CT examination. Diffuse low attenuation throughout the hepatic parenchyma, indicative of a background of hepatic steatosis. Unenhanced appearance of the gallbladder is unremarkable. Pancreas: No definite pancreatic mass or peripancreatic fluid collections or inflammatory changes are noted on today's noncontrast CT examination. Spleen: Unremarkable. Adrenals/Urinary Tract: Mild right hydroureteronephrosis. Unenhanced appearance of the kidneys is otherwise unremarkable bilaterally. Right adrenal gland is normal in appearance. 2.8 x 2.1 cm low-attenuation (-7 HU) left adrenal nodule, compatible with a benign adenoma. No left hydroureteronephrosis. Urinary bladder is unremarkable in appearance. 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 No pathologic dilatation of small bowel or colon. Vascular/Lymphatic: No  atherosclerotic calcifications are noted in the abdominal aorta. Mild atherosclerotic calcifications are noted in the pelvic vasculature. No definite lymphadenopathy confidently identified in the abdomen or pelvis. Reproductive: Uterus and ovaries are unremarkable in appearance. Other: Mild diffuse body wall edema. Presacral edema. Trace volume of ascites. No pneumoperitoneum. Musculoskeletal: Along the right pelvic sidewall (axial image 70 of series 3 and coronal image 72 of series 4) there is a mass-like area measuring 6.8 x 6.0 x 7.8 cm which is internally low-attenuation, apparently centered in the right psoas musculature, with extension into the adjacent soft tissues, exerting substantial mass effect, particularly on the distal third of the right ureter. Gas is present in the deep soft tissues of the anterior compartment of the right thigh, tracking cephalad along the right iliacus musculature. There are no aggressive appearing lytic or blastic lesions noted in the visualized portions of the skeleton. IMPRESSION: 1. Findings, as above, highly concerning for necrotizing fasciitis in the right thigh. Further evaluation with noncontrast CT of the right lower extremity should be considered to better evaluate the full extent of these findings. 2. Low-attenuation mass-like lesion along the right pelvic sidewall centered in the inferior aspect of the right psoas muscle,  likely a large abscess. This exerts mass effect upon adjacent structures, most notably the distal third of the right ureter, associated with mild proximal right hydroureteronephrosis. 3. No definite spontaneous intraperitoneal or retroperitoneal hemorrhage confidently identified on today's examination to account for the patient's low hematocrit level. 4. Trace volume of ascites. 5. Diffuse body wall edema. 6. Small amount of pericardial fluid and/or thickening, unlikely to be of hemodynamic significance at this time. 7. Hepatic steatosis. 8. Left  adrenal adenoma. 9. Additional incidental findings, as above. Critical Value/emergent results were called by telephone at the time of interpretation on 06/18/2022 at 9:58 am to provider [facility] , who verbally acknowledged these results. Electronically Signed   By: Vinnie Langton M.D.   On: 06/18/2022 10:02   DG Foot Complete Right  Result Date: 06/18/2022 CLINICAL DATA:  Foot swelling EXAM: RIGHT FOOT COMPLETE - 3 VIEW COMPARISON:  03/13/2022. FINDINGS: Disordered appearance of midfoot intertarsal configuration and bony fragmentation suggest Charcot joint changes. No definite acute traumatic abnormality identified. No osteolytic or osteoblastic changes. There is soft tissue swelling of the mid to distal foot. Plantar calcaneal spur noted. A 9 mm radiopaque needle shaped structure identified in the plantar soft tissues which could be a foreign body. IMPRESSION: Midfoot Charcot joint changes. Persistent foreign body in the plantar soft tissues. Electronically Signed   By: Sammie Bench M.D.   On: 06/18/2022 09:29   DG Chest 2 View  Result Date: 06/18/2022 CLINICAL DATA:  fever EXAM: CHEST - 2 VIEW COMPARISON:  03/05/2022 FINDINGS: Cardiac silhouette enlarged. No evidence of pneumothorax or pleural effusion. No evidence of pulmonary edema. There are thoracic degenerative changes. IMPRESSION: Enlarged cardiac silhouette.  Lungs are clear. Electronically Signed   By: Sammie Bench M.D.   On: 06/18/2022 09:19   Labs  Recent Labs    06/18/22 0750  WBC 27.8*  HGB 5.9*  HCT 18.7*  PLT 353   Recent Labs    06/18/22 0750  NA 138  K 4.1  CL 111  CO2 16*  BUN 61*  CREATININE 5.57*  GLUCOSE 171*  CALCIUM 7.3*   Lab Results  Component Value Date   INR 1.0 03/13/2022   INR 1.1 08/17/2020   INR 1.0 02/03/2020        Physical Examination  Patient is a 41 y.o. year old female who is in mild to moderate distress, ill-appearing, and crying, mood is alert.  Orientation: oriented  to person, place, time, and general circumstances  Vital Signs: BP 116/68   Pulse 98   Temp 99.2 F (37.3 C) (Oral)   Resp 17   SpO2 100%    Gait: Unable to ambulate.  Supine on stretcher.  Heart: Normal rate Lungs: Non-labored breathing Abdomen: Soft, Non-tender   Right Upper Extremity: Inspection: Atraumatic Palpation: Nontender ROM: Full, painless Strength: Normal Sensation: Intact to light touch distally Skin: Intact Peripheral Vascular: Well perfused Joint Stability: No instability Reflexes: No pathologic Lymph Nodes: None Palpable Coordination: Intact, normal   Left Upper Extremity: Inspection: Atraumatic Palpation: Nontender ROM: Full, painless Strength: Normal Sensation: Intact to light touch distally Skin: Intact Peripheral Vascular: Well perfused Joint Stability: No instability Reflexes: No pathologic Lymph Nodes: None Palpable Coordination: Intact, normal    Right Lower Extremity: Inspection: Atraumatic appearance, no skin discoloration Palpation: Tender to deep palpation proximally near the groin, but otherwise no pain to palpation due to lack of sensation ROM: Discomfort with hip range of motion; unable to actively range any aspects of right lower  extremity Strength: Unable to maintain straight leg raise, flex the hip, or demonstrate DF/PF/EHL Sensation: Globally diminished to light touch throughout right lower extremity Skin: Intact without blistering or discoloration Peripheral Vascular: Normal DP pulse, warm and well-perfused distally Joint Stability: Knee stable to varus and valgus stress Reflexes: No pathologic Lymph Nodes: None Palpable Coordination: Limited by pain and injury   Left Lower Extremity: Inspection: Atraumatic Palpation: Nontender ROM: Full, painless Strength: Normal Sensation: Intact to light touch distally Skin: Intact Peripheral Vascular: Well perfused Joint Stability: No instability Reflexes: No pathologic Lymph Nodes:  None Palpable Coordination: Intact, normal    Pelvis: Skin: Intact Palpation: Tender to deep palpation on the right side Stability: No instability      The review of the patient's medications does not in any way constitute an endorsement, by this clinician,  of their use, dosage, indications, route, efficacy, interactions, or other clinical parameters.  This note was generated within the [employer] EMR using Dragon medical speech recognition software and may contain inherent errors or omissions not intended by the user. Grammatical and punctuation errors, random word insertions, deletions, pronoun errors and incomplete sentences are occasional consequences of this technology due to software limitations. Not all errors are caught or corrected.  Although every attempt is made to root out erroneus and incomplete transcription, the note may still not fully represent the intent or opinion of the author. If there are questions or concerns about the content of this note or information contained within the body of this dictation they should be addressed directly with the author for clarification.

## 2022-06-18 NOTE — ED Provider Triage Note (Addendum)
Emergency Medicine Provider Triage Evaluation Note  Jacqueline [name] , a 41 y.o. female  was evaluated in triage.  Pt complains of flu like symptoms x4 days.  Also reports ongoing issues with right foot-- following with podiatry for questionable FB in the foot.  States some increased swelling now.  Denies new injury/trauma.  Review of Systems  Positive: Flu like symptoms, foot pain Negative: fever  Physical Exam  BP (!) 136/94 (BP Location: Right Arm)   Pulse 77   Temp (!) 100.6 F (38.1 C) (Oral)   Resp 18   SpO2 100%   Gen:   Awake, no distress   Resp:  Normal effort  MSK:   Moves extremities without difficulty  Other:  Swelling noted to right foot without overlying erythema/induration present  Medical Decision Making  Medically screening exam initiated at 3:30 AM.  Appropriate orders placed.  Jacqueline [name] was informed that the remainder of the evaluation will be completed by another provider, this initial triage assessment does not replace that evaluation, and the importance of remaining in the ED until their evaluation is complete.  Flu like symptoms x4 days.  Febrile in triage.  Covid/flu screen sent.  Tylenol given for fever.   Jacqueline Pickett, PA-C 06/18/22 0332    Jacqueline Pickett, PA-C 06/18/22 [phone]

## 2022-06-18 NOTE — Anesthesia Preprocedure Evaluation (Addendum)
Anesthesia Evaluation  Patient identified by MRN, date of birth, ID band Patient awake    Reviewed: Allergy & Precautions, NPO status , Patient's Chart, lab work & pertinent test results, reviewed documented beta blocker date and time   History of Anesthesia Complications Negative for: history of anesthetic complications  Airway Mallampati: III  TM Distance: >3 FB Neck ROM: Full    Dental  (+) Dental Advisory Given, Missing   Pulmonary sleep apnea    Pulmonary exam normal        Cardiovascular hypertension, Pt. on medications and Pt. on home beta blockers Normal cardiovascular exam+ dysrhythmias Atrial Fibrillation    '20 TTE - EF 60 to 65%. Left ventricular septal wall thickness was moderately increased. Moderately increased left ventricular posterior wall thickness. There is severely increased left ventricular hypertrophy of the basal septum. Grade II diastolic dysfunction (pseudonormalization). Left atrial size was mildly dilated.     Neuro/Psych  Headaches PSYCHIATRIC DISORDERS Anxiety Depression     Neuromuscular disease    GI/Hepatic Neg liver ROS,,, S/p gastric bypass    Endo/Other  diabetes, Type 2  Morbid obesity Ca 7.3   Renal/GU CRFRenal disease     Musculoskeletal negative musculoskeletal ROS (+)    Abdominal   Peds  Hematology  (+) Blood dyscrasia, anemia  CBC      Component                Value               Date/Time                 WBC                      27.8 (H)            06/18/2022 0750           HGB                      5.9 (LL)            06/18/2022 0750           HCT                      18.7 (L)            06/18/2022 0750           PLT                      353                 06/18/2022 0750              Anesthesia Other Findings Necrotizing fasciitis   Reproductive/Obstetrics                             Anesthesia Physical Anesthesia Plan  ASA: 3 and  emergent  Anesthesia Plan: General   Post-op Pain Management: Tylenol PO (pre-op)*   Induction: Intravenous  PONV Risk Score and Plan: 3 and Treatment may vary due to age or medical condition, Ondansetron, Dexamethasone and Midazolam  Airway Management Planned: Oral ETT  Additional Equipment: None  Intra-op Plan:   Post-operative Plan: Extubation in OR  Informed Consent: I have reviewed the patients History and Physical, chart, labs and discussed the procedure including the risks, benefits and alternatives for  the proposed anesthesia with the patient or authorized representative who has indicated his/her understanding and acceptance.     Dental advisory given  Plan Discussed with: CRNA, Anesthesiologist and Surgeon  Anesthesia Plan Comments:        Anesthesia Quick Evaluation

## 2022-06-18 NOTE — Consult Note (Signed)
Orthopaedic Consult  Date/Time: 06/18/22 12:27 PM  Patient Name: Jacqueline [name]  Attending Physician: Davonna Belling, MD    ASSESSMENT & PLAN  Orthopaedic Assessment: 41 y.o. female with right psoas abscess and pelvic necrotizing fasciitis with extension into right thigh.  Plan: Patient discussed extensively with general surgery team.  Given concern for evolving necrotizing soft tissue infection extending out of the pelvis into the thigh, plan is to proceed to OR on emergent basis for debridement in order to decrease the infection burden.  The nature of this infection was thoroughly discussed with the patient, including the possibility of need for subsequent surgeries which may involve amputation, possibly hip disarticulation.  I explained that this would be very major and extensive surgery, and would likely require transfer to another facility given the subsequent need for extensive soft tissue coverage after such procedure.  I also explained that necrotizing infections may rapidly progressed, and in some cases may result in death.  Plan to proceed to OR for debridement today as soon as possible.  Risks of the proposed surgical treatment were discussed with the patient, including bleeding, wound healing complications, infection, damage to surrounding structures, persistent pain, stiffness, lack of improvement, potential for subsequent arthritis or worsening of pre-existing arthritis, and need for further surgery, as well as complications related to anesthesia, cardiovascular complications, and death.  In particular, I explained that the infection does appear to be in very close proximity to or even involve the major neurovascular structures anteromedially in the thigh, and these structures are therefore particularly at risk both from the infection and from a surgical standpoint.  All questions were answered to the patient's satisfaction.  She understands all of this and wishes to proceed with surgery.   Continue broad-spectrum antibiotics.  Remainder of care per primary team.   Georgeanna Harrison M.D. Orthopaedic Surgery [facility]   Medical Decision Making  Amount/complexity of data: Is there a current pathologic fracture (e.g. neoplastic, osteoporotic insufficiency fracture)? No Independent interpretation of radiographic studies: Yes Review of radiology results (e.g. reports): Yes Tests ordered (e.g. additional radiographic studies, labs): No Lab results reviewed: Yes Reviewed old records: Yes History from another source (independent historian, e.g. family/friend/etc.): No Discussion of imaging, clinical data, and or management with independent medical provider: Yes Risk: Patient receiving IV controlled substances for pain: Yes Fracture requiring manipulation: No Urgent or emergent (non-elective) surgery likely this admission: Yes Presence of medical comorbidities and/or surgical risk factors (e.g. current smoker, CAD, diabetes, COPD, CKD, etc.): Yes Closed fracture management WITHOUT manipulation: No Urgent minor procedure (e.g. joint aspiration, compartment pressure measurement, etc.): No Will likely need surgery as an outpatient: No     HPI Jacqueline [name] is a 41 y.o. female. Orthopaedic consultation has specifically been requested to address this patient's current musculoskeletal presentation.  She reports several days of pelvic and right groin pain associated with progressive loss of sensation in the right lower extremity as well as weakness.  This been associated with fevers.  This gotten progressively worse to the point that she could no longer ambulate.   PMH Past Medical History:  Diagnosis Date   Anemia    Anxiety    Chronic bronchitis (HCC)    Hypertension    Increased frequency of headaches    Morbid obesity ([place])    Sleep apnea    Type II diabetes mellitus ([place])      [place] Past Surgical History:  Procedure Laterality Date   BARIATRIC  SURGERY  BREAST REDUCTION SURGERY  03/21/2017   CARDIAC CATHETERIZATION  01/06/2016   CARDIAC CATHETERIZATION N/A 01/06/2016   Procedure: Left Heart Cath and Coronary Angiography;  Surgeon: Wellington Hampshire, MD;  Location: [facility];  Service: Cardiovascular;  Laterality: N/A;   CESAREAN SECTION  08/2014   REDUCTION MAMMAPLASTY Bilateral 03/21/2017   Home Medications Prior to Admission medications   Medication Sig Start Date End Date Taking? Authorizing Provider  amLODipine (NORVASC) 10 MG tablet Take 1 tablet (10 mg total) by mouth daily. 07/08/19   Nita Sells, MD  aspirin 81 MG EC tablet aspirin 81 mg tablet,delayed release    [provider]  bumetanide (BUMEX) 1 MG tablet Take 1 mg by mouth every morning. 01/12/21   [provider]  calcitRIOL (ROCALTROL) 0.25 MCG capsule calcitriol 0.25 mcg capsule  TAKE 1 CAPSULE BY MOUTH EVERY DAY    [provider]  carvedilol (COREG) 25 MG tablet Take 1 tablet (25 mg total) by mouth 2 (two) times daily with a meal. 05/06/18   Lady Deutscher, MD  cyanocobalamin 1000 MCG tablet cyanocobalamin (vit B-12) 1,000 mcg tablet  Take 1 tablet every day by oral route.    [provider]  diclofenac Sodium (VOLTAREN) 1 % GEL Apply 2 g topically 4 (four) times daily as needed (pain).     [provider]  folic acid (FOLVITE) 1 MG tablet folic acid 1 mg tablet    [provider]  furosemide (LASIX) 40 MG tablet Take 40 mg by mouth daily as needed for fluid or edema. 06/23/19   [provider]  gabapentin (NEURONTIN) 100 MG capsule Take 100 mg by mouth in the morning.    [provider]  gentamicin cream (GARAMYCIN) 0.1 % Apply 1 Application topically 2 (two) times daily. 03/22/22   Edrick Kins, DPM  hydrALAZINE (APRESOLINE) 10 MG tablet Take by mouth.    [provider]  hydrALAZINE (APRESOLINE) 50 MG tablet Take 1 tablet (50 mg total) by mouth 3 (three) times  daily. 07/07/19   Nita Sells, MD  methocarbamol (ROBAXIN) 500 MG tablet methocarbamol 500 mg tablet    [provider]  Misc Natural Products (AIRBORNE ELDERBERRY) CHEW Chew 2 tablets by mouth daily.    [provider]  mupirocin ointment (BACTROBAN) 2 % mupirocin 2 % topical ointment  APPLY TO AFFECTED AREA 3 TIMES A DAY    [provider]  sertraline (ZOLOFT) 100 MG tablet Take 100 mg by mouth at bedtime. 04/30/19   [provider]  Vitamin D, Ergocalciferol, (DRISDOL) 1.25 MG (50000 UNIT) CAPS capsule Take 50,000 Units by mouth every Thursday. 12/23/19   [provider]     Allergies No Known Allergies   Family History Family History  Problem Relation Age of Onset   Diabetes Mother    Hypertension Mother    Thyroid disease Mother    Kidney disease Maternal Grandmother    Diabetes Maternal Grandmother    Heart attack Other     Social History Social History   Socioeconomic History   Marital status: Married    Spouse name: Not on file   Number of children: Not on file   Years of education: Not on file   Highest education level: Not on file  Occupational History   Not on file  Tobacco Use   Smoking status: Never   Smokeless tobacco: Never  Vaping Use   Vaping Use: Never used  Substance and Sexual Activity  Alcohol use: No   Drug use: No   Sexual activity: Yes    Birth control/protection: None  Other Topics Concern   Not on file  Social History Narrative   Not on file   Social Determinants of Health   Financial Resource Strain: Not on file  Food Insecurity: No Food Insecurity (02/11/2022)   Hunger Vital Sign    Worried About Running Out of Food in the [name] Year: Never true    Ran Out of Food in the [name] Year: Never true  Transportation Needs: No Transportation Needs (02/11/2022)   PRAPARE - Hydrologist (Medical): No    Lack of Transportation (Non-Medical): No  Physical Activity: Not  on file  Stress: Not on file  Social Connections: Not on file  Intimate Partner Violence: Not on file     Review of Systems MSK: As noted per HPI above GI: No current Nausea/vomiting ENT: Denies sore throat, epistaxis CV: Denies chest pain  Resp: No current shortness of breath  Other than mentioned above, there are no Constitutional, Neurological, Psychiatric, ENT, Ophthalmological, Cardiovascular, Respiratory, GI, GU, Musculoskeletal, Integumentary, Lymphatic, Endocrine or Allergic issues.     Imaging  Independent interpretation of orthopaedic-relevant films: CTs of the abdomen and pelvis and right lower extremity were independently reviewed.  These studies reveal a pelvic abscess involving the psoas musculature, with soft tissue gas tracking down along the psoas musculature down into the anterior and abductor compartment of the thigh, involving the neurovascular structures, and extending along the abductor musculature to close to the level of the knee.  Radiographic results: CT ABDOMEN PELVIS WO CONTRAST  Result Date: 06/18/2022 CLINICAL DATA:  41 year old female with history of right lower quadrant abdominal pain. Low hemoglobin. EXAM: CT ABDOMEN AND PELVIS WITHOUT CONTRAST TECHNIQUE: Multidetector CT imaging of the abdomen and pelvis was performed following the standard protocol without IV contrast. RADIATION DOSE REDUCTION: This exam was performed according to the departmental dose-optimization program which includes automated exposure control, adjustment of the mA and/or kV according to patient size and/or use of iterative reconstruction technique. COMPARISON:  CT the abdomen and pelvis 08/27/2021. FINDINGS: Lower chest: Small amount of pericardial fluid and/or thickening, unlikely to be of hemodynamic significance at this time. Hepatobiliary: No suspicious cystic or solid hepatic lesions are confidently identified on today's noncontrast CT examination. Diffuse low attenuation throughout  the hepatic parenchyma, indicative of a background of hepatic steatosis. Unenhanced appearance of the gallbladder is unremarkable. Pancreas: No definite pancreatic mass or peripancreatic fluid collections or inflammatory changes are noted on today's noncontrast CT examination. Spleen: Unremarkable. Adrenals/Urinary Tract: Mild right hydroureteronephrosis. Unenhanced appearance of the kidneys is otherwise unremarkable bilaterally. Right adrenal gland is normal in appearance. 2.8 x 2.1 cm low-attenuation (-7 HU) left adrenal nodule, compatible with a benign adenoma. No left hydroureteronephrosis. Urinary bladder is unremarkable in appearance. 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 No pathologic dilatation of small bowel or colon. Vascular/Lymphatic: No atherosclerotic calcifications are noted in the abdominal aorta. Mild atherosclerotic calcifications are noted in the pelvic vasculature. No definite lymphadenopathy confidently identified in the abdomen or pelvis. Reproductive: Uterus and ovaries are unremarkable in appearance. Other: Mild diffuse body wall edema. Presacral edema. Trace volume of ascites. No pneumoperitoneum. Musculoskeletal: Along the right pelvic sidewall (axial image 70 of series 3 and coronal image 72 of series 4) there is a mass-like area measuring 6.8 x 6.0 x 7.8 cm which is internally low-attenuation, apparently centered in the right psoas  musculature, with extension into the adjacent soft tissues, exerting substantial mass effect, particularly on the distal third of the right ureter. Gas is present in the deep soft tissues of the anterior compartment of the right thigh, tracking cephalad along the right iliacus musculature. There are no aggressive appearing lytic or blastic lesions noted in the visualized portions of the skeleton. IMPRESSION: 1. Findings, as above, highly concerning for necrotizing fasciitis in the right thigh. Further evaluation with  noncontrast CT of the right lower extremity should be considered to better evaluate the full extent of these findings. 2. Low-attenuation mass-like lesion along the right pelvic sidewall centered in the inferior aspect of the right psoas muscle, likely a large abscess. This exerts mass effect upon adjacent structures, most notably the distal third of the right ureter, associated with mild proximal right hydroureteronephrosis. 3. No definite spontaneous intraperitoneal or retroperitoneal hemorrhage confidently identified on today's examination to account for the patient's low hematocrit level. 4. Trace volume of ascites. 5. Diffuse body wall edema. 6. Small amount of pericardial fluid and/or thickening, unlikely to be of hemodynamic significance at this time. 7. Hepatic steatosis. 8. Left adrenal adenoma. 9. Additional incidental findings, as above. Critical Value/emergent results were called by telephone at the time of interpretation on 06/18/2022 at 9:58 am to provider [facility] , who verbally acknowledged these results. Electronically Signed   By: Vinnie Langton M.D.   On: 06/18/2022 10:02   DG Foot Complete Right  Result Date: 06/18/2022 CLINICAL DATA:  Foot swelling EXAM: RIGHT FOOT COMPLETE - 3 VIEW COMPARISON:  03/13/2022. FINDINGS: Disordered appearance of midfoot intertarsal configuration and bony fragmentation suggest Charcot joint changes. No definite acute traumatic abnormality identified. No osteolytic or osteoblastic changes. There is soft tissue swelling of the mid to distal foot. Plantar calcaneal spur noted. A 9 mm radiopaque needle shaped structure identified in the plantar soft tissues which could be a foreign body. IMPRESSION: Midfoot Charcot joint changes. Persistent foreign body in the plantar soft tissues. Electronically Signed   By: Sammie Bench M.D.   On: 06/18/2022 09:29   DG Chest 2 View  Result Date: 06/18/2022 CLINICAL DATA:  fever EXAM: CHEST - 2 VIEW COMPARISON:   03/05/2022 FINDINGS: Cardiac silhouette enlarged. No evidence of pneumothorax or pleural effusion. No evidence of pulmonary edema. There are thoracic degenerative changes. IMPRESSION: Enlarged cardiac silhouette.  Lungs are clear. Electronically Signed   By: Sammie Bench M.D.   On: 06/18/2022 09:19   Labs  Recent Labs    06/18/22 0750  WBC 27.8*  HGB 5.9*  HCT 18.7*  PLT 353    Recent Labs    06/18/22 0750  NA 138  K 4.1  CL 111  CO2 16*  BUN 61*  CREATININE 5.57*  GLUCOSE 171*  CALCIUM 7.3*    Lab Results  Component Value Date   INR 1.0 03/13/2022   INR 1.1 08/17/2020   INR 1.0 02/03/2020        Physical Examination  Patient is a 41 y.o. year old female who is in mild to moderate distress, ill-appearing, and crying, mood is alert.  Orientation: oriented to person, place, time, and general circumstances  Vital Signs: BP (!) 142/87   Pulse (!) 109   Temp 99.2 F (37.3 C) (Oral)   Resp (!) 23   SpO2 100%    Gait: Unable to ambulate.  Supine on stretcher.  Heart: Normal rate Lungs: Non-labored breathing Abdomen: Soft, Non-tender   Right Upper Extremity: Inspection: Atraumatic  Palpation: Nontender ROM: Full, painless Strength: Normal Sensation: Intact to light touch distally Skin: Intact Peripheral Vascular: Well perfused Joint Stability: No instability Reflexes: No pathologic Lymph Nodes: None Palpable Coordination: Intact, normal   Left Upper Extremity: Inspection: Atraumatic Palpation: Nontender ROM: Full, painless Strength: Normal Sensation: Intact to light touch distally Skin: Intact Peripheral Vascular: Well perfused Joint Stability: No instability Reflexes: No pathologic Lymph Nodes: None Palpable Coordination: Intact, normal    Right Lower Extremity: Inspection: Atraumatic appearance, no skin discoloration Palpation: Tender to deep palpation proximally near the groin, but otherwise no pain to palpation due to lack of  sensation ROM: Discomfort with hip range of motion; unable to actively range any aspects of right lower extremity Strength: Unable to maintain straight leg raise, flex the hip, or demonstrate DF/PF/EHL Sensation: Globally diminished to light touch throughout right lower extremity Skin: Intact without blistering or discoloration Peripheral Vascular: Normal DP pulse, warm and well-perfused distally Joint Stability: Knee stable to varus and valgus stress Reflexes: No pathologic Lymph Nodes: None Palpable Coordination: Limited by pain and injury   Left Lower Extremity: Inspection: Atraumatic Palpation: Nontender ROM: Full, painless Strength: Normal Sensation: Intact to light touch distally Skin: Intact Peripheral Vascular: Well perfused Joint Stability: No instability Reflexes: No pathologic Lymph Nodes: None Palpable Coordination: Intact, normal    Pelvis: Skin: Intact Palpation: Tender to deep palpation on the right side Stability: No instability      The review of the patient's medications does not in any way constitute an endorsement, by this clinician,  of their use, dosage, indications, route, efficacy, interactions, or other clinical parameters.  This note was generated within the [employer] EMR using Dragon medical speech recognition software and may contain inherent errors or omissions not intended by the user. Grammatical and punctuation errors, random word insertions, deletions, pronoun errors and incomplete sentences are occasional consequences of this technology due to software limitations. Not all errors are caught or corrected.  Although every attempt is made to root out erroneus and incomplete transcription, the note may still not fully represent the intent or opinion of the author. If there are questions or concerns about the content of this note or information contained within the body of this dictation they should be addressed directly with the author for clarification.

## 2022-06-18 NOTE — ED Triage Notes (Addendum)
Pt presents with EMS from home. Flu-like symptoms, aches, congestion, fever at home for 4 days. Pt also has an ongoing problem with her right foot she has been evaluated for and possible foreign body removal.  Has had increased swelling in her right foot x 2 days.  250 NS given en route.  20G LAC

## 2022-06-18 NOTE — Consult Note (Addendum)
NAMEMariyanna [name], MRN:  [id number], DOB:  1980-12-08, LOS: 0 ADMISSION DATE:  06/18/2022, CONSULTATION DATE: 06/18/2022 REFERRING MD: Dr. Kieth [name], CHIEF COMPLAINT: Sepsis  History of Present Illness:  40 year old woman with a history of diabetes, hypertension, obesity with OSA, CKD.  She has a history of a gastric bypass in 2020.  She has experienced 2 days of worsening right groin and right leg pain, ultimately to the point where she could not ambulate and was having difficulty moving her leg.  She was evaluated in the emergency department and by [facility].  A CT abdomen and pelvis showed evidence for necrotizing fasciitis in the right thigh with a masslike opacity in the right pelvic sidewall in the psoas musculature. CT of the right lower extremity confirmed gas in the anterior compartment of the right thigh, diffuse cellulitis, chronic appearing bony changes of the midfoot.  She was started on Jacqueline [name] and went urgently to the OR for surgical debridement by CCS and orthopedics.  She underwent debridement of skin, muscle, fascia and abdominal wall and retroperitoneum.  She was able to be extubated and taken to the PACU. In the PACU she is still sleepy but neurologically intact, hemodynamically stable.  Adequate saturations.  She will be admitted to the ICU for further care.  Pertinent  Medical History   Past Medical History:  Diagnosis Date   Anemia    Anxiety    Chronic bronchitis (HCC)    Hypertension    Increased frequency of headaches    Morbid obesity ([place])    Sleep apnea    Type II diabetes mellitus ([place])     Significant Hospital Events: Including procedures, antibiotic start and stop dates in addition to other pertinent events   12/2 surgical debridement right lower extremity, pelvis, retroperitoneal necrotizing fasciitis  Interim History / Subjective:  Patient is waking up, stable in the PACU Complains of right leg pain  Objective   Blood pressure 135/79, pulse 92, temperature  97.7 F (36.5 C), resp. rate (!) 22, height 5\' 5"  (1.651 m), weight 82.6 kg, SpO2 99 %.        Intake/Output Summary (Last 24 hours) at 06/18/2022 1652 Last data filed at 06/18/2022 1544 Gross per 24 hour  Intake 1540 ml  Output 150 ml  Net 1390 ml   Filed Weights   06/18/22 1343  Weight: 82.6 kg    Examination: General: Obese ill-appearing woman laying in bed in no distress HENT: Oropharynx clear, pupils equal, no stridor or secretions Lungs: Decreased to both bases, clear anteriorly Cardiovascular: Regular, distant, no murmur Abdomen: Nondistended, positive bowel sounds Extremities: Dressing on right thigh extending to the hip and right flank Neuro: Awake, alert, interacting appropriately, follows commands  Resolved Hospital Problem list     Assessment & Plan:   Necrotizing fasciitis right lower extremity, debridement 12/2 Sepsis -Antibiotics Jacqueline [name], Jacqueline [name] ordered -Gentle IV fluid resuscitation -Blood cultures and wound cultures ordered and pending -Check spot lactic acid (was 1.9 on 12/2 AM) -pain control as per CCS plans  Acute on chronic renal failure, due to sepsis, ATN Metabolic acidosis -IV fluid resuscitation -Recheck BMP now and follow, follow urine output -Hold Lasix, Bumex -Insure adequate renal perfusion -Renal dose medications -Consider nephrology consultation depending on trend  DM Type II w hyperglycemia -start ICU hyperglycemia protocol, SSI  Hx Hypertension -Hold patient's antihypertensive regimen, diuretics until stabilized from acute events  Depression -Restart sertraline when patient able to tolerate p.o. meds   Best Practice (right click and "Reselect  all SmartList Selections" daily)   Diet/type: NPO DVT prophylaxis: prophylactic heparin  GI prophylaxis: PPI Lines: Central line Foley:  Yes, and it is still needed Code Status:  full code Last date of multidisciplinary goals of care discussion [pending]  Labs   CBC: Recent  Labs  Lab 06/18/22 0750  WBC 27.8*  NEUTROABS 26.7*  HGB 5.9*  HCT 18.7*  MCV 91.7  PLT 144    Basic Metabolic Panel: Recent Labs  Lab 06/18/22 0750  NA 138  K 4.1  CL 111  CO2 16*  GLUCOSE 171*  BUN 61*  CREATININE 5.57*  CALCIUM 7.3*   GFR: Estimated Creatinine Clearance: 14 mL/min (A) (by C-G formula based on SCr of 5.57 mg/dL (H)). Recent Labs  Lab 06/18/22 0750 06/18/22 0754  WBC 27.8*  --   LATICACIDVEN  --  1.9    Liver Function Tests: Recent Labs  Lab 06/18/22 0750  AST 23  ALT 21  ALKPHOS 71  BILITOT 0.7  PROT 6.0*  ALBUMIN 1.7*   No results for input(s): "LIPASE", "AMYLASE" in the last 168 hours. No results for input(s): "AMMONIA" in the last 168 hours.  ABG    Component Value Date/Time   HCO3 21.6 01/31/2017 1620   TCO2 23 01/31/2017 1620   ACIDBASEDEF 2.0 01/31/2017 1620   O2SAT 69.0 01/31/2017 1620     Coagulation Profile: No results for input(s): "INR", "PROTIME" in the last 168 hours.  Cardiac Enzymes: No results for input(s): "CKTOTAL", "CKMB", "CKMBINDEX", "TROPONINI" in the last 168 hours.  HbA1C: Hgb A1c MFr Bld  Date/Time Value Ref Range Status  12/02/2019 08:38 AM 6.5 (H) 4.8 - 5.6 % Final    Comment:    (NOTE)         Prediabetes: 5.7 - 6.4         Diabetes: >6.4         Glycemic control for adults with diabetes: <7.0   05/04/2018 02:17 AM 10.2 (H) 4.8 - 5.6 % Final    Comment:    (NOTE) Pre diabetes:          5.7%-6.4% Diabetes:              >6.4% Glycemic control for   <7.0% adults with diabetes     CBG: No results for input(s): "GLUCAP" in the last 168 hours.  Review of Systems:   As per HPI - c/o some RLE pain post-op  Past Medical History:  She,  has a past medical history of Anemia, Anxiety, Chronic bronchitis ([place]), Hypertension, Increased frequency of headaches, Morbid obesity ([place]), Sleep apnea, and Type II diabetes mellitus ([place]).   Surgical History:   Past Surgical History:  Procedure  Laterality Date   BARIATRIC SURGERY     BREAST REDUCTION SURGERY  03/21/2017   CARDIAC CATHETERIZATION  01/06/2016   CARDIAC CATHETERIZATION N/A 01/06/2016   Procedure: Left Heart Cath and Coronary Angiography;  Surgeon: Wellington Hampshire, MD;  Location: [facility];  Service: Cardiovascular;  Laterality: N/A;   CESAREAN SECTION  08/2014   REDUCTION MAMMAPLASTY Bilateral 03/21/2017     Social History:   reports that she has never smoked. She has never used smokeless tobacco. She reports that she does not drink alcohol and does not use drugs.   Family History:  Her family history includes Diabetes in her maternal grandmother and mother; Heart attack in an other family member; Hypertension in her mother; Kidney disease in her maternal grandmother; Thyroid disease in her  mother.   Allergies No Known Allergies   Home Medications  Prior to Admission medications   Medication Sig Start Date End Date Taking? Authorizing Provider  amLODipine (NORVASC) 10 MG tablet Take 1 tablet (10 mg total) by mouth daily. 07/08/19  Yes Nita Sells, MD  bumetanide (BUMEX) 1 MG tablet Take 1 mg by mouth every morning. 01/12/21  Yes [provider]  calcitRIOL (ROCALTROL) 0.25 MCG capsule Take 0.25 mcg by mouth daily.   Yes [provider]  carvedilol (COREG) 25 MG tablet Take 1 tablet (25 mg total) by mouth 2 (two) times daily with a meal. 05/06/18  Yes Lady Deutscher, MD  diclofenac Sodium (VOLTAREN) 1 % GEL Apply 2 g topically 4 (four) times daily as needed (pain).    Yes [provider]  folic acid (FOLVITE) 1 MG tablet Take 1 mg by mouth daily.   Yes [provider]  furosemide (LASIX) 40 MG tablet Take 40 mg by mouth daily as needed for fluid or edema. 06/23/19  Yes [provider]  hydrALAZINE (APRESOLINE) 10 MG tablet Take 10 mg by mouth daily.   Yes [provider]  methocarbamol (ROBAXIN) 500 MG tablet Take 500 mg by mouth every 6  (six) hours as needed for muscle spasms.   Yes [provider]  sertraline (ZOLOFT) 100 MG tablet Take 100 mg by mouth at bedtime. 04/30/19  Yes [provider]  Vitamin D, Ergocalciferol, (DRISDOL) 1.25 MG (50000 UNIT) CAPS capsule Take 50,000 Units by mouth every Thursday. 12/23/19  Yes [provider]  gentamicin cream (GARAMYCIN) 0.1 % Apply 1 Application topically 2 (two) times daily. Patient not taking: Reported on 06/18/2022 03/22/22   Edrick Kins, DPM  hydrALAZINE (APRESOLINE) 50 MG tablet Take 1 tablet (50 mg total) by mouth 3 (three) times daily. Patient not taking: Reported on 06/18/2022 07/07/19   Nita Sells, MD     Critical care time: 67 min     Baltazar Apo, MD, PhD 06/18/2022, 5:16 PM [facility] [phone] or if no answer before 7:00PM call [phone] For any issues after 7:00PM please call eLink [phone]

## 2022-06-18 NOTE — Op Note (Signed)
Preoperative diagnosis: necrotizing fasciitis  Postoperative diagnosis: same   Procedure: debridement of skin, muscle (iliacus, psoas, transversus abdominis), fascia of pelvis, abdominal wall and retroperitoneum dissection of 20 x 20 cm along iliac ala  Surgeon: Gurney Maxin, M.D.  CO-Surgeon:   Anesthesia: GETA  Indications for procedure: Jacqueline [name] is a 41 y.o. year old female with symptoms of right leg swelling and pain and numbness. Work up showed gas along the medial and anterior thigh tracking up along the iliacus muscle and psoas.  Description of procedure: The patient was brought into the operative suite. Anesthesia was administered with General endotracheal anesthesia. WHO checklist was applied. The patient was then placed in supine position. The area was prepped and draped in the usual sterile fashion.  Dr. Mable Fill began with a thigh dissection please see his dictation for more details.   After the thigh infection was delineated, A oblique incision was made in the right groin. Cautery was used to dissect through the subcutaneous tissue and the fascia was divided laterally. This allowed visualization of a necrotic iliacus muscle. The necrotic portion of the muscle was removed with cautery. The femoral nerve was identified and preserved. The iliac vessels were identified anterior and medial to the necrotic infection tract. This led me to think that the ureter was well medial of the dissection area. Blunt dissection was performed to develop the plane to the top of the iliac crest and medial near the spine. A large amount of thin gray liquid was drained from this area. The wound was irrigated with warm saline. The wound was inspected for hemostasis, additional cautery was used for hemostasis. Salinated Kerlex was placed into the wound. The patient awoke from anesthesia and brought to PACU extubated but in critical condition.  Excisional debridement:  1.  Progress note or procedure note  with a detailed description of the procedure.  2.  Tool used for debridement (curette, scapel, etc.)  scapel, cautery  3.  Frequency of surgical debridement.   First time  4.  Measurement of total devitalized tissue (wound surface) before and after surgical debridement.   None before, after pelvis was 20 x 20 cm  5.  Area and depth of devitalized tissue removed from wound.  Right pelvis and groin into the retroperiteum up to the iliac crest  6.  Blood loss and description of tissue removed.  150 ml, gray devitalized fascia and muscle, thin gray pus  7.  Evidence of the progress of the wound's response to treatment.  A.  Current wound volume (current dimensions and depth).  10 cm x 20 cm x 20 cm  B.  Presence (and extent of) of infection.  Extensive necrotizing soft tissue infection  C.  Presence (and extent of) of non viable tissue.  Yes, along iliacus and psoas muscle posterior to the iliac vessels, also tracks into the thigh  D.  Other material in the wound that is expected to inhibit healing.  none  8.  Was there any viable tissue removed (measurements): none   Plan: admit to ICU, plan to return to OR for wound examination and possible further debridement, broad spectrum antibiotics  Findings: necrotizing soft tissue infection of the right retroperitoneum, iliacus muscle, and psoas muscle  Specimen: thigh necrotizing infection culture  Implant: 3 Kerlex rolls into the pelvis and thigh   Blood loss: 150 ml  Local anesthesia: none  Complications: none  Gurney Maxin, M.D. General, Bariatric, & Minimally Invasive Surgery [facility]

## 2022-06-19 ENCOUNTER — Encounter (HOSPITAL_COMMUNITY): Payer: Self-pay | Admitting: General Surgery

## 2022-06-19 DIAGNOSIS — M729 Fibroblastic disorder, unspecified: Secondary | ICD-10-CM | POA: Diagnosis not present

## 2022-06-19 DIAGNOSIS — N170 Acute kidney failure with tubular necrosis: Secondary | ICD-10-CM | POA: Diagnosis not present

## 2022-06-19 LAB — COMPREHENSIVE METABOLIC PANEL
ALT: 24 U/L (ref 0–44)
AST: 39 U/L (ref 15–41)
Albumin: 1.9 g/dL — ABNORMAL LOW (ref 3.5–5.0)
Alkaline Phosphatase: 65 U/L (ref 38–126)
Anion gap: 13 (ref 5–15)
BUN: 64 mg/dL — ABNORMAL HIGH (ref 6–20)
CO2: 14 mmol/L — ABNORMAL LOW (ref 22–32)
Calcium: 7.2 mg/dL — ABNORMAL LOW (ref 8.9–10.3)
Chloride: 112 mmol/L — ABNORMAL HIGH (ref 98–111)
Creatinine, Ser: 5.33 mg/dL — ABNORMAL HIGH (ref 0.44–1.00)
GFR, Estimated: 10 mL/min — ABNORMAL LOW (ref >60)
Glucose, Bld: 125 mg/dL — ABNORMAL HIGH (ref 70–99)
Potassium: 4.5 mmol/L (ref 3.5–5.1)
Sodium: 139 mmol/L (ref 135–145)
Total Bilirubin: 1.6 mg/dL — ABNORMAL HIGH (ref 0.3–1.2)
Total Protein: 5.7 g/dL — ABNORMAL LOW (ref 6.5–8.1)

## 2022-06-19 LAB — CBC
HCT: 30 % — ABNORMAL LOW (ref 36.0–46.0)
Hemoglobin: 10.4 g/dL — ABNORMAL LOW (ref 12.0–15.0)
MCH: 30.6 pg (ref 26.0–34.0)
MCHC: 34.7 g/dL (ref 30.0–36.0)
MCV: 88.2 fL (ref 80.0–100.0)
Platelets: 255 10*3/uL (ref 150–400)
RBC: 3.4 MIL/uL — ABNORMAL LOW (ref 3.87–5.11)
RDW: 14.9 % (ref 11.5–15.5)
WBC: 24 10*3/uL — ABNORMAL HIGH (ref 4.0–10.5)
nRBC: 0.1 % (ref 0.0–0.2)

## 2022-06-19 LAB — BASIC METABOLIC PANEL
Anion gap: 12 (ref 5–15)
Anion gap: 16 — ABNORMAL HIGH (ref 5–15)
BUN: 65 mg/dL — ABNORMAL HIGH (ref 6–20)
BUN: 68 mg/dL — ABNORMAL HIGH (ref 6–20)
CO2: 13 mmol/L — ABNORMAL LOW (ref 22–32)
CO2: 14 mmol/L — ABNORMAL LOW (ref 22–32)
Calcium: 6.9 mg/dL — ABNORMAL LOW (ref 8.9–10.3)
Calcium: 7.1 mg/dL — ABNORMAL LOW (ref 8.9–10.3)
Chloride: 110 mmol/L (ref 98–111)
Chloride: 108 mmol/L (ref 98–111)
Creatinine, Ser: 5.2 mg/dL — ABNORMAL HIGH (ref 0.44–1.00)
Creatinine, Ser: 5.53 mg/dL — ABNORMAL HIGH (ref 0.44–1.00)
GFR, Estimated: 10 mL/min — ABNORMAL LOW (ref >60)
GFR, Estimated: 9 mL/min — ABNORMAL LOW (ref >60)
Glucose, Bld: 126 mg/dL — ABNORMAL HIGH (ref 70–99)
Glucose, Bld: 109 mg/dL — ABNORMAL HIGH (ref 70–99)
Potassium: 5.8 mmol/L — ABNORMAL HIGH (ref 3.5–5.1)
Potassium: 4.4 mmol/L (ref 3.5–5.1)
Sodium: 135 mmol/L (ref 135–145)
Sodium: 138 mmol/L (ref 135–145)

## 2022-06-19 LAB — GLUCOSE, CAPILLARY
Glucose-Capillary: 110 mg/dL — ABNORMAL HIGH (ref 70–99)
Glucose-Capillary: 118 mg/dL — ABNORMAL HIGH (ref 70–99)
Glucose-Capillary: 132 mg/dL — ABNORMAL HIGH (ref 70–99)
Glucose-Capillary: 132 mg/dL — ABNORMAL HIGH (ref 70–99)
Glucose-Capillary: 154 mg/dL — ABNORMAL HIGH (ref 70–99)
Glucose-Capillary: 215 mg/dL — ABNORMAL HIGH (ref 70–99)
Glucose-Capillary: 69 mg/dL — ABNORMAL LOW (ref 70–99)
Glucose-Capillary: 75 mg/dL (ref 70–99)

## 2022-06-19 LAB — PHOSPHORUS: Phosphorus: 6.4 mg/dL — ABNORMAL HIGH (ref 2.5–4.6)

## 2022-06-19 LAB — MAGNESIUM: Magnesium: 1.8 mg/dL (ref 1.7–2.4)

## 2022-06-19 MED ORDER — SODIUM ZIRCONIUM CYCLOSILICATE 10 G PO PACK
10.0000 g | PACK | Freq: Once | ORAL | Status: AC
  Administered 2022-06-19: 10 g via ORAL
  Filled 2022-06-19: qty 1

## 2022-06-19 MED ORDER — INSULIN ASPART 100 UNIT/ML IJ SOLN
0.0000 [IU] | Freq: Three times a day (TID) | INTRAMUSCULAR | Status: DC
  Administered 2022-06-19: 3 [IU] via SUBCUTANEOUS
  Administered 2022-06-19 – 2022-06-20 (×2): 2 [IU] via SUBCUTANEOUS
  Administered 2022-06-20 – 2022-06-21 (×3): 3 [IU] via SUBCUTANEOUS
  Administered 2022-06-21: 5 [IU] via SUBCUTANEOUS
  Administered 2022-06-22: 3 [IU] via SUBCUTANEOUS
  Administered 2022-06-22: 2 [IU] via SUBCUTANEOUS

## 2022-06-19 MED ORDER — INSULIN ASPART 100 UNIT/ML IJ SOLN
0.0000 [IU] | Freq: Every day | INTRAMUSCULAR | Status: DC
  Administered 2022-06-19: 2 [IU] via SUBCUTANEOUS

## 2022-06-19 MED ORDER — DEXTROSE 50 % IV SOLN
INTRAVENOUS | Status: AC
  Filled 2022-06-19: qty 50

## 2022-06-19 MED ORDER — ONDANSETRON HCL 4 MG/2ML IJ SOLN
INTRAMUSCULAR | Status: AC
  Filled 2022-06-19: qty 2

## 2022-06-19 MED ORDER — DEXTROSE 50 % IV SOLN
12.5000 g | INTRAVENOUS | Status: AC
  Administered 2022-06-19: 12.5 g via INTRAVENOUS

## 2022-06-19 MED ORDER — SERTRALINE HCL 100 MG PO TABS
100.0000 mg | ORAL_TABLET | Freq: Every day | ORAL | Status: DC
  Administered 2022-06-19 – 2022-06-28 (×10): 100 mg via ORAL
  Filled 2022-06-19 (×9): qty 1
  Filled 2022-06-19: qty 2

## 2022-06-19 NOTE — Progress Notes (Addendum)
1 Day Post-Op   Chief Complaint/Subjective: Pain controlled  Objective: Vital signs in last 24 hours: Temp:  [97.7 F (36.5 C)-98.8 F (37.1 C)] 98 F (36.7 C) (12/03 0600) Pulse Rate:  [79-113] 80 (12/03 0800) Resp:  [14-26] 14 (12/03 0800) BP: (96-149)/(53-87) 104/75 (12/03 0800) SpO2:  [95 %-100 %] 100 % (12/03 0800) Weight:  [82.6 kg-91.5 kg] 91.5 kg (12/02 1730) Last BM Date : 06/17/22 Intake/Output from previous day: 12/02 0701 - 12/03 0700 In: 2165.8 [I.V.:532.5; Blood:890; IV Piggyback:743.3] Out: 675 [Urine:525; Blood:150]  PE: Gen: NAD, conversant Resp: nonlabored Card: RRR Wounds: right thigh packed with serous drainage, right groin wound packed without surrounding erythema  Lab Results:  Recent Labs    06/18/22 0750 06/18/22 1755 06/19/22 0056  WBC 27.8*  --  24.0*  HGB 5.9* 10.9* 10.4*  HCT 18.7* 32.2* 30.0*  PLT 353  --  255   Recent Labs    06/18/22 1755 06/19/22 0056  NA 140 139  K 5.1 4.5  CL 113* 112*  CO2 12* 14*  GLUCOSE 110* 125*  BUN 63* 64*  CREATININE 5.15* 5.33*  CALCIUM 7.4* 7.2*   No results for input(s): "LABPROT", "INR" in the last 72 hours.    Component Value Date/Time   NA 139 06/19/2022 0056   K 4.5 06/19/2022 0056   CL 112 (H) 06/19/2022 0056   CO2 14 (L) 06/19/2022 0056   GLUCOSE 125 (H) 06/19/2022 0056   BUN 64 (H) 06/19/2022 0056   CREATININE 5.33 (H) 06/19/2022 0056   CALCIUM 7.2 (L) 06/19/2022 0056   PROT 5.7 (L) 06/19/2022 0056   ALBUMIN 1.9 (L) 06/19/2022 0056   AST 39 06/19/2022 0056   ALT 24 06/19/2022 0056   ALKPHOS 65 06/19/2022 0056   BILITOT 1.6 (H) 06/19/2022 0056   GFRNONAA 10 (L) 06/19/2022 0056   GFRAA 31 (L) 12/03/2019 0345    Anti-infectives: Anti-infectives (From admission, onward)    Start     Dose/Rate Route Frequency Ordered Stop   06/18/22 1900  linezolid (ZYVOX) IVPB 600 mg        600 mg 300 mL/hr over 60 Minutes Intravenous Every 12 hours 06/18/22 1702     06/18/22 1715   meropenem (MERREM) 1 g in sodium chloride 0.9 % 100 mL IVPB        1 g 200 mL/hr over 30 Minutes Intravenous Every 12 hours 06/18/22 1709     06/18/22 0745  piperacillin-tazobactam (ZOSYN) IVPB 3.375 g        3.375 g 100 mL/hr over 30 Minutes Intravenous  Once 06/18/22 0733 06/18/22 0835       Assessment/Plan  s/p Procedure(s): IRRIGATION AND DEBRIDEMENT RIGHT ABDOMEN AND THIGH 06/18/2022   -breathing well, pressor requirement resolved  Anemia present on admission - -hemoglobin much improved and stable from yesterday AKI - Cr still elevated above baseline, CCM with bicarb for lactic acidosis Hypoalbuminemia - significantly decreased from 1 month ago, likely related to septic shock and necrotizing soft tissue infection  FEN - advance diet, NPO after midnight VTE - heparin sq q8h ID - zyvox and meropenem 12/2 >> Disposition - OR tomorrow   LOS: 1 day   I reviewed last 24 h vitals and pain scores, last 48 h intake and output, last 24 h labs and trends, and last 24 h imaging results.  This care required high  level of medical decision making.   [facility] 06/19/2022, 8:44 AM Please  see Amion for pager number during day hours 7:00am-4:30pm or 7:00am -11:30am on weekends

## 2022-06-19 NOTE — Anesthesia Preprocedure Evaluation (Signed)
Anesthesia Evaluation  Patient identified by MRN, date of birth, ID band Patient awake    Reviewed: Allergy & Precautions, NPO status , Patient's Chart, lab work & pertinent test results, reviewed documented beta blocker date and time   Airway Mallampati: II  TM Distance: >3 FB Neck ROM: Full    Dental no notable dental hx. (+) Teeth Intact, Dental Advisory Given   Pulmonary sleep apnea    Pulmonary exam normal breath sounds clear to auscultation       Cardiovascular hypertension, Pt. on home beta blockers and Pt. on medications +CHF  Normal cardiovascular exam+ dysrhythmias Atrial Fibrillation  Rhythm:Regular Rate:Normal  TTE 2020  1. Left ventricular ejection fraction, by visual estimation, is 60 to  65%. The left ventricle has normal function. Left ventricular septal wall  thickness was moderately increased. Moderately increased left ventricular  posterior wall thickness. There is  severely increased left ventricular hypertrophy of the basal septum.   2. The left ventricle has no regional wall motion abnormalities.   3. Left ventricular diastolic parameters are consistent with Grade II  diastolic dysfunction (pseudonormalization).   4. Elevated left ventricular end-diastolic pressure.   5. Global right ventricle has normal systolic function.The right  ventricular size is normal. No increase in right ventricular wall  thickness.   6. Left atrial size was mildly dilated.   7. Right atrial size was normal.   8. The mitral valve is normal in structure. No evidence of mitral valve  regurgitation. No evidence of mitral stenosis.   9. The tricuspid valve is normal in structure. Tricuspid valve  regurgitation is not demonstrated.  10. The aortic valve is normal in structure. Aortic valve regurgitation is  not visualized. No evidence of aortic valve sclerosis or stenosis.  11. The pulmonic valve was normal in structure. Pulmonic  valve  regurgitation is not visualized.  12. The inferior vena cava is normal in size with greater than 50%  respiratory variability, suggesting right atrial pressure of 3 mmHg.   Cath 2017 1. Normal coronary arteries. 2. Left ventricular angiography was not performed. EF was normal by echocardiogram recently. 3. Mildly elevated left ventricular end-diastolic pressure with mild gradient across the LVOT.    Neuro/Psych  Headaches PSYCHIATRIC DISORDERS Anxiety Depression       GI/Hepatic negative GI ROS, Neg liver ROS,,,  Endo/Other  diabetes    Renal/GU CRFRenal diseaseLab Results      Component                Value               Date                      CREATININE               5.55 (H)            06/20/2022                BUN                      74 (H)              06/20/2022                NA                       134 (L)  06/20/2022                K                        4.3                 06/20/2022                CL                       107                 06/20/2022                CO2                      15 (L)              06/20/2022             negative genitourinary   Musculoskeletal negative musculoskeletal ROS (+)    Abdominal   Peds  Hematology  (+) Blood dyscrasia, anemia Lab Results      Component                Value               Date                      WBC                      24.8 (H)            06/20/2022                HGB                      9.2 (L)             06/20/2022                HCT                      27.6 (L)            06/20/2022                MCV                      89.3                06/20/2022                PLT                      219                 06/20/2022              Anesthesia Other Findings   Reproductive/Obstetrics                             Anesthesia Physical Anesthesia Plan  ASA: 3  Anesthesia Plan: General   Post-op Pain Management: Ofirmev IV (intra-op)*    Induction: Intravenous  PONV Risk Score and Plan: 3 and Midazolam, Dexamethasone and Ondansetron  Airway Management Planned: Oral ETT and  LMA  Additional Equipment:   Intra-op Plan:   Post-operative Plan: Extubation in OR  Informed Consent: I have reviewed the patients History and Physical, chart, labs and discussed the procedure including the risks, benefits and alternatives for the proposed anesthesia with the patient or authorized representative who has indicated his/her understanding and acceptance.     Dental advisory given  Plan Discussed with: CRNA  Anesthesia Plan Comments:        Anesthesia Quick Evaluation

## 2022-06-19 NOTE — Anesthesia Postprocedure Evaluation (Signed)
Anesthesia Post Note  Patient: Media planner  Procedure(s) Performed: IRRIGATION AND DEBRIDEMENT RIGHT ABDOMEN AND THIGH (Right: Abdomen)     Patient location during evaluation: PACU Anesthesia Type: General Level of consciousness: awake and alert Pain management: pain level controlled Vital Signs Assessment: post-procedure vital signs reviewed and stable Respiratory status: spontaneous breathing, nonlabored ventilation and respiratory function stable Cardiovascular status: stable and blood pressure returned to baseline Anesthetic complications: no   No notable events documented.  Last Vitals:  Vitals:   06/18/22 2300 06/19/22 0000  BP: 99/67 97/60  Pulse:  81  Resp:  15  Temp: 36.6 C   SpO2:  97%    Last Pain:  Vitals:   06/18/22 2300  TempSrc: Oral  PainSc:                  Audry Pili

## 2022-06-19 NOTE — Progress Notes (Incomplete)
General Surgery Follow Up Note  Subjective:    Overnight Issues:   Objective:  Vital signs for last 24 hours: Temp:  [97.7 F (36.5 C)-99.2 F (37.3 C)] 97.8 F (36.6 C) (12/02 2300) Pulse Rate:  [79-113] 80 (12/03 0200) Resp:  [15-26] 16 (12/03 0200) BP: (97-149)/(53-87) 113/73 (12/03 0200) SpO2:  [95 %-100 %] 98 % (12/03 0200) Weight:  [82.6 kg-91.5 kg] 91.5 kg (12/02 1730)  Hemodynamic parameters for last 24 hours:    Intake/Output from previous day: 12/02 0701 - 12/03 0700 In: 1865.8 [I.V.:332.5; Blood:890; IV Piggyback:643.3] Out: 275 [Urine:125; Blood:150]  Intake/Output this shift: Total I/O In: 325.8 [I.V.:182.5; IV Piggyback:143.3] Out: -   Vent settings for last 24 hours:    Physical Exam:  Gen: comfortable, no distress Neuro: non-focal exam HEENT: PERRL Neck: supple CV: RRR Pulm: unlabored breathing Abd: soft, NT GU: clear yellow urine Extr: wwp, no edema   Results for orders placed or performed during the hospital encounter of 06/18/22 (from the past 24 hour(s))  CBC with Differential     Status: Abnormal   Collection Time: 06/18/22  7:50 AM  Result Value Ref Range   WBC 27.8 (H) 4.0 - 10.5 K/uL   RBC 2.04 (L) 3.87 - 5.11 MIL/uL   Hemoglobin 5.9 (LL) 12.0 - 15.0 g/dL   HCT 18.7 (L) 36.0 - 46.0 %   MCV 91.7 80.0 - 100.0 fL   MCH 28.9 26.0 - 34.0 pg   MCHC 31.6 30.0 - 36.0 g/dL   RDW 14.0 11.5 - 15.5 %   Platelets 353 150 - 400 K/uL   nRBC 0.0 0.0 - 0.2 %   Neutrophils Relative % 96 %   Neutro Abs 26.7 (H) 1.7 - 7.7 K/uL   Lymphocytes Relative 2 %   Lymphs Abs 0.6 (L) 0.7 - 4.0 K/uL   Monocytes Relative 2 %   Monocytes Absolute 0.6 0.1 - 1.0 K/uL   Eosinophils Relative 0 %   Eosinophils Absolute 0.0 0.0 - 0.5 K/uL   Basophils Relative 0 %   Basophils Absolute 0.0 0.0 - 0.1 K/uL   WBC Morphology See Note    nRBC 0 0 /100 WBC   Abs Immature Granulocytes 0.00 0.00 - 0.07 K/uL  Comprehensive metabolic panel     Status: Abnormal    Collection Time: 06/18/22  7:50 AM  Result Value Ref Range   Sodium 138 135 - 145 mmol/L   Potassium 4.1 3.5 - 5.1 mmol/L   Chloride 111 98 - 111 mmol/L   CO2 16 (L) 22 - 32 mmol/L   Glucose, Bld 171 (H) 70 - 99 mg/dL   BUN 61 (H) 6 - 20 mg/dL   Creatinine, Ser 5.57 (H) 0.44 - 1.00 mg/dL   Calcium 7.3 (L) 8.9 - 10.3 mg/dL   Total Protein 6.0 (L) 6.5 - 8.1 g/dL   Albumin 1.7 (L) 3.5 - 5.0 g/dL   AST 23 15 - 41 U/L   ALT 21 0 - 44 U/L   Alkaline Phosphatase 71 38 - 126 U/L   Total Bilirubin 0.7 0.3 - 1.2 mg/dL   GFR, Estimated 9 (L) >60 mL/min   Anion gap 11 5 - 15  Lactic acid, plasma     Status: None   Collection Time: 06/18/22  7:54 AM  Result Value Ref Range   Lactic Acid, Venous 1.9 0.5 - 1.9 mmol/L  I-Stat beta hCG blood, ED     Status: Abnormal   Collection Time: 06/18/22  7:58 AM  Result Value Ref Range   I-stat hCG, quantitative 7.5 (H) <5 mIU/mL   Comment 3          Urinalysis, Routine w reflex microscopic Nasal Mucosa     Status: Abnormal   Collection Time: 06/18/22  8:20 AM  Result Value Ref Range   Color, Urine YELLOW YELLOW   APPearance HAZY (A) CLEAR   Specific Gravity, Urine 1.013 1.005 - 1.030   pH 7.0 5.0 - 8.0   Glucose, UA 50 (A) NEGATIVE mg/dL   Hgb urine dipstick NEGATIVE NEGATIVE   Bilirubin Urine NEGATIVE NEGATIVE   Ketones, ur NEGATIVE NEGATIVE mg/dL   Protein, ur >=300 (A) NEGATIVE mg/dL   Nitrite NEGATIVE NEGATIVE   Leukocytes,Ua TRACE (A) NEGATIVE   RBC / HPF 0-5 0 - 5 RBC/hpf   WBC, UA 11-20 0 - 5 WBC/hpf   Bacteria, UA RARE (A) NONE SEEN   Squamous Epithelial / LPF 0-5 0 - 5   Hyaline Casts, UA PRESENT    Amorphous Crystal PRESENT   Prepare RBC (crossmatch)     Status: None   Collection Time: 06/18/22 10:06 AM  Result Value Ref Range   Order Confirmation      ORDER PROCESSED BY BLOOD BANK Performed at [facility], [address] [place], [place] [address]   Type and screen [place]     Status: None  (Preliminary result)   Collection Time: 06/18/22 10:34 AM  Result Value Ref Range   ABO/RH(D) B POS    Antibody Screen NEG    Sample Expiration 06/21/2022,2359    Unit Number [id number]    Blood Component Type RED CELLS,LR    Unit division 00    Status of Unit REL FROM [facility]    Transfusion Status OK TO TRANSFUSE    Crossmatch Result Compatible    Unit Number [id number]    Blood Component Type RED CELLS,LR    Unit division 00    Status of Unit REL FROM [facility]    Transfusion Status OK TO TRANSFUSE    Crossmatch Result Compatible    Unit Number [id number]    Blood Component Type RED CELLS,LR    Unit division 00    Status of Unit ISSUED    Transfusion Status OK TO TRANSFUSE    Crossmatch Result Compatible    Unit Number [id number]    Blood Component Type RED CELLS,LR    Unit division 00    Status of Unit ISSUED    Transfusion Status OK TO TRANSFUSE    Crossmatch Result Compatible    Unit Number [id number]    Blood Component Type RED CELLS,LR    Unit division 00    Status of Unit ISSUED    Transfusion Status OK TO TRANSFUSE    Crossmatch Result Compatible    Unit Number [id number]    Blood Component Type RED CELLS,LR    Unit division 00    Status of Unit ALLOCATED    Transfusion Status OK TO TRANSFUSE    Crossmatch Result      Compatible Performed at [facility], [place] [address], [place], [place] [address]   Prepare RBC (crossmatch)     Status: None   Collection Time: 06/18/22  2:46 PM  Result Value Ref Range   Order Confirmation      ORDER PROCESSED BY BLOOD BANK Performed at [facility], [place] [address] [place], [place] [address]   Aerobic/Anaerobic Culture w  Gram Stain (surgical/deep wound)     Status: None (Preliminary result)   Collection Time: 06/18/22  3:04 PM   Specimen: PATH Other; Body Fluid  Result Value Ref Range   Specimen Description FLUID RIGHT THIGH    Special Requests PT ON ANCEF    Gram Stain      RARE WBC PRESENT,  PREDOMINANTLY PMN FEW GRAM POSITIVE COCCI FEW GRAM NEGATIVE RODS Performed at [facility], [place] [address] [place], [place] [address]    Culture PENDING    Report Status PENDING   Glucose, capillary     Status: Abnormal   Collection Time: 06/18/22  5:37 PM  Result Value Ref Range   Glucose-Capillary 110 (H) 70 - 99 mg/dL  Lactic acid, plasma     Status: Abnormal   Collection Time: 06/18/22  5:55 PM  Result Value Ref Range   Lactic Acid, Venous 2.4 (HH) 0.5 - 1.9 mmol/L  Basic metabolic panel     Status: Abnormal   Collection Time: 06/18/22  5:55 PM  Result Value Ref Range   Sodium 140 135 - 145 mmol/L   Potassium 5.1 3.5 - 5.1 mmol/L   Chloride 113 (H) 98 - 111 mmol/L   CO2 12 (L) 22 - 32 mmol/L   Glucose, Bld 110 (H) 70 - 99 mg/dL   BUN 63 (H) 6 - 20 mg/dL   Creatinine, Ser 5.15 (H) 0.44 - 1.00 mg/dL   Calcium 7.4 (L) 8.9 - 10.3 mg/dL   GFR, Estimated 10 (L) >60 mL/min   Anion gap 15 5 - 15  Hemoglobin and hematocrit, blood     Status: Abnormal   Collection Time: 06/18/22  5:55 PM  Result Value Ref Range   Hemoglobin 10.9 (L) 12.0 - 15.0 g/dL   HCT 32.2 (L) 36.0 - 46.0 %  MRSA Next Gen by PCR, Nasal     Status: None   Collection Time: 06/18/22  5:55 PM   Specimen: Nasal Mucosa; Nasal Swab  Result Value Ref Range   MRSA by PCR Next Gen NOT DETECTED NOT DETECTED  Glucose, capillary     Status: Abnormal   Collection Time: 06/18/22  8:41 PM  Result Value Ref Range   Glucose-Capillary 138 (H) 70 - 99 mg/dL  Glucose, capillary     Status: Abnormal   Collection Time: 06/19/22 12:10 AM  Result Value Ref Range   Glucose-Capillary 132 (H) 70 - 99 mg/dL  Comprehensive metabolic panel     Status: Abnormal   Collection Time: 06/19/22 12:56 AM  Result Value Ref Range   Sodium 139 135 - 145 mmol/L   Potassium 4.5 3.5 - 5.1 mmol/L   Chloride 112 (H) 98 - 111 mmol/L   CO2 14 (L) 22 - 32 mmol/L   Glucose, Bld 125 (H) 70 - 99 mg/dL   BUN 64 (H) 6 - 20 mg/dL   Creatinine,  Ser 5.33 (H) 0.44 - 1.00 mg/dL   Calcium 7.2 (L) 8.9 - 10.3 mg/dL   Total Protein 5.7 (L) 6.5 - 8.1 g/dL   Albumin 1.9 (L) 3.5 - 5.0 g/dL   AST 39 15 - 41 U/L   ALT 24 0 - 44 U/L   Alkaline Phosphatase 65 38 - 126 U/L   Total Bilirubin 1.6 (H) 0.3 - 1.2 mg/dL   GFR, Estimated 10 (L) >60 mL/min   Anion gap 13 5 - 15  CBC     Status: Abnormal   Collection Time: 06/19/22 12:56 AM  Result Value Ref Range   WBC 24.0 (H) 4.0 - 10.5 K/uL   RBC 3.40 (L) 3.87 - 5.11 MIL/uL   Hemoglobin 10.4 (L) 12.0 - 15.0 g/dL   HCT 30.0 (L) 36.0 - 46.0 %   MCV 88.2 80.0 - 100.0 fL   MCH 30.6 26.0 - 34.0 pg   MCHC 34.7 30.0 - 36.0 g/dL   RDW 14.9 11.5 - 15.5 %   Platelets 255 150 - 400 K/uL   nRBC 0.1 0.0 - 0.2 %  Magnesium     Status: None   Collection Time: 06/19/22 12:56 AM  Result Value Ref Range   Magnesium 1.8 1.7 - 2.4 mg/dL  Phosphorus     Status: Abnormal   Collection Time: 06/19/22 12:56 AM  Result Value Ref Range   Phosphorus 6.4 (H) 2.5 - 4.6 mg/dL    Assessment & Plan:  Present on Admission:  Fasciitis    LOS: 1 day   Additional comments:I reviewed the patient's new clinical lab test results.   and I reviewed the patients new imaging test results.    Right thigh/retroperitoneal necrotizing fasciitis  - s/p operative I&D with Dr. Kieth Brightly and Dr. Mable Fill 12/2  - continue broad spectrum antibiotics, await culture data   ID - zyvox/mero day 2 VTE - SCDs FEN - NPO Foley - none   Acute on CKD Acute on chronic anemia HTN H/o gastric bypass in 2020 at [facility] Please use [email or web address] to contact on call provider  06/19/2022  *Care during the described time interval was provided by me. I have reviewed this patient's available data, including medical history, events of note, physical examination and test results as part of my evaluation.

## 2022-06-19 NOTE — Consult Note (Signed)
NAMEWendy [name], MRN:  [id number], DOB:  1981/03/04, LOS: 1 ADMISSION DATE:  06/18/2022, CONSULTATION DATE: 06/18/2022 REFERRING MD: Dr. Kieth Brightly, CHIEF COMPLAINT: Sepsis  History of Present Illness:  41 year old woman with a history of diabetes, hypertension, obesity with OSA, CKD.  She has a history of a gastric bypass in 2020.  She has experienced 2 days of worsening right groin and right leg pain, ultimately to the point where she could not ambulate and was having difficulty moving her leg.  She was evaluated in the emergency department and by [facility].  A CT abdomen and pelvis showed evidence for necrotizing fasciitis in the right thigh with a masslike opacity in the right pelvic sidewall in the psoas musculature. CT of the right lower extremity confirmed gas in the anterior compartment of the right thigh, diffuse cellulitis, chronic appearing bony changes of the midfoot.  She was started on Zosyn and went urgently to the OR for surgical debridement by CCS and orthopedics.  She underwent debridement of skin, muscle, fascia and abdominal wall and retroperitoneum.  She was able to be extubated and taken to the PACU. In the PACU she is still sleepy but neurologically intact, hemodynamically stable.  Adequate saturations.  She will be admitted to the ICU for further care.  Pertinent  Medical History   Past Medical History:  Diagnosis Date   Anemia    Anxiety    Chronic bronchitis (HCC)    Hypertension    Increased frequency of headaches    Morbid obesity ([place])    Sleep apnea    Type II diabetes mellitus ([place])     Significant Hospital Events: Including procedures, antibiotic start and stop dates in addition to other pertinent events   12/2 surgical debridement right lower extremity, pelvis, retroperitoneal necrotizing fasciitis  Interim History / Subjective:  Sodium bicarbonate was started overnight, currently 50 cc/h Serum creatinine elevated but stable, urine output 525 cc last shift,  100 cc so far today (4 hours) Hyperkalemic, Lokelma given this morning Plan to go back to the operating room for further debridement this week   Objective   Blood pressure 103/63, pulse 85, temperature 97.7 F (36.5 C), temperature source Oral, resp. rate 20, height 5\' 5"  (1.651 m), weight 91.5 kg, SpO2 99 %.        Intake/Output Summary (Last 24 hours) at 06/19/2022 1044 Last data filed at 06/19/2022 1000 Gross per 24 hour  Intake 2347.62 ml  Output 775 ml  Net 1572.62 ml   Filed Weights   06/18/22 1343 06/18/22 1730  Weight: 82.6 kg 91.5 kg    Examination: General: Obese woman sitting up in bed, comfortable, no distress on room air HENT: Oropharynx clear, pupils equal, no stridor, no secretions Lungs: Clear bilaterally Cardiovascular: Distant, regular, no murmur Abdomen: Nondistended with positive bowel sounds Extremities: Dressing on right thigh that extends to hip and flank, clean and dry Neuro: Awake, alert, interacting appropriately, follows commands   Resolved Hospital Problem list     Assessment & Plan:   Necrotizing fasciitis right lower extremity, debridement 12/2 Sepsis -Continue linezolid, meropenem as ordered -Would restart gentle IV fluids once sodium bicarbonate infusion is completed -Blood cultures and wound cultures ordered and pending.  Wound culture with few GPC, few GNR -Pain control  Acute on chronic renal failure, due to sepsis, ATN Metabolic acidosis Hyperkalemia -Sodium bicarbonate infusion ordered for 12 hours, plan to complete -Lokelma given now, follow potassium -Follow urine output, BMP -Home Lasix and Bumex are on  hold -Ensure adequate renal perfusion -Renal dose medications and renal diet -We will consult nephrology if any evidence of decline, progressive acidosis or hyperkalemia  DM Type II w hyperglycemia -Change hyperglycemia protocol to meals and at bedtime  Hx Hypertension -Home antihypertensive regimen on hold, diuretics on  hold  Depression -Restart sertraline   Best Practice (right click and "Reselect all SmartList Selections" daily)   Diet/type: Regular consistency (see orders) DVT prophylaxis: prophylactic heparin  GI prophylaxis: PPI Lines: Central line Foley:  Yes, and it is still needed Code Status:  full code Last date of multidisciplinary goals of care discussion pending  Labs   CBC: Recent Labs  Lab 06/18/22 0750 06/18/22 1755 06/19/22 0056  WBC 27.8*  --  24.0*  NEUTROABS 26.7*  --   --   HGB 5.9* 10.9* 10.4*  HCT 18.7* 32.2* 30.0*  MCV 91.7  --  88.2  PLT 353  --  295    Basic Metabolic Panel: Recent Labs  Lab 06/18/22 0750 06/18/22 1755 06/19/22 0056 06/19/22 0749  NA 138 140 139 135  K 4.1 5.1 4.5 5.8*  CL 111 113* 112* 110  CO2 16* 12* 14* 13*  GLUCOSE 171* 110* 125* 126*  BUN 61* 63* 64* 65*  CREATININE 5.57* 5.15* 5.33* 5.20*  CALCIUM 7.3* 7.4* 7.2* 6.9*  MG  --   --  1.8  --   PHOS  --   --  6.4*  --    GFR: Estimated Creatinine Clearance: 15.8 mL/min (A) (by C-G formula based on SCr of 5.2 mg/dL (H)). Recent Labs  Lab 06/18/22 0750 06/18/22 0754 06/18/22 1755 06/19/22 0056  WBC 27.8*  --   --  24.0*  LATICACIDVEN  --  1.9 2.4*  --     Liver Function Tests: Recent Labs  Lab 06/18/22 0750 06/19/22 0056  AST 23 39  ALT 21 24  ALKPHOS 71 65  BILITOT 0.7 1.6*  PROT 6.0* 5.7*  ALBUMIN 1.7* 1.9*   No results for input(s): "LIPASE", "AMYLASE" in the last 168 hours. No results for input(s): "AMMONIA" in the last 168 hours.  ABG    Component Value Date/Time   HCO3 21.6 01/31/2017 1620   TCO2 23 01/31/2017 1620   ACIDBASEDEF 2.0 01/31/2017 1620   O2SAT 69.0 01/31/2017 1620     Coagulation Profile: No results for input(s): "INR", "PROTIME" in the last 168 hours.  Cardiac Enzymes: No results for input(s): "CKTOTAL", "CKMB", "CKMBINDEX", "TROPONINI" in the last 168 hours.  HbA1C: Hgb A1c MFr Bld  Date/Time Value Ref Range Status   12/02/2019 08:38 AM 6.5 (H) 4.8 - 5.6 % Final    Comment:    (NOTE)         Prediabetes: 5.7 - 6.4         Diabetes: >6.4         Glycemic control for adults with diabetes: <7.0   05/04/2018 02:17 AM 10.2 (H) 4.8 - 5.6 % Final    Comment:    (NOTE) Pre diabetes:          5.7%-6.4% Diabetes:              >6.4% Glycemic control for   <7.0% adults with diabetes     CBG: Recent Labs  Lab 06/19/22 0010 06/19/22 0439 06/19/22 0440 06/19/22 0522 06/19/22 0833  GLUCAP 132* 69* 75 110* 118*     Critical care time: NA     Baltazar Apo, MD, PhD 06/19/2022, 10:44 AM [facility] [phone] or if no answer before 7:00PM call [phone] For any issues after 7:00PM please call eLink [phone]

## 2022-06-19 NOTE — Progress Notes (Signed)
Patient had numerous visitors today.   RLE vascular intact with strong dorsalis pedis pulse. Increasing strength. Some residual upper thigh numbness. ABDs replaced when saturated with serosang drainage (required x 2 during day shift- 12 hrs).  Hydromorphone mainly for pain due to prior gastric bypass with poor tolerance to some oral meds.Pain well controlled, about total 3mg  hydromorphone IV in the last 12 hours.   Patient ate well and drank well today.   URINE OUTPUT POOR. 30cc per hour average but BUN/creat. Markedly elevated and this appears to be a chronic issue with some acute compounded. Discussed with the patient who states she sees a nephrologist outpatient.   Patient states that her DM was reversed or controlled with gastric bypass several years ago.   No present issues with hypertension even though off home meds.   NO BM yet post lokemia admin as ordered for elevated potassium. No c/o chest pain. No arrythmias noted.   Consent in chart and signed by patient for surgery in AM, patient will be made NPO after midnight.

## 2022-06-20 ENCOUNTER — Encounter (HOSPITAL_COMMUNITY): Payer: Self-pay | Admitting: General Surgery

## 2022-06-20 ENCOUNTER — Inpatient Hospital Stay (HOSPITAL_COMMUNITY): Payer: BC Managed Care – PPO | Admitting: Anesthesiology

## 2022-06-20 ENCOUNTER — Other Ambulatory Visit: Payer: Self-pay

## 2022-06-20 ENCOUNTER — Encounter (HOSPITAL_COMMUNITY)
Admission: EM | Disposition: A | Discharge status: Other Acute Inpatient Hospital | Payer: Self-pay | Source: Home / Self Care | Attending: Internal Medicine

## 2022-06-20 DIAGNOSIS — M729 Fibroblastic disorder, unspecified: Secondary | ICD-10-CM | POA: Diagnosis not present

## 2022-06-20 HISTORY — PX: I & D EXTREMITY: SHX5045

## 2022-06-20 LAB — BASIC METABOLIC PANEL
Anion gap: 15 (ref 5–15)
BUN: 76 mg/dL — ABNORMAL HIGH (ref 6–20)
CO2: 12 mmol/L — ABNORMAL LOW (ref 22–32)
Calcium: 6.8 mg/dL — ABNORMAL LOW (ref 8.9–10.3)
Chloride: 107 mmol/L (ref 98–111)
Creatinine, Ser: 5.86 mg/dL — ABNORMAL HIGH (ref 0.44–1.00)
GFR, Estimated: 9 mL/min — ABNORMAL LOW (ref >60)
Glucose, Bld: 179 mg/dL — ABNORMAL HIGH (ref 70–99)
Potassium: 4.8 mmol/L (ref 3.5–5.1)
Sodium: 134 mmol/L — ABNORMAL LOW (ref 135–145)

## 2022-06-20 LAB — MAGNESIUM: Magnesium: 1.8 mg/dL (ref 1.7–2.4)

## 2022-06-20 LAB — BLOOD GAS, VENOUS
Acid-base deficit: 11.6 mmol/L — ABNORMAL HIGH (ref 0.0–2.0)
Bicarbonate: 15.1 mmol/L — ABNORMAL LOW (ref 20.0–28.0)
O2 Saturation: 79.4 %
Patient temperature: 36.5
pCO2, Ven: 35 mmHg — ABNORMAL LOW (ref 44–60)
pH, Ven: 7.24 — ABNORMAL LOW (ref 7.25–7.43)
pO2, Ven: 51 mmHg — ABNORMAL HIGH (ref 32–45)

## 2022-06-20 LAB — CBC
HCT: 27.6 % — ABNORMAL LOW (ref 36.0–46.0)
Hemoglobin: 9.2 g/dL — ABNORMAL LOW (ref 12.0–15.0)
MCH: 29.8 pg (ref 26.0–34.0)
MCHC: 33.3 g/dL (ref 30.0–36.0)
MCV: 89.3 fL (ref 80.0–100.0)
Platelets: 219 10*3/uL (ref 150–400)
RBC: 3.09 MIL/uL — ABNORMAL LOW (ref 3.87–5.11)
RDW: 15.4 % (ref 11.5–15.5)
WBC: 24.8 10*3/uL — ABNORMAL HIGH (ref 4.0–10.5)
nRBC: 0.2 % (ref 0.0–0.2)

## 2022-06-20 LAB — GLUCOSE, CAPILLARY
Glucose-Capillary: 124 mg/dL — ABNORMAL HIGH (ref 70–99)
Glucose-Capillary: 115 mg/dL — ABNORMAL HIGH (ref 70–99)
Glucose-Capillary: 125 mg/dL — ABNORMAL HIGH (ref 70–99)
Glucose-Capillary: 161 mg/dL — ABNORMAL HIGH (ref 70–99)
Glucose-Capillary: 174 mg/dL — ABNORMAL HIGH (ref 70–99)

## 2022-06-20 LAB — BASIC METABOLIC PANEL WITH GFR
Anion gap: 12 (ref 5–15)
BUN: 74 mg/dL — ABNORMAL HIGH (ref 6–20)
CO2: 15 mmol/L — ABNORMAL LOW (ref 22–32)
Calcium: 6.5 mg/dL — ABNORMAL LOW (ref 8.9–10.3)
Chloride: 107 mmol/L (ref 98–111)
Creatinine, Ser: 5.55 mg/dL — ABNORMAL HIGH (ref 0.44–1.00)
GFR, Estimated: 9 mL/min — ABNORMAL LOW
Glucose, Bld: 108 mg/dL — ABNORMAL HIGH (ref 70–99)
Potassium: 4.3 mmol/L (ref 3.5–5.1)
Sodium: 134 mmol/L — ABNORMAL LOW (ref 135–145)

## 2022-06-20 LAB — PHOSPHORUS: Phosphorus: 6.2 mg/dL — ABNORMAL HIGH (ref 2.5–4.6)

## 2022-06-20 SURGERY — IRRIGATION AND DEBRIDEMENT EXTREMITY
Anesthesia: General | Laterality: Right

## 2022-06-20 MED ORDER — SODIUM CHLORIDE 0.9 % IV SOLN: INTRAVENOUS | Status: DC

## 2022-06-20 MED ORDER — EPHEDRINE SULFATE-NACL 50-0.9 MG/10ML-% IV SOSY
PREFILLED_SYRINGE | INTRAVENOUS | Status: DC | PRN
  Administered 2022-06-20: 10 mg via INTRAVENOUS

## 2022-06-20 MED ORDER — SODIUM BICARBONATE 650 MG PO TABS
1300.0000 mg | ORAL_TABLET | Freq: Two times a day (BID) | ORAL | Status: DC
  Administered 2022-06-20 – 2022-06-29 (×18): 1300 mg via ORAL
  Filled 2022-06-20 (×18): qty 2

## 2022-06-20 MED ORDER — HYDROMORPHONE HCL 1 MG/ML IJ SOLN: 0.5000 mg | INTRAMUSCULAR | Status: DC | PRN

## 2022-06-20 MED ORDER — LIDOCAINE 2% (20 MG/ML) 5 ML SYRINGE
INTRAMUSCULAR | Status: AC
  Filled 2022-06-20: qty 5

## 2022-06-20 MED ORDER — CHLORHEXIDINE GLUCONATE 0.12 % MT SOLN: 15.0000 mL | Freq: Once | OROMUCOSAL | Status: AC

## 2022-06-20 MED ORDER — FENTANYL CITRATE (PF) 250 MCG/5ML IJ SOLN
INTRAMUSCULAR | Status: AC
  Filled 2022-06-20: qty 5

## 2022-06-20 MED ORDER — CHLORHEXIDINE GLUCONATE 0.12 % MT SOLN
OROMUCOSAL | Status: AC
  Administered 2022-06-20: 15 mL via OROMUCOSAL
  Filled 2022-06-20: qty 15

## 2022-06-20 MED ORDER — MIDAZOLAM HCL 2 MG/2ML IJ SOLN
INTRAMUSCULAR | Status: DC | PRN
  Administered 2022-06-20: 2 mg via INTRAVENOUS

## 2022-06-20 MED ORDER — ROCURONIUM BROMIDE 10 MG/ML (PF) SYRINGE
PREFILLED_SYRINGE | INTRAVENOUS | Status: AC
  Filled 2022-06-20: qty 10

## 2022-06-20 MED ORDER — ONDANSETRON HCL 4 MG/2ML IJ SOLN: 4.0000 mg | Freq: Once | INTRAMUSCULAR | Status: AC

## 2022-06-20 MED ORDER — MIDAZOLAM HCL 2 MG/2ML IJ SOLN
INTRAMUSCULAR | Status: AC
  Filled 2022-06-20: qty 2

## 2022-06-20 MED ORDER — DARBEPOETIN ALFA 100 MCG/0.5ML IJ SOSY
100.0000 ug | PREFILLED_SYRINGE | INTRAMUSCULAR | Status: DC
  Administered 2022-06-20 – 2022-06-27 (×2): 100 ug via SUBCUTANEOUS
  Filled 2022-06-20 (×4): qty 0.5

## 2022-06-20 MED ORDER — SODIUM CHLORIDE 0.9 % IV SOLN: INTRAVENOUS | Status: AC

## 2022-06-20 MED ORDER — FENTANYL CITRATE (PF) 250 MCG/5ML IJ SOLN
INTRAMUSCULAR | Status: DC | PRN
  Administered 2022-06-20 (×2): 50 ug via INTRAVENOUS

## 2022-06-20 MED ORDER — PHENYLEPHRINE 80 MCG/ML (10ML) SYRINGE FOR IV PUSH (FOR BLOOD PRESSURE SUPPORT)
PREFILLED_SYRINGE | INTRAVENOUS | Status: DC | PRN
  Administered 2022-06-20 (×2): 240 ug via INTRAVENOUS
  Administered 2022-06-20: 80 ug via INTRAVENOUS

## 2022-06-20 MED ORDER — LIDOCAINE 2% (20 MG/ML) 5 ML SYRINGE
INTRAMUSCULAR | Status: DC | PRN
  Administered 2022-06-20: 80 mg via INTRAVENOUS

## 2022-06-20 MED ORDER — PROPOFOL 10 MG/ML IV BOLUS
INTRAVENOUS | Status: DC | PRN
  Administered 2022-06-20: 150 mg via INTRAVENOUS

## 2022-06-20 MED ORDER — ORAL CARE MOUTH RINSE: 15.0000 mL | Freq: Once | OROMUCOSAL | Status: AC

## 2022-06-20 MED ORDER — INSULIN ASPART 100 UNIT/ML IJ SOLN: 0.0000 [IU] | INTRAMUSCULAR | Status: DC | PRN

## 2022-06-20 MED ORDER — HYDROMORPHONE HCL 1 MG/ML IJ SOLN
0.2500 mg | INTRAMUSCULAR | Status: DC | PRN
  Administered 2022-06-20: 0.25 mg via INTRAVENOUS

## 2022-06-20 MED ORDER — PROPOFOL 10 MG/ML IV BOLUS
INTRAVENOUS | Status: AC
  Filled 2022-06-20: qty 20

## 2022-06-20 MED ORDER — DEXAMETHASONE SODIUM PHOSPHATE 10 MG/ML IJ SOLN
INTRAMUSCULAR | Status: DC | PRN
  Administered 2022-06-20: 5 mg via INTRAVENOUS

## 2022-06-20 MED ORDER — HYDROMORPHONE HCL 1 MG/ML IJ SOLN
INTRAMUSCULAR | Status: AC
  Filled 2022-06-20: qty 1

## 2022-06-20 MED ORDER — SODIUM CHLORIDE 0.9 % IR SOLN
Status: DC | PRN
  Administered 2022-06-20: 1000 mL

## 2022-06-20 MED ORDER — ONDANSETRON HCL 4 MG/2ML IJ SOLN
INTRAMUSCULAR | Status: DC | PRN
  Administered 2022-06-20: 4 mg via INTRAVENOUS

## 2022-06-20 SURGICAL SUPPLY — 29 items
BAG COUNTER SPONGE SURGICOUNT (BAG) ×2 IMPLANT
BAG SPNG CNTER NS LX DISP (BAG) ×2
BNDG GAUZE DERMACEA FLUFF 4 (GAUZE/BANDAGES/DRESSINGS) ×2 IMPLANT
BNDG GZE DERMACEA 4 6PLY (GAUZE/BANDAGES/DRESSINGS) ×4
CANISTER SUCT 3000ML PPV (MISCELLANEOUS) ×2 IMPLANT
COVER SURGICAL LIGHT HANDLE (MISCELLANEOUS) ×2 IMPLANT
DRAPE UTILITY XL STRL (DRAPES) ×4 IMPLANT
ELECT CAUTERY BLADE 6.4 (BLADE) IMPLANT
ELECT REM PT RETURN 9FT ADLT (ELECTROSURGICAL)
ELECTRODE REM PT RTRN 9FT ADLT (ELECTROSURGICAL) IMPLANT
GAUZE 4X4 16PLY ~~LOC~~+RFID DBL (SPONGE) ×2 IMPLANT
GAUZE PAD ABD 8X10 STRL (GAUZE/BANDAGES/DRESSINGS) ×3 IMPLANT
GAUZE SPONGE 4X4 12PLY STRL (GAUZE/BANDAGES/DRESSINGS) ×2 IMPLANT
GLOVE BIO SURGEON STRL SZ7.5 (GLOVE) ×2 IMPLANT
GOWN STRL REUS W/ TWL LRG LVL3 (GOWN DISPOSABLE) ×4 IMPLANT
GOWN STRL REUS W/TWL LRG LVL3 (GOWN DISPOSABLE) ×4
KIT BASIN OR (CUSTOM PROCEDURE TRAY) ×2 IMPLANT
KIT TURNOVER KIT B (KITS) ×2 IMPLANT
NS IRRIG 1000ML POUR BTL (IV SOLUTION) ×2 IMPLANT
PACK GENERAL/GYN (CUSTOM PROCEDURE TRAY) ×1 IMPLANT
PACK LITHOTOMY IV (CUSTOM PROCEDURE TRAY) ×2 IMPLANT
PACKING GAUZE IODOFORM 1INX5YD (GAUZE/BANDAGES/DRESSINGS) IMPLANT
PAD ARMBOARD 7.5X6 YLW CONV (MISCELLANEOUS) ×4 IMPLANT
TAPE CLOTH SURG 4X10 WHT LF (GAUZE/BANDAGES/DRESSINGS) ×1 IMPLANT
TOWEL GREEN STERILE (TOWEL DISPOSABLE) ×2 IMPLANT
TOWEL GREEN STERILE FF (TOWEL DISPOSABLE) ×2 IMPLANT
TUBE CONNECTING 12X1/4 (SUCTIONS) ×2 IMPLANT
UNDERPAD 30X36 HEAVY ABSORB (UNDERPADS AND DIAPERS) ×2 IMPLANT
YANKAUER SUCT BULB TIP NO VENT (SUCTIONS) ×2 IMPLANT

## 2022-06-20 NOTE — Plan of Care (Signed)

## 2022-06-20 NOTE — Progress Notes (Signed)
   06/20/22 0100  Provider Notification  Provider Name/Title Elink  Date Provider Notified 06/20/22  Time Provider Notified 0100  Method of Notification Page  Notification Reason Change in status (Vomiting and low urine output)  Provider response See new orders (NS 100 ml/hr)  Date of Provider Response 06/20/22  Time of Provider Response [phone]

## 2022-06-20 NOTE — Progress Notes (Signed)
Day of Surgery   Subjective/Chief Complaint: No compliants   Objective: Vital signs in last 24 hours: Temp:  [97.4 F (36.3 C)-98.3 F (36.8 C)] 97.4 F (36.3 C) (12/04 0807) Pulse Rate:  [77-99] 77 (12/04 0807) Resp:  [12-23] 14 (12/04 0807) BP: (94-129)/(50-86) 112/70 (12/04 0807) SpO2:  [94 %-100 %] 98 % (12/04 0807) Last BM Date : 06/19/22  Intake/Output from previous day: 12/03 0701 - 12/04 0700 In: 1618 [P.O.:200; I.V.:618; IV Piggyback:800] Out: 515 [Urine:515] Intake/Output this shift: No intake/output data recorded.  General appearance: alert and cooperative Resp: clear to auscultation bilaterally Cardio: regular rate and rhythm GI: soft, nontender  Lab Results:  Recent Labs    06/19/22 0056 06/20/22 0448  WBC 24.0* 24.8*  HGB 10.4* 9.2*  HCT 30.0* 27.6*  PLT 255 219   BMET Recent Labs    06/19/22 1807 06/20/22 0448  NA 138 134*  K 4.4 4.3  CL 108 107  CO2 14* 15*  GLUCOSE 109* 108*  BUN 68* 74*  CREATININE 5.53* 5.55*  CALCIUM 7.1* 6.5*   PT/INR No results for input(s): "LABPROT", "INR" in the last 72 hours. ABG No results for input(s): "PHART", "HCO3" in the last 72 hours.  Invalid input(s): "PCO2", "PO2"  Studies/Results: CT EXTREMITY LOWER RIGHT WO CONTRAST  Result Date: 06/18/2022 CLINICAL DATA:  Necrotizing fasciitis EXAM: CT OF THE LOWER RIGHT EXTREMITY WITHOUT CONTRAST TECHNIQUE: Multidetector CT imaging of the right lower extremity was performed according to the standard protocol. Images obtained of the right femur, right tibia-fibula, and right foot. RADIATION DOSE REDUCTION: This exam was performed according to the departmental dose-optimization program which includes automated exposure control, adjustment of the mA and/or kV according to patient size and/or use of iterative reconstruction technique. COMPARISON:  Foot x-ray 06/18/2022.  Abdominopelvic CT 06/18/2022 FINDINGS: RIGHT FEMUR: Bones/Joint/Cartilage No acute fracture or  dislocation. Right hip joint is intact with mild joint space narrowing. No appreciable right hip joint effusion. No intra-articular gas in the right hip joint. No erosion or periosteal elevation. Knee joint alignment is maintained. Small-moderate size knee joint effusion, nonspecific. Ligaments Suboptimally assessed by CT. Muscles and Tendons Numerous foci of gas within the anterior compartment of the right thigh. Gas is predominantly located along the fascial planes where there is extensive fat stranding and trace fluid. There are also foci of gas located intramuscularly, notably involving the right sartorius and right iliopsoas muscles. Gas present within the sartorius muscle at the level of the distal femoral metaphysis (series 4, image 222). No well-defined intramuscular fluid collection. Grossly intact tendinous structures. Soft tissues Circumferential edema and ill-defined fluid throughout the right thigh. Soft tissue gas within the subcutaneous soft tissues of the right inguinal canal and anteromedial aspect of the proximal thigh. Enlarged right inguinal and right external iliac lymph nodes are likely reactive. Partially imaged complex collection in the pelvis along the right pelvic sidewall, better characterized by same day abdominopelvic CT. RIGHT TIBIA/FIBULA: Bones/Joint/Cartilage Tibia and fibula are intact without fracture or dislocation. No erosion or periosteal elevation. No tibiotalar joint effusion. Ligaments Suboptimally assessed by CT. Muscles and Tendons No intramuscular fluid collection within the lower leg musculature. No fascial or intramuscular gas. Soft tissues Circumferential subcutaneous edema and ill-defined fluid within the soft tissues of the lower leg. No organized fluid collection. No soft tissue gas. RIGHT FOOT: Bones/Joint/Cartilage Chronic-appearing bony changes of the midfoot which may be posttraumatic, neuropathic, or secondary to septic arthritis with prominent sclerosis and  subchondral cysts or erosions and areas  of bony fragmentation. Bulky callus formation of the second through fourth metatarsals. No findings of an acute fracture or dislocation. Detailed evaluation is limited on large field-of-view whole lower extremity images. Ligaments Suboptimally assessed by CT. Muscles and Tendons Fatty atrophy of the foot musculature, likely related to denervation. No intramuscular gas. Soft tissues Generalized soft tissue swelling of the foot with fluid slightly more confluent along the medial aspect of the midfoot measuring approximately 3.3 x 1.3 cm (series 4, image 452). No discernible soft tissue wound or ulceration. No soft tissue gas within the foot. IMPRESSION: 1. Numerous foci of gas within the anterior compartment of the right thigh. Gas is predominantly located along the fascial planes where there is extensive fat stranding and trace fluid. There are also foci of gas located within the right sartorius muscle and right iliopsoas musculature. Intramuscular gas within the sartorius muscle is seen tracking inferiorly to the level of the distal femoral metaphysis. Findings are consistent with necrotizing fasciitis and infectious myositis. 2. Findings of diffuse cellulitis of the right lower extremity extending from the proximal thigh to the foot. Gas within the superficial soft tissues is localized to the right inguinal region and anteromedial aspect of the proximal thigh. 3. Chronic-appearing bony changes of the midfoot which may be posttraumatic, neuropathic, and/or secondary to septic arthritis. 4. Partially imaged complex collection in the pelvis along the right pelvic sidewall, better characterized by same day abdominopelvic CT. 5. Small-moderate size knee joint effusion, nonspecific. 6. Enlarged right inguinal and right external iliac lymph nodes are likely reactive. Electronically Signed   By: Davina Poke D.O.   On: 06/18/2022 12:54   CT FEMUR RIGHT WO CONTRAST  Result  Date: 06/18/2022 CLINICAL DATA:  Necrotizing fasciitis EXAM: CT OF THE LOWER RIGHT EXTREMITY WITHOUT CONTRAST TECHNIQUE: Multidetector CT imaging of the right lower extremity was performed according to the standard protocol. Images obtained of the right femur, right tibia-fibula, and right foot. RADIATION DOSE REDUCTION: This exam was performed according to the departmental dose-optimization program which includes automated exposure control, adjustment of the mA and/or kV according to patient size and/or use of iterative reconstruction technique. COMPARISON:  Foot x-ray 06/18/2022.  Abdominopelvic CT 06/18/2022 FINDINGS: RIGHT FEMUR: Bones/Joint/Cartilage No acute fracture or dislocation. Right hip joint is intact with mild joint space narrowing. No appreciable right hip joint effusion. No intra-articular gas in the right hip joint. No erosion or periosteal elevation. Knee joint alignment is maintained. Small-moderate size knee joint effusion, nonspecific. Ligaments Suboptimally assessed by CT. Muscles and Tendons Numerous foci of gas within the anterior compartment of the right thigh. Gas is predominantly located along the fascial planes where there is extensive fat stranding and trace fluid. There are also foci of gas located intramuscularly, notably involving the right sartorius and right iliopsoas muscles. Gas present within the sartorius muscle at the level of the distal femoral metaphysis (series 4, image 222). No well-defined intramuscular fluid collection. Grossly intact tendinous structures. Soft tissues Circumferential edema and ill-defined fluid throughout the right thigh. Soft tissue gas within the subcutaneous soft tissues of the right inguinal canal and anteromedial aspect of the proximal thigh. Enlarged right inguinal and right external iliac lymph nodes are likely reactive. Partially imaged complex collection in the pelvis along the right pelvic sidewall, better characterized by same day  abdominopelvic CT. RIGHT TIBIA/FIBULA: Bones/Joint/Cartilage Tibia and fibula are intact without fracture or dislocation. No erosion or periosteal elevation. No tibiotalar joint effusion. Ligaments Suboptimally assessed by CT. Muscles and Tendons No intramuscular  fluid collection within the lower leg musculature. No fascial or intramuscular gas. Soft tissues Circumferential subcutaneous edema and ill-defined fluid within the soft tissues of the lower leg. No organized fluid collection. No soft tissue gas. RIGHT FOOT: Bones/Joint/Cartilage Chronic-appearing bony changes of the midfoot which may be posttraumatic, neuropathic, or secondary to septic arthritis with prominent sclerosis and subchondral cysts or erosions and areas of bony fragmentation. Bulky callus formation of the second through fourth metatarsals. No findings of an acute fracture or dislocation. Detailed evaluation is limited on large field-of-view whole lower extremity images. Ligaments Suboptimally assessed by CT. Muscles and Tendons Fatty atrophy of the foot musculature, likely related to denervation. No intramuscular gas. Soft tissues Generalized soft tissue swelling of the foot with fluid slightly more confluent along the medial aspect of the midfoot measuring approximately 3.3 x 1.3 cm (series 4, image 452). No discernible soft tissue wound or ulceration. No soft tissue gas within the foot. IMPRESSION: 1. Numerous foci of gas within the anterior compartment of the right thigh. Gas is predominantly located along the fascial planes where there is extensive fat stranding and trace fluid. There are also foci of gas located within the right sartorius muscle and right iliopsoas musculature. Intramuscular gas within the sartorius muscle is seen tracking inferiorly to the level of the distal femoral metaphysis. Findings are consistent with necrotizing fasciitis and infectious myositis. 2. Findings of diffuse cellulitis of the right lower extremity  extending from the proximal thigh to the foot. Gas within the superficial soft tissues is localized to the right inguinal region and anteromedial aspect of the proximal thigh. 3. Chronic-appearing bony changes of the midfoot which may be posttraumatic, neuropathic, and/or secondary to septic arthritis. 4. Partially imaged complex collection in the pelvis along the right pelvic sidewall, better characterized by same day abdominopelvic CT. 5. Small-moderate size knee joint effusion, nonspecific. 6. Enlarged right inguinal and right external iliac lymph nodes are likely reactive. Electronically Signed   By: Davina Poke D.O.   On: 06/18/2022 12:54   CT TIBIA FIBULA RIGHT WO CONTRAST  Result Date: 06/18/2022 CLINICAL DATA:  Necrotizing fasciitis EXAM: CT OF THE LOWER RIGHT EXTREMITY WITHOUT CONTRAST TECHNIQUE: Multidetector CT imaging of the right lower extremity was performed according to the standard protocol. Images obtained of the right femur, right tibia-fibula, and right foot. RADIATION DOSE REDUCTION: This exam was performed according to the departmental dose-optimization program which includes automated exposure control, adjustment of the mA and/or kV according to patient size and/or use of iterative reconstruction technique. COMPARISON:  Foot x-ray 06/18/2022.  Abdominopelvic CT 06/18/2022 FINDINGS: RIGHT FEMUR: Bones/Joint/Cartilage No acute fracture or dislocation. Right hip joint is intact with mild joint space narrowing. No appreciable right hip joint effusion. No intra-articular gas in the right hip joint. No erosion or periosteal elevation. Knee joint alignment is maintained. Small-moderate size knee joint effusion, nonspecific. Ligaments Suboptimally assessed by CT. Muscles and Tendons Numerous foci of gas within the anterior compartment of the right thigh. Gas is predominantly located along the fascial planes where there is extensive fat stranding and trace fluid. There are also foci of gas  located intramuscularly, notably involving the right sartorius and right iliopsoas muscles. Gas present within the sartorius muscle at the level of the distal femoral metaphysis (series 4, image 222). No well-defined intramuscular fluid collection. Grossly intact tendinous structures. Soft tissues Circumferential edema and ill-defined fluid throughout the right thigh. Soft tissue gas within the subcutaneous soft tissues of the right inguinal canal and anteromedial aspect  of the proximal thigh. Enlarged right inguinal and right external iliac lymph nodes are likely reactive. Partially imaged complex collection in the pelvis along the right pelvic sidewall, better characterized by same day abdominopelvic CT. RIGHT TIBIA/FIBULA: Bones/Joint/Cartilage Tibia and fibula are intact without fracture or dislocation. No erosion or periosteal elevation. No tibiotalar joint effusion. Ligaments Suboptimally assessed by CT. Muscles and Tendons No intramuscular fluid collection within the lower leg musculature. No fascial or intramuscular gas. Soft tissues Circumferential subcutaneous edema and ill-defined fluid within the soft tissues of the lower leg. No organized fluid collection. No soft tissue gas. RIGHT FOOT: Bones/Joint/Cartilage Chronic-appearing bony changes of the midfoot which may be posttraumatic, neuropathic, or secondary to septic arthritis with prominent sclerosis and subchondral cysts or erosions and areas of bony fragmentation. Bulky callus formation of the second through fourth metatarsals. No findings of an acute fracture or dislocation. Detailed evaluation is limited on large field-of-view whole lower extremity images. Ligaments Suboptimally assessed by CT. Muscles and Tendons Fatty atrophy of the foot musculature, likely related to denervation. No intramuscular gas. Soft tissues Generalized soft tissue swelling of the foot with fluid slightly more confluent along the medial aspect of the midfoot measuring  approximately 3.3 x 1.3 cm (series 4, image 452). No discernible soft tissue wound or ulceration. No soft tissue gas within the foot. IMPRESSION: 1. Numerous foci of gas within the anterior compartment of the right thigh. Gas is predominantly located along the fascial planes where there is extensive fat stranding and trace fluid. There are also foci of gas located within the right sartorius muscle and right iliopsoas musculature. Intramuscular gas within the sartorius muscle is seen tracking inferiorly to the level of the distal femoral metaphysis. Findings are consistent with necrotizing fasciitis and infectious myositis. 2. Findings of diffuse cellulitis of the right lower extremity extending from the proximal thigh to the foot. Gas within the superficial soft tissues is localized to the right inguinal region and anteromedial aspect of the proximal thigh. 3. Chronic-appearing bony changes of the midfoot which may be posttraumatic, neuropathic, and/or secondary to septic arthritis. 4. Partially imaged complex collection in the pelvis along the right pelvic sidewall, better characterized by same day abdominopelvic CT. 5. Small-moderate size knee joint effusion, nonspecific. 6. Enlarged right inguinal and right external iliac lymph nodes are likely reactive. Electronically Signed   By: Davina Poke D.O.   On: 06/18/2022 12:54   CT Foot Right Wo Contrast  Result Date: 06/18/2022 CLINICAL DATA:  Necrotizing fasciitis EXAM: CT OF THE LOWER RIGHT EXTREMITY WITHOUT CONTRAST TECHNIQUE: Multidetector CT imaging of the right lower extremity was performed according to the standard protocol. Images obtained of the right femur, right tibia-fibula, and right foot. RADIATION DOSE REDUCTION: This exam was performed according to the departmental dose-optimization program which includes automated exposure control, adjustment of the mA and/or kV according to patient size and/or use of iterative reconstruction technique.  COMPARISON:  Foot x-ray 06/18/2022.  Abdominopelvic CT 06/18/2022 FINDINGS: RIGHT FEMUR: Bones/Joint/Cartilage No acute fracture or dislocation. Right hip joint is intact with mild joint space narrowing. No appreciable right hip joint effusion. No intra-articular gas in the right hip joint. No erosion or periosteal elevation. Knee joint alignment is maintained. Small-moderate size knee joint effusion, nonspecific. Ligaments Suboptimally assessed by CT. Muscles and Tendons Numerous foci of gas within the anterior compartment of the right thigh. Gas is predominantly located along the fascial planes where there is extensive fat stranding and trace fluid. There are also foci of gas  located intramuscularly, notably involving the right sartorius and right iliopsoas muscles. Gas present within the sartorius muscle at the level of the distal femoral metaphysis (series 4, image 222). No well-defined intramuscular fluid collection. Grossly intact tendinous structures. Soft tissues Circumferential edema and ill-defined fluid throughout the right thigh. Soft tissue gas within the subcutaneous soft tissues of the right inguinal canal and anteromedial aspect of the proximal thigh. Enlarged right inguinal and right external iliac lymph nodes are likely reactive. Partially imaged complex collection in the pelvis along the right pelvic sidewall, better characterized by same day abdominopelvic CT. RIGHT TIBIA/FIBULA: Bones/Joint/Cartilage Tibia and fibula are intact without fracture or dislocation. No erosion or periosteal elevation. No tibiotalar joint effusion. Ligaments Suboptimally assessed by CT. Muscles and Tendons No intramuscular fluid collection within the lower leg musculature. No fascial or intramuscular gas. Soft tissues Circumferential subcutaneous edema and ill-defined fluid within the soft tissues of the lower leg. No organized fluid collection. No soft tissue gas. RIGHT FOOT: Bones/Joint/Cartilage Chronic-appearing  bony changes of the midfoot which may be posttraumatic, neuropathic, or secondary to septic arthritis with prominent sclerosis and subchondral cysts or erosions and areas of bony fragmentation. Bulky callus formation of the second through fourth metatarsals. No findings of an acute fracture or dislocation. Detailed evaluation is limited on large field-of-view whole lower extremity images. Ligaments Suboptimally assessed by CT. Muscles and Tendons Fatty atrophy of the foot musculature, likely related to denervation. No intramuscular gas. Soft tissues Generalized soft tissue swelling of the foot with fluid slightly more confluent along the medial aspect of the midfoot measuring approximately 3.3 x 1.3 cm (series 4, image 452). No discernible soft tissue wound or ulceration. No soft tissue gas within the foot. IMPRESSION: 1. Numerous foci of gas within the anterior compartment of the right thigh. Gas is predominantly located along the fascial planes where there is extensive fat stranding and trace fluid. There are also foci of gas located within the right sartorius muscle and right iliopsoas musculature. Intramuscular gas within the sartorius muscle is seen tracking inferiorly to the level of the distal femoral metaphysis. Findings are consistent with necrotizing fasciitis and infectious myositis. 2. Findings of diffuse cellulitis of the right lower extremity extending from the proximal thigh to the foot. Gas within the superficial soft tissues is localized to the right inguinal region and anteromedial aspect of the proximal thigh. 3. Chronic-appearing bony changes of the midfoot which may be posttraumatic, neuropathic, and/or secondary to septic arthritis. 4. Partially imaged complex collection in the pelvis along the right pelvic sidewall, better characterized by same day abdominopelvic CT. 5. Small-moderate size knee joint effusion, nonspecific. 6. Enlarged right inguinal and right external iliac lymph nodes are  likely reactive. Electronically Signed   By: Davina Poke D.O.   On: 06/18/2022 12:54   CT ABDOMEN PELVIS WO CONTRAST  Result Date: 06/18/2022 CLINICAL DATA:  41 year old female with history of right lower quadrant abdominal pain. Low hemoglobin. EXAM: CT ABDOMEN AND PELVIS WITHOUT CONTRAST TECHNIQUE: Multidetector CT imaging of the abdomen and pelvis was performed following the standard protocol without IV contrast. RADIATION DOSE REDUCTION: This exam was performed according to the departmental dose-optimization program which includes automated exposure control, adjustment of the mA and/or kV according to patient size and/or use of iterative reconstruction technique. COMPARISON:  CT the abdomen and pelvis 08/27/2021. FINDINGS: Lower chest: Small amount of pericardial fluid and/or thickening, unlikely to be of hemodynamic significance at this time. Hepatobiliary: No suspicious cystic or solid hepatic lesions are confidently  identified on today's noncontrast CT examination. Diffuse low attenuation throughout the hepatic parenchyma, indicative of a background of hepatic steatosis. Unenhanced appearance of the gallbladder is unremarkable. Pancreas: No definite pancreatic mass or peripancreatic fluid collections or inflammatory changes are noted on today's noncontrast CT examination. Spleen: Unremarkable. Adrenals/Urinary Tract: Mild right hydroureteronephrosis. Unenhanced appearance of the kidneys is otherwise unremarkable bilaterally. Right adrenal gland is normal in appearance. 2.8 x 2.1 cm low-attenuation (-7 HU) left adrenal nodule, compatible with a benign adenoma. No left hydroureteronephrosis. Urinary bladder is unremarkable in appearance. 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 No pathologic dilatation of small bowel or colon. Vascular/Lymphatic: No atherosclerotic calcifications are noted in the abdominal aorta. Mild atherosclerotic calcifications are noted in the  pelvic vasculature. No definite lymphadenopathy confidently identified in the abdomen or pelvis. Reproductive: Uterus and ovaries are unremarkable in appearance. Other: Mild diffuse body wall edema. Presacral edema. Trace volume of ascites. No pneumoperitoneum. Musculoskeletal: Along the right pelvic sidewall (axial image 70 of series 3 and coronal image 72 of series 4) there is a mass-like area measuring 6.8 x 6.0 x 7.8 cm which is internally low-attenuation, apparently centered in the right psoas musculature, with extension into the adjacent soft tissues, exerting substantial mass effect, particularly on the distal third of the right ureter. Gas is present in the deep soft tissues of the anterior compartment of the right thigh, tracking cephalad along the right iliacus musculature. There are no aggressive appearing lytic or blastic lesions noted in the visualized portions of the skeleton. IMPRESSION: 1. Findings, as above, highly concerning for necrotizing fasciitis in the right thigh. Further evaluation with noncontrast CT of the right lower extremity should be considered to better evaluate the full extent of these findings. 2. Low-attenuation mass-like lesion along the right pelvic sidewall centered in the inferior aspect of the right psoas muscle, likely a large abscess. This exerts mass effect upon adjacent structures, most notably the distal third of the right ureter, associated with mild proximal right hydroureteronephrosis. 3. No definite spontaneous intraperitoneal or retroperitoneal hemorrhage confidently identified on today's examination to account for the patient's low hematocrit level. 4. Trace volume of ascites. 5. Diffuse body wall edema. 6. Small amount of pericardial fluid and/or thickening, unlikely to be of hemodynamic significance at this time. 7. Hepatic steatosis. 8. Left adrenal adenoma. 9. Additional incidental findings, as above. Critical Value/emergent results were called by telephone at  the time of interpretation on 06/18/2022 at 9:58 am to provider [facility] , who verbally acknowledged these results. Electronically Signed   By: Vinnie Langton M.D.   On: 06/18/2022 10:02   DG Foot Complete Right  Result Date: 06/18/2022 CLINICAL DATA:  Foot swelling EXAM: RIGHT FOOT COMPLETE - 3 VIEW COMPARISON:  03/13/2022. FINDINGS: Disordered appearance of midfoot intertarsal configuration and bony fragmentation suggest Charcot joint changes. No definite acute traumatic abnormality identified. No osteolytic or osteoblastic changes. There is soft tissue swelling of the mid to distal foot. Plantar calcaneal spur noted. A 9 mm radiopaque needle shaped structure identified in the plantar soft tissues which could be a foreign body. IMPRESSION: Midfoot Charcot joint changes. Persistent foreign body in the plantar soft tissues. Electronically Signed   By: Sammie Bench M.D.   On: 06/18/2022 09:29   DG Chest 2 View  Result Date: 06/18/2022 CLINICAL DATA:  fever EXAM: CHEST - 2 VIEW COMPARISON:  03/05/2022 FINDINGS: Cardiac silhouette enlarged. No evidence of pneumothorax or pleural effusion. No evidence of pulmonary edema. There are thoracic  degenerative changes. IMPRESSION: Enlarged cardiac silhouette.  Lungs are clear. Electronically Signed   By: Sammie Bench M.D.   On: 06/18/2022 09:19    Anti-infectives: Anti-infectives (From admission, onward)    Start     Dose/Rate Route Frequency Ordered Stop   06/18/22 1900  [MAR Hold]  linezolid (ZYVOX) IVPB 600 mg        (MAR Hold since Mon 06/20/2022 at 0747.Hold Reason: Transfer to a Procedural area)   600 mg 300 mL/hr over 60 Minutes Intravenous Every 12 hours 06/18/22 1702     06/18/22 1715  [MAR Hold]  meropenem (MERREM) 1 g in sodium chloride 0.9 % 100 mL IVPB        (MAR Hold since Mon 06/20/2022 at 0747.Hold Reason: Transfer to a Procedural area)   1 g 200 mL/hr over 30 Minutes Intravenous Every 12 hours 06/18/22 1709     06/18/22 0745   piperacillin-tazobactam (ZOSYN) IVPB 3.375 g        3.375 g 100 mL/hr over 30 Minutes Intravenous  Once 06/18/22 0733 06/18/22 0835       Assessment/Plan: s/p Procedure(s): IRRIGATION AND DEBRIDEMENT PERIRECTAL ABSCESS (N/A) Plan for dressing change and possible debridement in OR today. Risks and benefits of the surgery discussed with pt and she understands and wishes to proceed. I asked ortho to participate since this is deep in the muscles of pelvis and thigh which is in the area that ortho works but they declined.  LOS: 2 days    Autumn Messing III 06/20/2022

## 2022-06-20 NOTE — Op Note (Signed)
06/20/2022  9:33 AM  PATIENT:  Jacqueline [name]  41 y.o. female  PRE-OPERATIVE DIAGNOSIS:  Necrotizing Infection  POST-OPERATIVE DIAGNOSIS:  Necrotizing Infection  PROCEDURE:  Procedure(s): WOUND EXPLORATION OF RIGHT THIGH AND RIGHT GROIN WITH IRRIGATION AND DEBRIDEMENT  AND DRESSING CHANGE  SURGEON:  Surgeon(s) and Role:    * Jovita Kussmaul, MD - Primary  PHYSICIAN ASSISTANT:   ASSISTANTS: none   ANESTHESIA:   general  EBL:  minimal   BLOOD ADMINISTERED:none  DRAINS: none   LOCAL MEDICATIONS USED:  NONE  SPECIMEN:  No Specimen  DISPOSITION OF SPECIMEN:  N/A  COUNTS:  YES  TOURNIQUET:  * No tourniquets in log *  DICTATION: .Dragon Dictation  After informed consent was obtained the patient was brought to the operating room and placed in the supine position on the operating table.  After adequate induction of general anesthesia the dressings were removed and the packing was removed as well.  The right side and lower abdominal area were prepped with Betadine and draped in usual sterile manner.  An appropriate timeout was performed.  The wounds were explored and appeared to be very clean.  The tissue planes all seem to be intact.  There were no areas where the tissue planes wanted to fall apart and extend any further.  The wounds were then irrigated with copious amounts of saline.  The wounds were then packed with normal saline moistened Kerlix.  Dressings were applied.  The patient tolerated the procedure well.  At the end of the case all needle sponge and instrument counts were correct.  The patient was then awakened and taken to recovery in stable condition.  PLAN OF CARE: Admit to inpatient   PATIENT DISPOSITION:  PACU - hemodynamically stable.   Delay start of Pharmacological VTE agent (>24hrs) due to surgical blood loss or risk of bleeding: no

## 2022-06-20 NOTE — Anesthesia Procedure Notes (Signed)
Procedure Name: LMA Insertion Date/Time: 06/20/2022 9:08 AM  Performed by: Amadeo Garnet, CRNAPre-anesthesia Checklist: Patient identified, Emergency Drugs available, Suction available and Patient being monitored Patient Re-evaluated:Patient Re-evaluated prior to induction Oxygen Delivery Method: Circle system utilized Preoxygenation: Pre-oxygenation with 100% oxygen Induction Type: IV induction LMA: LMA inserted LMA Size: 4.0 Placement Confirmation: positive ETCO2 and breath sounds checked- equal and bilateral Dental Injury: Teeth and Oropharynx as per pre-operative assessment

## 2022-06-20 NOTE — Progress Notes (Signed)
eLink Physician-Brief Progress Note Patient Name: Jacqueline [name] DOB: 25-Feb-1981 MRN: [id number]   Date of Service  06/20/2022  HPI/Events of Note  UOP just 100 in this shift. Cr > 5  Discussed with RN. Vomited once. Not on pressors. On room air.  S/p thigh , pelvis necrotizing fascitis 12/02.  No bleeding. MAP > 89.  NPO, for OR in AM for re debridement.   eICU Interventions  Start  saline at 100 ml/hr. EF normal.follow labs in AM.      Intervention Category Intermediate Interventions: Oliguria - evaluation and management  Elmer Sow 06/20/2022, 1:18 AM

## 2022-06-20 NOTE — Progress Notes (Addendum)
   06/19/22 2300  Unmeasured Output  Emesis Occurrence 1  Emesis Characteristics  Emesis Appearance Green   Patient stated leg pain 8 out of 10, Zofran and dilaudid given

## 2022-06-20 NOTE — Consult Note (Signed)
NAMERye [name], MRN:  [id number], DOB:  1981/01/28, LOS: 2 ADMISSION DATE:  06/18/2022, CONSULTATION DATE: 06/18/2022 REFERRING MD: Dr. Kieth Brightly, CHIEF COMPLAINT: Sepsis  History of Present Illness:  41 year old woman with a history of diabetes, hypertension, obesity with OSA, CKD.  She has a history of a gastric bypass in 2020.  She has experienced 2 days of worsening right groin and right leg pain, ultimately to the point where she could not ambulate and was having difficulty moving her leg.  She was evaluated in the emergency department and by [place].  A CT abdomen and pelvis showed evidence for necrotizing fasciitis in the right thigh with a masslike opacity in the right pelvic sidewall in the psoas musculature. CT of the right lower extremity confirmed gas in the anterior compartment of the right thigh, diffuse cellulitis, chronic appearing bony changes of the midfoot.  She was started on Zosyn and went urgently to the OR for surgical debridement by [facility] and orthopedics.  She underwent debridement of skin, muscle, fascia and abdominal wall and retroperitoneum.  She was able to be extubated and taken to the PACU. In the PACU she is still sleepy but neurologically intact, hemodynamically stable.  Adequate saturations.  She will be admitted to the ICU for further care.  Pertinent  Medical History   Past Medical History:  Diagnosis Date   Anemia    Anxiety    Chronic bronchitis (HCC)    Hypertension    Increased frequency of headaches    Morbid obesity ([place])    Sleep apnea    Type II diabetes mellitus ([place])     Significant Hospital Events: Including procedures, antibiotic start and stop dates in addition to other pertinent events   12/2 surgical debridement right lower extremity, pelvis, retroperitoneal necrotizing fasciitis 12/4 Planned for re-debridement  Interim History / Subjective:  NPO this am for redebridement. No complaints Poor UOP, BUN/Cr slightly rising compared to  yesterday   Objective   Blood pressure 105/68, pulse 80, temperature 97.8 F (36.6 C), temperature source Oral, resp. rate 15, height 5\' 5"  (1.651 m), weight 91.5 kg, SpO2 94 %.        Intake/Output Summary (Last 24 hours) at 06/20/2022 0741 Last data filed at 06/20/2022 0600 Gross per 24 hour  Intake 1617.97 ml  Output 515 ml  Net 1102.97 ml   Filed Weights   06/18/22 1343 06/18/22 1730  Weight: 82.6 kg 91.5 kg   Physical Exam: General: Well-appearing, no acute distress HENT: [place], AT, OP clear, MMM Eyes: EOMI, no scleral icterus Respiratory: Clear to auscultation bilaterally.  No crackles, wheezing or rales Cardiovascular: RRR, -M/R/G, no JVD GI: BS+, soft, nontender Extremities: Right lower extremity/thigh dressed, -Edema,-tenderness Neuro: AAO x4, CNII-XII grossly intact Psych: Normal mood, normal affect GU: Foley in place  Bicarb 15, improved BUN/Cr 74/5.55 slightly worsening WBC 24 unchanged Stable Hg  Wound culture 12/2 - Few GPC, few [facility] Problem list     Assessment & Plan:   Necrotizing fasciitis right lower extremity, debridement 12/2 Sepsis -Continue antibiotics: linezolid, meropenem as ordered -F/u cultures: blood cultures and wound cultures. Wound culture with few GPC, few GNR -Pain control  Acute on chronic renal failure, due to sepsis, ATN Metabolic acidosis Hyperkalemia -S/p sodium bicarbonate infusion ordered for 12 hours, plan to complete -Lokelma given now, follow potassium. Stable -F/u PM BMET, VBG -Follow urine output -Home Lasix and Bumex are on hold -Ensure adequate renal perfusion -Renal dose medications and  renal diet -Nephrology consulted  DM Type II w hyperglycemia -Change hyperglycemia protocol to meals and at bedtime  Hx Hypertension -Home antihypertensive regimen on hold, diuretics on hold  Depression -Continue home sertraline. Of note, patient on zyvox  OK to transfer out of ICU if stable  post-op   Best Practice (right click and "Reselect all SmartList Selections" daily)   Diet/type: Regular consistency (see orders) DVT prophylaxis: prophylactic heparin  GI prophylaxis: PPI Lines: Central line Foley:  Yes, and it is still needed Code Status:  full code Last date of multidisciplinary goals of care discussion pending  Critical care time: NA    Care Time: 50 min  Rodman Pickle, MD 06/20/2022, 7:41 AM [facility] [phone] or if no answer before 7:00PM call [phone] For any issues after 7:00PM please call eLink [phone]

## 2022-06-20 NOTE — Anesthesia Postprocedure Evaluation (Signed)
Anesthesia Post Note  Patient: Media planner  Procedure(s) Performed: WOUND EXPLORATION OF RIGHT THIGH AND RIGHT GROIN WITH IRRIGATION AND DEBRIDEMENT (Right)     Patient location during evaluation: PACU Anesthesia Type: General Level of consciousness: awake and alert Pain management: pain level controlled Vital Signs Assessment: post-procedure vital signs reviewed and stable Respiratory status: spontaneous breathing, nonlabored ventilation, respiratory function stable and patient connected to nasal cannula oxygen Cardiovascular status: blood pressure returned to baseline and stable Postop Assessment: no apparent nausea or vomiting Anesthetic complications: no  No notable events documented.  Last Vitals:  Vitals:   06/20/22 1052 06/20/22 1200  BP: 130/81 131/78  Pulse: 80 85  Resp: 18 18  Temp: 36.4 C   SpO2: 92% 95%    Last Pain:  Vitals:   06/20/22 1200  TempSrc:   PainSc: 7                  Tonie Vizcarrondo L Kenson Groh

## 2022-06-20 NOTE — Consult Note (Signed)
[facility] Renal Consultation Note  Requesting MD: Loanne Drilling  Indication for Consultation: advanced CKD  HPI:  Jacqueline [name] is a 41 y.o. female with DM, HTN, obesity , OSA as well as what appears to be progressive CKD with crt over 4 in the summer of 2023.  She presented with leg pain-  discovered to have necrotizing fasciitis of right thigh and some pelvic issue as well-  she is s/p I and D of all of these areas on 12/2 and also today. During her hospitalization course- her kidney function has actually stayed close to the same-  crt 5.1 to 5.6 without trend-  UOP around 500 ccs per day-  albumin is 1.9.  Patient says that she was feeling quite well a week prior to admission, good appetite and good energy.  She follows with Dr. Carolin Sicks at [facility].  Some preliminary conversations regarding dialysis have been held but no preparations.  Right now, she is at pain and having GI distress but likely this secondary to her recent surgeries  Creatinine, Ser  Date/Time Value Ref Range Status  06/20/2022 04:48 AM 5.55 (H) 0.44 - 1.00 mg/dL Final  06/19/2022 06:07 PM 5.53 (H) 0.44 - 1.00 mg/dL Final  06/19/2022 07:49 AM 5.20 (H) 0.44 - 1.00 mg/dL Final  06/19/2022 12:56 AM 5.33 (H) 0.44 - 1.00 mg/dL Final  06/18/2022 05:55 PM 5.15 (H) 0.44 - 1.00 mg/dL Final  06/18/2022 07:50 AM 5.57 (H) 0.44 - 1.00 mg/dL Final  03/13/2022 05:03 PM 4.10 (H) 0.44 - 1.00 mg/dL Final  03/05/2022 11:08 PM 4.21 (H) 0.44 - 1.00 mg/dL Final  08/27/2021 08:11 AM 3.54 (H) 0.44 - 1.00 mg/dL Final  05/17/2021 08:07 AM 3.24 (H) 0.44 - 1.00 mg/dL Final  08/19/2020 04:29 AM 2.94 (H) 0.44 - 1.00 mg/dL Final  08/18/2020 02:56 AM 2.97 (H) 0.44 - 1.00 mg/dL Final  08/17/2020 04:54 AM 3.14 (H) 0.44 - 1.00 mg/dL Final  12/03/2019 03:45 AM 2.25 (H) 0.44 - 1.00 mg/dL Final  12/02/2019 08:38 AM 2.14 (H) 0.44 - 1.00 mg/dL Final  10/01/2019 08:40 AM 2.18 (H) 0.44 - 1.00 mg/dL Final  07/07/2019 07:34 AM 1.94  (H) 0.44 - 1.00 mg/dL Final  07/05/2019 10:44 AM 2.25 (H) 0.44 - 1.00 mg/dL Final  07/01/2019 08:50 AM 2.08 (H) 0.44 - 1.00 mg/dL Final  06/24/2019 08:30 AM 1.89 (H) 0.44 - 1.00 mg/dL Final  03/15/2019 03:59 PM 1.78 (H) 0.44 - 1.00 mg/dL Final  10/03/2018 10:22 AM 2.09 (H) 0.44 - 1.00 mg/dL Final  09/12/2018 08:45 AM 2.82 (H) 0.44 - 1.00 mg/dL Final  07/06/2018 03:58 AM 2.02 (H) 0.44 - 1.00 mg/dL Final  07/05/2018 04:15 AM 2.18 (H) 0.44 - 1.00 mg/dL Final  07/04/2018 05:07 AM 2.16 (H) 0.44 - 1.00 mg/dL Final  07/03/2018 05:35 AM 2.38 (H) 0.44 - 1.00 mg/dL Final  07/02/2018 08:16 AM 2.05 (H) 0.44 - 1.00 mg/dL Final  07/02/2018 01:56 AM 2.00 (H) 0.44 - 1.00 mg/dL Final  06/29/2018 10:50 PM 2.02 (H) 0.44 - 1.00 mg/dL Final  05/06/2018 02:02 AM 1.79 (H) 0.44 - 1.00 mg/dL Final  05/05/2018 06:32 AM 1.81 (H) 0.44 - 1.00 mg/dL Final  05/04/2018 01:53 PM 1.74 (H) 0.44 - 1.00 mg/dL Final  05/03/2018 02:41 PM 1.82 (H) 0.44 - 1.00 mg/dL Final  02/14/2018 08:33 AM 1.58 (H) 0.44 - 1.00 mg/dL Final  10/01/2017 08:36 PM 1.33 (H) 0.44 - 1.00 mg/dL Final  07/16/2017 02:38 PM 1.35 (H) 0.44 - 1.00 mg/dL Final  06/13/2017 02:40  PM 1.23 (H) 0.44 - 1.00 mg/dL Final  05/23/2017 08:37 PM 1.06 (H) 0.44 - 1.00 mg/dL Final  05/05/2017 04:04 AM 0.99 0.44 - 1.00 mg/dL Final  05/04/2017 03:04 AM 1.13 (H) 0.44 - 1.00 mg/dL Final  05/03/2017 12:35 PM 1.40 (H) 0.44 - 1.00 mg/dL Final  05/02/2017 05:59 AM 0.96 0.44 - 1.00 mg/dL Final  05/01/2017 05:56 AM 0.99 0.44 - 1.00 mg/dL Final  04/30/2017 12:30 PM 0.92 0.44 - 1.00 mg/dL Final  01/31/2017 04:18 PM 1.28 (H) 0.44 - 1.00 mg/dL Final  08/22/2016 11:38 AM 0.90 0.44 - 1.00 mg/dL Final  05/18/2016 01:13 PM 0.91 0.44 - 1.00 mg/dL Final  04/16/2016 06:45 PM 0.73 0.44 - 1.00 mg/dL Final  03/17/2016 02:57 AM 0.64 0.44 - 1.00 mg/dL Final  03/16/2016 04:56 AM 0.61 0.44 - 1.00 mg/dL Final  03/15/2016 04:59 AM 0.66 0.44 - 1.00 mg/dL Final     PMHx:   Past Medical  History:  Diagnosis Date   Anemia    Anxiety    Chronic bronchitis (HCC)    Hypertension    Increased frequency of headaches    Morbid obesity ([place])    Sleep apnea    Type II diabetes mellitus ([place])     Past Surgical History:  Procedure Laterality Date   BARIATRIC SURGERY     BREAST REDUCTION SURGERY  03/21/2017   CARDIAC CATHETERIZATION  01/06/2016   CARDIAC CATHETERIZATION N/A 01/06/2016   Procedure: Left Heart Cath and Coronary Angiography;  Surgeon: Wellington Hampshire, MD;  Location: [facility];  Service: Cardiovascular;  Laterality: N/A;   CESAREAN SECTION  08/2014   I & D EXTREMITY Right 06/18/2022   Procedure: IRRIGATION AND DEBRIDEMENT RIGHT ABDOMEN AND THIGH;  Surgeon: Kinsinger, Arta Bruce, MD;  Location: [facility];  Service: General;  Laterality: Right;   REDUCTION MAMMAPLASTY Bilateral 03/21/2017    Family Hx:  Family History  Problem Relation Age of Onset   Diabetes Mother    Hypertension Mother    Thyroid disease Mother    Kidney disease Maternal Grandmother    Diabetes Maternal Grandmother    Heart attack Other     Social History:  reports that she has never smoked. She has never used smokeless tobacco. She reports that she does not drink alcohol and does not use drugs.  Allergies: No Known Allergies  Medications: Prior to Admission medications   Medication Sig Start Date End Date Taking? Authorizing Provider  amLODipine (NORVASC) 10 MG tablet Take 1 tablet (10 mg total) by mouth daily. 07/08/19  Yes Nita Sells, MD  bumetanide (BUMEX) 1 MG tablet Take 1 mg by mouth every morning. 01/12/21  Yes [provider]  calcitRIOL (ROCALTROL) 0.25 MCG capsule Take 0.25 mcg by mouth daily.   Yes [provider]  carvedilol (COREG) 25 MG tablet Take 1 tablet (25 mg total) by mouth 2 (two) times daily with a meal. 05/06/18  Yes Lady Deutscher, MD  diclofenac Sodium (VOLTAREN) 1 % GEL Apply 2 g topically 4 (four) times daily as needed  (pain).    Yes [provider]  folic acid (FOLVITE) 1 MG tablet Take 1 mg by mouth daily.   Yes [provider]  furosemide (LASIX) 40 MG tablet Take 40 mg by mouth daily as needed for fluid or edema. 06/23/19  Yes [provider]  hydrALAZINE (APRESOLINE) 10 MG tablet Take 10 mg by mouth daily.   Yes [provider]  methocarbamol (ROBAXIN) 500 MG tablet Take  500 mg by mouth every 6 (six) hours as needed for muscle spasms.   Yes [provider]  sertraline (ZOLOFT) 100 MG tablet Take 100 mg by mouth at bedtime. 04/30/19  Yes [provider]  Vitamin D, Ergocalciferol, (DRISDOL) 1.25 MG (50000 UNIT) CAPS capsule Take 50,000 Units by mouth every Thursday. 12/23/19  Yes [provider]  gentamicin cream (GARAMYCIN) 0.1 % Apply 1 Application topically 2 (two) times daily. Patient not taking: Reported on 06/18/2022 03/22/22   Edrick Kins, DPM    I have reviewed the patient's current medications.  Labs:  Results for orders placed or performed during the hospital encounter of 06/18/22 (from the past 48 hour(s))  Aerobic/Anaerobic Culture w Gram Stain (surgical/deep wound)     Status: None (Preliminary result)   Collection Time: 06/18/22  3:04 PM   Specimen: PATH Other; Body Fluid  Result Value Ref Range   Specimen Description FLUID RIGHT THIGH    Special Requests PT ON ANCEF    Gram Stain      RARE WBC PRESENT, PREDOMINANTLY PMN FEW GRAM POSITIVE COCCI FEW GRAM NEGATIVE RODS Performed at [facility], [place] [address], [place], [place] [address]    Culture      MODERATE STREPTOCOCCUS ANGINOSIS SUSCEPTIBILITIES TO FOLLOW MIXED ANAEROBIC FLORA PRESENT.  CALL LAB IF FURTHER IID REQUIRED.    Report Status PENDING   Glucose, capillary     Status: Abnormal   Collection Time: 06/18/22  5:37 PM  Result Value Ref Range   Glucose-Capillary 110 (H) 70 - 99 mg/dL    Comment: Glucose reference range applies only to samples taken after  fasting for at least 8 hours.  Lactic acid, plasma     Status: Abnormal   Collection Time: 06/18/22  5:55 PM  Result Value Ref Range   Lactic Acid, Venous 2.4 (HH) 0.5 - 1.9 mmol/L    Comment: CRITICAL RESULT CALLED TO, READ BACK BY AND VERIFIED WITH K,HEPLER RN @2014  06/18/22 E,BENTON Performed at [facility], [place] [address], [place], [place] [address]   Basic metabolic panel     Status: Abnormal   Collection Time: 06/18/22  5:55 PM  Result Value Ref Range   Sodium 140 135 - 145 mmol/L   Potassium 5.1 3.5 - 5.1 mmol/L   Chloride 113 (H) 98 - 111 mmol/L   CO2 12 (L) 22 - 32 mmol/L   Glucose, Bld 110 (H) 70 - 99 mg/dL    Comment: Glucose reference range applies only to samples taken after fasting for at least 8 hours.   BUN 63 (H) 6 - 20 mg/dL   Creatinine, Ser 5.15 (H) 0.44 - 1.00 mg/dL   Calcium 7.4 (L) 8.9 - 10.3 mg/dL   GFR, Estimated 10 (L) >60 mL/min    Comment: (NOTE) Calculated using the CKD-EPI Creatinine Equation (2021)    Anion gap 15 5 - 15    Comment: Performed at [facility] [address] [place], [place] [address]  Hemoglobin and hematocrit, blood     Status: Abnormal   Collection Time: 06/18/22  5:55 PM  Result Value Ref Range   Hemoglobin 10.9 (L) 12.0 - 15.0 g/dL    Comment: REPEATED TO VERIFY POST TRANSFUSION SPECIMEN    HCT 32.2 (L) 36.0 - 46.0 %    Comment: Performed at [facility] [address], [place], [place] [address]  MRSA Next Gen by PCR, Nasal     Status: None  Collection Time: 06/18/22  5:55 PM   Specimen: Nasal Mucosa; Nasal Swab  Result Value Ref Range   MRSA by PCR Next Gen NOT DETECTED NOT DETECTED    Comment: (NOTE) The GeneXpert MRSA Assay (FDA approved for NASAL specimens only), is one component of a comprehensive MRSA colonization surveillance program. It is not intended to diagnose MRSA infection nor to guide or monitor treatment for MRSA infections. Test performance is not FDA approved in patients less than  38 years old. Performed at [facility], [place] [address], [place], [place] [address]   Glucose, capillary     Status: Abnormal   Collection Time: 06/18/22  8:41 PM  Result Value Ref Range   Glucose-Capillary 138 (H) 70 - 99 mg/dL    Comment: Glucose reference range applies only to samples taken after fasting for at least 8 hours.  Glucose, capillary     Status: Abnormal   Collection Time: 06/19/22 12:10 AM  Result Value Ref Range   Glucose-Capillary 132 (H) 70 - 99 mg/dL    Comment: Glucose reference range applies only to samples taken after fasting for at least 8 hours.  Comprehensive metabolic panel     Status: Abnormal   Collection Time: 06/19/22 12:56 AM  Result Value Ref Range   Sodium 139 135 - 145 mmol/L   Potassium 4.5 3.5 - 5.1 mmol/L   Chloride 112 (H) 98 - 111 mmol/L   CO2 14 (L) 22 - 32 mmol/L   Glucose, Bld 125 (H) 70 - 99 mg/dL    Comment: Glucose reference range applies only to samples taken after fasting for at least 8 hours.   BUN 64 (H) 6 - 20 mg/dL   Creatinine, Ser 5.33 (H) 0.44 - 1.00 mg/dL   Calcium 7.2 (L) 8.9 - 10.3 mg/dL   Total Protein 5.7 (L) 6.5 - 8.1 g/dL   Albumin 1.9 (L) 3.5 - 5.0 g/dL   AST 39 15 - 41 U/L   ALT 24 0 - 44 U/L   Alkaline Phosphatase 65 38 - 126 U/L   Total Bilirubin 1.6 (H) 0.3 - 1.2 mg/dL   GFR, Estimated 10 (L) >60 mL/min    Comment: (NOTE) Calculated using the CKD-EPI Creatinine Equation (2021)    Anion gap 13 5 - 15    Comment: Performed at [facility], [place] [address] [place], [place] [address]  CBC     Status: Abnormal   Collection Time: 06/19/22 12:56 AM  Result Value Ref Range   WBC 24.0 (H) 4.0 - 10.5 K/uL   RBC 3.40 (L) 3.87 - 5.11 MIL/uL   Hemoglobin 10.4 (L) 12.0 - 15.0 g/dL   HCT 30.0 (L) 36.0 - 46.0 %   MCV 88.2 80.0 - 100.0 fL   MCH 30.6 26.0 - 34.0 pg   MCHC 34.7 30.0 - 36.0 g/dL   RDW 14.9 11.5 - 15.5 %   Platelets 255 150 - 400 K/uL   nRBC 0.1 0.0 - 0.2 %    Comment: Performed at [facility], [place] [address], [place], [place] [address]  Magnesium     Status: None   Collection Time: 06/19/22 12:56 AM  Result Value Ref Range   Magnesium 1.8 1.7 - 2.4 mg/dL    Comment: Performed at [facility] [address] [place], [place] [address]  Phosphorus     Status: Abnormal   Collection Time: 06/19/22 12:56 AM  Result Value Ref Range  Phosphorus 6.4 (H) 2.5 - 4.6 mg/dL    Comment: Performed at [facility], [place] [address] [place], [place] [address]  Glucose, capillary     Status: Abnormal   Collection Time: 06/19/22  4:39 AM  Result Value Ref Range   Glucose-Capillary 69 (L) 70 - 99 mg/dL    Comment: Glucose reference range applies only to samples taken after fasting for at least 8 hours.  Glucose, capillary     Status: None   Collection Time: 06/19/22  4:40 AM  Result Value Ref Range   Glucose-Capillary 75 70 - 99 mg/dL    Comment: Glucose reference range applies only to samples taken after fasting for at least 8 hours.  Glucose, capillary     Status: Abnormal   Collection Time: 06/19/22  5:22 AM  Result Value Ref Range   Glucose-Capillary 110 (H) 70 - 99 mg/dL    Comment: Glucose reference range applies only to samples taken after fasting for at least 8 hours.  Basic metabolic panel     Status: Abnormal   Collection Time: 06/19/22  7:49 AM  Result Value Ref Range   Sodium 135 135 - 145 mmol/L   Potassium 5.8 (H) 3.5 - 5.1 mmol/L   Chloride 110 98 - 111 mmol/L   CO2 13 (L) 22 - 32 mmol/L   Glucose, Bld 126 (H) 70 - 99 mg/dL    Comment: Glucose reference range applies only to samples taken after fasting for at least 8 hours.   BUN 65 (H) 6 - 20 mg/dL   Creatinine, Ser 5.20 (H) 0.44 - 1.00 mg/dL   Calcium 6.9 (L) 8.9 - 10.3 mg/dL   GFR, Estimated 10 (L) >60 mL/min    Comment: (NOTE) Calculated using the CKD-EPI Creatinine Equation (2021)    Anion gap 12 5 - 15    Comment: Performed at [facility] [address], [place], [place] [address]   Glucose, capillary     Status: Abnormal   Collection Time: 06/19/22  8:33 AM  Result Value Ref Range   Glucose-Capillary 118 (H) 70 - 99 mg/dL    Comment: Glucose reference range applies only to samples taken after fasting for at least 8 hours.  Glucose, capillary     Status: Abnormal   Collection Time: 06/19/22 12:51 PM  Result Value Ref Range   Glucose-Capillary 132 (H) 70 - 99 mg/dL    Comment: Glucose reference range applies only to samples taken after fasting for at least 8 hours.  Glucose, capillary     Status: Abnormal   Collection Time: 06/19/22  4:15 PM  Result Value Ref Range   Glucose-Capillary 154 (H) 70 - 99 mg/dL    Comment: Glucose reference range applies only to samples taken after fasting for at least 8 hours.  Basic metabolic panel     Status: Abnormal   Collection Time: 06/19/22  6:07 PM  Result Value Ref Range   Sodium 138 135 - 145 mmol/L   Potassium 4.4 3.5 - 5.1 mmol/L   Chloride 108 98 - 111 mmol/L   CO2 14 (L) 22 - 32 mmol/L   Glucose, Bld 109 (H) 70 - 99 mg/dL    Comment: Glucose reference range applies only to samples taken after fasting for at least 8 hours.   BUN 68 (H) 6 - 20 mg/dL   Creatinine, Ser 5.53 (H) 0.44 - 1.00 mg/dL   Calcium 7.1 (L) 8.9 - 10.3 mg/dL   GFR, Estimated  9 (L) >60 mL/min    Comment: (NOTE) Calculated using the CKD-EPI Creatinine Equation (2021)    Anion gap 16 (H) 5 - 15    Comment: Performed at [facility], [place] [address] [place], [place] [address]  Glucose, capillary     Status: Abnormal   Collection Time: 06/19/22  9:31 PM  Result Value Ref Range   Glucose-Capillary 215 (H) 70 - 99 mg/dL    Comment: Glucose reference range applies only to samples taken after fasting for at least 8 hours.  Basic metabolic panel     Status: Abnormal   Collection Time: 06/20/22  4:48 AM  Result Value Ref Range   Sodium 134 (L) 135 - 145 mmol/L   Potassium 4.3 3.5 - 5.1 mmol/L   Chloride 107 98 - 111 mmol/L   CO2 15 (L) 22 - 32  mmol/L   Glucose, Bld 108 (H) 70 - 99 mg/dL    Comment: Glucose reference range applies only to samples taken after fasting for at least 8 hours.   BUN 74 (H) 6 - 20 mg/dL   Creatinine, Ser 5.55 (H) 0.44 - 1.00 mg/dL   Calcium 6.5 (L) 8.9 - 10.3 mg/dL   GFR, Estimated 9 (L) >60 mL/min    Comment: (NOTE) Calculated using the CKD-EPI Creatinine Equation (2021)    Anion gap 12 5 - 15    Comment: Performed at [facility] [address] [place], [place] [address]  CBC     Status: Abnormal   Collection Time: 06/20/22  4:48 AM  Result Value Ref Range   WBC 24.8 (H) 4.0 - 10.5 K/uL   RBC 3.09 (L) 3.87 - 5.11 MIL/uL   Hemoglobin 9.2 (L) 12.0 - 15.0 g/dL   HCT 27.6 (L) 36.0 - 46.0 %   MCV 89.3 80.0 - 100.0 fL   MCH 29.8 26.0 - 34.0 pg   MCHC 33.3 30.0 - 36.0 g/dL   RDW 15.4 11.5 - 15.5 %   Platelets 219 150 - 400 K/uL   nRBC 0.2 0.0 - 0.2 %    Comment: Performed at [facility], [place] [address] [place], [place] [address]  Magnesium     Status: None   Collection Time: 06/20/22  4:48 AM  Result Value Ref Range   Magnesium 1.8 1.7 - 2.4 mg/dL    Comment: Performed at [facility] [address], [place],  [address]  Phosphorus     Status: Abnormal   Collection Time: 06/20/22  4:48 AM  Result Value Ref Range   Phosphorus 6.2 (H) 2.5 - 4.6 mg/dL    Comment: Performed at [facility] [address] [place], [place] [address]  Glucose, capillary     Status: Abnormal   Collection Time: 06/20/22  8:01 AM  Result Value Ref Range   Glucose-Capillary 124 (H) 70 - 99 mg/dL    Comment: Glucose reference range applies only to samples taken after fasting for at least 8 hours.  Glucose, capillary     Status: Abnormal   Collection Time: 06/20/22  9:47 AM  Result Value Ref Range   Glucose-Capillary 115 (H) 70 - 99 mg/dL    Comment: Glucose reference range applies only to samples taken after fasting for at least 8 hours.  Glucose, capillary     Status: Abnormal    Collection Time: 06/20/22 11:24 AM  Result Value Ref Range   Glucose-Capillary 125 (H) 70 - 99 mg/dL  Comment: Glucose reference range applies only to samples taken after fasting for at least 8 hours.     ROS:  A comprehensive review of systems was negative except for: Cardiovascular: positive for lower extremity edema Gastrointestinal: positive for abdominal pain and nausea  Physical Exam: Vitals:   06/20/22 1300 06/20/22 1400  BP: 113/74 111/69  Pulse: 94 86  Resp: 19 16  Temp:    SpO2: 96% 96%     General: Well appearing, obese black female in bed in no acute distress HEENT: Pupils are equal round reactive to light, extraocular motions are intact, mucous membranes are moist Neck: Difficult to discern JVD Heart: Slightly tachycardic Lungs: Clear to auscultation anteriorly Abdomen: Obese, tender Extremities: Minimal edema on left lower extremity -right lower extremity postop with edema Skin: Warm and dry Neuro: Alert and nonfocal  Assessment/Plan: 41 year old black female with progressive CKD as outpatient followed by [facility] with baseline creatinine in the fours.  She now presents with necrotizing fasciitis requiring incision and debridement x2.  She has had some worsening of renal function in this setting 1.Renal-baseline CKD that is been progressive as outpatient.  Presumably secondary to diabetes and hypertension.  She has had a slight decrease in her GFR in the setting of necrotizing fasciitis and debridements.  Is going to be really difficult to tell if she is having symptoms related to uremia or related to her current medical situation.  She reports feeling really well a week prior to admission.  There are no absolute acute indications for dialysis at this time.  What we need to do is just follow her kidney function as well as her clinical improvement trajectory.  If she does not improve we could possibly say that uremia is the culprit.  In that case, initiation of  dialysis might be warranted.  Patient seems to understand the concepts 2. Hypertension/volume  -normally on antihypertensives as outpatient.  Right now blood pressure is fine on no meds.  Volume status seems may be just a little heavy but close to euvolemic.  No recommendations in that department 3.  Necrotizing fasciitis-status post debridements x2, per surgery-also on antibiotics 4.  Metabolic acidosis-start serum bicarbonate 5. Anemia  -due to CKD and surgery-we will give ESA but hold off on iron right now 6.  Bones-check PTH and Phos and act accordingly   Louis Meckel 06/20/2022, 3:02 PM

## 2022-06-20 NOTE — Transfer of Care (Signed)
Immediate Anesthesia Transfer of Care Note  Patient: Jacqueline [name]  Procedure(s) Performed: WOUND EXPLORATION OF RIGHT THIGH AND RIGHT GROIN WITH IRRIGATION AND DEBRIDEMENT (Right)  Patient Location: PACU  Anesthesia Type:General  Level of Consciousness: awake, alert , and oriented  Airway & Oxygen Therapy: Patient Spontanous Breathing  Post-op Assessment: Report given to RN, Post -op Vital signs reviewed and stable, and Patient moving all extremities  Post vital signs: Reviewed and stable  Last Vitals:  Vitals Value Taken Time  BP 97/63 06/20/22 0945  Temp 36.4 C 06/20/22 0941  Pulse 80 06/20/22 0947  Resp 22 06/20/22 0947  SpO2 92 % 06/20/22 0947  Vitals shown include unvalidated device data.  Last Pain:  Vitals:   06/20/22 0941  TempSrc:   PainSc: 8       Patients Stated Pain Goal: 2 (03/49/61 1643)  Complications: No notable events documented.

## 2022-06-21 ENCOUNTER — Encounter (HOSPITAL_COMMUNITY): Payer: Self-pay | Admitting: General Surgery

## 2022-06-21 DIAGNOSIS — M729 Fibroblastic disorder, unspecified: Secondary | ICD-10-CM | POA: Diagnosis not present

## 2022-06-21 DIAGNOSIS — N170 Acute kidney failure with tubular necrosis: Secondary | ICD-10-CM | POA: Diagnosis not present

## 2022-06-21 DIAGNOSIS — N183 Chronic kidney disease, stage 3 unspecified: Secondary | ICD-10-CM

## 2022-06-21 DIAGNOSIS — A419 Sepsis, unspecified organism: Secondary | ICD-10-CM | POA: Diagnosis not present

## 2022-06-21 LAB — TYPE AND SCREEN
ABO/RH(D): B POS
Antibody Screen: NEGATIVE

## 2022-06-21 LAB — GLUCOSE, CAPILLARY
Glucose-Capillary: 232 mg/dL — ABNORMAL HIGH (ref 70–99)
Glucose-Capillary: 166 mg/dL — ABNORMAL HIGH (ref 70–99)
Glucose-Capillary: 160 mg/dL — ABNORMAL HIGH (ref 70–99)
Glucose-Capillary: 167 mg/dL — ABNORMAL HIGH (ref 70–99)

## 2022-06-21 LAB — RENAL FUNCTION PANEL
Albumin: 1.6 g/dL — ABNORMAL LOW (ref 3.5–5.0)
Anion gap: 12 (ref 5–15)
BUN: 79 mg/dL — ABNORMAL HIGH (ref 6–20)
CO2: 16 mmol/L — ABNORMAL LOW (ref 22–32)
Calcium: 7.3 mg/dL — ABNORMAL LOW (ref 8.9–10.3)
Chloride: 106 mmol/L (ref 98–111)
Creatinine, Ser: 6.03 mg/dL — ABNORMAL HIGH (ref 0.44–1.00)
GFR, Estimated: 8 mL/min — ABNORMAL LOW (ref >60)
Glucose, Bld: 199 mg/dL — ABNORMAL HIGH (ref 70–99)
Phosphorus: 7.2 mg/dL — ABNORMAL HIGH (ref 2.5–4.6)
Potassium: 4.4 mmol/L (ref 3.5–5.1)
Sodium: 134 mmol/L — ABNORMAL LOW (ref 135–145)

## 2022-06-21 LAB — AEROBIC/ANAEROBIC CULTURE W GRAM STAIN (SURGICAL/DEEP WOUND)

## 2022-06-21 LAB — PHOSPHORUS: Phosphorus: 7.1 mg/dL — ABNORMAL HIGH (ref 2.5–4.6)

## 2022-06-21 LAB — CBC
HCT: 32.6 % — ABNORMAL LOW (ref 36.0–46.0)
Hemoglobin: 11.3 g/dL — ABNORMAL LOW (ref 12.0–15.0)
MCH: 30.5 pg (ref 26.0–34.0)
MCHC: 34.7 g/dL (ref 30.0–36.0)
MCV: 88.1 fL (ref 80.0–100.0)
Platelets: 241 10*3/uL (ref 150–400)
RBC: 3.7 MIL/uL — ABNORMAL LOW (ref 3.87–5.11)
RDW: 15.4 % (ref 11.5–15.5)
WBC: 35.4 10*3/uL — ABNORMAL HIGH (ref 4.0–10.5)
nRBC: 0.1 % (ref 0.0–0.2)

## 2022-06-21 LAB — MAGNESIUM: Magnesium: 2 mg/dL (ref 1.7–2.4)

## 2022-06-21 MED ORDER — HYDROMORPHONE HCL 1 MG/ML IJ SOLN
1.0000 mg | INTRAMUSCULAR | Status: DC | PRN
  Administered 2022-06-21: 1 mg via INTRAVENOUS
  Filled 2022-06-21: qty 1

## 2022-06-21 MED ORDER — CEFAZOLIN SODIUM-DEXTROSE 2-4 GM/100ML-% IV SOLN: 2.0000 g | INTRAVENOUS | Status: DC

## 2022-06-21 MED ORDER — POVIDONE-IODINE 10 % EX SWAB: 2.0000 | Freq: Once | CUTANEOUS | Status: DC

## 2022-06-21 MED ORDER — SODIUM CHLORIDE 0.9 % IV SOLN
3.0000 g | Freq: Two times a day (BID) | INTRAVENOUS | Status: DC
  Administered 2022-06-21 – 2022-06-22 (×2): 3 g via INTRAVENOUS
  Filled 2022-06-21 (×2): qty 8

## 2022-06-21 MED ORDER — CHLORHEXIDINE GLUCONATE 4 % EX LIQD
60.0000 mL | Freq: Once | CUTANEOUS | Status: DC
  Filled 2022-06-21: qty 60

## 2022-06-21 MED ORDER — HYDROMORPHONE HCL 1 MG/ML IJ SOLN
0.5000 mg | Freq: Once | INTRAMUSCULAR | Status: AC
  Administered 2022-06-21: 0.5 mg via INTRAVENOUS
  Filled 2022-06-21: qty 0.5

## 2022-06-21 MED ORDER — PHENOL 1.4 % MT LIQD
1.0000 | OROMUCOSAL | Status: DC | PRN
  Filled 2022-06-21: qty 177

## 2022-06-21 NOTE — Progress Notes (Signed)
Pharmacy Antibiotic Note  Jacqueline [name] is a 41 y.o. female admitted on 06/18/2022 with  necrotizing fasciitis .  Pharmacy has been consulted for Unasyn dosing.  Plan: Continue linezolid through 12/05 for anti-toxin effect Change merrem to unasyn 3 grams IV every 12 hours Monitor clinical progress, cultures/sensitivities, renal function, abx plan ID is consulting/following F/u SCr for need to adjust dosing  Height: 5\' 5"  (165.1 cm) Weight: 91.5 kg (201 lb 11.5 oz) IBW/kg (Calculated) : 57  Temp (24hrs), Avg:97.9 F (36.6 C), Min:97.4 F (36.3 C), Max:98.6 F (37 C)  Recent Labs  Lab 06/18/22 0750 06/18/22 0754 06/18/22 1755 06/19/22 0056 06/19/22 0749 06/19/22 1807 06/20/22 0448 06/20/22 1725 06/21/22 0531 06/21/22 0532  WBC 27.8*  --   --  24.0*  --   --  24.8*  --  35.4*  --   CREATININE 5.57*  --  5.15* 5.33* 5.20* 5.53* 5.55* 5.86*  --  6.03*  LATICACIDVEN  --  1.9 2.4*  --   --   --   --   --   --   --     Estimated Creatinine Clearance: 13.6 mL/min (A) (by C-G formula based on SCr of 6.03 mg/dL (H)).    No Known Allergies  Antimicrobials this admission: 12/2 Merrem >>12/5 12/2 Linezolid >>12/5 12/5 Unasyn >>  Dose adjustments this admission:   Microbiology results: 12/2 R thigh fluid -  strep anginosis-  CTX, levaquin, PCN, Vanc;  R- erythromycin And mixed anaerobes 12/2 BCx: ng x 3d  12/2 MRSA negative   Thank you for allowing [place] to participate in this patients care. Jens Som, PharmD 06/21/2022 2:38 PM  **Pharmacist phone directory can be found on [email or web address] listed under [place]**

## 2022-06-21 NOTE — Progress Notes (Signed)
  Transition of Care [facility]) Screening Note   Patient Details  Name: Jacqueline [name] Date of Birth: 1980-09-27   Transition of Care [facility]) CM/SW Contact:    Benard Halsted, LCSW Phone Number: 06/21/2022, 5:15 PM    Transition of Care Department [facility]) has reviewed patient. We will continue to monitor patient advancement through interdisciplinary progression rounds. If new patient transition needs arise, please place a TOC consult.

## 2022-06-21 NOTE — Progress Notes (Signed)
1 Day Post-Op  Subjective: No new complaints.  Still with some numbness in her leg and foot, but able to move it.  No other complaints.  Pain is improving    Objective: Vital signs in last 24 hours: Temp:  [97.4 F (36.3 C)-98.6 F (37 C)] 97.5 F (36.4 C) (12/05 1157) Pulse Rate:  [77-84] 84 (12/05 1157) Resp:  [15-20] 16 (12/05 1157) BP: (118-153)/(78-90) 143/87 (12/05 1157) SpO2:  [97 %-99 %] 99 % (12/05 1157) Last BM Date : 06/19/22  Intake/Output from previous day: 12/04 0701 - 12/05 0700 In: 400 [I.V.:400] Out: -  Intake/Output this shift: No intake/output data recorded.  PE: Ext: groin and right thigh wounds are clean with minimal necrotic tissue only in the superior most aspect of her right thigh wound that is about 5% of her wound max.  See photos below of groin and thigh wound      Lab Results:  Recent Labs    06/20/22 0448 06/21/22 0531  WBC 24.8* 35.4*  HGB 9.2* 11.3*  HCT 27.6* 32.6*  PLT 219 241   BMET Recent Labs    06/20/22 1725 06/21/22 0532  NA 134* 134*  K 4.8 4.4  CL 107 106  CO2 12* 16*  GLUCOSE 179* 199*  BUN 76* 79*  CREATININE 5.86* 6.03*  CALCIUM 6.8* 7.3*   PT/INR No results for input(s): "LABPROT", "INR" in the last 72 hours. CMP     Component Value Date/Time   NA 134 (L) 06/21/2022 0532   K 4.4 06/21/2022 0532   CL 106 06/21/2022 0532   CO2 16 (L) 06/21/2022 0532   GLUCOSE 199 (H) 06/21/2022 0532   BUN 79 (H) 06/21/2022 0532   CREATININE 6.03 (H) 06/21/2022 0532   CALCIUM 7.3 (L) 06/21/2022 0532   PROT 5.7 (L) 06/19/2022 0056   ALBUMIN 1.6 (L) 06/21/2022 0532   AST 39 06/19/2022 0056   ALT 24 06/19/2022 0056   ALKPHOS 65 06/19/2022 0056   BILITOT 1.6 (H) 06/19/2022 0056   GFRNONAA 8 (L) 06/21/2022 0532   GFRAA 31 (L) 12/03/2019 0345   Lipase     Component Value Date/Time   LIPASE 40 08/27/2021 0811       Studies/Results: No results found.  Anti-infectives: Anti-infectives (From admission,  onward)    Start     Dose/Rate Route Frequency Ordered Stop   06/18/22 1900  linezolid (ZYVOX) IVPB 600 mg        600 mg 300 mL/hr over 60 Minutes Intravenous Every 12 hours 06/18/22 1702     06/18/22 1715  meropenem (MERREM) 1 g in sodium chloride 0.9 % 100 mL IVPB  Status:  Discontinued        1 g 200 mL/hr over 30 Minutes Intravenous Every 12 hours 06/18/22 1709 06/21/22 1411   06/18/22 0745  piperacillin-tazobactam (ZOSYN) IVPB 3.375 g        3.375 g 100 mL/hr over 30 Minutes Intravenous  Once 06/18/22 0733 06/18/22 0835        Assessment/Plan POD 3/1, s/p excisional debridement of NSTI of right groin/thigh, Dr. Toy Cookey, Dr. Mable Fill (ortho) -cont abx therapy -source appears well controlled at this time -do not anticipate a need to return to the OR at this point -begin NS WD dressing changes to wound BID tomorrow -tolerated very well -PT eval due to neurologic complaints in the right leg to assist with mobilization  FEN - renal carb mod diet VTE - heparin ID - zyvox  A  on CKD HTN DM Obesity OSA  I reviewed hospitalist notes, last 24 h vitals and pain scores, last 48 h intake and output, last 24 h labs and trends, and last 24 h imaging results.   LOS: 3 days    Henreitta Cea , [facility] 06/21/2022, 2:15 PM Please see Amion for pager number during day hours 7:00am-4:30pm or 7:00am -11:30am on weekends

## 2022-06-21 NOTE — Progress Notes (Signed)
eLink Physician-Brief Progress Note Patient Name: Jacqueline [name] DOB: Jul 21, 1980 MRN: [id number]   Date of Service  06/21/2022  HPI/Events of Note  Patient with post op pain. Not due to have Oxycodone till 2 am and is asking for a dilaudid dose since it worked better. No other complaints. Was getting IV dilaudid earlier   eICU Interventions  Dilaudid x 1 ordered      Intervention Category Intermediate Interventions: Pain - evaluation and management  Jacqueline [name] 06/21/2022, 1:05 AM

## 2022-06-21 NOTE — Progress Notes (Signed)
PROGRESS NOTE    Jacqueline [name]  ERX:[id number] DOB: 10/10/1980 DOA: 06/18/2022 PCP: Audley Hose, MD   Chief Complaint  Patient presents with   Flu-Like Symptoms   Fever    Brief Narrative:   41 year old woman with a history of diabetes, hypertension, obesity with OSA, CKD.  She has a history of a gastric bypass in 2020.  She has experienced 2 days of worsening right groin and right leg pain, ultimately to the point where she could not ambulate and was having difficulty moving her leg.  She was evaluated in the emergency department and by [place].  A CT abdomen and pelvis showed evidence for necrotizing fasciitis in the right thigh with a masslike opacity in the right pelvic sidewall in the psoas musculature. CT of the right lower extremity confirmed gas in the anterior compartment of the right thigh, diffuse cellulitis, chronic appearing bony changes of the midfoot.  She was started on Zosyn and went urgently to the OR for surgical debridement by [facility] and orthopedics.  She underwent debridement of skin, muscle, fascia and abdominal wall and retroperitoneum.  She was able to be extubated and taken to the PACU. In the PACU she is still sleepy but neurologically intact, hemodynamically stable.  Adequate saturations.  She will be admitted to the ICU for further care.   Assessment & Plan:   Principal Problem:   Fasciitis Active Problems:   Sepsis ([place])   Acute renal failure superimposed on stage 3 chronic kidney disease (HCC)   Diabetes ([place])   Type II diabetes mellitus with renal manifestations ([place])   Reviewed sepsis due to necrotizing fasciitis of right lower extremity  -Status post debridement 12/2, and 12/4 by general surgery . -Lower extremity debridement by Dr. Sharol Given 12/6  -Continue with IV linezolid and meropenem, patient continued to have worsening leukocytosis despite appropriate antibiotic coverage, ID has been consulted.   -Operative cultures showing Streptococcus  anginosus -Pain control   Acute on chronic renal failure, due to sepsis, ATN Metabolic acidosis Hyperkalemia -S/p sodium bicarbonate infusion  -Required [place] -Management per renal   DM Type II w hyperglycemia -Patient does not appear to be on any home medication, will keep an insulin sliding scale, will check A1c.   Hx Hypertension -Home medications remain on hold, initial blood pressure is soft, currently acceptable, continue to monitor off meds.   Depression -Continue home sertraline. Of note, patient on zyvox    DVT prophylaxis: Heparin Code Status: Full Family Communication: mother and husband at bedside Disposition:   Status is: Inpatient    Consultants:  Renal PCCM  General surgery Orthopedic ID   Subjective:  Reports some nausea, no vomiting, reports improving right abdomen/lower extremity pain Objective: Vitals:   06/21/22 0000 06/21/22 0400 06/21/22 0736 06/21/22 1157  BP: 129/82 138/89 (!) 153/90 (!) 143/87  Pulse: 83 77 77 84  Resp: 15 18 19 16   Temp: 98.2 F (36.8 C) 98.6 F (37 C) (!) 97.4 F (36.3 C) (!) 97.5 F (36.4 C)  TempSrc: Oral Oral Oral Oral  SpO2: 97% 98% 98% 99%  Weight:      Height:       No intake or output data in the 24 hours ending 06/21/22 1401 Filed Weights   06/18/22 1343 06/18/22 1730  Weight: 82.6 kg 91.5 kg    Examination:  Awake Alert, Oriented X 3, No new F.N deficits, Normal affect Symmetrical Chest wall movement, Good air movement bilaterally, CTAB RRR,No Gallops,Rubs or new Murmurs, No Parasternal  Heave +ve B.Sounds, Abd Soft, lower abdomen bandaged/right upper thigh bandaged. No Cyanosis, Clubbing or edema, No new Rash or bruise      Data Reviewed: I have personally reviewed following labs and imaging studies  CBC: Recent Labs  Lab 06/18/22 0750 06/18/22 1755 06/19/22 0056 06/20/22 0448 06/21/22 0531  WBC 27.8*  --  24.0* 24.8* 35.4*  NEUTROABS 26.7*  --   --   --   --   HGB 5.9* 10.9*  10.4* 9.2* 11.3*  HCT 18.7* 32.2* 30.0* 27.6* 32.6*  MCV 91.7  --  88.2 89.3 88.1  PLT 353  --  255 219 466    Basic Metabolic Panel: Recent Labs  Lab 06/19/22 0056 06/19/22 0749 06/19/22 1807 06/20/22 0448 06/20/22 1725 06/21/22 0531 06/21/22 0532  NA 139 135 138 134* 134*  --  134*  K 4.5 5.8* 4.4 4.3 4.8  --  4.4  CL 112* 110 108 107 107  --  106  CO2 14* 13* 14* 15* 12*  --  16*  GLUCOSE 125* 126* 109* 108* 179*  --  199*  BUN 64* 65* 68* 74* 76*  --  79*  CREATININE 5.33* 5.20* 5.53* 5.55* 5.86*  --  6.03*  CALCIUM 7.2* 6.9* 7.1* 6.5* 6.8*  --  7.3*  MG 1.8  --   --  1.8  --  2.0  --   PHOS 6.4*  --   --  6.2*  --  7.1* 7.2*    GFR: Estimated Creatinine Clearance: 13.6 mL/min (A) (by C-G formula based on SCr of 6.03 mg/dL (H)).  Liver Function Tests: Recent Labs  Lab 06/18/22 0750 06/19/22 0056 06/21/22 0532  AST 23 39  --   ALT 21 24  --   ALKPHOS 71 65  --   BILITOT 0.7 1.6*  --   PROT 6.0* 5.7*  --   ALBUMIN 1.7* 1.9* 1.6*    CBG: Recent Labs  Lab 06/20/22 1124 06/20/22 1707 06/20/22 2110 06/21/22 0803 06/21/22 1156  GLUCAP 125* 161* 174* 232* 166*     Recent Results (from the past 240 hour(s))  Resp Panel by RT-PCR (Flu A&B, Covid) Anterior Nasal Swab     Status: None   Collection Time: 06/18/22  3:31 AM   Specimen: Anterior Nasal Swab  Result Value Ref Range Status   SARS Coronavirus 2 by RT PCR NEGATIVE NEGATIVE Final    Comment: (NOTE) SARS-CoV-2 target nucleic acids are NOT DETECTED.  The SARS-CoV-2 RNA is generally detectable in upper respiratory specimens during the acute phase of infection. The lowest concentration of SARS-CoV-2 viral copies this assay can detect is 138 copies/mL. A negative result does not preclude SARS-Cov-2 infection and should not be used as the sole basis for treatment or other patient management decisions. A negative result may occur with  improper specimen collection/handling, submission of specimen  other than nasopharyngeal swab, presence of viral mutation(s) within the areas targeted by this assay, and inadequate number of viral copies(<138 copies/mL). A negative result must be combined with clinical observations, patient history, and epidemiological information. The expected result is Negative.  Fact Sheet for Patients:  EntrepreneurPulse.com.au  Fact Sheet for Healthcare Providers:  IncredibleEmployment.be  This test is no t yet approved or cleared by the [place] FDA and  has been authorized for detection and/or diagnosis of SARS-CoV-2 by FDA under an Emergency Use Authorization (EUA). This EUA will remain  in effect (meaning this test can be used) for the duration of  the COVID-19 declaration under Section 564(b)(1) of the Act, 21 U.S.C.section 360bbb-3(b)(1), unless the authorization is terminated  or revoked sooner.       Influenza A by PCR NEGATIVE NEGATIVE Final   Influenza B by PCR NEGATIVE NEGATIVE Final    Comment: (NOTE) The Xpert Xpress SARS-CoV-2/FLU/RSV plus assay is intended as an aid in the diagnosis of influenza from Nasopharyngeal swab specimens and should not be used as a sole basis for treatment. Nasal washings and aspirates are unacceptable for Xpert Xpress SARS-CoV-2/FLU/RSV testing.  Fact Sheet for Patients: EntrepreneurPulse.com.au  Fact Sheet for Healthcare Providers: IncredibleEmployment.be  This test is not yet approved or cleared by the [place] FDA and has been authorized for detection and/or diagnosis of SARS-CoV-2 by FDA under an Emergency Use Authorization (EUA). This EUA will remain in effect (meaning this test can be used) for the duration of the COVID-19 declaration under Section 564(b)(1) of the Act, 21 U.S.C. section 360bbb-3(b)(1), unless the authorization is terminated or revoked.  Performed at [facility], [place] [address] [place],  [place] [address]   Culture, blood (routine x 2)     Status: None (Preliminary result)   Collection Time: 06/18/22  7:30 AM   Specimen: BLOOD  Result Value Ref Range Status   Specimen Description BLOOD SITE NOT SPECIFIED  Final   Special Requests   Final    BOTTLES DRAWN AEROBIC AND ANAEROBIC Blood Culture results may not be optimal due to an inadequate volume of blood received in culture bottles   Culture   Final    NO GROWTH 3 DAYS Performed at [facility], [place] [address] [place], [place] [address]    Report Status PENDING  Incomplete  Culture, blood (routine x 2)     Status: None (Preliminary result)   Collection Time: 06/18/22  7:51 AM   Specimen: BLOOD  Result Value Ref Range Status   Specimen Description BLOOD SITE NOT SPECIFIED  Final   Special Requests   Final    BOTTLES DRAWN AEROBIC AND ANAEROBIC Blood Culture adequate volume   Culture   Final    NO GROWTH 3 DAYS Performed at [facility], [address], [place], [place] [address]    Report Status PENDING  Incomplete  Aerobic/Anaerobic Culture w Gram Stain (surgical/deep wound)     Status: None   Collection Time: 06/18/22  3:04 PM   Specimen: PATH Other; Body Fluid  Result Value Ref Range Status   Specimen Description FLUID RIGHT THIGH  Final   Special Requests PT ON ANCEF  Final   Gram Stain   Final    RARE WBC PRESENT, PREDOMINANTLY PMN FEW GRAM POSITIVE COCCI FEW GRAM NEGATIVE RODS Performed at [facility], [place] [address] [place], [place] [address]    Culture   Final    MODERATE STREPTOCOCCUS ANGINOSIS MIXED ANAEROBIC FLORA PRESENT.  CALL LAB IF FURTHER IID REQUIRED.    Report Status 06/21/2022 FINAL  Final   Organism ID, Bacteria STREPTOCOCCUS ANGINOSIS  Final      Susceptibility   Streptococcus anginosis - MIC*    PENICILLIN <=0.06 SENSITIVE Sensitive     CEFTRIAXONE <=0.12 SENSITIVE Sensitive     ERYTHROMYCIN 4 RESISTANT Resistant     LEVOFLOXACIN 0.5 SENSITIVE Sensitive     VANCOMYCIN  0.5 SENSITIVE Sensitive     * MODERATE STREPTOCOCCUS ANGINOSIS  MRSA Next Gen by PCR, Nasal     Status: None   Collection Time: 06/18/22  5:55 PM   Specimen: Nasal Mucosa; Nasal Swab  Result Value Ref Range Status   MRSA by PCR Next Gen NOT DETECTED NOT DETECTED Final    Comment: (NOTE) The GeneXpert MRSA Assay (FDA approved for NASAL specimens only), is one component of a comprehensive MRSA colonization surveillance program. It is not intended to diagnose MRSA infection nor to guide or monitor treatment for MRSA infections. Test performance is not FDA approved in patients less than 42 years old. Performed at [facility], [place] [address], [place], [place] [address]          Radiology Studies: No results found.      Scheduled Meds:  Chlorhexidine Gluconate Cloth  6 each Topical Daily   darbepoetin (ARANESP) injection - NON-DIALYSIS  100 mcg Subcutaneous Q Mon-1800   heparin injection (subcutaneous)  5,000 Units Subcutaneous Q8H   insulin aspart  0-15 Units Subcutaneous TID WC   insulin aspart  0-5 Units Subcutaneous QHS   pantoprazole (PROTONIX) IV  40 mg Intravenous QHS   sertraline  100 mg Oral QHS   sodium bicarbonate  1,300 mg Oral BID   Continuous Infusions:  linezolid (ZYVOX) IV 600 mg (06/21/22 1914)   meropenem (MERREM) IV 1 g (06/21/22 0634)     LOS: 3 days       Phillips Climes, MD [facility]   To contact the attending provider between 7A-7P or the covering provider during after hours 7P-7A, please log into the web site [email or web address] and access using universal [place] password for that web site. If you do not have the password, please call the hospital operator.  06/21/2022, 2:01 PM

## 2022-06-21 NOTE — Consult Note (Signed)
[name]ious [facility]    Date of Admission:  06/18/2022     Total days of antibiotics 3  Merrem   Linezolid               Reason for Consult: necrotizing SSTI     Referring Provider: Elgergawy Primary Care Provider: Audley Hose, MD    Assessment: Jacqueline [name] is a 41 y.o. female admitted for management of severe necrotizing skin and soft infection involving her right thigh and groin. She has had multiple surgeries for debridement and another planned tomorrow for her right anterior thigh with Dr. Sharol Given. Intraoperative cultures growing streptococcus anginosis and mixed anaerobes. She continues on linezolid and merrem for medical treatment. Would do one more day of toxin coverage with linezolid and can switch to unasyn now given culture report finalized.   Leukocytosis - continues to be high in setting of severe necrotizing infection. Would suspect this will improve after surgical control has been achieved. No other concern for other explanation on exam.     Plan: Continue linezolid through 12/05 Change merrem to unasyn  Follow for op findings tomorrow Further recs pending surgical control.     Hospital Problem List:  -Necrotizing Fasciitis Rt thigh / groin -T2DM, uncontrolled -acute on chronic renal disease, stage 4    Chlorhexidine Gluconate Cloth  6 each Topical Daily   darbepoetin (ARANESP) injection - NON-DIALYSIS  100 mcg Subcutaneous Q Mon-1800   heparin injection (subcutaneous)  5,000 Units Subcutaneous Q8H   insulin aspart  0-15 Units Subcutaneous TID WC   insulin aspart  0-5 Units Subcutaneous QHS   pantoprazole (PROTONIX) IV  40 mg Intravenous QHS   sertraline  100 mg Oral QHS   sodium bicarbonate  1,300 mg Oral BID    HPI: Jacqueline [name] is a 40 y.o. female admitted for management for fever and abdominal pain.  She presented via EMS from home.  Describes pain to the right groin/abdomen with inability to move that distal leg. Has  had previous foot infections involving the right foot in August of this year. Baseline, she is insensate to the right foot but acutely prior to admit she had ascending pain/swelling and numbness. In the ER the right groin progressed to involve crepitance with severe elevated WBC concern for necrotizing skin infection. CT scan 12/02 highly concerning for nec fasciitis to the Rt groin; large psoas muscle abscess concern along right pelvic side wall; anterior compartment of right thigh and intramuscular gas c/w infectious myositis/nec fasc. She received zosyn in the ER and taken to surgery with gen surgery and ortho team.  Switched to linezolid + merrem per CCM   Nephrology has been consulted with acutely worsening CKD stage IV over the summer and worsening during current illness. Cr staying around 5.   Last surgery has been performed 12/04 with Dr. Marlou Starks and another surgery for right thigh planned tomorrow with Dr. Sharol Given. She was transferred out of the ICU to 5W today. WBC continues to be elevated 35k.   She has good result with recovery of function and feeling aside from some numbness that has continued on the anterior thigh.    Microbiology results: 12/2 R thigh fluid -  strep anginosis mixed anaerobes  12/2 BCx: ngtd  12/2 MRSA negative   Review of Systems: Review of Systems  Constitutional:  Negative for chills, fever, malaise/fatigue and weight loss.  HENT:  Negative for sore throat.  No dental problems  Respiratory:  Negative for cough and sputum production.   Cardiovascular:  Negative for chest pain and leg swelling.  Gastrointestinal:  Negative for abdominal pain, diarrhea and vomiting.  Genitourinary:  Negative for dysuria and flank pain.  Musculoskeletal:  Negative for joint pain, myalgias and neck pain.  Skin:  Negative for rash.  Neurological:  Negative for dizziness, tingling and headaches.  Psychiatric/Behavioral:  Negative for depression and substance abuse. The patient  is not nervous/anxious and does not have insomnia.     Past Medical History:  Diagnosis Date   Anemia    Anxiety    Chronic bronchitis (HCC)    Hypertension    Increased frequency of headaches    Morbid obesity ([place])    Sleep apnea    Type II diabetes mellitus (HCC)     Social History   Tobacco Use   Smoking status: Never   Smokeless tobacco: Never  Vaping Use   Vaping Use: Never used  Substance Use Topics   Alcohol use: No   Drug use: No    Family History  Problem Relation Age of Onset   Diabetes Mother    Hypertension Mother    Thyroid disease Mother    Kidney disease Maternal Grandmother    Diabetes Maternal Grandmother    Heart attack Other    No Known Allergies  OBJECTIVE: Blood pressure (!) 143/87, pulse 84, temperature (!) 97.5 F (36.4 C), temperature source Oral, resp. rate 16, height 5\' 5"  (1.651 m), weight 91.5 kg, last menstrual period 06/15/2022, SpO2 99 %.   Physical Exam Vitals reviewed.  Constitutional:      Appearance: Normal appearance. She is not ill-appearing.  HENT:     Mouth/Throat:     Mouth: Mucous membranes are moist.     Pharynx: Oropharynx is clear.  Eyes:     General: No scleral icterus. Cardiovascular:     Rate and Rhythm: Normal rate and regular rhythm.  Pulmonary:     Effort: Pulmonary effort is normal.  Neurological:     Mental Status: She is oriented to person, place, and time.  Psychiatric:        Mood and Affect: Mood normal.        Thought Content: Thought content normal.     Lab Results Lab Results  Component Value Date   WBC 35.4 (H) 06/21/2022   HGB 11.3 (L) 06/21/2022   HCT 32.6 (L) 06/21/2022   MCV 88.1 06/21/2022   PLT 241 06/21/2022    Lab Results  Component Value Date   CREATININE 6.03 (H) 06/21/2022   BUN 79 (H) 06/21/2022   NA 134 (L) 06/21/2022   K 4.4 06/21/2022   CL 106 06/21/2022   CO2 16 (L) 06/21/2022    Lab Results  Component Value Date   ALT 24 06/19/2022   AST 39 06/19/2022    ALKPHOS 65 06/19/2022   BILITOT 1.6 (H) 06/19/2022     Microbiology: Recent Results (from the past 240 hour(s))  Resp Panel by RT-PCR (Flu A&B, Covid) Anterior Nasal Swab     Status: None   Collection Time: 06/18/22  3:31 AM   Specimen: Anterior Nasal Swab  Result Value Ref Range Status   SARS Coronavirus 2 by RT PCR NEGATIVE NEGATIVE Final    Comment: (NOTE) SARS-CoV-2 target nucleic acids are NOT DETECTED.  The SARS-CoV-2 RNA is generally detectable in upper respiratory specimens during the acute phase of infection. The lowest concentration of SARS-CoV-2 viral copies  this assay can detect is 138 copies/mL. A negative result does not preclude SARS-Cov-2 infection and should not be used as the sole basis for treatment or other patient management decisions. A negative result may occur with  improper specimen collection/handling, submission of specimen other than nasopharyngeal swab, presence of viral mutation(s) within the areas targeted by this assay, and inadequate number of viral copies(<138 copies/mL). A negative result must be combined with clinical observations, patient history, and epidemiological information. The expected result is Negative.  Fact Sheet for Patients:  EntrepreneurPulse.com.au  Fact Sheet for Healthcare Providers:  IncredibleEmployment.be  This test is no t yet approved or cleared by the [place] FDA and  has been authorized for detection and/or diagnosis of SARS-CoV-2 by FDA under an Emergency Use Authorization (EUA). This EUA will remain  in effect (meaning this test can be used) for the duration of the COVID-19 declaration under Section 564(b)(1) of the Act, 21 U.S.C.section 360bbb-3(b)(1), unless the authorization is terminated  or revoked sooner.       Influenza A by PCR NEGATIVE NEGATIVE Final   Influenza B by PCR NEGATIVE NEGATIVE Final    Comment: (NOTE) The Xpert Xpress SARS-CoV-2/FLU/RSV plus assay  is intended as an aid in the diagnosis of influenza from Nasopharyngeal swab specimens and should not be used as a sole basis for treatment. Nasal washings and aspirates are unacceptable for Xpert Xpress SARS-CoV-2/FLU/RSV testing.  Fact Sheet for Patients: EntrepreneurPulse.com.au  Fact Sheet for Healthcare Providers: IncredibleEmployment.be  This test is not yet approved or cleared by the [place] FDA and has been authorized for detection and/or diagnosis of SARS-CoV-2 by FDA under an Emergency Use Authorization (EUA). This EUA will remain in effect (meaning this test can be used) for the duration of the COVID-19 declaration under Section 564(b)(1) of the Act, 21 U.S.C. section 360bbb-3(b)(1), unless the authorization is terminated or revoked.  Performed at [facility], [place] [address] [place], [place] [address]   Culture, blood (routine x 2)     Status: None (Preliminary result)   Collection Time: 06/18/22  7:30 AM   Specimen: BLOOD  Result Value Ref Range Status   Specimen Description BLOOD SITE NOT SPECIFIED  Final   Special Requests   Final    BOTTLES DRAWN AEROBIC AND ANAEROBIC Blood Culture results may not be optimal due to an inadequate volume of blood received in culture bottles   Culture   Final    NO GROWTH 3 DAYS Performed at [facility], [place] [address], [place], [place] [address]    Report Status PENDING  Incomplete  Culture, blood (routine x 2)     Status: None (Preliminary result)   Collection Time: 06/18/22  7:51 AM   Specimen: BLOOD  Result Value Ref Range Status   Specimen Description BLOOD SITE NOT SPECIFIED  Final   Special Requests   Final    BOTTLES DRAWN AEROBIC AND ANAEROBIC Blood Culture adequate volume   Culture   Final    NO GROWTH 3 DAYS Performed at [facility], [address] [place], [place] [address]    Report Status PENDING  Incomplete  Aerobic/Anaerobic Culture w Gram Stain  (surgical/deep wound)     Status: None   Collection Time: 06/18/22  3:04 PM   Specimen: PATH Other; Body Fluid  Result Value Ref Range Status   Specimen Description FLUID RIGHT THIGH  Final   Special Requests PT ON ANCEF  Final   Gram Stain  Final    RARE WBC PRESENT, PREDOMINANTLY PMN FEW GRAM POSITIVE COCCI FEW GRAM NEGATIVE RODS Performed at [facility], [place] [address], [place], [place] [address]    Culture   Final    MODERATE STREPTOCOCCUS ANGINOSIS MIXED ANAEROBIC FLORA PRESENT.  CALL LAB IF FURTHER IID REQUIRED.    Report Status 06/21/2022 FINAL  Final   Organism ID, Bacteria STREPTOCOCCUS ANGINOSIS  Final      Susceptibility   Streptococcus anginosis - MIC*    PENICILLIN <=0.06 SENSITIVE Sensitive     CEFTRIAXONE <=0.12 SENSITIVE Sensitive     ERYTHROMYCIN 4 RESISTANT Resistant     LEVOFLOXACIN 0.5 SENSITIVE Sensitive     VANCOMYCIN 0.5 SENSITIVE Sensitive     * MODERATE STREPTOCOCCUS ANGINOSIS  MRSA Next Gen by PCR, Nasal     Status: None   Collection Time: 06/18/22  5:55 PM   Specimen: Nasal Mucosa; Nasal Swab  Result Value Ref Range Status   MRSA by PCR Next Gen NOT DETECTED NOT DETECTED Final    Comment: (NOTE) The GeneXpert MRSA Assay (FDA approved for NASAL specimens only), is one component of a comprehensive MRSA colonization surveillance program. It is not intended to diagnose MRSA infection nor to guide or monitor treatment for MRSA infections. Test performance is not FDA approved in patients less than 56 years old. Performed at [facility], [place] [address], [place], [place] [address]      Janene Madeira, MSN, NP-C [facility] [email or web address] Pager: [phone] Office: [phone] [facility] Main Line: [place] Communication Welcome

## 2022-06-21 NOTE — Progress Notes (Signed)
Subjective:  no UOP recorded but does have some in foley -  crt a little worse-  pt with pain and nausea but for now think it just due to post op state  Objective Vital signs in last 24 hours: Vitals:   06/21/22 0000 06/21/22 0400 06/21/22 0736 06/21/22 1157  BP: 129/82 138/89 (!) 153/90 (!) 143/87  Pulse: 83 77 77 84  Resp: 15 18 19 16   Temp: 98.2 F (36.8 C) 98.6 F (37 C) (!) 97.4 F (36.3 C) (!) 97.5 F (36.4 C)  TempSrc: Oral Oral Oral Oral  SpO2: 97% 98% 98% 99%  Weight:      Height:       Weight change:  No intake or output data in the 24 hours ending 06/21/22 1343   Assessment/Plan: 41 year old black female with progressive CKD as outpatient followed by [facility] with baseline creatinine in the fours.  She now presents with necrotizing fasciitis requiring incision and debridement x2.  She has had some worsening of renal function in this setting 1.Renal-baseline CKD that is been progressive as outpatient.  Presumably secondary to diabetes and hypertension.  She has had a slight decrease in her GFR in the setting of necrotizing fasciitis and debridements.  Is going to be really difficult to tell if she is having symptoms related to uremia or related to her current medical situation.  She reports feeling really well a week prior to admission.  There are no absolute acute indications for dialysis at this time.  What we need to do is just follow her kidney function as well as her clinical improvement trajectory.  If she does not improve we could possibly say that uremia is the culprit.  In that case, initiation of dialysis might be warranted.  Patient seems to understand the concepts 2. Hypertension/volume  -normally on antihypertensives as outpatient.  Right now blood pressure is fine on no meds.  Volume status seems may be just a little heavy but close to euvolemic.  No recommendations in that department 3.  Necrotizing fasciitis-status post debridements x2, per surgery-also on  antibiotics 4.  Metabolic acidosis-start serum bicarbonate 5. Anemia  -due to CKD and surgery-we will give ESA but hold off on iron right now 6.  Bones-PTH pending -  Phos high-  keep in mind but wont start binders right now due to GI issues     [place]: Basic Metabolic Panel: Recent Labs  Lab 06/20/22 0448 06/20/22 1725 06/21/22 0531 06/21/22 0532  NA 134* 134*  --  134*  K 4.3 4.8  --  4.4  CL 107 107  --  106  CO2 15* 12*  --  16*  GLUCOSE 108* 179*  --  199*  BUN 74* 76*  --  79*  CREATININE 5.55* 5.86*  --  6.03*  CALCIUM 6.5* 6.8*  --  7.3*  PHOS 6.2*  --  7.1* 7.2*   Liver Function Tests: Recent Labs  Lab 06/18/22 0750 06/19/22 0056 06/21/22 0532  AST 23 39  --   ALT 21 24  --   ALKPHOS 71 65  --   BILITOT 0.7 1.6*  --   PROT 6.0* 5.7*  --   ALBUMIN 1.7* 1.9* 1.6*   No results for input(s): "LIPASE", "AMYLASE" in the last 168 hours. No results for input(s): "AMMONIA" in the last 168 hours. CBC: Recent Labs  Lab 06/18/22 0750 06/18/22 1755 06/19/22 0056 06/20/22 0448 06/21/22 0531  WBC 27.8*  --  24.0* 24.8* 35.4*  NEUTROABS 26.7*  --   --   --   --   HGB 5.9*   < > 10.4* 9.2* 11.3*  HCT 18.7*   < > 30.0* 27.6* 32.6*  MCV 91.7  --  88.2 89.3 88.1  PLT 353  --  255 219 241   < > = values in this interval not displayed.   Cardiac Enzymes: No results for input(s): "CKTOTAL", "CKMB", "CKMBINDEX", "TROPONINI" in the last 168 hours. CBG: Recent Labs  Lab 06/20/22 1124 06/20/22 1707 06/20/22 2110 06/21/22 0803 06/21/22 1156  GLUCAP 125* 161* 174* 232* 166*    Iron Studies: No results for input(s): "IRON", "TIBC", "TRANSFERRIN", "FERRITIN" in the last 72 hours. Studies/Results: No results found. Medications: Infusions:  linezolid (ZYVOX) IV 600 mg (06/21/22 9528)   meropenem (MERREM) IV 1 g (06/21/22 4132)    Scheduled Medications:  Chlorhexidine Gluconate Cloth  6 each Topical Daily   darbepoetin (ARANESP)  injection - NON-DIALYSIS  100 mcg Subcutaneous Q Mon-1800   heparin injection (subcutaneous)  5,000 Units Subcutaneous Q8H   insulin aspart  0-15 Units Subcutaneous TID WC   insulin aspart  0-5 Units Subcutaneous QHS   pantoprazole (PROTONIX) IV  40 mg Intravenous QHS   sertraline  100 mg Oral QHS   sodium bicarbonate  1,300 mg Oral BID    have reviewed scheduled and prn medications.  Physical Exam: General: alert, NAD Heart: RRR Lungs: mostly clear Abdomen: obese, post op Extremities: trace edema    06/21/2022,1:43 PM  LOS: 3 days

## 2022-06-22 ENCOUNTER — Encounter (HOSPITAL_COMMUNITY)
Admission: EM | Disposition: A | Discharge status: Other Acute Inpatient Hospital | Payer: Self-pay | Source: Home / Self Care | Attending: Internal Medicine

## 2022-06-22 DIAGNOSIS — E1122 Type 2 diabetes mellitus with diabetic chronic kidney disease: Secondary | ICD-10-CM

## 2022-06-22 DIAGNOSIS — M729 Fibroblastic disorder, unspecified: Secondary | ICD-10-CM | POA: Diagnosis not present

## 2022-06-22 DIAGNOSIS — N1832 Chronic kidney disease, stage 3b: Secondary | ICD-10-CM

## 2022-06-22 DIAGNOSIS — N170 Acute kidney failure with tubular necrosis: Secondary | ICD-10-CM | POA: Diagnosis not present

## 2022-06-22 DIAGNOSIS — Z794 Long term (current) use of insulin: Secondary | ICD-10-CM

## 2022-06-22 DIAGNOSIS — A419 Sepsis, unspecified organism: Secondary | ICD-10-CM | POA: Diagnosis not present

## 2022-06-22 LAB — BPAM RBC
Blood Product Expiration Date: 202312242359
Blood Product Expiration Date: 202312242359
Blood Product Expiration Date: 202312272359
Blood Product Expiration Date: 202312272359
Blood Product Expiration Date: 202312272359
Blood Product Expiration Date: 202312272359
ISSUE DATE / TIME: 202312021241
ISSUE DATE / TIME: 202312021448
ISSUE DATE / TIME: 202312021530
ISSUE DATE / TIME: 202312021530
ISSUE DATE / TIME: 202312022229
Unit Type and Rh: 5100
Unit Type and Rh: 5100
Unit Type and Rh: 7300
Unit Type and Rh: 7300
Unit Type and Rh: 7300
Unit Type and Rh: 7300

## 2022-06-22 LAB — TYPE AND SCREEN
ABO/RH(D): B POS
Antibody Screen: NEGATIVE
Unit division: 0
Unit division: 0
Unit division: 0
Unit division: 0
Unit division: 0
Unit division: 0

## 2022-06-22 LAB — RENAL FUNCTION PANEL
Albumin: <1.5 g/dL — ABNORMAL LOW (ref 3.5–5.0)
Anion gap: 13 (ref 5–15)
BUN: 87 mg/dL — ABNORMAL HIGH (ref 6–20)
CO2: 15 mmol/L — ABNORMAL LOW (ref 22–32)
Calcium: 6.9 mg/dL — ABNORMAL LOW (ref 8.9–10.3)
Chloride: 104 mmol/L (ref 98–111)
Creatinine, Ser: 6.62 mg/dL — ABNORMAL HIGH (ref 0.44–1.00)
GFR, Estimated: 7 mL/min — ABNORMAL LOW (ref >60)
Glucose, Bld: 143 mg/dL — ABNORMAL HIGH (ref 70–99)
Phosphorus: 6.6 mg/dL — ABNORMAL HIGH (ref 2.5–4.6)
Potassium: 3.8 mmol/L (ref 3.5–5.1)
Sodium: 132 mmol/L — ABNORMAL LOW (ref 135–145)

## 2022-06-22 LAB — GLUCOSE, CAPILLARY
Glucose-Capillary: 163 mg/dL — ABNORMAL HIGH (ref 70–99)
Glucose-Capillary: 120 mg/dL — ABNORMAL HIGH (ref 70–99)
Glucose-Capillary: 149 mg/dL — ABNORMAL HIGH (ref 70–99)
Glucose-Capillary: 75 mg/dL (ref 70–99)

## 2022-06-22 LAB — CBC
HCT: 32 % — ABNORMAL LOW (ref 36.0–46.0)
Hemoglobin: 10.9 g/dL — ABNORMAL LOW (ref 12.0–15.0)
MCH: 30.2 pg (ref 26.0–34.0)
MCHC: 34.1 g/dL (ref 30.0–36.0)
MCV: 88.6 fL (ref 80.0–100.0)
Platelets: 198 10*3/uL (ref 150–400)
RBC: 3.61 MIL/uL — ABNORMAL LOW (ref 3.87–5.11)
RDW: 15 % (ref 11.5–15.5)
WBC: 34.7 10*3/uL — ABNORMAL HIGH (ref 4.0–10.5)
nRBC: 0.1 % (ref 0.0–0.2)

## 2022-06-22 LAB — PARATHYROID HORMONE, INTACT (NO CA): PTH: 415 pg/mL — ABNORMAL HIGH (ref 15–65)

## 2022-06-22 LAB — HEMOGLOBIN A1C
Hgb A1c MFr Bld: 5.4 % (ref 4.8–5.6)
Mean Plasma Glucose: 108 mg/dL

## 2022-06-22 SURGERY — IRRIGATION AND DEBRIDEMENT EXTREMITY
Anesthesia: Choice | Laterality: Right

## 2022-06-22 MED ORDER — HYDROMORPHONE HCL 1 MG/ML IJ SOLN
0.5000 mg | INTRAMUSCULAR | Status: DC | PRN
  Administered 2022-06-22 – 2022-06-24 (×11): 1 mg via INTRAVENOUS
  Administered 2022-06-24: 0.5 mg via INTRAVENOUS
  Administered 2022-06-25 – 2022-06-27 (×15): 1 mg via INTRAVENOUS
  Filled 2022-06-22 (×10): qty 1
  Filled 2022-06-22: qty 0.5
  Filled 2022-06-22 (×16): qty 1

## 2022-06-22 MED ORDER — PROSOURCE PLUS PO LIQD
30.0000 mL | Freq: Two times a day (BID) | ORAL | Status: DC
  Administered 2022-06-23 – 2022-06-29 (×8): 30 mL via ORAL
  Filled 2022-06-22 (×10): qty 30

## 2022-06-22 MED ORDER — MORPHINE SULFATE (PF) 2 MG/ML IV SOLN
2.0000 mg | INTRAVENOUS | Status: DC | PRN
  Administered 2022-06-22: 2 mg via INTRAVENOUS
  Filled 2022-06-22: qty 1

## 2022-06-22 MED ORDER — AMOXICILLIN-POT CLAVULANATE 500-125 MG PO TABS
1.0000 | ORAL_TABLET | Freq: Every day | ORAL | Status: AC
  Administered 2022-06-22 – 2022-06-28 (×7): 1 via ORAL
  Filled 2022-06-22 (×7): qty 1

## 2022-06-22 MED ORDER — ENSURE ENLIVE PO LIQD
237.0000 mL | Freq: Two times a day (BID) | ORAL | Status: DC
  Administered 2022-06-24 – 2022-06-29 (×11): 237 mL via ORAL

## 2022-06-22 MED ORDER — ACETAMINOPHEN 500 MG PO TABS
1000.0000 mg | ORAL_TABLET | Freq: Four times a day (QID) | ORAL | Status: DC
  Administered 2022-06-22 – 2022-06-24 (×8): 1000 mg via ORAL
  Filled 2022-06-22 (×8): qty 2

## 2022-06-22 NOTE — Progress Notes (Signed)
Subjective:  no UOP recorded but does have some in foley -  crt a little worse-  pt with pain and nausea but for now think it just due to post op state-  says she feels a lot better than she did 2 days ago   Objective Vital signs in last 24 hours: Vitals:   06/21/22 2000 06/22/22 0000 06/22/22 0400 06/22/22 0800  BP: (!) 146/86 (!) 148/80 133/86   Pulse: 88 86 83   Resp: 19 16 18    Temp: 98.1 F (36.7 C) 97.8 F (36.6 C) 98.3 F (36.8 C) 98 F (36.7 C)  TempSrc: Oral Oral Oral Oral  SpO2: 97% 99% 98%   Weight:      Height:       Weight change:  No intake or output data in the 24 hours ending 06/22/22 1212   Assessment/Plan: 41 year old black female with progressive CKD as outpatient followed by [facility] with baseline creatinine in the fours.  She now presents with necrotizing fasciitis requiring incision and debridement x2.  She has had some worsening of renal function in this setting 1.Renal-baseline CKD that is been progressive as outpatient.  Presumably secondary to diabetes and hypertension.  She has had a  decrease in her GFR in the setting of necrotizing fasciitis and debridements.  Is going to be really difficult to tell if she is having symptoms related to uremia or related to her current medical situation.  She reports feeling really well a week prior to admission.  There are no absolute acute indications for dialysis at this time.  What we need to do is just follow her kidney function as well as her clinical improvement trajectory.  If she does not improve we could possibly say that uremia is the culprit.  In that case, initiation of dialysis might be warranted.  Patient seems to understand the concepts-  so far is OK  2. Hypertension/volume  -normally on antihypertensives as outpatient.  Right now blood pressure is fine on no meds.  Volume status seems may be just a little heavy but close to euvolemic.  No recommendations in that department 3.  Necrotizing fasciitis-status  post debridements x2, per surgery-also on antibiotics 4.  Metabolic acidosis-  oral serum bicarbonate 5. Anemia  -due to CKD and surgery-we will give ESA but hold off on iron right now 6.  Bones-PTH pending -  Phos high-  keep in mind but wont start binders right now due to GI issues     Louis Meckel    Labs: Basic Metabolic Panel: Recent Labs  Lab 06/20/22 1725 06/21/22 0531 06/21/22 0532 06/22/22 0415  NA 134*  --  134* 132*  K 4.8  --  4.4 3.8  CL 107  --  106 104  CO2 12*  --  16* 15*  GLUCOSE 179*  --  199* 143*  BUN 76*  --  79* 87*  CREATININE 5.86*  --  6.03* 6.62*  CALCIUM 6.8*  --  7.3* 6.9*  PHOS  --  7.1* 7.2* 6.6*   Liver Function Tests: Recent Labs  Lab 06/18/22 0750 06/19/22 0056 06/21/22 0532 06/22/22 0415  AST 23 39  --   --   ALT 21 24  --   --   ALKPHOS 71 65  --   --   BILITOT 0.7 1.6*  --   --   PROT 6.0* 5.7*  --   --   ALBUMIN 1.7* 1.9* 1.6* <1.5*   No  results for input(s): "LIPASE", "AMYLASE" in the last 168 hours. No results for input(s): "AMMONIA" in the last 168 hours. CBC: Recent Labs  Lab 06/18/22 0750 06/18/22 1755 06/19/22 0056 06/20/22 0448 06/21/22 0531 06/22/22 0415  WBC 27.8*  --  24.0* 24.8* 35.4* 34.7*  NEUTROABS 26.7*  --   --   --   --   --   HGB 5.9*   < > 10.4* 9.2* 11.3* 10.9*  HCT 18.7*   < > 30.0* 27.6* 32.6* 32.0*  MCV 91.7  --  88.2 89.3 88.1 88.6  PLT 353  --  255 219 241 198   < > = values in this interval not displayed.   Cardiac Enzymes: No results for input(s): "CKTOTAL", "CKMB", "CKMBINDEX", "TROPONINI" in the last 168 hours. CBG: Recent Labs  Lab 06/21/22 0803 06/21/22 1156 06/21/22 1655 06/21/22 2248 06/22/22 0806  GLUCAP 232* 166* 160* 167* 163*    Iron Studies: No results for input(s): "IRON", "TIBC", "TRANSFERRIN", "FERRITIN" in the last 72 hours. Studies/Results: No results found. Medications: Infusions:  ampicillin-sulbactam (UNASYN) IV 3 g (06/22/22 0600)    Scheduled  Medications:  acetaminophen  1,000 mg Oral Q6H   Chlorhexidine Gluconate Cloth  6 each Topical Daily   darbepoetin (ARANESP) injection - NON-DIALYSIS  100 mcg Subcutaneous Q Mon-1800   heparin injection (subcutaneous)  5,000 Units Subcutaneous Q8H   insulin aspart  0-15 Units Subcutaneous TID WC   insulin aspart  0-5 Units Subcutaneous QHS   pantoprazole (PROTONIX) IV  40 mg Intravenous QHS   sertraline  100 mg Oral QHS   sodium bicarbonate  1,300 mg Oral BID    have reviewed scheduled and prn medications.  Physical Exam: General: alert, NAD Heart: RRR Lungs: mostly clear Abdomen: obese, post op Extremities: trace edema    06/22/2022,12:12 PM  LOS: 4 days

## 2022-06-22 NOTE — TOC Initial Note (Addendum)
Transition of Care [facility]) - Initial/Assessment Note    Patient Details  Name: Jacqueline [name] MRN: [id number] Date of Birth: June 14, 1981  Transition of [facility]) CM/SW Contact:    Carles Collet, RN Phone Number: 06/22/2022, 11:34 AM  Clinical Narrative:                 Spoke at bedside with patient and mother.  Patient will need [place] RN for NS W/D dressings. Patient states that both her mother and spiuse will be able to assist with dressing changes after DC.  She does not have any ambulatory DME at home PT OT evals pending, with deconditioning and location of wound it is likely recs for [facility] PT will be made. We discussed HH and she is agreeable. Alvis Lemmings accepted for [facility] RN PT OT. CM placed HH order. I requested nursing staff to continue to educate family on how to do dressing changes so they are prepared for discharge.    Expected Discharge Plan: [place] Barriers to Discharge: Continued Medical Work up   Patient Goals and CMS Choice Patient states their goals for this hospitalization and ongoing recovery are:: to go home CMS Medicare.gov Compare Post Acute Care list provided to:: Patient Choice offered to / list presented to : Patient  Expected Discharge Plan and Services Expected Discharge Plan: [place]   Discharge Planning Services: CM Consult Post Acute Care Choice: [place] arrangements for the past 2 months: Single Family Home                           HH Arranged: RN, PT, OT HH Agency: [place] Date [facility] Agency Contacted: 06/22/22 Time HH Agency Contacted: 12 Representative spoke with at [place]: [place] Arrangements/Services Living arrangements for the past 2 months: [place] with:: Spouse, Minor Children   Do you feel safe going back to the place where you live?: Yes               Activities of Daily Living Home Assistive Devices/Equipment: None ADL Screening (condition  at time of admission) Patient's cognitive ability adequate to safely complete daily activities?: Yes Is the patient deaf or have difficulty hearing?: No Does the patient have difficulty seeing, even when wearing glasses/contacts?: No Does the patient have difficulty concentrating, remembering, or making decisions?: No Patient able to express need for assistance with ADLs?: No Does the patient have difficulty dressing or bathing?: No Independently performs ADLs?: Yes (appropriate for developmental age) Does the patient have difficulty walking or climbing stairs?: No Weakness of Legs: None Weakness of Arms/Hands: None  Permission Sought/Granted                  Emotional Assessment              Admission diagnosis:  Necrotizing fasciitis ([place]) [M72.6] Fasciitis [M72.9] Patient Active Problem List   Diagnosis Date Noted   Fasciitis 06/18/2022   Viral illness 08/17/2020   Elevated d-dimer 08/17/2020   Acute lower UTI 08/17/2020   Hypoalbuminemia 33/29/5188   Complicated migraine 41/66/0630   Right hemiplegia ([place]) 12/02/2019   Hypertension    AKI (acute kidney injury) ([place])    CKD (chronic kidney disease) stage 3, GFR 30-59 ml/min    Obesity, Class III, BMI 40-49.9 (morbid obesity) ([place])    Insulin dependent diabetes mellitus    Anemia of chronic disease  HLD (hyperlipidemia) 07/02/2018   Acute on chronic diastolic CHF (congestive heart failure) ([place]) 07/02/2018   Depression 07/02/2018   OSA (obstructive sleep apnea) 07/02/2018   Acute on chronic diastolic (congestive) heart failure ([place]) 07/02/2018   Cellulitis of right lower extremity 07/02/2018   Anemia 07/02/2018   Type 2 diabetes mellitus with hyperglycemia, with long-term current use of insulin ([place]) 07/02/2018   Chest pain 05/04/2018   Hypertensive urgency 05/03/2018   Left facial numbness 05/03/2018   Headache 05/03/2018   Elevated troponin 05/03/2018   Hyperlipidemia associated with type 2 diabetes  mellitus ([place]) 03/08/2018   Hypertension associated with stage 3 chronic kidney disease due to type 2 diabetes mellitus ([place]) 03/08/2018   Vitamin D deficiency 12/21/2017   Generalized anxiety disorder 08/07/2017   Diabetic peripheral neuropathy ([place]) 06/21/2017   History of asthma 06/21/2017   Steatosis of liver 06/21/2017   Microalbuminuric diabetic nephropathy ([place]) 06/01/2017   Raised TSH level 06/01/2017   Thrombocytosis 06/01/2017   Morbid obesity with BMI of 40.0-44.9, adult ([place]) 05/30/2017   Allergic rhinitis 05/30/2017   PAF (paroxysmal atrial fibrillation) ([place]) 05/25/2017   Sepsis ([place]) 05/03/2017    Class: Present on Admission   Acute renal failure superimposed on stage 3 chronic kidney disease ([place]) 05/03/2017    Class: Stage 3   Neck pain    Acidosis, metabolic    Hyponatremia    Type II diabetes mellitus with renal manifestations ([place]) 04/30/2017   Normocytic anemia 04/30/2017   Staphylococcus aureus bacteremia 03/15/2016   Streptococcal bacteremia 03/15/2016   Tachycardia 03/14/2016   Fever 03/14/2016   Bad headache 03/14/2016   Morbid obesity ([place])    Diabetes ([place]) 01/01/2014   PCP:  Audley Hose, MD Pharmacy:   [id number] - [place], [place] - [address] [place] [address] [place] [address] Phone: [phone] Fax: [phone]     Social Determinants of Health (SDOH) Interventions    Readmission Risk Interventions     No data to display

## 2022-06-22 NOTE — Progress Notes (Addendum)
2 Days Post-Op  Subjective: CC: Patient with n/v ~2 minutes after taking oxy this am. Became nauseated again after morphine. Tolerated dilaudid yesterday. She has been tolerating oxycodone previously. Denies abdominal pain. Some pain at her wounds. Notes numbness of the R leg that may be a little better. More numbness on the medial aspect. Has some difficulty with knee flexion but can demonstrate that + dorsiflexion/plantar flexion of the RLE. Has not been oob. Still with foley - no I/O documented, she does have urine in foley bag. Tolerating diet. No n/v related to po intake.   Objective: Vital signs in last 24 hours: Temp:  [97.5 F (36.4 C)-98.3 F (36.8 C)] 98 F (36.7 C) (12/06 0800) Pulse Rate:  [83-88] 83 (12/06 0400) Resp:  [16-19] 18 (12/06 0400) BP: (133-148)/(80-87) 133/86 (12/06 0400) SpO2:  [97 %-99 %] 98 % (12/06 0400) Last BM Date : 06/19/22  Intake/Output from previous day: No intake/output data recorded. Intake/Output this shift: No intake/output data recorded.  PE: Gen:  Alert, NAD, pleasant Card:  Re grate  Pulm: Rate and effort normal Abd: Soft, ND, NT Wound: Seen with my colleague. Groin and right thigh wounds are clean and stable from pictures yesterday. R groin wound tracks a few cm laterally and inferiorly, no unbroken loculations are noted. Does not seem to track medially. R thigh wound with mixed adipose/granulation tissue. Muscle belly present in central/proximal portion of the wound. Below muscle belly there are noted vessels, no bleeding. Some thin serous fluid under muscle belly, but otherwise no drainage. Periwound was very clean.      Ext: RLE. SILT to light touch for the RLE but decreased medial > lateral to wound of upper thigh. She reports her SILT and equal to LLE for the lower leg. Able to demonstrate dorsiflexion/plantar flexion of the RLE. DP 2+.  Psych: A&Ox3   Lab Results:  Recent Labs    06/21/22 0531 06/22/22 0415  WBC 35.4*  34.7*  HGB 11.3* 10.9*  HCT 32.6* 32.0*  PLT 241 198   BMET Recent Labs    06/21/22 0532 06/22/22 0415  NA 134* 132*  K 4.4 3.8  CL 106 104  CO2 16* 15*  GLUCOSE 199* 143*  BUN 79* 87*  CREATININE 6.03* 6.62*  CALCIUM 7.3* 6.9*   PT/INR No results for input(s): "LABPROT", "INR" in the last 72 hours. CMP     Component Value Date/Time   NA 132 (L) 06/22/2022 0415   K 3.8 06/22/2022 0415   CL 104 06/22/2022 0415   CO2 15 (L) 06/22/2022 0415   GLUCOSE 143 (H) 06/22/2022 0415   BUN 87 (H) 06/22/2022 0415   CREATININE 6.62 (H) 06/22/2022 0415   CALCIUM 6.9 (L) 06/22/2022 0415   PROT 5.7 (L) 06/19/2022 0056   ALBUMIN <1.5 (L) 06/22/2022 0415   AST 39 06/19/2022 0056   ALT 24 06/19/2022 0056   ALKPHOS 65 06/19/2022 0056   BILITOT 1.6 (H) 06/19/2022 0056   GFRNONAA 7 (L) 06/22/2022 0415   GFRAA 31 (L) 12/03/2019 0345   Lipase     Component Value Date/Time   LIPASE 40 08/27/2021 0811    Studies/Results: No results found.  Anti-infectives: Anti-infectives (From admission, onward)    Start     Dose/Rate Route Frequency Ordered Stop   06/22/22 0600  ceFAZolin (ANCEF) IVPB 2g/100 mL premix  Status:  Discontinued        2 g 200 mL/hr over 30 Minutes Intravenous On call to O.R.  06/21/22 1829 06/21/22 1830   06/21/22 1700  Ampicillin-Sulbactam (UNASYN) 3 g in sodium chloride 0.9 % 100 mL IVPB        3 g 200 mL/hr over 30 Minutes Intravenous Every 12 hours 06/21/22 1435     06/18/22 1900  linezolid (ZYVOX) IVPB 600 mg        600 mg 300 mL/hr over 60 Minutes Intravenous Every 12 hours 06/18/22 1702     06/18/22 1715  meropenem (MERREM) 1 g in sodium chloride 0.9 % 100 mL IVPB  Status:  Discontinued        1 g 200 mL/hr over 30 Minutes Intravenous Every 12 hours 06/18/22 1709 06/21/22 1411   06/18/22 0745  piperacillin-tazobactam (ZOSYN) IVPB 3.375 g        3.375 g 100 mL/hr over 30 Minutes Intravenous  Once 06/18/22 0733 06/18/22 0835        Assessment/Plan POD  4/2, s/p excisional debridement of NSTI of right groin/thigh, Dr. Toy Cookey, Dr. Mable Fill (ortho) - Cx w/ moderate strep anginosis w/ mixed anaerobic flora present. Cont abx therapy. - Unclear why she has persistent leukocytosis (34.7). Her wound looks clean and there does not appear to be anything further that needs debridement. She is HDS without fever, tachycardia or hypotension. Will observe for now.  - Do not anticipate a need to return to the OR at this point - Cont NS WD dressing changes to wound BID  - PT eval due to neurologic complaints in the right leg to assist with mobilization   FEN - renal carb mod diet VTE - SCDs, subq heparin ID - Zyvox/Unasyn (per ID) Foley - would defer timing of removal to primary/nephrology   Acute on CKD - per nephrology  HTN DM Obesity OSA     LOS: 4 days    Jillyn Ledger , [facility] Surgery 06/22/2022, 9:46 AM Please see Amion for pager number during day hours 7:00am-4:30pm

## 2022-06-22 NOTE — Plan of Care (Signed)

## 2022-06-22 NOTE — Evaluation (Signed)
Physical Therapy Evaluation Patient Details Name: Jacqueline [name] MRN: [id number] DOB: Jul 26, 1980 Today's Date: 06/22/2022  History of Present Illness  Patient is 41 y.o. female admitted for Rt psoas abscess and pelvic necrotizing fasciitis extendign to Rt thigh. Now s/p excisional debridement of skin, muscle (iliacus, psoas, transversus abdominis), fascia of pelvis, abdominal wall and retroperitoneum dissection on 12/2.   Clinical Impression  Jacqueline [name] is 41 y.o. female admitted with above HPI and diagnosis. Patient is currently limited by functional impairments below (see PT problem list). Patient lives with her family and is independent at baseline caring for her family and working full-time. Pt able to sit up EOB with min assist for Rt LE due to weakness and pain. She reports ongoing numbness or slightly reduced sensation in Rt thigh but reports improvement in lower leg. Strength is good for ankle Df/PF but further testing limited due to Rt thigh pain. Pt able to complete sit<>stand with Mod+2 assist and RW for support. C/o dizziness with activity and BP monitored (see below) with orthostatic drop noted. Pt returned to supine and bed placed in chair position to encourage upright sitting/activity. Patient will benefit from continued skilled PT interventions to address impairments and progress independence with mobility, recommending HHPT with anticipate that pt will progress well with mobility. Acute PT will follow and progress as able.     Position BP (mmHg)  Supine 163/90  Sitting EOB 144/85  Sitting EOB after stand 122/79  Supine 152/93       Recommendations for follow up therapy are one component of a multi-disciplinary discharge planning process, led by the attending physician.  Recommendations may be updated based on patient status, additional functional criteria and insurance authorization.  Follow Up Recommendations Home health PT (hopeful pt will progress)      Assistance  Recommended at Discharge Frequent or constant Supervision/Assistance  Patient can return home with the following  A lot of help with walking and/or transfers;A lot of help with bathing/dressing/bathroom;Assistance with cooking/housework;Direct supervision/assist for medications management;Assist for transportation;Help with stairs or ramp for entrance    Equipment Recommendations Rolling walker (2 wheels);Wheelchair (measurements PT);Wheelchair cushion (measurements PT) (TBD as pt progresses)  Recommendations for Other Services       Functional Status Assessment Patient has had a recent decline in their functional status and demonstrates the ability to make significant improvements in function in a reasonable and predictable amount of time.     Precautions / Restrictions Precautions Precautions: Fall Restrictions Weight Bearing Restrictions: No RLE Weight Bearing: Weight bearing as tolerated      Mobility  Bed Mobility Overal bed mobility: Needs Assistance Bed Mobility: Supine to Sit, Sit to Supine     Supine to sit: HOB elevated, Min assist Sit to supine: Mod assist    cues to use bed rail and walk LE's off EOB. Pt initiated moves to sit up, min assist to bring Rt LE off EOB fully. EOS Mod assist required to control lowering trunk and bring bil LE's onto bed.     Transfers Overall transfer level: Needs assistance Equipment used: Rolling walker (2 wheels) Transfers: Sit to/from Stand Sit to Stand: Mod assist, +2 physical assistance, +2 safety/equipment        Mod+2 for safety with sit<>stand. cues needed for hand placement on EOB for rise and assist to steady during hand transition to RW. no buckling in standing and pt able to take small side step to EOB. Limited by c/o dizziness.  Ambulation/Gait             Pre-gait activities: small side step to [place]            Wheelchair Mobility    Modified Rankin (Stroke Patients Only)        Balance Overall balance assessment: Needs assistance Sitting-balance support: Feet supported, Bilateral upper extremity supported Sitting balance-Leahy Scale: Fair     Standing balance support: During functional activity, Reliant on assistive device for balance, Bilateral upper extremity supported Standing balance-Leahy Scale: Poor                               Pertinent Vitals/Pain Pain Assessment Pain Assessment: Faces Faces Pain Scale: Hurts whole lot Pain Location: Rt thigh Pain Descriptors / Indicators: Discomfort Pain Intervention(s): Limited activity within patient's tolerance, Monitored during session, Repositioned    Home Living Family/patient expects to be discharged to:: Private residence Living Arrangements: Spouse/significant other;Children Available Help at Discharge: Family Type of Home: House Home Access: Level entry;Stairs to enter Entrance Stairs-Rails: None Entrance Stairs-Number of Steps: 1 stoop Alternate Level Stairs-Number of Steps: flight Home Layout: Two level;Able to live on main level with bedroom/bathroom;Bed/bath upstairs Home Equipment: None Additional Comments: pt lives with husband and 2 children (52 y.o. and 35 y.o.) her mother will come assist her at home as needed.    Prior Function Prior Level of Function : Independent/Modified Independent;Working/employed;Driving                     Hand Dominance   Dominant Hand: Right    Extremity/Trunk Assessment   Upper Extremity Assessment Upper Extremity Assessment: Overall WFL for tasks assessed    Lower Extremity Assessment Lower Extremity Assessment: RLE deficits/detail RLE Deficits / Details: good quad activation and able to to initiate moving Rt LE to EOB with adductor acitvation, assist for hip flexion secondary to pain. 4+/5 for dorsi/plantar flexion RLE: Unable to fully assess due to pain RLE Sensation: decreased light touch (faint sensation around thigh)     Cervical / Trunk Assessment Cervical / Trunk Assessment: Normal  Communication   Communication: No difficulties  Cognition Arousal/Alertness: Awake/alert Behavior During Therapy: WFL for tasks assessed/performed Overall Cognitive Status: Within Functional Limits for tasks assessed                                          General Comments      Exercises     Assessment/Plan    PT Assessment Patient needs continued PT services  PT Problem List Decreased strength;Decreased activity tolerance;Decreased balance;Decreased mobility;Decreased knowledge of use of DME;Decreased safety awareness;Decreased knowledge of precautions;Impaired sensation;Pain;Obesity       PT Treatment Interventions DME instruction;Gait training;Stair training;Functional mobility training;Therapeutic activities;Therapeutic exercise;Balance training;Patient/family education    PT Goals (Current goals can be found in the Care Plan section)  Acute Rehab PT Goals Patient Stated Goal: get stronger, able to return home PT Goal Formulation: With patient Time For Goal Achievement: 07/06/22 Potential to Achieve Goals: Good    Frequency Min 4X/week     Co-evaluation               AM-PAC PT "6 Clicks" Mobility  Outcome Measure Help needed turning from your back to your side while in a flat bed without using bedrails?: A Little Help  needed moving from lying on your back to sitting on the side of a flat bed without using bedrails?: A Little Help needed moving to and from a bed to a chair (including a wheelchair)?: A Lot Help needed standing up from a chair using your arms (e.g., wheelchair or bedside chair)?: A Lot Help needed to walk in hospital room?: Total Help needed climbing 3-5 steps with a railing? : Total 6 Click Score: 12    End of Session Equipment Utilized During Treatment: Gait belt Activity Tolerance: Patient tolerated treatment well Patient left: with call bell/phone  within reach;in bed;with family/visitor present (chair position of bed) Nurse Communication: Mobility status (IV alarm "air in line") PT Visit Diagnosis: Other abnormalities of gait and mobility (R26.89);Muscle weakness (generalized) (M62.81);Difficulty in walking, not elsewhere classified (R26.2)    Time: 9437-0052 PT Time Calculation (min) (ACUTE ONLY): 33 min   Charges:   PT Evaluation $PT Eval Moderate Complexity: 1 Mod PT Treatments $Therapeutic Activity: 8-22 mins        Verner Mould, DPT Acute Rehabilitation Services Office [phone]  06/22/22 11:51 AM

## 2022-06-22 NOTE — Progress Notes (Signed)
Initial Nutrition Assessment  DOCUMENTATION CODES:   Not applicable  INTERVENTION:   Liberalize pt diet tot regular due to poor appetite and increased needs.  Encourage good PO intake Ensure Enlive po BID, each supplement provides 350 kcal and 20 grams of protein. 30 ml ProSource Plus BID, each supplement provides 100 kcals and 15 grams protein.   NUTRITION DIAGNOSIS:   Increased nutrient needs related to wound healing as evidenced by estimated needs.  GOAL:   Patient will meet greater than or equal to 90% of their needs  MONITOR:   PO intake, Supplement acceptance, Labs, Skin  REASON FOR ASSESSMENT:   Other (Comment) (Wounds)    ASSESSMENT:   41 y.o. female presented to the ED with leg pain, N/V, and fever. PMH includes HTN, anemia, gastric bypass, T2DM, CKD III, and HLD. Pt admitted with necrotizing fascitis in R thigh and AKI on CKD.   12/02 - debridement of skin, muscle (iliacus, psoas, transversus abdominis), fascia of pelvis, abdominal wall and retroperitoneum dissection of 20 x 20 cm along iliac ala  12/04 - I&D R thigh and groin  Pt reports that her appetite has been really poor since surgery, states that she is having nausea and vomiting. Reports that her appetite was great PTA with no weight loss. No weights within the past year in EMR to assess for wt loss. Discussed the use of oral nutritional supplements with pt to provide support with poor appetite and wound healing. Pt agreeable to trying nutritional supplements.   Meal Intakes 12/03: 25-50% x 3 meals (average 33%)  Per Surgery notes, no plan for additional surgery at this time.   Medications reviewed and include: Augmentin, NovoLog SSI, Protonix, Sodium Bicarbonate tablet Labs reviewed: Potassium 132, BUN 87, Creatinine 6.62, Phosphorus 6.6, Hgb A1c 5.4%, 24 hr CBGs 120-167  NUTRITION - FOCUSED PHYSICAL EXAM:  Deferred to follow-up.   Diet Order:   Diet Order             Diet regular Room service  appropriate? Yes; Fluid consistency: Thin  Diet effective now                   EDUCATION NEEDS:   Education needs have been addressed  Skin:  Skin Assessment: Skin Integrity Issues: Skin Integrity Issues:: Incisions Incisions: R groin/leg  Last BM:  12/5 - type 5  Height:   Ht Readings from Last 1 Encounters:  06/18/22 5\' 5"  (1.651 m)    Weight:   Wt Readings from Last 1 Encounters:  06/18/22 91.5 kg    Ideal Body Weight:  56.8 kg  BMI:  Body mass index is 33.57 kg/m.  Estimated Nutritional Needs:  Kcal:  2200-2400 Protein:  110-125 grams Fluid:  >/= 2 L   Hermina Barters RD, LDN Clinical Dietitian See [facility] for contact information.

## 2022-06-22 NOTE — Progress Notes (Signed)
[facility]  Date of Admission:  06/18/2022      Total days of antibiotics 5  Linezolid 12/02 >> 12/06  Unasyn 12/05 >> 12/06 >> Augmentin PO  Merrem 12/02 >> 12/04         ASSESSMENT: Olla Delancey is a 41 y.o. female with necrotizing fasciitis involving right anterior thigh and pelvic/abdomen. Intra-op cultures polymicrobial with moderate growth strep anginosis and mixed anaerobes. NO further plans for surgery and wound appears clean/viable. Surgery following. We can switch to PO augmentin to complete an additional 7 day course. Complete linezolid today.   A/C CKD - maintaining some urine output. Presumed 2/2 HTN, DM seems well controlled with diet/lifestyle with A1C > 6% drawn this visit. Nephrology following. SCr 6.62. Will adjust Augmentin dose renally.   Leukocytosis - trending down this AM, still 34.7K. No new infectious concerns; expect this will continue to decrease as she recovers from multiple surgeries and severe infection. No fevers. Not bacteremic (day 4).   ID will sign off - please call back with any questions/concerns or if we can be of further assistance. She can follow up with PCP and her surgery team. Discussed with Ms. Benda that her wound will persist longer than her antibiotic need will; attentive wound care, nutrition and glycemic control will be essential for her.     PLAN: Can switch to Augmentin (renal dosed) x 7d PO to complete therapy FU with PCP/Gen Surg Anticipate continued downward trend of wbc count as she recovers - outpatient follow up    Principal Problem:   Fasciitis Active Problems:   Diabetes ([place])   Type II diabetes mellitus with renal manifestations (HCC)   Sepsis ([place])   Acute renal failure superimposed on stage 3 chronic kidney disease (HCC)    acetaminophen  1,000 mg Oral Q6H   Chlorhexidine Gluconate Cloth  6 each Topical Daily   darbepoetin (ARANESP) injection - NON-DIALYSIS  100 mcg  Subcutaneous Q Mon-1800   heparin injection (subcutaneous)  5,000 Units Subcutaneous Q8H   insulin aspart  0-15 Units Subcutaneous TID WC   insulin aspart  0-5 Units Subcutaneous QHS   pantoprazole (PROTONIX) IV  40 mg Intravenous QHS   sertraline  100 mg Oral QHS   sodium bicarbonate  1,300 mg Oral BID    SUBJECTIVE: Doing well this morning. Has family present in the room. Thankful that her wound appears clean and no need for repeat surgery.    Review of Systems: Review of Systems  Constitutional:  Negative for chills and fever.  Respiratory: Negative.    Cardiovascular: Negative.   Gastrointestinal:  Positive for nausea. Negative for abdominal pain, diarrhea and vomiting.  Genitourinary: Negative.   Musculoskeletal:        Pain r/t surgery   Skin:  Negative for rash.  Neurological:  Negative for dizziness and headaches.    No Known Allergies  OBJECTIVE: Vitals:   06/21/22 2000 06/22/22 0000 06/22/22 0400 06/22/22 0800  BP: (!) 146/86 (!) 148/80 133/86   Pulse: 88 86 83   Resp: 19 16 18    Temp: 98.1 F (36.7 C) 97.8 F (36.6 C) 98.3 F (36.8 C) 98 F (36.7 C)  TempSrc: Oral Oral Oral Oral  SpO2: 97% 99% 98%   Weight:      Height:       Body mass index is 33.57 kg/m.  Physical Exam Vitals reviewed.  Constitutional:      Appearance: Normal  appearance. She is not ill-appearing.  HENT:     Mouth/Throat:     Mouth: Mucous membranes are moist.     Pharynx: Oropharynx is clear.  Eyes:     General: No scleral icterus. Cardiovascular:     Rate and Rhythm: Normal rate and regular rhythm.  Pulmonary:     Effort: Pulmonary effort is normal.  Musculoskeletal:     Comments: Reviewed wound photos from Gen Surgery taken this AM   Skin:    General: Skin is warm and dry.     Findings: No rash.  Neurological:     Mental Status: She is oriented to person, place, and time.  Psychiatric:        Mood and Affect: Mood normal.        Thought Content: Thought content  normal.        Lab Results Lab Results  Component Value Date   WBC 34.7 (H) 06/22/2022   HGB 10.9 (L) 06/22/2022   HCT 32.0 (L) 06/22/2022   MCV 88.6 06/22/2022   PLT 198 06/22/2022    Lab Results  Component Value Date   CREATININE 6.62 (H) 06/22/2022   BUN 87 (H) 06/22/2022   NA 132 (L) 06/22/2022   K 3.8 06/22/2022   CL 104 06/22/2022   CO2 15 (L) 06/22/2022    Lab Results  Component Value Date   ALT 24 06/19/2022   AST 39 06/19/2022   ALKPHOS 65 06/19/2022   BILITOT 1.6 (H) 06/19/2022     Microbiology: Recent Results (from the past 240 hour(s))  Resp Panel by RT-PCR (Flu A&B, Covid) Anterior Nasal Swab     Status: None   Collection Time: 06/18/22  3:31 AM   Specimen: Anterior Nasal Swab  Result Value Ref Range Status   SARS Coronavirus 2 by RT PCR NEGATIVE NEGATIVE Final    Comment: (NOTE) SARS-CoV-2 target nucleic acids are NOT DETECTED.  The SARS-CoV-2 RNA is generally detectable in upper respiratory specimens during the acute phase of infection. The lowest concentration of SARS-CoV-2 viral copies this assay can detect is 138 copies/mL. A negative result does not preclude SARS-Cov-2 infection and should not be used as the sole basis for treatment or other patient management decisions. A negative result may occur with  improper specimen collection/handling, submission of specimen other than nasopharyngeal swab, presence of viral mutation(s) within the areas targeted by this assay, and inadequate number of viral copies(<138 copies/mL). A negative result must be combined with clinical observations, patient history, and epidemiological information. The expected result is Negative.  Fact Sheet for Patients:  EntrepreneurPulse.com.au  Fact Sheet for Healthcare Providers:  IncredibleEmployment.be  This test is no t yet approved or cleared by the [place] FDA and  has been authorized for detection and/or diagnosis of  SARS-CoV-2 by FDA under an Emergency Use Authorization (EUA). This EUA will remain  in effect (meaning this test can be used) for the duration of the COVID-19 declaration under Section 564(b)(1) of the Act, 21 U.S.C.section 360bbb-3(b)(1), unless the authorization is terminated  or revoked sooner.       Influenza A by PCR NEGATIVE NEGATIVE Final   Influenza B by PCR NEGATIVE NEGATIVE Final    Comment: (NOTE) The Xpert Xpress SARS-CoV-2/FLU/RSV plus assay is intended as an aid in the diagnosis of influenza from Nasopharyngeal swab specimens and should not be used as a sole basis for treatment. Nasal washings and aspirates are unacceptable for Xpert Xpress SARS-CoV-2/FLU/RSV testing.  Fact Sheet for Patients:  EntrepreneurPulse.com.au  Fact Sheet for Healthcare Providers: IncredibleEmployment.be  This test is not yet approved or cleared by the [place] FDA and has been authorized for detection and/or diagnosis of SARS-CoV-2 by FDA under an Emergency Use Authorization (EUA). This EUA will remain in effect (meaning this test can be used) for the duration of the COVID-19 declaration under Section 564(b)(1) of the Act, 21 U.S.C. section 360bbb-3(b)(1), unless the authorization is terminated or revoked.  Performed at [facility], [place] [address], [place], [place] [address]   Culture, blood (routine x 2)     Status: None (Preliminary result)   Collection Time: 06/18/22  7:30 AM   Specimen: BLOOD  Result Value Ref Range Status   Specimen Description BLOOD SITE NOT SPECIFIED  Final   Special Requests   Final    BOTTLES DRAWN AEROBIC AND ANAEROBIC Blood Culture results may not be optimal due to an inadequate volume of blood received in culture bottles   Culture   Final    NO GROWTH 4 DAYS Performed at [facility], [place] [address], [place], [place] [address]    Report Status PENDING  Incomplete  Culture, blood (routine x 2)      Status: None (Preliminary result)   Collection Time: 06/18/22  7:51 AM   Specimen: BLOOD  Result Value Ref Range Status   Specimen Description BLOOD SITE NOT SPECIFIED  Final   Special Requests   Final    BOTTLES DRAWN AEROBIC AND ANAEROBIC Blood Culture adequate volume   Culture   Final    NO GROWTH 4 DAYS Performed at [facility], [address] [place], [place] [address]    Report Status PENDING  Incomplete  Aerobic/Anaerobic Culture w Gram Stain (surgical/deep wound)     Status: None   Collection Time: 06/18/22  3:04 PM   Specimen: PATH Other; Body Fluid  Result Value Ref Range Status   Specimen Description FLUID RIGHT THIGH  Final   Special Requests PT ON ANCEF  Final   Gram Stain   Final    RARE WBC PRESENT, PREDOMINANTLY PMN FEW GRAM POSITIVE COCCI FEW GRAM NEGATIVE RODS Performed at [facility], [place] [address], [place], [place] [address]    Culture   Final    MODERATE STREPTOCOCCUS ANGINOSIS MIXED ANAEROBIC FLORA PRESENT.  CALL LAB IF FURTHER IID REQUIRED.    Report Status 06/21/2022 FINAL  Final   Organism ID, Bacteria STREPTOCOCCUS ANGINOSIS  Final      Susceptibility   Streptococcus anginosis - MIC*    PENICILLIN <=0.06 SENSITIVE Sensitive     CEFTRIAXONE <=0.12 SENSITIVE Sensitive     ERYTHROMYCIN 4 RESISTANT Resistant     LEVOFLOXACIN 0.5 SENSITIVE Sensitive     VANCOMYCIN 0.5 SENSITIVE Sensitive     * MODERATE STREPTOCOCCUS ANGINOSIS  MRSA Next Gen by PCR, Nasal     Status: None   Collection Time: 06/18/22  5:55 PM   Specimen: Nasal Mucosa; Nasal Swab  Result Value Ref Range Status   MRSA by PCR Next Gen NOT DETECTED NOT DETECTED Final    Comment: (NOTE) The GeneXpert MRSA Assay (FDA approved for NASAL specimens only), is one component of a comprehensive MRSA colonization surveillance program. It is not intended to diagnose MRSA infection nor to guide or monitor treatment for MRSA infections. Test performance is not FDA approved in patients  less than 12 years old. Performed at [facility], [place] [address], [place], [place]  [place], MSN, NP-C [facility] [email or web address] Pager: [phone] Office: [phone] [facility] Main Line: [place] Communication Welcome

## 2022-06-22 NOTE — Progress Notes (Signed)
PROGRESS NOTE        PATIENT DETAILS Name: Jacqueline [name] Last Age: 41 y.o. Sex: female Date of Birth: 23-Jul-1980 Admit Date: 06/18/2022 Admitting Physician Arta Bruce Kinsinger, MD WHQ:[id number], Larey Dresser, MD  Brief Summary: Patient is a 41 y.o.  female with history of CKD stage IV, DM-2, HTN, s/p gastric bypass 2020-presenting with severe sepsis due to necrotizing fasciitis of right thigh/groin-evaluated by orthopedics/general surgery-and underwent right thigh I&D/debridement-and was subsequently admitted by PCCM postoperatively.  She was stabilized and subsequently transferred to [facility] on 12/5.  Significant events: 12/2>> presenting with right thigh necrotizing fasciitis-s/p debridement by Ortho/general surgery-admit to ICU postoperatively. 12/4>> irrigation/debridement by general surgery 12/5>> transferred to [facility].  Significant studies: 12/2>> CT abdomen/pelvis: Findings concerning for necrotizing fasciitis in the right thigh.  Probable right psoas muscle abscess. 12/2>> CT of the right lower extremity: Necrotizing fasciitis/infectious myositis of right thigh.  Significant microbiology data: 12/2>> COVID/influenza PCR: Negative 12/2>> right thigh tissue: Streptococcus anginosus/mixed anaerobic flora. 12/2>> blood culture: No growth  Procedures: 12/2>> right thigh I&D by Ortho, advised pelvis I&D by general surgery 12/4>> right thigh/groin irrigation and debridement  Consults: Orthopedics General surgery Infectious disease Nephrology PCCM  Subjective: Lying comfortably in bed-denies any chest pain or shortness of breath.  Objective: Vitals: Blood pressure 133/86, pulse 83, temperature 98 F (36.7 C), temperature source Oral, resp. rate 18, height 5\' 5"  (1.651 m), weight 91.5 kg, last menstrual period 06/15/2022, SpO2 98 %.   Exam: Gen Exam:Alert awake-not in any distress HEENT:atraumatic, normocephalic Chest: B/L clear to auscultation  anteriorly CVS:S1S2 regular Abdomen:soft non tender, non distended Extremities:no edema Neurology: Non focal Skin: no rash  Pertinent Labs/Radiology:    Latest Ref Rng & Units 06/22/2022    4:15 AM 06/21/2022    5:31 AM 06/20/2022    4:48 AM  CBC  WBC 4.0 - 10.5 K/uL 34.7  35.4  24.8   Hemoglobin 12.0 - 15.0 g/dL 10.9  11.3  9.2   Hematocrit 36.0 - 46.0 % 32.0  32.6  27.6   Platelets 150 - 400 K/uL 198  241  219     Lab Results  Component Value Date   NA 132 (L) 06/22/2022   K 3.8 06/22/2022   CL 104 06/22/2022   CO2 15 (L) 06/22/2022      Assessment/Plan: Severe sepsis due to necrotizing fasciitis involving right thigh/pelvis Sepsis physiology has resolved-s/p I&D on 12/2 and 12/4 Continues to have significant leukocytosis-nontoxic-appearing, but per general surgery-wound is clean and without any evidence of infection. ID following-with plans to transition to Augmentin today.  No longer on Zyvox.  AKI on CKD 4 AKI-likely due to ischemic ATN in the setting of sepsis physiology Nephrology following-we will defer further to nephrology service.  Hyperkalemia Resolved  Metabolic acidosis Due to CKD On oral bicarb supplementation  Normocytic anemia Due to a combination of anemia due to CKD and acute illness. Darbepoetin/iron defer to nephrology service Follow CBC  HTN BP currently stable All antihypertensives on hold-resume when able  DM-2 (A1c 5.4 on 12/5) with hyperglycemia Not on any medications at home Stable on SSI currently  Recent Labs    06/21/22 1655 06/21/22 2248 06/22/22 0806  GLUCAP 160* 167* 163*     Hyponatremia Mild Follow-up for now  Depression Stable Zoloft  BMI: Estimated body mass index is 33.57 kg/m as calculated from the  following:   Height as of this encounter: 5\' 5"  (1.651 m).   Weight as of this encounter: 91.5 kg.   Code status:   Code Status: Prior   DVT Prophylaxis: heparin injection 5,000 Units Start: 06/18/22  1715   Family Communication: Spouse at bedside   Disposition Plan: Status is: Inpatient Remains inpatient appropriate because: Severity of illness   Planned Discharge Destination: Home health versus SNF   Diet: Diet Order             Diet renal/carb modified with fluid restriction Diet-HS Snack? Nothing; Fluid restriction: 1200 mL Fluid; Room service appropriate? Yes; Fluid consistency: Thin  Diet effective now                     Antimicrobial agents: Anti-infectives (From admission, onward)    Start     Dose/Rate Route Frequency Ordered Stop   06/22/22 0600  ceFAZolin (ANCEF) IVPB 2g/100 mL premix  Status:  Discontinued        2 g 200 mL/hr over 30 Minutes Intravenous On call to O.R. 06/21/22 1829 06/21/22 1830   06/21/22 1700  Ampicillin-Sulbactam (UNASYN) 3 g in sodium chloride 0.9 % 100 mL IVPB        3 g 200 mL/hr over 30 Minutes Intravenous Every 12 hours 06/21/22 1435     06/18/22 1900  linezolid (ZYVOX) IVPB 600 mg  Status:  Discontinued        600 mg 300 mL/hr over 60 Minutes Intravenous Every 12 hours 06/18/22 1702 06/22/22 1134   06/18/22 1715  meropenem (MERREM) 1 g in sodium chloride 0.9 % 100 mL IVPB  Status:  Discontinued        1 g 200 mL/hr over 30 Minutes Intravenous Every 12 hours 06/18/22 1709 06/21/22 1411   06/18/22 0745  piperacillin-tazobactam (ZOSYN) IVPB 3.375 g        3.375 g 100 mL/hr over 30 Minutes Intravenous  Once 06/18/22 0733 06/18/22 0835        MEDICATIONS: Scheduled Meds:  acetaminophen  1,000 mg Oral Q6H   Chlorhexidine Gluconate Cloth  6 each Topical Daily   darbepoetin (ARANESP) injection - NON-DIALYSIS  100 mcg Subcutaneous Q Mon-1800   heparin injection (subcutaneous)  5,000 Units Subcutaneous Q8H   insulin aspart  0-15 Units Subcutaneous TID WC   insulin aspart  0-5 Units Subcutaneous QHS   pantoprazole (PROTONIX) IV  40 mg Intravenous QHS   sertraline  100 mg Oral QHS   sodium bicarbonate  1,300 mg Oral BID    Continuous Infusions:  ampicillin-sulbactam (UNASYN) IV 3 g (06/22/22 0600)   PRN Meds:.HYDROmorphone (DILAUDID) injection, oxyCODONE, phenol   I have personally reviewed following labs and imaging studies  LABORATORY DATA: CBC: Recent Labs  Lab 06/18/22 0750 06/18/22 1755 06/19/22 0056 06/20/22 0448 06/21/22 0531 06/22/22 0415  WBC 27.8*  --  24.0* 24.8* 35.4* 34.7*  NEUTROABS 26.7*  --   --   --   --   --   HGB 5.9* 10.9* 10.4* 9.2* 11.3* 10.9*  HCT 18.7* 32.2* 30.0* 27.6* 32.6* 32.0*  MCV 91.7  --  88.2 89.3 88.1 88.6  PLT 353  --  255 219 241 409    Basic Metabolic Panel: Recent Labs  Lab 06/19/22 0056 06/19/22 0749 06/19/22 1807 06/20/22 0448 06/20/22 1725 06/21/22 0531 06/21/22 0532 06/22/22 0415  NA 139   < > 138 134* 134*  --  134* 132*  K 4.5   < >  4.4 4.3 4.8  --  4.4 3.8  CL 112*   < > 108 107 107  --  106 104  CO2 14*   < > 14* 15* 12*  --  16* 15*  GLUCOSE 125*   < > 109* 108* 179*  --  199* 143*  BUN 64*   < > 68* 74* 76*  --  79* 87*  CREATININE 5.33*   < > 5.53* 5.55* 5.86*  --  6.03* 6.62*  CALCIUM 7.2*   < > 7.1* 6.5* 6.8*  --  7.3* 6.9*  MG 1.8  --   --  1.8  --  2.0  --   --   PHOS 6.4*  --   --  6.2*  --  7.1* 7.2* 6.6*   < > = values in this interval not displayed.    GFR: Estimated Creatinine Clearance: 12.4 mL/min (A) (by C-G formula based on SCr of 6.62 mg/dL (H)).  Liver Function Tests: Recent Labs  Lab 06/18/22 0750 06/19/22 0056 06/21/22 0532 06/22/22 0415  AST 23 39  --   --   ALT 21 24  --   --   ALKPHOS 71 65  --   --   BILITOT 0.7 1.6*  --   --   PROT 6.0* 5.7*  --   --   ALBUMIN 1.7* 1.9* 1.6* <1.5*   No results for input(s): "LIPASE", "AMYLASE" in the last 168 hours. No results for input(s): "AMMONIA" in the last 168 hours.  Coagulation Profile: No results for input(s): "INR", "PROTIME" in the last 168 hours.  Cardiac Enzymes: No results for input(s): "CKTOTAL", "CKMB", "CKMBINDEX", "TROPONINI" in the last  168 hours.  BNP (last 3 results) No results for input(s): "PROBNP" in the last 8760 hours.  Lipid Profile: No results for input(s): "CHOL", "HDL", "LDLCALC", "TRIG", "CHOLHDL", "LDLDIRECT" in the last 72 hours.  Thyroid Function Tests: No results for input(s): "TSH", "T4TOTAL", "FREET4", "T3FREE", "THYROIDAB" in the last 72 hours.  Anemia Panel: No results for input(s): "VITAMINB12", "FOLATE", "FERRITIN", "TIBC", "IRON", "RETICCTPCT" in the last 72 hours.  Urine analysis:    Component Value Date/Time   COLORURINE YELLOW 06/18/2022 0820   APPEARANCEUR HAZY (A) 06/18/2022 0820   LABSPEC 1.013 06/18/2022 0820   PHURINE 7.0 06/18/2022 0820   GLUCOSEU 50 (A) 06/18/2022 0820   HGBUR NEGATIVE 06/18/2022 0820   BILIRUBINUR NEGATIVE 06/18/2022 0820   KETONESUR NEGATIVE 06/18/2022 0820   PROTEINUR >=300 (A) 06/18/2022 0820   UROBILINOGEN 0.2 08/21/2012 2130   NITRITE NEGATIVE 06/18/2022 0820   LEUKOCYTESUR TRACE (A) 06/18/2022 0820    Sepsis Labs: Lactic Acid, Venous    Component Value Date/Time   LATICACIDVEN 2.4 (HH) 06/18/2022 1755    MICROBIOLOGY: Recent Results (from the past 240 hour(s))  Resp Panel by RT-PCR (Flu A&B, Covid) Anterior Nasal Swab     Status: None   Collection Time: 06/18/22  3:31 AM   Specimen: Anterior Nasal Swab  Result Value Ref Range Status   SARS Coronavirus 2 by RT PCR NEGATIVE NEGATIVE Final    Comment: (NOTE) SARS-CoV-2 target nucleic acids are NOT DETECTED.  The SARS-CoV-2 RNA is generally detectable in upper respiratory specimens during the acute phase of infection. The lowest concentration of SARS-CoV-2 viral copies this assay can detect is 138 copies/mL. A negative result does not preclude SARS-Cov-2 infection and should not be used as the sole basis for treatment or other patient management decisions. A negative result may occur with  improper  specimen collection/handling, submission of specimen other than nasopharyngeal swab, presence  of viral mutation(s) within the areas targeted by this assay, and inadequate number of viral copies(<138 copies/mL). A negative result must be combined with clinical observations, patient history, and epidemiological information. The expected result is Negative.  Fact Sheet for Patients:  EntrepreneurPulse.com.au  Fact Sheet for Healthcare Providers:  IncredibleEmployment.be  This test is no t yet approved or cleared by the [place] FDA and  has been authorized for detection and/or diagnosis of SARS-CoV-2 by FDA under an Emergency Use Authorization (EUA). This EUA will remain  in effect (meaning this test can be used) for the duration of the COVID-19 declaration under Section 564(b)(1) of the Act, 21 U.S.C.section 360bbb-3(b)(1), unless the authorization is terminated  or revoked sooner.       Influenza A by PCR NEGATIVE NEGATIVE Final   Influenza B by PCR NEGATIVE NEGATIVE Final    Comment: (NOTE) The Xpert Xpress SARS-CoV-2/FLU/RSV plus assay is intended as an aid in the diagnosis of influenza from Nasopharyngeal swab specimens and should not be used as a sole basis for treatment. Nasal washings and aspirates are unacceptable for Xpert Xpress SARS-CoV-2/FLU/RSV testing.  Fact Sheet for Patients: EntrepreneurPulse.com.au  Fact Sheet for Healthcare Providers: IncredibleEmployment.be  This test is not yet approved or cleared by the [place] FDA and has been authorized for detection and/or diagnosis of SARS-CoV-2 by FDA under an Emergency Use Authorization (EUA). This EUA will remain in effect (meaning this test can be used) for the duration of the COVID-19 declaration under Section 564(b)(1) of the Act, 21 U.S.C. section 360bbb-3(b)(1), unless the authorization is terminated or revoked.  Performed at [facility], [place] [address] [place], [place] [address]   Culture, blood (routine x  2)     Status: None (Preliminary result)   Collection Time: 06/18/22  7:30 AM   Specimen: BLOOD  Result Value Ref Range Status   Specimen Description BLOOD SITE NOT SPECIFIED  Final   Special Requests   Final    BOTTLES DRAWN AEROBIC AND ANAEROBIC Blood Culture results may not be optimal due to an inadequate volume of blood received in culture bottles   Culture   Final    NO GROWTH 4 DAYS Performed at [facility], [place] [address], [place], [place] [address]    Report Status PENDING  Incomplete  Culture, blood (routine x 2)     Status: None (Preliminary result)   Collection Time: 06/18/22  7:51 AM   Specimen: BLOOD  Result Value Ref Range Status   Specimen Description BLOOD SITE NOT SPECIFIED  Final   Special Requests   Final    BOTTLES DRAWN AEROBIC AND ANAEROBIC Blood Culture adequate volume   Culture   Final    NO GROWTH 4 DAYS Performed at [facility], [address], [place], [place] [address]    Report Status PENDING  Incomplete  Aerobic/Anaerobic Culture w Gram Stain (surgical/deep wound)     Status: None   Collection Time: 06/18/22  3:04 PM   Specimen: PATH Other; Body Fluid  Result Value Ref Range Status   Specimen Description FLUID RIGHT THIGH  Final   Special Requests PT ON ANCEF  Final   Gram Stain   Final    RARE WBC PRESENT, PREDOMINANTLY PMN FEW GRAM POSITIVE COCCI FEW GRAM NEGATIVE RODS Performed at [facility], [place] [address], [place], [place] [address]    Culture   Final  MODERATE STREPTOCOCCUS ANGINOSIS MIXED ANAEROBIC FLORA PRESENT.  CALL LAB IF FURTHER IID REQUIRED.    Report Status 06/21/2022 FINAL  Final   Organism ID, Bacteria STREPTOCOCCUS ANGINOSIS  Final      Susceptibility   Streptococcus anginosis - MIC*    PENICILLIN <=0.06 SENSITIVE Sensitive     CEFTRIAXONE <=0.12 SENSITIVE Sensitive     ERYTHROMYCIN 4 RESISTANT Resistant     LEVOFLOXACIN 0.5 SENSITIVE Sensitive     VANCOMYCIN 0.5 SENSITIVE Sensitive     * MODERATE  STREPTOCOCCUS ANGINOSIS  MRSA Next Gen by PCR, Nasal     Status: None   Collection Time: 06/18/22  5:55 PM   Specimen: Nasal Mucosa; Nasal Swab  Result Value Ref Range Status   MRSA by PCR Next Gen NOT DETECTED NOT DETECTED Final    Comment: (NOTE) The GeneXpert MRSA Assay (FDA approved for NASAL specimens only), is one component of a comprehensive MRSA colonization surveillance program. It is not intended to diagnose MRSA infection nor to guide or monitor treatment for MRSA infections. Test performance is not FDA approved in patients less than 17 years old. Performed at [facility], [place] [address], [place], [place] [address]     RADIOLOGY STUDIES/RESULTS: No results found.   LOS: 4 days   Oren Binet, MD  [facility]    To contact the attending provider between 7A-7P or the covering provider during after hours 7P-7A, please log into the web site [email or web address] and access using universal [place] password for that web site. If you do not have the password, please call the hospital operator.  06/22/2022, 12:22 PM

## 2022-06-23 DIAGNOSIS — E1122 Type 2 diabetes mellitus with diabetic chronic kidney disease: Secondary | ICD-10-CM | POA: Diagnosis not present

## 2022-06-23 DIAGNOSIS — N170 Acute kidney failure with tubular necrosis: Secondary | ICD-10-CM | POA: Diagnosis not present

## 2022-06-23 DIAGNOSIS — M729 Fibroblastic disorder, unspecified: Secondary | ICD-10-CM | POA: Diagnosis not present

## 2022-06-23 DIAGNOSIS — A419 Sepsis, unspecified organism: Secondary | ICD-10-CM | POA: Diagnosis not present

## 2022-06-23 LAB — GLUCOSE, CAPILLARY
Glucose-Capillary: 71 mg/dL (ref 70–99)
Glucose-Capillary: 74 mg/dL (ref 70–99)
Glucose-Capillary: 79 mg/dL (ref 70–99)
Glucose-Capillary: 81 mg/dL (ref 70–99)

## 2022-06-23 LAB — RENAL FUNCTION PANEL
Albumin: 1.5 g/dL — ABNORMAL LOW (ref 3.5–5.0)
Anion gap: 11 (ref 5–15)
BUN: 86 mg/dL — ABNORMAL HIGH (ref 6–20)
CO2: 17 mmol/L — ABNORMAL LOW (ref 22–32)
Calcium: 7.6 mg/dL — ABNORMAL LOW (ref 8.9–10.3)
Chloride: 111 mmol/L (ref 98–111)
Creatinine, Ser: 6.52 mg/dL — ABNORMAL HIGH (ref 0.44–1.00)
GFR, Estimated: 8 mL/min — ABNORMAL LOW (ref >60)
Glucose, Bld: 69 mg/dL — ABNORMAL LOW (ref 70–99)
Phosphorus: 6.1 mg/dL — ABNORMAL HIGH (ref 2.5–4.6)
Potassium: 4 mmol/L (ref 3.5–5.1)
Sodium: 139 mmol/L (ref 135–145)

## 2022-06-23 LAB — CULTURE, BLOOD (ROUTINE X 2)
Culture: NO GROWTH
Culture: NO GROWTH
Special Requests: ADEQUATE

## 2022-06-23 LAB — CBC
HCT: 31.9 % — ABNORMAL LOW (ref 36.0–46.0)
Hemoglobin: 10.5 g/dL — ABNORMAL LOW (ref 12.0–15.0)
MCH: 29.5 pg (ref 26.0–34.0)
MCHC: 32.9 g/dL (ref 30.0–36.0)
MCV: 89.6 fL (ref 80.0–100.0)
Platelets: 167 10*3/uL (ref 150–400)
RBC: 3.56 MIL/uL — ABNORMAL LOW (ref 3.87–5.11)
RDW: 14.7 % (ref 11.5–15.5)
WBC: 25 10*3/uL — ABNORMAL HIGH (ref 4.0–10.5)
nRBC: 0.2 % (ref 0.0–0.2)

## 2022-06-23 MED ORDER — CALCITRIOL 0.25 MCG PO CAPS
0.2500 ug | ORAL_CAPSULE | Freq: Every day | ORAL | Status: DC
  Administered 2022-06-23 – 2022-06-29 (×7): 0.25 ug via ORAL
  Filled 2022-06-23 (×7): qty 1

## 2022-06-23 MED ORDER — ONDANSETRON HCL 4 MG/2ML IJ SOLN
4.0000 mg | Freq: Four times a day (QID) | INTRAMUSCULAR | Status: DC | PRN
  Administered 2022-06-23 – 2022-06-28 (×5): 4 mg via INTRAVENOUS
  Filled 2022-06-23 (×5): qty 2

## 2022-06-23 MED ORDER — PANTOPRAZOLE SODIUM 40 MG PO TBEC
40.0000 mg | DELAYED_RELEASE_TABLET | Freq: Every day | ORAL | Status: DC
  Administered 2022-06-23 – 2022-06-28 (×6): 40 mg via ORAL
  Filled 2022-06-23 (×6): qty 1

## 2022-06-23 MED ORDER — TRAMADOL HCL 50 MG PO TABS
50.0000 mg | ORAL_TABLET | Freq: Two times a day (BID) | ORAL | Status: DC | PRN
  Administered 2022-06-23 – 2022-06-24 (×2): 50 mg via ORAL
  Filled 2022-06-23 (×3): qty 1

## 2022-06-23 NOTE — Progress Notes (Signed)
Subjective:  1350 UOP recorded -  crt finally seems to have stabilized  -  she is up in bedside chair maybe a little groggy due to pain and nausea meds   Objective Vital signs in last 24 hours: Vitals:   06/23/22 0400 06/23/22 0600 06/23/22 0742 06/23/22 0800  BP: 134/85  (!) 154/85 (!) 145/82  Pulse: 96 96 98 94  Resp: 17 18 19 18   Temp:   98.2 F (36.8 C)   TempSrc:   Oral   SpO2: 100% 100% 100% 100%  Weight:      Height:       Weight change:   Intake/Output Summary (Last 24 hours) at 06/23/2022 1141 Last data filed at 06/23/2022 0300 Gross per 24 hour  Intake 720 ml  Output 1350 ml  Net -630 ml     Assessment/Plan: 41 year old black female with progressive CKD as outpatient followed by [facility] with baseline creatinine in the fours.  She now presents with necrotizing fasciitis requiring incision and debridement x2.  She has had some worsening of renal function in this setting 1.Renal-baseline CKD that is been progressive as outpatient.  Presumably secondary to diabetes and hypertension.  She has had a  decrease in her GFR in the setting of necrotizing fasciitis and debridements.  Is going to be really difficult to tell if she is having symptoms related to uremia or related to her current medical situation.  She reports feeling really well a week prior to admission.  There are no absolute acute indications for dialysis at this time.  What we need to do is just follow her kidney function as well as her clinical improvement trajectory.  If she does not improve we could possibly say that uremia is the culprit.  In that case, initiation of dialysis might be warranted.  Patient seems to understand the concepts-  so far is OK -  crt a little down today !! 2. Hypertension/volume  -normally on antihypertensives as outpatient.  Right now blood pressure is fine on no meds.  Volume status seems may be just a little heavy but close to euvolemic.  No recommendations in that department 3.   Necrotizing fasciitis-status post debridements x2, per surgery-also on antibiotics 4.  Metabolic acidosis-  oral serum bicarbonate 5. Anemia  -due to CKD and surgery-we will give ESA but hold off on iron right now 6.  Bones-PTH 415-  will start some calcitriol-  Phos high-  keep in mind but wont start binders right now due to GI issues -  trending down     [facility]    Labs: Basic Metabolic Panel: Recent Labs  Lab 06/21/22 0532 06/22/22 0415 06/23/22 0811  NA 134* 132* 139  K 4.4 3.8 4.0  CL 106 104 111  CO2 16* 15* 17*  GLUCOSE 199* 143* 69*  BUN 79* 87* 86*  CREATININE 6.03* 6.62* 6.52*  CALCIUM 7.3* 6.9* 7.6*  PHOS 7.2* 6.6* 6.1*   Liver Function Tests: Recent Labs  Lab 06/18/22 0750 06/19/22 0056 06/21/22 0532 06/22/22 0415 06/23/22 0811  AST 23 39  --   --   --   ALT 21 24  --   --   --   ALKPHOS 71 65  --   --   --   BILITOT 0.7 1.6*  --   --   --   PROT 6.0* 5.7*  --   --   --   ALBUMIN 1.7* 1.9* 1.6* <1.5* 1.5*  No results for input(s): "LIPASE", "AMYLASE" in the last 168 hours. No results for input(s): "AMMONIA" in the last 168 hours. CBC: Recent Labs  Lab 06/18/22 0750 06/18/22 1755 06/19/22 0056 06/20/22 0448 06/21/22 0531 06/22/22 0415 06/23/22 0811  WBC 27.8*  --  24.0* 24.8* 35.4* 34.7* 25.0*  NEUTROABS 26.7*  --   --   --   --   --   --   HGB 5.9*   < > 10.4* 9.2* 11.3* 10.9* 10.5*  HCT 18.7*   < > 30.0* 27.6* 32.6* 32.0* 31.9*  MCV 91.7  --  88.2 89.3 88.1 88.6 89.6  PLT 353  --  255 219 241 198 167   < > = values in this interval not displayed.   Cardiac Enzymes: No results for input(s): "CKTOTAL", "CKMB", "CKMBINDEX", "TROPONINI" in the last 168 hours. CBG: Recent Labs  Lab 06/22/22 0806 06/22/22 1253 06/22/22 1546 06/22/22 2151 06/23/22 0741  GLUCAP 163* 120* 149* 75 71    Iron Studies: No results for input(s): "IRON", "TIBC", "TRANSFERRIN", "FERRITIN" in the last 72 hours. Studies/Results: No results  found. Medications: Infusions:    Scheduled Medications:  (feeding supplement) PROSource Plus  30 mL Oral BID BM   acetaminophen  1,000 mg Oral Q6H   amoxicillin-clavulanate  1 tablet Oral QHS   Chlorhexidine Gluconate Cloth  6 each Topical Daily   darbepoetin (ARANESP) injection - NON-DIALYSIS  100 mcg Subcutaneous Q Mon-1800   feeding supplement  237 mL Oral BID BM   heparin injection (subcutaneous)  5,000 Units Subcutaneous Q8H   insulin aspart  0-15 Units Subcutaneous TID WC   insulin aspart  0-5 Units Subcutaneous QHS   pantoprazole (PROTONIX) IV  40 mg Intravenous QHS   sertraline  100 mg Oral QHS   sodium bicarbonate  1,300 mg Oral BID    have reviewed scheduled and prn medications.  Physical Exam: General: alert, NAD Heart: RRR Lungs: mostly clear Abdomen: obese, post op Extremities: trace edema    06/23/2022,11:41 AM  LOS: 5 days

## 2022-06-23 NOTE — Progress Notes (Signed)
PROGRESS NOTE        PATIENT DETAILS Name: Jacqueline [name] Last Age: 40 y.o. Sex: female Date of Birth: 04-Aug-1980 Admit Date: 06/18/2022 Admitting Physician Arta Bruce Kinsinger, MD KGU:[id number], Larey Dresser, MD  Brief Summary: Patient is a 41 y.o.  female with history of CKD stage IV, DM-2, HTN, s/p gastric bypass 2020-presenting with severe sepsis due to necrotizing fasciitis of right thigh/groin-evaluated by orthopedics/general surgery-and underwent right thigh I&D/debridement-and was subsequently admitted by PCCM postoperatively.  She was stabilized and subsequently transferred to [facility] on 12/5.  Significant events: 12/2>> presenting with right thigh necrotizing fasciitis-s/p debridement by Ortho/general surgery-admit to ICU postoperatively. 12/4>> irrigation/debridement by general surgery 12/5>> transferred to [facility].  Significant studies: 12/2>> CT abdomen/pelvis: Findings concerning for necrotizing fasciitis in the right thigh.  Probable right psoas muscle abscess. 12/2>> CT of the right lower extremity: Necrotizing fasciitis/infectious myositis of right thigh.  Significant microbiology data: 12/2>> COVID/influenza PCR: Negative 12/2>> right thigh tissue: Streptococcus anginosus/mixed anaerobic flora. 12/2>> blood culture: No growth  Procedures: 12/2>> right thigh I&D by Ortho, advised pelvis I&D by general surgery 12/4>> right thigh/groin irrigation and debridement  Consults: Orthopedics General surgery Infectious disease Nephrology PCCM  Subjective: Continues to have some pain at the operative site-unfortunately gets nauseous with oxycodone.  Does not get nauseous with IV narcotics.  Objective: Vitals: Blood pressure (!) 145/82, pulse 94, temperature 98.2 F (36.8 C), temperature source Oral, resp. rate 18, height 5\' 5"  (1.651 m), weight 91.5 kg, last menstrual period 06/15/2022, SpO2 100 %.   Exam: Gen Exam:Alert awake-not in any  distress HEENT:atraumatic, normocephalic Chest: B/L clear to auscultation anteriorly CVS:S1S2 regular Abdomen:soft non tender, non distended Extremities:no edema Neurology: Non focal Skin: no rash  Pertinent Labs/Radiology:    Latest Ref Rng & Units 06/23/2022    8:11 AM 06/22/2022    4:15 AM 06/21/2022    5:31 AM  CBC  WBC 4.0 - 10.5 K/uL 25.0  34.7  35.4   Hemoglobin 12.0 - 15.0 g/dL 10.5  10.9  11.3   Hematocrit 36.0 - 46.0 % 31.9  32.0  32.6   Platelets 150 - 400 K/uL 167  198  241     Lab Results  Component Value Date   NA 139 06/23/2022   K 4.0 06/23/2022   CL 111 06/23/2022   CO2 17 (L) 06/23/2022      Assessment/Plan: Severe sepsis due to necrotizing fasciitis involving right thigh/pelvis Sepsis physiology has resolved-s/p I&D on 12/2 and 12/4 Leukocytosis downtrending-per general surgery-wound looks clean without any evidence of reinfection.   Has been transitioned to Augmentin by infectious disease.   Optimize pain control-gets nauseous with oral oxycodone-will try tramadol.  Continue with as needed IV Dilaudid for dressing changes/pain control as well.  AKI on CKD 4 AKI-likely due to ischemic ATN in the setting of sepsis physiology Nephrology following-we will defer further to nephrology service. Will discuss with nephrology to see if Foley catheter can be discontinued.  Hyperkalemia Resolved  Metabolic acidosis Due to CKD On oral bicarb supplementation  Normocytic anemia Due to a combination of anemia due to CKD and acute illness. Darbepoetin/iron defer to nephrology service Follow CBC  HTN BP currently stable All antihypertensives on hold-resume when able  DM-2 (A1c 5.4 on 12/5) with hyperglycemia Not on any medications at home Stable on SSI currently  Recent Labs  06/22/22 1546 06/22/22 2151 06/23/22 0741  GLUCAP 149* 75 71      Hyponatremia Mild Follow-up for now  Depression Stable Zoloft  BMI: Estimated body mass index is  33.57 kg/m as calculated from the following:   Height as of this encounter: 5\' 5"  (1.651 m).   Weight as of this encounter: 91.5 kg.   Code status:   Code Status: Prior   DVT Prophylaxis: heparin injection 5,000 Units Start: 06/18/22 1715   Family Communication: Spouse at bedside   Disposition Plan: Status is: Inpatient Remains inpatient appropriate because: Severity of illness   Planned Discharge Destination: Home health versus SNF   Diet: Diet Order             Diet regular Room service appropriate? Yes; Fluid consistency: Thin  Diet effective now                     Antimicrobial agents: Anti-infectives (From admission, onward)    Start     Dose/Rate Route Frequency Ordered Stop   06/22/22 2200  amoxicillin-clavulanate (AUGMENTIN) 500-125 MG per tablet 1 tablet        1 tablet Oral Daily at bedtime 06/22/22 1356 06/29/22 2159   06/22/22 0600  ceFAZolin (ANCEF) IVPB 2g/100 mL premix  Status:  Discontinued        2 g 200 mL/hr over 30 Minutes Intravenous On call to O.R. 06/21/22 1829 06/21/22 1830   06/21/22 1700  Ampicillin-Sulbactam (UNASYN) 3 g in sodium chloride 0.9 % 100 mL IVPB  Status:  Discontinued        3 g 200 mL/hr over 30 Minutes Intravenous Every 12 hours 06/21/22 1435 06/22/22 1356   06/18/22 1900  linezolid (ZYVOX) IVPB 600 mg  Status:  Discontinued        600 mg 300 mL/hr over 60 Minutes Intravenous Every 12 hours 06/18/22 1702 06/22/22 1134   06/18/22 1715  meropenem (MERREM) 1 g in sodium chloride 0.9 % 100 mL IVPB  Status:  Discontinued        1 g 200 mL/hr over 30 Minutes Intravenous Every 12 hours 06/18/22 1709 06/21/22 1411   06/18/22 0745  piperacillin-tazobactam (ZOSYN) IVPB 3.375 g        3.375 g 100 mL/hr over 30 Minutes Intravenous  Once 06/18/22 0733 06/18/22 0835        MEDICATIONS: Scheduled Meds:  (feeding supplement) PROSource Plus  30 mL Oral BID BM   acetaminophen  1,000 mg Oral Q6H   amoxicillin-clavulanate  1  tablet Oral QHS   Chlorhexidine Gluconate Cloth  6 each Topical Daily   darbepoetin (ARANESP) injection - NON-DIALYSIS  100 mcg Subcutaneous Q Mon-1800   feeding supplement  237 mL Oral BID BM   heparin injection (subcutaneous)  5,000 Units Subcutaneous Q8H   insulin aspart  0-15 Units Subcutaneous TID WC   insulin aspart  0-5 Units Subcutaneous QHS   pantoprazole (PROTONIX) IV  40 mg Intravenous QHS   sertraline  100 mg Oral QHS   sodium bicarbonate  1,300 mg Oral BID   Continuous Infusions:   PRN Meds:.HYDROmorphone (DILAUDID) injection, ondansetron (ZOFRAN) IV, phenol   I have personally reviewed following labs and imaging studies  LABORATORY DATA: CBC: Recent Labs  Lab 06/18/22 0750 06/18/22 1755 06/19/22 0056 06/20/22 0448 06/21/22 0531 06/22/22 0415 06/23/22 0811  WBC 27.8*  --  24.0* 24.8* 35.4* 34.7* 25.0*  NEUTROABS 26.7*  --   --   --   --   --   --  HGB 5.9*   < > 10.4* 9.2* 11.3* 10.9* 10.5*  HCT 18.7*   < > 30.0* 27.6* 32.6* 32.0* 31.9*  MCV 91.7  --  88.2 89.3 88.1 88.6 89.6  PLT 353  --  255 219 241 198 167   < > = values in this interval not displayed.     Basic Metabolic Panel: Recent Labs  Lab 06/19/22 0056 06/19/22 0749 06/20/22 0448 06/20/22 1725 06/21/22 0531 06/21/22 0532 06/22/22 0415 06/23/22 0811  NA 139   < > 134* 134*  --  134* 132* 139  K 4.5   < > 4.3 4.8  --  4.4 3.8 4.0  CL 112*   < > 107 107  --  106 104 111  CO2 14*   < > 15* 12*  --  16* 15* 17*  GLUCOSE 125*   < > 108* 179*  --  199* 143* 69*  BUN 64*   < > 74* 76*  --  79* 87* 86*  CREATININE 5.33*   < > 5.55* 5.86*  --  6.03* 6.62* 6.52*  CALCIUM 7.2*   < > 6.5* 6.8*  --  7.3* 6.9* 7.6*  MG 1.8  --  1.8  --  2.0  --   --   --   PHOS 6.4*  --  6.2*  --  7.1* 7.2* 6.6* 6.1*   < > = values in this interval not displayed.     GFR: Estimated Creatinine Clearance: 12.6 mL/min (A) (by C-G formula based on SCr of 6.52 mg/dL (H)).  Liver Function Tests: Recent Labs   Lab 06/18/22 0750 06/19/22 0056 06/21/22 0532 06/22/22 0415 06/23/22 0811  AST 23 39  --   --   --   ALT 21 24  --   --   --   ALKPHOS 71 65  --   --   --   BILITOT 0.7 1.6*  --   --   --   PROT 6.0* 5.7*  --   --   --   ALBUMIN 1.7* 1.9* 1.6* <1.5* 1.5*    No results for input(s): "LIPASE", "AMYLASE" in the last 168 hours. No results for input(s): "AMMONIA" in the last 168 hours.  Coagulation Profile: No results for input(s): "INR", "PROTIME" in the last 168 hours.  Cardiac Enzymes: No results for input(s): "CKTOTAL", "CKMB", "CKMBINDEX", "TROPONINI" in the last 168 hours.  BNP (last 3 results) No results for input(s): "PROBNP" in the last 8760 hours.  Lipid Profile: No results for input(s): "CHOL", "HDL", "LDLCALC", "TRIG", "CHOLHDL", "LDLDIRECT" in the last 72 hours.  Thyroid Function Tests: No results for input(s): "TSH", "T4TOTAL", "FREET4", "T3FREE", "THYROIDAB" in the last 72 hours.  Anemia Panel: No results for input(s): "VITAMINB12", "FOLATE", "FERRITIN", "TIBC", "IRON", "RETICCTPCT" in the last 72 hours.  Urine analysis:    Component Value Date/Time   COLORURINE YELLOW 06/18/2022 0820   APPEARANCEUR HAZY (A) 06/18/2022 0820   LABSPEC 1.013 06/18/2022 0820   PHURINE 7.0 06/18/2022 0820   GLUCOSEU 50 (A) 06/18/2022 0820   HGBUR NEGATIVE 06/18/2022 0820   BILIRUBINUR NEGATIVE 06/18/2022 0820   KETONESUR NEGATIVE 06/18/2022 0820   PROTEINUR >=300 (A) 06/18/2022 0820   UROBILINOGEN 0.2 08/21/2012 2130   NITRITE NEGATIVE 06/18/2022 0820   LEUKOCYTESUR TRACE (A) 06/18/2022 0820    Sepsis Labs: Lactic Acid, Venous    Component Value Date/Time   LATICACIDVEN 2.4 ([place]) 06/18/2022 1755    MICROBIOLOGY: Recent Results (from the past 240 hour(s))  Resp Panel by RT-PCR (Flu A&B, Covid) Anterior Nasal Swab     Status: None   Collection Time: 06/18/22  3:31 AM   Specimen: Anterior Nasal Swab  Result Value Ref Range Status   SARS Coronavirus 2 by RT PCR  NEGATIVE NEGATIVE Final    Comment: (NOTE) SARS-CoV-2 target nucleic acids are NOT DETECTED.  The SARS-CoV-2 RNA is generally detectable in upper respiratory specimens during the acute phase of infection. The lowest concentration of SARS-CoV-2 viral copies this assay can detect is 138 copies/mL. A negative result does not preclude SARS-Cov-2 infection and should not be used as the sole basis for treatment or other patient management decisions. A negative result may occur with  improper specimen collection/handling, submission of specimen other than nasopharyngeal swab, presence of viral mutation(s) within the areas targeted by this assay, and inadequate number of viral copies(<138 copies/mL). A negative result must be combined with clinical observations, patient history, and epidemiological information. The expected result is Negative.  Fact Sheet for Patients:  EntrepreneurPulse.com.au  Fact Sheet for Healthcare Providers:  IncredibleEmployment.be  This test is no t yet approved or cleared by the [place] FDA and  has been authorized for detection and/or diagnosis of SARS-CoV-2 by FDA under an Emergency Use Authorization (EUA). This EUA will remain  in effect (meaning this test can be used) for the duration of the COVID-19 declaration under Section 564(b)(1) of the Act, 21 U.S.C.section 360bbb-3(b)(1), unless the authorization is terminated  or revoked sooner.       Influenza A by PCR NEGATIVE NEGATIVE Final   Influenza B by PCR NEGATIVE NEGATIVE Final    Comment: (NOTE) The Xpert Xpress SARS-CoV-2/FLU/RSV plus assay is intended as an aid in the diagnosis of influenza from Nasopharyngeal swab specimens and should not be used as a sole basis for treatment. Nasal washings and aspirates are unacceptable for Xpert Xpress SARS-CoV-2/FLU/RSV testing.  Fact Sheet for Patients: EntrepreneurPulse.com.au  Fact Sheet for  Healthcare Providers: IncredibleEmployment.be  This test is not yet approved or cleared by the [place] FDA and has been authorized for detection and/or diagnosis of SARS-CoV-2 by FDA under an Emergency Use Authorization (EUA). This EUA will remain in effect (meaning this test can be used) for the duration of the COVID-19 declaration under Section 564(b)(1) of the Act, 21 U.S.C. section 360bbb-3(b)(1), unless the authorization is terminated or revoked.  Performed at [facility], [place] [address] [place], [place] [address]   Culture, blood (routine x 2)     Status: None   Collection Time: 06/18/22  7:30 AM   Specimen: BLOOD  Result Value Ref Range Status   Specimen Description BLOOD SITE NOT SPECIFIED  Final   Special Requests   Final    BOTTLES DRAWN AEROBIC AND ANAEROBIC Blood Culture results may not be optimal due to an inadequate volume of blood received in culture bottles   Culture   Final    NO GROWTH 5 DAYS Performed at [facility], [place] [address], [place], [place] [address]    Report Status 06/23/2022 FINAL  Final  Culture, blood (routine x 2)     Status: None   Collection Time: 06/18/22  7:51 AM   Specimen: BLOOD  Result Value Ref Range Status   Specimen Description BLOOD SITE NOT SPECIFIED  Final   Special Requests   Final    BOTTLES DRAWN AEROBIC AND ANAEROBIC Blood Culture adequate volume   Culture   Final    NO GROWTH  5 DAYS Performed at [facility], [place] [address] [place], [place] [address]    Report Status 06/23/2022 FINAL  Final  Aerobic/Anaerobic Culture w Gram Stain (surgical/deep wound)     Status: None   Collection Time: 06/18/22  3:04 PM   Specimen: PATH Other; Body Fluid  Result Value Ref Range Status   Specimen Description FLUID RIGHT THIGH  Final   Special Requests PT ON ANCEF  Final   Gram Stain   Final    RARE WBC PRESENT, PREDOMINANTLY PMN FEW GRAM POSITIVE COCCI FEW GRAM NEGATIVE RODS Performed at  [facility], [place] [address], [place], [place] [address]    Culture   Final    MODERATE STREPTOCOCCUS ANGINOSIS MIXED ANAEROBIC FLORA PRESENT.  CALL LAB IF FURTHER IID REQUIRED.    Report Status 06/21/2022 FINAL  Final   Organism ID, Bacteria STREPTOCOCCUS ANGINOSIS  Final      Susceptibility   Streptococcus anginosis - MIC*    PENICILLIN <=0.06 SENSITIVE Sensitive     CEFTRIAXONE <=0.12 SENSITIVE Sensitive     ERYTHROMYCIN 4 RESISTANT Resistant     LEVOFLOXACIN 0.5 SENSITIVE Sensitive     VANCOMYCIN 0.5 SENSITIVE Sensitive     * MODERATE STREPTOCOCCUS ANGINOSIS  MRSA Next Gen by PCR, Nasal     Status: None   Collection Time: 06/18/22  5:55 PM   Specimen: Nasal Mucosa; Nasal Swab  Result Value Ref Range Status   MRSA by PCR Next Gen NOT DETECTED NOT DETECTED Final    Comment: (NOTE) The GeneXpert MRSA Assay (FDA approved for NASAL specimens only), is one component of a comprehensive MRSA colonization surveillance program. It is not intended to diagnose MRSA infection nor to guide or monitor treatment for MRSA infections. Test performance is not FDA approved in patients less than 31 years old. Performed at [facility], [place] [address] [place], [place] [address]     RADIOLOGY STUDIES/RESULTS: No results found.   LOS: 5 days   Oren Binet, MD  [facility]    To contact the attending provider between 7A-7P or the covering provider during after hours 7P-7A, please log into the web site [email or web address] and access using universal [place] password for that web site. If you do not have the password, please call the hospital operator.  06/23/2022, 11:45 AM

## 2022-06-23 NOTE — Progress Notes (Signed)
Physical Therapy Treatment Patient Details Name: Jacqueline [name] MRN: [id number] DOB: 1981-01-02 Today's Date: 06/23/2022   History of Present Illness Patient is 41 y.o. female admitted for Rt psoas abscess and pelvic necrotizing fasciitis extendign to Rt thigh. Now s/p excisional debridement of skin, muscle (iliacus, psoas, transversus abdominis), fascia of pelvis, abdominal wall and retroperitoneum dissection on 12/2.    PT Comments    Pt on bedside commode upon PT arrival to room, reports feeling fatigued but ready to get back to bed. Pt overall transferring at min assist level at this time for steadying and initial rise from sitting position, pt declines gait training secondary to fatigue. PT to continue to follow and hopeful to progress mobility as pt tolerance allows.      Recommendations for follow up therapy are one component of a multi-disciplinary discharge planning process, led by the attending physician.  Recommendations may be updated based on patient status, additional functional criteria and insurance authorization.  Follow Up Recommendations  Home health PT     Assistance Recommended at Discharge Frequent or constant Supervision/Assistance  Patient can return home with the following A lot of help with walking and/or transfers;A lot of help with bathing/dressing/bathroom;Assistance with cooking/housework;Direct supervision/assist for medications management;Assist for transportation;Help with stairs or ramp for entrance   Equipment Recommendations  Rolling walker (2 wheels);Wheelchair (measurements PT);Wheelchair cushion (measurements PT)    Recommendations for Other Services       Precautions / Restrictions Precautions Precautions: Fall Precaution Comments: L knee buckle, watch BP Restrictions Weight Bearing Restrictions: No RLE Weight Bearing: Weight bearing as tolerated     Mobility  Bed Mobility Overal bed mobility: Needs Assistance Bed Mobility: Sit to Supine        Sit to supine: Mod assist, HOB elevated   General bed mobility comments: assist for LE lift into bed, pt able to scoot self up in bed    Transfers Overall transfer level: Needs assistance Equipment used: Rolling walker (2 wheels) Transfers: Sit to/from Stand, Bed to chair/wheelchair/BSC Sit to Stand: Min assist   Step pivot transfers: Min assist       General transfer comment: assist for power up, steadying, close RLE guarding given history of buckling today.    Ambulation/Gait               General Gait Details: pt declines given fatigue   Stairs             Wheelchair Mobility    Modified Rankin (Stroke Patients Only)       Balance Overall balance assessment: Needs assistance Sitting-balance support: Feet supported, Bilateral upper extremity supported Sitting balance-Leahy Scale: Good     Standing balance support: During functional activity, Reliant on assistive device for balance, Bilateral upper extremity supported Standing balance-Leahy Scale: Poor                              Cognition Arousal/Alertness: Awake/alert Behavior During Therapy: WFL for tasks assessed/performed Overall Cognitive Status: Within Functional Limits for tasks assessed                                          Exercises General Exercises - Lower Extremity Straight Leg Raises: AAROM, Both, 5 reps, Supine    General Comments General comments (skin integrity, edema, etc.): vss, RLE wound dressing soaked through in yellow-tinged  fluid to level of gown, RN in room to change gown and dressing      Pertinent Vitals/Pain Pain Assessment Pain Assessment: Faces Faces Pain Scale: Hurts little more Pain Location: Rt thigh Pain Descriptors / Indicators: Discomfort Pain Intervention(s): Limited activity within patient's tolerance, Monitored during session, Repositioned    Home Living                          Prior Function             PT Goals (current goals can now be found in the care plan section) Acute Rehab PT Goals Patient Stated Goal: get stronger, able to return home PT Goal Formulation: With patient Time For Goal Achievement: 07/06/22 Potential to Achieve Goals: Good Progress towards PT goals: Progressing toward goals    Frequency    Min 4X/week      PT Plan Current plan remains appropriate    Co-evaluation              AM-PAC PT "6 Clicks" Mobility   Outcome Measure  Help needed turning from your back to your side while in a flat bed without using bedrails?: A Little Help needed moving from lying on your back to sitting on the side of a flat bed without using bedrails?: A Little Help needed moving to and from a bed to a chair (including a wheelchair)?: A Lot Help needed standing up from a chair using your arms (e.g., wheelchair or bedside chair)?: A Lot Help needed to walk in hospital room?: A Lot Help needed climbing 3-5 steps with a railing? : A Lot 6 Click Score: 14    End of Session Equipment Utilized During Treatment: Gait belt Activity Tolerance: Patient limited by fatigue Patient left: in bed;with call bell/phone within reach;with bed alarm set;with nursing/sitter in room Nurse Communication: Mobility status PT Visit Diagnosis: Other abnormalities of gait and mobility (R26.89);Muscle weakness (generalized) (M62.81);Difficulty in walking, not elsewhere classified (R26.2)     Time: 4827-0786 PT Time Calculation (min) (ACUTE ONLY): 11 min  Charges:  $Therapeutic Activity: 8-22 mins                     Stacie Glaze, PT DPT Acute Rehabilitation Services Pager [phone]  Office [phone]    Louis Matte 06/23/2022, 4:38 PM

## 2022-06-23 NOTE — Progress Notes (Signed)
The patient is receiving Protonix by the intravenous route.  Based on criteria approved by the Pharmacy and Therapeutics Committee and the Medical Executive Committee, the medication is being converted to the equivalent oral dose form.  These criteria include: -No active GI bleeding -Able to tolerate diet of full liquids (or better) or tube feeding -Able to tolerate other medications by the oral or enteral route  If you have any questions about this conversion, please contact the Pharmacy Department.    Thank you.  Kimberley Speece, RPh Clinical Pharmacist Please check AMION for all [facility] Pharmacy phone numbers After 10:00 PM, call Main Pharmacy [phone]  

## 2022-06-23 NOTE — Evaluation (Signed)
Occupational Therapy Evaluation Patient Details Name: Jacqueline [name] MRN: [id number] DOB: May 09, 1981 Today's Date: 06/23/2022   History of Present Illness Patient is 41 y.o. female admitted for Rt psoas abscess and pelvic necrotizing fasciitis extendign to Rt thigh. Now s/p excisional debridement of skin, muscle (iliacus, psoas, transversus abdominis), fascia of pelvis, abdominal wall and retroperitoneum dissection on 12/2.   Clinical Impression   Patient admitted for the diagnosis above.  PTA she lives with her spouse and children, continues to work full time, and needed no assist with any aspect of ADL, iADL, or mobility.  Deficits impacting independence are listed below.  Currently she is needing up to Mod A for sit to stand, and lower body ADL.   Patient will hopefully progress quickly.  No post acute OT is anticipated, but will depend on progress.  Of note, patient's R knee buckled, and she was lowered to the floor.  OT placed safety zone, and notified RN.  Patient's BP also dropped from 145 to 121.       Recommendations for follow up therapy are one component of a multi-disciplinary discharge planning process, led by the attending physician.  Recommendations may be updated based on patient status, additional functional criteria and insurance authorization.   Follow Up Recommendations  No OT follow up     Assistance Recommended at Discharge Intermittent Supervision/Assistance  Patient can return home with the following Assist for transportation;Assistance with cooking/housework;A lot of help with walking and/or transfers;A little help with bathing/dressing/bathroom    Functional Status Assessment  Patient has had a recent decline in their functional status and demonstrates the ability to make significant improvements in function in a reasonable and predictable amount of time.  Equipment Recommendations  BSC/3in1    Recommendations for Other Services       Precautions / Restrictions  Precautions Precautions: Fall Precaution Comments: L knee buckle, watch BP Restrictions Weight Bearing Restrictions: No RLE Weight Bearing: Weight bearing as tolerated      Mobility Bed Mobility Overal bed mobility: Needs Assistance Bed Mobility: Supine to Sit     Supine to sit: HOB elevated, Min assist          Transfers Overall transfer level: Needs assistance Equipment used: Rolling walker (2 wheels) Transfers: Sit to/from Stand, Bed to chair/wheelchair/BSC Sit to Stand: Mod assist     Step pivot transfers: Min assist            Balance Overall balance assessment: Needs assistance Sitting-balance support: Feet supported, Bilateral upper extremity supported Sitting balance-Leahy Scale: Good     Standing balance support: During functional activity, Reliant on assistive device for balance, Bilateral upper extremity supported Standing balance-Leahy Scale: Poor                             ADL either performed or assessed with clinical judgement   ADL       Grooming: Wash/dry hands;Wash/dry face;Set up;Standing;Oral care               Lower Body Dressing: Supervision/safety;Sit to/from stand   Toilet Transfer: Supervision/safety;Rolling walker (2 wheels);Ambulation                   Vision Baseline Vision/History: 1 Wears glasses Patient Visual Report: No change from baseline       Perception     Praxis      Pertinent Vitals/Pain Pain Assessment Faces Pain Scale: Hurts a little bit Pain Location: Rt  thigh Pain Descriptors / Indicators: Discomfort Pain Intervention(s): Monitored during session     Hand Dominance Right   Extremity/Trunk Assessment             Communication Communication Communication: No difficulties   Cognition Arousal/Alertness: Awake/alert Behavior During Therapy: WFL for tasks assessed/performed Overall Cognitive Status: Within Functional Limits for tasks assessed                                        General Comments   Watch BP    Exercises     Shoulder Instructions      Home Living Family/patient expects to be discharged to:: Private residence Living Arrangements: Spouse/significant other;Children Available Help at Discharge: Family Type of Home: House Home Access: Level entry;Stairs to enter Entrance Stairs-Number of Steps: 1 stoop Entrance Stairs-Rails: None Home Layout: Two level;Able to live on main level with bedroom/bathroom;Bed/bath upstairs Alternate Level Stairs-Number of Steps: flight Alternate Level Stairs-Rails: Right Bathroom Shower/Tub: Tub/shower unit;Walk-in shower   Bathroom Toilet: Standard Bathroom Accessibility: Yes How Accessible: Accessible via walker Home Equipment: None   Additional Comments: pt lives with husband and 2 children (76 y.o. and 72 y.o.) her mother will come assist her at home as needed.      Prior Functioning/Environment Prior Level of Function : Independent/Modified Independent;Working/employed;Driving                        OT Problem List: Decreased strength;Decreased activity tolerance;Impaired balance (sitting and/or standing);Decreased knowledge of use of DME or AE      OT Treatment/Interventions: Self-care/ADL training;Therapeutic activities;DME and/or AE instruction;Balance training    OT Goals(Current goals can be found in the care plan section) Acute Rehab OT Goals Patient Stated Goal: return home OT Goal Formulation: With patient Time For Goal Achievement: 07/07/22 Potential to Achieve Goals: Good  OT Frequency: Min 2X/week    Co-evaluation              AM-PAC OT "6 Clicks" Daily Activity     Outcome Measure Help from another person eating meals?: None Help from another person taking care of personal grooming?: A Little Help from another person toileting, which includes using toliet, bedpan, or urinal?: A Lot Help from another person bathing (including washing,  rinsing, drying)?: A Lot Help from another person to put on and taking off regular upper body clothing?: None Help from another person to put on and taking off regular lower body clothing?: A Lot 6 Click Score: 17   End of Session Equipment Utilized During Treatment: Gait belt;Rolling walker (2 wheels) Nurse Communication: Mobility status  Activity Tolerance: Patient tolerated treatment well Patient left: in chair;with call bell/phone within reach;with chair alarm set;with family/visitor present  OT Visit Diagnosis: Unsteadiness on feet (R26.81);Muscle weakness (generalized) (M62.81)                Time: 1030-1055 OT Time Calculation (min): 25 min Charges:  OT General Charges $OT Visit: 1 Visit OT Evaluation $OT Eval Moderate Complexity: 1 Mod OT Treatments $Therapeutic Activity: 8-22 mins  06/23/2022  RP, OTR/L  Acute Rehabilitation Services  Office:  [place] 06/23/2022, 11:02 AM

## 2022-06-23 NOTE — Progress Notes (Signed)
3 Days Post-Op  Subjective: CC: Denies pain. Trying to eat breakfast but reports some trouble eating due to throat pain as well as nausea. No vomiting today so far.   Patients mother is at bedside, plans to help with wound care once pt discharged  Objective: Vital signs in last 24 hours: Temp:  [98.1 F (36.7 C)-98.2 F (36.8 C)] 98.2 F (36.8 C) (12/07 0742) Pulse Rate:  [90-100] 94 (12/07 0800) Resp:  [16-20] 18 (12/07 0800) BP: (134-154)/(82-86) 145/82 (12/07 0800) SpO2:  [99 %-100 %] 100 % (12/07 0800) Last BM Date : 06/22/22  Intake/Output from previous day: 12/06 0701 - 12/07 0700 In: 720 [P.O.:720] Out: 1350 [Urine:1350] Intake/Output this shift: No intake/output data recorded.  PE: Gen:  Alert, NAD, pleasant Card:  RRR Pulm: Rate and effort normal Abd: Soft, ND, NT Wound: dressing c/d/I Ext: RLE. SILT to light touch for the RLE but decreased medial > lateral to wound of upper thigh. She reports her SILT and equal to LLE for the lower leg. Able to demonstrate dorsiflexion/plantar flexion of the RLE. DP 2+.  Psych: A&Ox3   Lab Results:  Recent Labs    06/22/22 0415 06/23/22 0811  WBC 34.7* 25.0*  HGB 10.9* 10.5*  HCT 32.0* 31.9*  PLT 198 167   BMET Recent Labs    06/21/22 0532 06/22/22 0415  NA 134* 132*  K 4.4 3.8  CL 106 104  CO2 16* 15*  GLUCOSE 199* 143*  BUN 79* 87*  CREATININE 6.03* 6.62*  CALCIUM 7.3* 6.9*   PT/INR No results for input(s): "LABPROT", "INR" in the last 72 hours. CMP     Component Value Date/Time   NA 132 (L) 06/22/2022 0415   K 3.8 06/22/2022 0415   CL 104 06/22/2022 0415   CO2 15 (L) 06/22/2022 0415   GLUCOSE 143 (H) 06/22/2022 0415   BUN 87 (H) 06/22/2022 0415   CREATININE 6.62 (H) 06/22/2022 0415   CALCIUM 6.9 (L) 06/22/2022 0415   PROT 5.7 (L) 06/19/2022 0056   ALBUMIN <1.5 (L) 06/22/2022 0415   AST 39 06/19/2022 0056   ALT 24 06/19/2022 0056   ALKPHOS 65 06/19/2022 0056   BILITOT 1.6 (H) 06/19/2022  0056   GFRNONAA 7 (L) 06/22/2022 0415   GFRAA 31 (L) 12/03/2019 0345   Lipase     Component Value Date/Time   LIPASE 40 08/27/2021 0811    Studies/Results: No results found.  Anti-infectives: Anti-infectives (From admission, onward)    Start     Dose/Rate Route Frequency Ordered Stop   06/22/22 2200  amoxicillin-clavulanate (AUGMENTIN) 500-125 MG per tablet 1 tablet        1 tablet Oral Daily at bedtime 06/22/22 1356 06/29/22 2159   06/22/22 0600  ceFAZolin (ANCEF) IVPB 2g/100 mL premix  Status:  Discontinued        2 g 200 mL/hr over 30 Minutes Intravenous On call to O.R. 06/21/22 1829 06/21/22 1830   06/21/22 1700  Ampicillin-Sulbactam (UNASYN) 3 g in sodium chloride 0.9 % 100 mL IVPB  Status:  Discontinued        3 g 200 mL/hr over 30 Minutes Intravenous Every 12 hours 06/21/22 1435 06/22/22 1356   06/18/22 1900  linezolid (ZYVOX) IVPB 600 mg  Status:  Discontinued        600 mg 300 mL/hr over 60 Minutes Intravenous Every 12 hours 06/18/22 1702 06/22/22 1134   06/18/22 1715  meropenem (MERREM) 1 g in sodium chloride 0.9 % 100  mL IVPB  Status:  Discontinued        1 g 200 mL/hr over 30 Minutes Intravenous Every 12 hours 06/18/22 1709 06/21/22 1411   06/18/22 0745  piperacillin-tazobactam (ZOSYN) IVPB 3.375 g        3.375 g 100 mL/hr over 30 Minutes Intravenous  Once 06/18/22 0733 06/18/22 0835        Assessment/Plan POD 5/3, s/p excisional debridement of NSTI of right groin/thigh, Dr. Toy Cookey, Dr. Mable Fill (ortho) - Cx w/ moderate strep anginosis w/ mixed anaerobic flora present. Cont abx therapy per ID (they recommend augmentin now)  - WBC down-trending 25 (from 34). Her wound looks clean and there does not appear to be anything further that needs debridement. She is HDS without fever, tachycardia or hypotension. Monitor. - Do not anticipate a need to return to the OR at this point - Cont NS WD dressing changes to wound BID  - PT   FEN - renal carb mod  diet VTE - SCDs, subq heparin ID - Zyvox/Unasyn (per ID) Foley - would defer timing of removal to primary/nephrology   Acute on CKD - per nephrology  HTN DM Obesity OSA     LOS: 5 days    Jill Alexanders , [facility] 06/23/2022, 10:04 AM Please see Amion for pager number during day hours 7:00am-4:30pm

## 2022-06-24 DIAGNOSIS — M729 Fibroblastic disorder, unspecified: Secondary | ICD-10-CM | POA: Diagnosis not present

## 2022-06-24 DIAGNOSIS — N170 Acute kidney failure with tubular necrosis: Secondary | ICD-10-CM | POA: Diagnosis not present

## 2022-06-24 DIAGNOSIS — E1122 Type 2 diabetes mellitus with diabetic chronic kidney disease: Secondary | ICD-10-CM | POA: Diagnosis not present

## 2022-06-24 DIAGNOSIS — A419 Sepsis, unspecified organism: Secondary | ICD-10-CM | POA: Diagnosis not present

## 2022-06-24 LAB — RENAL FUNCTION PANEL
Albumin: 1.5 g/dL — ABNORMAL LOW (ref 3.5–5.0)
Anion gap: 11 (ref 5–15)
BUN: 90 mg/dL — ABNORMAL HIGH (ref 6–20)
CO2: 18 mmol/L — ABNORMAL LOW (ref 22–32)
Calcium: 7.6 mg/dL — ABNORMAL LOW (ref 8.9–10.3)
Chloride: 109 mmol/L (ref 98–111)
Creatinine, Ser: 6.92 mg/dL — ABNORMAL HIGH (ref 0.44–1.00)
GFR, Estimated: 7 mL/min — ABNORMAL LOW (ref >60)
Glucose, Bld: 102 mg/dL — ABNORMAL HIGH (ref 70–99)
Phosphorus: 7 mg/dL — ABNORMAL HIGH (ref 2.5–4.6)
Potassium: 3.9 mmol/L (ref 3.5–5.1)
Sodium: 138 mmol/L (ref 135–145)

## 2022-06-24 LAB — GLUCOSE, CAPILLARY
Glucose-Capillary: 108 mg/dL — ABNORMAL HIGH (ref 70–99)
Glucose-Capillary: 98 mg/dL (ref 70–99)
Glucose-Capillary: 97 mg/dL (ref 70–99)
Glucose-Capillary: 88 mg/dL (ref 70–99)

## 2022-06-24 LAB — CBC
HCT: 30.4 % — ABNORMAL LOW (ref 36.0–46.0)
Hemoglobin: 10.3 g/dL — ABNORMAL LOW (ref 12.0–15.0)
MCH: 30.5 pg (ref 26.0–34.0)
MCHC: 33.9 g/dL (ref 30.0–36.0)
MCV: 89.9 fL (ref 80.0–100.0)
Platelets: 128 10*3/uL — ABNORMAL LOW (ref 150–400)
RBC: 3.38 MIL/uL — ABNORMAL LOW (ref 3.87–5.11)
RDW: 14.5 % (ref 11.5–15.5)
WBC: 23.6 10*3/uL — ABNORMAL HIGH (ref 4.0–10.5)
nRBC: 0.2 % (ref 0.0–0.2)

## 2022-06-24 MED ORDER — ACETAMINOPHEN 325 MG PO TABS
325.0000 mg | ORAL_TABLET | Freq: Four times a day (QID) | ORAL | Status: DC
  Administered 2022-06-24 – 2022-06-27 (×11): 325 mg via ORAL
  Filled 2022-06-24 (×11): qty 1

## 2022-06-24 MED ORDER — HYDROCODONE-ACETAMINOPHEN 5-325 MG PO TABS
1.0000 | ORAL_TABLET | ORAL | Status: DC | PRN
  Administered 2022-06-24 – 2022-06-26 (×2): 2 via ORAL
  Filled 2022-06-24: qty 2
  Filled 2022-06-24 (×3): qty 1

## 2022-06-24 MED ORDER — INSULIN ASPART 100 UNIT/ML IJ SOLN: 0.0000 [IU] | Freq: Three times a day (TID) | INTRAMUSCULAR | Status: DC

## 2022-06-24 NOTE — Progress Notes (Signed)
PROGRESS NOTE        PATIENT DETAILS Name: Jacqueline [name] Last Age: 41 y.o. Sex: female Date of Birth: 07/16/1981 Admit Date: 06/18/2022 Admitting Physician Arta Bruce Kinsinger, MD TOI:[id number], Larey Dresser, MD  Brief Summary: Patient is a 41 y.o.  female with history of CKD stage IV, DM-2, HTN, s/p gastric bypass 2020-presenting with severe sepsis due to necrotizing fasciitis of right thigh/groin-evaluated by orthopedics/general surgery-and underwent right thigh I&D/debridement-and was subsequently admitted by PCCM postoperatively.  She was stabilized and subsequently transferred to [facility] on 12/5.  Significant events: 12/2>> presenting with right thigh necrotizing fasciitis-s/p debridement by Ortho/general surgery-admit to ICU postoperatively. 12/4>> irrigation/debridement by general surgery 12/5>> transferred to [facility].  Significant studies: 12/2>> CT abdomen/pelvis: Findings concerning for necrotizing fasciitis in the right thigh.  Probable right psoas muscle abscess. 12/2>> CT of the right lower extremity: Necrotizing fasciitis/infectious myositis of right thigh.  Significant microbiology data: 12/2>> COVID/influenza PCR: Negative 12/2>> right thigh tissue: Streptococcus anginosus/mixed anaerobic flora. 12/2>> blood culture: No growth  Procedures: 12/2>> right thigh I&D by Ortho, advised pelvis I&D by general surgery 12/4>> right thigh/groin irrigation and debridement  Consults: Orthopedics General surgery Infectious disease Nephrology PCCM  Subjective: Vomited 4-5 times yesterday Sleepy this morning-some minimal nausea Continues to have significant pain in the right thigh area/operative site.  Objective: Vitals: Blood pressure (!) 155/83, pulse 87, temperature 97.8 F (36.6 C), temperature source Oral, resp. rate 18, height 5\' 5"  (1.651 m), weight 91.5 kg, last menstrual period 06/15/2022, SpO2 98 %.   Exam: Gen Exam:Alert awake-not in any  distress HEENT:atraumatic, normocephalic Chest: B/L clear to auscultation anteriorly CVS:S1S2 regular Abdomen:soft non tender, non distended Extremities:no edema Neurology: Non focal Skin: no rash  Pertinent Labs/Radiology:    Latest Ref Rng & Units 06/24/2022    3:46 AM 06/23/2022    8:11 AM 06/22/2022    4:15 AM  CBC  WBC 4.0 - 10.5 K/uL 23.6  25.0  34.7   Hemoglobin 12.0 - 15.0 g/dL 10.3  10.5  10.9   Hematocrit 36.0 - 46.0 % 30.4  31.9  32.0   Platelets 150 - 400 K/uL 128  167  198     Lab Results  Component Value Date   NA 138 06/24/2022   K 3.9 06/24/2022   CL 109 06/24/2022   CO2 18 (L) 06/24/2022      Assessment/Plan: Severe sepsis due to necrotizing fasciitis involving right thigh/pelvis Sepsis physiology has resolved-s/p I&D on 12/2 and 12/4 Leukocytosis slowly downtrending Per general surgery-surgical site looks clean without any evidence of infection  On Augmentin  Continues to have significant pain-on as needed narcotics.   AKI on CKD 4 AKI-likely due to ischemic ATN in the setting of sepsis physiology Some nausea/vomiting-suspect this is from narcotics but with rising BUN-uremia as a cause of concern. Discussed with nephrology-Plan is to continue to watch closely and initiate dialysis as needed. Foley catheter discontinued 12/7-continue to monitor urine output closely.  Hyperkalemia Resolved  Metabolic acidosis Due to CKD On oral bicarb supplementation  Normocytic anemia Due to a combination of anemia due to CKD and acute illness. Darbepoetin/iron defer to nephrology service Follow CBC  HTN BP currently stable All antihypertensives on hold-resume when able  DM-2 (A1c 5.4 on 12/5) with hyperglycemia Not on any medications at home Borderline hypoglycemic-change SSI to sensitive.  Recent Labs  06/23/22 1546 06/23/22 2117 06/24/22 0950  GLUCAP 79 81 108*     Hyponatremia Mild Follow-up for  now  Depression Stable Zoloft  BMI: Estimated body mass index is 33.57 kg/m as calculated from the following:   Height as of this encounter: 5\' 5"  (1.651 m).   Weight as of this encounter: 91.5 kg.   Code status:   Code Status: Prior   DVT Prophylaxis: heparin injection 5,000 Units Start: 06/18/22 1715   Family Communication: Family member at bedside.   Disposition Plan: Status is: Inpatient Remains inpatient appropriate because: Severity of illness   Planned Discharge Destination: Home health versus SNF   Diet: Diet Order             Diet regular Room service appropriate? Yes; Fluid consistency: Thin  Diet effective now                     Antimicrobial agents: Anti-infectives (From admission, onward)    Start     Dose/Rate Route Frequency Ordered Stop   06/22/22 2200  amoxicillin-clavulanate (AUGMENTIN) 500-125 MG per tablet 1 tablet        1 tablet Oral Daily at bedtime 06/22/22 1356 06/29/22 2159   06/22/22 0600  ceFAZolin (ANCEF) IVPB 2g/100 mL premix  Status:  Discontinued        2 g 200 mL/hr over 30 Minutes Intravenous On call to O.R. 06/21/22 1829 06/21/22 1830   06/21/22 1700  Ampicillin-Sulbactam (UNASYN) 3 g in sodium chloride 0.9 % 100 mL IVPB  Status:  Discontinued        3 g 200 mL/hr over 30 Minutes Intravenous Every 12 hours 06/21/22 1435 06/22/22 1356   06/18/22 1900  linezolid (ZYVOX) IVPB 600 mg  Status:  Discontinued        600 mg 300 mL/hr over 60 Minutes Intravenous Every 12 hours 06/18/22 1702 06/22/22 1134   06/18/22 1715  meropenem (MERREM) 1 g in sodium chloride 0.9 % 100 mL IVPB  Status:  Discontinued        1 g 200 mL/hr over 30 Minutes Intravenous Every 12 hours 06/18/22 1709 06/21/22 1411   06/18/22 0745  piperacillin-tazobactam (ZOSYN) IVPB 3.375 g        3.375 g 100 mL/hr over 30 Minutes Intravenous  Once 06/18/22 0733 06/18/22 0835        MEDICATIONS: Scheduled Meds:  (feeding supplement) PROSource Plus  30 mL  Oral BID BM   acetaminophen  325 mg Oral Q6H   amoxicillin-clavulanate  1 tablet Oral QHS   calcitRIOL  0.25 mcg Oral Daily   Chlorhexidine Gluconate Cloth  6 each Topical Daily   darbepoetin (ARANESP) injection - NON-DIALYSIS  100 mcg Subcutaneous Q Mon-1800   feeding supplement  237 mL Oral BID BM   heparin injection (subcutaneous)  5,000 Units Subcutaneous Q8H   insulin aspart  0-15 Units Subcutaneous TID WC   insulin aspart  0-5 Units Subcutaneous QHS   pantoprazole  40 mg Oral QHS   sertraline  100 mg Oral QHS   sodium bicarbonate  1,300 mg Oral BID   Continuous Infusions:   PRN Meds:.HYDROcodone-acetaminophen, HYDROmorphone (DILAUDID) injection, ondansetron (ZOFRAN) IV, phenol   I have personally reviewed following labs and imaging studies  LABORATORY DATA: CBC: Recent Labs  Lab 06/18/22 0750 06/18/22 1755 06/20/22 0448 06/21/22 0531 06/22/22 0415 06/23/22 0811 06/24/22 0346  WBC 27.8*   < > 24.8* 35.4* 34.7* 25.0* 23.6*  NEUTROABS 26.7*  --   --   --   --   --   --  HGB 5.9*   < > 9.2* 11.3* 10.9* 10.5* 10.3*  HCT 18.7*   < > 27.6* 32.6* 32.0* 31.9* 30.4*  MCV 91.7   < > 89.3 88.1 88.6 89.6 89.9  PLT 353   < > 219 241 198 167 128*   < > = values in this interval not displayed.     Basic Metabolic Panel: Recent Labs  Lab 06/19/22 0056 06/19/22 0749 06/20/22 0448 06/20/22 1725 06/21/22 0531 06/21/22 0532 06/22/22 0415 06/23/22 0811 06/24/22 0346  NA 139   < > 134* 134*  --  134* 132* 139 138  K 4.5   < > 4.3 4.8  --  4.4 3.8 4.0 3.9  CL 112*   < > 107 107  --  106 104 111 109  CO2 14*   < > 15* 12*  --  16* 15* 17* 18*  GLUCOSE 125*   < > 108* 179*  --  199* 143* 69* 102*  BUN 64*   < > 74* 76*  --  79* 87* 86* 90*  CREATININE 5.33*   < > 5.55* 5.86*  --  6.03* 6.62* 6.52* 6.92*  CALCIUM 7.2*   < > 6.5* 6.8*  --  7.3* 6.9* 7.6* 7.6*  MG 1.8  --  1.8  --  2.0  --   --   --   --   PHOS 6.4*  --  6.2*  --  7.1* 7.2* 6.6* 6.1* 7.0*   < > = values in  this interval not displayed.     GFR: Estimated Creatinine Clearance: 11.8 mL/min (A) (by C-G formula based on SCr of 6.92 mg/dL (H)).  Liver Function Tests: Recent Labs  Lab 06/18/22 0750 06/19/22 0056 06/21/22 0532 06/22/22 0415 06/23/22 0811 06/24/22 0346  AST 23 39  --   --   --   --   ALT 21 24  --   --   --   --   ALKPHOS 71 65  --   --   --   --   BILITOT 0.7 1.6*  --   --   --   --   PROT 6.0* 5.7*  --   --   --   --   ALBUMIN 1.7* 1.9* 1.6* <1.5* 1.5* 1.5*    No results for input(s): "LIPASE", "AMYLASE" in the last 168 hours. No results for input(s): "AMMONIA" in the last 168 hours.  Coagulation Profile: No results for input(s): "INR", "PROTIME" in the last 168 hours.  Cardiac Enzymes: No results for input(s): "CKTOTAL", "CKMB", "CKMBINDEX", "TROPONINI" in the last 168 hours.  BNP (last 3 results) No results for input(s): "PROBNP" in the last 8760 hours.  Lipid Profile: No results for input(s): "CHOL", "HDL", "LDLCALC", "TRIG", "CHOLHDL", "LDLDIRECT" in the last 72 hours.  Thyroid Function Tests: No results for input(s): "TSH", "T4TOTAL", "FREET4", "T3FREE", "THYROIDAB" in the last 72 hours.  Anemia Panel: No results for input(s): "VITAMINB12", "FOLATE", "FERRITIN", "TIBC", "IRON", "RETICCTPCT" in the last 72 hours.  Urine analysis:    Component Value Date/Time   COLORURINE YELLOW 06/18/2022 0820   APPEARANCEUR HAZY (A) 06/18/2022 0820   LABSPEC 1.013 06/18/2022 0820   PHURINE 7.0 06/18/2022 0820   GLUCOSEU 50 (A) 06/18/2022 0820   HGBUR NEGATIVE 06/18/2022 0820   BILIRUBINUR NEGATIVE 06/18/2022 0820   KETONESUR NEGATIVE 06/18/2022 0820   PROTEINUR >=300 (A) 06/18/2022 0820   UROBILINOGEN 0.2 08/21/2012 2130   NITRITE NEGATIVE 06/18/2022 0820   LEUKOCYTESUR TRACE (A)  06/18/2022 0820    Sepsis Labs: Lactic Acid, Venous    Component Value Date/Time   LATICACIDVEN 2.4 (HH) 06/18/2022 1755    MICROBIOLOGY: Recent Results (from the past 240  hour(s))  Resp Panel by RT-PCR (Flu A&B, Covid) Anterior Nasal Swab     Status: None   Collection Time: 06/18/22  3:31 AM   Specimen: Anterior Nasal Swab  Result Value Ref Range Status   SARS Coronavirus 2 by RT PCR NEGATIVE NEGATIVE Final    Comment: (NOTE) SARS-CoV-2 target nucleic acids are NOT DETECTED.  The SARS-CoV-2 RNA is generally detectable in upper respiratory specimens during the acute phase of infection. The lowest concentration of SARS-CoV-2 viral copies this assay can detect is 138 copies/mL. A negative result does not preclude SARS-Cov-2 infection and should not be used as the sole basis for treatment or other patient management decisions. A negative result may occur with  improper specimen collection/handling, submission of specimen other than nasopharyngeal swab, presence of viral mutation(s) within the areas targeted by this assay, and inadequate number of viral copies(<138 copies/mL). A negative result must be combined with clinical observations, patient history, and epidemiological information. The expected result is Negative.  Fact Sheet for Patients:  EntrepreneurPulse.com.au  Fact Sheet for Healthcare Providers:  IncredibleEmployment.be  This test is no t yet approved or cleared by the [place] FDA and  has been authorized for detection and/or diagnosis of SARS-CoV-2 by FDA under an Emergency Use Authorization (EUA). This EUA will remain  in effect (meaning this test can be used) for the duration of the COVID-19 declaration under Section 564(b)(1) of the Act, 21 U.S.C.section 360bbb-3(b)(1), unless the authorization is terminated  or revoked sooner.       Influenza A by PCR NEGATIVE NEGATIVE Final   Influenza B by PCR NEGATIVE NEGATIVE Final    Comment: (NOTE) The Xpert Xpress SARS-CoV-2/FLU/RSV plus assay is intended as an aid in the diagnosis of influenza from Nasopharyngeal swab specimens and should not be  used as a sole basis for treatment. Nasal washings and aspirates are unacceptable for Xpert Xpress SARS-CoV-2/FLU/RSV testing.  Fact Sheet for Patients: EntrepreneurPulse.com.au  Fact Sheet for Healthcare Providers: IncredibleEmployment.be  This test is not yet approved or cleared by the [place] FDA and has been authorized for detection and/or diagnosis of SARS-CoV-2 by FDA under an Emergency Use Authorization (EUA). This EUA will remain in effect (meaning this test can be used) for the duration of the COVID-19 declaration under Section 564(b)(1) of the Act, 21 U.S.C. section 360bbb-3(b)(1), unless the authorization is terminated or revoked.  Performed at [facility], [place] [address] [place], [place] [address]   Culture, blood (routine x 2)     Status: None   Collection Time: 06/18/22  7:30 AM   Specimen: BLOOD  Result Value Ref Range Status   Specimen Description BLOOD SITE NOT SPECIFIED  Final   Special Requests   Final    BOTTLES DRAWN AEROBIC AND ANAEROBIC Blood Culture results may not be optimal due to an inadequate volume of blood received in culture bottles   Culture   Final    NO GROWTH 5 DAYS Performed at [facility], [place] [address], [place],  [address]    Report Status 06/23/2022 FINAL  Final  Culture, blood (routine x 2)     Status: None   Collection Time: 06/18/22  7:51 AM   Specimen: BLOOD  Result Value Ref Range Status   Specimen Description BLOOD  SITE NOT SPECIFIED  Final   Special Requests   Final    BOTTLES DRAWN AEROBIC AND ANAEROBIC Blood Culture adequate volume   Culture   Final    NO GROWTH 5 DAYS Performed at [facility], [address], [place], [place] [address]    Report Status 06/23/2022 FINAL  Final  Aerobic/Anaerobic Culture w Gram Stain (surgical/deep wound)     Status: None   Collection Time: 06/18/22  3:04 PM   Specimen: PATH Other; Body Fluid  Result Value Ref Range  Status   Specimen Description FLUID RIGHT THIGH  Final   Special Requests PT ON ANCEF  Final   Gram Stain   Final    RARE WBC PRESENT, PREDOMINANTLY PMN FEW GRAM POSITIVE COCCI FEW GRAM NEGATIVE RODS Performed at [facility], [place] [address], [place], [place] [address]    Culture   Final    MODERATE STREPTOCOCCUS ANGINOSIS MIXED ANAEROBIC FLORA PRESENT.  CALL LAB IF FURTHER IID REQUIRED.    Report Status 06/21/2022 FINAL  Final   Organism ID, Bacteria STREPTOCOCCUS ANGINOSIS  Final      Susceptibility   Streptococcus anginosis - MIC*    PENICILLIN <=0.06 SENSITIVE Sensitive     CEFTRIAXONE <=0.12 SENSITIVE Sensitive     ERYTHROMYCIN 4 RESISTANT Resistant     LEVOFLOXACIN 0.5 SENSITIVE Sensitive     VANCOMYCIN 0.5 SENSITIVE Sensitive     * MODERATE STREPTOCOCCUS ANGINOSIS  MRSA Next Gen by PCR, Nasal     Status: None   Collection Time: 06/18/22  5:55 PM   Specimen: Nasal Mucosa; Nasal Swab  Result Value Ref Range Status   MRSA by PCR Next Gen NOT DETECTED NOT DETECTED Final    Comment: (NOTE) The GeneXpert MRSA Assay (FDA approved for NASAL specimens only), is one component of a comprehensive MRSA colonization surveillance program. It is not intended to diagnose MRSA infection nor to guide or monitor treatment for MRSA infections. Test performance is not FDA approved in patients less than 83 years old. Performed at [facility], [place] [address] [place], [place] [address]     RADIOLOGY STUDIES/RESULTS: No results found.   LOS: 6 days   Oren Binet, MD  [facility]    To contact the attending provider between 7A-7P or the covering provider during after hours 7P-7A, please log into the web site [email or web address] and access using universal [place] password for that web site. If you do not have the password, please call the hospital operator.  06/24/2022, 11:47 AM

## 2022-06-24 NOTE — Progress Notes (Signed)
4 Days Post-Op  Subjective: Oxy making her sick, tramadol not helping, only thing working for pain is dilaudid.  Says that vicodin has worked in the past.  Dressing just changed.  Patients mother is at bedside, plans to help with wound care once pt discharged  Objective: Vital signs in last 24 hours: Temp:  [97.8 F (36.6 C)-98.3 F (36.8 C)] 97.8 F (36.6 C) (12/08 0825) Pulse Rate:  [84-103] 87 (12/08 0825) Resp:  [10-22] 18 (12/08 0825) BP: (128-155)/(76-86) 155/83 (12/08 0825) SpO2:  [94 %-100 %] 98 % (12/08 0825) Last BM Date : 06/23/22  Intake/Output from previous day: 12/07 0701 - 12/08 0700 In: -  Out: 450 [Urine:450] Intake/Output this shift: Total I/O In: 200 [P.O.:200] Out: -   PE: Gen:  Alert, NAD, pleasant Wound: dressing clean.  Wound examined and overall clean with no purulent/cloudy fluid.  No overt necrotic tissue in either wound.  Overall stable Psych: A&Ox3   Lab Results:  Recent Labs    06/23/22 0811 06/24/22 0346  WBC 25.0* 23.6*  HGB 10.5* 10.3*  HCT 31.9* 30.4*  PLT 167 128*   BMET Recent Labs    06/23/22 0811 06/24/22 0346  NA 139 138  K 4.0 3.9  CL 111 109  CO2 17* 18*  GLUCOSE 69* 102*  BUN 86* 90*  CREATININE 6.52* 6.92*  CALCIUM 7.6* 7.6*   PT/INR No results for input(s): "LABPROT", "INR" in the last 72 hours. CMP     Component Value Date/Time   NA 138 06/24/2022 0346   K 3.9 06/24/2022 0346   CL 109 06/24/2022 0346   CO2 18 (L) 06/24/2022 0346   GLUCOSE 102 (H) 06/24/2022 0346   BUN 90 (H) 06/24/2022 0346   CREATININE 6.92 (H) 06/24/2022 0346   CALCIUM 7.6 (L) 06/24/2022 0346   PROT 5.7 (L) 06/19/2022 0056   ALBUMIN 1.5 (L) 06/24/2022 0346   AST 39 06/19/2022 0056   ALT 24 06/19/2022 0056   ALKPHOS 65 06/19/2022 0056   BILITOT 1.6 (H) 06/19/2022 0056   GFRNONAA 7 (L) 06/24/2022 0346   GFRAA 31 (L) 12/03/2019 0345   Lipase     Component Value Date/Time   LIPASE 40 08/27/2021 0811     Studies/Results: No results found.  Anti-infectives: Anti-infectives (From admission, onward)    Start     Dose/Rate Route Frequency Ordered Stop   06/22/22 2200  amoxicillin-clavulanate (AUGMENTIN) 500-125 MG per tablet 1 tablet        1 tablet Oral Daily at bedtime 06/22/22 1356 06/29/22 2159   06/22/22 0600  ceFAZolin (ANCEF) IVPB 2g/100 mL premix  Status:  Discontinued        2 g 200 mL/hr over 30 Minutes Intravenous On call to O.R. 06/21/22 1829 06/21/22 1830   06/21/22 1700  Ampicillin-Sulbactam (UNASYN) 3 g in sodium chloride 0.9 % 100 mL IVPB  Status:  Discontinued        3 g 200 mL/hr over 30 Minutes Intravenous Every 12 hours 06/21/22 1435 06/22/22 1356   06/18/22 1900  linezolid (ZYVOX) IVPB 600 mg  Status:  Discontinued        600 mg 300 mL/hr over 60 Minutes Intravenous Every 12 hours 06/18/22 1702 06/22/22 1134   06/18/22 1715  meropenem (MERREM) 1 g in sodium chloride 0.9 % 100 mL IVPB  Status:  Discontinued        1 g 200 mL/hr over 30 Minutes Intravenous Every 12 hours 06/18/22 1709 06/21/22 1411  06/18/22 0745  piperacillin-tazobactam (ZOSYN) IVPB 3.375 g        3.375 g 100 mL/hr over 30 Minutes Intravenous  Once 06/18/22 0981 06/18/22 0835        Assessment/Plan POD 6/4, s/p excisional debridement of NSTI of right groin/thigh, Dr. Toy Cookey, Dr. Mable Fill (ortho) - Cx w/ moderate strep anginosis w/ mixed anaerobic flora present. Cont abx therapy per ID (they recommend augmentin now)  - WBC down-trending 23. Her wound looks clean and there does not appear to be anything further that needs debridement. She is HDS without fever, tachycardia or hypotension. Monitor. - Do not anticipate a need to return to the OR at this point - Cont NS WD dressing changes to wound BID  - PT, fell twice yesterday apparently -change pain meds up to vicodin and decrease scheduled tylenol  FEN - renal carb mod diet VTE - SCDs, subq heparin ID - Augmentin, per ID Foley  - purewick  Acute on CKD - per nephrology  HTN DM Obesity OSA     LOS: 6 days    Henreitta Cea , [facility] 06/24/2022, 10:43 AM Please see Amion for pager number during day hours 7:00am-4:30pm

## 2022-06-24 NOTE — Progress Notes (Signed)
Subjective:  only 450 UOP recorded -  crt went up again  -  she overall says she is feeling better-  did have a lot of nausea yesterday but none today   Objective Vital signs in last 24 hours: Vitals:   06/24/22 0010 06/24/22 0400 06/24/22 0624 06/24/22 0825  BP: (!) 142/85 (!) 146/86  (!) 155/83  Pulse: 89 84  87  Resp: 18 15  18   Temp: 97.8 F (36.6 C) 97.8 F (36.6 C)  97.8 F (36.6 C)  TempSrc: Oral Oral  Oral  SpO2: 97% 97% 98% 98%  Weight:      Height:       Weight change:   Intake/Output Summary (Last 24 hours) at 06/24/2022 1335 Last data filed at 06/24/2022 0940 Gross per 24 hour  Intake 200 ml  Output 450 ml  Net -250 ml     Assessment/Plan: 41 year old black female with progressive CKD as outpatient followed by [facility] with baseline creatinine in the fours.  She now presents with necrotizing fasciitis requiring incision and debridement x2.  She has had some worsening of renal function in this setting 1.Renal-baseline CKD that is been progressive as outpatient.  Presumably secondary to diabetes and hypertension.  She has had a  decrease in her GFR in the setting of necrotizing fasciitis and debridements.  Is going to be really difficult to tell if she is having symptoms related to uremia or related to her current medical situation.  She reports feeling really well a week prior to admission.  There are no absolute acute indications for dialysis at this time.  What we need to do is just follow her kidney function as well as her clinical improvement trajectory.  If she does not improve we could possibly say that uremia is the culprit.  In that case, initiation of dialysis might be warranted.  Patient seems to understand the concepts-  so far is OK -  crt worse but a BUN of 90 does not usually give uremic sxms in a 41 year old.  Continue to watch  2. Hypertension/volume  -normally on antihypertensives as outpatient.  Right now blood pressure is fine on no meds.  Volume  status seems may be just a little heavy but close to euvolemic.  No recommendations in that department 3.  Necrotizing fasciitis-status post debridements x2, per surgery-also on antibiotics 4.  Metabolic acidosis-  oral serum bicarbonate 5. Anemia  -due to CKD and surgery-we will give ESA but hold off on iron right now 6.  Bones-PTH 415-  started some calcitriol-  Phos high-  will keep in mind but wont start binders right now due to GI issues Louis Meckel    Labs: Basic Metabolic Panel: Recent Labs  Lab 06/22/22 0415 06/23/22 0811 06/24/22 0346  NA 132* 139 138  K 3.8 4.0 3.9  CL 104 111 109  CO2 15* 17* 18*  GLUCOSE 143* 69* 102*  BUN 87* 86* 90*  CREATININE 6.62* 6.52* 6.92*  CALCIUM 6.9* 7.6* 7.6*  PHOS 6.6* 6.1* 7.0*   Liver Function Tests: Recent Labs  Lab 06/18/22 0750 06/19/22 0056 06/21/22 0532 06/22/22 0415 06/23/22 0811 06/24/22 0346  AST 23 39  --   --   --   --   ALT 21 24  --   --   --   --   ALKPHOS 71 65  --   --   --   --  BILITOT 0.7 1.6*  --   --   --   --   PROT 6.0* 5.7*  --   --   --   --   ALBUMIN 1.7* 1.9*   < > <1.5* 1.5* 1.5*   < > = values in this interval not displayed.   No results for input(s): "LIPASE", "AMYLASE" in the last 168 hours. No results for input(s): "AMMONIA" in the last 168 hours. CBC: Recent Labs  Lab 06/18/22 0750 06/18/22 1755 06/20/22 0448 06/21/22 0531 06/22/22 0415 06/23/22 0811 06/24/22 0346  WBC 27.8*   < > 24.8* 35.4* 34.7* 25.0* 23.6*  NEUTROABS 26.7*  --   --   --   --   --   --   HGB 5.9*   < > 9.2* 11.3* 10.9* 10.5* 10.3*  HCT 18.7*   < > 27.6* 32.6* 32.0* 31.9* 30.4*  MCV 91.7   < > 89.3 88.1 88.6 89.6 89.9  PLT 353   < > 219 241 198 167 128*   < > = values in this interval not displayed.   Cardiac Enzymes: No results for input(s): "CKTOTAL", "CKMB", "CKMBINDEX", "TROPONINI" in the last 168 hours. CBG: Recent Labs  Lab 06/23/22 1205 06/23/22 1546 06/23/22 2117  06/24/22 0950 06/24/22 1154  GLUCAP 74 79 81 108* 98    Iron Studies: No results for input(s): "IRON", "TIBC", "TRANSFERRIN", "FERRITIN" in the last 72 hours. Studies/Results: No results found. Medications: Infusions:    Scheduled Medications:  (feeding supplement) PROSource Plus  30 mL Oral BID BM   acetaminophen  325 mg Oral Q6H   amoxicillin-clavulanate  1 tablet Oral QHS   calcitRIOL  0.25 mcg Oral Daily   Chlorhexidine Gluconate Cloth  6 each Topical Daily   darbepoetin (ARANESP) injection - NON-DIALYSIS  100 mcg Subcutaneous Q Mon-1800   feeding supplement  237 mL Oral BID BM   heparin injection (subcutaneous)  5,000 Units Subcutaneous Q8H   insulin aspart  0-5 Units Subcutaneous QHS   pantoprazole  40 mg Oral QHS   sertraline  100 mg Oral QHS   sodium bicarbonate  1,300 mg Oral BID    have reviewed scheduled and prn medications.  Physical Exam: General: alert, NAD Heart: RRR Lungs: mostly clear Abdomen: obese, post op Extremities: trace edema    06/24/2022,1:35 PM  LOS: 6 days

## 2022-06-24 NOTE — Plan of Care (Signed)
  Problem: Education: Goal: Ability to describe self-care measures that may prevent or decrease complications (Diabetes Survival Skills Education) will improve Outcome: Progressing   Problem: Coping: Goal: Ability to adjust to condition or change in health will improve Outcome: Progressing   Problem: Skin Integrity: Goal: Risk for impaired skin integrity will decrease Outcome: Progressing   Problem: Education: Goal: Knowledge of General Education information will improve Description: Including pain rating scale, medication(s)/side effects and non-pharmacologic comfort measures Outcome: Progressing   Problem: Activity: Goal: Risk for activity intolerance will decrease Outcome: Progressing   Problem: Nutrition: Goal: Adequate nutrition will be maintained Outcome: Progressing   Problem: Coping: Goal: Level of anxiety will decrease Outcome: Progressing   Problem: Pain Managment: Goal: General experience of comfort will improve Outcome: Progressing   

## 2022-06-25 DIAGNOSIS — E1122 Type 2 diabetes mellitus with diabetic chronic kidney disease: Secondary | ICD-10-CM | POA: Diagnosis not present

## 2022-06-25 DIAGNOSIS — A419 Sepsis, unspecified organism: Secondary | ICD-10-CM | POA: Diagnosis not present

## 2022-06-25 DIAGNOSIS — N170 Acute kidney failure with tubular necrosis: Secondary | ICD-10-CM | POA: Diagnosis not present

## 2022-06-25 DIAGNOSIS — M729 Fibroblastic disorder, unspecified: Secondary | ICD-10-CM | POA: Diagnosis not present

## 2022-06-25 LAB — GLUCOSE, CAPILLARY
Glucose-Capillary: 82 mg/dL (ref 70–99)
Glucose-Capillary: 78 mg/dL (ref 70–99)
Glucose-Capillary: 97 mg/dL (ref 70–99)
Glucose-Capillary: 85 mg/dL (ref 70–99)

## 2022-06-25 LAB — RENAL FUNCTION PANEL
Albumin: 1.7 g/dL — ABNORMAL LOW (ref 3.5–5.0)
Anion gap: 11 (ref 5–15)
BUN: 88 mg/dL — ABNORMAL HIGH (ref 6–20)
CO2: 18 mmol/L — ABNORMAL LOW (ref 22–32)
Calcium: 7.5 mg/dL — ABNORMAL LOW (ref 8.9–10.3)
Chloride: 114 mmol/L — ABNORMAL HIGH (ref 98–111)
Creatinine, Ser: 6.5 mg/dL — ABNORMAL HIGH (ref 0.44–1.00)
GFR, Estimated: 8 mL/min — ABNORMAL LOW (ref >60)
Glucose, Bld: 85 mg/dL (ref 70–99)
Phosphorus: 5.9 mg/dL — ABNORMAL HIGH (ref 2.5–4.6)
Potassium: 4.1 mmol/L (ref 3.5–5.1)
Sodium: 143 mmol/L (ref 135–145)

## 2022-06-25 LAB — CBC
HCT: 33.1 % — ABNORMAL LOW (ref 36.0–46.0)
Hemoglobin: 11.4 g/dL — ABNORMAL LOW (ref 12.0–15.0)
MCH: 30.6 pg (ref 26.0–34.0)
MCHC: 34.4 g/dL (ref 30.0–36.0)
MCV: 89 fL (ref 80.0–100.0)
Platelets: 125 10*3/uL — ABNORMAL LOW (ref 150–400)
RBC: 3.72 MIL/uL — ABNORMAL LOW (ref 3.87–5.11)
RDW: 14.2 % (ref 11.5–15.5)
WBC: 22.5 10*3/uL — ABNORMAL HIGH (ref 4.0–10.5)
nRBC: 0.4 % — ABNORMAL HIGH (ref 0.0–0.2)

## 2022-06-25 MED ORDER — NYSTATIN 100000 UNIT/GM EX POWD
Freq: Three times a day (TID) | CUTANEOUS | Status: DC
  Administered 2022-06-25: 1 via TOPICAL
  Filled 2022-06-25 (×2): qty 15

## 2022-06-25 MED ORDER — AMLODIPINE BESYLATE 5 MG PO TABS
5.0000 mg | ORAL_TABLET | Freq: Every day | ORAL | Status: DC
  Administered 2022-06-25 – 2022-06-26 (×2): 5 mg via ORAL
  Filled 2022-06-25 (×2): qty 1

## 2022-06-25 NOTE — Plan of Care (Signed)

## 2022-06-25 NOTE — Progress Notes (Signed)
Subjective:  UOP not well recorded -  crt went down and BUN stable to improved as well -  she overall says she is feeling better-  she actually does look a little better today -  no N/V  Objective Vital signs in last 24 hours: Vitals:   06/24/22 1948 06/24/22 2303 06/25/22 0321 06/25/22 0800  BP: (!) 159/85 (!) 182/96 (!) 162/92 (!) 176/89  Pulse: 93 92 95 91  Resp: 16 20 16 15   Temp: 98.1 F (36.7 C) 98.5 F (36.9 C)  98.4 F (36.9 C)  TempSrc: Oral Oral  Oral  SpO2: 98% 98% 97% 100%  Weight:      Height:       Weight change:   Intake/Output Summary (Last 24 hours) at 06/25/2022 1318 Last data filed at 06/24/2022 1700 Gross per 24 hour  Intake 240 ml  Output --  Net 240 ml     Assessment/Plan: 41 year old black female with progressive CKD as outpatient followed by [facility] with baseline creatinine in the fours.  She now presents with necrotizing fasciitis requiring incision and debridement x2.  She has had some worsening of renal function in this setting 1.Renal-baseline CKD that is been progressive as outpatient.  Presumably secondary to diabetes and hypertension.  She has had a  decrease in her GFR in the setting of necrotizing fasciitis and debridements.  Is going to be really difficult to tell if she is having symptoms related to uremia or related to her current medical situation.  She reports feeling really well a week prior to admission.  There are no absolute acute indications for dialysis at this time.  What we need to do is just follow her kidney function as well as her clinical improvement trajectory.  If she does not improve we could possibly say that uremia is the culprit.  In that case, initiation of dialysis might be warranted.  Patient seems to understand the concepts-  so far is OK -  crt worse but a BUN of 90 does not usually give uremic sxms in a 41 year old.  Continue to watch  2. Hypertension/volume  -normally on antihypertensives as outpatient.  Right now  blood pressure is higher on no meds-  agree with starting norvasc.  Volume status seems may be just a little heavy but close to euvolemic.  3.  Necrotizing fasciitis-status post debridements x2, per surgery-also on antibiotics 4.  Metabolic acidosis-  oral serum bicarbonate 5. Anemia  -due to CKD and surgery-we will give ESA but hold off on iron right now 6.  Bones-PTH 415-  started some calcitriol-  Phos high-  will keep in mind but wont start binders right now due to GI issues Jacqueline [name]    Labs: Basic Metabolic Panel: Recent Labs  Lab 06/23/22 0811 06/24/22 0346 06/25/22 0223  NA 139 138 143  K 4.0 3.9 4.1  CL 111 109 114*  CO2 17* 18* 18*  GLUCOSE 69* 102* 85  BUN 86* 90* 88*  CREATININE 6.52* 6.92* 6.50*  CALCIUM 7.6* 7.6* 7.5*  PHOS 6.1* 7.0* 5.9*   Liver Function Tests: Recent Labs  Lab 06/19/22 0056 06/21/22 0532 06/23/22 0811 06/24/22 0346 06/25/22 0223  AST 39  --   --   --   --   ALT 24  --   --   --   --   ALKPHOS 65  --   --   --   --  BILITOT 1.6*  --   --   --   --   PROT 5.7*  --   --   --   --   ALBUMIN 1.9*   < > 1.5* 1.5* 1.7*   < > = values in this interval not displayed.   No results for input(s): "LIPASE", "AMYLASE" in the last 168 hours. No results for input(s): "AMMONIA" in the last 168 hours. CBC: Recent Labs  Lab 06/21/22 0531 06/22/22 0415 06/23/22 0811 06/24/22 0346 06/25/22 0223  WBC 35.4* 34.7* 25.0* 23.6* 22.5*  HGB 11.3* 10.9* 10.5* 10.3* 11.4*  HCT 32.6* 32.0* 31.9* 30.4* 33.1*  MCV 88.1 88.6 89.6 89.9 89.0  PLT 241 198 167 128* 125*   Cardiac Enzymes: No results for input(s): "CKTOTAL", "CKMB", "CKMBINDEX", "TROPONINI" in the last 168 hours. CBG: Recent Labs  Lab 06/24/22 1154 06/24/22 1630 06/24/22 2057 06/25/22 0741 06/25/22 1139  GLUCAP 98 97 88 82 78    Iron Studies: No results for input(s): "IRON", "TIBC", "TRANSFERRIN", "FERRITIN" in the last 72 hours. Studies/Results: No results  found. Medications: Infusions:    Scheduled Medications:  (feeding supplement) PROSource Plus  30 mL Oral BID BM   acetaminophen  325 mg Oral Q6H   amLODipine  5 mg Oral Daily   amoxicillin-clavulanate  1 tablet Oral QHS   calcitRIOL  0.25 mcg Oral Daily   Chlorhexidine Gluconate Cloth  6 each Topical Daily   darbepoetin (ARANESP) injection - NON-DIALYSIS  100 mcg Subcutaneous Q Mon-1800   feeding supplement  237 mL Oral BID BM   heparin injection (subcutaneous)  5,000 Units Subcutaneous Q8H   insulin aspart  0-5 Units Subcutaneous QHS   nystatin   Topical TID   pantoprazole  40 mg Oral QHS   sertraline  100 mg Oral QHS   sodium bicarbonate  1,300 mg Oral BID    have reviewed scheduled and prn medications.  Physical Exam: General: alert, NAD Heart: RRR Lungs: mostly clear Abdomen: obese, post op Extremities: trace edema    06/25/2022,1:18 PM  LOS: 7 days

## 2022-06-25 NOTE — Progress Notes (Signed)
PROGRESS NOTE        PATIENT DETAILS Name: Jacqueline [name] Last Age: 41 y.o. Sex: female Date of Birth: [birth year] Admit Date: 06/18/2022 Admitting Physician Arta Bruce Kinsinger, MD TDV:VOHYWV, Larey Dresser, MD  Brief Summary: Patient is a 41 y.o.  female with history of CKD stage IV, DM-2, HTN, s/p gastric bypass 2020-presenting with severe sepsis due to necrotizing fasciitis of right thigh/groin-evaluated by orthopedics/general surgery-and underwent right thigh I&D/debridement-and was subsequently admitted by PCCM postoperatively.  She was stabilized and subsequently transferred to [facility] on 12/5.  Significant events: 12/2>> presenting with right thigh necrotizing fasciitis-s/p debridement by Ortho/general surgery-admit to ICU postoperatively. 12/4>> irrigation/debridement by general surgery 12/5>> transferred to [facility].  Significant studies: 12/2>> CT abdomen/pelvis: Findings concerning for necrotizing fasciitis in the right thigh.  Probable right psoas muscle abscess. 12/2>> CT of the right lower extremity: Necrotizing fasciitis/infectious myositis of right thigh.  Significant microbiology data: 12/2>> COVID/influenza PCR: Negative 12/2>> right thigh tissue: Streptococcus anginosus/mixed anaerobic flora. 12/2>> blood culture: No growth  Procedures: 12/2>> right thigh I&D by Ortho, advised pelvis I&D by general surgery 12/4>> right thigh/groin irrigation and debridement  Consults: Orthopedics General surgery Infectious disease Nephrology PCCM  Subjective: 1 episode of vomiting overnight Some moisture associated skin injury in the perineal area Continues to have pain at the surgical site.  Objective: Vitals: Blood pressure (!) 176/89, pulse 91, temperature 98.4 F (36.9 C), temperature source Oral, resp. rate 15, height 5\' 5"  (1.651 m), weight 91.5 kg, last menstrual period 06/15/2022, SpO2 100 %.   Exam: Gen Exam:Alert awake-not in any  distress HEENT:atraumatic, normocephalic Chest: B/L clear to auscultation anteriorly CVS:S1S2 regular Abdomen:soft non tender, non distended Extremities:no edema Neurology: Non focal Skin: no rash  Pertinent Labs/Radiology:    Latest Ref Rng & Units 06/25/2022    2:23 AM 06/24/2022    3:46 AM 06/23/2022    8:11 AM  CBC  WBC 4.0 - 10.5 K/uL 22.5  23.6  25.0   Hemoglobin 12.0 - 15.0 g/dL 11.4  10.3  10.5   Hematocrit 36.0 - 46.0 % 33.1  30.4  31.9   Platelets 150 - 400 K/uL 125  128  167     Lab Results  Component Value Date   NA 143 06/25/2022   K 4.1 06/25/2022   CL 114 (H) 06/25/2022   CO2 18 (L) 06/25/2022      Assessment/Plan: Severe sepsis due to necrotizing fasciitis involving right thigh/pelvis Sepsis physiology has resolved-s/p I&D on 12/2 and 12/4 Leukocytosis slowly downtrending Per general surgery-surgical site looks clean without any evidence of infection  On Augmentin  Continues to have significant pain-on as needed narcotics.   AKI on CKD 4 AKI-likely due to ischemic ATN in the setting of sepsis physiology Continues to have some intermittent nausea/vomiting-this is likely due to narcotics/postop issues-but due to worsening renal failure-uremia is also a cause of concern.   Discussed with nephrology on 12/8-follow renal function-if symptoms do not improve-then nephrology may consider HD at some point.    Hyperkalemia Resolved  Metabolic acidosis Due to CKD On oral bicarb supplementation  Normocytic anemia Due to a combination of anemia due to CKD and acute illness. Darbepoetin/iron defer to nephrology service Follow CBC  HTN BP creeping up  Starting low-dose amlodipine  Follow/optimize accordingly.  Currently stable   DM-2 (A1c 5.4 on 12/5) with hyperglycemia Not on  any medications at home Monitor closely on extra sensitive SSI  Recent Labs    06/24/22 1630 06/24/22 2057 06/25/22 0741  GLUCAP 97 88 82     Hyponatremia Mild Follow-up  for now  Depression Stable Zoloft  BMI: Estimated body mass index is 33.57 kg/m as calculated from the following:   Height as of this encounter: 5\' 5"  (1.651 m).   Weight as of this encounter: 91.5 kg.   Code status:   Code Status: Prior   DVT Prophylaxis: heparin injection 5,000 Units Start: 06/18/22 1715   Family Communication: Mother at bedside.   Disposition Plan: Status is: Inpatient Remains inpatient appropriate because: Severity of illness   Planned Discharge Destination: Home health versus SNF   Diet: Diet Order             Diet regular Room service appropriate? Yes; Fluid consistency: Thin  Diet effective now                     Antimicrobial agents: Anti-infectives (From admission, onward)    Start     Dose/Rate Route Frequency Ordered Stop   06/22/22 2200  amoxicillin-clavulanate (AUGMENTIN) 500-125 MG per tablet 1 tablet        1 tablet Oral Daily at bedtime 06/22/22 1356 06/29/22 2159   06/22/22 0600  ceFAZolin (ANCEF) IVPB 2g/100 mL premix  Status:  Discontinued        2 g 200 mL/hr over 30 Minutes Intravenous On call to O.R. 06/21/22 1829 06/21/22 1830   06/21/22 1700  Ampicillin-Sulbactam (UNASYN) 3 g in sodium chloride 0.9 % 100 mL IVPB  Status:  Discontinued        3 g 200 mL/hr over 30 Minutes Intravenous Every 12 hours 06/21/22 1435 06/22/22 1356   06/18/22 1900  linezolid (ZYVOX) IVPB 600 mg  Status:  Discontinued        600 mg 300 mL/hr over 60 Minutes Intravenous Every 12 hours 06/18/22 1702 06/22/22 1134   06/18/22 1715  meropenem (MERREM) 1 g in sodium chloride 0.9 % 100 mL IVPB  Status:  Discontinued        1 g 200 mL/hr over 30 Minutes Intravenous Every 12 hours 06/18/22 1709 06/21/22 1411   06/18/22 0745  piperacillin-tazobactam (ZOSYN) IVPB 3.375 g        3.375 g 100 mL/hr over 30 Minutes Intravenous  Once 06/18/22 0733 06/18/22 0835        MEDICATIONS: Scheduled Meds:  (feeding supplement) PROSource Plus  30 mL Oral  BID BM   acetaminophen  325 mg Oral Q6H   amoxicillin-clavulanate  1 tablet Oral QHS   calcitRIOL  0.25 mcg Oral Daily   Chlorhexidine Gluconate Cloth  6 each Topical Daily   darbepoetin (ARANESP) injection - NON-DIALYSIS  100 mcg Subcutaneous Q Mon-1800   feeding supplement  237 mL Oral BID BM   heparin injection (subcutaneous)  5,000 Units Subcutaneous Q8H   insulin aspart  0-5 Units Subcutaneous QHS   nystatin   Topical TID   pantoprazole  40 mg Oral QHS   sertraline  100 mg Oral QHS   sodium bicarbonate  1,300 mg Oral BID   Continuous Infusions:   PRN Meds:.HYDROcodone-acetaminophen, HYDROmorphone (DILAUDID) injection, ondansetron (ZOFRAN) IV, phenol   I have personally reviewed following labs and imaging studies  LABORATORY DATA: CBC: Recent Labs  Lab 06/21/22 0531 06/22/22 0415 06/23/22 0811 06/24/22 0346 06/25/22 0223  WBC 35.4* 34.7* 25.0* 23.6* 22.5*  HGB 11.3* 10.9*  10.5* 10.3* 11.4*  HCT 32.6* 32.0* 31.9* 30.4* 33.1*  MCV 88.1 88.6 89.6 89.9 89.0  PLT 241 198 167 128* 125*     Basic Metabolic Panel: Recent Labs  Lab 06/19/22 0056 06/19/22 0749 06/20/22 0448 06/20/22 1725 06/21/22 0531 06/21/22 0532 06/22/22 0415 06/23/22 0811 06/24/22 0346 06/25/22 0223  NA 139   < > 134*   < >  --  134* 132* 139 138 143  K 4.5   < > 4.3   < >  --  4.4 3.8 4.0 3.9 4.1  CL 112*   < > 107   < >  --  106 104 111 109 114*  CO2 14*   < > 15*   < >  --  16* 15* 17* 18* 18*  GLUCOSE 125*   < > 108*   < >  --  199* 143* 69* 102* 85  BUN 64*   < > 74*   < >  --  79* 87* 86* 90* 88*  CREATININE 5.33*   < > 5.55*   < >  --  6.03* 6.62* 6.52* 6.92* 6.50*  CALCIUM 7.2*   < > 6.5*   < >  --  7.3* 6.9* 7.6* 7.6* 7.5*  MG 1.8  --  1.8  --  2.0  --   --   --   --   --   PHOS 6.4*  --  6.2*  --  7.1* 7.2* 6.6* 6.1* 7.0* 5.9*   < > = values in this interval not displayed.     GFR: Estimated Creatinine Clearance: 12.6 mL/min (A) (by C-G formula based on SCr of 6.5 mg/dL  (H)).  Liver Function Tests: Recent Labs  Lab 06/19/22 0056 06/21/22 0532 06/22/22 0415 06/23/22 0811 06/24/22 0346 06/25/22 0223  AST 39  --   --   --   --   --   ALT 24  --   --   --   --   --   ALKPHOS 65  --   --   --   --   --   BILITOT 1.6*  --   --   --   --   --   PROT 5.7*  --   --   --   --   --   ALBUMIN 1.9* 1.6* <1.5* 1.5* 1.5* 1.7*    No results for input(s): "LIPASE", "AMYLASE" in the last 168 hours. No results for input(s): "AMMONIA" in the last 168 hours.  Coagulation Profile: No results for input(s): "INR", "PROTIME" in the last 168 hours.  Cardiac Enzymes: No results for input(s): "CKTOTAL", "CKMB", "CKMBINDEX", "TROPONINI" in the last 168 hours.  BNP (last 3 results) No results for input(s): "PROBNP" in the last 8760 hours.  Lipid Profile: No results for input(s): "CHOL", "HDL", "LDLCALC", "TRIG", "CHOLHDL", "LDLDIRECT" in the last 72 hours.  Thyroid Function Tests: No results for input(s): "TSH", "T4TOTAL", "FREET4", "T3FREE", "THYROIDAB" in the last 72 hours.  Anemia Panel: No results for input(s): "VITAMINB12", "FOLATE", "FERRITIN", "TIBC", "IRON", "RETICCTPCT" in the last 72 hours.  Urine analysis:    Component Value Date/Time   COLORURINE YELLOW 06/18/2022 0820   APPEARANCEUR HAZY (A) 06/18/2022 0820   LABSPEC 1.013 06/18/2022 0820   PHURINE 7.0 06/18/2022 0820   GLUCOSEU 50 (A) 06/18/2022 0820   HGBUR NEGATIVE 06/18/2022 0820   BILIRUBINUR NEGATIVE 06/18/2022 0820   KETONESUR NEGATIVE 06/18/2022 0820   PROTEINUR >=300 (A) 06/18/2022 0820   UROBILINOGEN  0.2 08/21/2012 2130   NITRITE NEGATIVE 06/18/2022 0820   LEUKOCYTESUR TRACE (A) 06/18/2022 0820    Sepsis Labs: Lactic Acid, Venous    Component Value Date/Time   LATICACIDVEN 2.4 ([place]) 06/18/2022 1755    MICROBIOLOGY: Recent Results (from the past 240 hour(s))  Resp Panel by RT-PCR (Flu A&B, Covid) Anterior Nasal Swab     Status: None   Collection Time: 06/18/22  3:31 AM    Specimen: Anterior Nasal Swab  Result Value Ref Range Status   SARS Coronavirus 2 by RT PCR NEGATIVE NEGATIVE Final    Comment: (NOTE) SARS-CoV-2 target nucleic acids are NOT DETECTED.  The SARS-CoV-2 RNA is generally detectable in upper respiratory specimens during the acute phase of infection. The lowest concentration of SARS-CoV-2 viral copies this assay can detect is 138 copies/mL. A negative result does not preclude SARS-Cov-2 infection and should not be used as the sole basis for treatment or other patient management decisions. A negative result may occur with  improper specimen collection/handling, submission of specimen other than nasopharyngeal swab, presence of viral mutation(s) within the areas targeted by this assay, and inadequate number of viral copies(<138 copies/mL). A negative result must be combined with clinical observations, patient history, and epidemiological information. The expected result is Negative.  Fact Sheet for Patients:  EntrepreneurPulse.com.au  Fact Sheet for Healthcare Providers:  IncredibleEmployment.be  This test is no t yet approved or cleared by the [place] FDA and  has been authorized for detection and/or diagnosis of SARS-CoV-2 by FDA under an Emergency Use Authorization (EUA). This EUA will remain  in effect (meaning this test can be used) for the duration of the COVID-19 declaration under Section 564(b)(1) of the Act, 21 U.S.C.section 360bbb-3(b)(1), unless the authorization is terminated  or revoked sooner.       Influenza A by PCR NEGATIVE NEGATIVE Final   Influenza B by PCR NEGATIVE NEGATIVE Final    Comment: (NOTE) The Xpert Xpress SARS-CoV-2/FLU/RSV plus assay is intended as an aid in the diagnosis of influenza from Nasopharyngeal swab specimens and should not be used as a sole basis for treatment. Nasal washings and aspirates are unacceptable for Xpert Xpress  SARS-CoV-2/FLU/RSV testing.  Fact Sheet for Patients: EntrepreneurPulse.com.au  Fact Sheet for Healthcare Providers: IncredibleEmployment.be  This test is not yet approved or cleared by the [place] FDA and has been authorized for detection and/or diagnosis of SARS-CoV-2 by FDA under an Emergency Use Authorization (EUA). This EUA will remain in effect (meaning this test can be used) for the duration of the COVID-19 declaration under Section 564(b)(1) of the Act, 21 U.S.C. section 360bbb-3(b)(1), unless the authorization is terminated or revoked.  Performed at [facility], [place] [address], [place], [place] [address]   Culture, blood (routine x 2)     Status: None   Collection Time: 06/18/22  7:30 AM   Specimen: BLOOD  Result Value Ref Range Status   Specimen Description BLOOD SITE NOT SPECIFIED  Final   Special Requests   Final    BOTTLES DRAWN AEROBIC AND ANAEROBIC Blood Culture results may not be optimal due to an inadequate volume of blood received in culture bottles   Culture   Final    NO GROWTH 5 DAYS Performed at [facility], [place] [address] [place], [place] [address]    Report Status 06/23/2022 FINAL  Final  Culture, blood (routine x 2)     Status: None   Collection Time: 06/18/22  7:51 AM  Specimen: BLOOD  Result Value Ref Range Status   Specimen Description BLOOD SITE NOT SPECIFIED  Final   Special Requests   Final    BOTTLES DRAWN AEROBIC AND ANAEROBIC Blood Culture adequate volume   Culture   Final    NO GROWTH 5 DAYS Performed at [facility], [address] [place], [place] [address]    Report Status 06/23/2022 FINAL  Final  Aerobic/Anaerobic Culture w Gram Stain (surgical/deep wound)     Status: None   Collection Time: 06/18/22  3:04 PM   Specimen: PATH Other; Body Fluid  Result Value Ref Range Status   Specimen Description FLUID RIGHT THIGH  Final   Special Requests PT ON ANCEF  Final   Gram  Stain   Final    RARE WBC PRESENT, PREDOMINANTLY PMN FEW GRAM POSITIVE COCCI FEW GRAM NEGATIVE RODS Performed at [facility], [place] [address], [place], [place] [address]    Culture   Final    MODERATE STREPTOCOCCUS ANGINOSIS MIXED ANAEROBIC FLORA PRESENT.  CALL LAB IF FURTHER IID REQUIRED.    Report Status 06/21/2022 FINAL  Final   Organism ID, Bacteria STREPTOCOCCUS ANGINOSIS  Final      Susceptibility   Streptococcus anginosis - MIC*    PENICILLIN <=0.06 SENSITIVE Sensitive     CEFTRIAXONE <=0.12 SENSITIVE Sensitive     ERYTHROMYCIN 4 RESISTANT Resistant     LEVOFLOXACIN 0.5 SENSITIVE Sensitive     VANCOMYCIN 0.5 SENSITIVE Sensitive     * MODERATE STREPTOCOCCUS ANGINOSIS  MRSA Next Gen by PCR, Nasal     Status: None   Collection Time: 06/18/22  5:55 PM   Specimen: Nasal Mucosa; Nasal Swab  Result Value Ref Range Status   MRSA by PCR Next Gen NOT DETECTED NOT DETECTED Final    Comment: (NOTE) The GeneXpert MRSA Assay (FDA approved for NASAL specimens only), is one component of a comprehensive MRSA colonization surveillance program. It is not intended to diagnose MRSA infection nor to guide or monitor treatment for MRSA infections. Test performance is not FDA approved in patients less than 42 years old. Performed at [facility], [place] [address], [place], [place] [address]     RADIOLOGY STUDIES/RESULTS: No results found.   LOS: 7 days   Oren Binet, MD  [facility]    To contact the attending provider between 7A-7P or the covering provider during after hours 7P-7A, please log into the web site [email or web address] and access using universal [place] password for that web site. If you do not have the password, please call the hospital operator.  06/25/2022, 11:05 AM

## 2022-06-25 NOTE — Progress Notes (Signed)
5 Days Post-Op   Subjective/Chief Complaint: Pain at wound but otherwise ok   Objective: Vital signs in last 24 hours: Temp:  [97.9 F (36.6 C)-98.5 F (36.9 C)] 98.4 F (36.9 C) (12/09 0800) Pulse Rate:  [89-95] 91 (12/09 0800) Resp:  [15-20] 15 (12/09 0800) BP: (159-182)/(85-96) 176/89 (12/09 0800) SpO2:  [96 %-100 %] 100 % (12/09 0800) Last BM Date : 06/23/22  Intake/Output from previous day: 12/08 0701 - 12/09 0700 In: 440 [P.O.:440] Out: -  Intake/Output this shift: No intake/output data recorded.    Lab Gen:  Alert, NAD, pleasant Wound: dressing clean.  Wound examined and overall clean with no purulent/cloudy fluid.  No overt necrotic tissue in either wound.  Overall stable Psych: A&Ox3  Recent Labs    06/24/22 0346 06/25/22 0223  WBC 23.6* 22.5*  HGB 10.3* 11.4*  HCT 30.4* 33.1*  PLT 128* 125*   BMET Recent Labs    06/24/22 0346 06/25/22 0223  NA 138 143  K 3.9 4.1  CL 109 114*  CO2 18* 18*  GLUCOSE 102* 85  BUN 90* 88*  CREATININE 6.92* 6.50*  CALCIUM 7.6* 7.5*   PT/INR No results for input(s): "LABPROT", "INR" in the last 72 hours. ABG No results for input(s): "PHART", "HCO3" in the last 72 hours.  Invalid input(s): "PCO2", "PO2"  Studies/Results: No results found.  Anti-infectives: Anti-infectives (From admission, onward)    Start     Dose/Rate Route Frequency Ordered Stop   06/22/22 2200  amoxicillin-clavulanate (AUGMENTIN) 500-125 MG per tablet 1 tablet        1 tablet Oral Daily at bedtime 06/22/22 1356 06/29/22 2159   06/22/22 0600  ceFAZolin (ANCEF) IVPB 2g/100 mL premix  Status:  Discontinued        2 g 200 mL/hr over 30 Minutes Intravenous On call to O.R. 06/21/22 1829 06/21/22 1830   06/21/22 1700  Ampicillin-Sulbactam (UNASYN) 3 g in sodium chloride 0.9 % 100 mL IVPB  Status:  Discontinued        3 g 200 mL/hr over 30 Minutes Intravenous Every 12 hours 06/21/22 1435 06/22/22 1356   06/18/22 1900  linezolid (ZYVOX) IVPB 600  mg  Status:  Discontinued        600 mg 300 mL/hr over 60 Minutes Intravenous Every 12 hours 06/18/22 1702 06/22/22 1134   06/18/22 1715  meropenem (MERREM) 1 g in sodium chloride 0.9 % 100 mL IVPB  Status:  Discontinued        1 g 200 mL/hr over 30 Minutes Intravenous Every 12 hours 06/18/22 1709 06/21/22 1411   06/18/22 0745  piperacillin-tazobactam (ZOSYN) IVPB 3.375 g        3.375 g 100 mL/hr over 30 Minutes Intravenous  Once 06/18/22 0733 06/18/22 0835       Assessment/Plan: s/p Procedure(s): WOUND EXPLORATION OF RIGHT THIGH AND RIGHT GROIN WITH IRRIGATION AND DEBRIDEMENT (Right)  POD 7/5  s/p excisional debridement of NSTI of right groin/thigh, Dr. Toy Cookey, Dr. Mable Fill (ortho) - Cx w/ moderate strep anginosis w/ mixed anaerobic flora present. Cont abx therapy per ID (they recommend augmentin now)   - WBC down-trending 23. Her wound looks clean and there does not appear to be anything further that needs debridement. She is HDS without fever, tachycardia or hypotension. Monitor. - Do not anticipate a need to return to the OR at this point - Cont NS WD dressing changes to wound BID  - PT, fell twice yesterday apparently -change pain meds up to vicodin  and decrease scheduled tylenol   FEN - renal carb mod diet VTE - SCDs, subq heparin ID - Augmentin, per ID Foley - purewick   Acute on CKD - per nephrology  HTN DM Obesity OSA       LOS: 7 days    Turner Daniels MD  06/25/2022

## 2022-06-26 DIAGNOSIS — M729 Fibroblastic disorder, unspecified: Secondary | ICD-10-CM | POA: Diagnosis not present

## 2022-06-26 DIAGNOSIS — E1122 Type 2 diabetes mellitus with diabetic chronic kidney disease: Secondary | ICD-10-CM | POA: Diagnosis not present

## 2022-06-26 DIAGNOSIS — N170 Acute kidney failure with tubular necrosis: Secondary | ICD-10-CM | POA: Diagnosis not present

## 2022-06-26 DIAGNOSIS — A419 Sepsis, unspecified organism: Secondary | ICD-10-CM | POA: Diagnosis not present

## 2022-06-26 LAB — RENAL FUNCTION PANEL
Albumin: 1.9 g/dL — ABNORMAL LOW (ref 3.5–5.0)
Anion gap: 11 (ref 5–15)
BUN: 81 mg/dL — ABNORMAL HIGH (ref 6–20)
CO2: 18 mmol/L — ABNORMAL LOW (ref 22–32)
Calcium: 7.7 mg/dL — ABNORMAL LOW (ref 8.9–10.3)
Chloride: 115 mmol/L — ABNORMAL HIGH (ref 98–111)
Creatinine, Ser: 5.77 mg/dL — ABNORMAL HIGH (ref 0.44–1.00)
GFR, Estimated: 9 mL/min — ABNORMAL LOW (ref >60)
Glucose, Bld: 147 mg/dL — ABNORMAL HIGH (ref 70–99)
Phosphorus: 5.8 mg/dL — ABNORMAL HIGH (ref 2.5–4.6)
Potassium: 4 mmol/L (ref 3.5–5.1)
Sodium: 144 mmol/L (ref 135–145)

## 2022-06-26 LAB — CBC
HCT: 32.8 % — ABNORMAL LOW (ref 36.0–46.0)
Hemoglobin: 10.8 g/dL — ABNORMAL LOW (ref 12.0–15.0)
MCH: 30 pg (ref 26.0–34.0)
MCHC: 32.9 g/dL (ref 30.0–36.0)
MCV: 91.1 fL (ref 80.0–100.0)
Platelets: 110 10*3/uL — ABNORMAL LOW (ref 150–400)
RBC: 3.6 MIL/uL — ABNORMAL LOW (ref 3.87–5.11)
RDW: 14.3 % (ref 11.5–15.5)
WBC: 24.5 10*3/uL — ABNORMAL HIGH (ref 4.0–10.5)
nRBC: 0.2 % (ref 0.0–0.2)

## 2022-06-26 LAB — GLUCOSE, CAPILLARY
Glucose-Capillary: 137 mg/dL — ABNORMAL HIGH (ref 70–99)
Glucose-Capillary: 160 mg/dL — ABNORMAL HIGH (ref 70–99)

## 2022-06-26 MED ORDER — AMLODIPINE BESYLATE 5 MG PO TABS
5.0000 mg | ORAL_TABLET | Freq: Once | ORAL | Status: AC
  Administered 2022-06-26: 5 mg via ORAL
  Filled 2022-06-26: qty 1

## 2022-06-26 MED ORDER — AMLODIPINE BESYLATE 10 MG PO TABS
10.0000 mg | ORAL_TABLET | Freq: Every day | ORAL | Status: DC
  Administered 2022-06-27 – 2022-06-29 (×3): 10 mg via ORAL
  Filled 2022-06-26 (×4): qty 1

## 2022-06-26 NOTE — Plan of Care (Signed)

## 2022-06-26 NOTE — Progress Notes (Signed)
Subjective:  UOP not well recorded -  BUN and crt made real improvement from yesterday -  she overall says she is feeling better-  she actually does look a little better today -  no N/V  Objective Vital signs in last 24 hours: Vitals:   06/25/22 1926 06/26/22 0000 06/26/22 0400 06/26/22 0823  BP: (!) 157/80 (!) 157/88 (!) 170/92 (!) 178/94  Pulse: 98 99 91 93  Resp: 16 16 16 18   Temp: 98.8 F (37.1 C) 98.2 F (36.8 C)    TempSrc: Oral Oral    SpO2: 97% 95% 99% 97%  Weight:      Height:       Weight change:  No intake or output data in the 24 hours ending 06/26/22 1359    Assessment/Plan: 41 year old black female with progressive CKD as outpatient followed by [facility] with baseline creatinine in the fours.  She now presents with necrotizing fasciitis requiring incision and debridement x2.  She has had some worsening of renal function in this setting 1.Renal-baseline CKD that is been progressive as outpatient.  Presumably secondary to diabetes and hypertension.  She has had a  decrease in her GFR in the setting of necrotizing fasciitis and debridements.  Is going to be really difficult to tell if she is having symptoms related to uremia or related to her current medical situation.  She reports feeling really well a week prior to admission.  There are no absolute acute indications for dialysis at this time.  What we need to do is just follow her kidney function as well as her clinical improvement trajectory.  If she does not improve we could possibly say that uremia is the culprit.  In that case, initiation of dialysis might be warranted.  Patient seems to understand the concepts-  so far is OK -  crt worse but a BUN of 90 does not usually give uremic sxms in a 41 year old.  Continue to watch -- first movement in the right direction today  2. Hypertension/volume  -normally on antihypertensives as outpatient.  Right now blood pressure is higher on no meds-  agree with starting norvasc.   Volume status seems may be just a little heavy but close to euvolemic.  3.  Necrotizing fasciitis-status post debridements x2, per surgery-also on antibiotics 4.  Metabolic acidosis-  oral serum bicarbonate 5. Anemia  -due to CKD and surgery-we will give ESA but hold off on iron right now 6.  Bones-PTH 415-  started some calcitriol-  Phos high-  will keep in mind but wont start binders right now due to GI issues -      Louis Meckel    Labs: Basic Metabolic Panel: Recent Labs  Lab 06/24/22 0346 06/25/22 0223 06/26/22 1150  NA 138 143 144  K 3.9 4.1 4.0  CL 109 114* 115*  CO2 18* 18* 18*  GLUCOSE 102* 85 147*  BUN 90* 88* 81*  CREATININE 6.92* 6.50* 5.77*  CALCIUM 7.6* 7.5* 7.7*  PHOS 7.0* 5.9* 5.8*   Liver Function Tests: Recent Labs  Lab 06/24/22 0346 06/25/22 0223 06/26/22 1150  ALBUMIN 1.5* 1.7* 1.9*   No results for input(s): "LIPASE", "AMYLASE" in the last 168 hours. No results for input(s): "AMMONIA" in the last 168 hours. CBC: Recent Labs  Lab 06/22/22 0415 06/23/22 0811 06/24/22 0346 06/25/22 0223 06/26/22 1150  WBC 34.7* 25.0* 23.6* 22.5* 24.5*  HGB 10.9* 10.5* 10.3* 11.4* 10.8*  HCT 32.0* 31.9* 30.4* 33.1* 32.8*  MCV 88.6 89.6 89.9 89.0 91.1  PLT 198 167 128* 125* 110*   Cardiac Enzymes: No results for input(s): "CKTOTAL", "CKMB", "CKMBINDEX", "TROPONINI" in the last 168 hours. CBG: Recent Labs  Lab 06/24/22 2057 06/25/22 0741 06/25/22 1139 06/25/22 1613 06/25/22 2125  GLUCAP 88 82 78 97 85    Iron Studies: No results for input(s): "IRON", "TIBC", "TRANSFERRIN", "FERRITIN" in the last 72 hours. Studies/Results: No results found. Medications: Infusions:    Scheduled Medications:  (feeding supplement) PROSource Plus  30 mL Oral BID BM   acetaminophen  325 mg Oral Q6H   [START ON 06/27/2022] amLODipine  10 mg Oral Daily   amoxicillin-clavulanate  1 tablet Oral QHS   calcitRIOL  0.25 mcg Oral Daily   Chlorhexidine Gluconate  Cloth  6 each Topical Daily   darbepoetin (ARANESP) injection - NON-DIALYSIS  100 mcg Subcutaneous Q Mon-1800   feeding supplement  237 mL Oral BID BM   heparin injection (subcutaneous)  5,000 Units Subcutaneous Q8H   insulin aspart  0-5 Units Subcutaneous QHS   nystatin   Topical TID   pantoprazole  40 mg Oral QHS   sertraline  100 mg Oral QHS   sodium bicarbonate  1,300 mg Oral BID    have reviewed scheduled and prn medications.  Physical Exam: General: alert, NAD Heart: RRR Lungs: mostly clear Abdomen: obese, post op Extremities: trace edema    06/26/2022,1:59 PM  LOS: 8 days

## 2022-06-26 NOTE — Progress Notes (Signed)
PROGRESS NOTE        PATIENT DETAILS Name: Jacqueline [name] Last Age: 41 y.o. Sex: female Date of Birth: 1980-08-09 Admit Date: 06/18/2022 Admitting Physician Arta Bruce Kinsinger, MD [id number], Larey Dresser, MD  Brief Summary: Patient is a 41 y.o.  female with history of CKD stage IV, DM-2, HTN, s/p gastric bypass 2020-presenting with severe sepsis due to necrotizing fasciitis of right thigh/groin-evaluated by orthopedics/general surgery-and underwent right thigh I&D/debridement-and was subsequently admitted by PCCM postoperatively.  She was stabilized and subsequently transferred to [facility] on 12/5.  Significant events: 12/2>> presenting with right thigh necrotizing fasciitis-s/p debridement by Ortho/general surgery-admit to ICU postoperatively. 12/4>> irrigation/debridement by general surgery 12/5>> transferred to [facility].  Significant studies: 12/2>> CT abdomen/pelvis: Findings concerning for necrotizing fasciitis in the right thigh.  Probable right psoas muscle abscess. 12/2>> CT of the right lower extremity: Necrotizing fasciitis/infectious myositis of right thigh.  Significant microbiology data: 12/2>> COVID/influenza PCR: Negative 12/2>> right thigh tissue: Streptococcus anginosus/mixed anaerobic flora. 12/2>> blood culture: No growth  Procedures: 12/2>> right thigh I&D by Ortho, advised pelvis I&D by general surgery 12/4>> right thigh/groin irrigation and debridement  Consults: Orthopedics General surgery Infectious disease Nephrology PCCM  Subjective: No vomiting yesterday. Continues to have severe pain requiring IV Dilaudid at the surgical site.  Objective: Vitals: Blood pressure (!) 178/94, pulse 93, temperature 98.2 F (36.8 C), temperature source Oral, resp. rate 18, height 5\' 5"  (1.651 m), weight 91.5 kg, last menstrual period 06/15/2022, SpO2 97 %.   Exam: Gen Exam:Alert awake-not in any distress HEENT:atraumatic, normocephalic Chest: B/L  clear to auscultation anteriorly CVS:S1S2 regular Abdomen:soft non tender, non distended Extremities:no edema Neurology: Non focal Skin: no rash  Pertinent Labs/Radiology:    Latest Ref Rng & Units 06/25/2022    2:23 AM 06/24/2022    3:46 AM 06/23/2022    8:11 AM  CBC  WBC 4.0 - 10.5 K/uL 22.5  23.6  25.0   Hemoglobin 12.0 - 15.0 g/dL 11.4  10.3  10.5   Hematocrit 36.0 - 46.0 % 33.1  30.4  31.9   Platelets 150 - 400 K/uL 125  128  167     Lab Results  Component Value Date   NA 143 06/25/2022   K 4.1 06/25/2022   CL 114 (H) 06/25/2022   CO2 18 (L) 06/25/2022      Assessment/Plan: Severe sepsis due to necrotizing fasciitis involving right thigh/pelvis Sepsis physiology has resolved-s/p I&D on 12/2 and 12/4 Leukocytosis slowly downtrending Per general surgery-surgical site looks clean without any evidence of infection  On Augmentin  Continues to have significant pain-on as needed narcotics.   AKI on CKD 4 AKI-likely due to ischemic ATN in the setting of sepsis physiology Awaiting labs this morning  Nausea/vomiting better-thought to be from narcotics/post surgical issues rather than uremia.   Plan is to follow renal function Nephrology following.  Hyperkalemia Resolved  Metabolic acidosis Due to CKD On oral bicarb supplementation  Normocytic anemia Due to a combination of anemia due to CKD and acute illness. Darbepoetin/iron defer to nephrology service Follow CBC  HTN BP still elevated Increase amlodipine Follow/optimize  DM-2 (A1c 5.4 on 12/5) with hyperglycemia Not on any medications at home Monitor closely on extra sensitive SSI  Recent Labs    06/25/22 1139 06/25/22 1613 06/25/22 2125  GLUCAP 78 97 85     Hyponatremia Mild Follow-up  for now  Depression Stable Zoloft  BMI: Estimated body mass index is 33.57 kg/m as calculated from the following:   Height as of this encounter: 5\' 5"  (1.651 m).   Weight as of this encounter: 91.5 kg.    Code status:   Code Status: Prior   DVT Prophylaxis: heparin injection 5,000 Units Start: 06/18/22 1715   Family Communication: None at bedside.   Disposition Plan: Status is: Inpatient Remains inpatient appropriate because: Severity of illness   Planned Discharge Destination: Home health versus SNF   Diet: Diet Order             Diet regular Room service appropriate? Yes; Fluid consistency: Thin  Diet effective now                     Antimicrobial agents: Anti-infectives (From admission, onward)    Start     Dose/Rate Route Frequency Ordered Stop   06/22/22 2200  amoxicillin-clavulanate (AUGMENTIN) 500-125 MG per tablet 1 tablet        1 tablet Oral Daily at bedtime 06/22/22 1356 06/29/22 2159   06/22/22 0600  ceFAZolin (ANCEF) IVPB 2g/100 mL premix  Status:  Discontinued        2 g 200 mL/hr over 30 Minutes Intravenous On call to O.R. 06/21/22 1829 06/21/22 1830   06/21/22 1700  Ampicillin-Sulbactam (UNASYN) 3 g in sodium chloride 0.9 % 100 mL IVPB  Status:  Discontinued        3 g 200 mL/hr over 30 Minutes Intravenous Every 12 hours 06/21/22 1435 06/22/22 1356   06/18/22 1900  linezolid (ZYVOX) IVPB 600 mg  Status:  Discontinued        600 mg 300 mL/hr over 60 Minutes Intravenous Every 12 hours 06/18/22 1702 06/22/22 1134   06/18/22 1715  meropenem (MERREM) 1 g in sodium chloride 0.9 % 100 mL IVPB  Status:  Discontinued        1 g 200 mL/hr over 30 Minutes Intravenous Every 12 hours 06/18/22 1709 06/21/22 1411   06/18/22 0745  piperacillin-tazobactam (ZOSYN) IVPB 3.375 g        3.375 g 100 mL/hr over 30 Minutes Intravenous  Once 06/18/22 0733 06/18/22 0835        MEDICATIONS: Scheduled Meds:  (feeding supplement) PROSource Plus  30 mL Oral BID BM   acetaminophen  325 mg Oral Q6H   amLODipine  5 mg Oral Daily   amoxicillin-clavulanate  1 tablet Oral QHS   calcitRIOL  0.25 mcg Oral Daily   Chlorhexidine Gluconate Cloth  6 each Topical Daily    darbepoetin (ARANESP) injection - NON-DIALYSIS  100 mcg Subcutaneous Q Mon-1800   feeding supplement  237 mL Oral BID BM   heparin injection (subcutaneous)  5,000 Units Subcutaneous Q8H   insulin aspart  0-5 Units Subcutaneous QHS   nystatin   Topical TID   pantoprazole  40 mg Oral QHS   sertraline  100 mg Oral QHS   sodium bicarbonate  1,300 mg Oral BID   Continuous Infusions:   PRN Meds:.HYDROcodone-acetaminophen, HYDROmorphone (DILAUDID) injection, ondansetron (ZOFRAN) IV, phenol   I have personally reviewed following labs and imaging studies  LABORATORY DATA: CBC: Recent Labs  Lab 06/21/22 0531 06/22/22 0415 06/23/22 0811 06/24/22 0346 06/25/22 0223  WBC 35.4* 34.7* 25.0* 23.6* 22.5*  HGB 11.3* 10.9* 10.5* 10.3* 11.4*  HCT 32.6* 32.0* 31.9* 30.4* 33.1*  MCV 88.1 88.6 89.6 89.9 89.0  PLT 241 198 167 128* 125*  Basic Metabolic Panel: Recent Labs  Lab 06/20/22 0448 06/20/22 1725 06/21/22 0531 06/21/22 0532 06/22/22 0415 06/23/22 0811 06/24/22 0346 06/25/22 0223  NA 134*   < >  --  134* 132* 139 138 143  K 4.3   < >  --  4.4 3.8 4.0 3.9 4.1  CL 107   < >  --  106 104 111 109 114*  CO2 15*   < >  --  16* 15* 17* 18* 18*  GLUCOSE 108*   < >  --  199* 143* 69* 102* 85  BUN 74*   < >  --  79* 87* 86* 90* 88*  CREATININE 5.55*   < >  --  6.03* 6.62* 6.52* 6.92* 6.50*  CALCIUM 6.5*   < >  --  7.3* 6.9* 7.6* 7.6* 7.5*  MG 1.8  --  2.0  --   --   --   --   --   PHOS 6.2*  --  7.1* 7.2* 6.6* 6.1* 7.0* 5.9*   < > = values in this interval not displayed.     GFR: Estimated Creatinine Clearance: 12.6 mL/min (A) (by C-G formula based on SCr of 6.5 mg/dL (H)).  Liver Function Tests: Recent Labs  Lab 06/21/22 0532 06/22/22 0415 06/23/22 0811 06/24/22 0346 06/25/22 0223  ALBUMIN 1.6* <1.5* 1.5* 1.5* 1.7*    No results for input(s): "LIPASE", "AMYLASE" in the last 168 hours. No results for input(s): "AMMONIA" in the last 168 hours.  Coagulation  Profile: No results for input(s): "INR", "PROTIME" in the last 168 hours.  Cardiac Enzymes: No results for input(s): "CKTOTAL", "CKMB", "CKMBINDEX", "TROPONINI" in the last 168 hours.  BNP (last 3 results) No results for input(s): "PROBNP" in the last 8760 hours.  Lipid Profile: No results for input(s): "CHOL", "HDL", "LDLCALC", "TRIG", "CHOLHDL", "LDLDIRECT" in the last 72 hours.  Thyroid Function Tests: No results for input(s): "TSH", "T4TOTAL", "FREET4", "T3FREE", "THYROIDAB" in the last 72 hours.  Anemia Panel: No results for input(s): "VITAMINB12", "FOLATE", "FERRITIN", "TIBC", "IRON", "RETICCTPCT" in the last 72 hours.  Urine analysis:    Component Value Date/Time   COLORURINE YELLOW 06/18/2022 0820   APPEARANCEUR HAZY (A) 06/18/2022 0820   LABSPEC 1.013 06/18/2022 0820   PHURINE 7.0 06/18/2022 0820   GLUCOSEU 50 (A) 06/18/2022 0820   HGBUR NEGATIVE 06/18/2022 0820   BILIRUBINUR NEGATIVE 06/18/2022 0820   KETONESUR NEGATIVE 06/18/2022 0820   PROTEINUR >=300 (A) 06/18/2022 0820   UROBILINOGEN 0.2 08/21/2012 2130   NITRITE NEGATIVE 06/18/2022 0820   LEUKOCYTESUR TRACE (A) 06/18/2022 0820    Sepsis Labs: Lactic Acid, Venous    Component Value Date/Time   LATICACIDVEN 2.4 (HH) 06/18/2022 1755    MICROBIOLOGY: Recent Results (from the past 240 hour(s))  Resp Panel by RT-PCR (Flu A&B, Covid) Anterior Nasal Swab     Status: None   Collection Time: 06/18/22  3:31 AM   Specimen: Anterior Nasal Swab  Result Value Ref Range Status   SARS Coronavirus 2 by RT PCR NEGATIVE NEGATIVE Final    Comment: (NOTE) SARS-CoV-2 target nucleic acids are NOT DETECTED.  The SARS-CoV-2 RNA is generally detectable in upper respiratory specimens during the acute phase of infection. The lowest concentration of SARS-CoV-2 viral copies this assay can detect is 138 copies/mL. A negative result does not preclude SARS-Cov-2 infection and should not be used as the sole basis for treatment  or other patient management decisions. A negative result may occur with  improper specimen  collection/handling, submission of specimen other than nasopharyngeal swab, presence of viral mutation(s) within the areas targeted by this assay, and inadequate number of viral copies(<138 copies/mL). A negative result must be combined with clinical observations, patient history, and epidemiological information. The expected result is Negative.  Fact Sheet for Patients:  EntrepreneurPulse.com.au  Fact Sheet for Healthcare Providers:  IncredibleEmployment.be  This test is no t yet approved or cleared by the [place] FDA and  has been authorized for detection and/or diagnosis of SARS-CoV-2 by FDA under an Emergency Use Authorization (EUA). This EUA will remain  in effect (meaning this test can be used) for the duration of the COVID-19 declaration under Section 564(b)(1) of the Act, 21 U.S.C.section 360bbb-3(b)(1), unless the authorization is terminated  or revoked sooner.       Influenza A by PCR NEGATIVE NEGATIVE Final   Influenza B by PCR NEGATIVE NEGATIVE Final    Comment: (NOTE) The Xpert Xpress SARS-CoV-2/FLU/RSV plus assay is intended as an aid in the diagnosis of influenza from Nasopharyngeal swab specimens and should not be used as a sole basis for treatment. Nasal washings and aspirates are unacceptable for Xpert Xpress SARS-CoV-2/FLU/RSV testing.  Fact Sheet for Patients: EntrepreneurPulse.com.au  Fact Sheet for Healthcare Providers: IncredibleEmployment.be  This test is not yet approved or cleared by the [place] FDA and has been authorized for detection and/or diagnosis of SARS-CoV-2 by FDA under an Emergency Use Authorization (EUA). This EUA will remain in effect (meaning this test can be used) for the duration of the COVID-19 declaration under Section 564(b)(1) of the Act, 21 U.S.C. section  360bbb-3(b)(1), unless the authorization is terminated or revoked.  Performed at [facility], [place] [address], [place], [place] [address]   Culture, blood (routine x 2)     Status: None   Collection Time: 06/18/22  7:30 AM   Specimen: BLOOD  Result Value Ref Range Status   Specimen Description BLOOD SITE NOT SPECIFIED  Final   Special Requests   Final    BOTTLES DRAWN AEROBIC AND ANAEROBIC Blood Culture results may not be optimal due to an inadequate volume of blood received in culture bottles   Culture   Final    NO GROWTH 5 DAYS Performed at [facility], [place] [address], [place], [place] [address]    Report Status 06/23/2022 FINAL  Final  Culture, blood (routine x 2)     Status: None   Collection Time: 06/18/22  7:51 AM   Specimen: BLOOD  Result Value Ref Range Status   Specimen Description BLOOD SITE NOT SPECIFIED  Final   Special Requests   Final    BOTTLES DRAWN AEROBIC AND ANAEROBIC Blood Culture adequate volume   Culture   Final    NO GROWTH 5 DAYS Performed at [facility], [place] [address], [place], [place] [address]    Report Status 06/23/2022 FINAL  Final  Aerobic/Anaerobic Culture w Gram Stain (surgical/deep wound)     Status: None   Collection Time: 06/18/22  3:04 PM   Specimen: PATH Other; Body Fluid  Result Value Ref Range Status   Specimen Description FLUID RIGHT THIGH  Final   Special Requests PT ON ANCEF  Final   Gram Stain   Final    RARE WBC PRESENT, PREDOMINANTLY PMN FEW GRAM POSITIVE COCCI FEW GRAM NEGATIVE RODS Performed at [facility], [place] [address] [place], [place] [address]    Culture   Final    MODERATE  STREPTOCOCCUS ANGINOSIS MIXED ANAEROBIC FLORA PRESENT.  CALL LAB IF FURTHER IID REQUIRED.    Report Status 06/21/2022 FINAL  Final   Organism ID, Bacteria STREPTOCOCCUS ANGINOSIS  Final      Susceptibility   Streptococcus anginosis - MIC*    PENICILLIN <=0.06 SENSITIVE Sensitive     CEFTRIAXONE <=0.12 SENSITIVE  Sensitive     ERYTHROMYCIN 4 RESISTANT Resistant     LEVOFLOXACIN 0.5 SENSITIVE Sensitive     VANCOMYCIN 0.5 SENSITIVE Sensitive     * MODERATE STREPTOCOCCUS ANGINOSIS  MRSA Next Gen by PCR, Nasal     Status: None   Collection Time: 06/18/22  5:55 PM   Specimen: Nasal Mucosa; Nasal Swab  Result Value Ref Range Status   MRSA by PCR Next Gen NOT DETECTED NOT DETECTED Final    Comment: (NOTE) The GeneXpert MRSA Assay (FDA approved for NASAL specimens only), is one component of a comprehensive MRSA colonization surveillance program. It is not intended to diagnose MRSA infection nor to guide or monitor treatment for MRSA infections. Test performance is not FDA approved in patients less than 69 years old. Performed at [facility], [place] [address] [place], [place] [address]     RADIOLOGY STUDIES/RESULTS: No results found.   LOS: 8 days   Oren Binet, MD  [facility]    To contact the attending provider between 7A-7P or the covering provider during after hours 7P-7A, please log into the web site [email or web address] and access using universal [place] password for that web site. If you do not have the password, please call the hospital operator.  06/26/2022, 10:30 AM

## 2022-06-27 LAB — CBC WITH DIFFERENTIAL/PLATELET
Abs Immature Granulocytes: 0.9 10*3/uL — ABNORMAL HIGH (ref 0.00–0.07)
Basophils Absolute: 0.1 10*3/uL (ref 0.0–0.1)
Basophils Relative: 0 %
Eosinophils Absolute: 0.2 10*3/uL (ref 0.0–0.5)
Eosinophils Relative: 1 %
HCT: 29.6 % — ABNORMAL LOW (ref 36.0–46.0)
Hemoglobin: 9.9 g/dL — ABNORMAL LOW (ref 12.0–15.0)
Immature Granulocytes: 4 %
Lymphocytes Relative: 11 %
Lymphs Abs: 2.4 10*3/uL (ref 0.7–4.0)
MCH: 30.1 pg (ref 26.0–34.0)
MCHC: 33.4 g/dL (ref 30.0–36.0)
MCV: 90 fL (ref 80.0–100.0)
Monocytes Absolute: 0.9 10*3/uL (ref 0.1–1.0)
Monocytes Relative: 4 %
Neutro Abs: 17.6 10*3/uL — ABNORMAL HIGH (ref 1.7–7.7)
Neutrophils Relative %: 80 %
Platelets: 115 10*3/uL — ABNORMAL LOW (ref 150–400)
RBC: 3.29 MIL/uL — ABNORMAL LOW (ref 3.87–5.11)
RDW: 14.2 % (ref 11.5–15.5)
WBC: 22 10*3/uL — ABNORMAL HIGH (ref 4.0–10.5)
nRBC: 0.4 % — ABNORMAL HIGH (ref 0.0–0.2)

## 2022-06-27 LAB — RENAL FUNCTION PANEL
Albumin: 1.8 g/dL — ABNORMAL LOW (ref 3.5–5.0)
Anion gap: 11 (ref 5–15)
BUN: 75 mg/dL — ABNORMAL HIGH (ref 6–20)
CO2: 20 mmol/L — ABNORMAL LOW (ref 22–32)
Calcium: 7.6 mg/dL — ABNORMAL LOW (ref 8.9–10.3)
Chloride: 113 mmol/L — ABNORMAL HIGH (ref 98–111)
Creatinine, Ser: 5.5 mg/dL — ABNORMAL HIGH (ref 0.44–1.00)
GFR, Estimated: 9 mL/min — ABNORMAL LOW (ref >60)
Glucose, Bld: 168 mg/dL — ABNORMAL HIGH (ref 70–99)
Phosphorus: 5.6 mg/dL — ABNORMAL HIGH (ref 2.5–4.6)
Potassium: 3.9 mmol/L (ref 3.5–5.1)
Sodium: 144 mmol/L (ref 135–145)

## 2022-06-27 LAB — GLUCOSE, CAPILLARY
Glucose-Capillary: 159 mg/dL — ABNORMAL HIGH (ref 70–99)
Glucose-Capillary: 139 mg/dL — ABNORMAL HIGH (ref 70–99)
Glucose-Capillary: 170 mg/dL — ABNORMAL HIGH (ref 70–99)
Glucose-Capillary: 162 mg/dL — ABNORMAL HIGH (ref 70–99)

## 2022-06-27 MED ORDER — HYDROMORPHONE HCL 1 MG/ML IJ SOLN
0.5000 mg | INTRAMUSCULAR | Status: DC | PRN
  Filled 2022-06-27: qty 1

## 2022-06-27 MED ORDER — HYDROMORPHONE HCL 1 MG/ML IJ SOLN
0.5000 mg | INTRAMUSCULAR | Status: DC | PRN
  Administered 2022-06-27 – 2022-06-29 (×7): 1 mg via INTRAVENOUS
  Filled 2022-06-27 (×6): qty 1

## 2022-06-27 MED ORDER — HYDROCODONE-ACETAMINOPHEN 5-325 MG PO TABS: 2.0000 | ORAL_TABLET | ORAL | Status: DC | PRN

## 2022-06-27 MED ORDER — HYDROCODONE-ACETAMINOPHEN 10-325 MG PO TABS: 2.0000 | ORAL_TABLET | Freq: Four times a day (QID) | ORAL | Status: DC | PRN

## 2022-06-27 MED ORDER — HYDROCODONE-ACETAMINOPHEN 5-325 MG PO TABS
1.0000 | ORAL_TABLET | ORAL | Status: DC | PRN
  Administered 2022-06-27 – 2022-06-29 (×7): 2 via ORAL
  Filled 2022-06-27 (×7): qty 2

## 2022-06-27 MED ORDER — OXYCODONE HCL 5 MG PO TABS: 5.0000 mg | ORAL_TABLET | ORAL | Status: DC | PRN

## 2022-06-27 MED ORDER — GABAPENTIN 100 MG PO CAPS
100.0000 mg | ORAL_CAPSULE | Freq: Three times a day (TID) | ORAL | Status: DC
  Administered 2022-06-27 – 2022-06-29 (×8): 100 mg via ORAL
  Filled 2022-06-27 (×8): qty 1

## 2022-06-27 MED ORDER — ACETAMINOPHEN 325 MG PO TABS
325.0000 mg | ORAL_TABLET | Freq: Four times a day (QID) | ORAL | Status: DC
  Administered 2022-06-27 – 2022-06-29 (×9): 325 mg via ORAL
  Filled 2022-06-27 (×9): qty 1

## 2022-06-27 MED ORDER — FLUCONAZOLE 100MG IVPB
100.0000 mg | INTRAVENOUS | Status: DC
  Administered 2022-06-27 – 2022-06-29 (×3): 100 mg via INTRAVENOUS
  Filled 2022-06-27 (×3): qty 50

## 2022-06-27 MED ORDER — ACETAMINOPHEN 500 MG PO TABS: 1000.0000 mg | ORAL_TABLET | Freq: Four times a day (QID) | ORAL | Status: DC

## 2022-06-27 NOTE — Progress Notes (Addendum)
7 Days Post-Op  Subjective: Still taking mostly dilaudid for pain control. Reports poor PO intake due to nausea as well as throat pain. States she scraped white stuff off of her tongue and cheeks last night. Also expressed concern about pain/wound under her breast. Dressing just changed.  Patients mother is at bedside  Objective: Vital signs in last 24 hours: Temp:  [98 F (36.7 C)-98.4 F (36.9 C)] 98.2 F (36.8 C) (12/11 0811) Pulse Rate:  [87-102] 94 (12/11 0811) Resp:  [14-20] 20 (12/11 0811) BP: (150-171)/(79-90) 171/90 (12/11 0811) SpO2:  [96 %-98 %] 98 % (12/11 0811) Last BM Date : 06/26/22  Intake/Output from previous day: 12/10 0701 - 12/11 0700 In: 360 [P.O.:360] Out: 300 [Urine:300] Intake/Output this shift: No intake/output data recorded.  PE: Gen:  Alert, NAD, pleasant Chest wall: thin area of hyperpigmentation, almost looks like ecchymosis, under each breast small open wound under R breast.no cellulitis or purulence.  Wound: groin dressing clean.  Psych: A&Ox3   Lab Results:  Recent Labs    06/26/22 1150 06/27/22 0307  WBC 24.5* 22.0*  HGB 10.8* 9.9*  HCT 32.8* 29.6*  PLT 110* 115*   BMET Recent Labs    06/26/22 1150 06/27/22 0307  NA 144 144  K 4.0 3.9  CL 115* 113*  CO2 18* 20*  GLUCOSE 147* 168*  BUN 81* 75*  CREATININE 5.77* 5.50*  CALCIUM 7.7* 7.6*   PT/INR No results for input(s): "LABPROT", "INR" in the last 72 hours. CMP     Component Value Date/Time   NA 144 06/27/2022 0307   K 3.9 06/27/2022 0307   CL 113 (H) 06/27/2022 0307   CO2 20 (L) 06/27/2022 0307   GLUCOSE 168 (H) 06/27/2022 0307   BUN 75 (H) 06/27/2022 0307   CREATININE 5.50 (H) 06/27/2022 0307   CALCIUM 7.6 (L) 06/27/2022 0307   PROT 5.7 (L) 06/19/2022 0056   ALBUMIN 1.8 (L) 06/27/2022 0307   AST 39 06/19/2022 0056   ALT 24 06/19/2022 0056   ALKPHOS 65 06/19/2022 0056   BILITOT 1.6 (H) 06/19/2022 0056   GFRNONAA 9 (L) 06/27/2022 0307   GFRAA 31 (L)  12/03/2019 0345   Lipase     Component Value Date/Time   LIPASE 40 08/27/2021 0811    Studies/Results: No results found.  Anti-infectives: Anti-infectives (From admission, onward)    Start     Dose/Rate Route Frequency Ordered Stop   06/22/22 2200  amoxicillin-clavulanate (AUGMENTIN) 500-125 MG per tablet 1 tablet        1 tablet Oral Daily at bedtime 06/22/22 1356 06/29/22 2159   06/22/22 0600  ceFAZolin (ANCEF) IVPB 2g/100 mL premix  Status:  Discontinued        2 g 200 mL/hr over 30 Minutes Intravenous On call to O.R. 06/21/22 1829 06/21/22 1830   06/21/22 1700  Ampicillin-Sulbactam (UNASYN) 3 g in sodium chloride 0.9 % 100 mL IVPB  Status:  Discontinued        3 g 200 mL/hr over 30 Minutes Intravenous Every 12 hours 06/21/22 1435 06/22/22 1356   06/18/22 1900  linezolid (ZYVOX) IVPB 600 mg  Status:  Discontinued        600 mg 300 mL/hr over 60 Minutes Intravenous Every 12 hours 06/18/22 1702 06/22/22 1134   06/18/22 1715  meropenem (MERREM) 1 g in sodium chloride 0.9 % 100 mL IVPB  Status:  Discontinued        1 g 200 mL/hr over 30 Minutes Intravenous  Every 12 hours 06/18/22 1709 06/21/22 1411   06/18/22 0745  piperacillin-tazobactam (ZOSYN) IVPB 3.375 g        3.375 g 100 mL/hr over 30 Minutes Intravenous  Once 06/18/22 0733 06/18/22 0835        Assessment/Plan POD 9/7, s/p excisional debridement of NSTI of right groin/thigh, Dr. Toy Cookey, Dr. Mable Fill (ortho) - Cx w/ moderate strep anginosis w/ mixed anaerobic flora present. Cont abx therapy per ID (they recommend augmentin now)  - WBC down-trending 22. Her wound looks clean and there does not appear to be anything further that needs debridement. She is HDS without fever, tachycardia or hypotension. Monitor. - Do not anticipate a need to return to the OR at this point - Cont NS WD dressing changes to wound BID  - PT, fell twice yesterday apparently - increase Vicodin to 10-15 mg q6h PRN and stop scheduled  tylenol, add gabapentin 100 mg TID, decrease dilaudid - will try to coordinate wound check tomorrow AM with RN.   Inter-dry ordered for inframammary folds   Concern for oral candida - discussed with primary team, may need systemic antifungal therapy.   FEN - renal carb mod diet VTE - SCDs, subq heparin ID - Augmentin, per ID Foley - purewick  Acute on CKD - per nephrology  HTN DM Obesity OSA     LOS: 9 days    Jill Alexanders , [facility] 06/27/2022, 10:34 AM Please see Amion for pager number during day hours 7:00am-4:30pm

## 2022-06-27 NOTE — Progress Notes (Signed)
Admit: 06/18/2022 LOS: 9  89F with AKI on CKD4 in setting of RLE necrotizing fasciitis  Subjective:  SCr down a tad 5.8 to 5.5, K 3.9, HCO3 20.  SAlb 1.8 UOP not well recorded On RA BPs midly elevated on CCB Meds include: CCB, gabapentin, NaHCo3, ESA No c/o; not eating much, trying to drink as able  12/10 0701 - 12/11 0700 In: 360 [P.O.:360] Out: 300 [Urine:300]  Filed Weights   06/18/22 1343 06/18/22 1730  Weight: 82.6 kg 91.5 kg    Scheduled Meds:  (feeding supplement) PROSource Plus  30 mL Oral BID BM   amLODipine  10 mg Oral Daily   amoxicillin-clavulanate  1 tablet Oral QHS   calcitRIOL  0.25 mcg Oral Daily   Chlorhexidine Gluconate Cloth  6 each Topical Daily   darbepoetin (ARANESP) injection - NON-DIALYSIS  100 mcg Subcutaneous Q Mon-1800   feeding supplement  237 mL Oral BID BM   gabapentin  100 mg Oral TID   heparin injection (subcutaneous)  5,000 Units Subcutaneous Q8H   insulin aspart  0-5 Units Subcutaneous QHS   nystatin   Topical TID   pantoprazole  40 mg Oral QHS   sertraline  100 mg Oral QHS   sodium bicarbonate  1,300 mg Oral BID   Continuous Infusions:  fluconazole (DIFLUCAN) IV 100 mg (06/27/22 1218)   PRN Meds:.HYDROcodone-acetaminophen, HYDROmorphone (DILAUDID) injection, ondansetron (ZOFRAN) IV, phenol  Current Labs: reviewed    Physical Exam:  Blood pressure (!) 161/92, pulse 92, temperature 98.2 F (36.8 C), temperature source Oral, resp. rate 17, height _0  (1.651 m), weight 91.5 kg, last menstrual period 06/15/2022, SpO2 97 %. NAD, in bed RRR, no rub CTAB, diminished in bases NCAT EOMI No rashes/lesions  A AoCKD4 in context of #2 BL SCr seems to be around 4; sees Bhandari CKA No HD indications SCr and BUn improved in past 24h R thigh necrotizing fasciits req I&D remains on Augment per CCS / TRH ANemia: Hb stable, on darbe HTN/Vol: just resumed amlodipine, trend for another 24h Met Acidosis on NaHCO3 CKD-BMD: C3, Trend P 5.6 is  ok  P As above Cont supportive care Trend labs and UOP Medication Issues; Preferred narcotic agents for pain control are hydromorphone, fentanyl, and methadone. Morphine should not be used.  Baclofen should be avoided Avoid oral sodium phosphate and magnesium citrate based laxatives / bowel preps    Pearson Grippe MD 06/27/2022, 1:07 PM  Recent Labs  Lab 06/25/22 0223 06/26/22 1150 06/27/22 0307  NA 143 144 144  K 4.1 4.0 3.9  CL 114* 115* 113*  CO2 18* 18* 20*  GLUCOSE 85 147* 168*  BUN 88* 81* 75*  CREATININE 6.50* 5.77* 5.50*  CALCIUM 7.5* 7.7* 7.6*  PHOS 5.9* 5.8* 5.6*   Recent Labs  Lab 06/25/22 0223 06/26/22 1150 06/27/22 0307  WBC 22.5* 24.5* 22.0*  NEUTROABS  --   --  17.6*  HGB 11.4* 10.8* 9.9*  HCT 33.1* 32.8* 29.6*  MCV 89.0 91.1 90.0  PLT 125* 110* 115*

## 2022-06-27 NOTE — Plan of Care (Signed)

## 2022-06-27 NOTE — Progress Notes (Addendum)
PROGRESS NOTE        PATIENT DETAILS Name: Jacqueline [name] Last Age: 41 y.o. Sex: female Date of Birth: Jan 31, 1981 Admit Date: 06/18/2022 Admitting Physician Arta Bruce Kinsinger, MD [id number], Larey Dresser, MD  Brief Summary: Patient is a 41 y.o.  female with history of CKD stage IV, DM-2, HTN, s/p gastric bypass 2020-presenting with severe sepsis due to necrotizing fasciitis of right thigh/groin-evaluated by orthopedics/general surgery-and underwent right thigh I&D/debridement-and was subsequently admitted by PCCM postoperatively.  She was stabilized and subsequently transferred to [facility] on 12/5.  Significant events: 12/2>> presenting with right thigh necrotizing fasciitis-s/p debridement by Ortho/general surgery-admit to ICU postoperatively. 12/4>> irrigation/debridement by general surgery 12/5>> transferred to [facility].  Significant studies: 12/2>> CT abdomen/pelvis: Findings concerning for necrotizing fasciitis in the right thigh.  Probable right psoas muscle abscess. 12/2>> CT of the right lower extremity: Necrotizing fasciitis/infectious myositis of right thigh.  Significant microbiology data: 12/2>> COVID/influenza PCR: Negative 12/2>> right thigh tissue: Streptococcus anginosus/mixed anaerobic flora. 12/2>> blood culture: No growth  Procedures: 12/2>> right thigh I&D by Ortho, advised pelvis I&D by general surgery 12/4>> right thigh/groin irrigation and debridement  Consults: Orthopedics General surgery Infectious disease Nephrology PCCM  Subjective: Continues to have significant amount of pain in the operative site of site.  Otherwise no major issues.   I was told by the surgical PA that patient was complaining of oral thrush and some difficulty swallowing.  Objective: Vitals: Blood pressure (!) 161/92, pulse 92, temperature 98.2 F (36.8 C), temperature source Oral, resp. rate 17, height 5\' 5"  (1.651 m), weight 91.5 kg, last menstrual period  06/15/2022, SpO2 97 %.   Exam: Gen Exam:Alert awake-not in any distress HEENT:atraumatic, normocephalic Chest: B/L clear to auscultation anteriorly CVS:S1S2 regular Abdomen:soft non tender, non distended Extremities:no edema Neurology: Non focal Skin: no rash  Pertinent Labs/Radiology:    Latest Ref Rng & Units 06/27/2022    3:07 AM 06/26/2022   11:50 AM 06/25/2022    2:23 AM  CBC  WBC 4.0 - 10.5 K/uL 22.0  24.5  22.5   Hemoglobin 12.0 - 15.0 g/dL 9.9  10.8  11.4   Hematocrit 36.0 - 46.0 % 29.6  32.8  33.1   Platelets 150 - 400 K/uL 115  110  125     Lab Results  Component Value Date   NA 144 06/27/2022   K 3.9 06/27/2022   CL 113 (H) 06/27/2022   CO2 20 (L) 06/27/2022      Assessment/Plan: Severe sepsis due to necrotizing fasciitis involving right thigh/pelvis Sepsis physiology has resolved-s/p I&D on 12/2 and 12/4 Leukocytosis slowly downtrending Per general surgery-surgical site looks clean without any evidence of infection  Evaluated by ID-has been transitioned to oral Augmentin. Continues to have significant pain-on as needed narcotics.  Discussed with surgical team-they will adjust oral narcotic regimen.  AKI on CKD 4 AKI-likely due to ischemic ATN in the setting of sepsis physiology Nausea/vomiting better-thought to be from narcotics/post surgical issues rather than uremia.   Renal function gradually improving Nephrology following.  Hyperkalemia Resolved  Metabolic acidosis Due to CKD On oral bicarb supplementation  Oral candidiasis Start IV Diflucan Follow.  Normocytic anemia Due to a combination of anemia due to CKD and acute illness. Darbepoetin/iron defer to nephrology service Follow CBC  HTN BP still elevated Increase amlodipine-will get first dose of 10 mg today Follow/optimize  DM-2 (A1c 5.4 on 12/5) with hyperglycemia Not on any medications at home Monitor closely on extra sensitive SSI  Recent Labs    06/26/22 2120  06/27/22 0811 06/27/22 1119  GLUCAP 160* 159* 139*     Hyponatremia Mild Follow-up for now  Depression Stable Zoloft  BMI: Estimated body mass index is 33.57 kg/m as calculated from the following:   Height as of this encounter: 5\' 5"  (1.651 m).   Weight as of this encounter: 91.5 kg.   Code status:   Code Status: Prior   DVT Prophylaxis: heparin injection 5,000 Units Start: 06/18/22 1715   Family Communication: Spouse at bedside.   Disposition Plan: Status is: Inpatient Remains inpatient appropriate because: Severity of illness-needs better pain control on oral narcotics before d/c can be considered   Planned Discharge Destination: Home health versus SNF   Diet: Diet Order             Diet regular Room service appropriate? Yes; Fluid consistency: Thin  Diet effective now                     Antimicrobial agents: Anti-infectives (From admission, onward)    Start     Dose/Rate Route Frequency Ordered Stop   06/27/22 1230  fluconazole (DIFLUCAN) IVPB 100 mg        100 mg 50 mL/hr over 60 Minutes Intravenous Every 24 hours 06/27/22 1130 07/04/22 1229   06/22/22 2200  amoxicillin-clavulanate (AUGMENTIN) 500-125 MG per tablet 1 tablet        1 tablet Oral Daily at bedtime 06/22/22 1356 06/29/22 2159   06/22/22 0600  ceFAZolin (ANCEF) IVPB 2g/100 mL premix  Status:  Discontinued        2 g 200 mL/hr over 30 Minutes Intravenous On call to O.R. 06/21/22 1829 06/21/22 1830   06/21/22 1700  Ampicillin-Sulbactam (UNASYN) 3 g in sodium chloride 0.9 % 100 mL IVPB  Status:  Discontinued        3 g 200 mL/hr over 30 Minutes Intravenous Every 12 hours 06/21/22 1435 06/22/22 1356   06/18/22 1900  linezolid (ZYVOX) IVPB 600 mg  Status:  Discontinued        600 mg 300 mL/hr over 60 Minutes Intravenous Every 12 hours 06/18/22 1702 06/22/22 1134   06/18/22 1715  meropenem (MERREM) 1 g in sodium chloride 0.9 % 100 mL IVPB  Status:  Discontinued        1 g 200 mL/hr over  30 Minutes Intravenous Every 12 hours 06/18/22 1709 06/21/22 1411   06/18/22 0745  piperacillin-tazobactam (ZOSYN) IVPB 3.375 g        3.375 g 100 mL/hr over 30 Minutes Intravenous  Once 06/18/22 0733 06/18/22 0835        MEDICATIONS: Scheduled Meds:  (feeding supplement) PROSource Plus  30 mL Oral BID BM   amLODipine  10 mg Oral Daily   amoxicillin-clavulanate  1 tablet Oral QHS   calcitRIOL  0.25 mcg Oral Daily   Chlorhexidine Gluconate Cloth  6 each Topical Daily   darbepoetin (ARANESP) injection - NON-DIALYSIS  100 mcg Subcutaneous Q Mon-1800   feeding supplement  237 mL Oral BID BM   gabapentin  100 mg Oral TID   heparin injection (subcutaneous)  5,000 Units Subcutaneous Q8H   insulin aspart  0-5 Units Subcutaneous QHS   nystatin   Topical TID   pantoprazole  40 mg Oral QHS   sertraline  100 mg Oral QHS   sodium bicarbonate  1,300 mg Oral BID   Continuous Infusions:  fluconazole (DIFLUCAN) IV      PRN Meds:.HYDROcodone-acetaminophen, HYDROmorphone (DILAUDID) injection, ondansetron (ZOFRAN) IV, phenol   I have personally reviewed following labs and imaging studies  LABORATORY DATA: CBC: Recent Labs  Lab 06/23/22 0811 06/24/22 0346 06/25/22 0223 06/26/22 1150 06/27/22 0307  WBC 25.0* 23.6* 22.5* 24.5* 22.0*  NEUTROABS  --   --   --   --  17.6*  HGB 10.5* 10.3* 11.4* 10.8* 9.9*  HCT 31.9* 30.4* 33.1* 32.8* 29.6*  MCV 89.6 89.9 89.0 91.1 90.0  PLT 167 128* 125* 110* 115*     Basic Metabolic Panel: Recent Labs  Lab 06/21/22 0531 06/21/22 0532 06/23/22 0811 06/24/22 0346 06/25/22 0223 06/26/22 1150 06/27/22 0307  NA  --    < > 139 138 143 144 144  K  --    < > 4.0 3.9 4.1 4.0 3.9  CL  --    < > 111 109 114* 115* 113*  CO2  --    < > 17* 18* 18* 18* 20*  GLUCOSE  --    < > 69* 102* 85 147* 168*  BUN  --    < > 86* 90* 88* 81* 75*  CREATININE  --    < > 6.52* 6.92* 6.50* 5.77* 5.50*  CALCIUM  --    < > 7.6* 7.6* 7.5* 7.7* 7.6*  MG 2.0  --   --   --    --   --   --   PHOS 7.1*   < > 6.1* 7.0* 5.9* 5.8* 5.6*   < > = values in this interval not displayed.     GFR: Estimated Creatinine Clearance: 14.9 mL/min (A) (by C-G formula based on SCr of 5.5 mg/dL (H)).  Liver Function Tests: Recent Labs  Lab 06/23/22 0811 06/24/22 0346 06/25/22 0223 06/26/22 1150 06/27/22 0307  ALBUMIN 1.5* 1.5* 1.7* 1.9* 1.8*    No results for input(s): "LIPASE", "AMYLASE" in the last 168 hours. No results for input(s): "AMMONIA" in the last 168 hours.  Coagulation Profile: No results for input(s): "INR", "PROTIME" in the last 168 hours.  Cardiac Enzymes: No results for input(s): "CKTOTAL", "CKMB", "CKMBINDEX", "TROPONINI" in the last 168 hours.  BNP (last 3 results) No results for input(s): "PROBNP" in the last 8760 hours.  Lipid Profile: No results for input(s): "CHOL", "HDL", "LDLCALC", "TRIG", "CHOLHDL", "LDLDIRECT" in the last 72 hours.  Thyroid Function Tests: No results for input(s): "TSH", "T4TOTAL", "FREET4", "T3FREE", "THYROIDAB" in the last 72 hours.  Anemia Panel: No results for input(s): "VITAMINB12", "FOLATE", "FERRITIN", "TIBC", "IRON", "RETICCTPCT" in the last 72 hours.  Urine analysis:    Component Value Date/Time   COLORURINE YELLOW 06/18/2022 0820   APPEARANCEUR HAZY (A) 06/18/2022 0820   LABSPEC 1.013 06/18/2022 0820   PHURINE 7.0 06/18/2022 0820   GLUCOSEU 50 (A) 06/18/2022 0820   HGBUR NEGATIVE 06/18/2022 0820   BILIRUBINUR NEGATIVE 06/18/2022 0820   KETONESUR NEGATIVE 06/18/2022 0820   PROTEINUR >=300 (A) 06/18/2022 0820   UROBILINOGEN 0.2 08/21/2012 2130   NITRITE NEGATIVE 06/18/2022 0820   LEUKOCYTESUR TRACE (A) 06/18/2022 0820    Sepsis Labs: Lactic Acid, Venous    Component Value Date/Time   LATICACIDVEN 2.4 (HH) 06/18/2022 1755    MICROBIOLOGY: Recent Results (from the past 240 hour(s))  Resp Panel by RT-PCR (Flu A&B, Covid) Anterior Nasal Swab     Status: None   Collection Time: 06/18/22  3:31 AM  Specimen: Anterior Nasal Swab  Result Value Ref Range Status   SARS Coronavirus 2 by RT PCR NEGATIVE NEGATIVE Final    Comment: (NOTE) SARS-CoV-2 target nucleic acids are NOT DETECTED.  The SARS-CoV-2 RNA is generally detectable in upper respiratory specimens during the acute phase of infection. The lowest concentration of SARS-CoV-2 viral copies this assay can detect is 138 copies/mL. A negative result does not preclude SARS-Cov-2 infection and should not be used as the sole basis for treatment or other patient management decisions. A negative result may occur with  improper specimen collection/handling, submission of specimen other than nasopharyngeal swab, presence of viral mutation(s) within the areas targeted by this assay, and inadequate number of viral copies(<138 copies/mL). A negative result must be combined with clinical observations, patient history, and epidemiological information. The expected result is Negative.  Fact Sheet for Patients:  EntrepreneurPulse.com.au  Fact Sheet for Healthcare Providers:  IncredibleEmployment.be  This test is no t yet approved or cleared by the [place] FDA and  has been authorized for detection and/or diagnosis of SARS-CoV-2 by FDA under an Emergency Use Authorization (EUA). This EUA will remain  in effect (meaning this test can be used) for the duration of the COVID-19 declaration under Section 564(b)(1) of the Act, 21 U.S.C.section 360bbb-3(b)(1), unless the authorization is terminated  or revoked sooner.       Influenza A by PCR NEGATIVE NEGATIVE Final   Influenza B by PCR NEGATIVE NEGATIVE Final    Comment: (NOTE) The Xpert Xpress SARS-CoV-2/FLU/RSV plus assay is intended as an aid in the diagnosis of influenza from Nasopharyngeal swab specimens and should not be used as a sole basis for treatment. Nasal washings and aspirates are unacceptable for Xpert Xpress  SARS-CoV-2/FLU/RSV testing.  Fact Sheet for Patients: EntrepreneurPulse.com.au  Fact Sheet for Healthcare Providers: IncredibleEmployment.be  This test is not yet approved or cleared by the [place] FDA and has been authorized for detection and/or diagnosis of SARS-CoV-2 by FDA under an Emergency Use Authorization (EUA). This EUA will remain in effect (meaning this test can be used) for the duration of the COVID-19 declaration under Section 564(b)(1) of the Act, 21 U.S.C. section 360bbb-3(b)(1), unless the authorization is terminated or revoked.  Performed at [facility], [place] [address], [place], [place] [address]   Culture, blood (routine x 2)     Status: None   Collection Time: 06/18/22  7:30 AM   Specimen: BLOOD  Result Value Ref Range Status   Specimen Description BLOOD SITE NOT SPECIFIED  Final   Special Requests   Final    BOTTLES DRAWN AEROBIC AND ANAEROBIC Blood Culture results may not be optimal due to an inadequate volume of blood received in culture bottles   Culture   Final    NO GROWTH 5 DAYS Performed at [facility], [place] [address], [place], [place] [address]    Report Status 06/23/2022 FINAL  Final  Culture, blood (routine x 2)     Status: None   Collection Time: 06/18/22  7:51 AM   Specimen: BLOOD  Result Value Ref Range Status   Specimen Description BLOOD SITE NOT SPECIFIED  Final   Special Requests   Final    BOTTLES DRAWN AEROBIC AND ANAEROBIC Blood Culture adequate volume   Culture   Final    NO GROWTH 5 DAYS Performed at [facility], [place] [address] [place], [place] [address]    Report Status 06/23/2022 FINAL  Final  Aerobic/Anaerobic Culture  w Gram Stain (surgical/deep wound)     Status: None   Collection Time: 06/18/22  3:04 PM   Specimen: PATH Other; Body Fluid  Result Value Ref Range Status   Specimen Description FLUID RIGHT THIGH  Final   Special Requests PT ON ANCEF  Final   Gram  Stain   Final    RARE WBC PRESENT, PREDOMINANTLY PMN FEW GRAM POSITIVE COCCI FEW GRAM NEGATIVE RODS Performed at [facility], [place] [address] [place] [address], [place], [place] [address]    Culture   Final    MODERATE STREPTOCOCCUS ANGINOSIS MIXED ANAEROBIC FLORA PRESENT.  CALL LAB IF FURTHER IID REQUIRED.    Report Status 06/21/2022 FINAL  Final   Organism ID, Bacteria STREPTOCOCCUS ANGINOSIS  Final      Susceptibility   Streptococcus anginosis - MIC*    PENICILLIN <=0.06 SENSITIVE Sensitive     CEFTRIAXONE <=0.12 SENSITIVE Sensitive     ERYTHROMYCIN 4 RESISTANT Resistant     LEVOFLOXACIN 0.5 SENSITIVE Sensitive     VANCOMYCIN 0.5 SENSITIVE Sensitive     * MODERATE STREPTOCOCCUS ANGINOSIS  MRSA Next Gen by PCR, Nasal     Status: None   Collection Time: 06/18/22  5:55 PM   Specimen: Nasal Mucosa; Nasal Swab  Result Value Ref Range Status   MRSA by PCR Next Gen NOT DETECTED NOT DETECTED Final    Comment: (NOTE) The GeneXpert MRSA Assay (FDA approved for NASAL specimens only), is one component of a comprehensive MRSA colonization surveillance program. It is not intended to diagnose MRSA infection nor to guide or monitor treatment for MRSA infections. Test performance is not FDA approved in patients less than 38 years old. Performed at [facility], [place] [address], [place], [place] [address]     RADIOLOGY STUDIES/RESULTS: No results found.   LOS: 9 days   Oren Binet, MD  [facility]    To contact the attending provider between 7A-7P or the covering provider during after hours 7P-7A, please log into the web site [email or web address] and access using universal [place] password for that web site. If you do not have the password, please call the hospital operator.  06/27/2022, 11:30 AM

## 2022-06-27 NOTE — Progress Notes (Signed)
Inpatient Rehab Admissions Coordinator:   Per PT recommendations  patient was screened for CIR candidacy by Clemens Catholic, MS, CCC-SLP . At this time, Pt. Appears to be a a potential candidate for CIR. I will place   order for rehab consult per protocol for full assessment. Please contact me any with questions.  Clemens Catholic, [place], [place] Admissions Coordinator  [phone] ([place]) [phone] (office)

## 2022-06-27 NOTE — Progress Notes (Signed)
Physical Therapy Treatment Patient Details Name: Jacqueline [name] MRN: [id number] DOB: 27-Aug-1980 Today's Date: 06/27/2022   History of Present Illness Patient is 41 y.o. female admitted 12/02  for Rt psoas abscess and pelvic necrotizing fasciitis extendign to Rt thigh. Now s/p excisional debridement of skin, muscle (iliacus, psoas, transversus abdominis), fascia of pelvis, abdominal wall and retroperitoneum dissection on 12/2. PMHx: anemia, axiety, HTN, and DMII.    PT Comments    Further training to improve strength, stability, and safety with gait today. Required min assist with bed mobility, transfers, and gait. Focused on symmetry of steps, awareness, and sequencing with larger step length. Still shows unpredictable instability of RLE but able to better identify and use RW appropriately to support and unload RLE as needed with gait. Pt concerned about returning home with ongoing RLE weakness and fall with staff recently. Would consider AIR consult to determine if she meets criteria for admission for a short stay in order to further improve her independence prior to returning home. We will continue to work aggressively with Jacqueline [name] but may ultimately need post-acute rehab before a safe transition back home. Patient will continue to benefit from skilled physical therapy services to further improve independence with functional mobility.   Recommendations for follow up therapy are one component of a multi-disciplinary discharge planning process, led by the attending physician.  Recommendations may be updated based on patient status, additional functional criteria and insurance authorization.  Follow Up Recommendations  Acute inpatient rehab (3hours/day)     Assistance Recommended at Discharge Frequent or constant Supervision/Assistance  Patient can return home with the following A lot of help with walking and/or transfers;A lot of help with bathing/dressing/bathroom;Assistance with  cooking/housework;Direct supervision/assist for medications management;Assist for transportation;Help with stairs or ramp for entrance   Equipment Recommendations  Rolling walker (2 wheels);Wheelchair (measurements PT);Wheelchair cushion (measurements PT)    Recommendations for Other Services Rehab consult     Precautions / Restrictions Precautions Precautions: Fall Precaution Comments: R knee buckle, watch BP Restrictions Weight Bearing Restrictions: No RLE Weight Bearing: Weight bearing as tolerated     Mobility  Bed Mobility Overal bed mobility: Needs Assistance Bed Mobility: Sit to Supine, Supine to Sit     Supine to sit: HOB elevated, Min assist Sit to supine: Min assist   General bed mobility comments: Min assist for RLE out of bed, getting stuck from friction on sheets. Min assist for RLE back into bed however educated on using LLE to hook behind heel and lift which was very helpful for pt.    Transfers Overall transfer level: Needs assistance Equipment used: Rolling walker (2 wheels) Transfers: Sit to/from Stand Sit to Stand: Min assist   Step pivot transfers: Min assist       General transfer comment: Min assist for boost, practiced x2 with cues for hand placement and technique. Very slow to rise.    Ambulation/Gait Ambulation/Gait assistance: Min assist Gait Distance (Feet): 20 Feet Assistive device: Rolling walker (2 wheels) Gait Pattern/deviations: Step-to pattern, Decreased stance time - right, Decreased stride length, Decreased weight shift to right, Knees buckling, Shuffle, Trunk flexed Gait velocity: slow Gait velocity interpretation: <1.31 ft/sec, indicative of household ambulator   General Gait Details: Initially shuffling feet, adjusted RW, cued for sequencing and UE support which greatly improved step quality. Performing step-to pattern leading with LLE. Attempted lead with RLE however showing instability in Rt knee, pt fearful. Easily fatigued  towards end of distance, HR 119.   Stairs  Wheelchair Mobility    Modified Rankin (Stroke Patients Only)       Balance Overall balance assessment: Needs assistance Sitting-balance support: Feet supported, No upper extremity supported Sitting balance-Leahy Scale: Good     Standing balance support: Bilateral upper extremity supported, During functional activity Standing balance-Leahy Scale: Poor Standing balance comment: relies on BUE support                            Cognition Arousal/Alertness: Awake/alert Behavior During Therapy: WFL for tasks assessed/performed Overall Cognitive Status: Within Functional Limits for tasks assessed                                          Exercises General Exercises - Lower Extremity Ankle Circles/Pumps: AROM, Both, 10 reps, Seated Long Arc Quad: AAROM, Right, 10 reps, Seated Hip ABduction/ADduction: Strengthening, Both, 10 reps, Seated    General Comments        Pertinent Vitals/Pain Pain Assessment Pain Assessment: Faces Faces Pain Scale: Hurts little more Pain Location: Rt thigh Pain Descriptors / Indicators: Discomfort Pain Intervention(s): Monitored during session, Repositioned    Home Living                          Prior Function            PT Goals (current goals can now be found in the care plan section) Acute Rehab PT Goals Patient Stated Goal: Get well PT Goal Formulation: With patient Time For Goal Achievement: 07/06/22 Potential to Achieve Goals: Good Progress towards PT goals: Progressing toward goals    Frequency    Min 4X/week      PT Plan Discharge plan needs to be updated    Co-evaluation              AM-PAC PT "6 Clicks" Mobility   Outcome Measure  Help needed turning from your back to your side while in a flat bed without using bedrails?: A Little Help needed moving from lying on your back to sitting on the side of a flat  bed without using bedrails?: A Little Help needed moving to and from a bed to a chair (including a wheelchair)?: A Lot Help needed standing up from a chair using your arms (e.g., wheelchair or bedside chair)?: A Lot Help needed to walk in hospital room?: A Lot Help needed climbing 3-5 steps with a railing? : A Lot 6 Click Score: 14    End of Session Equipment Utilized During Treatment: Gait belt Activity Tolerance: Patient limited by fatigue Patient left: in bed;with call bell/phone within reach;with bed alarm set;with family/visitor present   PT Visit Diagnosis: Other abnormalities of gait and mobility (R26.89);Muscle weakness (generalized) (M62.81);Difficulty in walking, not elsewhere classified (R26.2)     Time: 1400-1420 PT Time Calculation (min) (ACUTE ONLY): 20 min  Charges:  $Gait Training: 8-22 mins                     Candie Mile, PT, DPT Physical Therapist Acute Rehabilitation Services [place]    Jacqueline [name] 06/27/2022, 3:11 PM

## 2022-06-27 NOTE — Consult Note (Signed)
Assisted [facility] with orders for [employer] [employer]  [place], [place], [employer] [phone]

## 2022-06-27 NOTE — Progress Notes (Addendum)
Occupational Therapy Treatment Patient Details Name: Jacqueline [name] MRN: [id number] DOB: 1981-01-22 Today's Date: 06/27/2022   History of present illness Patient is 41 y.o. female admitted for Rt psoas abscess and pelvic necrotizing fasciitis extendign to Rt thigh. Now s/p excisional debridement of skin, muscle (iliacus, psoas, transversus abdominis), fascia of pelvis, abdominal wall and retroperitoneum dissection on 12/2.   OT comments  Patient progressing slowly towards goals.  Requires encouragement due to pain, but engaged in bed mobility with supervision and transfers using RW with min assist. Requires mod assist for LB dressing due to R LE pain and decreased functional use.  Pt tearful during session due to frustration. Has good support at home, but will benefit from continued OT services at dc.  Will follow acutely.    Recommendations for follow up therapy are one component of a multi-disciplinary discharge planning process, led by the attending physician.  Recommendations may be updated based on patient status, additional functional criteria and insurance authorization.    Follow Up Recommendations  Home health OT     Assistance Recommended at Discharge Intermittent Supervision/Assistance  Patient can return home with the following  Assist for transportation;Assistance with cooking/housework;A lot of help with walking and/or transfers;A little help with bathing/dressing/bathroom   Equipment Recommendations  BSC/3in1    Recommendations for Other Services      Precautions / Restrictions Precautions Precautions: Fall Precaution Comments: R knee buckle, watch BP Restrictions Weight Bearing Restrictions: No RLE Weight Bearing: Weight bearing as tolerated       Mobility Bed Mobility Overal bed mobility: Needs Assistance Bed Mobility: Supine to Sit     Supine to sit: HOB elevated, Supervision     General bed mobility comments: assist for LE lift into bed, pt able to scoot  self up in bed    Transfers Overall transfer level: Needs assistance Equipment used: Rolling walker (2 wheels) Transfers: Sit to/from Stand, Bed to chair/wheelchair/BSC Sit to Stand: Min assist     Step pivot transfers: Min assist     General transfer comment: min assist to power up given increased time and cueing for hand placement x 3, able to step to recliner towards R with min assist.     Balance Overall balance assessment: Needs assistance Sitting-balance support: Feet supported, Bilateral upper extremity supported Sitting balance-Leahy Scale: Good     Standing balance support: Bilateral upper extremity supported, During functional activity Standing balance-Leahy Scale: Fair Standing balance comment: relies on BUE support                           ADL either performed or assessed with clinical judgement   ADL Overall ADL's : Needs assistance/impaired     Grooming: Set up;Sitting               Lower Body Dressing: Sit to/from stand;Moderate assistance Lower Body Dressing Details (indicate cue type and reason): requires assist with R sock, relies on BUE support in standing Toilet Transfer: Minimal assistance;Ambulation Toilet Transfer Details (indicate cue type and reason): simulated to recliner, using RW         Functional mobility during ADLs: Minimal assistance;Rolling walker (2 wheels)      Extremity/Trunk Assessment Upper Extremity Assessment Upper Extremity Assessment: Overall WFL for tasks assessed            Vision       Perception     Praxis      Cognition Arousal/Alertness: Awake/alert Behavior During Therapy:  WFL for tasks assessed/performed Overall Cognitive Status: Within Functional Limits for tasks assessed                                          Exercises      Shoulder Instructions       General Comments VSS, R LE wound and limited by pain    Pertinent Vitals/ Pain       Pain  Assessment Pain Assessment: Faces Faces Pain Scale: Hurts little more Pain Location: Rt thigh Pain Descriptors / Indicators: Discomfort Pain Intervention(s): Limited activity within patient's tolerance, Monitored during session, Repositioned  Home Living                                          Prior Functioning/Environment              Frequency  Min 2X/week        Progress Toward Goals  OT Goals(current goals can now be found in the care plan section)  Progress towards OT goals: Progressing toward goals  Acute Rehab OT Goals Patient Stated Goal: return home/less pain OT Goal Formulation: With patient Time For Goal Achievement: 07/07/22 Potential to Achieve Goals: Good  Plan Frequency remains appropriate;Discharge plan needs to be updated    Co-evaluation                 AM-PAC OT "6 Clicks" Daily Activity     Outcome Measure   Help from another person eating meals?: None Help from another person taking care of personal grooming?: A Little Help from another person toileting, which includes using toliet, bedpan, or urinal?: A Lot Help from another person bathing (including washing, rinsing, drying)?: A Lot Help from another person to put on and taking off regular upper body clothing?: A Little Help from another person to put on and taking off regular lower body clothing?: A Lot 6 Click Score: 16    End of Session Equipment Utilized During Treatment: Gait belt;Rolling walker (2 wheels)  OT Visit Diagnosis: Unsteadiness on feet (R26.81);Muscle weakness (generalized) (M62.81)   Activity Tolerance Patient tolerated treatment well   Patient Left in chair;with call bell/phone within reach;with chair alarm set;with family/visitor present   Nurse Communication Mobility status        Time: 1040-1105 OT Time Calculation (min): 25 min  Charges: OT General Charges $OT Visit: 1 Visit OT Treatments $Self Care/Home Management : 23-37  mins  [place] [phone]   Delight Stare 06/27/2022, 1:14 PM

## 2022-06-28 LAB — RENAL FUNCTION PANEL
Albumin: 1.7 g/dL — ABNORMAL LOW (ref 3.5–5.0)
Anion gap: 9 (ref 5–15)
BUN: 70 mg/dL — ABNORMAL HIGH (ref 6–20)
CO2: 20 mmol/L — ABNORMAL LOW (ref 22–32)
Calcium: 7.7 mg/dL — ABNORMAL LOW (ref 8.9–10.3)
Chloride: 117 mmol/L — ABNORMAL HIGH (ref 98–111)
Creatinine, Ser: 5.22 mg/dL — ABNORMAL HIGH (ref 0.44–1.00)
GFR, Estimated: 10 mL/min — ABNORMAL LOW (ref >60)
Glucose, Bld: 118 mg/dL — ABNORMAL HIGH (ref 70–99)
Phosphorus: 5.3 mg/dL — ABNORMAL HIGH (ref 2.5–4.6)
Potassium: 4 mmol/L (ref 3.5–5.1)
Sodium: 146 mmol/L — ABNORMAL HIGH (ref 135–145)

## 2022-06-28 LAB — GLUCOSE, CAPILLARY
Glucose-Capillary: 167 mg/dL — ABNORMAL HIGH (ref 70–99)
Glucose-Capillary: 163 mg/dL — ABNORMAL HIGH (ref 70–99)
Glucose-Capillary: 141 mg/dL — ABNORMAL HIGH (ref 70–99)
Glucose-Capillary: 150 mg/dL — ABNORMAL HIGH (ref 70–99)

## 2022-06-28 LAB — CBC
HCT: 29.9 % — ABNORMAL LOW (ref 36.0–46.0)
Hemoglobin: 10 g/dL — ABNORMAL LOW (ref 12.0–15.0)
MCH: 29.8 pg (ref 26.0–34.0)
MCHC: 33.4 g/dL (ref 30.0–36.0)
MCV: 89 fL (ref 80.0–100.0)
Platelets: 146 10*3/uL — ABNORMAL LOW (ref 150–400)
RBC: 3.36 MIL/uL — ABNORMAL LOW (ref 3.87–5.11)
RDW: 14.2 % (ref 11.5–15.5)
WBC: 20.6 10*3/uL — ABNORMAL HIGH (ref 4.0–10.5)
nRBC: 0.4 % — ABNORMAL HIGH (ref 0.0–0.2)

## 2022-06-28 NOTE — Progress Notes (Signed)
Admit: 06/18/2022 LOS: 58  33F with AKI on CKD4 in setting of RLE necrotizing fasciitis  Subjective:  SCr down to 5.2, K 4.0, HCO3 20.  UOP not well recorded On RA BPs midly elevated on CCB Meds include: CCB, gabapentin, NaHCo3, ESA No c/o; not eating much, trying to drink as able  12/11 0701 - 12/12 0700 In: 39.5 [IV Piggyback:39.5] Out: -   Filed Weights   06/18/22 1343 06/18/22 1730  Weight: 82.6 kg 91.5 kg    Scheduled Meds:  (feeding supplement) PROSource Plus  30 mL Oral BID BM   acetaminophen  325 mg Oral Q6H   amLODipine  10 mg Oral Daily   amoxicillin-clavulanate  1 tablet Oral QHS   calcitRIOL  0.25 mcg Oral Daily   Chlorhexidine Gluconate Cloth  6 each Topical Daily   darbepoetin (ARANESP) injection - NON-DIALYSIS  100 mcg Subcutaneous Q Mon-1800   feeding supplement  237 mL Oral BID BM   gabapentin  100 mg Oral TID   heparin injection (subcutaneous)  5,000 Units Subcutaneous Q8H   insulin aspart  0-5 Units Subcutaneous QHS   nystatin   Topical TID   pantoprazole  40 mg Oral QHS   sertraline  100 mg Oral QHS   sodium bicarbonate  1,300 mg Oral BID   Continuous Infusions:  fluconazole (DIFLUCAN) IV Stopped (06/27/22 1311)   PRN Meds:.HYDROcodone-acetaminophen, HYDROmorphone (DILAUDID) injection, ondansetron (ZOFRAN) IV, phenol  Current Labs: reviewed    Physical Exam:  Blood pressure (!) 158/82, pulse 93, temperature 97.6 F (36.4 C), temperature source Oral, resp. rate (!) 24, height _0  (1.651 m), weight 91.5 kg, last menstrual period 06/15/2022, SpO2 98 %. NAD, in chair RRR, no rub CTAB, diminished in bases NCAT EOMI No rashes/lesions  A AoCKD4 in context of #2 BL SCr seems to be around 4; sees Bhandari CKA No HD indications SCr and BUn improved in past 24h R thigh necrotizing fasciits req I&D remains on Augmentn per CCS / TRH ANemia: Hb stable, on darbe HTN/Vol: just resumed amlodipine, trend for another 24h Met Acidosis on  NaHCO3 CKD-BMD: C3, Trend P 5.3 is ok  P Stable from nephrology viewpoint.  Will need close f/u at discharge and I will arrange appt at [place].  No further recommendations. Will sign off but please call with questions or concerns.  Cont supportive care Trend labs and UOP Medication Issues; Preferred narcotic agents for pain control are hydromorphone, fentanyl, and methadone. Morphine should not be used.  Baclofen should be avoided Avoid oral sodium phosphate and magnesium citrate based laxatives / bowel preps    Pearson Grippe MD 06/28/2022, 12:27 PM  Recent Labs  Lab 06/26/22 1150 06/27/22 0307 06/28/22 0324  NA 144 144 146*  K 4.0 3.9 4.0  CL 115* 113* 117*  CO2 18* 20* 20*  GLUCOSE 147* 168* 118*  BUN 81* 75* 70*  CREATININE 5.77* 5.50* 5.22*  CALCIUM 7.7* 7.6* 7.7*  PHOS 5.8* 5.6* 5.3*    Recent Labs  Lab 06/26/22 1150 06/27/22 0307 06/28/22 0324  WBC 24.5* 22.0* 20.6*  NEUTROABS  --  17.6*  --   HGB 10.8* 9.9* 10.0*  HCT 32.8* 29.6* 29.9*  MCV 91.1 90.0 89.0  PLT 110* 115* 146*

## 2022-06-28 NOTE — Progress Notes (Signed)
8 Days Post-Op  Subjective: Doing well today.  Feels like pain is well controlled with vicodin and gabapentin.  Wants to go to CIR.  Objective: Vital signs in last 24 hours: Temp:  [97.6 F (36.4 C)-98.3 F (36.8 C)] 97.6 F (36.4 C) (12/12 0000) Pulse Rate:  [84-96] 93 (12/12 0815) Resp:  [16-24] 24 (12/12 0815) BP: (141-167)/(80-92) 141/83 (12/12 0815) SpO2:  [96 %-100 %] 98 % (12/12 0815) Last BM Date : 06/26/22  Intake/Output from previous day: 12/11 0701 - 12/12 0700 In: 39.5 [IV Piggyback:39.5] Out: -  Intake/Output this shift: No intake/output data recorded.  PE: Gen:  Alert, NAD, pleasant skin: groin and thigh wounds are clean with no further necrotic tissue really at this point.  Groin covering a bit wet. Psych: A&Ox3   Lab Results:  Recent Labs    06/27/22 0307 06/28/22 0324  WBC 22.0* 20.6*  HGB 9.9* 10.0*  HCT 29.6* 29.9*  PLT 115* 146*   BMET Recent Labs    06/27/22 0307 06/28/22 0324  NA 144 146*  K 3.9 4.0  CL 113* 117*  CO2 20* 20*  GLUCOSE 168* 118*  BUN 75* 70*  CREATININE 5.50* 5.22*  CALCIUM 7.6* 7.7*   PT/INR No results for input(s): "LABPROT", "INR" in the last 72 hours. CMP     Component Value Date/Time   NA 146 (H) 06/28/2022 0324   K 4.0 06/28/2022 0324   CL 117 (H) 06/28/2022 0324   CO2 20 (L) 06/28/2022 0324   GLUCOSE 118 (H) 06/28/2022 0324   BUN 70 (H) 06/28/2022 0324   CREATININE 5.22 (H) 06/28/2022 0324   CALCIUM 7.7 (L) 06/28/2022 0324   PROT 5.7 (L) 06/19/2022 0056   ALBUMIN 1.7 (L) 06/28/2022 0324   AST 39 06/19/2022 0056   ALT 24 06/19/2022 0056   ALKPHOS 65 06/19/2022 0056   BILITOT 1.6 (H) 06/19/2022 0056   GFRNONAA 10 (L) 06/28/2022 0324   GFRAA 31 (L) 12/03/2019 0345   Lipase     Component Value Date/Time   LIPASE 40 08/27/2021 0811    Studies/Results: No results found.  Anti-infectives: Anti-infectives (From admission, onward)    Start     Dose/Rate Route Frequency Ordered Stop    06/27/22 1230  fluconazole (DIFLUCAN) IVPB 100 mg        100 mg 50 mL/hr over 60 Minutes Intravenous Every 24 hours 06/27/22 1130 07/04/22 1229   06/22/22 2200  amoxicillin-clavulanate (AUGMENTIN) 500-125 MG per tablet 1 tablet        1 tablet Oral Daily at bedtime 06/22/22 1356 06/29/22 2159   06/22/22 0600  ceFAZolin (ANCEF) IVPB 2g/100 mL premix  Status:  Discontinued        2 g 200 mL/hr over 30 Minutes Intravenous On call to O.R. 06/21/22 1829 06/21/22 1830   06/21/22 1700  Ampicillin-Sulbactam (UNASYN) 3 g in sodium chloride 0.9 % 100 mL IVPB  Status:  Discontinued        3 g 200 mL/hr over 30 Minutes Intravenous Every 12 hours 06/21/22 1435 06/22/22 1356   06/18/22 1900  linezolid (ZYVOX) IVPB 600 mg  Status:  Discontinued        600 mg 300 mL/hr over 60 Minutes Intravenous Every 12 hours 06/18/22 1702 06/22/22 1134   06/18/22 1715  meropenem (MERREM) 1 g in sodium chloride 0.9 % 100 mL IVPB  Status:  Discontinued        1 g 200 mL/hr over 30 Minutes Intravenous Every  12 hours 06/18/22 1709 06/21/22 1411   06/18/22 0745  piperacillin-tazobactam (ZOSYN) IVPB 3.375 g        3.375 g 100 mL/hr over 30 Minutes Intravenous  Once 06/18/22 9244 06/18/22 0835        Assessment/Plan POD 10/8, s/p excisional debridement of NSTI of right groin/thigh, Dr. Toy Cookey, Dr. Mable Fill (ortho) - Cx w/ moderate strep anginosis w/ mixed anaerobic flora present. Cont abx therapy per ID (they recommend augmentin now) - WBC down-trending 20. Her wound looks clean and there does not appear to be anything further that needs debridement. She is HDS without fever, tachycardia or hypotension. - Cont NS WD dressing changes to thigh wound BID, but change groin wound to dry dressing changes given amount of moisture the wound produces - doing well with vicodin and gabapentin. -surgically stable for DC.  CIR eval pending.  HH is arranged if CIR doesn't work out. -follow up being arranged in our office with  Dr. Kieth Brightly in 3 weeks. -d/w primary service   FEN - renal carb mod diet VTE - SCDs, subq heparin ID - Augmentin, per ID Foley - purewick  Acute on CKD - per nephrology  HTN DM Obesity OSA   The patient has been seen, last 24 hours of vitals, labs, and chart reviewed.   LOS: 10 days    Henreitta Cea , [facility] 06/28/2022, 9:27 AM Please see Amion for pager number during day hours 7:00am-4:30pm

## 2022-06-28 NOTE — Plan of Care (Signed)

## 2022-06-28 NOTE — Progress Notes (Signed)
Physical Therapy Treatment Patient Details Name: Jacqueline [name] MRN: [id number] DOB: July 18, 1981 Today's Date: 06/28/2022   History of Present Illness Patient is 41 y.o. female admitted 12/02  for Rt psoas abscess and pelvic necrotizing fasciitis extendign to Rt thigh. Now s/p excisional debridement of skin, muscle (iliacus, psoas, transversus abdominis), fascia of pelvis, abdominal wall and retroperitoneum dissection on 12/2. PMHx: anemia, axiety, HTN, and DMII.    PT Comments    Patient making excellent progress with mobility and remains highly motivated to participate in therapy. Pt was able to complete bed mobility with  guarding only and use of Ue's to bring Rt LE off EOB. Pt required min assist to rise from EOB and was able to stabilize balance with RW. Gait distance increased to ~140' and pt progressed to step through pattern with shortened stride length, no buckling throughout bout in hallway. Pt completed transfer on low toilet height. Unable to rise without +2 mod-max assist from low height and discussed with RN and patient to use elevated BSC for toileting. Will continue to progress mobility as able. Continue to recommend intense therapy follow up at AIR setting to maximize return of functional independence.    Recommendations for follow up therapy are one component of a multi-disciplinary discharge planning process, led by the attending physician.  Recommendations may be updated based on patient status, additional functional criteria and insurance authorization.  Follow Up Recommendations  Acute inpatient rehab (3hours/day)     Assistance Recommended at Discharge Frequent or constant Supervision/Assistance  Patient can return home with the following A lot of help with walking and/or transfers;A lot of help with bathing/dressing/bathroom;Assistance with cooking/housework;Direct supervision/assist for medications management;Assist for transportation;Help with stairs or ramp for entrance    Equipment Recommendations  Rolling walker (2 wheels);Wheelchair (measurements PT);Wheelchair cushion (measurements PT)    Recommendations for Other Services Rehab consult     Precautions / Restrictions Precautions Precautions: Fall Precaution Comments: R knee buckle, watch BP Restrictions Weight Bearing Restrictions: No RLE Weight Bearing: Weight bearing as tolerated     Mobility  Bed Mobility Overal bed mobility: Needs Assistance Bed Mobility: Supine to Sit     Supine to sit: HOB elevated, Min guard     General bed mobility comments: cues to use bed rail, pt taking extra time and using UE's to assist Rt LE off bed. guarding for safety.    Transfers Overall transfer level: Needs assistance Equipment used: Rolling walker (2 wheels) Transfers: Sit to/from Stand Sit to Stand: Min assist, +2 physical assistance, Max assist           General transfer comment: Min assist for power up from EOB, pt able to initiate assist needed to steady with full rise to walker. pt required +2 Max assist from low toilet height.    Ambulation/Gait Ambulation/Gait assistance: Min assist Gait Distance (Feet): 140 Feet Assistive device: Rolling walker (2 wheels) Gait Pattern/deviations: Step-to pattern, Decreased stance time - right, Decreased stride length, Decreased weight shift to right, Knees buckling, Shuffle, Trunk flexed, Step-through pattern Gait velocity: slow     General Gait Details: pt leading with Lt LE to load Rt for advancement. pt required min assist to steady walker and cues to maintain safe position. pt ambualting with step to pattern and halfway transitioned to step through. Step length decreased but no buckling noted with step through gait pattern.   Stairs             Wheelchair Mobility    Modified Rankin (Stroke Patients  Only)       Balance Overall balance assessment: Needs assistance Sitting-balance support: Feet supported, No upper extremity  supported Sitting balance-Leahy Scale: Good Sitting balance - Comments: pt able to shift to complete pericare on toilet   Standing balance support: Bilateral upper extremity supported, During functional activity Standing balance-Leahy Scale: Poor Standing balance comment: relies on BUE support                            Cognition Arousal/Alertness: Awake/alert Behavior During Therapy: WFL for tasks assessed/performed Overall Cognitive Status: Within Functional Limits for tasks assessed                                          Exercises      General Comments        Pertinent Vitals/Pain Pain Assessment Pain Assessment: 0-10 Pain Score: 7  Pain Location: Rt thigh Pain Descriptors / Indicators: Discomfort Pain Intervention(s): Limited activity within patient's tolerance, Monitored during session, Repositioned    Home Living                          Prior Function            PT Goals (current goals can now be found in the care plan section) Acute Rehab PT Goals Patient Stated Goal: Get well PT Goal Formulation: With patient Time For Goal Achievement: 07/06/22 Potential to Achieve Goals: Good Progress towards PT goals: Progressing toward goals    Frequency    Min 4X/week      PT Plan Discharge plan needs to be updated    Co-evaluation              AM-PAC PT "6 Clicks" Mobility   Outcome Measure  Help needed turning from your back to your side while in a flat bed without using bedrails?: A Little Help needed moving from lying on your back to sitting on the side of a flat bed without using bedrails?: A Little Help needed moving to and from a bed to a chair (including a wheelchair)?: A Little Help needed standing up from a chair using your arms (e.g., wheelchair or bedside chair)?: A Little Help needed to walk in hospital room?: A Little Help needed climbing 3-5 steps with a railing? : A Lot 6 Click Score: 17     End of Session Equipment Utilized During Treatment: Gait belt Activity Tolerance: Patient limited by fatigue Patient left: in bed;with call bell/phone within reach;with bed alarm set;with family/visitor present   PT Visit Diagnosis: Other abnormalities of gait and mobility (R26.89);Muscle weakness (generalized) (M62.81);Difficulty in walking, not elsewhere classified (R26.2)     Time: 2947-6546 PT Time Calculation (min) (ACUTE ONLY): 31 min  Charges:  $Gait Training: 8-22 mins $Therapeutic Activity: 8-22 mins                     Verner Mould, DPT Acute Rehabilitation Services Office [phone]  06/28/22 12:17 PM

## 2022-06-28 NOTE — Progress Notes (Signed)
PROGRESS NOTE        PATIENT DETAILS Name: Jacqueline [name] Last Age: 41 y.o. Sex: female Date of Birth: Jun 21, 1981 Admit Date: 06/18/2022 Admitting Physician Arta Bruce Kinsinger, MD OTL:[id number], Larey Dresser, MD  Brief Summary: Patient is a 41 y.o.  female with history of CKD stage IV, DM-2, HTN, s/p gastric bypass 2020-presenting with severe sepsis due to necrotizing fasciitis of right thigh/groin-evaluated by orthopedics/general surgery-and underwent right thigh I&D/debridement-and was subsequently admitted by PCCM postoperatively.  She was stabilized and subsequently transferred to [facility] on 12/5.  Significant events: 12/2>> presenting with right thigh necrotizing fasciitis-s/p debridement by Ortho/general surgery-admit to ICU postoperatively. 12/4>> irrigation/debridement by general surgery 12/5>> transferred to [facility].  Significant studies: 12/2>> CT abdomen/pelvis: Findings concerning for necrotizing fasciitis in the right thigh.  Probable right psoas muscle abscess. 12/2>> CT of the right lower extremity: Necrotizing fasciitis/infectious myositis of right thigh.  Significant microbiology data: 12/2>> COVID/influenza PCR: Negative 12/2>> right thigh tissue: Streptococcus anginosus/mixed anaerobic flora. 12/2>> blood culture: No growth  Procedures: 12/2>> right thigh I&D by Ortho, advised pelvis I&D by general surgery 12/4>> right thigh/groin irrigation and debridement  Consults: Orthopedics General surgery Infectious disease Nephrology PCCM  Subjective: Thigh pain is better.  Swallowing is much better-no oral thrush evident today.  Objective: Vitals: Blood pressure (!) 141/83, pulse 93, temperature 97.6 F (36.4 C), temperature source Oral, resp. rate (!) 24, height 5\' 5"  (1.651 m), weight 91.5 kg, last menstrual period 06/15/2022, SpO2 98 %.   Exam: Gen Exam:Alert awake-not in any distress HEENT:atraumatic, normocephalic Chest: B/L clear to  auscultation anteriorly CVS:S1S2 regular Abdomen:soft non tender, non distended Extremities:no edema Neurology: Non focal Skin: no rash  Pertinent Labs/Radiology:    Latest Ref Rng & Units 06/28/2022    3:24 AM 06/27/2022    3:07 AM 06/26/2022   11:50 AM  CBC  WBC 4.0 - 10.5 K/uL 20.6  22.0  24.5   Hemoglobin 12.0 - 15.0 g/dL 10.0  9.9  10.8   Hematocrit 36.0 - 46.0 % 29.9  29.6  32.8   Platelets 150 - 400 K/uL 146  115  110     Lab Results  Component Value Date   NA 146 (H) 06/28/2022   K 4.0 06/28/2022   CL 117 (H) 06/28/2022   CO2 20 (L) 06/28/2022      Assessment/Plan: Severe sepsis due to necrotizing fasciitis involving right thigh/pelvis Sepsis physiology has resolved-s/p I&D on 12/2 and 12/4 Leukocytosis slowly downtrending Per general surgery-surgical site looks clean without any evidence of infection  Evaluated by ID-has been transitioned to oral Augmentin. Oral narcotics escalated 12/11 with better response-continue to minimize use of IV Dilaudid as much as possible.  AKI on CKD 4 AKI-likely due to ischemic ATN in the setting of sepsis physiology Nausea/vomiting better-thought to be from narcotics/post surgical issues rather than uremia.   Renal function gradually improving Nephrology following.  Hyperkalemia Resolved  Metabolic acidosis Due to CKD On oral bicarb supplementation  Oral candidiasis Continue Diflucan-significant improvement in oral/esophageal symptoms Follow.  Normocytic anemia Due to a combination of anemia due to CKD and acute illness. Darbepoetin/iron defer to nephrology service Follow CBC  HTN BP better controlled  Continue amlodipine 10 mg daily.   DM-2 (A1c 5.4 on 12/5) with hyperglycemia Not on any medications at home Monitor closely on extra sensitive SSI  Recent Labs  06/27/22 1630 06/27/22 2057 06/28/22 0815  GLUCAP 170* 162* 167*     Hyponatremia Mild Follow-up for  now  Depression Stable Zoloft  BMI: Estimated body mass index is 33.57 kg/m as calculated from the following:   Height as of this encounter: 5\' 5"  (1.651 m).   Weight as of this encounter: 91.5 kg.   Code status:   Code Status: Prior   DVT Prophylaxis: heparin injection 5,000 Units Start: 06/18/22 1715   Family Communication: Spouse at bedside.   Disposition Plan: Status is: Inpatient Remains inpatient appropriate because: Severity of illness   Planned Discharge Destination: Await CIR bed.  Medically approaching stability for discharge.   Diet: Diet Order             Diet regular Room service appropriate? Yes; Fluid consistency: Thin  Diet effective now                     Antimicrobial agents: Anti-infectives (From admission, onward)    Start     Dose/Rate Route Frequency Ordered Stop   06/27/22 1230  fluconazole (DIFLUCAN) IVPB 100 mg        100 mg 50 mL/hr over 60 Minutes Intravenous Every 24 hours 06/27/22 1130 07/04/22 1229   06/22/22 2200  amoxicillin-clavulanate (AUGMENTIN) 500-125 MG per tablet 1 tablet        1 tablet Oral Daily at bedtime 06/22/22 1356 06/29/22 2159   06/22/22 0600  ceFAZolin (ANCEF) IVPB 2g/100 mL premix  Status:  Discontinued        2 g 200 mL/hr over 30 Minutes Intravenous On call to O.R. 06/21/22 1829 06/21/22 1830   06/21/22 1700  Ampicillin-Sulbactam (UNASYN) 3 g in sodium chloride 0.9 % 100 mL IVPB  Status:  Discontinued        3 g 200 mL/hr over 30 Minutes Intravenous Every 12 hours 06/21/22 1435 06/22/22 1356   06/18/22 1900  linezolid (ZYVOX) IVPB 600 mg  Status:  Discontinued        600 mg 300 mL/hr over 60 Minutes Intravenous Every 12 hours 06/18/22 1702 06/22/22 1134   06/18/22 1715  meropenem (MERREM) 1 g in sodium chloride 0.9 % 100 mL IVPB  Status:  Discontinued        1 g 200 mL/hr over 30 Minutes Intravenous Every 12 hours 06/18/22 1709 06/21/22 1411   06/18/22 0745  piperacillin-tazobactam (ZOSYN) IVPB 3.375  g        3.375 g 100 mL/hr over 30 Minutes Intravenous  Once 06/18/22 0733 06/18/22 0835        MEDICATIONS: Scheduled Meds:  (feeding supplement) PROSource Plus  30 mL Oral BID BM   acetaminophen  325 mg Oral Q6H   amLODipine  10 mg Oral Daily   amoxicillin-clavulanate  1 tablet Oral QHS   calcitRIOL  0.25 mcg Oral Daily   Chlorhexidine Gluconate Cloth  6 each Topical Daily   darbepoetin (ARANESP) injection - NON-DIALYSIS  100 mcg Subcutaneous Q Mon-1800   feeding supplement  237 mL Oral BID BM   gabapentin  100 mg Oral TID   heparin injection (subcutaneous)  5,000 Units Subcutaneous Q8H   insulin aspart  0-5 Units Subcutaneous QHS   nystatin   Topical TID   pantoprazole  40 mg Oral QHS   sertraline  100 mg Oral QHS   sodium bicarbonate  1,300 mg Oral BID   Continuous Infusions:  fluconazole (DIFLUCAN) IV Stopped (06/27/22 1311)    PRN Meds:.HYDROcodone-acetaminophen, HYDROmorphone (DILAUDID)  injection, ondansetron (ZOFRAN) IV, phenol   I have personally reviewed following labs and imaging studies  LABORATORY DATA: CBC: Recent Labs  Lab 06/24/22 0346 06/25/22 0223 06/26/22 1150 06/27/22 0307 06/28/22 0324  WBC 23.6* 22.5* 24.5* 22.0* 20.6*  NEUTROABS  --   --   --  17.6*  --   HGB 10.3* 11.4* 10.8* 9.9* 10.0*  HCT 30.4* 33.1* 32.8* 29.6* 29.9*  MCV 89.9 89.0 91.1 90.0 89.0  PLT 128* 125* 110* 115* 146*     Basic Metabolic Panel: Recent Labs  Lab 06/24/22 0346 06/25/22 0223 06/26/22 1150 06/27/22 0307 06/28/22 0324  NA 138 143 144 144 146*  K 3.9 4.1 4.0 3.9 4.0  CL 109 114* 115* 113* 117*  CO2 18* 18* 18* 20* 20*  GLUCOSE 102* 85 147* 168* 118*  BUN 90* 88* 81* 75* 70*  CREATININE 6.92* 6.50* 5.77* 5.50* 5.22*  CALCIUM 7.6* 7.5* 7.7* 7.6* 7.7*  PHOS 7.0* 5.9* 5.8* 5.6* 5.3*     GFR: Estimated Creatinine Clearance: 15.7 mL/min (A) (by C-G formula based on SCr of 5.22 mg/dL (H)).  Liver Function Tests: Recent Labs  Lab 06/24/22 0346  06/25/22 0223 06/26/22 1150 06/27/22 0307 06/28/22 0324  ALBUMIN 1.5* 1.7* 1.9* 1.8* 1.7*    No results for input(s): "LIPASE", "AMYLASE" in the last 168 hours. No results for input(s): "AMMONIA" in the last 168 hours.  Coagulation Profile: No results for input(s): "INR", "PROTIME" in the last 168 hours.  Cardiac Enzymes: No results for input(s): "CKTOTAL", "CKMB", "CKMBINDEX", "TROPONINI" in the last 168 hours.  BNP (last 3 results) No results for input(s): "PROBNP" in the last 8760 hours.  Lipid Profile: No results for input(s): "CHOL", "HDL", "LDLCALC", "TRIG", "CHOLHDL", "LDLDIRECT" in the last 72 hours.  Thyroid Function Tests: No results for input(s): "TSH", "T4TOTAL", "FREET4", "T3FREE", "THYROIDAB" in the last 72 hours.  Anemia Panel: No results for input(s): "VITAMINB12", "FOLATE", "FERRITIN", "TIBC", "IRON", "RETICCTPCT" in the last 72 hours.  Urine analysis:    Component Value Date/Time   COLORURINE YELLOW 06/18/2022 0820   APPEARANCEUR HAZY (A) 06/18/2022 0820   LABSPEC 1.013 06/18/2022 0820   PHURINE 7.0 06/18/2022 0820   GLUCOSEU 50 (A) 06/18/2022 0820   HGBUR NEGATIVE 06/18/2022 0820   BILIRUBINUR NEGATIVE 06/18/2022 0820   KETONESUR NEGATIVE 06/18/2022 0820   PROTEINUR >=300 (A) 06/18/2022 0820   UROBILINOGEN 0.2 08/21/2012 2130   NITRITE NEGATIVE 06/18/2022 0820   LEUKOCYTESUR TRACE (A) 06/18/2022 0820    Sepsis Labs: Lactic Acid, Venous    Component Value Date/Time   LATICACIDVEN 2.4 ([place]) 06/18/2022 1755    MICROBIOLOGY: Recent Results (from the past 240 hour(s))  Aerobic/Anaerobic Culture w Gram Stain (surgical/deep wound)     Status: None   Collection Time: 06/18/22  3:04 PM   Specimen: PATH Other; Body Fluid  Result Value Ref Range Status   Specimen Description FLUID RIGHT THIGH  Final   Special Requests PT ON ANCEF  Final   Gram Stain   Final    RARE WBC PRESENT, PREDOMINANTLY PMN FEW GRAM POSITIVE COCCI FEW GRAM NEGATIVE  RODS Performed at [facility], [place] [address], [place], [place] [address]    Culture   Final    MODERATE STREPTOCOCCUS ANGINOSIS MIXED ANAEROBIC FLORA PRESENT.  CALL LAB IF FURTHER IID REQUIRED.    Report Status 06/21/2022 FINAL  Final   Organism ID, Bacteria STREPTOCOCCUS ANGINOSIS  Final      Susceptibility   Streptococcus anginosis - MIC*  PENICILLIN <=0.06 SENSITIVE Sensitive     CEFTRIAXONE <=0.12 SENSITIVE Sensitive     ERYTHROMYCIN 4 RESISTANT Resistant     LEVOFLOXACIN 0.5 SENSITIVE Sensitive     VANCOMYCIN 0.5 SENSITIVE Sensitive     * MODERATE STREPTOCOCCUS ANGINOSIS  MRSA Next Gen by PCR, Nasal     Status: None   Collection Time: 06/18/22  5:55 PM   Specimen: Nasal Mucosa; Nasal Swab  Result Value Ref Range Status   MRSA by PCR Next Gen NOT DETECTED NOT DETECTED Final    Comment: (NOTE) The GeneXpert MRSA Assay (FDA approved for NASAL specimens only), is one component of a comprehensive MRSA colonization surveillance program. It is not intended to diagnose MRSA infection nor to guide or monitor treatment for MRSA infections. Test performance is not FDA approved in patients less than 83 years old. Performed at [facility], [place] [address] [place], [place] [address]     RADIOLOGY STUDIES/RESULTS: No results found.   LOS: 10 days   Oren Binet, MD  [facility]    To contact the attending provider between 7A-7P or the covering provider during after hours 7P-7A, please log into the web site [email or web address] and access using universal [place] password for that web site. If you do not have the password, please call the hospital operator.  06/28/2022, 9:41 AM

## 2022-06-28 NOTE — Progress Notes (Signed)
    Inpatient Rehabilitation Admissions Coordinator   Met with patient and Mom at bedside for rehab assessment. We discussed goals and expectations of a possible CIR admit. They prefer CIR for rehab. Family can provide expected caregiver support that is recommended. I will begin insurance Auth with [employer] and [place] for possible CIR admit pending approval. Please call me with any questions.   Danne Baxter, RN, MSN Rehab Admissions Coordinator [phone]

## 2022-06-29 ENCOUNTER — Other Ambulatory Visit: Payer: Self-pay

## 2022-06-29 ENCOUNTER — Encounter (HOSPITAL_COMMUNITY): Payer: Self-pay | Admitting: Physical Medicine & Rehabilitation

## 2022-06-29 ENCOUNTER — Inpatient Hospital Stay (HOSPITAL_COMMUNITY)
Admission: RE | Admit: 2022-06-29 | Discharge: 2022-07-08 | DRG: 945 | Disposition: A | Discharge status: Home / Self Care | Payer: BC Managed Care – PPO | Source: Intra-hospital | Attending: Physical Medicine & Rehabilitation | Admitting: Physical Medicine & Rehabilitation

## 2022-06-29 DIAGNOSIS — M726 Necrotizing fasciitis: Secondary | ICD-10-CM | POA: Diagnosis not present

## 2022-06-29 DIAGNOSIS — F419 Anxiety disorder, unspecified: Secondary | ICD-10-CM | POA: Diagnosis present

## 2022-06-29 DIAGNOSIS — Z8349 Family history of other endocrine, nutritional and metabolic diseases: Secondary | ICD-10-CM | POA: Diagnosis not present

## 2022-06-29 DIAGNOSIS — I480 Paroxysmal atrial fibrillation: Secondary | ICD-10-CM | POA: Diagnosis present

## 2022-06-29 DIAGNOSIS — R609 Edema, unspecified: Secondary | ICD-10-CM | POA: Diagnosis not present

## 2022-06-29 DIAGNOSIS — M729 Fibroblastic disorder, unspecified: Secondary | ICD-10-CM | POA: Diagnosis not present

## 2022-06-29 DIAGNOSIS — E1122 Type 2 diabetes mellitus with diabetic chronic kidney disease: Secondary | ICD-10-CM | POA: Diagnosis not present

## 2022-06-29 DIAGNOSIS — E785 Hyperlipidemia, unspecified: Secondary | ICD-10-CM | POA: Diagnosis present

## 2022-06-29 DIAGNOSIS — B370 Candidal stomatitis: Secondary | ICD-10-CM | POA: Diagnosis present

## 2022-06-29 DIAGNOSIS — E875 Hyperkalemia: Secondary | ICD-10-CM | POA: Diagnosis present

## 2022-06-29 DIAGNOSIS — F4321 Adjustment disorder with depressed mood: Secondary | ICD-10-CM

## 2022-06-29 DIAGNOSIS — E1142 Type 2 diabetes mellitus with diabetic polyneuropathy: Secondary | ICD-10-CM | POA: Diagnosis not present

## 2022-06-29 DIAGNOSIS — E1110 Type 2 diabetes mellitus with ketoacidosis without coma: Secondary | ICD-10-CM | POA: Diagnosis not present

## 2022-06-29 DIAGNOSIS — I130 Hypertensive heart and chronic kidney disease with heart failure and stage 1 through stage 4 chronic kidney disease, or unspecified chronic kidney disease: Secondary | ICD-10-CM | POA: Diagnosis not present

## 2022-06-29 DIAGNOSIS — E1169 Type 2 diabetes mellitus with other specified complication: Secondary | ICD-10-CM | POA: Diagnosis not present

## 2022-06-29 DIAGNOSIS — N184 Chronic kidney disease, stage 4 (severe): Secondary | ICD-10-CM | POA: Diagnosis present

## 2022-06-29 DIAGNOSIS — Z79899 Other long term (current) drug therapy: Secondary | ICD-10-CM

## 2022-06-29 DIAGNOSIS — I5032 Chronic diastolic (congestive) heart failure: Secondary | ICD-10-CM | POA: Diagnosis present

## 2022-06-29 DIAGNOSIS — E872 Acidosis, unspecified: Secondary | ICD-10-CM | POA: Diagnosis present

## 2022-06-29 DIAGNOSIS — M7989 Other specified soft tissue disorders: Secondary | ICD-10-CM | POA: Diagnosis not present

## 2022-06-29 DIAGNOSIS — F32A Depression, unspecified: Secondary | ICD-10-CM | POA: Diagnosis present

## 2022-06-29 DIAGNOSIS — N170 Acute kidney failure with tubular necrosis: Secondary | ICD-10-CM | POA: Diagnosis present

## 2022-06-29 DIAGNOSIS — D62 Acute posthemorrhagic anemia: Secondary | ICD-10-CM | POA: Diagnosis not present

## 2022-06-29 DIAGNOSIS — Z9884 Bariatric surgery status: Secondary | ICD-10-CM

## 2022-06-29 DIAGNOSIS — Z8249 Family history of ischemic heart disease and other diseases of the circulatory system: Secondary | ICD-10-CM

## 2022-06-29 DIAGNOSIS — R5381 Other malaise: Principal | ICD-10-CM | POA: Diagnosis present

## 2022-06-29 DIAGNOSIS — R278 Other lack of coordination: Secondary | ICD-10-CM | POA: Diagnosis not present

## 2022-06-29 DIAGNOSIS — T8189XA Other complications of procedures, not elsewhere classified, initial encounter: Secondary | ICD-10-CM | POA: Diagnosis not present

## 2022-06-29 DIAGNOSIS — Z833 Family history of diabetes mellitus: Secondary | ICD-10-CM | POA: Diagnosis not present

## 2022-06-29 DIAGNOSIS — N281 Cyst of kidney, acquired: Secondary | ICD-10-CM | POA: Diagnosis not present

## 2022-06-29 DIAGNOSIS — Z6833 Body mass index (BMI) 33.0-33.9, adult: Secondary | ICD-10-CM

## 2022-06-29 DIAGNOSIS — N185 Chronic kidney disease, stage 5: Secondary | ICD-10-CM | POA: Diagnosis not present

## 2022-06-29 DIAGNOSIS — S71001S Unspecified open wound, right hip, sequela: Secondary | ICD-10-CM | POA: Diagnosis not present

## 2022-06-29 DIAGNOSIS — I120 Hypertensive chronic kidney disease with stage 5 chronic kidney disease or end stage renal disease: Secondary | ICD-10-CM | POA: Diagnosis not present

## 2022-06-29 DIAGNOSIS — I10 Essential (primary) hypertension: Secondary | ICD-10-CM | POA: Diagnosis not present

## 2022-06-29 LAB — CBC
HCT: 28.3 % — ABNORMAL LOW (ref 36.0–46.0)
Hemoglobin: 9.5 g/dL — ABNORMAL LOW (ref 12.0–15.0)
MCH: 29.6 pg (ref 26.0–34.0)
MCHC: 33.6 g/dL (ref 30.0–36.0)
MCV: 88.2 fL (ref 80.0–100.0)
Platelets: 161 10*3/uL (ref 150–400)
RBC: 3.21 MIL/uL — ABNORMAL LOW (ref 3.87–5.11)
RDW: 14.4 % (ref 11.5–15.5)
WBC: 20.2 10*3/uL — ABNORMAL HIGH (ref 4.0–10.5)
nRBC: 0.3 % — ABNORMAL HIGH (ref 0.0–0.2)

## 2022-06-29 LAB — RENAL FUNCTION PANEL
Albumin: 1.7 g/dL — ABNORMAL LOW (ref 3.5–5.0)
Anion gap: 8 (ref 5–15)
BUN: 64 mg/dL — ABNORMAL HIGH (ref 6–20)
CO2: 22 mmol/L (ref 22–32)
Calcium: 7.7 mg/dL — ABNORMAL LOW (ref 8.9–10.3)
Chloride: 115 mmol/L — ABNORMAL HIGH (ref 98–111)
Creatinine, Ser: 4.99 mg/dL — ABNORMAL HIGH (ref 0.44–1.00)
GFR, Estimated: 10 mL/min — ABNORMAL LOW (ref >60)
Glucose, Bld: 105 mg/dL — ABNORMAL HIGH (ref 70–99)
Phosphorus: 5.3 mg/dL — ABNORMAL HIGH (ref 2.5–4.6)
Potassium: 4.1 mmol/L (ref 3.5–5.1)
Sodium: 145 mmol/L (ref 135–145)

## 2022-06-29 LAB — GLUCOSE, CAPILLARY
Glucose-Capillary: 100 mg/dL — ABNORMAL HIGH (ref 70–99)
Glucose-Capillary: 158 mg/dL — ABNORMAL HIGH (ref 70–99)
Glucose-Capillary: 132 mg/dL — ABNORMAL HIGH (ref 70–99)

## 2022-06-29 MED ORDER — INSULIN ASPART 100 UNIT/ML IJ SOLN: 0.0000 [IU] | Freq: Every day | INTRAMUSCULAR | Status: DC

## 2022-06-29 MED ORDER — ACETAMINOPHEN 325 MG PO TABS
325.0000 mg | ORAL_TABLET | ORAL | Status: DC | PRN
  Administered 2022-06-30 – 2022-07-05 (×3): 650 mg via ORAL
  Filled 2022-06-29 (×3): qty 2

## 2022-06-29 MED ORDER — SERTRALINE HCL 100 MG PO TABS
100.0000 mg | ORAL_TABLET | Freq: Every day | ORAL | Status: DC
  Administered 2022-06-29 – 2022-07-07 (×9): 100 mg via ORAL
  Filled 2022-06-29 (×9): qty 1

## 2022-06-29 MED ORDER — HEPARIN SODIUM (PORCINE) 5000 UNIT/ML IJ SOLN: 5000.0000 [IU] | Freq: Three times a day (TID) | INTRAMUSCULAR | Status: DC

## 2022-06-29 MED ORDER — NYSTATIN 100000 UNIT/GM EX POWD: Freq: Three times a day (TID) | CUTANEOUS | 0 refills | Status: DC

## 2022-06-29 MED ORDER — NYSTATIN 100000 UNIT/GM EX POWD
Freq: Three times a day (TID) | CUTANEOUS | Status: DC
  Administered 2022-06-30: 1 via TOPICAL
  Filled 2022-06-29 (×2): qty 15

## 2022-06-29 MED ORDER — PROSOURCE PLUS PO LIQD
30.0000 mL | Freq: Two times a day (BID) | ORAL | Status: DC
  Administered 2022-06-30 – 2022-07-08 (×14): 30 mL via ORAL
  Filled 2022-06-29 (×15): qty 30

## 2022-06-29 MED ORDER — HYDROMORPHONE HCL 1 MG/ML IJ SOLN: 0.5000 mg | INTRAMUSCULAR | 0 refills | Status: DC | PRN

## 2022-06-29 MED ORDER — HEPARIN SODIUM (PORCINE) 5000 UNIT/ML IJ SOLN
5000.0000 [IU] | Freq: Three times a day (TID) | INTRAMUSCULAR | Status: DC
  Administered 2022-06-29 – 2022-07-08 (×24): 5000 [IU] via SUBCUTANEOUS
  Filled 2022-06-29 (×23): qty 1

## 2022-06-29 MED ORDER — GABAPENTIN 100 MG PO CAPS
100.0000 mg | ORAL_CAPSULE | Freq: Three times a day (TID) | ORAL | Status: DC
  Administered 2022-06-29 – 2022-06-30 (×2): 100 mg via ORAL
  Filled 2022-06-29 (×2): qty 1

## 2022-06-29 MED ORDER — ENSURE ENLIVE PO LIQD
237.0000 mL | Freq: Two times a day (BID) | ORAL | Status: DC
  Administered 2022-06-30 – 2022-07-04 (×7): 237 mL via ORAL

## 2022-06-29 MED ORDER — HYDROCODONE-ACETAMINOPHEN 5-325 MG PO TABS: 1.0000 | ORAL_TABLET | ORAL | 0 refills | Status: DC | PRN

## 2022-06-29 MED ORDER — CALCITRIOL 0.25 MCG PO CAPS
0.2500 ug | ORAL_CAPSULE | Freq: Every day | ORAL | Status: DC
  Administered 2022-06-30 – 2022-07-08 (×9): 0.25 ug via ORAL
  Filled 2022-06-29 (×9): qty 1

## 2022-06-29 MED ORDER — PANTOPRAZOLE SODIUM 40 MG PO TBEC
40.0000 mg | DELAYED_RELEASE_TABLET | Freq: Every day | ORAL | Status: DC
  Administered 2022-06-29 – 2022-07-07 (×9): 40 mg via ORAL
  Filled 2022-06-29 (×9): qty 1

## 2022-06-29 MED ORDER — PANTOPRAZOLE SODIUM 40 MG PO TBEC: 40.0000 mg | DELAYED_RELEASE_TABLET | Freq: Every day | ORAL | Status: DC

## 2022-06-29 MED ORDER — ENSURE ENLIVE PO LIQD: 237.0000 mL | Freq: Two times a day (BID) | ORAL | 12 refills | Status: DC

## 2022-06-29 MED ORDER — DARBEPOETIN ALFA 100 MCG/0.5ML IJ SOSY
100.0000 ug | PREFILLED_SYRINGE | INTRAMUSCULAR | Status: DC
  Administered 2022-07-04: 100 ug via SUBCUTANEOUS
  Filled 2022-06-29: qty 0.5

## 2022-06-29 MED ORDER — SODIUM BICARBONATE 650 MG PO TABS
1300.0000 mg | ORAL_TABLET | Freq: Two times a day (BID) | ORAL | Status: DC
  Administered 2022-06-29 – 2022-07-08 (×18): 1300 mg via ORAL
  Filled 2022-06-29 (×18): qty 2

## 2022-06-29 MED ORDER — AMLODIPINE BESYLATE 10 MG PO TABS
10.0000 mg | ORAL_TABLET | Freq: Every day | ORAL | Status: DC
  Administered 2022-06-30 – 2022-07-08 (×9): 10 mg via ORAL
  Filled 2022-06-29 (×9): qty 1

## 2022-06-29 MED ORDER — FLUCONAZOLE 100MG IVPB
100.0000 mg | INTRAVENOUS | Status: AC
  Administered 2022-06-30 – 2022-07-01 (×2): 100 mg via INTRAVENOUS
  Filled 2022-06-29 (×2): qty 50

## 2022-06-29 MED ORDER — FLUCONAZOLE 100 MG PO TABS: 100.0000 mg | ORAL_TABLET | Freq: Every day | ORAL | 0 refills | Status: DC

## 2022-06-29 MED ORDER — SODIUM BICARBONATE 650 MG PO TABS: 1300.0000 mg | ORAL_TABLET | Freq: Two times a day (BID) | ORAL | Status: DC

## 2022-06-29 MED ORDER — GABAPENTIN 100 MG PO CAPS: 100.0000 mg | ORAL_CAPSULE | Freq: Three times a day (TID) | ORAL | Status: DC

## 2022-06-29 MED ORDER — HYDROCODONE-ACETAMINOPHEN 5-325 MG PO TABS
1.0000 | ORAL_TABLET | ORAL | Status: DC | PRN
  Administered 2022-06-29 – 2022-07-08 (×25): 2 via ORAL
  Filled 2022-06-29 (×7): qty 2
  Filled 2022-06-29: qty 1
  Filled 2022-06-29 (×5): qty 2
  Filled 2022-06-29: qty 1
  Filled 2022-06-29 (×12): qty 2

## 2022-06-29 NOTE — Progress Notes (Signed)
Inpatient Rehabilitation Admissions Coordinator    Await insurance approval for possible CIR admit.  Danne Baxter, RN, MSN Rehab Admissions Coordinator [phone] 06/29/2022 10:05 AM

## 2022-06-29 NOTE — Progress Notes (Signed)
Physical Therapy Treatment Patient Details Name: Jacqueline [name] MRN: [id number] DOB: 09-02-1980 Today's Date: 06/29/2022   History of Present Illness Patient is 41 y.o. female admitted 12/02  for Rt psoas abscess and pelvic necrotizing fasciitis extendign to Rt thigh. Now s/p excisional debridement of skin, muscle (iliacus, psoas, transversus abdominis), fascia of pelvis, abdominal wall and retroperitoneum dissection on 12/2. PMHx: anemia, axiety, HTN, and DMII.    PT Comments    Pt was seen for mobility on RW with help to avoid any loss of balance, but no buckling noted on R knee today.  Per pt, did not happen on her last session either.  Pt is able to get OOB alone, but requires help to return to bed mainly over her Abd surgery.  Pt is motivated for CIR and will anticipate her to make excellent progress and return home with family, who are very supportive.  Follow for rehab goals and progress her as needed and tolerated to walk and strengthen on LLE.  Discussed body mechanics for injury/surgery.   Recommendations for follow up therapy are one component of a multi-disciplinary discharge planning process, led by the attending physician.  Recommendations may be updated based on patient status, additional functional criteria and insurance authorization.  Follow Up Recommendations  Acute inpatient rehab (3hours/day)     Assistance Recommended at Discharge Frequent or constant Supervision/Assistance  Patient can return home with the following A lot of help with bathing/dressing/bathroom;Assistance with cooking/housework;Direct supervision/assist for medications management;Assist for transportation;Help with stairs or ramp for entrance;A little help with walking and/or transfers   Equipment Recommendations  Rolling walker (2 wheels);Wheelchair (measurements PT);Wheelchair cushion (measurements PT)    Recommendations for Other Services Rehab consult     Precautions / Restrictions  Precautions Precautions: Fall Precaution Comments: R knee buckle, watch BP Restrictions Weight Bearing Restrictions: No RLE Weight Bearing: Weight bearing as tolerated     Mobility  Bed Mobility Overal bed mobility: Needs Assistance Bed Mobility: Supine to Sit, Sit to Supine     Supine to sit: Min guard Sit to supine: Min assist   General bed mobility comments: min assist for legs    Transfers Overall transfer level: Needs assistance Equipment used: Rolling walker (2 wheels) Transfers: Sit to/from Stand Sit to Stand: Min guard, Min assist           General transfer comment: min assist to get up and min guard for safety of initial balance    Ambulation/Gait Ambulation/Gait assistance: Min guard Gait Distance (Feet): 140 Feet Assistive device: Rolling walker (2 wheels) Gait Pattern/deviations: Step-through pattern, Wide base of support, Decreased stance time - right Gait velocity: slow Gait velocity interpretation: <1.31 ft/sec, indicative of household ambulator Pre-gait activities: standing balance ck General Gait Details: step through with cues for safety, lost part of packing on the walk   Stairs             Wheelchair Mobility    Modified Rankin (Stroke Patients Only)       Balance Overall balance assessment: Needs assistance Sitting-balance support: Feet supported Sitting balance-Leahy Scale: Good     Standing balance support: Bilateral upper extremity supported, During functional activity Standing balance-Leahy Scale: Poor                              Cognition Arousal/Alertness: Awake/alert Behavior During Therapy: WFL for tasks assessed/performed Overall Cognitive Status: Within Functional Limits for tasks assessed  Exercises      General Comments General comments (skin integrity, edema, etc.): pt was assisted to get up to side of bed then progress to walk with  min guard support, no signs of buckling      Pertinent Vitals/Pain Pain Assessment Pain Assessment: Faces Faces Pain Scale: Hurts a little bit Pain Location: abdomen Pain Descriptors / Indicators: Guarding Pain Intervention(s): Monitored during session, Premedicated before session, Repositioned    Home Living                          Prior Function            PT Goals (current goals can now be found in the care plan section) Progress towards PT goals: Progressing toward goals    Frequency    Min 4X/week      PT Plan Current plan remains appropriate    Co-evaluation              AM-PAC PT "6 Clicks" Mobility   Outcome Measure  Help needed turning from your back to your side while in a flat bed without using bedrails?: A Little Help needed moving from lying on your back to sitting on the side of a flat bed without using bedrails?: A Little Help needed moving to and from a bed to a chair (including a wheelchair)?: A Little Help needed standing up from a chair using your arms (e.g., wheelchair or bedside chair)?: A Little Help needed to walk in hospital room?: A Little Help needed climbing 3-5 steps with a railing? : A Lot 6 Click Score: 17    End of Session Equipment Utilized During Treatment: Gait belt Activity Tolerance: Patient limited by fatigue Patient left: in bed;with call bell/phone within reach;with bed alarm set;with family/visitor present Nurse Communication: Mobility status PT Visit Diagnosis: Other abnormalities of gait and mobility (R26.89);Muscle weakness (generalized) (M62.81);Difficulty in walking, not elsewhere classified (R26.2)     Time: 1411-1440 PT Time Calculation (min) (ACUTE ONLY): 29 min  Charges:  $Gait Training: 8-22 mins $Therapeutic Activity: 8-22 mins         Ramond Dial 06/29/2022, 4:10 PM  Mee Hives, PT PhD Acute Rehab Dept. Number: [place] [facility] [place]

## 2022-06-29 NOTE — Progress Notes (Addendum)
Patient ID: Jacqueline [name], female   DOB: 06/19/1981, 41 y.o.   MRN: [id number]  INPATIENT REHABILITATION ADMISSION NOTE   Arrival Method: bed     Mental Orientation: x4   Assessment: see flowsheet   Skin: see flowsheet   IV'S: Left forearm   Pain: reported, previous medication prior to transfer   Tubes and Drains: n/a   Safety Measures: in place   Vital Signs: see flowsheet   Height and Weight: see flowsheet   Rehab Orientation: completed   Family: to be at bedside    Notes: Groin wound is 6 cm x 8 cm, tunnel of 7.5 cm. Thigh wound is 20 cm x 11.8 cm, tunnel 4 cm at 4 o'clock. Repacked wound with moist gauze, abd pads.

## 2022-06-29 NOTE — Discharge Summary (Signed)
PATIENT DETAILS Name: Jacqueline [name] Last Age: 41 y.o. Sex: female Date of Birth: 11-16-1980 MRN: [id number]. Admitting Physician: Mickeal Skinner, MD CVE:[id number], Larey Dresser, MD  Admit Date: 06/18/2022 Discharge date: 06/29/2022  Recommendations for Outpatient Follow-up:  Follow up with PCP in 1-2 weeks Please obtain CMP/CBC in one week Please ensure follow up with gen surgery  Admitted From:  Home  Disposition: CIR   Discharge Condition: fair  CODE STATUS:   Code Status: Prior   Diet recommendation:  Diet Order             Diet - low sodium heart healthy           Diet Carb Modified           Diet heart healthy/carb modified Room service appropriate? Yes; Fluid consistency: Thin; Fluid restriction: 1500 mL Fluid  Diet effective now                    Brief Summary: Patient is a 41 y.o.  female with history of CKD stage IV, DM-2, HTN, s/p gastric bypass 2020-presenting with severe sepsis due to necrotizing fasciitis of right thigh/groin-evaluated by orthopedics/general surgery-and underwent right thigh I&D/debridement-and was subsequently admitted by PCCM postoperatively.  She was stabilized and subsequently transferred to [facility] on 12/5.   Significant events: 12/2>> presenting with right thigh necrotizing fasciitis-s/p debridement by Ortho/general surgery-admit to ICU postoperatively. 12/4>> irrigation/debridement by general surgery 12/5>> transferred to [facility].   Significant studies: 12/2>> CT abdomen/pelvis: Findings concerning for necrotizing fasciitis in the right thigh.  Probable right psoas muscle abscess. 12/2>> CT of the right lower extremity: Necrotizing fasciitis/infectious myositis of right thigh.   Significant microbiology data: 12/2>> COVID/influenza PCR: Negative 12/2>> right thigh tissue: Streptococcus anginosus/mixed anaerobic flora. 12/2>> blood culture: No growth   Procedures: 12/2>> right thigh I&D by Ortho, advised pelvis I&D by general  surgery 12/4>> right thigh/groin irrigation and debridement   Consults: Orthopedics General surgery Infectious disease Nephrology PCCM    Brief Hospital Course: Severe sepsis due to necrotizing fasciitis involving right thigh/pelvis Sepsis physiology has resolved-s/p I&D on 12/2 and 12/4 Leukocytosis slowly downtrending Per general surgery-surgical site looks clean without any evidence of infection  Evaluated by ID-transitioned to oral Augmentin-until 12/12 Oral narcotics escalated 12/11 with better response-but continues to require IV Dilaudid for pain control--especially for dressing changes.   AKI on CKD 4 AKI-likely due to ischemic ATN in the setting of sepsis physiology Renal function gradually improving Nephrology following.   Nausea/vomiting Likely due to postop issues/narcotics Having almost daily BMs-benign abdominal exam-doubt SBO Not felt to be uremia as BUN/creatinine improving. Supportive care   Hyperkalemia Resolved   Metabolic acidosis Due to CKD On oral bicarb supplementation   Oral candidiasis Continue Diflucan-significant improvement in oral/esophageal symptoms Follow.   Normocytic anemia Due to a combination of anemia due to CKD and acute illness. Darbepoetin/iron defer to nephrology service Follow CBC   HTN BP better controlled  Continue amlodipine 10 mg daily.    DM-2 (A1c 5.4 on 12/5) with hyperglycemia Not on any medications at home Monitor closely on extra sensitive SSI  Hyponatremia Mild Follow-up for now   Depression Stable Zoloft   BMI: Estimated body mass index is 33.57 kg/m as calculated from the following:   Height as of this encounter: 5\' 5"  (1.651 m).   Weight as of this encounter: 91.5 kg.    Discharge Diagnoses:  Principal Problem:   Fasciitis Active Problems:   Sepsis ([place])   Acute  renal failure superimposed on stage 3 chronic kidney disease (HCC)   Diabetes ([place])   Type II diabetes mellitus with renal  manifestations [facility])   Discharge Instructions:  Activity:  As tolerated with Full fall precautions use walker/cane & assistance as needed   Discharge Instructions     Call MD for:  persistant nausea and vomiting   Complete by: As directed    Call MD for:  redness, tenderness, or signs of infection (pain, swelling, redness, odor or green/yellow discharge around incision site)   Complete by: As directed    Call MD for:  severe uncontrolled pain   Complete by: As directed    Diet - low sodium heart healthy   Complete by: As directed    Diet Carb Modified   Complete by: As directed    Discharge instructions   Complete by: As directed    Follow with Primary MD  Audley Hose, MD in 1-2 weeks  Please get a complete blood count and chemistry panel checked by your Primary MD at your next visit, and again as instructed by your Primary MD.  Get Medicines reviewed and adjusted: Please take all your medications with you for your next visit with your Primary MD  Laboratory/radiological data: Please request your Primary MD to go over all hospital tests and procedure/radiological results at the follow up, please ask your Primary MD to get all Hospital records sent to his/her office.  In some cases, they will be blood work, cultures and biopsy results pending at the time of your discharge. Please request that your primary care M.D. follows up on these results.  Also Note the following: If you experience worsening of your admission symptoms, develop shortness of breath, life threatening emergency, suicidal or homicidal thoughts you must seek medical attention immediately by calling 911 or calling your MD immediately  if symptoms less severe.  You must read complete instructions/literature along with all the possible adverse reactions/side effects for all the Medicines you take and that have been prescribed to you. Take any new Medicines after you have completely understood and accpet all  the possible adverse reactions/side effects.   Do not drive when taking Pain medications or sleeping medications (Benzodaizepines)  Do not take more than prescribed Pain, Sleep and Anxiety Medications. It is not advisable to combine anxiety,sleep and pain medications without talking with your primary care practitioner  Special Instructions: If you have smoked or chewed Tobacco  in the last 2 yrs please stop smoking, stop any regular Alcohol  and or any Recreational drug use.  Wear Seat belts while driving.  Please note: You were cared for by a hospitalist during your hospital stay. Once you are discharged, your primary care physician will handle any further medical issues. Please note that NO REFILLS for any discharge medications will be authorized once you are discharged, as it is imperative that you return to your primary care physician (or establish a relationship with a primary care physician if you do not have one) for your post hospital discharge needs so that they can reassess your need for medications and monitor your lab values.   Discharge wound care:   Complete by: As directed    NS WD dressing changes to right thigh wound with Kerlix moistened gauze and cover with ABD.  Pack groin wound with dry kerlix and cover with dry bandage due to excessive drainage from this wound   Increase activity slowly   Complete by: As directed  Allergies as of 06/29/2022   No Known Allergies      Medication List     STOP taking these medications    bumetanide 1 MG tablet Commonly known as: BUMEX   carvedilol 25 MG tablet Commonly known as: COREG   furosemide 40 MG tablet Commonly known as: LASIX   gentamicin cream 0.1 % Commonly known as: GARAMYCIN   methocarbamol 500 MG tablet Commonly known as: ROBAXIN       TAKE these medications    amLODipine 10 MG tablet Commonly known as: NORVASC Take 1 tablet (10 mg total) by mouth daily.   calcitRIOL 0.25 MCG  capsule Commonly known as: ROCALTROL Take 0.25 mcg by mouth daily.   diclofenac Sodium 1 % Gel Commonly known as: VOLTAREN Apply 2 g topically 4 (four) times daily as needed (pain).   feeding supplement Liqd Take 237 mLs by mouth 2 (two) times daily between meals. Start taking on: June 30, 2022   fluconazole 100 MG tablet Commonly known as: Diflucan Take 1 tablet (100 mg total) by mouth daily for 5 days.   folic acid 1 MG tablet Commonly known as: FOLVITE Take 1 mg by mouth daily.   gabapentin 100 MG capsule Commonly known as: NEURONTIN Take 1 capsule (100 mg total) by mouth 3 (three) times daily.   hydrALAZINE 10 MG tablet Commonly known as: APRESOLINE Take 10 mg by mouth daily.   HYDROcodone-acetaminophen 5-325 MG tablet Commonly known as: NORCO/VICODIN Take 1-2 tablets by mouth every 4 (four) hours as needed for moderate pain or severe pain.   HYDROmorphone 1 MG/ML injection Commonly known as: DILAUDID Inject 0.5-1 mLs (0.5-1 mg total) into the vein every 5 (five) hours as needed (breakthrough pain only and after getting all oral meds).   nystatin powder Commonly known as: MYCOSTATIN/NYSTOP Apply topically 3 (three) times daily. To inner thigh   pantoprazole 40 MG tablet Commonly known as: PROTONIX Take 1 tablet (40 mg total) by mouth at bedtime.   sertraline 100 MG tablet Commonly known as: ZOLOFT Take 100 mg by mouth at bedtime.   sodium bicarbonate 650 MG tablet Take 2 tablets (1,300 mg total) by mouth 2 (two) times daily.   Vitamin D (Ergocalciferol) 1.25 MG (50000 UNIT) Caps capsule Commonly known as: DRISDOL Take 50,000 Units by mouth every Thursday.               Discharge Care Instructions  (From admission, onward)           Start     Ordered   06/29/22 0000  Discharge wound care:       Comments: NS WD dressing changes to right thigh wound with Kerlix moistened gauze and cover with ABD.  Pack groin wound with dry kerlix and  cover with dry bandage due to excessive drainage from this wound   06/29/22 1405            Follow-up Information     Care, [facility] Follow up.   Specialty: Home Health Services Why: For [facility] RN PT OT. They will call you in 1-2 days after DC to start [facility] appointments Contact information: [address] [place] [address] [phone]         Kinsinger, Arta Bruce, MD Follow up on 07/20/2022.   Specialty: General Surgery Why: 11:10am, Arrive 30 minutes prior to your appointment time, Please bring your insurance card and photo ID Contact information: [address] [place] [address] [phone]  Rosita Fire, MD Follow up in 1 month(s).   Specialties: Nephrology, Internal Medicine Why: We will call with appointment details. Contact information: [place] [address] [phone]         Audley Hose, MD. Schedule an appointment as soon as possible for a visit in 1 week(s).   Specialty: Internal Medicine Contact information: [place] [address] [phone]                No Known Allergies   Other Procedures/Studies: CT EXTREMITY LOWER RIGHT WO CONTRAST  Result Date: 06/18/2022 CLINICAL DATA:  Necrotizing fasciitis EXAM: CT OF THE LOWER RIGHT EXTREMITY WITHOUT CONTRAST TECHNIQUE: Multidetector CT imaging of the right lower extremity was performed according to the standard protocol. Images obtained of the right femur, right tibia-fibula, and right foot. RADIATION DOSE REDUCTION: This exam was performed according to the departmental dose-optimization program which includes automated exposure control, adjustment of the mA and/or kV according to patient size and/or use of iterative reconstruction technique. COMPARISON:  Foot x-ray 06/18/2022.  Abdominopelvic CT 06/18/2022 FINDINGS: RIGHT FEMUR: Bones/Joint/Cartilage No acute fracture or dislocation. Right  hip joint is intact with mild joint space narrowing. No appreciable right hip joint effusion. No intra-articular gas in the right hip joint. No erosion or periosteal elevation. Knee joint alignment is maintained. Small-moderate size knee joint effusion, nonspecific. Ligaments Suboptimally assessed by CT. Muscles and Tendons Numerous foci of gas within the anterior compartment of the right thigh. Gas is predominantly located along the fascial planes where there is extensive fat stranding and trace fluid. There are also foci of gas located intramuscularly, notably involving the right sartorius and right iliopsoas muscles. Gas present within the sartorius muscle at the level of the distal femoral metaphysis (series 4, image 222). No well-defined intramuscular fluid collection. Grossly intact tendinous structures. Soft tissues Circumferential edema and ill-defined fluid throughout the right thigh. Soft tissue gas within the subcutaneous soft tissues of the right inguinal canal and anteromedial aspect of the proximal thigh. Enlarged right inguinal and right external iliac lymph nodes are likely reactive. Partially imaged complex collection in the pelvis along the right pelvic sidewall, better characterized by same day abdominopelvic CT. RIGHT TIBIA/FIBULA: Bones/Joint/Cartilage Tibia and fibula are intact without fracture or dislocation. No erosion or periosteal elevation. No tibiotalar joint effusion. Ligaments Suboptimally assessed by CT. Muscles and Tendons No intramuscular fluid collection within the lower leg musculature. No fascial or intramuscular gas. Soft tissues Circumferential subcutaneous edema and ill-defined fluid within the soft tissues of the lower leg. No organized fluid collection. No soft tissue gas. RIGHT FOOT: Bones/Joint/Cartilage Chronic-appearing bony changes of the midfoot which may be posttraumatic, neuropathic, or secondary to septic arthritis with prominent sclerosis and subchondral cysts or  erosions and areas of bony fragmentation. Bulky callus formation of the second through fourth metatarsals. No findings of an acute fracture or dislocation. Detailed evaluation is limited on large field-of-view whole lower extremity images. Ligaments Suboptimally assessed by CT. Muscles and Tendons Fatty atrophy of the foot musculature, likely related to denervation. No intramuscular gas. Soft tissues Generalized soft tissue swelling of the foot with fluid slightly more confluent along the medial aspect of the midfoot measuring approximately 3.3 x 1.3 cm (series 4, image 452). No discernible soft tissue wound or ulceration. No soft tissue gas within the foot. IMPRESSION: 1. Numerous foci of gas within the anterior compartment of the right thigh. Gas is predominantly located along the fascial planes where there  is extensive fat stranding and trace fluid. There are also foci of gas located within the right sartorius muscle and right iliopsoas musculature. Intramuscular gas within the sartorius muscle is seen tracking inferiorly to the level of the distal femoral metaphysis. Findings are consistent with necrotizing fasciitis and infectious myositis. 2. Findings of diffuse cellulitis of the right lower extremity extending from the proximal thigh to the foot. Gas within the superficial soft tissues is localized to the right inguinal region and anteromedial aspect of the proximal thigh. 3. Chronic-appearing bony changes of the midfoot which may be posttraumatic, neuropathic, and/or secondary to septic arthritis. 4. Partially imaged complex collection in the pelvis along the right pelvic sidewall, better characterized by same day abdominopelvic CT. 5. Small-moderate size knee joint effusion, nonspecific. 6. Enlarged right inguinal and right external iliac lymph nodes are likely reactive. Electronically Signed   By: Davina Poke D.O.   On: 06/18/2022 12:54   CT FEMUR RIGHT WO CONTRAST  Result Date:  06/18/2022 CLINICAL DATA:  Necrotizing fasciitis EXAM: CT OF THE LOWER RIGHT EXTREMITY WITHOUT CONTRAST TECHNIQUE: Multidetector CT imaging of the right lower extremity was performed according to the standard protocol. Images obtained of the right femur, right tibia-fibula, and right foot. RADIATION DOSE REDUCTION: This exam was performed according to the departmental dose-optimization program which includes automated exposure control, adjustment of the mA and/or kV according to patient size and/or use of iterative reconstruction technique. COMPARISON:  Foot x-ray 06/18/2022.  Abdominopelvic CT 06/18/2022 FINDINGS: RIGHT FEMUR: Bones/Joint/Cartilage No acute fracture or dislocation. Right hip joint is intact with mild joint space narrowing. No appreciable right hip joint effusion. No intra-articular gas in the right hip joint. No erosion or periosteal elevation. Knee joint alignment is maintained. Small-moderate size knee joint effusion, nonspecific. Ligaments Suboptimally assessed by CT. Muscles and Tendons Numerous foci of gas within the anterior compartment of the right thigh. Gas is predominantly located along the fascial planes where there is extensive fat stranding and trace fluid. There are also foci of gas located intramuscularly, notably involving the right sartorius and right iliopsoas muscles. Gas present within the sartorius muscle at the level of the distal femoral metaphysis (series 4, image 222). No well-defined intramuscular fluid collection. Grossly intact tendinous structures. Soft tissues Circumferential edema and ill-defined fluid throughout the right thigh. Soft tissue gas within the subcutaneous soft tissues of the right inguinal canal and anteromedial aspect of the proximal thigh. Enlarged right inguinal and right external iliac lymph nodes are likely reactive. Partially imaged complex collection in the pelvis along the right pelvic sidewall, better characterized by same day abdominopelvic CT.  RIGHT TIBIA/FIBULA: Bones/Joint/Cartilage Tibia and fibula are intact without fracture or dislocation. No erosion or periosteal elevation. No tibiotalar joint effusion. Ligaments Suboptimally assessed by CT. Muscles and Tendons No intramuscular fluid collection within the lower leg musculature. No fascial or intramuscular gas. Soft tissues Circumferential subcutaneous edema and ill-defined fluid within the soft tissues of the lower leg. No organized fluid collection. No soft tissue gas. RIGHT FOOT: Bones/Joint/Cartilage Chronic-appearing bony changes of the midfoot which may be posttraumatic, neuropathic, or secondary to septic arthritis with prominent sclerosis and subchondral cysts or erosions and areas of bony fragmentation. Bulky callus formation of the second through fourth metatarsals. No findings of an acute fracture or dislocation. Detailed evaluation is limited on large field-of-view whole lower extremity images. Ligaments Suboptimally assessed by CT. Muscles and Tendons Fatty atrophy of the foot musculature, likely related to denervation. No intramuscular gas. Soft tissues Generalized soft  tissue swelling of the foot with fluid slightly more confluent along the medial aspect of the midfoot measuring approximately 3.3 x 1.3 cm (series 4, image 452). No discernible soft tissue wound or ulceration. No soft tissue gas within the foot. IMPRESSION: 1. Numerous foci of gas within the anterior compartment of the right thigh. Gas is predominantly located along the fascial planes where there is extensive fat stranding and trace fluid. There are also foci of gas located within the right sartorius muscle and right iliopsoas musculature. Intramuscular gas within the sartorius muscle is seen tracking inferiorly to the level of the distal femoral metaphysis. Findings are consistent with necrotizing fasciitis and infectious myositis. 2. Findings of diffuse cellulitis of the right lower extremity extending from the  proximal thigh to the foot. Gas within the superficial soft tissues is localized to the right inguinal region and anteromedial aspect of the proximal thigh. 3. Chronic-appearing bony changes of the midfoot which may be posttraumatic, neuropathic, and/or secondary to septic arthritis. 4. Partially imaged complex collection in the pelvis along the right pelvic sidewall, better characterized by same day abdominopelvic CT. 5. Small-moderate size knee joint effusion, nonspecific. 6. Enlarged right inguinal and right external iliac lymph nodes are likely reactive. Electronically Signed   By: Davina Poke D.O.   On: 06/18/2022 12:54   CT TIBIA FIBULA RIGHT WO CONTRAST  Result Date: 06/18/2022 CLINICAL DATA:  Necrotizing fasciitis EXAM: CT OF THE LOWER RIGHT EXTREMITY WITHOUT CONTRAST TECHNIQUE: Multidetector CT imaging of the right lower extremity was performed according to the standard protocol. Images obtained of the right femur, right tibia-fibula, and right foot. RADIATION DOSE REDUCTION: This exam was performed according to the departmental dose-optimization program which includes automated exposure control, adjustment of the mA and/or kV according to patient size and/or use of iterative reconstruction technique. COMPARISON:  Foot x-ray 06/18/2022.  Abdominopelvic CT 06/18/2022 FINDINGS: RIGHT FEMUR: Bones/Joint/Cartilage No acute fracture or dislocation. Right hip joint is intact with mild joint space narrowing. No appreciable right hip joint effusion. No intra-articular gas in the right hip joint. No erosion or periosteal elevation. Knee joint alignment is maintained. Small-moderate size knee joint effusion, nonspecific. Ligaments Suboptimally assessed by CT. Muscles and Tendons Numerous foci of gas within the anterior compartment of the right thigh. Gas is predominantly located along the fascial planes where there is extensive fat stranding and trace fluid. There are also foci of gas located  intramuscularly, notably involving the right sartorius and right iliopsoas muscles. Gas present within the sartorius muscle at the level of the distal femoral metaphysis (series 4, image 222). No well-defined intramuscular fluid collection. Grossly intact tendinous structures. Soft tissues Circumferential edema and ill-defined fluid throughout the right thigh. Soft tissue gas within the subcutaneous soft tissues of the right inguinal canal and anteromedial aspect of the proximal thigh. Enlarged right inguinal and right external iliac lymph nodes are likely reactive. Partially imaged complex collection in the pelvis along the right pelvic sidewall, better characterized by same day abdominopelvic CT. RIGHT TIBIA/FIBULA: Bones/Joint/Cartilage Tibia and fibula are intact without fracture or dislocation. No erosion or periosteal elevation. No tibiotalar joint effusion. Ligaments Suboptimally assessed by CT. Muscles and Tendons No intramuscular fluid collection within the lower leg musculature. No fascial or intramuscular gas. Soft tissues Circumferential subcutaneous edema and ill-defined fluid within the soft tissues of the lower leg. No organized fluid collection. No soft tissue gas. RIGHT FOOT: Bones/Joint/Cartilage Chronic-appearing bony changes of the midfoot which may be posttraumatic, neuropathic, or secondary to septic arthritis  with prominent sclerosis and subchondral cysts or erosions and areas of bony fragmentation. Bulky callus formation of the second through fourth metatarsals. No findings of an acute fracture or dislocation. Detailed evaluation is limited on large field-of-view whole lower extremity images. Ligaments Suboptimally assessed by CT. Muscles and Tendons Fatty atrophy of the foot musculature, likely related to denervation. No intramuscular gas. Soft tissues Generalized soft tissue swelling of the foot with fluid slightly more confluent along the medial aspect of the midfoot measuring  approximately 3.3 x 1.3 cm (series 4, image 452). No discernible soft tissue wound or ulceration. No soft tissue gas within the foot. IMPRESSION: 1. Numerous foci of gas within the anterior compartment of the right thigh. Gas is predominantly located along the fascial planes where there is extensive fat stranding and trace fluid. There are also foci of gas located within the right sartorius muscle and right iliopsoas musculature. Intramuscular gas within the sartorius muscle is seen tracking inferiorly to the level of the distal femoral metaphysis. Findings are consistent with necrotizing fasciitis and infectious myositis. 2. Findings of diffuse cellulitis of the right lower extremity extending from the proximal thigh to the foot. Gas within the superficial soft tissues is localized to the right inguinal region and anteromedial aspect of the proximal thigh. 3. Chronic-appearing bony changes of the midfoot which may be posttraumatic, neuropathic, and/or secondary to septic arthritis. 4. Partially imaged complex collection in the pelvis along the right pelvic sidewall, better characterized by same day abdominopelvic CT. 5. Small-moderate size knee joint effusion, nonspecific. 6. Enlarged right inguinal and right external iliac lymph nodes are likely reactive. Electronically Signed   By: Davina Poke D.O.   On: 06/18/2022 12:54   CT Foot Right Wo Contrast  Result Date: 06/18/2022 CLINICAL DATA:  Necrotizing fasciitis EXAM: CT OF THE LOWER RIGHT EXTREMITY WITHOUT CONTRAST TECHNIQUE: Multidetector CT imaging of the right lower extremity was performed according to the standard protocol. Images obtained of the right femur, right tibia-fibula, and right foot. RADIATION DOSE REDUCTION: This exam was performed according to the departmental dose-optimization program which includes automated exposure control, adjustment of the mA and/or kV according to patient size and/or use of iterative reconstruction technique.  COMPARISON:  Foot x-ray 06/18/2022.  Abdominopelvic CT 06/18/2022 FINDINGS: RIGHT FEMUR: Bones/Joint/Cartilage No acute fracture or dislocation. Right hip joint is intact with mild joint space narrowing. No appreciable right hip joint effusion. No intra-articular gas in the right hip joint. No erosion or periosteal elevation. Knee joint alignment is maintained. Small-moderate size knee joint effusion, nonspecific. Ligaments Suboptimally assessed by CT. Muscles and Tendons Numerous foci of gas within the anterior compartment of the right thigh. Gas is predominantly located along the fascial planes where there is extensive fat stranding and trace fluid. There are also foci of gas located intramuscularly, notably involving the right sartorius and right iliopsoas muscles. Gas present within the sartorius muscle at the level of the distal femoral metaphysis (series 4, image 222). No well-defined intramuscular fluid collection. Grossly intact tendinous structures. Soft tissues Circumferential edema and ill-defined fluid throughout the right thigh. Soft tissue gas within the subcutaneous soft tissues of the right inguinal canal and anteromedial aspect of the proximal thigh. Enlarged right inguinal and right external iliac lymph nodes are likely reactive. Partially imaged complex collection in the pelvis along the right pelvic sidewall, better characterized by same day abdominopelvic CT. RIGHT TIBIA/FIBULA: Bones/Joint/Cartilage Tibia and fibula are intact without fracture or dislocation. No erosion or periosteal elevation. No tibiotalar joint effusion.  Ligaments Suboptimally assessed by CT. Muscles and Tendons No intramuscular fluid collection within the lower leg musculature. No fascial or intramuscular gas. Soft tissues Circumferential subcutaneous edema and ill-defined fluid within the soft tissues of the lower leg. No organized fluid collection. No soft tissue gas. RIGHT FOOT: Bones/Joint/Cartilage Chronic-appearing  bony changes of the midfoot which may be posttraumatic, neuropathic, or secondary to septic arthritis with prominent sclerosis and subchondral cysts or erosions and areas of bony fragmentation. Bulky callus formation of the second through fourth metatarsals. No findings of an acute fracture or dislocation. Detailed evaluation is limited on large field-of-view whole lower extremity images. Ligaments Suboptimally assessed by CT. Muscles and Tendons Fatty atrophy of the foot musculature, likely related to denervation. No intramuscular gas. Soft tissues Generalized soft tissue swelling of the foot with fluid slightly more confluent along the medial aspect of the midfoot measuring approximately 3.3 x 1.3 cm (series 4, image 452). No discernible soft tissue wound or ulceration. No soft tissue gas within the foot. IMPRESSION: 1. Numerous foci of gas within the anterior compartment of the right thigh. Gas is predominantly located along the fascial planes where there is extensive fat stranding and trace fluid. There are also foci of gas located within the right sartorius muscle and right iliopsoas musculature. Intramuscular gas within the sartorius muscle is seen tracking inferiorly to the level of the distal femoral metaphysis. Findings are consistent with necrotizing fasciitis and infectious myositis. 2. Findings of diffuse cellulitis of the right lower extremity extending from the proximal thigh to the foot. Gas within the superficial soft tissues is localized to the right inguinal region and anteromedial aspect of the proximal thigh. 3. Chronic-appearing bony changes of the midfoot which may be posttraumatic, neuropathic, and/or secondary to septic arthritis. 4. Partially imaged complex collection in the pelvis along the right pelvic sidewall, better characterized by same day abdominopelvic CT. 5. Small-moderate size knee joint effusion, nonspecific. 6. Enlarged right inguinal and right external iliac lymph nodes are  likely reactive. Electronically Signed   By: Davina Poke D.O.   On: 06/18/2022 12:54   CT ABDOMEN PELVIS WO CONTRAST  Result Date: 06/18/2022 CLINICAL DATA:  41 year old female with history of right lower quadrant abdominal pain. Low hemoglobin. EXAM: CT ABDOMEN AND PELVIS WITHOUT CONTRAST TECHNIQUE: Multidetector CT imaging of the abdomen and pelvis was performed following the standard protocol without IV contrast. RADIATION DOSE REDUCTION: This exam was performed according to the departmental dose-optimization program which includes automated exposure control, adjustment of the mA and/or kV according to patient size and/or use of iterative reconstruction technique. COMPARISON:  CT the abdomen and pelvis 08/27/2021. FINDINGS: Lower chest: Small amount of pericardial fluid and/or thickening, unlikely to be of hemodynamic significance at this time. Hepatobiliary: No suspicious cystic or solid hepatic lesions are confidently identified on today's noncontrast CT examination. Diffuse low attenuation throughout the hepatic parenchyma, indicative of a background of hepatic steatosis. Unenhanced appearance of the gallbladder is unremarkable. Pancreas: No definite pancreatic mass or peripancreatic fluid collections or inflammatory changes are noted on today's noncontrast CT examination. Spleen: Unremarkable. Adrenals/Urinary Tract: Mild right hydroureteronephrosis. Unenhanced appearance of the kidneys is otherwise unremarkable bilaterally. Right adrenal gland is normal in appearance. 2.8 x 2.1 cm low-attenuation (-7 HU) left adrenal nodule, compatible with a benign adenoma. No left hydroureteronephrosis. Urinary bladder is unremarkable in appearance. 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 No pathologic dilatation of small bowel or colon. Vascular/Lymphatic: No atherosclerotic calcifications are noted in the abdominal aorta. Mild  atherosclerotic calcifications are noted in the  pelvic vasculature. No definite lymphadenopathy confidently identified in the abdomen or pelvis. Reproductive: Uterus and ovaries are unremarkable in appearance. Other: Mild diffuse body wall edema. Presacral edema. Trace volume of ascites. No pneumoperitoneum. Musculoskeletal: Along the right pelvic sidewall (axial image 70 of series 3 and coronal image 72 of series 4) there is a mass-like area measuring 6.8 x 6.0 x 7.8 cm which is internally low-attenuation, apparently centered in the right psoas musculature, with extension into the adjacent soft tissues, exerting substantial mass effect, particularly on the distal third of the right ureter. Gas is present in the deep soft tissues of the anterior compartment of the right thigh, tracking cephalad along the right iliacus musculature. There are no aggressive appearing lytic or blastic lesions noted in the visualized portions of the skeleton. IMPRESSION: 1. Findings, as above, highly concerning for necrotizing fasciitis in the right thigh. Further evaluation with noncontrast CT of the right lower extremity should be considered to better evaluate the full extent of these findings. 2. Low-attenuation mass-like lesion along the right pelvic sidewall centered in the inferior aspect of the right psoas muscle, likely a large abscess. This exerts mass effect upon adjacent structures, most notably the distal third of the right ureter, associated with mild proximal right hydroureteronephrosis. 3. No definite spontaneous intraperitoneal or retroperitoneal hemorrhage confidently identified on today's examination to account for the patient's low hematocrit level. 4. Trace volume of ascites. 5. Diffuse body wall edema. 6. Small amount of pericardial fluid and/or thickening, unlikely to be of hemodynamic significance at this time. 7. Hepatic steatosis. 8. Left adrenal adenoma. 9. Additional incidental findings, as above. Critical Value/emergent results were called by telephone at  the time of interpretation on 06/18/2022 at 9:58 am to provider [facility] , who verbally acknowledged these results. Electronically Signed   By: Vinnie Langton M.D.   On: 06/18/2022 10:02   DG Foot Complete Right  Result Date: 06/18/2022 CLINICAL DATA:  Foot swelling EXAM: RIGHT FOOT COMPLETE - 3 VIEW COMPARISON:  03/13/2022. FINDINGS: Disordered appearance of midfoot intertarsal configuration and bony fragmentation suggest Charcot joint changes. No definite acute traumatic abnormality identified. No osteolytic or osteoblastic changes. There is soft tissue swelling of the mid to distal foot. Plantar calcaneal spur noted. A 9 mm radiopaque needle shaped structure identified in the plantar soft tissues which could be a foreign body. IMPRESSION: Midfoot Charcot joint changes. Persistent foreign body in the plantar soft tissues. Electronically Signed   By: Sammie Bench M.D.   On: 06/18/2022 09:29   DG Chest 2 View  Result Date: 06/18/2022 CLINICAL DATA:  fever EXAM: CHEST - 2 VIEW COMPARISON:  03/05/2022 FINDINGS: Cardiac silhouette enlarged. No evidence of pneumothorax or pleural effusion. No evidence of pulmonary edema. There are thoracic degenerative changes. IMPRESSION: Enlarged cardiac silhouette.  Lungs are clear. Electronically Signed   By: Sammie Bench M.D.   On: 06/18/2022 09:19     TODAY-DAY OF DISCHARGE:  Subjective:   Jacqueline [name] today has no headache,no chest abdominal pain,no new weakness tingling or numbness, feels much better wants to go home today.   Objective:   Blood pressure (!) 145/79, pulse 84, temperature 98.4 F (36.9 C), temperature source Oral, resp. rate 13, height 5\' 5"  (1.651 m), weight 91.5 kg, last menstrual period 06/15/2022, SpO2 97 %.  Intake/Output Summary (Last 24 hours) at 06/29/2022 1407 Last data filed at 06/28/2022 2100 Gross per 24 hour  Intake 240 ml  Output --  Net 240 ml   Filed Weights   06/18/22 1343 06/18/22 1730  Weight: 82.6  kg 91.5 kg    Exam: Awake Alert, Oriented *3, No new F.N deficits, Normal affect [place].AT,PERRAL Supple Neck,No JVD, No cervical lymphadenopathy appriciated.  Symmetrical Chest wall movement, Good air movement bilaterally, CTAB RRR,No Gallops,Rubs or new Murmurs, No Parasternal Heave +ve B.Sounds, Abd Soft, Non tender, No organomegaly appriciated, No rebound -guarding or rigidity. No Cyanosis, Clubbing or edema, No new Rash or bruise   PERTINENT RADIOLOGIC STUDIES: No results found.   PERTINENT LAB RESULTS: CBC: Recent Labs    06/28/22 0324 06/29/22 0527  WBC 20.6* 20.2*  HGB 10.0* 9.5*  HCT 29.9* 28.3*  PLT 146* 161   CMET CMP     Component Value Date/Time   NA 145 06/29/2022 0527   K 4.1 06/29/2022 0527   CL 115 (H) 06/29/2022 0527   CO2 22 06/29/2022 0527   GLUCOSE 105 (H) 06/29/2022 0527   BUN 64 (H) 06/29/2022 0527   CREATININE 4.99 (H) 06/29/2022 0527   CALCIUM 7.7 (L) 06/29/2022 0527   PROT 5.7 (L) 06/19/2022 0056   ALBUMIN 1.7 (L) 06/29/2022 0527   AST 39 06/19/2022 0056   ALT 24 06/19/2022 0056   ALKPHOS 65 06/19/2022 0056   BILITOT 1.6 (H) 06/19/2022 0056   GFRNONAA 10 (L) 06/29/2022 0527   GFRAA 31 (L) 12/03/2019 0345    GFR Estimated Creatinine Clearance: 16.4 mL/min (A) (by C-G formula based on SCr of 4.99 mg/dL (H)). No results for input(s): "LIPASE", "AMYLASE" in the last 72 hours. No results for input(s): "CKTOTAL", "CKMB", "CKMBINDEX", "TROPONINI" in the last 72 hours. Invalid input(s): "POCBNP" No results for input(s): "DDIMER" in the last 72 hours. No results for input(s): "HGBA1C" in the last 72 hours. No results for input(s): "CHOL", "HDL", "LDLCALC", "TRIG", "CHOLHDL", "LDLDIRECT" in the last 72 hours. No results for input(s): "TSH", "T4TOTAL", "T3FREE", "THYROIDAB" in the last 72 hours.  Invalid input(s): "FREET3" No results for input(s): "VITAMINB12", "FOLATE", "FERRITIN", "TIBC", "IRON", "RETICCTPCT" in the last 72 hours. Coags: No  results for input(s): "INR" in the last 72 hours.  Invalid input(s): "PT" Microbiology: No results found for this or any previous visit (from the past 240 hour(s)).  FURTHER DISCHARGE INSTRUCTIONS:  Get Medicines reviewed and adjusted: Please take all your medications with you for your next visit with your Primary MD  Laboratory/radiological data: Please request your Primary MD to go over all hospital tests and procedure/radiological results at the follow up, please ask your Primary MD to get all Hospital records sent to his/her office.  In some cases, they will be blood work, cultures and biopsy results pending at the time of your discharge. Please request that your primary care M.D. goes through all the records of your hospital data and follows up on these results.  Also Note the following: If you experience worsening of your admission symptoms, develop shortness of breath, life threatening emergency, suicidal or homicidal thoughts you must seek medical attention immediately by calling 911 or calling your MD immediately  if symptoms less severe.  You must read complete instructions/literature along with all the possible adverse reactions/side effects for all the Medicines you take and that have been prescribed to you. Take any new Medicines after you have completely understood and accpet all the possible adverse reactions/side effects.   Do not drive when taking Pain medications or sleeping medications (Benzodaizepines)  Do not take more than prescribed Pain, Sleep and Anxiety Medications. It  is not advisable to combine anxiety,sleep and pain medications without talking with your primary care practitioner  Special Instructions: If you have smoked or chewed Tobacco  in the last 2 yrs please stop smoking, stop any regular Alcohol  and or any Recreational drug use.  Wear Seat belts while driving.  Please note: You were cared for by a hospitalist during your hospital stay. Once you are  discharged, your primary care physician will handle any further medical issues. Please note that NO REFILLS for any discharge medications will be authorized once you are discharged, as it is imperative that you return to your primary care physician (or establish a relationship with a primary care physician if you do not have one) for your post hospital discharge needs so that they can reassess your need for medications and monitor your lab values.  Total Time spent coordinating discharge including counseling, education and face to face time equals greater than 30 minutes.  SignedOren Binet 06/29/2022 2:07 PM

## 2022-06-29 NOTE — TOC Transition Note (Signed)
Transition of Care [facility]) - CM/SW Discharge Note   Patient Details  Name: Jacqueline [name] MRN: [id number] Date of Birth: 1981-06-04  [facility] CM/SW Contact:  Cyndi Bender, RN Phone Number: 06/29/2022, 2:01 PM   Clinical Narrative:     Patient stable for admit to CIR. TOC will sign off. Final next level of care: IP Rehab Facility Barriers to Discharge: Barriers Resolved   Patient Goals and CMS Choice Patient states their goals for this hospitalization and ongoing recovery are:: CIR CMS Medicare.gov Compare Post Acute Care list provided to:: Patient Choice offered to / list presented to : Patient  Discharge Placement                       Discharge Plan and Services   Discharge Planning Services: CM Consult Post Acute Care Choice: Home Health                    HH Arranged: RN, PT, OT [facility] Agency: [place] Date [facility] Agency Contacted: 06/22/22 Time HH Agency Contacted: 37 Representative spoke with at [place]: Cyndi  Social Determinants of Health ([place]) Interventions     Readmission Risk Interventions     No data to display

## 2022-06-29 NOTE — PMR Pre-admission (Signed)
PMR Admission Coordinator Pre-Admission Assessment  Patient: Jacqueline [name] is an 41 y.o., female MRN: [id number] DOB: [birth year] Height: _0  (165.1 cm) Weight: 91.5 kg  Insurance Information HMO:     PPO: yes     PCP:      IPA:      80/20:      OTHER:  PRIMARY: Rickey Primus      Policy#: [id number]      Subscriber: pt CM Name: Anderson Malta      Phone#: [phone]     Fax#: [phone] Pre-Cert#: [id number] approved for 7 days      Employer: [employer] Benefits:  Phone #: [phone]     Name: 12/12 Eff. Date: 07/18/21     Deduct: $500      Out of Pocket Max: $2000 CIR: 80%      SNF: 80% 100 days Outpatient: 80%     Co-Pay: 90 visits combined Home Health: 80%      Co-Pay: 120 visits per year DME: 80%     Co-Pay: 20% Providers: in network  SECONDARY: [place] Medicaid Healthy Blue      Policy#: [id number] received fax 12/12 that Auth not needed with Mikael Spray, BCBS being primary  Financial Counselor:       Phone#:   The Therapist, art Information Summary" for patients in Inpatient Rehabilitation Facilities with attached "Privacy Act [place] Records" was provided and verbally reviewed with: N/A  Emergency Contact Information Contact Information     Name Relation Home Work Mobile   [place] Spouse [phone]  [phone]       Current Medical History  Patient Admitting Diagnosis: Debility  History of Present Illness: 41 year old right-handed female with history of hypertension, progressive CKD followed by [place] [facility], morbid obesity with gastric bypass 2020, type 2 diabetes mellitus.    Presented 06/18/2022 with right groin and leg pain x 2 days.    Patient developed low-grade fever as well as bouts of nausea vomiting.  Underwent CT abdomen pelvis with contrast concerning for necrotizing fasciitis in the right thigh mass like area measuring 6.8 x 6.0 x 7.8 cm along the right pelvic sidewall centered in the right psoas musculature with extension into the  adjacent soft tissues, exerting substantial mass effect, particularly on the distal third of the right ureter with gas present in the deep soft tissue of the anterior compartment of the right thigh along the right iliac [place] musculature.  Noted WBC 27,800 lactic acid 1.9 hemoglobin 5.9 creatinine 5.57.  Patient underwent right thigh irrigation debridement as well as right pelvic irrigation debridement per Dr. Eustace Pen. Kinsinger12/08/2021.  She did return back to the OR [id number] for necrotizing infection undergoing wound exploration of right thigh and right groin with irrigation debridement and dressing change.  Patient maintained on broad-spectrum antibiotics.  She was extubated postop and taken to the PACU.  Follow-up for nephrology services for AKI on CKD stage IV likely due to ischemic ATN in the setting of sepsis with latest creatinine 4.99.  Acute blood loss anemia 9.5 and monitored.  She was cleared to begin subcutaneous heparin for DVT prophylaxis.  Antibiotic regimen transition to p.o. Augmentin which has been completed and currently receiving intravenous Diflucan x 7 doses for oral candidiasis.  Therapy evaluations completed due to patient decreased functional mobility was admitted for a comprehensive rehab program.   Patient's medical record [facility] has been reviewed by the rehabilitation admission coordinator and physician.  Past Medical History  Past Medical History:  Diagnosis Date   Anemia    Anxiety    Chronic bronchitis ([place])    Hypertension    Increased frequency of headaches    Morbid obesity ([place])    Sleep apnea    Type II diabetes mellitus ([place])     Has the patient had major surgery during 100 days prior to admission? Yes  Family History   family history includes Diabetes in her maternal grandmother and mother; Heart attack in an other family member; Hypertension in her mother; Kidney disease in her maternal grandmother; Thyroid disease in her  mother.  Current Medications  Current Facility-Administered Medications:    (feeding supplement) PROSource Plus liquid 30 mL, 30 mL, Oral, BID BM, Ghimire, Shanker M, MD, 30 mL at 06/29/22 1343   acetaminophen (TYLENOL) tablet 325 mg, 325 mg, Oral, Q6H, Saverio Danker, PA-C, 325 mg at 06/29/22 1344   amLODipine (NORVASC) tablet 10 mg, 10 mg, Oral, Daily, Ghimire, Shanker M, MD, 10 mg at 06/29/22 1004   calcitRIOL (ROCALTROL) capsule 0.25 mcg, 0.25 mcg, Oral, Daily, Corliss Parish, MD, 0.25 mcg at 06/29/22 1004   Chlorhexidine Gluconate Cloth 2 % PADS 6 each, 6 each, Topical, Daily, Autumn Messing III, MD, 6 each at 06/29/22 1005   Darbepoetin Alfa (ARANESP) injection 100 mcg, 100 mcg, Subcutaneous, Q Mon-1800, Corliss Parish, MD, 100 mcg at 06/27/22 2158   feeding supplement (ENSURE ENLIVE / ENSURE PLUS) liquid 237 mL, 237 mL, Oral, BID BM, Ghimire, Shanker M, MD, 237 mL at 06/29/22 1342   fluconazole (DIFLUCAN) IVPB 100 mg, 100 mg, Intravenous, Q24H, Ghimire, Shanker M, MD, Last Rate: 50 mL/hr at 06/29/22 1342, 100 mg at 06/29/22 1342   gabapentin (NEURONTIN) capsule 100 mg, 100 mg, Oral, TID, Simaan, Elizabeth S, PA-C, 100 mg at 06/29/22 1005   heparin injection 5,000 Units, 5,000 Units, Subcutaneous, Q8H, Autumn Messing III, MD, 5,000 Units at 06/29/22 1344   HYDROcodone-acetaminophen (NORCO/VICODIN) 5-325 MG per tablet 1-2 tablet, 1-2 tablet, Oral, Q4H PRN, Saverio Danker, PA-C, 2 tablet at 06/29/22 1003   HYDROmorphone (DILAUDID) injection 0.5-1 mg, 0.5-1 mg, Intravenous, Q5H PRN, Saverio Danker, PA-C, 1 mg at 06/29/22 1208   insulin aspart (novoLOG) injection 0-5 Units, 0-5 Units, Subcutaneous, QHS, Autumn Messing III, MD, 2 Units at 06/19/22 2132   nystatin (MYCOSTATIN/NYSTOP) topical powder, , Topical, TID, Ghimire, Henreitta Leber, MD, Given at 06/29/22 1005   ondansetron (ZOFRAN) injection 4 mg, 4 mg, Intravenous, Q6H PRN, Jonetta Osgood, MD, 4 mg at 06/28/22 2318   pantoprazole  (PROTONIX) EC tablet 40 mg, 40 mg, Oral, QHS, Ghimire, Shanker M, MD, 40 mg at 06/28/22 2023   phenol (CHLORASEPTIC) mouth spray 1 spray, 1 spray, Mouth/Throat, PRN, [place]y, Silver Huguenin, MD   sertraline (ZOLOFT) tablet 100 mg, 100 mg, Oral, QHS, Autumn Messing III, MD, 100 mg at 06/28/22 2023   sodium bicarbonate tablet 1,300 mg, 1,300 mg, Oral, BID, Corliss Parish, MD, 1,300 mg at 06/29/22 1004  Patients Current Diet:  Diet Order             Diet - low sodium heart healthy           Diet Carb Modified           Diet heart healthy/carb modified Room service appropriate? Yes; Fluid consistency: Thin; Fluid restriction: 1500 mL Fluid  Diet effective now                   Precautions / Restrictions Precautions Precautions: Fall Precaution Comments: R knee  buckle, watch BP Restrictions Weight Bearing Restrictions: No RLE Weight Bearing: Weight bearing as tolerated   Has the patient had 2 or more falls or a fall with injury in the past year? No  Prior Activity Level Community (5-7x/wk): independent  Prior Functional Level Self Care: Did the patient need help bathing, dressing, using the toilet or eating? Independent  Indoor Mobility: Did the patient need assistance with walking from room to room (with or without device)? Independent  Stairs: Did the patient need assistance with internal or external stairs (with or without device)? Independent  Functional Cognition: Did the patient need help planning regular tasks such as shopping or remembering to take medications? Independent  Patient Information Are you of Hispanic, Latino/a,or Spanish origin?: A. No, not of Hispanic, Latino/a, or Spanish origin What is your race?: B. Black or African American Do you need or want an interpreter to communicate with a doctor or health care staff?: 0. No  Patient's Response To:  Health Literacy and Transportation Is the patient able to respond to health literacy and transportation  needs?: Yes Health Literacy - How often do you need to have someone help you when you read instructions, pamphlets, or other written material from your doctor or pharmacy?: Never In the past 12 months, has lack of transportation kept you from medical appointments or from getting medications?: No In the past 12 months, has lack of transportation kept you from meetings, work, or from getting things needed for daily living?: No  Development worker, international aid / [place] Devices/Equipment: None Home Equipment: None  Prior Device Use: Indicate devices/aids used by the patient prior to current illness, exacerbation or injury? None of the above  Current Functional Level Cognition  Overall Cognitive Status: Within Functional Limits for tasks assessed Orientation Level: Oriented X4    Extremity Assessment (includes Sensation/Coordination)  Upper Extremity Assessment: Overall WFL for tasks assessed  Lower Extremity Assessment: RLE deficits/detail RLE Deficits / Details: good quad activation and able to to initiate moving Rt LE to EOB with adductor acitvation, assist for hip flexion secondary to pain. 4+/5 for dorsi/plantar flexion RLE: Unable to fully assess due to pain RLE Sensation: decreased light touch (faint sensation around thigh)    ADLs  Overall ADL's : Needs assistance/impaired Grooming: Set up, Sitting Lower Body Dressing: Sit to/from stand, Moderate assistance Lower Body Dressing Details (indicate cue type and reason): requires assist with R sock, relies on BUE support in standing Toilet Transfer: Minimal assistance, Ambulation Toilet Transfer Details (indicate cue type and reason): simulated to recliner, using RW Functional mobility during ADLs: Minimal assistance, Rolling walker (2 wheels)    Mobility  Overal bed mobility: Needs Assistance Bed Mobility: Supine to Sit Supine to sit: HOB elevated, Min guard Sit to supine: Min assist General bed mobility comments: cues  to use bed rail, pt taking extra time and using UE's to assist Rt LE off bed. guarding for safety.    Transfers  Overall transfer level: Needs assistance Equipment used: Rolling walker (2 wheels) Transfers: Sit to/from Stand Sit to Stand: Min assist, +2 physical assistance, Max assist Bed to/from chair/wheelchair/BSC transfer type:: Step pivot Step pivot transfers: Min assist General transfer comment: Min assist for power up from EOB, pt able to initiate assist needed to steady with full rise to walker. pt required +2 Max assist from low toilet height.    Ambulation / Gait / Stairs / Wheelchair Mobility  Ambulation/Gait Ambulation/Gait assistance: Herbalist (Feet): 140 Feet Assistive device:  Rolling walker (2 wheels) Gait Pattern/deviations: Step-to pattern, Decreased stance time - right, Decreased stride length, Decreased weight shift to right, Knees buckling, Shuffle, Trunk flexed, Step-through pattern General Gait Details: pt leading with Lt LE to load Rt for advancement. pt required min assist to steady walker and cues to maintain safe position. pt ambualting with step to pattern and halfway transitioned to step through. Step length decreased but no buckling noted with step through gait pattern. Gait velocity: slow Gait velocity interpretation: <1.31 ft/sec, indicative of household ambulator Pre-gait activities: small side step to [facility]    Posture / Balance Dynamic Sitting Balance Sitting balance - Comments: pt able to shift to complete pericare on toilet Balance Overall balance assessment: Needs assistance Sitting-balance support: Feet supported, No upper extremity supported Sitting balance-Leahy Scale: Good Sitting balance - Comments: pt able to shift to complete pericare on toilet Standing balance support: Bilateral upper extremity supported, During functional activity Standing balance-Leahy Scale: Poor Standing balance comment: relies on BUE support    Special  needs/care consideration Large open surgical wound to right thigh and groin   Previous Home Environment  Living Arrangements: Spouse/significant other, Children  Lives With: Spouse, Family Available Help at Discharge: Family, Available 24 hours/day (Mom can provide 24/7 assist) Type of Home: House Home Layout: Two level, Able to live on main level with bedroom/bathroom, Bed/bath upstairs Alternate Level Stairs-Rails: Right Alternate Level Stairs-Number of Steps: flight Home Access: Level entry, Stairs to enter Entrance Stairs-Rails: None Entrance Stairs-Number of Steps: 1 stoop Bathroom Shower/Tub: Tub/shower unit, Multimedia programmer: Standard Bathroom Accessibility: Yes How Accessible: Accessible via walker Home Care Services: No Additional Comments: pt lives with husband and 2 children (33 y.o. and 102 y.o.) her mother will come assist her at home as needed.  Discharge Living Setting Plans for Discharge Living Setting: Patient's home, Lives with (comment) (spouse and children) Type of Home at Discharge: House Discharge Home Layout: Able to live on main level with bedroom/bathroom, Two level Alternate Level Stairs-Rails: Right Alternate Level Stairs-Number of Steps: flight Discharge Home Access: Level entry Discharge Bathroom Shower/Tub: Tub/shower unit, Walk-in shower Discharge Bathroom Toilet: Standard Discharge Bathroom Accessibility: Yes How Accessible: Accessible via walker Does the patient have any problems obtaining your medications?: No  Social/Family/Support Systems Patient Roles: Spouse, Parent Contact Information: spouse, [place] Anticipated Caregiver: Mom Anticipated Caregiver's Contact Information: see contacts Ability/Limitations of Caregiver: none Caregiver Availability: 24/7 Discharge Plan Discussed with Primary Caregiver: Yes Is Caregiver In Agreement with Plan?: Yes Does Caregiver/Family have Issues with Lodging/Transportation while Pt is in  Rehab?: No  Goals Patient/Family Goal for Rehab: Mod I to supervision with PT and OT Expected length of stay: ELOS 7 to 10 days Pt/Family Agrees to Admission and willing to participate: Yes Program Orientation Provided & Reviewed with Pt/Caregiver Including Roles  & Responsibilities: Yes  Decrease burden of Care through IP rehab admission: n/a  Possible need for SNF placement upon discharge: not anticipated  Patient Condition: I have reviewed medical records from [facility], spoken with CM, and patient and family member. I met with patient at the bedside for inpatient rehabilitation assessment.  Patient will benefit from ongoing PT and OT, can actively participate in 3 hours of therapy a day 5 days of the week, and can make measurable gains during the admission.  Patient will also benefit from the coordinated team approach during an Inpatient Acute Rehabilitation admission.  The patient will receive intensive therapy as well as Rehabilitation physician, nursing, social worker, and  care management interventions.  Due to bladder management, bowel management, safety, skin/wound care, disease management, medication administration, pain management, and patient education the patient requires 24 hour a day rehabilitation nursing.  The patient is currently min assist overall with mobility and basic ADLs.  Discharge setting and therapy post discharge at home with home health is anticipated.  Patient has agreed to participate in the Acute Inpatient Rehabilitation Program and will admit today.  Preadmission Screen Completed By:  Cleatrice Burke, 06/29/2022 2:10 PM ______________________________________________________________________   Discussed status with Dr. Ranell Patrick on 06/29/22 at 1417 and received approval for admission today.  Admission Coordinator:  Cleatrice Burke, RN, time 3606 Date 06/29/22   Assessment/Plan: Diagnosis: Debility Does the need for close, 24 hr/day Medical  supervision in concert with the patient's rehab needs make it unreasonable for this patient to be served in a less intensive setting? Yes Co-Morbidities requiring supervision/potential complications: necrotizing fasciitis, diabetes, sepsis, renal failure, history of morbid obesity Due to bladder management, bowel management, safety, skin/wound care, disease management, medication administration, pain management, and patient education, does the patient require 24 hr/day rehab nursing? Yes Does the patient require coordinated care of a physician, rehab nurse, PT, OT to address physical and functional deficits in the context of the above medical diagnosis(es)? Yes Addressing deficits in the following areas: balance, endurance, locomotion, strength, transferring, bowel/bladder control, bathing, dressing, feeding, grooming, toileting, and psychosocial support Can the patient actively participate in an intensive therapy program of at least 3 hrs of therapy 5 days a week? Yes The potential for patient to make measurable gains while on inpatient rehab is excellent Anticipated functional outcomes upon discharge from inpatient rehab: modified independent PT, modified independent OT, independent SLP Estimated rehab length of stay to reach the above functional goals is: 5-7 days Anticipated discharge destination: Home 10. Overall Rehab/Functional Prognosis: excellent   MD Signature: Leeroy Cha, MD

## 2022-06-29 NOTE — Progress Notes (Signed)
PROGRESS NOTE        PATIENT DETAILS Name: Jacqueline [name] Last Age: 41 y.o. Sex: female Date of Birth: 06-Jan-1981 Admit Date: 06/18/2022 Admitting Physician Arta Bruce Kinsinger, MD JGO:[id number], Larey Dresser, MD  Brief Summary: Patient is a 41 y.o.  female with history of CKD stage IV, DM-2, HTN, s/p gastric bypass 2020-presenting with severe sepsis due to necrotizing fasciitis of right thigh/groin-evaluated by orthopedics/general surgery-and underwent right thigh I&D/debridement-and was subsequently admitted by PCCM postoperatively.  She was stabilized and subsequently transferred to [facility] on 12/5.  Significant events: 12/2>> presenting with right thigh necrotizing fasciitis-s/p debridement by Ortho/general surgery-admit to ICU postoperatively. 12/4>> irrigation/debridement by general surgery 12/5>> transferred to [facility].  Significant studies: 12/2>> CT abdomen/pelvis: Findings concerning for necrotizing fasciitis in the right thigh.  Probable right psoas muscle abscess. 12/2>> CT of the right lower extremity: Necrotizing fasciitis/infectious myositis of right thigh.  Significant microbiology data: 12/2>> COVID/influenza PCR: Negative 12/2>> right thigh tissue: Streptococcus anginosus/mixed anaerobic flora. 12/2>> blood culture: No growth  Procedures: 12/2>> right thigh I&D by Ortho, advised pelvis I&D by general surgery 12/4>> right thigh/groin irrigation and debridement  Consults: Orthopedics General surgery Infectious disease Nephrology PCCM  Subjective: Continues to have some pain-although is taking more oral narcotics-continues to require some IV narcotics.  Some intermittent nausea and vomiting yesterday none today.  Objective: Vitals: Blood pressure (!) 160/88, pulse 94, temperature 98.4 F (36.9 C), temperature source Oral, resp. rate 19, height 5\' 5"  (1.651 m), weight 91.5 kg, last menstrual period 06/15/2022, SpO2 98 %.   Exam: Gen Exam:Alert  awake-not in any distress HEENT:atraumatic, normocephalic Chest: B/L clear to auscultation anteriorly CVS:S1S2 regular Abdomen:soft non tender, non distended Extremities:no edema Neurology: Non focal Skin: no rash  Pertinent Labs/Radiology:    Latest Ref Rng & Units 06/29/2022    5:27 AM 06/28/2022    3:24 AM 06/27/2022    3:07 AM  CBC  WBC 4.0 - 10.5 K/uL 20.2  20.6  22.0   Hemoglobin 12.0 - 15.0 g/dL 9.5  10.0  9.9   Hematocrit 36.0 - 46.0 % 28.3  29.9  29.6   Platelets 150 - 400 K/uL 161  146  115     Lab Results  Component Value Date   NA 145 06/29/2022   K 4.1 06/29/2022   CL 115 (H) 06/29/2022   CO2 22 06/29/2022      Assessment/Plan: Severe sepsis due to necrotizing fasciitis involving right thigh/pelvis Sepsis physiology has resolved-s/p I&D on 12/2 and 12/4 Leukocytosis slowly downtrending Per general surgery-surgical site looks clean without any evidence of infection  Evaluated by ID-has been transitioned to oral Augmentin. Oral narcotics escalated 12/11 with better response-but continues to require IV Dilaudid for pain control--especially for dressing changes.  AKI on CKD 4 AKI-likely due to ischemic ATN in the setting of sepsis physiology Renal function gradually improving Nephrology following.  Nausea/vomiting Likely due to postop issues/narcotics Having almost daily BMs-benign abdominal exam-doubt SBO Not felt to be uremia as BUN/creatinine improving. Supportive care  Hyperkalemia Resolved  Metabolic acidosis Due to CKD On oral bicarb supplementation  Oral candidiasis Continue Diflucan-significant improvement in oral/esophageal symptoms Follow.  Normocytic anemia Due to a combination of anemia due to CKD and acute illness. Darbepoetin/iron defer to nephrology service Follow CBC  HTN BP better controlled  Continue amlodipine 10 mg daily.   DM-2 (A1c 5.4  on 12/5) with hyperglycemia Not on any medications at home Monitor closely on  extra sensitive SSI  Recent Labs    06/28/22 1602 06/28/22 2201 06/29/22 0811  GLUCAP 141* 150* 100*     Hyponatremia Mild Follow-up for now  Depression Stable Zoloft  BMI: Estimated body mass index is 33.57 kg/m as calculated from the following:   Height as of this encounter: 5\' 5"  (1.651 m).   Weight as of this encounter: 91.5 kg.   Code status:   Code Status: Prior   DVT Prophylaxis: heparin injection 5,000 Units Start: 06/18/22 1715   Family Communication: Spouse at bedside.   Disposition Plan: Status is: Inpatient Remains inpatient appropriate because: Severity of illness   Planned Discharge Destination: Await CIR bed.  Medically approaching stability for discharge.   Diet: Diet Order             Diet heart healthy/carb modified Room service appropriate? Yes; Fluid consistency: Thin; Fluid restriction: 1500 mL Fluid  Diet effective now                     Antimicrobial agents: Anti-infectives (From admission, onward)    Start     Dose/Rate Route Frequency Ordered Stop   06/27/22 1230  fluconazole (DIFLUCAN) IVPB 100 mg        100 mg 50 mL/hr over 60 Minutes Intravenous Every 24 hours 06/27/22 1130 07/04/22 1229   06/22/22 2200  amoxicillin-clavulanate (AUGMENTIN) 500-125 MG per tablet 1 tablet        1 tablet Oral Daily at bedtime 06/22/22 1356 06/28/22 2024   06/22/22 0600  ceFAZolin (ANCEF) IVPB 2g/100 mL premix  Status:  Discontinued        2 g 200 mL/hr over 30 Minutes Intravenous On call to O.R. 06/21/22 1829 06/21/22 1830   06/21/22 1700  Ampicillin-Sulbactam (UNASYN) 3 g in sodium chloride 0.9 % 100 mL IVPB  Status:  Discontinued        3 g 200 mL/hr over 30 Minutes Intravenous Every 12 hours 06/21/22 1435 06/22/22 1356   06/18/22 1900  linezolid (ZYVOX) IVPB 600 mg  Status:  Discontinued        600 mg 300 mL/hr over 60 Minutes Intravenous Every 12 hours 06/18/22 1702 06/22/22 1134   06/18/22 1715  meropenem (MERREM) 1 g in sodium  chloride 0.9 % 100 mL IVPB  Status:  Discontinued        1 g 200 mL/hr over 30 Minutes Intravenous Every 12 hours 06/18/22 1709 06/21/22 1411   06/18/22 0745  piperacillin-tazobactam (ZOSYN) IVPB 3.375 g        3.375 g 100 mL/hr over 30 Minutes Intravenous  Once 06/18/22 0733 06/18/22 0835        MEDICATIONS: Scheduled Meds:  (feeding supplement) PROSource Plus  30 mL Oral BID BM   acetaminophen  325 mg Oral Q6H   amLODipine  10 mg Oral Daily   calcitRIOL  0.25 mcg Oral Daily   Chlorhexidine Gluconate Cloth  6 each Topical Daily   darbepoetin (ARANESP) injection - NON-DIALYSIS  100 mcg Subcutaneous Q Mon-1800   feeding supplement  237 mL Oral BID BM   gabapentin  100 mg Oral TID   heparin injection (subcutaneous)  5,000 Units Subcutaneous Q8H   insulin aspart  0-5 Units Subcutaneous QHS   nystatin   Topical TID   pantoprazole  40 mg Oral QHS   sertraline  100 mg Oral QHS   sodium bicarbonate  1,300  mg Oral BID   Continuous Infusions:  fluconazole (DIFLUCAN) IV 100 mg (06/28/22 1323)    PRN Meds:.HYDROcodone-acetaminophen, HYDROmorphone (DILAUDID) injection, ondansetron (ZOFRAN) IV, phenol   I have personally reviewed following labs and imaging studies  LABORATORY DATA: CBC: Recent Labs  Lab 06/25/22 0223 06/26/22 1150 06/27/22 0307 06/28/22 0324 06/29/22 0527  WBC 22.5* 24.5* 22.0* 20.6* 20.2*  NEUTROABS  --   --  17.6*  --   --   HGB 11.4* 10.8* 9.9* 10.0* 9.5*  HCT 33.1* 32.8* 29.6* 29.9* 28.3*  MCV 89.0 91.1 90.0 89.0 88.2  PLT 125* 110* 115* 146* 161     Basic Metabolic Panel: Recent Labs  Lab 06/25/22 0223 06/26/22 1150 06/27/22 0307 06/28/22 0324 06/29/22 0527  NA 143 144 144 146* 145  K 4.1 4.0 3.9 4.0 4.1  CL 114* 115* 113* 117* 115*  CO2 18* 18* 20* 20* 22  GLUCOSE 85 147* 168* 118* 105*  BUN 88* 81* 75* 70* 64*  CREATININE 6.50* 5.77* 5.50* 5.22* 4.99*  CALCIUM 7.5* 7.7* 7.6* 7.7* 7.7*  PHOS 5.9* 5.8* 5.6* 5.3* 5.3*      GFR: Estimated Creatinine Clearance: 16.4 mL/min (A) (by C-G formula based on SCr of 4.99 mg/dL (H)).  Liver Function Tests: Recent Labs  Lab 06/25/22 0223 06/26/22 1150 06/27/22 0307 06/28/22 0324 06/29/22 0527  ALBUMIN 1.7* 1.9* 1.8* 1.7* 1.7*    No results for input(s): "LIPASE", "AMYLASE" in the last 168 hours. No results for input(s): "AMMONIA" in the last 168 hours.  Coagulation Profile: No results for input(s): "INR", "PROTIME" in the last 168 hours.  Cardiac Enzymes: No results for input(s): "CKTOTAL", "CKMB", "CKMBINDEX", "TROPONINI" in the last 168 hours.  BNP (last 3 results) No results for input(s): "PROBNP" in the last 8760 hours.  Lipid Profile: No results for input(s): "CHOL", "HDL", "LDLCALC", "TRIG", "CHOLHDL", "LDLDIRECT" in the last 72 hours.  Thyroid Function Tests: No results for input(s): "TSH", "T4TOTAL", "FREET4", "T3FREE", "THYROIDAB" in the last 72 hours.  Anemia Panel: No results for input(s): "VITAMINB12", "FOLATE", "FERRITIN", "TIBC", "IRON", "RETICCTPCT" in the last 72 hours.  Urine analysis:    Component Value Date/Time   COLORURINE YELLOW 06/18/2022 0820   APPEARANCEUR HAZY (A) 06/18/2022 0820   LABSPEC 1.013 06/18/2022 0820   PHURINE 7.0 06/18/2022 0820   GLUCOSEU 50 (A) 06/18/2022 0820   HGBUR NEGATIVE 06/18/2022 0820   BILIRUBINUR NEGATIVE 06/18/2022 0820   KETONESUR NEGATIVE 06/18/2022 0820   PROTEINUR >=300 (A) 06/18/2022 0820   UROBILINOGEN 0.2 08/21/2012 2130   NITRITE NEGATIVE 06/18/2022 0820   LEUKOCYTESUR TRACE (A) 06/18/2022 0820    Sepsis Labs: Lactic Acid, Venous    Component Value Date/Time   LATICACIDVEN 2.4 (HH) 06/18/2022 1755    MICROBIOLOGY: No results found for this or any previous visit (from the past 240 hour(s)).   RADIOLOGY STUDIES/RESULTS: No results found.   LOS: 11 days   Oren Binet, MD  [facility]    To contact the attending provider between 7A-7P or the covering  provider during after hours 7P-7A, please log into the web site [email or web address] and access using universal [place] password for that web site. If you do not have the password, please call the hospital operator.  06/29/2022, 11:33 AM

## 2022-06-29 NOTE — Progress Notes (Signed)
Inpatient Rehabilitation Admission Medication Review by a Pharmacist  A complete drug regimen review was completed for this patient to identify any potential clinically significant medication issues.  High Risk Drug Classes Is patient taking? Indication by Medication  Antipsychotic No   Anticoagulant Yes Sq heparin - VTE ppx  Antibiotic No   Opioid Yes Vicodin prn pain  Antiplatelet No   Hypoglycemics/insulin Yes SSI- DM  Vasoactive Medication Yes Amlodipine - HTN  Chemotherapy No   Other Yes Aranesp - anemia   Calcitriol, sodium bicarb - HD, supplement  Sertraline - mood Gabapentin - pain Pantoprazole - Reflux Nystatin topical - infection  Fluconazole until 12/17 for thrush     Type of Medication Issue Identified Description of Issue Recommendation(s)  Drug Interaction(s) (clinically significant)     Duplicate Therapy     Allergy     No Medication Administration End Date     Incorrect Dose     Additional Drug Therapy Needed     Significant med changes from prior encounter (inform family/care partners about these prior to discharge).    Other       Clinically significant medication issues were identified that warrant physician communication and completion of prescribed/recommended actions by midnight of the next day:  No  Pharmacist comments: None  Time spent performing this drug regimen review (minutes):  30 minutes  Thank you Anette Guarneri, PharmD

## 2022-06-29 NOTE — Progress Notes (Signed)
[name], MD  Physician Physical Medicine and Rehabilitation   PMR Pre-admission    Signed   Date of Service: 06/29/2022  2:10 PM  Related encounter: ED to Hosp-Admission (Current) from 06/18/2022 in [facility]   Signed      Show:Clear all _0 Written_1 Templated_2 Copied  Added by: _3 Cristina Gong, RN_4 Ranell Patrick Clide Deutscher, MD  _5 Hover for details PMR Admission Coordinator Pre-Admission Assessment   Patient: Jacqueline [name] is an 41 y.o., female MRN: [id number] DOB: 28-Jun-1981 Height: _6  (165.1 cm) Weight: 91.5 kg   Insurance Information HMO:     PPO: yes     PCP:      IPA:      80/20:      OTHER:  PRIMARY: Rickey Primus      Policy#: [id number]      Subscriber: pt CM Name: Anderson Malta      Phone#: [phone]     Fax#: [phone] Pre-Cert#: [id number] approved for 7 days      Employer: [place] Benefits:  Phone #: [phone]     Name: 12/12 Eff. Date: 07/18/21     Deduct: $500      Out of Pocket Max: $2000 CIR: 80%      SNF: 80% 100 days Outpatient: 80%     Co-Pay: 90 visits combined Home Health: 80%      Co-Pay: 120 visits per year DME: 80%     Co-Pay: 20% Providers: in network  SECONDARY: [place] Medicaid Healthy Blue      Policy#: [id number] received fax 12/12 that Auth not needed with Mikael Spray, BCBS being primary   Financial Counselor:       Phone#:    The Therapist, art Information Summary" for patients in Inpatient Rehabilitation Facilities with attached "Privacy Act [place] Records" was provided and verbally reviewed with: N/A   Emergency Contact Information Contact Information       Name Relation Home Work Mobile    [place] Spouse [phone]   [phone]           Current Medical History  Patient Admitting Diagnosis: Debility   History of Present Illness: 41 year old right-handed female with history of hypertension, progressive CKD followed by [place] [facility], morbid obesity with  gastric bypass 2020, type 2 diabetes mellitus.    Presented 06/18/2022 with right groin and leg pain x 2 days.     Patient developed low-grade fever as well as bouts of nausea vomiting.  Underwent CT abdomen pelvis with contrast concerning for necrotizing fasciitis in the right thigh mass like area measuring 6.8 x 6.0 x 7.8 cm along the right pelvic sidewall centered in the right psoas musculature with extension into the adjacent soft tissues, exerting substantial mass effect, particularly on the distal third of the right ureter with gas present in the deep soft tissue of the anterior compartment of the right thigh along the right iliac [place] musculature.  Noted WBC 27,800 lactic acid 1.9 hemoglobin 5.9 creatinine 5.57.  Patient underwent right thigh irrigation debridement as well as right pelvic irrigation debridement per Dr. Eustace Pen. Kinsinger12/08/2021.  She did return back to the OR [id number] for necrotizing infection undergoing wound exploration of right thigh and right groin with irrigation debridement and dressing change.  Patient maintained on broad-spectrum antibiotics.  She was extubated postop and taken to the PACU.  Follow-up for nephrology services for AKI on CKD stage IV likely due to ischemic ATN in the setting of sepsis with latest creatinine 4.99.  Acute blood loss anemia 9.5 and monitored.  She was cleared to begin subcutaneous heparin for DVT prophylaxis.  Antibiotic regimen transition to p.o. Augmentin which has been completed and currently receiving intravenous Diflucan x 7 doses for oral candidiasis.  Therapy evaluations completed due to patient decreased functional mobility was admitted for a comprehensive rehab program.   Patient[facility] has been reviewed by the rehabilitation admission coordinator and physician.   Past Medical History      Past Medical History:  Diagnosis Date   Anemia     Anxiety     Chronic bronchitis (HCC)     Hypertension      Increased frequency of headaches     Morbid obesity ([place])     Sleep apnea     Type II diabetes mellitus ([place])        Has the patient had major surgery during 100 days prior to admission? Yes   Family History   family history includes Diabetes in her maternal grandmother and mother; Heart attack in an other family member; Hypertension in her mother; Kidney disease in her maternal grandmother; Thyroid disease in her mother.   Current Medications   Current Facility-Administered Medications:    (feeding supplement) PROSource Plus liquid 30 mL, 30 mL, Oral, BID BM, Ghimire, Shanker M, MD, 30 mL at 06/29/22 1343   acetaminophen (TYLENOL) tablet 325 mg, 325 mg, Oral, Q6H, Saverio Danker, PA-C, 325 mg at 06/29/22 1344   amLODipine (NORVASC) tablet 10 mg, 10 mg, Oral, Daily, Ghimire, Shanker M, MD, 10 mg at 06/29/22 1004   calcitRIOL (ROCALTROL) capsule 0.25 mcg, 0.25 mcg, Oral, Daily, Corliss Parish, MD, 0.25 mcg at 06/29/22 1004   Chlorhexidine Gluconate Cloth 2 % PADS 6 each, 6 each, Topical, Daily, Autumn Messing III, MD, 6 each at 06/29/22 1005   Darbepoetin Alfa (ARANESP) injection 100 mcg, 100 mcg, Subcutaneous, Q Mon-1800, Corliss Parish, MD, 100 mcg at 06/27/22 2158   feeding supplement (ENSURE ENLIVE / ENSURE PLUS) liquid 237 mL, 237 mL, Oral, BID BM, Ghimire, Shanker M, MD, 237 mL at 06/29/22 1342   fluconazole (DIFLUCAN) IVPB 100 mg, 100 mg, Intravenous, Q24H, Ghimire, Shanker M, MD, Last Rate: 50 mL/hr at 06/29/22 1342, 100 mg at 06/29/22 1342   gabapentin (NEURONTIN) capsule 100 mg, 100 mg, Oral, TID, Simaan, Elizabeth S, PA-C, 100 mg at 06/29/22 1005   heparin injection 5,000 Units, 5,000 Units, Subcutaneous, Q8H, Autumn Messing III, MD, 5,000 Units at 06/29/22 1344   HYDROcodone-acetaminophen (NORCO/VICODIN) 5-325 MG per tablet 1-2 tablet, 1-2 tablet, Oral, Q4H PRN, Saverio Danker, PA-C, 2 tablet at 06/29/22 1003   HYDROmorphone (DILAUDID) injection 0.5-1 mg, 0.5-1 mg,  Intravenous, Q5H PRN, Saverio Danker, PA-C, 1 mg at 06/29/22 1208   insulin aspart (novoLOG) injection 0-5 Units, 0-5 Units, Subcutaneous, QHS, Autumn Messing III, MD, 2 Units at 06/19/22 2132   nystatin (MYCOSTATIN/NYSTOP) topical powder, , Topical, TID, Ghimire, Henreitta Leber, MD, Given at 06/29/22 1005   ondansetron (ZOFRAN) injection 4 mg, 4 mg, Intravenous, Q6H PRN, Jonetta Osgood, MD, 4 mg at 06/28/22 2318   pantoprazole (PROTONIX) EC tablet 40 mg, 40 mg, Oral, QHS, Ghimire, Shanker M, MD, 40 mg at 06/28/22 2023   phenol (CHLORASEPTIC) mouth spray 1 spray, 1 spray, Mouth/Throat, PRN, [place], Silver Huguenin, MD   sertraline (ZOLOFT) tablet 100 mg, 100 mg, Oral, QHS, Autumn Messing III, MD, 100 mg at 06/28/22 2023   sodium bicarbonate tablet 1,300 mg, 1,300 mg, Oral, BID, Moshe Cipro,  Lambert Keto, MD, 1,300 mg at 06/29/22 1004   Patients Current Diet:  Diet Order                  Diet - low sodium heart healthy             Diet Carb Modified             Diet heart healthy/carb modified Room service appropriate? Yes; Fluid consistency: Thin; Fluid restriction: 1500 mL Fluid  Diet effective now                         Precautions / Restrictions Precautions Precautions: Fall Precaution Comments: R knee buckle, watch BP Restrictions Weight Bearing Restrictions: No RLE Weight Bearing: Weight bearing as tolerated    Has the patient had 2 or more falls or a fall with injury in the past year? No   Prior Activity Level Community (5-7x/wk): independent   Prior Functional Level Self Care: Did the patient need help bathing, dressing, using the toilet or eating? Independent   Indoor Mobility: Did the patient need assistance with walking from room to room (with or without device)? Independent   Stairs: Did the patient need assistance with internal or external stairs (with or without device)? Independent   Functional Cognition: Did the patient need help planning regular tasks such as shopping or  remembering to take medications? Independent   Patient Information Are you of Hispanic, Latino/a,or Spanish origin?: A. No, not of Hispanic, Latino/a, or Spanish origin What is your race?: B. Black or African American Do you need or want an interpreter to communicate with a doctor or health care staff?: 0. No   Patient's Response To:  Health Literacy and Transportation Is the patient able to respond to health literacy and transportation needs?: Yes Health Literacy - How often do you need to have someone help you when you read instructions, pamphlets, or other written material from your doctor or pharmacy?: Never In the past 12 months, has lack of transportation kept you from medical appointments or from getting medications?: No In the past 12 months, has lack of transportation kept you from meetings, work, or from getting things needed for daily living?: No   Development worker, international aid / [place] Devices/Equipment: None Home Equipment: None   Prior Device Use: Indicate devices/aids used by the patient prior to current illness, exacerbation or injury? None of the above   Current Functional Level Cognition   Overall Cognitive Status: Within Functional Limits for tasks assessed Orientation Level: Oriented X4    Extremity Assessment (includes Sensation/Coordination)   Upper Extremity Assessment: Overall WFL for tasks assessed  Lower Extremity Assessment: RLE deficits/detail RLE Deficits / Details: good quad activation and able to to initiate moving Rt LE to EOB with adductor acitvation, assist for hip flexion secondary to pain. 4+/5 for dorsi/plantar flexion RLE: Unable to fully assess due to pain RLE Sensation: decreased light touch (faint sensation around thigh)     ADLs   Overall ADL's : Needs assistance/impaired Grooming: Set up, Sitting Lower Body Dressing: Sit to/from stand, Moderate assistance Lower Body Dressing Details (indicate cue type and reason): requires  assist with R sock, relies on BUE support in standing Toilet Transfer: Minimal assistance, Ambulation Toilet Transfer Details (indicate cue type and reason): simulated to recliner, using RW Functional mobility during ADLs: Minimal assistance, Rolling walker (2 wheels)     Mobility   Overal bed mobility: Needs Assistance Bed Mobility: Supine to  Sit Supine to sit: HOB elevated, Min guard Sit to supine: Min assist General bed mobility comments: cues to use bed rail, pt taking extra time and using UE's to assist Rt LE off bed. guarding for safety.     Transfers   Overall transfer level: Needs assistance Equipment used: Rolling walker (2 wheels) Transfers: Sit to/from Stand Sit to Stand: Min assist, +2 physical assistance, Max assist Bed to/from chair/wheelchair/BSC transfer type:: Step pivot Step pivot transfers: Min assist General transfer comment: Min assist for power up from EOB, pt able to initiate assist needed to steady with full rise to walker. pt required +2 Max assist from low toilet height.     Ambulation / Gait / Stairs / Wheelchair Mobility   Ambulation/Gait Ambulation/Gait assistance: Herbalist (Feet): 140 Feet Assistive device: Rolling walker (2 wheels) Gait Pattern/deviations: Step-to pattern, Decreased stance time - right, Decreased stride length, Decreased weight shift to right, Knees buckling, Shuffle, Trunk flexed, Step-through pattern General Gait Details: pt leading with Lt LE to load Rt for advancement. pt required min assist to steady walker and cues to maintain safe position. pt ambualting with step to pattern and halfway transitioned to step through. Step length decreased but no buckling noted with step through gait pattern. Gait velocity: slow Gait velocity interpretation: <1.31 ft/sec, indicative of household ambulator Pre-gait activities: small side step to [facility]     Posture / Balance Dynamic Sitting Balance Sitting balance - Comments: pt able to  shift to complete pericare on toilet Balance Overall balance assessment: Needs assistance Sitting-balance support: Feet supported, No upper extremity supported Sitting balance-Leahy Scale: Good Sitting balance - Comments: pt able to shift to complete pericare on toilet Standing balance support: Bilateral upper extremity supported, During functional activity Standing balance-Leahy Scale: Poor Standing balance comment: relies on BUE support     Special needs/care consideration Large open surgical wound to right thigh and groin    Previous Home Environment  Living Arrangements: Spouse/significant other, Children  Lives With: Spouse, Family Available Help at Discharge: Family, Available 24 hours/day (Mom can provide 24/7 assist) Type of Home: House Home Layout: Two level, Able to live on main level with bedroom/bathroom, Bed/bath upstairs Alternate Level Stairs-Rails: Right Alternate Level Stairs-Number of Steps: flight Home Access: Level entry, Stairs to enter Entrance Stairs-Rails: None Entrance Stairs-Number of Steps: 1 stoop Bathroom Shower/Tub: Tub/shower unit, Multimedia programmer: Standard Bathroom Accessibility: Yes How Accessible: Accessible via walker Home Care Services: No Additional Comments: pt lives with husband and 2 children (58 y.o. and 78 y.o.) her mother will come assist her at home as needed.   Discharge Living Setting Plans for Discharge Living Setting: Patient's home, Lives with (comment) (spouse and children) Type of Home at Discharge: House Discharge Home Layout: Able to live on main level with bedroom/bathroom, Two level Alternate Level Stairs-Rails: Right Alternate Level Stairs-Number of Steps: flight Discharge Home Access: Level entry Discharge Bathroom Shower/Tub: Tub/shower unit, Walk-in shower Discharge Bathroom Toilet: Standard Discharge Bathroom Accessibility: Yes How Accessible: Accessible via walker Does the patient have any problems  obtaining your medications?: No   Social/Family/Support Systems Patient Roles: Spouse, Parent Contact Information: spouse, [place] Anticipated Caregiver: Mom Anticipated Caregiver's Contact Information: see contacts Ability/Limitations of Caregiver: none Caregiver Availability: 24/7 Discharge Plan Discussed with Primary Caregiver: Yes Is Caregiver In Agreement with Plan?: Yes Does Caregiver/Family have Issues with Lodging/Transportation while Pt is in Rehab?: No   Goals Patient/Family Goal for Rehab: Mod I to supervision with PT and OT  Expected length of stay: ELOS 7 to 10 days Pt/Family Agrees to Admission and willing to participate: Yes Program Orientation Provided & Reviewed with Pt/Caregiver Including Roles  & Responsibilities: Yes   Decrease burden of Care through IP rehab admission: n/a   Possible need for SNF placement upon discharge: not anticipated   Patient Condition: I have reviewed medical records from [facility], spoken with CM, and patient and family member. I met with patient at the bedside for inpatient rehabilitation assessment.  Patient will benefit from ongoing PT and OT, can actively participate in 3 hours of therapy a day 5 days of the week, and can make measurable gains during the admission.  Patient will also benefit from the coordinated team approach during an Inpatient Acute Rehabilitation admission.  The patient will receive intensive therapy as well as Rehabilitation physician, nursing, social worker, and care management interventions.  Due to bladder management, bowel management, safety, skin/wound care, disease management, medication administration, pain management, and patient education the patient requires 24 hour a day rehabilitation nursing.  The patient is currently min assist overall with mobility and basic ADLs.  Discharge setting and therapy post discharge at home with home health is anticipated.  Patient has agreed to participate in the Acute  Inpatient Rehabilitation Program and will admit today.   Preadmission Screen Completed By:  Cleatrice Burke, 06/29/2022 2:10 PM ______________________________________________________________________   Discussed status with Dr. Ranell Patrick on 06/29/22 at 1417 and received approval for admission today.   Admission Coordinator:  Cleatrice Burke, RN, time 9977 Date 06/29/22    Assessment/Plan: Diagnosis: Debility Does the need for close, 24 hr/day Medical supervision in concert with the patient's rehab needs make it unreasonable for this patient to be served in a less intensive setting? Yes Co-Morbidities requiring supervision/potential complications: necrotizing fasciitis, diabetes, sepsis, renal failure, history of morbid obesity Due to bladder management, bowel management, safety, skin/wound care, disease management, medication administration, pain management, and patient education, does the patient require 24 hr/day rehab nursing? Yes Does the patient require coordinated care of a physician, rehab nurse, PT, OT to address physical and functional deficits in the context of the above medical diagnosis(es)? Yes Addressing deficits in the following areas: balance, endurance, locomotion, strength, transferring, bowel/bladder control, bathing, dressing, feeding, grooming, toileting, and psychosocial support Can the patient actively participate in an intensive therapy program of at least 3 hrs of therapy 5 days a week? Yes The potential for patient to make measurable gains while on inpatient rehab is excellent Anticipated functional outcomes upon discharge from inpatient rehab: modified independent PT, modified independent OT, independent SLP Estimated rehab length of stay to reach the above functional goals is: 5-7 days Anticipated discharge destination: Home 10. Overall Rehab/Functional Prognosis: excellent     MD Signature: Leeroy Cha, MD         Revision History

## 2022-06-29 NOTE — Progress Notes (Signed)
  Inpatient Rehabilitation Admissions Coordinator   I have insurance approval and can admit to Cir today. Acute team and TOC made aware. I met at bedside and patient is in agreement. I will make the arrangements.  Danne Baxter, RN, MSN Rehab Admissions Coordinator [phone] 06/29/2022 2:06 PM

## 2022-06-29 NOTE — H&P (Signed)
Physical Medicine and Rehabilitation Admission H&P    CC: Debility  HPI: Jacqueline [name] is a 41 year old right-handed female with history of hypertension, progressive CKD followed by [facility], morbid obesity with gastric bypass 2020, type 2 diabetes mellitus.  Per chart review lives with spouse and children.  Independent prior to admission.  Presented 06/18/2022 with right groin and leg pain x 2 days.  Patient developed low-grade fever as well as bouts of nausea vomiting.  Underwent CT abdomen pelvis with contrast concerning for necrotizing fasciitis in the right thigh masslike area measuring 6.8 x 6.0 x 7.8 cm along the right pelvic sidewall centered in the right psoas musculature with extension into the adjacent soft tissues, exerting substantial mass effect, particularly on the distal third of the right ureter with gas present in the deep soft tissue of the anterior compartment of the right thigh along the right iliac [place] musculature.  Noted WBC 27,800 lactic acid 1.9 hemoglobin 5.9 creatinine 5.57.  Patient underwent right thigh irrigation debridement as well as right pelvic irrigation debridement per Dr. Eustace Pen. Kinsinger12/08/2021.  She did return back to the OR [id number] for necrotizing infection undergoing wound exploration of right thigh and right groin with irrigation debridement and dressing change.  Patient maintained on broad-spectrum antibiotics.  She was extubated postop and taken to the PACU.  Follow-up for nephrology services for AKI on CKD stage IV likely due to ischemic ATN in the setting of sepsis with latest creatinine 4.99.  Acute blood loss anemia 9.5 and monitored.  She was cleared to begin subcutaneous heparin for DVT prophylaxis.  Antibiotic regimen transition to p.o. Augmentin which has been completed and currently receiving intravenous Diflucan x 7 doses for oral candidiasis.  Therapy evaluations completed due to patient decreased functional mobility was admitted for a  comprehensive rehab program. She is having difficulty elevating her RLE.  Review of Systems  Constitutional:  Positive for fever and malaise/fatigue.  Gastrointestinal:  Positive for abdominal pain, nausea and vomiting.  Musculoskeletal:  Positive for joint pain and myalgias.       Groin pain  Psychiatric/Behavioral:  Positive for depression.        Anxiety  All other systems reviewed and are negative.  Past Medical History:  Diagnosis Date   Anemia    Anxiety    Chronic bronchitis (HCC)    Hypertension    Increased frequency of headaches    Morbid obesity ([place])    Sleep apnea    Type II diabetes mellitus [employer] [facility] [facility]    Past Surgical History:  Procedure Laterality Date   BARIATRIC SURGERY     BREAST REDUCTION SURGERY  03/21/2017   CARDIAC CATHETERIZATION  01/06/2016   CARDIAC CATHETERIZATION N/A 01/06/2016   Procedure: Left Heart Cath and Coronary Angiography;  Surgeon: Wellington Hampshire, MD;  Location: [facility];  Service: Cardiovascular;  Laterality: N/A;   CESAREAN SECTION  08/2014   I & D EXTREMITY Right 06/18/2022   Procedure: IRRIGATION AND DEBRIDEMENT RIGHT ABDOMEN AND THIGH;  Surgeon: Kinsinger, Arta Bruce, MD;  Location: [facility];  Service: General;  Laterality: Right;   I & D EXTREMITY Right 06/20/2022   Procedure: WOUND EXPLORATION OF RIGHT THIGH AND RIGHT GROIN WITH IRRIGATION AND DEBRIDEMENT;  Surgeon: Jovita Kussmaul, MD;  Location: [facility];  Service: General;  Laterality: Right;   REDUCTION MAMMAPLASTY Bilateral 03/21/2017   Family History  Problem Relation Age of Onset   Diabetes Mother    Hypertension Mother  Thyroid disease Mother    Kidney disease Maternal Grandmother    Diabetes Maternal Grandmother    Heart attack Other    Social History:  reports that she has never smoked. She has never used smokeless tobacco. She reports that she does not drink alcohol and does not use drugs. Allergies: No Known Allergies Medications Prior to Admission  Medication  Sig Dispense Refill   amLODipine (NORVASC) 10 MG tablet Take 1 tablet (10 mg total) by mouth daily. 30 tablet 3   bumetanide (BUMEX) 1 MG tablet Take 1 mg by mouth every morning.     calcitRIOL (ROCALTROL) 0.25 MCG capsule Take 0.25 mcg by mouth daily.     carvedilol (COREG) 25 MG tablet Take 1 tablet (25 mg total) by mouth 2 (two) times daily with a meal. 60 tablet 0   diclofenac Sodium (VOLTAREN) 1 % GEL Apply 2 g topically 4 (four) times daily as needed (pain).      folic acid (FOLVITE) 1 MG tablet Take 1 mg by mouth daily.     furosemide (LASIX) 40 MG tablet Take 40 mg by mouth daily as needed for fluid or edema.     hydrALAZINE (APRESOLINE) 10 MG tablet Take 10 mg by mouth daily.     methocarbamol (ROBAXIN) 500 MG tablet Take 500 mg by mouth every 6 (six) hours as needed for muscle spasms.     sertraline (ZOLOFT) 100 MG tablet Take 100 mg by mouth at bedtime.     Vitamin D, Ergocalciferol, (DRISDOL) 1.25 MG (50000 UNIT) CAPS capsule Take 50,000 Units by mouth every Thursday.     gentamicin cream (GARAMYCIN) 0.1 % Apply 1 Application topically 2 (two) times daily. (Patient not taking: Reported on 06/18/2022) 30 g 1    Home: Home Living Family/patient expects to be discharged to:: Private residence Living Arrangements: Spouse/significant other, Children Available Help at Discharge: Family Type of Home: House Home Access: Level entry, Stairs to enter [employer] of Steps: 1 stoop Entrance Stairs-Rails: None Home Layout: Two level, Able to live on main level with bedroom/bathroom, Bed/bath upstairs Alternate Level Stairs-Number of Steps: flight Alternate Level Stairs-Rails: Right Bathroom Shower/Tub: Tub/shower unit, Multimedia programmer: Standard Bathroom Accessibility: Yes Home Equipment: None Additional Comments: pt lives with husband and 2 children (25 y.o. and 21 y.o.) her mother will come assist her at home as needed.   Functional History: Prior  Function Prior Level of Function : Independent/Modified Independent, Working/employed, Driving   Functional Status:  Mobility: Bed Mobility Overal bed mobility: Needs Assistance Bed Mobility: Supine to Sit Supine to sit: HOB elevated, Min guard Sit to supine: Min assist General bed mobility comments: cues to use bed rail, pt taking extra time and using UE's to assist Rt LE off bed. guarding for safety. Transfers Overall transfer level: Needs assistance Equipment used: Rolling walker (2 wheels) Transfers: Sit to/from Stand Sit to Stand: Min assist, +2 physical assistance, Max assist Bed to/from chair/wheelchair/BSC transfer type:: Step pivot Step pivot transfers: Min assist General transfer comment: Min assist for power up from EOB, pt able to initiate assist needed to steady with full rise to walker. pt required +2 Max assist from low toilet height. Ambulation/Gait Ambulation/Gait assistance: Min assist Gait Distance (Feet): 140 Feet Assistive device: Rolling walker (2 wheels) Gait Pattern/deviations: Step-to pattern, Decreased stance time - right, Decreased stride length, Decreased weight shift to right, Knees buckling, Shuffle, Trunk flexed, Step-through pattern General Gait Details: pt leading with Lt LE to load Rt for  advancement. pt required min assist to steady walker and cues to maintain safe position. pt ambualting with step to pattern and halfway transitioned to step through. Step length decreased but no buckling noted with step through gait pattern. Gait velocity: slow Gait velocity interpretation: <1.31 ft/sec, indicative of household ambulator Pre-gait activities: small side step to [facility]   ADL: ADL Overall ADL's : Needs assistance/impaired Grooming: Set up, Sitting Lower Body Dressing: Sit to/from stand, Moderate assistance Lower Body Dressing Details (indicate cue type and reason): requires assist with R sock, relies on BUE support in standing Toilet Transfer: Minimal  assistance, Ambulation Toilet Transfer Details (indicate cue type and reason): simulated to recliner, using RW Functional mobility during ADLs: Minimal assistance, Rolling walker (2 wheels)   Cognition: Cognition Overall Cognitive Status: Within Functional Limits for tasks assessed Orientation Level: Oriented X4 Cognition Arousal/Alertness: Awake/alert Behavior During Therapy: WFL for tasks assessed/performed Overall Cognitive Status: Within Functional Limits for tasks assessed  Physical Exam: Last menstrual period 06/15/2022.  Physical Exam Gen: no distress, normal appearing HEENT: oral mucosa pink and moist, NCAT Cardio: Reg rate Chest: normal effort, normal rate of breathing Abd: soft, non-distended Ext: no edema Psych: pleasant, normal affect Skin:    Comments: Dry dressing in place to groin  Neurological:     Comments: Patient is alert oriented x 3 and follows commands. She is unable to elevate RLE but able to internally rotate  Results for orders placed or performed during the hospital encounter of 06/18/22 (from the past 48 hour(s))  Glucose, capillary     Status: Abnormal   Collection Time: 06/27/22  8:57 PM  Result Value Ref Range   Glucose-Capillary 162 (H) 70 - 99 mg/dL    Comment: Glucose reference range applies only to samples taken after fasting for at least 8 hours.  CBC     Status: Abnormal   Collection Time: 06/28/22  3:24 AM  Result Value Ref Range   WBC 20.6 (H) 4.0 - 10.5 K/uL   RBC 3.36 (L) 3.87 - 5.11 MIL/uL   Hemoglobin 10.0 (L) 12.0 - 15.0 g/dL   HCT 29.9 (L) 36.0 - 46.0 %   MCV 89.0 80.0 - 100.0 fL   MCH 29.8 26.0 - 34.0 pg   MCHC 33.4 30.0 - 36.0 g/dL   RDW 14.2 11.5 - 15.5 %   Platelets 146 (L) 150 - 400 K/uL   nRBC 0.4 (H) 0.0 - 0.2 %    Comment: Performed at [facility] [address] [place], [place] [address]  Renal function panel     Status: Abnormal   Collection Time: 06/28/22  3:24 AM  Result Value Ref Range   Sodium 146  (H) 135 - 145 mmol/L   Potassium 4.0 3.5 - 5.1 mmol/L   Chloride 117 (H) 98 - 111 mmol/L   CO2 20 (L) 22 - 32 mmol/L   Glucose, Bld 118 (H) 70 - 99 mg/dL    Comment: Glucose reference range applies only to samples taken after fasting for at least 8 hours.   BUN 70 (H) 6 - 20 mg/dL   Creatinine, Ser 5.22 (H) 0.44 - 1.00 mg/dL   Calcium 7.7 (L) 8.9 - 10.3 mg/dL   Phosphorus 5.3 (H) 2.5 - 4.6 mg/dL   Albumin 1.7 (L) 3.5 - 5.0 g/dL   GFR, Estimated 10 (L) >60 mL/min    Comment: (NOTE) Calculated using the CKD-EPI Creatinine Equation (2021)    Anion gap 9 5 - 15  Comment: Performed at [facility], [place] [address], [place], [place] [address]  Glucose, capillary     Status: Abnormal   Collection Time: 06/28/22  8:15 AM  Result Value Ref Range   Glucose-Capillary 167 (H) 70 - 99 mg/dL    Comment: Glucose reference range applies only to samples taken after fasting for at least 8 hours.  Glucose, capillary     Status: Abnormal   Collection Time: 06/28/22 12:19 PM  Result Value Ref Range   Glucose-Capillary 163 (H) 70 - 99 mg/dL    Comment: Glucose reference range applies only to samples taken after fasting for at least 8 hours.  Glucose, capillary     Status: Abnormal   Collection Time: 06/28/22  4:02 PM  Result Value Ref Range   Glucose-Capillary 141 (H) 70 - 99 mg/dL    Comment: Glucose reference range applies only to samples taken after fasting for at least 8 hours.  Glucose, capillary     Status: Abnormal   Collection Time: 06/28/22 10:01 PM  Result Value Ref Range   Glucose-Capillary 150 (H) 70 - 99 mg/dL    Comment: Glucose reference range applies only to samples taken after fasting for at least 8 hours.  CBC     Status: Abnormal   Collection Time: 06/29/22  5:27 AM  Result Value Ref Range   WBC 20.2 (H) 4.0 - 10.5 K/uL   RBC 3.21 (L) 3.87 - 5.11 MIL/uL   Hemoglobin 9.5 (L) 12.0 - 15.0 g/dL   HCT 28.3 (L) 36.0 - 46.0 %   MCV 88.2 80.0 - 100.0 fL   MCH 29.6 26.0 - 34.0  pg   MCHC 33.6 30.0 - 36.0 g/dL   RDW 14.4 11.5 - 15.5 %   Platelets 161 150 - 400 K/uL   nRBC 0.3 (H) 0.0 - 0.2 %    Comment: Performed at [facility] [address], [place], [place] [address]  Renal function panel     Status: Abnormal   Collection Time: 06/29/22  5:27 AM  Result Value Ref Range   Sodium 145 135 - 145 mmol/L   Potassium 4.1 3.5 - 5.1 mmol/L   Chloride 115 (H) 98 - 111 mmol/L   CO2 22 22 - 32 mmol/L   Glucose, Bld 105 (H) 70 - 99 mg/dL    Comment: Glucose reference range applies only to samples taken after fasting for at least 8 hours.   BUN 64 (H) 6 - 20 mg/dL   Creatinine, Ser 4.99 (H) 0.44 - 1.00 mg/dL   Calcium 7.7 (L) 8.9 - 10.3 mg/dL   Phosphorus 5.3 (H) 2.5 - 4.6 mg/dL   Albumin 1.7 (L) 3.5 - 5.0 g/dL   GFR, Estimated 10 (L) >60 mL/min    Comment: (NOTE) Calculated using the CKD-EPI Creatinine Equation (2021)    Anion gap 8 5 - 15    Comment: Performed at [facility] [address], [place], [place] [address]  Glucose, capillary     Status: Abnormal   Collection Time: 06/29/22  8:11 AM  Result Value Ref Range   Glucose-Capillary 100 (H) 70 - 99 mg/dL    Comment: Glucose reference range applies only to samples taken after fasting for at least 8 hours.  Glucose, capillary     Status: Abnormal   Collection Time: 06/29/22 11:56 AM  Result Value Ref Range   Glucose-Capillary 158 (H) 70 - 99 mg/dL    Comment: Glucose reference range  applies only to samples taken after fasting for at least 8 hours.   No results found.    Last menstrual period 06/15/2022.  Medical Problem List and Plan: 1. Functional deficits secondary to right iliopsoas abscess/right intrapelvic necrotizing fasciitis/right thigh necrotizing fasciitis.  Status post right thigh I&D as well as right pelvic I&D 06/18/2022  -patient may shower  -ELOS/Goals: 5-7 days modI  Admit to CIR 2.  Antithrombotics: -DVT/anticoagulation:  Pharmaceutical: Heparin check vascular  study  -antiplatelet therapy: N/A 3. Pain: continue Neurontin 100 mg 3 times daily, hydrocodone as needed 4. Depression: continue Zoloft 100 mg nightly.  Provide emotional support  -antipsychotic agents: N/A 5. Neuropsych/cognition: This patient is capable of making decisions on her own behalf. 6. Skin/Wound Care: Routine skin checks 7. Fluids/Electrolytes/Nutrition: Routine in and outs with follow-up chemistries 8.  Normocytic anemia.  Follow-up CBC.  Continue Aranesp 9.  AKI on CKD stage IV.  AKI likely due to ischemic ATN in the setting of sepsis physiology.  Follow-up renal services 10.  Diabetes mellitus.  Hemoglobin A1c 5.4.  SSI 11.  Oral candidiasis.  Completing course of Diflucan 12.  Hypertension.  Norvasc 10 mg daily.  Monitor with increased mobility 13.  Morbid obesity.  BMI 33.57. Follow-up dietary  I have personally performed a face to face diagnostic evaluation, including, but not limited to relevant history and physical exam findings, of this patient and developed relevant assessment and plan.  Additionally, I have reviewed and concur with the physician assistant's documentation above.  Lavon Paganini Angiulli, PA-C    [name], MD 06/29/2022

## 2022-06-29 NOTE — H&P (Incomplete)
Physical Medicine and Rehabilitation Admission H&P    Chief Complaint  Patient presents with   Flu-Like Symptoms   Fever  : HPI: Jacqueline [name] is a 41 year old right-handed female with history of hypertension, progressive CKD followed by [facility], morbid obesity with gastric bypass 2020, type 2 diabetes mellitus.  Per chart review lives with spouse and children.  Independent prior to admission.  Presented 06/18/2022 with right groin and leg pain x 2 days.  Patient developed low-grade fever as well as bouts of nausea vomiting.  Underwent CT abdomen pelvis with contrast concerning for necrotizing fasciitis in the right thigh masslike area measuring 6.8 x 6.0 x 7.8 cm along the right pelvic sidewall centered in the right psoas musculature with extension into the adjacent soft tissues, exerting substantial mass effect, particularly on the distal third of the right ureter with gas present in the deep soft tissue of the anterior compartment of the right thigh along the right iliac [place] musculature.  Noted WBC 27,800 lactic acid 1.9 hemoglobin 5.9 creatinine 5.57.  Patient underwent right thigh irrigation debridement as well as right pelvic irrigation debridement per Dr. Eustace Pen. Kinsinger12/08/2021.  She did return back to the OR [id number] for necrotizing infection undergoing wound exploration of right thigh and right groin with irrigation debridement and dressing change.  Patient maintained on broad-spectrum antibiotics.  She was extubated postop and taken to the PACU.  Follow-up for nephrology services for AKI on CKD stage IV likely due to ischemic ATN in the setting of sepsis with latest creatinine 4.99.  Acute blood loss anemia 9.5 and monitored.  She was cleared to begin subcutaneous heparin for DVT prophylaxis.  Antibiotic regimen transition to p.o. Augmentin which has been completed and currently receiving intravenous Diflucan x 7 doses for oral candidiasis.  Therapy evaluations completed due to  patient decreased functional mobility was admitted for a comprehensive rehab program.  Review of Systems  Constitutional:  Positive for fever and malaise/fatigue.  Gastrointestinal:  Positive for abdominal pain, nausea and vomiting.  Musculoskeletal:  Positive for joint pain and myalgias.       Groin pain  Psychiatric/Behavioral:  Positive for depression.        Anxiety  All other systems reviewed and are negative.  Past Medical History:  Diagnosis Date   Anemia    Anxiety    Chronic bronchitis (HCC)    Hypertension    Increased frequency of headaches    Morbid obesity ([place])    Sleep apnea    Type II diabetes mellitus [facility])    Past Surgical History:  Procedure Laterality Date   BARIATRIC SURGERY     BREAST REDUCTION SURGERY  03/21/2017   CARDIAC CATHETERIZATION  01/06/2016   CARDIAC CATHETERIZATION N/A 01/06/2016   Procedure: Left Heart Cath and Coronary Angiography;  Surgeon: Wellington Hampshire, MD;  Location: [facility];  Service: Cardiovascular;  Laterality: N/A;   CESAREAN SECTION  08/2014   I & D EXTREMITY Right 06/18/2022   Procedure: IRRIGATION AND DEBRIDEMENT RIGHT ABDOMEN AND THIGH;  Surgeon: Kinsinger, Arta Bruce, MD;  Location: [place];  Service: General;  Laterality: Right;   I & D EXTREMITY Right 06/20/2022   Procedure: WOUND EXPLORATION OF RIGHT THIGH AND RIGHT GROIN WITH IRRIGATION AND DEBRIDEMENT;  Surgeon: Jovita Kussmaul, MD;  Location: [place];  Service: General;  Laterality: Right;   REDUCTION MAMMAPLASTY Bilateral 03/21/2017   Family History  Problem Relation Age of Onset   Diabetes Mother  Hypertension Mother    Thyroid disease Mother    Kidney disease Maternal Grandmother    Diabetes Maternal Grandmother    Heart attack Other    Social History:  reports that she has never smoked. She has never used smokeless tobacco. She reports that she does not drink alcohol and does not use drugs. Allergies: No Known Allergies Medications Prior to Admission   Medication Sig Dispense Refill   amLODipine (NORVASC) 10 MG tablet Take 1 tablet (10 mg total) by mouth daily. 30 tablet 3   bumetanide (BUMEX) 1 MG tablet Take 1 mg by mouth every morning.     calcitRIOL (ROCALTROL) 0.25 MCG capsule Take 0.25 mcg by mouth daily.     carvedilol (COREG) 25 MG tablet Take 1 tablet (25 mg total) by mouth 2 (two) times daily with a meal. 60 tablet 0   diclofenac Sodium (VOLTAREN) 1 % GEL Apply 2 g topically 4 (four) times daily as needed (pain).      folic acid (FOLVITE) 1 MG tablet Take 1 mg by mouth daily.     furosemide (LASIX) 40 MG tablet Take 40 mg by mouth daily as needed for fluid or edema.     hydrALAZINE (APRESOLINE) 10 MG tablet Take 10 mg by mouth daily.     methocarbamol (ROBAXIN) 500 MG tablet Take 500 mg by mouth every 6 (six) hours as needed for muscle spasms.     sertraline (ZOLOFT) 100 MG tablet Take 100 mg by mouth at bedtime.     Vitamin D, Ergocalciferol, (DRISDOL) 1.25 MG (50000 UNIT) CAPS capsule Take 50,000 Units by mouth every Thursday.     gentamicin cream (GARAMYCIN) 0.1 % Apply 1 Application topically 2 (two) times daily. (Patient not taking: Reported on 06/18/2022) 30 g 1      Home: Home Living Family/patient expects to be discharged to:: Private residence Living Arrangements: Spouse/significant other, Children Available Help at Discharge: Family Type of Home: House Home Access: Level entry, Stairs to enter [employer] of Steps: 1 stoop Entrance Stairs-Rails: None Home Layout: Two level, Able to live on main level with bedroom/bathroom, Bed/bath upstairs Alternate Level Stairs-Number of Steps: flight Alternate Level Stairs-Rails: Right Bathroom Shower/Tub: Tub/shower unit, Multimedia programmer: Standard Bathroom Accessibility: Yes Home Equipment: None Additional Comments: pt lives with husband and 2 children (54 y.o. and 81 y.o.) her mother will come assist her at home as needed.   Functional  History: Prior Function Prior Level of Function : Independent/Modified Independent, Working/employed, Driving  Functional Status:  Mobility: Bed Mobility Overal bed mobility: Needs Assistance Bed Mobility: Supine to Sit Supine to sit: HOB elevated, Min guard Sit to supine: Min assist General bed mobility comments: cues to use bed rail, pt taking extra time and using UE's to assist Rt LE off bed. guarding for safety. Transfers Overall transfer level: Needs assistance Equipment used: Rolling walker (2 wheels) Transfers: Sit to/from Stand Sit to Stand: Min assist, +2 physical assistance, Max assist Bed to/from chair/wheelchair/BSC transfer type:: Step pivot Step pivot transfers: Min assist General transfer comment: Min assist for power up from EOB, pt able to initiate assist needed to steady with full rise to walker. pt required +2 Max assist from low toilet height. Ambulation/Gait Ambulation/Gait assistance: Min assist Gait Distance (Feet): 140 Feet Assistive device: Rolling walker (2 wheels) Gait Pattern/deviations: Step-to pattern, Decreased stance time - right, Decreased stride length, Decreased weight shift to right, Knees buckling, Shuffle, Trunk flexed, Step-through pattern General Gait Details: pt leading with  Lt LE to load Rt for advancement. pt required min assist to steady walker and cues to maintain safe position. pt ambualting with step to pattern and halfway transitioned to step through. Step length decreased but no buckling noted with step through gait pattern. Gait velocity: slow Gait velocity interpretation: <1.31 ft/sec, indicative of household ambulator Pre-gait activities: small side step to [facility]    ADL: ADL Overall ADL's : Needs assistance/impaired Grooming: Set up, Sitting Lower Body Dressing: Sit to/from stand, Moderate assistance Lower Body Dressing Details (indicate cue type and reason): requires assist with R sock, relies on BUE support in standing Toilet  Transfer: Minimal assistance, Ambulation Toilet Transfer Details (indicate cue type and reason): simulated to recliner, using RW Functional mobility during ADLs: Minimal assistance, Rolling walker (2 wheels)  Cognition: Cognition Overall Cognitive Status: Within Functional Limits for tasks assessed Orientation Level: Oriented X4 Cognition Arousal/Alertness: Awake/alert Behavior During Therapy: WFL for tasks assessed/performed Overall Cognitive Status: Within Functional Limits for tasks assessed  Physical Exam: Blood pressure (!) 145/79, pulse 84, temperature 98.4 F (36.9 C), temperature source Oral, resp. rate 13, height 5\' 5"  (1.651 m), weight 91.5 kg, last menstrual period 06/15/2022, SpO2 97 %. Physical Exam Skin:    Comments: Dry dressing in place to groin  Neurological:     Comments: Patient is alert oriented x 3 and follows commands.     Results for orders placed or performed during the hospital encounter of 06/18/22 (from the past 48 hour(s))  Glucose, capillary     Status: Abnormal   Collection Time: 06/27/22  4:30 PM  Result Value Ref Range   Glucose-Capillary 170 (H) 70 - 99 mg/dL    Comment: Glucose reference range applies only to samples taken after fasting for at least 8 hours.  Glucose, capillary     Status: Abnormal   Collection Time: 06/27/22  8:57 PM  Result Value Ref Range   Glucose-Capillary 162 (H) 70 - 99 mg/dL    Comment: Glucose reference range applies only to samples taken after fasting for at least 8 hours.  CBC     Status: Abnormal   Collection Time: 06/28/22  3:24 AM  Result Value Ref Range   WBC 20.6 (H) 4.0 - 10.5 K/uL   RBC 3.36 (L) 3.87 - 5.11 MIL/uL   Hemoglobin 10.0 (L) 12.0 - 15.0 g/dL   HCT 29.9 (L) 36.0 - 46.0 %   MCV 89.0 80.0 - 100.0 fL   MCH 29.8 26.0 - 34.0 pg   MCHC 33.4 30.0 - 36.0 g/dL   RDW 14.2 11.5 - 15.5 %   Platelets 146 (L) 150 - 400 K/uL   nRBC 0.4 (H) 0.0 - 0.2 %    Comment: Performed at [facility] [address], [place], [place] [address]  Renal function panel     Status: Abnormal   Collection Time: 06/28/22  3:24 AM  Result Value Ref Range   Sodium 146 (H) 135 - 145 mmol/L   Potassium 4.0 3.5 - 5.1 mmol/L   Chloride 117 (H) 98 - 111 mmol/L   CO2 20 (L) 22 - 32 mmol/L   Glucose, Bld 118 (H) 70 - 99 mg/dL    Comment: Glucose reference range applies only to samples taken after fasting for at least 8 hours.   BUN 70 (H) 6 - 20 mg/dL   Creatinine, Ser 5.22 (H) 0.44 - 1.00 mg/dL   Calcium 7.7 (L) 8.9 - 10.3 mg/dL   Phosphorus 5.3 (H) 2.5 -  4.6 mg/dL   Albumin 1.7 (L) 3.5 - 5.0 g/dL   GFR, Estimated 10 (L) >60 mL/min    Comment: (NOTE) Calculated using the CKD-EPI Creatinine Equation (2021)    Anion gap 9 5 - 15    Comment: Performed at [facility] [address], [place], [place] [address]  Glucose, capillary     Status: Abnormal   Collection Time: 06/28/22  8:15 AM  Result Value Ref Range   Glucose-Capillary 167 (H) 70 - 99 mg/dL    Comment: Glucose reference range applies only to samples taken after fasting for at least 8 hours.  Glucose, capillary     Status: Abnormal   Collection Time: 06/28/22 12:19 PM  Result Value Ref Range   Glucose-Capillary 163 (H) 70 - 99 mg/dL    Comment: Glucose reference range applies only to samples taken after fasting for at least 8 hours.  Glucose, capillary     Status: Abnormal   Collection Time: 06/28/22  4:02 PM  Result Value Ref Range   Glucose-Capillary 141 (H) 70 - 99 mg/dL    Comment: Glucose reference range applies only to samples taken after fasting for at least 8 hours.  Glucose, capillary     Status: Abnormal   Collection Time: 06/28/22 10:01 PM  Result Value Ref Range   Glucose-Capillary 150 (H) 70 - 99 mg/dL    Comment: Glucose reference range applies only to samples taken after fasting for at least 8 hours.  CBC     Status: Abnormal   Collection Time: 06/29/22  5:27 AM  Result Value Ref Range   WBC 20.2 (H) 4.0 - 10.5 K/uL    RBC 3.21 (L) 3.87 - 5.11 MIL/uL   Hemoglobin 9.5 (L) 12.0 - 15.0 g/dL   HCT 28.3 (L) 36.0 - 46.0 %   MCV 88.2 80.0 - 100.0 fL   MCH 29.6 26.0 - 34.0 pg   MCHC 33.6 30.0 - 36.0 g/dL   RDW 14.4 11.5 - 15.5 %   Platelets 161 150 - 400 K/uL   nRBC 0.3 (H) 0.0 - 0.2 %    Comment: Performed at [facility] [address] [place], [place] [address]  Renal function panel     Status: Abnormal   Collection Time: 06/29/22  5:27 AM  Result Value Ref Range   Sodium 145 135 - 145 mmol/L   Potassium 4.1 3.5 - 5.1 mmol/L   Chloride 115 (H) 98 - 111 mmol/L   CO2 22 22 - 32 mmol/L   Glucose, Bld 105 (H) 70 - 99 mg/dL    Comment: Glucose reference range applies only to samples taken after fasting for at least 8 hours.   BUN 64 (H) 6 - 20 mg/dL   Creatinine, Ser 4.99 (H) 0.44 - 1.00 mg/dL   Calcium 7.7 (L) 8.9 - 10.3 mg/dL   Phosphorus 5.3 (H) 2.5 - 4.6 mg/dL   Albumin 1.7 (L) 3.5 - 5.0 g/dL   GFR, Estimated 10 (L) >60 mL/min    Comment: (NOTE) Calculated using the CKD-EPI Creatinine Equation (2021)    Anion gap 8 5 - 15    Comment: Performed at [facility] [address] [place], [place] [address]  Glucose, capillary     Status: Abnormal   Collection Time: 06/29/22  8:11 AM  Result Value Ref Range   Glucose-Capillary 100 (H) 70 - 99 mg/dL    Comment: Glucose reference range applies only to samples taken after  fasting for at least 8 hours.  Glucose, capillary     Status: Abnormal   Collection Time: 06/29/22 11:56 AM  Result Value Ref Range   Glucose-Capillary 158 (H) 70 - 99 mg/dL    Comment: Glucose reference range applies only to samples taken after fasting for at least 8 hours.   No results found.    Blood pressure (!) 145/79, pulse 84, temperature 98.4 F (36.9 C), temperature source Oral, resp. rate 13, height 5\' 5"  (1.651 m), weight 91.5 kg, last menstrual period 06/15/2022, SpO2 97 %.  Medical Problem List and Plan: 1. Functional deficits secondary to right  iliopsoas abscess/right intrapelvic necrotizing fasciitis/right thigh necrotizing fasciitis.  Status post right thigh I&D as well as right pelvic I&D 06/18/2022  -patient may *** shower  -ELOS/Goals: *** 2.  Antithrombotics: -DVT/anticoagulation:  Pharmaceutical: Heparin check vascular study  -antiplatelet therapy: N/A 3. Pain Management: Neurontin 100 mg 3 times daily, hydrocodone as needed 4. Mood/Behavior/Sleep: Zoloft 100 mg nightly.  Provide emotional support  -antipsychotic agents: N/A 5. Neuropsych/cognition: This patient is capable of making decisions on her own behalf. 6. Skin/Wound Care: Routine skin checks 7. Fluids/Electrolytes/Nutrition: Routine in and outs with follow-up chemistries 8.  Normocytic anemia.  Follow-up CBC.  Continue Aranesp 9.  AKI on CKD stage IV.  AKI likely due to ischemic ATN in the setting of sepsis physiology.  Follow-up renal services 10.  Diabetes mellitus.  Hemoglobin A1c 5.4.  SSI 11.  Oral candidiasis.  Completing course of Diflucan 12.  Hypertension.  Norvasc 10 mg daily.  Monitor with increased mobility 13.  Morbid obesity.  BMI 33.57. Follow-up dietary   Cathlyn Parsons, PA-C 06/29/2022

## 2022-06-30 ENCOUNTER — Inpatient Hospital Stay (HOSPITAL_COMMUNITY): Payer: BC Managed Care – PPO

## 2022-06-30 DIAGNOSIS — M7989 Other specified soft tissue disorders: Secondary | ICD-10-CM

## 2022-06-30 DIAGNOSIS — R609 Edema, unspecified: Secondary | ICD-10-CM

## 2022-06-30 DIAGNOSIS — M726 Necrotizing fasciitis: Secondary | ICD-10-CM | POA: Diagnosis not present

## 2022-06-30 DIAGNOSIS — R278 Other lack of coordination: Secondary | ICD-10-CM | POA: Diagnosis not present

## 2022-06-30 DIAGNOSIS — I10 Essential (primary) hypertension: Secondary | ICD-10-CM

## 2022-06-30 DIAGNOSIS — N184 Chronic kidney disease, stage 4 (severe): Secondary | ICD-10-CM

## 2022-06-30 LAB — COMPREHENSIVE METABOLIC PANEL
ALT: 14 U/L (ref 0–44)
AST: 27 U/L (ref 15–41)
Albumin: 1.8 g/dL — ABNORMAL LOW (ref 3.5–5.0)
Alkaline Phosphatase: 76 U/L (ref 38–126)
Anion gap: 7 (ref 5–15)
BUN: 61 mg/dL — ABNORMAL HIGH (ref 6–20)
CO2: 23 mmol/L (ref 22–32)
Calcium: 7.9 mg/dL — ABNORMAL LOW (ref 8.9–10.3)
Chloride: 116 mmol/L — ABNORMAL HIGH (ref 98–111)
Creatinine, Ser: 5.37 mg/dL — ABNORMAL HIGH (ref 0.44–1.00)
GFR, Estimated: 10 mL/min — ABNORMAL LOW (ref >60)
Glucose, Bld: 157 mg/dL — ABNORMAL HIGH (ref 70–99)
Potassium: 4.7 mmol/L (ref 3.5–5.1)
Sodium: 146 mmol/L — ABNORMAL HIGH (ref 135–145)
Total Bilirubin: 0.5 mg/dL (ref 0.3–1.2)
Total Protein: 6.2 g/dL — ABNORMAL LOW (ref 6.5–8.1)

## 2022-06-30 LAB — CBC WITH DIFFERENTIAL/PLATELET
Abs Immature Granulocytes: 0.33 10*3/uL — ABNORMAL HIGH (ref 0.00–0.07)
Basophils Absolute: 0.1 10*3/uL (ref 0.0–0.1)
Basophils Relative: 1 %
Eosinophils Absolute: 0.2 10*3/uL (ref 0.0–0.5)
Eosinophils Relative: 1 %
HCT: 28.6 % — ABNORMAL LOW (ref 36.0–46.0)
Hemoglobin: 9.4 g/dL — ABNORMAL LOW (ref 12.0–15.0)
Immature Granulocytes: 2 %
Lymphocytes Relative: 10 %
Lymphs Abs: 2 10*3/uL (ref 0.7–4.0)
MCH: 29.9 pg (ref 26.0–34.0)
MCHC: 32.9 g/dL (ref 30.0–36.0)
MCV: 91.1 fL (ref 80.0–100.0)
Monocytes Absolute: 0.9 10*3/uL (ref 0.1–1.0)
Monocytes Relative: 4 %
Neutro Abs: 16.9 10*3/uL — ABNORMAL HIGH (ref 1.7–7.7)
Neutrophils Relative %: 82 %
Platelets: 234 10*3/uL (ref 150–400)
RBC: 3.14 MIL/uL — ABNORMAL LOW (ref 3.87–5.11)
RDW: 14.6 % (ref 11.5–15.5)
WBC: 20.4 10*3/uL — ABNORMAL HIGH (ref 4.0–10.5)
nRBC: 0.3 % — ABNORMAL HIGH (ref 0.0–0.2)

## 2022-06-30 LAB — GLUCOSE, CAPILLARY
Glucose-Capillary: 154 mg/dL — ABNORMAL HIGH (ref 70–99)
Glucose-Capillary: 154 mg/dL — ABNORMAL HIGH (ref 70–99)
Glucose-Capillary: 154 mg/dL — ABNORMAL HIGH (ref 70–99)
Glucose-Capillary: 175 mg/dL — ABNORMAL HIGH (ref 70–99)
Glucose-Capillary: 176 mg/dL — ABNORMAL HIGH (ref 70–99)
Glucose-Capillary: 175 mg/dL — ABNORMAL HIGH (ref 70–99)

## 2022-06-30 MED ORDER — GABAPENTIN 300 MG PO CAPS
300.0000 mg | ORAL_CAPSULE | Freq: Every day | ORAL | Status: DC
  Administered 2022-07-01 – 2022-07-07 (×7): 300 mg via ORAL
  Filled 2022-06-30 (×7): qty 1

## 2022-06-30 NOTE — Discharge Instructions (Addendum)
Inpatient Rehab Discharge Instructions  [facility] Discharge date and time: No discharge date for patient encounter.   Activities/Precautions/ Functional Status: Activity: activity as tolerated Diet: renal diet Wound Care: Routine skin checks Functional status:  ___ No restrictions     ___ Walk up steps independently ___ 24/7 supervision/assistance   ___ Walk up steps with assistance ___ Intermittent supervision/assistance  ___ Bathe/dress independently ___ Walk with walker     __x_ Bathe/dress with assistance ___ Walk Independently    ___ Shower independently ___ Walk with assistance    ___ Shower with assistance ___ No alcohol     ___ Return to work/school ________  Special Instructions: No driving smoking or alcohol  Wound VAC changes Monday Wednesday Friday.   COMMUNITY REFERRALS UPON DISCHARGE:    Home Health:   PT     OT     SN (wound vac changes MWF/lab draw)                [place]  *Please expect follow-up within 2-3 days to schedule your home visit. If you have not received follow-up, be sure to contact the branch directly.*   Medical Equipment/Items Ordered: wound vac                                                 Agency/Supplier: [employer] [phone]  Medical Equipment/Items Ordered: 3in1 bedside commode, rolling walker, wheelchair                                                 Agency/Supplier: [facility] [phone]  GENERAL COMMUNITY RESOURCES FOR PATIENT/FAMILY: You are eligible for transportation through your [facility] Medicaid [employer]. If you require transportation to any future medical appointments, please be sure to use [employer] #[phone]. Be sure to schedule appointments 3-5 business days in advance.    My questions have been answered and I understand these instructions. I will adhere to these goals and the provided educational materials after my discharge from the hospital.  Patient/Caregiver  Signature _______________________________ Date __________  Clinician Signature _______________________________________ Date __________  Please bring this form and your medication list with you to all your follow-up doctor's appointments.

## 2022-06-30 NOTE — Progress Notes (Signed)
Lower extremity venous duplex has been completed.   Preliminary results in CV Proc.   Jacqueline [name] Jacqueline [name] 06/30/2022 2:08 PM

## 2022-06-30 NOTE — Progress Notes (Signed)
Patient expressed this morning difficulty holding her phone with right hand, dominant hand. Observed spasticity with right fingers and right wrist.  Shelah Lewandowsky, LPN

## 2022-06-30 NOTE — Evaluation (Signed)
Physical Therapy Assessment and Plan  Patient Details  Name: Jacqueline [name] MRN: [id number] Date of Birth: 10-27-1980  PT Diagnosis: Difficulty walking and Muscle weakness Rehab Potential: Good ELOS: 5-7 Days   Today's Date: 06/30/2022 PT Individual Time: 5027-7412 and 1516-1630 PT Individual Time Calculation (min): 72 min and 74 min  Hospital Problem: Principal Problem:   Necrotizing fasciitis ([place]) Active Problems:   Debility   Past Medical History:  Past Medical History:  Diagnosis Date   Anemia    Anxiety    Chronic bronchitis ([place])    Hypertension    Increased frequency of headaches    Morbid obesity ([place])    Sleep apnea    Type II diabetes mellitus ([place])    Past Surgical History:  Past Surgical History:  Procedure Laterality Date   BARIATRIC SURGERY     BREAST REDUCTION SURGERY  03/21/2017   CARDIAC CATHETERIZATION  01/06/2016   CARDIAC CATHETERIZATION N/A 01/06/2016   Procedure: Left Heart Cath and Coronary Angiography;  Surgeon: Wellington Hampshire, MD;  Location: [place] [facility];  Service: Cardiovascular;  Laterality: N/A;   CESAREAN SECTION  08/2014   I & D EXTREMITY Right 06/18/2022   Procedure: IRRIGATION AND DEBRIDEMENT RIGHT ABDOMEN AND THIGH;  Surgeon: Kinsinger, Arta Bruce, MD;  Location: [place];  Service: General;  Laterality: Right;   I & D EXTREMITY Right 06/20/2022   Procedure: WOUND EXPLORATION OF RIGHT THIGH AND RIGHT GROIN WITH IRRIGATION AND DEBRIDEMENT;  Surgeon: Jovita Kussmaul, MD;  Location: [facility];  Service: General;  Laterality: Right;   REDUCTION MAMMAPLASTY Bilateral 03/21/2017    Assessment & Plan Clinical Impression: Patient is a 41 year old right-handed female with history of hypertension, progressive CKD followed by [facility], morbid obesity with gastric bypass 2020, type 2 diabetes mellitus. Per chart review lives with spouse and children. Independent prior to admission. Presented 06/18/2022 with right groin and leg pain x 2 days.  Patient developed low-grade fever as well as bouts of nausea vomiting. Underwent CT abdomen pelvis with contrast concerning for necrotizing fasciitis in the right thigh masslike area measuring 6.8 x 6.0 x 7.8 cm along the right pelvic sidewall centered in the right psoas musculature with extension into the adjacent soft tissues, exerting substantial mass effect, particularly on the distal third of the right ureter with gas present in the deep soft tissue of the anterior compartment of the right thigh along the right iliac [place] musculature. Noted WBC 27,800 lactic acid 1.9 hemoglobin 5.9 creatinine 5.57. Patient underwent right thigh irrigation debridement as well as right pelvic irrigation debridement per Dr. Eustace Pen. Kinsinger12/08/2021. She did return back to the OR [id number] for necrotizing infection undergoing wound exploration of right thigh and right groin with irrigation debridement and dressing change. Patient maintained on broad-spectrum antibiotics. She was extubated postop and taken to the PACU. Follow-up for nephrology services for AKI on CKD stage IV likely due to ischemic ATN in the setting of sepsis with latest creatinine 4.99. Acute blood loss anemia 9.5 and monitored. She was cleared to begin subcutaneous heparin for DVT prophylaxis. Antibiotic regimen transition to p.o. Augmentin which has been completed and currently receiving intravenous Diflucan x 7 doses for oral candidiasis.  Patient transferred to CIR on 06/29/2022 .   Patient currently requires  CGA and occasionally totalA due to buckling of R lower extremity,  with mobility secondary to muscle weakness, decreased cardiorespiratoy endurance, and decreased standing balance, decreased postural control, and decreased balance strategies.  Prior to hospitalization, patient  was independent  with mobility and lived with Spouse, Family in a House home.  Home access is 1 stoopLevel entry, Stairs to enter.  Patient will benefit from skilled PT  intervention to maximize safe functional mobility, minimize fall risk, and decrease caregiver burden for planned discharge home with intermittent assist.  Anticipate patient will benefit from follow up OP at discharge.  PT - End of Session Activity Tolerance: Tolerates 30+ min activity with multiple rests Endurance Deficit: Yes PT Assessment Rehab Potential (ACUTE/IP ONLY): Good PT Patient demonstrates impairments in the following area(s): Balance;Endurance;Motor;Safety;Sensory PT Transfers Functional Problem(s): Bed Mobility;Bed to Chair;Car;Furniture PT Locomotion Functional Problem(s): Ambulation;Stairs PT Plan PT Intensity: Minimum of 1-2 x/day ,45 to 90 minutes PT Frequency: 5 out of 7 days PT Duration Estimated Length of Stay: 5-7 Days PT Treatment/Interventions: Ambulation/gait training;Community reintegration;DME/adaptive equipment instruction;Neuromuscular re-education;Psychosocial support;Stair training;UE/LE Strength taining/ROM;Balance/vestibular training;Discharge planning;Functional electrical stimulation;Pain management;Skin care/wound management;Therapeutic Activities;UE/LE Coordination activities;Cognitive remediation/compensation;Functional mobility training;Disease management/prevention;Patient/family education;Splinting/orthotics;Therapeutic Exercise PT Transfers Anticipated Outcome(s): Supervision PT Locomotion Anticipated Outcome(s): Supervision PT Recommendation Follow Up Recommendations: Outpatient PT Patient destination: Home Equipment Recommended: To be determined   PT Evaluation Precautions/Restrictions Precautions Precautions: Fall Precaution Comments: RLE buckling - watch BP Restrictions Weight Bearing Restrictions: Yes RLE Weight Bearing: Weight bearing as tolerated General   Vital Signs Pain Pain Assessment Pain Scale: 0-10 Pain Score: 0-No pain Pain Interference Pain Interference Pain Effect on Sleep: 2. Occasionally Pain Interference with  Therapy Activities: 1. Rarely or not at all Pain Interference with Day-to-Day Activities: 1. Rarely or not at all Home Living/Prior [place] Available Help at Discharge: Family;Available 24 hours/day Type of Home: House Home Access: Level entry;Stairs to enter Entrance Stairs-Number of Steps: 1 stoop Home Layout: Two level;Able to live on main level with bedroom/bathroom;Bed/bath upstairs Alternate Level Stairs-Number of Steps: flight Alternate Level Stairs-Rails: Right Additional Comments: pt lives with husband and 2 children (70 y.o. and 96 y.o.) her mother will come assist her at home as needed.  Lives With: Spouse;Family Prior Function Level of Independence: Independent with basic ADLs;Independent with transfers;Independent with homemaking with ambulation;Independent with gait  Able to Take Stairs?: Yes Driving: Yes Leisure: Hobbies-yes (Comment) (Loves to cook) Vision/Perception  Perception Perception: Within Functional Limits Praxis Praxis: Intact  Cognition Overall Cognitive Status: Within Functional Limits for tasks assessed Arousal/Alertness: Awake/alert Orientation Level: Oriented X4 Memory: Appears intact Awareness: Appears intact Problem Solving: Appears intact Safety/Judgment: Appears intact Sensation Sensation Light Touch: Impaired by gross assessment Additional Comments: RLE 20% relative to LLE Coordination Gross Motor Movements are Fluid and Coordinated: Yes Fine Motor Movements are Fluid and Coordinated: Yes Motor  Motor Motor: Within Functional Limits Motor - Skilled Clinical Observations: Gross weakness of RLE  Trunk/Postural Assessment  Thoracic Assessment Thoracic Assessment: Within Functional Limits Lumbar Assessment Lumbar Assessment: Within Functional Limits Postural Control Postural Control: Deficits on evaluation (dimished protective responses)  Balance   Extremity Assessment  RLE Assessment RLE Assessment: Exceptions to  [facility] RLE Strength Right Hip Flexion: 2/5 Right Hip ABduction: 2+/5 Right Hip ADduction: 2+/5 Right Knee Flexion: 2+/5 Right Knee Extension: 2-/5 Right Ankle Dorsiflexion: 2+/5 LLE Assessment LLE Assessment: Within Functional Limits General Strength Comments: Grossly 4+/5  Care Tool Care Tool Bed Mobility Roll left and right activity   Roll left and right assist level: Minimal Assistance - Patient > 75%    Sit to lying activity   Sit to lying assist level: Minimal Assistance - Patient > 75%    Lying to sitting on side of bed activity  Lying to sitting on side of bed assist level: the ability to move from lying on the back to sitting on the side of the bed with no back support.: Minimal Assistance - Patient > 75%     Care Tool Transfers Sit to stand transfer   Sit to stand assist level: Contact Guard/Touching assist    Chair/bed transfer   Chair/bed transfer assist level: Contact Guard/Touching assist     Toilet transfer   Assist Level: Contact Guard/Touching assist    Car transfer   Car transfer assist level: Moderate Assistance - Patient 50 - 74%      Care Tool Locomotion Ambulation   Assist level: Total Assistance - Patient < 25% (due to buckling of RLE) Assistive device: Walker-rolling Max distance: 150'  Walk 10 feet activity   Assist level: Contact Guard/Touching assist Assistive device: Walker-rolling   Walk 50 feet with 2 turns activity   Assist level: Total Assistance - Patient < 25% Assistive device: Walker-rolling  Walk 150 feet activity   Assist level: Total Assistance - Patient < 25% Assistive device: Walker-rolling  Walk 10 feet on uneven surfaces activity   Assist level: Total Assistance - Patient < 25% Assistive device: Walker-rolling  Stairs   Assist level: Moderate Assistance - Patient - 50 - 74% Stairs assistive device: 2 hand rails Max number of stairs: 4  Walk up/down 1 step activity   Walk up/down 1 step (curb) assist level: Moderate  Assistance - Patient - 50 - 74% Walk up/down 1 step or curb assistive device: 2 hand rails  Walk up/down 4 steps activity   Walk up/down 4 steps assist level: Moderate Assistance - Patient - 50 - 74% Walk up/down 4 steps assistive device: 2 hand rails  Walk up/down 12 steps activity Walk up/down 12 steps activity did not occur: Safety/medical concerns      Pick up small objects from floor Pick up small object from the floor (from standing position) activity did not occur: Safety/medical concerns      Wheelchair Is the patient using a wheelchair?: No          Wheel 50 feet with 2 turns activity      Wheel 150 feet activity        Refer to Care Plan for [place] 1 PT Short Term Goal 1 (Week 1): STGs = LTGs  Recommendations for other services: None   Skilled Therapeutic Intervention  1st Session: Evaluation completed (see details above and below) with education on PT POC and goals and individual treatment initiated with focus on balance, bed mobility, transfers, stair training, and ambulation. Pt received supine in bed and agrees to therapy. Reports pain in R groin. Number not provided. PT provides rest breaks as needed to manage pain. Pt performs supine to sit with minA and cues for sequencing and positioning at EOB, with PT managing R lower extremity. Sit to stand with RW and CGA, with cues for hand placement and initiation. Pt ambulates x100' to ortho gym with RW and CGA. Following seated rest break, pt completes car transfer with cues for sequencing, and requires modA due to slipping from edge of seat and R lower extremity buckling. Pt then completes x150' ambulation with RW, including ramp navigation, and requires CGA for majority of distance but has one instance of R lower extremity buckling and requires totalA under arms to prevent fall. Seated rest break. Pt completes x4 6" steps with bilateral hand rails and requires  modA when mis-sequencing LE  advancement while descending steps. WC transport back to room for time management. Stand pivot to bed with RW and cues for positioning. MinA for sit to supine with management of R lower extremity. Left with alarm intact and all needs within reach.  2nd Session: Pt received supine in bed and agrees to therapy. Reports pain in R groin. Number not provided. PT provides rest breaks as needed to manage pain. Pt performs supine to sit with bed features and cues for positioning at EOB. Sit to stand with cues for hand placement and body mechanics. Pt performs stand step to [facility] with cues for positioning and using upper extremities to eccentrically control transfer for safety and strengthening. WC transport to gym. Pt performs stand step to Nustep with RW and same cues. Pt completes Nustep with R lower extremity supported with red theraband. Performed for endurance training and strengthening of R lower extremity. Pt completes x12:00 at workload of 5 with average steps per minute ~60. PT provides cues for hand and foot placement and completing full available ROM.  Pt transfers to mat with CGA and cues for positioning. Sit to supine with minA to manage R lower extremity. Pt performs supine therex for strengthening and increasing AROM of R lower extremity. Pt completes 1x15: Ankle pumps, quad sets, glute sets, hip ab/adduction, SAQs and SLRs (both with AAROM), and heel slides with AAROM. Rest breaks taken frequently due to muscular fatigue.   Supine to sit with cues for positioning. Stand pivot from mat>WC>bed with CGA. Sit to supine requires minA. Left supine with alarm intact and all needs within reach.   Mobility Bed Mobility Bed Mobility: Supine to Sit;Sit to Supine Supine to Sit: Minimal Assistance - Patient > 75% Sit to Supine: Minimal Assistance - Patient > 75% Transfers Transfers: Sit to Stand;Stand to Sit;Stand Pivot Transfers Sit to Stand: Contact Guard/Touching assist Stand to Sit: Contact  Guard/Touching assist Stand Pivot Transfers: Contact Guard/Touching assist Transfer (Assistive device): Rolling walker Locomotion  Gait Ambulation: Yes Gait Assistance: Total Assistance - Patient < 25% Gait Distance (Feet): 150 Feet Assistive device: Rolling walker Gait Assistance Details: Verbal cues for sequencing;Verbal cues for gait pattern;Verbal cues for technique Gait Assistance Details: primarily CGA but requires totalA when R lower extremity buckles Gait Gait: Yes Gait Pattern: Impaired Gait Pattern: Step-to pattern;Trunk flexed Gait velocity: slow Stairs / Additional Locomotion Stairs: Yes Stairs Assistance: Moderate Assistance - Patient 50 - 74% Stair Management Technique: Two rails Number of Stairs: 4 Height of Stairs: 6 Ramp: Total Assistance - Patient <25% Curb: Moderate Assistance - Patient 50 - 74% Wheelchair Mobility Wheelchair Mobility: No   Discharge Criteria: Patient will be discharged from PT if patient refuses treatment 3 consecutive times without medical reason, if treatment goals not met, if there is a change in medical status, if patient makes no progress towards goals or if patient is discharged from hospital.  The above assessment, treatment plan, treatment alternatives and goals were discussed and mutually agreed upon: by patient  Breck Coons, PT, DPT 06/30/2022, 5:01 PM

## 2022-06-30 NOTE — Progress Notes (Signed)
Inpatient Rehabilitation  Patient information reviewed and entered into eRehab system by Thaddeus Evitts Stacia Feazell, OTR/L, Rehab Quality Coordinator.   Information including medical coding, functional ability and quality indicators will be reviewed and updated through discharge.   

## 2022-06-30 NOTE — Plan of Care (Signed)
  Problem: RH Balance Goal: LTG Patient will maintain dynamic standing with ADLs (OT) Description: LTG:  Patient will maintain dynamic standing balance with assist during activities of daily living (OT)  Flowsheets (Taken 06/30/2022 1150) LTG: Pt will maintain dynamic standing balance during ADLs with: Independent with assistive device   Problem: Sit to Stand Goal: LTG:  Patient will perform sit to stand in prep for activites of daily living with assistance level (OT) Description: LTG:  Patient will perform sit to stand in prep for activites of daily living with assistance level (OT) Flowsheets (Taken 06/30/2022 1150) LTG: PT will perform sit to stand in prep for activites of daily living with assistance level: Independent with assistive device   Problem: RH Bathing Goal: LTG Patient will bathe all body parts with assist levels (OT) Description: LTG: Patient will bathe all body parts with assist levels (OT) Flowsheets (Taken 06/30/2022 1150) LTG: Pt will perform bathing with assistance level/cueing: Supervision/Verbal cueing   Problem: RH Dressing Goal: LTG Patient will perform upper body dressing (OT) Description: LTG Patient will perform upper body dressing with assist, with/without cues (OT). Flowsheets (Taken 06/30/2022 1150) LTG: Pt will perform upper body dressing with assistance level of: Independent Goal: LTG Patient will perform lower body dressing w/assist (OT) Description: LTG: Patient will perform lower body dressing with assist, with/without cues in positioning using equipment (OT) Flowsheets (Taken 06/30/2022 1150) LTG: Pt will perform lower body dressing with assistance level of: Independent with assistive device   Problem: RH Toileting Goal: LTG Patient will perform toileting task (3/3 steps) with assistance level (OT) Description: LTG: Patient will perform toileting task (3/3 steps) with assistance level (OT)  Flowsheets (Taken 06/30/2022 1150) LTG: Pt will perform  toileting task (3/3 steps) with assistance level: Independent with assistive device   Problem: RH Simple Meal Prep Goal: LTG Patient will perform simple meal prep w/assist (OT) Description: LTG: Patient will perform simple meal prep with assistance, with/without cues (OT). Flowsheets (Taken 06/30/2022 1150) LTG: Pt will perform simple meal prep with assistance level of: Supervision/Verbal cueing   Problem: RH Toilet Transfers Goal: LTG Patient will perform toilet transfers w/assist (OT) Description: LTG: Patient will perform toilet transfers with assist, with/without cues using equipment (OT) Flowsheets (Taken 06/30/2022 1150) LTG: Pt will perform toilet transfers with assistance level of: Independent with assistive device   Problem: RH Tub/Shower Transfers Goal: LTG Patient will perform tub/shower transfers w/assist (OT) Description: LTG: Patient will perform tub/shower transfers with assist, with/without cues using equipment (OT) Flowsheets (Taken 06/30/2022 1150) LTG: Pt will perform tub/shower stall transfers with assistance level of: Supervision/Verbal cueing

## 2022-06-30 NOTE — Progress Notes (Addendum)
Inpatient Rehabilitation Care Coordinator Assessment and Plan Patient Details  Name: Jacqueline [name] MRN: [id number] Date of Birth: 1980/07/24  Today's Date: 06/30/2022  Hospital Problems: Principal Problem:   Necrotizing fasciitis [facility]) Active Problems:   Debility  Past Medical History:  Past Medical History:  Diagnosis Date   Anemia    Anxiety    Chronic bronchitis ([facility])    Hypertension    Increased frequency of headaches    Morbid obesity ([place])    Sleep apnea    Type II diabetes mellitus ([place])    Past Surgical History:  Past Surgical History:  Procedure Laterality Date   BARIATRIC SURGERY     BREAST REDUCTION SURGERY  03/21/2017   CARDIAC CATHETERIZATION  01/06/2016   CARDIAC CATHETERIZATION N/A 01/06/2016   Procedure: Left Heart Cath and Coronary Angiography;  Surgeon: Wellington Hampshire, MD;  Location: [place] [facility];  Service: Cardiovascular;  Laterality: N/A;   CESAREAN SECTION  08/2014   I & D EXTREMITY Right 06/18/2022   Procedure: IRRIGATION AND DEBRIDEMENT RIGHT ABDOMEN AND THIGH;  Surgeon: Kinsinger, Arta Bruce, MD;  Location: [place];  Service: General;  Laterality: Right;   I & D EXTREMITY Right 06/20/2022   Procedure: WOUND EXPLORATION OF RIGHT THIGH AND RIGHT GROIN WITH IRRIGATION AND DEBRIDEMENT;  Surgeon: Jovita Kussmaul, MD;  Location: [place];  Service: General;  Laterality: Right;   REDUCTION MAMMAPLASTY Bilateral 03/21/2017   Social History:  reports that she has never smoked. She has never used smokeless tobacco. She reports that she does not drink alcohol and does not use drugs.  Family / Support Systems Marital Status: Married How Long?: 15 years Patient Roles: Spouse, Parent Spouse/Significant Other: Peral (husband) Children: Two children 70 and 44 yr old son. Other Supports: Mother Anticipated Caregiver: Mother and husband Ability/Limitations of Caregiver: no limitations reported Caregiver Availability: 24/7 Family Dynamics: Pt lives with her  husband and their children, and pt mother lives in the home as well.  Social History Preferred language: English Religion: Pentecostal Cultural Background: Pt relocated from [place] 7 yrs ago. 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 Pt has FMLA forms and SW will have PA complete. Legal History/Current Legal Issues: Denies Guardian/Conservator: N/A   Abuse/Neglect Abuse/Neglect Assessment Can Be Completed: Yes Physical Abuse: Denies Verbal Abuse: Denies Sexual Abuse: Denies Exploitation of patient/patient's resources: Denies Self-Neglect: Denies  Patient response to: Social Isolation - How often do you feel lonely or isolated from those around you?: Never  Emotional Status Pt's affect, behavior and adjustment status: Pt in good spirits. Recent Psychosocial Issues: None reported Psychiatric History: Pt admits to a hx of depression and takes Zoloft prescribed by PCP Substance Abuse History: Denies  Patient / Family Perceptions, Expectations & Goals Pt/Family understanding of illness & functional limitations: Pt and family have a general understanding of pt care needs Premorbid pt/family roles/activities: Independent Anticipated changes in roles/activities/participation: Assistance with ADLs/IADLs Pt/family expectations/goals: pt goal is to work on moving around and building up strength in legs and getting her strength back.  Community Resources [employer]: None Premorbid Home Care/DME Agencies: None Transportation available at discharge: TBD Is the patient able to respond to transportation needs?: No In  the past 12 months, has lack of  transportation kept you from medical appointments or from getting medications?: No In the past 12 months, has lack of transportation kept you from meetings, work, or from getting things needed for daily living?: No Resource referrals recommended: Neuropsychology  Discharge Planning Living Arrangements: Spouse/significant other, Children, Parent Support Systems: Spouse/significant other, Children, Parent Type of Residence: Private residence Insurance Resources: Multimedia programmer (specify) (BCBS and [place] Medicaid [place]) Financial Resources: Employment Financial Screen Referred: No Living Expenses: Medical laboratory scientific officer Management: Spouse Does the patient have any problems obtaining your medications?: No Home Management: Pt manages all homecare needs Patient/Family Preliminary Plans: TBD Care Coordinator Anticipated Follow Up Needs: HH/OP  Clinical Impression SW met with pt and pt mother in room to introduce self, explain role, and discuss discharge process. Pt is not a English as a second language teacher. No HCPOA. No DME. SW discussed assistance with wound care management. Pt states she will have support from mother and husband, and also comfortable with changing as well. No HHA preference.   SW sent out HHSN/PT/OT referral. *Update- EMR reflects [facility] is already following patient. [facility] aware she is on unit.   Rana Snare 06/30/2022, 4:33 PM

## 2022-06-30 NOTE — Progress Notes (Signed)
Occupational Therapy Assessment and Plan  Patient Details  Name: Jacqueline [name] MRN: [id number] Date of Birth: 12/02/1980  OT Diagnosis: muscle weakness (generalized) and balance Rehab Potential: Rehab Potential (ACUTE ONLY): Excellent ELOS: 7 days   Today's Date: 06/30/2022 OT Individual Time: 1112-1209 OT Individual Time Calculation (min): 57 min     Hospital Problem: Principal Problem:   Necrotizing fasciitis ([place]) Active Problems:   Debility   Past Medical History:  Past Medical History:  Diagnosis Date   Anemia    Anxiety    Chronic bronchitis ([facility])    Hypertension    Increased frequency of headaches    Morbid obesity ([place])    Sleep apnea    Type II diabetes mellitus ([place])    Past Surgical History:  Past Surgical History:  Procedure Laterality Date   BARIATRIC SURGERY     BREAST REDUCTION SURGERY  03/21/2017   CARDIAC CATHETERIZATION  01/06/2016   CARDIAC CATHETERIZATION N/A 01/06/2016   Procedure: Left Heart Cath and Coronary Angiography;  Surgeon: Wellington Hampshire, MD;  Location: [facility];  Service: Cardiovascular;  Laterality: N/A;   CESAREAN SECTION  08/2014   I & D EXTREMITY Right 06/18/2022   Procedure: IRRIGATION AND DEBRIDEMENT RIGHT ABDOMEN AND THIGH;  Surgeon: Kinsinger, Arta Bruce, MD;  Location: [facility];  Service: General;  Laterality: Right;   I & D EXTREMITY Right 06/20/2022   Procedure: WOUND EXPLORATION OF RIGHT THIGH AND RIGHT GROIN WITH IRRIGATION AND DEBRIDEMENT;  Surgeon: Jovita Kussmaul, MD;  Location: [facility];  Service: General;  Laterality: Right;   REDUCTION MAMMAPLASTY Bilateral 03/21/2017    Assessment & Plan Clinical Impression: Patient is a 41 y.o. year old female with recent admission to the hospital on   12/02  for Rt psoas abscess and pelvic necrotizing fasciitis extendign to Rt thigh. Now s/p excisional debridement of skin, muscle (iliacus, psoas, transversus abdominis), fascia of pelvis, abdominal wall and retroperitoneum  dissection on 12/2. PMHx: anemia, axiety, HTN, and DMII.  Patient transferred to CIR on 06/29/2022 .    Patient currently requires mod with basic self-care skills secondary to muscle weakness, decreased cardiorespiratoy endurance, and decreased standing balance and decreased balance strategies.  Prior to hospitalization, patient could complete BADL with independent .  Patient will benefit from skilled intervention to increase independence with basic self-care skills prior to discharge home with care partner.  Anticipate patient will require intermittent supervision and follow up outpatient.  OT - End of Session Endurance Deficit: Yes Endurance Deficit Description: Rest breaks within BADL tasks OT Assessment Rehab Potential (ACUTE ONLY): Excellent OT Barriers to Discharge: Wound Care OT Barriers to Discharge Comments: Has family that can hopefully assist as well OT Patient demonstrates impairments in the following area(s): Balance;Motor;Endurance;Pain;Sensory;Edema OT Basic ADL's Functional Problem(s): Bathing;Toileting;Dressing OT Advanced ADL's Functional Problem(s): Simple Meal Preparation OT Transfers Functional Problem(s): Toilet;Tub/Shower OT Additional Impairment(s): None OT Plan OT Intensity: Minimum of 1-2 x/day, 45 to 90 minutes OT Frequency: 5 out of 7 days OT Duration/Estimated Length of Stay: 7 days OT Treatment/Interventions: Balance/vestibular training;Cognitive remediation/compensation;Community reintegration;Discharge planning;Disease mangement/prevention;DME/adaptive equipment instruction;Functional electrical stimulation;Functional mobility training;Neuromuscular re-education;Pain management;Patient/family education;Psychosocial support;Self Care/advanced ADL retraining;Skin care/wound managment;Splinting/orthotics;Therapeutic Activities;Therapeutic Exercise;UE/LE Strength taining/ROM;UE/LE Coordination activities;Visual/perceptual remediation/compensation;Wheelchair  propulsion/positioning OT Self Feeding Anticipated Outcome(s): n/a OT Basic Self-Care Anticipated Outcome(s): supervision/mod I OT Toileting Anticipated Outcome(s): supervision/mod I OT Bathroom Transfers Anticipated Outcome(s): Supervision OT Recommendation Patient destination: Home Follow Up Recommendations: Outpatient OT Equipment Recommended: To be determined Equipment Details: Likely a 3-on-1 BSC  OT Evaluation Precautions/Restrictions  Precautions Precautions: Fall Precaution Comments: RLE buckling - watch BP Restrictions Weight Bearing Restrictions: Yes RLE Weight Bearing: Weight bearing as tolerated Pain Pain Assessment Pain Scale: 0-10 Pain Score: 0-No pain Home Living/Prior Functioning Home Living Family/patient expects to be discharged to:: Private residence Living Arrangements: Spouse/significant other, Children Available Help at Discharge: Family, Available 24 hours/day Type of Home: House Home Access: Level entry, Stairs to enter [employer] of Steps: 1 stoop Home Layout: Two level, Able to live on main level with bedroom/bathroom, Bed/bath upstairs Alternate Level Stairs-Number of Steps: flight Alternate Level Stairs-Rails: Right Bathroom Shower/Tub: Tub/shower unit, Multimedia programmer: Standard Bathroom Accessibility: Yes Additional Comments: pt lives with husband and 2 children (1 y.o. and 17 y.o.) her mother will come assist her at home as needed.  Lives With: Spouse, Family IADL History Homemaking Responsibilities: Yes Current License: Yes Type of Occupation: Works at [employer] [place] with [employer] Prior Function Level of Independence: Independent with basic ADLs, Independent with homemaking with ambulation  Able to [place]?: Yes Driving: Yes Leisure: Hobbies-yes (Comment) (Enjoys cooking) Vision Baseline Vision/History: 1 Wears glasses Ability to See in Adequate Light: 1 Impaired Patient Visual Report: No change  from baseline Perception  Perception: Within Functional Limits Praxis Praxis: Intact Cognition Cognition Overall Cognitive Status: Within Functional Limits for tasks assessed Arousal/Alertness: Awake/alert Orientation Level: Person;Place;Situation Person: Oriented Place: Oriented Situation: Oriented Memory: Appears intact Awareness: Appears intact Problem Solving: Appears intact Safety/Judgment: Appears intact Brief Interview for Mental Status (BIMS) Repetition of Three Words (First Attempt): 3 Temporal Orientation: Year: Correct Temporal Orientation: Month: Accurate within 5 days Temporal Orientation: Day: Correct Recall: "Sock": Yes, no cue required Recall: "Blue": Yes, no cue required Recall: "Bed": Yes, no cue required BIMS Summary Score: 15 Sensation Sensation Light Touch: Impaired Detail Light Touch Impaired Details: Impaired RLE Additional Comments: RLE 20% relative to LLE Coordination Gross Motor Movements are Fluid and Coordinated: Yes Fine Motor Movements are Fluid and Coordinated: Yes Motor  Motor Motor: Within Functional Limits Motor - Skilled Clinical Observations: Gross weakness of RLE  Trunk/Postural Assessment  Thoracic Assessment Thoracic Assessment: Within Functional Limits Lumbar Assessment Lumbar Assessment: Within Functional Limits Postural Control Postural Control: Deficits on evaluation  Balance Balance Balance Assessed: Yes Static Sitting Balance Static Sitting - Balance Support: Feet supported Static Sitting - Level of Assistance: 7: Independent Dynamic Sitting Balance Dynamic Sitting - Balance Support: Feet supported Dynamic Sitting - Level of Assistance: 7: Independent Static Standing Balance Static Standing - Balance Support: During functional activity Static Standing - Level of Assistance: 4: Min assist Dynamic Standing Balance Dynamic Standing - Balance Support: During functional activity Dynamic Standing - Level of Assistance:  4: Min assist Extremity/Trunk Assessment RUE Assessment RUE Assessment: Within Functional Limits LUE Assessment LUE Assessment: Within Functional Limits  Care Tool Care Tool Self Care Eating   Eating Assist Level: Independent    Oral Care    Oral Care Assist Level: Independent    Bathing   Body parts bathed by patient: Left arm;Right arm;Chest;Abdomen;Front perineal area;Face Body parts bathed by helper: Right lower leg;Left lower leg;Left upper leg;Right upper leg;Buttocks   Assist Level: Moderate Assistance - Patient 50 - 74%    Upper Body Dressing(including orthotics)   What is the patient wearing?: Pull over shirt;Bra   Assist Level: Supervision/Verbal cueing    Lower Body Dressing (excluding footwear)   What is the patient wearing?: Pants;Underwear/pull up Assist for lower body dressing: Maximal Assistance - Patient 25 -  49%    Putting on/Taking off footwear     Assist for footwear: Maximal Assistance - Patient 25 - 49%       Care Tool Toileting Toileting activity   Assist for toileting: Moderate Assistance - Patient 50 - 74%     Care Tool Bed Mobility Roll left and right activity   Roll left and right assist level: Minimal Assistance - Patient > 75%    Sit to lying activity   Sit to lying assist level: Minimal Assistance - Patient > 75%    Lying to sitting on side of bed activity   Lying to sitting on side of bed assist level: the ability to move from lying on the back to sitting on the side of the bed with no back support.: Minimal Assistance - Patient > 75%     Care Tool Transfers Sit to stand transfer   Sit to stand assist level: Contact Guard/Touching assist    Chair/bed transfer   Chair/bed transfer assist level: Minimal Assistance - Patient > 75%     Toilet transfer   Assist Level: Minimal Assistance - Patient > 75%     Care Tool Cognition  Expression of Ideas and Wants Expression of Ideas and Wants: 4. Without difficulty (complex and basic)  - expresses complex messages without difficulty and with speech that is clear and easy to understand  Understanding Verbal and Non-Verbal Content Understanding Verbal and Non-Verbal Content: 4. Understands (complex and basic) - clear comprehension without cues or repetitions   Memory/Recall Ability Memory/Recall Ability : Staff names and faces;Current season;Location of own room;That he or she is in a hospital/hospital unit   Refer to Care Plan for [place] 1 OT Short Term Goal 1 (Week 1): LTG=STG 2/2 ELOS  Recommendations for other services: None    Skilled Therapeutic Intervention OT eval completed addressing rehab process, OT purpose, POC, ELOS, and goals.  Pt needed OT assist to lift R LE for bed mobility. Sit<>stan at EOB with RW and CGA/min A for bathroom transfers and stand-pivot transfers.Overall supervision for UB for BADL tasks, but mod/max A for LB BADL tasks due to R LE pain and weakness. Pt limited to sponge baths for now due to wound. Her largest deficits is R LE weakness and knee buckling. See below for further details regarding BADL performance.   ADL ADL Eating: Independent Grooming: Independent Upper Body Bathing: Setup;Supervision/safety Lower Body Bathing: Maximal assistance Upper Body Dressing: Setup;Supervision/safety Lower Body Dressing: Maximal assistance Toileting: Minimal assistance Toilet Transfer: Minimal assistance Mobility  Bed Mobility Bed Mobility: Supine to Sit;Sit to Supine Supine to Sit: Minimal Assistance - Patient > 75% Sit to Supine: Minimal Assistance - Patient > 75% Transfers Sit to Stand: Contact Guard/Touching assist Stand to Sit: Contact Guard/Touching assist   Discharge Criteria: Patient will be discharged from OT if patient refuses treatment 3 consecutive times without medical reason, if treatment goals not met, if there is a change in medical status, if patient makes no progress towards goals or if patient  is discharged from hospital.  The above assessment, treatment plan, treatment alternatives and goals were discussed and mutually agreed upon: by patient and by family  Valma Cava 06/30/2022, 11:51 AM

## 2022-06-30 NOTE — Progress Notes (Signed)
PROGRESS NOTE   Subjective/Complaints: Had a reasonable night. Reports twitching/tremor in her hands as well as eye lids coming on recently. Some discomfort from hypogastric area wound.   ROS: Patient denies fever, rash, sore throat, blurred vision, dizziness, nausea, vomiting, diarrhea, cough, shortness of breath or chest pain,   headache, or mood change.    Objective:   No results found. Recent Labs    06/29/22 0527 06/30/22 0616  WBC 20.2* 20.4*  HGB 9.5* 9.4*  HCT 28.3* 28.6*  PLT 161 234   Recent Labs    06/29/22 0527 06/30/22 0616  NA 145 146*  K 4.1 4.7  CL 115* 116*  CO2 22 23  GLUCOSE 105* 157*  BUN 64* 61*  CREATININE 4.99* 5.37*  CALCIUM 7.7* 7.9*    Intake/Output Summary (Last 24 hours) at 06/30/2022 0924 Last data filed at 06/29/2022 2139 Gross per 24 hour  Intake 480 ml  Output --  Net 480 ml        Physical Exam: Vital Signs Blood pressure (!) 150/75, pulse 75, temperature 98.1 F (36.7 C), temperature source Oral, resp. rate 18, height 5\' 5"  (1.651 m), weight 86.2 kg, last menstrual period 06/15/2022, SpO2 (!) 87 %.  General: Alert and oriented x 3, No apparent distress HEENT: Head is normocephalic, atraumatic, PERRLA, EOMI, sclera anicteric, oral mucosa pink and moist, dentition intact, ext ear canals clear,  Neck: Supple without JVD or lymphadenopathy Heart: Reg rate and rhythm. No murmurs rubs or gallops Chest: CTA bilaterally without wheezes, rales, or rhonchi; no distress Abdomen: Soft, non-tender, non-distended, bowel sounds positive. Extremities: No clubbing, cyanosis, or edema. Pulses are 2+ Psych: Pt's affect is appropriate. Pt is cooperative Skin: Large open antero-medial wound on right thigh with 90% granulation tissue with exposed fat and muscle. Similar smaller wound just above in hypogastric area.  Red areas under breasts--appear to be drying up Neuro:  Alert and oriented  x 3. Normal insight and awareness. Intact Memory. Normal language and speech. Cranial nerve exam unremarkable. UE motor 5/5. LE limited by open wound, discomfort. ADF/APF 4/5. No focal sensory findings. Does high frequency tremor, ?asterixis   Musculoskeletal: normal rom where testable    Assessment/Plan: 1. Functional deficits which require 3+ hours per day of interdisciplinary therapy in a comprehensive inpatient rehab setting. Physiatrist is providing close team supervision and 24 hour management of active medical problems listed below. Physiatrist and rehab team continue to assess barriers to discharge/monitor patient progress toward functional and medical goals  Care Tool:  Bathing              Bathing assist       Upper Body Dressing/Undressing Upper body dressing        Upper body assist      Lower Body Dressing/Undressing Lower body dressing            Lower body assist       Toileting Toileting    Toileting assist       Transfers Chair/bed transfer  Transfers assist           Locomotion Ambulation   Ambulation assist  Walk 10 feet activity   Assist           Walk 50 feet activity   Assist           Walk 150 feet activity   Assist           Walk 10 feet on uneven surface  activity   Assist           Wheelchair     Assist               Wheelchair 50 feet with 2 turns activity    Assist            Wheelchair 150 feet activity     Assist          Blood pressure (!) 150/75, pulse 75, temperature 98.1 F (36.7 C), temperature source Oral, resp. rate 18, height 5\' 5"  (1.651 m), weight 86.2 kg, last menstrual period 06/15/2022, SpO2 (!) 87 %.  Medical Problem List and Plan: 1. Functional deficits secondary to right iliopsoas abscess/right intrapelvic necrotizing fasciitis/right thigh necrotizing fasciitis.  Status post right thigh I&D as well as right pelvic I&D  06/18/2022             -patient may shower             -ELOS/Goals: 5-7 days modI            -Patient is beginning CIR therapies today including PT and OT  2.  Antithrombotics: -DVT/anticoagulation:  Pharmaceutical: Heparin check vascular study             -antiplatelet therapy: N/A 3. Pain: continue Neurontin 100 mg 3 times daily--change to 300mg  qhs, hydrocodone as needed 4. Depression: continue Zoloft 100 mg nightly.  Provide emotional support             -antipsychotic agents: N/A 5. Neuropsych/cognition: This patient is capable of making decisions on her own behalf. 6. Skin/Wound Care: Routine skin checks  -wet to dry packing of open stage iii wounds.   -silver dressing to breasts. Keep area dry.  7. Fluids/Electrolytes/Nutrition: Routine in and outs with follow-up chemistries   -labs with HD per nephrology 8.  Normocytic anemia.  Follow-up CBC.  Continue Aranesp 9.  AKI on CKD stage IV.  AKI likely due to ischemic ATN in the setting of sepsis physiology.  Follow-up renal services 10.  Diabetes mellitus.  Hemoglobin A1c 5.4.  SSI 11.  Oral candidiasis.  Completing course of Diflucan 12.  Hypertension.  Norvasc 10 mg daily.    -observe today 13.  Morbid obesity.  BMI 33.57. Follow-up dietary 14. Asterixis? Advised pt to review with nephrology when they see here. I don't see any obvious pharmaceutical culprits. Gabapentin is dosed appropriately---could be given just a one time however.    LOS: 1 days A FACE TO FACE EVALUATION WAS PERFORMED  Meredith Staggers 06/30/2022, 9:24 AM

## 2022-07-01 DIAGNOSIS — F4321 Adjustment disorder with depressed mood: Secondary | ICD-10-CM

## 2022-07-01 LAB — GLUCOSE, CAPILLARY
Glucose-Capillary: 142 mg/dL — ABNORMAL HIGH (ref 70–99)
Glucose-Capillary: 156 mg/dL — ABNORMAL HIGH (ref 70–99)
Glucose-Capillary: 163 mg/dL — ABNORMAL HIGH (ref 70–99)
Glucose-Capillary: 177 mg/dL — ABNORMAL HIGH (ref 70–99)

## 2022-07-01 MED ORDER — FLUCONAZOLE 100 MG PO TABS
100.0000 mg | ORAL_TABLET | Freq: Every day | ORAL | Status: AC
  Administered 2022-07-02 – 2022-07-03 (×2): 100 mg via ORAL
  Filled 2022-07-01 (×2): qty 1

## 2022-07-01 NOTE — Progress Notes (Signed)
Occupational Therapy Session Note  Patient Details  Name: Jacqueline [name] MRN: [id number] Date of Birth: 1982-08-272  Today's Date: 07/01/2022 OT Individual Time: 9678-9381 OT Individual Time Calculation (min): 70 min    Short Term Goals: Week 1:  OT Short Term Goal 1 (Week 1): LTG=STG 2/2 ELOS  Skilled Therapeutic Interventions/Progress Updates:    Pt greeted semi-reclined in bed and agreeable to OT treatment session. Pt reported she had already washed up and gotten dressed today. OT educated on use of gait belt as a leg lifter for bed mobility. Pt was able to completed bed mobility with HOB elevated. Pt stood w/ RW and CGA. She then ambulated 15 feet w/ RW and CGA. No knee buckling. Pt brought to therapy gym and ambulate another 30 feet w/ RW and CGA -no knee buckle. Worked on R LE strength with ball kick. OT had to place towel under R thigh to lift LE off due to trace hip extension, but then able to extend knee to kick. Addressed standing balance/endurance standing without AD and CGA. Worked on weight shifting R and maintaining knee extension- pt with hperextension of R knee. Pt then completed 3 sets of 10 chest press, bicep curl, and straight arm raise. Progressed to standing on foam block, then completed 3 stes of 10 triceps press with small ball. Pt completed 5 sit<>stands x3 with seated rest breaks using only LE's and hands on knees. OT provided stability to R knee during stands. Pt needed rocking and mod-min A to get into standing using this technique. Pt completed 5 minutes forward, then 5 minutes backwards on [employer] arm bike for UB strength and endurance. Pt returned to room in wc in preparation for next therapy session.   Therapy Documentation Precautions:  Precautions Precautions: Fall Precaution Comments: RLE buckling - watch BP Restrictions Weight Bearing Restrictions: Yes RLE Weight Bearing: Weight bearing as tolerated Pain:  Pt reported pain was under control   Therapy/Group:  Individual Therapy  Valma Cava 07/01/2022, 8:12 AM

## 2022-07-01 NOTE — Progress Notes (Addendum)
PROGRESS NOTE   Subjective/Complaints: Pt up with OT in gym. Still having twitching in arms. Reports some tingling/burning in legs. Otherwise therapy went well and had a good day yesterda  ROS: Patient denies fever, rash, sore throat, blurred vision, dizziness, nausea, vomiting, diarrhea, cough, shortness of breath or chest pain,   headache, or mood change. .    Objective:   VAS [place] LOWER EXTREMITY VENOUS (DVT)  Result Date: 06/30/2022  Lower Venous DVT Study Patient Name:  Jacqueline [name]  Date of Exam:   06/30/2022 Medical Rec #: [id number]     Accession #:    [id number] Date of Birth: 1980-08-09     Patient Gender: F Patient Age:   41 years Exam Location:  [facility] Procedure:      VAS [place] LOWER EXTREMITY VENOUS (DVT) Referring Phys: Lauraine Rinne --------------------------------------------------------------------------------  Indications: Swelling, and Edema.  Limitations: Right thigh bandaging. Comparison Study: 08/17/20 prior Performing Technologist: Archie Patten RVS  Examination Guidelines: A complete evaluation includes B-mode imaging, spectral Doppler, color Doppler, and power Doppler as needed of all accessible portions of each vessel. Bilateral testing is considered an integral part of a complete examination. Limited examinations for reoccurring indications may be performed as noted. The reflux portion of the exam is performed with the patient in reverse Trendelenburg.  +---------+---------------+---------+-----------+----------+-------------------+ RIGHT    CompressibilityPhasicitySpontaneityPropertiesThrombus Aging      +---------+---------------+---------+-----------+----------+-------------------+ CFV      Full           Yes      Yes                                      +---------+---------------+---------+-----------+----------+-------------------+ SFJ      Full                                                              +---------+---------------+---------+-----------+----------+-------------------+ FV Prox  Full                                                             +---------+---------------+---------+-----------+----------+-------------------+ FV Mid                                                Not well visualized +---------+---------------+---------+-----------+----------+-------------------+ FV DistalFull                                                             +---------+---------------+---------+-----------+----------+-------------------+  PFV      Full                                                             +---------+---------------+---------+-----------+----------+-------------------+ POP      Full           Yes      Yes                                      +---------+---------------+---------+-----------+----------+-------------------+ PTV      Full                                                             +---------+---------------+---------+-----------+----------+-------------------+ PERO     Full           Yes      Yes                                      +---------+---------------+---------+-----------+----------+-------------------+   +---------+---------------+---------+-----------+----------+--------------+ LEFT     CompressibilityPhasicitySpontaneityPropertiesThrombus Aging +---------+---------------+---------+-----------+----------+--------------+ CFV      Full           Yes      Yes                                 +---------+---------------+---------+-----------+----------+--------------+ SFJ      Full                                                        +---------+---------------+---------+-----------+----------+--------------+ FV Prox  Full                                                        +---------+---------------+---------+-----------+----------+--------------+ FV Mid   Full                                                         +---------+---------------+---------+-----------+----------+--------------+ FV DistalFull                                                        +---------+---------------+---------+-----------+----------+--------------+ PFV      Full                                                        +---------+---------------+---------+-----------+----------+--------------+  POP      Full           Yes      Yes                                 +---------+---------------+---------+-----------+----------+--------------+ PTV      Full                                                        +---------+---------------+---------+-----------+----------+--------------+ PERO     Full                                                        +---------+---------------+---------+-----------+----------+--------------+     Summary: BILATERAL: - No evidence of deep vein thrombosis seen in the lower extremities, bilaterally. - RIGHT: - There is no evidence of deep vein thrombosis in the lower extremity. However, portions of this examination were limited- see technologist comments above.  - No cystic structure found in the popliteal fossa.  LEFT: - There is no evidence of deep vein thrombosis in the lower extremity.  - No cystic structure found in the popliteal fossa.  *See table(s) above for measurements and observations. Electronically signed by Monica Martinez MD on 06/30/2022 at 2:45:16 PM.    Final    Recent Labs    06/29/22 0527 06/30/22 0616  WBC 20.2* 20.4*  HGB 9.5* 9.4*  HCT 28.3* 28.6*  PLT 161 234   Recent Labs    06/29/22 0527 06/30/22 0616  NA 145 146*  K 4.1 4.7  CL 115* 116*  CO2 22 23  GLUCOSE 105* 157*  BUN 64* 61*  CREATININE 4.99* 5.37*  CALCIUM 7.7* 7.9*    Intake/Output Summary (Last 24 hours) at 07/01/2022 0911 Last data filed at 07/01/2022 0855 Gross per 24 hour  Intake 800 ml  Output --  Net 800 ml         Physical Exam: Vital Signs Blood pressure 136/64, pulse 90, temperature 98.6 F (37 C), temperature source Oral, resp. rate 20, height 5\' 5"  (1.651 m), weight 86.2 kg, last menstrual period 06/15/2022, SpO2 100 %.  Constitutional: No distress . Vital signs reviewed. HEENT: NCAT, EOMI, oral membranes moist Neck: supple Cardiovascular: RRR without murmur. No JVD    Respiratory/Chest: CTA Bilaterally without wheezes or rales. Normal effort    GI/Abdomen: BS +, non-tender, non-distended Ext: no clubbing, cyanosis, or edema Psych: pleasant and cooperative  Skin: Large open antero-medial wound on right thigh with 90% granulation tissue with exposed fat and muscle. Similar smaller wound just above in hypogastric area. These areas are stable.  Red areas under breasts--appear to be drying up Neuro:  Alert and oriented x 3. Normal insight and awareness. Intact Memory. Normal language and speech. Cranial nerve exam unremarkable. UE motor 5/5. LE limited by open wound, discomfort. ADF/APF 4/5. No focal sensory findings. Does high frequency tremor, ?asterixis   Musculoskeletal: normal rom where testable    Assessment/Plan: 1. Functional deficits which require 3+ hours per day of interdisciplinary therapy in a comprehensive inpatient rehab setting. Physiatrist is providing close team supervision and 24  hour management of active medical problems listed below. Physiatrist and rehab team continue to assess barriers to discharge/monitor patient progress toward functional and medical goals  Care Tool:  Bathing    Body parts bathed by patient: Left arm, Right arm, Chest, Abdomen, Front perineal area, Face   Body parts bathed by helper: Right lower leg, Left lower leg, Left upper leg, Right upper leg, Buttocks     Bathing assist Assist Level: Moderate Assistance - Patient 50 - 74%     Upper Body Dressing/Undressing Upper body dressing   What is the patient wearing?: Pull over shirt, Bra    Upper  body assist Assist Level: Supervision/Verbal cueing    Lower Body Dressing/Undressing Lower body dressing      What is the patient wearing?: Pants, Underwear/pull up     Lower body assist Assist for lower body dressing: Maximal Assistance - Patient 25 - 49%     Toileting Toileting    Toileting assist Assist for toileting: Moderate Assistance - Patient 50 - 74%     Transfers Chair/bed transfer  Transfers assist     Chair/bed transfer assist level: Minimal Assistance - Patient > 75%     Locomotion Ambulation   Ambulation assist      Assist level: Total Assistance - Patient < 25% (due to buckling of RLE) Assistive device: Walker-rolling Max distance: 150'   Walk 10 feet activity   Assist     Assist level: Contact Guard/Touching assist Assistive device: Walker-rolling   Walk 50 feet activity   Assist    Assist level: Total Assistance - Patient < 25% Assistive device: Walker-rolling    Walk 150 feet activity   Assist    Assist level: Total Assistance - Patient < 25% Assistive device: Walker-rolling    Walk 10 feet on uneven surface  activity   Assist     Assist level: Total Assistance - Patient < 25% Assistive device: Walker-rolling   Wheelchair     Assist Is the patient using a wheelchair?: No             Wheelchair 50 feet with 2 turns activity    Assist            Wheelchair 150 feet activity     Assist          Blood pressure 136/64, pulse 90, temperature 98.6 F (37 C), temperature source Oral, resp. rate 20, height 5\' 5"  (1.651 m), weight 86.2 kg, last menstrual period 06/15/2022, SpO2 100 %.  Medical Problem List and Plan: 1. Functional deficits secondary to right iliopsoas abscess/right intrapelvic necrotizing fasciitis/right thigh necrotizing fasciitis.  Status post right thigh I&D as well as right pelvic I&D 06/18/2022             -patient may shower             -ELOS/Goals: 5-7 days modI              -Continue CIR therapies including PT, OT   2.  Antithrombotics: -DVT/anticoagulation:  Pharmaceutical: Heparin check vascular study             -antiplatelet therapy: N/A 3. Pain: continue Neurontin 100 mg 3 times daily--changed to 300mg  qhs, hydrocodone as needed 4. Depression: continue Zoloft 100 mg nightly.  Provide emotional support             -antipsychotic agents: N/A 5. Neuropsych/cognition: This patient is capable of making decisions on her own behalf. 6. Skin/Wound Care: Routine skin checks  -  right thigh and inguinal wounds: -wet to dry packing of open stage iii wounds.  -pt s/p I&D's by surgery--no plan for further debridements -spoke to surgery this morning. Will pursue vac rx--consulted WOC RN  -silver dressing to breasts. Keep area dry.  7. Fluids/Electrolytes/Nutrition: Routine in and outs with follow-up chemistries   -labs with HD per nephrology 8.  Normocytic anemia.  Follow-up CBC.  Continue Aranesp 9.  AKI on CKD stage IV.  AKI likely due to ischemic ATN in the setting of sepsis physiology.  Follow-up renal services 10.  Diabetes mellitus.  Hemoglobin A1c 5.4.  SSI  -fair control at present 11.  Oral candidiasis.  Completing course of Diflucan 12.  Hypertension.  Norvasc 10 mg daily.    -12/15 controlled 13.  Morbid obesity.  BMI 33.57. Follow-up dietary 14. Asterixis? Advised pt to review with nephrology when they see here. I don't see any obvious pharmaceutical culprits. Gabapentin is dosed appropriately---could be given just a one time however.    LOS: 2 days A FACE TO FACE EVALUATION WAS PERFORMED  Meredith Staggers 07/01/2022, 9:11 AM

## 2022-07-01 NOTE — Progress Notes (Signed)
Physical Therapy Session Note  Patient Details  Name: Jacqueline [name] MRN: [id number] Date of Birth: [birth year]  Today's Date: 07/01/2022 PT Individual Time: 1004-1059 and 3335-4562 PT Individual Time Calculation (min): 55 min and 74 min  Short Term Goals: Week 1:  PT Short Term Goal 1 (Week 1): STGs = LTGs  Skilled Therapeutic Interventions/Progress Updates:     Pt received seated in WC and agrees to therapy. Reports pain is "not bad" in R lower extremity. PT provides rest breaks as needed to manage pain. Pt self propels WC x300' to gym with bilateral upper extremities for endurance training. Pt stands with RW and cues for hand placement. Pt ambulates x175' with RW and CGA, with cues for upright gaze to improve posture and balance, and cues for optimal positioning within RW for body mechanics and safety. During rest break, PT explains 6 Minute Walk Test. Pt then completes 6MWT with RW, ambulating 491'. Seated rest break.   Pt performs sit to stand transfers, pushing off mat with bilateral upper extremities and when sitting back down, cued to place hands on distal thighs for support (pt was unable to perform sit to stand when pushing off thighs, so cued to use mat). PT provides cues to use power to stand up quickly and then to eccentrically control stand to sit slowly for increased strengthening. Pt completes 5x5 with seated rest breaks.   Pt self propels WC x200' back toward room with bilateral upper extremities, for endurance training. Stand step back to bed with RW and cues for positioning. Left supine with all needs within reach.  2nd Session: Pt received supine in bed and agrees to therapy. Pt had just gotten wound vac placed on R inner thigh wound and reports significant pain. RN notified and provides pt with pain meds during session and PT provides rest breaks as needed to manage pain. Pt performs bed mobility with miNA to manage R lower extremity. Sit to stand and stand step to [facility] with RW  and CGA with cues for positioning. WC transport to gym for time management. Stand step to mat with RW and same cues. MinA for sit to supine with management of R lower extremity. Pt lays semi reclined on wedge for comfort, as having R hip in neutral extension in supine is uncomfortable for pt. Pt completes supine therex for strengthening and ROM of R lower extremity, including 1x15: Ankles pumps, quad sets, glute sets, SAQ and SLRs (AAROM), hip abduction, and heel slides. Pt noted to have improved activation of R lower extremity muscle groups, especially hip flexors and extensors. Pt then performs 3x10 bilateral lower extremity bridges with 10 count hold on final rep of each set. PT provides tactile cueing at R distal quad for improved contraction and stability. Pt then completes 3x10 R lower extremity single leg bridges with 10 count hold on final rep.   Pt ambulates x450' with RW and cues for depression and downward rotation of scapulae for improved body mechanics. WC transport back to room. Stand step back to bed with CGA. Left supine with alarm intact and all needs within reach.  Therapy Documentation Precautions:  Precautions Precautions: Fall Precaution Comments: RLE buckling - watch BP Restrictions Weight Bearing Restrictions: Yes RLE Weight Bearing: Weight bearing as tolerated   Therapy/Group: Individual Therapy  Breck Coons, PT, DPT 07/01/2022, 4:45 PM

## 2022-07-01 NOTE — Consult Note (Signed)
Neuropsychological Consultation   Patient:   Jacqueline [name]   DOB:   1981-01-28  MR Number:  [id number]  Location:  [facility] [address] C[facility] [address] [place] [id number] [place] [address] Dept: [place]: [phone]           Date of Service:   07/01/2022  Start Time:   9 AM End Time:   10 AM  Provider/Observer:  Ilean Skill, Psy.D.       Clinical Neuropsychologist       Billing Code/Service: [phone]  Reason for Service:    Jacqueline [name] is a 41 year old female referred for neuropsychological/psychological consultation for coping and adjustment issues with significant medical illness acutely related to interventions for necrotizing fasciitis.  Patient has a past medical history including hypertension, progressive chronic kidney disease, morbid obesity with gastric bypass 2020, type 2 diabetes.  Patient presented on 06/18/2022 with right groin and leg pain over the prior 2 days.  Low-grade fever was noted and bouts of nausea/vomiting.  Patient diagnosed with necrotizing fasciitis in the right thigh along the right pelvic sidewall.  Patient underwent right thigh irrigation debridement as well as right pelvic irrigation debridement on 06/18/2022.  Patient also returned back to the OR on 06/20/2022 for wound exploration and further care.  Patient maintained on broad-spectrum antibiotics and was extubated postoperatively and taken to PACU.  Once therapy assessments were completed the patient was admitted to the comprehensive inpatient rehabilitation services due to functional deficits particularly with right lower extremity.   Below is the HPI for the current admission for convenience:  HPI: Jacqueline [name] is a 41 year old right-handed female with history of hypertension, progressive CKD followed by [facility], morbid obesity with gastric bypass 2020, type 2 diabetes mellitus.  Per chart review lives with spouse and  children.  Independent prior to admission.  Presented 06/18/2022 with right groin and leg pain x 2 days.  Patient developed low-grade fever as well as bouts of nausea vomiting.  Underwent CT abdomen pelvis with contrast concerning for necrotizing fasciitis in the right thigh masslike area measuring 6.8 x 6.0 x 7.8 cm along the right pelvic sidewall centered in the right psoas musculature with extension into the adjacent soft tissues, exerting substantial mass effect, particularly on the distal third of the right ureter with gas present in the deep soft tissue of the anterior compartment of the right thigh along the right iliac [place] musculature.  Noted WBC 27,800 lactic acid 1.9 hemoglobin 5.9 creatinine 5.57.  Patient underwent right thigh irrigation debridement as well as right pelvic irrigation debridement per Dr. Eustace Pen. Kinsinger12/08/2021.  She did return back to the OR [id number] [phone] for necrotizing infection undergoing wound exploration of right thigh and right groin with irrigation debridement and dressing change.  Patient maintained on broad-spectrum antibiotics.  She was extubated postop and taken to the PACU.  Follow-up for nephrology services for AKI on CKD stage IV likely due to ischemic ATN in the setting of sepsis with latest creatinine 4.99.  Acute blood loss anemia 9.5 and monitored.  She was cleared to begin subcutaneous heparin for DVT prophylaxis.  Antibiotic regimen transition to p.o. Augmentin which has been completed and currently receiving intravenous Diflucan x 7 doses for oral candidiasis.  Therapy evaluations completed due to patient decreased functional mobility was admitted for a comprehensive rehab program. She is having difficulty elevating her RLE.   Current Status:  Patient was awake and alert with husband and mother present  when I entered the room and husband and mother left relatively early asking a few questions about expectations going forward.  I addressed questions where appropriate  and recommended discussing some of the questions they had about medical status to her attending.  Patient acknowledged difficulty coping with everything she has gone through an extended hospital stay and worries about implications of her infection but hopeful and encouraged by improvements as of late.  Patient remains motivated to participate in therapeutic interventions.  Patient acknowledges negative mood state at times but denies the level having a negative impact on her capacity to fully engage in therapeutic interventions.  Patient reports that pain is a bigger challenge to her at this point.  Behavioral Observation: Dhyana Stratmann  presents as a 41 y.o.-year-old Right handed African American Female who appeared her stated age. her dress was Appropriate and she was Well Groomed and her manners were Appropriate to the situation.  her participation was indicative of Appropriate and Attentive behaviors.  There were physical disabilities noted.  she displayed an appropriate level of cooperation and motivation.    Interactions:    Active Appropriate  Attention:   within normal limits and attention span and concentration were age appropriate  Memory:   within normal limits; recent and remote memory intact  Visuo-spatial:  not examined  Speech (Volume):  normal  Speech:   normal; normal  Thought Process:  Coherent and Relevant  Though Content:  WNL; not suicidal and not homicidal  Orientation:   person, place, time/date, and situation  Judgment:   Good  Planning:   Fair  Affect:    Appropriate  Mood:    Dysphoric  Insight:   Fair  Intelligence:   normal   Medical History:   Past Medical History:  Diagnosis Date   Anemia    Anxiety    Chronic bronchitis ([place])    Hypertension    Increased frequency of headaches    Morbid obesity ([place])    Sleep apnea    Type II diabetes mellitus ([place])          Patient Active Problem List   Diagnosis Date Noted   Adjustment disorder with  depressed mood 07/01/2022   Debility 06/29/2022   Necrotizing fasciitis ([place]) 06/29/2022   Fasciitis 06/18/2022   Viral illness 08/17/2020   Elevated d-dimer 08/17/2020   Acute lower UTI 08/17/2020   Hypoalbuminemia 24/23/5361   Complicated migraine 44/31/5400   Right hemiplegia ([place]) 12/02/2019   Hypertension    AKI (acute kidney injury) ([place])    CKD (chronic kidney disease) stage 3, GFR 30-59 ml/min    Obesity, Class III, BMI 40-49.9 (morbid obesity) ([place])    Insulin dependent diabetes mellitus    Anemia of chronic disease    HLD (hyperlipidemia) 07/02/2018   Acute on chronic diastolic CHF (congestive heart failure) ([place]) 07/02/2018   Depression 07/02/2018   OSA (obstructive sleep apnea) 07/02/2018   Acute on chronic diastolic (congestive) heart failure ([place]) 07/02/2018   Cellulitis of right lower extremity 07/02/2018   Anemia 07/02/2018   Type 2 diabetes mellitus with hyperglycemia, with long-term current use of insulin ([place]) 07/02/2018   Chest pain 05/04/2018   Hypertensive urgency 05/03/2018   Left facial numbness 05/03/2018   Headache 05/03/2018   Elevated troponin 05/03/2018   Hyperlipidemia associated with type 2 diabetes mellitus ([place]) 03/08/2018   Hypertension associated with stage 3 chronic kidney disease due to type 2 diabetes mellitus ([place]) 03/08/2018   Vitamin D deficiency 12/21/2017  Generalized anxiety disorder 08/07/2017   Diabetic peripheral neuropathy ([place]) 06/21/2017   History of asthma 06/21/2017   Steatosis of liver 06/21/2017   Microalbuminuric diabetic nephropathy ([place]) 06/01/2017   Raised TSH level 06/01/2017   Thrombocytosis 06/01/2017   Morbid obesity with BMI of 40.0-44.9, adult ([place]) 05/30/2017   Allergic rhinitis 05/30/2017   PAF (paroxysmal atrial fibrillation) ([place]) 05/25/2017   Sepsis ([place]) 05/03/2017    Class: Present on Admission   Acute renal failure superimposed on stage 3 chronic kidney disease ([place]) 05/03/2017    Class: Stage 3    Neck pain    Acidosis, metabolic    Hyponatremia    Type II diabetes mellitus with renal manifestations ([place]) 04/30/2017   Normocytic anemia 04/30/2017   Staphylococcus aureus bacteremia 03/15/2016   Streptococcal bacteremia 03/15/2016   Tachycardia 03/14/2016   Fever 03/14/2016   Bad headache 03/14/2016   Morbid obesity ([place])    Diabetes ([place]) 01/01/2014         Psychiatric History:  Patient does have a past history of depression and is continuing with her home medicine sertraline on the unit.  Patient reports that this medicine has been a good match for her and she feels like it is effectively managing her depressive symptomatology.  Patient denies any significant exacerbation of her underlying depression.  Family Med/Psych History:  Family History  Problem Relation Age of Onset   Diabetes Mother    Hypertension Mother    Thyroid disease Mother    Kidney disease Maternal Grandmother    Diabetes Maternal Grandmother    Heart attack Other     Impression/DX:  Kaneisha Ellenberger is a 41 year old female referred for neuropsychological/psychological consultation for coping and adjustment issues with significant medical illness acutely related to interventions for necrotizing fasciitis.  Patient has a past medical history including hypertension, progressive chronic kidney disease, morbid obesity with gastric bypass 2020, type 2 diabetes.  Patient presented on 06/18/2022 with right groin and leg pain over the prior 2 days.  Low-grade fever was noted and bouts of nausea/vomiting.  Patient diagnosed with necrotizing fasciitis in the right thigh along the right pelvic sidewall.  Patient underwent right thigh irrigation debridement as well as right pelvic irrigation debridement on 06/18/2022.  Patient also returned back to the OR on 06/20/2022 for wound exploration and further care.  Patient maintained on broad-spectrum antibiotics and was extubated postoperatively and taken to PACU.  Once therapy  assessments were completed the patient was admitted to the comprehensive inpatient rehabilitation services due to functional deficits particularly with right lower extremity.  Disposition/Plan:  Worked on coping and adjustment issues around complex medical issues including her necrotizing fasciitis treatment as well as progressive chronic kidney disease etc.           Electronically Signed   _______________________ Ilean Skill, Psy.D. Clinical Neuropsychologist

## 2022-07-01 NOTE — IPOC Note (Signed)
Overall Plan of Care [facility]) Patient Details Name: Jacqueline [name] MRN: [id number] DOB: [birth year]  Admitting Diagnosis: Necrotizing fasciitis [facility])  Hospital Problems: Principal Problem:   Necrotizing fasciitis ([place]) Active Problems:   Debility   Adjustment disorder with depressed mood     Functional Problem List: Nursing Motor, Skin Integrity, Bladder, Bowel, Pain, Edema, Perception, Endurance, Safety, Medication Management, Sensory  PT Balance, Endurance, Motor, Safety, Sensory  OT Balance, Motor, Endurance, Pain, Sensory, Edema  SLP    TR         Basic ADL's: OT Bathing, Toileting, Dressing     Advanced  ADL's: OT Simple Meal Preparation     Transfers: PT Bed Mobility, Bed to Chair, Car, Manufacturing systems engineer, Metallurgist: PT Ambulation, Stairs     Additional Impairments: OT None  SLP        TR      Anticipated Outcomes Item Anticipated Outcome  Self Feeding n/a  Swallowing      Basic self-care  supervision/mod I  Toileting  supervision/mod I   Bathroom Transfers Supervision  Bowel/Bladder  continent x 2  Transfers  Supervision  Locomotion  Supervision  Communication     Cognition     Pain  less than 4  Safety/Judgment  remain fall free while in rehab   Therapy Plan: PT Intensity: Minimum of 1-2 x/day ,45 to 90 minutes PT Frequency: 5 out of 7 days PT Duration Estimated Length of Stay: 5-7 Days OT Intensity: Minimum of 1-2 x/day, 45 to 90 minutes OT Frequency: 5 out of 7 days OT Duration/Estimated Length of Stay: 7 days     Team Interventions: Nursing Interventions Patient/Family Education, Disease Management/Prevention, Skin Care/Wound Management, Discharge Planning, Bladder Management, Pain Management, Psychosocial Support, Bowel Management, Medication Management  PT interventions Ambulation/gait training, Community reintegration, DME/adaptive equipment instruction, Neuromuscular re-education, Psychosocial support, Stair  training, UE/LE Strength taining/ROM, Training and development officer, Discharge planning, Functional electrical stimulation, Pain management, Skin care/wound management, Therapeutic Activities, UE/LE Coordination activities, Cognitive remediation/compensation, Functional mobility training, Disease management/prevention, Patient/family education, Splinting/orthotics, Therapeutic Exercise  OT Interventions Training and development officer, Cognitive remediation/compensation, Community reintegration, Discharge planning, Disease mangement/prevention, DME/adaptive equipment instruction, Functional electrical stimulation, Functional mobility training, Neuromuscular re-education, Pain management, Patient/family education, Psychosocial support, Self Care/advanced ADL retraining, Skin care/wound managment, Splinting/orthotics, Therapeutic Activities, Therapeutic Exercise, UE/LE Strength taining/ROM, UE/LE Coordination activities, Visual/perceptual remediation/compensation, Wheelchair propulsion/positioning  SLP Interventions    TR Interventions    SW/CM Interventions Discharge Planning, Psychosocial Support, Patient/Family Education   Barriers to Discharge MD  Medical stability  Nursing Decreased caregiver support, Home environment access/layout 2 level with B/B main level and level entry flight of stairs  PT      OT Wound Care Has family that can hopefully assist as well  SLP      SW       Team Discharge Planning: Destination: PT-Home ,OT- Home , SLP-  Projected Follow-up: PT-Outpatient PT, OT-  Outpatient OT, SLP-  Projected Equipment Needs: PT-To be determined, OT- To be determined, SLP-  Equipment Details: PT- , OT-Likely a 3-on-1 BSC Patient/family involved in discharge planning: PT- Patient, Family Midwife,  OT-Patient, Family member/caregiver, SLP-   MD ELOS: 5-7 days Medical Rehab Prognosis:  Excellent Assessment: The patient has been admitted for CIR therapies with the diagnosis of  necrotizing fasciitis of RLE/inguinal areas. The team will be addressing functional mobility, strength, stamina, balance, safety, adaptive techniques and equipment, self-care, bowel and bladder mgt, patient and caregiver education, pain mgt, wound care/vac  mgt, community reentry. Goals have been set at supervision for mobility and self-care. Anticipated discharge destination is home with family.        See Team Conference Notes for weekly updates to the plan of care

## 2022-07-01 NOTE — Progress Notes (Addendum)
Patient ID: [name], female   DOB: [birth year], 41 y.o.   MRN: [id number]  SW faxed FMLA forms to [employer] [phone]).  *SW aware pt will require wound vac at discharge.   SW met with pt in room to provide FMLA forms faxed, ELOS, and discussed wound vac.   SW sent referral/order form to [facility] for wound vac.  Pt already established with [facility]. SW informed Tommi Rumps on updates, and indicated will follow-up with a discharge date.   Loralee Pacas, MSW, [place] Office: [phone] Cell: [phone] Fax: [phone]

## 2022-07-01 NOTE — Care Management (Signed)
Inpatient Rehabilitation Center Individual Statement of Services  Patient Name:  Jacqueline [name]  Date:  07/01/2022  Welcome to the [facility].  Our goal is to provide you with an individualized program based on your diagnosis and situation, designed to meet your specific needs.  With this comprehensive rehabilitation program, you will be expected to participate in at least 3 hours of rehabilitation therapies Monday-Friday, with modified therapy programming on the weekends.  Your rehabilitation program will include the following services:  Physical Therapy (PT), Occupational Therapy (OT), Speech Therapy (ST), 24 hour per day rehabilitation nursing, Therapeutic Recreaction (TR), Psychology, Neuropsychology, Care Coordinator, Rehabilitation Medicine, [place], and Other  Weekly team conferences will be held on Tuesdays to discuss your progress.  Your Inpatient Rehabilitation Care Coordinator will talk with you frequently to get your input and to update you on team discussions.  Team conferences with you and your family in attendance may also be held.  Expected length of stay: 5-7 days  Overall anticipated outcome: Supervision  Depending on your progress and recovery, your program may change. Your Inpatient Rehabilitation Care Coordinator will coordinate services and will keep you informed of any changes. Your Inpatient Rehabilitation Care Coordinator's name and contact numbers are listed  below.  The following services may also be recommended but are not provided by the [facility] will be made to provide these services after discharge if needed.  Arrangements include referral to agencies that provide these services.  Your insurance has been verified to be:  [place]  Your primary doctor is:   O[facility]  Pertinent information will be shared with your doctor and your insurance company.  Inpatient Rehabilitation Care Coordinator:  Cathleen Corti [phone] or ([phone]  Information discussed with and copy given to patient by: Rana Snare, 07/01/2022, 10:27 AM

## 2022-07-01 NOTE — Consult Note (Signed)
WOC Nurse Consult Note: Reason for Consult:Placement of NPWT to right thigh and right inguinal wounds Wound type:Full thickness  Pressure Injury POA: N/A Measurement: Right thigh:  21cm x 9cm x 4cm with undermining at 4 o'clock measuring 5cm Right inguinal area: 3cm x 8cm x 3cm with undermining at 2.5cm at 5 o'clock Wound bed: red, moist Drainage (amount, consistency, odor) moderate to large lymphatic serous, drainage Periwound: mild maceration, irritant contact dermatitis Dressing procedure/placement/frequency: Wounds cleansed with NS, dried. Skin barrier rings used to encircle wounds to enhance seal. White foam (2 pieces) placed into right thigh wound, topped with two pieces of black foam. Two pieces of black foam used to fill right inguinal wound. Drape applied, dressings attached to 178mmHg continuous negative pressure using a "Y" connector. AN immediate seal is achieved. Patient tolerated procedure well.  Next dressing change is due on MONDAY, 07/04/22. Supplies are ordered: 4 Large dressing kits, 1 cannister, 6 skin barrier rings and 4 white foam dressing kits. Dressing changes by [facility] Team are M/W/F.  [facility] nursing team will follow, and will remain available to this patient, the nursing and medical teams.  Thank you for inviting [place] to participate in this patient's Plan of Care.  Maudie Flakes, MSN, RN, CNS, [place], Serita Grammes, [employer], [employer] phone:  [phone]

## 2022-07-02 DIAGNOSIS — E1122 Type 2 diabetes mellitus with diabetic chronic kidney disease: Secondary | ICD-10-CM

## 2022-07-02 DIAGNOSIS — S71001S Unspecified open wound, right hip, sequela: Secondary | ICD-10-CM

## 2022-07-02 DIAGNOSIS — S71101S Unspecified open wound, right thigh, sequela: Secondary | ICD-10-CM

## 2022-07-02 LAB — GLUCOSE, CAPILLARY
Glucose-Capillary: 90 mg/dL (ref 70–99)
Glucose-Capillary: 185 mg/dL — ABNORMAL HIGH (ref 70–99)
Glucose-Capillary: 172 mg/dL — ABNORMAL HIGH (ref 70–99)

## 2022-07-02 NOTE — Progress Notes (Addendum)
PROGRESS NOTE   Subjective/Complaints: No new complaints or concerns today.   ROS: Patient denies fever, rash, sore throat, blurred vision, dizziness, nausea, vomiting, abdominal pain, diarrhea, cough, shortness of breath or chest pain,   headache, or mood change. .    Objective:   No results found. Recent Labs    06/30/22 0616  WBC 20.4*  HGB 9.4*  HCT 28.6*  PLT 234    Recent Labs    06/30/22 0616  NA 146*  K 4.7  CL 116*  CO2 23  GLUCOSE 157*  BUN 61*  CREATININE 5.37*  CALCIUM 7.9*     Intake/Output Summary (Last 24 hours) at 07/02/2022 2106 Last data filed at 07/02/2022 1621 Gross per 24 hour  Intake 356 ml  Output 775 ml  Net -419 ml         Physical Exam: Vital Signs Blood pressure (!) 151/80, pulse (!) 101, temperature 99.5 F (37.5 C), temperature source Oral, resp. rate 15, height 5\' 5"  (1.651 m), weight 86.2 kg, last menstrual period 06/15/2022, SpO2 94 %.  Constitutional: No distress . Vital signs reviewed. HEENT: NCAT, EOMI, oral membranes moist Neck: supple Cardiovascular: RRR without murmur. No JVD    Respiratory/Chest: CTA Bilaterally without wheezes or rales. Normal effort    GI/Abdomen: BS +, non-tender, non-distended Ext: no clubbing, cyanosis, or edema Psych: pleasant and cooperative  Skin: Wound vac to right thigh- canister was recently replaced .  Red areas under breasts--appear to be drying up Neuro:  Alert and oriented x 3. Normal insight and awareness. Intact Memory. Normal language and speech. Cranial nerve exam unremarkable. UE motor 5/5. LE limited by open wound, discomfort. ADF/APF 4/5. No focal sensory findings. Does high frequency tremor, ?asterixis   Musculoskeletal: normal rom where testable    Assessment/Plan: 1. Functional deficits which require 3+ hours per day of interdisciplinary therapy in a comprehensive inpatient rehab setting. Physiatrist is providing  close team supervision and 24 hour management of active medical problems listed below. Physiatrist and rehab team continue to assess barriers to discharge/monitor patient progress toward functional and medical goals  Care Tool:  Bathing    Body parts bathed by patient: Left arm, Right arm, Chest, Abdomen, Front perineal area, Face   Body parts bathed by helper: Right lower leg, Left lower leg, Left upper leg, Right upper leg, Buttocks     Bathing assist Assist Level: Moderate Assistance - Patient 50 - 74%     Upper Body Dressing/Undressing Upper body dressing   What is the patient wearing?: Pull over shirt, Bra    Upper body assist Assist Level: Supervision/Verbal cueing    Lower Body Dressing/Undressing Lower body dressing      What is the patient wearing?: Pants, Underwear/pull up     Lower body assist Assist for lower body dressing: Maximal Assistance - Patient 25 - 49%     Toileting Toileting    Toileting assist Assist for toileting: Moderate Assistance - Patient 50 - 74%     Transfers Chair/bed transfer  Transfers assist     Chair/bed transfer assist level: Minimal Assistance - Patient > 75%     Locomotion Ambulation   Ambulation assist  Assist level: Total Assistance - Patient < 25% (due to buckling of RLE) Assistive device: Walker-rolling Max distance: 150'   Walk 10 feet activity   Assist     Assist level: Contact Guard/Touching assist Assistive device: Walker-rolling   Walk 50 feet activity   Assist    Assist level: Total Assistance - Patient < 25% Assistive device: Walker-rolling    Walk 150 feet activity   Assist    Assist level: Total Assistance - Patient < 25% Assistive device: Walker-rolling    Walk 10 feet on uneven surface  activity   Assist     Assist level: Total Assistance - Patient < 25% Assistive device: Walker-rolling   Wheelchair     Assist Is the patient using a wheelchair?: No              Wheelchair 50 feet with 2 turns activity    Assist            Wheelchair 150 feet activity     Assist          Blood pressure (!) 151/80, pulse (!) 101, temperature 99.5 F (37.5 C), temperature source Oral, resp. rate 15, height 5\' 5"  (1.651 m), weight 86.2 kg, last menstrual period 06/15/2022, SpO2 94 %.  Medical Problem List and Plan: 1. Functional deficits secondary to right iliopsoas abscess/right intrapelvic necrotizing fasciitis/right thigh necrotizing fasciitis.  Status post right thigh I&D as well as right pelvic I&D 06/18/2022             -patient may shower             -ELOS/Goals: 5-7 days modI             -Continue CIR therapies including PT, OT    -Seen by neuropsych for coping and adjustment- appreciate assistance 2.  Antithrombotics: -DVT/anticoagulation:  Pharmaceutical: Heparin check vascular study             -antiplatelet therapy: N/A 3. Pain: continue Neurontin 100 mg 3 times daily--changed to 300mg  qhs, hydrocodone as needed 4. Depression: continue Zoloft 100 mg nightly.  Provide emotional support             -antipsychotic agents: N/A 5. Neuropsych/cognition: This patient is capable of making decisions on her own behalf. 6. Skin/Wound Care: Routine skin checks  -right thigh and inguinal wounds: -wet to dry packing of open stage iii wounds.  -pt s/p I&D's by surgery--no plan for further debridements -spoke to surgery this morning. Will pursue vac rx--consulted WOC RN  -silver dressing to breasts. Keep area dry.   -12/16 Wound vac in place, continue to monitor , next dressing change 12/18 7. Fluids/Electrolytes/Nutrition: Routine in and outs with follow-up chemistries   -labs with HD per nephrology 8.  Normocytic anemia.  Follow-up CBC.  Continue Aranesp 9.  AKI on CKD stage IV.  AKI likely due to ischemic ATN in the setting of sepsis physiology.  Follow-up renal services  -recheck bmp monday 10.  Diabetes mellitus.  Hemoglobin A1c 5.4.   SSI  -12/16 fair control, monitor trend 11.  Oral candidiasis.  Completing course of Diflucan 12.  Hypertension.  Norvasc 10 mg daily.    -12/15 controlled  -12/16 fair control, continue to monitor, attempt to avoid hypotension 13.  Morbid obesity.  BMI 33.57. Follow-up dietary 14. Asterixis? Advised pt to review with nephrology when they see here. I don't see any obvious pharmaceutical culprits. Gabapentin is dosed appropriately---could be given just a one time however.  LOS: 3 days A FACE TO FACE EVALUATION WAS PERFORMED  Jennye Boroughs 07/02/2022, 9:06 PM

## 2022-07-02 NOTE — Progress Notes (Signed)
Occupational Therapy Session Note  Patient Details  Name: Jacqueline [name] MRN: [id number] Date of Birth: [birth year]  {CHL IP REHAB OT TIME CALCULATIONS:304400400}   Short Term Goals: {OT MLJ:4492010}  Skilled Therapeutic Interventions/Progress Updates:     Pt received in *** with *** out of 10 pain in ***. *** provided for pain relief  ADL: Pt completes ADL at overall *** Level. Skilled interventions include:  Neuromuscular Reeducation ****  Therapeutic exercise ***   Therapeutic activity ***  Pt left at end of session in *** with exit alarm on, call light in reach and all needs met   Therapy Documentation Precautions:  Precautions Precautions: Fall Precaution Comments: RLE buckling - watch BP Restrictions Weight Bearing Restrictions: Yes RLE Weight Bearing: Weight bearing as tolerated General:   Vital Signs: Therapy Vitals Temp: 99.2 F (37.3 C) Temp Source: Oral Pulse Rate: 97 Resp: 17 BP: (!) 154/73 Patient Position (if appropriate): Lying Oxygen Therapy SpO2: 100 % O2 Device: Room Air Pain:   ADL: ADL Eating: Independent Grooming: Independent Upper Body Bathing: Setup, Supervision/safety Lower Body Bathing: Maximal assistance Upper Body Dressing: Setup, Supervision/safety Lower Body Dressing: Maximal assistance Toileting: Minimal assistance Toilet Transfer: Minimal assistance Vision   Perception    Praxis   Balance   Exercises:   Other Treatments:     Therapy/Group: {Therapy/Group:[id number]}  Tonny Branch 07/02/2022, 5:38 PM

## 2022-07-03 LAB — GLUCOSE, CAPILLARY
Glucose-Capillary: 112 mg/dL — ABNORMAL HIGH (ref 70–99)
Glucose-Capillary: 139 mg/dL — ABNORMAL HIGH (ref 70–99)
Glucose-Capillary: 131 mg/dL — ABNORMAL HIGH (ref 70–99)
Glucose-Capillary: 145 mg/dL — ABNORMAL HIGH (ref 70–99)

## 2022-07-03 NOTE — Progress Notes (Signed)
Physical Therapy Session Note  Patient Details  Name: Jacqueline [name] MRN: [id number] Date of Birth: Jul 08, 1981  Today's Date: 07/03/2022 PT Individual Time: 1300-1343 PT Individual Time Calculation (min): 43 min   Short Term Goals: Week 1:  PT Short Term Goal 1 (Week 1): STGs = LTGs  Skilled Therapeutic Interventions/Progress Updates:      Therapy Documentation Precautions:  Precautions Precautions: Fall Precaution Comments: RLE buckling - watch BP Restrictions Weight Bearing Restrictions: Yes RLE Weight Bearing: Weight bearing as tolerated  Pt agreeable to PT and declines pain. Pt transported total A to main gym for time management. In parallel bars pt performed following exercises to increase quad and posterior chain activation:   -R TKE 1 x 8   - R blue theraband resisted TKE 3 x 10   -Mini squats 2 x 8  Pt required verbal and visual cueing for technique with exercises. Pt transported to room and left seated edge of bed with spouse present, bed alarm on and all needs within reach.    Therapy/Group: Individual Therapy  Verl Dicker Verl Dicker PT, DPT  07/03/2022, 7:53 AM

## 2022-07-03 NOTE — Progress Notes (Signed)
Physical Therapy Session Note  Patient Details  Name: Jacqueline [name] MRN: [id number] Date of Birth: [birth year]  Today's Date: 07/03/2022 PT Individual Time: 1101-1200 PT Individual Time Calculation (min): 59 min   Short Term Goals: Week 1:  PT Short Term Goal 1 (Week 1): STGs = LTGs  Skilled Therapeutic Interventions/Progress Updates:    Chart reviewed and pt agreeable to therapy. Pt received semi-reclined in bed with no c/o pain. Also of note, pt relayed that a controlled descent to the ground happened in the previous therapy session. Session focused on RLE strength and proprioception during functional movements to promote mobility and confidence. Pt initiated session with series of balance exercises on foam including neutral stance, feet together, neutral stance + eyes closed, and staggered feet for 3 x 30 rounds each. Pt then practiced 2x10 step up/down at small step and required MinA during activity. Pt relayed lack of sensation under R heel and displayed limited ability to press heel into ground to flatten foot before standing. Pt then briefly practiced pressing R heel into towel and was instructed to continue practicing. At end of session, pt was left seated in [facility] with alarm engaged, nurse call bell and all needs in reach.     Therapy Documentation Precautions:  Precautions Precautions: Fall Precaution Comments: RLE buckling - watch BP Restrictions Weight Bearing Restrictions: Yes RLE Weight Bearing: Weight bearing as tolerated    Therapy/Group: Individual Therapy  Marquette Old, PT, DPT 07/03/2022, 12:27 PM

## 2022-07-04 LAB — CBC
HCT: 27.4 % — ABNORMAL LOW (ref 36.0–46.0)
Hemoglobin: 8.9 g/dL — ABNORMAL LOW (ref 12.0–15.0)
MCH: 30.4 pg (ref 26.0–34.0)
MCHC: 32.5 g/dL (ref 30.0–36.0)
MCV: 93.5 fL (ref 80.0–100.0)
Platelets: 302 10*3/uL (ref 150–400)
RBC: 2.93 MIL/uL — ABNORMAL LOW (ref 3.87–5.11)
RDW: 15.8 % — ABNORMAL HIGH (ref 11.5–15.5)
WBC: 10.5 10*3/uL (ref 4.0–10.5)
nRBC: 0.3 % — ABNORMAL HIGH (ref 0.0–0.2)

## 2022-07-04 LAB — BASIC METABOLIC PANEL
Anion gap: 4 — ABNORMAL LOW (ref 5–15)
BUN: 64 mg/dL — ABNORMAL HIGH (ref 6–20)
CO2: 25 mmol/L (ref 22–32)
Calcium: 7.6 mg/dL — ABNORMAL LOW (ref 8.9–10.3)
Chloride: 113 mmol/L — ABNORMAL HIGH (ref 98–111)
Creatinine, Ser: 4.81 mg/dL — ABNORMAL HIGH (ref 0.44–1.00)
GFR, Estimated: 11 mL/min — ABNORMAL LOW (ref >60)
Glucose, Bld: 137 mg/dL — ABNORMAL HIGH (ref 70–99)
Potassium: 6.1 mmol/L — ABNORMAL HIGH (ref 3.5–5.1)
Sodium: 142 mmol/L (ref 135–145)

## 2022-07-04 LAB — POTASSIUM: Potassium: 6 mmol/L — ABNORMAL HIGH (ref 3.5–5.1)

## 2022-07-04 LAB — GLUCOSE, CAPILLARY
Glucose-Capillary: 140 mg/dL — ABNORMAL HIGH (ref 70–99)
Glucose-Capillary: 109 mg/dL — ABNORMAL HIGH (ref 70–99)
Glucose-Capillary: 146 mg/dL — ABNORMAL HIGH (ref 70–99)
Glucose-Capillary: 158 mg/dL — ABNORMAL HIGH (ref 70–99)

## 2022-07-04 MED ORDER — SODIUM ZIRCONIUM CYCLOSILICATE 10 G PO PACK
10.0000 g | PACK | Freq: Two times a day (BID) | ORAL | Status: DC
  Administered 2022-07-04 – 2022-07-05 (×2): 10 g via ORAL
  Filled 2022-07-04 (×2): qty 1

## 2022-07-04 MED ORDER — NEPRO/CARBSTEADY PO LIQD
237.0000 mL | Freq: Two times a day (BID) | ORAL | Status: DC
  Administered 2022-07-04 – 2022-07-07 (×7): 237 mL via ORAL

## 2022-07-04 MED ORDER — SODIUM ZIRCONIUM CYCLOSILICATE 10 G PO PACK
10.0000 g | PACK | Freq: Once | ORAL | Status: AC
  Administered 2022-07-04: 10 g via ORAL
  Filled 2022-07-04: qty 1

## 2022-07-04 NOTE — Progress Notes (Signed)
**  Late Entry**   07/03/22 1005  What Happened  Was fall witnessed? Yes  Who witnessed fall? Stephane OT  Patients activity before fall ambulating-assisted  Point of contact buttocks  Was patient injured? No  Provider Notification  Provider Name/Title Anders Simmonds  Date Provider Notified 07/03/22  Time Provider Notified 1000  Method of Notification Face-to-face  Notification Reason Fall  Provider response In department  Date of Provider Response 07/03/22  Time of Provider Response 1015  Adult Fall Risk Assessment  Patient Fall Risk Level High fall risk  Adult Fall Risk Interventions  Required Bundle Interventions *See Row Information* High fall risk - low, moderate, and high requirements implemented  Additional Interventions Use of appropriate toileting equipment (bedpan, BSC, etc.);PT/OT need assessed if change in mobility from baseline  Screening for Fall Injury Risk (To be completed on HIGH fall risk patients) - Assessing Need for Floor Mats  Risk For Fall Injury- Criteria for Floor Mats None identified - No additional interventions needed  Pain Assessment  Pain Scale 0-10  Pain Score 0 (Per OT)  Neurological  Level of Consciousness Alert  Musculoskeletal  Assistive Device Front wheel walker;Wheelchair  RLE Weight Bearing WBAT

## 2022-07-04 NOTE — Consult Note (Signed)
[place] Nurse wound follow up Wound type: open wounds to right inguinal fold and right thigh. NPWT (VAC) dressings with black and white foam (thigh) Wound OXB:DZHGD red. Moderate lymphatic drainage from right thigh wound. Canister over half full, new canister in room. Will wait to switch out since output is so high. Undermining present to both.   Drainage (amount, consistency, odor) moderate serous effluent Periwound: Epidermal slippage along C Section scar, this is protected with barrier ring today as well as the periwound Dressing procedure/placement/frequency: Cleansed with NS.  1 piece white foam to thigh.  Black foam to inguinal and thigh wound to fill in defect.   Y connector for 2 separate TRAC pads.  Covered with drape.  Seal achieved at 125 mmHg.  Change MOn.Wed.Fri. Supplies in room.  Will follow.  Estrellita Ludwig MSN, RN, FNP-BC CWON Wound, Ostomy, Continence Nurse [facility] [phone] Pager [phone]

## 2022-07-04 NOTE — Progress Notes (Signed)
Occupational Therapy Session Note  Patient Details  Name: Jacqueline [name] MRN: [id number] Date of Birth: [birth year]  Today's Date: 07/04/2022 OT Individual Time: 1829-9371 session 1 OT Individual Time Calculation (min): 28 min  Session 2: 6967-8938   Short Term Goals: Week 1:  OT Short Term Goal 1 (Week 1): LTG=STG 2/2 ELOS  Skilled Therapeutic Interventions/Progress Updates:  Session 1: Pt greeted supine in bed, pt agreeable to OT intervention. Session focus on BADL reeducation, functional mobility, dynamic standing balance and decreasing overall caregiver burden.          Pt completed supine>sit with MIN A to maneuver RLE to EOB, pt completed dressing from EOB with set- up for UB dressing, and MIN A for LB dressing with pt needing assist to manage wound vac. Pt completed sit>stands with CGA with Rw and MIN A to pivot to w/c with Rw. Pt transported to sink with total A where pt able to stand for oral care with supervision.   MD enter during session with MD reporting pt was now on low intensity therapy for today d/t low potassium.  Therefore remainder of session focused on education related to LB AE with education and demo provided of all LBAE, pt able to return demo using sock aid with MIN cues for sequencing novel task.   Ended session with pt seated in w/c with all needs within reach and chair alarm activated.                   Session 2: Pt greeted supine in bed, pt agreeable to OT intervention. Pt completed supine>sit with CGA with pt maneuvering RLE to EOB with use of BUEs. Pt completed stand pivot to w/c with RW and CGA.  Session focused on low intensity therapeutic activities to challenge dynamic balance and facilitate improved LB strength/endurance.  Pt completed x10 standing squats on airex with BUE support with CGA to increase quad strength for higher level ADLs.  Pt also complete dynamic standing balance task on airex cushion with pt instructed to replicate leggo structure from  visual aid provided to challenge balance. Pt completed task with unilateral support on RW with CGA. Pt did have difficulty with depth perception during task with pt noted to misplace various leggos needing MIN cues to self correct. Pt able to stand for 3 mins on cushion to complete task before needing to sit, no LOB during either task.   Issued pt AE handout to reinforce education previous stated in AM session.             Ended session with pt supine in bed with all needs within reach.           Therapy Documentation Precautions:  Precautions Precautions: Fall Precaution Comments: RLE buckling - watch BP Restrictions Weight Bearing Restrictions: Yes RLE Weight Bearing: Weight bearing as tolerated  Pain: Session 1: no pain  Session 2: unrated pain in R quad d/t dressing change, rest breaks provided as needed.    Therapy/Group: Individual Therapy  Precious Haws 07/04/2022, 12:11 PM

## 2022-07-04 NOTE — Progress Notes (Addendum)
Patient ID: Jacqueline [name], female   DOB: 10/08/1980, 41 y.o.   MRN: [id number]  SW sent HHPT/OT/SN (wound vac) order to [facility].   SW waiting on updates from [facility] about wound vac status.  *insurance approved; waiting for wound vac to be released. Wound vac released, and able to pick up.   Loralee Pacas, MSW, [place] Office: [phone] Cell: [phone] Fax: [phone]

## 2022-07-04 NOTE — Progress Notes (Signed)
Physical Therapy Session Note  Patient Details  Name: Jacqueline [name] MRN: [id number] Date of Birth: [birth year]  Today's Date: 07/04/2022 PT Individual Time: 1093-2355 and 1420-1515 PT Individual Time Calculation (min): 72 min and 55 min  Short Term Goals: Week 1:  PT Short Term Goal 1 (Week 1): STGs = LTGs  Skilled Therapeutic Interventions/Progress Updates:     1st Session: Pt received seated in WC and agrees to therapy. Reports some soreness in R chest wall from "fall" yesterday. Otherwise states pain is "not bad." PT provides rest breaks as needed to manage pain. PT provides education to pt and her husband regarding importance of guarding pt when mobilizing following discharge, as she tends to buckle in R lower extremity witt little to  no warning and has required assistance to prevent falls several times.    WC transport to gym for time management. Pt performs stand step transfer to mat with RW and CGA, with cues for hand placement. Pt completes x10:00 of R lower extremity heel slides while seated in short sitting, with washcloth placed under R foot to decrease friction. Pt noted to have improved strengthen in R knee extensors (~2/5).   PT educates on experimenting with how much weight R lower extremity can support in standing, and importance of managing WB and safety with bilateral upper extremities. Pt then stands with RW and performs slow marching in place with mat behind her in case she has LOB or buckle. Pt starts with weight shifts progressing to lifting alternating legs. PT cues pt to keep "soft knee" on R to engage quads. Pt performs 3 bouts for several minutes each.  Pt performs seated exercises for lower extremities, including 3x15 clamshells with green theraband, and 3x15 self assisted LAQs with green theraband. WC transport back to room. Stand step back to bed with RW and CGA. MinA for management of R lower extremity with sit to supine. Left with alarm intact and all needs within  reach.  2nd Session: Pt received supine in WC and agrees to therapy. Reports pain in R thigh. Number not provided. PT provides rest breaks as needed to manage pain. Supine to sit with cues for sequencing and PT managing wound vac. Pt performs stand step to WC with RW and CGA. WC transport to gym. Stand step to mat table with RW and same assistance. Pt performs standing balance activity to promote increased strengthening and stability of R lower extremity. Pt stands and places L foot on 3.5" step with R lower extremity on ground. Pt gradually releases L upper and then R upper extremity. PT provides minA to faiclitate lateral rotations at waist for weight shifting and dynamic stability.   Pt performs Nustep for endurance training. PT provides cues for hand and foot placement and completing full available ROM. Pt completes x15:00 at workload of 4 with average steps per minute ~60.  Pt self propels WC back to room with bilateral upper extremities for functional endurance training.  Stand step back to bed with RW and CGA. MinA for management of R lower extremity with sit to supine. Left with alarm intact and all needs within reach.  Therapy Documentation Precautions:  Precautions Precautions: Fall Precaution Comments: RLE buckling - watch BP Restrictions Weight Bearing Restrictions: Yes RLE Weight Bearing: Weight bearing as tolerated    Therapy/Group: Individual Therapy  Breck Coons, PT, DPT 07/04/2022, 4:53 PM

## 2022-07-04 NOTE — Progress Notes (Signed)
PROGRESS NOTE   Subjective/Complaints: Elevated K+ today reviewed meds and labs  ROS: Patient denies fever, rash, sore throat, blurred vision, dizziness, nausea, vomiting, abdominal pain, diarrhea, cough, shortness of breath or chest pain,   headache, or mood change. .    Objective:   No results found. Recent Labs    07/04/22 0713  WBC 10.5  HGB 8.9*  HCT 27.4*  PLT 302    Recent Labs    07/04/22 0713  NA 142  K 6.1*  CL 113*  CO2 25  GLUCOSE 137*  BUN 64*  CREATININE 4.81*  CALCIUM 7.6*     Intake/Output Summary (Last 24 hours) at 07/04/2022 0920 Last data filed at 07/03/2022 1843 Gross per 24 hour  Intake 476 ml  Output --  Net 476 ml         Physical Exam: Vital Signs Blood pressure (!) 142/80, pulse 89, temperature 98.7 F (37.1 C), temperature source Oral, resp. rate 18, height 5\' 5"  (1.651 m), weight 86.2 kg, last menstrual period 06/15/2022, SpO2 97 %.   General: No acute distress Mood and affect are appropriate Heart: Regular rate and rhythm no rubs murmurs or extra sounds Lungs: Clear to auscultation, breathing unlabored, no rales or wheezes Abdomen: Positive bowel sounds, soft nontender to palpation, nondistended Extremities: 2+ edema RIght pretib   Skin: Wound vac to right thigh-   Neuro:  Alert and oriented x 3. Normal insight and awareness. Intact Memory. Normal language and speech. Cranial nerve exam unremarkable. UE motor 5/5. RLE limited by open wound, discomfort.3- knee ext  ADF/APF 4/5.  Musculoskeletal:No pain with knee or ankle movement    Assessment/Plan: 1. Functional deficits which require 3+ hours per day of interdisciplinary therapy in a comprehensive inpatient rehab setting. Physiatrist is providing close team supervision and 24 hour management of active medical problems listed below. Physiatrist and rehab team continue to assess barriers to discharge/monitor patient  progress toward functional and medical goals  Care Tool:  Bathing    Body parts bathed by patient: Left arm, Right arm, Chest, Abdomen, Front perineal area, Face   Body parts bathed by helper: Right lower leg, Left lower leg, Left upper leg, Right upper leg, Buttocks     Bathing assist Assist Level: Moderate Assistance - Patient 50 - 74%     Upper Body Dressing/Undressing Upper body dressing   What is the patient wearing?: Pull over shirt, Bra    Upper body assist Assist Level: Supervision/Verbal cueing    Lower Body Dressing/Undressing Lower body dressing      What is the patient wearing?: Pants, Underwear/pull up     Lower body assist Assist for lower body dressing: Maximal Assistance - Patient 25 - 49%     Toileting Toileting    Toileting assist Assist for toileting: Moderate Assistance - Patient 50 - 74%     Transfers Chair/bed transfer  Transfers assist     Chair/bed transfer assist level: Minimal Assistance - Patient > 75%     Locomotion Ambulation   Ambulation assist      Assist level: Total Assistance - Patient < 25% (due to buckling of RLE) Assistive device: Walker-rolling Max distance: 150'  Walk 10 feet activity   Assist     Assist level: Contact Guard/Touching assist Assistive device: Walker-rolling   Walk 50 feet activity   Assist    Assist level: Total Assistance - Patient < 25% Assistive device: Walker-rolling    Walk 150 feet activity   Assist    Assist level: Total Assistance - Patient < 25% Assistive device: Walker-rolling    Walk 10 feet on uneven surface  activity   Assist     Assist level: Total Assistance - Patient < 25% Assistive device: Walker-rolling   Wheelchair     Assist Is the patient using a wheelchair?: No             Wheelchair 50 feet with 2 turns activity    Assist            Wheelchair 150 feet activity     Assist          Blood pressure (!) 142/80,  pulse 89, temperature 98.7 F (37.1 C), temperature source Oral, resp. rate 18, height 5\' 5"  (1.651 m), weight 86.2 kg, last menstrual period 06/15/2022, SpO2 97 %.  Medical Problem List and Plan: 1. Functional deficits secondary to right iliopsoas abscess/right intrapelvic necrotizing fasciitis/right thigh necrotizing fasciitis.  Status post right thigh I&D as well as right pelvic I&D 06/18/2022 Pain related weakness vs RIght lumbar plexopathy              -patient may shower             -ELOS/Goals: 5-7 days modI             -Continue CIR therapies including PT, OT  - limit to low intensity  bedside today due to elevated K+  -Seen by neuropsych for coping and adjustment- appreciate assistance 2.  Antithrombotics: -DVT/anticoagulation:  Pharmaceutical: Heparin check vascular study             -antiplatelet therapy: N/A 3. Pain: continue Neurontin 100 mg 3 times daily--changed to 300mg  qhs, hydrocodone as needed 4. Depression: continue Zoloft 100 mg nightly.  Provide emotional support             -antipsychotic agents: N/A 5. Neuropsych/cognition: This patient is capable of making decisions on her own behalf. 6. Skin/Wound Care: Routine skin checks  -right thigh and inguinal wounds: -wet to dry packing of open stage iii wounds.  -pt s/p I&D's by surgery--no plan for further debridements -spoke to surgery this morning. Will pursue vac rx--consulted WOC RN  -silver dressing to breasts. Keep area dry.   -12/16 Wound vac in place, continue to monitor , next dressing change 12/18 7. Fluids/Electrolytes/Nutrition: Routine in and outs with follow-up chemistries   -labs with HD per nephrology 8.  Normocytic anemia.  Follow-up CBC.  Continue Aranesp 9.  AKI on CKD stage IV.  AKI likely due to ischemic ATN in the setting of sepsis physiology.  Follow-up renal services  HyperK+ 6.1 will give lokelma x 1 recheck in ~6hrs, reconsult nephrology  10.  Diabetes mellitus.  Hemoglobin A1c 5.4.   SSI  -12/16 fair control, monitor trend 11.  Oral candidiasis.  Completing course of Diflucan 12.  Hypertension.  Norvasc 10 mg daily.    -12/15 controlled  -12/16 fair control, continue to monitor, attempt to avoid hypotension 13.  Morbid obesity.  BMI 33.57. Follow-up dietary 14. Asterixis appears improved  Advised pt to review with nephrology when they see here. I don't see any obvious pharmaceutical culprits. Gabapentin is dosed  appropriately---could be given just a one time however.    LOS: 5 days A FACE TO FACE EVALUATION WAS PERFORMED  Charlett Blake 07/04/2022, 9:20 AM

## 2022-07-04 NOTE — Progress Notes (Addendum)
No call received from lab in regards to potassium level, Md is aware/   Yehuda Mao, LPN

## 2022-07-04 NOTE — Progress Notes (Signed)
[facility] Progress Note   Assessment/ Plan:   AoCKD4 in context of #2.  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  Placed on lokelma--> repeat K 6.0, ordered another 10 g.  Changed to renal diet.  Discussed no tomatoes, potatoes (sweet or white) OJ, oranges, grapefruits, bananas, melons.  Suspect this will resolve with dietary modification  Subjective:    Re-consult for this pt- 28F with a PMH sig for HTN, HLD, DM II, and recent admission to [facility] for RLE necrotizing fasciitis.  Had I and D and debridement with ortho and general surgery.  Cultures grew strep anginosus and mixed anaerobic flora.  Was discharged to rehab on 06/30/22 with Augmentin.  Cr was 5.37, K 4.7, and BUN 61 at that time.  Labs checked today and K was 6.1, Cr 4.81.  Given Lokelma. In this setting we are asked to see.    Pt is working with therapies to increase strength and endurance.  She has 2 children, 10 and 7.  Cites her main motivation for getting better as their support.  Mom just left after visiting her.  Has some grapefruit, orange juice, and canteloupe at bedside. Was on a carb modified diet.       Objective:   BP (!) 142/80 (BP Location: Left Arm)   Pulse 89   Temp 98.7 F (37.1 C) (Oral)   Resp 18   Ht _0  (1.651 m)   Wt 86.2 kg   LMP 06/15/2022 (Approximate)   SpO2 97%   BMI 31.62 kg/m   Intake/Output Summary (Last 24 hours) at 07/04/2022 1557 Last data filed at 07/04/2022 1327 Gross per 24 hour  Intake 716 ml  Output --  Net 716 ml   Weight change:   Physical Exam: Gen:NAD, sitting in bed CVS: RRR Resp:  clear Abd: soft Ext: no LE edema, wounds not examined today  Imaging: No results found.  Labs: BMET Recent Labs  Lab 06/28/22 0324 06/29/22 0527 06/30/22 0616 07/04/22 0713 07/04/22 1407  NA 146* 145 146* 142  --   K 4.0 4.1 4.7 6.1* 6.0*  CL 117* 115* 116* 113*  --   CO2 20* _1 --   GLUCOSE 118* 105* 157* 137*  --   BUN 70* 64* 61* 64*  --   CREATININE 5.22* 4.99* 5.37* 4.81*  --   CALCIUM 7.7* 7.7* 7.9* 7.6*  --   PHOS 5.3* 5.3*  --   --   --    CBC Recent Labs  Lab 06/28/22 0324 06/29/22 0527 06/30/22 0616 07/04/22 0713  WBC 20.6* 20.2* 20.4* 10.5  NEUTROABS  --   --  16.9*  --   HGB 10.0* 9.5* 9.4* 8.9*  HCT 29.9* 28.3* 28.6* 27.4*  MCV 89.0 88.2 91.1 93.5  PLT 146* 161 234 302    Medications:     (feeding supplement) PROSource Plus  30 mL Oral BID BM   amLODipine  10 mg Oral Daily   calcitRIOL  0.25 mcg Oral Daily   darbepoetin (ARANESP) injection - NON-DIALYSIS  100 mcg Subcutaneous Q Mon-1800   feeding supplement (NEPRO CARB STEADY)  237  mL Oral BID BM   gabapentin  300 mg Oral QHS   heparin injection (subcutaneous)  5,000 Units Subcutaneous Q8H   insulin aspart  0-5 Units Subcutaneous QHS   nystatin   Topical TID   pantoprazole  40 mg Oral QHS   sertraline  100 mg Oral QHS   sodium bicarbonate  1,300 mg Oral BID   sodium zirconium cyclosilicate  10 g Oral BID    Madelon Lips, MD 07/04/2022, 3:57 PM

## 2022-07-04 NOTE — Progress Notes (Signed)
Attempted to meet with patient. Mother in room. Jacqueline [name] that has been looking through educational binder. Oriented to rehab with team meetings on every Tuesday.  Patient with therapy. Wound vac disconnected. Plugged in to red outlet next to bed and placed cord between bed and bed rail to keep off floor with connector on bed to be hooked back up when returns. No questions at this time.

## 2022-07-05 ENCOUNTER — Inpatient Hospital Stay (HOSPITAL_COMMUNITY): Payer: BC Managed Care – PPO

## 2022-07-05 LAB — RENAL FUNCTION PANEL
Albumin: 1.9 g/dL — ABNORMAL LOW (ref 3.5–5.0)
Albumin: 1.8 g/dL — ABNORMAL LOW (ref 3.5–5.0)
Anion gap: 6 (ref 5–15)
Anion gap: 5 (ref 5–15)
BUN: 67 mg/dL — ABNORMAL HIGH (ref 6–20)
BUN: 67 mg/dL — ABNORMAL HIGH (ref 6–20)
CO2: 23 mmol/L (ref 22–32)
CO2: 24 mmol/L (ref 22–32)
Calcium: 7.7 mg/dL — ABNORMAL LOW (ref 8.9–10.3)
Calcium: 7.5 mg/dL — ABNORMAL LOW (ref 8.9–10.3)
Chloride: 112 mmol/L — ABNORMAL HIGH (ref 98–111)
Chloride: 111 mmol/L (ref 98–111)
Creatinine, Ser: 4.81 mg/dL — ABNORMAL HIGH (ref 0.44–1.00)
Creatinine, Ser: 4.87 mg/dL — ABNORMAL HIGH (ref 0.44–1.00)
GFR, Estimated: 11 mL/min — ABNORMAL LOW (ref >60)
GFR, Estimated: 11 mL/min — ABNORMAL LOW (ref >60)
Glucose, Bld: 123 mg/dL — ABNORMAL HIGH (ref 70–99)
Glucose, Bld: 148 mg/dL — ABNORMAL HIGH (ref 70–99)
Phosphorus: 5.1 mg/dL — ABNORMAL HIGH (ref 2.5–4.6)
Phosphorus: 5.1 mg/dL — ABNORMAL HIGH (ref 2.5–4.6)
Potassium: 6.4 mmol/L (ref 3.5–5.1)
Potassium: 6 mmol/L — ABNORMAL HIGH (ref 3.5–5.1)
Sodium: 141 mmol/L (ref 135–145)
Sodium: 140 mmol/L (ref 135–145)

## 2022-07-05 LAB — GLUCOSE, CAPILLARY
Glucose-Capillary: 101 mg/dL — ABNORMAL HIGH (ref 70–99)
Glucose-Capillary: 120 mg/dL — ABNORMAL HIGH (ref 70–99)
Glucose-Capillary: 138 mg/dL — ABNORMAL HIGH (ref 70–99)
Glucose-Capillary: 164 mg/dL — ABNORMAL HIGH (ref 70–99)

## 2022-07-05 MED ORDER — FUROSEMIDE 10 MG/ML IJ SOLN
40.0000 mg | Freq: Once | INTRAMUSCULAR | Status: AC
  Administered 2022-07-05: 40 mg via INTRAVENOUS
  Filled 2022-07-05: qty 4

## 2022-07-05 MED ORDER — SODIUM ZIRCONIUM CYCLOSILICATE 10 G PO PACK
10.0000 g | PACK | Freq: Three times a day (TID) | ORAL | Status: DC
  Administered 2022-07-05 – 2022-07-08 (×9): 10 g via ORAL
  Filled 2022-07-05 (×10): qty 1

## 2022-07-05 MED ORDER — FUROSEMIDE 40 MG PO TABS
40.0000 mg | ORAL_TABLET | Freq: Every day | ORAL | Status: DC
  Administered 2022-07-06 – 2022-07-08 (×3): 40 mg via ORAL
  Filled 2022-07-05 (×3): qty 1

## 2022-07-05 NOTE — Progress Notes (Signed)
PROGRESS NOTE   Subjective/Complaints: Pt up with OT. Having pain in right thigh wound with vac. Feeling well otherwise.  ROS: Patient denies fever, rash, sore throat, blurred vision, dizziness, nausea, vomiting, diarrhea, cough, shortness of breath or chest pain,  headache, or mood change.   Objective:   No results found. Recent Labs    07/04/22 0713  WBC 10.5  HGB 8.9*  HCT 27.4*  PLT 302   Recent Labs    07/04/22 0713 07/04/22 1407 07/05/22 0941  NA 142  --  141  K 6.1* 6.0* 6.4*  CL 113*  --  112*  CO2 25  --  23  GLUCOSE 137*  --  123*  BUN 64*  --  67*  CREATININE 4.81*  --  4.81*  CALCIUM 7.6*  --  7.7*    Intake/Output Summary (Last 24 hours) at 07/05/2022 1115 Last data filed at 07/04/2022 1835 Gross per 24 hour  Intake 480 ml  Output --  Net 480 ml        Physical Exam: Vital Signs Blood pressure (!) 154/89, pulse 95, temperature 98 F (36.7 C), temperature source Oral, resp. rate 16, height 5\' 5"  (1.651 m), weight 86.2 kg, last menstrual period 06/15/2022, SpO2 100 %.  Constitutional: No distress . Vital signs reviewed. HEENT: NCAT, EOMI, oral membranes moist Neck: supple Cardiovascular: RRR without murmur. No JVD    Respiratory/Chest: CTA Bilaterally without wheezes or rales. Normal effort    GI/Abdomen: BS +, non-tender, non-distended Ext: no clubbing, cyanosis, 1+ LE edema Psych: pleasant and cooperative  Skin: Wound vac to right thigh- and inguinal area. Sealed, serosang discharge in cannister.  Neuro:  Alert and oriented x 3. Normal insight and awareness. Intact Memory. Normal language and speech. Cranial nerve exam unremarkable. UE motor 5/5. RLE limited by open wound, discomfort.3- knee ext  ADF/APF 4/5.  Musculoskeletal: No pain with knee or ankle movement    Assessment/Plan: 1. Functional deficits which require 3+ hours per day of interdisciplinary therapy in a comprehensive  inpatient rehab setting. Physiatrist is providing close team supervision and 24 hour management of active medical problems listed below. Physiatrist and rehab team continue to assess barriers to discharge/monitor patient progress toward functional and medical goals  Care Tool:  Bathing    Body parts bathed by patient: Left arm, Right arm, Chest, Abdomen, Front perineal area, Face   Body parts bathed by helper: Right lower leg, Left lower leg, Left upper leg, Right upper leg, Buttocks     Bathing assist Assist Level: Moderate Assistance - Patient 50 - 74%     Upper Body Dressing/Undressing Upper body dressing   What is the patient wearing?: Pull over shirt, Bra    Upper body assist Assist Level: Set up assist    Lower Body Dressing/Undressing Lower body dressing      What is the patient wearing?: Pants, Underwear/pull up     Lower body assist Assist for lower body dressing: Minimal Assistance - Patient > 75%     Toileting Toileting    Toileting assist Assist for toileting: Moderate Assistance - Patient 50 - 74%     Transfers Chair/bed transfer  Transfers assist  Chair/bed transfer assist level: Minimal Assistance - Patient > 75%     Locomotion Ambulation   Ambulation assist      Assist level: Total Assistance - Patient < 25% (due to buckling of RLE) Assistive device: Walker-rolling Max distance: 150'   Walk 10 feet activity   Assist     Assist level: Contact Guard/Touching assist Assistive device: Walker-rolling   Walk 50 feet activity   Assist    Assist level: Total Assistance - Patient < 25% Assistive device: Walker-rolling    Walk 150 feet activity   Assist    Assist level: Total Assistance - Patient < 25% Assistive device: Walker-rolling    Walk 10 feet on uneven surface  activity   Assist     Assist level: Total Assistance - Patient < 25% Assistive device: Walker-rolling   Wheelchair     Assist Is the patient  using a wheelchair?: No             Wheelchair 50 feet with 2 turns activity    Assist            Wheelchair 150 feet activity     Assist          Blood pressure (!) 154/89, pulse 95, temperature 98 F (36.7 C), temperature source Oral, resp. rate 16, height 5\' 5"  (1.651 m), weight 86.2 kg, last menstrual period 06/15/2022, SpO2 100 %.  Medical Problem List and Plan: 1. Functional deficits secondary to right iliopsoas abscess/right intrapelvic necrotizing fasciitis/right thigh necrotizing fasciitis.  Status post right thigh I&D as well as right pelvic I&D 06/18/2022 Pain related weakness vs RIght lumbar plexopathy              -patient may shower             -ELOS/Goals: 5-7 days modI             -Continue CIR therapies including PT, OT. Interdisciplinary team conference today to discuss goals, barriers to discharge, and dc planning.   2.  Antithrombotics: -DVT/anticoagulation:  Pharmaceutical: Heparin check vascular study             -antiplatelet therapy: N/A 3. Pain: continue Neurontin 300mg  qhs, hydrocodone as needed 4. Depression: continue Zoloft 100 mg nightly.  Provide emotional support             -antipsychotic agents: N/A 5. Neuropsych/cognition: This patient is capable of making decisions on her own behalf. 6. Skin/Wound Care: Routine skin checks  -right thigh and inguinal wounds: - Wound vac in place. Appreciate woc RN help 7. Fluids/Electrolytes/Nutrition: Routine in and outs with follow-up chemistries   -labs with HD per nephrology 8.  Normocytic anemia.  Follow-up CBC.  Continue Aranesp 9.  AKI on CKD stage IV.  AKI likely due to ischemic ATN in the setting of sepsis physiology.  Follow-up renal services  HyperK+ lokelma, mgt per nephrology, renal diet --pt needs to adhere 10.  Diabetes mellitus.  Hemoglobin A1c 5.4.  SSI  -12/19 reasonable control  CBG (last 3)  Recent Labs    07/04/22 1645 07/04/22 2026 07/05/22 0604  GLUCAP 146* 158* 101*     11.  Oral candidiasis.  Completing course of Diflucan 12.  Hypertension.  Norvasc 10 mg daily.    -12/15 controlled  -12/19 borderline 13.  Morbid obesity.  BMI 33.57. Follow-up dietary 14. Asterixis appears improved     LOS: 6 days A FACE TO FACE EVALUATION WAS PERFORMED  Meredith Staggers 07/05/2022, 11:15 AM

## 2022-07-05 NOTE — Progress Notes (Signed)
Ordered STAT IV insertion for current potassium level

## 2022-07-05 NOTE — Patient Care Conference (Signed)
Inpatient RehabilitationTeam Conference and Plan of Care Update Date: 07/05/2022   Time: 10:38 AM   Patient Name: Jacqueline [name]      Medical Record Number: [id number]  Date of Birth: 31-Mar-1981 Sex: Female         Room/Bed: 4M05C/4M05C-01 Payor Info: Payor: [place] / Plan: BCBS COMM PPO / Product Type: *No Product type* /    Admit Date/Time:  06/29/2022  5:49 PM  Primary Diagnosis:  Necrotizing fasciitis [facility])  Hospital Problems: Principal Problem:   Necrotizing fasciitis ([place]) Active Problems:   Debility   Adjustment disorder with depressed mood    Expected Discharge Date: Expected Discharge Date: 07/08/22  Team Members Present: Physician leading conference: Dr. Alger Simons Social Worker Present: Loralee Pacas, [place] Nurse Present: Tacy Learn, RN PT Present: Tereasa Coop, PT OT Present: Cherylynn Ridges, OT PPS Coordinator present : Gunnar Fusi, SLP     Current Status/Progress Goal Weekly Team Focus  Bowel/Bladder   Improvement with bowel and bladder. Able to use toliet for elimination at times other times using bed pan.   Continue to work on maintaining continence with elmination needs.   Continue to assist with tolieting needs QShift and PRN    Swallow/Nutrition/ Hydration               ADL's   Supervision/CGA   Supervision/mod I   pt/family education, R LE strength, dc planning    Mobility   minA bed mobility, CGA transfers, gait typically CGA but can be up to [place] with buckling of RLE   Supervision  strengthening of R lower extremity, ambulation, endurance    Communication                Safety/Cognition/ Behavioral Observations               Pain   Patient reports improvement with pain with change in dressing to negative wound therapy.   Continue to maintain pain goal <3/10.   Assess pain QShift and PRN.    Skin   Currently has ecchymosis to lower left abdomen, rash bilateral under breast skin folds, incision  right groin area & right thigh area; MASD coccyx.   Continue to promote wound healing. Promote skin integrity and continue to prevent risk of infection.  Assess QShift & PRN.      Discharge Planning:  Pt will d/c to home with her husband and children, with support from her mother as well. Wound vac status pending. HHA- [facility] for HHPT/OT/SN (wound vac, MWF).   Team Discussion: Necrotizing fasciitis. Continent B/B. Pain managed with PRN pain medications. Wound vac to RLE changed M/W/F. WBAT.  MASD and Inter-day to skin folds. Fall 12/17. Blood sugars are controlled. Therapies report that right knee buckles. Right quad is very weak but has improved since admission. 1500 fluid restriction.  Patient on target to meet rehab goals: yes, on track for discharge.  *See Care Plan and progress notes for long and short-term goals.   Revisions to Treatment Plan:  Monitor labs  Teaching Needs: Medications, safety, skin/wound care, gait/transfer training, etc.   Current Barriers to Discharge: Decreased caregiver support, Home enviroment access/layout, Wound care, Weight, and Weight bearing restrictions  Possible Resolutions to Barriers: Family education, nursing education, order recommended DME     Medical Summary Current Status: right leg necrotizing fasciits with large wounds. vac started last week--having more pain with it but tolerating. on HD for ESRD  Barriers to Discharge: Medical stability;Complicated Wound  Possible Resolutions to [employer]: wound vac set up, education. pain mgt, daily assessment of labs and pt data   Continued Need for Acute Rehabilitation Level of Care: The patient requires daily medical management by a physician with specialized training in physical medicine and rehabilitation for the following reasons: Direction of a multidisciplinary physical rehabilitation program to maximize functional independence : Yes Medical management of patient stability for  increased activity during participation in an intensive rehabilitation regime.: Yes Analysis of laboratory values and/or radiology reports with any subsequent need for medication adjustment and/or medical intervention. : Yes   I attest that I was present, lead the team conference, and concur with the assessment and plan of the team.   Ernest Pine 07/05/2022, 1:00 PM

## 2022-07-05 NOTE — Progress Notes (Signed)
Patient ID: Jacqueline [name], female   DOB: [birth year], 41 y.o.   MRN: [id number]  SW met with pt in room to provide updates from team conference, and d/c date 12/22. SW discussed DME: 3in1, RW and w/c; SW will order. SW informed pt already established with [facility].  SW ordered DME with [employer] via parachute.   SW updated [facility] on d/c date.   SW picked up wound vac, and delivered wound vac to pt room.   Loralee Pacas, MSW, [place] Office: [phone] Cell: [phone] Fax: [phone]

## 2022-07-05 NOTE — Progress Notes (Signed)
Physical Therapy Session Note  Patient Details  Name: Jacqueline [name] MRN: [id number] Date of Birth: 1980/09/14  Today's Date: 07/05/2022 PT Individual Time: 1105-1200 PT Individual Time Calculation (min): 55 min   Short Term Goals: Week 1:  PT Short Term Goal 1 (Week 1): STGs = LTGs  Skilled Therapeutic Interventions/Progress Updates:     Pt received supine in bed with RN present. Pt reports pain in R inner thigh worse than previous sessions. PT provides rest breaks as needed to manage pain. RN present due to pt continuing to have kyperkalemia and RN attempting to place IV. RN is unable to successfully place IV so IV team called, who arrive and are also unable to place IV. In the meantime, PT has pt perform supine therex to strengthen R lower extremity with emphasis on R quadriceps muscle group.  Pt performs 2x15: Ankle pumps Quad sets Glute sets SAQs SLRs Heel Slides  PT provides pt with green theraband and pt completes 2x15 leg extensions while holding onto theraband with bilateral upper extremities to provide upper and lower body strengthening.  Pt left seated in WC with alarm intact and all needs within reach.  Therapy Documentation Precautions:  Precautions Precautions: Fall Precaution Comments: RLE buckling - watch BP Restrictions Weight Bearing Restrictions: Yes RLE Weight Bearing: Weight bearing as tolerated    Therapy/Group: Individual Therapy  Breck Coons, PT, DPT 07/05/2022, 5:17 PM

## 2022-07-05 NOTE — Progress Notes (Signed)
[facility] Progress Note   Assessment/ Plan:   AoCKD4 in context of #2.  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  Increase Lokelma to 10 gm TID, IV Lasix today with PO Lasix to start tomorrow.  Repeat RFP this PM.  Renal US  Subjective:    K still up today- ordered lokelma TID< renal US, and IV Lasix with PO Lasix to follow    Objective:   BP (!) 143/88 (BP Location: Right Arm)   Pulse (!) 109   Temp 99.4 F (37.4 C) (Oral)   Resp 16   Ht _0  (1.651 m)   Wt 86.2 kg   LMP 06/15/2022 (Approximate)   SpO2 100%   BMI 31.62 kg/m   Intake/Output Summary (Last 24 hours) at 07/05/2022 1552 Last data filed at 07/05/2022 1435 Gross per 24 hour  Intake 596 ml  Output --  Net 596 ml   Weight change:   Physical Exam: Gen:NAD, sitting in bed CVS: RRR Resp: clear Abd: soft Ext: 1+ LE edma bilaterally  Imaging: No results found.  Labs: BMET Recent Labs  Lab 06/29/22 0527 06/30/22 0616 07/04/22 0713 07/04/22 1407 07/05/22 0941  NA 145 146* 142  --  141  K 4.1 4.7 6.1* 6.0* 6.4*  CL 115* 116* 113*  --  112*  CO2 _1 --  23  GLUCOSE 105* 157* 137*  --  123*  BUN 64* 61* 64*  --  67*  CREATININE 4.99* 5.37* 4.81*  --  4.81*  CALCIUM 7.7* 7.9* 7.6*  --  7.7*  PHOS 5.3*  --   --   --  5.1*   CBC Recent Labs  Lab 06/29/22 0527 06/30/22 0616 07/04/22 0713  WBC 20.2* 20.4* 10.5  NEUTROABS  --  16.9*  --   HGB 9.5* 9.4* 8.9*  HCT 28.3* 28.6* 27.4*  MCV 88.2 91.1 93.5  PLT 161 234 302    Medications:     (feeding supplement) PROSource Plus  30 mL Oral BID BM    amLODipine  10 mg Oral Daily   calcitRIOL  0.25 mcg Oral Daily   darbepoetin (ARANESP) injection - NON-DIALYSIS  100 mcg Subcutaneous Q Mon-1800   feeding supplement (NEPRO CARB STEADY)  237 mL Oral BID BM   gabapentin  300 mg Oral QHS   heparin injection (subcutaneous)  5,000 Units Subcutaneous Q8H   insulin aspart  0-5 Units Subcutaneous QHS   nystatin   Topical TID   pantoprazole  40 mg Oral QHS   sertraline  100 mg Oral QHS   sodium bicarbonate  1,300 mg Oral BID   sodium zirconium cyclosilicate  10 g Oral TID    Madelon Lips, MD 07/05/2022, 3:52 PM

## 2022-07-05 NOTE — Progress Notes (Signed)
Occupational Therapy Session Note  Patient Details  Name: Jacqueline [name] MRN: [id number] Date of Birth: 08-24-1980  Today's Date: 07/05/2022 Session 1 OT Individual Time: 9379-0240 OT Individual Time Calculation (min): 70 min   Session 2 OT Individual Time: 9735-3299 OT Individual Time Calculation (min): 68 min    Short Term Goals: Week 1:  OT Short Term Goal 1 (Week 1): LTG=STG 2/2 ELOS  Skilled Therapeutic Interventions/Progress Updates:  Session 1   Pt greeted  semi-reclined in bed. Pt reported higher pain in R LE today 8/10. But had just received pain meds. Pt came to sitting EOB with supervision and cues to try to use R LE muscles to get to EOB and not use her hands. Pt donned pants seated EOB with supervision. UB Dressing with set-up A. Pt ambulated to therapy gym with RW and supervision. Pt completed LB there-ex with focus on quad activation. Seated toe taps to kicks. Then transitioned to standing toe taps and R LE kick using small cones. Pt with improved quad activation to get R LE almost to the top 1/2 of cone and then kick forward. UB there ex 3 sets of 10 chest press, bicep curl, seated row, overhead press using 4 lb weighted rod. LB there-ex with 11 miniutes on NuStep on level 4.  Attempted sit<>stands with only LE's, but pt needed mod A and stated her R leg was very painful and requested not to perform this exercise at this time. Pt took rest break, then ambulated back to room with RW and close supervision. Pt left semi-reclined in bed with husband present and needs met.   Session 2 Pt greeted in bathroom. Pt with successful BM and voided bladder. Peri-care completed with supervision. She then ambulated to there sink to wash hands w/ RW and supervision. Nursing entered to change canister on wound vac as wound vac was full. Case manager brought in new smaller wound vac for home and OT educated on ease of threading through pants and transporting new device. 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 OT reviewed kitchen safety, energy conservation techniques, and maneuvering around a kitchen with a RW. Showed pt the walker tray she could get on [employer] if she wanted to. OT also issued RW bag and educated on uses. Pt's spouse came along for family education. Practcied walk-in shower transfer in simulated home environment using BSC in shower as a seat. Pt completed side stepping with close supervision/CGA. Pt ambulated to therapy gym and OT issued home exercise program for UE's using yellow theraband. Pt completed 10 sets of each exercise. Pt reported need to return to the room and go to the bathroom. Spouse provided CGA for ambulation back to room and into bathroom. Pt left in care of spouse with needs met.    Therapy Documentation Precautions:  Precautions Precautions: Fall Precaution Comments: RLE buckling - watch BP Restrictions Weight Bearing Restrictions: Yes RLE Weight Bearing: Weight bearing as tolerated Pain:  8/10 pain in R LE. Rest and repositioned.  Therapy/Group: Individual Therapy  Valma Cava 07/05/2022, 2:56 PM

## 2022-07-05 NOTE — Discharge Summary (Signed)
Physician Discharge Summary  Patient ID: Jacqueline [name] MRN: [id number] DOB/AGE: July 13, 1981 41 y.o.  Admit date: 06/29/2022 Discharge date: 07/08/2022  Discharge Diagnoses:  Principal Problem:   Necrotizing fasciitis ([place]) Active Problems:   Debility   Adjustment disorder with depressed mood DVT prophylaxis AKI on CKD stage IV/hyperkalemia Diabetes mellitus Oral candidiasis Hypertension Morbid obesity   Discharged Condition: Stable  Significant Diagnostic Studies: US RENAL  Result Date: 07/05/2022 CLINICAL DATA:  Acute renal insufficiency EXAM: RENAL / URINARY TRACT ULTRASOUND COMPLETE COMPARISON:  06/18/2022 FINDINGS: Right Kidney: Renal measurements: 9.2 x 6.1 by 6.6 cm = volume: 194 mL. Increased renal cortical echotexture consistent with medical renal disease. Multiple simple right renal cysts, largest measuring 1.4 cm. No specific imaging follow-up is required. No hydronephrosis or nephrolithiasis. Left Kidney: Renal measurements: 7.7 x 4.2 by 4.5 cm = volume: 75 mL. The apparent atrophy of the left kidney based on these measurements is incongruent with the appearance on recent CT, likely due to suboptimal visualization due to patient body habitus. Increased renal cortical echotexture consistent with medical renal disease. No hydronephrosis or nephrolithiasis. Bladder: Appears normal for degree of bladder distention. Other: None. IMPRESSION: 1. Bilateral increased renal cortical echotexture consistent with medical renal disease. 2. The left kidney measures as atrophic, but is fairly symmetrical in size on recent CT. Evaluation is limited on this exam due to patient body habitus. Electronically Signed   By: Randa Ngo M.D.   On: 07/05/2022 15:55   VAS [place] LOWER EXTREMITY VENOUS (DVT)  Result Date: 06/30/2022  Lower Venous DVT Study Patient Name:  Jacqueline [name]  Date of Exam:   06/30/2022 Medical Rec #: [id number]     Accession #:    [id number] Date of Birth: 1980/12/22     Patient  Gender: F Patient Age:   71 years Exam Location:  [facility] Procedure:      VAS [place] LOWER EXTREMITY VENOUS (DVT) Referring Phys: Lauraine Rinne --------------------------------------------------------------------------------  Indications: Swelling, and Edema.  Limitations: Right thigh bandaging. Comparison Study: 08/17/20 prior Performing Technologist: Archie Patten RVS  Examination Guidelines: A complete evaluation includes B-mode imaging, spectral Doppler, color Doppler, and power Doppler as needed of all accessible portions of each vessel. Bilateral testing is considered an integral part of a complete examination. Limited examinations for reoccurring indications may be performed as noted. The reflux portion of the exam is performed with the patient in reverse Trendelenburg.  +---------+---------------+---------+-----------+----------+-------------------+ RIGHT    CompressibilityPhasicitySpontaneityPropertiesThrombus Aging      +---------+---------------+---------+-----------+----------+-------------------+ CFV      Full           Yes      Yes                                      +---------+---------------+---------+-----------+----------+-------------------+ SFJ      Full                                                             +---------+---------------+---------+-----------+----------+-------------------+ FV Prox  Full                                                             +---------+---------------+---------+-----------+----------+-------------------+  FV Mid                                                Not well visualized +---------+---------------+---------+-----------+----------+-------------------+ FV DistalFull                                                             +---------+---------------+---------+-----------+----------+-------------------+ PFV      Full                                                              +---------+---------------+---------+-----------+----------+-------------------+ POP      Full           Yes      Yes                                      +---------+---------------+---------+-----------+----------+-------------------+ PTV      Full                                                             +---------+---------------+---------+-----------+----------+-------------------+ PERO     Full           Yes      Yes                                      +---------+---------------+---------+-----------+----------+-------------------+   +---------+---------------+---------+-----------+----------+--------------+ LEFT     CompressibilityPhasicitySpontaneityPropertiesThrombus Aging +---------+---------------+---------+-----------+----------+--------------+ CFV      Full           Yes      Yes                                 +---------+---------------+---------+-----------+----------+--------------+ SFJ      Full                                                        +---------+---------------+---------+-----------+----------+--------------+ FV Prox  Full                                                        +---------+---------------+---------+-----------+----------+--------------+ FV Mid   Full                                                        +---------+---------------+---------+-----------+----------+--------------+  FV DistalFull                                                        +---------+---------------+---------+-----------+----------+--------------+ PFV      Full                                                        +---------+---------------+---------+-----------+----------+--------------+ POP      Full           Yes      Yes                                 +---------+---------------+---------+-----------+----------+--------------+ PTV      Full                                                         +---------+---------------+---------+-----------+----------+--------------+ PERO     Full                                                        +---------+---------------+---------+-----------+----------+--------------+     Summary: BILATERAL: - No evidence of deep vein thrombosis seen in the lower extremities, bilaterally. - RIGHT: - There is no evidence of deep vein thrombosis in the lower extremity. However, portions of this examination were limited- see technologist comments above.  - No cystic structure found in the popliteal fossa.  LEFT: - There is no evidence of deep vein thrombosis in the lower extremity.  - No cystic structure found in the popliteal fossa.  *See table(s) above for measurements and observations. Electronically signed by Monica Martinez MD on 06/30/2022 at 2:45:16 PM.    Final    CT EXTREMITY LOWER RIGHT WO CONTRAST  Result Date: 06/18/2022 CLINICAL DATA:  Necrotizing fasciitis EXAM: CT OF THE LOWER RIGHT EXTREMITY WITHOUT CONTRAST TECHNIQUE: Multidetector CT imaging of the right lower extremity was performed according to the standard protocol. Images obtained of the right femur, right tibia-fibula, and right foot. RADIATION DOSE REDUCTION: This exam was performed according to the departmental dose-optimization program which includes automated exposure control, adjustment of the mA and/or kV according to patient size and/or use of iterative reconstruction technique. COMPARISON:  Foot x-ray 06/18/2022.  Abdominopelvic CT 06/18/2022 FINDINGS: RIGHT FEMUR: Bones/Joint/Cartilage No acute fracture or dislocation. Right hip joint is intact with mild joint space narrowing. No appreciable right hip joint effusion. No intra-articular gas in the right hip joint. No erosion or periosteal elevation. Knee joint alignment is maintained. Small-moderate size knee joint effusion, nonspecific. Ligaments Suboptimally assessed by CT. Muscles and Tendons Numerous foci of gas within the anterior  compartment of the right thigh. Gas is predominantly located along the fascial planes where there is extensive fat stranding and trace fluid. There are also foci of gas located intramuscularly, notably  involving the right sartorius and right iliopsoas muscles. Gas present within the sartorius muscle at the level of the distal femoral metaphysis (series 4, image 222). No well-defined intramuscular fluid collection. Grossly intact tendinous structures. Soft tissues Circumferential edema and ill-defined fluid throughout the right thigh. Soft tissue gas within the subcutaneous soft tissues of the right inguinal canal and anteromedial aspect of the proximal thigh. Enlarged right inguinal and right external iliac lymph nodes are likely reactive. Partially imaged complex collection in the pelvis along the right pelvic sidewall, better characterized by same day abdominopelvic CT. RIGHT TIBIA/FIBULA: Bones/Joint/Cartilage Tibia and fibula are intact without fracture or dislocation. No erosion or periosteal elevation. No tibiotalar joint effusion. Ligaments Suboptimally assessed by CT. Muscles and Tendons No intramuscular fluid collection within the lower leg musculature. No fascial or intramuscular gas. Soft tissues Circumferential subcutaneous edema and ill-defined fluid within the soft tissues of the lower leg. No organized fluid collection. No soft tissue gas. RIGHT FOOT: Bones/Joint/Cartilage Chronic-appearing bony changes of the midfoot which may be posttraumatic, neuropathic, or secondary to septic arthritis with prominent sclerosis and subchondral cysts or erosions and areas of bony fragmentation. Bulky callus formation of the second through fourth metatarsals. No findings of an acute fracture or dislocation. Detailed evaluation is limited on large field-of-view whole lower extremity images. Ligaments Suboptimally assessed by CT. Muscles and Tendons Fatty atrophy of the foot musculature, likely related to denervation.  No intramuscular gas. Soft tissues Generalized soft tissue swelling of the foot with fluid slightly more confluent along the medial aspect of the midfoot measuring approximately 3.3 x 1.3 cm (series 4, image 452). No discernible soft tissue wound or ulceration. No soft tissue gas within the foot. IMPRESSION: 1. Numerous foci of gas within the anterior compartment of the right thigh. Gas is predominantly located along the fascial planes where there is extensive fat stranding and trace fluid. There are also foci of gas located within the right sartorius muscle and right iliopsoas musculature. Intramuscular gas within the sartorius muscle is seen tracking inferiorly to the level of the distal femoral metaphysis. Findings are consistent with necrotizing fasciitis and infectious myositis. 2. Findings of diffuse cellulitis of the right lower extremity extending from the proximal thigh to the foot. Gas within the superficial soft tissues is localized to the right inguinal region and anteromedial aspect of the proximal thigh. 3. Chronic-appearing bony changes of the midfoot which may be posttraumatic, neuropathic, and/or secondary to septic arthritis. 4. Partially imaged complex collection in the pelvis along the right pelvic sidewall, better characterized by same day abdominopelvic CT. 5. Small-moderate size knee joint effusion, nonspecific. 6. Enlarged right inguinal and right external iliac lymph nodes are likely reactive. Electronically Signed   By: Davina Poke D.O.   On: 06/18/2022 12:54   CT FEMUR RIGHT WO CONTRAST  Result Date: 06/18/2022 CLINICAL DATA:  Necrotizing fasciitis EXAM: CT OF THE LOWER RIGHT EXTREMITY WITHOUT CONTRAST TECHNIQUE: Multidetector CT imaging of the right lower extremity was performed according to the standard protocol. Images obtained of the right femur, right tibia-fibula, and right foot. RADIATION DOSE REDUCTION: This exam was performed according to the departmental  dose-optimization program which includes automated exposure control, adjustment of the mA and/or kV according to patient size and/or use of iterative reconstruction technique. COMPARISON:  Foot x-ray 06/18/2022.  Abdominopelvic CT 06/18/2022 FINDINGS: RIGHT FEMUR: Bones/Joint/Cartilage No acute fracture or dislocation. Right hip joint is intact with mild joint space narrowing. No appreciable right hip joint effusion. No intra-articular gas in the  right hip joint. No erosion or periosteal elevation. Knee joint alignment is maintained. Small-moderate size knee joint effusion, nonspecific. Ligaments Suboptimally assessed by CT. Muscles and Tendons Numerous foci of gas within the anterior compartment of the right thigh. Gas is predominantly located along the fascial planes where there is extensive fat stranding and trace fluid. There are also foci of gas located intramuscularly, notably involving the right sartorius and right iliopsoas muscles. Gas present within the sartorius muscle at the level of the distal femoral metaphysis (series 4, image 222). No well-defined intramuscular fluid collection. Grossly intact tendinous structures. Soft tissues Circumferential edema and ill-defined fluid throughout the right thigh. Soft tissue gas within the subcutaneous soft tissues of the right inguinal canal and anteromedial aspect of the proximal thigh. Enlarged right inguinal and right external iliac lymph nodes are likely reactive. Partially imaged complex collection in the pelvis along the right pelvic sidewall, better characterized by same day abdominopelvic CT. RIGHT TIBIA/FIBULA: Bones/Joint/Cartilage Tibia and fibula are intact without fracture or dislocation. No erosion or periosteal elevation. No tibiotalar joint effusion. Ligaments Suboptimally assessed by CT. Muscles and Tendons No intramuscular fluid collection within the lower leg musculature. No fascial or intramuscular gas. Soft tissues Circumferential subcutaneous  edema and ill-defined fluid within the soft tissues of the lower leg. No organized fluid collection. No soft tissue gas. RIGHT FOOT: Bones/Joint/Cartilage Chronic-appearing bony changes of the midfoot which may be posttraumatic, neuropathic, or secondary to septic arthritis with prominent sclerosis and subchondral cysts or erosions and areas of bony fragmentation. Bulky callus formation of the second through fourth metatarsals. No findings of an acute fracture or dislocation. Detailed evaluation is limited on large field-of-view whole lower extremity images. Ligaments Suboptimally assessed by CT. Muscles and Tendons Fatty atrophy of the foot musculature, likely related to denervation. No intramuscular gas. Soft tissues Generalized soft tissue swelling of the foot with fluid slightly more confluent along the medial aspect of the midfoot measuring approximately 3.3 x 1.3 cm (series 4, image 452). No discernible soft tissue wound or ulceration. No soft tissue gas within the foot. IMPRESSION: 1. Numerous foci of gas within the anterior compartment of the right thigh. Gas is predominantly located along the fascial planes where there is extensive fat stranding and trace fluid. There are also foci of gas located within the right sartorius muscle and right iliopsoas musculature. Intramuscular gas within the sartorius muscle is seen tracking inferiorly to the level of the distal femoral metaphysis. Findings are consistent with necrotizing fasciitis and infectious myositis. 2. Findings of diffuse cellulitis of the right lower extremity extending from the proximal thigh to the foot. Gas within the superficial soft tissues is localized to the right inguinal region and anteromedial aspect of the proximal thigh. 3. Chronic-appearing bony changes of the midfoot which may be posttraumatic, neuropathic, and/or secondary to septic arthritis. 4. Partially imaged complex collection in the pelvis along the right pelvic sidewall, better  characterized by same day abdominopelvic CT. 5. Small-moderate size knee joint effusion, nonspecific. 6. Enlarged right inguinal and right external iliac lymph nodes are likely reactive. Electronically Signed   By: Davina Poke D.O.   On: 06/18/2022 12:54   CT TIBIA FIBULA RIGHT WO CONTRAST  Result Date: 06/18/2022 CLINICAL DATA:  Necrotizing fasciitis EXAM: CT OF THE LOWER RIGHT EXTREMITY WITHOUT CONTRAST TECHNIQUE: Multidetector CT imaging of the right lower extremity was performed according to the standard protocol. Images obtained of the right femur, right tibia-fibula, and right foot. RADIATION DOSE REDUCTION: This exam was performed  according to the departmental dose-optimization program which includes automated exposure control, adjustment of the mA and/or kV according to patient size and/or use of iterative reconstruction technique. COMPARISON:  Foot x-ray 06/18/2022.  Abdominopelvic CT 06/18/2022 FINDINGS: RIGHT FEMUR: Bones/Joint/Cartilage No acute fracture or dislocation. Right hip joint is intact with mild joint space narrowing. No appreciable right hip joint effusion. No intra-articular gas in the right hip joint. No erosion or periosteal elevation. Knee joint alignment is maintained. Small-moderate size knee joint effusion, nonspecific. Ligaments Suboptimally assessed by CT. Muscles and Tendons Numerous foci of gas within the anterior compartment of the right thigh. Gas is predominantly located along the fascial planes where there is extensive fat stranding and trace fluid. There are also foci of gas located intramuscularly, notably involving the right sartorius and right iliopsoas muscles. Gas present within the sartorius muscle at the level of the distal femoral metaphysis (series 4, image 222). No well-defined intramuscular fluid collection. Grossly intact tendinous structures. Soft tissues Circumferential edema and ill-defined fluid throughout the right thigh. Soft tissue gas within the  subcutaneous soft tissues of the right inguinal canal and anteromedial aspect of the proximal thigh. Enlarged right inguinal and right external iliac lymph nodes are likely reactive. Partially imaged complex collection in the pelvis along the right pelvic sidewall, better characterized by same day abdominopelvic CT. RIGHT TIBIA/FIBULA: Bones/Joint/Cartilage Tibia and fibula are intact without fracture or dislocation. No erosion or periosteal elevation. No tibiotalar joint effusion. Ligaments Suboptimally assessed by CT. Muscles and Tendons No intramuscular fluid collection within the lower leg musculature. No fascial or intramuscular gas. Soft tissues Circumferential subcutaneous edema and ill-defined fluid within the soft tissues of the lower leg. No organized fluid collection. No soft tissue gas. RIGHT FOOT: Bones/Joint/Cartilage Chronic-appearing bony changes of the midfoot which may be posttraumatic, neuropathic, or secondary to septic arthritis with prominent sclerosis and subchondral cysts or erosions and areas of bony fragmentation. Bulky callus formation of the second through fourth metatarsals. No findings of an acute fracture or dislocation. Detailed evaluation is limited on large field-of-view whole lower extremity images. Ligaments Suboptimally assessed by CT. Muscles and Tendons Fatty atrophy of the foot musculature, likely related to denervation. No intramuscular gas. Soft tissues Generalized soft tissue swelling of the foot with fluid slightly more confluent along the medial aspect of the midfoot measuring approximately 3.3 x 1.3 cm (series 4, image 452). No discernible soft tissue wound or ulceration. No soft tissue gas within the foot. IMPRESSION: 1. Numerous foci of gas within the anterior compartment of the right thigh. Gas is predominantly located along the fascial planes where there is extensive fat stranding and trace fluid. There are also foci of gas located within the right sartorius muscle  and right iliopsoas musculature. Intramuscular gas within the sartorius muscle is seen tracking inferiorly to the level of the distal femoral metaphysis. Findings are consistent with necrotizing fasciitis and infectious myositis. 2. Findings of diffuse cellulitis of the right lower extremity extending from the proximal thigh to the foot. Gas within the superficial soft tissues is localized to the right inguinal region and anteromedial aspect of the proximal thigh. 3. Chronic-appearing bony changes of the midfoot which may be posttraumatic, neuropathic, and/or secondary to septic arthritis. 4. Partially imaged complex collection in the pelvis along the right pelvic sidewall, better characterized by same day abdominopelvic CT. 5. Small-moderate size knee joint effusion, nonspecific. 6. Enlarged right inguinal and right external iliac lymph nodes are likely reactive. Electronically Signed   By: Davina Poke  D.O.   On: 06/18/2022 12:54   CT Foot Right Wo Contrast  Result Date: 06/18/2022 CLINICAL DATA:  Necrotizing fasciitis EXAM: CT OF THE LOWER RIGHT EXTREMITY WITHOUT CONTRAST TECHNIQUE: Multidetector CT imaging of the right lower extremity was performed according to the standard protocol. Images obtained of the right femur, right tibia-fibula, and right foot. RADIATION DOSE REDUCTION: This exam was performed according to the departmental dose-optimization program which includes automated exposure control, adjustment of the mA and/or kV according to patient size and/or use of iterative reconstruction technique. COMPARISON:  Foot x-ray 06/18/2022.  Abdominopelvic CT 06/18/2022 FINDINGS: RIGHT FEMUR: Bones/Joint/Cartilage No acute fracture or dislocation. Right hip joint is intact with mild joint space narrowing. No appreciable right hip joint effusion. No intra-articular gas in the right hip joint. No erosion or periosteal elevation. Knee joint alignment is maintained. Small-moderate size knee joint effusion,  nonspecific. Ligaments Suboptimally assessed by CT. Muscles and Tendons Numerous foci of gas within the anterior compartment of the right thigh. Gas is predominantly located along the fascial planes where there is extensive fat stranding and trace fluid. There are also foci of gas located intramuscularly, notably involving the right sartorius and right iliopsoas muscles. Gas present within the sartorius muscle at the level of the distal femoral metaphysis (series 4, image 222). No well-defined intramuscular fluid collection. Grossly intact tendinous structures. Soft tissues Circumferential edema and ill-defined fluid throughout the right thigh. Soft tissue gas within the subcutaneous soft tissues of the right inguinal canal and anteromedial aspect of the proximal thigh. Enlarged right inguinal and right external iliac lymph nodes are likely reactive. Partially imaged complex collection in the pelvis along the right pelvic sidewall, better characterized by same day abdominopelvic CT. RIGHT TIBIA/FIBULA: Bones/Joint/Cartilage Tibia and fibula are intact without fracture or dislocation. No erosion or periosteal elevation. No tibiotalar joint effusion. Ligaments Suboptimally assessed by CT. Muscles and Tendons No intramuscular fluid collection within the lower leg musculature. No fascial or intramuscular gas. Soft tissues Circumferential subcutaneous edema and ill-defined fluid within the soft tissues of the lower leg. No organized fluid collection. No soft tissue gas. RIGHT FOOT: Bones/Joint/Cartilage Chronic-appearing bony changes of the midfoot which may be posttraumatic, neuropathic, or secondary to septic arthritis with prominent sclerosis and subchondral cysts or erosions and areas of bony fragmentation. Bulky callus formation of the second through fourth metatarsals. No findings of an acute fracture or dislocation. Detailed evaluation is limited on large field-of-view whole lower extremity images. Ligaments  Suboptimally assessed by CT. Muscles and Tendons Fatty atrophy of the foot musculature, likely related to denervation. No intramuscular gas. Soft tissues Generalized soft tissue swelling of the foot with fluid slightly more confluent along the medial aspect of the midfoot measuring approximately 3.3 x 1.3 cm (series 4, image 452). No discernible soft tissue wound or ulceration. No soft tissue gas within the foot. IMPRESSION: 1. Numerous foci of gas within the anterior compartment of the right thigh. Gas is predominantly located along the fascial planes where there is extensive fat stranding and trace fluid. There are also foci of gas located within the right sartorius muscle and right iliopsoas musculature. Intramuscular gas within the sartorius muscle is seen tracking inferiorly to the level of the distal femoral metaphysis. Findings are consistent with necrotizing fasciitis and infectious myositis. 2. Findings of diffuse cellulitis of the right lower extremity extending from the proximal thigh to the foot. Gas within the superficial soft tissues is localized to the right inguinal region and anteromedial aspect of the proximal thigh.  3. Chronic-appearing bony changes of the midfoot which may be posttraumatic, neuropathic, and/or secondary to septic arthritis. 4. Partially imaged complex collection in the pelvis along the right pelvic sidewall, better characterized by same day abdominopelvic CT. 5. Small-moderate size knee joint effusion, nonspecific. 6. Enlarged right inguinal and right external iliac lymph nodes are likely reactive. Electronically Signed   By: Davina Poke D.O.   On: 06/18/2022 12:54   CT ABDOMEN PELVIS WO CONTRAST  Result Date: 06/18/2022 CLINICAL DATA:  41 year old female with history of right lower quadrant abdominal pain. Low hemoglobin. EXAM: CT ABDOMEN AND PELVIS WITHOUT CONTRAST TECHNIQUE: Multidetector CT imaging of the abdomen and pelvis was performed following the standard  protocol without IV contrast. RADIATION DOSE REDUCTION: This exam was performed according to the departmental dose-optimization program which includes automated exposure control, adjustment of the mA and/or kV according to patient size and/or use of iterative reconstruction technique. COMPARISON:  CT the abdomen and pelvis 08/27/2021. FINDINGS: Lower chest: Small amount of pericardial fluid and/or thickening, unlikely to be of hemodynamic significance at this time. Hepatobiliary: No suspicious cystic or solid hepatic lesions are confidently identified on today's noncontrast CT examination. Diffuse low attenuation throughout the hepatic parenchyma, indicative of a background of hepatic steatosis. Unenhanced appearance of the gallbladder is unremarkable. Pancreas: No definite pancreatic mass or peripancreatic fluid collections or inflammatory changes are noted on today's noncontrast CT examination. Spleen: Unremarkable. Adrenals/Urinary Tract: Mild right hydroureteronephrosis. Unenhanced appearance of the kidneys is otherwise unremarkable bilaterally. Right adrenal gland is normal in appearance. 2.8 x 2.1 cm low-attenuation (-7 HU) left adrenal nodule, compatible with a benign adenoma. No left hydroureteronephrosis. Urinary bladder is unremarkable in appearance. 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 No pathologic dilatation of small bowel or colon. Vascular/Lymphatic: No atherosclerotic calcifications are noted in the abdominal aorta. Mild atherosclerotic calcifications are noted in the pelvic vasculature. No definite lymphadenopathy confidently identified in the abdomen or pelvis. Reproductive: Uterus and ovaries are unremarkable in appearance. Other: Mild diffuse body wall edema. Presacral edema. Trace volume of ascites. No pneumoperitoneum. Musculoskeletal: Along the right pelvic sidewall (axial image 70 of series 3 and coronal image 72 of series 4) there is a mass-like area  measuring 6.8 x 6.0 x 7.8 cm which is internally low-attenuation, apparently centered in the right psoas musculature, with extension into the adjacent soft tissues, exerting substantial mass effect, particularly on the distal third of the right ureter. Gas is present in the deep soft tissues of the anterior compartment of the right thigh, tracking cephalad along the right iliacus musculature. There are no aggressive appearing lytic or blastic lesions noted in the visualized portions of the skeleton. IMPRESSION: 1. Findings, as above, highly concerning for necrotizing fasciitis in the right thigh. Further evaluation with noncontrast CT of the right lower extremity should be considered to better evaluate the full extent of these findings. 2. Low-attenuation mass-like lesion along the right pelvic sidewall centered in the inferior aspect of the right psoas muscle, likely a large abscess. This exerts mass effect upon adjacent structures, most notably the distal third of the right ureter, associated with mild proximal right hydroureteronephrosis. 3. No definite spontaneous intraperitoneal or retroperitoneal hemorrhage confidently identified on today's examination to account for the patient's low hematocrit level. 4. Trace volume of ascites. 5. Diffuse body wall edema. 6. Small amount of pericardial fluid and/or thickening, unlikely to be of hemodynamic significance at this time. 7. Hepatic steatosis. 8. Left adrenal adenoma. 9. Additional incidental findings, as above.  Critical Value/emergent results were called by telephone at the time of interpretation on 06/18/2022 at 9:58 am to provider [facility] , who verbally acknowledged these results. Electronically Signed   By: Vinnie Langton M.D.   On: 06/18/2022 10:02   DG Foot Complete Right  Result Date: 06/18/2022 CLINICAL DATA:  Foot swelling EXAM: RIGHT FOOT COMPLETE - 3 VIEW COMPARISON:  03/13/2022. FINDINGS: Disordered appearance of midfoot intertarsal  configuration and bony fragmentation suggest Charcot joint changes. No definite acute traumatic abnormality identified. No osteolytic or osteoblastic changes. There is soft tissue swelling of the mid to distal foot. Plantar calcaneal spur noted. A 9 mm radiopaque needle shaped structure identified in the plantar soft tissues which could be a foreign body. IMPRESSION: Midfoot Charcot joint changes. Persistent foreign body in the plantar soft tissues. Electronically Signed   By: Sammie Bench M.D.   On: 06/18/2022 09:29   DG Chest 2 View  Result Date: 06/18/2022 CLINICAL DATA:  fever EXAM: CHEST - 2 VIEW COMPARISON:  03/05/2022 FINDINGS: Cardiac silhouette enlarged. No evidence of pneumothorax or pleural effusion. No evidence of pulmonary edema. There are thoracic degenerative changes. IMPRESSION: Enlarged cardiac silhouette.  Lungs are clear. Electronically Signed   By: Sammie Bench M.D.   On: 06/18/2022 09:19    Labs:  Basic Metabolic Panel: Recent Labs  Lab 07/04/22 0713 07/04/22 1407 07/05/22 0941 07/05/22 1601 07/06/22 0935 07/07/22 0542  NA 142  --  141 140 141 141  K 6.1* 6.0* 6.4* 6.0* 5.6* 5.4*  CL 113*  --  112* 111 110 112*  CO2 25  --  23 24 24 24   GLUCOSE 137*  --  123* 148* 104* 115*  BUN 64*  --  67* 67* 71* 75*  CREATININE 4.81*  --  4.81* 4.87* 4.78* 5.03*  CALCIUM 7.6*  --  7.7* 7.5* 7.6* 7.4*  PHOS  --   --  5.1* 5.1* 5.2* 5.5*    CBC: Recent Labs  Lab 07/04/22 0713  WBC 10.5  HGB 8.9*  HCT 27.4*  MCV 93.5  PLT 302    CBG: Recent Labs  Lab 07/06/22 2104 07/07/22 0618 07/07/22 1138 07/07/22 1651 07/07/22 2208  GLUCAP 151* 100* 112* 123* 194*   Family history.  Mother with hypertension diabetes and thyroid disease.  Maternal grandmother with diabetes.  Denies any colon cancer esophageal cancer or rectal cancer  Brief HPI:   Eva Griffo is a 41 y.o. right-handed female with history of hypertension progressive CKD followed by [facility],  morbid obesity with gastric bypass 2020 diabetes mellitus.  Per chart review lives with spouse and children.  Independent prior to admission.  Presented 06/18/2022 with right groin and leg pain x 2 days.  Patient developed low-grade fever as well as bouts of nausea and vomiting.  Underwent CT abdomen pelvis with contrast concerning for necrotizing fasciitis in the right thigh masslike area measuring 6.8 x 6.0 x 7.8 cm along the right pelvic sidewall centered in the right psoas musculature with extension into the adjacent soft tissues, exerting substantial mass effect particularly on the distal third of the right ureter with gas present in the deep soft tissue of the anterior compartment of the right thigh along the right iliac musculature.  Noted WBC 27,800 lactic acid 1.9 hemoglobin 5.9 creatinine 5.57.  Patient underwent right thigh irrigation debridement as well as right pelvic irrigation debridement per Dr. Mable Fill as well as Dr. Kieth Brightly 06/18/2022.  She did return back to the OR for necrotizing  infection undergoing wound exploration of right thigh and right groin with irrigation debridement and dressing changes.  Maintained initially on broad-spectrum antibiotics.  She was extubated postoperatively.  Follow-up renal services for AKI on CKD stage IV likely due to ischemic ATN in the setting of sepsis.  Acute blood loss anemia 9.5 and monitored.  She was cleared to begin subcutaneous heparin for DVT prophylaxis.  Antibiotic regimen transition to Augmentin which was completed.  Diflucan added for oral candidiasis.  Therapy evaluations completed due to patient decreased functional mobility was admitted for a comprehensive rehab program.   Hospital Course: Cresencia Asmus was admitted to rehab 06/29/2022 for inpatient therapies to consist of PT, ST and OT at least three hours five days a week. Past admission physiatrist, therapy team and rehab RN have worked together to provide customized collaborative inpatient  rehab.  Pertaining to patient's right iliopsoas abscess, right intrapelvic necrotizing fasciitis right thigh necrotizing fasciitis.  Status post right thigh I&D as well as right pelvic I&D 06/18/2022  followed by Dr. Mable Fill as well as general surgery.  Wound VAC changes Monday Wednesday Friday.  Subcutaneous heparin for DVT prophylaxis.  Pain managed with use of Neurontin scheduled as well as hydrocodone as needed.  Mood stabilization with Zoloft.  Normocytic anemia remained on Aranesp.  AKI on CKD stage IV followed closely by renal services bouts of hyperkalemia receiving Lokelma until potassium 4.0 or less and latest potassium level 5.5 07/08/2022.  A home health nurse would follow-up monitoring chemistries with results to renal services Dr. Carolin Sicks.  Blood sugars monitored hemoglobin A1c 5.4.  Blood pressure controlled with Norvasc and she could resume her hydralazine.  Morbid obesity BMI 33.57 with dietary follow-up.   Blood pressures were monitored on TID basis and soft and monitored  Diabetes has been monitored with ac/hs CBG checks and SSI was use prn for tighter BS control.    Rehab course: During patient's stay in rehab weekly team conferences were held to monitor patient's progress, set goals and discuss barriers to discharge. At admission, patient required minimal assist sit to stand minimal assist 140 feet rolling walker  Physical exam.  Blood pressure 118/70 pulse 80 respirations 18 oxygen saturations 98% room air Constitutional.  No acute distress HEENT Head.  Normocephalic and atraumatic Eyes.  Pupils round and reactive to light no discharge without nystagmus Neck.  Supple nontender no JVD without thyromegaly Cardiac regular rate and rhythm without any extra sounds or murmur heard Abdomen.  Soft nontender positive bowel sounds without rebound Skin.  There was some serosanguineous drainage from the groin site Neurologic.  Alert oriented x 3 moving all extremities.  He/She  has had  improvement in activity tolerance, balance, postural control as well as ability to compensate for deficits. He/She has had improvement in functional use RUE/LUE  and RLE/LLE as well as improvement in awareness.  Perform standing transfers to mat rolling walker contact-guard.  Stand step back to bed rolling walker contact-guard.  Ambulates with assistive device.  Working with energy conservation techniques.  Completes dynamic standing balance with contact-guard.  Completed tasks ADLs with unilateral support rolling walker contact-guard.  Full family teaching completed plan discharge to home       Disposition: Discharge to home    Diet: Renal diet 1500 mL fluid restriction  Special Instructions: No driving smoking or alcohol  Wound VAC changes Monday Wednesday Friday.  Follow-up BMET Tuesday, 07/12/2022 to monitor potassium levels results to Dr.Bhandari renal services [phone]  Medications at discharge 1.  Tylenol as needed 2.  Norvasc 10 mg p.o. daily 3.  Rocaltrol 0.25 p.o. daily 4.  Neurontin 300 mg p.o. nightly 5.  Hydrocodone 1 to 2 tablets every 4 hours as needed pain 6.  Protonix 40 mg p.o. nightly 7.  Zoloft 100 mg p.o. nightly 8.  Sodium bicarbonate 1300 mg p.o. twice daily 9.  Hydralazine 10 mg daily 10.  Folic acid 1 mg daily 11.  Voltaren gel 2 g 4 times daily as needed 12.  Lasix 40 mg daily 13.Lokelma  10 mg TID   30-35 minutes were spent completing discharge summary and discharge planning     Follow-up Information     Meredith Staggers, MD Follow up.   Specialty: Physical Medicine and Rehabilitation Why: No formal follow-up needed Contact information: [address] [place] [address] [phone]         Georgeanna Harrison, MD Follow up.   Specialty: Orthopedic Surgery Why: Call for appointment Contact information: [place] [address] [phone]         Kinsinger, Arta Bruce, MD Follow up.   Specialty:  General Surgery Why: Call for appointment Contact information: [address] [place] [address] [phone]         Rosita Fire, MD Follow up.   Specialties: Nephrology, Internal Medicine Why: Call for appointment Contact information: [place] [address] [phone]                 Signed: Cathlyn Parsons 07/08/2022, 5:34 AM

## 2022-07-05 NOTE — Progress Notes (Signed)
Notified MD Laurena Bering) of critical potassium level 6.4.  Rehab PA also aware. New orders placed.

## 2022-07-06 LAB — RENAL FUNCTION PANEL
Albumin: 2 g/dL — ABNORMAL LOW (ref 3.5–5.0)
Anion gap: 7 (ref 5–15)
BUN: 71 mg/dL — ABNORMAL HIGH (ref 6–20)
CO2: 24 mmol/L (ref 22–32)
Calcium: 7.6 mg/dL — ABNORMAL LOW (ref 8.9–10.3)
Chloride: 110 mmol/L (ref 98–111)
Creatinine, Ser: 4.78 mg/dL — ABNORMAL HIGH (ref 0.44–1.00)
GFR, Estimated: 11 mL/min — ABNORMAL LOW (ref >60)
Glucose, Bld: 104 mg/dL — ABNORMAL HIGH (ref 70–99)
Phosphorus: 5.2 mg/dL — ABNORMAL HIGH (ref 2.5–4.6)
Potassium: 5.6 mmol/L — ABNORMAL HIGH (ref 3.5–5.1)
Sodium: 141 mmol/L (ref 135–145)

## 2022-07-06 LAB — GLUCOSE, CAPILLARY
Glucose-Capillary: 102 mg/dL — ABNORMAL HIGH (ref 70–99)
Glucose-Capillary: 102 mg/dL — ABNORMAL HIGH (ref 70–99)
Glucose-Capillary: 112 mg/dL — ABNORMAL HIGH (ref 70–99)
Glucose-Capillary: 151 mg/dL — ABNORMAL HIGH (ref 70–99)

## 2022-07-06 NOTE — Progress Notes (Signed)
PROGRESS NOTE   Subjective/Complaints: Pain in right thigh is a little better today. Had a good day with therapy yesterday.  ROS: Patient denies fever, rash, sore throat, blurred vision, dizziness, nausea, vomiting, diarrhea, cough, shortness of breath or chest pain,   headache, or mood change.   Objective:   US RENAL  Result Date: 07/05/2022 CLINICAL DATA:  Acute renal insufficiency EXAM: RENAL / URINARY TRACT ULTRASOUND COMPLETE COMPARISON:  06/18/2022 FINDINGS: Right Kidney: Renal measurements: 9.2 x 6.1 by 6.6 cm = volume: 194 mL. Increased renal cortical echotexture consistent with medical renal disease. Multiple simple right renal cysts, largest measuring 1.4 cm. No specific imaging follow-up is required. No hydronephrosis or nephrolithiasis. Left Kidney: Renal measurements: 7.7 x 4.2 by 4.5 cm = volume: 75 mL. The apparent atrophy of the left kidney based on these measurements is incongruent with the appearance on recent CT, likely due to suboptimal visualization due to patient body habitus. Increased renal cortical echotexture consistent with medical renal disease. No hydronephrosis or nephrolithiasis. Bladder: Appears normal for degree of bladder distention. Other: None. IMPRESSION: 1. Bilateral increased renal cortical echotexture consistent with medical renal disease. 2. The left kidney measures as atrophic, but is fairly symmetrical in size on recent CT. Evaluation is limited on this exam due to patient body habitus. Electronically Signed   By: Randa Ngo M.D.   On: 07/05/2022 15:55   Recent Labs    07/04/22 0713  WBC 10.5  HGB 8.9*  HCT 27.4*  PLT 302   Recent Labs    07/05/22 0941 07/05/22 1601  NA 141 140  K 6.4* 6.0*  CL 112* 111  CO2 23 24  GLUCOSE 123* 148*  BUN 67* 67*  CREATININE 4.81* 4.87*  CALCIUM 7.7* 7.5*    Intake/Output Summary (Last 24 hours) at 07/06/2022 0803 Last data filed at 07/05/2022  1435 Gross per 24 hour  Intake 356 ml  Output --  Net 356 ml        Physical Exam: Vital Signs Blood pressure 116/67, pulse 94, temperature 98.6 F (37 C), temperature source Oral, resp. rate 18, height 5\' 5"  (1.651 m), weight 86.2 kg, last menstrual period 06/15/2022, SpO2 100 %.  Constitutional: No distress . Vital signs reviewed. HEENT: NCAT, EOMI, oral membranes moist Neck: supple Cardiovascular: RRR without murmur. No JVD    Respiratory/Chest: CTA Bilaterally without wheezes or rales. Normal effort    GI/Abdomen: BS +, non-tender, non-distended Ext: no clubbing, cyanosis, or edema Psych: pleasant and cooperative  Skin: Wound vac to right thigh- and inguinal area. Sealed, serosang discharge in cannister.  Neuro:  Alert and oriented x 3. Normal insight and awareness. Intact Memory. Normal language and speech. Cranial nerve exam unremarkable. UE motor 5/5. RLE limited by open wound, discomfort.3- knee ext  ADF/APF 4/5.  Musculoskeletal: No pain with knee or ankle movement    Assessment/Plan: 1. Functional deficits which require 3+ hours per day of interdisciplinary therapy in a comprehensive inpatient rehab setting. Physiatrist is providing close team supervision and 24 hour management of active medical problems listed below. Physiatrist and rehab team continue to assess barriers to discharge/monitor patient progress toward functional and medical goals  Care  Tool:  Bathing    Body parts bathed by patient: Left arm, Right arm, Chest, Abdomen, Front perineal area, Face   Body parts bathed by helper: Right lower leg, Left lower leg, Left upper leg, Right upper leg, Buttocks     Bathing assist Assist Level: Moderate Assistance - Patient 50 - 74%     Upper Body Dressing/Undressing Upper body dressing   What is the patient wearing?: Pull over shirt, Bra    Upper body assist Assist Level: Set up assist    Lower Body Dressing/Undressing Lower body dressing      What is  the patient wearing?: Pants, Underwear/pull up     Lower body assist Assist for lower body dressing: Minimal Assistance - Patient > 75%     Toileting Toileting    Toileting assist Assist for toileting: Moderate Assistance - Patient 50 - 74%     Transfers Chair/bed transfer  Transfers assist     Chair/bed transfer assist level: Minimal Assistance - Patient > 75%     Locomotion Ambulation   Ambulation assist      Assist level: Total Assistance - Patient < 25% (due to buckling of RLE) Assistive device: Walker-rolling Max distance: 150'   Walk 10 feet activity   Assist     Assist level: Contact Guard/Touching assist Assistive device: Walker-rolling   Walk 50 feet activity   Assist    Assist level: Total Assistance - Patient < 25% Assistive device: Walker-rolling    Walk 150 feet activity   Assist    Assist level: Total Assistance - Patient < 25% Assistive device: Walker-rolling    Walk 10 feet on uneven surface  activity   Assist     Assist level: Total Assistance - Patient < 25% Assistive device: Walker-rolling   Wheelchair     Assist Is the patient using a wheelchair?: No             Wheelchair 50 feet with 2 turns activity    Assist            Wheelchair 150 feet activity     Assist          Blood pressure 116/67, pulse 94, temperature 98.6 F (37 C), temperature source Oral, resp. rate 18, height 5\' 5"  (1.651 m), weight 86.2 kg, last menstrual period 06/15/2022, SpO2 100 %.  Medical Problem List and Plan: 1. Functional deficits secondary to right iliopsoas abscess/right intrapelvic necrotizing fasciitis/right thigh necrotizing fasciitis.  Status post right thigh I&D as well as right pelvic I&D 06/18/2022 Pain related weakness vs RIght lumbar plexopathy              -patient may shower             -ELOS/Goals: 5-7 days modI             -Continue CIR therapies including PT, OT   2.   Antithrombotics: -DVT/anticoagulation:  Pharmaceutical: Heparin check vascular study             -antiplatelet therapy: N/A 3. Pain: continue Neurontin 300mg  qhs, hydrocodone as needed 4. Depression: continue Zoloft 100 mg nightly.  Provide emotional support             -antipsychotic agents: N/A 5. Neuropsych/cognition: This patient is capable of making decisions on her own behalf. 6. Skin/Wound Care: Routine skin checks  -right thigh and inguinal wounds: - Wound vac in place. Appreciate woc RN help -pt is tolerating it better now 7. Fluids/Electrolytes/Nutrition: Routine in  and outs with follow-up chemistries   -labs with HD per nephrology 8.  Normocytic anemia.  Follow-up CBC.  Continue Aranesp 9.  AKI on CKD stage IV.  AKI likely due to ischemic ATN in the setting of sepsis physiology.  Follow-up renal services  HyperK+ lokelma, mgt per nephrology  -pt better aware of renal diet 10.  Diabetes mellitus.  Hemoglobin A1c 5.4.  SSI  -12/20 reasonable control  CBG (last 3)  Recent Labs    07/05/22 1627 07/05/22 2049 07/06/22 0600  GLUCAP 138* 164* 102*    11.  Oral candidiasis.  Completing course of Diflucan 12.  Hypertension.  Norvasc 10 mg daily.    -12/15 controlled  -12/20 controlled 13.  Morbid obesity.  BMI 33.57. Follow-up dietary 14. Asterixis appears improved     LOS: 7 days A FACE TO FACE EVALUATION WAS PERFORMED  Meredith Staggers 07/06/2022, 8:03 AM

## 2022-07-06 NOTE — Progress Notes (Signed)
Occupational Therapy Session Note  Patient Details  Name: Jacqueline [name] MRN: [id number] Date of Birth: 01/24/1981  Today's Date: 07/06/2022 OT Individual Time: 0905-1015 OT Individual Time Calculation (min): 70 min    Short Term Goals: Week 1:  OT Short Term Goal 1 (Week 1): LTG=STG 2/2 ELOS  Skilled Therapeutic Interventions/Progress Updates:     Pt received in bed with unrated RLE pain. Rest and repositiong provided for pain relief  ADL: Pt completes ADL at overall set up level for BADL at EOB and toileting at ambulatory level. Pt husband educated on use of RW for BADL and set up for bathing needs. Pt with intermittent breaks in BADL with RN and labs coming in to draw blood and give meds. Pt declines donning bra d/t increased difficulty.   Therapeutic exercise Pt completes 2x10-15 dowel rod therex for BUE shoulder strengthening required for BADLs/functional transfers as follows with demo cuing and 5-7 # dowel rod  Shoulder flex/ext, shoulder press, horizonal ab/adduct Circles in B directions Elbow flex/ext, chest press Wrist flex/ext  Pt completes 3x30 ball toss (chest, bounce, overhead pass) in seated position with 4 # wrist weights to improve BUE coordination/strengthening required for BADLs/functional transfers.   Pt completes 4 min on the UE ergometer on level 2 resistance >40 PRM 2 min forward/2 min backwards for activity tolerance and strengthening   Pt left at end of session in bed with exit alarm on, call light in reach and all needs met   Therapy Documentation Precautions:  Precautions Precautions: Fall Precaution Comments: RLE buckling - watch BP Restrictions Weight Bearing Restrictions: Yes RLE Weight Bearing: Weight bearing as tolerated General:    Therapy/Group: Individual Therapy  Stephanie M Schlosser 07/06/2022, 6:55 AM 

## 2022-07-06 NOTE — Consult Note (Signed)
WOC Nurse Consult Note: Reason for Consult: Changed NPWT to right thigh and right inguinal wounds. Difficult to obtain a seal related to skin folds surrounding.  Wound type:Full thickness  Pressure Injury POA: N/A Measurement: Right thigh:  21cm x 9cm x 2cm with undermining at 4 o'clock measuring 5cm Right inguinal area: 3cm x 8cm x 4 cm with undermining at 4 cm at 5 o'clock Wound bed: red, moist Drainage (amount, consistency, odor)  large lymphatic serous, drainage Periwound: intact Dressing procedure/placement/frequency: Wounds cleansed with NS, dried. Skin barrier rings used to encircle wounds to enhance seal. White foam (1 piece) placed into right thigh wound, topped with 1 piece of black foam. One piece of black foam used to fill right inguinal wound. Drape applied, dressings attached to 116mmHg continuous negative pressure using a "Y" connector. An immediate seal is achieved. Patient premedicated prior to NPWT change,  tolerated procedure well.   Next dressing change is due on FRIDAY, 07/08/22. Dressing changes by [place] Team are M/W/F.Please re-consult if further assistance is needed.  Thank-you,  Julien Girt MSN, [place], [place], [place], [place]

## 2022-07-06 NOTE — Progress Notes (Signed)
Brief note:  K 5.6 and Cr at baseline Continue lokelma TID until K is 4.0, then can d/c Continue Lasix Continue renal diet  Otherwise no further suggestions.    Have ordered renal panel for daily x 3 days.  Will sign off.  Call with questions.    Madelon Lips MD [employer] [phone]

## 2022-07-06 NOTE — Progress Notes (Signed)
Physical Therapy Session Note  Patient Details  Name: Jacqueline [name] MRN: [id number] Date of Birth: [birth year]  Today's Date: 07/06/2022 PT Individual Time: 1050-1200 and 5329-9242 PT Individual Time Calculation (min): 70 min and 59 min  Short Term Goals: Week 1:  PT Short Term Goal 1 (Week 1): STGs = LTGs  Skilled Therapeutic Interventions/Progress Updates:     1st Session: Pt received semi-reclined in bed and agrees to therapy. Reports pain in R inner thigh "isn't bad". PT provides rest breaks to manage pain. Supine to sit with cues for positioning at EOB. Stand pivot transfer to [facility] with RW and cues for sequencing. WC transport to gym. Pt ambulate x400' with RW and cues for upright gaze to improve posture and balance, proper distance from AD for optimal body mechanics, and attempting to achieve symmetrical reciprocal gait pattern. Pt ambulates with close supervision and no instance of R lower extremity buckling.  PT challenges pt to ambulate again with increased stride length to approximate pt's typical gait pattern. Pt ambulates x400' with good follow through on PT cueing and no noted buckling.   PT demonstrates stepping up onto 3.5" step with R lower extremity and stepping back with L lower extremity. PT educates on loading through R lower extremity and using bilateral upper extremities to support but not take all of pt's weight, intended to strengthen R lower extremity and retrain motor planning. Pt completes 3x10 step ups with CGA.   Pt stands without RW to increase balance challenge as well as promoting increased loading through R lower extremity. PT provides CGA/minA at hips for stability. Pt initially shifts weight laterally from R<>L, progressing to marching in place. Pt then performs minisquat with PT providing external cue for degree of knee flexion desired, as well as providing block if R lower extremity were to buckle (no buckling noted). Pt completes x10 minisquat prior to rest  break.  2nd Session: Pt received ambulating back from restroom with RW. PT provides cues for positioning to transfer back to [facility]. Pt reports 5/10 pain in R quad. PT provides rest breaks as needed to manage pain. Pt transported to gym via [facility] for time management. Stand step to mat with RW and cues for positioning. Pt performs seated heel slides in short sitting with towel under R foot to decrease friction. Pt noted to have increased AROM and ease of movement.   Pt self propels WC to work on functional endurance and upper extremity strengthening. Pt pushes x400' with bilateral upper extremities and PT uces for optimal propulsion mechanics for efficiency and power.  Pt performs forward and backward ambulation without use of RW to challenge dynamic balance and strength. Pt completes several bouts of x5' forward and x5' backward multiple reps with PT providing CGA at hips. Pt does not have any instances of buckling through R lower extremity.   Pt then performs Berg balance test with score of 41/56, indicating that pt is at risk for falls and would benefit from use of an AD. PT provides education and interpretation of results to pt. WC transport back to room. Stand step to bed with CGA and no AD. Pt performs sit to supine with verbal cues for sequencing. Left supine with alarm intact and all needs within reach.  Therapy Documentation Precautions:  Precautions Precautions: Fall Precaution Comments: RLE buckling - watch BP Restrictions Weight Bearing Restrictions: Yes RLE Weight Bearing: Weight bearing as tolerated   Therapy/Group: Individual Therapy  Breck Coons, PT, DPT 07/06/2022, 5:21 PM

## 2022-07-07 ENCOUNTER — Encounter (HOSPITAL_COMMUNITY): Payer: Self-pay

## 2022-07-07 ENCOUNTER — Other Ambulatory Visit (HOSPITAL_COMMUNITY): Payer: Self-pay

## 2022-07-07 LAB — RENAL FUNCTION PANEL
Albumin: 1.6 g/dL — ABNORMAL LOW (ref 3.5–5.0)
Anion gap: 5 (ref 5–15)
BUN: 75 mg/dL — ABNORMAL HIGH (ref 6–20)
CO2: 24 mmol/L (ref 22–32)
Calcium: 7.4 mg/dL — ABNORMAL LOW (ref 8.9–10.3)
Chloride: 112 mmol/L — ABNORMAL HIGH (ref 98–111)
Creatinine, Ser: 5.03 mg/dL — ABNORMAL HIGH (ref 0.44–1.00)
GFR, Estimated: 10 mL/min — ABNORMAL LOW (ref >60)
Glucose, Bld: 115 mg/dL — ABNORMAL HIGH (ref 70–99)
Phosphorus: 5.5 mg/dL — ABNORMAL HIGH (ref 2.5–4.6)
Potassium: 5.4 mmol/L — ABNORMAL HIGH (ref 3.5–5.1)
Sodium: 141 mmol/L (ref 135–145)

## 2022-07-07 LAB — GLUCOSE, CAPILLARY
Glucose-Capillary: 100 mg/dL — ABNORMAL HIGH (ref 70–99)
Glucose-Capillary: 112 mg/dL — ABNORMAL HIGH (ref 70–99)
Glucose-Capillary: 123 mg/dL — ABNORMAL HIGH (ref 70–99)
Glucose-Capillary: 194 mg/dL — ABNORMAL HIGH (ref 70–99)

## 2022-07-07 MED ORDER — AMLODIPINE BESYLATE 10 MG PO TABS
10.0000 mg | ORAL_TABLET | Freq: Every day | ORAL | 0 refills | Status: DC
  Filled 2022-07-07: qty 30, 30d supply, fill #0

## 2022-07-07 MED ORDER — NYSTATIN 100000 UNIT/GM EX POWD
Freq: Three times a day (TID) | CUTANEOUS | 0 refills | Status: DC
  Filled 2022-07-07: qty 15, 15d supply, fill #0

## 2022-07-07 MED ORDER — CALCITRIOL 0.25 MCG PO CAPS
0.2500 ug | ORAL_CAPSULE | Freq: Every day | ORAL | 0 refills | Status: DC
  Filled 2022-07-07: qty 30, 30d supply, fill #0

## 2022-07-07 MED ORDER — DICLOFENAC SODIUM 1 % EX GEL
2.0000 g | Freq: Four times a day (QID) | CUTANEOUS | 0 refills | Status: DC | PRN
  Filled 2022-07-07: qty 100, fill #0

## 2022-07-07 MED ORDER — PANTOPRAZOLE SODIUM 40 MG PO TBEC
40.0000 mg | DELAYED_RELEASE_TABLET | Freq: Every day | ORAL | 0 refills | Status: DC
  Filled 2022-07-07: qty 30, 30d supply, fill #0

## 2022-07-07 MED ORDER — FUROSEMIDE 40 MG PO TABS
40.0000 mg | ORAL_TABLET | Freq: Every day | ORAL | 0 refills | Status: DC
  Filled 2022-07-07: qty 30, 30d supply, fill #0

## 2022-07-07 MED ORDER — SERTRALINE HCL 100 MG PO TABS
100.0000 mg | ORAL_TABLET | Freq: Every day | ORAL | 0 refills | Status: DC
  Filled 2022-07-07: qty 30, 30d supply, fill #0

## 2022-07-07 MED ORDER — SODIUM ZIRCONIUM CYCLOSILICATE 10 G PO PACK
10.0000 g | PACK | Freq: Three times a day (TID) | ORAL | 0 refills | Status: DC
  Filled 2022-07-07: qty 30, 10d supply, fill #0

## 2022-07-07 MED ORDER — HYDROCODONE-ACETAMINOPHEN 5-325 MG PO TABS
1.0000 | ORAL_TABLET | ORAL | 0 refills | Status: DC | PRN
  Filled 2022-07-07: qty 30, 5d supply, fill #0

## 2022-07-07 MED ORDER — HYDRALAZINE HCL 10 MG PO TABS
10.0000 mg | ORAL_TABLET | Freq: Every day | ORAL | 0 refills | Status: DC
  Filled 2022-07-07: qty 30, 30d supply, fill #0

## 2022-07-07 MED ORDER — VITAMIN D (ERGOCALCIFEROL) 1.25 MG (50000 UNIT) PO CAPS
50000.0000 [IU] | ORAL_CAPSULE | ORAL | 0 refills | Status: AC
  Filled 2022-07-07: qty 4, 28d supply, fill #0

## 2022-07-07 MED ORDER — SODIUM BICARBONATE 650 MG PO TABS
1300.0000 mg | ORAL_TABLET | Freq: Two times a day (BID) | ORAL | 0 refills | Status: DC
  Filled 2022-07-07: qty 120, 30d supply, fill #0

## 2022-07-07 MED ORDER — ACETAMINOPHEN 325 MG PO TABS: 325.0000 mg | ORAL_TABLET | ORAL | Status: DC | PRN

## 2022-07-07 MED ORDER — GABAPENTIN 300 MG PO CAPS
300.0000 mg | ORAL_CAPSULE | Freq: Every day | ORAL | 0 refills | Status: DC
  Filled 2022-07-07: qty 30, 30d supply, fill #0

## 2022-07-07 NOTE — Progress Notes (Signed)
Physical Therapy Discharge Summary  Patient Details  Name: Jacqueline [name] MRN: [id number] Date of Birth: Jan 11, 1981  Date of Discharge from PT service:July 07, 2022  Today's Date: 07/07/2022 PT Individual Time: 1002-1114 PT Individual Time Calculation (min): 72 min    Patient has met 9 of 9 long term goals due to improved activity tolerance, improved balance, improved postural control, and increased strength.  Patient to discharge at an ambulatory level Supervision.   Patient's care partner is independent to provide the necessary physical assistance at discharge.  Reasons goals not met: NA  Recommendation:  Patient will benefit from ongoing skilled PT services in outpatient setting to continue to advance safe functional mobility, address ongoing impairments in strength, balance, ambulation, endurance, and minimize fall risk.  Equipment: RW and 18" x 18" manual WC  Reasons for discharge: treatment goals met and discharge from hospital  Patient/family agrees with progress made and goals achieved: Yes  Skilled Therapeutic Interventions: Pt received supine in bed and agrees to therapy. No complaint of pain. Supine to sit with PT managing wound vac and lines. Sit to stand and stand step to [facility] with RW and PT managing wound vac. WC transport to gym. Pt completes car transfer and ramp navigation with RW and cues for sequencing and safety. Pt ambulates x50' to stairs with RW and completes x12 6" steps with bilateral hand rails and cues for step sequencing and positioning. Seated rest break. Pt completes 6 Minute Walk Test with RW, achieving score of 840', up from 491' on previous attempt. Seated rest break. Pt perform stand step to Nustep without AD, with CGA and cues for positioning. Pt completes Nustep with green therband used to support R lower extremity. Pt completes x12:00 at workload of 6 with average steps per minute ~60. Completed for endurance training as well as strengthening of R lower  extremity. Pt requires rest break due to fatigue and report of pain in R lower extremity. PT encourages rest break to manage pain. WC transport back to room. Stand step to bed without AD. Sit to supine with PT managing wound vac. Left with all needs within reach.   PT Discharge Precautions/Restrictions Precautions Precautions: (P) Fall Precaution Comments: RLE buckling Restrictions Weight Bearing Restrictions: Yes RLE Weight Bearing: Weight bearing as tolerated Pain Interference Pain Interference Pain Effect on Sleep: 1. Rarely or not at all Pain Interference with Therapy Activities: 1. Rarely or not at all Pain Interference with Day-to-Day Activities: 1. Rarely or not at all Vision/Perception  Vision - History Ability to See in Adequate Light: 1 Impaired Perception Perception: Within Functional Limits Praxis Praxis: Intact  Cognition Overall Cognitive Status: Within Functional Limits for tasks assessed Arousal/Alertness: Awake/alert Orientation Level: Oriented X4 Safety/Judgment: Appears intact Sensation Sensation Light Touch: Impaired Detail Light Touch Impaired Details: Impaired RLE Additional Comments: R thigh nearly absent sensation, Sensation intact in lower leg, R foot impaired due to baseline neuropathy Coordination Gross Motor Movements are Fluid and Coordinated: Yes Fine Motor Movements are Fluid and Coordinated: Yes Motor  Motor Motor: Within Functional Limits Motor - Discharge Observations: Weakness in RLE improving. R quad most impaired.  Mobility Bed Mobility Bed Mobility: Supine to Sit;Sit to Supine Supine to Sit: Supervision/Verbal cueing Sit to Supine: Supervision/Verbal cueing Transfers Transfers: Sit to Stand;Stand to Sit;Stand Pivot Transfers Sit to Stand: Supervision/Verbal cueing Stand to Sit: Supervision/Verbal cueing Stand Pivot Transfers: Supervision/Verbal cueing Stand Pivot Transfer Details: Verbal cues for sequencing;Verbal cues for  technique Transfer (Assistive device): Rolling walker Locomotion  Gait  Ambulation: Yes Gait Assistance: Supervision/Verbal cueing Gait Distance (Feet): 840 Feet Assistive device: Rolling walker Gait Assistance Details: Verbal cues for gait pattern Gait Gait: Yes Gait Pattern: Impaired Gait Pattern: Step-through pattern;Decreased stride length Stairs / Additional Locomotion Stairs: Yes Stairs Assistance: Supervision/Verbal cueing Stair Management Technique: Two rails Number of Stairs: 12 Height of Stairs: 6 Ramp: Supervision/Verbal cueing Curb: Supervision/Verbal cueing Wheelchair Mobility Wheelchair Mobility: No  Trunk/Postural Assessment  Cervical Assessment Cervical Assessment: Within Functional Limits Thoracic Assessment Thoracic Assessment: Within Functional Limits Lumbar Assessment Lumbar Assessment: Within Functional Limits Postural Control Postural Control: Deficits on evaluation (Protective Responses slightly impaired. Improved from eval.)  Balance Balance Balance Assessed: Yes Static Sitting Balance Static Sitting - Balance Support: Feet supported Static Sitting - Level of Assistance: 7: Independent Dynamic Sitting Balance Dynamic Sitting - Balance Support: Feet supported Dynamic Sitting - Level of Assistance: 7: Independent Static Standing Balance Static Standing - Balance Support: During functional activity Static Standing - Level of Assistance: 5: Stand by assistance Dynamic Standing Balance Dynamic Standing - Balance Support: During functional activity Dynamic Standing - Level of Assistance: 5: Stand by assistance Extremity Assessment  RLE Assessment RLE Assessment: Exceptions to [facility] RLE Strength Right Hip Flexion: 2+/5 Right Hip ABduction: 3+/5 Right Hip ADduction: 3+/5 Right Knee Flexion: 3+/5 Right Knee Extension: 2/5 Right Ankle Dorsiflexion: 3+/5 LLE Assessment LLE Assessment: Within Functional Limits   Breck Coons, PT,  DPT 07/07/2022, 4:09 PM

## 2022-07-07 NOTE — Progress Notes (Signed)
Occupational Therapy Session Note  Patient Details  Name: Jacqueline [name] MRN: [id number] Date of Birth: 10-Feb-1981  Today's Date: 07/07/2022 Session 1 OT Individual Time: 0845-1000 OT Individual Time Calculation (min): 75 min   Session 2 OT Individual Time: 1410-1505 OT Individual Time Calculation (min): 55 min    Short Term Goals: Week 1:  OT Short Term Goal 1 (Week 1): LTG=STG 2/2 ELOS  Skilled Therapeutic Interventions/Progress Updates:    Session 1 Pt greeted seated on commode after successful BM and voided bladder. Pt completed toileting tasks mod I. Pt donned pants mod I, but supervision for balance when standing to pull up pants. Pt ambulated to therapy gym w/ RW and supevrision. Worked on R LE strength with step ups on R LE onto 2 inch step. 3 sets of 10 step ups. Standing balance/endurance standing on foam block, then clothes pins placed around waist to simulate clothing management. Pt then instructed to reach outside bsae of support to there R to challenge R knee strength and place on basketball goal. 3 sets of this activity with rest breaks  3 sets off 10 seated hip extension. Pt then ambulated to therapy apartment and we practiced RW management with walker bag reaching into cabinets to collect items for simulated meal prep. Discussed home set-up and modifications for safe BADL and IADL participation. Pt ambulated back to room and used gait belt as leg lifter to bring R LE back into bed. Pt left semi-reclined in bed with bed alarm on, call bell in reach, and needs met.   Session 2 Pt greeted semi-reclined in bed and agreeable to OT treatment session. Pt completed bed mobility with supervision. She ambulated to therapy gym w/ RW and supervision. Worked on standing balance/endurance with standing reindeer toss game. Practiced weight shifting with step and follow through onto R LE. Pt with no knee buckling with weight shift. Continued addressing standing balance/endurance with standing  ornament painting. Pt tolerated standing for 10 minutes while deconrating ornament. Addressed collecting food items from table and educated on transferring items with RW and use of RW bag. Pt ambulated back to room at end of session in similar fashion and was able to lift R LE back into bed with leg lifter. Pt left semi-reclined in bed with needs met.   Therapy Documentation Precautions:  Precautions Precautions: Fall Precaution Comments: RLE buckling Restrictions Weight Bearing Restrictions: Yes RLE Weight Bearing: Weight bearing as tolerated  Pain: Pain Assessment Pain Scale: 0-10 Pain Score: 0-No painPt reports pain is managed.   Therapy/Group: Individual Therapy  Valma Cava 07/07/2022, 3:18 PM

## 2022-07-07 NOTE — Plan of Care (Signed)
  Problem: Consults Goal: RH GENERAL PATIENT EDUCATION Description: See Patient Education module for education specifics. Outcome: Progressing   Problem: RH SKIN INTEGRITY Goal: RH STG SKIN FREE OF INFECTION/BREAKDOWN Description: Manage w min assist Outcome: Progressing Goal: RH STG MAINTAIN SKIN INTEGRITY WITH ASSISTANCE Description: STG Maintain Skin Integrity With min  Assistance. Outcome: Progressing Goal: RH STG ABLE TO PERFORM INCISION/WOUND CARE W/ASSISTANCE Description: STG Able To Perform Incision/Wound Care With min  Assistance. Outcome: Progressing   Problem: RH SAFETY Goal: RH STG ADHERE TO SAFETY PRECAUTIONS W/ASSISTANCE/DEVICE Description: STG Adhere to Safety Precautions With cues Assistance/Device. Outcome: Progressing   Problem: RH PAIN MANAGEMENT Goal: RH STG PAIN MANAGED AT OR BELOW PT'S PAIN GOAL Description: < 4 with prns Outcome: Progressing   Problem: RH KNOWLEDGE DEFICIT GENERAL Goal: RH STG INCREASE KNOWLEDGE OF SELF CARE AFTER HOSPITALIZATION Description: Patient and spouse will be able to manage care at discharge using educational handouts and educational resources independently Outcome: Progressing

## 2022-07-07 NOTE — Progress Notes (Signed)
Patient ID: Jacqueline [name], female   DOB: December 25, 1980, 41 y.o.   MRN: [id number]  Per medical, pt will need lab draw- all chemistries/BMET 07/12/2022 results to Dr. Carolin Sicks [phone].  SW informed [facility] on lab request.   Loralee Pacas, MSW, [place] Office: [phone] Cell: [phone] Fax: [phone]

## 2022-07-07 NOTE — Plan of Care (Signed)
  Problem: RH Balance Goal: LTG Patient will maintain dynamic standing with ADLs (OT) Description: LTG:  Patient will maintain dynamic standing balance with assist during activities of daily living (OT)  Outcome: Completed/Met   Problem: RH Bathing Goal: LTG Patient will bathe all body parts with assist levels (OT) Description: LTG: Patient will bathe all body parts with assist levels (OT) Outcome: Completed/Met   Problem: Sit to Stand Goal: LTG:  Patient will perform sit to stand in prep for activites of daily living with assistance level (OT) Description: LTG:  Patient will perform sit to stand in prep for activites of daily living with assistance level (OT) Outcome: Completed/Met   Problem: RH Dressing Goal: LTG Patient will perform upper body dressing (OT) Description: LTG Patient will perform upper body dressing with assist, with/without cues (OT). Outcome: Completed/Met Goal: LTG Patient will perform lower body dressing w/assist (OT) Description: LTG: Patient will perform lower body dressing with assist, with/without cues in positioning using equipment (OT) Outcome: Completed/Met   Problem: RH Toileting Goal: LTG Patient will perform toileting task (3/3 steps) with assistance level (OT) Description: LTG: Patient will perform toileting task (3/3 steps) with assistance level (OT)  Outcome: Completed/Met   Problem: RH Simple Meal Prep Goal: LTG Patient will perform simple meal prep w/assist (OT) Description: LTG: Patient will perform simple meal prep with assistance, with/without cues (OT). Outcome: Completed/Met   Problem: RH Toilet Transfers Goal: LTG Patient will perform toilet transfers w/assist (OT) Description: LTG: Patient will perform toilet transfers with assist, with/without cues using equipment (OT) Outcome: Completed/Met   Problem: RH Tub/Shower Transfers Goal: LTG Patient will perform tub/shower transfers w/assist (OT) Description: LTG: Patient will perform  tub/shower transfers with assist, with/without cues using equipment (OT) Outcome: Completed/Met

## 2022-07-07 NOTE — Progress Notes (Signed)
Inpatient Rehabilitation Discharge Medication Review by a Pharmacist  A complete drug regimen review was completed for this patient to identify any potential clinically significant medication issues.  High Risk Drug Classes Is patient taking? Indication by Medication  Antipsychotic No   Anticoagulant No   Antibiotic No   Opioid Yes Vicodin-prn pain  Antiplatelet No   Hypoglycemics/insulin No   Vasoactive Medication Yes Amlodipine, hydralazine- bp  Chemotherapy No   Other Yes Tylenol-pain Rocaltrol, sodium bicarb-CKD-BMD Neurontin- pain Protonix-GERD Zoloft- depression Sodium bicarb- Voltaren gel-pain Lasix-fluid Nystatin-candida inner thighs     Type of Medication Issue Identified Description of Issue Recommendation(s)  Drug Interaction(s) (clinically significant)     Duplicate Therapy     Allergy     No Medication Administration End Date     Incorrect Dose     Additional Drug Therapy Needed     Significant med changes from prior encounter (inform family/care partners about these prior to discharge).    Other       Clinically significant medication issues were identified that warrant physician communication and completion of prescribed/recommended actions by midnight of the next day:  No  Name of provider notified for urgent issues identified:   Provider Method of Notification:   Pharmacist comments:   Time spent performing this drug regimen review (minutes):  20 minutes  Sandford Craze, PharmD. [facility] PGY-1 07/07/2022 7:15 AM

## 2022-07-07 NOTE — Progress Notes (Signed)
PROGRESS NOTE   Subjective/Complaints: RIght thigh and inguinal pain improving. No issues this morning. Excited to be going tomorrow.   ROS: Patient denies fever, rash, sore throat, blurred vision, dizziness, nausea, vomiting, diarrhea, cough, shortness of breath or chest pain,  headache, or mood change.   Objective:   US RENAL  Result Date: 07/05/2022 CLINICAL DATA:  Acute renal insufficiency EXAM: RENAL / URINARY TRACT ULTRASOUND COMPLETE COMPARISON:  06/18/2022 FINDINGS: Right Kidney: Renal measurements: 9.2 x 6.1 by 6.6 cm = volume: 194 mL. Increased renal cortical echotexture consistent with medical renal disease. Multiple simple right renal cysts, largest measuring 1.4 cm. No specific imaging follow-up is required. No hydronephrosis or nephrolithiasis. Left Kidney: Renal measurements: 7.7 x 4.2 by 4.5 cm = volume: 75 mL. The apparent atrophy of the left kidney based on these measurements is incongruent with the appearance on recent CT, likely due to suboptimal visualization due to patient body habitus. Increased renal cortical echotexture consistent with medical renal disease. No hydronephrosis or nephrolithiasis. Bladder: Appears normal for degree of bladder distention. Other: None. IMPRESSION: 1. Bilateral increased renal cortical echotexture consistent with medical renal disease. 2. The left kidney measures as atrophic, but is fairly symmetrical in size on recent CT. Evaluation is limited on this exam due to patient body habitus. Electronically Signed   By: Randa Ngo M.D.   On: 07/05/2022 15:55   No results for input(s): "WBC", "HGB", "HCT", "PLT" in the last 72 hours.  Recent Labs    07/06/22 0935 07/07/22 0542  NA 141 141  K 5.6* 5.4*  CL 110 112*  CO2 24 24  GLUCOSE 104* 115*  BUN 71* 75*  CREATININE 4.78* 5.03*  CALCIUM 7.6* 7.4*    Intake/Output Summary (Last 24 hours) at 07/07/2022 0751 Last data filed at  07/07/2022 0500 Gross per 24 hour  Intake 600 ml  Output 500 ml  Net 100 ml        Physical Exam: Vital Signs Blood pressure 121/73, pulse 89, temperature 98.5 F (36.9 C), temperature source Oral, resp. rate 18, height 5\' 5"  (1.651 m), weight 86.2 kg, last menstrual period 06/15/2022, SpO2 100 %.  Constitutional: No distress . Vital signs reviewed. HEENT: NCAT, EOMI, oral membranes moist Neck: supple Cardiovascular: RRR without murmur. No JVD    Respiratory/Chest: CTA Bilaterally without wheezes or rales. Normal effort    GI/Abdomen: BS +, non-tender, non-distended Ext: no clubbing, cyanosis, or edema Psych: pleasant and cooperative  Skin: Wound vac to right thigh- and inguinal area. Sealed, moderate serosang discharge in cannister. Neuro:  Alert and oriented x 3. Normal insight and awareness. Intact Memory. Normal language and speech. Cranial nerve exam unremarkable. UE motor 5/5. RLE limited by open wound, discomfort.3- knee ext  ADF/APF 4/5.  Musculoskeletal: No pain with knee or ankle movement    Assessment/Plan: 1. Functional deficits which require 3+ hours per day of interdisciplinary therapy in a comprehensive inpatient rehab setting. Physiatrist is providing close team supervision and 24 hour management of active medical problems listed below. Physiatrist and rehab team continue to assess barriers to discharge/monitor patient progress toward functional and medical goals  Care Tool:  Bathing    Body  parts bathed by patient: Left arm, Right arm, Chest, Abdomen, Front perineal area, Face   Body parts bathed by helper: Right lower leg, Left lower leg, Left upper leg, Right upper leg, Buttocks     Bathing assist Assist Level: Moderate Assistance - Patient 50 - 74%     Upper Body Dressing/Undressing Upper body dressing   What is the patient wearing?: Pull over shirt, Bra    Upper body assist Assist Level: Set up assist    Lower Body Dressing/Undressing Lower body  dressing      What is the patient wearing?: Pants, Underwear/pull up     Lower body assist Assist for lower body dressing: Minimal Assistance - Patient > 75%     Toileting Toileting    Toileting assist Assist for toileting: Moderate Assistance - Patient 50 - 74%     Transfers Chair/bed transfer  Transfers assist     Chair/bed transfer assist level: Minimal Assistance - Patient > 75%     Locomotion Ambulation   Ambulation assist      Assist level: Total Assistance - Patient < 25% (due to buckling of RLE) Assistive device: Walker-rolling Max distance: 150'   Walk 10 feet activity   Assist     Assist level: Contact Guard/Touching assist Assistive device: Walker-rolling   Walk 50 feet activity   Assist    Assist level: Total Assistance - Patient < 25% Assistive device: Walker-rolling    Walk 150 feet activity   Assist    Assist level: Total Assistance - Patient < 25% Assistive device: Walker-rolling    Walk 10 feet on uneven surface  activity   Assist     Assist level: Total Assistance - Patient < 25% Assistive device: Walker-rolling   Wheelchair     Assist Is the patient using a wheelchair?: No             Wheelchair 50 feet with 2 turns activity    Assist            Wheelchair 150 feet activity     Assist          Blood pressure 121/73, pulse 89, temperature 98.5 F (36.9 C), temperature source Oral, resp. rate 18, height 5\' 5"  (1.651 m), weight 86.2 kg, last menstrual period 06/15/2022, SpO2 100 %.  Medical Problem List and Plan: 1. Functional deficits secondary to right iliopsoas abscess/right intrapelvic necrotizing fasciitis/right thigh necrotizing fasciitis.  Status post right thigh I&D as well as right pelvic I&D 06/18/2022 Pain related weakness vs RIght lumbar plexopathy              -patient may shower             -ELOS/Goals: 12/22             -Continue CIR therapies including PT, OT   2.   Antithrombotics: -DVT/anticoagulation:  Pharmaceutical: Heparin check vascular study             -antiplatelet therapy: N/A 3. Pain: continue Neurontin 300mg  qhs, hydrocodone as needed 4. Depression: continue Zoloft 100 mg nightly.  Provide emotional support             -antipsychotic agents: N/A 5. Neuropsych/cognition: This patient is capable of making decisions on her own behalf. 6. Skin/Wound Care: Routine skin checks  -right thigh and inguinal wounds: - Wound vac in place. Appreciate woc RN help -12/21 will go home with vac, general surgery to follow 7. Fluids/Electrolytes/Nutrition: Routine in and outs with follow-up chemistries   -  labs with HD per nephrology 8.  Normocytic anemia.  Follow-up CBC.  Continue Aranesp 9.  AKI on CKD stage IV.  AKI likely due to ischemic ATN in the setting of sepsis physiology.  Follow-up renal services  HyperK+ lokelma, mgt per nephrology--appreciate help, they signed off  -pt better aware of renal diet  12/21- potassium still 5.4, continue lokelma until K+ is 4.0   -recheck bmet 12/22   -will need f/u bmet after discharge 10.  Diabetes mellitus.  Hemoglobin A1c 5.4.  SSI  -12/20 reasonable control  CBG (last 3)  Recent Labs    07/06/22 1627 07/06/22 2104 07/07/22 0618  GLUCAP 112* 151* 100*    11.  Oral candidiasis.  Completing course of Diflucan 12.  Hypertension.  Norvasc 10 mg daily.    -12/15 controlled  -12/21 controlled 13.  Morbid obesity.  BMI 33.57. Follow-up dietary 14. Asterixis appears improved     LOS: 8 days A FACE TO FACE EVALUATION WAS PERFORMED  Meredith Staggers 07/07/2022, 7:51 AM

## 2022-07-07 NOTE — Progress Notes (Signed)
Inpatient Rehabilitation Care Coordinator Discharge Note   Patient Details  Name: Jacqueline [name] MRN: [id number] Date of Birth: 03-Dec-1980   Discharge location: D/c to home  Length of Stay: 8 days  Discharge activity level: Supervision  Home/community participation: Limited  Patient response VU:YEBXID Literacy - How often do you need to have someone help you when you read instructions, pamphlets, or other written material from your doctor or pharmacy?: Never  Patient response HW:YSHUOH Isolation - How often do you feel lonely or isolated from those around you?: Never  Services provided included: MD, RD, PT, OT, RN, CM, TR, Pharmacy, Neuropsych, SW  Financial Services:  Charity fundraiser Utilized: [employer] (primary), [place] Medicaid [employer] (Secondary)  Choices offered to/list presented to: Yes  Follow-up services arranged:  [facility], DME Home Health Agency: [facility] for HHPT/OT/SN    DME : [facility] for w/c, RW and 3in1 BSC; [facility]- wound vac    Patient response to transportation need: Is the patient able to respond to transportation needs?: Yes In the past 12 months, has lack of transportation kept you from medical appointments or from getting medications?: No In the past 12 months, has lack of transportation kept you from meetings, work, or from getting things needed for daily living?: No    Comments (or additional information):  Patient/Family verbalized understanding of follow-up arrangements:  Yes  Individual responsible for coordination of the follow-up plan: contact pt  Confirmed correct DME delivered: Rana Snare 07/07/2022    Rana Snare

## 2022-07-07 NOTE — Progress Notes (Signed)
Occupational Therapy Discharge Summary  Patient Details  Name: Nyeisha Goodall MRN: [id number] Date of Birth: 14-Mar-1981  Date of Discharge from [facility] service:July 08, 2022   Patient has met 9 of 9 long term goals due to improved activity tolerance, improved balance, postural control, and ability to compensate for deficits.  Patient to discharge at overall Modified Independent/supervision level.  Patient's care partner is independent to provide the necessary physical assistance at discharge.    Reasons goals not met: n/a  Recommendation:  Patient will benefit from ongoing skilled OT services in home health setting to continue to advance functional skills in the area of BADL.  Equipment: RW, wc. 3-in-1 BSC  Reasons for discharge: treatment goals met and discharge from hospital  Patient/family agrees with progress made and goals achieved: Yes  OT Discharge Precautions/Restrictions  Precautions Precautions: Fall Precaution Comments: RLE buckling Restrictions Weight Bearing Restrictions: Yes RLE Weight Bearing: Weight bearing as tolerated Pain  Pt reports pain is controlled ADL ADL Eating: Independent Grooming: Independent Upper Body Bathing: Independent Lower Body Bathing: Modified independent Upper Body Dressing: Independent Lower Body Dressing: Modified independent Toileting: Modified independent Toilet Transfer: Modified independent Social research officer, government: Close supervision Vision Baseline Vision/History: 1 Wears glasses Patient Visual Report: No change from baseline Perception  Perception: Within Functional Limits Praxis Praxis: Intact Cognition Cognition Overall Cognitive Status: Within Functional Limits for tasks assessed Arousal/Alertness: Awake/alert Orientation Level: Person;Place;Situation Person: Oriented Place: Oriented Situation: Oriented Memory: Appears intact Awareness: Appears intact Problem Solving: Appears intact Safety/Judgment: Appears  intact Brief Interview for Mental Status (BIMS) Repetition of Three Words (First Attempt): 3 Temporal Orientation: Year: Correct Temporal Orientation: Month: Accurate within 5 days Temporal Orientation: Day: Correct Recall: "Sock": Yes, no cue required Recall: "Blue": Yes, no cue required Recall: "Bed": Yes, no cue required BIMS Summary Score: 15 Sensation Sensation Light Touch: Impaired Detail Light Touch Impaired Details: Impaired RLE Additional Comments: R thigh nearly absent sensation, Sensation intact in lower leg, R foot impaired due to baseline neuropathy Coordination Gross Motor Movements are Fluid and Coordinated: Yes Fine Motor Movements are Fluid and Coordinated: Yes Motor  Motor Motor: Within Functional Limits Motor - Discharge Observations: Weakness in RLE improving. R quad most impaired. Mobility  Bed Mobility Bed Mobility: Supine to Sit;Sit to Supine Supine to Sit: Independent with assistive device Sit to Supine: Independent with assistive device Transfers Sit to Stand: Independent with assistive device Stand to Sit: Independent with assistive device  Trunk/Postural Assessment  Cervical Assessment Cervical Assessment: Within Functional Limits Thoracic Assessment Thoracic Assessment: Within Functional Limits Lumbar Assessment Lumbar Assessment: Within Functional Limits Postural Control Postural Control: Deficits on evaluation (Protective Responses slightly impaired. Improved from eval.)  Balance Balance Balance Assessed: Yes Static Sitting Balance Static Sitting - Balance Support: Feet supported Static Sitting - Level of Assistance: 7: Independent Dynamic Sitting Balance Dynamic Sitting - Balance Support: Feet supported Dynamic Sitting - Level of Assistance: 7: Independent Static Standing Balance Static Standing - Balance Support: During functional activity Static Standing - Level of Assistance: 6: Modified independent (Device/Increase time) Dynamic  Standing Balance Dynamic Standing - Balance Support: During functional activity Dynamic Standing - Level of Assistance: 5: Stand by assistance Extremity/Trunk Assessment RUE Assessment RUE Assessment: Within Functional Limits LUE Assessment LUE Assessment: Within Functional Limits   Daneen Schick Takeshia Wenk 07/07/2022, 1:04 PM

## 2022-07-08 ENCOUNTER — Other Ambulatory Visit (HOSPITAL_COMMUNITY): Payer: Self-pay

## 2022-07-08 LAB — RENAL FUNCTION PANEL
Albumin: 1.6 g/dL — ABNORMAL LOW (ref 3.5–5.0)
Anion gap: 10 (ref 5–15)
BUN: 76 mg/dL — ABNORMAL HIGH (ref 6–20)
CO2: 23 mmol/L (ref 22–32)
Calcium: 7.7 mg/dL — ABNORMAL LOW (ref 8.9–10.3)
Chloride: 111 mmol/L (ref 98–111)
Creatinine, Ser: 4.94 mg/dL — ABNORMAL HIGH (ref 0.44–1.00)
GFR, Estimated: 11 mL/min — ABNORMAL LOW (ref >60)
Glucose, Bld: 93 mg/dL (ref 70–99)
Phosphorus: 5.2 mg/dL — ABNORMAL HIGH (ref 2.5–4.6)
Potassium: 5.5 mmol/L — ABNORMAL HIGH (ref 3.5–5.1)
Sodium: 144 mmol/L (ref 135–145)

## 2022-07-08 LAB — GLUCOSE, CAPILLARY: Glucose-Capillary: 114 mg/dL — ABNORMAL HIGH (ref 70–99)

## 2022-07-08 NOTE — Progress Notes (Signed)
Inpatient Rehabilitation Discharge Medication Review by a Pharmacist  A complete drug regimen review was completed for this patient to identify any potential clinically significant medication issues.  High Risk Drug Classes Is patient taking? Indication by Medication  Antipsychotic No   Anticoagulant No   Antibiotic No   Opioid Yes Vicodin-prn pain  Antiplatelet No   Hypoglycemics/insulin No   Vasoactive Medication Yes Amlodipine, hydralazine, Lasix- bp and diuretic  Chemotherapy No   Other Yes Tylenol-pain Calcitriol, sodium bicarb-CKD-BMD gabapentin- pain Protonix-GERD Zoloft- depression Voltaren gel-pain Nystatin-candida inner thighs Lokelma- hyperkalemia     Type of Medication Issue Identified Description of Issue Recommendation(s)  Drug Interaction(s) (clinically significant)     Duplicate Therapy     Allergy     No Medication Administration End Date     Incorrect Dose     Additional Drug Therapy Needed     Significant med changes from prior encounter (inform family/care partners about these prior to discharge). New Lokelma and lasix Educate patient and family as appropriate  Other       Clinically significant medication issues were identified that warrant physician communication and completion of prescribed/recommended actions by midnight of the next day:  No  Time spent performing this drug regimen review (minutes):  20 minutes  Thank you for allowing [place] to participate in this patients care. Jens Som, PharmD 07/08/2022 9:24 AM  **Pharmacist phone directory can be found on [email or web address] listed under [place]**

## 2022-07-08 NOTE — Progress Notes (Signed)
PROGRESS NOTE   Subjective/Complaints: No new complaints this morning Pain is well controlled Excited to go home today Wound vac placed  ROS: Patient denies fever, rash, sore throat, blurred vision, dizziness, nausea, vomiting, diarrhea, cough, shortness of breath or chest pain,  headache, or mood change, incisional site pain  Objective:   No results found. No results for input(s): "WBC", "HGB", "HCT", "PLT" in the last 72 hours.  Recent Labs    07/07/22 0542 07/08/22 0512  NA 141 144  K 5.4* 5.5*  CL 112* 111  CO2 24 23  GLUCOSE 115* 93  BUN 75* 76*  CREATININE 5.03* 4.94*  CALCIUM 7.4* 7.7*    Intake/Output Summary (Last 24 hours) at 07/08/2022 0934 Last data filed at 07/07/2022 2005 Gross per 24 hour  Intake 360 ml  Output 500 ml  Net -140 ml        Physical Exam: Vital Signs Blood pressure 130/69, pulse 87, temperature 98.2 F (36.8 C), temperature source Oral, resp. rate 16, height 5\' 5"  (1.651 m), weight 86.2 kg, last menstrual period 06/15/2022, SpO2 100 %. Gen: no distress, normal appearing HEENT: oral mucosa pink and moist, NCAT Cardio: Reg rate Chest: normal effort, normal rate of breathing Abd: soft, non-distended Ext: no edema Psych: pleasant, normal affect Skin: Wound vac to right thigh- and inguinal area. Sealed, moderate serosang discharge in cannister. Neuro:  Alert and oriented x 3. Normal insight and awareness. Intact Memory. Normal language and speech. Cranial nerve exam unremarkable. UE motor 5/5. RLE limited by open wound, discomfort.3- knee ext  ADF/APF 4/5.  Musculoskeletal: No pain with knee or ankle movement    Assessment/Plan: 1. Functional deficits which require 3+ hours per day of interdisciplinary therapy in a comprehensive inpatient rehab setting. Physiatrist is providing close team supervision and 24 hour management of active medical problems listed below. Physiatrist and  rehab team continue to assess barriers to discharge/monitor patient progress toward functional and medical goals  Care Tool:  Bathing    Body parts bathed by patient: Right arm, Left arm, Chest, Abdomen, Front perineal area, Buttocks, Right upper leg, Left upper leg, Right lower leg, Left lower leg, Face   Body parts bathed by helper: Right lower leg, Left lower leg, Left upper leg, Right upper leg, Buttocks     Bathing assist Assist Level: Supervision/Verbal cueing     Upper Body Dressing/Undressing Upper body dressing   What is the patient wearing?: Pull over shirt    Upper body assist Assist Level: Independent    Lower Body Dressing/Undressing Lower body dressing      What is the patient wearing?: Pants, Underwear/pull up     Lower body assist Assist for lower body dressing: Independent with assitive device     Toileting Toileting    Toileting assist Assist for toileting: Independent with assistive device     Transfers Chair/bed transfer  Transfers assist     Chair/bed transfer assist level: Independent with assistive device     Locomotion Ambulation   Ambulation assist      Assist level: Supervision/Verbal cueing Assistive device: Walker-rolling Max distance: >300'   Walk 10 feet activity   Assist  Assist level: Supervision/Verbal cueing Assistive device: Walker-rolling   Walk 50 feet activity   Assist    Assist level: Supervision/Verbal cueing Assistive device: Walker-rolling    Walk 150 feet activity   Assist    Assist level: Supervision/Verbal cueing Assistive device: Walker-rolling    Walk 10 feet on uneven surface  activity   Assist     Assist level: Supervision/Verbal cueing Assistive device: Walker-rolling   Wheelchair     Assist Is the patient using a wheelchair?: No             Wheelchair 50 feet with 2 turns activity    Assist            Wheelchair 150 feet activity     Assist           Blood pressure 130/69, pulse 87, temperature 98.2 F (36.8 C), temperature source Oral, resp. rate 16, height 5\' 5"  (1.651 m), weight 86.2 kg, last menstrual period 06/15/2022, SpO2 100 %.  Medical Problem List and Plan: 1. Functional deficits secondary to right iliopsoas abscess/right intrapelvic necrotizing fasciitis/right thigh necrotizing fasciitis.  Status post right thigh I&D as well as right pelvic I&D 06/18/2022 Pain related weakness vs RIght lumbar plexopathy              -patient may shower             -ELOS/Goals: 12/22             d/c home today 2.  Antithrombotics: -DVT/anticoagulation:  Pharmaceutical: Heparin check vascular study             -antiplatelet therapy: N/A 3. Pain: continue Neurontin 300mg  qhs, hydrocodone as needed 4. Depression: continue Zoloft 100 mg nightly.  Provide emotional support             -antipsychotic agents: N/A 5. Neuropsych/cognition: This patient is capable of making decisions on her own behalf. 6. Skin/Wound Care: Routine skin checks  -right thigh and inguinal wounds: - Wound vac in place. Appreciate woc RN help -12/21 will go home with vac, general surgery to follow, replaced 12/22, f/u with nursing now regarding air leaking 7. Fluids/Electrolytes/Nutrition: Routine in and outs with follow-up chemistries   -labs with HD per nephrology 8.  Normocytic anemia.  Follow-up CBC.  Continue Aranesp 9.  AKI on CKD stage IV.  AKI likely due to ischemic ATN in the setting of sepsis physiology.  Follow-up renal services  HyperK+ lokelma, mgt per nephrology--appreciate help, they signed off  -pt better aware of renal diet  12/21- potassium still 5.4, continue lokelma until K+ is 4.0 12/22: continues to be elevated, continue Lokelma 10.  Diabetes mellitus.  Hemoglobin A1c 5.4.  SSI  -12/20 reasonable control  CBG (last 3)  Recent Labs    07/07/22 1651 07/07/22 2208 07/08/22 0554  GLUCAP 123* 194* 114*    11.  Oral candidiasis.   Completing course of Diflucan 12.  Hypertension.  Norvasc 10 mg daily.    -12/15 controlled  -12/21 controlled 13.  Morbid obesity.  BMI 33.57. Follow-up dietary 14. Asterixis appears improved      >30 minutes spent in discharge of patient including review of medications and follow-up appointments, physical examination, and in answering all patient's questions   LOS: 9 days A FACE TO FACE EVALUATION WAS [place] 07/08/2022, 9:34 AM

## 2022-07-08 NOTE — Progress Notes (Signed)
INPATIENT REHABILITATION DISCHARGE NOTE   Discharge instructions by: home  Verbalized understanding:yes  Skin care/Wound care healing? Wound vac  Pain:3/10  IV's:taken out before d/c  Tubes/Drains:wound vac  O2:none  Safety instructions:done  Patient belongings:done  Discharged OF:[facility]  Discharged via:car  Notes:pt with husband       Glenis Smoker RNC,BSN, WTA

## 2022-07-08 NOTE — Consult Note (Signed)
Reason for Consult: Changed NPWT to right thigh and right inguinal wounds. Difficult to obtain a seal related to skin folds surrounding.  Wound type:Full thickness  Pressure Injury POA: N/A Measurement: Right thigh and right inguinal  Wound bed: red, moist Drainage (amount, consistency, odor)  moderate amount yellow drainage Periwound: intact Dressing procedure/placement/frequency: Wounds cleansed with NS, dried. Skin barrier rings used to encircle wounds to enhance seal. White foam (1 piece) placed into right thigh wound, topped with 1 piece of black foam. One piece of black foam used to fill right inguinal wound. Drape applied, dressings attached to 173mmHg continuous negative pressure using a "Y" connector. An immediate seal is achieved. Patient premedicated prior to NPWT change,  tolerated procedure well. We connected patient to portable home machine and discussed trouble shooting for leaks and changing canisters with husband at bedside. Patient plans to discharge today with home health.    Please re-consult if further assistance is needed.  Thank-you,  Julien Girt MSN, RN, CWOCN, CWCN-AP, CNS

## 2022-07-08 NOTE — Progress Notes (Signed)
Completed/met nursing goals resolved and education for nursing closed out and completed.

## 2022-07-12 ENCOUNTER — Other Ambulatory Visit: Payer: Self-pay

## 2022-07-12 ENCOUNTER — Emergency Department (HOSPITAL_COMMUNITY)
Admission: EM | Admit: 2022-07-12 | Discharge: 2022-07-12 | Discharge status: Against Medical Advice | Payer: BC Managed Care – PPO | Attending: Emergency Medicine | Admitting: Emergency Medicine

## 2022-07-12 DIAGNOSIS — Z5321 Procedure and treatment not carried out due to patient leaving prior to being seen by health care provider: Secondary | ICD-10-CM | POA: Insufficient documentation

## 2022-07-12 DIAGNOSIS — Z4800 Encounter for change or removal of nonsurgical wound dressing: Secondary | ICD-10-CM | POA: Diagnosis not present

## 2022-07-12 LAB — CBC WITH DIFFERENTIAL/PLATELET
Abs Immature Granulocytes: 0.03 10*3/uL (ref 0.00–0.07)
Basophils Absolute: 0.1 10*3/uL (ref 0.0–0.1)
Basophils Relative: 1 %
Eosinophils Absolute: 0.5 10*3/uL (ref 0.0–0.5)
Eosinophils Relative: 6 %
HCT: 32.6 % — ABNORMAL LOW (ref 36.0–46.0)
Hemoglobin: 9.8 g/dL — ABNORMAL LOW (ref 12.0–15.0)
Immature Granulocytes: 0 %
Lymphocytes Relative: 26 %
Lymphs Abs: 2.3 10*3/uL (ref 0.7–4.0)
MCH: 30.3 pg (ref 26.0–34.0)
MCHC: 30.1 g/dL (ref 30.0–36.0)
MCV: 100.9 fL — ABNORMAL HIGH (ref 80.0–100.0)
Monocytes Absolute: 0.9 10*3/uL (ref 0.1–1.0)
Monocytes Relative: 11 %
Neutro Abs: 5 10*3/uL (ref 1.7–7.7)
Neutrophils Relative %: 56 %
Platelets: 486 10*3/uL — ABNORMAL HIGH (ref 150–400)
RBC: 3.23 MIL/uL — ABNORMAL LOW (ref 3.87–5.11)
RDW: 18.8 % — ABNORMAL HIGH (ref 11.5–15.5)
WBC: 8.8 10*3/uL (ref 4.0–10.5)
nRBC: 0 % (ref 0.0–0.2)

## 2022-07-12 LAB — BASIC METABOLIC PANEL
Anion gap: 10 (ref 5–15)
BUN: 76 mg/dL — ABNORMAL HIGH (ref 6–20)
CO2: 18 mmol/L — ABNORMAL LOW (ref 22–32)
Calcium: 7.5 mg/dL — ABNORMAL LOW (ref 8.9–10.3)
Chloride: 114 mmol/L — ABNORMAL HIGH (ref 98–111)
Creatinine, Ser: 5.09 mg/dL — ABNORMAL HIGH (ref 0.44–1.00)
GFR, Estimated: 10 mL/min — ABNORMAL LOW (ref >60)
Glucose, Bld: 97 mg/dL (ref 70–99)
Potassium: 5 mmol/L (ref 3.5–5.1)
Sodium: 142 mmol/L (ref 135–145)

## 2022-07-12 LAB — PREGNANCY, URINE: Preg Test, Ur: NEGATIVE

## 2022-07-12 NOTE — ED Notes (Signed)
Patient left the ER.  

## 2022-07-12 NOTE — ED Triage Notes (Signed)
Patient reports malodorous wound drainage from her right abdominal wound , she has a wound vac , no fever .

## 2022-07-13 DIAGNOSIS — Z8711 Personal history of peptic ulcer disease: Secondary | ICD-10-CM | POA: Diagnosis not present

## 2022-07-13 DIAGNOSIS — I130 Hypertensive heart and chronic kidney disease with heart failure and stage 1 through stage 4 chronic kidney disease, or unspecified chronic kidney disease: Secondary | ICD-10-CM | POA: Diagnosis not present

## 2022-07-13 DIAGNOSIS — Z4801 Encounter for change or removal of surgical wound dressing: Secondary | ICD-10-CM | POA: Diagnosis not present

## 2022-07-13 DIAGNOSIS — G4730 Sleep apnea, unspecified: Secondary | ICD-10-CM | POA: Diagnosis not present

## 2022-07-13 DIAGNOSIS — Z9884 Bariatric surgery status: Secondary | ICD-10-CM | POA: Diagnosis not present

## 2022-07-13 DIAGNOSIS — E1122 Type 2 diabetes mellitus with diabetic chronic kidney disease: Secondary | ICD-10-CM | POA: Diagnosis not present

## 2022-07-13 DIAGNOSIS — E875 Hyperkalemia: Secondary | ICD-10-CM | POA: Diagnosis not present

## 2022-07-13 DIAGNOSIS — D631 Anemia in chronic kidney disease: Secondary | ICD-10-CM | POA: Diagnosis not present

## 2022-07-13 DIAGNOSIS — I509 Heart failure, unspecified: Secondary | ICD-10-CM | POA: Diagnosis not present

## 2022-07-13 DIAGNOSIS — Z4789 Encounter for other orthopedic aftercare: Secondary | ICD-10-CM | POA: Diagnosis not present

## 2022-07-13 DIAGNOSIS — Z6830 Body mass index (BMI) 30.0-30.9, adult: Secondary | ICD-10-CM | POA: Diagnosis not present

## 2022-07-13 DIAGNOSIS — F4323 Adjustment disorder with mixed anxiety and depressed mood: Secondary | ICD-10-CM | POA: Diagnosis not present

## 2022-07-13 DIAGNOSIS — N179 Acute kidney failure, unspecified: Secondary | ICD-10-CM | POA: Diagnosis not present

## 2022-07-13 DIAGNOSIS — N184 Chronic kidney disease, stage 4 (severe): Secondary | ICD-10-CM | POA: Diagnosis not present

## 2022-07-13 DIAGNOSIS — J42 Unspecified chronic bronchitis: Secondary | ICD-10-CM | POA: Diagnosis not present

## 2022-07-15 DIAGNOSIS — F4323 Adjustment disorder with mixed anxiety and depressed mood: Secondary | ICD-10-CM | POA: Diagnosis not present

## 2022-07-15 DIAGNOSIS — Z9884 Bariatric surgery status: Secondary | ICD-10-CM | POA: Diagnosis not present

## 2022-07-15 DIAGNOSIS — E1122 Type 2 diabetes mellitus with diabetic chronic kidney disease: Secondary | ICD-10-CM | POA: Diagnosis not present

## 2022-07-15 DIAGNOSIS — E875 Hyperkalemia: Secondary | ICD-10-CM | POA: Diagnosis not present

## 2022-07-15 DIAGNOSIS — Z6830 Body mass index (BMI) 30.0-30.9, adult: Secondary | ICD-10-CM | POA: Diagnosis not present

## 2022-07-15 DIAGNOSIS — J42 Unspecified chronic bronchitis: Secondary | ICD-10-CM | POA: Diagnosis not present

## 2022-07-15 DIAGNOSIS — N179 Acute kidney failure, unspecified: Secondary | ICD-10-CM | POA: Diagnosis not present

## 2022-07-15 DIAGNOSIS — Z8711 Personal history of peptic ulcer disease: Secondary | ICD-10-CM | POA: Diagnosis not present

## 2022-07-15 DIAGNOSIS — D631 Anemia in chronic kidney disease: Secondary | ICD-10-CM | POA: Diagnosis not present

## 2022-07-15 DIAGNOSIS — G4730 Sleep apnea, unspecified: Secondary | ICD-10-CM | POA: Diagnosis not present

## 2022-07-15 DIAGNOSIS — I130 Hypertensive heart and chronic kidney disease with heart failure and stage 1 through stage 4 chronic kidney disease, or unspecified chronic kidney disease: Secondary | ICD-10-CM | POA: Diagnosis not present

## 2022-07-15 DIAGNOSIS — N184 Chronic kidney disease, stage 4 (severe): Secondary | ICD-10-CM | POA: Diagnosis not present

## 2022-07-15 DIAGNOSIS — Z4789 Encounter for other orthopedic aftercare: Secondary | ICD-10-CM | POA: Diagnosis not present

## 2022-07-15 DIAGNOSIS — I509 Heart failure, unspecified: Secondary | ICD-10-CM | POA: Diagnosis not present

## 2022-07-15 DIAGNOSIS — Z4801 Encounter for change or removal of surgical wound dressing: Secondary | ICD-10-CM | POA: Diagnosis not present

## 2022-07-18 DIAGNOSIS — Z9884 Bariatric surgery status: Secondary | ICD-10-CM | POA: Diagnosis not present

## 2022-07-18 DIAGNOSIS — J42 Unspecified chronic bronchitis: Secondary | ICD-10-CM | POA: Diagnosis not present

## 2022-07-18 DIAGNOSIS — Z4801 Encounter for change or removal of surgical wound dressing: Secondary | ICD-10-CM | POA: Diagnosis not present

## 2022-07-18 DIAGNOSIS — N179 Acute kidney failure, unspecified: Secondary | ICD-10-CM | POA: Diagnosis not present

## 2022-07-18 DIAGNOSIS — I509 Heart failure, unspecified: Secondary | ICD-10-CM | POA: Diagnosis not present

## 2022-07-18 DIAGNOSIS — F4323 Adjustment disorder with mixed anxiety and depressed mood: Secondary | ICD-10-CM | POA: Diagnosis not present

## 2022-07-18 DIAGNOSIS — E1122 Type 2 diabetes mellitus with diabetic chronic kidney disease: Secondary | ICD-10-CM | POA: Diagnosis not present

## 2022-07-18 DIAGNOSIS — G4730 Sleep apnea, unspecified: Secondary | ICD-10-CM | POA: Diagnosis not present

## 2022-07-18 DIAGNOSIS — E875 Hyperkalemia: Secondary | ICD-10-CM | POA: Diagnosis not present

## 2022-07-18 DIAGNOSIS — Z8711 Personal history of peptic ulcer disease: Secondary | ICD-10-CM | POA: Diagnosis not present

## 2022-07-18 DIAGNOSIS — I130 Hypertensive heart and chronic kidney disease with heart failure and stage 1 through stage 4 chronic kidney disease, or unspecified chronic kidney disease: Secondary | ICD-10-CM | POA: Diagnosis not present

## 2022-07-18 DIAGNOSIS — Z6830 Body mass index (BMI) 30.0-30.9, adult: Secondary | ICD-10-CM | POA: Diagnosis not present

## 2022-07-18 DIAGNOSIS — T8189XA Other complications of procedures, not elsewhere classified, initial encounter: Secondary | ICD-10-CM | POA: Diagnosis not present

## 2022-07-18 DIAGNOSIS — N184 Chronic kidney disease, stage 4 (severe): Secondary | ICD-10-CM | POA: Diagnosis not present

## 2022-07-18 DIAGNOSIS — D631 Anemia in chronic kidney disease: Secondary | ICD-10-CM | POA: Diagnosis not present

## 2022-07-18 DIAGNOSIS — Z4789 Encounter for other orthopedic aftercare: Secondary | ICD-10-CM | POA: Diagnosis not present

## 2022-07-19 DIAGNOSIS — T8189XA Other complications of procedures, not elsewhere classified, initial encounter: Secondary | ICD-10-CM | POA: Diagnosis not present

## 2022-07-20 DIAGNOSIS — Z9884 Bariatric surgery status: Secondary | ICD-10-CM | POA: Diagnosis not present

## 2022-07-20 DIAGNOSIS — M726 Necrotizing fasciitis: Secondary | ICD-10-CM | POA: Diagnosis not present

## 2022-07-20 DIAGNOSIS — Z8711 Personal history of peptic ulcer disease: Secondary | ICD-10-CM | POA: Diagnosis not present

## 2022-07-20 DIAGNOSIS — D631 Anemia in chronic kidney disease: Secondary | ICD-10-CM | POA: Diagnosis not present

## 2022-07-20 DIAGNOSIS — F4323 Adjustment disorder with mixed anxiety and depressed mood: Secondary | ICD-10-CM | POA: Diagnosis not present

## 2022-07-20 DIAGNOSIS — L7682 Other postprocedural complications of skin and subcutaneous tissue: Secondary | ICD-10-CM | POA: Diagnosis not present

## 2022-07-20 DIAGNOSIS — N184 Chronic kidney disease, stage 4 (severe): Secondary | ICD-10-CM | POA: Diagnosis not present

## 2022-07-20 DIAGNOSIS — I130 Hypertensive heart and chronic kidney disease with heart failure and stage 1 through stage 4 chronic kidney disease, or unspecified chronic kidney disease: Secondary | ICD-10-CM | POA: Diagnosis not present

## 2022-07-20 DIAGNOSIS — G4730 Sleep apnea, unspecified: Secondary | ICD-10-CM | POA: Diagnosis not present

## 2022-07-20 DIAGNOSIS — Z4801 Encounter for change or removal of surgical wound dressing: Secondary | ICD-10-CM | POA: Diagnosis not present

## 2022-07-20 DIAGNOSIS — T8189XA Other complications of procedures, not elsewhere classified, initial encounter: Secondary | ICD-10-CM | POA: Diagnosis not present

## 2022-07-20 DIAGNOSIS — E875 Hyperkalemia: Secondary | ICD-10-CM | POA: Diagnosis not present

## 2022-07-20 DIAGNOSIS — N179 Acute kidney failure, unspecified: Secondary | ICD-10-CM | POA: Diagnosis not present

## 2022-07-20 DIAGNOSIS — I509 Heart failure, unspecified: Secondary | ICD-10-CM | POA: Diagnosis not present

## 2022-07-20 DIAGNOSIS — J42 Unspecified chronic bronchitis: Secondary | ICD-10-CM | POA: Diagnosis not present

## 2022-07-20 DIAGNOSIS — Z6830 Body mass index (BMI) 30.0-30.9, adult: Secondary | ICD-10-CM | POA: Diagnosis not present

## 2022-07-20 DIAGNOSIS — Z4789 Encounter for other orthopedic aftercare: Secondary | ICD-10-CM | POA: Diagnosis not present

## 2022-07-20 DIAGNOSIS — E1122 Type 2 diabetes mellitus with diabetic chronic kidney disease: Secondary | ICD-10-CM | POA: Diagnosis not present

## 2022-07-21 DIAGNOSIS — E875 Hyperkalemia: Secondary | ICD-10-CM | POA: Diagnosis not present

## 2022-07-21 DIAGNOSIS — F4323 Adjustment disorder with mixed anxiety and depressed mood: Secondary | ICD-10-CM | POA: Diagnosis not present

## 2022-07-21 DIAGNOSIS — Z4789 Encounter for other orthopedic aftercare: Secondary | ICD-10-CM | POA: Diagnosis not present

## 2022-07-21 DIAGNOSIS — I130 Hypertensive heart and chronic kidney disease with heart failure and stage 1 through stage 4 chronic kidney disease, or unspecified chronic kidney disease: Secondary | ICD-10-CM | POA: Diagnosis not present

## 2022-07-21 DIAGNOSIS — I509 Heart failure, unspecified: Secondary | ICD-10-CM | POA: Diagnosis not present

## 2022-07-21 DIAGNOSIS — E1122 Type 2 diabetes mellitus with diabetic chronic kidney disease: Secondary | ICD-10-CM | POA: Diagnosis not present

## 2022-07-21 DIAGNOSIS — Z6830 Body mass index (BMI) 30.0-30.9, adult: Secondary | ICD-10-CM | POA: Diagnosis not present

## 2022-07-21 DIAGNOSIS — N179 Acute kidney failure, unspecified: Secondary | ICD-10-CM | POA: Diagnosis not present

## 2022-07-21 DIAGNOSIS — N184 Chronic kidney disease, stage 4 (severe): Secondary | ICD-10-CM | POA: Diagnosis not present

## 2022-07-21 DIAGNOSIS — D631 Anemia in chronic kidney disease: Secondary | ICD-10-CM | POA: Diagnosis not present

## 2022-07-21 DIAGNOSIS — Z8711 Personal history of peptic ulcer disease: Secondary | ICD-10-CM | POA: Diagnosis not present

## 2022-07-21 DIAGNOSIS — G4730 Sleep apnea, unspecified: Secondary | ICD-10-CM | POA: Diagnosis not present

## 2022-07-21 DIAGNOSIS — T8189XA Other complications of procedures, not elsewhere classified, initial encounter: Secondary | ICD-10-CM | POA: Diagnosis not present

## 2022-07-21 DIAGNOSIS — Z9884 Bariatric surgery status: Secondary | ICD-10-CM | POA: Diagnosis not present

## 2022-07-21 DIAGNOSIS — J42 Unspecified chronic bronchitis: Secondary | ICD-10-CM | POA: Diagnosis not present

## 2022-07-21 DIAGNOSIS — Z4801 Encounter for change or removal of surgical wound dressing: Secondary | ICD-10-CM | POA: Diagnosis not present

## 2022-07-22 ENCOUNTER — Encounter (HOSPITAL_COMMUNITY): Payer: BC Managed Care – PPO

## 2022-07-22 ENCOUNTER — Other Ambulatory Visit (HOSPITAL_COMMUNITY): Payer: Self-pay | Admitting: General Surgery

## 2022-07-22 DIAGNOSIS — Z4801 Encounter for change or removal of surgical wound dressing: Secondary | ICD-10-CM | POA: Diagnosis not present

## 2022-07-22 DIAGNOSIS — F4323 Adjustment disorder with mixed anxiety and depressed mood: Secondary | ICD-10-CM | POA: Diagnosis not present

## 2022-07-22 DIAGNOSIS — R2241 Localized swelling, mass and lump, right lower limb: Secondary | ICD-10-CM | POA: Diagnosis not present

## 2022-07-22 DIAGNOSIS — G4730 Sleep apnea, unspecified: Secondary | ICD-10-CM | POA: Diagnosis not present

## 2022-07-22 DIAGNOSIS — N179 Acute kidney failure, unspecified: Secondary | ICD-10-CM | POA: Diagnosis not present

## 2022-07-22 DIAGNOSIS — E875 Hyperkalemia: Secondary | ICD-10-CM | POA: Diagnosis not present

## 2022-07-22 DIAGNOSIS — T8189XA Other complications of procedures, not elsewhere classified, initial encounter: Secondary | ICD-10-CM | POA: Diagnosis not present

## 2022-07-22 DIAGNOSIS — D649 Anemia, unspecified: Secondary | ICD-10-CM | POA: Diagnosis not present

## 2022-07-22 DIAGNOSIS — Z6830 Body mass index (BMI) 30.0-30.9, adult: Secondary | ICD-10-CM | POA: Diagnosis not present

## 2022-07-22 DIAGNOSIS — I130 Hypertensive heart and chronic kidney disease with heart failure and stage 1 through stage 4 chronic kidney disease, or unspecified chronic kidney disease: Secondary | ICD-10-CM | POA: Diagnosis not present

## 2022-07-22 DIAGNOSIS — N184 Chronic kidney disease, stage 4 (severe): Secondary | ICD-10-CM | POA: Diagnosis not present

## 2022-07-22 DIAGNOSIS — D631 Anemia in chronic kidney disease: Secondary | ICD-10-CM | POA: Diagnosis not present

## 2022-07-22 DIAGNOSIS — E1122 Type 2 diabetes mellitus with diabetic chronic kidney disease: Secondary | ICD-10-CM | POA: Diagnosis not present

## 2022-07-22 DIAGNOSIS — Z758 Other problems related to medical facilities and other health care: Secondary | ICD-10-CM | POA: Diagnosis not present

## 2022-07-22 DIAGNOSIS — Z9884 Bariatric surgery status: Secondary | ICD-10-CM | POA: Diagnosis not present

## 2022-07-22 DIAGNOSIS — J42 Unspecified chronic bronchitis: Secondary | ICD-10-CM | POA: Diagnosis not present

## 2022-07-22 DIAGNOSIS — I509 Heart failure, unspecified: Secondary | ICD-10-CM | POA: Diagnosis not present

## 2022-07-22 DIAGNOSIS — Z8711 Personal history of peptic ulcer disease: Secondary | ICD-10-CM | POA: Diagnosis not present

## 2022-07-22 DIAGNOSIS — G8918 Other acute postprocedural pain: Secondary | ICD-10-CM | POA: Diagnosis not present

## 2022-07-22 DIAGNOSIS — R609 Edema, unspecified: Secondary | ICD-10-CM

## 2022-07-22 DIAGNOSIS — Z4789 Encounter for other orthopedic aftercare: Secondary | ICD-10-CM | POA: Diagnosis not present

## 2022-07-23 DIAGNOSIS — T8189XA Other complications of procedures, not elsewhere classified, initial encounter: Secondary | ICD-10-CM | POA: Diagnosis not present

## 2022-07-24 DIAGNOSIS — T8189XA Other complications of procedures, not elsewhere classified, initial encounter: Secondary | ICD-10-CM | POA: Diagnosis not present

## 2022-07-25 ENCOUNTER — Ambulatory Visit (HOSPITAL_COMMUNITY)
Admission: RE | Admit: 2022-07-25 | Discharge: 2022-07-25 | Disposition: A | Discharge status: Home / Self Care | Payer: BC Managed Care – PPO | Source: Ambulatory Visit | Attending: General Surgery | Admitting: General Surgery

## 2022-07-25 DIAGNOSIS — Z4801 Encounter for change or removal of surgical wound dressing: Secondary | ICD-10-CM | POA: Diagnosis not present

## 2022-07-25 DIAGNOSIS — Z6830 Body mass index (BMI) 30.0-30.9, adult: Secondary | ICD-10-CM | POA: Diagnosis not present

## 2022-07-25 DIAGNOSIS — T8189XA Other complications of procedures, not elsewhere classified, initial encounter: Secondary | ICD-10-CM | POA: Diagnosis not present

## 2022-07-25 DIAGNOSIS — R609 Edema, unspecified: Secondary | ICD-10-CM | POA: Insufficient documentation

## 2022-07-25 DIAGNOSIS — I509 Heart failure, unspecified: Secondary | ICD-10-CM | POA: Diagnosis not present

## 2022-07-25 DIAGNOSIS — F4323 Adjustment disorder with mixed anxiety and depressed mood: Secondary | ICD-10-CM | POA: Diagnosis not present

## 2022-07-25 DIAGNOSIS — J42 Unspecified chronic bronchitis: Secondary | ICD-10-CM | POA: Diagnosis not present

## 2022-07-25 DIAGNOSIS — Z9884 Bariatric surgery status: Secondary | ICD-10-CM | POA: Diagnosis not present

## 2022-07-25 DIAGNOSIS — E1122 Type 2 diabetes mellitus with diabetic chronic kidney disease: Secondary | ICD-10-CM | POA: Diagnosis not present

## 2022-07-25 DIAGNOSIS — E875 Hyperkalemia: Secondary | ICD-10-CM | POA: Diagnosis not present

## 2022-07-25 DIAGNOSIS — Z4789 Encounter for other orthopedic aftercare: Secondary | ICD-10-CM | POA: Diagnosis not present

## 2022-07-25 DIAGNOSIS — Z8711 Personal history of peptic ulcer disease: Secondary | ICD-10-CM | POA: Diagnosis not present

## 2022-07-25 DIAGNOSIS — D631 Anemia in chronic kidney disease: Secondary | ICD-10-CM | POA: Diagnosis not present

## 2022-07-25 DIAGNOSIS — I130 Hypertensive heart and chronic kidney disease with heart failure and stage 1 through stage 4 chronic kidney disease, or unspecified chronic kidney disease: Secondary | ICD-10-CM | POA: Diagnosis not present

## 2022-07-25 DIAGNOSIS — N179 Acute kidney failure, unspecified: Secondary | ICD-10-CM | POA: Diagnosis not present

## 2022-07-25 DIAGNOSIS — N184 Chronic kidney disease, stage 4 (severe): Secondary | ICD-10-CM | POA: Diagnosis not present

## 2022-07-25 DIAGNOSIS — G4730 Sleep apnea, unspecified: Secondary | ICD-10-CM | POA: Diagnosis not present

## 2022-07-25 NOTE — Progress Notes (Signed)
VASCULAR LAB    Right lower extremity venous duplex has been performed.  See CV proc for preliminary results.  Called report to Jackqulyn Livings, [facility], RVT 07/25/2022, 2:17 PM

## 2022-07-26 DIAGNOSIS — D631 Anemia in chronic kidney disease: Secondary | ICD-10-CM | POA: Diagnosis not present

## 2022-07-26 DIAGNOSIS — N189 Chronic kidney disease, unspecified: Secondary | ICD-10-CM | POA: Diagnosis not present

## 2022-07-26 DIAGNOSIS — T8189XA Other complications of procedures, not elsewhere classified, initial encounter: Secondary | ICD-10-CM | POA: Diagnosis not present

## 2022-07-26 DIAGNOSIS — N184 Chronic kidney disease, stage 4 (severe): Secondary | ICD-10-CM | POA: Diagnosis not present

## 2022-07-26 DIAGNOSIS — N2581 Secondary hyperparathyroidism of renal origin: Secondary | ICD-10-CM | POA: Diagnosis not present

## 2022-07-26 DIAGNOSIS — N179 Acute kidney failure, unspecified: Secondary | ICD-10-CM | POA: Diagnosis not present

## 2022-07-26 DIAGNOSIS — R809 Proteinuria, unspecified: Secondary | ICD-10-CM | POA: Diagnosis not present

## 2022-07-26 DIAGNOSIS — E1122 Type 2 diabetes mellitus with diabetic chronic kidney disease: Secondary | ICD-10-CM | POA: Diagnosis not present

## 2022-07-26 DIAGNOSIS — I129 Hypertensive chronic kidney disease with stage 1 through stage 4 chronic kidney disease, or unspecified chronic kidney disease: Secondary | ICD-10-CM | POA: Diagnosis not present

## 2022-07-27 DIAGNOSIS — G4730 Sleep apnea, unspecified: Secondary | ICD-10-CM | POA: Diagnosis not present

## 2022-07-27 DIAGNOSIS — Z6830 Body mass index (BMI) 30.0-30.9, adult: Secondary | ICD-10-CM | POA: Diagnosis not present

## 2022-07-27 DIAGNOSIS — N179 Acute kidney failure, unspecified: Secondary | ICD-10-CM | POA: Diagnosis not present

## 2022-07-27 DIAGNOSIS — D631 Anemia in chronic kidney disease: Secondary | ICD-10-CM | POA: Diagnosis not present

## 2022-07-27 DIAGNOSIS — I130 Hypertensive heart and chronic kidney disease with heart failure and stage 1 through stage 4 chronic kidney disease, or unspecified chronic kidney disease: Secondary | ICD-10-CM | POA: Diagnosis not present

## 2022-07-27 DIAGNOSIS — I509 Heart failure, unspecified: Secondary | ICD-10-CM | POA: Diagnosis not present

## 2022-07-27 DIAGNOSIS — J42 Unspecified chronic bronchitis: Secondary | ICD-10-CM | POA: Diagnosis not present

## 2022-07-27 DIAGNOSIS — N184 Chronic kidney disease, stage 4 (severe): Secondary | ICD-10-CM | POA: Diagnosis not present

## 2022-07-27 DIAGNOSIS — Z4789 Encounter for other orthopedic aftercare: Secondary | ICD-10-CM | POA: Diagnosis not present

## 2022-07-27 DIAGNOSIS — E1122 Type 2 diabetes mellitus with diabetic chronic kidney disease: Secondary | ICD-10-CM | POA: Diagnosis not present

## 2022-07-27 DIAGNOSIS — E875 Hyperkalemia: Secondary | ICD-10-CM | POA: Diagnosis not present

## 2022-07-27 DIAGNOSIS — Z4801 Encounter for change or removal of surgical wound dressing: Secondary | ICD-10-CM | POA: Diagnosis not present

## 2022-07-27 DIAGNOSIS — Z9884 Bariatric surgery status: Secondary | ICD-10-CM | POA: Diagnosis not present

## 2022-07-27 DIAGNOSIS — Z8711 Personal history of peptic ulcer disease: Secondary | ICD-10-CM | POA: Diagnosis not present

## 2022-07-27 DIAGNOSIS — F4323 Adjustment disorder with mixed anxiety and depressed mood: Secondary | ICD-10-CM | POA: Diagnosis not present

## 2022-07-27 DIAGNOSIS — T8189XA Other complications of procedures, not elsewhere classified, initial encounter: Secondary | ICD-10-CM | POA: Diagnosis not present

## 2022-07-28 DIAGNOSIS — I509 Heart failure, unspecified: Secondary | ICD-10-CM | POA: Diagnosis not present

## 2022-07-28 DIAGNOSIS — Z9884 Bariatric surgery status: Secondary | ICD-10-CM | POA: Diagnosis not present

## 2022-07-28 DIAGNOSIS — E875 Hyperkalemia: Secondary | ICD-10-CM | POA: Diagnosis not present

## 2022-07-28 DIAGNOSIS — J42 Unspecified chronic bronchitis: Secondary | ICD-10-CM | POA: Diagnosis not present

## 2022-07-28 DIAGNOSIS — Z4801 Encounter for change or removal of surgical wound dressing: Secondary | ICD-10-CM | POA: Diagnosis not present

## 2022-07-28 DIAGNOSIS — Z8711 Personal history of peptic ulcer disease: Secondary | ICD-10-CM | POA: Diagnosis not present

## 2022-07-28 DIAGNOSIS — F4323 Adjustment disorder with mixed anxiety and depressed mood: Secondary | ICD-10-CM | POA: Diagnosis not present

## 2022-07-28 DIAGNOSIS — Z4789 Encounter for other orthopedic aftercare: Secondary | ICD-10-CM | POA: Diagnosis not present

## 2022-07-28 DIAGNOSIS — E1122 Type 2 diabetes mellitus with diabetic chronic kidney disease: Secondary | ICD-10-CM | POA: Diagnosis not present

## 2022-07-28 DIAGNOSIS — N184 Chronic kidney disease, stage 4 (severe): Secondary | ICD-10-CM | POA: Diagnosis not present

## 2022-07-28 DIAGNOSIS — N179 Acute kidney failure, unspecified: Secondary | ICD-10-CM | POA: Diagnosis not present

## 2022-07-28 DIAGNOSIS — G4730 Sleep apnea, unspecified: Secondary | ICD-10-CM | POA: Diagnosis not present

## 2022-07-28 DIAGNOSIS — Z6830 Body mass index (BMI) 30.0-30.9, adult: Secondary | ICD-10-CM | POA: Diagnosis not present

## 2022-07-28 DIAGNOSIS — D631 Anemia in chronic kidney disease: Secondary | ICD-10-CM | POA: Diagnosis not present

## 2022-07-28 DIAGNOSIS — I130 Hypertensive heart and chronic kidney disease with heart failure and stage 1 through stage 4 chronic kidney disease, or unspecified chronic kidney disease: Secondary | ICD-10-CM | POA: Diagnosis not present

## 2022-07-28 DIAGNOSIS — T8189XA Other complications of procedures, not elsewhere classified, initial encounter: Secondary | ICD-10-CM | POA: Diagnosis not present

## 2022-07-29 DIAGNOSIS — D631 Anemia in chronic kidney disease: Secondary | ICD-10-CM | POA: Diagnosis not present

## 2022-07-29 DIAGNOSIS — Z4789 Encounter for other orthopedic aftercare: Secondary | ICD-10-CM | POA: Diagnosis not present

## 2022-07-29 DIAGNOSIS — F4323 Adjustment disorder with mixed anxiety and depressed mood: Secondary | ICD-10-CM | POA: Diagnosis not present

## 2022-07-29 DIAGNOSIS — I130 Hypertensive heart and chronic kidney disease with heart failure and stage 1 through stage 4 chronic kidney disease, or unspecified chronic kidney disease: Secondary | ICD-10-CM | POA: Diagnosis not present

## 2022-07-29 DIAGNOSIS — E875 Hyperkalemia: Secondary | ICD-10-CM | POA: Diagnosis not present

## 2022-07-29 DIAGNOSIS — Z8711 Personal history of peptic ulcer disease: Secondary | ICD-10-CM | POA: Diagnosis not present

## 2022-07-29 DIAGNOSIS — N179 Acute kidney failure, unspecified: Secondary | ICD-10-CM | POA: Diagnosis not present

## 2022-07-29 DIAGNOSIS — I509 Heart failure, unspecified: Secondary | ICD-10-CM | POA: Diagnosis not present

## 2022-07-29 DIAGNOSIS — E1122 Type 2 diabetes mellitus with diabetic chronic kidney disease: Secondary | ICD-10-CM | POA: Diagnosis not present

## 2022-07-29 DIAGNOSIS — Z4801 Encounter for change or removal of surgical wound dressing: Secondary | ICD-10-CM | POA: Diagnosis not present

## 2022-07-29 DIAGNOSIS — Z6830 Body mass index (BMI) 30.0-30.9, adult: Secondary | ICD-10-CM | POA: Diagnosis not present

## 2022-07-29 DIAGNOSIS — G4730 Sleep apnea, unspecified: Secondary | ICD-10-CM | POA: Diagnosis not present

## 2022-07-29 DIAGNOSIS — T8189XA Other complications of procedures, not elsewhere classified, initial encounter: Secondary | ICD-10-CM | POA: Diagnosis not present

## 2022-07-29 DIAGNOSIS — J42 Unspecified chronic bronchitis: Secondary | ICD-10-CM | POA: Diagnosis not present

## 2022-07-29 DIAGNOSIS — Z9884 Bariatric surgery status: Secondary | ICD-10-CM | POA: Diagnosis not present

## 2022-07-29 DIAGNOSIS — N184 Chronic kidney disease, stage 4 (severe): Secondary | ICD-10-CM | POA: Diagnosis not present

## 2022-07-30 DIAGNOSIS — T8189XA Other complications of procedures, not elsewhere classified, initial encounter: Secondary | ICD-10-CM | POA: Diagnosis not present

## 2022-07-31 DIAGNOSIS — T8189XA Other complications of procedures, not elsewhere classified, initial encounter: Secondary | ICD-10-CM | POA: Diagnosis not present

## 2022-08-01 DIAGNOSIS — I509 Heart failure, unspecified: Secondary | ICD-10-CM | POA: Diagnosis not present

## 2022-08-01 DIAGNOSIS — D631 Anemia in chronic kidney disease: Secondary | ICD-10-CM | POA: Diagnosis not present

## 2022-08-01 DIAGNOSIS — I130 Hypertensive heart and chronic kidney disease with heart failure and stage 1 through stage 4 chronic kidney disease, or unspecified chronic kidney disease: Secondary | ICD-10-CM | POA: Diagnosis not present

## 2022-08-01 DIAGNOSIS — F4323 Adjustment disorder with mixed anxiety and depressed mood: Secondary | ICD-10-CM | POA: Diagnosis not present

## 2022-08-01 DIAGNOSIS — T8189XA Other complications of procedures, not elsewhere classified, initial encounter: Secondary | ICD-10-CM | POA: Diagnosis not present

## 2022-08-01 DIAGNOSIS — Z4801 Encounter for change or removal of surgical wound dressing: Secondary | ICD-10-CM | POA: Diagnosis not present

## 2022-08-01 DIAGNOSIS — Z6830 Body mass index (BMI) 30.0-30.9, adult: Secondary | ICD-10-CM | POA: Diagnosis not present

## 2022-08-01 DIAGNOSIS — J42 Unspecified chronic bronchitis: Secondary | ICD-10-CM | POA: Diagnosis not present

## 2022-08-01 DIAGNOSIS — N179 Acute kidney failure, unspecified: Secondary | ICD-10-CM | POA: Diagnosis not present

## 2022-08-01 DIAGNOSIS — Z9884 Bariatric surgery status: Secondary | ICD-10-CM | POA: Diagnosis not present

## 2022-08-01 DIAGNOSIS — G4730 Sleep apnea, unspecified: Secondary | ICD-10-CM | POA: Diagnosis not present

## 2022-08-01 DIAGNOSIS — E875 Hyperkalemia: Secondary | ICD-10-CM | POA: Diagnosis not present

## 2022-08-01 DIAGNOSIS — Z4789 Encounter for other orthopedic aftercare: Secondary | ICD-10-CM | POA: Diagnosis not present

## 2022-08-01 DIAGNOSIS — E1122 Type 2 diabetes mellitus with diabetic chronic kidney disease: Secondary | ICD-10-CM | POA: Diagnosis not present

## 2022-08-01 DIAGNOSIS — N184 Chronic kidney disease, stage 4 (severe): Secondary | ICD-10-CM | POA: Diagnosis not present

## 2022-08-01 DIAGNOSIS — Z8711 Personal history of peptic ulcer disease: Secondary | ICD-10-CM | POA: Diagnosis not present

## 2022-08-02 DIAGNOSIS — T8189XA Other complications of procedures, not elsewhere classified, initial encounter: Secondary | ICD-10-CM | POA: Diagnosis not present

## 2022-08-03 DIAGNOSIS — G4730 Sleep apnea, unspecified: Secondary | ICD-10-CM | POA: Diagnosis not present

## 2022-08-03 DIAGNOSIS — Z4789 Encounter for other orthopedic aftercare: Secondary | ICD-10-CM | POA: Diagnosis not present

## 2022-08-03 DIAGNOSIS — I130 Hypertensive heart and chronic kidney disease with heart failure and stage 1 through stage 4 chronic kidney disease, or unspecified chronic kidney disease: Secondary | ICD-10-CM | POA: Diagnosis not present

## 2022-08-03 DIAGNOSIS — I509 Heart failure, unspecified: Secondary | ICD-10-CM | POA: Diagnosis not present

## 2022-08-03 DIAGNOSIS — N184 Chronic kidney disease, stage 4 (severe): Secondary | ICD-10-CM | POA: Diagnosis not present

## 2022-08-03 DIAGNOSIS — E875 Hyperkalemia: Secondary | ICD-10-CM | POA: Diagnosis not present

## 2022-08-03 DIAGNOSIS — N179 Acute kidney failure, unspecified: Secondary | ICD-10-CM | POA: Diagnosis not present

## 2022-08-03 DIAGNOSIS — E1122 Type 2 diabetes mellitus with diabetic chronic kidney disease: Secondary | ICD-10-CM | POA: Diagnosis not present

## 2022-08-03 DIAGNOSIS — Z6830 Body mass index (BMI) 30.0-30.9, adult: Secondary | ICD-10-CM | POA: Diagnosis not present

## 2022-08-03 DIAGNOSIS — Z9884 Bariatric surgery status: Secondary | ICD-10-CM | POA: Diagnosis not present

## 2022-08-03 DIAGNOSIS — Z8711 Personal history of peptic ulcer disease: Secondary | ICD-10-CM | POA: Diagnosis not present

## 2022-08-03 DIAGNOSIS — F4323 Adjustment disorder with mixed anxiety and depressed mood: Secondary | ICD-10-CM | POA: Diagnosis not present

## 2022-08-03 DIAGNOSIS — Z4801 Encounter for change or removal of surgical wound dressing: Secondary | ICD-10-CM | POA: Diagnosis not present

## 2022-08-03 DIAGNOSIS — D631 Anemia in chronic kidney disease: Secondary | ICD-10-CM | POA: Diagnosis not present

## 2022-08-03 DIAGNOSIS — T8189XA Other complications of procedures, not elsewhere classified, initial encounter: Secondary | ICD-10-CM | POA: Diagnosis not present

## 2022-08-03 DIAGNOSIS — J42 Unspecified chronic bronchitis: Secondary | ICD-10-CM | POA: Diagnosis not present

## 2022-08-04 DIAGNOSIS — T8189XA Other complications of procedures, not elsewhere classified, initial encounter: Secondary | ICD-10-CM | POA: Diagnosis not present

## 2022-08-05 DIAGNOSIS — Z9884 Bariatric surgery status: Secondary | ICD-10-CM | POA: Diagnosis not present

## 2022-08-05 DIAGNOSIS — Z8711 Personal history of peptic ulcer disease: Secondary | ICD-10-CM | POA: Diagnosis not present

## 2022-08-05 DIAGNOSIS — N184 Chronic kidney disease, stage 4 (severe): Secondary | ICD-10-CM | POA: Diagnosis not present

## 2022-08-05 DIAGNOSIS — T8189XA Other complications of procedures, not elsewhere classified, initial encounter: Secondary | ICD-10-CM | POA: Diagnosis not present

## 2022-08-05 DIAGNOSIS — Z4801 Encounter for change or removal of surgical wound dressing: Secondary | ICD-10-CM | POA: Diagnosis not present

## 2022-08-05 DIAGNOSIS — N179 Acute kidney failure, unspecified: Secondary | ICD-10-CM | POA: Diagnosis not present

## 2022-08-05 DIAGNOSIS — F4323 Adjustment disorder with mixed anxiety and depressed mood: Secondary | ICD-10-CM | POA: Diagnosis not present

## 2022-08-05 DIAGNOSIS — I509 Heart failure, unspecified: Secondary | ICD-10-CM | POA: Diagnosis not present

## 2022-08-05 DIAGNOSIS — I130 Hypertensive heart and chronic kidney disease with heart failure and stage 1 through stage 4 chronic kidney disease, or unspecified chronic kidney disease: Secondary | ICD-10-CM | POA: Diagnosis not present

## 2022-08-05 DIAGNOSIS — J42 Unspecified chronic bronchitis: Secondary | ICD-10-CM | POA: Diagnosis not present

## 2022-08-05 DIAGNOSIS — Z6830 Body mass index (BMI) 30.0-30.9, adult: Secondary | ICD-10-CM | POA: Diagnosis not present

## 2022-08-05 DIAGNOSIS — E875 Hyperkalemia: Secondary | ICD-10-CM | POA: Diagnosis not present

## 2022-08-05 DIAGNOSIS — D631 Anemia in chronic kidney disease: Secondary | ICD-10-CM | POA: Diagnosis not present

## 2022-08-05 DIAGNOSIS — G4730 Sleep apnea, unspecified: Secondary | ICD-10-CM | POA: Diagnosis not present

## 2022-08-05 DIAGNOSIS — Z4789 Encounter for other orthopedic aftercare: Secondary | ICD-10-CM | POA: Diagnosis not present

## 2022-08-05 DIAGNOSIS — E1122 Type 2 diabetes mellitus with diabetic chronic kidney disease: Secondary | ICD-10-CM | POA: Diagnosis not present

## 2022-08-06 DIAGNOSIS — T8189XA Other complications of procedures, not elsewhere classified, initial encounter: Secondary | ICD-10-CM | POA: Diagnosis not present

## 2022-08-07 DIAGNOSIS — M726 Necrotizing fasciitis: Secondary | ICD-10-CM | POA: Diagnosis not present

## 2022-08-07 DIAGNOSIS — T8189XA Other complications of procedures, not elsewhere classified, initial encounter: Secondary | ICD-10-CM | POA: Diagnosis not present

## 2022-08-08 ENCOUNTER — Inpatient Hospital Stay (HOSPITAL_COMMUNITY)
Admission: RE | Admit: 2022-08-08 | Discharge: 2022-08-08 | Disposition: A | Discharge status: Home / Self Care | Payer: BC Managed Care – PPO | Source: Ambulatory Visit | Attending: Nephrology | Admitting: Nephrology

## 2022-08-08 ENCOUNTER — Telehealth: Payer: Self-pay | Admitting: Orthopedic Surgery

## 2022-08-08 ENCOUNTER — Encounter (HOSPITAL_COMMUNITY): Payer: Self-pay

## 2022-08-08 DIAGNOSIS — N179 Acute kidney failure, unspecified: Secondary | ICD-10-CM | POA: Diagnosis not present

## 2022-08-08 DIAGNOSIS — E1122 Type 2 diabetes mellitus with diabetic chronic kidney disease: Secondary | ICD-10-CM | POA: Diagnosis not present

## 2022-08-08 DIAGNOSIS — Z6830 Body mass index (BMI) 30.0-30.9, adult: Secondary | ICD-10-CM | POA: Diagnosis not present

## 2022-08-08 DIAGNOSIS — Z9884 Bariatric surgery status: Secondary | ICD-10-CM | POA: Diagnosis not present

## 2022-08-08 DIAGNOSIS — N184 Chronic kidney disease, stage 4 (severe): Secondary | ICD-10-CM | POA: Diagnosis not present

## 2022-08-08 DIAGNOSIS — I130 Hypertensive heart and chronic kidney disease with heart failure and stage 1 through stage 4 chronic kidney disease, or unspecified chronic kidney disease: Secondary | ICD-10-CM | POA: Diagnosis not present

## 2022-08-08 DIAGNOSIS — Z8711 Personal history of peptic ulcer disease: Secondary | ICD-10-CM | POA: Diagnosis not present

## 2022-08-08 DIAGNOSIS — D631 Anemia in chronic kidney disease: Secondary | ICD-10-CM | POA: Diagnosis not present

## 2022-08-08 DIAGNOSIS — I509 Heart failure, unspecified: Secondary | ICD-10-CM | POA: Diagnosis not present

## 2022-08-08 DIAGNOSIS — G4730 Sleep apnea, unspecified: Secondary | ICD-10-CM | POA: Diagnosis not present

## 2022-08-08 DIAGNOSIS — J42 Unspecified chronic bronchitis: Secondary | ICD-10-CM | POA: Diagnosis not present

## 2022-08-08 DIAGNOSIS — E875 Hyperkalemia: Secondary | ICD-10-CM | POA: Diagnosis not present

## 2022-08-08 DIAGNOSIS — Z4801 Encounter for change or removal of surgical wound dressing: Secondary | ICD-10-CM | POA: Diagnosis not present

## 2022-08-08 DIAGNOSIS — T8189XA Other complications of procedures, not elsewhere classified, initial encounter: Secondary | ICD-10-CM | POA: Diagnosis not present

## 2022-08-08 DIAGNOSIS — F4323 Adjustment disorder with mixed anxiety and depressed mood: Secondary | ICD-10-CM | POA: Diagnosis not present

## 2022-08-08 DIAGNOSIS — Z4789 Encounter for other orthopedic aftercare: Secondary | ICD-10-CM | POA: Diagnosis not present

## 2022-08-08 NOTE — Telephone Encounter (Signed)
Tried to call. No answer. LMOM for her to call us back.

## 2022-08-08 NOTE — Telephone Encounter (Signed)
Home health nurse called wanting to give Vital signs and other information she states she needed to speak to Dr. Jess Barters nurse..please call Ubaldo Glassing) [phone]

## 2022-08-09 DIAGNOSIS — T8189XA Other complications of procedures, not elsewhere classified, initial encounter: Secondary | ICD-10-CM | POA: Diagnosis not present

## 2022-08-10 DIAGNOSIS — N184 Chronic kidney disease, stage 4 (severe): Secondary | ICD-10-CM | POA: Diagnosis not present

## 2022-08-10 DIAGNOSIS — I130 Hypertensive heart and chronic kidney disease with heart failure and stage 1 through stage 4 chronic kidney disease, or unspecified chronic kidney disease: Secondary | ICD-10-CM | POA: Diagnosis not present

## 2022-08-10 DIAGNOSIS — D631 Anemia in chronic kidney disease: Secondary | ICD-10-CM | POA: Diagnosis not present

## 2022-08-10 DIAGNOSIS — Z6830 Body mass index (BMI) 30.0-30.9, adult: Secondary | ICD-10-CM | POA: Diagnosis not present

## 2022-08-10 DIAGNOSIS — G4730 Sleep apnea, unspecified: Secondary | ICD-10-CM | POA: Diagnosis not present

## 2022-08-10 DIAGNOSIS — N179 Acute kidney failure, unspecified: Secondary | ICD-10-CM | POA: Diagnosis not present

## 2022-08-10 DIAGNOSIS — Z8711 Personal history of peptic ulcer disease: Secondary | ICD-10-CM | POA: Diagnosis not present

## 2022-08-10 DIAGNOSIS — T8189XA Other complications of procedures, not elsewhere classified, initial encounter: Secondary | ICD-10-CM | POA: Diagnosis not present

## 2022-08-10 DIAGNOSIS — J42 Unspecified chronic bronchitis: Secondary | ICD-10-CM | POA: Diagnosis not present

## 2022-08-10 DIAGNOSIS — I509 Heart failure, unspecified: Secondary | ICD-10-CM | POA: Diagnosis not present

## 2022-08-10 DIAGNOSIS — F4323 Adjustment disorder with mixed anxiety and depressed mood: Secondary | ICD-10-CM | POA: Diagnosis not present

## 2022-08-10 DIAGNOSIS — E1122 Type 2 diabetes mellitus with diabetic chronic kidney disease: Secondary | ICD-10-CM | POA: Diagnosis not present

## 2022-08-10 DIAGNOSIS — E875 Hyperkalemia: Secondary | ICD-10-CM | POA: Diagnosis not present

## 2022-08-10 DIAGNOSIS — Z9884 Bariatric surgery status: Secondary | ICD-10-CM | POA: Diagnosis not present

## 2022-08-10 DIAGNOSIS — Z4789 Encounter for other orthopedic aftercare: Secondary | ICD-10-CM | POA: Diagnosis not present

## 2022-08-10 DIAGNOSIS — Z4801 Encounter for change or removal of surgical wound dressing: Secondary | ICD-10-CM | POA: Diagnosis not present

## 2022-08-11 DIAGNOSIS — T8189XA Other complications of procedures, not elsewhere classified, initial encounter: Secondary | ICD-10-CM | POA: Diagnosis not present

## 2022-08-12 DIAGNOSIS — N184 Chronic kidney disease, stage 4 (severe): Secondary | ICD-10-CM | POA: Diagnosis not present

## 2022-08-12 DIAGNOSIS — Z9884 Bariatric surgery status: Secondary | ICD-10-CM | POA: Diagnosis not present

## 2022-08-12 DIAGNOSIS — F4323 Adjustment disorder with mixed anxiety and depressed mood: Secondary | ICD-10-CM | POA: Diagnosis not present

## 2022-08-12 DIAGNOSIS — E1122 Type 2 diabetes mellitus with diabetic chronic kidney disease: Secondary | ICD-10-CM | POA: Diagnosis not present

## 2022-08-12 DIAGNOSIS — G4730 Sleep apnea, unspecified: Secondary | ICD-10-CM | POA: Diagnosis not present

## 2022-08-12 DIAGNOSIS — T8189XA Other complications of procedures, not elsewhere classified, initial encounter: Secondary | ICD-10-CM | POA: Diagnosis not present

## 2022-08-12 DIAGNOSIS — E875 Hyperkalemia: Secondary | ICD-10-CM | POA: Diagnosis not present

## 2022-08-12 DIAGNOSIS — N179 Acute kidney failure, unspecified: Secondary | ICD-10-CM | POA: Diagnosis not present

## 2022-08-12 DIAGNOSIS — J42 Unspecified chronic bronchitis: Secondary | ICD-10-CM | POA: Diagnosis not present

## 2022-08-12 DIAGNOSIS — I509 Heart failure, unspecified: Secondary | ICD-10-CM | POA: Diagnosis not present

## 2022-08-12 DIAGNOSIS — I130 Hypertensive heart and chronic kidney disease with heart failure and stage 1 through stage 4 chronic kidney disease, or unspecified chronic kidney disease: Secondary | ICD-10-CM | POA: Diagnosis not present

## 2022-08-12 DIAGNOSIS — Z6830 Body mass index (BMI) 30.0-30.9, adult: Secondary | ICD-10-CM | POA: Diagnosis not present

## 2022-08-12 DIAGNOSIS — Z8711 Personal history of peptic ulcer disease: Secondary | ICD-10-CM | POA: Diagnosis not present

## 2022-08-12 DIAGNOSIS — Z4801 Encounter for change or removal of surgical wound dressing: Secondary | ICD-10-CM | POA: Diagnosis not present

## 2022-08-12 DIAGNOSIS — Z4789 Encounter for other orthopedic aftercare: Secondary | ICD-10-CM | POA: Diagnosis not present

## 2022-08-12 DIAGNOSIS — D631 Anemia in chronic kidney disease: Secondary | ICD-10-CM | POA: Diagnosis not present

## 2022-08-13 DIAGNOSIS — T8189XA Other complications of procedures, not elsewhere classified, initial encounter: Secondary | ICD-10-CM | POA: Diagnosis not present

## 2022-08-14 DIAGNOSIS — T8189XA Other complications of procedures, not elsewhere classified, initial encounter: Secondary | ICD-10-CM | POA: Diagnosis not present

## 2022-08-15 ENCOUNTER — Encounter (HOSPITAL_COMMUNITY)
Admission: RE | Admit: 2022-08-15 | Discharge: 2022-08-15 | Disposition: A | Discharge status: Home / Self Care | Payer: BC Managed Care – PPO | Source: Ambulatory Visit | Attending: Nephrology | Admitting: Nephrology

## 2022-08-15 VITALS — BP 156/92 | HR 99 | Temp 97.7°F | Resp 18

## 2022-08-15 DIAGNOSIS — J42 Unspecified chronic bronchitis: Secondary | ICD-10-CM | POA: Diagnosis not present

## 2022-08-15 DIAGNOSIS — N183 Chronic kidney disease, stage 3 unspecified: Secondary | ICD-10-CM | POA: Diagnosis not present

## 2022-08-15 DIAGNOSIS — I509 Heart failure, unspecified: Secondary | ICD-10-CM | POA: Diagnosis not present

## 2022-08-15 DIAGNOSIS — Z9884 Bariatric surgery status: Secondary | ICD-10-CM | POA: Diagnosis not present

## 2022-08-15 DIAGNOSIS — D631 Anemia in chronic kidney disease: Secondary | ICD-10-CM | POA: Diagnosis not present

## 2022-08-15 DIAGNOSIS — Z6830 Body mass index (BMI) 30.0-30.9, adult: Secondary | ICD-10-CM | POA: Diagnosis not present

## 2022-08-15 DIAGNOSIS — E875 Hyperkalemia: Secondary | ICD-10-CM | POA: Diagnosis not present

## 2022-08-15 DIAGNOSIS — I130 Hypertensive heart and chronic kidney disease with heart failure and stage 1 through stage 4 chronic kidney disease, or unspecified chronic kidney disease: Secondary | ICD-10-CM | POA: Diagnosis not present

## 2022-08-15 DIAGNOSIS — N179 Acute kidney failure, unspecified: Secondary | ICD-10-CM | POA: Diagnosis not present

## 2022-08-15 DIAGNOSIS — E1122 Type 2 diabetes mellitus with diabetic chronic kidney disease: Secondary | ICD-10-CM | POA: Diagnosis not present

## 2022-08-15 DIAGNOSIS — Z4801 Encounter for change or removal of surgical wound dressing: Secondary | ICD-10-CM | POA: Diagnosis not present

## 2022-08-15 DIAGNOSIS — F4323 Adjustment disorder with mixed anxiety and depressed mood: Secondary | ICD-10-CM | POA: Diagnosis not present

## 2022-08-15 DIAGNOSIS — N170 Acute kidney failure with tubular necrosis: Secondary | ICD-10-CM | POA: Insufficient documentation

## 2022-08-15 DIAGNOSIS — Z4789 Encounter for other orthopedic aftercare: Secondary | ICD-10-CM | POA: Diagnosis not present

## 2022-08-15 DIAGNOSIS — N184 Chronic kidney disease, stage 4 (severe): Secondary | ICD-10-CM | POA: Diagnosis not present

## 2022-08-15 DIAGNOSIS — Z8711 Personal history of peptic ulcer disease: Secondary | ICD-10-CM | POA: Diagnosis not present

## 2022-08-15 DIAGNOSIS — T8189XA Other complications of procedures, not elsewhere classified, initial encounter: Secondary | ICD-10-CM | POA: Diagnosis not present

## 2022-08-15 DIAGNOSIS — G4730 Sleep apnea, unspecified: Secondary | ICD-10-CM | POA: Diagnosis not present

## 2022-08-15 MED ORDER — EPOETIN ALFA-EPBX 10000 UNIT/ML IJ SOLN
15000.0000 [IU] | INTRAMUSCULAR | Status: DC
  Administered 2022-08-15: 15000 [IU] via SUBCUTANEOUS

## 2022-08-15 MED ORDER — EPOETIN ALFA-EPBX 10000 UNIT/ML IJ SOLN
INTRAMUSCULAR | Status: AC
  Filled 2022-08-15: qty 2

## 2022-08-15 NOTE — Progress Notes (Signed)
Pt is here for her Retacrit  injection.  HemoCue is 7.8.  JoAnne at office notified and verified to go ahead with orders as written

## 2022-08-16 DIAGNOSIS — Z9884 Bariatric surgery status: Secondary | ICD-10-CM | POA: Diagnosis not present

## 2022-08-16 DIAGNOSIS — J42 Unspecified chronic bronchitis: Secondary | ICD-10-CM | POA: Diagnosis not present

## 2022-08-16 DIAGNOSIS — Z4789 Encounter for other orthopedic aftercare: Secondary | ICD-10-CM | POA: Diagnosis not present

## 2022-08-16 DIAGNOSIS — G4730 Sleep apnea, unspecified: Secondary | ICD-10-CM | POA: Diagnosis not present

## 2022-08-16 DIAGNOSIS — E1122 Type 2 diabetes mellitus with diabetic chronic kidney disease: Secondary | ICD-10-CM | POA: Diagnosis not present

## 2022-08-16 DIAGNOSIS — Z6830 Body mass index (BMI) 30.0-30.9, adult: Secondary | ICD-10-CM | POA: Diagnosis not present

## 2022-08-16 DIAGNOSIS — F4323 Adjustment disorder with mixed anxiety and depressed mood: Secondary | ICD-10-CM | POA: Diagnosis not present

## 2022-08-16 DIAGNOSIS — T8189XA Other complications of procedures, not elsewhere classified, initial encounter: Secondary | ICD-10-CM | POA: Diagnosis not present

## 2022-08-16 DIAGNOSIS — D631 Anemia in chronic kidney disease: Secondary | ICD-10-CM | POA: Diagnosis not present

## 2022-08-16 DIAGNOSIS — I509 Heart failure, unspecified: Secondary | ICD-10-CM | POA: Diagnosis not present

## 2022-08-16 DIAGNOSIS — Z4801 Encounter for change or removal of surgical wound dressing: Secondary | ICD-10-CM | POA: Diagnosis not present

## 2022-08-16 DIAGNOSIS — N179 Acute kidney failure, unspecified: Secondary | ICD-10-CM | POA: Diagnosis not present

## 2022-08-16 DIAGNOSIS — Z8711 Personal history of peptic ulcer disease: Secondary | ICD-10-CM | POA: Diagnosis not present

## 2022-08-16 DIAGNOSIS — E875 Hyperkalemia: Secondary | ICD-10-CM | POA: Diagnosis not present

## 2022-08-16 DIAGNOSIS — I130 Hypertensive heart and chronic kidney disease with heart failure and stage 1 through stage 4 chronic kidney disease, or unspecified chronic kidney disease: Secondary | ICD-10-CM | POA: Diagnosis not present

## 2022-08-16 DIAGNOSIS — N184 Chronic kidney disease, stage 4 (severe): Secondary | ICD-10-CM | POA: Diagnosis not present

## 2022-08-16 LAB — POCT HEMOGLOBIN-HEMACUE: Hemoglobin: 7.8 g/dL — ABNORMAL LOW (ref 12.0–15.0)

## 2022-08-17 DIAGNOSIS — G4730 Sleep apnea, unspecified: Secondary | ICD-10-CM | POA: Diagnosis not present

## 2022-08-17 DIAGNOSIS — I130 Hypertensive heart and chronic kidney disease with heart failure and stage 1 through stage 4 chronic kidney disease, or unspecified chronic kidney disease: Secondary | ICD-10-CM | POA: Diagnosis not present

## 2022-08-17 DIAGNOSIS — J42 Unspecified chronic bronchitis: Secondary | ICD-10-CM | POA: Diagnosis not present

## 2022-08-17 DIAGNOSIS — Z4801 Encounter for change or removal of surgical wound dressing: Secondary | ICD-10-CM | POA: Diagnosis not present

## 2022-08-17 DIAGNOSIS — E1122 Type 2 diabetes mellitus with diabetic chronic kidney disease: Secondary | ICD-10-CM | POA: Diagnosis not present

## 2022-08-17 DIAGNOSIS — Z4789 Encounter for other orthopedic aftercare: Secondary | ICD-10-CM | POA: Diagnosis not present

## 2022-08-17 DIAGNOSIS — D631 Anemia in chronic kidney disease: Secondary | ICD-10-CM | POA: Diagnosis not present

## 2022-08-17 DIAGNOSIS — N184 Chronic kidney disease, stage 4 (severe): Secondary | ICD-10-CM | POA: Diagnosis not present

## 2022-08-17 DIAGNOSIS — E875 Hyperkalemia: Secondary | ICD-10-CM | POA: Diagnosis not present

## 2022-08-17 DIAGNOSIS — F4323 Adjustment disorder with mixed anxiety and depressed mood: Secondary | ICD-10-CM | POA: Diagnosis not present

## 2022-08-17 DIAGNOSIS — Z6830 Body mass index (BMI) 30.0-30.9, adult: Secondary | ICD-10-CM | POA: Diagnosis not present

## 2022-08-17 DIAGNOSIS — N179 Acute kidney failure, unspecified: Secondary | ICD-10-CM | POA: Diagnosis not present

## 2022-08-17 DIAGNOSIS — Z9884 Bariatric surgery status: Secondary | ICD-10-CM | POA: Diagnosis not present

## 2022-08-17 DIAGNOSIS — I509 Heart failure, unspecified: Secondary | ICD-10-CM | POA: Diagnosis not present

## 2022-08-17 DIAGNOSIS — T8189XA Other complications of procedures, not elsewhere classified, initial encounter: Secondary | ICD-10-CM | POA: Diagnosis not present

## 2022-08-17 DIAGNOSIS — Z8711 Personal history of peptic ulcer disease: Secondary | ICD-10-CM | POA: Diagnosis not present

## 2022-08-18 DIAGNOSIS — T8189XA Other complications of procedures, not elsewhere classified, initial encounter: Secondary | ICD-10-CM | POA: Diagnosis not present

## 2022-08-19 DIAGNOSIS — F4323 Adjustment disorder with mixed anxiety and depressed mood: Secondary | ICD-10-CM | POA: Diagnosis not present

## 2022-08-19 DIAGNOSIS — N184 Chronic kidney disease, stage 4 (severe): Secondary | ICD-10-CM | POA: Diagnosis not present

## 2022-08-19 DIAGNOSIS — N179 Acute kidney failure, unspecified: Secondary | ICD-10-CM | POA: Diagnosis not present

## 2022-08-19 DIAGNOSIS — Z6830 Body mass index (BMI) 30.0-30.9, adult: Secondary | ICD-10-CM | POA: Diagnosis not present

## 2022-08-19 DIAGNOSIS — I509 Heart failure, unspecified: Secondary | ICD-10-CM | POA: Diagnosis not present

## 2022-08-19 DIAGNOSIS — I130 Hypertensive heart and chronic kidney disease with heart failure and stage 1 through stage 4 chronic kidney disease, or unspecified chronic kidney disease: Secondary | ICD-10-CM | POA: Diagnosis not present

## 2022-08-19 DIAGNOSIS — E875 Hyperkalemia: Secondary | ICD-10-CM | POA: Diagnosis not present

## 2022-08-19 DIAGNOSIS — J42 Unspecified chronic bronchitis: Secondary | ICD-10-CM | POA: Diagnosis not present

## 2022-08-19 DIAGNOSIS — Z9884 Bariatric surgery status: Secondary | ICD-10-CM | POA: Diagnosis not present

## 2022-08-19 DIAGNOSIS — Z8711 Personal history of peptic ulcer disease: Secondary | ICD-10-CM | POA: Diagnosis not present

## 2022-08-19 DIAGNOSIS — Z4789 Encounter for other orthopedic aftercare: Secondary | ICD-10-CM | POA: Diagnosis not present

## 2022-08-19 DIAGNOSIS — E1122 Type 2 diabetes mellitus with diabetic chronic kidney disease: Secondary | ICD-10-CM | POA: Diagnosis not present

## 2022-08-19 DIAGNOSIS — D631 Anemia in chronic kidney disease: Secondary | ICD-10-CM | POA: Diagnosis not present

## 2022-08-19 DIAGNOSIS — T8189XA Other complications of procedures, not elsewhere classified, initial encounter: Secondary | ICD-10-CM | POA: Diagnosis not present

## 2022-08-19 DIAGNOSIS — G4730 Sleep apnea, unspecified: Secondary | ICD-10-CM | POA: Diagnosis not present

## 2022-08-19 DIAGNOSIS — Z4801 Encounter for change or removal of surgical wound dressing: Secondary | ICD-10-CM | POA: Diagnosis not present

## 2022-08-20 DIAGNOSIS — T8189XA Other complications of procedures, not elsewhere classified, initial encounter: Secondary | ICD-10-CM | POA: Diagnosis not present

## 2022-08-21 DIAGNOSIS — T8189XA Other complications of procedures, not elsewhere classified, initial encounter: Secondary | ICD-10-CM | POA: Diagnosis not present

## 2022-08-22 ENCOUNTER — Encounter (HOSPITAL_COMMUNITY): Payer: BC Managed Care – PPO

## 2022-08-22 DIAGNOSIS — E875 Hyperkalemia: Secondary | ICD-10-CM | POA: Diagnosis not present

## 2022-08-22 DIAGNOSIS — Z4801 Encounter for change or removal of surgical wound dressing: Secondary | ICD-10-CM | POA: Diagnosis not present

## 2022-08-22 DIAGNOSIS — Z8711 Personal history of peptic ulcer disease: Secondary | ICD-10-CM | POA: Diagnosis not present

## 2022-08-22 DIAGNOSIS — Z6830 Body mass index (BMI) 30.0-30.9, adult: Secondary | ICD-10-CM | POA: Diagnosis not present

## 2022-08-22 DIAGNOSIS — I509 Heart failure, unspecified: Secondary | ICD-10-CM | POA: Diagnosis not present

## 2022-08-22 DIAGNOSIS — F4323 Adjustment disorder with mixed anxiety and depressed mood: Secondary | ICD-10-CM | POA: Diagnosis not present

## 2022-08-22 DIAGNOSIS — I130 Hypertensive heart and chronic kidney disease with heart failure and stage 1 through stage 4 chronic kidney disease, or unspecified chronic kidney disease: Secondary | ICD-10-CM | POA: Diagnosis not present

## 2022-08-22 DIAGNOSIS — D631 Anemia in chronic kidney disease: Secondary | ICD-10-CM | POA: Diagnosis not present

## 2022-08-22 DIAGNOSIS — G4730 Sleep apnea, unspecified: Secondary | ICD-10-CM | POA: Diagnosis not present

## 2022-08-22 DIAGNOSIS — J42 Unspecified chronic bronchitis: Secondary | ICD-10-CM | POA: Diagnosis not present

## 2022-08-22 DIAGNOSIS — Z4789 Encounter for other orthopedic aftercare: Secondary | ICD-10-CM | POA: Diagnosis not present

## 2022-08-22 DIAGNOSIS — T8189XA Other complications of procedures, not elsewhere classified, initial encounter: Secondary | ICD-10-CM | POA: Diagnosis not present

## 2022-08-22 DIAGNOSIS — E1122 Type 2 diabetes mellitus with diabetic chronic kidney disease: Secondary | ICD-10-CM | POA: Diagnosis not present

## 2022-08-22 DIAGNOSIS — N179 Acute kidney failure, unspecified: Secondary | ICD-10-CM | POA: Diagnosis not present

## 2022-08-22 DIAGNOSIS — N184 Chronic kidney disease, stage 4 (severe): Secondary | ICD-10-CM | POA: Diagnosis not present

## 2022-08-22 DIAGNOSIS — Z9884 Bariatric surgery status: Secondary | ICD-10-CM | POA: Diagnosis not present

## 2022-08-23 DIAGNOSIS — T8189XA Other complications of procedures, not elsewhere classified, initial encounter: Secondary | ICD-10-CM | POA: Diagnosis not present

## 2022-08-24 DIAGNOSIS — I130 Hypertensive heart and chronic kidney disease with heart failure and stage 1 through stage 4 chronic kidney disease, or unspecified chronic kidney disease: Secondary | ICD-10-CM | POA: Diagnosis not present

## 2022-08-24 DIAGNOSIS — N179 Acute kidney failure, unspecified: Secondary | ICD-10-CM | POA: Diagnosis not present

## 2022-08-24 DIAGNOSIS — N184 Chronic kidney disease, stage 4 (severe): Secondary | ICD-10-CM | POA: Diagnosis not present

## 2022-08-24 DIAGNOSIS — Z4789 Encounter for other orthopedic aftercare: Secondary | ICD-10-CM | POA: Diagnosis not present

## 2022-08-24 DIAGNOSIS — Z9884 Bariatric surgery status: Secondary | ICD-10-CM | POA: Diagnosis not present

## 2022-08-24 DIAGNOSIS — J42 Unspecified chronic bronchitis: Secondary | ICD-10-CM | POA: Diagnosis not present

## 2022-08-24 DIAGNOSIS — E875 Hyperkalemia: Secondary | ICD-10-CM | POA: Diagnosis not present

## 2022-08-24 DIAGNOSIS — Z8711 Personal history of peptic ulcer disease: Secondary | ICD-10-CM | POA: Diagnosis not present

## 2022-08-24 DIAGNOSIS — Z4801 Encounter for change or removal of surgical wound dressing: Secondary | ICD-10-CM | POA: Diagnosis not present

## 2022-08-24 DIAGNOSIS — G4730 Sleep apnea, unspecified: Secondary | ICD-10-CM | POA: Diagnosis not present

## 2022-08-24 DIAGNOSIS — T8189XA Other complications of procedures, not elsewhere classified, initial encounter: Secondary | ICD-10-CM | POA: Diagnosis not present

## 2022-08-24 DIAGNOSIS — D631 Anemia in chronic kidney disease: Secondary | ICD-10-CM | POA: Diagnosis not present

## 2022-08-24 DIAGNOSIS — Z6830 Body mass index (BMI) 30.0-30.9, adult: Secondary | ICD-10-CM | POA: Diagnosis not present

## 2022-08-24 DIAGNOSIS — F4323 Adjustment disorder with mixed anxiety and depressed mood: Secondary | ICD-10-CM | POA: Diagnosis not present

## 2022-08-24 DIAGNOSIS — I509 Heart failure, unspecified: Secondary | ICD-10-CM | POA: Diagnosis not present

## 2022-08-24 DIAGNOSIS — E1122 Type 2 diabetes mellitus with diabetic chronic kidney disease: Secondary | ICD-10-CM | POA: Diagnosis not present

## 2022-08-25 DIAGNOSIS — T8189XA Other complications of procedures, not elsewhere classified, initial encounter: Secondary | ICD-10-CM | POA: Diagnosis not present

## 2022-08-26 DIAGNOSIS — F4323 Adjustment disorder with mixed anxiety and depressed mood: Secondary | ICD-10-CM | POA: Diagnosis not present

## 2022-08-26 DIAGNOSIS — Z9884 Bariatric surgery status: Secondary | ICD-10-CM | POA: Diagnosis not present

## 2022-08-26 DIAGNOSIS — D631 Anemia in chronic kidney disease: Secondary | ICD-10-CM | POA: Diagnosis not present

## 2022-08-26 DIAGNOSIS — I130 Hypertensive heart and chronic kidney disease with heart failure and stage 1 through stage 4 chronic kidney disease, or unspecified chronic kidney disease: Secondary | ICD-10-CM | POA: Diagnosis not present

## 2022-08-26 DIAGNOSIS — Z6830 Body mass index (BMI) 30.0-30.9, adult: Secondary | ICD-10-CM | POA: Diagnosis not present

## 2022-08-26 DIAGNOSIS — I509 Heart failure, unspecified: Secondary | ICD-10-CM | POA: Diagnosis not present

## 2022-08-26 DIAGNOSIS — Z4789 Encounter for other orthopedic aftercare: Secondary | ICD-10-CM | POA: Diagnosis not present

## 2022-08-26 DIAGNOSIS — N184 Chronic kidney disease, stage 4 (severe): Secondary | ICD-10-CM | POA: Diagnosis not present

## 2022-08-26 DIAGNOSIS — E1122 Type 2 diabetes mellitus with diabetic chronic kidney disease: Secondary | ICD-10-CM | POA: Diagnosis not present

## 2022-08-26 DIAGNOSIS — Z8711 Personal history of peptic ulcer disease: Secondary | ICD-10-CM | POA: Diagnosis not present

## 2022-08-26 DIAGNOSIS — T8189XA Other complications of procedures, not elsewhere classified, initial encounter: Secondary | ICD-10-CM | POA: Diagnosis not present

## 2022-08-26 DIAGNOSIS — E875 Hyperkalemia: Secondary | ICD-10-CM | POA: Diagnosis not present

## 2022-08-26 DIAGNOSIS — Z4801 Encounter for change or removal of surgical wound dressing: Secondary | ICD-10-CM | POA: Diagnosis not present

## 2022-08-26 DIAGNOSIS — J42 Unspecified chronic bronchitis: Secondary | ICD-10-CM | POA: Diagnosis not present

## 2022-08-26 DIAGNOSIS — G4730 Sleep apnea, unspecified: Secondary | ICD-10-CM | POA: Diagnosis not present

## 2022-08-26 DIAGNOSIS — N179 Acute kidney failure, unspecified: Secondary | ICD-10-CM | POA: Diagnosis not present

## 2022-08-27 DIAGNOSIS — T8189XA Other complications of procedures, not elsewhere classified, initial encounter: Secondary | ICD-10-CM | POA: Diagnosis not present

## 2022-08-28 DIAGNOSIS — T8189XA Other complications of procedures, not elsewhere classified, initial encounter: Secondary | ICD-10-CM | POA: Diagnosis not present

## 2022-08-29 ENCOUNTER — Other Ambulatory Visit: Payer: Self-pay

## 2022-08-29 ENCOUNTER — Inpatient Hospital Stay (HOSPITAL_COMMUNITY)
Admission: EM | Admit: 2022-08-29 | Discharge: 2022-09-01 | DRG: 603 | Disposition: A | Discharge status: Home Health | Payer: BC Managed Care – PPO | Attending: Internal Medicine | Admitting: Internal Medicine

## 2022-08-29 ENCOUNTER — Encounter (HOSPITAL_COMMUNITY)
Admission: RE | Admit: 2022-08-29 | Discharge: 2022-08-29 | Disposition: A | Discharge status: Home / Self Care | Payer: BC Managed Care – PPO | Source: Ambulatory Visit | Attending: Nephrology | Admitting: Nephrology

## 2022-08-29 ENCOUNTER — Emergency Department (HOSPITAL_COMMUNITY): Payer: BC Managed Care – PPO

## 2022-08-29 ENCOUNTER — Encounter (HOSPITAL_COMMUNITY): Payer: Self-pay

## 2022-08-29 VITALS — BP 144/91 | HR 103 | Temp 97.8°F | Resp 17

## 2022-08-29 DIAGNOSIS — J42 Unspecified chronic bronchitis: Secondary | ICD-10-CM | POA: Diagnosis not present

## 2022-08-29 DIAGNOSIS — F4323 Adjustment disorder with mixed anxiety and depressed mood: Secondary | ICD-10-CM | POA: Diagnosis not present

## 2022-08-29 DIAGNOSIS — G4730 Sleep apnea, unspecified: Secondary | ICD-10-CM | POA: Diagnosis not present

## 2022-08-29 DIAGNOSIS — Z6841 Body Mass Index (BMI) 40.0 and over, adult: Secondary | ICD-10-CM

## 2022-08-29 DIAGNOSIS — Z833 Family history of diabetes mellitus: Secondary | ICD-10-CM

## 2022-08-29 DIAGNOSIS — N179 Acute kidney failure, unspecified: Secondary | ICD-10-CM | POA: Diagnosis not present

## 2022-08-29 DIAGNOSIS — E875 Hyperkalemia: Secondary | ICD-10-CM | POA: Diagnosis not present

## 2022-08-29 DIAGNOSIS — M79604 Pain in right leg: Secondary | ICD-10-CM | POA: Diagnosis not present

## 2022-08-29 DIAGNOSIS — E872 Acidosis, unspecified: Secondary | ICD-10-CM | POA: Diagnosis not present

## 2022-08-29 DIAGNOSIS — E538 Deficiency of other specified B group vitamins: Secondary | ICD-10-CM | POA: Diagnosis present

## 2022-08-29 DIAGNOSIS — Z8349 Family history of other endocrine, nutritional and metabolic diseases: Secondary | ICD-10-CM

## 2022-08-29 DIAGNOSIS — I120 Hypertensive chronic kidney disease with stage 5 chronic kidney disease or end stage renal disease: Secondary | ICD-10-CM | POA: Diagnosis present

## 2022-08-29 DIAGNOSIS — I10 Essential (primary) hypertension: Secondary | ICD-10-CM | POA: Diagnosis not present

## 2022-08-29 DIAGNOSIS — R509 Fever, unspecified: Secondary | ICD-10-CM | POA: Diagnosis not present

## 2022-08-29 DIAGNOSIS — Z794 Long term (current) use of insulin: Secondary | ICD-10-CM | POA: Diagnosis not present

## 2022-08-29 DIAGNOSIS — Z8249 Family history of ischemic heart disease and other diseases of the circulatory system: Secondary | ICD-10-CM | POA: Diagnosis not present

## 2022-08-29 DIAGNOSIS — N183 Chronic kidney disease, stage 3 unspecified: Secondary | ICD-10-CM | POA: Insufficient documentation

## 2022-08-29 DIAGNOSIS — Z8739 Personal history of other diseases of the musculoskeletal system and connective tissue: Secondary | ICD-10-CM | POA: Diagnosis not present

## 2022-08-29 DIAGNOSIS — N2581 Secondary hyperparathyroidism of renal origin: Secondary | ICD-10-CM | POA: Diagnosis not present

## 2022-08-29 DIAGNOSIS — F419 Anxiety disorder, unspecified: Secondary | ICD-10-CM | POA: Diagnosis not present

## 2022-08-29 DIAGNOSIS — Z6830 Body mass index (BMI) 30.0-30.9, adult: Secondary | ICD-10-CM | POA: Diagnosis not present

## 2022-08-29 DIAGNOSIS — N184 Chronic kidney disease, stage 4 (severe): Secondary | ICD-10-CM | POA: Diagnosis not present

## 2022-08-29 DIAGNOSIS — I130 Hypertensive heart and chronic kidney disease with heart failure and stage 1 through stage 4 chronic kidney disease, or unspecified chronic kidney disease: Secondary | ICD-10-CM | POA: Diagnosis not present

## 2022-08-29 DIAGNOSIS — T8189XA Other complications of procedures, not elsewhere classified, initial encounter: Secondary | ICD-10-CM | POA: Diagnosis not present

## 2022-08-29 DIAGNOSIS — K219 Gastro-esophageal reflux disease without esophagitis: Secondary | ICD-10-CM | POA: Diagnosis present

## 2022-08-29 DIAGNOSIS — N170 Acute kidney failure with tubular necrosis: Secondary | ICD-10-CM | POA: Insufficient documentation

## 2022-08-29 DIAGNOSIS — R600 Localized edema: Secondary | ICD-10-CM | POA: Diagnosis not present

## 2022-08-29 DIAGNOSIS — L03115 Cellulitis of right lower limb: Principal | ICD-10-CM | POA: Diagnosis present

## 2022-08-29 DIAGNOSIS — F32A Depression, unspecified: Secondary | ICD-10-CM | POA: Diagnosis not present

## 2022-08-29 DIAGNOSIS — Z4801 Encounter for change or removal of surgical wound dressing: Secondary | ICD-10-CM | POA: Diagnosis not present

## 2022-08-29 DIAGNOSIS — D649 Anemia, unspecified: Secondary | ICD-10-CM | POA: Diagnosis present

## 2022-08-29 DIAGNOSIS — D631 Anemia in chronic kidney disease: Secondary | ICD-10-CM | POA: Diagnosis not present

## 2022-08-29 DIAGNOSIS — Z4789 Encounter for other orthopedic aftercare: Secondary | ICD-10-CM | POA: Diagnosis not present

## 2022-08-29 DIAGNOSIS — Z9884 Bariatric surgery status: Secondary | ICD-10-CM

## 2022-08-29 DIAGNOSIS — N185 Chronic kidney disease, stage 5: Secondary | ICD-10-CM | POA: Diagnosis not present

## 2022-08-29 DIAGNOSIS — I129 Hypertensive chronic kidney disease with stage 1 through stage 4 chronic kidney disease, or unspecified chronic kidney disease: Secondary | ICD-10-CM | POA: Diagnosis not present

## 2022-08-29 DIAGNOSIS — Z8711 Personal history of peptic ulcer disease: Secondary | ICD-10-CM | POA: Diagnosis not present

## 2022-08-29 DIAGNOSIS — E1129 Type 2 diabetes mellitus with other diabetic kidney complication: Secondary | ICD-10-CM | POA: Diagnosis present

## 2022-08-29 DIAGNOSIS — M7989 Other specified soft tissue disorders: Secondary | ICD-10-CM | POA: Diagnosis not present

## 2022-08-29 DIAGNOSIS — I509 Heart failure, unspecified: Secondary | ICD-10-CM | POA: Diagnosis not present

## 2022-08-29 DIAGNOSIS — E1122 Type 2 diabetes mellitus with diabetic chronic kidney disease: Secondary | ICD-10-CM | POA: Diagnosis not present

## 2022-08-29 DIAGNOSIS — R809 Proteinuria, unspecified: Secondary | ICD-10-CM | POA: Diagnosis not present

## 2022-08-29 DIAGNOSIS — Z79899 Other long term (current) drug therapy: Secondary | ICD-10-CM

## 2022-08-29 DIAGNOSIS — N189 Chronic kidney disease, unspecified: Secondary | ICD-10-CM | POA: Diagnosis not present

## 2022-08-29 HISTORY — DX: Necrotizing fasciitis: M72.6

## 2022-08-29 LAB — POCT HEMOGLOBIN-HEMACUE: Hemoglobin: 8 g/dL — ABNORMAL LOW (ref 12.0–15.0)

## 2022-08-29 LAB — CBC WITH DIFFERENTIAL/PLATELET
Abs Immature Granulocytes: 0.17 10*3/uL — ABNORMAL HIGH (ref 0.00–0.07)
Basophils Absolute: 0.1 10*3/uL (ref 0.0–0.1)
Basophils Relative: 0 %
Eosinophils Absolute: 0.2 10*3/uL (ref 0.0–0.5)
Eosinophils Relative: 1 %
HCT: 25.5 % — ABNORMAL LOW (ref 36.0–46.0)
Hemoglobin: 8.3 g/dL — ABNORMAL LOW (ref 12.0–15.0)
Immature Granulocytes: 1 %
Lymphocytes Relative: 8 %
Lymphs Abs: 1.9 10*3/uL (ref 0.7–4.0)
MCH: 30.3 pg (ref 26.0–34.0)
MCHC: 32.5 g/dL (ref 30.0–36.0)
MCV: 93.1 fL (ref 80.0–100.0)
Monocytes Absolute: 1.2 10*3/uL — ABNORMAL HIGH (ref 0.1–1.0)
Monocytes Relative: 5 %
Neutro Abs: 19 10*3/uL — ABNORMAL HIGH (ref 1.7–7.7)
Neutrophils Relative %: 85 %
Platelets: 243 10*3/uL (ref 150–400)
RBC: 2.74 MIL/uL — ABNORMAL LOW (ref 3.87–5.11)
RDW: 14.8 % (ref 11.5–15.5)
WBC: 22.5 10*3/uL — ABNORMAL HIGH (ref 4.0–10.5)
nRBC: 0 % (ref 0.0–0.2)

## 2022-08-29 LAB — I-STAT BETA HCG BLOOD, ED (MC, WL, AP ONLY): I-stat hCG, quantitative: 6.4 m[IU]/mL — ABNORMAL HIGH (ref <5)

## 2022-08-29 LAB — PREGNANCY, URINE: Preg Test, Ur: NEGATIVE

## 2022-08-29 LAB — COMPREHENSIVE METABOLIC PANEL
ALT: 16 U/L (ref 0–44)
AST: 14 U/L — ABNORMAL LOW (ref 15–41)
Albumin: 2.3 g/dL — ABNORMAL LOW (ref 3.5–5.0)
Alkaline Phosphatase: 72 U/L (ref 38–126)
Anion gap: 9 (ref 5–15)
BUN: 53 mg/dL — ABNORMAL HIGH (ref 6–20)
CO2: 18 mmol/L — ABNORMAL LOW (ref 22–32)
Calcium: 8 mg/dL — ABNORMAL LOW (ref 8.9–10.3)
Chloride: 107 mmol/L (ref 98–111)
Creatinine, Ser: 4.55 mg/dL — ABNORMAL HIGH (ref 0.44–1.00)
GFR, Estimated: 12 mL/min — ABNORMAL LOW (ref >60)
Glucose, Bld: 122 mg/dL — ABNORMAL HIGH (ref 70–99)
Potassium: 4.3 mmol/L (ref 3.5–5.1)
Sodium: 134 mmol/L — ABNORMAL LOW (ref 135–145)
Total Bilirubin: 0.6 mg/dL (ref 0.3–1.2)
Total Protein: 7.1 g/dL (ref 6.5–8.1)

## 2022-08-29 LAB — LACTIC ACID, PLASMA
Lactic Acid, Venous: 0.7 mmol/L (ref 0.5–1.9)
Lactic Acid, Venous: 0.7 mmol/L (ref 0.5–1.9)

## 2022-08-29 MED ORDER — PIPERACILLIN-TAZOBACTAM 3.375 G IVPB 30 MIN
3.3750 g | Freq: Once | INTRAVENOUS | Status: AC
  Administered 2022-08-29: 3.375 g via INTRAVENOUS
  Filled 2022-08-29: qty 50

## 2022-08-29 MED ORDER — EPOETIN ALFA-EPBX 10000 UNIT/ML IJ SOLN
INTRAMUSCULAR | Status: AC
  Administered 2022-08-29: 15000 [IU] via SUBCUTANEOUS
  Filled 2022-08-29: qty 2

## 2022-08-29 MED ORDER — EPOETIN ALFA-EPBX 10000 UNIT/ML IJ SOLN: 15000.0000 [IU] | INTRAMUSCULAR | Status: DC

## 2022-08-29 MED ORDER — VANCOMYCIN HCL IN DEXTROSE 1-5 GM/200ML-% IV SOLN
1000.0000 mg | Freq: Once | INTRAVENOUS | Status: AC
  Administered 2022-08-29: 1000 mg via INTRAVENOUS
  Filled 2022-08-29: qty 200

## 2022-08-29 MED ORDER — HYDROMORPHONE HCL 1 MG/ML IJ SOLN
1.0000 mg | Freq: Once | INTRAMUSCULAR | Status: AC
  Administered 2022-08-29: 1 mg via INTRAVENOUS
  Filled 2022-08-29: qty 1

## 2022-08-29 MED ORDER — CLINDAMYCIN PHOSPHATE 600 MG/50ML IV SOLN
600.0000 mg | Freq: Once | INTRAVENOUS | Status: AC
  Administered 2022-08-29: 600 mg via INTRAVENOUS
  Filled 2022-08-29: qty 50

## 2022-08-29 MED ORDER — ONDANSETRON HCL 4 MG/2ML IJ SOLN
4.0000 mg | Freq: Once | INTRAMUSCULAR | Status: AC
  Administered 2022-08-29: 4 mg via INTRAVENOUS
  Filled 2022-08-29: qty 2

## 2022-08-29 MED ORDER — SODIUM CHLORIDE 0.9 % IV SOLN: INTRAVENOUS | Status: DC

## 2022-08-29 MED ORDER — MORPHINE SULFATE (PF) 4 MG/ML IV SOLN: 4.0000 mg | Freq: Once | INTRAVENOUS | Status: DC

## 2022-08-29 NOTE — ED Provider Notes (Signed)
[facility] Provider Note   CSN: [id number] Arrival date & time: 08/29/22  1814     History {Add pertinent medical, surgical, social history, OB history to HPI:1} Chief Complaint  Patient presents with   Post-op Problem    Jacqueline [name] is a 42 y.o. female.  HPI     Home Medications Prior to Admission medications   Medication Sig Start Date End Date Taking? Authorizing Provider  acetaminophen (TYLENOL) 325 MG tablet Take 1-2 tablets (325-650 mg total) by mouth every 4 (four) hours as needed for mild pain. 07/07/22   Angiulli, Lavon Paganini, PA-C  amLODipine (NORVASC) 10 MG tablet Take 1 tablet (10 mg total) by mouth daily. 07/07/22   Angiulli, Lavon Paganini, PA-C  calcitRIOL (ROCALTROL) 0.25 MCG capsule Take 1 capsule (0.25 mcg total) by mouth daily. 07/07/22   Angiulli, Lavon Paganini, PA-C  diclofenac Sodium (VOLTAREN) 1 % GEL Apply 2 g topically 4 (four) times daily as needed (pain). 07/07/22   Angiulli, Lavon Paganini, PA-C  folic acid (FOLVITE) 1 MG tablet Take 1 mg by mouth daily.    [provider]  furosemide (LASIX) 40 MG tablet Take 1 tablet (40 mg total) by mouth daily. 07/07/22   Angiulli, Lavon Paganini, PA-C  gabapentin (NEURONTIN) 300 MG capsule Take 1 capsule (300 mg total) by mouth at bedtime. 07/07/22   Angiulli, Lavon Paganini, PA-C  hydrALAZINE (APRESOLINE) 10 MG tablet Take 1 tablet (10 mg total) by mouth daily. 07/07/22   Angiulli, Lavon Paganini, PA-C  HYDROcodone-acetaminophen (NORCO/VICODIN) 5-325 MG tablet Take 1-2 tablets by mouth every 4 (four) hours as needed for moderate pain or severe pain. 07/07/22   Angiulli, Lavon Paganini, PA-C  nystatin (MYCOSTATIN/NYSTOP) powder Apply topically 3 (three) times daily as directed to inner thighs 07/07/22   Angiulli, Lavon Paganini, PA-C  pantoprazole (PROTONIX) 40 MG tablet Take 1 tablet (40 mg total) by mouth at bedtime. 07/07/22   Angiulli, Lavon Paganini, PA-C  sertraline (ZOLOFT) 100 MG tablet Take 1 tablet (100 mg  total) by mouth at bedtime. 07/07/22   Angiulli, Lavon Paganini, PA-C  sodium bicarbonate 650 MG tablet Take 2 tablets (1,300 mg total) by mouth 2 (two) times daily. 07/07/22   Angiulli, Lavon Paganini, PA-C  sodium zirconium cyclosilicate (LOKELMA) 10 g PACK packet Take 10 g by mouth 3 (three) times daily. 07/07/22   Angiulli, Lavon Paganini, PA-C  Vitamin D, Ergocalciferol, (DRISDOL) 1.25 MG (50000 UNIT) CAPS capsule Take 1 capsule (50,000 Units total) by mouth every Thursday. 07/07/22   Angiulli, Lavon Paganini, PA-C      Allergies    Patient has no known allergies.    Review of Systems   Review of Systems  Physical Exam Updated Vital Signs BP (!) 151/91   Pulse 88   Temp 98.5 F (36.9 C)   Resp 19   Ht 1.651 m (5' 5"$ )   Wt 81.2 kg   LMP 08/16/2022 (Approximate)   SpO2 99%   BMI 29.79 kg/m  Physical Exam  ED Results / Procedures / Treatments   Labs (all labs ordered are listed, but only abnormal results are displayed) Labs Reviewed  COMPREHENSIVE METABOLIC PANEL - Abnormal; Notable for the following components:      Result Value   Sodium 134 (*)    CO2 18 (*)    Glucose, Bld 122 (*)    BUN 53 (*)    Creatinine, Ser 4.55 (*)    Calcium 8.0 (*)  Albumin 2.3 (*)    AST 14 (*)    GFR, Estimated 12 (*)    All other components within normal limits  CBC WITH DIFFERENTIAL/PLATELET - Abnormal; Notable for the following components:   WBC 22.5 (*)    RBC 2.74 (*)    Hemoglobin 8.3 (*)    HCT 25.5 (*)    Neutro Abs 19.0 (*)    Monocytes Absolute 1.2 (*)    Abs Immature Granulocytes 0.17 (*)    All other components within normal limits  LACTIC ACID, PLASMA  LACTIC ACID, PLASMA    EKG None  Radiology No results found.  Procedures Procedures  {Document cardiac monitor, telemetry assessment procedure when appropriate:1}  Medications Ordered in ED Medications - No data to display  ED Course/ Medical Decision Making/ A&P   {   Click here for ABCD2, HEART and other  calculatorsREFRESH Note before signing :1}                          Medical Decision Making  ***  {Document critical care time when appropriate:1} {Document review of labs and clinical decision tools ie heart score, Chads2Vasc2 etc:1}  {Document your independent review of radiology images, and any outside records:1} {Document your discussion with family members, caretakers, and with consultants:1} {Document social determinants of health affecting pt's care:1} {Document your decision making why or why not admission, treatments were needed:1} Final Clinical Impression(s) / ED Diagnoses Final diagnoses:  None    Rx / DC Orders ED Discharge Orders     None

## 2022-08-29 NOTE — ED Triage Notes (Signed)
Pt reports she had necrotizing fascitis to her right anterior thigh and her abdomen in December. She reports she started having a fever on Friday and is now having pain to the back side of that leg. She reports these are the same symptoms she had when she had the necrotizing fasc in December.

## 2022-08-29 NOTE — ED Provider Triage Note (Signed)
Emergency Medicine Provider Triage Evaluation Note  Jacqueline [name] , a 42 y.o. female  was evaluated in triage.  Pt complains of worsening postop problem.  Recently treated/admitted in ED for necrotizing fasciitis.  Has wound VAC but continues to have issues.  Now notices similar symptoms on the more distal aspect side of same thigh.  Pt states this feels the same as her early stages of necrotizing fasciitis.  Denies N/V, or abdominal pain at home.  #Endorses low grade fevers, which improve with tylenol.  Denies numbness or tingling in the extremity.  Review of Systems  Positive:  Negative: See above  Physical Exam  BP (!) 174/97   Pulse 98   Temp 98.5 F (36.9 C)   Resp 18   Ht 5' 5"$  (1.651 m)   Wt 81.2 kg   LMP 08/16/2022 (Approximate)   SpO2 100%   BMI 29.79 kg/m  Gen:   Awake, no distress   Resp:  Normal effort  MSK:   Moves extremities without difficulty  Other:  Tenderness and swelling of the right thigh, significant TTP of right medial aspect just above knee joint.  Able to move leg and wiggle toes without difficulty.  Medical Decision Making  Medically screening exam initiated at 6:59 PM.  Appropriate orders placed.  Willadene Lett was informed that the remainder of the evaluation will be completed by another provider, this initial triage assessment does not replace that evaluation, and the importance of remaining in the ED until their evaluation is complete.     [place], [place] [id number]

## 2022-08-29 NOTE — ED Notes (Signed)
Pt states she had a recent Dx of Nec Fasc December 2nd of 2023, required 2 surgeries, was in the hospital til Jul 07 2022. Reporting that she has had recent rain with her wound vac and it was discontinued today by the nurse currently presenting with pain on the posterior aspect of her right thigh proximal to the knee. The area of concern is palpably painful with some redness but no open wounds other than the documented wounds on the anterior part of the right thigh (which is bandaged).

## 2022-08-30 ENCOUNTER — Other Ambulatory Visit: Payer: Self-pay

## 2022-08-30 ENCOUNTER — Encounter (HOSPITAL_COMMUNITY): Payer: Self-pay | Admitting: Family Medicine

## 2022-08-30 ENCOUNTER — Emergency Department (HOSPITAL_COMMUNITY): Payer: BC Managed Care – PPO

## 2022-08-30 DIAGNOSIS — Z8249 Family history of ischemic heart disease and other diseases of the circulatory system: Secondary | ICD-10-CM | POA: Diagnosis not present

## 2022-08-30 DIAGNOSIS — M79604 Pain in right leg: Secondary | ICD-10-CM | POA: Diagnosis not present

## 2022-08-30 DIAGNOSIS — Z794 Long term (current) use of insulin: Secondary | ICD-10-CM

## 2022-08-30 DIAGNOSIS — N185 Chronic kidney disease, stage 5: Secondary | ICD-10-CM

## 2022-08-30 DIAGNOSIS — F419 Anxiety disorder, unspecified: Secondary | ICD-10-CM | POA: Diagnosis present

## 2022-08-30 DIAGNOSIS — G4730 Sleep apnea, unspecified: Secondary | ICD-10-CM | POA: Diagnosis present

## 2022-08-30 DIAGNOSIS — F32A Depression, unspecified: Secondary | ICD-10-CM

## 2022-08-30 DIAGNOSIS — Z8349 Family history of other endocrine, nutritional and metabolic diseases: Secondary | ICD-10-CM | POA: Diagnosis not present

## 2022-08-30 DIAGNOSIS — Z9884 Bariatric surgery status: Secondary | ICD-10-CM | POA: Diagnosis not present

## 2022-08-30 DIAGNOSIS — I10 Essential (primary) hypertension: Secondary | ICD-10-CM | POA: Diagnosis not present

## 2022-08-30 DIAGNOSIS — I120 Hypertensive chronic kidney disease with stage 5 chronic kidney disease or end stage renal disease: Secondary | ICD-10-CM | POA: Diagnosis present

## 2022-08-30 DIAGNOSIS — Z8739 Personal history of other diseases of the musculoskeletal system and connective tissue: Secondary | ICD-10-CM | POA: Diagnosis not present

## 2022-08-30 DIAGNOSIS — D649 Anemia, unspecified: Secondary | ICD-10-CM

## 2022-08-30 DIAGNOSIS — E1122 Type 2 diabetes mellitus with diabetic chronic kidney disease: Secondary | ICD-10-CM | POA: Diagnosis not present

## 2022-08-30 DIAGNOSIS — L03115 Cellulitis of right lower limb: Secondary | ICD-10-CM | POA: Diagnosis not present

## 2022-08-30 DIAGNOSIS — J42 Unspecified chronic bronchitis: Secondary | ICD-10-CM | POA: Diagnosis present

## 2022-08-30 DIAGNOSIS — E872 Acidosis, unspecified: Secondary | ICD-10-CM | POA: Diagnosis not present

## 2022-08-30 DIAGNOSIS — Z833 Family history of diabetes mellitus: Secondary | ICD-10-CM | POA: Diagnosis not present

## 2022-08-30 DIAGNOSIS — D631 Anemia in chronic kidney disease: Secondary | ICD-10-CM | POA: Diagnosis present

## 2022-08-30 DIAGNOSIS — E538 Deficiency of other specified B group vitamins: Secondary | ICD-10-CM | POA: Diagnosis present

## 2022-08-30 DIAGNOSIS — Z6841 Body Mass Index (BMI) 40.0 and over, adult: Secondary | ICD-10-CM | POA: Diagnosis not present

## 2022-08-30 DIAGNOSIS — K219 Gastro-esophageal reflux disease without esophagitis: Secondary | ICD-10-CM | POA: Diagnosis present

## 2022-08-30 DIAGNOSIS — R600 Localized edema: Secondary | ICD-10-CM | POA: Diagnosis not present

## 2022-08-30 DIAGNOSIS — Z79899 Other long term (current) drug therapy: Secondary | ICD-10-CM | POA: Diagnosis not present

## 2022-08-30 DIAGNOSIS — M7989 Other specified soft tissue disorders: Secondary | ICD-10-CM | POA: Diagnosis not present

## 2022-08-30 LAB — CBG MONITORING, ED
Glucose-Capillary: 86 mg/dL (ref 70–99)
Glucose-Capillary: 116 mg/dL — ABNORMAL HIGH (ref 70–99)

## 2022-08-30 LAB — BASIC METABOLIC PANEL
Anion gap: 11 (ref 5–15)
BUN: 53 mg/dL — ABNORMAL HIGH (ref 6–20)
CO2: 16 mmol/L — ABNORMAL LOW (ref 22–32)
Calcium: 7.6 mg/dL — ABNORMAL LOW (ref 8.9–10.3)
Chloride: 107 mmol/L (ref 98–111)
Creatinine, Ser: 4.32 mg/dL — ABNORMAL HIGH (ref 0.44–1.00)
GFR, Estimated: 12 mL/min — ABNORMAL LOW (ref >60)
Glucose, Bld: 132 mg/dL — ABNORMAL HIGH (ref 70–99)
Potassium: 3.7 mmol/L (ref 3.5–5.1)
Sodium: 134 mmol/L — ABNORMAL LOW (ref 135–145)

## 2022-08-30 LAB — GLUCOSE, CAPILLARY
Glucose-Capillary: 147 mg/dL — ABNORMAL HIGH (ref 70–99)
Glucose-Capillary: 175 mg/dL — ABNORMAL HIGH (ref 70–99)

## 2022-08-30 LAB — HIV ANTIBODY (ROUTINE TESTING W REFLEX): HIV Screen 4th Generation wRfx: NONREACTIVE

## 2022-08-30 LAB — CBC
HCT: 22.1 % — ABNORMAL LOW (ref 36.0–46.0)
Hemoglobin: 7.3 g/dL — ABNORMAL LOW (ref 12.0–15.0)
MCH: 30.7 pg (ref 26.0–34.0)
MCHC: 33 g/dL (ref 30.0–36.0)
MCV: 92.9 fL (ref 80.0–100.0)
Platelets: 202 10*3/uL (ref 150–400)
RBC: 2.38 MIL/uL — ABNORMAL LOW (ref 3.87–5.11)
RDW: 14.3 % (ref 11.5–15.5)
WBC: 20.4 10*3/uL — ABNORMAL HIGH (ref 4.0–10.5)
nRBC: 0 % (ref 0.0–0.2)

## 2022-08-30 LAB — PHOSPHORUS: Phosphorus: 4.9 mg/dL — ABNORMAL HIGH (ref 2.5–4.6)

## 2022-08-30 LAB — MAGNESIUM: Magnesium: 1.7 mg/dL (ref 1.7–2.4)

## 2022-08-30 MED ORDER — VANCOMYCIN HCL IN DEXTROSE 1-5 GM/200ML-% IV SOLN
1000.0000 mg | INTRAVENOUS | Status: DC
  Administered 2022-09-01: 1000 mg via INTRAVENOUS
  Filled 2022-08-30: qty 200

## 2022-08-30 MED ORDER — SERTRALINE HCL 100 MG PO TABS
100.0000 mg | ORAL_TABLET | Freq: Every day | ORAL | Status: DC
  Administered 2022-08-30 – 2022-08-31 (×2): 100 mg via ORAL
  Filled 2022-08-30 (×2): qty 1

## 2022-08-30 MED ORDER — SODIUM CHLORIDE 0.9 % IV SOLN
2.0000 g | INTRAVENOUS | Status: DC
  Administered 2022-08-30 – 2022-09-01 (×3): 2 g via INTRAVENOUS
  Filled 2022-08-30 (×3): qty 12.5

## 2022-08-30 MED ORDER — OXYCODONE HCL 5 MG PO TABS
5.0000 mg | ORAL_TABLET | ORAL | Status: DC | PRN
  Administered 2022-08-30 – 2022-08-31 (×3): 5 mg via ORAL
  Filled 2022-08-30 (×3): qty 1

## 2022-08-30 MED ORDER — HYDRALAZINE HCL 10 MG PO TABS
10.0000 mg | ORAL_TABLET | Freq: Every day | ORAL | Status: DC
  Administered 2022-08-30 – 2022-09-01 (×3): 10 mg via ORAL
  Filled 2022-08-30 (×3): qty 1

## 2022-08-30 MED ORDER — FENTANYL CITRATE PF 50 MCG/ML IJ SOSY
50.0000 ug | PREFILLED_SYRINGE | Freq: Once | INTRAMUSCULAR | Status: AC
  Administered 2022-08-30: 50 ug via INTRAVENOUS
  Filled 2022-08-30: qty 1

## 2022-08-30 MED ORDER — PANTOPRAZOLE SODIUM 40 MG PO TBEC
40.0000 mg | DELAYED_RELEASE_TABLET | Freq: Every day | ORAL | Status: DC
  Administered 2022-08-30 – 2022-08-31 (×2): 40 mg via ORAL
  Filled 2022-08-30 (×2): qty 1

## 2022-08-30 MED ORDER — SODIUM BICARBONATE 650 MG PO TABS
1300.0000 mg | ORAL_TABLET | Freq: Two times a day (BID) | ORAL | Status: DC
  Administered 2022-08-30 – 2022-09-01 (×5): 1300 mg via ORAL
  Filled 2022-08-30 (×5): qty 2

## 2022-08-30 MED ORDER — ACETAMINOPHEN 325 MG PO TABS
650.0000 mg | ORAL_TABLET | Freq: Four times a day (QID) | ORAL | Status: DC | PRN
  Administered 2022-08-30 – 2022-09-01 (×3): 650 mg via ORAL
  Filled 2022-08-30 (×3): qty 2

## 2022-08-30 MED ORDER — VANCOMYCIN HCL IN DEXTROSE 1-5 GM/200ML-% IV SOLN
1000.0000 mg | Freq: Once | INTRAVENOUS | Status: AC
  Administered 2022-08-30: 1000 mg via INTRAVENOUS
  Filled 2022-08-30: qty 200

## 2022-08-30 MED ORDER — SODIUM CHLORIDE 0.9% FLUSH
3.0000 mL | Freq: Two times a day (BID) | INTRAVENOUS | Status: DC
  Administered 2022-08-30 – 2022-09-01 (×4): 3 mL via INTRAVENOUS

## 2022-08-30 MED ORDER — CALCITRIOL 0.25 MCG PO CAPS
0.2500 ug | ORAL_CAPSULE | Freq: Every day | ORAL | Status: DC
  Administered 2022-08-30 – 2022-09-01 (×3): 0.25 ug via ORAL
  Filled 2022-08-30 (×3): qty 1

## 2022-08-30 MED ORDER — HYDRALAZINE HCL 25 MG PO TABS: 25.0000 mg | ORAL_TABLET | Freq: Four times a day (QID) | ORAL | Status: DC | PRN

## 2022-08-30 MED ORDER — ONDANSETRON HCL 4 MG PO TABS: 4.0000 mg | ORAL_TABLET | Freq: Four times a day (QID) | ORAL | Status: DC | PRN

## 2022-08-30 MED ORDER — HYDROMORPHONE HCL 1 MG/ML IJ SOLN
0.5000 mg | INTRAMUSCULAR | Status: DC | PRN
  Administered 2022-08-30 – 2022-09-01 (×10): 0.5 mg via INTRAVENOUS
  Filled 2022-08-30: qty 1
  Filled 2022-08-30: qty 0.5
  Filled 2022-08-30: qty 1
  Filled 2022-08-30 (×7): qty 0.5

## 2022-08-30 MED ORDER — INSULIN ASPART 100 UNIT/ML IJ SOLN: 0.0000 [IU] | Freq: Three times a day (TID) | INTRAMUSCULAR | Status: DC

## 2022-08-30 MED ORDER — SODIUM CHLORIDE 0.9 % IV SOLN: INTRAVENOUS | Status: DC

## 2022-08-30 MED ORDER — AMLODIPINE BESYLATE 10 MG PO TABS
10.0000 mg | ORAL_TABLET | Freq: Every day | ORAL | Status: DC
  Administered 2022-08-30 – 2022-09-01 (×3): 10 mg via ORAL
  Filled 2022-08-30: qty 1
  Filled 2022-08-30: qty 2
  Filled 2022-08-30: qty 1

## 2022-08-30 MED ORDER — ONDANSETRON HCL 4 MG/2ML IJ SOLN: 4.0000 mg | Freq: Four times a day (QID) | INTRAMUSCULAR | Status: DC | PRN

## 2022-08-30 MED ORDER — GABAPENTIN 300 MG PO CAPS
300.0000 mg | ORAL_CAPSULE | Freq: Every day | ORAL | Status: DC
  Administered 2022-08-30 – 2022-08-31 (×3): 300 mg via ORAL
  Filled 2022-08-30 (×3): qty 1

## 2022-08-30 MED ORDER — HEPARIN SODIUM (PORCINE) 5000 UNIT/ML IJ SOLN
5000.0000 [IU] | Freq: Three times a day (TID) | INTRAMUSCULAR | Status: DC
  Administered 2022-08-30 – 2022-09-01 (×5): 5000 [IU] via SUBCUTANEOUS
  Filled 2022-08-30 (×5): qty 1

## 2022-08-30 MED ORDER — ACETAMINOPHEN 650 MG RE SUPP: 650.0000 mg | Freq: Four times a day (QID) | RECTAL | Status: DC | PRN

## 2022-08-30 NOTE — ED Provider Notes (Signed)
124 To be seen by Dr. Zenia Resides of surgery following phone discussion    Jacqueline Becht, MD 08/30/22 [id number]

## 2022-08-30 NOTE — H&P (Addendum)
History and Physical    Shantale Ambs [id number] DOB: 1981-03-09 DOA: 08/29/2022  PCP: Audley Hose, MD   Patient coming from: Home   Chief Complaint: Fever, right leg pain   HPI: Jacqueline [name] is a pleasant 42 y.o. female with medical history significant for hypertension, type 2 diabetes mellitus, chronic kidney disease, and admission in December 2023 for necrotizing fasciitis, now presenting to the emergency department with fever and right leg pain.  Patient was admitted to the hospital in early December 2023 with necrotizing fasciitis involving the abdominal wall, pelvis, and proximal right lower extremity.  She was taken to the OR for I&D on 12/2 and 06/20/2022.    She began developing fevers at home on 08/26/2022 and then worsening pain in the proximal right lower extremity, similar to symptoms that led to her presentation in early December.  She had a wound VAC in place that was only pulling scant fluid, beginning to cause pain, and this was removed yesterday.    ED Course: Upon arrival to the ED, patient is found to be afebrile and saturating well on room air with slightly elevated heart rate and elevated blood pressures.  WBC is 22,500 and lactic acid 0.7.  Hemoglobin is 8.3.  Albumin is 2.3, serum bicarbonate 18, BUN 53, and creatinine 4.55.  Chest x-ray is negative for acute cardiopulmonary disease.  CT of the right femur demonstrates moderate circumferential subcutaneous edema throughout the right lower extremity without gas or loculated fluid collection.  Blood cultures were collected in the emergency department and the patient was treated with Dilaudid, fentanyl, Zofran, vancomycin, Zosyn, and clindamycin.  Review of Systems:  All other systems reviewed and apart from HPI, are negative.  Past Medical History:  Diagnosis Date   Anemia    Anxiety    Chronic bronchitis (HCC)    Hypertension    Increased frequency of headaches    Morbid obesity ([place])    Necrotizing  fasciitis ([place])    Sleep apnea    Type II diabetes mellitus [facility])     Past Surgical History:  Procedure Laterality Date   BARIATRIC SURGERY     BREAST REDUCTION SURGERY  03/21/2017   CARDIAC CATHETERIZATION  01/06/2016   CARDIAC CATHETERIZATION N/A 01/06/2016   Procedure: Left Heart Cath and Coronary Angiography;  Surgeon: Wellington Hampshire, MD;  Location: [facility];  Service: Cardiovascular;  Laterality: N/A;   CESAREAN SECTION  08/2014   I & D EXTREMITY Right 06/18/2022   Procedure: IRRIGATION AND DEBRIDEMENT RIGHT ABDOMEN AND THIGH;  Surgeon: Kinsinger, Arta Bruce, MD;  Location: [facility];  Service: General;  Laterality: Right;   I & D EXTREMITY Right 06/20/2022   Procedure: WOUND EXPLORATION OF RIGHT THIGH AND RIGHT GROIN WITH IRRIGATION AND DEBRIDEMENT;  Surgeon: Jovita Kussmaul, MD;  Location: [facility];  Service: General;  Laterality: Right;   REDUCTION MAMMAPLASTY Bilateral 03/21/2017    Social History:   reports that she has never smoked. She has never used smokeless tobacco. She reports that she does not drink alcohol and does not use drugs.  No Known Allergies  Family History  Problem Relation Age of Onset   Diabetes Mother    Hypertension Mother    Thyroid disease Mother    Kidney disease Maternal Grandmother    Diabetes Maternal Grandmother    Heart attack Other      Prior to Admission medications   Medication Sig Start Date End Date Taking? Authorizing Provider  acetaminophen (TYLENOL) 325  MG tablet Take 1-2 tablets (325-650 mg total) by mouth every 4 (four) hours as needed for mild pain. 07/07/22   Angiulli, Lavon Paganini, PA-C  amLODipine (NORVASC) 10 MG tablet Take 1 tablet (10 mg total) by mouth daily. 07/07/22   Angiulli, Lavon Paganini, PA-C  calcitRIOL (ROCALTROL) 0.25 MCG capsule Take 1 capsule (0.25 mcg total) by mouth daily. 07/07/22   Angiulli, Lavon Paganini, PA-C  diclofenac Sodium (VOLTAREN) 1 % GEL Apply 2 g topically 4 (four) times daily as needed (pain). 07/07/22    Angiulli, Lavon Paganini, PA-C  folic acid (FOLVITE) 1 MG tablet Take 1 mg by mouth daily.    [provider]  furosemide (LASIX) 40 MG tablet Take 1 tablet (40 mg total) by mouth daily. 07/07/22   Angiulli, Lavon Paganini, PA-C  gabapentin (NEURONTIN) 300 MG capsule Take 1 capsule (300 mg total) by mouth at bedtime. 07/07/22   Angiulli, Lavon Paganini, PA-C  hydrALAZINE (APRESOLINE) 10 MG tablet Take 1 tablet (10 mg total) by mouth daily. 07/07/22   Angiulli, Lavon Paganini, PA-C  HYDROcodone-acetaminophen (NORCO/VICODIN) 5-325 MG tablet Take 1-2 tablets by mouth every 4 (four) hours as needed for moderate pain or severe pain. 07/07/22   Angiulli, Lavon Paganini, PA-C  nystatin (MYCOSTATIN/NYSTOP) powder Apply topically 3 (three) times daily as directed to inner thighs 07/07/22   Angiulli, Lavon Paganini, PA-C  pantoprazole (PROTONIX) 40 MG tablet Take 1 tablet (40 mg total) by mouth at bedtime. 07/07/22   Angiulli, Lavon Paganini, PA-C  sertraline (ZOLOFT) 100 MG tablet Take 1 tablet (100 mg total) by mouth at bedtime. 07/07/22   Angiulli, Lavon Paganini, PA-C  sodium bicarbonate 650 MG tablet Take 2 tablets (1,300 mg total) by mouth 2 (two) times daily. 07/07/22   Angiulli, Lavon Paganini, PA-C  sodium zirconium cyclosilicate (LOKELMA) 10 g PACK packet Take 10 g by mouth 3 (three) times daily. 07/07/22   Angiulli, Lavon Paganini, PA-C  Vitamin D, Ergocalciferol, (DRISDOL) 1.25 MG (50000 UNIT) CAPS capsule Take 1 capsule (50,000 Units total) by mouth every Thursday. 07/07/22   Cathlyn Parsons, PA-C    Physical Exam: Vitals:   08/30/22 0000 08/30/22 0030 08/30/22 0043 08/30/22 0100  BP: (!) 159/91 (!) 158/88  (!) 149/94  Pulse: 86 86 88 85  Resp: 17 18 19 19  $ Temp:      SpO2: 100% 100% 100% 100%  Weight:      Height:        Constitutional: NAD, calm  Eyes: PERTLA, lids and conjunctivae normal ENMT: Mucous membranes are moist. Posterior pharynx clear of any exudate or lesions.   Neck: supple, no masses  Respiratory: no wheezing, no  crackles. No accessory muscle use.  Cardiovascular: S1 & S2 heard, regular rate and rhythm. No JVD. Abdomen: No distension, no tenderness, soft. Bowel sounds active.  Musculoskeletal: no clubbing / cyanosis. No joint deformity upper and lower extremities.   Skin: Erythema, heat, tenderness, and induration involving right thigh distal to healing surgical incision. Skin is otherwise warm, dry, well-perfused. Neurologic: CN 2-12 grossly intact. Moving all extremities. Alert and oriented.  Psychiatric: Pleasant. Cooperative.    Labs and Imaging on Admission: I have personally reviewed following labs and imaging studies  CBC: Recent Labs  Lab 08/29/22 1210 08/29/22 1910  WBC  --  22.5*  NEUTROABS  --  19.0*  HGB 8.0* 8.3*  HCT  --  25.5*  MCV  --  93.1  PLT  --  0000000   Basic Metabolic Panel: Recent  Labs  Lab 08/29/22 1910  NA 134*  K 4.3  CL 107  CO2 18*  GLUCOSE 122*  BUN 53*  CREATININE 4.55*  CALCIUM 8.0*   GFR: Estimated Creatinine Clearance: 17 mL/min (A) (by C-G formula based on SCr of 4.55 mg/dL (H)). Liver Function Tests: Recent Labs  Lab 08/29/22 1910  AST 14*  ALT 16  ALKPHOS 72  BILITOT 0.6  PROT 7.1  ALBUMIN 2.3*   No results for input(s): "LIPASE", "AMYLASE" in the last 168 hours. No results for input(s): "AMMONIA" in the last 168 hours. Coagulation Profile: No results for input(s): "INR", "PROTIME" in the last 168 hours. Cardiac Enzymes: No results for input(s): "CKTOTAL", "CKMB", "CKMBINDEX", "TROPONINI" in the last 168 hours. BNP (last 3 results) No results for input(s): "PROBNP" in the last 8760 hours. HbA1C: No results for input(s): "HGBA1C" in the last 72 hours. CBG: No results for input(s): "GLUCAP" in the last 168 hours. Lipid Profile: No results for input(s): "CHOL", "HDL", "LDLCALC", "TRIG", "CHOLHDL", "LDLDIRECT" in the last 72 hours. Thyroid Function Tests: No results for input(s): "TSH", "T4TOTAL", "FREET4", "T3FREE", "THYROIDAB" in  the last 72 hours. Anemia Panel: No results for input(s): "VITAMINB12", "FOLATE", "FERRITIN", "TIBC", "IRON", "RETICCTPCT" in the last 72 hours. Urine analysis:    Component Value Date/Time   COLORURINE YELLOW 06/18/2022 0820   APPEARANCEUR HAZY (A) 06/18/2022 0820   LABSPEC 1.013 06/18/2022 0820   PHURINE 7.0 06/18/2022 0820   GLUCOSEU 50 (A) 06/18/2022 0820   HGBUR NEGATIVE 06/18/2022 0820   BILIRUBINUR NEGATIVE 06/18/2022 0820   KETONESUR NEGATIVE 06/18/2022 0820   PROTEINUR >=300 (A) 06/18/2022 0820   UROBILINOGEN 0.2 08/21/2012 2130   NITRITE NEGATIVE 06/18/2022 0820   LEUKOCYTESUR TRACE (A) 06/18/2022 0820   Sepsis Labs: @LABRCNTIP$ (procalcitonin:4,lacticidven:4) )No results found for this or any previous visit (from the past 240 hour(s)).   Radiological Exams on Admission: CT FEMUR RIGHT WO CONTRAST  Result Date: 08/30/2022 CLINICAL DATA:  Soft tissue infection, fever, right leg pain EXAM: CT OF THE LOWER RIGHT EXTREMITY WITHOUT CONTRAST TECHNIQUE: Multidetector CT imaging of the right lower extremity was performed according to the standard protocol. RADIATION DOSE REDUCTION: This exam was performed according to the departmental dose-optimization program which includes automated exposure control, adjustment of the mA and/or kV according to patient size and/or use of iterative reconstruction technique. COMPARISON:  06/18/2022 FINDINGS: Bones/Joint/Cartilage Normal alignment. No acute fracture or dislocation. Mild right hip degenerative arthritis. No erosions or abnormal periosteal reaction. Ligaments Suboptimally assessed by CT. Muscles and Tendons Postsurgical changes are seen within the subcutaneous soft tissues of the anterior, proximal right thigh and extend to involve the proximal sartorius. Mild generalized muscular atrophy. Previously noted inter- and intramuscular gas has resolved. No intramuscular inflammatory changes are identified. Soft tissues There is moderate  circumferential subcutaneous edema throughout the right lower extremity which may reflect changes of asymmetric edema or a superimposed inflammatory process such as cellulitis. No subcutaneous gas or loculated fluid collection is identified. Extensive arteriosclerosis noted. Small right knee effusion noted. IMPRESSION: 1. Moderate circumferential subcutaneous edema throughout the right lower extremity which may reflect changes of asymmetric edema or a superimposed inflammatory process such as cellulitis. No subcutaneous gas or loculated fluid collection identified. 2. Postsurgical changes within the subcutaneous soft tissues of the anterior, proximal right thigh and extend to involve the proximal sartorius. Previously noted inter and intramuscular gas has resolved. 3. Mild right hip degenerative arthritis. 4. Small right knee effusion. Electronically Signed   By: Fidela Salisbury  M.D.   On: 08/30/2022 00:39   DG Chest Port 1 View  Result Date: 08/29/2022 CLINICAL DATA:  Fever.  Postoperative problem. EXAM: PORTABLE CHEST 1 VIEW COMPARISON:  06/18/2022 FINDINGS: The heart size and mediastinal contours are within normal limits. Both lungs are clear. The visualized skeletal structures are unremarkable. IMPRESSION: No active disease. Electronically Signed   By: Lucienne Capers M.D.   On: 08/29/2022 23:10    Assessment/Plan   1. RLE cellulitis  - Presents with fevers and pain, redness, and heat involving proximal RLE - No gas or fluid collection noted on CT in ED  - Blood cultures collected in ED and vancomycin, Zosyn, and clindamycin were administered  - Treat with vancomycin and cefepime, follow cultures and clinical course, consider repeat imaging if worsens or fails to improve in 24-48 hrs   2. CKD V  - SCr is 4.55 on admission, appears to be her baseline   - Renally-dose medications, continue bicarbonate, hold Lasix initially given concern for early/developing sepsis    3. Hypertension  - Continue  amlodipine and hydralazine    4. Type II DM  - Diet-controlled at home  - Monitor CBGs and use low-intensity SSI if needed   5. Anemia  - Hgb is 8.3 on admission  - Appears stable, no active bleeding    6. Depression  - Continue Zoloft   DVT prophylaxis: sq heparin  Code Status: Full  Level of Care: Level of care: Telemetry Medical Family Communication: None present  Disposition Plan:  Patient is from: home  Anticipated d/c is to: Home  Anticipated d/c date is: 09/02/22  Patient currently: pending improved cellulitis and transition to oral antibiotics  Consults called: none  Admission status: Inpatient     Vianne Bulls, MD [facility]  08/30/2022, 1:21 AM

## 2022-08-30 NOTE — Progress Notes (Signed)
Interim progress note not for billing  Patient is a 42 yo with a history of dm, obesity, ckd5, and recent admit (12/23) for nec fac of right thigh that was surgically debrided. Admitted by my colleague earlier this morning. I have reviewed the chart and evaluated the patient. She presents with a couple of days pain and swelling and redness of the right postero-medial thigh near the site of her healling nec fac wound. Wound vac discontinued yesterday 2/2 pain. Reports temp of 101 with nurse home visit. Here has mild erythema and significant tenderness and mild induration right medial thigh. CT without signs abscess, nothing concerning for nec fac. The wound itself appears to be healing well. I've taken a photo that is visible in the media tab. My colleague has started vanc/cefepime and I will continue that. Blood cultures are cooking. No fever here but is leukocytotic. I do suspect this is cellulitis and agree with plan for 24-48 hours of monitoring on IV abx. Given risk factors for DVT, I will obtain an u/s of the right leg. Will order wound consult for wound care recs.

## 2022-08-30 NOTE — Consult Note (Signed)
WOC Nurse Consult Note: Reason for Consult:  thigh wound Wound type: history of NF; has had VAC in the past, was removed. Wound is shallow and VAC was not collecting much drainage. Agree with DC of VAC, less than 0.5cm depth  CT neg; CCS has consulted and also does not feel any surgical needs at this time Pressure Injury POA: NA Measurement:see nursing flow sheet Wound bed:100% granulation  Drainage (amount, consistency, odor) yellow, moderate on dressings Periwound: some very mild redness noted on the medial right thigh distal from the open wound Dressing procedure/placement/frequency: Silver hydrofiber for drainage, continue wound healing and promotion of re-epithelization, top with dry dressing change daily while inpatient.    Re consult if needed, will not follow at this time. Thanks  Jabari Swoveland [employer], RN,CWOCN, CNS, [place] [phone])

## 2022-08-30 NOTE — ED Notes (Signed)
..ED TO INPATIENT HANDOFF REPORT  ED Nurse Name and Phone #:  [phone]  S Name/Age/Gender Jacqueline [name] 42 y.o. female Room/Bed: 008C/008C  Code Status   Code Status: Full Code  Home/SNF/Other Home Patient oriented to: self, place, time, and situation Is this baseline? Yes   Triage Complete: Triage complete  Chief Complaint Cellulitis of right leg [id number]  Triage Note Pt reports she had necrotizing fascitis to her right anterior thigh and her abdomen in December. She reports she started having a fever on Friday and is now having pain to the back side of that leg. She reports these are the same symptoms she had when she had the necrotizing fasc in December.    Allergies No Known Allergies  Level of Care/Admitting Diagnosis ED Disposition     ED Disposition  Admit   Condition  --   Comment  Hospital Area: [facility] [[id number]]  Level of Care: Telemetry Medical [[id number]]  May admit patient to [facility] or [facility] if equivalent level of care is available:: Yes  Covid Evaluation: Asymptomatic - no recent exposure (last 10 days) testing not required  Diagnosis: Cellulitis of right leg [id number]  Admitting Physician: Vianne Bulls [id number]  Attending Physician: Vianne Bulls [id number]  Certification:: I certify this patient will need inpatient services for at least 2 midnights  Estimated Length of Stay: 4          B Medical/Surgery History Past Medical History:  Diagnosis Date   Anemia    Anxiety    Chronic bronchitis ([place])    Hypertension    Increased frequency of headaches    Morbid obesity ([place])    Necrotizing fasciitis ([place])    Sleep apnea    Type II diabetes mellitus ([place])    Past Surgical History:  Procedure Laterality Date   BARIATRIC SURGERY     BREAST REDUCTION SURGERY  03/21/2017   CARDIAC CATHETERIZATION  01/06/2016   CARDIAC CATHETERIZATION N/A 01/06/2016   Procedure: Left Heart Cath and Coronary Angiography;  Surgeon:  Wellington Hampshire, MD;  Location: [facility];  Service: Cardiovascular;  Laterality: N/A;   CESAREAN SECTION  08/2014   I & D EXTREMITY Right 06/18/2022   Procedure: IRRIGATION AND DEBRIDEMENT RIGHT ABDOMEN AND THIGH;  Surgeon: Kinsinger, Arta Bruce, MD;  Location: [facility];  Service: General;  Laterality: Right;   I & D EXTREMITY Right 06/20/2022   Procedure: WOUND EXPLORATION OF RIGHT THIGH AND RIGHT GROIN WITH IRRIGATION AND DEBRIDEMENT;  Surgeon: Jovita Kussmaul, MD;  Location: [facility];  Service: General;  Laterality: Right;   REDUCTION MAMMAPLASTY Bilateral 03/21/2017     A IV Location/Drains/Wounds Patient Lines/Drains/Airways Status     Active Line/Drains/Airways     Name Placement date Placement time Site Days   Peripheral IV 08/29/22 20 G Left Antecubital 08/29/22  2216  Antecubital  1   Peripheral IV 08/29/22 22 G Right Antecubital 08/29/22  2319  Antecubital  1   Negative Pressure Wound Therapy Thigh Right 07/06/22  --  --  55   Negative Pressure Wound Therapy Groin Right --  --  --  --   External Urinary Catheter 08/29/22  2319  --  1   Wound / Incision (Open or Dehisced) 06/30/22 Non-pressure wound Buttocks Medial;Bilateral MSDA wound interglutteal cleft; pink in color with serous around unapproximated edges. Bilateral interglutes dry/desquamation. 06/30/22  1950  Buttocks  61  Intake/Output Last 24 hours  Intake/Output Summary (Last 24 hours) at 08/30/2022 1121 Last data filed at 08/30/2022 1000 Gross per 24 hour  Intake 98.45 ml  Output 200 ml  Net -101.55 ml    Labs/Imaging Results for orders placed or performed during the hospital encounter of 08/29/22 (from the past 48 hour(s))  Comprehensive metabolic panel     Status: Abnormal   Collection Time: 08/29/22  7:10 PM  Result Value Ref Range   Sodium 134 (L) 135 - 145 mmol/L   Potassium 4.3 3.5 - 5.1 mmol/L   Chloride 107 98 - 111 mmol/L   CO2 18 (L) 22 - 32 mmol/L   Glucose, Bld 122 (H) 70 - 99  mg/dL    Comment: Glucose reference range applies only to samples taken after fasting for at least 8 hours.   BUN 53 (H) 6 - 20 mg/dL   Creatinine, Ser 4.55 (H) 0.44 - 1.00 mg/dL   Calcium 8.0 (L) 8.9 - 10.3 mg/dL   Total Protein 7.1 6.5 - 8.1 g/dL   Albumin 2.3 (L) 3.5 - 5.0 g/dL   AST 14 (L) 15 - 41 U/L   ALT 16 0 - 44 U/L   Alkaline Phosphatase 72 38 - 126 U/L   Total Bilirubin 0.6 0.3 - 1.2 mg/dL   GFR, Estimated 12 (L) >60 mL/min    Comment: (NOTE) Calculated using the CKD-EPI Creatinine Equation (2021)    Anion gap 9 5 - 15    Comment: Performed at [facility] [address] [place], [place] [address]  CBC with Differential     Status: Abnormal   Collection Time: 08/29/22  7:10 PM  Result Value Ref Range   WBC 22.5 (H) 4.0 - 10.5 K/uL   RBC 2.74 (L) 3.87 - 5.11 MIL/uL   Hemoglobin 8.3 (L) 12.0 - 15.0 g/dL   HCT 25.5 (L) 36.0 - 46.0 %   MCV 93.1 80.0 - 100.0 fL   MCH 30.3 26.0 - 34.0 pg   MCHC 32.5 30.0 - 36.0 g/dL   RDW 14.8 11.5 - 15.5 %   Platelets 243 150 - 400 K/uL   nRBC 0.0 0.0 - 0.2 %   Neutrophils Relative % 85 %   Neutro Abs 19.0 (H) 1.7 - 7.7 K/uL   Lymphocytes Relative 8 %   Lymphs Abs 1.9 0.7 - 4.0 K/uL   Monocytes Relative 5 %   Monocytes Absolute 1.2 (H) 0.1 - 1.0 K/uL   Eosinophils Relative 1 %   Eosinophils Absolute 0.2 0.0 - 0.5 K/uL   Basophils Relative 0 %   Basophils Absolute 0.1 0.0 - 0.1 K/uL   Immature Granulocytes 1 %   Abs Immature Granulocytes 0.17 (H) 0.00 - 0.07 K/uL    Comment: Performed at [facility] [address], [place], [place] [address]  Lactic acid, plasma     Status: None   Collection Time: 08/29/22  7:10 PM  Result Value Ref Range   Lactic Acid, Venous 0.7 0.5 - 1.9 mmol/L    Comment: Performed at [facility] [address], [place], [place] [address]  Culture, blood (Routine X 2) w Reflex to ID Panel     Status: None (Preliminary result)   Collection Time: 08/29/22 10:02 PM   Specimen: BLOOD   Result Value Ref Range   Specimen Description BLOOD LEFT ANTECUBITAL    Special Requests      BOTTLES DRAWN AEROBIC AND ANAEROBIC Blood Culture results  may not be optimal due to an excessive volume of blood received in culture bottles   Culture      NO GROWTH < 12 HOURS Performed at [facility] [address], [place], [place] [address]    Report Status PENDING   Culture, blood (Routine X 2) w Reflex to ID Panel     Status: None (Preliminary result)   Collection Time: 08/29/22 10:07 PM   Specimen: BLOOD  Result Value Ref Range   Specimen Description BLOOD BLOOD LEFT HAND    Special Requests      BOTTLES DRAWN AEROBIC AND ANAEROBIC Blood Culture results may not be optimal due to an inadequate volume of blood received in culture bottles   Culture      NO GROWTH < 12 HOURS Performed at [facility] [address] [place], [place] [address]    Report Status PENDING   Lactic acid, plasma     Status: None   Collection Time: 08/29/22 10:44 PM  Result Value Ref Range   Lactic Acid, Venous 0.7 0.5 - 1.9 mmol/L    Comment: Performed at [facility] [address] [place], [place] [address]  I-Stat beta hCG blood, ED     Status: Abnormal   Collection Time: 08/29/22 10:53 PM  Result Value Ref Range   I-stat hCG, quantitative 6.4 (H) <5 mIU/mL   Comment 3            Comment:   GEST. AGE      CONC.  (mIU/mL)   <=1 WEEK        5 - 50     2 WEEKS       50 - 500     3 WEEKS       100 - 10,000     4 WEEKS     1,000 - 30,000        FEMALE AND NON-PREGNANT FEMALE:     LESS THAN 5 mIU/mL   Pregnancy, urine     Status: None   Collection Time: 08/29/22 11:16 PM  Result Value Ref Range   Preg Test, Ur NEGATIVE NEGATIVE    Comment:        THE SENSITIVITY OF THIS METHODOLOGY IS >20 mIU/mL. Performed at [facility], [place] [address], [place], [place] [address]   HIV Antibody (routine testing w rflx)     Status: None   Collection Time: 08/30/22  4:46 AM  Result  Value Ref Range   HIV Screen 4th Generation wRfx Non Reactive Non Reactive    Comment: Performed at [facility], [place] [address], [place], [place] [id number]  Basic metabolic panel     Status: Abnormal   Collection Time: 08/30/22  4:46 AM  Result Value Ref Range   Sodium 134 (L) 135 - 145 mmol/L   Potassium 3.7 3.5 - 5.1 mmol/L   Chloride 107 98 - 111 mmol/L   CO2 16 (L) 22 - 32 mmol/L   Glucose, Bld 132 (H) 70 - 99 mg/dL    Comment: Glucose reference range applies only to samples taken after fasting for at least 8 hours.   BUN 53 (H) 6 - 20 mg/dL   Creatinine, Ser 4.32 (H) 0.44 - 1.00 mg/dL   Calcium 7.6 (L) 8.9 - 10.3 mg/dL   GFR, Estimated 12 (L) >60 mL/min    Comment: (NOTE) Calculated using the CKD-EPI Creatinine Equation (2021)    Anion gap 11  5 - 15    Comment: Performed at [facility], [place] [address], [place], [place] [address]  Magnesium     Status: None   Collection Time: 08/30/22  4:46 AM  Result Value Ref Range   Magnesium 1.7 1.7 - 2.4 mg/dL    Comment: Performed at [facility] [address] [place], [place] [address]  Phosphorus     Status: Abnormal   Collection Time: 08/30/22  4:46 AM  Result Value Ref Range   Phosphorus 4.9 (H) 2.5 - 4.6 mg/dL    Comment: Performed at [facility] [address] [place], [place] [address]  CBC     Status: Abnormal   Collection Time: 08/30/22  4:46 AM  Result Value Ref Range   WBC 20.4 (H) 4.0 - 10.5 K/uL   RBC 2.38 (L) 3.87 - 5.11 MIL/uL   Hemoglobin 7.3 (L) 12.0 - 15.0 g/dL   HCT 22.1 (L) 36.0 - 46.0 %   MCV 92.9 80.0 - 100.0 fL   MCH 30.7 26.0 - 34.0 pg   MCHC 33.0 30.0 - 36.0 g/dL   RDW 14.3 11.5 - 15.5 %   Platelets 202 150 - 400 K/uL   nRBC 0.0 0.0 - 0.2 %    Comment: Performed at [facility], [place] [address], [place], [place] [address]  CBG monitoring, ED     Status: None   Collection Time: 08/30/22  8:03 AM  Result Value Ref Range   Glucose-Capillary 86 70 - 99 mg/dL     Comment: Glucose reference range applies only to samples taken after fasting for at least 8 hours.   CT FEMUR RIGHT WO CONTRAST  Result Date: 08/30/2022 CLINICAL DATA:  Soft tissue infection, fever, right leg pain EXAM: CT OF THE LOWER RIGHT EXTREMITY WITHOUT CONTRAST TECHNIQUE: Multidetector CT imaging of the right lower extremity was performed according to the standard protocol. RADIATION DOSE REDUCTION: This exam was performed according to the departmental dose-optimization program which includes automated exposure control, adjustment of the mA and/or kV according to patient size and/or use of iterative reconstruction technique. COMPARISON:  06/18/2022 FINDINGS: Bones/Joint/Cartilage Normal alignment. No acute fracture or dislocation. Mild right hip degenerative arthritis. No erosions or abnormal periosteal reaction. Ligaments Suboptimally assessed by CT. Muscles and Tendons Postsurgical changes are seen within the subcutaneous soft tissues of the anterior, proximal right thigh and extend to involve the proximal sartorius. Mild generalized muscular atrophy. Previously noted inter- and intramuscular gas has resolved. No intramuscular inflammatory changes are identified. Soft tissues There is moderate circumferential subcutaneous edema throughout the right lower extremity which may reflect changes of asymmetric edema or a superimposed inflammatory process such as cellulitis. No subcutaneous gas or loculated fluid collection is identified. Extensive arteriosclerosis noted. Small right knee effusion noted. IMPRESSION: 1. Moderate circumferential subcutaneous edema throughout the right lower extremity which may reflect changes of asymmetric edema or a superimposed inflammatory process such as cellulitis. No subcutaneous gas or loculated fluid collection identified. 2. Postsurgical changes within the subcutaneous soft tissues of the anterior, proximal right thigh and extend to involve the proximal sartorius.  Previously noted inter and intramuscular gas has resolved. 3. Mild right hip degenerative arthritis. 4. Small right knee effusion. Electronically Signed   By: Fidela Salisbury M.D.   On: 08/30/2022 00:39   DG Chest Port 1 View  Result Date: 08/29/2022 CLINICAL DATA:  Fever.  Postoperative problem. EXAM: PORTABLE CHEST 1 VIEW COMPARISON:  06/18/2022 FINDINGS: The heart  size and mediastinal contours are within normal limits. Both lungs are clear. The visualized skeletal structures are unremarkable. IMPRESSION: No active disease. Electronically Signed   By: Lucienne Capers M.D.   On: 08/29/2022 23:10    Pending Labs Unresulted Labs (From admission, onward)     Start     Ordered   08/30/22 [id number]  Basic metabolic panel  Daily,   R      08/30/22 0121   08/30/22 0500  CBC  Daily,   R      08/30/22 0121            Vitals/Pain Today's Vitals   08/30/22 0845 08/30/22 0900 08/30/22 1000 08/30/22 1015  BP:  (!) 143/82 (!) 159/91   Pulse: 81 81 91 83  Resp: 16 16 20 18  $ Temp:      TempSrc:      SpO2: 100% 100% 100% 100%  Weight:      Height:      PainSc:        Isolation Precautions No active isolations  Medications Medications  0.9 %  sodium chloride infusion ( Intravenous New Bag/Given 08/30/22 0211)  amLODipine (NORVASC) tablet 10 mg (10 mg Oral Given 08/30/22 0922)  hydrALAZINE (APRESOLINE) tablet 10 mg (10 mg Oral Given 08/30/22 0921)  sertraline (ZOLOFT) tablet 100 mg (has no administration in time range)  calcitRIOL (ROCALTROL) capsule 0.25 mcg (0.25 mcg Oral Given 08/30/22 0921)  pantoprazole (PROTONIX) EC tablet 40 mg (has no administration in time range)  sodium bicarbonate tablet 1,300 mg (1,300 mg Oral Given 08/30/22 0921)  gabapentin (NEURONTIN) capsule 300 mg (300 mg Oral Given 08/30/22 0210)  insulin aspart (novoLOG) injection 0-6 Units ( Subcutaneous Not Given 08/30/22 0804)  heparin injection 5,000 Units (has no administration in time range)  sodium chloride flush (NS)  0.9 % injection 3 mL (3 mLs Intravenous Not Given 08/30/22 1050)  acetaminophen (TYLENOL) tablet 650 mg (has no administration in time range)    Or  acetaminophen (TYLENOL) suppository 650 mg (has no administration in time range)  oxyCODONE (Oxy IR/ROXICODONE) immediate release tablet 5 mg (5 mg Oral Given 08/30/22 0355)  HYDROmorphone (DILAUDID) injection 0.5 mg (0.5 mg Intravenous Given 08/30/22 0613)  ondansetron (ZOFRAN) tablet 4 mg (has no administration in time range)    Or  ondansetron (ZOFRAN) injection 4 mg (has no administration in time range)  vancomycin (VANCOCIN) IVPB 1000 mg/200 mL premix (has no administration in time range)  ceFEPIme (MAXIPIME) 2 g in sodium chloride 0.9 % 100 mL IVPB (0 g Intravenous Stopped 08/30/22 1000)  hydrALAZINE (APRESOLINE) tablet 25 mg (has no administration in time range)  ondansetron (ZOFRAN) injection 4 mg (4 mg Intravenous Given 08/29/22 2225)  HYDROmorphone (DILAUDID) injection 1 mg (1 mg Intravenous Given 08/29/22 2226)  vancomycin (VANCOCIN) IVPB 1000 mg/200 mL premix (0 mg Intravenous Stopped 08/30/22 0100)  piperacillin-tazobactam (ZOSYN) IVPB 3.375 g (0 g Intravenous Stopped 08/29/22 2258)  clindamycin (CLEOCIN) IVPB 600 mg (0 mg Intravenous Stopped 08/29/22 2332)  fentaNYL (SUBLIMAZE) injection 50 mcg (50 mcg Intravenous Given 08/30/22 0038)  vancomycin (VANCOCIN) IVPB 1000 mg/200 mL premix (0 mg Intravenous Stopped 08/30/22 [id number])    Mobility walks     Focused Assessments Cardiac Assessment Handoff:  Cardiac Rhythm: Normal sinus rhythm Lab Results  Component Value Date   TROPONINI <0.03 09/12/2018   Lab Results  Component Value Date   DDIMER 3.89 (H) 08/17/2020   Does the Patient currently have chest pain? No   , Neuro  Assessment Handoff:  Swallow screen pass? Yes  Cardiac Rhythm: Normal sinus rhythm       Neuro Assessment: Within Defined Limits Neuro Checks:      Has TPA been given? No If patient is a Neuro Trauma and  patient is going to OR before floor call report to [facility] nurse: [phone] or [phone]   R Recommendations: See Admitting Provider Note  Report given to:   Additional Notes: na

## 2022-08-30 NOTE — ED Notes (Signed)
Patient transported to CT 

## 2022-08-30 NOTE — Progress Notes (Addendum)
Pharmacy Antibiotic Note  Jacqueline [name] is a 42 y.o. female admitted on 08/29/2022 with fevers/cellulitis.  Pharmacy has been consulted for Vancomycin and Cefepime dosing.  Vancomycin 1 g IV given in ED at  2330  Plan: Vancomycin 1000 mg IV now, then 1000 mg IV q48h Cefepime 2 g IV q24h  Height: 5' 5"$  (165.1 cm) Weight: 81.2 kg (179 lb) IBW/kg (Calculated) : 57  Temp (24hrs), Avg:98.3 F (36.8 C), Min:97.8 F (36.6 C), Max:98.7 F (37.1 C)  Recent Labs  Lab 08/29/22 1910 08/29/22 2244  WBC 22.5*  --   CREATININE 4.55*  --   LATICACIDVEN 0.7 0.7    Estimated Creatinine Clearance: 17 mL/min (A) (by C-G formula based on SCr of 4.55 mg/dL (H)).    No Known Allergies    Jacqueline [name] 08/30/2022 1:22 AM

## 2022-08-30 NOTE — Consult Note (Signed)
Jacqueline [name] 09-Sep-1980  [id number].    Requesting MD: Dr. April Palumbo Chief Complaint/Reason for Consult: cellulitis, history of necrotizing fasciitis  HPI:  Jacqueline [name] is a 42 yo female who presented to the ED with several days of worsening right thigh pain and fevers. In early December, she presented with necrotizing fasciitis involving the proximal right thigh, groin, and right retroperitoneum. She underwent debridement and was discharged with a wound vac on the right thigh incision. At her follow up with Dr. Kieth Brightly last month, she was having swelling in the right leg, which is now improving. She also reports improved sensation in the thigh. About 4 days ago, she began having pain on the more distal thigh, which has worsened. She reports a fever to 101.4 at home. Labs showed a WBC of 22. A CT scan was done and did not show any soft tissue gas or fluid collections. General surgery was consulted to rule out necrotizing fasciitis.  ROS: Review of Systems  Constitutional:  Positive for fever.  Cardiovascular:  Positive for leg swelling.    Family History  Problem Relation Age of Onset   Diabetes Mother    Hypertension Mother    Thyroid disease Mother    Kidney disease Maternal Grandmother    Diabetes Maternal Grandmother    Heart attack Other     Past Medical History:  Diagnosis Date   Anemia    Anxiety    Chronic bronchitis ([place])    Hypertension    Increased frequency of headaches    Morbid obesity ([place])    Necrotizing fasciitis ([place])    Sleep apnea    Type II diabetes mellitus ([place])     Past Surgical History:  Procedure Laterality Date   BARIATRIC SURGERY     BREAST REDUCTION SURGERY  03/21/2017   CARDIAC CATHETERIZATION  01/06/2016   CARDIAC CATHETERIZATION N/A 01/06/2016   Procedure: Left Heart Cath and Coronary Angiography;  Surgeon: Wellington Hampshire, MD;  Location: [facility];  Service: Cardiovascular;  Laterality: N/A;   CESAREAN SECTION  08/2014    I & D EXTREMITY Right 06/18/2022   Procedure: IRRIGATION AND DEBRIDEMENT RIGHT ABDOMEN AND THIGH;  Surgeon: Kinsinger, Arta Bruce, MD;  Location: [facility];  Service: General;  Laterality: Right;   I & D EXTREMITY Right 06/20/2022   Procedure: WOUND EXPLORATION OF RIGHT THIGH AND RIGHT GROIN WITH IRRIGATION AND DEBRIDEMENT;  Surgeon: Jovita Kussmaul, MD;  Location: [facility];  Service: General;  Laterality: Right;   REDUCTION MAMMAPLASTY Bilateral 03/21/2017    Social History:  reports that she has never smoked. She has never used smokeless tobacco. She reports that she does not drink alcohol and does not use drugs.  Allergies: No Known Allergies  (Not in a hospital admission)    Physical Exam: Blood pressure (!) 149/94, pulse 85, temperature 98.7 F (37.1 C), resp. rate 19, height 5' 5"$  (1.651 m), weight 81.2 kg, last menstrual period 08/16/2022, SpO2 100 %. General: resting comfortably, appears stated age, no apparent distress Neurological: alert and oriented, no focal deficits HEENT: normocephalic, atraumatic, no scleral icterus CV: regular rate and rhythm Respiratory: normal work of breathing on room air Abdomen: soft, nondistended. R groin incision has completely closed, no surrounding erythema or induration. Extremities: Right thigh incision is granulating, and wound bed is flush with the skin. No surrounding erythema or induration. There is induration and mild erythema on the right posteromedial distal thigh, but no fluctuance or crepitus. Psychiatric: normal mood and  affect Skin: warm and dry   Results for orders placed or performed during the hospital encounter of 08/29/22 (from the past 48 hour(s))  Comprehensive metabolic panel     Status: Abnormal   Collection Time: 08/29/22  7:10 PM  Result Value Ref Range   Sodium 134 (L) 135 - 145 mmol/L   Potassium 4.3 3.5 - 5.1 mmol/L   Chloride 107 98 - 111 mmol/L   CO2 18 (L) 22 - 32 mmol/L   Glucose, Bld 122 (H) 70 - 99 mg/dL     Comment: Glucose reference range applies only to samples taken after fasting for at least 8 hours.   BUN 53 (H) 6 - 20 mg/dL   Creatinine, Ser 4.55 (H) 0.44 - 1.00 mg/dL   Calcium 8.0 (L) 8.9 - 10.3 mg/dL   Total Protein 7.1 6.5 - 8.1 g/dL   Albumin 2.3 (L) 3.5 - 5.0 g/dL   AST 14 (L) 15 - 41 U/L   ALT 16 0 - 44 U/L   Alkaline Phosphatase 72 38 - 126 U/L   Total Bilirubin 0.6 0.3 - 1.2 mg/dL   GFR, Estimated 12 (L) >60 mL/min    Comment: (NOTE) Calculated using the CKD-EPI Creatinine Equation (2021)    Anion gap 9 5 - 15    Comment: Performed at [facility] [address], [place], [place] [address]  CBC with Differential     Status: Abnormal   Collection Time: 08/29/22  7:10 PM  Result Value Ref Range   WBC 22.5 (H) 4.0 - 10.5 K/uL   RBC 2.74 (L) 3.87 - 5.11 MIL/uL   Hemoglobin 8.3 (L) 12.0 - 15.0 g/dL   HCT 25.5 (L) 36.0 - 46.0 %   MCV 93.1 80.0 - 100.0 fL   MCH 30.3 26.0 - 34.0 pg   MCHC 32.5 30.0 - 36.0 g/dL   RDW 14.8 11.5 - 15.5 %   Platelets 243 150 - 400 K/uL   nRBC 0.0 0.0 - 0.2 %   Neutrophils Relative % 85 %   Neutro Abs 19.0 (H) 1.7 - 7.7 K/uL   Lymphocytes Relative 8 %   Lymphs Abs 1.9 0.7 - 4.0 K/uL   Monocytes Relative 5 %   Monocytes Absolute 1.2 (H) 0.1 - 1.0 K/uL   Eosinophils Relative 1 %   Eosinophils Absolute 0.2 0.0 - 0.5 K/uL   Basophils Relative 0 %   Basophils Absolute 0.1 0.0 - 0.1 K/uL   Immature Granulocytes 1 %   Abs Immature Granulocytes 0.17 (H) 0.00 - 0.07 K/uL    Comment: Performed at [facility] [address] [place], [place] [address]  Lactic acid, plasma     Status: None   Collection Time: 08/29/22  7:10 PM  Result Value Ref Range   Lactic Acid, Venous 0.7 0.5 - 1.9 mmol/L    Comment: Performed at [facility] [address], [place], [place] [address]  Lactic acid, plasma     Status: None   Collection Time: 08/29/22 10:44 PM  Result Value Ref Range   Lactic Acid, Venous 0.7 0.5 - 1.9 mmol/L    Comment:  Performed at [facility] [address] [place], [place] [address]  I-Stat beta hCG blood, ED     Status: Abnormal   Collection Time: 08/29/22 10:53 PM  Result Value Ref Range   I-stat hCG, quantitative 6.4 (H) <5 mIU/mL   Comment 3  Comment:   GEST. AGE      CONC.  (mIU/mL)   <=1 WEEK        5 - 50     2 WEEKS       50 - 500     3 WEEKS       100 - 10,000     4 WEEKS     1,000 - 30,000        FEMALE AND NON-PREGNANT FEMALE:     LESS THAN 5 mIU/mL   Pregnancy, urine     Status: None   Collection Time: 08/29/22 11:16 PM  Result Value Ref Range   Preg Test, Ur NEGATIVE NEGATIVE    Comment:        THE SENSITIVITY OF THIS METHODOLOGY IS >20 mIU/mL. Performed at [facility], [place] [address] [place], [place] [address]    CT FEMUR RIGHT WO CONTRAST  Result Date: 08/30/2022 CLINICAL DATA:  Soft tissue infection, fever, right leg pain EXAM: CT OF THE LOWER RIGHT EXTREMITY WITHOUT CONTRAST TECHNIQUE: Multidetector CT imaging of the right lower extremity was performed according to the standard protocol. RADIATION DOSE REDUCTION: This exam was performed according to the departmental dose-optimization program which includes automated exposure control, adjustment of the mA and/or kV according to patient size and/or use of iterative reconstruction technique. COMPARISON:  06/18/2022 FINDINGS: Bones/Joint/Cartilage Normal alignment. No acute fracture or dislocation. Mild right hip degenerative arthritis. No erosions or abnormal periosteal reaction. Ligaments Suboptimally assessed by CT. Muscles and Tendons Postsurgical changes are seen within the subcutaneous soft tissues of the anterior, proximal right thigh and extend to involve the proximal sartorius. Mild generalized muscular atrophy. Previously noted inter- and intramuscular gas has resolved. No intramuscular inflammatory changes are identified. Soft tissues There is moderate circumferential subcutaneous edema throughout the  right lower extremity which may reflect changes of asymmetric edema or a superimposed inflammatory process such as cellulitis. No subcutaneous gas or loculated fluid collection is identified. Extensive arteriosclerosis noted. Small right knee effusion noted. IMPRESSION: 1. Moderate circumferential subcutaneous edema throughout the right lower extremity which may reflect changes of asymmetric edema or a superimposed inflammatory process such as cellulitis. No subcutaneous gas or loculated fluid collection identified. 2. Postsurgical changes within the subcutaneous soft tissues of the anterior, proximal right thigh and extend to involve the proximal sartorius. Previously noted inter and intramuscular gas has resolved. 3. Mild right hip degenerative arthritis. 4. Small right knee effusion. Electronically Signed   By: Fidela Salisbury M.D.   On: 08/30/2022 00:39   DG Chest Port 1 View  Result Date: 08/29/2022 CLINICAL DATA:  Fever.  Postoperative problem. EXAM: PORTABLE CHEST 1 VIEW COMPARISON:  06/18/2022 FINDINGS: The heart size and mediastinal contours are within normal limits. Both lungs are clear. The visualized skeletal structures are unremarkable. IMPRESSION: No active disease. Electronically Signed   By: Lucienne Capers M.D.   On: 08/29/2022 23:10      Assessment/Plan 42 yo female with a history of debridement of the right thigh and right groin for necrotizing fasciitis. Her wounds are healing well with no signs of infection. She now presents with cellulitis of the more distal right thigh. There are no clinical signs of an abscess or necrotizing infection on exam, and no soft tissue gas or abscess on imaging. No indication for surgical debridement at this time. Treat with IV antibiotics.  Michaelle Birks, MD [facility], Hepatobiliary and Pancreatic Surgery 08/30/22 2:10 AM

## 2022-08-31 ENCOUNTER — Inpatient Hospital Stay (HOSPITAL_COMMUNITY): Payer: BC Managed Care – PPO

## 2022-08-31 DIAGNOSIS — M7989 Other specified soft tissue disorders: Secondary | ICD-10-CM

## 2022-08-31 DIAGNOSIS — L03115 Cellulitis of right lower limb: Secondary | ICD-10-CM | POA: Diagnosis not present

## 2022-08-31 LAB — FERRITIN: Ferritin: 141 ng/mL (ref 11–307)

## 2022-08-31 LAB — CBC
HCT: 21 % — ABNORMAL LOW (ref 36.0–46.0)
Hemoglobin: 7 g/dL — ABNORMAL LOW (ref 12.0–15.0)
MCH: 30.3 pg (ref 26.0–34.0)
MCHC: 33.3 g/dL (ref 30.0–36.0)
MCV: 90.9 fL (ref 80.0–100.0)
Platelets: 221 10*3/uL (ref 150–400)
RBC: 2.31 MIL/uL — ABNORMAL LOW (ref 3.87–5.11)
RDW: 14.2 % (ref 11.5–15.5)
WBC: 18.8 10*3/uL — ABNORMAL HIGH (ref 4.0–10.5)
nRBC: 0 % (ref 0.0–0.2)

## 2022-08-31 LAB — GLUCOSE, CAPILLARY
Glucose-Capillary: 97 mg/dL (ref 70–99)
Glucose-Capillary: 127 mg/dL — ABNORMAL HIGH (ref 70–99)
Glucose-Capillary: 87 mg/dL (ref 70–99)
Glucose-Capillary: 142 mg/dL — ABNORMAL HIGH (ref 70–99)

## 2022-08-31 LAB — BASIC METABOLIC PANEL
Anion gap: 8 (ref 5–15)
BUN: 50 mg/dL — ABNORMAL HIGH (ref 6–20)
CO2: 17 mmol/L — ABNORMAL LOW (ref 22–32)
Calcium: 7 mg/dL — ABNORMAL LOW (ref 8.9–10.3)
Chloride: 110 mmol/L (ref 98–111)
Creatinine, Ser: 4.19 mg/dL — ABNORMAL HIGH (ref 0.44–1.00)
GFR, Estimated: 13 mL/min — ABNORMAL LOW (ref >60)
Glucose, Bld: 105 mg/dL — ABNORMAL HIGH (ref 70–99)
Potassium: 3.8 mmol/L (ref 3.5–5.1)
Sodium: 135 mmol/L (ref 135–145)

## 2022-08-31 LAB — FOLATE: Folate: 5.3 ng/mL — ABNORMAL LOW (ref >5.9)

## 2022-08-31 LAB — RETICULOCYTES
Immature Retic Fract: 24.2 % — ABNORMAL HIGH (ref 2.3–15.9)
RBC.: 2.32 MIL/uL — ABNORMAL LOW (ref 3.87–5.11)
Retic Count, Absolute: 49 10*3/uL (ref 19.0–186.0)
Retic Ct Pct: 2.1 % (ref 0.4–3.1)

## 2022-08-31 LAB — IRON AND TIBC
Iron: 11 ug/dL — ABNORMAL LOW (ref 28–170)
Saturation Ratios: 9 % — ABNORMAL LOW (ref 10.4–31.8)
TIBC: 129 ug/dL — ABNORMAL LOW (ref 250–450)
UIBC: 118 ug/dL

## 2022-08-31 LAB — VITAMIN B12: Vitamin B-12: 490 pg/mL (ref 180–914)

## 2022-08-31 MED ORDER — FOLIC ACID 1 MG PO TABS
1.0000 mg | ORAL_TABLET | Freq: Every day | ORAL | Status: DC
  Administered 2022-08-31 – 2022-09-01 (×2): 1 mg via ORAL
  Filled 2022-08-31 (×2): qty 1

## 2022-08-31 NOTE — Progress Notes (Signed)
Progress Note     Subjective: Feeling better and thinks cellulitis is improving   Objective: Vital signs in last 24 hours: Temp:  [97.6 F (36.4 C)-98.3 F (36.8 C)] 97.8 F (36.6 C) (02/14 0806) Pulse Rate:  [81-103] 81 (02/14 0806) Resp:  [16-20] 18 (02/14 0806) BP: (129-163)/(76-91) 136/76 (02/14 0806) SpO2:  [93 %-100 %] 100 % (02/14 0806) Weight:  [83.3 kg] 83.3 kg (02/14 0500) Last BM Date : 08/29/22  Intake/Output from previous day: 02/13 0701 - 02/14 0700 In: 698.5 [P.O.:600; IV Piggyback:98.5] Out: -  Intake/Output this shift: No intake/output data recorded.  PE: General: pleasant, WD, female who is laying in bed in NAD Lungs: Respiratory effort nonlabored MSK: all 4 extremities are symmetrical with no cyanosis, clubbing, or edema. Skin: warm and dry. Right upper thigh wound with healthy granulation tissue and healing well. Right thigh cellulitis along medial portion without induration, wound, necrosis or palpable fluctuance Psych: A&Ox3 with an appropriate affect.    Lab Results:  Recent Labs    08/30/22 0446 08/31/22 0028  WBC 20.4* 18.8*  HGB 7.3* 7.0*  HCT 22.1* 21.0*  PLT 202 221   BMET Recent Labs    08/30/22 0446 08/31/22 0028  NA 134* 135  K 3.7 3.8  CL 107 110  CO2 16* 17*  GLUCOSE 132* 105*  BUN 53* 50*  CREATININE 4.32* 4.19*  CALCIUM 7.6* 7.0*   PT/INR No results for input(s): "LABPROT", "INR" in the last 72 hours. CMP     Component Value Date/Time   NA 135 08/31/2022 0028   K 3.8 08/31/2022 0028   CL 110 08/31/2022 0028   CO2 17 (L) 08/31/2022 0028   GLUCOSE 105 (H) 08/31/2022 0028   BUN 50 (H) 08/31/2022 0028   CREATININE 4.19 (H) 08/31/2022 0028   CALCIUM 7.0 (L) 08/31/2022 0028   PROT 7.1 08/29/2022 1910   ALBUMIN 2.3 (L) 08/29/2022 1910   AST 14 (L) 08/29/2022 1910   ALT 16 08/29/2022 1910   ALKPHOS 72 08/29/2022 1910   BILITOT 0.6 08/29/2022 1910   GFRNONAA 13 (L) 08/31/2022 0028   GFRAA 31 (L) 12/03/2019  0345   Lipase     Component Value Date/Time   LIPASE 40 08/27/2021 0811       Studies/Results: CT FEMUR RIGHT WO CONTRAST  Result Date: 08/30/2022 CLINICAL DATA:  Soft tissue infection, fever, right leg pain EXAM: CT OF THE LOWER RIGHT EXTREMITY WITHOUT CONTRAST TECHNIQUE: Multidetector CT imaging of the right lower extremity was performed according to the standard protocol. RADIATION DOSE REDUCTION: This exam was performed according to the departmental dose-optimization program which includes automated exposure control, adjustment of the mA and/or kV according to patient size and/or use of iterative reconstruction technique. COMPARISON:  06/18/2022 FINDINGS: Bones/Joint/Cartilage Normal alignment. No acute fracture or dislocation. Mild right hip degenerative arthritis. No erosions or abnormal periosteal reaction. Ligaments Suboptimally assessed by CT. Muscles and Tendons Postsurgical changes are seen within the subcutaneous soft tissues of the anterior, proximal right thigh and extend to involve the proximal sartorius. Mild generalized muscular atrophy. Previously noted inter- and intramuscular gas has resolved. No intramuscular inflammatory changes are identified. Soft tissues There is moderate circumferential subcutaneous edema throughout the right lower extremity which may reflect changes of asymmetric edema or a superimposed inflammatory process such as cellulitis. No subcutaneous gas or loculated fluid collection is identified. Extensive arteriosclerosis noted. Small right knee effusion noted. IMPRESSION: 1. Moderate circumferential subcutaneous edema throughout the right lower extremity which may  reflect changes of asymmetric edema or a superimposed inflammatory process such as cellulitis. No subcutaneous gas or loculated fluid collection identified. 2. Postsurgical changes within the subcutaneous soft tissues of the anterior, proximal right thigh and extend to involve the proximal sartorius.  Previously noted inter and intramuscular gas has resolved. 3. Mild right hip degenerative arthritis. 4. Small right knee effusion. Electronically Signed   By: Fidela Salisbury M.D.   On: 08/30/2022 00:39   DG Chest Port 1 View  Result Date: 08/29/2022 CLINICAL DATA:  Fever.  Postoperative problem. EXAM: PORTABLE CHEST 1 VIEW COMPARISON:  06/18/2022 FINDINGS: The heart size and mediastinal contours are within normal limits. Both lungs are clear. The visualized skeletal structures are unremarkable. IMPRESSION: No active disease. Electronically Signed   By: Lucienne Capers M.D.   On: 08/29/2022 23:10    Anti-infectives: Anti-infectives (From admission, onward)    Start     Dose/Rate Route Frequency Ordered Stop   09/01/22 1000  vancomycin (VANCOCIN) IVPB 1000 mg/200 mL premix        1,000 mg 200 mL/hr over 60 Minutes Intravenous Every 48 hours 08/30/22 0127     08/30/22 1000  ceFEPIme (MAXIPIME) 2 g in sodium chloride 0.9 % 100 mL IVPB        2 g 200 mL/hr over 30 Minutes Intravenous Every 24 hours 08/30/22 0127     08/30/22 0200  vancomycin (VANCOCIN) IVPB 1000 mg/200 mL premix        1,000 mg 200 mL/hr over 60 Minutes Intravenous  Once 08/30/22 0127 08/30/22 0312   08/29/22 2215  vancomycin (VANCOCIN) IVPB 1000 mg/200 mL premix        1,000 mg 200 mL/hr over 60 Minutes Intravenous  Once 08/29/22 2203 08/30/22 0100   08/29/22 2215  piperacillin-tazobactam (ZOSYN) IVPB 3.375 g        3.375 g 100 mL/hr over 30 Minutes Intravenous  Once 08/29/22 2203 08/29/22 2258   08/29/22 2215  clindamycin (CLEOCIN) IVPB 600 mg        600 mg 100 mL/hr over 30 Minutes Intravenous  Once 08/29/22 2203 08/29/22 2332        Assessment/Plan Cellulitis - distal right thigh H/o necrotizing fasciitis R thigh, s/p debridement - no soft tissue gas or abscess on imaging - no indication for surgical debridement - improving on abx  Surgical team will remain available as needed   FEN: reg ID:  [email or web address] VTE: SQH  I reviewed hospitalist notes, last 24 h vitals and pain scores, last 48 h intake and output, last 24 h labs and trends, and last 24 h imaging results.    LOS: 1 day   [facility] 08/31/2022, 9:45 AM Please see Amion for pager number during day hours 7:00am-4:30pm

## 2022-08-31 NOTE — Progress Notes (Signed)
Right lower extremity venous duplex has been completed. Preliminary results can be found in CV Proc through chart review.   08/31/22 1:08 PM Jacqueline [name] RVT

## 2022-08-31 NOTE — Progress Notes (Addendum)
PROGRESS NOTE    Jacqueline [name]  [id number] DOB: 05/26/1981 DOA: 08/29/2022 PCP: Audley Hose, MD    Brief Narrative:   Jacqueline [name] is a 42 y.o. female with past medical history significant for HTN, DM2, CKD stage V, anemia of chronic medical/renal disease, depression/anxiety, recent history of necrotizing fasciitis 06/2022 who presented to [facility] ED on 2/12 with fever and right lower extremity pain.  Patient reports onset of fevers on 2/9 and worsening pain in the proximal right lower extremity, similar to symptoms she had previously in December.  Patient continues with wound VAC in place with minimal output and also reports this is causing her to have pain.  Admitted to the hospital December 2023 with necrotizing fasciitis involving the abdominal wall, pelvis and proximal right lower extremity and was taken to the OR for I&D on 12/2 and 12/4.  Completed a course of IV antibiotics followed by Augmentin.  In the ED, temperature 98.5 F, HR 86, RR 17, BP 159/91, SpO2 100% room air.  WBC 22.5, hemoglobin 8.3, platelets 243.  Sodium 134, potassium 4.3, chloride 107, CO2 18, glucose 122, BUN 53, creatinine 4.55.  AST 14, ALT 16, total bilirubin 0.6.  Lactic acid 0.7.  Chest x-ray with no active cardiopulmonary disease process.  CT femur right with moderate circumferential subcutaneous edema right lower extremity consistent with cellulitis, no subcutaneous gas or loculated fluid collection identified.  Assessment & Plan:   Right lower extremity cellulitis Hx necrotizing fasciitis s/p I&D Patient presenting to ED with fever, progressive right lower extremity pain.  Recent history of necrotizing fasciitis December 2023 s/p I&D. CT femur right with moderate circumferential subcutaneous edema right lower extremity consistent with cellulitis, no subcutaneous gas or loculated fluid collection identified.  Vascular duplex ultrasound right lower extremity negative for DVT.  Seen by general surgery with  no surgical needs identified.  Wound VAC removed. -- WBC 22.5>20.4>18.8 -- Vancomycin, pharmacy consulted for dosing/monitoring -- Cefepime 2 g IV every 24 hours -- CBC daily -- Wound care right leg: Cleanse right groin wound with saline, pat dry, cut to fit silver hydrofiber and place over wound bed. Top with dry dressings, secure with tape.   Essential hypertension --Amlodipine 10 mg p.o. daily -- Hydralazine 10 mg p.o. daily  Type 2 diabetes mellitus Hemoglobin A1c 5.4 06/21/2022, well-controlled.  Diet controlled at baseline. -- Very sensitive SSI for coverage -- CBG before every meal/at bedtime  CKD stage V Metabolic acidosis Follows with nephrology outpatient.  Baseline creatinine 4.8-5.0. -- Cr 4.55>4.32>4.19 -- Avoid nephrotoxins, renal dose all medications -- Sodium bicarbonate 1300 mg p.o. twice daily -- BMP in the a.m.  Anemia of chronic medical/renal disease Folate deficiency Anemia panel with iron 11, TIBC 129, ferritin 141, folate 5.3, vitamin B12 490. -- Hgb 0000000 -- Folic acid 1 mg PO daily  Depression/anxiety --Sertraline 100 mg p.o. daily  GERD: -- Protonix 40 mg p.o. daily  Obesity Body mass index is 30.56 kg/m.  Discussed with patient needs for aggressive lifestyle changes/weight loss as this complicates all facets of care.  Outpatient follow-up with PCP.     DVT prophylaxis: heparin injection 5,000 Units Start: 08/30/22 2200    Code Status: Full Code Family Communication: Updated mother present at bedside this morning  Disposition Plan:  Level of care: Telemetry Medical Status is: Inpatient Remains inpatient appropriate because: IV antibiotics, anticipate discharge home in 1-2 days    Consultants:  General Surgery -signed off 2/14  Procedures:  Right lower extremity vascular duplex  ultrasound  Antimicrobials:  Vancomycin Cefepime   Subjective: Patient seen examined bedside, resting comfortably.  No specific complaints this  morning.  Mother present at bedside.  Pain to right lower extremity much improved.  Discussed with general surgery this morning, no indication for surgical intervention at this time.  Denies headache, no fever, no chills, no night sweats, no nausea/vomiting/diarrhea, no chest pain, no palpitations, no cough/congestion, no focal weakness, no fatigue, no paresthesias.  No acute events overnight per nursing staff.  Objective: Vitals:   08/30/22 2356 08/31/22 0500 08/31/22 0510 08/31/22 0806  BP: 129/82  (!) 144/86 136/76  Pulse: 98  89 81  Resp: 17  18 18  $ Temp: 98.1 F (36.7 C)  97.6 F (36.4 C) 97.8 F (36.6 C)  TempSrc:   Oral Oral  SpO2: 100%  93% 100%  Weight:  83.3 kg    Height:        Intake/Output Summary (Last 24 hours) at 08/31/2022 1348 Last data filed at 08/31/2022 0830 Gross per 24 hour  Intake 840 ml  Output --  Net 840 ml   Filed Weights   08/29/22 1850 08/31/22 0500  Weight: 81.2 kg 83.3 kg    Examination:  Physical Exam: GEN: NAD, alert and oriented x 3, obese HEENT: NCAT, PERRL, EOMI, sclera clear, MMM PULM: CTAB w/o wheezes/crackles, normal respiratory effort, on room air CV: RRR w/o M/G/R GI: abd soft, NTND, NABS, no R/G/M MSK: no peripheral edema, moves all extremities independently NEURO: CN II-XII intact, no focal deficits, sensation to light touch intact PSYCH: normal mood/affect Integumentary: Right lower extremity thigh wound noted with dressing in place, clean/dry/intact, otherwise no other concerning rashes/lesions/wounds noted on exposed skin surfaces.     Data Reviewed: I have personally reviewed following labs and imaging studies  CBC: Recent Labs  Lab 08/29/22 1210 08/29/22 1910 08/30/22 0446 08/31/22 0028  WBC  --  22.5* 20.4* 18.8*  NEUTROABS  --  19.0*  --   --   HGB 8.0* 8.3* 7.3* 7.0*  HCT  --  25.5* 22.1* 21.0*  MCV  --  93.1 92.9 90.9  PLT  --  243 202 [id number]   Basic Metabolic Panel: Recent Labs  Lab 08/29/22 1910  08/30/22 0446 08/31/22 0028  NA 134* 134* 135  K 4.3 3.7 3.8  CL 107 107 110  CO2 18* 16* 17*  GLUCOSE 122* 132* 105*  BUN 53* 53* 50*  CREATININE 4.55* 4.32* 4.19*  CALCIUM 8.0* 7.6* 7.0*  MG  --  1.7  --   PHOS  --  4.9*  --    GFR: Estimated Creatinine Clearance: 18.6 mL/min (A) (by C-G formula based on SCr of 4.19 mg/dL (H)). Liver Function Tests: Recent Labs  Lab 08/29/22 1910  AST 14*  ALT 16  ALKPHOS 72  BILITOT 0.6  PROT 7.1  ALBUMIN 2.3*   No results for input(s): "LIPASE", "AMYLASE" in the last 168 hours. No results for input(s): "AMMONIA" in the last 168 hours. Coagulation Profile: No results for input(s): "INR", "PROTIME" in the last 168 hours. Cardiac Enzymes: No results for input(s): "CKTOTAL", "CKMB", "CKMBINDEX", "TROPONINI" in the last 168 hours. BNP (last 3 results) No results for input(s): "PROBNP" in the last 8760 hours. HbA1C: No results for input(s): "HGBA1C" in the last 72 hours. CBG: Recent Labs  Lab 08/30/22 1319 08/30/22 1742 08/30/22 2030 08/31/22 0803 08/31/22 1152  GLUCAP 116* 147* 175* 97 127*   Lipid Profile: No results for input(s): "CHOL", "HDL", "LDLCALC", "  TRIG", "CHOLHDL", "LDLDIRECT" in the last 72 hours. Thyroid Function Tests: No results for input(s): "TSH", "T4TOTAL", "FREET4", "T3FREE", "THYROIDAB" in the last 72 hours. Anemia Panel: Recent Labs    08/30/22 0443 08/31/22 0026  VITAMINB12 490  --   FOLATE  --  5.3*  FERRITIN 141  --   TIBC 129*  --   IRON 11*  --   RETICCTPCT  --  2.1   Sepsis Labs: Recent Labs  Lab 08/29/22 1910 08/29/22 2244  LATICACIDVEN 0.7 0.7    Recent Results (from the past 240 hour(s))  Culture, blood (Routine X 2) w Reflex to ID Panel     Status: None (Preliminary result)   Collection Time: 08/29/22 10:02 PM   Specimen: BLOOD  Result Value Ref Range Status   Specimen Description BLOOD LEFT ANTECUBITAL  Final   Special Requests   Final    BOTTLES DRAWN AEROBIC AND ANAEROBIC  Blood Culture results may not be optimal due to an excessive volume of blood received in culture bottles   Culture   Final    NO GROWTH 2 DAYS Performed at [facility] [address] [place], [place] [address]    Report Status PENDING  Incomplete  Culture, blood (Routine X 2) w Reflex to ID Panel     Status: None (Preliminary result)   Collection Time: 08/29/22 10:07 PM   Specimen: BLOOD  Result Value Ref Range Status   Specimen Description BLOOD BLOOD LEFT HAND  Final   Special Requests   Final    BOTTLES DRAWN AEROBIC AND ANAEROBIC Blood Culture results may not be optimal due to an inadequate volume of blood received in culture bottles   Culture   Final    NO GROWTH 2 DAYS Performed at [facility], [place] [address] [place], [place] [address]    Report Status PENDING  Incomplete         Radiology Studies: VAS [place] LOWER EXTREMITY VENOUS (DVT)  Result Date: 08/31/2022  Lower Venous DVT Study Patient Name:  VENNELA FORTE  Date of Exam:   08/31/2022 Medical Rec #: [id number]     Accession #:    [id number] Date of Birth: 03/18/1981     Patient Gender: F Patient Age:   47 years Exam Location:  [facility] Procedure:      VAS [place] LOWER EXTREMITY VENOUS (DVT) Referring Phys: Laurey Arrow --------------------------------------------------------------------------------  Indications: Swelling.  Limitations: Poor ultrasound/tissue interface, bandages, open wound and patient pain tolerance. Comparison Study: 07/25/2022 - No evidence of deep vein thrombosis seen in the                   lower extremities. Please see limitation of study as noted                   above. Performing Technologist: Oliver Hum RVT  Examination Guidelines: A complete evaluation includes B-mode imaging, spectral Doppler, color Doppler, and power Doppler as needed of all accessible portions of each vessel. Bilateral testing is considered an integral part of a complete examination. Limited examinations for  reoccurring indications may be performed as noted. The reflux portion of the exam is performed with the patient in reverse Trendelenburg.  +---------+---------------+---------+-----------+----------+-------------------+ RIGHT    CompressibilityPhasicitySpontaneityPropertiesThrombus Aging      +---------+---------------+---------+-----------+----------+-------------------+ CFV      Full           Yes      Yes                                      +---------+---------------+---------+-----------+----------+-------------------+  SFJ      Full                                                             +---------+---------------+---------+-----------+----------+-------------------+ FV Prox  Full                                                             +---------+---------------+---------+-----------+----------+-------------------+ FV Mid   Full           Yes      Yes                                      +---------+---------------+---------+-----------+----------+-------------------+ FV Distal               Yes      Yes                                      +---------+---------------+---------+-----------+----------+-------------------+ PFV      Full                                                             +---------+---------------+---------+-----------+----------+-------------------+ POP      Full           Yes      Yes                                      +---------+---------------+---------+-----------+----------+-------------------+ PTV      Full                                                             +---------+---------------+---------+-----------+----------+-------------------+ PERO                                                  Patency shown with                                                        color doppler       +---------+---------------+---------+-----------+----------+-------------------+    +----+---------------+---------+-----------+----------+--------------+ LEFTCompressibilityPhasicitySpontaneityPropertiesThrombus Aging +----+---------------+---------+-----------+----------+--------------+ CFV Full           Yes      Yes                                 +----+---------------+---------+-----------+----------+--------------+  Summary: RIGHT: - There is no evidence of deep vein thrombosis in the lower extremity. However, portions of this examination were limited- see technologist comments above.  - No cystic structure found in the popliteal fossa.  LEFT: - No evidence of common femoral vein obstruction.  *See table(s) above for measurements and observations.    Preliminary    CT FEMUR RIGHT WO CONTRAST  Result Date: 08/30/2022 CLINICAL DATA:  Soft tissue infection, fever, right leg pain EXAM: CT OF THE LOWER RIGHT EXTREMITY WITHOUT CONTRAST TECHNIQUE: Multidetector CT imaging of the right lower extremity was performed according to the standard protocol. RADIATION DOSE REDUCTION: This exam was performed according to the departmental dose-optimization program which includes automated exposure control, adjustment of the mA and/or kV according to patient size and/or use of iterative reconstruction technique. COMPARISON:  06/18/2022 FINDINGS: Bones/Joint/Cartilage Normal alignment. No acute fracture or dislocation. Mild right hip degenerative arthritis. No erosions or abnormal periosteal reaction. Ligaments Suboptimally assessed by CT. Muscles and Tendons Postsurgical changes are seen within the subcutaneous soft tissues of the anterior, proximal right thigh and extend to involve the proximal sartorius. Mild generalized muscular atrophy. Previously noted inter- and intramuscular gas has resolved. No intramuscular inflammatory changes are identified. Soft tissues There is moderate circumferential subcutaneous edema throughout the right lower extremity which may reflect changes of  asymmetric edema or a superimposed inflammatory process such as cellulitis. No subcutaneous gas or loculated fluid collection is identified. Extensive arteriosclerosis noted. Small right knee effusion noted. IMPRESSION: 1. Moderate circumferential subcutaneous edema throughout the right lower extremity which may reflect changes of asymmetric edema or a superimposed inflammatory process such as cellulitis. No subcutaneous gas or loculated fluid collection identified. 2. Postsurgical changes within the subcutaneous soft tissues of the anterior, proximal right thigh and extend to involve the proximal sartorius. Previously noted inter and intramuscular gas has resolved. 3. Mild right hip degenerative arthritis. 4. Small right knee effusion. Electronically Signed   By: Fidela Salisbury M.D.   On: 08/30/2022 00:39   DG Chest Port 1 View  Result Date: 08/29/2022 CLINICAL DATA:  Fever.  Postoperative problem. EXAM: PORTABLE CHEST 1 VIEW COMPARISON:  06/18/2022 FINDINGS: The heart size and mediastinal contours are within normal limits. Both lungs are clear. The visualized skeletal structures are unremarkable. IMPRESSION: No active disease. Electronically Signed   By: Lucienne Capers M.D.   On: 08/29/2022 23:10        Scheduled Meds:  amLODipine  10 mg Oral Daily   calcitRIOL  0.25 mcg Oral Daily   gabapentin  300 mg Oral QHS   heparin  5,000 Units Subcutaneous Q8H   hydrALAZINE  10 mg Oral Daily   insulin aspart  0-6 Units Subcutaneous TID WC   pantoprazole  40 mg Oral QHS   sertraline  100 mg Oral QHS   sodium bicarbonate  1,300 mg Oral BID   sodium chloride flush  3 mL Intravenous Q12H   Continuous Infusions:  ceFEPime (MAXIPIME) IV 2 g (08/31/22 0905)   [START ON 09/01/2022] vancomycin       LOS: 1 day    Time spent: 51 minutes spent on chart review, discussion with nursing staff, consultants, updating family and interview/physical exam; more than 50% of that time was spent in counseling and/or  coordination of care.    Jacqueline Carboni J [place] DO [facility] Available via Epic secure chat 7am-7pm After these hours, please refer to coverage provider listed on [email or web address] 08/31/2022, 1:48 PM

## 2022-08-31 NOTE — TOC Initial Note (Signed)
Transition of Care [facility]) - Initial/Assessment Note    Patient Details  Name: Jacqueline [name] MRN: [id number] Date of Birth: July 07, 1981  Transition [facility] CM/SW Contact:    Carles Collet, RN Phone Number: 08/31/2022, 4:43 PM  Clinical Narrative:                  Patient admitted from home w spouse. Active w [facility] services, liaison notified of admission. TOC will continue follow for additional DC needs.  Expected Discharge Plan: [place] Barriers to Discharge: Continued Medical Work up   Patient Goals and CMS Choice            Expected Discharge Plan and Services   Discharge Planning Services: CM Consult                                 [facility] Agency: [place] Date [facility] Agency Contacted: 08/31/22 Time HH Agency Contacted: [phone] Representative spoke with at [place]: Tommi Rumps  Prior Living Arrangements/Services                       Activities of Daily Living Home Assistive Devices/Equipment: Environmental consultant (specify type), Wheelchair ADL Screening (condition at time of admission) Patient's cognitive ability adequate to safely complete daily activities?: Yes Is the patient deaf or have difficulty hearing?: No Does the patient have difficulty seeing, even when wearing glasses/contacts?: No Does the patient have difficulty concentrating, remembering, or making decisions?: No Patient able to express need for assistance with ADLs?: Yes Does the patient have difficulty dressing or bathing?: No Independently performs ADLs?: Yes (appropriate for developmental age) Does the patient have difficulty walking or climbing stairs?: Yes Weakness of Legs: Both Weakness of Arms/Hands: None  Permission Sought/Granted                  Emotional Assessment              Admission diagnosis:  Cellulitis of right leg [L03.115] Cellulitis of right lower extremity [L03.115] Patient Active Problem List   Diagnosis Date Noted   Cellulitis  of right leg 08/30/2022   Adjustment disorder with depressed mood 07/01/2022   Debility 06/29/2022   Necrotizing fasciitis ([place]) 06/29/2022   Fasciitis 06/18/2022   Viral illness 08/17/2020   Elevated d-dimer 08/17/2020   Acute lower UTI 08/17/2020   Hypoalbuminemia [id number]   Complicated migraine [id number]   Right hemiplegia ([place]) 12/02/2019   Hypertension    AKI (acute kidney injury) ([place])    Stage 5 chronic kidney disease not on chronic dialysis ([place])    Obesity, Class III, BMI 40-49.9 (morbid obesity) ([place])    Insulin dependent diabetes mellitus    Anemia of chronic disease    HLD (hyperlipidemia) 07/02/2018   Acute on chronic diastolic CHF (congestive heart failure) ([place]) 07/02/2018   Depression 07/02/2018   OSA (obstructive sleep apnea) 07/02/2018   Acute on chronic diastolic (congestive) heart failure ([place]) 07/02/2018   Cellulitis of right lower extremity 07/02/2018   Anemia 07/02/2018   Type 2 diabetes mellitus with hyperglycemia, with long-term current use of insulin ([place]) 07/02/2018   Chest pain 05/04/2018   Hypertensive urgency 05/03/2018   Left facial numbness 05/03/2018   Headache 05/03/2018   Elevated troponin 05/03/2018   Hyperlipidemia associated with type 2 diabetes mellitus ([place]) 03/08/2018   Hypertension associated with stage 3 chronic kidney disease due to type 2  diabetes mellitus ([place]) 03/08/2018   Vitamin D deficiency 12/21/2017   Generalized anxiety disorder 08/07/2017   Diabetic peripheral neuropathy ([place]) 06/21/2017   History of asthma 06/21/2017   Steatosis of liver 06/21/2017   Microalbuminuric diabetic nephropathy ([place]) 06/01/2017   Raised TSH level 06/01/2017   Thrombocytosis 06/01/2017   Morbid obesity with BMI of 40.0-44.9, adult ([place]) 05/30/2017   Allergic rhinitis 05/30/2017   PAF (paroxysmal atrial fibrillation) ([place]) 05/25/2017   Sepsis ([place]) 05/03/2017    Class: Present on Admission   Acute renal failure superimposed on stage 3  chronic kidney disease ([place]) 05/03/2017    Class: Stage 3   Neck pain    Acidosis, metabolic    Hyponatremia    Type II diabetes mellitus with renal manifestations ([place]) 04/30/2017   Normocytic anemia 04/30/2017   Staphylococcus aureus bacteremia 03/15/2016   Streptococcal bacteremia 03/15/2016   Tachycardia 03/14/2016   Fever 03/14/2016   Bad headache 03/14/2016   Morbid obesity ([place])    Diabetes ([place]) 01/01/2014   PCP:  Audley Hose, MD Pharmacy:   [facility] [address] [place] [address] Phone: [phone] Fax: [place], [address] [place] [address] Phone: [phone] Fax: [phone]     Social Determinants of Health (SDOH) Social History: SDOH Screenings   Food Insecurity: No Food Insecurity (08/30/2022)  Housing: Low Risk  (08/30/2022)  Transportation Needs: No Transportation Needs (08/30/2022)  Utilities: Not At Risk (08/30/2022)  Tobacco Use: Low Risk  (08/30/2022)   SDOH Interventions:     Readmission Risk Interventions     No data to display

## 2022-08-31 NOTE — Plan of Care (Signed)
  Problem: Coping: Goal: Ability to adjust to condition or change in health will improve Outcome: Progressing   Problem: Health Behavior/Discharge Planning: Goal: Ability to identify and utilize available resources and services will improve Outcome: Progressing Goal: Ability to manage health-related needs will improve Outcome: Progressing   Problem: Nutritional: Goal: Maintenance of adequate nutrition will improve Outcome: Progressing

## 2022-09-01 DIAGNOSIS — L03115 Cellulitis of right lower limb: Secondary | ICD-10-CM | POA: Diagnosis not present

## 2022-09-01 LAB — CBC
HCT: 25.7 % — ABNORMAL LOW (ref 36.0–46.0)
Hemoglobin: 8.4 g/dL — ABNORMAL LOW (ref 12.0–15.0)
MCH: 30.2 pg (ref 26.0–34.0)
MCHC: 32.7 g/dL (ref 30.0–36.0)
MCV: 92.4 fL (ref 80.0–100.0)
Platelets: 284 10*3/uL (ref 150–400)
RBC: 2.78 MIL/uL — ABNORMAL LOW (ref 3.87–5.11)
RDW: 14.2 % (ref 11.5–15.5)
WBC: 15.6 10*3/uL — ABNORMAL HIGH (ref 4.0–10.5)
nRBC: 0 % (ref 0.0–0.2)

## 2022-09-01 LAB — GLUCOSE, CAPILLARY
Glucose-Capillary: 118 mg/dL — ABNORMAL HIGH (ref 70–99)
Glucose-Capillary: 69 mg/dL — ABNORMAL LOW (ref 70–99)
Glucose-Capillary: 125 mg/dL — ABNORMAL HIGH (ref 70–99)

## 2022-09-01 LAB — BASIC METABOLIC PANEL
Anion gap: 9 (ref 5–15)
BUN: 48 mg/dL — ABNORMAL HIGH (ref 6–20)
CO2: 18 mmol/L — ABNORMAL LOW (ref 22–32)
Calcium: 7 mg/dL — ABNORMAL LOW (ref 8.9–10.3)
Chloride: 111 mmol/L (ref 98–111)
Creatinine, Ser: 4.3 mg/dL — ABNORMAL HIGH (ref 0.44–1.00)
GFR, Estimated: 13 mL/min — ABNORMAL LOW (ref >60)
Glucose, Bld: 92 mg/dL (ref 70–99)
Potassium: 4.6 mmol/L (ref 3.5–5.1)
Sodium: 138 mmol/L (ref 135–145)

## 2022-09-01 MED ORDER — CEPHALEXIN 500 MG PO CAPS: 500.0000 mg | ORAL_CAPSULE | Freq: Two times a day (BID) | ORAL | 0 refills | Status: AC

## 2022-09-01 MED ORDER — FOLIC ACID 1 MG PO TABS: 1.0000 mg | ORAL_TABLET | Freq: Every day | ORAL | 2 refills | Status: AC

## 2022-09-01 MED ORDER — LINEZOLID 600 MG PO TABS: 600.0000 mg | ORAL_TABLET | Freq: Two times a day (BID) | ORAL | 0 refills | Status: AC

## 2022-09-01 NOTE — Discharge Summary (Signed)
Physician Discharge Summary  Jacqueline [name] [id number] DOB: 06-18-1981 DOA: 08/29/2022  PCP: Audley Hose, MD  Admit date: 08/29/2022 Discharge date: 09/01/2022  Admitted From: Home Disposition: Home  Recommendations for Outpatient Follow-up:  Follow up with PCP in 1-2 weeks Follow-up with general surgery, Dr. Kieth Brightly as scheduled Outpatient follow-up with nephrology as scheduled Continue antibiotics and discharged with Zyvox and Keflex to complete total course of 10 days for right lower extremity cellulitis Please obtain BMP/CBC in one week  Home Health: RN/PT Equipment/Devices: None  Discharge Condition: Stable CODE STATUS: Full code Diet recommendation: Renal diet  History of present illness:  Jacqueline [name] is a 42 y.o. female with past medical history significant for HTN, DM2, CKD stage V, anemia of chronic medical/renal disease, depression/anxiety, recent history of necrotizing fasciitis 06/2022 who presented to [facility] ED on 2/12 with fever and right lower extremity pain.  Patient reports onset of fevers on 2/9 and worsening pain in the proximal right lower extremity, similar to symptoms she had previously in December.  Patient continues with wound VAC in place with minimal output and also reports this is causing her to have pain.   Admitted to the hospital December 2023 with necrotizing fasciitis involving the abdominal wall, pelvis and proximal right lower extremity and was taken to the OR for I&D on 12/2 and 12/4.  Completed a course of IV antibiotics followed by Augmentin.   In the ED, temperature 98.5 F, HR 86, RR 17, BP 159/91, SpO2 100% room air.  WBC 22.5, hemoglobin 8.3, platelets 243.  Sodium 134, potassium 4.3, chloride 107, CO2 18, glucose 122, BUN 53, creatinine 4.55.  AST 14, ALT 16, total bilirubin 0.6.  Lactic acid 0.7.  Chest x-ray with no active cardiopulmonary disease process.  CT femur right with moderate circumferential subcutaneous edema right lower  extremity consistent with cellulitis, no subcutaneous gas or loculated fluid collection identified.  Hospital course:  Right lower extremity cellulitis Hx necrotizing fasciitis s/p I&D Patient presenting to ED with fever, progressive right lower extremity pain.  Recent history of necrotizing fasciitis December 2023 s/p I&D. CT femur right with moderate circumferential subcutaneous edema right lower extremity consistent with cellulitis, no subcutaneous gas or loculated fluid collection identified.  Vascular duplex ultrasound right lower extremity negative for DVT.  Seen by general surgery with no surgical needs identified.  Wound VAC removed.  Patient was started on vancomycin and cefepime while inpatient and will transition to Zyvox and Keflex on discharge to complete total course of 10 days.  Continue wound careright leg: Cleanse right groin wound with saline, pat dry, cut to fit silver hydrofiber and place over wound bed. Top with dry dressings, secure with tape.  Outpatient follow-up with general surgery.   Essential hypertension Continue amlodipine 10 mg p.o. daily and Hydralazine 10 mg p.o. daily   Type 2 diabetes mellitus Hemoglobin A1c 5.4 06/21/2022, well-controlled.  Diet controlled at baseline.   CKD stage V Metabolic acidosis Follows with nephrology outpatient.  Baseline creatinine 4.8-5.0.  Creatinine 4.30 at time of discharge, stable.  Continue Sodium bicarbonate 1300 mg p.o. twice daily.  Outpatient follow-up with nephrology.  Recommend BMP 1 week.   Anemia of chronic medical/renal disease Folate deficiency Anemia panel with iron 11, TIBC 129, ferritin 141, folate 5.3, vitamin B12 490.  Started on folic acid 1 mg p.o. daily.  CBC 1 week.   Depression/anxiety Continue sertraline 100 mg p.o. daily   GERD: Continue Protonix 40 mg p.o. daily   Obesity Body mass  index is 30.56 kg/m.  Discussed with patient needs for aggressive lifestyle changes/weight loss as this complicates  all facets of care.  Outpatient follow-up with PCP.   Discharge Diagnoses:  Principal Problem:   Cellulitis of right leg Active Problems:   Type II diabetes mellitus with renal manifestations (HCC)   Normocytic anemia   Acidosis, metabolic   Depression   Stage 5 chronic kidney disease not on chronic dialysis ([place])   Hypertension   Morbid obesity with BMI of 40.0-44.9, adult [facility])    Discharge Instructions  Discharge Instructions     Call MD for:  difficulty breathing, headache or visual disturbances   Complete by: As directed    Call MD for:  extreme fatigue   Complete by: As directed    Call MD for:  persistant dizziness or light-headedness   Complete by: As directed    Call MD for:  persistant nausea and vomiting   Complete by: As directed    Call MD for:  severe uncontrolled pain   Complete by: As directed    Call MD for:  temperature >100.4   Complete by: As directed    Diet - low sodium heart healthy   Complete by: As directed    Discharge wound care:   Complete by: As directed    Cleanse right groin wound with saline, pat dry. Cut to fit silver hydrofiber (Aquacel Ag+) and place over wound bed Top with dry dressings, secure with tape. Change daily   Increase activity slowly   Complete by: As directed       Allergies as of 09/01/2022   No Known Allergies      Medication List     STOP taking these medications    HYDROcodone-acetaminophen 5-325 MG tablet Commonly known as: NORCO/VICODIN   Lokelma 10 g Pack packet Generic drug: sodium zirconium cyclosilicate   losartan 50 MG tablet Commonly known as: COZAAR   nystatin powder Commonly known as: MYCOSTATIN/NYSTOP       TAKE these medications    acetaminophen 325 MG tablet Commonly known as: TYLENOL Take 1-2 tablets (325-650 mg total) by mouth every 4 (four) hours as needed for mild pain.   amLODipine 10 MG tablet Commonly known as: NORVASC Take 1 tablet (10 mg total) by mouth daily.    calcitRIOL 0.25 MCG capsule Commonly known as: ROCALTROL Take 1 capsule (0.25 mcg total) by mouth daily.   cephALEXin 500 MG capsule Commonly known as: KEFLEX Take 1 capsule (500 mg total) by mouth 2 (two) times daily for 7 days. Start taking on: February 16, 123456   folic acid 1 MG tablet Commonly known as: FOLVITE Take 1 tablet (1 mg total) by mouth daily.   furosemide 40 MG tablet Commonly known as: LASIX Take 1 tablet (40 mg total) by mouth daily. What changed: how much to take   gabapentin 300 MG capsule Commonly known as: NEURONTIN Take 1 capsule (300 mg total) by mouth at bedtime.   gentamicin cream 0.1 % Commonly known as: GARAMYCIN Apply 1 Application topically in the morning and at bedtime. Right foot   hydrALAZINE 10 MG tablet Commonly known as: APRESOLINE Take 1 tablet (10 mg total) by mouth daily.   linezolid 600 MG tablet Commonly known as: Zyvox Take 1 tablet (600 mg total) by mouth 2 (two) times daily for 7 days. Start taking on: September 02, 2022   oxyCODONE-acetaminophen 10-325 MG tablet Commonly known as: PERCOCET Take 1 tablet by mouth every 6 (six) hours  as needed for pain.   pantoprazole 40 MG tablet Commonly known as: PROTONIX Take 1 tablet (40 mg total) by mouth at bedtime.   sertraline 100 MG tablet Commonly known as: ZOLOFT Take 1 tablet (100 mg total) by mouth at bedtime.   sodium bicarbonate 650 MG tablet Take 2 tablets (1,300 mg total) by mouth 2 (two) times daily.   Vitamin D (Ergocalciferol) 1.25 MG (50000 UNIT) Caps capsule Commonly known as: DRISDOL Take 1 capsule (50,000 Units total) by mouth every Thursday.               Discharge Care Instructions  (From admission, onward)           Start     Ordered   09/01/22 0000  Discharge wound care:       Comments: Cleanse right groin wound with saline, pat dry. Cut to fit silver hydrofiber (Aquacel Ag+) and place over wound bed Top with dry dressings, secure with tape.  Change daily   09/01/22 1013            Follow-up Information     Bakare, Mobolaji B, MD. Schedule an appointment as soon as possible for a visit in 1 week(s).   Specialty: Internal Medicine Contact information: [place] [address] [phone]         Kinsinger, Arta Bruce, MD. Schedule an appointment as soon as possible for a visit.   Specialty: General Surgery Contact information: [id number] [place] [address] [phone]                No Known Allergies  Consultations: None   Procedures/Studies: VAS [place] LOWER EXTREMITY VENOUS (DVT)  Result Date: 08/31/2022  Lower Venous DVT Study Patient Name:  Jacqueline [name]  Date of Exam:   08/31/2022 Medical Rec #: [id number]     Accession #:    [id number] Date of Birth: [birth year]     Patient Gender: F Patient Age:   42 years Exam Location:  [facility] Procedure:      VAS [place] LOWER EXTREMITY VENOUS (DVT) Referring Phys: Laurey Arrow --------------------------------------------------------------------------------  Indications: Swelling.  Limitations: Poor ultrasound/tissue interface, bandages, open wound and patient pain tolerance. Comparison Study: 07/25/2022 - No evidence of deep vein thrombosis seen in the                   lower extremities. Please see limitation of study as noted                   above. Performing Technologist: Oliver Hum RVT  Examination Guidelines: A complete evaluation includes B-mode imaging, spectral Doppler, color Doppler, and power Doppler as needed of all accessible portions of each vessel. Bilateral testing is considered an integral part of a complete examination. Limited examinations for reoccurring indications may be performed as noted. The reflux portion of the exam is performed with the patient in reverse Trendelenburg.  +---------+---------------+---------+-----------+----------+-------------------+ RIGHT     CompressibilityPhasicitySpontaneityPropertiesThrombus Aging      +---------+---------------+---------+-----------+----------+-------------------+ CFV      Full           Yes      Yes                                      +---------+---------------+---------+-----------+----------+-------------------+ SFJ      Full                                                             +---------+---------------+---------+-----------+----------+-------------------+  FV Prox  Full                                                             +---------+---------------+---------+-----------+----------+-------------------+ FV Mid   Full           Yes      Yes                                      +---------+---------------+---------+-----------+----------+-------------------+ FV Distal               Yes      Yes                                      +---------+---------------+---------+-----------+----------+-------------------+ PFV      Full                                                             +---------+---------------+---------+-----------+----------+-------------------+ POP      Full           Yes      Yes                                      +---------+---------------+---------+-----------+----------+-------------------+ PTV      Full                                                             +---------+---------------+---------+-----------+----------+-------------------+ PERO                                                  Patency shown with                                                        color doppler       +---------+---------------+---------+-----------+----------+-------------------+   +----+---------------+---------+-----------+----------+--------------+ LEFTCompressibilityPhasicitySpontaneityPropertiesThrombus Aging +----+---------------+---------+-----------+----------+--------------+ CFV Full           Yes      Yes                                  +----+---------------+---------+-----------+----------+--------------+     Summary: RIGHT: - There is no evidence of deep vein thrombosis in the lower extremity. However, portions of this examination were limited- see technologist comments above.  - No cystic structure found in the popliteal fossa.  LEFT: - No evidence of common femoral vein obstruction.  *See table(s) above for measurements and observations. Electronically signed by Monica Martinez MD on 08/31/2022 at 2:43:03 PM.    Final    CT FEMUR RIGHT WO CONTRAST  Result Date: 08/30/2022 CLINICAL DATA:  Soft tissue infection, fever, right leg pain EXAM: CT OF THE LOWER RIGHT EXTREMITY WITHOUT CONTRAST TECHNIQUE: Multidetector CT imaging of the right lower extremity was performed according to the standard protocol. RADIATION DOSE REDUCTION: This exam was performed according to the departmental dose-optimization program which includes automated exposure control, adjustment of the mA and/or kV according to patient size and/or use of iterative reconstruction technique. COMPARISON:  06/18/2022 FINDINGS: Bones/Joint/Cartilage Normal alignment. No acute fracture or dislocation. Mild right hip degenerative arthritis. No erosions or abnormal periosteal reaction. Ligaments Suboptimally assessed by CT. Muscles and Tendons Postsurgical changes are seen within the subcutaneous soft tissues of the anterior, proximal right thigh and extend to involve the proximal sartorius. Mild generalized muscular atrophy. Previously noted inter- and intramuscular gas has resolved. No intramuscular inflammatory changes are identified. Soft tissues There is moderate circumferential subcutaneous edema throughout the right lower extremity which may reflect changes of asymmetric edema or a superimposed inflammatory process such as cellulitis. No subcutaneous gas or loculated fluid collection is identified. Extensive arteriosclerosis noted. Small right knee  effusion noted. IMPRESSION: 1. Moderate circumferential subcutaneous edema throughout the right lower extremity which may reflect changes of asymmetric edema or a superimposed inflammatory process such as cellulitis. No subcutaneous gas or loculated fluid collection identified. 2. Postsurgical changes within the subcutaneous soft tissues of the anterior, proximal right thigh and extend to involve the proximal sartorius. Previously noted inter and intramuscular gas has resolved. 3. Mild right hip degenerative arthritis. 4. Small right knee effusion. Electronically Signed   By: Fidela Salisbury M.D.   On: 08/30/2022 00:39   DG Chest Port 1 View  Result Date: 08/29/2022 CLINICAL DATA:  Fever.  Postoperative problem. EXAM: PORTABLE CHEST 1 VIEW COMPARISON:  06/18/2022 FINDINGS: The heart size and mediastinal contours are within normal limits. Both lungs are clear. The visualized skeletal structures are unremarkable. IMPRESSION: No active disease. Electronically Signed   By: Lucienne Capers M.D.   On: 08/29/2022 23:10     Subjective: Patient seen examined bedside, resting calmly.  Sitting at edge of bed.  Family present.  No specific complaints this morning.  WBC count now down to 15.7.  Discharging home with continue antibiotics with Zyvox/Keflex to complete total course of 10 days.  Patient states needs to reschedule her appointment with general surgery.  No other questions or concerns at this time.  Denies headache, no visual changes, no chest pain, no palpitations, no fever/chills/night sweats, no nausea/vomiting/diarrhea, no focal weakness, no fatigue, no paresthesias.  No acute events overnight per nursing staff.  Discharge Exam: Vitals:   09/01/22 0339 09/01/22 0803  BP: 119/75 (!) 147/85  Pulse: 84 85  Resp: 16 18  Temp: 97.9 F (36.6 C) (!) 97.5 F (36.4 C)  SpO2: 100% 93%   Vitals:   08/31/22 2028 09/01/22 0339 09/01/22 0500 09/01/22 0803  BP: (!) 141/82 119/75  (!) 147/85  Pulse: 91 84   85  Resp: 18 16  18  $ Temp: 98.1 F (36.7 C) 97.9 F (36.6 C)  (!) 97.5 F (36.4 C)  TempSrc: Oral Oral  Oral  SpO2: 100% 100%  93%  Weight:   84.3 kg   Height:        Physical Exam:  GEN: NAD, alert and oriented x 3, obese HEENT: NCAT, PERRL, EOMI, sclera clear, MMM PULM: CTAB w/o wheezes/crackles, normal respiratory effort, on room air CV: RRR w/o M/G/R GI: abd soft, NTND, NABS, no R/G/M MSK: no peripheral edema, muscle strength globally intact 5/5 bilateral upper/lower extremities NEURO: CN II-XII intact, no focal deficits, sensation to light touch intact PSYCH: normal mood/affect Integumentary: Right lower extremity thigh wound noted with dressing in place; clean/dry/intact, otherwise no other concerning rashes/lesions/wounds noted on exposed skin surfaces.      The results of significant diagnostics from this hospitalization (including imaging, microbiology, ancillary and laboratory) are listed below for reference.     Microbiology: Recent Results (from the past 240 hour(s))  Culture, blood (Routine X 2) w Reflex to ID Panel     Status: None (Preliminary result)   Collection Time: 08/29/22 10:02 PM   Specimen: BLOOD  Result Value Ref Range Status   Specimen Description BLOOD LEFT ANTECUBITAL  Final   Special Requests   Final    BOTTLES DRAWN AEROBIC AND ANAEROBIC Blood Culture results may not be optimal due to an excessive volume of blood received in culture bottles   Culture   Final    NO GROWTH 3 DAYS Performed at [facility], [place] [address] [place], [place] [address]    Report Status PENDING  Incomplete  Culture, blood (Routine X 2) w Reflex to ID Panel     Status: None (Preliminary result)   Collection Time: 08/29/22 10:07 PM   Specimen: BLOOD  Result Value Ref Range Status   Specimen Description BLOOD BLOOD LEFT HAND  Final   Special Requests   Final    BOTTLES DRAWN AEROBIC AND ANAEROBIC Blood Culture results may not be optimal due to an inadequate  volume of blood received in culture bottles   Culture   Final    NO GROWTH 3 DAYS Performed at [facility], [place] [address], [place], [place] [address]    Report Status PENDING  Incomplete     Labs: BNP (last 3 results) No results for input(s): "BNP" in the last 8760 hours. Basic Metabolic Panel: Recent Labs  Lab 08/29/22 1910 08/30/22 0446 08/31/22 0028 09/01/22 0356  NA 134* 134* 135 138  K 4.3 3.7 3.8 4.6  CL 107 107 110 111  CO2 18* 16* 17* 18*  GLUCOSE 122* 132* 105* 92  BUN 53* 53* 50* 48*  CREATININE 4.55* 4.32* 4.19* 4.30*  CALCIUM 8.0* 7.6* 7.0* 7.0*  MG  --  1.7  --   --   PHOS  --  4.9*  --   --    Liver Function Tests: Recent Labs  Lab 08/29/22 1910  AST 14*  ALT 16  ALKPHOS 72  BILITOT 0.6  PROT 7.1  ALBUMIN 2.3*   No results for input(s): "LIPASE", "AMYLASE" in the last 168 hours. No results for input(s): "AMMONIA" in the last 168 hours. CBC: Recent Labs  Lab 08/29/22 1210 08/29/22 1910 08/30/22 0446 08/31/22 0028 09/01/22 0356  WBC  --  22.5* 20.4* 18.8* 15.6*  NEUTROABS  --  19.0*  --   --   --   HGB 8.0* 8.3* 7.3* 7.0* 8.4*  HCT  --  25.5* 22.1* 21.0* 25.7*  MCV  --  93.1 92.9 90.9 92.4  PLT  --  243 202 221 284   Cardiac Enzymes: No results for input(s): "CKTOTAL", "CKMB", "CKMBINDEX", "TROPONINI" in the last 168 hours. BNP: Invalid input(s): "POCBNP" CBG:  Recent Labs  Lab 08/31/22 1152 08/31/22 1555 08/31/22 2029 09/01/22 0804 09/01/22 0826  GLUCAP 127* 87 142* 69* 118*   D-Dimer No results for input(s): "DDIMER" in the last 72 hours. Hgb A1c No results for input(s): "HGBA1C" in the last 72 hours. Lipid Profile No results for input(s): "CHOL", "HDL", "LDLCALC", "TRIG", "CHOLHDL", "LDLDIRECT" in the last 72 hours. Thyroid function studies No results for input(s): "TSH", "T4TOTAL", "T3FREE", "THYROIDAB" in the last 72 hours.  Invalid input(s): "FREET3" Anemia work up Recent Labs    08/30/22 0443 08/31/22 0026   VITAMINB12 490  --   FOLATE  --  5.3*  FERRITIN 141  --   TIBC 129*  --   IRON 11*  --   RETICCTPCT  --  2.1   Urinalysis    Component Value Date/Time   COLORURINE YELLOW 06/18/2022 0820   APPEARANCEUR HAZY (A) 06/18/2022 0820   LABSPEC 1.013 06/18/2022 0820   PHURINE 7.0 06/18/2022 0820   GLUCOSEU 50 (A) 06/18/2022 0820   HGBUR NEGATIVE 06/18/2022 0820   BILIRUBINUR NEGATIVE 06/18/2022 0820   KETONESUR NEGATIVE 06/18/2022 0820   PROTEINUR >=300 (A) 06/18/2022 0820   UROBILINOGEN 0.2 08/21/2012 2130   NITRITE NEGATIVE 06/18/2022 0820   LEUKOCYTESUR TRACE (A) 06/18/2022 0820   Sepsis Labs Recent Labs  Lab 08/29/22 1910 08/30/22 0446 08/31/22 0028 09/01/22 0356  WBC 22.5* 20.4* 18.8* 15.6*   Microbiology Recent Results (from the past 240 hour(s))  Culture, blood (Routine X 2) w Reflex to ID Panel     Status: None (Preliminary result)   Collection Time: 08/29/22 10:02 PM   Specimen: BLOOD  Result Value Ref Range Status   Specimen Description BLOOD LEFT ANTECUBITAL  Final   Special Requests   Final    BOTTLES DRAWN AEROBIC AND ANAEROBIC Blood Culture results may not be optimal due to an excessive volume of blood received in culture bottles   Culture   Final    NO GROWTH 3 DAYS Performed at [facility], [place] [address], [place], [place] [address]    Report Status PENDING  Incomplete  Culture, blood (Routine X 2) w Reflex to ID Panel     Status: None (Preliminary result)   Collection Time: 08/29/22 10:07 PM   Specimen: BLOOD  Result Value Ref Range Status   Specimen Description BLOOD BLOOD LEFT HAND  Final   Special Requests   Final    BOTTLES DRAWN AEROBIC AND ANAEROBIC Blood Culture results may not be optimal due to an inadequate volume of blood received in culture bottles   Culture   Final    NO GROWTH 3 DAYS Performed at [facility], [place] [address], [place], [place] [address]    Report Status PENDING  Incomplete     Time coordinating  discharge: Over 30 minutes  SIGNED:   Donnamarie Poag [place] DO  [facility] 09/01/2022, 10:17 AM

## 2022-09-01 NOTE — Plan of Care (Signed)
Discharge today.

## 2022-09-01 NOTE — TOC CM/SW Note (Signed)
Updated Jacqueline [name] with [facility] discharge is today today

## 2022-09-01 NOTE — Progress Notes (Signed)
   09/01/22 1154  Mobility  Activity Ambulated with assistance in hallway  Level of Assistance Contact guard assist, steadying assist  Assistive Device Front wheel walker  Distance Ambulated (ft) 250 ft  Activity Response Tolerated well  Mobility Referral Yes  $Mobility charge 1 Mobility   Mobility Specialist Progress Note  Pt was in bed and agreeable. Had no c/o pain. Returned to bed w/ all needs met and call bell in reach.  Lucious Groves Mobility Specialist  Please contact via SecureChat or Rehab office at [phone]

## 2022-09-02 DIAGNOSIS — T8189XA Other complications of procedures, not elsewhere classified, initial encounter: Secondary | ICD-10-CM | POA: Diagnosis not present

## 2022-09-03 DIAGNOSIS — T8189XA Other complications of procedures, not elsewhere classified, initial encounter: Secondary | ICD-10-CM | POA: Diagnosis not present

## 2022-09-03 LAB — CULTURE, BLOOD (ROUTINE X 2)
Culture: NO GROWTH
Culture: NO GROWTH

## 2022-09-04 DIAGNOSIS — Z4801 Encounter for change or removal of surgical wound dressing: Secondary | ICD-10-CM | POA: Diagnosis not present

## 2022-09-04 DIAGNOSIS — Z6830 Body mass index (BMI) 30.0-30.9, adult: Secondary | ICD-10-CM | POA: Diagnosis not present

## 2022-09-04 DIAGNOSIS — Z8711 Personal history of peptic ulcer disease: Secondary | ICD-10-CM | POA: Diagnosis not present

## 2022-09-04 DIAGNOSIS — E875 Hyperkalemia: Secondary | ICD-10-CM | POA: Diagnosis not present

## 2022-09-04 DIAGNOSIS — N179 Acute kidney failure, unspecified: Secondary | ICD-10-CM | POA: Diagnosis not present

## 2022-09-04 DIAGNOSIS — Z4789 Encounter for other orthopedic aftercare: Secondary | ICD-10-CM | POA: Diagnosis not present

## 2022-09-04 DIAGNOSIS — I509 Heart failure, unspecified: Secondary | ICD-10-CM | POA: Diagnosis not present

## 2022-09-04 DIAGNOSIS — F4323 Adjustment disorder with mixed anxiety and depressed mood: Secondary | ICD-10-CM | POA: Diagnosis not present

## 2022-09-04 DIAGNOSIS — D631 Anemia in chronic kidney disease: Secondary | ICD-10-CM | POA: Diagnosis not present

## 2022-09-04 DIAGNOSIS — N184 Chronic kidney disease, stage 4 (severe): Secondary | ICD-10-CM | POA: Diagnosis not present

## 2022-09-04 DIAGNOSIS — I130 Hypertensive heart and chronic kidney disease with heart failure and stage 1 through stage 4 chronic kidney disease, or unspecified chronic kidney disease: Secondary | ICD-10-CM | POA: Diagnosis not present

## 2022-09-04 DIAGNOSIS — Z9884 Bariatric surgery status: Secondary | ICD-10-CM | POA: Diagnosis not present

## 2022-09-04 DIAGNOSIS — E1122 Type 2 diabetes mellitus with diabetic chronic kidney disease: Secondary | ICD-10-CM | POA: Diagnosis not present

## 2022-09-04 DIAGNOSIS — G4730 Sleep apnea, unspecified: Secondary | ICD-10-CM | POA: Diagnosis not present

## 2022-09-04 DIAGNOSIS — T8189XA Other complications of procedures, not elsewhere classified, initial encounter: Secondary | ICD-10-CM | POA: Diagnosis not present

## 2022-09-04 DIAGNOSIS — J42 Unspecified chronic bronchitis: Secondary | ICD-10-CM | POA: Diagnosis not present

## 2022-09-05 DIAGNOSIS — T8189XA Other complications of procedures, not elsewhere classified, initial encounter: Secondary | ICD-10-CM | POA: Diagnosis not present

## 2022-09-06 DIAGNOSIS — D631 Anemia in chronic kidney disease: Secondary | ICD-10-CM | POA: Diagnosis not present

## 2022-09-06 DIAGNOSIS — J42 Unspecified chronic bronchitis: Secondary | ICD-10-CM | POA: Diagnosis not present

## 2022-09-06 DIAGNOSIS — E1122 Type 2 diabetes mellitus with diabetic chronic kidney disease: Secondary | ICD-10-CM | POA: Diagnosis not present

## 2022-09-06 DIAGNOSIS — I130 Hypertensive heart and chronic kidney disease with heart failure and stage 1 through stage 4 chronic kidney disease, or unspecified chronic kidney disease: Secondary | ICD-10-CM | POA: Diagnosis not present

## 2022-09-06 DIAGNOSIS — Z9884 Bariatric surgery status: Secondary | ICD-10-CM | POA: Diagnosis not present

## 2022-09-06 DIAGNOSIS — E875 Hyperkalemia: Secondary | ICD-10-CM | POA: Diagnosis not present

## 2022-09-06 DIAGNOSIS — I509 Heart failure, unspecified: Secondary | ICD-10-CM | POA: Diagnosis not present

## 2022-09-06 DIAGNOSIS — T8189XA Other complications of procedures, not elsewhere classified, initial encounter: Secondary | ICD-10-CM | POA: Diagnosis not present

## 2022-09-06 DIAGNOSIS — F4323 Adjustment disorder with mixed anxiety and depressed mood: Secondary | ICD-10-CM | POA: Diagnosis not present

## 2022-09-06 DIAGNOSIS — N179 Acute kidney failure, unspecified: Secondary | ICD-10-CM | POA: Diagnosis not present

## 2022-09-06 DIAGNOSIS — Z4801 Encounter for change or removal of surgical wound dressing: Secondary | ICD-10-CM | POA: Diagnosis not present

## 2022-09-06 DIAGNOSIS — N184 Chronic kidney disease, stage 4 (severe): Secondary | ICD-10-CM | POA: Diagnosis not present

## 2022-09-06 DIAGNOSIS — Z8711 Personal history of peptic ulcer disease: Secondary | ICD-10-CM | POA: Diagnosis not present

## 2022-09-06 DIAGNOSIS — Z4789 Encounter for other orthopedic aftercare: Secondary | ICD-10-CM | POA: Diagnosis not present

## 2022-09-06 DIAGNOSIS — Z6830 Body mass index (BMI) 30.0-30.9, adult: Secondary | ICD-10-CM | POA: Diagnosis not present

## 2022-09-06 DIAGNOSIS — G4730 Sleep apnea, unspecified: Secondary | ICD-10-CM | POA: Diagnosis not present

## 2022-09-07 DIAGNOSIS — T8189XA Other complications of procedures, not elsewhere classified, initial encounter: Secondary | ICD-10-CM | POA: Diagnosis not present

## 2022-09-07 DIAGNOSIS — M726 Necrotizing fasciitis: Secondary | ICD-10-CM | POA: Diagnosis not present

## 2022-09-08 ENCOUNTER — Ambulatory Visit (INDEPENDENT_AMBULATORY_CARE_PROVIDER_SITE_OTHER): Payer: BC Managed Care – PPO | Admitting: Vascular Surgery

## 2022-09-08 ENCOUNTER — Ambulatory Visit (INDEPENDENT_AMBULATORY_CARE_PROVIDER_SITE_OTHER)
Admission: RE | Admit: 2022-09-08 | Discharge: 2022-09-08 | Disposition: A | Discharge status: Home / Self Care | Payer: BC Managed Care – PPO | Source: Ambulatory Visit | Attending: Vascular Surgery | Admitting: Vascular Surgery

## 2022-09-08 ENCOUNTER — Other Ambulatory Visit (HOSPITAL_COMMUNITY): Payer: Self-pay | Admitting: *Deleted

## 2022-09-08 ENCOUNTER — Other Ambulatory Visit: Payer: Self-pay | Admitting: *Deleted

## 2022-09-08 ENCOUNTER — Ambulatory Visit (HOSPITAL_COMMUNITY)
Admission: RE | Admit: 2022-09-08 | Discharge: 2022-09-08 | Disposition: A | Discharge status: Home / Self Care | Payer: BC Managed Care – PPO | Source: Ambulatory Visit | Attending: Vascular Surgery | Admitting: Vascular Surgery

## 2022-09-08 ENCOUNTER — Encounter: Payer: Self-pay | Admitting: Vascular Surgery

## 2022-09-08 VITALS — BP 171/98 | HR 78 | Temp 97.9°F | Resp 20 | Ht 65.0 in | Wt 189.0 lb

## 2022-09-08 DIAGNOSIS — N185 Chronic kidney disease, stage 5: Secondary | ICD-10-CM | POA: Diagnosis not present

## 2022-09-08 DIAGNOSIS — I509 Heart failure, unspecified: Secondary | ICD-10-CM | POA: Diagnosis not present

## 2022-09-08 DIAGNOSIS — D631 Anemia in chronic kidney disease: Secondary | ICD-10-CM | POA: Diagnosis not present

## 2022-09-08 DIAGNOSIS — N179 Acute kidney failure, unspecified: Secondary | ICD-10-CM | POA: Diagnosis not present

## 2022-09-08 DIAGNOSIS — N184 Chronic kidney disease, stage 4 (severe): Secondary | ICD-10-CM

## 2022-09-08 DIAGNOSIS — Z8711 Personal history of peptic ulcer disease: Secondary | ICD-10-CM | POA: Diagnosis not present

## 2022-09-08 DIAGNOSIS — G4730 Sleep apnea, unspecified: Secondary | ICD-10-CM | POA: Diagnosis not present

## 2022-09-08 DIAGNOSIS — F4323 Adjustment disorder with mixed anxiety and depressed mood: Secondary | ICD-10-CM | POA: Diagnosis not present

## 2022-09-08 DIAGNOSIS — Z4801 Encounter for change or removal of surgical wound dressing: Secondary | ICD-10-CM | POA: Diagnosis not present

## 2022-09-08 DIAGNOSIS — I130 Hypertensive heart and chronic kidney disease with heart failure and stage 1 through stage 4 chronic kidney disease, or unspecified chronic kidney disease: Secondary | ICD-10-CM | POA: Diagnosis not present

## 2022-09-08 DIAGNOSIS — J42 Unspecified chronic bronchitis: Secondary | ICD-10-CM | POA: Diagnosis not present

## 2022-09-08 DIAGNOSIS — Z9884 Bariatric surgery status: Secondary | ICD-10-CM | POA: Diagnosis not present

## 2022-09-08 DIAGNOSIS — E875 Hyperkalemia: Secondary | ICD-10-CM | POA: Diagnosis not present

## 2022-09-08 DIAGNOSIS — Z4789 Encounter for other orthopedic aftercare: Secondary | ICD-10-CM | POA: Diagnosis not present

## 2022-09-08 DIAGNOSIS — Z6830 Body mass index (BMI) 30.0-30.9, adult: Secondary | ICD-10-CM | POA: Diagnosis not present

## 2022-09-08 DIAGNOSIS — E1122 Type 2 diabetes mellitus with diabetic chronic kidney disease: Secondary | ICD-10-CM | POA: Diagnosis not present

## 2022-09-08 NOTE — Progress Notes (Signed)
ASSESSMENT & PLAN   STAGE IV CHRONIC KIDNEY DISEASE: We have been asked to evaluate her for hemodialysis access.  Based on her vein map, she is not a candidate for an AV fistula.  Therefore if her kidney failure progresses she would require placement of an AV graft.  We have discussed these 2 different options and she understands why we would wait before placing an AV graft.  In addition, she is recovering from an episode of necrotizing fasciitis and these wounds in the right leg have almost completely healed.  We would certainly want this all healed up before considering placing a graft.  REASON FOR CONSULT:    To evaluate for hemodialysis access.  The consult is requested by Dr. Carolin Sicks.   HPI:   Jacqueline [name] is a 42 y.o. female who is referred for evaluation for hemodialysis access.  We were asked to place an AV fistula if possible.  If not possible we were asked to not place an AV graft.  She is right-handed.  I have reviewed the records from the referring office.  The patient was last seen on 08/29/2022.  She has a history of diabetes and hypertension.  She also has a history of diastolic congestive heart failure, anemia, obstructive sleep apnea, and a recent hospitalization for necrotizing fasciitis of the right leg.  She has not had any previous access procedures.  She has not had any previous catheters.  She does not have a pacemaker.  She is not on any blood thinners.  Past Medical History:  Diagnosis Date   Anemia    Anxiety    Chronic bronchitis (HCC)    Chronic kidney disease    Hypertension    Increased frequency of headaches    Morbid obesity ([place])    Necrotizing fasciitis ([place])    Sleep apnea    Type II diabetes mellitus ([place])     Family History  Problem Relation Age of Onset   Diabetes Mother    Hypertension Mother    Thyroid disease Mother    Kidney disease Maternal Grandmother    Diabetes Maternal Grandmother    Heart attack Other     SOCIAL  HISTORY: Social History   Tobacco Use   Smoking status: Never   Smokeless tobacco: Never  Substance Use Topics   Alcohol use: No    No Known Allergies  Current Outpatient Medications  Medication Sig Dispense Refill   acetaminophen (TYLENOL) 325 MG tablet Take 1-2 tablets (325-650 mg total) by mouth every 4 (four) hours as needed for mild pain.     amLODipine (NORVASC) 10 MG tablet Take 1 tablet (10 mg total) by mouth daily. 30 tablet 0   calcitRIOL (ROCALTROL) 0.25 MCG capsule Take 1 capsule (0.25 mcg total) by mouth daily. 30 capsule 0   cephALEXin (KEFLEX) 500 MG capsule Take 1 capsule (500 mg total) by mouth 2 (two) times daily for 7 days. 14 capsule 0   folic acid (FOLVITE) 1 MG tablet Take 1 tablet (1 mg total) by mouth daily. 30 tablet 2   furosemide (LASIX) 40 MG tablet Take 1 tablet (40 mg total) by mouth daily. (Patient taking differently: Take 80 mg by mouth daily.) 30 tablet 0   gabapentin (NEURONTIN) 300 MG capsule Take 1 capsule (300 mg total) by mouth at bedtime. 30 capsule 0   gentamicin cream (GARAMYCIN) 0.1 % Apply 1 Application topically in the morning and at bedtime. Right foot     hydrALAZINE (APRESOLINE) 10 MG  tablet Take 1 tablet (10 mg total) by mouth daily. 30 tablet 0   linezolid (ZYVOX) 600 MG tablet Take 1 tablet (600 mg total) by mouth 2 (two) times daily for 7 days. 14 tablet 0   oxyCODONE-acetaminophen (PERCOCET) 10-325 MG tablet Take 1 tablet by mouth every 6 (six) hours as needed for pain.     pantoprazole (PROTONIX) 40 MG tablet Take 1 tablet (40 mg total) by mouth at bedtime. 30 tablet 0   sertraline (ZOLOFT) 100 MG tablet Take 1 tablet (100 mg total) by mouth at bedtime. 30 tablet 0   sodium bicarbonate 650 MG tablet Take 2 tablets (1,300 mg total) by mouth 2 (two) times daily. 120 tablet 0   Vitamin D, Ergocalciferol, (DRISDOL) 1.25 MG (50000 UNIT) CAPS capsule Take 1 capsule (50,000 Units total) by mouth every Thursday. 5 capsule 0   No current  facility-administered medications for this visit.    REVIEW OF SYSTEMS:  [X]$  denotes positive finding, [ ]$  denotes negative finding Cardiac  Comments:  Chest pain or chest pressure:    Shortness of breath upon exertion:    Short of breath when lying flat:    Irregular heart rhythm:        Vascular    Pain in calf, thigh, or hip brought on by ambulation:    Pain in feet at night that wakes you up from your sleep:     Blood clot in your veins:    Leg swelling:         Pulmonary    Oxygen at home:    Productive cough:     Wheezing:         Neurologic    Sudden weakness in arms or legs:     Sudden numbness in arms or legs:     Sudden onset of difficulty speaking or slurred speech:    Temporary loss of vision in one eye:     Problems with dizziness:         Gastrointestinal    Blood in stool:     Vomited blood:         Genitourinary    Burning when urinating:     Blood in urine:        Psychiatric    Major depression:         Hematologic    Bleeding problems:    Problems with blood clotting too easily:        Skin    Rashes or ulcers:        Constitutional    Fever or chills:    -  PHYSICAL EXAM:   Vitals:   09/08/22 1014  BP: (!) 171/98  Pulse: 78  Resp: 20  Temp: 97.9 F (36.6 C)  SpO2: 96%  Weight: 189 lb (85.7 kg)  Height: 5' 5"$  (1.651 m)   Body mass index is 31.45 kg/m. GENERAL: The patient is a well-nourished female, in no acute distress. The vital signs are documented above. CARDIAC: There is a regular rate and rhythm.  VASCULAR: She has palpable radial pulses bilaterally. She has mild bilateral lower extremity swelling. PULMONARY: There is good air exchange bilaterally without wheezing or rales. ABDOMEN: Soft and non-tender with normal pitched bowel sounds.  MUSCULOSKELETAL: There are no major deformities. NEUROLOGIC: No focal weakness or paresthesias are detected. SKIN: There are no ulcers or rashes noted. PSYCHIATRIC: The patient has a  normal affect.  DATA:    ARTERIAL DUPLEX: I have independently interpreted her  upper extremity arterial duplex scan today.  On the right side the brachial artery measures 5.2 mm in diameter.  There is a triphasic radial and ulnar signal with the Doppler.  On the left side the brachial artery measures 4.2 mm in diameter.  There is a triphasic radial and ulnar waveform with the Doppler.  I have independently interpreted her upper extremity vein map.  Neither the cephalic vein or basilic vein on the right appear to be adequate for fistula.  Likewise on the left side there is acute thrombus in the distal upper arm and antecubital fossa in the cephalic vein.  The vein is small and not usable.  The basilic vein is not adequate.  Deitra Mayo [facility]

## 2022-09-09 DIAGNOSIS — F4323 Adjustment disorder with mixed anxiety and depressed mood: Secondary | ICD-10-CM | POA: Diagnosis not present

## 2022-09-09 DIAGNOSIS — G4730 Sleep apnea, unspecified: Secondary | ICD-10-CM | POA: Diagnosis not present

## 2022-09-09 DIAGNOSIS — J42 Unspecified chronic bronchitis: Secondary | ICD-10-CM | POA: Diagnosis not present

## 2022-09-09 DIAGNOSIS — Z9884 Bariatric surgery status: Secondary | ICD-10-CM | POA: Diagnosis not present

## 2022-09-09 DIAGNOSIS — D631 Anemia in chronic kidney disease: Secondary | ICD-10-CM | POA: Diagnosis not present

## 2022-09-09 DIAGNOSIS — N179 Acute kidney failure, unspecified: Secondary | ICD-10-CM | POA: Diagnosis not present

## 2022-09-09 DIAGNOSIS — Z4789 Encounter for other orthopedic aftercare: Secondary | ICD-10-CM | POA: Diagnosis not present

## 2022-09-09 DIAGNOSIS — E1122 Type 2 diabetes mellitus with diabetic chronic kidney disease: Secondary | ICD-10-CM | POA: Diagnosis not present

## 2022-09-09 DIAGNOSIS — I130 Hypertensive heart and chronic kidney disease with heart failure and stage 1 through stage 4 chronic kidney disease, or unspecified chronic kidney disease: Secondary | ICD-10-CM | POA: Diagnosis not present

## 2022-09-09 DIAGNOSIS — Z6830 Body mass index (BMI) 30.0-30.9, adult: Secondary | ICD-10-CM | POA: Diagnosis not present

## 2022-09-09 DIAGNOSIS — Z8711 Personal history of peptic ulcer disease: Secondary | ICD-10-CM | POA: Diagnosis not present

## 2022-09-09 DIAGNOSIS — I509 Heart failure, unspecified: Secondary | ICD-10-CM | POA: Diagnosis not present

## 2022-09-09 DIAGNOSIS — E875 Hyperkalemia: Secondary | ICD-10-CM | POA: Diagnosis not present

## 2022-09-09 DIAGNOSIS — Z4801 Encounter for change or removal of surgical wound dressing: Secondary | ICD-10-CM | POA: Diagnosis not present

## 2022-09-09 DIAGNOSIS — N184 Chronic kidney disease, stage 4 (severe): Secondary | ICD-10-CM | POA: Diagnosis not present

## 2022-09-12 ENCOUNTER — Inpatient Hospital Stay (HOSPITAL_COMMUNITY): Admission: RE | Admit: 2022-09-12 | Payer: BC Managed Care – PPO | Source: Ambulatory Visit

## 2022-09-15 ENCOUNTER — Other Ambulatory Visit: Payer: Self-pay | Admitting: Family Medicine

## 2022-09-15 DIAGNOSIS — Z131 Encounter for screening for diabetes mellitus: Secondary | ICD-10-CM | POA: Diagnosis not present

## 2022-09-15 DIAGNOSIS — Z23 Encounter for immunization: Secondary | ICD-10-CM | POA: Diagnosis not present

## 2022-09-15 DIAGNOSIS — I160 Hypertensive urgency: Secondary | ICD-10-CM | POA: Diagnosis not present

## 2022-09-15 DIAGNOSIS — E559 Vitamin D deficiency, unspecified: Secondary | ICD-10-CM | POA: Diagnosis not present

## 2022-09-15 DIAGNOSIS — M726 Necrotizing fasciitis: Secondary | ICD-10-CM | POA: Diagnosis not present

## 2022-09-15 DIAGNOSIS — Z1239 Encounter for other screening for malignant neoplasm of breast: Secondary | ICD-10-CM | POA: Diagnosis not present

## 2022-09-15 DIAGNOSIS — F331 Major depressive disorder, recurrent, moderate: Secondary | ICD-10-CM | POA: Diagnosis not present

## 2022-09-15 DIAGNOSIS — D649 Anemia, unspecified: Secondary | ICD-10-CM | POA: Diagnosis not present

## 2022-09-15 DIAGNOSIS — Z136 Encounter for screening for cardiovascular disorders: Secondary | ICD-10-CM | POA: Diagnosis not present

## 2022-09-15 DIAGNOSIS — Z0001 Encounter for general adult medical examination with abnormal findings: Secondary | ICD-10-CM | POA: Diagnosis not present

## 2022-09-15 DIAGNOSIS — R2241 Localized swelling, mass and lump, right lower limb: Secondary | ICD-10-CM | POA: Diagnosis not present

## 2022-09-15 DIAGNOSIS — Z124 Encounter for screening for malignant neoplasm of cervix: Secondary | ICD-10-CM | POA: Diagnosis not present

## 2022-09-15 DIAGNOSIS — I10 Essential (primary) hypertension: Secondary | ICD-10-CM | POA: Diagnosis not present

## 2022-09-15 DIAGNOSIS — R809 Proteinuria, unspecified: Secondary | ICD-10-CM | POA: Diagnosis not present

## 2022-09-26 ENCOUNTER — Encounter (HOSPITAL_COMMUNITY): Payer: BC Managed Care – PPO

## 2022-10-06 DIAGNOSIS — M726 Necrotizing fasciitis: Secondary | ICD-10-CM | POA: Diagnosis not present

## 2022-11-06 DIAGNOSIS — M726 Necrotizing fasciitis: Secondary | ICD-10-CM | POA: Diagnosis not present

## 2022-11-24 ENCOUNTER — Encounter (HOSPITAL_COMMUNITY): Payer: Self-pay

## 2022-12-01 ENCOUNTER — Ambulatory Visit: Payer: BC Managed Care – PPO

## 2022-12-06 DIAGNOSIS — M726 Necrotizing fasciitis: Secondary | ICD-10-CM | POA: Diagnosis not present

## 2023-01-06 DIAGNOSIS — M726 Necrotizing fasciitis: Secondary | ICD-10-CM | POA: Diagnosis not present

## 2023-01-09 ENCOUNTER — Encounter (HOSPITAL_COMMUNITY): Payer: Self-pay

## 2023-01-09 ENCOUNTER — Emergency Department (HOSPITAL_COMMUNITY)
Admission: EM | Admit: 2023-01-09 | Discharge: 2023-01-09 | Discharge status: Against Medical Advice | Payer: BC Managed Care – PPO | Attending: Emergency Medicine | Admitting: Emergency Medicine

## 2023-01-09 DIAGNOSIS — Z5321 Procedure and treatment not carried out due to patient leaving prior to being seen by health care provider: Secondary | ICD-10-CM | POA: Insufficient documentation

## 2023-01-09 DIAGNOSIS — R0781 Pleurodynia: Secondary | ICD-10-CM | POA: Insufficient documentation

## 2023-01-09 DIAGNOSIS — W19XXXA Unspecified fall, initial encounter: Secondary | ICD-10-CM | POA: Insufficient documentation

## 2023-01-09 NOTE — ED Notes (Signed)
Patient states she is going to be seen at another facility. LWBS

## 2023-02-05 DIAGNOSIS — M726 Necrotizing fasciitis: Secondary | ICD-10-CM | POA: Diagnosis not present

## 2023-02-14 DIAGNOSIS — F331 Major depressive disorder, recurrent, moderate: Secondary | ICD-10-CM | POA: Diagnosis not present

## 2023-02-14 DIAGNOSIS — R29898 Other symptoms and signs involving the musculoskeletal system: Secondary | ICD-10-CM | POA: Diagnosis not present

## 2023-02-14 DIAGNOSIS — N184 Chronic kidney disease, stage 4 (severe): Secondary | ICD-10-CM | POA: Diagnosis not present

## 2023-02-14 DIAGNOSIS — F411 Generalized anxiety disorder: Secondary | ICD-10-CM | POA: Diagnosis not present

## 2023-03-08 DIAGNOSIS — M726 Necrotizing fasciitis: Secondary | ICD-10-CM | POA: Diagnosis not present

## 2023-03-21 ENCOUNTER — Other Ambulatory Visit: Payer: Self-pay | Admitting: Family Medicine

## 2023-03-21 DIAGNOSIS — G629 Polyneuropathy, unspecified: Secondary | ICD-10-CM | POA: Diagnosis not present

## 2023-03-21 DIAGNOSIS — G8918 Other acute postprocedural pain: Secondary | ICD-10-CM | POA: Diagnosis not present

## 2023-03-21 DIAGNOSIS — Z124 Encounter for screening for malignant neoplasm of cervix: Secondary | ICD-10-CM | POA: Diagnosis not present

## 2023-03-21 DIAGNOSIS — Z0001 Encounter for general adult medical examination with abnormal findings: Secondary | ICD-10-CM | POA: Diagnosis not present

## 2023-03-21 DIAGNOSIS — I10 Essential (primary) hypertension: Secondary | ICD-10-CM | POA: Diagnosis not present

## 2023-03-21 DIAGNOSIS — Z9884 Bariatric surgery status: Secondary | ICD-10-CM | POA: Diagnosis not present

## 2023-03-21 DIAGNOSIS — Z1231 Encounter for screening mammogram for malignant neoplasm of breast: Secondary | ICD-10-CM

## 2023-03-21 DIAGNOSIS — Z23 Encounter for immunization: Secondary | ICD-10-CM | POA: Diagnosis not present

## 2023-03-21 DIAGNOSIS — F331 Major depressive disorder, recurrent, moderate: Secondary | ICD-10-CM | POA: Diagnosis not present

## 2023-03-27 ENCOUNTER — Ambulatory Visit: Payer: BC Managed Care – PPO

## 2023-04-05 DIAGNOSIS — I120 Hypertensive chronic kidney disease with stage 5 chronic kidney disease or end stage renal disease: Secondary | ICD-10-CM | POA: Diagnosis not present

## 2023-04-05 DIAGNOSIS — R809 Proteinuria, unspecified: Secondary | ICD-10-CM | POA: Diagnosis not present

## 2023-04-05 DIAGNOSIS — N2581 Secondary hyperparathyroidism of renal origin: Secondary | ICD-10-CM | POA: Diagnosis not present

## 2023-04-05 DIAGNOSIS — N189 Chronic kidney disease, unspecified: Secondary | ICD-10-CM | POA: Diagnosis not present

## 2023-04-05 DIAGNOSIS — N179 Acute kidney failure, unspecified: Secondary | ICD-10-CM | POA: Diagnosis not present

## 2023-04-05 DIAGNOSIS — R600 Localized edema: Secondary | ICD-10-CM | POA: Diagnosis not present

## 2023-04-05 DIAGNOSIS — D631 Anemia in chronic kidney disease: Secondary | ICD-10-CM | POA: Diagnosis not present

## 2023-04-05 DIAGNOSIS — N185 Chronic kidney disease, stage 5: Secondary | ICD-10-CM | POA: Diagnosis not present

## 2023-04-05 DIAGNOSIS — E872 Acidosis, unspecified: Secondary | ICD-10-CM | POA: Diagnosis not present

## 2023-04-05 DIAGNOSIS — E1122 Type 2 diabetes mellitus with diabetic chronic kidney disease: Secondary | ICD-10-CM | POA: Diagnosis not present

## 2023-04-08 DIAGNOSIS — M726 Necrotizing fasciitis: Secondary | ICD-10-CM | POA: Diagnosis not present

## 2023-04-13 NOTE — Progress Notes (Addendum)
Patient ID: Jacqueline [name], female   DOB: [birth year], 42 y.o.   MRN: [id number]  Reason for Consult: Leg Pain (Post surgical )   Referred by Harvest Forest, MD  Subjective:     HPI:  Jacqueline [name] is a 42 y.o. female presenting for evaluation of AV access. She was last seen in February of this year but was not an imminent need of dialysis at that time and was recovering from an episode of necrotizing fasciitis. She is right handed and does not have adequate superficial veins in either arm. She has a history HTN, diastolic CHF, and OSA.  Of note she does report some pain in her legs when walking and neuropathy at night but this started after her episode of necrotizing fasciitis and denies true vasculogenic rest pain or nonhealing wounds  Past Medical History:  Diagnosis Date   Anemia    Anxiety    Chronic bronchitis (HCC)    Chronic kidney disease    Hypertension    Increased frequency of headaches    Morbid obesity (HCC)    Necrotizing fasciitis (HCC)    Sleep apnea    Type II diabetes mellitus (HCC)    Family History  Problem Relation Age of Onset   Diabetes Mother    Hypertension Mother    Thyroid disease Mother    Kidney disease Maternal Grandmother    Diabetes Maternal Grandmother    Heart attack Other    Past Surgical History:  Procedure Laterality Date   BARIATRIC SURGERY     BREAST REDUCTION SURGERY  03/21/2017   CARDIAC CATHETERIZATION  01/06/2016   CARDIAC CATHETERIZATION N/A 01/06/2016   Procedure: Left Heart Cath and Coronary Angiography;  Surgeon: Iran Ouch, MD;  Location: [facility];  Service: Cardiovascular;  Laterality: N/A;   CESAREAN SECTION  08/2014   I & D EXTREMITY Right 06/18/2022   Procedure: IRRIGATION AND DEBRIDEMENT RIGHT ABDOMEN AND THIGH;  Surgeon: Kinsinger, De Blanch, MD;  Location: [facility] OR;  Service: General;  Laterality: Right;   I & D EXTREMITY Right 06/20/2022   Procedure: WOUND EXPLORATION OF RIGHT THIGH AND RIGHT GROIN  WITH IRRIGATION AND DEBRIDEMENT;  Surgeon: Griselda Miner, MD;  Location: [facility] OR;  Service: General;  Laterality: Right;   REDUCTION MAMMAPLASTY Bilateral 03/21/2017    Short Social History:  Social History   Tobacco Use   Smoking status: Never   Smokeless tobacco: Never  Substance Use Topics   Alcohol use: No    No Known Allergies  Current Outpatient Medications  Medication Sig Dispense Refill   acetaminophen (TYLENOL) 325 MG tablet Take 1-2 tablets (325-650 mg total) by mouth every 4 (four) hours as needed for mild pain.     amLODipine (NORVASC) 10 MG tablet Take 1 tablet (10 mg total) by mouth daily. 30 tablet 0   calcitRIOL (ROCALTROL) 0.25 MCG capsule Take 1 capsule (0.25 mcg total) by mouth daily. 30 capsule 0   furosemide (LASIX) 40 MG tablet Take 1 tablet (40 mg total) by mouth daily. (Patient taking differently: Take 80 mg by mouth daily.) 30 tablet 0   gabapentin (NEURONTIN) 300 MG capsule Take 1 capsule (300 mg total) by mouth at bedtime. 30 capsule 0   hydrALAZINE (APRESOLINE) 10 MG tablet Take 1 tablet (10 mg total) by mouth daily. 30 tablet 0   oxyCODONE-acetaminophen (PERCOCET) 10-325 MG tablet Take 1 tablet by mouth every 6 (six) hours as needed for pain.     sertraline (ZOLOFT)  100 MG tablet Take 1 tablet (100 mg total) by mouth at bedtime. 30 tablet 0   sodium bicarbonate 650 MG tablet Take 2 tablets (1,300 mg total) by mouth 2 (two) times daily. 120 tablet 0   Vitamin D, Ergocalciferol, (DRISDOL) 1.25 MG (50000 UNIT) CAPS capsule Take 1 capsule (50,000 Units total) by mouth every Thursday. 5 capsule 0   gentamicin cream (GARAMYCIN) 0.1 % Apply 1 Application topically in the morning and at bedtime. Right foot (Patient not taking: Reported on 04/14/2023)     pantoprazole (PROTONIX) 40 MG tablet Take 1 tablet (40 mg total) by mouth at bedtime. (Patient not taking: Reported on 04/14/2023) 30 tablet 0   No current facility-administered medications for this visit.     Review of Systems  All other systems reviewed and are negative       Objective:  Objective   Vitals:   04/14/23 0829  BP: (!) 177/109  Temp: 98 F (36.7 C)  TempSrc: Temporal  SpO2: 100%  Weight: 196 lb 11.2 oz (89.2 kg)  Height: 5\' 5"  (1.651 m)   Body mass index is 32.73 kg/m.  Physical Exam  General: no acute distress Cardiac: hemodynamically stable, nontachycardic Pulm: normal work of breathing GI: non-tender, no pulsatile mass  Neuro: alert, no focal deficit Extremities: 2+ edema bilateral lower extremities from ankles to mid thigh Vascular: Palpable radials brachials and DPs bilaterally  Data: Vein mapping: independently reviewed, no suitable superficial vein for AVF creation Arterial duplex: independently reviewed, adequate diameter and triphasic throughout bilaterally     Assessment/Plan:     Jacqueline [name] is a 42 y/o right-handed female with stage V CKD not currently on HD but anticipated in the near furture.  Vein mapping demonstrates she does not have suitable superficial veins on either upper extremity.  Risks and benefits of AV graft placement were reviewed and she is willing to proceed. We discussed that I will look with the ultrasound after anesthesia is induced to determine if an autologous vein is adequate but we will plan for left upper extremity AV graft creation.     Jacqueline Pastures MD [facility]

## 2023-04-13 NOTE — H&P (View-Only) (Signed)
Patient ID: Jacqueline [name], female   DOB: [birth year], 42 y.o.   MRN: [id number]  Reason for Consult: Leg Pain (Post surgical )   Referred by Harvest Forest, MD  Subjective:     HPI:  Jacqueline [name] is a 42 y.o. female presenting for evaluation of AV access. She was last seen in February of this year but was not an imminent need of dialysis at that time and was recovering from an episode of necrotizing fasciitis. She is right handed and does not have adequate superficial veins in either arm. She has a history HTN, diastolic CHF, and OSA.  Of note she does report some pain in her legs when walking and neuropathy at night but this started after her episode of necrotizing fasciitis and denies true vasculogenic rest pain or nonhealing wounds  Past Medical History:  Diagnosis Date   Anemia    Anxiety    Chronic bronchitis (HCC)    Chronic kidney disease    Hypertension    Increased frequency of headaches    Morbid obesity (HCC)    Necrotizing fasciitis (HCC)    Sleep apnea    Type II diabetes mellitus (HCC)    Family History  Problem Relation Age of Onset   Diabetes Mother    Hypertension Mother    Thyroid disease Mother    Kidney disease Maternal Grandmother    Diabetes Maternal Grandmother    Heart attack Other    Past Surgical History:  Procedure Laterality Date   BARIATRIC SURGERY     BREAST REDUCTION SURGERY  03/21/2017   CARDIAC CATHETERIZATION  01/06/2016   CARDIAC CATHETERIZATION N/A 01/06/2016   Procedure: Left Heart Cath and Coronary Angiography;  Surgeon: Iran Ouch, MD;  Location: [facility];  Service: Cardiovascular;  Laterality: N/A;   CESAREAN SECTION  08/2014   I & D EXTREMITY Right 06/18/2022   Procedure: IRRIGATION AND DEBRIDEMENT RIGHT ABDOMEN AND THIGH;  Surgeon: Kinsinger, De Blanch, MD;  Location: [facility] OR;  Service: General;  Laterality: Right;   I & D EXTREMITY Right 06/20/2022   Procedure: WOUND EXPLORATION OF RIGHT THIGH AND RIGHT GROIN  WITH IRRIGATION AND DEBRIDEMENT;  Surgeon: Griselda Miner, MD;  Location: [facility] OR;  Service: General;  Laterality: Right;   REDUCTION MAMMAPLASTY Bilateral 03/21/2017    Short Social History:  Social History   Tobacco Use   Smoking status: Never   Smokeless tobacco: Never  Substance Use Topics   Alcohol use: No    No Known Allergies  Current Outpatient Medications  Medication Sig Dispense Refill   acetaminophen (TYLENOL) 325 MG tablet Take 1-2 tablets (325-650 mg total) by mouth every 4 (four) hours as needed for mild pain.     amLODipine (NORVASC) 10 MG tablet Take 1 tablet (10 mg total) by mouth daily. 30 tablet 0   calcitRIOL (ROCALTROL) 0.25 MCG capsule Take 1 capsule (0.25 mcg total) by mouth daily. 30 capsule 0   furosemide (LASIX) 40 MG tablet Take 1 tablet (40 mg total) by mouth daily. (Patient taking differently: Take 80 mg by mouth daily.) 30 tablet 0   gabapentin (NEURONTIN) 300 MG capsule Take 1 capsule (300 mg total) by mouth at bedtime. 30 capsule 0   hydrALAZINE (APRESOLINE) 10 MG tablet Take 1 tablet (10 mg total) by mouth daily. 30 tablet 0   oxyCODONE-acetaminophen (PERCOCET) 10-325 MG tablet Take 1 tablet by mouth every 6 (six) hours as needed for pain.     sertraline (ZOLOFT)  100 MG tablet Take 1 tablet (100 mg total) by mouth at bedtime. 30 tablet 0   sodium bicarbonate 650 MG tablet Take 2 tablets (1,300 mg total) by mouth 2 (two) times daily. 120 tablet 0   Vitamin D, Ergocalciferol, (DRISDOL) 1.25 MG (50000 UNIT) CAPS capsule Take 1 capsule (50,000 Units total) by mouth every Thursday. 5 capsule 0   gentamicin cream (GARAMYCIN) 0.1 % Apply 1 Application topically in the morning and at bedtime. Right foot (Patient not taking: Reported on 04/14/2023)     pantoprazole (PROTONIX) 40 MG tablet Take 1 tablet (40 mg total) by mouth at bedtime. (Patient not taking: Reported on 04/14/2023) 30 tablet 0   No current facility-administered medications for this visit.     Review of Systems  All other systems reviewed and are negative       Objective:  Objective   Vitals:   04/14/23 0829  BP: (!) 177/109  Temp: 98 F (36.7 C)  TempSrc: Temporal  SpO2: 100%  Weight: 196 lb 11.2 oz (89.2 kg)  Height: 5\' 5"  (1.651 m)   Body mass index is 32.73 kg/m.  Physical Exam  General: no acute distress Cardiac: hemodynamically stable, nontachycardic Pulm: normal work of breathing GI: non-tender, no pulsatile mass  Neuro: alert, no focal deficit Extremities: 2+ edema bilateral lower extremities from ankles to mid thigh Vascular: Palpable radials brachials and DPs bilaterally  Data: Vein mapping: independently reviewed, no suitable superficial vein for AVF creation Arterial duplex: independently reviewed, adequate diameter and triphasic throughout bilaterally     Assessment/Plan:     Jacqueline [name] is a 42 y/o right-handed female with stage V CKD not currently on HD but anticipated in the near furture.  Vein mapping demonstrates she does not have suitable superficial veins on either upper extremity.  Risks and benefits of AV graft placement were reviewed and she is willing to proceed. We discussed that I will look with the ultrasound after anesthesia is induced to determine if an autologous vein is adequate but we will plan for left upper extremity AV graft creation.     Daria Pastures MD [facility]

## 2023-04-14 ENCOUNTER — Ambulatory Visit (INDEPENDENT_AMBULATORY_CARE_PROVIDER_SITE_OTHER): Payer: BC Managed Care – PPO | Admitting: Vascular Surgery

## 2023-04-14 ENCOUNTER — Encounter: Payer: Self-pay | Admitting: Vascular Surgery

## 2023-04-14 VITALS — BP 177/109 | Temp 98.0°F | Ht 65.0 in | Wt 196.7 lb

## 2023-04-14 DIAGNOSIS — N185 Chronic kidney disease, stage 5: Secondary | ICD-10-CM | POA: Diagnosis not present

## 2023-04-14 DIAGNOSIS — I10 Essential (primary) hypertension: Secondary | ICD-10-CM

## 2023-04-17 ENCOUNTER — Telehealth: Payer: Self-pay

## 2023-04-17 ENCOUNTER — Other Ambulatory Visit: Payer: Self-pay

## 2023-04-17 DIAGNOSIS — N184 Chronic kidney disease, stage 4 (severe): Secondary | ICD-10-CM

## 2023-04-17 DIAGNOSIS — N185 Chronic kidney disease, stage 5: Secondary | ICD-10-CM

## 2023-04-17 NOTE — Telephone Encounter (Signed)
Attempted to reach pt to schedule her surgery. LVM asking that she return our call.

## 2023-04-18 ENCOUNTER — Encounter: Payer: BC Managed Care – PPO | Admitting: Vascular Surgery

## 2023-04-27 ENCOUNTER — Other Ambulatory Visit (HOSPITAL_COMMUNITY): Payer: Self-pay | Admitting: *Deleted

## 2023-04-28 ENCOUNTER — Other Ambulatory Visit: Payer: Self-pay

## 2023-04-28 ENCOUNTER — Encounter (HOSPITAL_COMMUNITY): Payer: Self-pay | Admitting: Vascular Surgery

## 2023-04-28 NOTE — Progress Notes (Signed)
Anesthesia Chart Review: Jacqueline [name]  Case: [id number] Date/Time: 05/02/23 1015   Procedure: LEFT ARM ARTERIOVENOUS (AV) FISTULA CREATION VERSUS GRAFT (Left)   Anesthesia type: Choice   Pre-op diagnosis: Stage V Chronic Kidney Disease   [facility]: [facility] ROOM 16 / [facility]   Surgeons: Daria Pastures, MD       DISCUSSION: Patient is a 42 year old female scheduled for the above procedure. Surgery scheduling postponed from February to allow healing from RLE necrotizing fascitis.  History includes never smoker, HTN, DM2, CKD  (stage V), anemia, OSA, chronic diastolic CHF, obesity (s/p laparoscopic Roux-en-Y gastric bypass 09/03/18), necrotizing fasciitis (right thigh/groin, s/p I&D 06/18/22, 06/20/22; readmission 08/2022 for RLE cellulitis). Normal coronaries in 2017.   BP trends suggest HTN is not optimally controlled, with recent reading mostly ~ 170/90's. By list, she is on amlodipine 10 mg daily, Lasix 80 mg daily, hydralazine 50 mg TID.    Echo on 07/05/19 showed LVEF 60-65%, moderate increased septal wall and LV posterior wall thickness, severe LVH of the basal septum, no regional wall motion abnormalities, grade 2 diastolic dysfunction, normal RV systolic function, no RV wall thickness, mildly dilated LA, no significant valvular abnormalities.    She is a same day work-up, so she will get labs, EKG, and vitals on arrival as indicated. Anesthesia team to evaluate on the day of surgery.    VS:  Wt Readings from Last 3 Encounters:  04/14/23 89.2 kg  09/08/22 85.7 kg  09/01/22 84.3 kg   BP Readings from Last 3 Encounters:  04/14/23 (!) 177/109  01/09/23 (!) 170/97  09/08/22 (!) 171/98   Pulse Readings from Last 3 Encounters:  01/09/23 94  09/08/22 78  09/01/22 85     PROVIDERS: Harvest Forest, MD is PCP [facility]) Crista Elliot, MD is nephrologist - She is not followed routinely by cardiology, but has seen multiple cardiologist with [facility]  intermittently between June 2017 and 06/2019. In 2017 she saw Chilton Si, MD for chest pain at rest and hand normal coronaries by [facility]. She saw Rollene Rotunda, MD November 2018 for atypical chest pain, no ischemic testing recommended. EKG showed ST (not afib). CT suggested pulmonary hypertension or elevated pressures, so he did order an echocardiogram which showed normal main pulmonary artery size. Most recently seen by Weston Brass in 04/2018 and 06/2019. In 2019 she had chest pain with mildly elevated troponin attributed to demand ischemia in setting of hypertensive urgency. In 06/2019, BP control was felt "paramount to controlling her symptoms."   LABS: For day of surgery. Most recent results in Care Everywhere from 09/15/22 include glucose 78, BUN 40, Cr 3.86, AST 63, ALT 45, WBC 6.8, H/H 7.9/24.6, PLT 302, A1c 4.7%.   Sleep Study/CPAP Titration 08/27/17 (pre-gastric bypass): - Date of NPSG, Split Night or HST: 08/03/17  NPSG  AHI 18.7/ hr, desaturation to 75%, body weight 280 lbs IMPRESSIONS - The optimal PAP pressure was 15 cm of water. - Central sleep apnea was not noted during this titration (CAI = 0.0/h). - Severe oxygen desaturations were observed during this titration (min O2 = 69.00%). Minimal saturation at CPAP 15 = 91%. - No snoring was audible during this study. - No cardiac abnormalities were observed during this study. - Clinically significant periodic limb movements were not noted during this study. Arousals associated with PLMs were rare.   IMAGES: 1V PCXR 08/29/22: FINDINGS: The heart size and mediastinal contours are within normal limits. Both lungs are clear.  The visualized skeletal structures are unremarkable. IMPRESSION: No active disease.    EKG: 03/05/2022: Sinus tachycardia at 103 bpm Right axis deviation   CV: Echo 07/05/19: IMPRESSIONS   1. Left ventricular ejection fraction, by visual estimation, is 60 to  65%. The left ventricle has normal  function. Left ventricular septal wall  thickness was moderately increased. Moderately increased left ventricular  posterior wall thickness. There is  severely increased left ventricular hypertrophy of the basal septum.   2. The left ventricle has no regional wall motion abnormalities.   3. Left ventricular diastolic parameters are consistent with Grade II  diastolic dysfunction (pseudonormalization).   4. Elevated left ventricular end-diastolic pressure.   5. Global right ventricle has normal systolic function.The right  ventricular size is normal. No increase in right ventricular wall  thickness.   6. Left atrial size was mildly dilated.   7. Right atrial size was normal.   8. The mitral valve is normal in structure. No evidence of mitral valve  regurgitation. No evidence of mitral stenosis.   9. The tricuspid valve is normal in structure. Tricuspid valve  regurgitation is not demonstrated.  10. The aortic valve is normal in structure. Aortic valve regurgitation is  not visualized. No evidence of aortic valve sclerosis or stenosis.  11. The pulmonic valve was normal in structure. Pulmonic valve  regurgitation is not visualized.  12. The inferior vena cava is normal in size with greater than 50%  respiratory variability, suggesting right atrial pressure of 3 mmHg.    Cardiac cath 01/06/16: 1. Normal coronary arteries. 2. Left ventricular angiography was not performed. EF was normal by echocardiogram recently. 3. Mildly elevated left ventricular end-diastolic pressure with mild gradient across the LVOT.   Past Medical History:  Diagnosis Date   Anemia    Anxiety    Chronic bronchitis (HCC)    Chronic kidney disease    Hypertension    Increased frequency of headaches    Morbid obesity (HCC)    Necrotizing fasciitis (HCC)    Sleep apnea    Type II diabetes mellitus [facility])     Past Surgical History:  Procedure Laterality Date   BARIATRIC SURGERY     BREAST REDUCTION SURGERY   03/21/2017   CARDIAC CATHETERIZATION  01/06/2016   CARDIAC CATHETERIZATION N/A 01/06/2016   Procedure: Left Heart Cath and Coronary Angiography;  Surgeon: Iran Ouch, MD;  [facility]: [facility] INVASIVE CV LAB;  Service: Cardiovascular;  Laterality: N/A;   CESAREAN SECTION  08/2014   I & D EXTREMITY Right 06/18/2022   Procedure: IRRIGATION AND DEBRIDEMENT RIGHT ABDOMEN AND THIGH;  Surgeon: Kinsinger, De Blanch, MD;  Location: [facility] OR;  Service: General;  Laterality: Right;   I & D EXTREMITY Right 06/20/2022   Procedure: WOUND EXPLORATION OF RIGHT THIGH AND RIGHT GROIN WITH IRRIGATION AND DEBRIDEMENT;  Surgeon: Griselda Miner, MD;  Location: [facility] OR;  Service: General;  Laterality: Right;   REDUCTION MAMMAPLASTY Bilateral 03/21/2017    MEDICATIONS: No current facility-administered medications for this encounter.    amLODipine (NORVASC) 10 MG tablet   calcitRIOL (ROCALTROL) 0.25 MCG capsule   ciclopirox (PENLAC) 8 % solution   furosemide (LASIX) 40 MG tablet   gabapentin (NEURONTIN) 300 MG capsule   hydrALAZINE (APRESOLINE) 50 MG tablet   sertraline (ZOLOFT) 100 MG tablet   sodium bicarbonate 650 MG tablet   Vitamin D, Ergocalciferol, (DRISDOL) 1.25 MG (50000 UNIT) CAPS capsule    Shonna Chock, PA-C Surgical Short Stay/Anesthesiology [facility] Phone [phone])  [phone] [facility] Phone [phone] 04/28/2023 1:55 PM

## 2023-04-28 NOTE — Progress Notes (Signed)
PCP - Dr Jamison Oka Cardiologist - none Nephrology - Dr Crista Elliot  Chest x-ray - 08/29/22 EKG - DOS Stress Test - 2016 in [place] - negative ECHO - 07/05/19 Cardiac Cath - 01/06/16  ICD Pacemaker/Loop - n/a  Sleep Study -  Yes 08/2017 (Pre-gastric bypass) CPAP - no CPAP  Diabetes Type 2, no meds, does not check blood sugar.  NPO  Anesthesia review: Yes  STOP now taking any Aspirin (unless otherwise instructed by your surgeon), Aleve, Naproxen, Ibuprofen, Motrin, Advil, Goody's, BC's, all herbal medications, fish oil, and all vitamins.   Coronavirus Screening Do you have any of the following symptoms:  Cough yes/no: No Fever (>100.46F)  yes/no: No Runny nose yes/no: No Sore throat yes/no: No Difficulty breathing/shortness of breath  yes/no: No  Have you traveled in the last 14 days and where? yes/no: No  Patient verbalized understanding of instructions that were given via phone.

## 2023-04-28 NOTE — Anesthesia Preprocedure Evaluation (Addendum)
Anesthesia Evaluation  Patient identified by MRN, date of birth, ID band Patient awake    Reviewed: Allergy & Precautions, NPO status , Patient's Chart, lab work & pertinent test results  Airway Mallampati: II  TM Distance: >3 FB Neck ROM: Full    Dental no notable dental hx.    Pulmonary sleep apnea    Pulmonary exam normal        Cardiovascular hypertension, Pt. on medications +CHF   Rhythm:Regular Rate:Normal     Neuro/Psych  Headaches  Anxiety Depression       GI/Hepatic negative GI ROS, Neg liver ROS,,,  Endo/Other  diabetes, Well Controlled, Type 2    Renal/GU   negative genitourinary   Musculoskeletal negative musculoskeletal ROS (+)    Abdominal Normal abdominal exam  (+)   Peds  Hematology  (+) Blood dyscrasia, anemia Lab Results      Component                Value               Date                      NA                       139                 05/02/2023                K                        4.7                 05/02/2023                CO2                      18 (L)              09/01/2022                GLUCOSE                  88                  05/02/2023                BUN                      63 (H)              05/02/2023                CREATININE               6.20 (H)            05/02/2023                CALCIUM                  7.0 (L)             09/01/2022                GFRNONAA                 13 (L)  09/01/2022              Anesthesia Other Findings   Reproductive/Obstetrics                             Anesthesia Physical Anesthesia Plan  ASA: 3  Anesthesia Plan: MAC and Regional   Post-op Pain Management: Regional block*   Induction: Intravenous  PONV Risk Score and Plan: 2 and Ondansetron, Dexamethasone, Propofol infusion, Treatment may vary due to age or medical condition and Midazolam  Airway Management Planned: Simple Face Mask  and Nasal Cannula  Additional Equipment: None  Intra-op Plan:   Post-operative Plan:   Informed Consent: I have reviewed the patients History and Physical, chart, labs and discussed the procedure including the risks, benefits and alternatives for the proposed anesthesia with the patient or authorized representative who has indicated his/her understanding and acceptance.     Dental advisory given  Plan Discussed with: CRNA  Anesthesia Plan Comments: (PAT note written 04/28/2023 by Shonna Chock, PA-C.  )       Anesthesia Quick Evaluation

## 2023-05-01 ENCOUNTER — Encounter (HOSPITAL_COMMUNITY): Payer: Self-pay

## 2023-05-01 ENCOUNTER — Inpatient Hospital Stay (HOSPITAL_COMMUNITY): Admission: RE | Admit: 2023-05-01 | Payer: BC Managed Care – PPO | Source: Ambulatory Visit

## 2023-05-02 ENCOUNTER — Other Ambulatory Visit: Payer: Self-pay

## 2023-05-02 ENCOUNTER — Other Ambulatory Visit (HOSPITAL_COMMUNITY): Payer: Self-pay

## 2023-05-02 ENCOUNTER — Encounter (HOSPITAL_COMMUNITY): Payer: Self-pay | Admitting: Vascular Surgery

## 2023-05-02 ENCOUNTER — Ambulatory Visit (HOSPITAL_COMMUNITY)
Admission: RE | Admit: 2023-05-02 | Discharge: 2023-05-02 | Disposition: A | Discharge status: Home / Self Care | Payer: BC Managed Care – PPO | Attending: Vascular Surgery | Admitting: Vascular Surgery

## 2023-05-02 ENCOUNTER — Ambulatory Visit (HOSPITAL_COMMUNITY): Payer: BC Managed Care – PPO | Admitting: Vascular Surgery

## 2023-05-02 ENCOUNTER — Encounter (HOSPITAL_COMMUNITY)
Admission: RE | Disposition: A | Discharge status: Home / Self Care | Payer: Self-pay | Source: Home / Self Care | Attending: Vascular Surgery

## 2023-05-02 DIAGNOSIS — N185 Chronic kidney disease, stage 5: Secondary | ICD-10-CM | POA: Diagnosis not present

## 2023-05-02 DIAGNOSIS — Z9884 Bariatric surgery status: Secondary | ICD-10-CM | POA: Insufficient documentation

## 2023-05-02 DIAGNOSIS — I5032 Chronic diastolic (congestive) heart failure: Secondary | ICD-10-CM | POA: Diagnosis not present

## 2023-05-02 DIAGNOSIS — I509 Heart failure, unspecified: Secondary | ICD-10-CM | POA: Diagnosis not present

## 2023-05-02 DIAGNOSIS — E1140 Type 2 diabetes mellitus with diabetic neuropathy, unspecified: Secondary | ICD-10-CM | POA: Diagnosis not present

## 2023-05-02 DIAGNOSIS — Z79899 Other long term (current) drug therapy: Secondary | ICD-10-CM | POA: Insufficient documentation

## 2023-05-02 DIAGNOSIS — I132 Hypertensive heart and chronic kidney disease with heart failure and with stage 5 chronic kidney disease, or end stage renal disease: Secondary | ICD-10-CM | POA: Insufficient documentation

## 2023-05-02 DIAGNOSIS — G4733 Obstructive sleep apnea (adult) (pediatric): Secondary | ICD-10-CM | POA: Insufficient documentation

## 2023-05-02 DIAGNOSIS — E1122 Type 2 diabetes mellitus with diabetic chronic kidney disease: Secondary | ICD-10-CM | POA: Diagnosis not present

## 2023-05-02 HISTORY — PX: AV FISTULA PLACEMENT: SHX1204

## 2023-05-02 LAB — POCT I-STAT, CHEM 8
BUN: 63 mg/dL — ABNORMAL HIGH (ref 6–20)
Calcium, Ion: 0.68 mmol/L — CL (ref 1.15–1.40)
Chloride: 114 mmol/L — ABNORMAL HIGH (ref 98–111)
Creatinine, Ser: 6.2 mg/dL — ABNORMAL HIGH (ref 0.44–1.00)
Glucose, Bld: 88 mg/dL (ref 70–99)
HCT: 25 % — ABNORMAL LOW (ref 36.0–46.0)
Hemoglobin: 8.5 g/dL — ABNORMAL LOW (ref 12.0–15.0)
Potassium: 4.7 mmol/L (ref 3.5–5.1)
Sodium: 139 mmol/L (ref 135–145)
TCO2: 15 mmol/L — ABNORMAL LOW (ref 22–32)

## 2023-05-02 LAB — POCT PREGNANCY, URINE: Preg Test, Ur: NEGATIVE

## 2023-05-02 LAB — GLUCOSE, CAPILLARY
Glucose-Capillary: 88 mg/dL (ref 70–99)
Glucose-Capillary: 86 mg/dL (ref 70–99)

## 2023-05-02 SURGERY — ARTERIOVENOUS (AV) FISTULA CREATION
Anesthesia: Monitor Anesthesia Care | Site: Arm Upper | Laterality: Left

## 2023-05-02 MED ORDER — ORAL CARE MOUTH RINSE: 15.0000 mL | Freq: Once | OROMUCOSAL | Status: AC

## 2023-05-02 MED ORDER — ONDANSETRON HCL 4 MG/2ML IJ SOLN
INTRAMUSCULAR | Status: AC
  Filled 2023-05-02: qty 2

## 2023-05-02 MED ORDER — LIDOCAINE-EPINEPHRINE (PF) 1.5 %-1:200000 IJ SOLN
INTRAMUSCULAR | Status: DC | PRN
Start: 2023-05-02 — End: 2023-05-02
  Administered 2023-05-02: 25 mL via PERINEURAL

## 2023-05-02 MED ORDER — LIDOCAINE HCL 1 % IJ SOLN
INTRAMUSCULAR | Status: DC | PRN
Start: 2023-05-02 — End: 2023-05-02
  Administered 2023-05-02: 6 mL

## 2023-05-02 MED ORDER — SODIUM CHLORIDE 0.9 % IV SOLN: INTRAVENOUS | Status: DC

## 2023-05-02 MED ORDER — PROPOFOL 500 MG/50ML IV EMUL
INTRAVENOUS | Status: DC | PRN
  Administered 2023-05-02: 100 ug/kg/min via INTRAVENOUS
  Administered 2023-05-02 (×2): 25 mg via INTRAVENOUS

## 2023-05-02 MED ORDER — CHLORHEXIDINE GLUCONATE 4 % EX SOLN: 60.0000 mL | Freq: Once | CUTANEOUS | Status: DC

## 2023-05-02 MED ORDER — CHLORHEXIDINE GLUCONATE 0.12 % MT SOLN
15.0000 mL | Freq: Once | OROMUCOSAL | Status: AC
  Administered 2023-05-02: 15 mL via OROMUCOSAL
  Filled 2023-05-02: qty 15

## 2023-05-02 MED ORDER — HEPARIN SODIUM (PORCINE) 1000 UNIT/ML IJ SOLN
INTRAMUSCULAR | Status: DC | PRN
Start: 2023-05-02 — End: 2023-05-02
  Administered 2023-05-02: 3000 [IU] via INTRAVENOUS

## 2023-05-02 MED ORDER — ONDANSETRON HCL 4 MG/2ML IJ SOLN
INTRAMUSCULAR | Status: DC | PRN
  Administered 2023-05-02: 4 mg via INTRAVENOUS

## 2023-05-02 MED ORDER — DEXMEDETOMIDINE HCL IN NACL 80 MCG/20ML IV SOLN
INTRAVENOUS | Status: AC
  Filled 2023-05-02: qty 20

## 2023-05-02 MED ORDER — MIDAZOLAM HCL 2 MG/2ML IJ SOLN: 1.0000 mg | Freq: Once | INTRAMUSCULAR | Status: AC

## 2023-05-02 MED ORDER — PHENYLEPHRINE 80 MCG/ML (10ML) SYRINGE FOR IV PUSH (FOR BLOOD PRESSURE SUPPORT)
PREFILLED_SYRINGE | INTRAVENOUS | Status: AC
  Filled 2023-05-02: qty 10

## 2023-05-02 MED ORDER — MIDAZOLAM HCL 2 MG/2ML IJ SOLN
INTRAMUSCULAR | Status: AC
  Administered 2023-05-02: 1 mg via INTRAVENOUS
  Filled 2023-05-02: qty 2

## 2023-05-02 MED ORDER — 0.9 % SODIUM CHLORIDE (POUR BTL) OPTIME
TOPICAL | Status: DC | PRN
  Administered 2023-05-02: 1000 mL

## 2023-05-02 MED ORDER — HEPARIN 6000 UNIT IRRIGATION SOLUTION
Status: AC
  Filled 2023-05-02: qty 500

## 2023-05-02 MED ORDER — ACETAMINOPHEN 10 MG/ML IV SOLN: 1000.0000 mg | Freq: Once | INTRAVENOUS | Status: DC | PRN

## 2023-05-02 MED ORDER — PROPOFOL 10 MG/ML IV BOLUS
INTRAVENOUS | Status: AC
  Filled 2023-05-02: qty 20

## 2023-05-02 MED ORDER — FENTANYL CITRATE (PF) 100 MCG/2ML IJ SOLN
INTRAMUSCULAR | Status: AC
  Administered 2023-05-02: 50 ug via INTRAVENOUS
  Filled 2023-05-02: qty 2

## 2023-05-02 MED ORDER — DEXMEDETOMIDINE HCL IN NACL 80 MCG/20ML IV SOLN
INTRAVENOUS | Status: DC | PRN
Start: 2023-05-02 — End: 2023-05-02
  Administered 2023-05-02: 8 ug via INTRAVENOUS

## 2023-05-02 MED ORDER — FENTANYL CITRATE (PF) 100 MCG/2ML IJ SOLN: 50.0000 ug | Freq: Once | INTRAMUSCULAR | Status: AC

## 2023-05-02 MED ORDER — OXYCODONE-ACETAMINOPHEN 5-325 MG PO TABS
1.0000 | ORAL_TABLET | Freq: Four times a day (QID) | ORAL | 0 refills | Status: DC | PRN
Start: 2023-05-02 — End: 2023-10-20
  Filled 2023-05-02: qty 20, 5d supply, fill #0

## 2023-05-02 MED ORDER — CEFAZOLIN SODIUM-DEXTROSE 2-4 GM/100ML-% IV SOLN
2.0000 g | INTRAVENOUS | Status: AC
  Administered 2023-05-02: 2 g via INTRAVENOUS
  Filled 2023-05-02: qty 100

## 2023-05-02 MED ORDER — HEPARIN 6000 UNIT IRRIGATION SOLUTION
Status: DC | PRN
  Administered 2023-05-02: 1

## 2023-05-02 MED ORDER — PHENYLEPHRINE 80 MCG/ML (10ML) SYRINGE FOR IV PUSH (FOR BLOOD PRESSURE SUPPORT)
PREFILLED_SYRINGE | INTRAVENOUS | Status: DC | PRN
  Administered 2023-05-02: 160 ug via INTRAVENOUS
  Administered 2023-05-02: 80 ug via INTRAVENOUS
  Administered 2023-05-02 (×6): 160 ug via INTRAVENOUS
  Administered 2023-05-02: 80 ug via INTRAVENOUS

## 2023-05-02 MED ORDER — FENTANYL CITRATE (PF) 100 MCG/2ML IJ SOLN: 25.0000 ug | INTRAMUSCULAR | Status: DC | PRN

## 2023-05-02 SURGICAL SUPPLY — 36 items
ADH SKN CLS APL DERMABOND .7 (GAUZE/BANDAGES/DRESSINGS) ×1
ARMBAND PINK RESTRICT EXTREMIT (MISCELLANEOUS) ×1 IMPLANT
BAG COUNTER SPONGE SURGICOUNT (BAG) ×1 IMPLANT
BAG SPNG CNTER NS LX DISP (BAG) ×1
CANISTER SUCT 3000ML PPV (MISCELLANEOUS) ×1 IMPLANT
CLIP TI MEDIUM 6 (CLIP) ×1 IMPLANT
CLIP TI WIDE RED SMALL 6 (CLIP) ×1 IMPLANT
COVER PROBE W GEL 5X96 (DRAPES) IMPLANT
DERMABOND ADVANCED .7 DNX12 (GAUZE/BANDAGES/DRESSINGS) ×1 IMPLANT
ELECT REM PT RETURN 9FT ADLT (ELECTROSURGICAL) ×1
ELECTRODE REM PT RTRN 9FT ADLT (ELECTROSURGICAL) ×1 IMPLANT
GLOVE BIOGEL PI IND STRL 7.0 (GLOVE) ×1 IMPLANT
GOWN STRL REUS W/ TWL LRG LVL3 (GOWN DISPOSABLE) ×2 IMPLANT
GOWN STRL REUS W/ TWL XL LVL3 (GOWN DISPOSABLE) ×1 IMPLANT
GOWN STRL REUS W/TWL LRG LVL3 (GOWN DISPOSABLE) ×2
GOWN STRL REUS W/TWL XL LVL3 (GOWN DISPOSABLE) ×1
GRAFT GORETEX STRT 4-7X45 (Vascular Products) IMPLANT
INSERT FOGARTY SM (MISCELLANEOUS) IMPLANT
KIT BASIN OR (CUSTOM PROCEDURE TRAY) ×1 IMPLANT
KIT TURNOVER KIT B (KITS) ×1 IMPLANT
LOOP VASCULAR MINI 18 RED (MISCELLANEOUS) ×1
NS IRRIG 1000ML POUR BTL (IV SOLUTION) ×1 IMPLANT
PACK CV ACCESS (CUSTOM PROCEDURE TRAY) ×1 IMPLANT
PAD ARMBOARD 7.5X6 YLW CONV (MISCELLANEOUS) ×2 IMPLANT
POWDER SURGICEL 3.0 GRAM (HEMOSTASIS) IMPLANT
SLING ARM FOAM STRAP LRG (SOFTGOODS) IMPLANT
SLING ARM FOAM STRAP MED (SOFTGOODS) IMPLANT
SUT MNCRL AB 4-0 PS2 18 (SUTURE) ×1 IMPLANT
SUT PROLENE 6 0 BV (SUTURE) ×1 IMPLANT
SUT SILK 1 MH (SUTURE) IMPLANT
SUT VIC AB 3-0 SH 27 (SUTURE) ×2
SUT VIC AB 3-0 SH 27X BRD (SUTURE) ×1 IMPLANT
TOWEL GREEN STERILE (TOWEL DISPOSABLE) ×1 IMPLANT
UNDERPAD 30X36 HEAVY ABSORB (UNDERPADS AND DIAPERS) ×1 IMPLANT
VASCULAR TIE MINI RED 18IN STL (MISCELLANEOUS) IMPLANT
WATER STERILE IRR 1000ML POUR (IV SOLUTION) ×1 IMPLANT

## 2023-05-02 NOTE — Anesthesia Postprocedure Evaluation (Signed)
Anesthesia Post Note  Patient: Jacqueline [name]  Procedure(s) Performed: LEFT ARM ARTERIOVENOUS (AV) FISTULA  GRAFT (Left: Arm Upper)     Patient location during evaluation: PACU Anesthesia Type: Regional and MAC Level of consciousness: awake and alert Pain management: pain level controlled Vital Signs Assessment: post-procedure vital signs reviewed and stable Respiratory status: spontaneous breathing, nonlabored ventilation, respiratory function stable and patient connected to nasal cannula oxygen Cardiovascular status: stable and blood pressure returned to baseline Postop Assessment: no apparent nausea or vomiting Anesthetic complications: no   No notable events documented.  Last Vitals:  Vitals:   05/02/23 1200 05/02/23 1215  BP: 112/67 (!) 158/82  Pulse: 95 99  Resp: 12 (!) 24  Temp:  36.6 C  SpO2: 100% 95%    Last Pain:  Vitals:   05/02/23 1215  PainSc: 0-No pain                 Earl Lites P Daleon Willinger

## 2023-05-02 NOTE — Discharge Instructions (Signed)
[facility]  Discharge Instructions  AV Fistula or Graft Surgery for Dialysis Access  Please refer to the following instructions for your post-procedure care. Your surgeon or physician assistant will discuss any changes with you.  Activity  You may drive the day following your surgery, if you are comfortable and no longer taking prescription pain medication. Resume full activity as the soreness in your incision resolves.  Bathing/Showering  You may shower after you go home. Keep your incision dry for 48 hours. Do not soak in a bathtub, hot tub, or swim until the incision heals completely. You may not shower if you have a hemodialysis catheter.  Incision Care  Clean your incision with mild soap and water after 48 hours. Pat the area dry with a clean towel. You do not need a bandage unless otherwise instructed. Do not apply any ointments or creams to your incision. You may have skin glue on your incision. Do not peel it off. It will come off on its own in about one week. Your arm may swell a bit after surgery. To reduce swelling use pillows to elevate your arm so it is above your heart. Your doctor will tell you if you need to lightly wrap your arm with an ACE bandage.  Diet  Resume your normal diet. There are not special food restrictions following this procedure. In order to heal from your surgery, it is CRITICAL to get adequate nutrition. Your body requires vitamins, minerals, and protein. Vegetables are the best source of vitamins and minerals. Vegetables also provide the perfect balance of protein. Processed food has little nutritional value, so try to avoid this.  Medications  Resume taking all of your medications. If your incision is causing pain, you may take over-the counter pain relievers such as acetaminophen (Tylenol). If you were prescribed a stronger pain medication, please be aware these medications can cause nausea and constipation. Prevent  nausea by taking the medication with a snack or meal. Avoid constipation by drinking plenty of fluids and eating foods with high amount of fiber, such as fruits, vegetables, and grains. Do not take Tylenol if you are taking prescription pain medications.     Follow up Your surgeon may want to see you in the office following your access surgery. If so, this will be arranged at the time of your surgery.  Please call us immediately for any of the following conditions:  Increased pain, redness, drainage (pus) from your incision site Fever of 101 degrees or higher Severe or worsening pain at your incision site Hand pain or numbness.  Reduce your risk of vascular disease:  Stop smoking. If you would like help, call [facility] at 1-800-QUIT-NOW (252-381-6270) or [place] at 765-274-7330  Manage your cholesterol Maintain a desired weight Control your diabetes Keep your blood pressure down  Dialysis  It will take several weeks to several months for your new dialysis access to be ready for use. Your surgeon will determine when it is OK to use it. Your nephrologist will continue to direct your dialysis. You can continue to use your Permcath until your new access is ready for use.  If you have any questions, please call the office at [phone].

## 2023-05-02 NOTE — Anesthesia Procedure Notes (Signed)
Anesthesia Regional Block: Supraclavicular block   Pre-Anesthetic Checklist: , timeout performed,  Correct Patient, Correct Site, Correct Laterality,  Correct Procedure, Correct Position, site marked,  Risks and benefits discussed,  Surgical consent,  Pre-op evaluation,  At surgeon's request and post-op pain management  Laterality: Left  Prep: Dura Prep       Needles:  Injection technique: Single-shot  Needle Type: Echogenic Stimulator Needle     Needle Length: 5cm  Needle Gauge: 20     Additional Needles:   Procedures:,,,, ultrasound used (permanent image in chart),,    Narrative:  Start time: 05/02/2023 9:30 AM End time: 05/02/2023 9:38 AM Injection made incrementally with aspirations every 5 mL.  Performed by: Personally  Anesthesiologist: Atilano Median, DO  Additional Notes: Patient identified. Risks/Benefits/Options discussed with patient including but not limited to bleeding, infection, nerve damage, failed block, incomplete pain control. Patient expressed understanding and wished to proceed. All questions were answered. Sterile technique was used throughout the entire procedure. Please see nursing notes for vital signs. Aspirated in 5cc intervals with injection for negative confirmation. Patient was given instructions on fall risk and not to get out of bed. All questions and concerns addressed with instructions to call with any issues or inadequate analgesia.

## 2023-05-02 NOTE — Transfer of Care (Signed)
Immediate Anesthesia Transfer of Care Note  Patient: Jacqueline [name]  Procedure(s) Performed: LEFT ARM ARTERIOVENOUS (AV) FISTULA  GRAFT (Left: Arm Upper)  Patient Location: PACU  Anesthesia Type:MAC and Regional  Level of Consciousness: drowsy and patient cooperative  Airway & Oxygen Therapy: Patient Spontanous Breathing  Post-op Assessment: Report given to RN and Post -op Vital signs reviewed and stable  Post vital signs: Reviewed and stable  Last Vitals:  Vitals Value Taken Time  BP 112/67 05/02/23 1200  Temp    Pulse 97 05/02/23 1203  Resp 24 05/02/23 1203  SpO2 100 % 05/02/23 1203  Vitals shown include unfiled device data.  Last Pain:  Vitals:   05/02/23 0841  PainSc: 0-No pain         Complications: No notable events documented.

## 2023-05-02 NOTE — Interval H&P Note (Signed)
History and Physical Interval Note:  05/02/2023 9:49 AM  Jacqueline [name]  has presented today for surgery, with the diagnosis of Stage V Chronic Kidney Disease.  The various methods of treatment have been discussed with the patient and family. After consideration of risks, benefits and other options for treatment, the patient has consented to  Procedure(s): LEFT ARM ARTERIOVENOUS (AV) FISTULA CREATION VERSUS GRAFT (Left) as a surgical intervention.  The patient's history has been reviewed, patient examined, no change in status, stable for surgery.  I have reviewed the patient's chart and labs.  Questions were answered to the patient's satisfaction.     Daria Pastures

## 2023-05-02 NOTE — Op Note (Addendum)
NAMEElane [name]    MRN: [id number] DOB: [birth year]    DATE OF OPERATION: 05/02/2023  PREOP DIAGNOSIS:    CKD V  POSTOP DIAGNOSIS:    Same  PROCEDURE:    L arm brachial-axillary AVG  SURGEON: Daria Pastures  ASSIST: Alyssa Grove  ANESTHESIA: MAC and regional block   EBL: 50cc  INDICATIONS:    Jacqueline [name] is a 42 y.o. female with CKD stage V who was seen in the clinic to discuss dialysis access.  She was noted to not have any suitable superficial veins on extremity vein mapping and risk benefits were reviewed of AV graft placement, she expressed understanding is willing to proceed.  FINDINGS:   Palpable thrill over the graft and multiphasic radial and ulnar signals postoperatively  Given the complexity of the case,  the assistant was necessary in order to expedient the procedure and safely perform the technical aspects of the operation.  The assistant provided traction and countertraction to assist with exposure of the artery and vein.  They also assisted with suture ligation of multiple venous branches. They also assisted with tunneling of the graft.  They played a critical role for both anastomoses.. These skills, especially following the Prolene suture for the anastomosis, could not have been adequately performed by a scrub tech assistant.    TECHNIQUE:   The patient was brought to the operating room positioned supine on the operating table after a left upper extremity block was performed in the preop holding area.  Sedation was given under monitored anesthesia care and the left arm was prepped and draped in the usual sterile fashion.  The ultrasound was then sterilely prepped and and the upper extremity vasculature was mapped.  There was no suitable superficial veins for AV fistula creation and the brachial veins at the antecubital fossa were also small.  The basilic vein was noted to enter the deep system early in the upper arm and reason decided to proceed with a  left arm brachial to axillary graft. A timeout was performed and no concerns were expressed. A transverse incision was made about 1 fingerbreadth superior to the elbow crease and dissection was carried deeper with electrocautery.  The brachial sheath was divided and the brachial artery was identified and dissected circumferentially.  Vesseloops were passed proximally and distally for control.  Attention was then turned to the axillary vein.  A longitudinal incision was made in the upper arm and dissection was carried deeper with electrocautery.  The axillary vein was identified and dissected circumferentially for approximately 4 cm.  Branches were controlled with vascular clips.  A 4-7 tapered PTFE graft was brought to the field and subcutaneous tunnel was created using curved a curved tunneler curving anterolaterally between both incisions.  The patient was typically heparinized, the brachial artery was clamped proximally distally and an arteriotomy was created approximately 6 mm in size.  The 4 mm portion of the graft was spatulated and fashioned to the arteriotomy in end-to-side fashion with running 6-0 Prolene.  The clamps were released and the graft was flushed.  The graft was then clamped proximally at the brachial artery and attention was turned to the axillary vein anastomosis.  The axillary vein was clamped proximally distally and a venotomy was created, approximately 10 mm.  The 7 mm portion of the graft was spatulated to match the venotomy and this was fashioned to the axillary vein and end-to-side fashion with a running 6-0 Prolene.  All clamps were released and  a palpable thrill was present in the bypass.  A sterile Doppler was brought onto the field and a multiphasic staining was noted in the radial and ulnar artery and a Doppler thrill present in the axillary vein. Both incisions were closed in layers with 3-0 Vicryl and the skin was closed with 4-0 Monocryl.  Dermabond was applied to the  skin. Patient tolerated the procedure well and was brought to PACU in stable condition. Daria Pastures, MD [facility] DATE OF DICTATION:   05/02/2023

## 2023-05-03 ENCOUNTER — Encounter (HOSPITAL_COMMUNITY): Payer: Self-pay | Admitting: Vascular Surgery

## 2023-05-09 ENCOUNTER — Emergency Department (HOSPITAL_COMMUNITY)
Admission: EM | Admit: 2023-05-09 | Discharge: 2023-05-09 | Disposition: A | Discharge status: Home / Self Care | Payer: BC Managed Care – PPO | Attending: Emergency Medicine | Admitting: Emergency Medicine

## 2023-05-09 ENCOUNTER — Other Ambulatory Visit: Payer: Self-pay

## 2023-05-09 ENCOUNTER — Emergency Department (HOSPITAL_COMMUNITY): Payer: BC Managed Care – PPO

## 2023-05-09 DIAGNOSIS — E1122 Type 2 diabetes mellitus with diabetic chronic kidney disease: Secondary | ICD-10-CM | POA: Insufficient documentation

## 2023-05-09 DIAGNOSIS — I130 Hypertensive heart and chronic kidney disease with heart failure and stage 1 through stage 4 chronic kidney disease, or unspecified chronic kidney disease: Secondary | ICD-10-CM | POA: Diagnosis not present

## 2023-05-09 DIAGNOSIS — I5033 Acute on chronic diastolic (congestive) heart failure: Secondary | ICD-10-CM | POA: Insufficient documentation

## 2023-05-09 DIAGNOSIS — N183 Chronic kidney disease, stage 3 unspecified: Secondary | ICD-10-CM | POA: Insufficient documentation

## 2023-05-09 DIAGNOSIS — R109 Unspecified abdominal pain: Secondary | ICD-10-CM | POA: Insufficient documentation

## 2023-05-09 DIAGNOSIS — R1032 Left lower quadrant pain: Secondary | ICD-10-CM | POA: Diagnosis not present

## 2023-05-09 DIAGNOSIS — Z79899 Other long term (current) drug therapy: Secondary | ICD-10-CM | POA: Diagnosis not present

## 2023-05-09 DIAGNOSIS — D3502 Benign neoplasm of left adrenal gland: Secondary | ICD-10-CM | POA: Diagnosis not present

## 2023-05-09 LAB — BASIC METABOLIC PANEL
Anion gap: 18 — ABNORMAL HIGH (ref 5–15)
BUN: 61 mg/dL — ABNORMAL HIGH (ref 6–20)
CO2: 18 mmol/L — ABNORMAL LOW (ref 22–32)
Calcium: 6.7 mg/dL — ABNORMAL LOW (ref 8.9–10.3)
Chloride: 107 mmol/L (ref 98–111)
Creatinine, Ser: 6.05 mg/dL — ABNORMAL HIGH (ref 0.44–1.00)
GFR, Estimated: 8 mL/min — ABNORMAL LOW (ref >60)
Glucose, Bld: 91 mg/dL (ref 70–99)
Potassium: 4.6 mmol/L (ref 3.5–5.1)
Sodium: 143 mmol/L (ref 135–145)

## 2023-05-09 LAB — HCG, SERUM, QUALITATIVE: Preg, Serum: NEGATIVE

## 2023-05-09 LAB — URINALYSIS, ROUTINE W REFLEX MICROSCOPIC
Bilirubin Urine: NEGATIVE
Glucose, UA: 50 mg/dL — AB
Hgb urine dipstick: NEGATIVE
Ketones, ur: NEGATIVE mg/dL
Nitrite: NEGATIVE
Protein, ur: 100 mg/dL — AB
Specific Gravity, Urine: 1.01 (ref 1.005–1.030)
pH: 7 (ref 5.0–8.0)

## 2023-05-09 LAB — CBC
HCT: 24.3 % — ABNORMAL LOW (ref 36.0–46.0)
Hemoglobin: 7.6 g/dL — ABNORMAL LOW (ref 12.0–15.0)
MCH: 29.3 pg (ref 26.0–34.0)
MCHC: 31.3 g/dL (ref 30.0–36.0)
MCV: 93.8 fL (ref 80.0–100.0)
Platelets: 287 10*3/uL (ref 150–400)
RBC: 2.59 MIL/uL — ABNORMAL LOW (ref 3.87–5.11)
RDW: 14.9 % (ref 11.5–15.5)
WBC: 8 10*3/uL (ref 4.0–10.5)
nRBC: 0 % (ref 0.0–0.2)

## 2023-05-09 MED ORDER — ONDANSETRON 4 MG PO TBDP: 4.0000 mg | ORAL_TABLET | Freq: Three times a day (TID) | ORAL | 0 refills | Status: DC | PRN

## 2023-05-09 MED ORDER — ONDANSETRON HCL 4 MG/2ML IJ SOLN
4.0000 mg | Freq: Once | INTRAMUSCULAR | Status: AC
  Administered 2023-05-09: 4 mg via INTRAVENOUS
  Filled 2023-05-09: qty 2

## 2023-05-09 MED ORDER — CEFDINIR 300 MG PO CAPS: 300.0000 mg | ORAL_CAPSULE | Freq: Every day | ORAL | 0 refills | Status: AC

## 2023-05-09 MED ORDER — HYDROMORPHONE HCL 1 MG/ML IJ SOLN
0.5000 mg | Freq: Once | INTRAMUSCULAR | Status: AC
  Administered 2023-05-09: 0.5 mg via INTRAVENOUS
  Filled 2023-05-09: qty 1

## 2023-05-09 NOTE — Discharge Instructions (Addendum)
We evaluated you for your back pain.  Your CT scan did not show any signs of a kidney stone.  Your urinalysis does have some bacteria and white blood cells so you may have an early kidney infection.  I have prescribed an antibiotic which you should take once daily for 10 days.  Please follow-up with your primary doctor.  I have also prescribed you some nausea medicine which you can take every 8 hours as needed.  If you notice any new or worsening symptoms such as fevers, weakness, lightheadedness, persistent nausea or vomiting, severe pain, chest pain or shortness of breath, or any other new symptoms, please return to the emergency department for reassessment.

## 2023-05-09 NOTE — ED Triage Notes (Signed)
Patient arrives from home for eval of two days of intermittent L flank pain that has now become more severe and constant. Endorses associated emesis, four episodes in the last 24 hours. Patient is taking Percocet d/t recent procedure to place fistula in LUE but even this is not helping the pain. States has increased urge to urinate but when she tries she does not produce much urine.

## 2023-05-09 NOTE — ED Provider Notes (Signed)
[place] EMERGENCY DEPARTMENT AT [facility] Provider Note  CSN: [id number] Arrival date & time: 05/09/23 0744  Chief Complaint(s) Flank Pain and Emesis  HPI Jacqueline [name] is a 42 y.o. female history of obesity, diabetes, CKD presenting with flank pain.  Patient reports around 2 days of left-sided flank pain.  Reports associated nausea and vomiting.  She denies any dysuria but reports urinary frequency, hesitancy, dribbling and incomplete bladder emptying sensations as well.  No fevers or chills.  No abdominal pain.  No rashes.  No chest pain or shortness of breath.  Taking Percocet from recent fistula surgery with some improvement.  No cough.   Past Medical History Past Medical History:  Diagnosis Date   Anemia    Anxiety    Chronic bronchitis (HCC)    Chronic kidney disease    Hypertension    Increased frequency of headaches    Morbid obesity (HCC)    Necrotizing fasciitis (HCC)    Sleep apnea    does not use CPAP - - gastric bypass   Type II diabetes mellitus (HCC)    no meds, diet controlled   Patient Active Problem List   Diagnosis Date Noted   Cellulitis of right leg 08/30/2022   Adjustment disorder with depressed mood 07/01/2022   Debility 06/29/2022   Necrotizing fasciitis (HCC) 06/29/2022   Fasciitis 06/18/2022   Viral illness 08/17/2020   Elevated d-dimer 08/17/2020   Acute lower UTI 08/17/2020   Hypoalbuminemia 08/17/2020   Complicated migraine 12/12/2019   Right hemiplegia (HCC) 12/02/2019   Hypertension    AKI (acute kidney injury) (HCC)    Stage 5 chronic kidney disease not on chronic dialysis (HCC)    Obesity, Class III, BMI 40-49.9 (morbid obesity) (HCC)    Anemia of chronic disease    HLD (hyperlipidemia) 07/02/2018   Acute on chronic diastolic CHF (congestive heart failure) (HCC) 07/02/2018   Depression 07/02/2018   OSA (obstructive sleep apnea) 07/02/2018   Acute on chronic diastolic (congestive) heart failure (HCC) 07/02/2018    Cellulitis of right lower extremity 07/02/2018   Anemia 07/02/2018   Chest pain 05/04/2018   Hypertensive urgency 05/03/2018   Left facial numbness 05/03/2018   Headache 05/03/2018   Elevated troponin 05/03/2018   Hyperlipidemia associated with type 2 diabetes mellitus (HCC) 03/08/2018   Hypertension associated with stage 3 chronic kidney disease due to type 2 diabetes mellitus (HCC) 03/08/2018   Vitamin D deficiency 12/21/2017   Generalized anxiety disorder 08/07/2017   Diabetic peripheral neuropathy (HCC) 06/21/2017   History of asthma 06/21/2017   Steatosis of liver 06/21/2017   Microalbuminuric diabetic nephropathy (HCC) 06/01/2017   Raised TSH level 06/01/2017   Thrombocytosis 06/01/2017   Morbid obesity with BMI of 40.0-44.9, adult (HCC) 05/30/2017   Allergic rhinitis 05/30/2017   PAF (paroxysmal atrial fibrillation) (HCC) 05/25/2017   Sepsis (HCC) 05/03/2017    Class: Present on Admission   Acute renal failure superimposed on stage 3 chronic kidney disease (HCC) 05/03/2017    Class: Stage 3   Neck pain    Acidosis, metabolic    Hyponatremia    Normocytic anemia 04/30/2017   Staphylococcus aureus bacteremia 03/15/2016   Streptococcal bacteremia 03/15/2016   Tachycardia 03/14/2016   Fever 03/14/2016   Bad headache 03/14/2016   Morbid obesity (HCC)    Diabetes (HCC) 01/01/2014   Home Medication(s) Prior to Admission medications   Medication Sig Start Date End Date Taking? Authorizing Provider  cefdinir (OMNICEF) 300 MG capsule Take 1 capsule (  300 mg total) by mouth daily for 10 days. 05/09/23 05/19/23 Yes Lonell Grandchild, MD  ondansetron (ZOFRAN-ODT) 4 MG disintegrating tablet Take 1 tablet (4 mg total) by mouth every 8 (eight) hours as needed for nausea or vomiting. 05/09/23  Yes Lonell Grandchild, MD  amLODipine (NORVASC) 10 MG tablet Take 1 tablet (10 mg total) by mouth daily. 07/07/22   Angiulli, Mcarthur Rossetti, PA-C  calcitRIOL (ROCALTROL) 0.25 MCG capsule Take 1  capsule (0.25 mcg total) by mouth daily. 07/07/22   Angiulli, Mcarthur Rossetti, PA-C  ciclopirox (PENLAC) 8 % solution Apply 1 Application topically at bedtime. Apply over nail and surrounding skin. Apply daily over previous coat. After seven (7) days, may remove with alcohol and continue cycle.    [provider]  furosemide (LASIX) 40 MG tablet Take 1 tablet (40 mg total) by mouth daily. Patient taking differently: Take 80 mg by mouth daily. 07/07/22   Angiulli, Mcarthur Rossetti, PA-C  gabapentin (NEURONTIN) 300 MG capsule Take 1 capsule (300 mg total) by mouth at bedtime. 07/07/22   Angiulli, Mcarthur Rossetti, PA-C  hydrALAZINE (APRESOLINE) 50 MG tablet Take 50 mg by mouth 3 (three) times daily.    [provider]  oxyCODONE-acetaminophen (PERCOCET/ROXICET) 5-325 MG tablet Take 1 tablet by mouth every 6 (six) hours as needed. 05/02/23   Lars Mage, PA-C  sertraline (ZOLOFT) 100 MG tablet Take 1 tablet (100 mg total) by mouth at bedtime. 07/07/22   Angiulli, Mcarthur Rossetti, PA-C  sodium bicarbonate 650 MG tablet Take 2 tablets (1,300 mg total) by mouth 2 (two) times daily. Patient taking differently: Take 1,300 mg by mouth daily. 07/07/22   Angiulli, Mcarthur Rossetti, PA-C  Vitamin D, Ergocalciferol, (DRISDOL) 1.25 MG (50000 UNIT) CAPS capsule Take 1 capsule (50,000 Units total) by mouth every Thursday. 07/07/22   Angiulli, Mcarthur Rossetti, PA-C                                                                                                                                    Past Surgical History Past Surgical History:  Procedure Laterality Date   AV FISTULA PLACEMENT Left 05/02/2023   Procedure: LEFT ARM ARTERIOVENOUS (AV) FISTULA  GRAFT;  Surgeon: Daria Pastures, MD;  Location: [facility] OR;  Service: Vascular;  Laterality: Left;   BARIATRIC SURGERY     BREAST REDUCTION SURGERY  03/21/2017   CARDIAC CATHETERIZATION  01/06/2016   CARDIAC CATHETERIZATION N/A 01/06/2016   Procedure: Left Heart Cath and Coronary  Angiography;  Surgeon: Iran Ouch, MD;  Location: [facility] INVASIVE CV LAB;  Service: Cardiovascular;  Laterality: N/A;   CESAREAN SECTION  08/2014   I & D EXTREMITY Right 06/18/2022   Procedure: IRRIGATION AND DEBRIDEMENT RIGHT ABDOMEN AND THIGH;  Surgeon: Kinsinger, De Blanch, MD;  Location: [facility] OR;  Service: General;  Laterality: Right;   I & D EXTREMITY Right 06/20/2022   Procedure: WOUND EXPLORATION OF RIGHT THIGH  AND RIGHT GROIN WITH IRRIGATION AND DEBRIDEMENT;  Surgeon: Griselda Miner, MD;  Location: [facility] OR;  Service: General;  Laterality: Right;   REDUCTION MAMMAPLASTY Bilateral 03/21/2017   Family History Family History  Problem Relation Age of Onset   Diabetes Mother    Hypertension Mother    Thyroid disease Mother    Kidney disease Maternal Grandmother    Diabetes Maternal Grandmother    Heart attack Other     Social History Social History   Tobacco Use   Smoking status: Never   Smokeless tobacco: Never  Vaping Use   Vaping status: Never Used  Substance Use Topics   Alcohol use: No   Drug use: No   Allergies Patient has no known allergies.  Review of Systems Review of Systems  All other systems reviewed and are negative.   Physical Exam Vital Signs  I have reviewed the triage vital signs BP (!) 160/85   Pulse 88   Temp 98.7 F (37.1 C) (Oral)   Resp 16   Ht 5\' 5"  (1.651 m)   Wt 81.6 kg   LMP 04/18/2023 (Exact Date)   SpO2 96%   BMI 29.95 kg/m  Physical Exam Vitals and nursing note reviewed.  Constitutional:      General: She is not in acute distress.    Appearance: She is well-developed.  HENT:     Head: Normocephalic and atraumatic.     Mouth/Throat:     Mouth: Mucous membranes are moist.  Eyes:     Pupils: Pupils are equal, round, and reactive to light.  Cardiovascular:     Rate and Rhythm: Normal rate and regular rhythm.     Heart sounds: No murmur heard. Pulmonary:     Effort: Pulmonary effort is normal. No respiratory distress.      Breath sounds: Normal breath sounds.  Abdominal:     General: Abdomen is flat.     Palpations: Abdomen is soft.     Tenderness: There is no abdominal tenderness. There is left CVA tenderness.  Musculoskeletal:        General: No tenderness.     Right lower leg: No edema.     Left lower leg: No edema.     Comments: Left upper extremity fistula with palpable thrill  Skin:    General: Skin is warm and dry.  Neurological:     General: No focal deficit present.     Mental Status: She is alert. Mental status is at baseline.  Psychiatric:        Mood and Affect: Mood normal.        Behavior: Behavior normal.     ED Results and Treatments Labs (all labs ordered are listed, but only abnormal results are displayed) Labs Reviewed  URINALYSIS, ROUTINE W REFLEX MICROSCOPIC - Abnormal; Notable for the following components:      Result Value   APPearance HAZY (*)    Glucose, UA 50 (*)    Protein, ur 100 (*)    Leukocytes,Ua TRACE (*)    Bacteria, UA RARE (*)    All other components within normal limits  BASIC METABOLIC PANEL - Abnormal; Notable for the following components:   CO2 18 (*)    BUN 61 (*)    Creatinine, Ser 6.05 (*)    Calcium 6.7 (*)    GFR, Estimated 8 (*)    Anion gap 18 (*)    All other components within normal limits  CBC - Abnormal; Notable for the  following components:   RBC 2.59 (*)    Hemoglobin 7.6 (*)    HCT 24.3 (*)    All other components within normal limits  URINE CULTURE  HCG, SERUM, QUALITATIVE                                                                                                                          Radiology CT Renal Stone Study  Result Date: 05/09/2023 CLINICAL DATA:  Abdominal pain and left flank pain with nausea and vomiting. EXAM: CT ABDOMEN AND PELVIS WITHOUT CONTRAST TECHNIQUE: Multidetector CT imaging of the abdomen and pelvis was performed following the standard protocol without IV contrast. RADIATION DOSE REDUCTION: This exam  was performed according to the departmental dose-optimization program which includes automated exposure control, adjustment of the mA and/or kV according to patient size and/or use of iterative reconstruction technique. COMPARISON:  06/18/2022 FINDINGS: Lower chest: No acute abnormality. Hepatobiliary: No focal liver abnormality is seen. No gallstones, gallbladder wall thickening, or biliary dilatation. Pancreas: Unremarkable. No pancreatic ductal dilatation or surrounding inflammatory changes. Spleen: Normal in size without focal abnormality. Adrenals/Urinary Tract: Stable roughly 2.3 cm low-density left adrenal lesion consistent with benign adenoma. Both kidneys demonstrate stable mild atrophy. No hydronephrosis. No renal or ureteral calculi identified. The bladder is unremarkable. Stomach/Bowel: Stable appearance after prior gastric bypass bowel shows no evidence of obstruction, ileus, inflammation or lesion. The appendix is not discretely visualized. No free intraperitoneal air. Vascular/Lymphatic: Vascular calcifications likely related to underlying diabetes. No lymphadenopathy identified. Reproductive: Uterus and bilateral adnexa are unremarkable. Other: Mild diffuse body wall edema. Musculoskeletal: No acute or significant osseous findings. IMPRESSION: 1. No acute findings in the abdomen or pelvis. 2. Stable left adrenal adenoma. 3. Stable appearance after prior gastric bypass. 4. Mild diffuse body wall edema. Electronically Signed   By: Irish Lack M.D.   On: 05/09/2023 13:15    Pertinent labs & imaging results that were available during my care of the patient were reviewed by me and considered in my medical decision making (see MDM for details).  Medications Ordered in ED Medications  ondansetron (ZOFRAN) injection 4 mg (4 mg Intravenous Given 05/09/23 1043)  HYDROmorphone (DILAUDID) injection 0.5 mg (0.5 mg Intravenous Given 05/09/23 1044)  Procedures Procedures  (including critical care time)  Medical Decision Making / ED Course   MDM:  42 year old presenting to the emergency department with flank pain.  On exam, patient has left-sided CVA tenderness, otherwise well-appearing, abdominal exam reassuring.  Suspect symptoms possibly due to mild pyelonephritis.  She does have some bacteria and leukocytes in her urine.  She is also having some urinary symptoms consistent with urinary infection.  Will send urine culture.  Will treat with cefdinir.  CT scan obtained to evaluate for alternative cause such as nephrolithiasis, intra-abdominal infection, bowel obstruction, perforation, abscess and negative for any other acute process.  Differential also includes muscle spasm but given urinary symptoms we will treat for urinary infection.  Labs consistent with diagnosis of end-stage renal disease.  Patient not yet on dialysis and still makes urine. Will discharge patient to home. All questions answered. Patient comfortable with plan of discharge. Return precautions discussed with patient and specified on the after visit summary.       Additional history obtained:  -External records from outside source obtained and reviewed including: Chart review including previous notes, labs, imaging, consultation notes including prior admission for necrotizing infection   Lab Tests: -I ordered, reviewed, and interpreted labs.   The pertinent results include:   Labs Reviewed  URINALYSIS, ROUTINE W REFLEX MICROSCOPIC - Abnormal; Notable for the following components:      Result Value   APPearance HAZY (*)    Glucose, UA 50 (*)    Protein, ur 100 (*)    Leukocytes,Ua TRACE (*)    Bacteria, UA RARE (*)    All other components within normal limits  BASIC METABOLIC PANEL - Abnormal; Notable for the following components:   CO2 18 (*)    BUN 61 (*)    Creatinine, Ser  6.05 (*)    Calcium 6.7 (*)    GFR, Estimated 8 (*)    Anion gap 18 (*)    All other components within normal limits  CBC - Abnormal; Notable for the following components:   RBC 2.59 (*)    Hemoglobin 7.6 (*)    HCT 24.3 (*)    All other components within normal limits  URINE CULTURE  HCG, SERUM, QUALITATIVE    Notable for signs of UTI, ESRD   Imaging Studies ordered: I ordered imaging studies including CT abd/pelvis wo  On my interpretation imaging demonstrates no acute process I independently visualized and interpreted imaging. I agree with the radiologist interpretation   Medicines ordered and prescription drug management: Meds ordered this encounter  Medications   ondansetron (ZOFRAN) injection 4 mg   HYDROmorphone (DILAUDID) injection 0.5 mg   ondansetron (ZOFRAN-ODT) 4 MG disintegrating tablet    Sig: Take 1 tablet (4 mg total) by mouth every 8 (eight) hours as needed for nausea or vomiting.    Dispense:  12 tablet    Refill:  0   cefdinir (OMNICEF) 300 MG capsule    Sig: Take 1 capsule (300 mg total) by mouth daily for 10 days.    Dispense:  10 capsule    Refill:  0    -I have reviewed the patients home medicines and have made adjustments as needed   Social Determinants of Health:  Diagnosis or treatment significantly limited by social determinants of health: obesity   Reevaluation: After the interventions noted above, I reevaluated the patient and found that their symptoms have improved  Co morbidities that complicate the patient evaluation  Past Medical History:  Diagnosis  Date   Anemia    Anxiety    Chronic bronchitis (HCC)    Chronic kidney disease    Hypertension    Increased frequency of headaches    Morbid obesity (HCC)    Necrotizing fasciitis (HCC)    Sleep apnea    does not use CPAP - - gastric bypass   Type II diabetes mellitus (HCC)    no meds, diet controlled      Dispostion: Disposition decision including need for hospitalization  was considered, and patient discharged from emergency department.    Final Clinical Impression(s) / ED Diagnoses Final diagnoses:  Left flank pain     This chart was dictated using voice recognition software.  Despite best efforts to proofread,  errors can occur which can change the documentation meaning.    Lonell Grandchild, MD 05/09/23 [id number]

## 2023-05-10 ENCOUNTER — Telehealth: Payer: Self-pay

## 2023-05-10 ENCOUNTER — Ambulatory Visit (INDEPENDENT_AMBULATORY_CARE_PROVIDER_SITE_OTHER): Payer: BC Managed Care – PPO | Admitting: Physician Assistant

## 2023-05-10 VITALS — BP 165/88 | HR 101 | Temp 97.3°F | Ht 65.0 in | Wt 181.8 lb

## 2023-05-10 DIAGNOSIS — N185 Chronic kidney disease, stage 5: Secondary | ICD-10-CM

## 2023-05-10 NOTE — Telephone Encounter (Signed)
Caller: Patient  Concern: Intermittent numbness of fingers that has become constant since this morning, tips of fingers cold and turning blue. She denies pain, but is unable to pick up and hold items in her hand.  Location: left arm  Description:  yesterday and today  Procedure: Dialysis Access Surgery, AVG on 10/15  Consulted: McKenzi, PA  Resolution: Appointment scheduled for first available triage  Next Appt: Appointment scheduled for 10/23 @ 1300

## 2023-05-10 NOTE — Progress Notes (Signed)
POST OPERATIVE OFFICE NOTE    CC:  F/u for surgery  HPI:  Jacqueline [name] is a 42 y.o. female who is s/p left brachial axillary AV graft creation on 05/02/2023 by Dr. Hetty Blend.  This was done for dialysis access in the setting of CKD stage V.  She is not on dialysis yet.  She returns today as a triage visit.  After surgery she did not have any issues with steal symptoms.  She states yesterday she started having intermittent numbness of her left 2nd through 4th fingers.  This morning she noticed for a couple of minutes the same fingers turning blue at the tip.  At the same time they were numb and felt very cold.  This went away naturally and has not happened since this morning, but the numbness continues to wax and wane.  She denies any weakness of the hand or pain.   No Known Allergies  Current Outpatient Medications  Medication Sig Dispense Refill   amLODipine (NORVASC) 10 MG tablet Take 1 tablet (10 mg total) by mouth daily. 30 tablet 0   calcitRIOL (ROCALTROL) 0.25 MCG capsule Take 1 capsule (0.25 mcg total) by mouth daily. 30 capsule 0   cefdinir (OMNICEF) 300 MG capsule Take 1 capsule (300 mg total) by mouth daily for 10 days. 10 capsule 0   ciclopirox (PENLAC) 8 % solution Apply 1 Application topically at bedtime. Apply over nail and surrounding skin. Apply daily over previous coat. After seven (7) days, may remove with alcohol and continue cycle.     furosemide (LASIX) 40 MG tablet Take 1 tablet (40 mg total) by mouth daily. (Patient taking differently: Take 80 mg by mouth daily.) 30 tablet 0   gabapentin (NEURONTIN) 300 MG capsule Take 1 capsule (300 mg total) by mouth at bedtime. 30 capsule 0   hydrALAZINE (APRESOLINE) 50 MG tablet Take 50 mg by mouth 3 (three) times daily.     ondansetron (ZOFRAN-ODT) 4 MG disintegrating tablet Take 1 tablet (4 mg total) by mouth every 8 (eight) hours as needed for nausea or vomiting. 12 tablet 0   oxyCODONE-acetaminophen (PERCOCET/ROXICET) 5-325 MG  tablet Take 1 tablet by mouth every 6 (six) hours as needed. 20 tablet 0   sertraline (ZOLOFT) 100 MG tablet Take 1 tablet (100 mg total) by mouth at bedtime. 30 tablet 0   sodium bicarbonate 650 MG tablet Take 2 tablets (1,300 mg total) by mouth 2 (two) times daily. (Patient taking differently: Take 1,300 mg by mouth daily.) 120 tablet 0   Vitamin D, Ergocalciferol, (DRISDOL) 1.25 MG (50000 UNIT) CAPS capsule Take 1 capsule (50,000 Units total) by mouth every Thursday. 5 capsule 0   No current facility-administered medications for this visit.     ROS:  See HPI  Physical Exam:  Incision: Left upper extremity incisions well-appearing, with mild swelling.  No underlying erythema Extremities: Nonpalpable left radial pulse.  Triphasic left palmar, radial, and ulnar signals.  Signals did not augment much with graft compression.  Left upper arm AV graft with great thrill on palpation Neuro: Current LUE grip strength and sensation 5/5    Assessment/Plan:  This is a 42 y.o. female who is here as a triage visit  -The patient called our triage line this morning with concerning symptoms for steal. She just recently had a left brachial axillary AV graft placed 8 days ago by Dr. Hetty Blend.  This is her first dialysis access. -She did not have any issues with steal symptoms immediately after surgery.  Yesterday she started intermittently having issues with left 2nd through 4th fingertip numbness.  This numbness has continued to wax and wane into this morning.  This morning she had a brief period of left 2nd through 4th fingertip coldness and color change.  She states these fingertips turn blue for a couple of minutes. -She denies any issues with left hand weakness or pain.  Upon my exam this afternoon, her left hand is warm and she has no sensorimotor deficits.  -She has a nonpalpable left radial pulse.  She has multiphasic left palmar, radial, and ulnar signals.  This is unchanged from her perioperative  exam. -I explained to the patient potentially she has early symptoms of steal.  At this time her symptoms do not warrant immediate graft ligation/excision.  I have given her a squeeze ball to exercise her hand.  I have explained that she should call our office if her left hand numbness does not go away or if she begins to have constant left hand weakness and/or pain.  This would warrant emergent graft ligation/excision.  At this time she can keep her follow-up with our office on November 15   Loel Dubonnet, [facility] [phone]   Clinic MD:  Randie Heinz

## 2023-05-11 LAB — URINE CULTURE: Culture: NO GROWTH

## 2023-05-15 ENCOUNTER — Encounter (HOSPITAL_COMMUNITY): Payer: BC Managed Care – PPO

## 2023-06-02 ENCOUNTER — Other Ambulatory Visit (HOSPITAL_COMMUNITY): Payer: Self-pay | Admitting: Physician Assistant

## 2023-06-02 ENCOUNTER — Ambulatory Visit (INDEPENDENT_AMBULATORY_CARE_PROVIDER_SITE_OTHER): Payer: BC Managed Care – PPO | Admitting: Physician Assistant

## 2023-06-02 ENCOUNTER — Ambulatory Visit (HOSPITAL_COMMUNITY)
Admission: RE | Admit: 2023-06-02 | Discharge: 2023-06-02 | Disposition: A | Discharge status: Home / Self Care | Payer: BC Managed Care – PPO | Source: Ambulatory Visit | Attending: Vascular Surgery

## 2023-06-02 VITALS — BP 192/95 | HR 72 | Temp 98.4°F | Ht 65.0 in | Wt 186.0 lb

## 2023-06-02 DIAGNOSIS — Z4889 Encounter for other specified surgical aftercare: Secondary | ICD-10-CM

## 2023-06-02 DIAGNOSIS — N185 Chronic kidney disease, stage 5: Secondary | ICD-10-CM

## 2023-06-02 DIAGNOSIS — N2581 Secondary hyperparathyroidism of renal origin: Secondary | ICD-10-CM | POA: Diagnosis not present

## 2023-06-02 DIAGNOSIS — D631 Anemia in chronic kidney disease: Secondary | ICD-10-CM | POA: Diagnosis not present

## 2023-06-02 DIAGNOSIS — N189 Chronic kidney disease, unspecified: Secondary | ICD-10-CM | POA: Diagnosis not present

## 2023-06-02 DIAGNOSIS — I120 Hypertensive chronic kidney disease with stage 5 chronic kidney disease or end stage renal disease: Secondary | ICD-10-CM | POA: Diagnosis not present

## 2023-06-02 DIAGNOSIS — E1122 Type 2 diabetes mellitus with diabetic chronic kidney disease: Secondary | ICD-10-CM | POA: Diagnosis not present

## 2023-06-02 NOTE — Progress Notes (Signed)
POST OPERATIVE OFFICE NOTE    CC:  F/u for surgery  HPI:  This is a 42 y.o. female who is s/p eft arm brachial to axillary graft  on 05/02/23 by Dr. Hetty Blend.    Pt returns today for follow up.  Pt states  she is not on HD.  She denies rest pain, loss of sensation or motor in the left UE.  She does have an area of fullness at the distal anastomosis.   No Known Allergies  Current Outpatient Medications  Medication Sig Dispense Refill   amLODipine (NORVASC) 10 MG tablet Take 1 tablet (10 mg total) by mouth daily. 30 tablet 0   calcitRIOL (ROCALTROL) 0.25 MCG capsule Take 1 capsule (0.25 mcg total) by mouth daily. 30 capsule 0   ciclopirox (PENLAC) 8 % solution Apply 1 Application topically at bedtime. Apply over nail and surrounding skin. Apply daily over previous coat. After seven (7) days, may remove with alcohol and continue cycle.     furosemide (LASIX) 40 MG tablet Take 1 tablet (40 mg total) by mouth daily. (Patient taking differently: Take 80 mg by mouth daily.) 30 tablet 0   gabapentin (NEURONTIN) 300 MG capsule Take 1 capsule (300 mg total) by mouth at bedtime. 30 capsule 0   hydrALAZINE (APRESOLINE) 50 MG tablet Take 50 mg by mouth 3 (three) times daily.     ondansetron (ZOFRAN-ODT) 4 MG disintegrating tablet Take 1 tablet (4 mg total) by mouth every 8 (eight) hours as needed for nausea or vomiting. 12 tablet 0   oxyCODONE-acetaminophen (PERCOCET/ROXICET) 5-325 MG tablet Take 1 tablet by mouth every 6 (six) hours as needed. 20 tablet 0   sertraline (ZOLOFT) 100 MG tablet Take 1 tablet (100 mg total) by mouth at bedtime. 30 tablet 0   sodium bicarbonate 650 MG tablet Take 2 tablets (1,300 mg total) by mouth 2 (two) times daily. (Patient taking differently: Take 1,300 mg by mouth daily.) 120 tablet 0   Vitamin D, Ergocalciferol, (DRISDOL) 1.25 MG (50000 UNIT) CAPS capsule Take 1 capsule (50,000 Units total) by mouth every Thursday. 5 capsule 0   No current facility-administered  medications for this visit.     ROS:  See HPI  Physical Exam:  Duplex was performed today and show hematoma without active bleeding.  The area is soft.  +--------------------+----------+-----------------+--------+  AVG                PSV (cm/s)Flow Vol (mL/min)Describe  +--------------------+----------+-----------------+--------+  Native artery inflow   322          1980                 +--------------------+----------+-----------------+--------+  Arterial anastomosis   390                               +--------------------+----------+-----------------+--------+  Prox graft             678                               +--------------------+----------+-----------------+--------+  Mid graft              188                               +--------------------+----------+-----------------+--------+  Distal graft  198                               +--------------------+----------+-----------------+--------+  Venous anastomosis     730                               +--------------------+----------+-----------------+--------+  Venous outflow         296                               +--------------------+----------+-----------------+--------+      Summary:  Patent AVG.  Flow volume 1980 ml/min.  Elevated velocities with no hemodynamically significant stenosis.  Large hematoma at the antecubal space.  Perigraft fluid in the distal upper arm.      Incision:  well healed, full ness without skin damage distal incison  Extremities:  Palpable radial pulse, palpable thrill in graft Neuro: sensation and motor intact     Assessment/Plan:  This is a 42 y.o. female who is s/p:Left UE AV graft placement by Dr. Hetty Blend.  She is not on HD.  Her skin is healthy appearing without ischemia.  The hematoma will dissipate with time.  Elevation and active use is advised.  F/U in 3-4 weeks if she has concerns.  The graft may be accessed four weeks  after surgery if needed.      [facility] [phone]   MD on call Randie Heinz

## 2023-06-23 ENCOUNTER — Ambulatory Visit (HOSPITAL_COMMUNITY)
Admission: RE | Admit: 2023-06-23 | Discharge: 2023-06-23 | Disposition: A | Discharge status: Home / Self Care | Payer: BC Managed Care – PPO | Source: Ambulatory Visit | Attending: Nephrology | Admitting: Nephrology

## 2023-06-23 VITALS — BP 175/86 | HR 96 | Temp 98.6°F | Resp 17 | Wt 174.0 lb

## 2023-06-23 DIAGNOSIS — N183 Chronic kidney disease, stage 3 unspecified: Secondary | ICD-10-CM | POA: Insufficient documentation

## 2023-06-23 DIAGNOSIS — N185 Chronic kidney disease, stage 5: Secondary | ICD-10-CM | POA: Insufficient documentation

## 2023-06-23 DIAGNOSIS — N170 Acute kidney failure with tubular necrosis: Secondary | ICD-10-CM | POA: Insufficient documentation

## 2023-06-23 LAB — POCT HEMOGLOBIN-HEMACUE: Hemoglobin: 7.7 g/dL — ABNORMAL LOW (ref 12.0–15.0)

## 2023-06-23 MED ORDER — EPOETIN ALFA-EPBX 10000 UNIT/ML IJ SOLN
INTRAMUSCULAR | Status: AC
  Filled 2023-06-23: qty 2

## 2023-06-23 MED ORDER — SODIUM CHLORIDE 0.9 % IV SOLN
510.0000 mg | INTRAVENOUS | Status: DC
  Administered 2023-06-23: 510 mg via INTRAVENOUS
  Filled 2023-06-23: qty 510

## 2023-06-23 MED ORDER — EPOETIN ALFA-EPBX 10000 UNIT/ML IJ SOLN
15000.0000 [IU] | INTRAMUSCULAR | Status: DC
  Administered 2023-06-23: 15000 [IU] via SUBCUTANEOUS

## 2023-06-23 NOTE — Progress Notes (Signed)
Pt here for retacrit and feraheme.  HGB 7.7 via hemocue.  Pt denies any symptoms.  O=Dr Bhandari's office notified and instructed to proceed with retacrit and feraheme as ordered

## 2023-07-07 ENCOUNTER — Encounter (HOSPITAL_COMMUNITY)
Admission: RE | Admit: 2023-07-07 | Discharge: 2023-07-07 | Disposition: A | Discharge status: Home / Self Care | Payer: BC Managed Care – PPO | Source: Ambulatory Visit | Attending: Nephrology | Admitting: Nephrology

## 2023-07-07 VITALS — BP 169/92 | HR 94 | Temp 98.1°F | Resp 17 | Wt 174.0 lb

## 2023-07-07 DIAGNOSIS — N170 Acute kidney failure with tubular necrosis: Secondary | ICD-10-CM | POA: Insufficient documentation

## 2023-07-07 DIAGNOSIS — N185 Chronic kidney disease, stage 5: Secondary | ICD-10-CM | POA: Insufficient documentation

## 2023-07-07 DIAGNOSIS — N183 Chronic kidney disease, stage 3 unspecified: Secondary | ICD-10-CM | POA: Insufficient documentation

## 2023-07-07 LAB — POCT HEMOGLOBIN-HEMACUE: Hemoglobin: 8.2 g/dL — ABNORMAL LOW (ref 12.0–15.0)

## 2023-07-07 MED ORDER — EPOETIN ALFA-EPBX 10000 UNIT/ML IJ SOLN
15000.0000 [IU] | INTRAMUSCULAR | Status: DC
  Administered 2023-07-07: 15000 [IU] via SUBCUTANEOUS

## 2023-07-07 MED ORDER — EPOETIN ALFA-EPBX 10000 UNIT/ML IJ SOLN
INTRAMUSCULAR | Status: AC
  Filled 2023-07-07: qty 2

## 2023-07-07 MED ORDER — SODIUM CHLORIDE 0.9 % IV SOLN
510.0000 mg | INTRAVENOUS | Status: DC
  Administered 2023-07-07: 510 mg via INTRAVENOUS
  Filled 2023-07-07: qty 510

## 2023-07-10 ENCOUNTER — Encounter (HOSPITAL_COMMUNITY): Payer: BC Managed Care – PPO

## 2023-07-21 ENCOUNTER — Encounter (HOSPITAL_COMMUNITY)
Admission: RE | Admit: 2023-07-21 | Discharge: 2023-07-21 | Disposition: A | Discharge status: Home / Self Care | Payer: BC Managed Care – PPO | Source: Ambulatory Visit | Attending: Nephrology | Admitting: Nephrology

## 2023-07-21 VITALS — BP 179/95 | HR 95 | Temp 97.8°F | Resp 18

## 2023-07-21 DIAGNOSIS — N183 Chronic kidney disease, stage 3 unspecified: Secondary | ICD-10-CM | POA: Diagnosis not present

## 2023-07-21 DIAGNOSIS — N170 Acute kidney failure with tubular necrosis: Secondary | ICD-10-CM | POA: Insufficient documentation

## 2023-07-21 DIAGNOSIS — N185 Chronic kidney disease, stage 5: Secondary | ICD-10-CM | POA: Insufficient documentation

## 2023-07-21 LAB — IRON AND TIBC
Iron: 72 ug/dL (ref 28–170)
Saturation Ratios: 34 % — ABNORMAL HIGH (ref 10.4–31.8)
TIBC: 210 ug/dL — ABNORMAL LOW (ref 250–450)
UIBC: 138 ug/dL

## 2023-07-21 LAB — FERRITIN: Ferritin: 323 ng/mL — ABNORMAL HIGH (ref 11–307)

## 2023-07-21 MED ORDER — EPOETIN ALFA-EPBX 10000 UNIT/ML IJ SOLN
INTRAMUSCULAR | Status: AC
  Filled 2023-07-21: qty 2

## 2023-07-21 MED ORDER — EPOETIN ALFA-EPBX 10000 UNIT/ML IJ SOLN
15000.0000 [IU] | INTRAMUSCULAR | Status: DC
Start: 2023-07-21 — End: 2024-08-02
  Administered 2023-07-21: 15000 [IU] via SUBCUTANEOUS

## 2023-07-24 LAB — POCT HEMOGLOBIN-HEMACUE: Hemoglobin: 8.8 g/dL — ABNORMAL LOW (ref 12.0–15.0)

## 2023-08-04 ENCOUNTER — Encounter (HOSPITAL_COMMUNITY): Payer: BC Managed Care – PPO

## 2023-08-18 ENCOUNTER — Encounter (HOSPITAL_COMMUNITY): Payer: BC Managed Care – PPO

## 2023-10-11 ENCOUNTER — Other Ambulatory Visit: Payer: Self-pay

## 2023-10-11 ENCOUNTER — Inpatient Hospital Stay (HOSPITAL_BASED_OUTPATIENT_CLINIC_OR_DEPARTMENT_OTHER)
Admission: EM | Admit: 2023-10-11 | Discharge: 2023-10-20 | DRG: 981 | Disposition: A | Discharge status: Home / Self Care | Attending: Internal Medicine | Admitting: Internal Medicine

## 2023-10-11 ENCOUNTER — Emergency Department (HOSPITAL_BASED_OUTPATIENT_CLINIC_OR_DEPARTMENT_OTHER)

## 2023-10-11 ENCOUNTER — Encounter (HOSPITAL_BASED_OUTPATIENT_CLINIC_OR_DEPARTMENT_OTHER): Payer: Self-pay

## 2023-10-11 DIAGNOSIS — T82511A Breakdown (mechanical) of surgically created arteriovenous shunt, initial encounter: Secondary | ICD-10-CM | POA: Diagnosis not present

## 2023-10-11 DIAGNOSIS — R079 Chest pain, unspecified: Secondary | ICD-10-CM | POA: Diagnosis not present

## 2023-10-11 DIAGNOSIS — I5030 Unspecified diastolic (congestive) heart failure: Secondary | ICD-10-CM | POA: Diagnosis not present

## 2023-10-11 DIAGNOSIS — Z8349 Family history of other endocrine, nutritional and metabolic diseases: Secondary | ICD-10-CM

## 2023-10-11 DIAGNOSIS — F419 Anxiety disorder, unspecified: Secondary | ICD-10-CM | POA: Diagnosis present

## 2023-10-11 DIAGNOSIS — I5032 Chronic diastolic (congestive) heart failure: Secondary | ICD-10-CM | POA: Diagnosis not present

## 2023-10-11 DIAGNOSIS — I132 Hypertensive heart and chronic kidney disease with heart failure and with stage 5 chronic kidney disease, or end stage renal disease: Secondary | ICD-10-CM | POA: Diagnosis not present

## 2023-10-11 DIAGNOSIS — Y712 Prosthetic and other implants, materials and accessory cardiovascular devices associated with adverse incidents: Secondary | ICD-10-CM | POA: Diagnosis not present

## 2023-10-11 DIAGNOSIS — Z8249 Family history of ischemic heart disease and other diseases of the circulatory system: Secondary | ICD-10-CM

## 2023-10-11 DIAGNOSIS — N2581 Secondary hyperparathyroidism of renal origin: Secondary | ICD-10-CM | POA: Diagnosis present

## 2023-10-11 DIAGNOSIS — B954 Other streptococcus as the cause of diseases classified elsewhere: Secondary | ICD-10-CM | POA: Diagnosis present

## 2023-10-11 DIAGNOSIS — R7881 Bacteremia: Secondary | ICD-10-CM | POA: Diagnosis not present

## 2023-10-11 DIAGNOSIS — N186 End stage renal disease: Secondary | ICD-10-CM | POA: Diagnosis not present

## 2023-10-11 DIAGNOSIS — E8809 Other disorders of plasma-protein metabolism, not elsewhere classified: Secondary | ICD-10-CM | POA: Diagnosis present

## 2023-10-11 DIAGNOSIS — Z789 Other specified health status: Secondary | ICD-10-CM

## 2023-10-11 DIAGNOSIS — R509 Fever, unspecified: Secondary | ICD-10-CM | POA: Diagnosis not present

## 2023-10-11 DIAGNOSIS — R0609 Other forms of dyspnea: Secondary | ICD-10-CM | POA: Diagnosis not present

## 2023-10-11 DIAGNOSIS — I5033 Acute on chronic diastolic (congestive) heart failure: Secondary | ICD-10-CM | POA: Diagnosis not present

## 2023-10-11 DIAGNOSIS — E66811 Obesity, class 1: Secondary | ICD-10-CM | POA: Diagnosis not present

## 2023-10-11 DIAGNOSIS — Z6831 Body mass index (BMI) 31.0-31.9, adult: Secondary | ICD-10-CM | POA: Diagnosis not present

## 2023-10-11 DIAGNOSIS — R202 Paresthesia of skin: Secondary | ICD-10-CM | POA: Diagnosis present

## 2023-10-11 DIAGNOSIS — E785 Hyperlipidemia, unspecified: Secondary | ICD-10-CM | POA: Diagnosis present

## 2023-10-11 DIAGNOSIS — B955 Unspecified streptococcus as the cause of diseases classified elsewhere: Secondary | ICD-10-CM | POA: Diagnosis present

## 2023-10-11 DIAGNOSIS — Z1152 Encounter for screening for COVID-19: Secondary | ICD-10-CM | POA: Diagnosis not present

## 2023-10-11 DIAGNOSIS — D631 Anemia in chronic kidney disease: Secondary | ICD-10-CM | POA: Diagnosis not present

## 2023-10-11 DIAGNOSIS — Z79899 Other long term (current) drug therapy: Secondary | ICD-10-CM | POA: Diagnosis not present

## 2023-10-11 DIAGNOSIS — Z9884 Bariatric surgery status: Secondary | ICD-10-CM | POA: Diagnosis not present

## 2023-10-11 DIAGNOSIS — G4733 Obstructive sleep apnea (adult) (pediatric): Secondary | ICD-10-CM | POA: Diagnosis present

## 2023-10-11 DIAGNOSIS — M546 Pain in thoracic spine: Secondary | ICD-10-CM | POA: Diagnosis not present

## 2023-10-11 DIAGNOSIS — N189 Chronic kidney disease, unspecified: Secondary | ICD-10-CM | POA: Diagnosis present

## 2023-10-11 DIAGNOSIS — E872 Acidosis, unspecified: Secondary | ICD-10-CM | POA: Diagnosis present

## 2023-10-11 DIAGNOSIS — R197 Diarrhea, unspecified: Secondary | ICD-10-CM | POA: Diagnosis not present

## 2023-10-11 DIAGNOSIS — I38 Endocarditis, valve unspecified: Secondary | ICD-10-CM | POA: Diagnosis not present

## 2023-10-11 DIAGNOSIS — Z992 Dependence on renal dialysis: Secondary | ICD-10-CM | POA: Diagnosis not present

## 2023-10-11 DIAGNOSIS — J42 Unspecified chronic bronchitis: Secondary | ICD-10-CM | POA: Diagnosis not present

## 2023-10-11 DIAGNOSIS — E1122 Type 2 diabetes mellitus with diabetic chronic kidney disease: Secondary | ICD-10-CM | POA: Diagnosis present

## 2023-10-11 DIAGNOSIS — R1111 Vomiting without nausea: Secondary | ICD-10-CM | POA: Diagnosis not present

## 2023-10-11 DIAGNOSIS — Z833 Family history of diabetes mellitus: Secondary | ICD-10-CM

## 2023-10-11 DIAGNOSIS — N185 Chronic kidney disease, stage 5: Secondary | ICD-10-CM | POA: Diagnosis not present

## 2023-10-11 DIAGNOSIS — M7989 Other specified soft tissue disorders: Secondary | ICD-10-CM | POA: Diagnosis present

## 2023-10-11 DIAGNOSIS — R9389 Abnormal findings on diagnostic imaging of other specified body structures: Secondary | ICD-10-CM | POA: Diagnosis not present

## 2023-10-11 DIAGNOSIS — R609 Edema, unspecified: Secondary | ICD-10-CM | POA: Diagnosis not present

## 2023-10-11 DIAGNOSIS — R0789 Other chest pain: Secondary | ICD-10-CM | POA: Diagnosis not present

## 2023-10-11 DIAGNOSIS — M542 Cervicalgia: Secondary | ICD-10-CM | POA: Diagnosis not present

## 2023-10-11 LAB — RESP PANEL BY RT-PCR (RSV, FLU A&B, COVID)  RVPGX2
Influenza A by PCR: NEGATIVE
Influenza B by PCR: NEGATIVE
Resp Syncytial Virus by PCR: NEGATIVE
SARS Coronavirus 2 by RT PCR: NEGATIVE

## 2023-10-11 LAB — MAGNESIUM: Magnesium: 1.9 mg/dL (ref 1.7–2.4)

## 2023-10-11 LAB — BASIC METABOLIC PANEL
Anion gap: 11 (ref 5–15)
Anion gap: 12 (ref 5–15)
BUN: 60 mg/dL — ABNORMAL HIGH (ref 6–20)
BUN: 61 mg/dL — ABNORMAL HIGH (ref 6–20)
CO2: 18 mmol/L — ABNORMAL LOW (ref 22–32)
CO2: 14 mmol/L — ABNORMAL LOW (ref 22–32)
Calcium: 5.7 mg/dL — CL (ref 8.9–10.3)
Calcium: 5.8 mg/dL — CL (ref 8.9–10.3)
Chloride: 111 mmol/L (ref 98–111)
Chloride: 114 mmol/L — ABNORMAL HIGH (ref 98–111)
Creatinine, Ser: 7.13 mg/dL — ABNORMAL HIGH (ref 0.44–1.00)
Creatinine, Ser: 6.87 mg/dL — ABNORMAL HIGH (ref 0.44–1.00)
GFR, Estimated: 7 mL/min — ABNORMAL LOW (ref >60)
GFR, Estimated: 7 mL/min — ABNORMAL LOW (ref >60)
Glucose, Bld: 82 mg/dL (ref 70–99)
Glucose, Bld: 78 mg/dL (ref 70–99)
Potassium: 3.7 mmol/L (ref 3.5–5.1)
Potassium: 3.8 mmol/L (ref 3.5–5.1)
Sodium: 140 mmol/L (ref 135–145)
Sodium: 140 mmol/L (ref 135–145)

## 2023-10-11 LAB — CBC
HCT: 22.5 % — ABNORMAL LOW (ref 36.0–46.0)
Hemoglobin: 7.4 g/dL — ABNORMAL LOW (ref 12.0–15.0)
MCH: 29.7 pg (ref 26.0–34.0)
MCHC: 32.9 g/dL (ref 30.0–36.0)
MCV: 90.4 fL (ref 80.0–100.0)
Platelets: 215 10*3/uL (ref 150–400)
RBC: 2.49 MIL/uL — ABNORMAL LOW (ref 3.87–5.11)
RDW: 14.6 % (ref 11.5–15.5)
WBC: 7 10*3/uL (ref 4.0–10.5)
nRBC: 0 % (ref 0.0–0.2)

## 2023-10-11 LAB — TROPONIN I (HIGH SENSITIVITY)
Troponin I (High Sensitivity): 10 ng/L (ref <18)
Troponin I (High Sensitivity): 9 ng/L (ref <18)

## 2023-10-11 LAB — FERRITIN: Ferritin: 187 ng/mL (ref 11–307)

## 2023-10-11 LAB — PREGNANCY, URINE: Preg Test, Ur: NEGATIVE

## 2023-10-11 LAB — ALBUMIN: Albumin: 2.8 g/dL — ABNORMAL LOW (ref 3.5–5.0)

## 2023-10-11 LAB — PHOSPHORUS: Phosphorus: 4.9 mg/dL — ABNORMAL HIGH (ref 2.5–4.6)

## 2023-10-11 LAB — IRON AND TIBC
Iron: 41 ug/dL (ref 28–170)
Saturation Ratios: 23 % (ref 10.4–31.8)
TIBC: 176 ug/dL — ABNORMAL LOW (ref 250–450)
UIBC: 135 ug/dL

## 2023-10-11 LAB — HIV ANTIBODY (ROUTINE TESTING W REFLEX): HIV Screen 4th Generation wRfx: NONREACTIVE

## 2023-10-11 LAB — HEPATITIS B SURFACE ANTIGEN: Hepatitis B Surface Ag: NONREACTIVE

## 2023-10-11 LAB — VITAMIN D 25 HYDROXY (VIT D DEFICIENCY, FRACTURES): Vit D, 25-Hydroxy: 31.4 ng/mL (ref 30–100)

## 2023-10-11 LAB — BRAIN NATRIURETIC PEPTIDE: B Natriuretic Peptide: 103.5 pg/mL — ABNORMAL HIGH (ref 0.0–100.0)

## 2023-10-11 MED ORDER — HEPARIN SODIUM (PORCINE) 5000 UNIT/ML IJ SOLN
5000.0000 [IU] | Freq: Three times a day (TID) | INTRAMUSCULAR | Status: DC
  Administered 2023-10-11 – 2023-10-13 (×7): 5000 [IU] via SUBCUTANEOUS
  Filled 2023-10-11 (×7): qty 1

## 2023-10-11 MED ORDER — ACETAMINOPHEN 650 MG RE SUPP
650.0000 mg | Freq: Four times a day (QID) | RECTAL | Status: DC | PRN
Start: 2023-10-11 — End: 2023-10-20

## 2023-10-11 MED ORDER — ONDANSETRON HCL 4 MG PO TABS
4.0000 mg | ORAL_TABLET | Freq: Four times a day (QID) | ORAL | Status: DC | PRN
  Administered 2023-10-12: 4 mg via ORAL
  Filled 2023-10-11: qty 1

## 2023-10-11 MED ORDER — CALCIUM ACETATE (PHOS BINDER) 667 MG PO CAPS: 2001.0000 mg | ORAL_CAPSULE | Freq: Once | ORAL | Status: DC

## 2023-10-11 MED ORDER — OXYCODONE HCL 5 MG PO TABS
5.0000 mg | ORAL_TABLET | ORAL | Status: DC | PRN
  Administered 2023-10-11 – 2023-10-20 (×22): 5 mg via ORAL
  Filled 2023-10-11 (×22): qty 1

## 2023-10-11 MED ORDER — HYDRALAZINE HCL 50 MG PO TABS
50.0000 mg | ORAL_TABLET | Freq: Three times a day (TID) | ORAL | Status: DC
  Administered 2023-10-11 – 2023-10-15 (×11): 50 mg via ORAL
  Filled 2023-10-11 (×11): qty 1

## 2023-10-11 MED ORDER — GABAPENTIN 300 MG PO CAPS
300.0000 mg | ORAL_CAPSULE | Freq: Every day | ORAL | Status: DC
  Administered 2023-10-11 – 2023-10-20 (×9): 300 mg via ORAL
  Filled 2023-10-11 (×9): qty 1

## 2023-10-11 MED ORDER — AMLODIPINE BESYLATE 10 MG PO TABS
10.0000 mg | ORAL_TABLET | Freq: Every day | ORAL | Status: DC
  Administered 2023-10-11 – 2023-10-16 (×5): 10 mg via ORAL
  Filled 2023-10-11 (×5): qty 1

## 2023-10-11 MED ORDER — CALCITRIOL 0.5 MCG PO CAPS
0.5000 ug | ORAL_CAPSULE | Freq: Three times a day (TID) | ORAL | Status: DC
  Administered 2023-10-11 – 2023-10-20 (×24): 0.5 ug via ORAL
  Filled 2023-10-11 (×23): qty 1

## 2023-10-11 MED ORDER — CALCIUM GLUCONATE-NACL 1-0.675 GM/50ML-% IV SOLN
1.0000 g | Freq: Once | INTRAVENOUS | Status: AC
  Administered 2023-10-11: 1000 mg via INTRAVENOUS
  Filled 2023-10-11: qty 50

## 2023-10-11 MED ORDER — MORPHINE SULFATE (PF) 4 MG/ML IV SOLN
4.0000 mg | Freq: Once | INTRAVENOUS | Status: AC
  Administered 2023-10-11: 4 mg via INTRAVENOUS
  Filled 2023-10-11: qty 1

## 2023-10-11 MED ORDER — CHLORHEXIDINE GLUCONATE CLOTH 2 % EX PADS
6.0000 | MEDICATED_PAD | Freq: Every day | CUTANEOUS | Status: DC
  Administered 2023-10-12: 6 via TOPICAL

## 2023-10-11 MED ORDER — CALCIUM CARBONATE ANTACID 500 MG PO CHEW
5.0000 | CHEWABLE_TABLET | Freq: Once | ORAL | Status: AC
  Administered 2023-10-11: 1000 mg via ORAL
  Filled 2023-10-11: qty 5

## 2023-10-11 MED ORDER — ONDANSETRON HCL 4 MG/2ML IJ SOLN: 4.0000 mg | Freq: Four times a day (QID) | INTRAMUSCULAR | Status: DC | PRN

## 2023-10-11 MED ORDER — ACETAMINOPHEN 325 MG PO TABS
650.0000 mg | ORAL_TABLET | Freq: Four times a day (QID) | ORAL | Status: DC | PRN
  Administered 2023-10-11 – 2023-10-14 (×5): 650 mg via ORAL
  Filled 2023-10-11 (×5): qty 2

## 2023-10-11 MED ORDER — SENNOSIDES-DOCUSATE SODIUM 8.6-50 MG PO TABS: 1.0000 | ORAL_TABLET | Freq: Every evening | ORAL | Status: DC | PRN

## 2023-10-11 MED ORDER — ONDANSETRON HCL 4 MG/2ML IJ SOLN
4.0000 mg | Freq: Once | INTRAMUSCULAR | Status: AC
  Administered 2023-10-11: 4 mg via INTRAVENOUS
  Filled 2023-10-11: qty 2

## 2023-10-11 MED ORDER — CALCIUM CARBONATE 1250 (500 CA) MG PO TABS: 1000.0000 mg | ORAL_TABLET | Freq: Once | ORAL | Status: DC

## 2023-10-11 MED ORDER — SODIUM CHLORIDE 0.9 % IV BOLUS
500.0000 mL | Freq: Once | INTRAVENOUS | Status: AC
  Administered 2023-10-11: 500 mL via INTRAVENOUS

## 2023-10-11 NOTE — ED Triage Notes (Signed)
 Reports URI symptoms and fevers for 1 week. Reports increased BLE swelling, SHOB, mid chest pain for 2 days

## 2023-10-11 NOTE — ED Provider Notes (Signed)
 [place] [facility] DEPARTMENT AT [facility] Provider Note   CSN: [id number] Arrival date & time: 10/11/23  0751     History  Chief Complaint  Patient presents with   Chest Pain    Jacqueline [name] is a 43 y.o. female.  HPI   43 year old female with past medical history of HTN, DM, CKD presents to the emergency department with generalized symptoms.  Patient is complaining of URI symptoms and intermittent fevers for about a week.  She states Tmax is around 102.  She has had some nasal congestion, nausea/vomiting and loose stools.  She also has had sick contacts with her young kids.  Recently she feels like she has had some mild bilateral leg swelling, worse than baseline as well as shortness of breath and midsternal chest discomfort.  She is also complaining of whole body myalgias, muscle cramping, tingling sensation in her hands and feet as well as hand cramping.  Home Medications Prior to Admission medications   Medication Sig Start Date End Date Taking? Authorizing Provider  amLODipine (NORVASC) 10 MG tablet Take 1 tablet (10 mg total) by mouth daily. 07/07/22  Yes Angiulli, Mcarthur Rossetti, PA-C  calcitRIOL (ROCALTROL) 0.5 MCG capsule Take 0.5 mcg by mouth 3 (three) times daily. 10/03/23  Yes [provider]  ciclopirox (PENLAC) 8 % solution Apply 1 Application topically at bedtime. Apply over nail and surrounding skin. Apply daily over previous coat. After seven (7) days, may remove with alcohol and continue cycle.   Yes [provider]  furosemide (LASIX) 40 MG tablet Take 1 tablet (40 mg total) by mouth daily. Patient taking differently: Take 80 mg by mouth daily. 07/07/22  Yes Angiulli, Mcarthur Rossetti, PA-C  gabapentin (NEURONTIN) 300 MG capsule Take 1 capsule (300 mg total) by mouth at bedtime. 07/07/22  Yes Angiulli, Mcarthur Rossetti, PA-C  hydrALAZINE (APRESOLINE) 50 MG tablet Take 50 mg by mouth 3 (three) times daily.   Yes [provider]  Vitamin D,  Ergocalciferol, (DRISDOL) 1.25 MG (50000 UNIT) CAPS capsule Take 1 capsule (50,000 Units total) by mouth every Thursday. Patient taking differently: Take 50,000 Units by mouth every 7 (seven) days. Mondays 07/07/22  Yes Angiulli, Mcarthur Rossetti, PA-C  calcitRIOL (ROCALTROL) 0.25 MCG capsule Take 1 capsule (0.25 mcg total) by mouth daily. Patient not taking: Reported on 10/11/2023 07/07/22   Angiulli, Mcarthur Rossetti, PA-C  ondansetron (ZOFRAN-ODT) 4 MG disintegrating tablet Take 1 tablet (4 mg total) by mouth every 8 (eight) hours as needed for nausea or vomiting. Patient not taking: Reported on 10/11/2023 05/09/23   Lonell Grandchild, MD  oxyCODONE-acetaminophen (PERCOCET/ROXICET) 5-325 MG tablet Take 1 tablet by mouth every 6 (six) hours as needed. Patient not taking: Reported on 10/11/2023 05/02/23   Lars Mage, PA-C  sertraline (ZOLOFT) 100 MG tablet Take 1 tablet (100 mg total) by mouth at bedtime. Patient not taking: Reported on 10/11/2023 07/07/22   Angiulli, Mcarthur Rossetti, PA-C  sodium bicarbonate 650 MG tablet Take 2 tablets (1,300 mg total) by mouth 2 (two) times daily. Patient not taking: Reported on 10/11/2023 07/07/22   Charlton Amor, PA-C      Allergies    Patient has no known allergies.    Review of Systems   Review of Systems  Constitutional:  Positive for chills, fatigue and fever.  HENT:  Positive for congestion.   Eyes:  Negative for visual disturbance.  Respiratory:  Positive for shortness of breath. Negative for cough.   Cardiovascular:  Positive for  chest pain and leg swelling. Negative for palpitations.  Gastrointestinal:  Positive for nausea and vomiting. Negative for abdominal pain and diarrhea.  Musculoskeletal:  Positive for back pain, myalgias and neck pain.  Skin:  Negative for rash.  Neurological:  Negative for headaches.    Physical Exam Updated Vital Signs BP (!) 155/89   Pulse 90   Temp 98.3 F (36.8 C) (Oral)   Resp 19   Ht 5\' 4"  (1.626 m)   Wt 79.4 kg    LMP 10/09/2023 (Exact Date)   SpO2 100%   BMI 30.04 kg/m  Physical Exam Vitals and nursing note reviewed.  Constitutional:      General: She is not in acute distress.    Appearance: Normal appearance.  HENT:     Head: Normocephalic.     Mouth/Throat:     Mouth: Mucous membranes are moist.  Neck:     Comments: Full range of motion but painful when looking down without rigidity Cardiovascular:     Rate and Rhythm: Normal rate.  Pulmonary:     Effort: Pulmonary effort is normal. No respiratory distress.  Abdominal:     Palpations: Abdomen is soft.     Tenderness: There is no abdominal tenderness.  Musculoskeletal:     Comments: Mild 1+ pitting edema of the lower calf/ankles  Skin:    General: Skin is warm.  Neurological:     Mental Status: She is alert and oriented to person, place, and time. Mental status is at baseline.  Psychiatric:        Mood and Affect: Mood normal.     ED Results / Procedures / Treatments   Labs (all labs ordered are listed, but only abnormal results are displayed) Labs Reviewed  CBC - Abnormal; Notable for the following components:      Result Value   RBC 2.49 (*)    Hemoglobin 7.4 (*)    HCT 22.5 (*)    All other components within normal limits  RESP PANEL BY RT-PCR (RSV, FLU A&B, COVID)  [id number]  BASIC METABOLIC PANEL  PREGNANCY, URINE  BRAIN NATRIURETIC PEPTIDE  HCG, SERUM, QUALITATIVE  TROPONIN I (HIGH SENSITIVITY)  TROPONIN I (HIGH SENSITIVITY)    EKG EKG Interpretation Date/Time:  Wednesday October 11 2023 07:59:51 EDT Ventricular Rate:  99 PR Interval:  140 QRS Duration:  85 QT Interval:  375 QTC Calculation: 482 R Axis:   72  Text Interpretation: Sinus rhythm Borderline T abnormalities, diffuse leads similar to previous Confirmed by Coralee Pesa [phone]) on 10/11/2023 8:27:13 AM  Radiology DG Chest 2 View Result Date: 10/11/2023 CLINICAL DATA:  Midline chest pain. EXAM: CHEST - 2 VIEW COMPARISON:  08/29/2022. FINDINGS:  Bilateral lung fields are clear. Bilateral costophrenic angles are clear. Note is made of elevated right hemidiaphragm. Normal cardio-mediastinal silhouette. No acute osseous abnormalities. The soft tissues are within normal limits. IMPRESSION: No active cardiopulmonary disease. Electronically Signed   By: Jules Schick M.D.   On: 10/11/2023 09:42    Procedures .Ultrasound ED Peripheral IV (Provider)  Date/Time: 10/11/2023 9:57 AM  Performed by: Rozelle Logan, DO Authorized by: Rozelle Logan, DO   Procedure details:    Indications: multiple failed IV attempts and poor IV access     Skin Prep: chlorhexidine gluconate     Location:  Right AC   Angiocath:  20 G   Bedside Ultrasound Guided: Yes     Images: not archived     Patient tolerated procedure without complications: Yes  Dressing applied: Yes   .Critical Care  Performed by: Rozelle Logan, DO Authorized by: Rozelle Logan, DO   Critical care provider statement:    Critical care time (minutes):  30   Critical care time was exclusive of:  Separately billable procedures and treating other patients   Critical care was necessary to treat or prevent imminent or life-threatening deterioration of the following conditions:  Metabolic crisis   Critical care was time spent personally by me on the following activities:  Development of treatment plan with patient or surrogate, discussions with consultants, evaluation of patient's response to treatment, examination of patient, ordering and review of laboratory studies, ordering and review of radiographic studies, ordering and performing treatments and interventions, pulse oximetry, re-evaluation of patient's condition and review of old charts   I assumed direction of critical care for this patient from another provider in my specialty: no     Care discussed with: admitting provider       Medications Ordered in ED Medications  ondansetron (ZOFRAN) injection 4 mg (has no  administration in time range)  morphine (PF) 4 MG/ML injection 4 mg (has no administration in time range)  sodium chloride 0.9 % bolus 500 mL (has no administration in time range)    ED Course/ Medical Decision Making/ A&P                                 Medical Decision Making Amount and/or Complexity of Data Reviewed Labs: ordered. Radiology: ordered.  Risk OTC drugs. Prescription drug management. Decision regarding hospitalization.   43 year old female presents emergency department with generalized illness for the past week progressing to body cramping, paresthesias.  She has history of CKD, has a left arm fistula but is not requiring hemodialysis.  Blood work shows kidney function along the lines of her CKD with a baseline anemia, normal troponin.  She has a severe hypocalcemia 5.7 with a normal magnesium.  Respiratory panel is negative.  Chest x-ray does not show any focal finding.  EKG is sinus rhythm, normal intervals and normal QTc.  After failed IV attempts I placed an ultrasound-guided IV.  We were referred to give fluid and some medicine until this IV became nonfunctional.  We are limited in terms of p.o. replacement for calcium at this facility and at the level of hypocalcemia.  She will require transfer and admission for more reliable IV access and IV calcium with repeat blood work.  She continues to have whole body cramping.  Patients evaluation and results requires admission for further treatment and care.  Spoke with hospitalist, reviewed patient's ED course and they accept admission.  Patient agrees with admission plan, offers no new complaints and is stable/unchanged at time of admit.        Final Clinical Impression(s) / ED Diagnoses Final diagnoses:  None    Rx / DC Orders ED Discharge Orders     None         Rozelle Logan, DO 10/11/23 1410

## 2023-10-11 NOTE — ED Notes (Signed)
 Difficulty pulling blood from IV, EDP notified. EDP stated to hold on 2nd troponin

## 2023-10-11 NOTE — ED Notes (Signed)
 Attempted IV access x 2.  Unable to obtain.  Pt states she is a hard stick every time as we cannot utilize her left arm

## 2023-10-11 NOTE — ED Notes (Signed)
 IV attempt x1, unsuccessful, blood for labs obtained.

## 2023-10-11 NOTE — ED Notes (Signed)
 RRT going to attempt USIV  Pt currently in XR

## 2023-10-11 NOTE — H&P (Addendum)
 History and Physical    PatientTamber [name] ZOX:[id number] DOB: 01-Sep-1980 DOA: 10/11/2023 DOS: the patient was seen and examined on 10/11/2023 PCP: Harvest Forest, MD  Patient coming from: Home  Chief Complaint:  Chief Complaint  Patient presents with   Chest Pain   HPI: Jacqueline [name] is a 43 y.o. female with medical history significant of HTN, HLD, DM 2, CKD stage V, CHF, presented to [facility] initially, with multiple complaints including generalized weakness, fevers with Tmax of around 102, URI symptoms, nasal congestion, some nausea/vomiting with some loose stools.  Noted her 2 children 9 years and 11 years had been sick so she may have contracted something from them.  Above symptoms have been going on for about a week, but continues to worsen now with some shortness of breath/dyspnea with some mild chest discomfort.  Also noted recent muscle cramping with tingling sensations in hands and feet.  Denies any abdominal pain, melena, hematochezia, BRBPR, dizziness, falls.  Prompted patient to go to [facility] where workup was done which showed severe hypocalcemia.  Other labs noted creatinine 7.13, BNP 103.5, troponin WNL x 2, hemoglobin 7.4, baseline around 8.  Influenza, RSV and COVID all negative.  Chest x-ray unremarkable.  Vital signs fairly stable.  Due to severe hypocalcemia patient was transferred to [facility] for nephrology evaluation and further workup.  [facility] hospitalist asked to admit patient's.  Of note, patient had been followed by [facility], had AV graft in her left upper extremity placed on 05/02/2023, acceptable for use.    Review of Systems: As mentioned in the history of present illness. All other systems reviewed and are negative.    Past Medical History:  Diagnosis Date   Anemia    Anxiety    Chronic bronchitis (HCC)    Chronic kidney disease    Hypertension    Increased frequency of headaches    Morbid obesity  (HCC)    Necrotizing fasciitis (HCC)    Sleep apnea    does not use CPAP - - gastric bypass   Type II diabetes mellitus (HCC)    no meds, diet controlled   Past Surgical History:  Procedure Laterality Date   AV FISTULA PLACEMENT Left 05/02/2023   Procedure: LEFT ARM ARTERIOVENOUS (AV) FISTULA  GRAFT;  Surgeon: Daria Pastures, MD;  Location: [facility] OR;  Service: Vascular;  Laterality: Left;   BARIATRIC SURGERY     BREAST REDUCTION SURGERY  03/21/2017   CARDIAC CATHETERIZATION  01/06/2016   CARDIAC CATHETERIZATION N/A 01/06/2016   Procedure: Left Heart Cath and Coronary Angiography;  Surgeon: Iran Ouch, MD;  Location: [facility] INVASIVE CV LAB;  Service: Cardiovascular;  Laterality: N/A;   CESAREAN SECTION  08/2014   I & D EXTREMITY Right 06/18/2022   Procedure: IRRIGATION AND DEBRIDEMENT RIGHT ABDOMEN AND THIGH;  Surgeon: Kinsinger, De Blanch, MD;  Location: [facility] OR;  Service: General;  Laterality: Right;   I & D EXTREMITY Right 06/20/2022   Procedure: WOUND EXPLORATION OF RIGHT THIGH AND RIGHT GROIN WITH IRRIGATION AND DEBRIDEMENT;  Surgeon: Griselda Miner, MD;  Location: [facility] OR;  Service: General;  Laterality: Right;   REDUCTION MAMMAPLASTY Bilateral 03/21/2017   Social History:  reports that she has never smoked. She has never used smokeless tobacco. She reports that she does not drink alcohol and does not use drugs.  No Known Allergies  Family History  Problem Relation Age of Onset   Diabetes Mother  Hypertension Mother    Thyroid disease Mother    Kidney disease Maternal Grandmother    Diabetes Maternal Grandmother    Heart attack Other     Prior to Admission medications   Medication Sig Start Date End Date Taking? Authorizing Provider  amLODipine (NORVASC) 10 MG tablet Take 1 tablet (10 mg total) by mouth daily. 07/07/22  Yes Angiulli, Mcarthur Rossetti, PA-C  calcitRIOL (ROCALTROL) 0.5 MCG capsule Take 0.5 mcg by mouth 3 (three) times daily. 10/03/23  Yes [provider]   ciclopirox (PENLAC) 8 % solution Apply 1 Application topically at bedtime. Apply over nail and surrounding skin. Apply daily over previous coat. After seven (7) days, may remove with alcohol and continue cycle.   Yes [provider]  furosemide (LASIX) 40 MG tablet Take 1 tablet (40 mg total) by mouth daily. Patient taking differently: Take 80 mg by mouth daily. 07/07/22  Yes Angiulli, Mcarthur Rossetti, PA-C  gabapentin (NEURONTIN) 300 MG capsule Take 1 capsule (300 mg total) by mouth at bedtime. 07/07/22  Yes Angiulli, Mcarthur Rossetti, PA-C  hydrALAZINE (APRESOLINE) 50 MG tablet Take 50 mg by mouth 3 (three) times daily.   Yes [provider]  Vitamin D, Ergocalciferol, (DRISDOL) 1.25 MG (50000 UNIT) CAPS capsule Take 1 capsule (50,000 Units total) by mouth every Thursday. Patient taking differently: Take 50,000 Units by mouth every 7 (seven) days. Mondays 07/07/22  Yes Angiulli, Mcarthur Rossetti, PA-C  calcitRIOL (ROCALTROL) 0.25 MCG capsule Take 1 capsule (0.25 mcg total) by mouth daily. Patient not taking: Reported on 10/11/2023 07/07/22   Angiulli, Mcarthur Rossetti, PA-C  ondansetron (ZOFRAN-ODT) 4 MG disintegrating tablet Take 1 tablet (4 mg total) by mouth every 8 (eight) hours as needed for nausea or vomiting. Patient not taking: Reported on 10/11/2023 05/09/23   Lonell Grandchild, MD  oxyCODONE-acetaminophen (PERCOCET/ROXICET) 5-325 MG tablet Take 1 tablet by mouth every 6 (six) hours as needed. Patient not taking: Reported on 10/11/2023 05/02/23   Lars Mage, PA-C  sertraline (ZOLOFT) 100 MG tablet Take 1 tablet (100 mg total) by mouth at bedtime. Patient not taking: Reported on 10/11/2023 07/07/22   Angiulli, Mcarthur Rossetti, PA-C  sodium bicarbonate 650 MG tablet Take 2 tablets (1,300 mg total) by mouth 2 (two) times daily. Patient not taking: Reported on 10/11/2023 07/07/22   Charlton Amor, PA-C    Physical Exam: Vitals:   10/11/23 1230 10/11/23 1237 10/11/23 1400 10/11/23 1520  BP:  125/63   (!) 174/96  Pulse:  98  95  Resp:  18  20  Temp: 98.3 F (36.8 C)  98.2 F (36.8 C) 98.7 F (37.1 C)  TempSrc: Oral  Oral Oral  SpO2:  100%  100%  Weight:      Height:       General: NAD  Cardiovascular: S1, S2 present Respiratory: CTAB Abdomen: Soft, nontender, nondistended, bowel sounds present Musculoskeletal: bilateral pedal edema noted, left upper extremity AV graft noted Skin: Normal Psychiatry: Normal mood   Data Reviewed: As mentioned above  Assessment and Plan: Noted severe hypocalcemia Calcium noted to be 5.7 Reported tingling in hands and feet, with muscle cramping Nephrology consulted, received calcium gluconate Continue calcitriol PTH pending Monitor renal function panel  Possible viral URI Infectious workup Noted sick contacts with kids Noted fevers, Tmax of 102 at home, none since admission COVID/RSV/influenza negative BC x 2 pending UA/UC ordered and pending (patient still makes urine, denies any dysuria)  CKD stage V with progression to ESRD Metabolic acidosis  Nephrology on board, plan for HD overnight Patient still makes urine  Anemia of CKD Hemoglobin 7.4, baseline around 8 Anemia panel pending Nephrologist plans to discuss ESA Daily CBC  Heart failure with preserved EF Management with dialysis Repeat echo pending  Hypertension Continue amlodipine, hydralazine  Diabetes mellitus type 2 Repeat A1c pending Not on any meds, diet controlled  History of morbid obesity status post gastric bypass BMI 30, obesity Continue lifestyle modification      Advance Care Planning:   Code Status: Full Code   Consults: Nephrology  Family Communication: None at bedside  Severity of Illness: The appropriate patient status for this patient is INPATIENT. Inpatient status is judged to be reasonable and necessary in order to provide the required intensity of service to ensure the patient's safety. The patient's presenting symptoms, physical exam  findings, and initial radiographic and laboratory data in the context of their chronic comorbidities is felt to place them at high risk for further clinical deterioration. Furthermore, it is not anticipated that the patient will be medically stable for discharge from the hospital within 2 midnights of admission.   * I certify that at the point of admission it is my clinical judgment that the patient will require inpatient hospital care spanning beyond 2 midnights from the point of admission due to high intensity of service, high risk for further deterioration and high frequency of surveillance required.*  Author: Briant Cedar, MD 10/11/2023 7:48 PM  For on call review [email or web address].

## 2023-10-11 NOTE — Consult Note (Addendum)
 [facility] Renal Consultation Note  Requesting MD:  Indication for Consultation:   HPI:  Jacqueline [name] is a 43 y.o. female with medical hx of HTN, HLD, DMII, CKD V, HFpEF who is presenting to the ED with multiple symptoms including chest pain, muscle pains, malaise and fevers. She presented to [facility] and was transferred here for admission as her calcium was critically low. We have been consulted on for her renal dysfunction and low calcium. She has a hx of hypocalcemia and I suspect this is secondary to her CKD. She is on calcitriol and vitamin D supplementation. She is also on lasix which can deplete calcium levels. On exam, she is very pleasant. Her symptoms have been going on for weeks and states since her fevers were not resolving she presented to the ED.   Creatinine, Ser  Date/Time Value Ref Range Status  10/11/2023 09:20 AM 7.13 (H) 0.44 - 1.00 mg/dL Final  16/04/9603 54:09 AM 6.05 (H) 0.44 - 1.00 mg/dL Final  81/19/1478 29:56 AM 6.20 (H) 0.44 - 1.00 mg/dL Final  21/30/8657 84:69 AM 4.30 (H) 0.44 - 1.00 mg/dL Final  62/95/2841 32:44 AM 4.19 (H) 0.44 - 1.00 mg/dL Final  07/20/7251 66:44 AM 4.32 (H) 0.44 - 1.00 mg/dL Final  03/47/4259 56:38 PM 4.55 (H) 0.44 - 1.00 mg/dL Final  75/64/3329 51:88 AM 5.09 (H) 0.44 - 1.00 mg/dL Final  41/66/0630 16:01 AM 4.94 (H) 0.44 - 1.00 mg/dL Final  09/32/3557 32:20 AM 5.03 (H) 0.44 - 1.00 mg/dL Final  25/42/7062 37:62 AM 4.78 (H) 0.44 - 1.00 mg/dL Final  83/15/1761 60:73 PM 4.87 (H) 0.44 - 1.00 mg/dL Final  71/12/2692 85:46 AM 4.81 (H) 0.44 - 1.00 mg/dL Final  27/09/5007 38:18 AM 4.81 (H) 0.44 - 1.00 mg/dL Final  29/93/7169 67:89 AM 5.37 (H) 0.44 - 1.00 mg/dL Final  38/04/1750 02:58 AM 4.99 (H) 0.44 - 1.00 mg/dL Final  52/77/8242 35:36 AM 5.22 (H) 0.44 - 1.00 mg/dL Final  14/43/1540 08:67 AM 5.50 (H) 0.44 - 1.00 mg/dL Final  61/95/0932 67:12 AM 5.77 (H) 0.44 - 1.00 mg/dL Final  45/80/9983 38:25 AM 6.50 (H) 0.44 - 1.00  mg/dL Final  05/39/7673 41:93 AM 6.92 (H) 0.44 - 1.00 mg/dL Final  79/08/4095 35:32 AM 6.52 (H) 0.44 - 1.00 mg/dL Final  99/24/2683 41:96 AM 6.62 (H) 0.44 - 1.00 mg/dL Final  22/29/7989 21:19 AM 6.03 (H) 0.44 - 1.00 mg/dL Final  41/74/0814 48:18 PM 5.86 (H) 0.44 - 1.00 mg/dL Final  56/31/4970 26:37 AM 5.55 (H) 0.44 - 1.00 mg/dL Final  85/88/5027 74:12 PM 5.53 (H) 0.44 - 1.00 mg/dL Final  87/86/7672 09:47 AM 5.20 (H) 0.44 - 1.00 mg/dL Final  09/62/8366 29:47 AM 5.33 (H) 0.44 - 1.00 mg/dL Final  65/46/5035 46:56 PM 5.15 (H) 0.44 - 1.00 mg/dL Final  81/27/5170 01:74 AM 5.57 (H) 0.44 - 1.00 mg/dL Final  94/49/6759 16:38 PM 4.10 (H) 0.44 - 1.00 mg/dL Final  46/65/9935 70:17 PM 4.21 (H) 0.44 - 1.00 mg/dL Final  79/39/0300 92:33 AM 3.54 (H) 0.44 - 1.00 mg/dL Final  00/76/2263 33:54 AM 3.24 (H) 0.44 - 1.00 mg/dL Final  56/25/6389 37:34 AM 2.94 (H) 0.44 - 1.00 mg/dL Final  28/76/8115 72:62 AM 2.97 (H) 0.44 - 1.00 mg/dL Final  03/55/9741 63:84 AM 3.14 (H) 0.44 - 1.00 mg/dL Final  53/64/6803 21:22 AM 2.25 (H) 0.44 - 1.00 mg/dL Final  48/25/0037 04:88 AM 2.14 (H) 0.44 - 1.00 mg/dL Final  89/16/9450 38:88 AM 2.18 (H) 0.44 -  1.00 mg/dL Final  52/84/1324 40:10 AM 1.94 (H) 0.44 - 1.00 mg/dL Final  27/25/3664 40:34 AM 2.25 (H) 0.44 - 1.00 mg/dL Final  74/25/9563 87:56 AM 2.08 (H) 0.44 - 1.00 mg/dL Final  43/32/9518 84:16 AM 1.89 (H) 0.44 - 1.00 mg/dL Final  60/63/0160 10:93 PM 1.78 (H) 0.44 - 1.00 mg/dL Final  23/55/7322 02:54 AM 2.09 (H) 0.44 - 1.00 mg/dL Final  27/12/2374 28:31 AM 2.82 (H) 0.44 - 1.00 mg/dL Final  51/76/1607 37:10 AM 2.02 (H) 0.44 - 1.00 mg/dL Final  62/69/4854 62:70 AM 2.18 (H) 0.44 - 1.00 mg/dL Final  35/00/9381 82:99 AM 2.16 (H) 0.44 - 1.00 mg/dL Final  37/16/9678 93:81 AM 2.38 (H) 0.44 - 1.00 mg/dL Final     PMHx:   Past Medical History:  Diagnosis Date   Anemia    Anxiety    Chronic bronchitis (HCC)    Chronic kidney disease    Hypertension    Increased frequency  of headaches    Morbid obesity (HCC)    Necrotizing fasciitis (HCC)    Sleep apnea    does not use CPAP - - gastric bypass   Type II diabetes mellitus (HCC)    no meds, diet controlled    Past Surgical History:  Procedure Laterality Date   AV FISTULA PLACEMENT Left 05/02/2023   Procedure: LEFT ARM ARTERIOVENOUS (AV) FISTULA  GRAFT;  Surgeon: Daria Pastures, MD;  Location: [facility] OR;  Service: Vascular;  Laterality: Left;   BARIATRIC SURGERY     BREAST REDUCTION SURGERY  03/21/2017   CARDIAC CATHETERIZATION  01/06/2016   CARDIAC CATHETERIZATION N/A 01/06/2016   Procedure: Left Heart Cath and Coronary Angiography;  Surgeon: Iran Ouch, MD;  Location: [facility] INVASIVE CV LAB;  Service: Cardiovascular;  Laterality: N/A;   CESAREAN SECTION  08/2014   I & D EXTREMITY Right 06/18/2022   Procedure: IRRIGATION AND DEBRIDEMENT RIGHT ABDOMEN AND THIGH;  Surgeon: Kinsinger, De Blanch, MD;  Location: [facility] OR;  Service: General;  Laterality: Right;   I & D EXTREMITY Right 06/20/2022   Procedure: WOUND EXPLORATION OF RIGHT THIGH AND RIGHT GROIN WITH IRRIGATION AND DEBRIDEMENT;  Surgeon: Griselda Miner, MD;  Location: [facility] OR;  Service: General;  Laterality: Right;   REDUCTION MAMMAPLASTY Bilateral 03/21/2017    Family Hx:  Family History  Problem Relation Age of Onset   Diabetes Mother    Hypertension Mother    Thyroid disease Mother    Kidney disease Maternal Grandmother    Diabetes Maternal Grandmother    Heart attack Other     Social History:  reports that she has never smoked. She has never used smokeless tobacco. She reports that she does not drink alcohol and does not use drugs.  Allergies: No Known Allergies  Medications: Prior to Admission medications   Medication Sig Start Date End Date Taking? Authorizing Provider  amLODipine (NORVASC) 10 MG tablet Take 1 tablet (10 mg total) by mouth daily. 07/07/22  Yes Angiulli, Mcarthur Rossetti, PA-C  calcitRIOL (ROCALTROL) 0.5 MCG capsule Take 0.5 mcg  by mouth 3 (three) times daily. 10/03/23  Yes [provider]  ciclopirox (PENLAC) 8 % solution Apply 1 Application topically at bedtime. Apply over nail and surrounding skin. Apply daily over previous coat. After seven (7) days, may remove with alcohol and continue cycle.   Yes [provider]  furosemide (LASIX) 40 MG tablet Take 1 tablet (40 mg total) by mouth daily. Patient taking differently: Take 80 mg by mouth daily.  07/07/22  Yes Angiulli, Mcarthur Rossetti, PA-C  gabapentin (NEURONTIN) 300 MG capsule Take 1 capsule (300 mg total) by mouth at bedtime. 07/07/22  Yes Angiulli, Mcarthur Rossetti, PA-C  hydrALAZINE (APRESOLINE) 50 MG tablet Take 50 mg by mouth 3 (three) times daily.   Yes [provider]  Vitamin D, Ergocalciferol, (DRISDOL) 1.25 MG (50000 UNIT) CAPS capsule Take 1 capsule (50,000 Units total) by mouth every Thursday. Patient taking differently: Take 50,000 Units by mouth every 7 (seven) days. Mondays 07/07/22  Yes Angiulli, Mcarthur Rossetti, PA-C  calcitRIOL (ROCALTROL) 0.25 MCG capsule Take 1 capsule (0.25 mcg total) by mouth daily. Patient not taking: Reported on 10/11/2023 07/07/22   Angiulli, Mcarthur Rossetti, PA-C  ondansetron (ZOFRAN-ODT) 4 MG disintegrating tablet Take 1 tablet (4 mg total) by mouth every 8 (eight) hours as needed for nausea or vomiting. Patient not taking: Reported on 10/11/2023 05/09/23   Lonell Grandchild, MD  oxyCODONE-acetaminophen (PERCOCET/ROXICET) 5-325 MG tablet Take 1 tablet by mouth every 6 (six) hours as needed. Patient not taking: Reported on 10/11/2023 05/02/23   Lars Mage, PA-C  sertraline (ZOLOFT) 100 MG tablet Take 1 tablet (100 mg total) by mouth at bedtime. Patient not taking: Reported on 10/11/2023 07/07/22   Angiulli, Mcarthur Rossetti, PA-C  sodium bicarbonate 650 MG tablet Take 2 tablets (1,300 mg total) by mouth 2 (two) times daily. Patient not taking: Reported on 10/11/2023 07/07/22   Angiulli, Mcarthur Rossetti, PA-C     Labs:  Results for orders  placed or performed during the hospital encounter of 10/11/23 (from the past 48 hours)  Resp panel by RT-PCR (RSV, Flu A&B, Covid) Anterior Nasal Swab     Status: None   Collection Time: 10/11/23  8:01 AM   Specimen: Anterior Nasal Swab  Result Value Ref Range   SARS Coronavirus 2 by RT PCR NEGATIVE NEGATIVE    Comment: (NOTE) SARS-CoV-2 target nucleic acids are NOT DETECTED.  The SARS-CoV-2 RNA is generally detectable in upper respiratory specimens during the acute phase of infection. The lowest concentration of SARS-CoV-2 viral copies this assay can detect is 138 copies/mL. A negative result does not preclude SARS-Cov-2 infection and should not be used as the sole basis for treatment or other patient management decisions. A negative result may occur with  improper specimen collection/handling, submission of specimen other than nasopharyngeal swab, presence of viral mutation(s) within the areas targeted by this assay, and inadequate number of viral copies(<138 copies/mL). A negative result must be combined with clinical observations, patient history, and epidemiological information. The expected result is Negative.  Fact Sheet for Patients:  BloggerCourse.com  Fact Sheet for Healthcare Providers:  SeriousBroker.it  This test is no t yet approved or cleared by the [place] FDA and  has been authorized for detection and/or diagnosis of SARS-CoV-2 by FDA under an Emergency Use Authorization (EUA). This EUA will remain  in effect (meaning this test can be used) for the duration of the COVID-19 declaration under Section 564(b)(1) of the Act, 21 U.S.C.section 360bbb-3(b)(1), unless the authorization is terminated  or revoked sooner.       Influenza A by PCR NEGATIVE NEGATIVE   Influenza B by PCR NEGATIVE NEGATIVE    Comment: (NOTE) The Xpert Xpress SARS-CoV-2/FLU/RSV plus assay is intended as an aid in the diagnosis of  influenza from Nasopharyngeal swab specimens and should not be used as a sole basis for treatment. Nasal washings and aspirates are unacceptable for Xpert Xpress SARS-CoV-2/FLU/RSV testing.  Fact Sheet for Patients:  BloggerCourse.com  Fact Sheet for Healthcare Providers: SeriousBroker.it  This test is not yet approved or cleared by the [place] FDA and has been authorized for detection and/or diagnosis of SARS-CoV-2 by FDA under an Emergency Use Authorization (EUA). This EUA will remain in effect (meaning this test can be used) for the duration of the COVID-19 declaration under Section 564(b)(1) of the Act, 21 U.S.C. section 360bbb-3(b)(1), unless the authorization is terminated or revoked.     Resp Syncytial Virus by PCR NEGATIVE NEGATIVE    Comment: (NOTE) Fact Sheet for Patients: BloggerCourse.com  Fact Sheet for Healthcare Providers: SeriousBroker.it  This test is not yet approved or cleared by the [place] FDA and has been authorized for detection and/or diagnosis of SARS-CoV-2 by FDA under an Emergency Use Authorization (EUA). This EUA will remain in effect (meaning this test can be used) for the duration of the COVID-19 declaration under Section 564(b)(1) of the Act, 21 U.S.C. section 360bbb-3(b)(1), unless the authorization is terminated or revoked.  Performed at [facility], [address] [place], [place] [address]   Troponin I (High Sensitivity)     Status: None   Collection Time: 10/11/23  8:01 AM  Result Value Ref Range   Troponin I (High Sensitivity) 10 <18 ng/L    Comment: (NOTE) Elevated high sensitivity troponin I (hsTnI) values and significant  changes across serial measurements may suggest ACS but many other  chronic and acute conditions are known to elevate hsTnI results.  Refer to the "Links" section for chest pain algorithms and  additional  guidance. Performed at [facility], [address] [place], [place] [address]   Pregnancy, urine     Status: None   Collection Time: 10/11/23  8:01 AM  Result Value Ref Range   Preg Test, Ur NEGATIVE NEGATIVE    Comment:        THE SENSITIVITY OF THIS METHODOLOGY IS >25 mIU/mL. Performed at [facility], [address] [place], [place] [address]   Basic metabolic panel     Status: Abnormal   Collection Time: 10/11/23  9:20 AM  Result Value Ref Range   Sodium 140 135 - 145 mmol/L   Potassium 3.7 3.5 - 5.1 mmol/L   Chloride 111 98 - 111 mmol/L   CO2 18 (L) 22 - 32 mmol/L   Glucose, Bld 82 70 - 99 mg/dL    Comment: Glucose reference range applies only to samples taken after fasting for at least 8 hours.   BUN 60 (H) 6 - 20 mg/dL   Creatinine, Ser 9.14 (H) 0.44 - 1.00 mg/dL   Calcium 5.7 (LL) 8.9 - 10.3 mg/dL    Comment: CRITICAL RESULT CALLED TO, READ BACK BY AND VERIFIED WITH A. THOMPSON RN AT 10/11/23 1013 MB   GFR, Estimated 7 (L) >60 mL/min    Comment: (NOTE) Calculated using the CKD-EPI Creatinine Equation (2021)    Anion gap 11 5 - 15    Comment: Performed at [facility], [address] [place], [place] [address]  CBC     Status: Abnormal   Collection Time: 10/11/23  9:20 AM  Result Value Ref Range   WBC 7.0 4.0 - 10.5 K/uL   RBC 2.49 (L) 3.87 - 5.11 MIL/uL   Hemoglobin 7.4 (L) 12.0 - 15.0 g/dL   HCT 62.1 (L) 30.8 - 65.7 %   MCV 90.4 80.0 - 100.0 fL   MCH 29.7 26.0 -  34.0 pg   MCHC 32.9 30.0 - 36.0 g/dL   RDW 09.8 11.9 - 14.7 %   Platelets 215 150 - 400 K/uL   nRBC 0.0 0.0 - 0.2 %    Comment: Performed at [facility], [address], [place], [place] [address]  Brain natriuretic peptide     Status: Abnormal   Collection Time: 10/11/23  9:20 AM  Result Value Ref Range   B Natriuretic Peptide 103.5 (H) 0.0 - 100.0 pg/mL    Comment: Performed at [facility], [address] [place], [place]  [address]  Magnesium     Status: None   Collection Time: 10/11/23  9:20 AM  Result Value Ref Range   Magnesium 1.9 1.7 - 2.4 mg/dL    Comment: Performed at [facility], [address] [place], [place] [address]  Troponin I (High Sensitivity)     Status: None   Collection Time: 10/11/23  3:33 PM  Result Value Ref Range   Troponin I (High Sensitivity) 9 <18 ng/L    Comment: (NOTE) Elevated high sensitivity troponin I (hsTnI) values and significant  changes across serial measurements may suggest ACS but many other  chronic and acute conditions are known to elevate hsTnI results.  Refer to the "Links" section for chest pain algorithms and additional  guidance. Performed at [facility], [address], [place], [place] [address]      ROS:  Pertinent items noted in HPI and remainder of comprehensive ROS otherwise negative.  Physical Exam: Vitals:   10/11/23 1400 10/11/23 1520  BP:  (!) 174/96  Pulse:  95  Resp:  20  Temp: 98.2 F (36.8 C) 98.7 F (37.1 C)  SpO2:  100%     General: NAD, pleasant female HEENT: NCAT Heart: normal rate, normal rhythm, no JVD, or LE edema, S4 gallop present Lungs: CTAB Abdomen: no TTP, normal bowel sounds Extremities:no asymmetry, fistula on LUE with thrill Skin: right thigh with old excision Neuro: alert and oriented x4  Assessment/Plan: 1.CKD V/Hypocalcemia: patient with hx of CKD that presented with muscle pains, malaise and found to have severely low levels of calcium. Magnesium level is normal. Will need IV calcium repletion. Will get further lab work to characterize her calcium loss but likely 2/2 to ESRD who will need HD this admission.Will replete calcium with IV calcium gluconate.  2.Hypertension: Pt is hypertensive today. States she is adherent to her regimen. Will keep regimen same as pt will undergo HD this admission. 3. HFpEF/volume: She is euvolemic on exam.  4. Anemia: Pt's hemoglobin is 7.4. She reports getting  outpatient iron replacement. Will check her levels today. Add iron studies to her lab work. Previous studies show Tsat 34 %.  5. Abnormal heart sound: S4 gallop appreciated. Consider echo and cardiology consult; defer to primary team  Other conditions per primary team  Gwenevere Abbot, MD Eligha Bridegroom. [facility] Internal Medicine Residency, PGY-3   Seen and examined independently.  Agree with note and exam as documented above by resident physician Dr. Welton Flakes and as noted here.  Jacqueline [name] is a 43 year old female with a history of CKD stage V, diabetes mellitus, hypertension, heart failure with preserved EF who presented to the ER with muscle aching and cramping.  Labs were notable for severe hypocalcemia.  She follows with Dr. Ronalee Belts at [facility] She was transferred from an outside ER to [facility] for further management.  She and I  have discussed the risks, benefits, and indications for hemodialysis and she does consent to go ahead and initiate hemodialysis.  She is appropriately tearful at this news and agrees that we are at that point of needing to start.  Note that she had a left upper extremity AV graft placed on 05/02/2023 with Dr. Hetty Blend.  This was assessed by vascular surgery in November and is acceptable for use now per their note.   General adult female in bed in no acute distress HEENT normocephalic atraumatic extraocular movements intact sclera anicteric Neck supple trachea midline Lungs clear to auscultation bilaterally normal work of breathing at rest  Heart S1S2 no rub  Abdomen soft nontender nondistended Extremities no edema  Psych normal mood and affect Neuro - alert and oriented x 3 provides hx and follows commands  Access LUE AV graft with bruit and thrill   # Hypocalcemia - Calcium IV ordered - Starting hemodialysis tonight - Continue calcitriol at current dose for now - Update intact PTH - Per nursing, we are limited inpatient to 2.5 calcium bath right now  #  CKD stage V with progression to ESRD - HD overnight.  Plan for serial HD as able per staffing -Will need to discuss clip with renal navigator in AM  # Hypertension -Follow with dialysis  # Heart failure with preserved EF  -Near euvolemic on exam - Optimize volume with dialysis  # Metabolic acidosis -Starting hemodialysis  # Anemia of CKD -Will need to discuss ESA and will update iron panel - She is a little overwhelmed and we will discuss this again tomorrow  # Hyperparathyroidism - Update intact PTH - Appreciate Dr. Park Breed.  Per fill history, it does appear that the patient is taking calcitriol 0.5 mcg TID  Thank you for the consult.  Please do not hesitate to contact me with any questions regarding our patient  Estanislado Emms, MD 10/11/2023  7:43 PM  Corrected spelling of patient's name due to dictation error  Estanislado Emms, MD 10/11/2023  7:44 PM

## 2023-10-11 NOTE — Plan of Care (Signed)

## 2023-10-12 ENCOUNTER — Inpatient Hospital Stay (HOSPITAL_COMMUNITY)

## 2023-10-12 ENCOUNTER — Encounter (HOSPITAL_COMMUNITY): Payer: Self-pay

## 2023-10-12 DIAGNOSIS — R609 Edema, unspecified: Secondary | ICD-10-CM

## 2023-10-12 DIAGNOSIS — N189 Chronic kidney disease, unspecified: Secondary | ICD-10-CM | POA: Diagnosis not present

## 2023-10-12 DIAGNOSIS — R0609 Other forms of dyspnea: Secondary | ICD-10-CM

## 2023-10-12 LAB — CBC WITH DIFFERENTIAL/PLATELET
Abs Immature Granulocytes: 0.04 10*3/uL (ref 0.00–0.07)
Basophils Absolute: 0 10*3/uL (ref 0.0–0.1)
Basophils Relative: 1 %
Eosinophils Absolute: 0.2 10*3/uL (ref 0.0–0.5)
Eosinophils Relative: 3 %
HCT: 22.1 % — ABNORMAL LOW (ref 36.0–46.0)
Hemoglobin: 7.1 g/dL — ABNORMAL LOW (ref 12.0–15.0)
Immature Granulocytes: 1 %
Lymphocytes Relative: 15 %
Lymphs Abs: 0.9 10*3/uL (ref 0.7–4.0)
MCH: 29.5 pg (ref 26.0–34.0)
MCHC: 32.1 g/dL (ref 30.0–36.0)
MCV: 91.7 fL (ref 80.0–100.0)
Monocytes Absolute: 0.6 10*3/uL (ref 0.1–1.0)
Monocytes Relative: 10 %
Neutro Abs: 4.2 10*3/uL (ref 1.7–7.7)
Neutrophils Relative %: 70 %
Platelets: 210 10*3/uL (ref 150–400)
RBC: 2.41 MIL/uL — ABNORMAL LOW (ref 3.87–5.11)
RDW: 14.2 % (ref 11.5–15.5)
WBC: 6 10*3/uL (ref 4.0–10.5)
nRBC: 0 % (ref 0.0–0.2)

## 2023-10-12 LAB — ECHOCARDIOGRAM COMPLETE
AR max vel: 2.9 cm2
AV Area VTI: 3 cm2
AV Area mean vel: 2.76 cm2
AV Mean grad: 9 mmHg
AV Peak grad: 14.6 mmHg
Ao pk vel: 1.91 m/s
Area-P 1/2: 4.39 cm2
Calc EF: 74.8 %
Height: 64 in
MV VTI: 3.37 cm2
S' Lateral: 1.9 cm
Single Plane A2C EF: 78.8 %
Single Plane A4C EF: 70.3 %
Weight: 2800 [oz_av]

## 2023-10-12 LAB — RENAL FUNCTION PANEL
Albumin: 2.5 g/dL — ABNORMAL LOW (ref 3.5–5.0)
Anion gap: 10 (ref 5–15)
BUN: 36 mg/dL — ABNORMAL HIGH (ref 6–20)
CO2: 21 mmol/L — ABNORMAL LOW (ref 22–32)
Calcium: 6.4 mg/dL — CL (ref 8.9–10.3)
Chloride: 107 mmol/L (ref 98–111)
Creatinine, Ser: 5.11 mg/dL — ABNORMAL HIGH (ref 0.44–1.00)
GFR, Estimated: 10 mL/min — ABNORMAL LOW (ref >60)
Glucose, Bld: 211 mg/dL — ABNORMAL HIGH (ref 70–99)
Phosphorus: 3.5 mg/dL (ref 2.5–4.6)
Potassium: 3.7 mmol/L (ref 3.5–5.1)
Sodium: 138 mmol/L (ref 135–145)

## 2023-10-12 LAB — HEMOGLOBIN A1C
Hgb A1c MFr Bld: 4.4 % — ABNORMAL LOW (ref 4.8–5.6)
Mean Plasma Glucose: 79.58 mg/dL

## 2023-10-12 LAB — GLUCOSE, CAPILLARY: Glucose-Capillary: 199 mg/dL — ABNORMAL HIGH (ref 70–99)

## 2023-10-12 MED ORDER — SODIUM CHLORIDE 0.9 % IV SOLN
12.5000 mg | Freq: Three times a day (TID) | INTRAVENOUS | Status: DC | PRN
  Administered 2023-10-15: 12.5 mg via INTRAVENOUS
  Filled 2023-10-12: qty 12.5
  Filled 2023-10-12 (×2): qty 0.5

## 2023-10-12 MED ORDER — DARBEPOETIN ALFA 60 MCG/0.3ML IJ SOSY
60.0000 ug | PREFILLED_SYRINGE | Freq: Once | INTRAMUSCULAR | Status: DC
  Filled 2023-10-12: qty 0.3

## 2023-10-12 MED ORDER — DARBEPOETIN ALFA 100 MCG/0.5ML IJ SOSY
100.0000 ug | PREFILLED_SYRINGE | INTRAMUSCULAR | Status: DC
Start: 2023-10-12 — End: 2023-10-13
  Filled 2023-10-12: qty 0.5

## 2023-10-12 MED ORDER — CALCIUM GLUCONATE-NACL 2-0.675 GM/100ML-% IV SOLN: 2.0000 g | Freq: Once | INTRAVENOUS | Status: DC

## 2023-10-12 MED ORDER — CALCIUM GLUCONATE-NACL 1-0.675 GM/50ML-% IV SOLN
1.0000 g | Freq: Once | INTRAVENOUS | Status: AC
  Administered 2023-10-12: 1000 mg via INTRAVENOUS
  Filled 2023-10-12: qty 50

## 2023-10-12 MED ORDER — CHLORHEXIDINE GLUCONATE CLOTH 2 % EX PADS
6.0000 | MEDICATED_PAD | Freq: Every day | CUTANEOUS | Status: DC
  Administered 2023-10-12 – 2023-10-13 (×2): 6 via TOPICAL

## 2023-10-12 NOTE — Progress Notes (Signed)
 Received patient in bed to unit.  Alert and oriented x 4 Informed consent signed and in chart.   TX duration: 3.5 hours   Patient tolerated well.  Transported back to the room  Alert, without acute distress.  Hand-off given to patient's nurse.  Amy D. RN   Access used: Right Fistula  Access issues: None   Total UF removed: -0.1 Medication(s) given: Tylenol for temp Post HD weight: 84.0 kg Post HD VS: 180/82, 108 P, 20 Resp, 99 on 2L    Jacqueline [name] S [facility]

## 2023-10-12 NOTE — Progress Notes (Signed)
 Lower extremity venous duplex completed. Please see CV Procedures for preliminary results.  Shona Simpson, RVT 10/12/23 9:58 AM

## 2023-10-12 NOTE — Progress Notes (Signed)
   10/12/23 0000  Vitals  BP (!) 144/70  MAP (mmHg) 88  BP Location Right Arm  BP Method Automatic  Patient Position (if appropriate) Lying  Pulse Rate 89  Pulse Rate Source Monitor  ECG Heart Rate 91  Resp (!) 9  Oxygen Therapy  SpO2 100 %  O2 Device Nasal Cannula  O2 Flow Rate (L/min) 2 L/min  During Treatment Monitoring  Blood Flow Rate (mL/min) 169 mL/min  Arterial Pressure (mmHg) -56.96 mmHg  Venous Pressure (mmHg) 361.4 mmHg  TMP (mmHg) 25.65 mmHg  Ultrafiltration Rate (mL/min) 203 mL/min  Dialysate Flow Rate (mL/min) 299 ml/min  Dialysate Potassium Concentration 3  Dialysate Calcium Concentration 2.5  Duration of HD Treatment -hour(s) 2 hour(s)  Cumulative Fluid Removed (mL) per Treatment  -117.75  HD Safety Checks Performed Yes  Intra-Hemodialysis Comments Tx completed  Dialysis Fluid Bolus Normal Saline  Bolus Amount (mL) 150 mL  Post Treatment  Dialyzer Clearance Lightly streaked  Liters Processed 23.9  Fluid Removed (mL) 0 mL  Tolerated HD Treatment Yes  Post-Hemodialysis Comments  (clotted venous chamber, blood return)  AVG/AVF Arterial Site Held (minutes) 7 minutes  AVG/AVF Venous Site Held (minutes) 8 minutes  Note  Patient Observations  (Pt stable no complaints)   Pt tolerated. 150 saline given mid treatment due to cramps. Clots in venous chamber at the end blood return safely.

## 2023-10-12 NOTE — Plan of Care (Signed)
  Problem: Health Behavior/Discharge Planning: Goal: Ability to manage health-related needs will improve Outcome: Progressing   Problem: Clinical Measurements: Goal: Ability to maintain clinical measurements within normal limits will improve Outcome: Progressing   Problem: Pain Managment: Goal: General experience of comfort will improve and/or be controlled Outcome: Progressing

## 2023-10-12 NOTE — Progress Notes (Signed)
 PROGRESS NOTE  Marda Stalker VHQ:[id number] DOB: 03-13-1981 DOA: 10/11/2023 PCP: [place], MD  HPI/Recap of past 24 hours: Marda Stalker is a 43 y.o. female with medical history significant of HTN, HLD, DM 2, CKD stage V, CHF, presented to [facility] initially, with multiple complaints including generalized weakness, fevers with Tmax of around 102, URI symptoms, nasal congestion, some nausea/vomiting with some loose stools.  Noted her 2 children ages 20 years and 11 years had been sick so she may have contracted something from them.  Above symptoms have been going on for about a week, but continues to worsen now with some shortness of breath/dyspnea with some mild chest discomfort.  Also noted recent muscle cramping with tingling sensations in hands and feet. Prompted patient to go to [facility] ED where workup was done which showed severe hypocalcemia.  Other labs noted creatinine 7.13, BNP 103.5, troponin WNL x 2, hemoglobin 7.4, baseline around 8.  Influenza, RSV and COVID all negative.  Chest x-ray unremarkable.  Vital signs fairly stable.  Due to severe hypocalcemia patient was transferred to [facility] for nephrology evaluation and further workup.  [facility] hospitalist asked to admit patient. Of note, patient had been followed by [facility], had AV graft in her left upper extremity placed on 05/02/2023, acceptable for use.     Today, patient continues to complain of persistent nausea, denies any vomiting, abdominal pain, left-sided chest pain, worsening shortness of breath.  Patient tolerated her first HD session on 10/11/2023.  Reported feeling very emotional about initiating HD but knows she needs it.  Noted to have cried all night, but feels better this morning.    Assessment/Plan: Principal Problem:   Hypocalcemia due to chronic kidney disease   Noted severe hypocalcemia Calcium noted to be 5.7 on admission Nephrology consulted, received  calcium gluconate x 2 Continue calcitriol PTH pending Monitor renal function panel   Possible viral URI Infectious workup Noted sick contacts with kids Noted fevers, Tmax of 102 at home, none since admission COVID/RSV/influenza negative UA/UC ordered and pending collection (patient still makes urine, denies any dysuria)   CKD stage V with progression to ESRD Metabolic acidosis Nephrology on board, initiated HD on 10/11/2023 via AVG CLIP patient to outpatient HD unit Patient still makes urine   Anemia of CKD Hemoglobin 7.4, baseline around 8 Anemia panel with iron 41, TIBC 176, sats 23, ferritin 187 Started ESA Daily CBC   Heart failure with preserved EF Management with dialysis Echo showed EF of 60 to 65%, no regional wall motion abnormality, moderate left ventricular hypertrophy, grade 1 diastolic dysfunction   Hypertension Continue amlodipine, hydralazine   Diabetes mellitus type 2 Repeat A1c 4.4 Not on any meds, diet controlled   History of morbid obesity status post gastric bypass BMI 30, obesity Continue lifestyle modification      Estimated body mass index is 30.04 kg/m as calculated from the following:   Height as of this encounter: 5\' 4"  (1.626 m).   Weight as of this encounter: 79.4 kg.     Code Status: Full  Family Communication: Cousin at bedside  Disposition Plan: Status is: Inpatient Remains inpatient appropriate because: Level of care      Consultants: Nephrology  Procedures: None  Antimicrobials: None  DVT prophylaxis: Heparin   Objective: Vitals:   10/12/23 0039 10/12/23 0523 10/12/23 0525 10/12/23 0825  BP: 131/74 125/75 125/75 (!) 164/91  Pulse:    72  Resp: 18 16  18  Temp: 98.2 F (36.8 C) 97.6 F (36.4 C)  98.5 F (36.9 C)  TempSrc: Oral Oral  Oral  SpO2:  (!) 89% 93% (!) 86%  Weight:      Height:        Intake/Output Summary (Last 24 hours) at 10/12/2023 1446 Last data filed at 10/12/2023 0915 Gross per 24  hour  Intake 360 ml  Output 250 ml  Net 110 ml   Filed Weights   10/11/23 0758  Weight: 79.4 kg    Exam: General: NAD  Cardiovascular: S1, S2 present Respiratory: CTAB Abdomen: Soft, nontender, nondistended, bowel sounds present Musculoskeletal: No bilateral pedal edema noted Skin: Normal Psychiatry: Normal mood    Data Reviewed: CBC: Recent Labs  Lab 10/11/23 0920 10/12/23 0319  WBC 7.0 6.0  NEUTROABS  --  4.2  HGB 7.4* 7.1*  HCT 22.5* 22.1*  MCV 90.4 91.7  PLT 215 210   Basic Metabolic Panel: Recent Labs  Lab 10/11/23 0920 10/11/23 1533 10/11/23 1930 10/12/23 0319  NA 140 140  --  138  K 3.7 3.8  --  3.7  CL 111 114*  --  107  CO2 18* 14*  --  21*  GLUCOSE 82 78  --  211*  BUN 60* 61*  --  36*  CREATININE 7.13* 6.87*  --  5.11*  CALCIUM 5.7* 5.8*  --  6.4*  MG 1.9  --   --   --   PHOS  --   --  4.9* 3.5   GFR: Estimated Creatinine Clearance: 14.5 mL/min (A) (by C-G formula based on SCr of 5.11 mg/dL (H)). Liver Function Tests: Recent Labs  Lab 10/11/23 1533 10/12/23 0319  ALBUMIN 2.8* 2.5*   No results for input(s): "LIPASE", "AMYLASE" in the last 168 hours. No results for input(s): "AMMONIA" in the last 168 hours. Coagulation Profile: No results for input(s): "INR", "PROTIME" in the last 168 hours. Cardiac Enzymes: No results for input(s): "CKTOTAL", "CKMB", "CKMBINDEX", "TROPONINI" in the last 168 hours. BNP (last 3 results) No results for input(s): "PROBNP" in the last 8760 hours. HbA1C: Recent Labs    10/12/23 0319  HGBA1C 4.4*   CBG: No results for input(s): "GLUCAP" in the last 168 hours. Lipid Profile: No results for input(s): "CHOL", "HDL", "LDLCALC", "TRIG", "CHOLHDL", "LDLDIRECT" in the last 72 hours. Thyroid Function Tests: No results for input(s): "TSH", "T4TOTAL", "FREET4", "T3FREE", "THYROIDAB" in the last 72 hours. Anemia Panel: Recent Labs    10/11/23 1930  FERRITIN 187  TIBC 176*  IRON 41   Urine analysis:     Component Value Date/Time   COLORURINE YELLOW 05/09/2023 0759   APPEARANCEUR HAZY (A) 05/09/2023 0759   LABSPEC 1.010 05/09/2023 0759   PHURINE 7.0 05/09/2023 0759   GLUCOSEU 50 (A) 05/09/2023 0759   HGBUR NEGATIVE 05/09/2023 0759   BILIRUBINUR NEGATIVE 05/09/2023 0759   KETONESUR NEGATIVE 05/09/2023 0759   PROTEINUR 100 (A) 05/09/2023 0759   UROBILINOGEN 0.2 08/21/2012 2130   NITRITE NEGATIVE 05/09/2023 0759   LEUKOCYTESUR TRACE (A) 05/09/2023 0759   Sepsis Labs: @LABRCNTIP (procalcitonin:4,lacticidven:4)  ) Recent Results (from the past 240 hours)  Resp panel by RT-PCR (RSV, Flu A&B, Covid) Anterior Nasal Swab     Status: None   Collection Time: 10/11/23  8:01 AM   Specimen: Anterior Nasal Swab  Result Value Ref Range Status   SARS Coronavirus 2 by RT PCR NEGATIVE NEGATIVE Final    Comment: (NOTE) SARS-CoV-2 target nucleic acids are NOT DETECTED.  The SARS-CoV-2 RNA is generally detectable in upper respiratory specimens during the acute phase of infection. The lowest concentration of SARS-CoV-2 viral copies this assay can detect is 138 copies/mL. A negative result does not preclude SARS-Cov-2 infection and should not be used as the sole basis for treatment or other patient management decisions. A negative result may occur with  improper specimen collection/handling, submission of specimen other than nasopharyngeal swab, presence of viral mutation(s) within the areas targeted by this assay, and inadequate number of viral copies(<138 copies/mL). A negative result must be combined with clinical observations, patient history, and epidemiological information. The expected result is Negative.  Fact Sheet for Patients:  BloggerCourse.com  Fact Sheet for Healthcare Providers:  SeriousBroker.it  This test is no t yet approved or cleared by the [place] FDA and  has been authorized for detection and/or diagnosis of  SARS-CoV-2 by FDA under an Emergency Use Authorization (EUA). This EUA will remain  in effect (meaning this test can be used) for the duration of the COVID-19 declaration under Section 564(b)(1) of the Act, 21 U.S.C.section 360bbb-3(b)(1), unless the authorization is terminated  or revoked sooner.       Influenza A by PCR NEGATIVE NEGATIVE Final   Influenza B by PCR NEGATIVE NEGATIVE Final    Comment: (NOTE) The Xpert Xpress SARS-CoV-2/FLU/RSV plus assay is intended as an aid in the diagnosis of influenza from Nasopharyngeal swab specimens and should not be used as a sole basis for treatment. Nasal washings and aspirates are unacceptable for Xpert Xpress SARS-CoV-2/FLU/RSV testing.  Fact Sheet for Patients: BloggerCourse.com  Fact Sheet for Healthcare Providers: SeriousBroker.it  This test is not yet approved or cleared by the [place] FDA and has been authorized for detection and/or diagnosis of SARS-CoV-2 by FDA under an Emergency Use Authorization (EUA). This EUA will remain in effect (meaning this test can be used) for the duration of the COVID-19 declaration under Section 564(b)(1) of the Act, 21 U.S.C. section 360bbb-3(b)(1), unless the authorization is terminated or revoked.     Resp Syncytial Virus by PCR NEGATIVE NEGATIVE Final    Comment: (NOTE) Fact Sheet for Patients: BloggerCourse.com  Fact Sheet for Healthcare Providers: SeriousBroker.it  This test is not yet approved or cleared by the [place] FDA and has been authorized for detection and/or diagnosis of SARS-CoV-2 by FDA under an Emergency Use Authorization (EUA). This EUA will remain in effect (meaning this test can be used) for the duration of the COVID-19 declaration under Section 564(b)(1) of the Act, 21 U.S.C. section 360bbb-3(b)(1), unless the authorization is terminated  or revoked.  Performed at [facility], [address] [place], [place] [address]       Studies: ECHOCARDIOGRAM COMPLETE Result Date: 10/12/2023    ECHOCARDIOGRAM REPORT   Patient Name:   MINDA FAAS Date of Exam: 10/12/2023 Medical Rec #:  [id number]    Height:       64.0 in Accession #:    [id number]   Weight:       175.0 lb Date of Birth:  04-26-1981    BSA:          1.848 m Patient Age:    42 years     BP:           125/75 mmHg Patient Gender: F            HR:           103 bpm. Exam Location:  Inpatient Procedure: 2D Echo,  Cardiac Doppler and Color Doppler (Both Spectral and Color            Flow Doppler were utilized during procedure). Indications:    Dyspnea  History:        Patient has prior history of Echocardiogram examinations, most                 recent 07/05/2019. CHF, Arrythmias:Atrial Fibrillation; Risk                 Factors:Diabetes.  Sonographer:    Amy Chionchio Referring Phys: [id number] Monica Martinez Nary Sneed IMPRESSIONS  1. Left ventricular ejection fraction, by estimation, is 60 to 65%. The left ventricle has normal function. The left ventricle has no regional wall motion abnormalities. There is moderate left ventricular hypertrophy. Left ventricular diastolic parameters are consistent with Grade I diastolic dysfunction (impaired relaxation).  2. Right ventricular systolic function is normal. The right ventricular size is normal.  3. The mitral valve is normal in structure. Trivial mitral valve regurgitation. No evidence of mitral stenosis.  4. The aortic valve is normal in structure. Aortic valve regurgitation is not visualized. No aortic stenosis is present.  5. The inferior vena cava is normal in size with greater than 50% respiratory variability, suggesting right atrial pressure of 3 mmHg. FINDINGS  Left Ventricle: Left ventricular ejection fraction, by estimation, is 60 to 65%. The left ventricle has normal function. The left ventricle has no regional wall motion  abnormalities. The left ventricular internal cavity size was normal in size. There is  moderate left ventricular hypertrophy. Left ventricular diastolic parameters are consistent with Grade I diastolic dysfunction (impaired relaxation). Right Ventricle: The right ventricular size is normal. No increase in right ventricular wall thickness. Right ventricular systolic function is normal. Left Atrium: Left atrial size was normal in size. Right Atrium: Right atrial size was normal in size. Pericardium: There is no evidence of pericardial effusion. Mitral Valve: The mitral valve is normal in structure. Trivial mitral valve regurgitation. No evidence of mitral valve stenosis. MV peak gradient, 8.2 mmHg. The mean mitral valve gradient is 4.0 mmHg. Tricuspid Valve: The tricuspid valve is normal in structure. Tricuspid valve regurgitation is not demonstrated. No evidence of tricuspid stenosis. Aortic Valve: The aortic valve is normal in structure. Aortic valve regurgitation is not visualized. No aortic stenosis is present. Aortic valve mean gradient measures 9.0 mmHg. Aortic valve peak gradient measures 14.6 mmHg. Aortic valve area, by VTI measures 3.00 cm. Pulmonic Valve: The pulmonic valve was normal in structure. Pulmonic valve regurgitation is not visualized. No evidence of pulmonic stenosis. Aorta: The aortic root is normal in size and structure. Venous: The inferior vena cava is normal in size with greater than 50% respiratory variability, suggesting right atrial pressure of 3 mmHg. IAS/Shunts: No atrial level shunt detected by color flow Doppler.  LEFT VENTRICLE PLAX 2D LVIDd:         3.40 cm     Diastology LVIDs:         1.90 cm     LV e' medial:    6.09 cm/s LV PW:         1.30 cm     LV E/e' medial:  14.5 LV IVS:        1.40 cm     LV e' lateral:   7.46 cm/s LVOT diam:     2.10 cm     LV E/e' lateral: 11.8 LV SV:         88 LV  SV Index:   48 LVOT Area:     3.46 cm  LV Volumes (MOD) LV vol d, MOD A2C: 68.8 ml LV  vol d, MOD A4C: 99.7 ml LV vol s, MOD A2C: 14.6 ml LV vol s, MOD A4C: 29.6 ml LV SV MOD A2C:     54.2 ml LV SV MOD A4C:     99.7 ml LV SV MOD BP:      64.1 ml RIGHT VENTRICLE          IVC RV Basal diam:  2.50 cm  IVC diam: 1.50 cm TAPSE (M-mode): 2.3 cm LEFT ATRIUM             Index        RIGHT ATRIUM           Index LA Vol (A2C):   39.1 ml 21.15 ml/m  RA Area:     12.40 cm LA Vol (A4C):   54.5 ml 29.48 ml/m  RA Volume:   25.90 ml  14.01 ml/m LA Biplane Vol: 48.1 ml 26.02 ml/m  AORTIC VALVE                     PULMONIC VALVE AV Area (Vmax):    2.90 cm      PV Vmax:       1.27 m/s AV Area (Vmean):   2.76 cm      PV Peak grad:  6.5 mmHg AV Area (VTI):     3.00 cm AV Vmax:           191.00 cm/s AV Vmean:          147.000 cm/s AV VTI:            0.293 m AV Peak Grad:      14.6 mmHg AV Mean Grad:      9.0 mmHg LVOT Vmax:         160.00 cm/s LVOT Vmean:        117.000 cm/s LVOT VTI:          0.254 m LVOT/AV VTI ratio: 0.87  AORTA Ao Root diam: 3.30 cm Ao Asc diam:  3.50 cm MITRAL VALVE MV Area (PHT): 4.39 cm     SHUNTS MV Area VTI:   3.37 cm     Systemic VTI:  0.25 m MV Peak grad:  8.2 mmHg     Systemic Diam: 2.10 cm MV Mean grad:  4.0 mmHg MV Vmax:       1.43 m/s MV Vmean:      98.8 cm/s MV Decel Time: 173 msec MV E velocity: 88.10 cm/s MV A velocity: 104.00 cm/s MV E/A ratio:  0.85 Donato Schultz MD Electronically signed by Donato Schultz MD Signature Date/Time: 10/12/2023/2:32:28 PM    Final    VAS [place] LOWER EXTREMITY VENOUS (DVT) Result Date: 10/12/2023  Lower Venous DVT Study Patient Name:  CLEVA CAMERO  Date of Exam:   10/12/2023 Medical Rec #: [id number]     Accession #:    [id number] Date of Birth: June 25, 1981     Patient Gender: F Patient Age:   12 years Exam Location:  [facility] Procedure:      VAS [place] LOWER EXTREMITY VENOUS (DVT) Referring Phys: [facility] Aleea Hendry --------------------------------------------------------------------------------  Indications: Edema.  Risk Factors: Past pregnancy.  Comparison Study: No significant changes seen since the previous exam 08/31/22. Performing Technologist: Shona Simpson  Examination Guidelines: A complete evaluation includes B-mode imaging, spectral Doppler, color Doppler, and power Doppler  as needed of all accessible portions of each vessel. Bilateral testing is considered an integral part of a complete examination. Limited examinations for reoccurring indications may be performed as noted. The reflux portion of the exam is performed with the patient in reverse Trendelenburg.  +---------+---------------+---------+-----------+----------+--------------+ RIGHT    CompressibilityPhasicitySpontaneityPropertiesThrombus Aging +---------+---------------+---------+-----------+----------+--------------+ CFV      Full           Yes      Yes                                 +---------+---------------+---------+-----------+----------+--------------+ SFJ      Full                                                        +---------+---------------+---------+-----------+----------+--------------+ FV Prox  Full                                                        +---------+---------------+---------+-----------+----------+--------------+ FV Mid   Full                                                        +---------+---------------+---------+-----------+----------+--------------+ FV DistalFull                    Yes                                 +---------+---------------+---------+-----------+----------+--------------+ PFV      Full                                                        +---------+---------------+---------+-----------+----------+--------------+ POP      Full           Yes      Yes                                 +---------+---------------+---------+-----------+----------+--------------+ PTV      Full                                                         +---------+---------------+---------+-----------+----------+--------------+ PERO     Full                                                        +---------+---------------+---------+-----------+----------+--------------+   +---------+---------------+---------+-----------+----------+--------------+ LEFT  CompressibilityPhasicitySpontaneityPropertiesThrombus Aging +---------+---------------+---------+-----------+----------+--------------+ CFV      Full           Yes      Yes                                 +---------+---------------+---------+-----------+----------+--------------+ SFJ      Full                                                        +---------+---------------+---------+-----------+----------+--------------+ FV Prox  Full                                                        +---------+---------------+---------+-----------+----------+--------------+ FV Mid   Full                                                        +---------+---------------+---------+-----------+----------+--------------+ FV DistalFull                                                        +---------+---------------+---------+-----------+----------+--------------+ PFV      Full                                                        +---------+---------------+---------+-----------+----------+--------------+ POP      Full           Yes      Yes                                 +---------+---------------+---------+-----------+----------+--------------+ PTV      Full                                                        +---------+---------------+---------+-----------+----------+--------------+ PERO     Full                                                        +---------+---------------+---------+-----------+----------+--------------+     Summary: BILATERAL: - No evidence of deep vein thrombosis seen in the lower extremities, bilaterally. -No evidence of  popliteal cyst, bilaterally.   *See table(s) above for measurements and observations.    Preliminary     Scheduled Meds:  amLODipine  10 mg Oral Daily  calcitRIOL  0.5 mcg Oral TID   Chlorhexidine Gluconate Cloth  6 each Topical Q0600   Chlorhexidine Gluconate Cloth  6 each Topical Q0600   darbepoetin (ARANESP) injection - NON-DIALYSIS  100 mcg Subcutaneous Q Thu-1800   gabapentin  300 mg Oral QHS   heparin  5,000 Units Subcutaneous Q8H   hydrALAZINE  50 mg Oral TID    Continuous Infusions:  promethazine (PHENERGAN) injection (IM or IVPB)       LOS: 1 day     Briant Cedar, MD [facility]  If 7PM-7AM, please contact night-coverage [email or web address] 10/12/2023, 2:46 PM

## 2023-10-12 NOTE — Progress Notes (Signed)
 Persistent hypocalcemia secondary to end-stage renal disease - Corrected calcium with low albumin is 7.4.  Replating with 1 g of calcium gluconate.  Normal vitamin D level, pending PTH and elevated Phos level.  End-stage renal disease progression to ESRD and patient has been started on dialysis 3/26.  Tereasa Coop, MD [facility] 10/12/2023, 4:30 AM

## 2023-10-12 NOTE — Progress Notes (Addendum)
 [facility] Renal Consultation Note  Requesting MD: Dr. Sharolyn Douglas Indication for Consultation: Severe hypocalcemia  HPI:  Jacqueline [name] is a 43 y.o. female with medical hx of HTN, HLD, DMII, CKD V, HFpEF who is presenting to the ED with multiple symptoms including chest pain, muscle pains, malaise and fevers. She presented to [facility] and was transferred here for admission as her calcium was critically low. We have been consulted on for her renal dysfunction and low calcium. She has a hx of hypocalcemia and I suspect this is secondary to her CKD. She is on calcitriol and vitamin D supplementation. She is also on lasix which can deplete calcium levels. On exam, she is very pleasant. Her symptoms have been going on for weeks and states since her fevers were not resolving she presented to the ED.   Subjective: Pt reports she underwent HD last night. She tolerated it well except she had nausea during HD. She states her nausea has persisted and she is unable to tolerate PO zofran.   Creatinine, Ser  Date/Time Value Ref Range Status  10/12/2023 03:19 AM 5.11 (H) 0.44 - 1.00 mg/dL Final  40/98/1191 47:82 PM 6.87 (H) 0.44 - 1.00 mg/dL Final  95/62/1308 65:78 AM 7.13 (H) 0.44 - 1.00 mg/dL Final  46/96/2952 84:13 AM 6.05 (H) 0.44 - 1.00 mg/dL Final  24/40/1027 25:36 AM 6.20 (H) 0.44 - 1.00 mg/dL Final  64/40/3474 25:95 AM 4.30 (H) 0.44 - 1.00 mg/dL Final  63/87/5643 32:95 AM 4.19 (H) 0.44 - 1.00 mg/dL Final  18/84/1660 63:01 AM 4.32 (H) 0.44 - 1.00 mg/dL Final  60/04/9322 55:73 PM 4.55 (H) 0.44 - 1.00 mg/dL Final  22/08/5425 06:23 AM 5.09 (H) 0.44 - 1.00 mg/dL Final  76/28/3151 76:16 AM 4.94 (H) 0.44 - 1.00 mg/dL Final  07/37/1062 69:48 AM 5.03 (H) 0.44 - 1.00 mg/dL Final  54/62/7035 00:93 AM 4.78 (H) 0.44 - 1.00 mg/dL Final  81/82/9937 16:96 PM 4.87 (H) 0.44 - 1.00 mg/dL Final  78/93/8101 75:10 AM 4.81 (H) 0.44 - 1.00 mg/dL Final  25/85/2778 24:23 AM 4.81 (H) 0.44 - 1.00  mg/dL Final  53/61/4431 54:00 AM 5.37 (H) 0.44 - 1.00 mg/dL Final  86/76/1950 93:26 AM 4.99 (H) 0.44 - 1.00 mg/dL Final  71/24/5809 98:33 AM 5.22 (H) 0.44 - 1.00 mg/dL Final  82/50/5397 67:34 AM 5.50 (H) 0.44 - 1.00 mg/dL Final  19/37/9024 09:73 AM 5.77 (H) 0.44 - 1.00 mg/dL Final  53/29/9242 68:34 AM 6.50 (H) 0.44 - 1.00 mg/dL Final  19/62/2297 98:92 AM 6.92 (H) 0.44 - 1.00 mg/dL Final  11/94/1740 81:44 AM 6.52 (H) 0.44 - 1.00 mg/dL Final  81/85/6314 97:02 AM 6.62 (H) 0.44 - 1.00 mg/dL Final  63/78/5885 02:77 AM 6.03 (H) 0.44 - 1.00 mg/dL Final  41/28/7867 67:20 PM 5.86 (H) 0.44 - 1.00 mg/dL Final  94/70/9628 36:62 AM 5.55 (H) 0.44 - 1.00 mg/dL Final  94/76/5465 03:54 PM 5.53 (H) 0.44 - 1.00 mg/dL Final  65/68/1275 17:00 AM 5.20 (H) 0.44 - 1.00 mg/dL Final  17/49/4496 75:91 AM 5.33 (H) 0.44 - 1.00 mg/dL Final  63/84/6659 93:57 PM 5.15 (H) 0.44 - 1.00 mg/dL Final  01/77/9390 30:09 AM 5.57 (H) 0.44 - 1.00 mg/dL Final  23/30/0762 26:33 PM 4.10 (H) 0.44 - 1.00 mg/dL Final  35/45/6256 38:93 PM 4.21 (H) 0.44 - 1.00 mg/dL Final  73/42/8768 11:57 AM 3.54 (H) 0.44 - 1.00 mg/dL Final  26/20/3559 74:16 AM 3.24 (H) 0.44 - 1.00 mg/dL Final  38/45/3646 80:32 AM  2.94 (H) 0.44 - 1.00 mg/dL Final  91/47/8295 62:13 AM 2.97 (H) 0.44 - 1.00 mg/dL Final  08/65/7846 96:29 AM 3.14 (H) 0.44 - 1.00 mg/dL Final  52/84/1324 40:10 AM 2.25 (H) 0.44 - 1.00 mg/dL Final  27/25/3664 40:34 AM 2.14 (H) 0.44 - 1.00 mg/dL Final  74/25/9563 87:56 AM 2.18 (H) 0.44 - 1.00 mg/dL Final  43/32/9518 84:16 AM 1.94 (H) 0.44 - 1.00 mg/dL Final  60/63/0160 10:93 AM 2.25 (H) 0.44 - 1.00 mg/dL Final  23/55/7322 02:54 AM 2.08 (H) 0.44 - 1.00 mg/dL Final  27/12/2374 28:31 AM 1.89 (H) 0.44 - 1.00 mg/dL Final  51/76/1607 37:10 PM 1.78 (H) 0.44 - 1.00 mg/dL Final  62/69/4854 62:70 AM 2.09 (H) 0.44 - 1.00 mg/dL Final  35/00/9381 82:99 AM 2.82 (H) 0.44 - 1.00 mg/dL Final  37/16/9678 93:81 AM 2.02 (H) 0.44 - 1.00 mg/dL Final  01/75/1025  85:27 AM 2.18 (H) 0.44 - 1.00 mg/dL Final     PMHx:   Past Medical History:  Diagnosis Date   Anemia    Anxiety    Chronic bronchitis (HCC)    Chronic kidney disease    Hypertension    Increased frequency of headaches    Morbid obesity (HCC)    Necrotizing fasciitis (HCC)    Sleep apnea    does not use CPAP - - gastric bypass   Type II diabetes mellitus (HCC)    no meds, diet controlled    Past Surgical History:  Procedure Laterality Date   AV FISTULA PLACEMENT Left 05/02/2023   Procedure: LEFT ARM ARTERIOVENOUS (AV) FISTULA  GRAFT;  Surgeon: Daria Pastures, MD;  Location: [facility] OR;  Service: Vascular;  Laterality: Left;   BARIATRIC SURGERY     BREAST REDUCTION SURGERY  03/21/2017   CARDIAC CATHETERIZATION  01/06/2016   CARDIAC CATHETERIZATION N/A 01/06/2016   Procedure: Left Heart Cath and Coronary Angiography;  Surgeon: Iran Ouch, MD;  Location: [facility] INVASIVE CV LAB;  Service: Cardiovascular;  Laterality: N/A;   CESAREAN SECTION  08/2014   I & D EXTREMITY Right 06/18/2022   Procedure: IRRIGATION AND DEBRIDEMENT RIGHT ABDOMEN AND THIGH;  Surgeon: Kinsinger, De Blanch, MD;  Location: [facility] OR;  Service: General;  Laterality: Right;   I & D EXTREMITY Right 06/20/2022   Procedure: WOUND EXPLORATION OF RIGHT THIGH AND RIGHT GROIN WITH IRRIGATION AND DEBRIDEMENT;  Surgeon: Griselda Miner, MD;  Location: [facility] OR;  Service: General;  Laterality: Right;   REDUCTION MAMMAPLASTY Bilateral 03/21/2017    Family Hx:  Family History  Problem Relation Age of Onset   Diabetes Mother    Hypertension Mother    Thyroid disease Mother    Kidney disease Maternal Grandmother    Diabetes Maternal Grandmother    Heart attack Other     Social History:  reports that she has never smoked. She has never used smokeless tobacco. She reports that she does not drink alcohol and does not use drugs.  Allergies: No Known Allergies  Medications: Prior to Admission medications   Medication Sig  Start Date End Date Taking? Authorizing Provider  amLODipine (NORVASC) 10 MG tablet Take 1 tablet (10 mg total) by mouth daily. 07/07/22  Yes Angiulli, Mcarthur Rossetti, PA-C  calcitRIOL (ROCALTROL) 0.5 MCG capsule Take 0.5 mcg by mouth 3 (three) times daily. 10/03/23  Yes [provider]  ciclopirox (PENLAC) 8 % solution Apply 1 Application topically at bedtime. Apply over nail and surrounding skin. Apply daily over previous coat. After seven (7) days,  may remove with alcohol and continue cycle.   Yes [provider]  furosemide (LASIX) 40 MG tablet Take 1 tablet (40 mg total) by mouth daily. Patient taking differently: Take 80 mg by mouth daily. 07/07/22  Yes Angiulli, Mcarthur Rossetti, PA-C  gabapentin (NEURONTIN) 300 MG capsule Take 1 capsule (300 mg total) by mouth at bedtime. 07/07/22  Yes Angiulli, Mcarthur Rossetti, PA-C  hydrALAZINE (APRESOLINE) 50 MG tablet Take 50 mg by mouth 3 (three) times daily.   Yes [provider]  Vitamin D, Ergocalciferol, (DRISDOL) 1.25 MG (50000 UNIT) CAPS capsule Take 1 capsule (50,000 Units total) by mouth every Thursday. Patient taking differently: Take 50,000 Units by mouth every 7 (seven) days. Mondays 07/07/22  Yes Angiulli, Mcarthur Rossetti, PA-C  calcitRIOL (ROCALTROL) 0.25 MCG capsule Take 1 capsule (0.25 mcg total) by mouth daily. Patient not taking: Reported on 10/11/2023 07/07/22   Angiulli, Mcarthur Rossetti, PA-C  ondansetron (ZOFRAN-ODT) 4 MG disintegrating tablet Take 1 tablet (4 mg total) by mouth every 8 (eight) hours as needed for nausea or vomiting. Patient not taking: Reported on 10/11/2023 05/09/23   Lonell Grandchild, MD  oxyCODONE-acetaminophen (PERCOCET/ROXICET) 5-325 MG tablet Take 1 tablet by mouth every 6 (six) hours as needed. Patient not taking: Reported on 10/11/2023 05/02/23   Lars Mage, PA-C  sertraline (ZOLOFT) 100 MG tablet Take 1 tablet (100 mg total) by mouth at bedtime. Patient not taking: Reported on 10/11/2023 07/07/22   Angiulli,  Mcarthur Rossetti, PA-C  sodium bicarbonate 650 MG tablet Take 2 tablets (1,300 mg total) by mouth 2 (two) times daily. Patient not taking: Reported on 10/11/2023 07/07/22   Angiulli, Mcarthur Rossetti, PA-C     Labs:  Results for orders placed or performed during the hospital encounter of 10/11/23 (from the past 48 hours)  Resp panel by RT-PCR (RSV, Flu A&B, Covid) Anterior Nasal Swab     Status: None   Collection Time: 10/11/23  8:01 AM   Specimen: Anterior Nasal Swab  Result Value Ref Range   SARS Coronavirus 2 by RT PCR NEGATIVE NEGATIVE    Comment: (NOTE) SARS-CoV-2 target nucleic acids are NOT DETECTED.  The SARS-CoV-2 RNA is generally detectable in upper respiratory specimens during the acute phase of infection. The lowest concentration of SARS-CoV-2 viral copies this assay can detect is 138 copies/mL. A negative result does not preclude SARS-Cov-2 infection and should not be used as the sole basis for treatment or other patient management decisions. A negative result may occur with  improper specimen collection/handling, submission of specimen other than nasopharyngeal swab, presence of viral mutation(s) within the areas targeted by this assay, and inadequate number of viral copies(<138 copies/mL). A negative result must be combined with clinical observations, patient history, and epidemiological information. The expected result is Negative.  Fact Sheet for Patients:  BloggerCourse.com  Fact Sheet for Healthcare Providers:  SeriousBroker.it  This test is no t yet approved or cleared by the [place] FDA and  has been authorized for detection and/or diagnosis of SARS-CoV-2 by FDA under an Emergency Use Authorization (EUA). This EUA will remain  in effect (meaning this test can be used) for the duration of the COVID-19 declaration under Section 564(b)(1) of the Act, 21 U.S.C.section 360bbb-3(b)(1), unless the authorization is terminated   or revoked sooner.       Influenza A by PCR NEGATIVE NEGATIVE   Influenza B by PCR NEGATIVE NEGATIVE    Comment: (NOTE) The Xpert Xpress SARS-CoV-2/FLU/RSV plus assay is intended as an  aid in the diagnosis of influenza from Nasopharyngeal swab specimens and should not be used as a sole basis for treatment. Nasal washings and aspirates are unacceptable for Xpert Xpress SARS-CoV-2/FLU/RSV testing.  Fact Sheet for Patients: BloggerCourse.com  Fact Sheet for Healthcare Providers: SeriousBroker.it  This test is not yet approved or cleared by the [place] FDA and has been authorized for detection and/or diagnosis of SARS-CoV-2 by FDA under an Emergency Use Authorization (EUA). This EUA will remain in effect (meaning this test can be used) for the duration of the COVID-19 declaration under Section 564(b)(1) of the Act, 21 U.S.C. section 360bbb-3(b)(1), unless the authorization is terminated or revoked.     Resp Syncytial Virus by PCR NEGATIVE NEGATIVE    Comment: (NOTE) Fact Sheet for Patients: BloggerCourse.com  Fact Sheet for Healthcare Providers: SeriousBroker.it  This test is not yet approved or cleared by the [place] FDA and has been authorized for detection and/or diagnosis of SARS-CoV-2 by FDA under an Emergency Use Authorization (EUA). This EUA will remain in effect (meaning this test can be used) for the duration of the COVID-19 declaration under Section 564(b)(1) of the Act, 21 U.S.C. section 360bbb-3(b)(1), unless the authorization is terminated or revoked.  Performed at [facility], [address] [place] [address] [place], [place] [address]   Troponin I (High Sensitivity)     Status: None   Collection Time: 10/11/23  8:01 AM  Result Value Ref Range   Troponin I (High Sensitivity) 10 <18 ng/L    Comment: (NOTE) Elevated high sensitivity troponin I (hsTnI)  values and significant  changes across serial measurements may suggest ACS but many other  chronic and acute conditions are known to elevate hsTnI results.  Refer to the "Links" section for chest pain algorithms and additional  guidance. Performed at [facility], [address] [place], [place] [address]   Pregnancy, urine     Status: None   Collection Time: 10/11/23  8:01 AM  Result Value Ref Range   Preg Test, Ur NEGATIVE NEGATIVE    Comment:        THE SENSITIVITY OF THIS METHODOLOGY IS >25 mIU/mL. Performed at [facility], [address] [place], [place] [address]   Basic metabolic panel     Status: Abnormal   Collection Time: 10/11/23  9:20 AM  Result Value Ref Range   Sodium 140 135 - 145 mmol/L   Potassium 3.7 3.5 - 5.1 mmol/L   Chloride 111 98 - 111 mmol/L   CO2 18 (L) 22 - 32 mmol/L   Glucose, Bld 82 70 - 99 mg/dL    Comment: Glucose reference range applies only to samples taken after fasting for at least 8 hours.   BUN 60 (H) 6 - 20 mg/dL   Creatinine, Ser 4.69 (H) 0.44 - 1.00 mg/dL   Calcium 5.7 (LL) 8.9 - 10.3 mg/dL    Comment: CRITICAL RESULT CALLED TO, READ BACK BY AND VERIFIED WITH A. THOMPSON RN AT 10/11/23 1013 MB   GFR, Estimated 7 (L) >60 mL/min    Comment: (NOTE) Calculated using the CKD-EPI Creatinine Equation (2021)    Anion gap 11 5 - 15    Comment: Performed at [facility], [address] [place], [place] [address]  CBC     Status: Abnormal   Collection Time: 10/11/23  9:20 AM  Result Value Ref Range   WBC 7.0 4.0 - 10.5 K/uL   RBC 2.49 (  L) 3.87 - 5.11 MIL/uL   Hemoglobin 7.4 (L) 12.0 - 15.0 g/dL   HCT 16.1 (L) 09.6 - 04.5 %   MCV 90.4 80.0 - 100.0 fL   MCH 29.7 26.0 - 34.0 pg   MCHC 32.9 30.0 - 36.0 g/dL   RDW 40.9 81.1 - 91.4 %   Platelets 215 150 - 400 K/uL   nRBC 0.0 0.0 - 0.2 %    Comment: Performed at [facility], [address], [place], [place] [address]  Brain natriuretic peptide      Status: Abnormal   Collection Time: 10/11/23  9:20 AM  Result Value Ref Range   B Natriuretic Peptide 103.5 (H) 0.0 - 100.0 pg/mL    Comment: Performed at [facility], [address] [place], [place] [address]  Magnesium     Status: None   Collection Time: 10/11/23  9:20 AM  Result Value Ref Range   Magnesium 1.9 1.7 - 2.4 mg/dL    Comment: Performed at [facility], [address] [place], [place] [address]  Troponin I (High Sensitivity)     Status: None   Collection Time: 10/11/23  3:33 PM  Result Value Ref Range   Troponin I (High Sensitivity) 9 <18 ng/L    Comment: (NOTE) Elevated high sensitivity troponin I (hsTnI) values and significant  changes across serial measurements may suggest ACS but many other  chronic and acute conditions are known to elevate hsTnI results.  Refer to the "Links" section for chest pain algorithms and additional  guidance. Performed at [facility], [address] [place], [place] [address]   Basic metabolic panel     Status: Abnormal   Collection Time: 10/11/23  3:33 PM  Result Value Ref Range   Sodium 140 135 - 145 mmol/L   Potassium 3.8 3.5 - 5.1 mmol/L   Chloride 114 (H) 98 - 111 mmol/L   CO2 14 (L) 22 - 32 mmol/L   Glucose, Bld 78 70 - 99 mg/dL    Comment: Glucose reference range applies only to samples taken after fasting for at least 8 hours.   BUN 61 (H) 6 - 20 mg/dL   Creatinine, Ser 9.52 (H) 0.44 - 1.00 mg/dL   Calcium 5.8 (LL) 8.9 - 10.3 mg/dL    Comment: CRITICAL RESULT CALLED TO, READ BACK BY AND VERIFIED WITH D. Derrell Lolling RN , @1722  , 10/11/23, [place],T.   GFR, Estimated 7 (L) >60 mL/min    Comment: (NOTE) Calculated using the CKD-EPI Creatinine Equation (2021)    Anion gap 12 5 - 15    Comment: Performed at [facility], [address] [place], [place] [address]  Albumin     Status: Abnormal   Collection Time: 10/11/23  3:33 PM  Result Value Ref Range   Albumin 2.8 (L) 3.5 - 5.0 g/dL     Comment: Performed at [facility], [address] [place], [place] [address]  HIV Antibody (routine testing w rflx)     Status: None   Collection Time: 10/11/23  6:48 PM  Result Value Ref Range   HIV Screen 4th Generation wRfx Non Reactive Non Reactive    Comment: Performed at [facility], [address] [place], [place] [address]  Hepatitis B surface antigen     Status: None   Collection Time: 10/11/23  6:48 PM  Result Value Ref Range   Hepatitis B Surface Ag NON  REACTIVE NON REACTIVE    Comment: Performed at [facility], [address], [place], [place] [address]  VITAMIN D 25 Hydroxy (Vit-D Deficiency, Fractures)     Status: None   Collection Time: 10/11/23  7:25 PM  Result Value Ref Range   Vit D, 25-Hydroxy 31.40 30 - 100 ng/mL    Comment: (NOTE) Vitamin D deficiency has been defined by the Institute of Medicine  and an Endocrine Society practice guideline as a level of serum 25-OH  vitamin D less than 20 ng/mL (1,2). The Endocrine Society went on to  further define vitamin D insufficiency as a level between 21 and 29  ng/mL (2).  1. IOM (Institute of Medicine). 2010. Dietary reference intakes for  calcium and D. [place]: [employer]. 2. Holick MF, Binkley Gaines, Bischoff-Ferrari HA, et al. Evaluation,  treatment, and prevention of vitamin D deficiency: an Endocrine  Society clinical practice guideline, JCEM. 2011 Jul; 96(7): 1911-30.  Performed at [facility], [address] [place], [place] [address]   Iron and TIBC     Status: Abnormal   Collection Time: 10/11/23  7:30 PM  Result Value Ref Range   Iron 41 28 - 170 ug/dL   TIBC 621 (L) 308 - 657 ug/dL   Saturation Ratios 23 10.4 - 31.8 %   UIBC 135 ug/dL    Comment: Performed at [facility], [address] [place], [place] [address]  Ferritin     Status: None   Collection Time: 10/11/23  7:30 PM  Result Value Ref Range   Ferritin 187 11 - 307 ng/mL     Comment: Performed at [facility], [address] [place], [place] [address]  Phosphorus     Status: Abnormal   Collection Time: 10/11/23  7:30 PM  Result Value Ref Range   Phosphorus 4.9 (H) 2.5 - 4.6 mg/dL    Comment: Performed at [facility], [address], [place], [place] [address]  CBC with Differential/Platelet     Status: Abnormal   Collection Time: 10/12/23  3:19 AM  Result Value Ref Range   WBC 6.0 4.0 - 10.5 K/uL   RBC 2.41 (L) 3.87 - 5.11 MIL/uL   Hemoglobin 7.1 (L) 12.0 - 15.0 g/dL   HCT 40.1 (L) 02.7 - 25.3 %   MCV 91.7 80.0 - 100.0 fL   MCH 29.5 26.0 - 34.0 pg   MCHC 32.1 30.0 - 36.0 g/dL   RDW 66.4 40.3 - 47.4 %   Platelets 210 150 - 400 K/uL   nRBC 0.0 0.0 - 0.2 %   Neutrophils Relative % 70 %   Neutro Abs 4.2 1.7 - 7.7 K/uL   Lymphocytes Relative 15 %   Lymphs Abs 0.9 0.7 - 4.0 K/uL   Monocytes Relative 10 %   Monocytes Absolute 0.6 0.1 - 1.0 K/uL   Eosinophils Relative 3 %   Eosinophils Absolute 0.2 0.0 - 0.5 K/uL   Basophils Relative 1 %   Basophils Absolute 0.0 0.0 - 0.1 K/uL   Immature Granulocytes 1 %   Abs Immature Granulocytes 0.04 0.00 - 0.07 K/uL    Comment: Performed at [facility], [address] [place], [place] [address]  Renal function panel     Status: Abnormal   Collection Time: 10/12/23  3:19 AM  Result Value Ref Range   Sodium 138 135 - 145 mmol/L   Potassium 3.7 3.5 - 5.1  mmol/L   Chloride 107 98 - 111 mmol/L   CO2 21 (L) 22 - 32 mmol/L   Glucose, Bld 211 (H) 70 - 99 mg/dL    Comment: Glucose reference range applies only to samples taken after fasting for at least 8 hours.   BUN 36 (H) 6 - 20 mg/dL   Creatinine, Ser 3.66 (H) 0.44 - 1.00 mg/dL   Calcium 6.4 (LL) 8.9 - 10.3 mg/dL    Comment: CRITICAL RESULT CALLED TO, READ BACK BY AND VERIFIED WITH K.CHARLOTTE, RN @0421  03.27.25 AALTOM   Phosphorus 3.5 2.5 - 4.6 mg/dL   Albumin 2.5 (L) 3.5 - 5.0 g/dL   GFR, Estimated 10 (L) >60 mL/min    Comment:  (NOTE) Calculated using the CKD-EPI Creatinine Equation (2021)    Anion gap 10 5 - 15    Comment: Performed at [facility], [address] [place], [place] [address]  Hemoglobin A1c     Status: Abnormal   Collection Time: 10/12/23  3:19 AM  Result Value Ref Range   Hgb A1c MFr Bld 4.4 (L) 4.8 - 5.6 %    Comment: (NOTE) Pre diabetes:          5.7%-6.4%  Diabetes:              >6.4%  Glycemic control for   <7.0% adults with diabetes    Mean Plasma Glucose 79.58 mg/dL    Comment: Performed at [facility], [address], [place], [place] [address]     ROS:  Pertinent items noted in HPI and remainder of comprehensive ROS otherwise negative.  Physical Exam: Vitals:   10/12/23 0523 10/12/23 0525  BP: 125/75 125/75  Pulse:    Resp: 16   Temp: 97.6 F (36.4 C)   SpO2: (!) 89% 93%     General: NAD, pleasant female HEENT: NCAT Heart: normal rate, normal rhythm, no JVD, or LE edema, S4 gallop present Lungs: CTAB Abdomen: no TTP, normal bowel sounds Extremities:no asymmetry, fistula on LUE with thrill Skin: right thigh with old excision Neuro: alert and oriented x4  Assessment/Plan: 1.CKD V/Hypocalcemia: Patient with hx of CKD that presented with muscle pains, malaise and found to have severely low levels of calcium. Magnesium level is normal. Suspect her hypocalcemia is secondary to ESRD and she needs HD. One session of HD overnight 03/26. Hypocalcemia improving post HD. PTH pending but likely elevated in setting of longstanding CKD   2.Hypertension: Normotensive today. Currently on amlodipine 10 mg and hydralazine 50 mg TID. Will adjust regimen as needed with HD.  3. HFpEF/volume: She is euvolemic on exam.   4. Anemia: Pt's hemoglobin is 7.1. She reports getting outpatient iron replacement one month ago. Iron levels show Tsat at 23 %. Ferritin 187. Both are below goal.   5. Abnormal heart sound: S4 gallop appreciated. Echo scheduled at noon. Defer to primary  team.   Other conditions per primary team  Gwenevere Abbot, MD Eligha Bridegroom. [facility] Internal Medicine Residency, PGY-3   Seen and examined independently.  Agree with note and exam as documented above by resident physician and as noted here.  First HD yesterday.  States her hands and extremities are not tingling anymore.  She had nausea with HD but then states that this had been going on for a few days prior as well.  No UF attempted with HD and none ordered today.  She has previously gotten retacrit outpatient   General adult female in bed  in no acute distress HEENT normocephalic atraumatic extraocular movements intact sclera anicteric Neck supple trachea midline Lungs clear to auscultation bilaterally normal work of breathing at rest on room air Heart S1S2 no rub  Abdomen soft nontender nondistended Extremities no edema  Psych normal mood and affect Neuro - alert and oriented x 3 provides hx and follows commands  Access LUE AV graft with bruit and thrill    # Hypocalcemia - s/p Calcium IV  - HD again today  - Continue calcitriol at current dose (home dose) for now then as hypocalcemia improves would be able to consolidate to dosing on HD days hopefully  - Update intact PTH - Per nursing, we are limited inpatient to 2.5 calcium bath right now   # CKD stage V with progression to ESRD - First HD on 3/26 via her AVG  - HD again today then plan for HD on Saturday, 3/29 - I have requested that HD SW CLIP the patient to outpatient HD unit    # Hypertension -Follow with dialysis - continue other current regimen    # Heart failure with preserved EF  -Near euvolemic on exam   # Metabolic acidosis - we have started hemodialysis   # Anemia of CKD - Start back ESA - aranesp 100 mcg every Thursday while here first dose today   # Hyperparathyroidism - intact PTH pending - Appreciate Dr. Park Breed.  Per fill history, it does appear that the patient is taking calcitriol 0.5 mcg  TID  Estanislado Emms, MD 10/12/2023  2:34 PM

## 2023-10-12 NOTE — Progress Notes (Signed)
 Requested to see pt for out-pt HD needs at d/c. Met with pt at bedside. Introduced self and explained role. Discussed out-pt HD options. Pt prefers to stay with [facility] provider and closest to [facility]. Referral submitted to [facility] admissions for review. Pt drives and states family can assist with transportation if needed. Will assist as needed.   Olivia Canter Renal Navigator [phone]

## 2023-10-12 NOTE — TOC Initial Note (Signed)
 Transition of Care [facility]) - Initial/Assessment Note    Patient Details  Name: Jacqueline [name] MRN: [id number] Date of Birth: Oct 08, 1980  Transition of Care [facility]) CM/SW Contact:    Leone Haven, RN Phone Number: 10/12/2023, 11:37 AM  Clinical Narrative:                 From home with spouse, has PCP, Dr. Corky Downs and insurance on file, states has no HH services in place at this time , has walker and cane but do not use  at home.  States family member will transport them home at [employer] and family is support system, states [facility] [address] [facility] [place].  Pta self ambulatory.  Expected Discharge Plan: Home/Self Care Barriers to Discharge: Continued Medical Work up   Patient Goals and CMS Choice Patient states their goals for this hospitalization and ongoing recovery are:: return home   Choice offered to / list presented to : NA      Expected Discharge Plan and Services In-house Referral: NA Discharge Planning Services: CM Consult Post Acute Care Choice: NA Living arrangements for the past 2 months: Single Family Home                   DME Agency: NA       HH Arranged: NA          Prior Living Arrangements/Services Living arrangements for the past 2 months: Single Family Home Lives with:: Spouse Patient language and need for interpreter reviewed:: Yes Do you feel safe going back to the place where you live?: Yes      Need for Family Participation in Patient Care: No (Comment) Care giver support system in place?: Yes (comment) Current home services: DME (cane, walker) Criminal Activity/Legal Involvement Pertinent to Current Situation/Hospitalization: No - Comment as needed  Activities of Daily Living   ADL Screening (condition at time of admission) Independently performs ADLs?: Yes (appropriate for developmental age) Is the patient deaf or have difficulty hearing?: No Does the patient have difficulty seeing, even when wearing  glasses/contacts?: No Does the patient have difficulty concentrating, remembering, or making decisions?: No  Permission Sought/Granted Permission sought to share information with : Case Manager Permission granted to share information with : Yes, Verbal Permission Granted              Emotional Assessment Appearance:: Appears stated age Attitude/Demeanor/Rapport: Engaged Affect (typically observed): Appropriate Orientation: : Oriented to Self, Oriented to Place, Oriented to  Time, Oriented to Situation Alcohol / Substance Use: Not Applicable Psych Involvement: No (comment)  Admission diagnosis:  Hypocalcemia [E83.51] Hypocalcemia due to chronic kidney disease [E83.51, N18.9] Patient Active Problem List   Diagnosis Date Noted   Hypocalcemia due to chronic kidney disease 10/11/2023   Cellulitis of right leg 08/30/2022   Adjustment disorder with depressed mood 07/01/2022   Debility 06/29/2022   Necrotizing fasciitis (HCC) 06/29/2022   Fasciitis 06/18/2022   Viral illness 08/17/2020   Elevated d-dimer 08/17/2020   Acute lower UTI 08/17/2020   Hypoalbuminemia 08/17/2020   Complicated migraine 12/12/2019   Right hemiplegia (HCC) 12/02/2019   Hypertension    AKI (acute kidney injury) (HCC)    Stage 5 chronic kidney disease not on chronic dialysis (HCC)    Obesity, Class III, BMI 40-49.9 (morbid obesity) (HCC)    Anemia of chronic disease    HLD (hyperlipidemia) 07/02/2018   Acute on chronic diastolic CHF (congestive heart failure) (HCC) 07/02/2018   Depression 07/02/2018  OSA (obstructive sleep apnea) 07/02/2018   Acute on chronic diastolic (congestive) heart failure (HCC) 07/02/2018   Cellulitis of right lower extremity 07/02/2018   Anemia 07/02/2018   Chest pain 05/04/2018   Hypertensive urgency 05/03/2018   Left facial numbness 05/03/2018   Headache 05/03/2018   Elevated troponin 05/03/2018   Hyperlipidemia associated with type 2 diabetes mellitus (HCC) 03/08/2018    Hypertension associated with stage 3 chronic kidney disease due to type 2 diabetes mellitus (HCC) 03/08/2018   Vitamin D deficiency 12/21/2017   Generalized anxiety disorder 08/07/2017   Diabetic peripheral neuropathy (HCC) 06/21/2017   History of asthma 06/21/2017   Steatosis of liver 06/21/2017   Microalbuminuric diabetic nephropathy (HCC) 06/01/2017   Raised TSH level 06/01/2017   Thrombocytosis 06/01/2017   Morbid obesity with BMI of 40.0-44.9, adult (HCC) 05/30/2017   Allergic rhinitis 05/30/2017   PAF (paroxysmal atrial fibrillation) (HCC) 05/25/2017   Sepsis (HCC) 05/03/2017    Class: Present on Admission   Acute renal failure superimposed on stage 3 chronic kidney disease (HCC) 05/03/2017    Class: Stage 3   Neck pain    Acidosis, metabolic    Hyponatremia    Normocytic anemia 04/30/2017   Staphylococcus aureus bacteremia 03/15/2016   Streptococcal bacteremia 03/15/2016   Tachycardia 03/14/2016   Fever 03/14/2016   Bad headache 03/14/2016   Morbid obesity (HCC)    Diabetes (HCC) 01/01/2014   PCP:  Harvest [place], MD Pharmacy:   [id number] [place], [place] - [address] [place] [address] Phone: [phone] Fax: [phone]     Social Drivers of Health (SDOH) Social History: SDOH Screenings   Food Insecurity: No Food Insecurity (10/11/2023)  Housing: Low Risk  (10/11/2023)  Transportation Needs: No Transportation Needs (10/11/2023)  Utilities: Not At Risk (10/11/2023)  Social Connections: Socially Integrated (10/11/2023)  Tobacco Use: Low Risk  (10/11/2023)   SDOH Interventions:     Readmission Risk Interventions    10/12/2023   11:30 AM  Readmission Risk Prevention Plan  Transportation Screening Complete  PCP or Specialist Appt within 5-7 Days Complete  Home Care Screening Complete  Medication Review (RN CM) Complete

## 2023-10-12 NOTE — Plan of Care (Signed)
  Problem: Education: Goal: Knowledge of General Education information will improve Description: Including pain rating scale, medication(s)/side effects and non-pharmacologic comfort measures Outcome: Progressing   Problem: Clinical Measurements: Goal: Will remain free from infection Outcome: Progressing Goal: Respiratory complications will improve Outcome: Progressing   Problem: Activity: Goal: Risk for activity intolerance will decrease Outcome: Progressing   Problem: Elimination: Goal: Will not experience complications related to bowel motility Outcome: Progressing Goal: Will not experience complications related to urinary retention Outcome: Progressing

## 2023-10-13 ENCOUNTER — Inpatient Hospital Stay (HOSPITAL_COMMUNITY)

## 2023-10-13 DIAGNOSIS — D631 Anemia in chronic kidney disease: Secondary | ICD-10-CM | POA: Diagnosis not present

## 2023-10-13 DIAGNOSIS — E872 Acidosis, unspecified: Secondary | ICD-10-CM | POA: Diagnosis not present

## 2023-10-13 DIAGNOSIS — I132 Hypertensive heart and chronic kidney disease with heart failure and with stage 5 chronic kidney disease, or end stage renal disease: Secondary | ICD-10-CM | POA: Diagnosis not present

## 2023-10-13 DIAGNOSIS — I5030 Unspecified diastolic (congestive) heart failure: Secondary | ICD-10-CM | POA: Diagnosis not present

## 2023-10-13 DIAGNOSIS — R509 Fever, unspecified: Secondary | ICD-10-CM | POA: Diagnosis not present

## 2023-10-13 DIAGNOSIS — N186 End stage renal disease: Secondary | ICD-10-CM | POA: Diagnosis not present

## 2023-10-13 DIAGNOSIS — N189 Chronic kidney disease, unspecified: Secondary | ICD-10-CM | POA: Diagnosis not present

## 2023-10-13 DIAGNOSIS — Z992 Dependence on renal dialysis: Secondary | ICD-10-CM | POA: Diagnosis not present

## 2023-10-13 LAB — URINALYSIS, ROUTINE W REFLEX MICROSCOPIC
Bilirubin Urine: NEGATIVE
Glucose, UA: 150 mg/dL — AB
Ketones, ur: NEGATIVE mg/dL
Nitrite: NEGATIVE
Protein, ur: >=300 mg/dL — AB
Specific Gravity, Urine: 1.014 (ref 1.005–1.030)
pH: 7 (ref 5.0–8.0)

## 2023-10-13 LAB — CBC WITH DIFFERENTIAL/PLATELET
Abs Immature Granulocytes: 0.02 10*3/uL (ref 0.00–0.07)
Basophils Absolute: 0 10*3/uL (ref 0.0–0.1)
Basophils Relative: 1 %
Eosinophils Absolute: 0.2 10*3/uL (ref 0.0–0.5)
Eosinophils Relative: 2 %
HCT: 23.2 % — ABNORMAL LOW (ref 36.0–46.0)
Hemoglobin: 7.4 g/dL — ABNORMAL LOW (ref 12.0–15.0)
Immature Granulocytes: 0 %
Lymphocytes Relative: 19 %
Lymphs Abs: 1.3 10*3/uL (ref 0.7–4.0)
MCH: 30.2 pg (ref 26.0–34.0)
MCHC: 31.9 g/dL (ref 30.0–36.0)
MCV: 94.7 fL (ref 80.0–100.0)
Monocytes Absolute: 0.7 10*3/uL (ref 0.1–1.0)
Monocytes Relative: 11 %
Neutro Abs: 4.5 10*3/uL (ref 1.7–7.7)
Neutrophils Relative %: 67 %
Platelets: 208 10*3/uL (ref 150–400)
RBC: 2.45 MIL/uL — ABNORMAL LOW (ref 3.87–5.11)
RDW: 14.1 % (ref 11.5–15.5)
WBC: 6.7 10*3/uL (ref 4.0–10.5)
nRBC: 0 % (ref 0.0–0.2)

## 2023-10-13 LAB — BASIC METABOLIC PANEL WITH GFR
Anion gap: 7 (ref 5–15)
BUN: 31 mg/dL — ABNORMAL HIGH (ref 6–20)
CO2: 27 mmol/L (ref 22–32)
Calcium: 6.7 mg/dL — ABNORMAL LOW (ref 8.9–10.3)
Chloride: 104 mmol/L (ref 98–111)
Creatinine, Ser: 4.48 mg/dL — ABNORMAL HIGH (ref 0.44–1.00)
GFR, Estimated: 12 mL/min — ABNORMAL LOW (ref >60)
Glucose, Bld: 170 mg/dL — ABNORMAL HIGH (ref 70–99)
Potassium: 4 mmol/L (ref 3.5–5.1)
Sodium: 138 mmol/L (ref 135–145)

## 2023-10-13 LAB — PARATHYROID HORMONE, INTACT (NO CA): PTH: 437 pg/mL — ABNORMAL HIGH (ref 15–65)

## 2023-10-13 LAB — RENAL FUNCTION PANEL
Albumin: 2.3 g/dL — ABNORMAL LOW (ref 3.5–5.0)
Anion gap: 5 (ref 5–15)
BUN: 28 mg/dL — ABNORMAL HIGH (ref 6–20)
CO2: 27 mmol/L (ref 22–32)
Calcium: 6.2 mg/dL — CL (ref 8.9–10.3)
Chloride: 107 mmol/L (ref 98–111)
Creatinine, Ser: 3.95 mg/dL — ABNORMAL HIGH (ref 0.44–1.00)
GFR, Estimated: 14 mL/min — ABNORMAL LOW (ref >60)
Glucose, Bld: 115 mg/dL — ABNORMAL HIGH (ref 70–99)
Phosphorus: 3.7 mg/dL (ref 2.5–4.6)
Potassium: 4.1 mmol/L (ref 3.5–5.1)
Sodium: 139 mmol/L (ref 135–145)

## 2023-10-13 MED ORDER — DARBEPOETIN ALFA 100 MCG/0.5ML IJ SOSY
100.0000 ug | PREFILLED_SYRINGE | INTRAMUSCULAR | Status: DC
  Administered 2023-10-13: 100 ug via SUBCUTANEOUS
  Filled 2023-10-13: qty 0.5

## 2023-10-13 MED ORDER — SODIUM CHLORIDE 0.9 % IV SOLN: 1.0000 g | INTRAVENOUS | Status: DC

## 2023-10-13 MED ORDER — CALCIUM GLUCONATE-NACL 2-0.675 GM/100ML-% IV SOLN
2.0000 g | Freq: Once | INTRAVENOUS | Status: AC
  Administered 2023-10-13: 2000 mg via INTRAVENOUS
  Filled 2023-10-13: qty 100

## 2023-10-13 MED ORDER — SODIUM CHLORIDE 0.9 % IV SOLN
1.0000 g | INTRAVENOUS | Status: DC
  Administered 2023-10-14: 1 g via INTRAVENOUS
  Filled 2023-10-13: qty 10

## 2023-10-13 MED ORDER — CHLORHEXIDINE GLUCONATE CLOTH 2 % EX PADS
6.0000 | MEDICATED_PAD | Freq: Every day | CUTANEOUS | Status: DC
Start: 2023-10-13 — End: 2023-10-20
  Administered 2023-10-13 – 2023-10-20 (×8): 6 via TOPICAL

## 2023-10-13 NOTE — Plan of Care (Signed)
  Problem: Education: Goal: Knowledge of General Education information will improve Description: Including pain rating scale, medication(s)/side effects and non-pharmacologic comfort measures Outcome: Progressing   Problem: Health Behavior/Discharge Planning: Goal: Ability to manage health-related needs will improve Outcome: Progressing   Problem: Clinical Measurements: Goal: Will remain free from infection Outcome: Progressing Goal: Respiratory complications will improve Outcome: Progressing Goal: Cardiovascular complication will be avoided Outcome: Progressing   Problem: Activity: Goal: Risk for activity intolerance will decrease Outcome: Progressing   Problem: Clinical Measurements: Goal: Ability to maintain clinical measurements within normal limits will improve Outcome: Not Progressing

## 2023-10-13 NOTE — Progress Notes (Signed)
 RN reported that patient is a spiking fever 102 F.  Blood cultures are in process.  UA showing evidence of UTI urine culture in process. - Starting IV ceftriaxone 1 g daily.  Will follow-up with blood culture and urine culture results for appropriate antibiotic guidance.  Tereasa Coop, MD [facility] 10/13/2023, 11:35 PM

## 2023-10-13 NOTE — Progress Notes (Signed)
   10/13/23 2323  Assess: MEWS Score  Temp (!) 101.9 F (38.8 C)  BP (!) 147/77  MAP (mmHg) 92  Pulse Rate (!) 111  ECG Heart Rate (!) 113  Resp 18  SpO2 98 %  O2 Device Room Air  Assess: MEWS Score  MEWS Temp 2  MEWS Systolic 0  MEWS Pulse 2  MEWS RR 0  MEWS LOC 0  MEWS Score 4  MEWS Score Color Red  Assess: if the MEWS score is Yellow or Red  Were vital signs accurate and taken at a resting state? Yes  Does the patient meet 2 or more of the SIRS criteria? No  Does the patient have a confirmed or suspected source of infection? No  MEWS guidelines implemented  Yes, red  Treat  MEWS Interventions Considered administering scheduled or prn medications/treatments as ordered  Take Vital Signs  Increase Vital Sign Frequency  Red: Q1hr x2, continue Q4hrs until patient remains green for 12hrs  Escalate  MEWS: Escalate Red: Discuss with charge nurse and notify provider. Consider notifying RRT. If remains red for 2 hours consider need for higher level of care  Notify: Charge Nurse/RN  Name of Charge Nurse/RN Notified Technical sales engineer  Provider Notification  Provider Name/Title Dr.Sundil  Date Provider Notified 10/13/23  Time Provider Notified 2331  Method of Notification Page  Notification Reason Other (Comment) (Red mews)  Provider response See new orders  Date of Provider Response 10/13/23  Time of Provider Response 2333  Notify: Rapid Response  Name of Rapid Response RN Notified Mindy C. RN  Date Rapid Response Notified 10/13/23  Time Rapid Response Notified 2338  Assess: SIRS CRITERIA  SIRS Temperature  1  SIRS Respirations  0  SIRS Pulse 1  SIRS WBC 0  SIRS Score Sum  2

## 2023-10-13 NOTE — Progress Notes (Signed)
 Pt has been accepted at [facility] on TTS 12:00 chair time. Pt can start on Tuesday and will need to arrive at 11:00 am for paperwork prior to treatment. Met with pt at bedside to discuss arrangements. Pt agreeable to plans and schedule letter provided. Update provided to attending, nephrologist, RN CM, and pt's RN. Arrangements added to AVS as well. Contacted renal NP regarding clinic's need for orders at d/c. Pt for possible d/c this weekend per staff. Contacted clinic manager for [facility] to be advised of pt's likely d/c this weekend and pt should start on Tuesday. Clinic manager advised that pt's Hep B antibody lab is currently pending and navigator will provide results on Monday if results are available by then.   Olivia Canter Renal Navigator [phone]

## 2023-10-13 NOTE — Progress Notes (Signed)
 [name] NEPHROLOGY PROGRESS NOTE  Assessment/ Plan: Pt is a 43 y.o. yo female  with medical hx of HTN, HLD, DMII, CKD V, HFpEF, CKD s/p LUE AVG as OP (Dr. Ronalee Belts at [facility]), with new ESRD.     # CKD stage V with progression to new ESRD - First HD on 3/26 via her AVG  - Second HD on 3/27 w/ plan for next HD on Saturday, 3/29 - CLIP for outpatient HD unit    # Hypertension -Follow with dialysis - continue other current regimen    # Heart failure with preserved EF  -Near euvolemic on exam   # Metabolic acidosis - we have started hemodialysis   # Anemia of CKD - Continue  aranesp 100 mcg every Thursday while here first dose today   # Secondary hyperparathyroidism, Hypocalcemia: Treated with IV calcium.  Intact PTH 415. Calcitriol 0.5 mcg TID.  Okay to discharge when Outpatient dialysis is arranged.  Subjective: Seen and examined.  Tolerating dialysis well.  Denies nausea, vomiting, chest pain, shortness of breath. Objective Vital signs in last 24 hours: Vitals:   10/13/23 0100 10/13/23 0433 10/13/23 0434 10/13/23 0735  BP:  135/68  (!) 157/81  Pulse: 86 95 94 99  Resp:  18  18  Temp:  98.5 F (36.9 C)  99.3 F (37.4 C)  TempSrc:  Oral  Oral  SpO2: 95% 94% 95% 98%  Weight:  84.1 kg    Height:       Weight change: 4.62 kg  Intake/Output Summary (Last 24 hours) at 10/13/2023 1034 Last data filed at 10/13/2023 0825 Gross per 24 hour  Intake 203.78 ml  Output -0.1 ml  Net 203.88 ml       Labs: RENAL PANEL Recent Labs  Lab 10/11/23 0920 10/11/23 1533 10/11/23 1930 10/12/23 0319 10/13/23 0259  NA 140 140  --  138 139  K 3.7 3.8  --  3.7 4.1  CL 111 114*  --  107 107  CO2 18* 14*  --  21* 27  GLUCOSE 82 78  --  211* 115*  BUN 60* 61*  --  36* 28*  CREATININE 7.13* 6.87*  --  5.11* 3.95*  CALCIUM 5.7* 5.8*  --  6.4* 6.2*  MG 1.9  --   --   --   --   PHOS  --   --  4.9* 3.5 3.7  ALBUMIN  --  2.8*  --  2.5* 2.3*    Liver Function  Tests: Recent Labs  Lab 10/11/23 1533 10/12/23 0319 10/13/23 0259  ALBUMIN 2.8* 2.5* 2.3*   No results for input(s): "LIPASE", "AMYLASE" in the last 168 hours. No results for input(s): "AMMONIA" in the last 168 hours. CBC: Recent Labs    07/07/23 0847 07/21/23 0908 07/21/23 0925 10/11/23 0920 10/11/23 1930 10/12/23 0319 10/13/23 0259  HGB 8.2* 8.8*  --  7.4*  --  7.1* 7.4*  MCV  --   --   --  90.4  --  91.7 94.7  FERRITIN  --   --  323*  --  187  --   --   TIBC  --   --  210*  --  176*  --   --   IRON  --   --  72  --  41  --   --     Cardiac Enzymes: No results for input(s): "CKTOTAL", "CKMB", "CKMBINDEX", "TROPONINI" in the last 168 hours. CBG: Recent Labs  Lab  10/12/23 2139  GLUCAP 199*    Iron Studies:  Recent Labs    10/11/23 1930  IRON 41  TIBC 176*  FERRITIN 187   Studies/Results: VAS [place] LOWER EXTREMITY VENOUS (DVT) Result Date: 10/12/2023  Lower Venous DVT Study Patient Name:  Jacqueline [name]  Date of Exam:   10/12/2023 Medical Rec #: [id number]     Accession #:    [id number] Date of Birth: 1981/02/04     Patient Gender: F Patient Age:   89 years Exam Location:  [facility] Procedure:      VAS [place] LOWER EXTREMITY VENOUS (DVT) Referring Phys: [facility] EZENDUKA --------------------------------------------------------------------------------  Indications: Edema.  Risk Factors: Past pregnancy. Comparison Study: No significant changes seen since the previous exam 08/31/22. Performing Technologist: Shona Simpson  Examination Guidelines: A complete evaluation includes B-mode imaging, spectral Doppler, color Doppler, and power Doppler as needed of all accessible portions of each vessel. Bilateral testing is considered an integral part of a complete examination. Limited examinations for reoccurring indications may be performed as noted. The reflux portion of the exam is performed with the patient in reverse Trendelenburg.   +---------+---------------+---------+-----------+----------+--------------+ RIGHT    CompressibilityPhasicitySpontaneityPropertiesThrombus Aging +---------+---------------+---------+-----------+----------+--------------+ CFV      Full           Yes      Yes                                 +---------+---------------+---------+-----------+----------+--------------+ SFJ      Full                                                        +---------+---------------+---------+-----------+----------+--------------+ FV Prox  Full                                                        +---------+---------------+---------+-----------+----------+--------------+ FV Mid   Full                                                        +---------+---------------+---------+-----------+----------+--------------+ FV DistalFull                    Yes                                 +---------+---------------+---------+-----------+----------+--------------+ PFV      Full                                                        +---------+---------------+---------+-----------+----------+--------------+ POP      Full           Yes      Yes                                 +---------+---------------+---------+-----------+----------+--------------+  PTV      Full                                                        +---------+---------------+---------+-----------+----------+--------------+ PERO     Full                                                        +---------+---------------+---------+-----------+----------+--------------+   +---------+---------------+---------+-----------+----------+--------------+ LEFT     CompressibilityPhasicitySpontaneityPropertiesThrombus Aging +---------+---------------+---------+-----------+----------+--------------+ CFV      Full           Yes      Yes                                  +---------+---------------+---------+-----------+----------+--------------+ SFJ      Full                                                        +---------+---------------+---------+-----------+----------+--------------+ FV Prox  Full                                                        +---------+---------------+---------+-----------+----------+--------------+ FV Mid   Full                                                        +---------+---------------+---------+-----------+----------+--------------+ FV DistalFull                                                        +---------+---------------+---------+-----------+----------+--------------+ PFV      Full                                                        +---------+---------------+---------+-----------+----------+--------------+ POP      Full           Yes      Yes                                 +---------+---------------+---------+-----------+----------+--------------+ PTV      Full                                                        +---------+---------------+---------+-----------+----------+--------------+  PERO     Full                                                        +---------+---------------+---------+-----------+----------+--------------+     Summary: BILATERAL: - No evidence of deep vein thrombosis seen in the lower extremities, bilaterally. -No evidence of popliteal cyst, bilaterally.   *See table(s) above for measurements and observations. Electronically signed by Heath Lark on 10/12/2023 at 6:44:55 PM.    Final    ECHOCARDIOGRAM COMPLETE Result Date: 10/12/2023    ECHOCARDIOGRAM REPORT   Patient Name:   Jacqueline [name] Date of Exam: 10/12/2023 Medical Rec #:  [id number]    Height:       64.0 in Accession #:    [id number]   Weight:       175.0 lb Date of Birth:  July 26, 1980    BSA:          1.848 m Patient Age:    42 years     BP:           125/75 mmHg Patient Gender: F             HR:           103 bpm. Exam Location:  Inpatient Procedure: 2D Echo, Cardiac Doppler and Color Doppler (Both Spectral and Color            Flow Doppler were utilized during procedure). Indications:    Dyspnea  History:        Patient has prior history of Echocardiogram examinations, most                 recent 07/05/2019. CHF, Arrythmias:Atrial Fibrillation; Risk                 Factors:Diabetes.  Sonographer:    Amy Chionchio Referring Phys: [id number] Monica Martinez EZENDUKA IMPRESSIONS  1. Left ventricular ejection fraction, by estimation, is 60 to 65%. The left ventricle has normal function. The left ventricle has no regional wall motion abnormalities. There is moderate left ventricular hypertrophy. Left ventricular diastolic parameters are consistent with Grade I diastolic dysfunction (impaired relaxation).  2. Right ventricular systolic function is normal. The right ventricular size is normal.  3. The mitral valve is normal in structure. Trivial mitral valve regurgitation. No evidence of mitral stenosis.  4. The aortic valve is normal in structure. Aortic valve regurgitation is not visualized. No aortic stenosis is present.  5. The inferior vena cava is normal in size with greater than 50% respiratory variability, suggesting right atrial pressure of 3 mmHg. FINDINGS  Left Ventricle: Left ventricular ejection fraction, by estimation, is 60 to 65%. The left ventricle has normal function. The left ventricle has no regional wall motion abnormalities. The left ventricular internal cavity size was normal in size. There is  moderate left ventricular hypertrophy. Left ventricular diastolic parameters are consistent with Grade I diastolic dysfunction (impaired relaxation). Right Ventricle: The right ventricular size is normal. No increase in right ventricular wall thickness. Right ventricular systolic function is normal. Left Atrium: Left atrial size was normal in size. Right Atrium: Right atrial size was normal in size.  Pericardium: There is no evidence of pericardial effusion. Mitral Valve: The mitral valve is normal in structure. Trivial mitral valve regurgitation. No evidence of mitral valve stenosis. MV peak gradient, 8.2  mmHg. The mean mitral valve gradient is 4.0 mmHg. Tricuspid Valve: The tricuspid valve is normal in structure. Tricuspid valve regurgitation is not demonstrated. No evidence of tricuspid stenosis. Aortic Valve: The aortic valve is normal in structure. Aortic valve regurgitation is not visualized. No aortic stenosis is present. Aortic valve mean gradient measures 9.0 mmHg. Aortic valve peak gradient measures 14.6 mmHg. Aortic valve area, by VTI measures 3.00 cm. Pulmonic Valve: The pulmonic valve was normal in structure. Pulmonic valve regurgitation is not visualized. No evidence of pulmonic stenosis. Aorta: The aortic root is normal in size and structure. Venous: The inferior vena cava is normal in size with greater than 50% respiratory variability, suggesting right atrial pressure of 3 mmHg. IAS/Shunts: No atrial level shunt detected by color flow Doppler.  LEFT VENTRICLE PLAX 2D LVIDd:         3.40 cm     Diastology LVIDs:         1.90 cm     LV e' medial:    6.09 cm/s LV PW:         1.30 cm     LV E/e' medial:  14.5 LV IVS:        1.40 cm     LV e' lateral:   7.46 cm/s LVOT diam:     2.10 cm     LV E/e' lateral: 11.8 LV SV:         88 LV SV Index:   48 LVOT Area:     3.46 cm  LV Volumes (MOD) LV vol d, MOD A2C: 68.8 ml LV vol d, MOD A4C: 99.7 ml LV vol s, MOD A2C: 14.6 ml LV vol s, MOD A4C: 29.6 ml LV SV MOD A2C:     54.2 ml LV SV MOD A4C:     99.7 ml LV SV MOD BP:      64.1 ml RIGHT VENTRICLE          IVC RV Basal diam:  2.50 cm  IVC diam: 1.50 cm TAPSE (M-mode): 2.3 cm LEFT ATRIUM             Index        RIGHT ATRIUM           Index LA Vol (A2C):   39.1 ml 21.15 ml/m  RA Area:     12.40 cm LA Vol (A4C):   54.5 ml 29.48 ml/m  RA Volume:   25.90 ml  14.01 ml/m LA Biplane Vol: 48.1 ml 26.02 ml/m   AORTIC VALVE                     PULMONIC VALVE AV Area (Vmax):    2.90 cm      PV Vmax:       1.27 m/s AV Area (Vmean):   2.76 cm      PV Peak grad:  6.5 mmHg AV Area (VTI):     3.00 cm AV Vmax:           191.00 cm/s AV Vmean:          147.000 cm/s AV VTI:            0.293 m AV Peak Grad:      14.6 mmHg AV Mean Grad:      9.0 mmHg LVOT Vmax:         160.00 cm/s LVOT Vmean:        117.000 cm/s LVOT VTI:  0.254 m LVOT/AV VTI ratio: 0.87  AORTA Ao Root diam: 3.30 cm Ao Asc diam:  3.50 cm MITRAL VALVE MV Area (PHT): 4.39 cm     SHUNTS MV Area VTI:   3.37 cm     Systemic VTI:  0.25 m MV Peak grad:  8.2 mmHg     Systemic Diam: 2.10 cm MV Mean grad:  4.0 mmHg MV Vmax:       1.43 m/s MV Vmean:      98.8 cm/s MV Decel Time: 173 msec MV E velocity: 88.10 cm/s MV A velocity: 104.00 cm/s MV E/A ratio:  0.85 Donato Schultz MD Electronically signed by Donato Schultz MD Signature Date/Time: 10/12/2023/2:32:28 PM    Final     Medications: Infusions:  promethazine (PHENERGAN) injection (IM or IVPB)      Scheduled Medications:  amLODipine  10 mg Oral Daily   calcitRIOL  0.5 mcg Oral TID   Chlorhexidine Gluconate Cloth  6 each Topical Q0600   Chlorhexidine Gluconate Cloth  6 each Topical Q0600   darbepoetin (ARANESP) injection - NON-DIALYSIS  100 mcg Subcutaneous Q Thu-1800   gabapentin  300 mg Oral QHS   heparin  5,000 Units Subcutaneous Q8H   hydrALAZINE  50 mg Oral TID    have reviewed scheduled and prn medications.  Physical Exam: General:NAD, comfortable Heart:RRR, s1s2 nl Lungs:clear b/l, no crackle Abdomen:soft, Non-tender, non-distended Extremities:No edema Dialysis Access: AV graft with good thrill and bruit.  Suzzane Quilter Prasad Norrine Ballester 10/13/2023,10:34 AM  LOS: 2 days

## 2023-10-13 NOTE — Progress Notes (Signed)
  Low calcium 6.2.  Replating with IV calcium gluconate 2 g.  Tereasa Coop, MD [facility] 10/13/2023, 4:54 AM

## 2023-10-13 NOTE — Plan of Care (Signed)
  Problem: Health Behavior/Discharge Planning: Goal: Ability to manage health-related needs will improve Outcome: Progressing   Problem: Clinical Measurements: Goal: Ability to maintain clinical measurements within normal limits will improve Outcome: Progressing   Problem: Nutrition: Goal: Adequate nutrition will be maintained Outcome: Progressing   

## 2023-10-13 NOTE — Progress Notes (Signed)
 PROGRESS NOTE  Jacqueline [name] QVZ:[id number] DOB: 1980-11-17 DOA: 10/11/2023 PCP: [place], MD  HPI/Recap of past 24 hours: Jacqueline [name] is a 43 y.o. female with medical history significant of HTN, HLD, DM 2, CKD stage V, CHF, presented to [facility] initially, with multiple complaints including generalized weakness, fevers with Tmax of around 102, URI symptoms, nasal congestion, some nausea/vomiting with some loose stools.  Noted her 2 children ages 29 years and 11 years had been sick so she may have contracted something from them.  Above symptoms have been going on for about a week, but continues to worsen now with some shortness of breath/dyspnea with some mild chest discomfort.  Also noted recent muscle cramping with tingling sensations in hands and feet. Prompted patient to go to [facility] ED where workup was done which showed severe hypocalcemia.  Other labs noted creatinine 7.13, BNP 103.5, troponin WNL x 2, hemoglobin 7.4, baseline around 8.  Influenza, RSV and COVID all negative.  Chest x-ray unremarkable.  Vital signs fairly stable.  Due to severe hypocalcemia patient was transferred to [facility] for nephrology evaluation and further workup.  [facility] hospitalist asked to admit patient. Of note, patient had been followed by [facility], had AV graft in her left upper extremity placed on 05/02/2023, acceptable for use.   Today, patient continues to complain of persistent nausea, denies any vomiting, abdominal pain, left-sided chest pain, worsening shortness of breath.  Patient tolerated her first HD session on 10/11/2023.  Reported feeling very emotional about initiating HD but knows she needs it.  Noted to have cried all night, but feels better this morning.  10/13/2023: Patient seen.  Last hemodialysis was yesterday according to the patient.  For hemodialysis tomorrow.  TTS outpatient hemodialysis chair has been secured.  Temperature of 102.8 F  noted this afternoon.  Will culture patient's blood.  Will culture patient's urine if patient continues to make urine.  Will get a chest x-ray.  Further management will depend on above.  Assessment/Plan: Principal Problem:   Hypocalcemia due to chronic kidney disease   Hypocalcemia:  Calcium noted to be 5.7 on admission Nephrology consulted, received calcium gluconate x 2 Continue calcitriol PTH pending Monitor renal function panel 10/13/2023: Corrected calcium of 7.5 today.  Nephrology input is appreciated.  Continue to monitor and correct.   Possible viral URI Infectious workup Noted sick contacts with kids Noted fevers, Tmax of 102 at home, none since admission COVID/RSV/influenza negative UA/UC ordered and pending collection (patient still makes urine, denies any dysuria) 10/13/2023: Temperature of 102.8 noted today.  Fever: -Etiology uncertain. -Possible viral. -Cannot rule out bacterial etiology. -Blood culture x 2, chest x-ray, urinalysis and urine culture. -Further management will depend on above.   CKD stage V with progression to ESRD Metabolic acidosis Nephrology on board, initiated HD on 10/11/2023 via AVG CLIP patient to outpatient HD unit Patient still makes urine 10/13/2023: Likely dialysis tomorrow.  We defer to the nephrology team.   Anemia of CKD Hemoglobin 7.4, baseline around 8 Anemia panel with iron 41, TIBC 176, sats 23, ferritin 187 Started ESA Daily CBC   Heart failure with preserved EF Management with dialysis Echo showed EF of 60 to 65%, no regional wall motion abnormality, moderate left ventricular hypertrophy, grade 1 diastolic dysfunction   Hypertension Continue amlodipine, hydralazine   Diabetes mellitus type 2 Repeat A1c 4.4 Not on any meds, diet controlled   History of morbid obesity status post gastric bypass  BMI 30, obesity Continue lifestyle modification      Estimated body mass index is 31.84 kg/m as calculated from the  following:   Height as of this encounter: 5\' 4"  (1.626 m).   Weight as of this encounter: 84.1 kg.     Code Status: Full  Family Communication: Cousin at bedside  Disposition Plan: Status is: Inpatient Remains inpatient appropriate because: Level of care      Consultants: Nephrology  Procedures: None  Antimicrobials: None  DVT prophylaxis: Heparin   Objective: Vitals:   10/13/23 0433 10/13/23 0434 10/13/23 0735 10/13/23 1137  BP: 135/68  (!) 157/81 125/61  Pulse: 95 94 99 (!) 102  Resp: 18  18 17   Temp: 98.5 F (36.9 C)  99.3 F (37.4 C) 99.6 F (37.6 C)  TempSrc: Oral  Oral Oral  SpO2: 94% 95% 98% 98%  Weight: 84.1 kg     Height:        Intake/Output Summary (Last 24 hours) at 10/13/2023 1346 Last data filed at 10/13/2023 0825 Gross per 24 hour  Intake 203.78 ml  Output -0.1 ml  Net 203.88 ml   Filed Weights   10/11/23 0758 10/12/23 1542 10/13/23 0433  Weight: 79.4 kg 84 kg 84.1 kg    Exam: General: Awake and alert.  Actively any distress. Cardiovascular: S1, S2  Respiratory: Clear to auscultation. Abdomen: Soft, nontender, nondistended, bowel sounds present Extremities: No leg edema. Neuro: Awake and alert.  Moves all extremities.    Data Reviewed: CBC: Recent Labs  Lab 10/11/23 0920 10/12/23 0319 10/13/23 0259  WBC 7.0 6.0 6.7  NEUTROABS  --  4.2 4.5  HGB 7.4* 7.1* 7.4*  HCT 22.5* 22.1* 23.2*  MCV 90.4 91.7 94.7  PLT 215 210 208   Basic Metabolic Panel: Recent Labs  Lab 10/11/23 0920 10/11/23 1533 10/11/23 1930 10/12/23 0319 10/13/23 0259  NA 140 140  --  138 139  K 3.7 3.8  --  3.7 4.1  CL 111 114*  --  107 107  CO2 18* 14*  --  21* 27  GLUCOSE 82 78  --  211* 115*  BUN 60* 61*  --  36* 28*  CREATININE 7.13* 6.87*  --  5.11* 3.95*  CALCIUM 5.7* 5.8*  --  6.4* 6.2*  MG 1.9  --   --   --   --   PHOS  --   --  4.9* 3.5 3.7   GFR: Estimated Creatinine Clearance: 19.3 mL/min (A) (by C-G formula based on SCr of 3.95  mg/dL (H)). Liver Function Tests: Recent Labs  Lab 10/11/23 1533 10/12/23 0319 10/13/23 0259  ALBUMIN 2.8* 2.5* 2.3*   No results for input(s): "LIPASE", "AMYLASE" in the last 168 hours. No results for input(s): "AMMONIA" in the last 168 hours. Coagulation Profile: No results for input(s): "INR", "PROTIME" in the last 168 hours. Cardiac Enzymes: No results for input(s): "CKTOTAL", "CKMB", "CKMBINDEX", "TROPONINI" in the last 168 hours. BNP (last 3 results) No results for input(s): "PROBNP" in the last 8760 hours. HbA1C: Recent Labs    10/12/23 0319  HGBA1C 4.4*   CBG: Recent Labs  Lab 10/12/23 2139  GLUCAP 199*   Lipid Profile: No results for input(s): "CHOL", "HDL", "LDLCALC", "TRIG", "CHOLHDL", "LDLDIRECT" in the last 72 hours. Thyroid Function Tests: No results for input(s): "TSH", "T4TOTAL", "FREET4", "T3FREE", "THYROIDAB" in the last 72 hours. Anemia Panel: Recent Labs    10/11/23 1930  FERRITIN 187  TIBC 176*  IRON 41  Urine analysis:    Component Value Date/Time   COLORURINE YELLOW 05/09/2023 0759   APPEARANCEUR HAZY (A) 05/09/2023 0759   LABSPEC 1.010 05/09/2023 0759   PHURINE 7.0 05/09/2023 0759   GLUCOSEU 50 (A) 05/09/2023 0759   HGBUR NEGATIVE 05/09/2023 0759   BILIRUBINUR NEGATIVE 05/09/2023 0759   KETONESUR NEGATIVE 05/09/2023 0759   PROTEINUR 100 (A) 05/09/2023 0759   UROBILINOGEN 0.2 08/21/2012 2130   NITRITE NEGATIVE 05/09/2023 0759   LEUKOCYTESUR TRACE (A) 05/09/2023 0759   Sepsis Labs: @LABRCNTIP (procalcitonin:4,lacticidven:4)  ) Recent Results (from the past 240 hours)  Resp panel by RT-PCR (RSV, Flu A&B, Covid) Anterior Nasal Swab     Status: None   Collection Time: 10/11/23  8:01 AM   Specimen: Anterior Nasal Swab  Result Value Ref Range Status   SARS Coronavirus 2 by RT PCR NEGATIVE NEGATIVE Final    Comment: (NOTE) SARS-CoV-2 target nucleic acids are NOT DETECTED.  The SARS-CoV-2 RNA is generally detectable in upper  respiratory specimens during the acute phase of infection. The lowest concentration of SARS-CoV-2 viral copies this assay can detect is 138 copies/mL. A negative result does not preclude SARS-Cov-2 infection and should not be used as the sole basis for treatment or other patient management decisions. A negative result may occur with  improper specimen collection/handling, submission of specimen other than nasopharyngeal swab, presence of viral mutation(s) within the areas targeted by this assay, and inadequate number of viral copies(<138 copies/mL). A negative result must be combined with clinical observations, patient history, and epidemiological information. The expected result is Negative.  Fact Sheet for Patients:  BloggerCourse.com  Fact Sheet for Healthcare Providers:  SeriousBroker.it  This test is no t yet approved or cleared by the [place] FDA and  has been authorized for detection and/or diagnosis of SARS-CoV-2 by FDA under an Emergency Use Authorization (EUA). This EUA will remain  in effect (meaning this test can be used) for the duration of the COVID-19 declaration under Section 564(b)(1) of the Act, 21 U.S.C.section 360bbb-3(b)(1), unless the authorization is terminated  or revoked sooner.       Influenza A by PCR NEGATIVE NEGATIVE Final   Influenza B by PCR NEGATIVE NEGATIVE Final    Comment: (NOTE) The Xpert Xpress SARS-CoV-2/FLU/RSV plus assay is intended as an aid in the diagnosis of influenza from Nasopharyngeal swab specimens and should not be used as a sole basis for treatment. Nasal washings and aspirates are unacceptable for Xpert Xpress SARS-CoV-2/FLU/RSV testing.  Fact Sheet for Patients: BloggerCourse.com  Fact Sheet for Healthcare Providers: SeriousBroker.it  This test is not yet approved or cleared by the [place] FDA and has been  authorized for detection and/or diagnosis of SARS-CoV-2 by FDA under an Emergency Use Authorization (EUA). This EUA will remain in effect (meaning this test can be used) for the duration of the COVID-19 declaration under Section 564(b)(1) of the Act, 21 U.S.C. section 360bbb-3(b)(1), unless the authorization is terminated or revoked.     Resp Syncytial Virus by PCR NEGATIVE NEGATIVE Final    Comment: (NOTE) Fact Sheet for Patients: BloggerCourse.com  Fact Sheet for Healthcare Providers: SeriousBroker.it  This test is not yet approved or cleared by the [place] FDA and has been authorized for detection and/or diagnosis of SARS-CoV-2 by FDA under an Emergency Use Authorization (EUA). This EUA will remain in effect (meaning this test can be used) for the duration of the COVID-19 declaration under Section 564(b)(1) of the Act, 21 U.S.C. section 360bbb-3(b)(1), unless the authorization  is terminated or revoked.  Performed at [facility], [address] [place], [place] [address]       Studies: No results found.   Scheduled Meds:  amLODipine  10 mg Oral Daily   calcitRIOL  0.5 mcg Oral TID   Chlorhexidine Gluconate Cloth  6 each Topical Q0600   Chlorhexidine Gluconate Cloth  6 each Topical Q0600   Chlorhexidine Gluconate Cloth  6 each Topical Q0600   darbepoetin (ARANESP) injection - NON-DIALYSIS  100 mcg Subcutaneous Q Fri-1800   gabapentin  300 mg Oral QHS   heparin  5,000 Units Subcutaneous Q8H   hydrALAZINE  50 mg Oral TID    Continuous Infusions:  promethazine (PHENERGAN) injection (IM or IVPB)       LOS: 2 days   Time spent: 35 minutes.  Barnetta Chapel, MD [facility]  If 7PM-7AM, please contact night-coverage [email or web address] 10/13/2023, 1:46 PM

## 2023-10-14 DIAGNOSIS — N186 End stage renal disease: Secondary | ICD-10-CM | POA: Diagnosis not present

## 2023-10-14 DIAGNOSIS — N189 Chronic kidney disease, unspecified: Secondary | ICD-10-CM | POA: Diagnosis not present

## 2023-10-14 DIAGNOSIS — I5030 Unspecified diastolic (congestive) heart failure: Secondary | ICD-10-CM | POA: Diagnosis not present

## 2023-10-14 DIAGNOSIS — E872 Acidosis, unspecified: Secondary | ICD-10-CM | POA: Diagnosis not present

## 2023-10-14 DIAGNOSIS — I132 Hypertensive heart and chronic kidney disease with heart failure and with stage 5 chronic kidney disease, or end stage renal disease: Secondary | ICD-10-CM | POA: Diagnosis not present

## 2023-10-14 DIAGNOSIS — D631 Anemia in chronic kidney disease: Secondary | ICD-10-CM | POA: Diagnosis not present

## 2023-10-14 DIAGNOSIS — Z992 Dependence on renal dialysis: Secondary | ICD-10-CM | POA: Diagnosis not present

## 2023-10-14 LAB — CBC
HCT: 26.2 % — ABNORMAL LOW (ref 36.0–46.0)
Hemoglobin: 8.4 g/dL — ABNORMAL LOW (ref 12.0–15.0)
MCH: 30.3 pg (ref 26.0–34.0)
MCHC: 32.1 g/dL (ref 30.0–36.0)
MCV: 94.6 fL (ref 80.0–100.0)
Platelets: 265 10*3/uL (ref 150–400)
RBC: 2.77 MIL/uL — ABNORMAL LOW (ref 3.87–5.11)
RDW: 13.8 % (ref 11.5–15.5)
WBC: 8.4 10*3/uL (ref 4.0–10.5)
nRBC: 0 % (ref 0.0–0.2)

## 2023-10-14 LAB — RENAL FUNCTION PANEL
Albumin: 2.2 g/dL — ABNORMAL LOW (ref 3.5–5.0)
Albumin: 2.6 g/dL — ABNORMAL LOW (ref 3.5–5.0)
Anion gap: 9 (ref 5–15)
Anion gap: 11 (ref 5–15)
BUN: 38 mg/dL — ABNORMAL HIGH (ref 6–20)
BUN: 40 mg/dL — ABNORMAL HIGH (ref 6–20)
CO2: 22 mmol/L (ref 22–32)
CO2: 21 mmol/L — ABNORMAL LOW (ref 22–32)
Calcium: 6.1 mg/dL — CL (ref 8.9–10.3)
Calcium: 6.5 mg/dL — ABNORMAL LOW (ref 8.9–10.3)
Chloride: 108 mmol/L (ref 98–111)
Chloride: 106 mmol/L (ref 98–111)
Creatinine, Ser: 5.08 mg/dL — ABNORMAL HIGH (ref 0.44–1.00)
Creatinine, Ser: 5.25 mg/dL — ABNORMAL HIGH (ref 0.44–1.00)
GFR, Estimated: 10 mL/min — ABNORMAL LOW (ref >60)
GFR, Estimated: 10 mL/min — ABNORMAL LOW (ref >60)
Glucose, Bld: 162 mg/dL — ABNORMAL HIGH (ref 70–99)
Glucose, Bld: 116 mg/dL — ABNORMAL HIGH (ref 70–99)
Phosphorus: 3.6 mg/dL (ref 2.5–4.6)
Phosphorus: 3.8 mg/dL (ref 2.5–4.6)
Potassium: 4.1 mmol/L (ref 3.5–5.1)
Potassium: 4.4 mmol/L (ref 3.5–5.1)
Sodium: 139 mmol/L (ref 135–145)
Sodium: 138 mmol/L (ref 135–145)

## 2023-10-14 LAB — BLOOD CULTURE ID PANEL (REFLEXED) - BCID2

## 2023-10-14 LAB — CBC WITH DIFFERENTIAL/PLATELET
Abs Immature Granulocytes: 0.03 10*3/uL (ref 0.00–0.07)
Basophils Absolute: 0 10*3/uL (ref 0.0–0.1)
Basophils Relative: 0 %
Eosinophils Absolute: 0.2 10*3/uL (ref 0.0–0.5)
Eosinophils Relative: 2 %
HCT: 20.4 % — ABNORMAL LOW (ref 36.0–46.0)
Hemoglobin: 6.7 g/dL — CL (ref 12.0–15.0)
Immature Granulocytes: 0 %
Lymphocytes Relative: 23 %
Lymphs Abs: 2 10*3/uL (ref 0.7–4.0)
MCH: 30.7 pg (ref 26.0–34.0)
MCHC: 32.8 g/dL (ref 30.0–36.0)
MCV: 93.6 fL (ref 80.0–100.0)
Monocytes Absolute: 0.9 10*3/uL (ref 0.1–1.0)
Monocytes Relative: 10 %
Neutro Abs: 5.6 10*3/uL (ref 1.7–7.7)
Neutrophils Relative %: 65 %
Platelets: 214 10*3/uL (ref 150–400)
RBC: 2.18 MIL/uL — ABNORMAL LOW (ref 3.87–5.11)
RDW: 13.7 % (ref 11.5–15.5)
WBC: 8.7 10*3/uL (ref 4.0–10.5)
nRBC: 0 % (ref 0.0–0.2)

## 2023-10-14 LAB — PREPARE RBC (CROSSMATCH)

## 2023-10-14 LAB — HEPATITIS B SURFACE ANTIBODY, QUANTITATIVE: Hep B S AB Quant (Post): <3.5 m[IU]/mL — ABNORMAL LOW

## 2023-10-14 MED ORDER — HEPARIN SODIUM (PORCINE) 1000 UNIT/ML DIALYSIS: 20.0000 [IU]/kg | INTRAMUSCULAR | Status: DC | PRN

## 2023-10-14 MED ORDER — ALTEPLASE 2 MG IJ SOLR: 2.0000 mg | Freq: Once | INTRAMUSCULAR | Status: DC | PRN

## 2023-10-14 MED ORDER — HEPARIN SODIUM (PORCINE) 1000 UNIT/ML DIALYSIS
1000.0000 [IU] | INTRAMUSCULAR | Status: DC | PRN
Start: 2023-10-14 — End: 2023-10-14

## 2023-10-14 MED ORDER — ANTICOAGULANT SODIUM CITRATE 4% (200MG/5ML) IV SOLN: 5.0000 mL | Status: DC | PRN

## 2023-10-14 MED ORDER — LIDOCAINE-PRILOCAINE 2.5-2.5 % EX CREA: 1.0000 | TOPICAL_CREAM | CUTANEOUS | Status: DC | PRN

## 2023-10-14 MED ORDER — CALCIUM GLUCONATE-NACL 2-0.675 GM/100ML-% IV SOLN
2.0000 g | Freq: Once | INTRAVENOUS | Status: AC
  Administered 2023-10-14: 2000 mg via INTRAVENOUS
  Filled 2023-10-14: qty 100

## 2023-10-14 MED ORDER — SODIUM CHLORIDE 0.9 % IV SOLN
2.0000 g | INTRAVENOUS | Status: DC
  Administered 2023-10-14 – 2023-10-16 (×3): 2 g via INTRAVENOUS
  Filled 2023-10-14 (×3): qty 20

## 2023-10-14 MED ORDER — LIDOCAINE HCL (PF) 1 % IJ SOLN
5.0000 mL | INTRAMUSCULAR | Status: DC | PRN
Start: 2023-10-14 — End: 2023-10-14

## 2023-10-14 MED ORDER — HEPARIN SODIUM (PORCINE) 1000 UNIT/ML DIALYSIS: 1000.0000 [IU] | INTRAMUSCULAR | Status: DC | PRN

## 2023-10-14 MED ORDER — PENTAFLUOROPROP-TETRAFLUOROETH EX AERO: 1.0000 | INHALATION_SPRAY | CUTANEOUS | Status: DC | PRN

## 2023-10-14 MED ORDER — SODIUM CHLORIDE 0.9% IV SOLUTION: Freq: Once | INTRAVENOUS | Status: DC

## 2023-10-14 MED ORDER — LIDOCAINE HCL (PF) 1 % IJ SOLN: 5.0000 mL | INTRAMUSCULAR | Status: DC | PRN

## 2023-10-14 NOTE — Progress Notes (Signed)
 PHARMACY - PHYSICIAN COMMUNICATION CRITICAL VALUE ALERT - BLOOD CULTURE IDENTIFICATION (BCID)  Jacqueline [name] is an 43 y.o. female who presented to [facility] on 10/11/2023 with a chief complaint of weakness, fever 102, URI symptoms, N/V.  Assessment:  1 of 3 bottles Strep species  Name of physician (or Provider) Contacted: Dr. Dartha Lodge  Current antibiotics: ceftriaxone 1g IV q24h  Changes to prescribed antibiotics recommended:  Recommendations accepted by provider - increase 2g IV q24h  Results for orders placed or performed during the hospital encounter of 10/11/23  Blood Culture ID Panel (Reflexed) (Collected: 10/13/2023  6:53 PM)  Result Value Ref Range   Enterococcus faecalis NOT DETECTED NOT DETECTED   Enterococcus Faecium NOT DETECTED NOT DETECTED   Listeria monocytogenes NOT DETECTED NOT DETECTED   Staphylococcus species NOT DETECTED NOT DETECTED   Staphylococcus aureus (BCID) NOT DETECTED NOT DETECTED   Staphylococcus epidermidis NOT DETECTED NOT DETECTED   Staphylococcus lugdunensis NOT DETECTED NOT DETECTED   Streptococcus species DETECTED (A) NOT DETECTED   Streptococcus agalactiae NOT DETECTED NOT DETECTED   Streptococcus pneumoniae NOT DETECTED NOT DETECTED   Streptococcus pyogenes NOT DETECTED NOT DETECTED   A.calcoaceticus-baumannii NOT DETECTED NOT DETECTED   Bacteroides fragilis NOT DETECTED NOT DETECTED   Enterobacterales NOT DETECTED NOT DETECTED   Enterobacter cloacae complex NOT DETECTED NOT DETECTED   Escherichia coli NOT DETECTED NOT DETECTED   Klebsiella aerogenes NOT DETECTED NOT DETECTED   Klebsiella oxytoca NOT DETECTED NOT DETECTED   Klebsiella pneumoniae NOT DETECTED NOT DETECTED   Proteus species NOT DETECTED NOT DETECTED   Salmonella species NOT DETECTED NOT DETECTED   Serratia marcescens NOT DETECTED NOT DETECTED   Haemophilus influenzae NOT DETECTED NOT DETECTED   Neisseria meningitidis NOT DETECTED NOT DETECTED   Pseudomonas aeruginosa NOT  DETECTED NOT DETECTED   Stenotrophomonas maltophilia NOT DETECTED NOT DETECTED   Candida albicans NOT DETECTED NOT DETECTED   Candida auris NOT DETECTED NOT DETECTED   Candida glabrata NOT DETECTED NOT DETECTED   Candida krusei NOT DETECTED NOT DETECTED   Candida parapsilosis NOT DETECTED NOT DETECTED   Candida tropicalis NOT DETECTED NOT DETECTED   Cryptococcus neoformans/gattii NOT DETECTED NOT DETECTED    Loralee Pacas, PharmD, BCPS 10/14/2023  5:48 PM

## 2023-10-14 NOTE — Progress Notes (Signed)
 I have called the phlebotomy multiple times to come and draw the type and screen but they are not picking up the phone.  Notified lab and they said I should on calling. Will try again later.

## 2023-10-14 NOTE — Plan of Care (Signed)
  Problem: Education: Goal: Knowledge of General Education information will improve Description: Including pain rating scale, medication(s)/side effects and non-pharmacologic comfort measures Outcome: Progressing   Problem: Clinical Measurements: Goal: Respiratory complications will improve Outcome: Progressing Goal: Cardiovascular complication will be avoided Outcome: Progressing   Problem: Activity: Goal: Risk for activity intolerance will decrease Outcome: Progressing   Problem: Elimination: Goal: Will not experience complications related to urinary retention Outcome: Progressing   Problem: Pain Managment: Goal: General experience of comfort will improve and/or be controlled Outcome: Progressing   Problem: Education: Goal: Knowledge of disease and its progression will improve Outcome: Progressing   Problem: Clinical Measurements: Goal: Ability to maintain clinical measurements within normal limits will improve Outcome: Not Progressing Goal: Will remain free from infection Outcome: Not Progressing

## 2023-10-14 NOTE — Procedures (Signed)
 Patient was seen on dialysis and the procedure was supervised.  BFR 400  Via AVG BP is  141/68.   Patient appears to be tolerating treatment well. OP HD arranged.  Joshu Furukawa Jaynie Collins 10/14/2023

## 2023-10-14 NOTE — Plan of Care (Signed)
 Nutrition Education Note  RD consulted for new ESRD on HD diet education. Pt currently in i[facility]. RD working remotely. Unable to speak with pt via phone call.  RD has attached "Nutrition For Dialysis" handout from the Academy of [employer] to pt's AVS/Discharge Instructions.  Handout provides information on protein, dairy products, fluids, starches, fruits, vegetables, sodium and added phosphorus. Handout explains why diet restrictions are needed and provides lists of foods to be aware of that are high potassium, sodium, and added phosphorus phosphorus. Handout provides specific recommendations on safer alternatives of these foods.  Handout also discusses importance of protein intake at each meal and snack and provides examples of how to maximize protein intake throughout the day.  Encourage pt to discuss specific diet questions/concerns with RD at HD outpatient facility.  Expect fair compliance.  Current diet order is Renal/Carb Modified with 1200 mL fluid restriction. Patient is consuming approximately 100% of meals at this time. Labs and medications reviewed. No further nutrition interventions warranted at this time. If additional nutrition issues arise, please re-consult RD.   Mertie Clause, MS, RD, LDN Registered Dietitian II Please see AMiON for contact information.

## 2023-10-14 NOTE — Discharge Instructions (Addendum)
 Nutrition For Dialysis  You can enjoy many foods when you have chronic kidney disease. Limiting a few others can also help you be healthy. Follow these tips and those given by your registered dietitian nutritionist (RDN).  Protein: People on dialysis need a lot of protein. Choose 2 to 3 palm-sized portions of protein foods each day. These include fresh lean beef, lamb, veal, wild game, and "natural" chicken, fish, pork, seafood, and [place], or 2 to 3 eggs or egg whites. Beans, edamame, lentils, nut butters, and tofu may be good options in meatless meals. Limit salty processed meats such as bacon, brats, deli meats, ham, hot dogs, and sausage.  Dairy Products: Limit milk, ice cream, pudding, and yogurt to 4 to 8 oz ( to 1 cup) per day or 1 slice of natural cheese (such as cheddar, mozzarella, or Swiss). Or, instead of cow's milk, use unfortified rice, almond, or soy milk. Limit processed cheeses, such as American cheese, Cheez Whiz, [place], boxed macaroni and cheese, and other cheese spreads or sauces with "phos" ingredients.  Breads, Grains, and Starches: Choose whole grain breads, grains, and lower salt snacks. Limit processed foods such as boxed mixes, biscuits, muffins, pancakes, waffles, instant hot cereals, and ready-to-eat baked goods with "phos" ingredients.  Fluids: If you make little urine, you may need to limit how much you drink. Stay within the amount recommended by your dialysis team. This includes all beverages and ice, as well as the fluid in foods such as gelatin, ice cream, popsicles, and soup. Look for beverages without "phos" ingredients.  Fruits: Enjoy 2 to 3 servings per day (a serving is  cup or 1 small fruit): Lower Potassium Apples Applesauce Berries Canned fruit or fruit  cups Clementine Grapes Kumquat Lemon and lime Mandarin orange Pear Pineapple Plum Tangerine Watermelon  Higher Potassium Avocado Banana Dried  fruit Guava Jackfruit Kiwi Mango Melons: cantaloupe or  honeydew Nectarine Orange Papaya Peach Persimmon Plantain Pomegranate Pomelo  Vegetables: Enjoy 2 to 3 servings per day (a serving is 1 cup leafy greens or  cup  fresh, cooked, or canned): Lower Potassium Asparagus Bitter melon Broccoli Cabbage Carrots Cauliflower Celery Corn Cucumber Eggplant Green beans Greens: collard,  mustard, or turnip Kale  Lettuce Okra Onion Peppers Radish Spaghetti squash Sweet or snap peas Turnip  Higher Potassium Artichoke Brussels sprouts Cassava root Celery root (celeriac) Cooked chard Kohlrabi Parsnips Potato Pumpkin Rutabaga Squash: acorn,  butternut, hubbard,  most winter varieties Sweet potatoes or  yams (batata) Taro root Tomatoes or tomato  sauce Zucchini  Salt and Sodium: Choose foods with less than 200 mg sodium per serving. Packaged meals should have less than 600 mg per serving. Do not add salt to foods. Instead, use herbs and spices such as garlic, onion or garlic powder, basil, oregano, paprika, pepper, or thyme. You can also add flavor with lemon, lime, vinegars, or a salt-free seasoning blend like Mrs. Dash.  Added Phosphorus: Limit foods and drinks with added phosphorus (any ingredients with "phos," such as calcium phosphate or phosphoric acid). Fast food, convenience or gas station foods, restaurant meals, and other processed, boxed, or prepared foods are often high in added phosphorus.  If You Have Diabetes: Follow your diabetes meal and snack plan with the above diet changes. Do not use orange juice to treat low blood sugars. It is high in potassium. Instead, choose:  glucose tabs or gel   cup regular cranberry, grape, or apple juice    cup 7-Up or Sprite

## 2023-10-14 NOTE — Progress Notes (Signed)
 The phlebotomist just picked up my call and stated somebody will be coming soon to draw it.

## 2023-10-14 NOTE — Discharge Planning (Signed)
 [facility]  Initial Hemodialysis Orders  Dialysis center: [facility]  Patient's name: Jacqueline [name] DOB: 23-Jan-1981 AKI or ESRD: ESRD  Discharge diagnosis: Severe hypocalcemia 2.   Progression of CKD V to ESRD  Allergies: No Known Allergies  Date of First Dialysis: 10/11/2023 Cause of renal disease: HTN/DMT2  Dialysis Prescription: Dialysis Frequency: T,Th,S Tx duration: 4 hrs BFR: 400 DFR: 600 EDW: 80 kg  Dialyzer: 180NRe UF profile/Sodium modeling?: NA Dialysis Bath: 2.0 K 2.5 Ca  Dialysis access: Access type: AVG Date placed: 05/02/2023 Surgeon: Dr. Hetty Blend Needle gauge: 15  In Center Medications: Heparin Dose: 3000 units  Type: Porcine VDRA: Patient is currently taking Calcitriol 0.5 mcg PO TID. Please notify Dorene Grebe and order weekly calcium levels.  Venofer: 100 mg IV X 10 doses Fe  41 Tsat 23% Ferritin 187 10/11/2023 Mircera: per protocol Next dose due: Rec'd aranesp 100 mcg SQ 10/13/2023  Sensipar: NA  Discharge labs: Hgb: 8.4 K+: 4.4 Ca: 6.5 Corrected Ca+ 7.6 Phos: 3.8 Alb: 2.6 02/29/2025  Please draw routine labs. Additional labs needed: Monthly labs on admission. Weekly calcium levels.  Additional notes/follow-up: Please notify Dorene Grebe that patient is taking calcitriol at home 0.5 mcg PO TID  Alonna Buckler [facility] [phone]

## 2023-10-14 NOTE — Progress Notes (Signed)
 PROGRESS NOTE  Jacqueline [name] ZOX:[id number] DOB: 09-24-1980 DOA: 10/11/2023 PCP: [place], MD  HPI/Recap of past 24 hours: Patient is a 43 year old female, with past medical history significant for an hypertension, hyperlipidemia, type 2 diabetes mellitus, CKD stage V, and congestive heart failure.  Patient was admitted with multiple complaints, including generalized weakness, fevers with Tmax of around 102, URI symptoms, nasal congestion, some nausea/vomiting with some loose stools.  History of sick contacts (2 children).  Worsening of symptoms and shortness of breath/dyspnea with some mild chest discomfort was reported a week prior to presentation.  Patient also had muscle cramps and tingling in both hands and feet and was found to have severe hypocalcemia.  Other labs noted creatinine 7.13, BNP 103.5, troponin WNL x 2, hemoglobin 7.4, baseline around 8.  Influenza, RSV and COVID all negative.  Chest x-ray unremarkable.  Vital signs fairly stable.  Due to severe hypocalcemia patient was transferred to [facility] for nephrology evaluation and further workup.  AV graft was placed on 05/02/2023, and acceptable for use.  Patient is not on hemodialysis.  Outpatient hemodialysis chair has been secured (TTS).  However, patient continues to have fever.  Blood culture result has not been finalized.  Chest x-ray is nonrevealing.  UA revealed moderate leukocytosis and many bacteria.  Urine culture result is pending.  Patient was started on IV Rocephin today.  10/14/2023: Patient seen on hemodialysis.  Patient continues to have fever.  Follow culture results.  Outpatient hemodialysis chair has been arranged (TTS).  Corrected calcium level of 7.6.  Assessment/Plan: Principal Problem:   Hypocalcemia due to chronic kidney disease   Hypocalcemia:  Calcium noted to be 5.7 on admission Nephrology consulted, received calcium gluconate x 2 Corrected calcium level is 7.6 today.     Possible viral  URI Infectious workup Noted sick contacts with kids Noted fevers, Tmax of 102 at home, none since admission COVID/RSV/influenza negative UA/UC ordered and pending collection (patient still makes urine, denies any dysuria) 10/14/2023: Patient continues to have fever.  UA suggestive of possible UTI.  Possible UTI: -Follow cultures. -Continue IV Rocephin that was started earlier today.  Fever: -Etiology uncertain. -Viral panel came back negative. -UA suggestive of likely UTI. -Blood cultures are pending. -Patient continues to have fever. -Continue IV Rocephin.   CKD stage V with progression to ESRD Metabolic acidosis Nephrology on board, initiated HD on 10/11/2023 via AVG CLIP patient to outpatient HD unit Patient still makes urine 10/14/2023: Seen on hemodialysis today.  Stable.     Anemia of CKD Hemoglobin was 6.7 grams per deciliter earlier today.  Repeat hemoglobin level was 8.4 g/dL. Anemia panel with iron 41, TIBC 176, sats 23, ferritin 187 Continue ESA   Heart failure with preserved EF Management with dialysis Echo showed EF of 60 to 65%, no regional wall motion abnormality, moderate left ventricular hypertrophy, grade 1 diastolic dysfunction   Hypertension Continue amlodipine, hydralazine   Diabetes mellitus type 2 Repeat A1c 4.4 Not on any meds, diet controlled   History of morbid obesity status post gastric bypass BMI 30, obesity Continue lifestyle modification      Estimated body mass index is 31.45 kg/m as calculated from the following:   Height as of this encounter: 5\' 4"  (1.626 m).   Weight as of this encounter: 83.1 kg.     Code Status: Full  Family Communication: Cousin at bedside  Disposition Plan: Status is: Inpatient Remains inpatient appropriate because: Level of care  Consultants: Nephrology  Procedures: None  Antimicrobials: None  DVT prophylaxis: Heparin   Objective: Vitals:   10/14/23 1530 10/14/23 1548 10/14/23  1611 10/14/23 1637  BP: (!) 147/74 (!) 158/78 (!) 142/71 (!) 179/88  Pulse: (!) 111 (!) 111 (!) 108   Resp: 17 15 18 20   Temp:  100.3 F (37.9 C)  (!) 101.5 F (38.6 C)  TempSrc:    Oral  SpO2: 96% 100% 100% 100%  Weight:      Height:        Intake/Output Summary (Last 24 hours) at 10/14/2023 1708 Last data filed at 10/14/2023 0700 Gross per 24 hour  Intake 440 ml  Output 500 ml  Net -60 ml   Filed Weights   10/13/23 0433 10/14/23 0405 10/14/23 0941  Weight: 84.1 kg 79.9 kg 83.1 kg    Exam: General: Awake and alert.  Actively any distress. Cardiovascular: S1, S2  Respiratory: Clear to auscultation. Abdomen: Soft, nontender, nondistended, bowel sounds present Extremities: No leg edema. Neuro: Awake and alert.  Moves all extremities.    Data Reviewed: CBC: Recent Labs  Lab 10/11/23 0920 10/12/23 0319 10/13/23 0259 10/14/23 0223 10/14/23 0755  WBC 7.0 6.0 6.7 8.7 8.4  NEUTROABS  --  4.2 4.5 5.6  --   HGB 7.4* 7.1* 7.4* 6.7* 8.4*  HCT 22.5* 22.1* 23.2* 20.4* 26.2*  MCV 90.4 91.7 94.7 93.6 94.6  PLT 215 210 208 214 265   Basic Metabolic Panel: Recent Labs  Lab 10/11/23 0920 10/11/23 1533 10/11/23 1930 10/12/23 0319 10/13/23 0259 10/13/23 1350 10/14/23 0223 10/14/23 0755  NA 140   < >  --  138 139 138 139 138  K 3.7   < >  --  3.7 4.1 4.0 4.1 4.4  CL 111   < >  --  107 107 104 108 106  CO2 18*   < >  --  21* 27 27 22  21*  GLUCOSE 82   < >  --  211* 115* 170* 162* 116*  BUN 60*   < >  --  36* 28* 31* 38* 40*  CREATININE 7.13*   < >  --  5.11* 3.95* 4.48* 5.08* 5.25*  CALCIUM 5.7*   < >  --  6.4* 6.2* 6.7* 6.1* 6.5*  MG 1.9  --   --   --   --   --   --   --   PHOS  --   --  4.9* 3.5 3.7  --  3.6 3.8   < > = values in this interval not displayed.   GFR: Estimated Creatinine Clearance: 14.4 mL/min (A) (by C-G formula based on SCr of 5.25 mg/dL (H)). Liver Function Tests: Recent Labs  Lab 10/11/23 1533 10/12/23 0319 10/13/23 0259 10/14/23 0223  10/14/23 0755  ALBUMIN 2.8* 2.5* 2.3* 2.2* 2.6*   No results for input(s): "LIPASE", "AMYLASE" in the last 168 hours. No results for input(s): "AMMONIA" in the last 168 hours. Coagulation Profile: No results for input(s): "INR", "PROTIME" in the last 168 hours. Cardiac Enzymes: No results for input(s): "CKTOTAL", "CKMB", "CKMBINDEX", "TROPONINI" in the last 168 hours. BNP (last 3 results) No results for input(s): "PROBNP" in the last 8760 hours. HbA1C: Recent Labs    10/12/23 0319  HGBA1C 4.4*   CBG: Recent Labs  Lab 10/12/23 2139  GLUCAP 199*   Lipid Profile: No results for input(s): "CHOL", "HDL", "LDLCALC", "TRIG", "CHOLHDL", "LDLDIRECT" in the last 72 hours. Thyroid Function Tests: No results for  input(s): "TSH", "T4TOTAL", "FREET4", "T3FREE", "THYROIDAB" in the last 72 hours. Anemia Panel: Recent Labs    10/11/23 1930  FERRITIN 187  TIBC 176*  IRON 41   Urine analysis:    Component Value Date/Time   COLORURINE YELLOW 10/13/2023 1728   APPEARANCEUR HAZY (A) 10/13/2023 1728   LABSPEC 1.014 10/13/2023 1728   PHURINE 7.0 10/13/2023 1728   GLUCOSEU 150 (A) 10/13/2023 1728   HGBUR SMALL (A) 10/13/2023 1728   BILIRUBINUR NEGATIVE 10/13/2023 1728   KETONESUR NEGATIVE 10/13/2023 1728   PROTEINUR >=300 (A) 10/13/2023 1728   UROBILINOGEN 0.2 08/21/2012 2130   NITRITE NEGATIVE 10/13/2023 1728   LEUKOCYTESUR MODERATE (A) 10/13/2023 1728   Sepsis Labs: @LABRCNTIP (procalcitonin:4,lacticidven:4)  ) Recent Results (from the past 240 hours)  Resp panel by RT-PCR (RSV, Flu A&B, Covid) Anterior Nasal Swab     Status: None   Collection Time: 10/11/23  8:01 AM   Specimen: Anterior Nasal Swab  Result Value Ref Range Status   SARS Coronavirus 2 by RT PCR NEGATIVE NEGATIVE Final    Comment: (NOTE) SARS-CoV-2 target nucleic acids are NOT DETECTED.  The SARS-CoV-2 RNA is generally detectable in upper respiratory specimens during the acute phase of infection. The  lowest concentration of SARS-CoV-2 viral copies this assay can detect is 138 copies/mL. A negative result does not preclude SARS-Cov-2 infection and should not be used as the sole basis for treatment or other patient management decisions. A negative result may occur with  improper specimen collection/handling, submission of specimen other than nasopharyngeal swab, presence of viral mutation(s) within the areas targeted by this assay, and inadequate number of viral copies(<138 copies/mL). A negative result must be combined with clinical observations, patient history, and epidemiological information. The expected result is Negative.  Fact Sheet for Patients:  BloggerCourse.com  Fact Sheet for Healthcare Providers:  SeriousBroker.it  This test is no t yet approved or cleared by the [place] FDA and  has been authorized for detection and/or diagnosis of SARS-CoV-2 by FDA under an Emergency Use Authorization (EUA). This EUA will remain  in effect (meaning this test can be used) for the duration of the COVID-19 declaration under Section 564(b)(1) of the Act, 21 U.S.C.section 360bbb-3(b)(1), unless the authorization is terminated  or revoked sooner.       Influenza A by PCR NEGATIVE NEGATIVE Final   Influenza B by PCR NEGATIVE NEGATIVE Final    Comment: (NOTE) The Xpert Xpress SARS-CoV-2/FLU/RSV plus assay is intended as an aid in the diagnosis of influenza from Nasopharyngeal swab specimens and should not be used as a sole basis for treatment. Nasal washings and aspirates are unacceptable for Xpert Xpress SARS-CoV-2/FLU/RSV testing.  Fact Sheet for Patients: BloggerCourse.com  Fact Sheet for Healthcare Providers: SeriousBroker.it  This test is not yet approved or cleared by the [place] FDA and has been authorized for detection and/or diagnosis of SARS-CoV-2 by FDA under  an Emergency Use Authorization (EUA). This EUA will remain in effect (meaning this test can be used) for the duration of the COVID-19 declaration under Section 564(b)(1) of the Act, 21 U.S.C. section 360bbb-3(b)(1), unless the authorization is terminated or revoked.     Resp Syncytial Virus by PCR NEGATIVE NEGATIVE Final    Comment: (NOTE) Fact Sheet for Patients: BloggerCourse.com  Fact Sheet for Healthcare Providers: SeriousBroker.it  This test is not yet approved or cleared by the [place] FDA and has been authorized for detection and/or diagnosis of SARS-CoV-2 by FDA under an Emergency Use Authorization (EUA).  This EUA will remain in effect (meaning this test can be used) for the duration of the COVID-19 declaration under Section 564(b)(1) of the Act, 21 U.S.C. section 360bbb-3(b)(1), unless the authorization is terminated or revoked.  Performed at [facility], [address] [place], [place] [address]   Culture, blood (Routine X 2) w Reflex to ID Panel     Status: None (Preliminary result)   Collection Time: 10/13/23  6:53 PM   Specimen: BLOOD  Result Value Ref Range Status   Specimen Description BLOOD SITE NOT SPECIFIED  Final   Special Requests   Final    BOTTLES DRAWN AEROBIC AND ANAEROBIC Blood Culture results may not be optimal due to an inadequate volume of blood received in culture bottles   Culture  Setup Time   Final    GRAM POSITIVE COCCI IN CHAINS ANAEROBIC BOTTLE ONLY Organism ID to follow Performed at [facility], [address], [place], [place] [address]    Culture PENDING  Incomplete   Report Status PENDING  Incomplete  Culture, blood (Routine X 2) w Reflex to ID Panel     Status: None (Preliminary result)   Collection Time: 10/13/23  7:02 PM   Specimen: BLOOD  Result Value Ref Range Status   Specimen Description BLOOD SITE NOT SPECIFIED  Final   Special Requests   Final     BOTTLES DRAWN AEROBIC ONLY Blood Culture results may not be optimal due to an inadequate volume of blood received in culture bottles   Culture   Final    NO GROWTH < 12 HOURS Performed at [facility], [address] [place], [place] [address]    Report Status PENDING  Incomplete      Studies: DG CHEST PORT 1 VIEW Result Date: 10/13/2023 CLINICAL DATA:  Fever. EXAM: PORTABLE CHEST 1 VIEW COMPARISON:  October 11, 2023. FINDINGS: Stable cardiomediastinal silhouette. Both lungs are clear. The visualized skeletal structures are unremarkable. IMPRESSION: No active disease. Electronically Signed   By: Lupita Raider M.D.   On: 10/13/2023 20:03     Scheduled Meds:  sodium chloride   Intravenous Once   amLODipine  10 mg Oral Daily   calcitRIOL  0.5 mcg Oral TID   Chlorhexidine Gluconate Cloth  6 each Topical Q0600   darbepoetin (ARANESP) injection - NON-DIALYSIS  100 mcg Subcutaneous Q Fri-1800   gabapentin  300 mg Oral QHS   hydrALAZINE  50 mg Oral TID    Continuous Infusions:  cefTRIAXone (ROCEPHIN)  IV 1 g (10/14/23 0030)   promethazine (PHENERGAN) injection (IM or IVPB)       LOS: 3 days   Time spent: 35 minutes.  Barnetta Chapel, MD [facility]  If 7PM-7AM, please contact night-coverage [email or web address] 10/14/2023, 5:08 PM

## 2023-10-14 NOTE — Progress Notes (Signed)
 Hypocalcemia - Corrected calcium with low albumin is 7.5.  Replating with 2 g of calcium gluconate  Acute on chronic anemia Anemia of chronic disease due to CKD stage V -Baseline hemoglobin is around 7.4-8.8 however hemoglobin today is 5.7.  During this admission hemoglobin of 7.4 and it was 8.82 months ago.  Normal MCV.  Low hematocrit. -Concern for development of acute on chronic anemia due to recurrent blood draw as well as progression of the CKD. - Obtaining type and screen and transfusing 1 unit of blood. -Will check FOBT. -Holding IV Lovenox for DVT prophylaxis in the setting of low hemoglobin and high risk of bleeding.  Continue SCD and TED hose.  Tereasa Coop, MD [facility] 10/14/2023, 4:04 AM

## 2023-10-14 NOTE — Progress Notes (Signed)
   10/14/23 1548  Vitals  Temp 100.3 F (37.9 C)  Pulse Rate (!) 111  Resp 15  BP (!) 158/78  SpO2 100 %  O2 Device Room Air  Oxygen Therapy  Patient Activity (if Appropriate) In bed  Pulse Oximetry Type Continuous  Oximetry Probe Site Changed No  During Treatment Monitoring  Blood Flow Rate (mL/min) 199 mL/min  Arterial Pressure (mmHg) -89.29 mmHg  Venous Pressure (mmHg) 145.04 mmHg  TMP (mmHg) 47.27 mmHg  Ultrafiltration Rate (mL/min) 2000 mL/min  Dialysate Flow Rate (mL/min) 300 ml/min  Dialysate Potassium Concentration 3  Dialysate Calcium Concentration 2.5  Duration of HD Treatment -hour(s) 3.5 hour(s)  Cumulative Fluid Removed (mL) per Treatment  1992.4  HD Safety Checks Performed Yes  Intra-Hemodialysis Comments Tx completed   Received patient in bed to unit.  Alert and oriented.  Informed consent signed and in chart.   TX duration: 3.5 hours  Patient tolerated well.  Transported back to the room  Alert, without acute distress.  Hand-off given to patient's nurse.   Access used: RAVF Access issues: Slow to run  Total UF removed: 2000 Medication(s) given: None    Mar Daring, LPN  [facility]

## 2023-10-14 NOTE — Progress Notes (Signed)
 The lab called the charge RN while I was on break to report  her Ca level of 6.1 and Hbg  of 6.7. Notified Dr. Janalyn Shy. See Epic for new orders.

## 2023-10-15 DIAGNOSIS — N189 Chronic kidney disease, unspecified: Secondary | ICD-10-CM | POA: Diagnosis not present

## 2023-10-15 DIAGNOSIS — R7881 Bacteremia: Secondary | ICD-10-CM | POA: Diagnosis not present

## 2023-10-15 DIAGNOSIS — D631 Anemia in chronic kidney disease: Secondary | ICD-10-CM | POA: Diagnosis not present

## 2023-10-15 DIAGNOSIS — N186 End stage renal disease: Secondary | ICD-10-CM | POA: Diagnosis not present

## 2023-10-15 DIAGNOSIS — B954 Other streptococcus as the cause of diseases classified elsewhere: Secondary | ICD-10-CM | POA: Diagnosis not present

## 2023-10-15 DIAGNOSIS — E872 Acidosis, unspecified: Secondary | ICD-10-CM | POA: Diagnosis not present

## 2023-10-15 DIAGNOSIS — I132 Hypertensive heart and chronic kidney disease with heart failure and with stage 5 chronic kidney disease, or end stage renal disease: Secondary | ICD-10-CM | POA: Diagnosis not present

## 2023-10-15 DIAGNOSIS — Z992 Dependence on renal dialysis: Secondary | ICD-10-CM | POA: Diagnosis not present

## 2023-10-15 DIAGNOSIS — I5030 Unspecified diastolic (congestive) heart failure: Secondary | ICD-10-CM | POA: Diagnosis not present

## 2023-10-15 LAB — RENAL FUNCTION PANEL
Albumin: 2.1 g/dL — ABNORMAL LOW (ref 3.5–5.0)
Anion gap: 8 (ref 5–15)
BUN: 27 mg/dL — ABNORMAL HIGH (ref 6–20)
CO2: 24 mmol/L (ref 22–32)
Calcium: 6.6 mg/dL — ABNORMAL LOW (ref 8.9–10.3)
Chloride: 106 mmol/L (ref 98–111)
Creatinine, Ser: 4.05 mg/dL — ABNORMAL HIGH (ref 0.44–1.00)
GFR, Estimated: 13 mL/min — ABNORMAL LOW (ref >60)
Glucose, Bld: 93 mg/dL (ref 70–99)
Phosphorus: 3.6 mg/dL (ref 2.5–4.6)
Potassium: 3.9 mmol/L (ref 3.5–5.1)
Sodium: 138 mmol/L (ref 135–145)

## 2023-10-15 LAB — CBC WITH DIFFERENTIAL/PLATELET
Abs Immature Granulocytes: 0.05 10*3/uL (ref 0.00–0.07)
Basophils Absolute: 0 10*3/uL (ref 0.0–0.1)
Basophils Relative: 0 %
Eosinophils Absolute: 0.3 10*3/uL (ref 0.0–0.5)
Eosinophils Relative: 4 %
HCT: 24.7 % — ABNORMAL LOW (ref 36.0–46.0)
Hemoglobin: 7.9 g/dL — ABNORMAL LOW (ref 12.0–15.0)
Immature Granulocytes: 1 %
Lymphocytes Relative: 15 %
Lymphs Abs: 1.1 10*3/uL (ref 0.7–4.0)
MCH: 30.3 pg (ref 26.0–34.0)
MCHC: 32 g/dL (ref 30.0–36.0)
MCV: 94.6 fL (ref 80.0–100.0)
Monocytes Absolute: 0.7 10*3/uL (ref 0.1–1.0)
Monocytes Relative: 9 %
Neutro Abs: 5.5 10*3/uL (ref 1.7–7.7)
Neutrophils Relative %: 71 %
Platelets: 225 10*3/uL (ref 150–400)
RBC: 2.61 MIL/uL — ABNORMAL LOW (ref 3.87–5.11)
RDW: 13.8 % (ref 11.5–15.5)
WBC: 7.7 10*3/uL (ref 4.0–10.5)
nRBC: 0 % (ref 0.0–0.2)

## 2023-10-15 LAB — CULTURE, OB URINE: Culture: <10000 — AB

## 2023-10-15 MED ORDER — METOPROLOL TARTRATE 25 MG PO TABS
25.0000 mg | ORAL_TABLET | Freq: Two times a day (BID) | ORAL | Status: DC
  Administered 2023-10-15 – 2023-10-20 (×10): 25 mg via ORAL
  Filled 2023-10-15 (×10): qty 1

## 2023-10-15 NOTE — H&P (View-Only) (Signed)
 PROGRESS NOTE  Jacqueline [name] ZOX:[id number] DOB: March 10, 1981 DOA: 10/11/2023 PCP: [place], MD  HPI/Recap of past 24 hours: Patient is a 43 year old female, with past medical history significant for an hypertension, hyperlipidemia, type 2 diabetes mellitus, CKD stage V, and congestive heart failure.  Patient was admitted with multiple complaints, including generalized weakness, fevers with Tmax of around 102, URI symptoms, nasal congestion, some nausea/vomiting with some loose stools.  History of sick contacts (2 children).  Worsening of symptoms and shortness of breath/dyspnea with some mild chest discomfort was reported a week prior to presentation.  Patient also had muscle cramps and tingling in both hands and feet and was found to have severe hypocalcemia.  Other labs noted creatinine 7.13, BNP 103.5, troponin WNL x 2, hemoglobin 7.4, baseline around 8.  Influenza, RSV and COVID all negative.  Chest x-ray unremarkable.  Vital signs fairly stable.  Due to severe hypocalcemia patient was transferred to [facility] for nephrology evaluation and further workup.  AV graft was placed on 05/02/2023, and acceptable for use.  Patient is not on hemodialysis.  Outpatient hemodialysis chair has been secured (TTS).  However, patient continues to have fever.  Blood culture result has not been finalized.  Chest x-ray is nonrevealing.  UA revealed moderate leukocytosis and many bacteria.  Urine culture result is pending.  Patient was started on IV Rocephin today.  10/14/2023: Patient seen on hemodialysis.  Patient continues to have fever.  Follow culture results.  Outpatient hemodialysis chair has been arranged (TTS).  Corrected calcium level of 7.6.  10/15/2023: Fever is improving.  Patient is better today.  Infectious disease team input is highly appreciated.  TEE has been recommended.  Cardiology team consulted.  Hopefully, patient will proceed with TEE on Tuesday (2 days  time).  Assessment/Plan: Principal Problem:   Hypocalcemia due to chronic kidney disease   Hypocalcemia:  Calcium noted to be 5.7 on admission Nephrology consulted, received calcium gluconate x 2 Corrected calcium level is 7.7 today.     Fever: -Blood culture grew Streptococcus. -ID team has recommended TEE. -Cardiology team has been consulted.  Possible TEE on Tuesday. -Noted sick contacts with kids -COVID/RSV/influenza negative -Fevers is improving. -Continue IV Rocephin.  Possible UTI: -Urine culture grew less than 10,000 colonies per mL..   CKD stage V with progression to ESRD Metabolic acidosis Nephrology on board, initiated HD on 10/11/2023 via AVG CLIP patient to outpatient HD unit Patient still makes urine 10/14/2023: Seen on hemodialysis today.  Stable.     Anemia of CKD Hemoglobin was 6.7 grams per deciliter earlier today.  Repeat hemoglobin level was 8.4 g/dL. Anemia panel with iron 41, TIBC 176, sats 23, ferritin 187 Continue ESA   Heart failure with preserved EF Management with dialysis Echo showed EF of 60 to 65%, no regional wall motion abnormality, moderate left ventricular hypertrophy, grade 1 diastolic dysfunction   Hypertension Continue amlodipine, hydralazine   Diabetes mellitus type 2 Repeat A1c 4.4 Not on any meds, diet controlled   History of morbid obesity status post gastric bypass BMI 30, obesity Continue lifestyle modification      Estimated body mass index is 29.78 kg/m as calculated from the following:   Height as of this encounter: 5\' 4"  (1.626 m).   Weight as of this encounter: 78.7 kg.     Code Status: Full  Family Communication: Cousin at bedside  Disposition Plan: Status is: Inpatient Remains inpatient appropriate because: Level of care  Consultants: Nephrology  Procedures: None  Antimicrobials: None  DVT prophylaxis: Heparin   Objective: Vitals:   10/14/23 2102 10/15/23 0007 10/15/23 0515  10/15/23 0733  BP: (!) 122/59 112/61 131/69 138/77  Pulse:   95 (!) 105  Resp:  17 19 20   Temp:  98.9 F (37.2 C) 98.9 F (37.2 C) 99.2 F (37.3 C)  TempSrc:  Oral Oral Oral  SpO2:  100% 96% 100%  Weight:   78.7 kg   Height:        Intake/Output Summary (Last 24 hours) at 10/15/2023 1025 Last data filed at 10/15/2023 0840 Gross per 24 hour  Intake 567.4 ml  Output 2000 ml  Net -1432.6 ml   Filed Weights   10/14/23 0405 10/14/23 0941 10/15/23 0515  Weight: 79.9 kg 83.1 kg 78.7 kg    Exam: General: Awake and alert.  Actively any distress. Cardiovascular: S1, S2  Respiratory: Clear to auscultation. Abdomen: Soft, nontender, nondistended, bowel sounds present Extremities: No leg edema. Neuro: Awake and alert.  Moves all extremities.    Data Reviewed: CBC: Recent Labs  Lab 10/11/23 0920 10/12/23 0319 10/13/23 0259 10/14/23 0223 10/14/23 0755  WBC 7.0 6.0 6.7 8.7 8.4  NEUTROABS  --  4.2 4.5 5.6  --   HGB 7.4* 7.1* 7.4* 6.7* 8.4*  HCT 22.5* 22.1* 23.2* 20.4* 26.2*  MCV 90.4 91.7 94.7 93.6 94.6  PLT 215 210 208 214 265   Basic Metabolic Panel: Recent Labs  Lab 10/11/23 0920 10/11/23 1533 10/12/23 0319 10/13/23 0259 10/13/23 1350 10/14/23 0223 10/14/23 0755 10/15/23 0253  NA 140   < > 138 139 138 139 138 138  K 3.7   < > 3.7 4.1 4.0 4.1 4.4 3.9  CL 111   < > 107 107 104 108 106 106  CO2 18*   < > 21* 27 27 22  21* 24  GLUCOSE 82   < > 211* 115* 170* 162* 116* 93  BUN 60*   < > 36* 28* 31* 38* 40* 27*  CREATININE 7.13*   < > 5.11* 3.95* 4.48* 5.08* 5.25* 4.05*  CALCIUM 5.7*   < > 6.4* 6.2* 6.7* 6.1* 6.5* 6.6*  MG 1.9  --   --   --   --   --   --   --   PHOS  --    < > 3.5 3.7  --  3.6 3.8 3.6   < > = values in this interval not displayed.   GFR: Estimated Creatinine Clearance: 18.2 mL/min (A) (by C-G formula based on SCr of 4.05 mg/dL (H)). Liver Function Tests: Recent Labs  Lab 10/12/23 0319 10/13/23 0259 10/14/23 0223 10/14/23 0755  10/15/23 0253  ALBUMIN 2.5* 2.3* 2.2* 2.6* 2.1*   No results for input(s): "LIPASE", "AMYLASE" in the last 168 hours. No results for input(s): "AMMONIA" in the last 168 hours. Coagulation Profile: No results for input(s): "INR", "PROTIME" in the last 168 hours. Cardiac Enzymes: No results for input(s): "CKTOTAL", "CKMB", "CKMBINDEX", "TROPONINI" in the last 168 hours. BNP (last 3 results) No results for input(s): "PROBNP" in the last 8760 hours. HbA1C: No results for input(s): "HGBA1C" in the last 72 hours.  CBG: Recent Labs  Lab 10/12/23 2139  GLUCAP 199*   Lipid Profile: No results for input(s): "CHOL", "HDL", "LDLCALC", "TRIG", "CHOLHDL", "LDLDIRECT" in the last 72 hours. Thyroid Function Tests: No results for input(s): "TSH", "T4TOTAL", "FREET4", "T3FREE", "THYROIDAB" in the last 72 hours. Anemia Panel: No results for input(s): "VITAMINB12", "  FOLATE", "FERRITIN", "TIBC", "IRON", "RETICCTPCT" in the last 72 hours.  Urine analysis:    Component Value Date/Time   COLORURINE YELLOW 10/13/2023 1728   APPEARANCEUR HAZY (A) 10/13/2023 1728   LABSPEC 1.014 10/13/2023 1728   PHURINE 7.0 10/13/2023 1728   GLUCOSEU 150 (A) 10/13/2023 1728   HGBUR SMALL (A) 10/13/2023 1728   BILIRUBINUR NEGATIVE 10/13/2023 1728   KETONESUR NEGATIVE 10/13/2023 1728   PROTEINUR >=300 (A) 10/13/2023 1728   UROBILINOGEN 0.2 08/21/2012 2130   NITRITE NEGATIVE 10/13/2023 1728   LEUKOCYTESUR MODERATE (A) 10/13/2023 1728   Sepsis Labs: @LABRCNTIP (procalcitonin:4,lacticidven:4)  ) Recent Results (from the past 240 hours)  Resp panel by RT-PCR (RSV, Flu A&B, Covid) Anterior Nasal Swab     Status: None   Collection Time: 10/11/23  8:01 AM   Specimen: Anterior Nasal Swab  Result Value Ref Range Status   SARS Coronavirus 2 by RT PCR NEGATIVE NEGATIVE Final    Comment: (NOTE) SARS-CoV-2 target nucleic acids are NOT DETECTED.  The SARS-CoV-2 RNA is generally detectable in upper  respiratory specimens during the acute phase of infection. The lowest concentration of SARS-CoV-2 viral copies this assay can detect is 138 copies/mL. A negative result does not preclude SARS-Cov-2 infection and should not be used as the sole basis for treatment or other patient management decisions. A negative result may occur with  improper specimen collection/handling, submission of specimen other than nasopharyngeal swab, presence of viral mutation(s) within the areas targeted by this assay, and inadequate number of viral copies(<138 copies/mL). A negative result must be combined with clinical observations, patient history, and epidemiological information. The expected result is Negative.  Fact Sheet for Patients:  BloggerCourse.com  Fact Sheet for Healthcare Providers:  SeriousBroker.it  This test is no t yet approved or cleared by the [place] FDA and  has been authorized for detection and/or diagnosis of SARS-CoV-2 by FDA under an Emergency Use Authorization (EUA). This EUA will remain  in effect (meaning this test can be used) for the duration of the COVID-19 declaration under Section 564(b)(1) of the Act, 21 U.S.C.section 360bbb-3(b)(1), unless the authorization is terminated  or revoked sooner.       Influenza A by PCR NEGATIVE NEGATIVE Final   Influenza B by PCR NEGATIVE NEGATIVE Final    Comment: (NOTE) The Xpert Xpress SARS-CoV-2/FLU/RSV plus assay is intended as an aid in the diagnosis of influenza from Nasopharyngeal swab specimens and should not be used as a sole basis for treatment. Nasal washings and aspirates are unacceptable for Xpert Xpress SARS-CoV-2/FLU/RSV testing.  Fact Sheet for Patients: BloggerCourse.com  Fact Sheet for Healthcare Providers: SeriousBroker.it  This test is not yet approved or cleared by the [place] FDA and has been  authorized for detection and/or diagnosis of SARS-CoV-2 by FDA under an Emergency Use Authorization (EUA). This EUA will remain in effect (meaning this test can be used) for the duration of the COVID-19 declaration under Section 564(b)(1) of the Act, 21 U.S.C. section 360bbb-3(b)(1), unless the authorization is terminated or revoked.     Resp Syncytial Virus by PCR NEGATIVE NEGATIVE Final    Comment: (NOTE) Fact Sheet for Patients: BloggerCourse.com  Fact Sheet for Healthcare Providers: SeriousBroker.it  This test is not yet approved or cleared by the [place] FDA and has been authorized for detection and/or diagnosis of SARS-CoV-2 by FDA under an Emergency Use Authorization (EUA). This EUA will remain in effect (meaning this test can be used) for the duration of the COVID-19 declaration under  Section 564(b)(1) of the Act, 21 U.S.C. section 360bbb-3(b)(1), unless the authorization is terminated or revoked.  Performed at [facility], [address] [place], [place] [address]   Culture, blood (Routine X 2) w Reflex to ID Panel     Status: None (Preliminary result)   Collection Time: 10/13/23  6:53 PM   Specimen: BLOOD  Result Value Ref Range Status   Specimen Description BLOOD SITE NOT SPECIFIED  Final   Special Requests   Final    BOTTLES DRAWN AEROBIC AND ANAEROBIC Blood Culture results may not be optimal due to an inadequate volume of blood received in culture bottles   Culture  Setup Time   Final    GRAM POSITIVE COCCI IN CHAINS IN BOTH AEROBIC AND ANAEROBIC BOTTLES CRITICAL RESULT CALLED TO, READ BACK BY AND VERIFIED WITH: PHARMD CAREN AMEND [id number] BY J RAZZAK,MT Performed at [facility], [address], [place], [place] [address]    Culture GRAM POSITIVE COCCI IN CHAINS  Final   Report Status PENDING  Incomplete  Blood Culture ID Panel (Reflexed)     Status: Abnormal   Collection Time:  10/13/23  6:53 PM  Result Value Ref Range Status   Enterococcus faecalis NOT DETECTED NOT DETECTED Final   Enterococcus Faecium NOT DETECTED NOT DETECTED Final   Listeria monocytogenes NOT DETECTED NOT DETECTED Final   Staphylococcus species NOT DETECTED NOT DETECTED Final   Staphylococcus aureus (BCID) NOT DETECTED NOT DETECTED Final   Staphylococcus epidermidis NOT DETECTED NOT DETECTED Final   Staphylococcus lugdunensis NOT DETECTED NOT DETECTED Final   Streptococcus species DETECTED (A) NOT DETECTED Final    Comment: Not Enterococcus species, Streptococcus agalactiae, Streptococcus pyogenes, or Streptococcus pneumoniae. CRITICAL RESULT CALLED TO, READ BACK BY AND VERIFIED WITH: PHARMD CAREN AMEND [id number] BY J RAZZAK, MT    Streptococcus agalactiae NOT DETECTED NOT DETECTED Final   Streptococcus pneumoniae NOT DETECTED NOT DETECTED Final   Streptococcus pyogenes NOT DETECTED NOT DETECTED Final   A.calcoaceticus-baumannii NOT DETECTED NOT DETECTED Final   Bacteroides fragilis NOT DETECTED NOT DETECTED Final   Enterobacterales NOT DETECTED NOT DETECTED Final   Enterobacter cloacae complex NOT DETECTED NOT DETECTED Final   Escherichia coli NOT DETECTED NOT DETECTED Final   Klebsiella aerogenes NOT DETECTED NOT DETECTED Final   Klebsiella oxytoca NOT DETECTED NOT DETECTED Final   Klebsiella pneumoniae NOT DETECTED NOT DETECTED Final   Proteus species NOT DETECTED NOT DETECTED Final   Salmonella species NOT DETECTED NOT DETECTED Final   Serratia marcescens NOT DETECTED NOT DETECTED Final   Haemophilus influenzae NOT DETECTED NOT DETECTED Final   Neisseria meningitidis NOT DETECTED NOT DETECTED Final   Pseudomonas aeruginosa NOT DETECTED NOT DETECTED Final   Stenotrophomonas maltophilia NOT DETECTED NOT DETECTED Final   Candida albicans NOT DETECTED NOT DETECTED Final   Candida auris NOT DETECTED NOT DETECTED Final   Candida glabrata NOT DETECTED NOT DETECTED Final   Candida  krusei NOT DETECTED NOT DETECTED Final   Candida parapsilosis NOT DETECTED NOT DETECTED Final   Candida tropicalis NOT DETECTED NOT DETECTED Final   Cryptococcus neoformans/gattii NOT DETECTED NOT DETECTED Final    Comment: Performed at [facility], [address] [place], [place] [address]  Culture, blood (Routine X 2) w Reflex to ID Panel     Status: None (Preliminary result)   Collection Time: 10/13/23  7:02 PM   Specimen: BLOOD  Result Value Ref Range Status   Specimen  Description BLOOD SITE NOT SPECIFIED  Final   Special Requests   Final    BOTTLES DRAWN AEROBIC ONLY Blood Culture results may not be optimal due to an inadequate volume of blood received in culture bottles   Culture  Setup Time   Final    GRAM POSITIVE COCCI IN CHAINS AEROBIC BOTTLE ONLY CRITICAL VALUE NOTED.  VALUE IS CONSISTENT WITH PREVIOUSLY REPORTED AND CALLED VALUE. Performed at [facility], [address] [place], [place] [address]    Culture [facility] POSITIVE COCCI  Final   Report Status PENDING  Incomplete      Studies: No results found.    Scheduled Meds:  sodium chloride   Intravenous Once   amLODipine  10 mg Oral Daily   calcitRIOL  0.5 mcg Oral TID   Chlorhexidine Gluconate Cloth  6 each Topical Q0600   darbepoetin (ARANESP) injection - NON-DIALYSIS  100 mcg Subcutaneous Q Fri-1800   gabapentin  300 mg Oral QHS   hydrALAZINE  50 mg Oral TID   metoprolol tartrate  25 mg Oral BID    Continuous Infusions:  cefTRIAXone (ROCEPHIN)  IV Stopped (10/14/23 2138)   promethazine (PHENERGAN) injection (IM or IVPB)       LOS: 4 days   Time spent: 35 minutes.  Barnetta Chapel, MD [facility]  If 7PM-7AM, please contact night-coverage [email or web address] 10/15/2023, 10:25 AM

## 2023-10-15 NOTE — Progress Notes (Signed)
 PROGRESS NOTE  Jacqueline [name] ZOX:[id number] DOB: March 10, 1981 DOA: 10/11/2023 PCP: [place], MD  HPI/Recap of past 24 hours: Patient is a 43 year old female, with past medical history significant for an hypertension, hyperlipidemia, type 2 diabetes mellitus, CKD stage V, and congestive heart failure.  Patient was admitted with multiple complaints, including generalized weakness, fevers with Tmax of around 102, URI symptoms, nasal congestion, some nausea/vomiting with some loose stools.  History of sick contacts (2 children).  Worsening of symptoms and shortness of breath/dyspnea with some mild chest discomfort was reported a week prior to presentation.  Patient also had muscle cramps and tingling in both hands and feet and was found to have severe hypocalcemia.  Other labs noted creatinine 7.13, BNP 103.5, troponin WNL x 2, hemoglobin 7.4, baseline around 8.  Influenza, RSV and COVID all negative.  Chest x-ray unremarkable.  Vital signs fairly stable.  Due to severe hypocalcemia patient was transferred to [facility] for nephrology evaluation and further workup.  AV graft was placed on 05/02/2023, and acceptable for use.  Patient is not on hemodialysis.  Outpatient hemodialysis chair has been secured (TTS).  However, patient continues to have fever.  Blood culture result has not been finalized.  Chest x-ray is nonrevealing.  UA revealed moderate leukocytosis and many bacteria.  Urine culture result is pending.  Patient was started on IV Rocephin today.  10/14/2023: Patient seen on hemodialysis.  Patient continues to have fever.  Follow culture results.  Outpatient hemodialysis chair has been arranged (TTS).  Corrected calcium level of 7.6.  10/15/2023: Fever is improving.  Patient is better today.  Infectious disease team input is highly appreciated.  TEE has been recommended.  Cardiology team consulted.  Hopefully, patient will proceed with TEE on Tuesday (2 days  time).  Assessment/Plan: Principal Problem:   Hypocalcemia due to chronic kidney disease   Hypocalcemia:  Calcium noted to be 5.7 on admission Nephrology consulted, received calcium gluconate x 2 Corrected calcium level is 7.7 today.     Fever: -Blood culture grew Streptococcus. -ID team has recommended TEE. -Cardiology team has been consulted.  Possible TEE on Tuesday. -Noted sick contacts with kids -COVID/RSV/influenza negative -Fevers is improving. -Continue IV Rocephin.  Possible UTI: -Urine culture grew less than 10,000 colonies per mL..   CKD stage V with progression to ESRD Metabolic acidosis Nephrology on board, initiated HD on 10/11/2023 via AVG CLIP patient to outpatient HD unit Patient still makes urine 10/14/2023: Seen on hemodialysis today.  Stable.     Anemia of CKD Hemoglobin was 6.7 grams per deciliter earlier today.  Repeat hemoglobin level was 8.4 g/dL. Anemia panel with iron 41, TIBC 176, sats 23, ferritin 187 Continue ESA   Heart failure with preserved EF Management with dialysis Echo showed EF of 60 to 65%, no regional wall motion abnormality, moderate left ventricular hypertrophy, grade 1 diastolic dysfunction   Hypertension Continue amlodipine, hydralazine   Diabetes mellitus type 2 Repeat A1c 4.4 Not on any meds, diet controlled   History of morbid obesity status post gastric bypass BMI 30, obesity Continue lifestyle modification      Estimated body mass index is 29.78 kg/m as calculated from the following:   Height as of this encounter: 5\' 4"  (1.626 m).   Weight as of this encounter: 78.7 kg.     Code Status: Full  Family Communication: Cousin at bedside  Disposition Plan: Status is: Inpatient Remains inpatient appropriate because: Level of care  Consultants: Nephrology  Procedures: None  Antimicrobials: None  DVT prophylaxis: Heparin   Objective: Vitals:   10/14/23 2102 10/15/23 0007 10/15/23 0515  10/15/23 0733  BP: (!) 122/59 112/61 131/69 138/77  Pulse:   95 (!) 105  Resp:  17 19 20   Temp:  98.9 F (37.2 C) 98.9 F (37.2 C) 99.2 F (37.3 C)  TempSrc:  Oral Oral Oral  SpO2:  100% 96% 100%  Weight:   78.7 kg   Height:        Intake/Output Summary (Last 24 hours) at 10/15/2023 1025 Last data filed at 10/15/2023 0840 Gross per 24 hour  Intake 567.4 ml  Output 2000 ml  Net -1432.6 ml   Filed Weights   10/14/23 0405 10/14/23 0941 10/15/23 0515  Weight: 79.9 kg 83.1 kg 78.7 kg    Exam: General: Awake and alert.  Actively any distress. Cardiovascular: S1, S2  Respiratory: Clear to auscultation. Abdomen: Soft, nontender, nondistended, bowel sounds present Extremities: No leg edema. Neuro: Awake and alert.  Moves all extremities.    Data Reviewed: CBC: Recent Labs  Lab 10/11/23 0920 10/12/23 0319 10/13/23 0259 10/14/23 0223 10/14/23 0755  WBC 7.0 6.0 6.7 8.7 8.4  NEUTROABS  --  4.2 4.5 5.6  --   HGB 7.4* 7.1* 7.4* 6.7* 8.4*  HCT 22.5* 22.1* 23.2* 20.4* 26.2*  MCV 90.4 91.7 94.7 93.6 94.6  PLT 215 210 208 214 265   Basic Metabolic Panel: Recent Labs  Lab 10/11/23 0920 10/11/23 1533 10/12/23 0319 10/13/23 0259 10/13/23 1350 10/14/23 0223 10/14/23 0755 10/15/23 0253  NA 140   < > 138 139 138 139 138 138  K 3.7   < > 3.7 4.1 4.0 4.1 4.4 3.9  CL 111   < > 107 107 104 108 106 106  CO2 18*   < > 21* 27 27 22  21* 24  GLUCOSE 82   < > 211* 115* 170* 162* 116* 93  BUN 60*   < > 36* 28* 31* 38* 40* 27*  CREATININE 7.13*   < > 5.11* 3.95* 4.48* 5.08* 5.25* 4.05*  CALCIUM 5.7*   < > 6.4* 6.2* 6.7* 6.1* 6.5* 6.6*  MG 1.9  --   --   --   --   --   --   --   PHOS  --    < > 3.5 3.7  --  3.6 3.8 3.6   < > = values in this interval not displayed.   GFR: Estimated Creatinine Clearance: 18.2 mL/min (A) (by C-G formula based on SCr of 4.05 mg/dL (H)). Liver Function Tests: Recent Labs  Lab 10/12/23 0319 10/13/23 0259 10/14/23 0223 10/14/23 0755  10/15/23 0253  ALBUMIN 2.5* 2.3* 2.2* 2.6* 2.1*   No results for input(s): "LIPASE", "AMYLASE" in the last 168 hours. No results for input(s): "AMMONIA" in the last 168 hours. Coagulation Profile: No results for input(s): "INR", "PROTIME" in the last 168 hours. Cardiac Enzymes: No results for input(s): "CKTOTAL", "CKMB", "CKMBINDEX", "TROPONINI" in the last 168 hours. BNP (last 3 results) No results for input(s): "PROBNP" in the last 8760 hours. HbA1C: No results for input(s): "HGBA1C" in the last 72 hours.  CBG: Recent Labs  Lab 10/12/23 2139  GLUCAP 199*   Lipid Profile: No results for input(s): "CHOL", "HDL", "LDLCALC", "TRIG", "CHOLHDL", "LDLDIRECT" in the last 72 hours. Thyroid Function Tests: No results for input(s): "TSH", "T4TOTAL", "FREET4", "T3FREE", "THYROIDAB" in the last 72 hours. Anemia Panel: No results for input(s): "VITAMINB12", "  FOLATE", "FERRITIN", "TIBC", "IRON", "RETICCTPCT" in the last 72 hours.  Urine analysis:    Component Value Date/Time   COLORURINE YELLOW 10/13/2023 1728   APPEARANCEUR HAZY (A) 10/13/2023 1728   LABSPEC 1.014 10/13/2023 1728   PHURINE 7.0 10/13/2023 1728   GLUCOSEU 150 (A) 10/13/2023 1728   HGBUR SMALL (A) 10/13/2023 1728   BILIRUBINUR NEGATIVE 10/13/2023 1728   KETONESUR NEGATIVE 10/13/2023 1728   PROTEINUR >=300 (A) 10/13/2023 1728   UROBILINOGEN 0.2 08/21/2012 2130   NITRITE NEGATIVE 10/13/2023 1728   LEUKOCYTESUR MODERATE (A) 10/13/2023 1728   Sepsis Labs: @LABRCNTIP (procalcitonin:4,lacticidven:4)  ) Recent Results (from the past 240 hours)  Resp panel by RT-PCR (RSV, Flu A&B, Covid) Anterior Nasal Swab     Status: None   Collection Time: 10/11/23  8:01 AM   Specimen: Anterior Nasal Swab  Result Value Ref Range Status   SARS Coronavirus 2 by RT PCR NEGATIVE NEGATIVE Final    Comment: (NOTE) SARS-CoV-2 target nucleic acids are NOT DETECTED.  The SARS-CoV-2 RNA is generally detectable in upper  respiratory specimens during the acute phase of infection. The lowest concentration of SARS-CoV-2 viral copies this assay can detect is 138 copies/mL. A negative result does not preclude SARS-Cov-2 infection and should not be used as the sole basis for treatment or other patient management decisions. A negative result may occur with  improper specimen collection/handling, submission of specimen other than nasopharyngeal swab, presence of viral mutation(s) within the areas targeted by this assay, and inadequate number of viral copies(<138 copies/mL). A negative result must be combined with clinical observations, patient history, and epidemiological information. The expected result is Negative.  Fact Sheet for Patients:  BloggerCourse.com  Fact Sheet for Healthcare Providers:  SeriousBroker.it  This test is no t yet approved or cleared by the [place] FDA and  has been authorized for detection and/or diagnosis of SARS-CoV-2 by FDA under an Emergency Use Authorization (EUA). This EUA will remain  in effect (meaning this test can be used) for the duration of the COVID-19 declaration under Section 564(b)(1) of the Act, 21 U.S.C.section 360bbb-3(b)(1), unless the authorization is terminated  or revoked sooner.       Influenza A by PCR NEGATIVE NEGATIVE Final   Influenza B by PCR NEGATIVE NEGATIVE Final    Comment: (NOTE) The Xpert Xpress SARS-CoV-2/FLU/RSV plus assay is intended as an aid in the diagnosis of influenza from Nasopharyngeal swab specimens and should not be used as a sole basis for treatment. Nasal washings and aspirates are unacceptable for Xpert Xpress SARS-CoV-2/FLU/RSV testing.  Fact Sheet for Patients: BloggerCourse.com  Fact Sheet for Healthcare Providers: SeriousBroker.it  This test is not yet approved or cleared by the [place] FDA and has been  authorized for detection and/or diagnosis of SARS-CoV-2 by FDA under an Emergency Use Authorization (EUA). This EUA will remain in effect (meaning this test can be used) for the duration of the COVID-19 declaration under Section 564(b)(1) of the Act, 21 U.S.C. section 360bbb-3(b)(1), unless the authorization is terminated or revoked.     Resp Syncytial Virus by PCR NEGATIVE NEGATIVE Final    Comment: (NOTE) Fact Sheet for Patients: BloggerCourse.com  Fact Sheet for Healthcare Providers: SeriousBroker.it  This test is not yet approved or cleared by the [place] FDA and has been authorized for detection and/or diagnosis of SARS-CoV-2 by FDA under an Emergency Use Authorization (EUA). This EUA will remain in effect (meaning this test can be used) for the duration of the COVID-19 declaration under  Section 564(b)(1) of the Act, 21 U.S.C. section 360bbb-3(b)(1), unless the authorization is terminated or revoked.  Performed at [facility], [address] [place], [place] [address]   Culture, blood (Routine X 2) w Reflex to ID Panel     Status: None (Preliminary result)   Collection Time: 10/13/23  6:53 PM   Specimen: BLOOD  Result Value Ref Range Status   Specimen Description BLOOD SITE NOT SPECIFIED  Final   Special Requests   Final    BOTTLES DRAWN AEROBIC AND ANAEROBIC Blood Culture results may not be optimal due to an inadequate volume of blood received in culture bottles   Culture  Setup Time   Final    GRAM POSITIVE COCCI IN CHAINS IN BOTH AEROBIC AND ANAEROBIC BOTTLES CRITICAL RESULT CALLED TO, READ BACK BY AND VERIFIED WITH: PHARMD CAREN AMEND [id number] BY J RAZZAK,MT Performed at [facility], [address], [place], [place] [address]    Culture GRAM POSITIVE COCCI IN CHAINS  Final   Report Status PENDING  Incomplete  Blood Culture ID Panel (Reflexed)     Status: Abnormal   Collection Time:  10/13/23  6:53 PM  Result Value Ref Range Status   Enterococcus faecalis NOT DETECTED NOT DETECTED Final   Enterococcus Faecium NOT DETECTED NOT DETECTED Final   Listeria monocytogenes NOT DETECTED NOT DETECTED Final   Staphylococcus species NOT DETECTED NOT DETECTED Final   Staphylococcus aureus (BCID) NOT DETECTED NOT DETECTED Final   Staphylococcus epidermidis NOT DETECTED NOT DETECTED Final   Staphylococcus lugdunensis NOT DETECTED NOT DETECTED Final   Streptococcus species DETECTED (A) NOT DETECTED Final    Comment: Not Enterococcus species, Streptococcus agalactiae, Streptococcus pyogenes, or Streptococcus pneumoniae. CRITICAL RESULT CALLED TO, READ BACK BY AND VERIFIED WITH: PHARMD CAREN AMEND [id number] BY J RAZZAK, MT    Streptococcus agalactiae NOT DETECTED NOT DETECTED Final   Streptococcus pneumoniae NOT DETECTED NOT DETECTED Final   Streptococcus pyogenes NOT DETECTED NOT DETECTED Final   A.calcoaceticus-baumannii NOT DETECTED NOT DETECTED Final   Bacteroides fragilis NOT DETECTED NOT DETECTED Final   Enterobacterales NOT DETECTED NOT DETECTED Final   Enterobacter cloacae complex NOT DETECTED NOT DETECTED Final   Escherichia coli NOT DETECTED NOT DETECTED Final   Klebsiella aerogenes NOT DETECTED NOT DETECTED Final   Klebsiella oxytoca NOT DETECTED NOT DETECTED Final   Klebsiella pneumoniae NOT DETECTED NOT DETECTED Final   Proteus species NOT DETECTED NOT DETECTED Final   Salmonella species NOT DETECTED NOT DETECTED Final   Serratia marcescens NOT DETECTED NOT DETECTED Final   Haemophilus influenzae NOT DETECTED NOT DETECTED Final   Neisseria meningitidis NOT DETECTED NOT DETECTED Final   Pseudomonas aeruginosa NOT DETECTED NOT DETECTED Final   Stenotrophomonas maltophilia NOT DETECTED NOT DETECTED Final   Candida albicans NOT DETECTED NOT DETECTED Final   Candida auris NOT DETECTED NOT DETECTED Final   Candida glabrata NOT DETECTED NOT DETECTED Final   Candida  krusei NOT DETECTED NOT DETECTED Final   Candida parapsilosis NOT DETECTED NOT DETECTED Final   Candida tropicalis NOT DETECTED NOT DETECTED Final   Cryptococcus neoformans/gattii NOT DETECTED NOT DETECTED Final    Comment: Performed at [facility], [address] [place], [place] [address]  Culture, blood (Routine X 2) w Reflex to ID Panel     Status: None (Preliminary result)   Collection Time: 10/13/23  7:02 PM   Specimen: BLOOD  Result Value Ref Range Status   Specimen  Description BLOOD SITE NOT SPECIFIED  Final   Special Requests   Final    BOTTLES DRAWN AEROBIC ONLY Blood Culture results may not be optimal due to an inadequate volume of blood received in culture bottles   Culture  Setup Time   Final    GRAM POSITIVE COCCI IN CHAINS AEROBIC BOTTLE ONLY CRITICAL VALUE NOTED.  VALUE IS CONSISTENT WITH PREVIOUSLY REPORTED AND CALLED VALUE. Performed at [facility], [address] [place], [place] [address]    Culture [facility] POSITIVE COCCI  Final   Report Status PENDING  Incomplete      Studies: No results found.    Scheduled Meds:  sodium chloride   Intravenous Once   amLODipine  10 mg Oral Daily   calcitRIOL  0.5 mcg Oral TID   Chlorhexidine Gluconate Cloth  6 each Topical Q0600   darbepoetin (ARANESP) injection - NON-DIALYSIS  100 mcg Subcutaneous Q Fri-1800   gabapentin  300 mg Oral QHS   hydrALAZINE  50 mg Oral TID   metoprolol tartrate  25 mg Oral BID    Continuous Infusions:  cefTRIAXone (ROCEPHIN)  IV Stopped (10/14/23 2138)   promethazine (PHENERGAN) injection (IM or IVPB)       LOS: 4 days   Time spent: 35 minutes.  Barnetta Chapel, MD [facility]  If 7PM-7AM, please contact night-coverage [email or web address] 10/15/2023, 10:25 AM

## 2023-10-15 NOTE — Plan of Care (Signed)
   Problem: Education: Goal: Knowledge of General Education information will improve Description: Including pain rating scale, medication(s)/side effects and non-pharmacologic comfort measures Outcome: Progressing   Problem: Health Behavior/Discharge Planning: Goal: Ability to manage health-related needs will improve Outcome: Progressing   Problem: Clinical Measurements: Goal: Ability to maintain clinical measurements within normal limits will improve Outcome: Progressing Goal: Will remain free from infection Outcome: Progressing Goal: Diagnostic test results will improve Outcome: Progressing Goal: Respiratory complications will improve Outcome: Progressing Goal: Cardiovascular complication will be avoided Outcome: Progressing   Problem: Activity: Goal: Risk for activity intolerance will decrease Outcome: Progressing   Problem: Nutrition: Goal: Adequate nutrition will be maintained Outcome: Progressing   Problem: Coping: Goal: Level of anxiety will decrease Outcome: Progressing   Problem: Elimination: Goal: Will not experience complications related to bowel motility Outcome: Progressing Goal: Will not experience complications related to urinary retention Outcome: Progressing   Problem: Pain Managment: Goal: General experience of comfort will improve and/or be controlled Outcome: Progressing   Problem: Safety: Goal: Ability to remain free from injury will improve Outcome: Progressing   Problem: Skin Integrity: Goal: Risk for impaired skin integrity will decrease Outcome: Progressing   Problem: Education: Goal: Knowledge of disease and its progression will improve Outcome: Progressing   Problem: Fluid Volume: Goal: Compliance with measures to maintain balanced fluid volume will improve Outcome: Progressing   Problem: Health Behavior/Discharge Planning: Goal: Ability to manage health-related needs will improve Outcome: Progressing   Problem:  Nutritional: Goal: Ability to make healthy dietary choices will improve Outcome: Progressing   Problem: Clinical Measurements: Goal: Complications related to the disease process, condition or treatment will be avoided or minimized Outcome: Progressing

## 2023-10-15 NOTE — Progress Notes (Signed)
   [facility] has been requested to perform a transesophageal echocardiogram on Jacqueline [name] for bacteremia.    The patient does NOT have any absolute or relative contraindications to a Transesophageal Echocardiogram (TEE).  The patient has: History of Obstructive Sleep Apnea. She also has chronic anemia felt to be secondary to CKD. Hemoglobin was as low as 6.7 on 10/14/2023. There is mention in chart of 1 unit of PRBC being ordered but it does not look like this was given. Repeat hemoglobin on 3/29 was 8.4. Hemoglobin stable at 7.9 today (which is around her baseline) and platelets 225.  After careful review of history and examination, the risks and benefits of transesophageal echocardiogram have been explained including risks of esophageal damage, perforation (1:10,000 risk), bleeding, pharyngeal hematoma as well as other potential complications associated with conscious sedation including aspiration, arrhythmia, respiratory failure and death. Alternatives to treatment were discussed, questions were answered. Patient is willing to proceed.   The board looks very full tomorrow so procedure will likely not be until Tuesday 10/17/2023. Patient was notified of this and is agreeable. Will place pre-procedural orders.  Signed, Corrin Parker, PA-C  10/15/2023 1:49 PM

## 2023-10-15 NOTE — Progress Notes (Signed)
 [name] NEPHROLOGY PROGRESS NOTE  Assessment/ Plan: Pt is a 43 y.o. yo female  with medical hx of HTN, HLD, DMII, CKD V, HFpEF, CKD s/p LUE AVG as OP (Dr. Ronalee Belts at [facility]), with new ESRD.     # CKD stage V with progression to new ESRD - First HD on 3/26 via her AVG  -Continue dialysis TTS schedule, next HD on Tuesday. - Pt has been accepted at [facility] on TTS 12:00 chair time. Pt can start on Tuesday and will need to arrive at 11:00 am for paperwork prior to treatment.    # Hypertension -Follow with dialysis - continue other current regimen    # Heart failure with preserved EF  -Near euvolemic on exam   # Metabolic acidosis - we have started hemodialysis   # Anemia of CKD - Continue  aranesp 100 mcg every week, monitor lab.  # Secondary hyperparathyroidism, Hypocalcemia: Treated with IV calcium.  Intact PTH 415. Calcitriol 0.5 mcg TID.  # Acute febrile illness due to UTI: Blood culture with streptococcal bacteremia.  Currently on ceftriaxone.  Primary team.   Subjective: Seen and examined.  Acute febrile illness and blood culture positive.  Patient reports feeling tired.  Denies nausea, vomiting, chest pain, shortness of breath.  Objective Vital signs in last 24 hours: Vitals:   10/14/23 2102 10/15/23 0007 10/15/23 0515 10/15/23 0733  BP: (!) 122/59 112/61 131/69 138/77  Pulse:   95 (!) 105  Resp:  17 19 20   Temp:  98.9 F (37.2 C) 98.9 F (37.2 C) 99.2 F (37.3 C)  TempSrc:  Oral Oral Oral  SpO2:  100% 96% 100%  Weight:   78.7 kg   Height:       Weight change: 3.176 kg  Intake/Output Summary (Last 24 hours) at 10/15/2023 1058 Last data filed at 10/15/2023 0840 Gross per 24 hour  Intake 567.4 ml  Output 2000 ml  Net -1432.6 ml       Labs: RENAL PANEL Recent Labs  Lab 10/11/23 0920 10/11/23 1533 10/12/23 0319 10/13/23 0259 10/13/23 1350 10/14/23 0223 10/14/23 0755 10/15/23 0253  NA 140   < > 138 139 138 139 138 138  K 3.7   < >  3.7 4.1 4.0 4.1 4.4 3.9  CL 111   < > 107 107 104 108 106 106  CO2 18*   < > 21* 27 27 22  21* 24  GLUCOSE 82   < > 211* 115* 170* 162* 116* 93  BUN 60*   < > 36* 28* 31* 38* 40* 27*  CREATININE 7.13*   < > 5.11* 3.95* 4.48* 5.08* 5.25* 4.05*  CALCIUM 5.7*   < > 6.4* 6.2* 6.7* 6.1* 6.5* 6.6*  MG 1.9  --   --   --   --   --   --   --   PHOS  --    < > 3.5 3.7  --  3.6 3.8 3.6  ALBUMIN  --    < > 2.5* 2.3*  --  2.2* 2.6* 2.1*   < > = values in this interval not displayed.    Liver Function Tests: Recent Labs  Lab 10/14/23 0223 10/14/23 0755 10/15/23 0253  ALBUMIN 2.2* 2.6* 2.1*   No results for input(s): "LIPASE", "AMYLASE" in the last 168 hours. No results for input(s): "AMMONIA" in the last 168 hours. CBC: Recent Labs    07/21/23 1610 10/11/23 0920 10/11/23 1930 10/12/23 0319 10/13/23 0259 10/14/23 9604  10/14/23 0755 10/15/23 1009  HGB  --    < >  --  7.1* 7.4* 6.7* 8.4* 7.9*  MCV  --    < >  --  91.7 94.7 93.6 94.6 94.6  FERRITIN 323*  --  187  --   --   --   --   --   TIBC 210*  --  176*  --   --   --   --   --   IRON 72  --  41  --   --   --   --   --    < > = values in this interval not displayed.    Cardiac Enzymes: No results for input(s): "CKTOTAL", "CKMB", "CKMBINDEX", "TROPONINI" in the last 168 hours. CBG: Recent Labs  Lab 10/12/23 2139  GLUCAP 199*    Iron Studies:  No results for input(s): "IRON", "TIBC", "TRANSFERRIN", "FERRITIN" in the last 72 hours.  Studies/Results: DG CHEST PORT 1 VIEW Result Date: 10/13/2023 CLINICAL DATA:  Fever. EXAM: PORTABLE CHEST 1 VIEW COMPARISON:  October 11, 2023. FINDINGS: Stable cardiomediastinal silhouette. Both lungs are clear. The visualized skeletal structures are unremarkable. IMPRESSION: No active disease. Electronically Signed   By: Lupita Raider M.D.   On: 10/13/2023 20:03    Medications: Infusions:  cefTRIAXone (ROCEPHIN)  IV Stopped (10/14/23 2138)   promethazine (PHENERGAN) injection (IM or IVPB)       Scheduled Medications:  sodium chloride   Intravenous Once   amLODipine  10 mg Oral Daily   calcitRIOL  0.5 mcg Oral TID   Chlorhexidine Gluconate Cloth  6 each Topical Q0600   darbepoetin (ARANESP) injection - NON-DIALYSIS  100 mcg Subcutaneous Q Fri-1800   gabapentin  300 mg Oral QHS   hydrALAZINE  50 mg Oral TID   metoprolol tartrate  25 mg Oral BID    have reviewed scheduled and prn medications.  Physical Exam: General:NAD, comfortable Heart:RRR, s1s2 nl Lungs:clear b/l, no crackle Abdomen:soft, Non-tender, non-distended Extremities:No edema Dialysis Access: AV graft with good thrill and bruit.  Jamason Peckham Prasad Corbitt Cloke 10/15/2023,10:58 AM  LOS: 4 days

## 2023-10-15 NOTE — Consult Note (Signed)
 [facility]    Date of Admission:  10/11/2023     Reason for Consult: viridan strep bacteremia     Referring Provider: Lynnell Jude     Lines:  Peripheral iv's  Abx: 3/29-c ceftriaxone        Assessment: 43 y.o. female with HTN, HLD, DM 2, CKD stage V started on iHD this admission via lue avg, HFpEF, admitted 3/27 with a week of malaise, generalized weakness, some uri sx, found to have strep mutans high burden bacteremia  She has some sick uri contact but strep mutans high burden bacteremia carries an independent prognosis/need for w/u.   She has dental procedure a couple months ago but has been well till the week prior to admission; probably unrelated. No dental issue at this time  Avg doesn't appear infected  3/26 flu/rsv/covid screen negative 3/26 hiv screen negative 3/28 bcx 2 of 2 sets strep mutans (highest of viridan strep group to cause endocarditis)  I consider this a community acquired bacteremic process. Given her duration of a week or longer, I am concerned she could have IE  3/27 initial tte obtained for dyspnea was not suggestive of valve vegetation but she'll need a tee   Plan: Continue ceftriaxone Repeat blood cx F/u susceptibility Would arrange tee this coming week to r/o IE Maintain standard isolation precaution  Discussed with primary team     ------------------------------------------------ Principal Problem:   Hypocalcemia due to chronic kidney disease    HPI: Jacqueline [name] is a 43 y.o. female HTN, HLD, DM 2, CKD stage V, HFpEF, admitted 3/27 with a week of malaise, generalized weakness, some uri sx, found to have strep mutans high burden bacteremia  Patient reports normally very active. Dental procedures 2 months ago no issue 2 kids with uri sx (tonsilitis and ear infection) Patient with intermittent allergic rhinitis sx and experience those sx but otherwise reports myalgia, malaise, decreased  appetite, n/v, and fever for a week  Presentation: Afebrile but tmax to 103 within 2 days of presentation No leukocytosis Cr baseline Lft labs not done  Chest xray no acute disease Bcx 3/28 done strep mutans Ceftriaxone started 3/29  Fever had defervesced since abx started Tte no obvious veg/irregular valve regurg  No other complaint No focal joint/midline back pain No headache No visual change No rash  No further n/v No diarrhea  Of note, she was started on hd this admission via lue avg  Family History  Problem Relation Age of Onset   Diabetes Mother    Hypertension Mother    Thyroid disease Mother    Kidney disease Maternal Grandmother    Diabetes Maternal Grandmother    Heart attack Other     Social History   Tobacco Use   Smoking status: Never   Smokeless tobacco: Never  Vaping Use   Vaping status: Never Used  Substance Use Topics   Alcohol use: No   Drug use: No    No Known Allergies  Review of Systems: ROS All Other ROS was negative, except mentioned above   Past Medical History:  Diagnosis Date   Anemia    Anxiety    Chronic bronchitis (HCC)    Chronic kidney disease    Hypertension    Increased frequency of headaches    Morbid obesity (HCC)    Necrotizing fasciitis (HCC)    Sleep apnea    does not use CPAP - - gastric bypass  Type II diabetes mellitus (HCC)    no meds, diet controlled       Scheduled Meds:  sodium chloride   Intravenous Once   amLODipine  10 mg Oral Daily   calcitRIOL  0.5 mcg Oral TID   Chlorhexidine Gluconate Cloth  6 each Topical Q0600   darbepoetin (ARANESP) injection - NON-DIALYSIS  100 mcg Subcutaneous Q Fri-1800   gabapentin  300 mg Oral QHS   hydrALAZINE  50 mg Oral TID   metoprolol tartrate  25 mg Oral BID   Continuous Infusions:  cefTRIAXone (ROCEPHIN)  IV Stopped (10/14/23 2138)   promethazine (PHENERGAN) injection (IM or IVPB)     PRN Meds:.acetaminophen **OR** acetaminophen, oxyCODONE,  promethazine (PHENERGAN) injection (IM or IVPB), senna-docusate   OBJECTIVE: Blood pressure (!) 114/55, pulse 99, temperature 98.9 F (37.2 C), temperature source Oral, resp. rate 20, height 5\' 4"  (1.626 m), weight 78.7 kg, last menstrual period 10/09/2023, SpO2 96%.  Physical Exam  General/constitutional: no distress, pleasant HEENT: Normocephalic, PER, Conj Clear, EOMI, Oropharynx clear Neck supple CV: rrr no mrg Lungs: clear to auscultation, normal respiratory effort Abd: Soft, Nontender Ext: no edema; left avg area nontender Skin: No Rash Neuro: nonfocal MSK: no peripheral joint swelling/tenderness/warmth; back spines nontender    Lab Results Lab Results  Component Value Date   WBC 7.7 10/15/2023   HGB 7.9 (L) 10/15/2023   HCT 24.7 (L) 10/15/2023   MCV 94.6 10/15/2023   PLT 225 10/15/2023    Lab Results  Component Value Date   CREATININE 4.05 (H) 10/15/2023   BUN 27 (H) 10/15/2023   NA 138 10/15/2023   K 3.9 10/15/2023   CL 106 10/15/2023   CO2 24 10/15/2023    Lab Results  Component Value Date   ALT 16 08/29/2022   AST 14 (L) 08/29/2022   ALKPHOS 72 08/29/2022   BILITOT 0.6 08/29/2022      Microbiology: Recent Results (from the past 240 hours)  Resp panel by RT-PCR (RSV, Flu A&B, Covid) Anterior Nasal Swab     Status: None   Collection Time: 10/11/23  8:01 AM   Specimen: Anterior Nasal Swab  Result Value Ref Range Status   SARS Coronavirus 2 by RT PCR NEGATIVE NEGATIVE Final    Comment: (NOTE) SARS-CoV-2 target nucleic acids are NOT DETECTED.  The SARS-CoV-2 RNA is generally detectable in upper respiratory specimens during the acute phase of infection. The lowest concentration of SARS-CoV-2 viral copies this assay can detect is 138 copies/mL. A negative result does not preclude SARS-Cov-2 infection and should not be used as the sole basis for treatment or other patient management decisions. A negative result may occur with  improper specimen  collection/handling, submission of specimen other than nasopharyngeal swab, presence of viral mutation(s) within the areas targeted by this assay, and inadequate number of viral copies(<138 copies/mL). A negative result must be combined with clinical observations, patient history, and epidemiological information. The expected result is Negative.  Fact Sheet for Patients:  BloggerCourse.com  Fact Sheet for Healthcare Providers:  SeriousBroker.it  This test is no t yet approved or cleared by the [place] FDA and  has been authorized for detection and/or diagnosis of SARS-CoV-2 by FDA under an Emergency Use Authorization (EUA). This EUA will remain  in effect (meaning this test can be used) for the duration of the COVID-19 declaration under Section 564(b)(1) of the Act, 21 U.S.C.section 360bbb-3(b)(1), unless the authorization is terminated  or revoked sooner.  Influenza A by PCR NEGATIVE NEGATIVE Final   Influenza B by PCR NEGATIVE NEGATIVE Final    Comment: (NOTE) The Xpert Xpress SARS-CoV-2/FLU/RSV plus assay is intended as an aid in the diagnosis of influenza from Nasopharyngeal swab specimens and should not be used as a sole basis for treatment. Nasal washings and aspirates are unacceptable for Xpert Xpress SARS-CoV-2/FLU/RSV testing.  Fact Sheet for Patients: BloggerCourse.com  Fact Sheet for Healthcare Providers: SeriousBroker.it  This test is not yet approved or cleared by the [place] FDA and has been authorized for detection and/or diagnosis of SARS-CoV-2 by FDA under an Emergency Use Authorization (EUA). This EUA will remain in effect (meaning this test can be used) for the duration of the COVID-19 declaration under Section 564(b)(1) of the Act, 21 U.S.C. section 360bbb-3(b)(1), unless the authorization is terminated or revoked.     Resp Syncytial  Virus by PCR NEGATIVE NEGATIVE Final    Comment: (NOTE) Fact Sheet for Patients: BloggerCourse.com  Fact Sheet for Healthcare Providers: SeriousBroker.it  This test is not yet approved or cleared by the [place] FDA and has been authorized for detection and/or diagnosis of SARS-CoV-2 by FDA under an Emergency Use Authorization (EUA). This EUA will remain in effect (meaning this test can be used) for the duration of the COVID-19 declaration under Section 564(b)(1) of the Act, 21 U.S.C. section 360bbb-3(b)(1), unless the authorization is terminated or revoked.  Performed at [facility], [address] [place], [place] [address]   Culture, [place] Urine     Status: Abnormal (Preliminary result)   Collection Time: 10/13/23  5:19 PM   Specimen: Urine, Random  Result Value Ref Range Status   Specimen Description URINE, RANDOM  Final   Special Requests NONE  Final   Culture (A)  Final    <10,000 COLONIES/mL INSIGNIFICANT GROWTH NO GROUP B STREP (S.AGALACTIAE) ISOLATED Performed at [facility], [address] [place], [place] [address]    Report Status PENDING  Incomplete  Culture, blood (Routine X 2) w Reflex to ID Panel     Status: Abnormal (Preliminary result)   Collection Time: 10/13/23  6:53 PM   Specimen: BLOOD  Result Value Ref Range Status   Specimen Description BLOOD SITE NOT SPECIFIED  Final   Special Requests   Final    BOTTLES DRAWN AEROBIC AND ANAEROBIC Blood Culture results may not be optimal due to an inadequate volume of blood received in culture bottles   Culture  Setup Time   Final    GRAM POSITIVE COCCI IN CHAINS IN BOTH AEROBIC AND ANAEROBIC BOTTLES CRITICAL RESULT CALLED TO, READ BACK BY AND VERIFIED WITH: PHARMD CAREN AMEND [id number] BY J RAZZAK,MT    Culture (A)  Final    STREPTOCOCCUS MUTANS SUSCEPTIBILITIES TO FOLLOW Performed at [facility], [address],  [place], [place] [address]    Report Status PENDING  Incomplete  Blood Culture ID Panel (Reflexed)     Status: Abnormal   Collection Time: 10/13/23  6:53 PM  Result Value Ref Range Status   Enterococcus faecalis NOT DETECTED NOT DETECTED Final   Enterococcus Faecium NOT DETECTED NOT DETECTED Final   Listeria monocytogenes NOT DETECTED NOT DETECTED Final   Staphylococcus species NOT DETECTED NOT DETECTED Final   Staphylococcus aureus (BCID) NOT DETECTED NOT DETECTED Final   Staphylococcus epidermidis NOT DETECTED NOT DETECTED Final   Staphylococcus lugdunensis NOT DETECTED NOT DETECTED Final   Streptococcus species DETECTED (A) NOT DETECTED  Final    Comment: Not Enterococcus species, Streptococcus agalactiae, Streptococcus pyogenes, or Streptococcus pneumoniae. CRITICAL RESULT CALLED TO, READ BACK BY AND VERIFIED WITH: PHARMD CAREN AMEND [id number] BY J RAZZAK, MT    Streptococcus agalactiae NOT DETECTED NOT DETECTED Final   Streptococcus pneumoniae NOT DETECTED NOT DETECTED Final   Streptococcus pyogenes NOT DETECTED NOT DETECTED Final   A.calcoaceticus-baumannii NOT DETECTED NOT DETECTED Final   Bacteroides fragilis NOT DETECTED NOT DETECTED Final   Enterobacterales NOT DETECTED NOT DETECTED Final   Enterobacter cloacae complex NOT DETECTED NOT DETECTED Final   Escherichia coli NOT DETECTED NOT DETECTED Final   Klebsiella aerogenes NOT DETECTED NOT DETECTED Final   Klebsiella oxytoca NOT DETECTED NOT DETECTED Final   Klebsiella pneumoniae NOT DETECTED NOT DETECTED Final   Proteus species NOT DETECTED NOT DETECTED Final   Salmonella species NOT DETECTED NOT DETECTED Final   Serratia marcescens NOT DETECTED NOT DETECTED Final   Haemophilus influenzae NOT DETECTED NOT DETECTED Final   Neisseria meningitidis NOT DETECTED NOT DETECTED Final   Pseudomonas aeruginosa NOT DETECTED NOT DETECTED Final   Stenotrophomonas maltophilia NOT DETECTED NOT DETECTED Final   Candida albicans NOT  DETECTED NOT DETECTED Final   Candida auris NOT DETECTED NOT DETECTED Final   Candida glabrata NOT DETECTED NOT DETECTED Final   Candida krusei NOT DETECTED NOT DETECTED Final   Candida parapsilosis NOT DETECTED NOT DETECTED Final   Candida tropicalis NOT DETECTED NOT DETECTED Final   Cryptococcus neoformans/gattii NOT DETECTED NOT DETECTED Final    Comment: Performed at [facility], [address] [place], [place] [address]  Culture, blood (Routine X 2) w Reflex to ID Panel     Status: Abnormal (Preliminary result)   Collection Time: 10/13/23  7:02 PM   Specimen: BLOOD  Result Value Ref Range Status   Specimen Description BLOOD SITE NOT SPECIFIED  Final   Special Requests   Final    BOTTLES DRAWN AEROBIC ONLY Blood Culture results may not be optimal due to an inadequate volume of blood received in culture bottles   Culture  Setup Time   Final    GRAM POSITIVE COCCI IN CHAINS AEROBIC BOTTLE ONLY CRITICAL VALUE NOTED.  VALUE IS CONSISTENT WITH PREVIOUSLY REPORTED AND CALLED VALUE. Performed at [facility], [address] [place], [place] [address]    Culture STREPTOCOCCUS MUTANS (A)  Final   Report Status PENDING  Incomplete     Serology:    Imaging: If present, new imagings (plain films, ct scans, and mri) have been personally visualized and interpreted; radiology reports have been reviewed. Decision making incorporated into the Impression / Recommendations.  3/26 cxr No active disease   3/27 tte  1. Left ventricular ejection fraction, by estimation, is 60 to 65%. The  left ventricle has normal function. The left ventricle has no regional  wall motion abnormalities. There is moderate left ventricular hypertrophy.  Left ventricular diastolic  parameters are consistent with Grade I diastolic dysfunction (impaired  relaxation).   2. Right ventricular systolic function is normal. The right ventricular  size is normal.   3. The mitral valve is normal in  structure. Trivial mitral valve  regurgitation. No evidence of mitral stenosis.   4. The aortic valve is normal in structure. Aortic valve regurgitation is  not visualized. No aortic stenosis is present.   5. The inferior vena cava is normal in size with greater than 50%  respiratory variability, suggesting right atrial pressure of 3  mmHg.    3/28 repeat cxr No active disease    Raymondo Band, MD [facility] [phone] pager    10/15/2023, 12:40 PM

## 2023-10-16 ENCOUNTER — Inpatient Hospital Stay (HOSPITAL_COMMUNITY)

## 2023-10-16 ENCOUNTER — Inpatient Hospital Stay (HOSPITAL_COMMUNITY): Admitting: Anesthesiology

## 2023-10-16 ENCOUNTER — Encounter (HOSPITAL_COMMUNITY)
Admission: EM | Disposition: A | Discharge status: Home / Self Care | Payer: Self-pay | Source: Home / Self Care | Attending: Internal Medicine

## 2023-10-16 ENCOUNTER — Encounter (HOSPITAL_COMMUNITY): Payer: Self-pay | Admitting: Internal Medicine

## 2023-10-16 DIAGNOSIS — I132 Hypertensive heart and chronic kidney disease with heart failure and with stage 5 chronic kidney disease, or end stage renal disease: Secondary | ICD-10-CM

## 2023-10-16 DIAGNOSIS — N185 Chronic kidney disease, stage 5: Secondary | ICD-10-CM

## 2023-10-16 DIAGNOSIS — N186 End stage renal disease: Secondary | ICD-10-CM | POA: Diagnosis not present

## 2023-10-16 DIAGNOSIS — D631 Anemia in chronic kidney disease: Secondary | ICD-10-CM | POA: Diagnosis not present

## 2023-10-16 DIAGNOSIS — I38 Endocarditis, valve unspecified: Secondary | ICD-10-CM | POA: Diagnosis not present

## 2023-10-16 DIAGNOSIS — E1122 Type 2 diabetes mellitus with diabetic chronic kidney disease: Secondary | ICD-10-CM | POA: Diagnosis not present

## 2023-10-16 DIAGNOSIS — E872 Acidosis, unspecified: Secondary | ICD-10-CM | POA: Diagnosis not present

## 2023-10-16 DIAGNOSIS — I5033 Acute on chronic diastolic (congestive) heart failure: Secondary | ICD-10-CM

## 2023-10-16 DIAGNOSIS — Z992 Dependence on renal dialysis: Secondary | ICD-10-CM | POA: Diagnosis not present

## 2023-10-16 DIAGNOSIS — R7881 Bacteremia: Secondary | ICD-10-CM

## 2023-10-16 DIAGNOSIS — N189 Chronic kidney disease, unspecified: Secondary | ICD-10-CM | POA: Diagnosis not present

## 2023-10-16 DIAGNOSIS — I5030 Unspecified diastolic (congestive) heart failure: Secondary | ICD-10-CM | POA: Diagnosis not present

## 2023-10-16 HISTORY — PX: TRANSESOPHAGEAL ECHOCARDIOGRAM (CATH LAB): EP1270

## 2023-10-16 LAB — CULTURE, BLOOD (ROUTINE X 2)

## 2023-10-16 LAB — RENAL FUNCTION PANEL
Albumin: 2.1 g/dL — ABNORMAL LOW (ref 3.5–5.0)
Anion gap: 8 (ref 5–15)
BUN: 42 mg/dL — ABNORMAL HIGH (ref 6–20)
CO2: 25 mmol/L (ref 22–32)
Calcium: 6.5 mg/dL — ABNORMAL LOW (ref 8.9–10.3)
Chloride: 107 mmol/L (ref 98–111)
Creatinine, Ser: 5.13 mg/dL — ABNORMAL HIGH (ref 0.44–1.00)
GFR, Estimated: 10 mL/min — ABNORMAL LOW (ref >60)
Glucose, Bld: 98 mg/dL (ref 70–99)
Phosphorus: 5 mg/dL — ABNORMAL HIGH (ref 2.5–4.6)
Potassium: 4.3 mmol/L (ref 3.5–5.1)
Sodium: 140 mmol/L (ref 135–145)

## 2023-10-16 LAB — CBC WITH DIFFERENTIAL/PLATELET
Abs Immature Granulocytes: 0.06 10*3/uL (ref 0.00–0.07)
Basophils Absolute: 0.1 10*3/uL (ref 0.0–0.1)
Basophils Relative: 1 %
Eosinophils Absolute: 0.2 10*3/uL (ref 0.0–0.5)
Eosinophils Relative: 3 %
HCT: 22.9 % — ABNORMAL LOW (ref 36.0–46.0)
Hemoglobin: 7.3 g/dL — ABNORMAL LOW (ref 12.0–15.0)
Immature Granulocytes: 1 %
Lymphocytes Relative: 28 %
Lymphs Abs: 2.3 10*3/uL (ref 0.7–4.0)
MCH: 30 pg (ref 26.0–34.0)
MCHC: 31.9 g/dL (ref 30.0–36.0)
MCV: 94.2 fL (ref 80.0–100.0)
Monocytes Absolute: 0.8 10*3/uL (ref 0.1–1.0)
Monocytes Relative: 10 %
Neutro Abs: 4.7 10*3/uL (ref 1.7–7.7)
Neutrophils Relative %: 57 %
Platelets: 222 10*3/uL (ref 150–400)
RBC: 2.43 MIL/uL — ABNORMAL LOW (ref 3.87–5.11)
RDW: 13.4 % (ref 11.5–15.5)
WBC: 8.2 10*3/uL (ref 4.0–10.5)
nRBC: 0 % (ref 0.0–0.2)

## 2023-10-16 LAB — ECHO TEE

## 2023-10-16 SURGERY — TRANSESOPHAGEAL ECHOCARDIOGRAM (TEE) (CATHLAB)
Anesthesia: Monitor Anesthesia Care

## 2023-10-16 MED ORDER — PROPOFOL 500 MG/50ML IV EMUL
INTRAVENOUS | Status: DC | PRN
Start: 2023-10-16 — End: 2023-10-16
  Administered 2023-10-16: 100 ug/kg/min via INTRAVENOUS

## 2023-10-16 MED ORDER — LIDOCAINE HCL (PF) 2 % IJ SOLN
INTRAMUSCULAR | Status: DC | PRN
  Administered 2023-10-16: 80 mg via INTRADERMAL

## 2023-10-16 MED ORDER — SODIUM CHLORIDE 0.9% FLUSH
3.0000 mL | Freq: Two times a day (BID) | INTRAVENOUS | Status: DC
  Administered 2023-10-16: 3 mL via INTRAVENOUS

## 2023-10-16 MED ORDER — PHENYLEPHRINE HCL (PRESSORS) 10 MG/ML IV SOLN
INTRAVENOUS | Status: DC | PRN
Start: 2023-10-16 — End: 2023-10-16
  Administered 2023-10-16 (×2): 80 ug via INTRAVENOUS

## 2023-10-16 MED ORDER — SODIUM CHLORIDE 0.9% FLUSH: 3.0000 mL | INTRAVENOUS | Status: DC | PRN

## 2023-10-16 MED ORDER — SODIUM CHLORIDE 0.9 % IV SOLN: INTRAVENOUS | Status: DC | PRN

## 2023-10-16 MED ORDER — PROPOFOL 10 MG/ML IV BOLUS
INTRAVENOUS | Status: DC | PRN
  Administered 2023-10-16: 50 mg via INTRAVENOUS

## 2023-10-16 NOTE — Transfer of Care (Signed)
 Immediate Anesthesia Transfer of Care Note  Patient: Jacqueline [name]  Procedure(s) Performed: TRANSESOPHAGEAL ECHOCARDIOGRAM  Patient Location: Cath Lab  Anesthesia Type:MAC  Level of Consciousness: awake, alert , and patient cooperative  Airway & Oxygen Therapy: Patient Spontanous Breathing and Patient connected to face mask oxygen  Post-op Assessment: Report given to RN, Post -op Vital signs reviewed and stable, Patient moving all extremities, Patient moving all extremities X 4, and Patient able to stick tongue midline  Post vital signs: Reviewed and stable  Last Vitals:  Vitals Value Taken Time  BP 88/70 10/16/23 1330  Temp    Pulse    Resp    SpO2    Vitals shown include unfiled device data.  Last Pain:  Vitals:   10/16/23 1210  TempSrc: Temporal  PainSc:       Patients Stated Pain Goal: 0 (10/15/23 0839)  Complications: No notable events documented.

## 2023-10-16 NOTE — Progress Notes (Signed)
  Echocardiogram Echocardiogram Transesophageal has been performed.  Jacqueline [name] 10/16/2023, 1:37 PM

## 2023-10-16 NOTE — CV Procedure (Signed)
    TRANSESOPHAGEAL ECHOCARDIOGRAM   NAME:  Jacqueline [name]    MRN: [id number] DOB:  Feb 03, 1981    ADMIT DATE: 10/11/2023  INDICATIONS: bacteremia  PROCEDURE:   Informed consent was obtained prior to the procedure. The risks, benefits and alternatives for the procedure were discussed and the patient comprehended these risks.  Risks include, but are not limited to, cough, sore throat, vomiting, nausea, somnolence, esophageal and stomach trauma or perforation, bleeding, low blood pressure, aspiration, pneumonia, infection, trauma to the teeth and death.    Procedural time out performed. The oropharynx was anesthetized with topical 1% benzocaine.    Anesthesia was administered by Dr. Twanna Hy.  The patient was administered 170 mg of propofol and 80 mg of lidocaine to achieve and maintain moderate conscious sedation.  The patient's heart rate, blood pressure, and oxygen saturation are monitored continuously during the procedure. The period of conscious sedation is 12 minutes, of which I was present face-to-face 100% of this time.   The transesophageal probe was inserted in the esophagus and stomach without difficulty and multiple views were obtained.   COMPLICATIONS:    There were no immediate complications.  KEY FINDINGS:  No endocarditis.  LVEF 65-70%.  Full report to follow. Further management per primary team.   Gerri Spore T. Flora Lipps, MD, [facility]  [facility]  [address], [address] [place], [place] [address] [phone]  1:29 PM

## 2023-10-16 NOTE — Anesthesia Postprocedure Evaluation (Signed)
 Anesthesia Post Note  Patient: Jacqueline [name]  Procedure(s) Performed: TRANSESOPHAGEAL ECHOCARDIOGRAM     Patient location during evaluation: Cath Lab Anesthesia Type: MAC Level of consciousness: awake and alert Pain management: pain level controlled Vital Signs Assessment: post-procedure vital signs reviewed and stable Respiratory status: spontaneous breathing Cardiovascular status: stable Anesthetic complications: no  No notable events documented.  Last Vitals:  Vitals:   10/16/23 1404 10/16/23 1607  BP: 117/74 110/65  Pulse: 84 90  Resp: 18 18  Temp: 36.9 C 36.9 C  SpO2:  99%    Last Pain:  Vitals:   10/16/23 1702  TempSrc:   PainSc: Asleep                 Lewie Loron

## 2023-10-16 NOTE — Anesthesia Preprocedure Evaluation (Addendum)
 Anesthesia Evaluation  Patient identified by MRN, date of birth, ID band Patient awake    Reviewed: Allergy & Precautions, NPO status , Patient's Chart, lab work & pertinent test results  Airway Mallampati: III  TM Distance: >3 FB Neck ROM: Full    Dental no notable dental hx. (+) Dental Advisory Given, Teeth Intact   Pulmonary sleep apnea    Pulmonary exam normal breath sounds clear to auscultation       Cardiovascular hypertension, Pt. on medications +CHF  Normal cardiovascular exam Rhythm:Regular Rate:Normal  Echo 09/2023  1. Left ventricular ejection fraction, by estimation, is 60 to 65%. The left ventricle has normal function. The left ventricle has no regional wall motion abnormalities. There is moderate left ventricular hypertrophy. Left ventricular diastolic parameters are consistent with Grade I diastolic dysfunction (impaired relaxation).   2. Right ventricular systolic function is normal. The right ventricular size is normal.   3. The mitral valve is normal in structure. Trivial mitral valve regurgitation. No evidence of mitral stenosis.   4. The aortic valve is normal in structure. Aortic valve regurgitation is not visualized. No aortic stenosis is present.   5. The inferior vena cava is normal in size with greater than 50% respiratory variability, suggesting right atrial pressure of 3 mmHg.    Cath 2017 1. Normal coronary arteries. 2. Left ventricular angiography was not performed. EF was normal by echocardiogram recently. 3. Mildly elevated left ventricular end-diastolic pressure with mild gradient across the LVOT.   Recommendations: The chest pain is likely noncardiac. The patient vomited at the end of the case and was given Zofran.  Discharge home tomorrow if no other issues.    Neuro/Psych  Headaches PSYCHIATRIC DISORDERS Anxiety Depression       GI/Hepatic negative GI ROS, Neg liver ROS,,,  Endo/Other   diabetes, Well Controlled, Type 2    Renal/GU Renal disease  negative genitourinary   Musculoskeletal negative musculoskeletal ROS (+)    Abdominal  (+) + obese  Peds  Hematology  (+) Blood dyscrasia, anemia Lab Results      Component                Value               Date                      NA                       139                 05/02/2023                K                        4.7                 05/02/2023                CO2                      18 (L)              09/01/2022                GLUCOSE                  88  05/02/2023                BUN                      63 (H)              05/02/2023                CREATININE               6.20 (H)            05/02/2023                CALCIUM                  7.0 (L)             09/01/2022                GFRNONAA                 13 (L)              09/01/2022              Anesthesia Other Findings   Reproductive/Obstetrics                             Anesthesia Physical Anesthesia Plan  ASA: 3  Anesthesia Plan: MAC   Post-op Pain Management: Minimal or no pain anticipated   Induction: Intravenous  PONV Risk Score and Plan: 2 and Propofol infusion, Treatment may vary due to age or medical condition and TIVA  Airway Management Planned: Natural Airway  Additional Equipment: None  Intra-op Plan:   Post-operative Plan:   Informed Consent: I have reviewed the patients History and Physical, chart, labs and discussed the procedure including the risks, benefits and alternatives for the proposed anesthesia with the patient or authorized representative who has indicated his/her understanding and acceptance.     Dental advisory given  Plan Discussed with: CRNA  Anesthesia Plan Comments: (PAT note written 04/28/2023 by Shonna Chock, PA-C.  )       Anesthesia Quick Evaluation

## 2023-10-16 NOTE — Interval H&P Note (Signed)
 History and Physical Interval Note:  10/16/2023 12:53 PM  Jacqueline [name]  has presented today for surgery, with the diagnosis of bacteremia.  The various methods of treatment have been discussed with the patient and family. After consideration of risks, benefits and other options for treatment, the patient has consented to  Procedure(s): TRANSESOPHAGEAL ECHOCARDIOGRAM (N/A) as a surgical intervention.  The patient's history has been reviewed, patient examined, no change in status, stable for surgery.  I have reviewed the patient's chart and labs.  Questions were answered to the patient's satisfaction.    NPO for TEE for bacteremia.   Gerri Spore T. Flora Lipps, MD, [facility]  [facility]  [address] [place], [place] [address] [phone]  12:53 PM

## 2023-10-16 NOTE — Plan of Care (Signed)
  Problem: Health Behavior/Discharge Planning: Goal: Ability to manage health-related needs will improve Outcome: Progressing   Problem: Clinical Measurements: Goal: Ability to maintain clinical measurements within normal limits will improve Outcome: Progressing   Problem: Nutrition: Goal: Adequate nutrition will be maintained Outcome: Progressing   Problem: Education: Goal: Knowledge of disease and its progression will improve Outcome: Progressing

## 2023-10-16 NOTE — TOC Progression Note (Signed)
 Transition of Care [facility]) - Progression Note    Patient Details  Name: Jacqueline [name] MRN: [id number] Date of Birth: 06-22-1981  [facility] CM/SW Contact  Leone Haven, RN Phone Number: 10/16/2023, 3:07 PM  Clinical Narrative:    Patient states doctor informed her she will get HD here tomorrow, ID will come to see her to see where infection could be coming from, TEE is negative, possibly could be dialysis catheter ,if so will need to remove it. TOC conts to follow.    Expected Discharge Plan: Home/Self Care Barriers to Discharge: Continued Medical Work up  Expected Discharge Plan and Services In-house Referral: NA Discharge Planning Services: CM Consult Post Acute Care Choice: NA Living arrangements for the past 2 months: Single Family Home                   DME Agency: NA       HH Arranged: NA           Social Determinants of Health (SDOH) Interventions SDOH Screenings   Food Insecurity: No Food Insecurity (10/11/2023)  Housing: Low Risk  (10/11/2023)  Transportation Needs: No Transportation Needs (10/11/2023)  Utilities: Not At Risk (10/11/2023)  Social Connections: Socially Integrated (10/11/2023)  Tobacco Use: Low Risk  (10/16/2023)    Readmission Risk Interventions    10/12/2023   11:30 AM  Readmission Risk Prevention Plan  Transportation Screening Complete  PCP or Specialist Appt within 5-7 Days Complete  Home Care Screening Complete  Medication Review (RN CM) Complete

## 2023-10-16 NOTE — Progress Notes (Signed)
 [facility] NEPHROLOGY PROGRESS NOTE  Assessment/ Plan: Pt is a 43 y.o. yo female  with medical hx of HTN, HLD, DMII, CKD V, HFpEF, CKD s/p LUE AVG as OP (Dr. Ronalee Belts at [facility]), with new ESRD.     # CKD stage V with progression to new ESRD - First HD on 3/26 via her AVG  -Continue dialysis TTS schedule, next HD on Tuesday. - Pt has been accepted at [facility] on TTS 12:00 chair time. Pt can start on Tuesday and will need to arrive at 11:00 am for paperwork prior to treatment.    # Hypertension: BP too low now 90s this AM: stopped hydralazine   # Heart failure with preserved EF  -Near euvolemic on exam   # Metabolic acidosis: resolved with HD   # Anemia of CKD - Continue  aranesp 100 mcg every week, monitor lab.  # Secondary hyperparathyroidism, Hypocalcemia: Treated with IV calcium.  Intact PTH 415. Calcitriol 0.5 mcg TID.  # Acute febrile illness due to UTI and bacteremia:  Blood culture with streptococcal mutans.  Currently on ceftriaxone.  Primary team has consulted ID - repeat cx pending.  TEE pending.   OK for d/c from nephrology perspective.   Subjective: Seen and examined.  Feeling better.  Denies nausea, vomiting, chest pain, shortness of breath.  Objective Vital signs in last 24 hours: Vitals:   10/15/23 2013 10/15/23 2351 10/16/23 0445 10/16/23 0748  BP: 107/61 103/64 (!) 98/59 113/69  Pulse: 86 84 82   Resp: 18 17 18 18   Temp: 98.4 F (36.9 C) 98.3 F (36.8 C) 98.6 F (37 C) 98.6 F (37 C)  TempSrc: Oral Oral Oral Oral  SpO2: 97% 97% 95%   Weight:   80.1 kg   Height:       Weight change: -3 kg  Intake/Output Summary (Last 24 hours) at 10/16/2023 0849 Last data filed at 10/16/2023 0447 Gross per 24 hour  Intake 627 ml  Output 100 ml  Net 527 ml       Labs: RENAL PANEL Recent Labs  Lab 10/11/23 0920 10/11/23 1533 10/13/23 0259 10/13/23 1350 10/14/23 0223 10/14/23 0755 10/15/23 0253 10/16/23 0325  NA 140   < > 139 138 139 138 138  140  K 3.7   < > 4.1 4.0 4.1 4.4 3.9 4.3  CL 111   < > 107 104 108 106 106 107  CO2 18*   < > 27 27 22  21* 24 25  GLUCOSE 82   < > 115* 170* 162* 116* 93 98  BUN 60*   < > 28* 31* 38* 40* 27* 42*  CREATININE 7.13*   < > 3.95* 4.48* 5.08* 5.25* 4.05* 5.13*  CALCIUM 5.7*   < > 6.2* 6.7* 6.1* 6.5* 6.6* 6.5*  MG 1.9  --   --   --   --   --   --   --   PHOS  --    < > 3.7  --  3.6 3.8 3.6 5.0*  ALBUMIN  --    < > 2.3*  --  2.2* 2.6* 2.1* 2.1*   < > = values in this interval not displayed.    Liver Function Tests: Recent Labs  Lab 10/14/23 0755 10/15/23 0253 10/16/23 0325  ALBUMIN 2.6* 2.1* 2.1*   No results for input(s): "LIPASE", "AMYLASE" in the last 168 hours. No results for input(s): "AMMONIA" in the last 168 hours. CBC: Recent Labs    07/21/23  1610 10/11/23 0920 10/11/23 1930 10/12/23 0319 10/13/23 0259 10/14/23 0223 10/14/23 0755 10/15/23 1009 10/16/23 0325  HGB  --    < >  --    < > 7.4* 6.7* 8.4* 7.9* 7.3*  MCV  --    < >  --    < > 94.7 93.6 94.6 94.6 94.2  FERRITIN 323*  --  187  --   --   --   --   --   --   TIBC 210*  --  176*  --   --   --   --   --   --   IRON 72  --  41  --   --   --   --   --   --    < > = values in this interval not displayed.    Cardiac Enzymes: No results for input(s): "CKTOTAL", "CKMB", "CKMBINDEX", "TROPONINI" in the last 168 hours. CBG: Recent Labs  Lab 10/12/23 2139  GLUCAP 199*    Iron Studies:  No results for input(s): "IRON", "TIBC", "TRANSFERRIN", "FERRITIN" in the last 72 hours.  Studies/Results: No results found.   Medications: Infusions:  cefTRIAXone (ROCEPHIN)  IV 200 mL/hr at 10/16/23 0559   promethazine (PHENERGAN) injection (IM or IVPB) 150 mL/hr at 10/16/23 0340    Scheduled Medications:  sodium chloride   Intravenous Once   amLODipine  10 mg Oral Daily   calcitRIOL  0.5 mcg Oral TID   Chlorhexidine Gluconate Cloth  6 each Topical Q0600   darbepoetin (ARANESP) injection - NON-DIALYSIS  100 mcg  Subcutaneous Q Fri-1800   gabapentin  300 mg Oral QHS   hydrALAZINE  50 mg Oral TID   metoprolol tartrate  25 mg Oral BID    have reviewed scheduled and prn medications.  Physical Exam: General:NAD, comfortable Heart:RRR, s1s2 nl Lungs:clear b/l, no crackle Abdomen:soft, Non-tender, non-distended Extremities:No edema Dialysis Access: AV graft with good thrill and bruit.   Lillia Abed A Suesan Mohrmann 10/16/2023,8:49 AM  LOS: 5 days

## 2023-10-16 NOTE — Progress Notes (Signed)
 PROGRESS NOTE  Jacqueline [name] OZH:[id number] DOB: [birth year] DOA: 10/11/2023 PCP: [place], MD  HPI/Recap of past 24 hours: Patient is a 43 year old female, with past medical history significant for an hypertension, hyperlipidemia, type 2 diabetes mellitus, CKD stage V, and congestive heart failure.  Patient was admitted with multiple complaints, including generalized weakness, fevers with Tmax of around 102, URI symptoms, nasal congestion, some nausea/vomiting with some loose stools.  History of sick contacts (2 children).  Worsening of symptoms and shortness of breath/dyspnea with some mild chest discomfort was reported a week prior to presentation.  Patient also had muscle cramps and tingling in both hands and feet and was found to have severe hypocalcemia.  Other labs noted creatinine 7.13, BNP 103.5, troponin WNL x 2, hemoglobin 7.4, baseline around 8.  Influenza, RSV and COVID all negative.  Chest x-ray unremarkable.  Due to severe hypocalcemia, patient was transferred to [facility] for nephrology evaluation and further workup.  AV graft was placed on 05/02/2023, and acceptable for use.  Patient was not on hemodialysis (prior to presentation), but patient is known on hemodialysis.  Patient has outpatient chair (TTS) arranged.   Patient continued to have fever during the hospital stay.  Blood culture grew Streptococcus.  Infectious disease advised TEE.  TEE was done earlier today (10/16/2023), TEE was nonrevealing.  Patient has been on IV Rocephin.  No documented fever in the last 24 hours.  As documented above, patient had left upper extremity AVG placed around October 2024.  Infectious disease plans to review patient and advise determine how to proceed.  Nephrology team updated.    Assessment/Plan: Principal Problem:   Hypocalcemia due to chronic kidney disease   Hypocalcemia:  Calcium noted to be 5.7 on admission Nephrology consulted, received calcium gluconate x  2 Corrected calcium level is 7.7 today.     Fever: -Blood culture grew Streptococcus. -Patient continued to have fever of unknown source for several days.  -Infectious disease team recommended TEE. -TEE is nonrevealing. -Patient had left upper extremity AV graft placed in October 2024.  Infectious disease will look at it as well. -Patient has been on IV Rocephin. -Fever has resolved with IV Rocephin. -Nephrology team updated. -Input from the cardiology team is appreciated (TEE).   -COVID/RSV/influenza negative -Continue IV Rocephin.  Possible UTI: -Urine culture grew less than 10,000 colonies per mL..   CKD stage V with progression to ESRD Metabolic acidosis Nephrology on board, initiated HD on 10/11/2023 via AVG Patient still makes urine Outpatient hemodialysis chair is secured (TTS)    Anemia of CKD Hemoglobin of 7.3 g/dL noted today.   Anemia panel with iron 41, TIBC 176, sats 23, ferritin 187 Continue ESA   Heart failure with preserved EF Management with dialysis Echo showed EF of 60 to 65%, no regional wall motion abnormality, moderate left ventricular hypertrophy, grade 1 diastolic dysfunction   Hypertension Blood pressure is normal/low normal. -Will decrease amlodipine from 10 Mg p.o. once daily to 5 Mg p.o. once daily.  T -Continue metoprolol 25 Mg p.o. once daily. -Minimize opiates) will change oxycodone 5 Mg p.o. every 4 hours as needed to 5 Mg every 8 hours as needed).   Diabetes mellitus type 2 Repeat A1c 4.4 Not on any meds, diet controlled   History of morbid obesity status post gastric bypass BMI 30, obesity Continue lifestyle modification      Estimated body mass index is 30.31 kg/m as calculated from the following:   Height as of  this encounter: 5\' 4"  (1.626 m).   Weight as of this encounter: 80.1 kg.     Code Status: Full  Family Communication: Cousin at bedside  Disposition Plan: Status is: Inpatient Remains inpatient appropriate because:  Level of care      Consultants: Nephrology Infectious disease. Cardiology for TEE  Procedures: TEE done today 10/16/2023 (normal)  Antimicrobials: IV Rocephin 2 g daily.  DVT prophylaxis: Heparin   Objective: Vitals:   10/16/23 1330 10/16/23 1341 10/16/23 1404 10/16/23 1607  BP: (!) 88/70 108/63 117/74 110/65  Pulse:  80 84 90  Resp:  20 18 18   Temp:   98.4 F (36.9 C) 98.5 F (36.9 C)  TempSrc:   Oral Oral  SpO2:  100%  99%  Weight:      Height:        Intake/Output Summary (Last 24 hours) at 10/16/2023 1645 Last data filed at 10/16/2023 1320 Gross per 24 hour  Intake 553 ml  Output 100 ml  Net 453 ml   Filed Weights   10/14/23 0941 10/15/23 0515 10/16/23 0445  Weight: 83.1 kg 78.7 kg 80.1 kg    Exam: General: Awake and alert.  Actively any distress. Cardiovascular: S1, S2  Respiratory: Clear to auscultation. Abdomen: Soft, nontender, nondistended, bowel sounds present Extremities: No leg edema. Neuro: Awake and alert.  Moves all extremities.    Data Reviewed: CBC: Recent Labs  Lab 10/12/23 0319 10/13/23 0259 10/14/23 0223 10/14/23 0755 10/15/23 1009 10/16/23 0325  WBC 6.0 6.7 8.7 8.4 7.7 8.2  NEUTROABS 4.2 4.5 5.6  --  5.5 4.7  HGB 7.1* 7.4* 6.7* 8.4* 7.9* 7.3*  HCT 22.1* 23.2* 20.4* 26.2* 24.7* 22.9*  MCV 91.7 94.7 93.6 94.6 94.6 94.2  PLT 210 208 214 265 225 222   Basic Metabolic Panel: Recent Labs  Lab 10/11/23 0920 10/11/23 1533 10/13/23 0259 10/13/23 1350 10/14/23 0223 10/14/23 0755 10/15/23 0253 10/16/23 0325  NA 140   < > 139 138 139 138 138 140  K 3.7   < > 4.1 4.0 4.1 4.4 3.9 4.3  CL 111   < > 107 104 108 106 106 107  CO2 18*   < > 27 27 22  21* 24 25  GLUCOSE 82   < > 115* 170* 162* 116* 93 98  BUN 60*   < > 28* 31* 38* 40* 27* 42*  CREATININE 7.13*   < > 3.95* 4.48* 5.08* 5.25* 4.05* 5.13*  CALCIUM 5.7*   < > 6.2* 6.7* 6.1* 6.5* 6.6* 6.5*  MG 1.9  --   --   --   --   --   --   --   PHOS  --    < > 3.7  --  3.6 3.8  3.6 5.0*   < > = values in this interval not displayed.   GFR: Estimated Creatinine Clearance: 14.5 mL/min (A) (by C-G formula based on SCr of 5.13 mg/dL (H)). Liver Function Tests: Recent Labs  Lab 10/13/23 0259 10/14/23 0223 10/14/23 0755 10/15/23 0253 10/16/23 0325  ALBUMIN 2.3* 2.2* 2.6* 2.1* 2.1*   No results for input(s): "LIPASE", "AMYLASE" in the last 168 hours. No results for input(s): "AMMONIA" in the last 168 hours. Coagulation Profile: No results for input(s): "INR", "PROTIME" in the last 168 hours. Cardiac Enzymes: No results for input(s): "CKTOTAL", "CKMB", "CKMBINDEX", "TROPONINI" in the last 168 hours. BNP (last 3 results) No results for input(s): "PROBNP" in the last 8760 hours. HbA1C: No results for input(s): "  HGBA1C" in the last 72 hours.  CBG: Recent Labs  Lab 10/12/23 2139  GLUCAP 199*   Lipid Profile: No results for input(s): "CHOL", "HDL", "LDLCALC", "TRIG", "CHOLHDL", "LDLDIRECT" in the last 72 hours. Thyroid Function Tests: No results for input(s): "TSH", "T4TOTAL", "FREET4", "T3FREE", "THYROIDAB" in the last 72 hours. Anemia Panel: No results for input(s): "VITAMINB12", "FOLATE", "FERRITIN", "TIBC", "IRON", "RETICCTPCT" in the last 72 hours.  Urine analysis:    Component Value Date/Time   COLORURINE YELLOW 10/13/2023 1728   APPEARANCEUR HAZY (A) 10/13/2023 1728   LABSPEC 1.014 10/13/2023 1728   PHURINE 7.0 10/13/2023 1728   GLUCOSEU 150 (A) 10/13/2023 1728   HGBUR SMALL (A) 10/13/2023 1728   BILIRUBINUR NEGATIVE 10/13/2023 1728   KETONESUR NEGATIVE 10/13/2023 1728   PROTEINUR >=300 (A) 10/13/2023 1728   UROBILINOGEN 0.2 08/21/2012 2130   NITRITE NEGATIVE 10/13/2023 1728   LEUKOCYTESUR MODERATE (A) 10/13/2023 1728   Sepsis Labs: @LABRCNTIP (procalcitonin:4,lacticidven:4)  ) Recent Results (from the past 240 hours)  Resp panel by RT-PCR (RSV, Flu A&B, Covid) Anterior Nasal Swab     Status: None   Collection Time: 10/11/23  8:01 AM    Specimen: Anterior Nasal Swab  Result Value Ref Range Status   SARS Coronavirus 2 by RT PCR NEGATIVE NEGATIVE Final    Comment: (NOTE) SARS-CoV-2 target nucleic acids are NOT DETECTED.  The SARS-CoV-2 RNA is generally detectable in upper respiratory specimens during the acute phase of infection. The lowest concentration of SARS-CoV-2 viral copies this assay can detect is 138 copies/mL. A negative result does not preclude SARS-Cov-2 infection and should not be used as the sole basis for treatment or other patient management decisions. A negative result may occur with  improper specimen collection/handling, submission of specimen other than nasopharyngeal swab, presence of viral mutation(s) within the areas targeted by this assay, and inadequate number of viral copies(<138 copies/mL). A negative result must be combined with clinical observations, patient history, and epidemiological information. The expected result is Negative.  Fact Sheet for Patients:  BloggerCourse.com  Fact Sheet for Healthcare Providers:  SeriousBroker.it  This test is no t yet approved or cleared by the [place] FDA and  has been authorized for detection and/or diagnosis of SARS-CoV-2 by FDA under an Emergency Use Authorization (EUA). This EUA will remain  in effect (meaning this test can be used) for the duration of the COVID-19 declaration under Section 564(b)(1) of the Act, 21 U.S.C.section 360bbb-3(b)(1), unless the authorization is terminated  or revoked sooner.       Influenza A by PCR NEGATIVE NEGATIVE Final   Influenza B by PCR NEGATIVE NEGATIVE Final    Comment: (NOTE) The Xpert Xpress SARS-CoV-2/FLU/RSV plus assay is intended as an aid in the diagnosis of influenza from Nasopharyngeal swab specimens and should not be used as a sole basis for treatment. Nasal washings and aspirates are unacceptable for Xpert Xpress  SARS-CoV-2/FLU/RSV testing.  Fact Sheet for Patients: BloggerCourse.com  Fact Sheet for Healthcare Providers: SeriousBroker.it  This test is not yet approved or cleared by the [place] FDA and has been authorized for detection and/or diagnosis of SARS-CoV-2 by FDA under an Emergency Use Authorization (EUA). This EUA will remain in effect (meaning this test can be used) for the duration of the COVID-19 declaration under Section 564(b)(1) of the Act, 21 U.S.C. section 360bbb-3(b)(1), unless the authorization is terminated or revoked.     Resp Syncytial Virus by PCR NEGATIVE NEGATIVE Final    Comment: (NOTE) Fact Sheet for Patients:  BloggerCourse.com  Fact Sheet for Healthcare Providers: SeriousBroker.it  This test is not yet approved or cleared by the [place] FDA and has been authorized for detection and/or diagnosis of SARS-CoV-2 by FDA under an Emergency Use Authorization (EUA). This EUA will remain in effect (meaning this test can be used) for the duration of the COVID-19 declaration under Section 564(b)(1) of the Act, 21 U.S.C. section 360bbb-3(b)(1), unless the authorization is terminated or revoked.  Performed at [facility], [address] [place], [place] [address]   Culture, [place] Urine     Status: Abnormal   Collection Time: 10/13/23  5:19 PM   Specimen: Urine, Random  Result Value Ref Range Status   Specimen Description URINE, RANDOM  Final   Special Requests NONE  Final   Culture (A)  Final    <10,000 COLONIES/mL INSIGNIFICANT GROWTH NO GROUP B STREP (S.AGALACTIAE) ISOLATED Performed at [facility], [address] [place], [place] [address]    Report Status 10/15/2023 FINAL  Final  Culture, blood (Routine X 2) w Reflex to ID Panel     Status: Abnormal   Collection Time: 10/13/23  6:53 PM   Specimen: BLOOD  Result Value Ref Range  Status   Specimen Description BLOOD SITE NOT SPECIFIED  Final   Special Requests   Final    BOTTLES DRAWN AEROBIC AND ANAEROBIC Blood Culture results may not be optimal due to an inadequate volume of blood received in culture bottles   Culture  Setup Time   Final    GRAM POSITIVE COCCI IN CHAINS IN BOTH AEROBIC AND ANAEROBIC BOTTLES CRITICAL RESULT CALLED TO, READ BACK BY AND VERIFIED WITH: PHARMD CAREN AMEND [phone] [id number] BY J RAZZAK,MT Performed at [facility], [address] [place], [place] [address]    Culture STREPTOCOCCUS MUTANS (A)  Final   Report Status 10/16/2023 FINAL  Final   Organism ID, Bacteria STREPTOCOCCUS MUTANS  Final      Susceptibility   Streptococcus mutans - MIC*    PENICILLIN <=0.06 SENSITIVE Sensitive     CEFTRIAXONE <=0.12 SENSITIVE Sensitive     ERYTHROMYCIN <=0.12 SENSITIVE Sensitive     LEVOFLOXACIN 2 SENSITIVE Sensitive     VANCOMYCIN 1 SENSITIVE Sensitive     * STREPTOCOCCUS MUTANS  Blood Culture ID Panel (Reflexed)     Status: Abnormal   Collection Time: 10/13/23  6:53 PM  Result Value Ref Range Status   Enterococcus faecalis NOT DETECTED NOT DETECTED Final   Enterococcus Faecium NOT DETECTED NOT DETECTED Final   Listeria monocytogenes NOT DETECTED NOT DETECTED Final   Staphylococcus species NOT DETECTED NOT DETECTED Final   Staphylococcus aureus (BCID) NOT DETECTED NOT DETECTED Final   Staphylococcus epidermidis NOT DETECTED NOT DETECTED Final   Staphylococcus lugdunensis NOT DETECTED NOT DETECTED Final   Streptococcus species DETECTED (A) NOT DETECTED Final    Comment: Not Enterococcus species, Streptococcus agalactiae, Streptococcus pyogenes, or Streptococcus pneumoniae. CRITICAL RESULT CALLED TO, READ BACK BY AND VERIFIED WITH: PHARMD CAREN AMEND [id number] BY J RAZZAK, MT    Streptococcus agalactiae NOT DETECTED NOT DETECTED Final   Streptococcus pneumoniae NOT DETECTED NOT DETECTED Final   Streptococcus pyogenes NOT DETECTED NOT  DETECTED Final   A.calcoaceticus-baumannii NOT DETECTED NOT DETECTED Final   Bacteroides fragilis NOT DETECTED NOT DETECTED Final   Enterobacterales NOT DETECTED NOT DETECTED Final   Enterobacter cloacae complex NOT DETECTED NOT DETECTED Final   Escherichia coli NOT DETECTED NOT DETECTED Final  Klebsiella aerogenes NOT DETECTED NOT DETECTED Final   Klebsiella oxytoca NOT DETECTED NOT DETECTED Final   Klebsiella pneumoniae NOT DETECTED NOT DETECTED Final   Proteus species NOT DETECTED NOT DETECTED Final   Salmonella species NOT DETECTED NOT DETECTED Final   Serratia marcescens NOT DETECTED NOT DETECTED Final   Haemophilus influenzae NOT DETECTED NOT DETECTED Final   Neisseria meningitidis NOT DETECTED NOT DETECTED Final   Pseudomonas aeruginosa NOT DETECTED NOT DETECTED Final   Stenotrophomonas maltophilia NOT DETECTED NOT DETECTED Final   Candida albicans NOT DETECTED NOT DETECTED Final   Candida auris NOT DETECTED NOT DETECTED Final   Candida glabrata NOT DETECTED NOT DETECTED Final   Candida krusei NOT DETECTED NOT DETECTED Final   Candida parapsilosis NOT DETECTED NOT DETECTED Final   Candida tropicalis NOT DETECTED NOT DETECTED Final   Cryptococcus neoformans/gattii NOT DETECTED NOT DETECTED Final    Comment: Performed at [facility], [address] [place], [place] [address]  Culture, blood (Routine X 2) w Reflex to ID Panel     Status: Abnormal   Collection Time: 10/13/23  7:02 PM   Specimen: BLOOD  Result Value Ref Range Status   Specimen Description BLOOD SITE NOT SPECIFIED  Final   Special Requests   Final    BOTTLES DRAWN AEROBIC ONLY Blood Culture results may not be optimal due to an inadequate volume of blood received in culture bottles   Culture  Setup Time   Final    GRAM POSITIVE COCCI IN CHAINS AEROBIC BOTTLE ONLY CRITICAL VALUE NOTED.  VALUE IS CONSISTENT WITH PREVIOUSLY REPORTED AND CALLED VALUE.    Culture (A)  Final    STREPTOCOCCUS  MUTANS SUSCEPTIBILITIES PERFORMED ON PREVIOUS CULTURE WITHIN THE LAST 5 DAYS. Performed at [facility], [address], [place], [place] [address]    Report Status 10/16/2023 FINAL  Final  Culture, blood (Routine X 2) w Reflex to ID Panel     Status: None (Preliminary result)   Collection Time: 10/15/23  2:21 PM   Specimen: BLOOD RIGHT ARM  Result Value Ref Range Status   Specimen Description BLOOD RIGHT ARM  Final   Special Requests   Final    BOTTLES DRAWN AEROBIC AND ANAEROBIC Blood Culture results may not be optimal due to an inadequate volume of blood received in culture bottles   Culture   Final    NO GROWTH < 24 HOURS Performed at [facility], [address] [place], [place] [address]    Report Status PENDING  Incomplete  Culture, blood (Routine X 2) w Reflex to ID Panel     Status: None (Preliminary result)   Collection Time: 10/15/23  2:21 PM   Specimen: BLOOD RIGHT HAND  Result Value Ref Range Status   Specimen Description BLOOD RIGHT HAND  Final   Special Requests   Final    BOTTLES DRAWN AEROBIC AND ANAEROBIC Blood Culture adequate volume   Culture   Final    NO GROWTH < 24 HOURS Performed at [facility], [address], [place], [place] [address]    Report Status PENDING  Incomplete      Studies: ECHO TEE Result Date: 10/16/2023    TRANSESOPHOGEAL ECHO REPORT   Patient Name:   LATAUSHA FLAMM Date of Exam: 10/16/2023 Medical Rec #:  [id number]    Height:       64.0 in Accession #:    [id number]   Weight:  176.6 lb Date of Birth:  November 05, 1980    BSA:          1.856 m Patient Age:    42 years     BP:           109/61 mmHg Patient Gender: F            HR:           80 bpm. Exam Location:  Inpatient Procedure: 3D Echo, Transesophageal Echo, Saline Contrast Bubble Study, Cardiac            Doppler and Color Doppler (Both Spectral and Color Flow Doppler were            utilized during procedure). Indications:     Endocarditis  History:         Patient has  prior history of Echocardiogram examinations, most                  recent 10/12/2023. CHF; Risk Factors:Hypertension and Diabetes.  Sonographer:     Rosaland Lao Sonographer#2:   Irving Burton Senior Referring Phys:  [id number] Corrin Parker Diagnosing Phys: Lennie Odor MD PROCEDURE: After discussion of the risks and benefits of a TEE, an informed consent was obtained from the patient. TEE procedure time was 12 minutes. The transesophogeal probe was passed without difficulty through the esophogus of the patient. Imaged were obtained with the patient in a left lateral decubitus position. Sedation performed by different physician. The patient was monitored while under deep sedation. Anesthestetic sedation was provided intravenously by Anesthesiology: 170mg  of Propofol, 80mg  of Lidocaine. Image quality was excellent. The patient's vital signs; including heart rate, blood pressure, and oxygen saturation; remained stable throughout the procedure. The patient developed no complications during the procedure.  IMPRESSIONS  1. Left ventricular ejection fraction, by estimation, is 65 to 70%. Left ventricular ejection fraction by 3D volume is 67 %. The left ventricle has normal function. The left ventricle has no regional wall motion abnormalities.  2. Right ventricular systolic function is normal. The right ventricular size is normal.  3. No left atrial/left atrial appendage thrombus was detected.  4. The mitral valve is grossly normal. Trivial mitral valve regurgitation. No evidence of mitral stenosis.  5. The aortic valve is tricuspid. Aortic valve regurgitation is not visualized. No aortic stenosis is present.  6. Agitated saline contrast bubble study was negative, with no evidence of any interatrial shunt.  7. 3D performed for the EF and demonstrates LVEF by 3D 67%. Conclusion(s)/Recommendation(s): No evidence of vegetation/infective endocarditis on this transesophageael echocardiogram. FINDINGS  Left Ventricle: Left  ventricular ejection fraction, by estimation, is 65 to 70%. Left ventricular ejection fraction by 3D volume is 67 %. The left ventricle has normal function. The left ventricle has no regional wall motion abnormalities. The left ventricular internal cavity size was normal in size. Right Ventricle: The right ventricular size is normal. No increase in right ventricular wall thickness. Right ventricular systolic function is normal. Left Atrium: Left atrial size was normal in size. No left atrial/left atrial appendage thrombus was detected. Right Atrium: Right atrial size was normal in size. Pericardium: Trivial pericardial effusion is present. Mitral Valve: The mitral valve is grossly normal. Trivial mitral valve regurgitation. No evidence of mitral valve stenosis. There is no evidence of mitral valve vegetation. Tricuspid Valve: The tricuspid valve is grossly normal. Tricuspid valve regurgitation is trivial. No evidence of tricuspid stenosis. There is no evidence of tricuspid valve vegetation. Aortic Valve: The aortic valve is  tricuspid. Aortic valve regurgitation is not visualized. No aortic stenosis is present. There is no evidence of aortic valve vegetation. Pulmonic Valve: The pulmonic valve was grossly normal. Pulmonic valve regurgitation is not visualized. No evidence of pulmonic stenosis. There is no evidence of pulmonic valve vegetation. Aorta: The aortic root and ascending aorta are structurally normal, with no evidence of dilitation. Venous: The left lower pulmonary vein, left upper pulmonary vein, right lower pulmonary vein and right upper pulmonary vein are normal. IAS/Shunts: The atrial septum is grossly normal. Agitated saline contrast was given intravenously to evaluate for intracardiac shunting. Agitated saline contrast bubble study was negative, with no evidence of any interatrial shunt. Additional Comments: Spectral Doppler performed.  3D Volume EF LV 3D EF:    Left ventricular ejection fraction by 3D  volume is 67              %.  3D Volume EF LV 3D EF:    67.20 % LV 3D EDV:   104800.00 mm LV 3D ESV:   34300.00 mm LV 3D SV:    70500.00 mm  3D Volume EF: 3D EF:        67 %  AORTA Ao Root diam: 2.98 cm Ao Asc diam:  3.00 cm Lennie Odor MD Electronically signed by Lennie Odor MD Signature Date/Time: 10/16/2023/2:05:15 PM    Final    EP STUDY Result Date: 10/16/2023 See surgical note for result.     Scheduled Meds:  sodium chloride   Intravenous Once   amLODipine  10 mg Oral Daily   calcitRIOL  0.5 mcg Oral TID   Chlorhexidine Gluconate Cloth  6 each Topical Q0600   darbepoetin (ARANESP) injection - NON-DIALYSIS  100 mcg Subcutaneous Q Fri-1800   gabapentin  300 mg Oral QHS   metoprolol tartrate  25 mg Oral BID    Continuous Infusions:  cefTRIAXone (ROCEPHIN)  IV 200 mL/hr at 10/16/23 0559   promethazine (PHENERGAN) injection (IM or IVPB) 150 mL/hr at 10/16/23 0340     LOS: 5 days   Time spent: 55 minutes.  Barnetta Chapel, MD [facility]  If 7PM-7AM, please contact night-coverage [email or web address] 10/16/2023, 4:45 PM

## 2023-10-17 ENCOUNTER — Inpatient Hospital Stay (HOSPITAL_COMMUNITY)

## 2023-10-17 ENCOUNTER — Encounter (HOSPITAL_COMMUNITY): Payer: Self-pay | Admitting: Cardiovascular Disease

## 2023-10-17 DIAGNOSIS — D631 Anemia in chronic kidney disease: Secondary | ICD-10-CM | POA: Diagnosis not present

## 2023-10-17 DIAGNOSIS — M546 Pain in thoracic spine: Secondary | ICD-10-CM | POA: Diagnosis not present

## 2023-10-17 DIAGNOSIS — B955 Unspecified streptococcus as the cause of diseases classified elsewhere: Secondary | ICD-10-CM | POA: Diagnosis not present

## 2023-10-17 DIAGNOSIS — I132 Hypertensive heart and chronic kidney disease with heart failure and with stage 5 chronic kidney disease, or end stage renal disease: Secondary | ICD-10-CM | POA: Diagnosis not present

## 2023-10-17 DIAGNOSIS — N189 Chronic kidney disease, unspecified: Secondary | ICD-10-CM | POA: Diagnosis not present

## 2023-10-17 DIAGNOSIS — I5030 Unspecified diastolic (congestive) heart failure: Secondary | ICD-10-CM | POA: Diagnosis not present

## 2023-10-17 DIAGNOSIS — Z992 Dependence on renal dialysis: Secondary | ICD-10-CM | POA: Diagnosis not present

## 2023-10-17 DIAGNOSIS — E1122 Type 2 diabetes mellitus with diabetic chronic kidney disease: Secondary | ICD-10-CM | POA: Diagnosis not present

## 2023-10-17 DIAGNOSIS — R7881 Bacteremia: Secondary | ICD-10-CM | POA: Diagnosis not present

## 2023-10-17 DIAGNOSIS — E872 Acidosis, unspecified: Secondary | ICD-10-CM | POA: Diagnosis not present

## 2023-10-17 DIAGNOSIS — N186 End stage renal disease: Secondary | ICD-10-CM | POA: Diagnosis not present

## 2023-10-17 DIAGNOSIS — M542 Cervicalgia: Secondary | ICD-10-CM | POA: Diagnosis not present

## 2023-10-17 LAB — RENAL FUNCTION PANEL
Albumin: 2.4 g/dL — ABNORMAL LOW (ref 3.5–5.0)
Anion gap: 12 (ref 5–15)
BUN: 49 mg/dL — ABNORMAL HIGH (ref 6–20)
CO2: 22 mmol/L (ref 22–32)
Calcium: 6.6 mg/dL — ABNORMAL LOW (ref 8.9–10.3)
Chloride: 107 mmol/L (ref 98–111)
Creatinine, Ser: 5.97 mg/dL — ABNORMAL HIGH (ref 0.44–1.00)
GFR, Estimated: 8 mL/min — ABNORMAL LOW (ref >60)
Glucose, Bld: 151 mg/dL — ABNORMAL HIGH (ref 70–99)
Phosphorus: 5.8 mg/dL — ABNORMAL HIGH (ref 2.5–4.6)
Potassium: 4.5 mmol/L (ref 3.5–5.1)
Sodium: 141 mmol/L (ref 135–145)

## 2023-10-17 LAB — CBC WITH DIFFERENTIAL/PLATELET
Abs Immature Granulocytes: 0.07 10*3/uL (ref 0.00–0.07)
Basophils Absolute: 0.1 10*3/uL (ref 0.0–0.1)
Basophils Relative: 1 %
Eosinophils Absolute: 0.4 10*3/uL (ref 0.0–0.5)
Eosinophils Relative: 5 %
HCT: 23.7 % — ABNORMAL LOW (ref 36.0–46.0)
Hemoglobin: 7.4 g/dL — ABNORMAL LOW (ref 12.0–15.0)
Immature Granulocytes: 1 %
Lymphocytes Relative: 24 %
Lymphs Abs: 2.1 10*3/uL (ref 0.7–4.0)
MCH: 30.1 pg (ref 26.0–34.0)
MCHC: 31.2 g/dL (ref 30.0–36.0)
MCV: 96.3 fL (ref 80.0–100.0)
Monocytes Absolute: 0.7 10*3/uL (ref 0.1–1.0)
Monocytes Relative: 8 %
Neutro Abs: 5.4 10*3/uL (ref 1.7–7.7)
Neutrophils Relative %: 61 %
Platelets: 260 10*3/uL (ref 150–400)
RBC: 2.46 MIL/uL — ABNORMAL LOW (ref 3.87–5.11)
RDW: 13.5 % (ref 11.5–15.5)
WBC: 8.8 10*3/uL (ref 4.0–10.5)
nRBC: 0 % (ref 0.0–0.2)

## 2023-10-17 MED ORDER — AMLODIPINE BESYLATE 5 MG PO TABS
5.0000 mg | ORAL_TABLET | Freq: Every day | ORAL | Status: DC
  Administered 2023-10-18: 5 mg via ORAL
  Filled 2023-10-17: qty 1

## 2023-10-17 MED ORDER — CEFAZOLIN SODIUM-DEXTROSE 2-4 GM/100ML-% IV SOLN
2.0000 g | INTRAVENOUS | Status: DC
  Administered 2023-10-17 – 2023-10-20 (×2): 2 g via INTRAVENOUS
  Filled 2023-10-17 (×2): qty 100

## 2023-10-17 NOTE — Progress Notes (Signed)
 [facility] NEPHROLOGY PROGRESS NOTE  Assessment/ Plan: Pt is a 43 y.o. yo female  with medical hx of HTN, HLD, DMII, CKD V, HFpEF, CKD s/p LUE AVG as OP (Dr. Ronalee Belts at [facility]), with new ESRD.     # CKD stage V with progression to new ESRD - First HD on 3/26 via her AVG  -Continue dialysis TTS schedule, HD today here - Pt has been accepted at [facility] on TTS 12:00 chair time. Pt can start on Tuesday and will need to arrive at 11:00 am for paperwork prior to treatment.    # Hypertension: BP remains too low after d/c hydralazine, decrease amlodipine and have placed hold prior to HD order as well   # Heart failure with preserved EF  -Near euvolemic on exam   # Anemia of CKD - Continue  aranesp 100 mcg every week, monitor lab.  # Secondary hyperparathyroidism, Hypocalcemia: Treated with IV calcium.  Intact PTH 415. Calcitriol 0.5 mcg TID.  # Acute febrile illness due to UTI and bacteremia:  Blood culture with streptococcal mutans.  Currently on ceftriaxone.  Primary team has consulted ID - repeat cx NGTD < 24h.  TEE pending. AVG to be eval per ID. On exam I don't find s/s AVG infection  OK for d/c from nephrology perspective.   Subjective: Seen and examined.  Feeling better.  Denies nausea, vomiting, chest pain, shortness of breathing.  For HD today.   Objective Vital signs in last 24 hours: Vitals:   10/16/23 1607 10/16/23 1958 10/17/23 0100 10/17/23 0440  BP: 110/65 (!) 94/59 109/72 110/61  Pulse: 90 81 79 79  Resp: 18 18 18 18   Temp: 98.5 F (36.9 C) 98.5 F (36.9 C) 98.1 F (36.7 C) (!) 97 F (36.1 C)  TempSrc: Oral Oral Oral Oral  SpO2: 99% 98% 100% 96%  Weight:    81.6 kg  Height:       Weight change: 1.547 kg  Intake/Output Summary (Last 24 hours) at 10/17/2023 0747 Last data filed at 10/16/2023 1802 Gross per 24 hour  Intake 398 ml  Output --  Net 398 ml       Labs: RENAL PANEL Recent Labs  Lab 10/11/23 0920 10/11/23 1533 10/14/23 0223  10/14/23 0755 10/15/23 0253 10/16/23 0325 10/17/23 0401  NA 140   < > 139 138 138 140 141  K 3.7   < > 4.1 4.4 3.9 4.3 4.5  CL 111   < > 108 106 106 107 107  CO2 18*   < > 22 21* 24 25 22   GLUCOSE 82   < > 162* 116* 93 98 151*  BUN 60*   < > 38* 40* 27* 42* 49*  CREATININE 7.13*   < > 5.08* 5.25* 4.05* 5.13* 5.97*  CALCIUM 5.7*   < > 6.1* 6.5* 6.6* 6.5* 6.6*  MG 1.9  --   --   --   --   --   --   PHOS  --    < > 3.6 3.8 3.6 5.0* 5.8*  ALBUMIN  --    < > 2.2* 2.6* 2.1* 2.1* 2.4*   < > = values in this interval not displayed.    Liver Function Tests: Recent Labs  Lab 10/15/23 0253 10/16/23 0325 10/17/23 0401  ALBUMIN 2.1* 2.1* 2.4*   No results for input(s): "LIPASE", "AMYLASE" in the last 168 hours. No results for input(s): "AMMONIA" in the last 168 hours. CBC: Recent Labs  07/21/23 0925 10/11/23 0920 10/11/23 1930 10/12/23 0319 10/14/23 0223 10/14/23 0755 10/15/23 1009 10/16/23 0325 10/17/23 0401  HGB  --    < >  --    < > 6.7* 8.4* 7.9* 7.3* 7.4*  MCV  --    < >  --    < > 93.6 94.6 94.6 94.2 96.3  FERRITIN 323*  --  187  --   --   --   --   --   --   TIBC 210*  --  176*  --   --   --   --   --   --   IRON 72  --  41  --   --   --   --   --   --    < > = values in this interval not displayed.    Cardiac Enzymes: No results for input(s): "CKTOTAL", "CKMB", "CKMBINDEX", "TROPONINI" in the last 168 hours. CBG: Recent Labs  Lab 10/12/23 2139  GLUCAP 199*    Iron Studies:  No results for input(s): "IRON", "TIBC", "TRANSFERRIN", "FERRITIN" in the last 72 hours.  Studies/Results: ECHO TEE Result Date: 10/16/2023    TRANSESOPHOGEAL ECHO REPORT   Patient Name:   Jacqueline [name] Date of Exam: 10/16/2023 Medical Rec #:  [id number]    Height:       64.0 in Accession #:    [id number]   Weight:       176.6 lb Date of Birth:  July 11, 1981    BSA:          1.856 m Patient Age:    42 years     BP:           109/61 mmHg Patient Gender: F            HR:           80 bpm. Exam  Location:  Inpatient Procedure: 3D Echo, Transesophageal Echo, Saline Contrast Bubble Study, Cardiac            Doppler and Color Doppler (Both Spectral and Color Flow Doppler were            utilized during procedure). Indications:     Endocarditis  History:         Patient has prior history of Echocardiogram examinations, most                  recent 10/12/2023. CHF; Risk Factors:Hypertension and Diabetes.  Sonographer:     Rosaland Lao Sonographer#2:   Irving Burton Senior Referring Phys:  [id number] Corrin Parker Diagnosing Phys: Lennie Odor MD PROCEDURE: After discussion of the risks and benefits of a TEE, an informed consent was obtained from the patient. TEE procedure time was 12 minutes. The transesophogeal probe was passed without difficulty through the esophogus of the patient. Imaged were obtained with the patient in a left lateral decubitus position. Sedation performed by different physician. The patient was monitored while under deep sedation. Anesthestetic sedation was provided intravenously by Anesthesiology: 170mg  of Propofol, 80mg  of Lidocaine. Image quality was excellent. The patient's vital signs; including heart rate, blood pressure, and oxygen saturation; remained stable throughout the procedure. The patient developed no complications during the procedure.  IMPRESSIONS  1. Left ventricular ejection fraction, by estimation, is 65 to 70%. Left ventricular ejection fraction by 3D volume is 67 %. The left ventricle has normal function. The left ventricle has no regional wall motion abnormalities.  2. Right ventricular systolic function is normal. The  right ventricular size is normal.  3. No left atrial/left atrial appendage thrombus was detected.  4. The mitral valve is grossly normal. Trivial mitral valve regurgitation. No evidence of mitral stenosis.  5. The aortic valve is tricuspid. Aortic valve regurgitation is not visualized. No aortic stenosis is present.  6. Agitated saline contrast bubble study  was negative, with no evidence of any interatrial shunt.  7. 3D performed for the EF and demonstrates LVEF by 3D 67%. Conclusion(s)/Recommendation(s): No evidence of vegetation/infective endocarditis on this transesophageael echocardiogram. FINDINGS  Left Ventricle: Left ventricular ejection fraction, by estimation, is 65 to 70%. Left ventricular ejection fraction by 3D volume is 67 %. The left ventricle has normal function. The left ventricle has no regional wall motion abnormalities. The left ventricular internal cavity size was normal in size. Right Ventricle: The right ventricular size is normal. No increase in right ventricular wall thickness. Right ventricular systolic function is normal. Left Atrium: Left atrial size was normal in size. No left atrial/left atrial appendage thrombus was detected. Right Atrium: Right atrial size was normal in size. Pericardium: Trivial pericardial effusion is present. Mitral Valve: The mitral valve is grossly normal. Trivial mitral valve regurgitation. No evidence of mitral valve stenosis. There is no evidence of mitral valve vegetation. Tricuspid Valve: The tricuspid valve is grossly normal. Tricuspid valve regurgitation is trivial. No evidence of tricuspid stenosis. There is no evidence of tricuspid valve vegetation. Aortic Valve: The aortic valve is tricuspid. Aortic valve regurgitation is not visualized. No aortic stenosis is present. There is no evidence of aortic valve vegetation. Pulmonic Valve: The pulmonic valve was grossly normal. Pulmonic valve regurgitation is not visualized. No evidence of pulmonic stenosis. There is no evidence of pulmonic valve vegetation. Aorta: The aortic root and ascending aorta are structurally normal, with no evidence of dilitation. Venous: The left lower pulmonary vein, left upper pulmonary vein, right lower pulmonary vein and right upper pulmonary vein are normal. IAS/Shunts: The atrial septum is grossly normal. Agitated saline contrast  was given intravenously to evaluate for intracardiac shunting. Agitated saline contrast bubble study was negative, with no evidence of any interatrial shunt. Additional Comments: Spectral Doppler performed.  3D Volume EF LV 3D EF:    Left ventricular ejection fraction by 3D volume is 67              %.  3D Volume EF LV 3D EF:    67.20 % LV 3D EDV:   104800.00 mm LV 3D ESV:   34300.00 mm LV 3D SV:    70500.00 mm  3D Volume EF: 3D EF:        67 %  AORTA Ao Root diam: 2.98 cm Ao Asc diam:  3.00 cm Lennie Odor MD Electronically signed by Lennie Odor MD Signature Date/Time: 10/16/2023/2:05:15 PM    Final    EP STUDY Result Date: 10/16/2023 See surgical note for result.    Medications: Infusions:  cefTRIAXone (ROCEPHIN)  IV 2 g (10/16/23 2153)   promethazine (PHENERGAN) injection (IM or IVPB) 150 mL/hr at 10/16/23 0340    Scheduled Medications:  sodium chloride   Intravenous Once   amLODipine  10 mg Oral Daily   calcitRIOL  0.5 mcg Oral TID   Chlorhexidine Gluconate Cloth  6 each Topical Q0600   darbepoetin (ARANESP) injection - NON-DIALYSIS  100 mcg Subcutaneous Q Fri-1800   gabapentin  300 mg Oral QHS   metoprolol tartrate  25 mg Oral BID    have reviewed scheduled and prn medications.  Physical Exam: General:NAD, comfortable Heart:RRR, s1s2 nl Lungs:clear b/l, no crackle Abdomen:soft, Non-tender, non-distended Extremities:No edema Dialysis Access: AV graft with good thrill and bruit. No erythema, TTP, fluctuance.   Lillia Abed A Temisha Murley 10/17/2023,7:47 AM  LOS: 6 days

## 2023-10-17 NOTE — Progress Notes (Addendum)
 I have seen and examined the patient. I have personally reviewed the clinical findings, laboratory findings, microbiological data and imaging studies. The assessment and treatment plan was discussed with the Nurse Practitioner. I agree with her/his recommendations except following additions/corrections.  Afebrile Getting HD, comfortable, no concerns at Left AVG Complains of neck, b/l shoulder pain, no signs of septic shoulder joints. Unable to examine back due to getting HD and will reassess tomorrow.   Repeat blood cx NG in 2 days  Urine cx <10K, insignificant growth TEE negative for vegetation or endocarditis   Will switch ceftriaxone to cefazolin with HD Fu repeat blood cx for clearance Will reassess neck/back for MRI  Monitor CBC and CMP on abtx as well as metastatic sites of infection Universal/standard isolation precautions   I have personally spent 50 minutes involved in face-to-face and non-face-to-face activities for this patient on the day of the visit. Professional time spent includes the following activities: Preparing to see the patient (review of tests), Obtaining and/or reviewing separately obtained history (admission/discharge record), Performing a medically appropriate examination and/or evaluation , Ordering medications/tests/procedures, referring and communicating with other health care professionals, Documenting clinical information in the EMR, Independently interpreting results (not separately reported), Communicating results to the patient/family/caregiver, Counseling and educating the patient/family/caregiver and Care coordination (not separately reported).     [facility]  Date of Admission:  10/11/2023     Reason for Follow Up: Streptococcal bacteremia  Total days of antibiotics 4         ASSESSMENT:  Ms. Duchemin's transesophageal echocardiogram was negative for vegetation/endocarditis in the setting of Streptococcus mutans bacteremia  and hypocalcemia due to chronic kidney disease.  Source of infection remains unclear although complaining of neck/ back pain and may need to consider imaging.  Blood cultures from 10/15/2023 remain without growth to date.  Reviewed plan of care to continue current dose of cefazolin.  Tolerating antibiotics with no adverse side effects.  Continue hypocalcemia management and end-stage renal disease per nephrology.  Universal/standard precautions.  Remaining medical and supportive care per internal medicine.  PLAN:  Continue current dose of cefazolin. Monitor blood cultures for clearance of bacteremia. Hypocalcemia/end-stage renal disease per nephrology. Universal/standard precautions. Remaining medical and supportive care per internal medicine.  Principal Problem:   Hypocalcemia due to chronic kidney disease Active Problems:   Streptococcal bacteremia    sodium chloride   Intravenous Once   amLODipine  5 mg Oral Daily   calcitRIOL  0.5 mcg Oral TID   Chlorhexidine Gluconate Cloth  6 each Topical Q0600   darbepoetin (ARANESP) injection - NON-DIALYSIS  100 mcg Subcutaneous Q Fri-1800   gabapentin  300 mg Oral QHS   metoprolol tartrate  25 mg Oral BID    SUBJECTIVE:  Afebrile overnight with no acute events.  Upper back and neck pain rated 7 out of 10 in severity going on for at least 3 weeks.  No headaches. Tolerating antibiotics with no adverse side effects.  Denies fevers, chills, sweats.  Seen in dialysis.  No Known Allergies  Past Medical History:  Diagnosis Date   Anemia    Anxiety    Chronic bronchitis (HCC)    Chronic kidney disease    Hypertension    Increased frequency of headaches    Morbid obesity (HCC)    Necrotizing fasciitis (HCC)    Sleep apnea    does not use CPAP - - gastric bypass   Type II diabetes mellitus (HCC)    no  meds, diet controlled   Past Surgical History:  Procedure Laterality Date   AV FISTULA PLACEMENT Left 05/02/2023   Procedure: LEFT ARM  ARTERIOVENOUS (AV) FISTULA  GRAFT;  Surgeon: Daria Pastures, MD;  Location: [facility] OR;  Service: Vascular;  Laterality: Left;   BARIATRIC SURGERY     BREAST REDUCTION SURGERY  03/21/2017   CARDIAC CATHETERIZATION  01/06/2016   CARDIAC CATHETERIZATION N/A 01/06/2016   Procedure: Left Heart Cath and Coronary Angiography;  Surgeon: Iran Ouch, MD;  Location: [facility] INVASIVE CV LAB;  Service: Cardiovascular;  Laterality: N/A;   CESAREAN SECTION  08/2014   I & D EXTREMITY Right 06/18/2022   Procedure: IRRIGATION AND DEBRIDEMENT RIGHT ABDOMEN AND THIGH;  Surgeon: Kinsinger, De Blanch, MD;  Location: [facility] OR;  Service: General;  Laterality: Right;   I & D EXTREMITY Right 06/20/2022   Procedure: WOUND EXPLORATION OF RIGHT THIGH AND RIGHT GROIN WITH IRRIGATION AND DEBRIDEMENT;  Surgeon: Griselda Miner, MD;  Location: [facility] OR;  Service: General;  Laterality: Right;   REDUCTION MAMMAPLASTY Bilateral 03/21/2017   TRANSESOPHAGEAL ECHOCARDIOGRAM (CATH LAB) N/A 10/16/2023   Procedure: TRANSESOPHAGEAL ECHOCARDIOGRAM;  Surgeon: Sande Rives, MD;  Location: [facility] INVASIVE CV LAB;  Service: Cardiovascular;  Laterality: N/A;    Review of Systems: Review of Systems  Constitutional:  Negative for chills, fever and weight loss.  Respiratory:  Negative for cough, shortness of breath and wheezing.   Cardiovascular:  Negative for chest pain and leg swelling.  Gastrointestinal:  Negative for abdominal pain, constipation, diarrhea, nausea and vomiting.  Musculoskeletal:  Positive for neck pain.  Skin:  Negative for rash.      OBJECTIVE: Vitals:   10/17/23 0440 10/17/23 0857 10/17/23 0900 10/17/23 0930  BP: 110/61 115/70  120/69  Pulse: 79     Resp: 18     Temp: (!) 97 F (36.1 C)   98.6 F (37 C)  TempSrc: Oral   Oral  SpO2: 96%  100%   Weight: 81.6 kg  84.5 kg   Height:       Body mass index is 31.98 kg/m.  Physical Exam Constitutional:      General: She is not in acute distress.    Appearance:  She is well-developed.     Comments: Lying in bed with head of bed elevated; pleasant and receiving dialysis.  Cardiovascular:     Rate and Rhythm: Normal rate and regular rhythm.     Heart sounds: Normal heart sounds.  Pulmonary:     Effort: Pulmonary effort is normal.     Breath sounds: Normal breath sounds.  Skin:    General: Skin is warm and dry.  Neurological:     Mental Status: She is alert and oriented to person, place, and time.  Psychiatric:        Mood and Affect: Mood normal.     Lab Results Lab Results  Component Value Date   WBC 8.8 10/17/2023   HGB 7.4 (L) 10/17/2023   HCT 23.7 (L) 10/17/2023   MCV 96.3 10/17/2023   PLT 260 10/17/2023    Lab Results  Component Value Date   CREATININE 5.97 (H) 10/17/2023   BUN 49 (H) 10/17/2023   NA 141 10/17/2023   K 4.5 10/17/2023   CL 107 10/17/2023   CO2 22 10/17/2023    Lab Results  Component Value Date   ALT 16 08/29/2022   AST 14 (L) 08/29/2022   ALKPHOS 72 08/29/2022   BILITOT 0.6 08/29/2022  Microbiology: Recent Results (from the past 240 hours)  Resp panel by RT-PCR (RSV, Flu A&B, Covid) Anterior Nasal Swab     Status: None   Collection Time: 10/11/23  8:01 AM   Specimen: Anterior Nasal Swab  Result Value Ref Range Status   SARS Coronavirus 2 by RT PCR NEGATIVE NEGATIVE Final    Comment: (NOTE) SARS-CoV-2 target nucleic acids are NOT DETECTED.  The SARS-CoV-2 RNA is generally detectable in upper respiratory specimens during the acute phase of infection. The lowest concentration of SARS-CoV-2 viral copies this assay can detect is 138 copies/mL. A negative result does not preclude SARS-Cov-2 infection and should not be used as the sole basis for treatment or other patient management decisions. A negative result may occur with  improper specimen collection/handling, submission of specimen other than nasopharyngeal swab, presence of viral mutation(s) within the areas targeted by this assay, and  inadequate number of viral copies(<138 copies/mL). A negative result must be combined with clinical observations, patient history, and epidemiological information. The expected result is Negative.  Fact Sheet for Patients:  BloggerCourse.com  Fact Sheet for Healthcare Providers:  SeriousBroker.it  This test is no t yet approved or cleared by the [place] FDA and  has been authorized for detection and/or diagnosis of SARS-CoV-2 by FDA under an Emergency Use Authorization (EUA). This EUA will remain  in effect (meaning this test can be used) for the duration of the COVID-19 declaration under Section 564(b)(1) of the Act, 21 U.S.C.section 360bbb-3(b)(1), unless the authorization is terminated  or revoked sooner.       Influenza A by PCR NEGATIVE NEGATIVE Final   Influenza B by PCR NEGATIVE NEGATIVE Final    Comment: (NOTE) The Xpert Xpress SARS-CoV-2/FLU/RSV plus assay is intended as an aid in the diagnosis of influenza from Nasopharyngeal swab specimens and should not be used as a sole basis for treatment. Nasal washings and aspirates are unacceptable for Xpert Xpress SARS-CoV-2/FLU/RSV testing.  Fact Sheet for Patients: BloggerCourse.com  Fact Sheet for Healthcare Providers: SeriousBroker.it  This test is not yet approved or cleared by the [place] FDA and has been authorized for detection and/or diagnosis of SARS-CoV-2 by FDA under an Emergency Use Authorization (EUA). This EUA will remain in effect (meaning this test can be used) for the duration of the COVID-19 declaration under Section 564(b)(1) of the Act, 21 U.S.C. section 360bbb-3(b)(1), unless the authorization is terminated or revoked.     Resp Syncytial Virus by PCR NEGATIVE NEGATIVE Final    Comment: (NOTE) Fact Sheet for Patients: BloggerCourse.com  Fact Sheet for Healthcare  Providers: SeriousBroker.it  This test is not yet approved or cleared by the [place] FDA and has been authorized for detection and/or diagnosis of SARS-CoV-2 by FDA under an Emergency Use Authorization (EUA). This EUA will remain in effect (meaning this test can be used) for the duration of the COVID-19 declaration under Section 564(b)(1) of the Act, 21 U.S.C. section 360bbb-3(b)(1), unless the authorization is terminated or revoked.  Performed at [facility], [address] [place], [place] [address]   Culture, [place] Urine     Status: Abnormal   Collection Time: 10/13/23  5:19 PM   Specimen: Urine, Random  Result Value Ref Range Status   Specimen Description URINE, RANDOM  Final   Special Requests NONE  Final   Culture (A)  Final    <10,000 COLONIES/mL INSIGNIFICANT GROWTH NO GROUP B STREP (S.AGALACTIAE) ISOLATED Performed at [facility], [address] [place], [place] [address]    Report Status 10/15/2023 FINAL  Final  Culture, blood (Routine X 2) w Reflex to ID Panel     Status: Abnormal   Collection Time: 10/13/23  6:53 PM   Specimen: BLOOD  Result Value Ref Range Status   Specimen Description BLOOD SITE NOT SPECIFIED  Final   Special Requests   Final    BOTTLES DRAWN AEROBIC AND ANAEROBIC Blood Culture results may not be optimal due to an inadequate volume of blood received in culture bottles   Culture  Setup Time   Final    GRAM POSITIVE COCCI IN CHAINS IN BOTH AEROBIC AND ANAEROBIC BOTTLES CRITICAL RESULT CALLED TO, READ BACK BY AND VERIFIED WITH: PHARMD CAREN AMEND [id number] BY J RAZZAK,MT Performed at [facility], [address] [place], [place] [address]    Culture STREPTOCOCCUS MUTANS (A)  Final   Report Status 10/16/2023 FINAL  Final   Organism ID, Bacteria STREPTOCOCCUS MUTANS  Final      Susceptibility   Streptococcus mutans - MIC*    PENICILLIN <=0.06 SENSITIVE Sensitive     CEFTRIAXONE <=0.12  SENSITIVE Sensitive     ERYTHROMYCIN <=0.12 SENSITIVE Sensitive     LEVOFLOXACIN 2 SENSITIVE Sensitive     VANCOMYCIN 1 SENSITIVE Sensitive     * STREPTOCOCCUS MUTANS  Blood Culture ID Panel (Reflexed)     Status: Abnormal   Collection Time: 10/13/23  6:53 PM  Result Value Ref Range Status   Enterococcus faecalis NOT DETECTED NOT DETECTED Final   Enterococcus Faecium NOT DETECTED NOT DETECTED Final   Listeria monocytogenes NOT DETECTED NOT DETECTED Final   Staphylococcus species NOT DETECTED NOT DETECTED Final   Staphylococcus aureus (BCID) NOT DETECTED NOT DETECTED Final   Staphylococcus epidermidis NOT DETECTED NOT DETECTED Final   Staphylococcus lugdunensis NOT DETECTED NOT DETECTED Final   Streptococcus species DETECTED (A) NOT DETECTED Final    Comment: Not Enterococcus species, Streptococcus agalactiae, Streptococcus pyogenes, or Streptococcus pneumoniae. CRITICAL RESULT CALLED TO, READ BACK BY AND VERIFIED WITH: PHARMD CAREN AMEND [id number] BY J RAZZAK, MT    Streptococcus agalactiae NOT DETECTED NOT DETECTED Final   Streptococcus pneumoniae NOT DETECTED NOT DETECTED Final   Streptococcus pyogenes NOT DETECTED NOT DETECTED Final   A.calcoaceticus-baumannii NOT DETECTED NOT DETECTED Final   Bacteroides fragilis NOT DETECTED NOT DETECTED Final   Enterobacterales NOT DETECTED NOT DETECTED Final   Enterobacter cloacae complex NOT DETECTED NOT DETECTED Final   Escherichia coli NOT DETECTED NOT DETECTED Final   Klebsiella aerogenes NOT DETECTED NOT DETECTED Final   Klebsiella oxytoca NOT DETECTED NOT DETECTED Final   Klebsiella pneumoniae NOT DETECTED NOT DETECTED Final   Proteus species NOT DETECTED NOT DETECTED Final   Salmonella species NOT DETECTED NOT DETECTED Final   Serratia marcescens NOT DETECTED NOT DETECTED Final   Haemophilus influenzae NOT DETECTED NOT DETECTED Final   Neisseria meningitidis NOT DETECTED NOT DETECTED Final   Pseudomonas aeruginosa NOT DETECTED  NOT DETECTED Final   Stenotrophomonas maltophilia NOT DETECTED NOT DETECTED Final   Candida albicans NOT DETECTED NOT DETECTED Final   Candida auris NOT DETECTED NOT DETECTED Final   Candida glabrata NOT DETECTED NOT DETECTED Final   Candida krusei NOT DETECTED NOT DETECTED Final   Candida parapsilosis NOT DETECTED NOT DETECTED Final   Candida tropicalis NOT DETECTED NOT DETECTED Final   Cryptococcus neoformans/gattii NOT DETECTED NOT DETECTED Final    Comment: Performed at [facility],  [address] [place], [place] [address]  Culture, blood (Routine X 2) w Reflex to ID Panel     Status: Abnormal   Collection Time: 10/13/23  7:02 PM   Specimen: BLOOD  Result Value Ref Range Status   Specimen Description BLOOD SITE NOT SPECIFIED  Final   Special Requests   Final    BOTTLES DRAWN AEROBIC ONLY Blood Culture results may not be optimal due to an inadequate volume of blood received in culture bottles   Culture  Setup Time   Final    GRAM POSITIVE COCCI IN CHAINS AEROBIC BOTTLE ONLY CRITICAL VALUE NOTED.  VALUE IS CONSISTENT WITH PREVIOUSLY REPORTED AND CALLED VALUE.    Culture (A)  Final    STREPTOCOCCUS MUTANS SUSCEPTIBILITIES PERFORMED ON PREVIOUS CULTURE WITHIN THE LAST 5 DAYS. Performed at [facility], [address], [place], [place] [address]    Report Status 10/16/2023 FINAL  Final  Culture, blood (Routine X 2) w Reflex to ID Panel     Status: None (Preliminary result)   Collection Time: 10/15/23  2:21 PM   Specimen: BLOOD RIGHT ARM  Result Value Ref Range Status   Specimen Description BLOOD RIGHT ARM  Final   Special Requests   Final    BOTTLES DRAWN AEROBIC AND ANAEROBIC Blood Culture results may not be optimal due to an inadequate volume of blood received in culture bottles   Culture   Final    NO GROWTH 2 DAYS Performed at [facility], [address] [place], [place] [address]    Report Status PENDING  Incomplete  Culture, blood (Routine X 2) w  Reflex to ID Panel     Status: None (Preliminary result)   Collection Time: 10/15/23  2:21 PM   Specimen: BLOOD RIGHT HAND  Result Value Ref Range Status   Specimen Description BLOOD RIGHT HAND  Final   Special Requests   Final    BOTTLES DRAWN AEROBIC AND ANAEROBIC Blood Culture adequate volume   Culture   Final    NO GROWTH 2 DAYS Performed at [facility], [address] [place], [place] [address]    Report Status PENDING  Incomplete   Imaging ECHO TEE Result Date: 10/16/2023    TRANSESOPHOGEAL ECHO REPORT   Patient Name:   KEYLEN UZELAC Date of Exam: 10/16/2023 Medical Rec #:  [id number]    Height:       64.0 in Accession #:    [id number]   Weight:       176.6 lb Date of Birth:  1980/11/19    BSA:          1.856 m Patient Age:    42 years     BP:           109/61 mmHg Patient Gender: F            HR:           80 bpm. Exam Location:  Inpatient Procedure: 3D Echo, Transesophageal Echo, Saline Contrast Bubble Study, Cardiac            Doppler and Color Doppler (Both Spectral and Color Flow Doppler were            utilized during procedure). Indications:     Endocarditis  History:         Patient has prior history of Echocardiogram examinations, most  recent 10/12/2023. CHF; Risk Factors:Hypertension and Diabetes.  Sonographer:     Rosaland Lao Sonographer#2:   Irving Burton Senior Referring Phys:  [id number] Corrin Parker Diagnosing Phys: Lennie Odor MD PROCEDURE: After discussion of the risks and benefits of a TEE, an informed consent was obtained from the patient. TEE procedure time was 12 minutes. The transesophogeal probe was passed without difficulty through the esophogus of the patient. Imaged were obtained with the patient in a left lateral decubitus position. Sedation performed by different physician. The patient was monitored while under deep sedation. Anesthestetic sedation was provided intravenously by Anesthesiology: 170mg  of Propofol, 80mg  of Lidocaine. Image quality was  excellent. The patient's vital signs; including heart rate, blood pressure, and oxygen saturation; remained stable throughout the procedure. The patient developed no complications during the procedure.  IMPRESSIONS  1. Left ventricular ejection fraction, by estimation, is 65 to 70%. Left ventricular ejection fraction by 3D volume is 67 %. The left ventricle has normal function. The left ventricle has no regional wall motion abnormalities.  2. Right ventricular systolic function is normal. The right ventricular size is normal.  3. No left atrial/left atrial appendage thrombus was detected.  4. The mitral valve is grossly normal. Trivial mitral valve regurgitation. No evidence of mitral stenosis.  5. The aortic valve is tricuspid. Aortic valve regurgitation is not visualized. No aortic stenosis is present.  6. Agitated saline contrast bubble study was negative, with no evidence of any interatrial shunt.  7. 3D performed for the EF and demonstrates LVEF by 3D 67%. Conclusion(s)/Recommendation(s): No evidence of vegetation/infective endocarditis on this transesophageael echocardiogram. FINDINGS  Left Ventricle: Left ventricular ejection fraction, by estimation, is 65 to 70%. Left ventricular ejection fraction by 3D volume is 67 %. The left ventricle has normal function. The left ventricle has no regional wall motion abnormalities. The left ventricular internal cavity size was normal in size. Right Ventricle: The right ventricular size is normal. No increase in right ventricular wall thickness. Right ventricular systolic function is normal. Left Atrium: Left atrial size was normal in size. No left atrial/left atrial appendage thrombus was detected. Right Atrium: Right atrial size was normal in size. Pericardium: Trivial pericardial effusion is present. Mitral Valve: The mitral valve is grossly normal. Trivial mitral valve regurgitation. No evidence of mitral valve stenosis. There is no evidence of mitral valve  vegetation. Tricuspid Valve: The tricuspid valve is grossly normal. Tricuspid valve regurgitation is trivial. No evidence of tricuspid stenosis. There is no evidence of tricuspid valve vegetation. Aortic Valve: The aortic valve is tricuspid. Aortic valve regurgitation is not visualized. No aortic stenosis is present. There is no evidence of aortic valve vegetation. Pulmonic Valve: The pulmonic valve was grossly normal. Pulmonic valve regurgitation is not visualized. No evidence of pulmonic stenosis. There is no evidence of pulmonic valve vegetation. Aorta: The aortic root and ascending aorta are structurally normal, with no evidence of dilitation. Venous: The left lower pulmonary vein, left upper pulmonary vein, right lower pulmonary vein and right upper pulmonary vein are normal. IAS/Shunts: The atrial septum is grossly normal. Agitated saline contrast was given intravenously to evaluate for intracardiac shunting. Agitated saline contrast bubble study was negative, with no evidence of any interatrial shunt. Additional Comments: Spectral Doppler performed.  3D Volume EF LV 3D EF:    Left ventricular ejection fraction by 3D volume is 67              %.  3D Volume EF LV 3D EF:    67.20 %  LV 3D EDV:   104800.00 mm LV 3D ESV:   34300.00 mm LV 3D SV:    70500.00 mm  3D Volume EF: 3D EF:        67 %  AORTA Ao Root diam: 2.98 cm Ao Asc diam:  3.00 cm Lennie Odor MD Electronically signed by Lennie Odor MD Signature Date/Time: 10/16/2023/2:05:15 PM    Final    EP STUDY Result Date: 10/16/2023 See surgical note for result.  DG CHEST PORT 1 VIEW Result Date: 10/13/2023 CLINICAL DATA:  Fever. EXAM: PORTABLE CHEST 1 VIEW COMPARISON:  October 11, 2023. FINDINGS: Stable cardiomediastinal silhouette. Both lungs are clear. The visualized skeletal structures are unremarkable. IMPRESSION: No active disease. Electronically Signed   By: Lupita Raider M.D.   On: 10/13/2023 20:03   VAS [place] LOWER EXTREMITY VENOUS (DVT) Result  Date: 10/12/2023  Lower Venous DVT Study Patient Name:  KALY MCQUARY  Date of Exam:   10/12/2023 Medical Rec #: [id number]     Accession #:    [id number] Date of Birth: 1981/03/07     Patient Gender: F Patient Age:   19 years Exam Location:  [facility] Procedure:      VAS [place] LOWER EXTREMITY VENOUS (DVT) Referring Phys: [facility] EZENDUKA --------------------------------------------------------------------------------  Indications: Edema.  Risk Factors: Past pregnancy. Comparison Study: No significant changes seen since the previous exam 08/31/22. Performing Technologist: Shona Simpson  Examination Guidelines: A complete evaluation includes B-mode imaging, spectral Doppler, color Doppler, and power Doppler as needed of all accessible portions of each vessel. Bilateral testing is considered an integral part of a complete examination. Limited examinations for reoccurring indications may be performed as noted. The reflux portion of the exam is performed with the patient in reverse Trendelenburg.  +---------+---------------+---------+-----------+----------+--------------+ RIGHT    CompressibilityPhasicitySpontaneityPropertiesThrombus Aging +---------+---------------+---------+-----------+----------+--------------+ CFV      Full           Yes      Yes                                 +---------+---------------+---------+-----------+----------+--------------+ SFJ      Full                                                        +---------+---------------+---------+-----------+----------+--------------+ FV Prox  Full                                                        +---------+---------------+---------+-----------+----------+--------------+ FV Mid   Full                                                        +---------+---------------+---------+-----------+----------+--------------+ FV DistalFull                    Yes                                  +---------+---------------+---------+-----------+----------+--------------+  PFV      Full                                                        +---------+---------------+---------+-----------+----------+--------------+ POP      Full           Yes      Yes                                 +---------+---------------+---------+-----------+----------+--------------+ PTV      Full                                                        +---------+---------------+---------+-----------+----------+--------------+ PERO     Full                                                        +---------+---------------+---------+-----------+----------+--------------+   +---------+---------------+---------+-----------+----------+--------------+ LEFT     CompressibilityPhasicitySpontaneityPropertiesThrombus Aging +---------+---------------+---------+-----------+----------+--------------+ CFV      Full           Yes      Yes                                 +---------+---------------+---------+-----------+----------+--------------+ SFJ      Full                                                        +---------+---------------+---------+-----------+----------+--------------+ FV Prox  Full                                                        +---------+---------------+---------+-----------+----------+--------------+ FV Mid   Full                                                        +---------+---------------+---------+-----------+----------+--------------+ FV DistalFull                                                        +---------+---------------+---------+-----------+----------+--------------+ PFV      Full                                                        +---------+---------------+---------+-----------+----------+--------------+  POP      Full           Yes      Yes                                  +---------+---------------+---------+-----------+----------+--------------+ PTV      Full                                                        +---------+---------------+---------+-----------+----------+--------------+ PERO     Full                                                        +---------+---------------+---------+-----------+----------+--------------+     Summary: BILATERAL: - No evidence of deep vein thrombosis seen in the lower extremities, bilaterally. -No evidence of popliteal cyst, bilaterally.   *See table(s) above for measurements and observations. Electronically signed by Heath Lark on 10/12/2023 at 6:44:55 PM.    Final    ECHOCARDIOGRAM COMPLETE Result Date: 10/12/2023    ECHOCARDIOGRAM REPORT   Patient Name:   SHIANN KAM Date of Exam: 10/12/2023 Medical Rec #:  [id number]    Height:       64.0 in Accession #:    [id number]   Weight:       175.0 lb Date of Birth:  [birth year]    BSA:          1.848 m Patient Age:    42 years     BP:           125/75 mmHg Patient Gender: F            HR:           103 bpm. Exam Location:  Inpatient Procedure: 2D Echo, Cardiac Doppler and Color Doppler (Both Spectral and Color            Flow Doppler were utilized during procedure). Indications:    Dyspnea  History:        Patient has prior history of Echocardiogram examinations, most                 recent 07/05/2019. CHF, Arrythmias:Atrial Fibrillation; Risk                 Factors:Diabetes.  Sonographer:    Amy Chionchio Referring Phys: [id number] Monica Martinez EZENDUKA IMPRESSIONS  1. Left ventricular ejection fraction, by estimation, is 60 to 65%. The left ventricle has normal function. The left ventricle has no regional wall motion abnormalities. There is moderate left ventricular hypertrophy. Left ventricular diastolic parameters are consistent with Grade I diastolic dysfunction (impaired relaxation).  2. Right ventricular systolic function is normal. The right ventricular size is normal.  3.  The mitral valve is normal in structure. Trivial mitral valve regurgitation. No evidence of mitral stenosis.  4. The aortic valve is normal in structure. Aortic valve regurgitation is not visualized. No aortic stenosis is present.  5. The inferior vena cava is normal in size with greater than 50% respiratory variability, suggesting right atrial pressure of 3 mmHg. FINDINGS  Left Ventricle: Left ventricular ejection fraction, by  estimation, is 60 to 65%. The left ventricle has normal function. The left ventricle has no regional wall motion abnormalities. The left ventricular internal cavity size was normal in size. There is  moderate left ventricular hypertrophy. Left ventricular diastolic parameters are consistent with Grade I diastolic dysfunction (impaired relaxation). Right Ventricle: The right ventricular size is normal. No increase in right ventricular wall thickness. Right ventricular systolic function is normal. Left Atrium: Left atrial size was normal in size. Right Atrium: Right atrial size was normal in size. Pericardium: There is no evidence of pericardial effusion. Mitral Valve: The mitral valve is normal in structure. Trivial mitral valve regurgitation. No evidence of mitral valve stenosis. MV peak gradient, 8.2 mmHg. The mean mitral valve gradient is 4.0 mmHg. Tricuspid Valve: The tricuspid valve is normal in structure. Tricuspid valve regurgitation is not demonstrated. No evidence of tricuspid stenosis. Aortic Valve: The aortic valve is normal in structure. Aortic valve regurgitation is not visualized. No aortic stenosis is present. Aortic valve mean gradient measures 9.0 mmHg. Aortic valve peak gradient measures 14.6 mmHg. Aortic valve area, by VTI measures 3.00 cm. Pulmonic Valve: The pulmonic valve was normal in structure. Pulmonic valve regurgitation is not visualized. No evidence of pulmonic stenosis. Aorta: The aortic root is normal in size and structure. Venous: The inferior vena cava is  normal in size with greater than 50% respiratory variability, suggesting right atrial pressure of 3 mmHg. IAS/Shunts: No atrial level shunt detected by color flow Doppler.  LEFT VENTRICLE PLAX 2D LVIDd:         3.40 cm     Diastology LVIDs:         1.90 cm     LV e' medial:    6.09 cm/s LV PW:         1.30 cm     LV E/e' medial:  14.5 LV IVS:        1.40 cm     LV e' lateral:   7.46 cm/s LVOT diam:     2.10 cm     LV E/e' lateral: 11.8 LV SV:         88 LV SV Index:   48 LVOT Area:     3.46 cm  LV Volumes (MOD) LV vol d, MOD A2C: 68.8 ml LV vol d, MOD A4C: 99.7 ml LV vol s, MOD A2C: 14.6 ml LV vol s, MOD A4C: 29.6 ml LV SV MOD A2C:     54.2 ml LV SV MOD A4C:     99.7 ml LV SV MOD BP:      64.1 ml RIGHT VENTRICLE          IVC RV Basal diam:  2.50 cm  IVC diam: 1.50 cm TAPSE (M-mode): 2.3 cm LEFT ATRIUM             Index        RIGHT ATRIUM           Index LA Vol (A2C):   39.1 ml 21.15 ml/m  RA Area:     12.40 cm LA Vol (A4C):   54.5 ml 29.48 ml/m  RA Volume:   25.90 ml  14.01 ml/m LA Biplane Vol: 48.1 ml 26.02 ml/m  AORTIC VALVE                     PULMONIC VALVE AV Area (Vmax):    2.90 cm      PV Vmax:       1.27 m/s AV  Area (Vmean):   2.76 cm      PV Peak grad:  6.5 mmHg AV Area (VTI):     3.00 cm AV Vmax:           191.00 cm/s AV Vmean:          147.000 cm/s AV VTI:            0.293 m AV Peak Grad:      14.6 mmHg AV Mean Grad:      9.0 mmHg LVOT Vmax:         160.00 cm/s LVOT Vmean:        117.000 cm/s LVOT VTI:          0.254 m LVOT/AV VTI ratio: 0.87  AORTA Ao Root diam: 3.30 cm Ao Asc diam:  3.50 cm MITRAL VALVE MV Area (PHT): 4.39 cm     SHUNTS MV Area VTI:   3.37 cm     Systemic VTI:  0.25 m MV Peak grad:  8.2 mmHg     Systemic Diam: 2.10 cm MV Mean grad:  4.0 mmHg MV Vmax:       1.43 m/s MV Vmean:      98.8 cm/s MV Decel Time: 173 msec MV E velocity: 88.10 cm/s MV A velocity: 104.00 cm/s MV E/A ratio:  0.85 Donato Schultz MD Electronically signed by Donato Schultz MD Signature Date/Time:  10/12/2023/2:32:28 PM    Final    DG Chest 2 View Result Date: 10/11/2023 CLINICAL DATA:  Midline chest pain. EXAM: CHEST - 2 VIEW COMPARISON:  08/29/2022. FINDINGS: Bilateral lung fields are clear. Bilateral costophrenic angles are clear. Note is made of elevated right hemidiaphragm. Normal cardio-mediastinal silhouette. No acute osseous abnormalities. The soft tissues are within normal limits. IMPRESSION: No active cardiopulmonary disease. Electronically Signed   By: Jules Schick M.D.   On: 10/11/2023 09:42    I have personally spent 25 minutes involved in face-to-face and non-face-to-face activities for this patient on the day of the visit. Professional time spent includes the following activities: Preparing to see the patient (review of tests), Obtaining and/or reviewing separately obtained history (admission/discharge record), Performing a medically appropriate examination and/or evaluation , Ordering medications/tests/procedures, referring and communicating with other health care professionals, Documenting clinical information in the EMR, Independently interpreting results (not separately reported), Communicating results to the patient/family/caregiver, Counseling and educating the patient/family/caregiver and Care coordination (not separately reported).   Marcos Eke, NP [facility]  10/17/2023  10:29 AM

## 2023-10-17 NOTE — Progress Notes (Signed)
 PHARMACY CONSULT NOTE FOR:  OUTPATIENT  PARENTERAL ANTIBIOTIC THERAPY (OPAT)  Informational only as the patient will receive antibiotics with hemodialysis outpatient  Indication: Strep mutans bacteremia Regimen: Cefazolin 2g/HD-TTS End date: 10/28/23 (2 weeks from neg BCx 3/30)  IV antibiotic discharge orders are pended. To discharging provider:  please sign these orders via discharge navigator,  Select New Orders & click on the button choice - Manage This Unsigned Work.     Thank you for allowing pharmacy to be a part of this patient's care.  Georgina Pillion, PharmD, BCPS, BCIDP Infectious Diseases Clinical Pharmacist 10/19/2023 8:53 AM   **Pharmacist phone directory can now be found on [email or web address] ([email or web address]).  Listed under [facility].

## 2023-10-17 NOTE — Progress Notes (Signed)
 PROGRESS NOTE    Medrith Veillon  ZOX:[id number] DOB: 23-Oct-1980 DOA: 10/11/2023 PCP: [place], MD   Brief Narrative: Jacqueline [name] is a 43 y.o. female with a history of hypertension, hyperlipidemia, diabetes mellitus type 2, CKD stage V, chronic heart failure.  Patient presented secondary to most complaints including generalized weakness, fevers, URI symptoms, nausea and vomiting, loose stools.  Patient found to have evidence of severe hypocalcemia on admission complicated by chronic renal disease which has progressed to ESRD.  Calcium supplemented during admission.  During admission, fever/infectious workup was significant for evidence of Streptococcus bacteremia.  Infectious disease consulted.  Patient was started empirically on ceftriaxone with final cultures significant for Streptococcus nutans infection.  Unclear source for bacteremia.  TTE and TEE were negative for source.  Ceftriaxone transition to cefazolin IV per ID recommendations.   Assessment and Plan:  Hypocalcemia Calcium of 5.7 (corrected to 6.7 to account for hypoalbuminemia). Patient given calcium gluconate and managed with peritoneal dialysis. Calcium improved to 6.6 (corrected calcium of 7.9). Continue management per nephrology -Continue calcitriol  Streptococcus mutans bacteremia Unclear source. Infectious disease consulted. Ceftriaxone started empirically and transitioned to Cefazolin after culture data became available. Transthoracic Echocardiogram and Transesophageal Echocardiogram without evidence of vegetation. -ID recommendations: continue Cefazolin IV  UTI Ruled out with insignificant growth on urine culture. Does not appear to be the source for bacteremia.  CKD stage V progressed to ESRD Patient started on hemodialysis this admission. Management per nephology. Patient has been set up with outpatient hemodialysis with schedule of Tuesday, Thursday, Saturday.  Diabetes mellitus type 2 Well controlled.  A1C of 4.4%.  Chronic HFpEF Stable. Fluid management with hemodialysis.   DVT prophylaxis: SCDs Code Status:   Code Status: Full Code Family Communication: None at bedside Disposition Plan: Discharge pending ID final recommendations for outpatient antibiotic regimen and ongoing management/workup   Consultants:  Nephrology Infectious disease  Procedures:  Hemodialysis  Antimicrobials: Cefazolin  Ceftriaxone   Subjective: Patient reports no concerns this morning. Feels well. Hoping for discharge soon.  Objective: BP 123/70 (BP Location: Right Arm)   Pulse 84   Temp 98 F (36.7 C) (Oral)   Resp 20   Ht 5\' 4"  (1.626 m)   Wt 83.3 kg   LMP 10/09/2023 (Exact Date)   SpO2 100%   BMI 31.52 kg/m   Examination:  General exam: Appears calm and comfortable. Currently receiving HD Respiratory system: Clear to auscultation. Respiratory effort normal. Cardiovascular system: S1 & S2 heard, RRR. Gastrointestinal system: Abdomen is nondistended, soft and nontender. Normal bowel sounds heard. Central nervous system: Alert and oriented. No focal neurological deficits. Psychiatry: Judgement and insight appear normal. Mood & affect appropriate.    Data Reviewed: I have personally reviewed following labs and imaging studies  CBC Lab Results  Component Value Date   WBC 8.8 10/17/2023   RBC 2.46 (L) 10/17/2023   HGB 7.4 (L) 10/17/2023   HCT 23.7 (L) 10/17/2023   MCV 96.3 10/17/2023   MCH 30.1 10/17/2023   PLT 260 10/17/2023   MCHC 31.2 10/17/2023   RDW 13.5 10/17/2023   LYMPHSABS 2.1 10/17/2023   MONOABS 0.7 10/17/2023   EOSABS 0.4 10/17/2023   BASOSABS 0.1 10/17/2023     Last metabolic panel Lab Results  Component Value Date   NA 141 10/17/2023   K 4.5 10/17/2023   CL 107 10/17/2023   CO2 22 10/17/2023   BUN 49 (H) 10/17/2023   CREATININE 5.97 (H) 10/17/2023   GLUCOSE 151 (H)  10/17/2023   GFRNONAA 8 (L) 10/17/2023   GFRAA 31 (L) 12/03/2019   CALCIUM 6.6 (L)  10/17/2023   PHOS 5.8 (H) 10/17/2023   PROT 7.1 08/29/2022   ALBUMIN 2.4 (L) 10/17/2023   BILITOT 0.6 08/29/2022   ALKPHOS 72 08/29/2022   AST 14 (L) 08/29/2022   ALT 16 08/29/2022   ANIONGAP 12 10/17/2023    GFR: Estimated Creatinine Clearance: 12.7 mL/min (A) (by C-G formula based on SCr of 5.97 mg/dL (H)).  Recent Results (from the past 240 hours)  Resp panel by RT-PCR (RSV, Flu A&B, Covid) Anterior Nasal Swab     Status: None   Collection Time: 10/11/23  8:01 AM   Specimen: Anterior Nasal Swab  Result Value Ref Range Status   SARS Coronavirus 2 by RT PCR NEGATIVE NEGATIVE Final    Comment: (NOTE) SARS-CoV-2 target nucleic acids are NOT DETECTED.  The SARS-CoV-2 RNA is generally detectable in upper respiratory specimens during the acute phase of infection. The lowest concentration of SARS-CoV-2 viral copies this assay can detect is 138 copies/mL. A negative result does not preclude SARS-Cov-2 infection and should not be used as the sole basis for treatment or other patient management decisions. A negative result may occur with  improper specimen collection/handling, submission of specimen other than nasopharyngeal swab, presence of viral mutation(s) within the areas targeted by this assay, and inadequate number of viral copies(<138 copies/mL). A negative result must be combined with clinical observations, patient history, and epidemiological information. The expected result is Negative.  Fact Sheet for Patients:  BloggerCourse.com  Fact Sheet for Healthcare Providers:  SeriousBroker.it  This test is no t yet approved or cleared by the [place] FDA and  has been authorized for detection and/or diagnosis of SARS-CoV-2 by FDA under an Emergency Use Authorization (EUA). This EUA will remain  in effect (meaning this test can be used) for the duration of the COVID-19 declaration under Section 564(b)(1) of the Act,  21 U.S.C.section 360bbb-3(b)(1), unless the authorization is terminated  or revoked sooner.       Influenza A by PCR NEGATIVE NEGATIVE Final   Influenza B by PCR NEGATIVE NEGATIVE Final    Comment: (NOTE) The Xpert Xpress SARS-CoV-2/FLU/RSV plus assay is intended as an aid in the diagnosis of influenza from Nasopharyngeal swab specimens and should not be used as a sole basis for treatment. Nasal washings and aspirates are unacceptable for Xpert Xpress SARS-CoV-2/FLU/RSV testing.  Fact Sheet for Patients: BloggerCourse.com  Fact Sheet for Healthcare Providers: SeriousBroker.it  This test is not yet approved or cleared by the [place] FDA and has been authorized for detection and/or diagnosis of SARS-CoV-2 by FDA under an Emergency Use Authorization (EUA). This EUA will remain in effect (meaning this test can be used) for the duration of the COVID-19 declaration under Section 564(b)(1) of the Act, 21 U.S.C. section 360bbb-3(b)(1), unless the authorization is terminated or revoked.     Resp Syncytial Virus by PCR NEGATIVE NEGATIVE Final    Comment: (NOTE) Fact Sheet for Patients: BloggerCourse.com  Fact Sheet for Healthcare Providers: SeriousBroker.it  This test is not yet approved or cleared by the [place] FDA and has been authorized for detection and/or diagnosis of SARS-CoV-2 by FDA under an Emergency Use Authorization (EUA). This EUA will remain in effect (meaning this test can be used) for the duration of the COVID-19 declaration under Section 564(b)(1) of the Act, 21 U.S.C. section 360bbb-3(b)(1), unless the authorization is terminated or revoked.  Performed at [facility], [address] [place], [place] [address]   Culture, [place] Urine     Status: Abnormal   Collection Time: 10/13/23  5:19 PM   Specimen: Urine, Random  Result Value Ref Range  Status   Specimen Description URINE, RANDOM  Final   Special Requests NONE  Final   Culture (A)  Final    <10,000 COLONIES/mL INSIGNIFICANT GROWTH NO GROUP B STREP (S.AGALACTIAE) ISOLATED Performed at [facility], [address], [place], [place] [address]    Report Status 10/15/2023 FINAL  Final  Culture, blood (Routine X 2) w Reflex to ID Panel     Status: Abnormal   Collection Time: 10/13/23  6:53 PM   Specimen: BLOOD  Result Value Ref Range Status   Specimen Description BLOOD SITE NOT SPECIFIED  Final   Special Requests   Final    BOTTLES DRAWN AEROBIC AND ANAEROBIC Blood Culture results may not be optimal due to an inadequate volume of blood received in culture bottles   Culture  Setup Time   Final    GRAM POSITIVE COCCI IN CHAINS IN BOTH AEROBIC AND ANAEROBIC BOTTLES CRITICAL RESULT CALLED TO, READ BACK BY AND VERIFIED WITH: PHARMD CAREN AMEND [id number] BY J RAZZAK,MT Performed at [facility], [address], [place], [place] [address]    Culture STREPTOCOCCUS MUTANS (A)  Final   Report Status 10/16/2023 FINAL  Final   Organism ID, Bacteria STREPTOCOCCUS MUTANS  Final      Susceptibility   Streptococcus mutans - MIC*    PENICILLIN <=0.06 SENSITIVE Sensitive     CEFTRIAXONE <=0.12 SENSITIVE Sensitive     ERYTHROMYCIN <=0.12 SENSITIVE Sensitive     LEVOFLOXACIN 2 SENSITIVE Sensitive     VANCOMYCIN 1 SENSITIVE Sensitive     * STREPTOCOCCUS MUTANS  Blood Culture ID Panel (Reflexed)     Status: Abnormal   Collection Time: 10/13/23  6:53 PM  Result Value Ref Range Status   Enterococcus faecalis NOT DETECTED NOT DETECTED Final   Enterococcus Faecium NOT DETECTED NOT DETECTED Final   Listeria monocytogenes NOT DETECTED NOT DETECTED Final   Staphylococcus species NOT DETECTED NOT DETECTED Final   Staphylococcus aureus (BCID) NOT DETECTED NOT DETECTED Final   Staphylococcus epidermidis NOT DETECTED NOT DETECTED Final   Staphylococcus lugdunensis NOT DETECTED  NOT DETECTED Final   Streptococcus species DETECTED (A) NOT DETECTED Final    Comment: Not Enterococcus species, Streptococcus agalactiae, Streptococcus pyogenes, or Streptococcus pneumoniae. CRITICAL RESULT CALLED TO, READ BACK BY AND VERIFIED WITH: PHARMD CAREN AMEND [id number] BY J RAZZAK, MT    Streptococcus agalactiae NOT DETECTED NOT DETECTED Final   Streptococcus pneumoniae NOT DETECTED NOT DETECTED Final   Streptococcus pyogenes NOT DETECTED NOT DETECTED Final   A.calcoaceticus-baumannii NOT DETECTED NOT DETECTED Final   Bacteroides fragilis NOT DETECTED NOT DETECTED Final   Enterobacterales NOT DETECTED NOT DETECTED Final   Enterobacter cloacae complex NOT DETECTED NOT DETECTED Final   Escherichia coli NOT DETECTED NOT DETECTED Final   Klebsiella aerogenes NOT DETECTED NOT DETECTED Final   Klebsiella oxytoca NOT DETECTED NOT DETECTED Final   Klebsiella pneumoniae NOT DETECTED NOT DETECTED Final   Proteus species NOT DETECTED NOT DETECTED Final   Salmonella species NOT DETECTED NOT DETECTED Final   Serratia marcescens NOT DETECTED NOT DETECTED Final   Haemophilus influenzae NOT DETECTED NOT DETECTED Final   Neisseria meningitidis NOT DETECTED NOT DETECTED Final   Pseudomonas aeruginosa NOT DETECTED NOT DETECTED Final  Stenotrophomonas maltophilia NOT DETECTED NOT DETECTED Final   Candida albicans NOT DETECTED NOT DETECTED Final   Candida auris NOT DETECTED NOT DETECTED Final   Candida glabrata NOT DETECTED NOT DETECTED Final   Candida krusei NOT DETECTED NOT DETECTED Final   Candida parapsilosis NOT DETECTED NOT DETECTED Final   Candida tropicalis NOT DETECTED NOT DETECTED Final   Cryptococcus neoformans/gattii NOT DETECTED NOT DETECTED Final    Comment: Performed at [facility], [address], [place], [place] [address]  Culture, blood (Routine X 2) w Reflex to ID Panel     Status: Abnormal   Collection Time: 10/13/23  7:02 PM   Specimen: BLOOD  Result Value  Ref Range Status   Specimen Description BLOOD SITE NOT SPECIFIED  Final   Special Requests   Final    BOTTLES DRAWN AEROBIC ONLY Blood Culture results may not be optimal due to an inadequate volume of blood received in culture bottles   Culture  Setup Time   Final    GRAM POSITIVE COCCI IN CHAINS AEROBIC BOTTLE ONLY CRITICAL VALUE NOTED.  VALUE IS CONSISTENT WITH PREVIOUSLY REPORTED AND CALLED VALUE.    Culture (A)  Final    STREPTOCOCCUS MUTANS SUSCEPTIBILITIES PERFORMED ON PREVIOUS CULTURE WITHIN THE LAST 5 DAYS. Performed at [facility], [address], [place], [place] [address]    Report Status 10/16/2023 FINAL  Final  Culture, blood (Routine X 2) w Reflex to ID Panel     Status: None (Preliminary result)   Collection Time: 10/15/23  2:21 PM   Specimen: BLOOD RIGHT ARM  Result Value Ref Range Status   Specimen Description BLOOD RIGHT ARM  Final   Special Requests   Final    BOTTLES DRAWN AEROBIC AND ANAEROBIC Blood Culture results may not be optimal due to an inadequate volume of blood received in culture bottles   Culture   Final    NO GROWTH 2 DAYS Performed at [facility], [address], [place], [place] [address]    Report Status PENDING  Incomplete  Culture, blood (Routine X 2) w Reflex to ID Panel     Status: None (Preliminary result)   Collection Time: 10/15/23  2:21 PM   Specimen: BLOOD RIGHT HAND  Result Value Ref Range Status   Specimen Description BLOOD RIGHT HAND  Final   Special Requests   Final    BOTTLES DRAWN AEROBIC AND ANAEROBIC Blood Culture adequate volume   Culture   Final    NO GROWTH 2 DAYS Performed at [facility], [address] [place], [place] [address]    Report Status PENDING  Incomplete      Radiology Studies: ECHO TEE Result Date: 10/16/2023    TRANSESOPHOGEAL ECHO REPORT   Patient Name:   QUINLEE SCIARRA Date of Exam: 10/16/2023 Medical Rec #:  [id number]    Height:       64.0 in Accession #:    [id number]   Weight:        176.6 lb Date of Birth:  1980/09/15    BSA:          1.856 m Patient Age:    42 years     BP:           109/61 mmHg Patient Gender: F            HR:           80 bpm. Exam Location:  Inpatient Procedure: 3D  Echo, Transesophageal Echo, Saline Contrast Bubble Study, Cardiac            Doppler and Color Doppler (Both Spectral and Color Flow Doppler were            utilized during procedure). Indications:     Endocarditis  History:         Patient has prior history of Echocardiogram examinations, most                  recent 10/12/2023. CHF; Risk Factors:Hypertension and Diabetes.  Sonographer:     Rosaland Lao Sonographer#2:   Irving Burton Senior Referring Phys:  [id number] Corrin Parker Diagnosing Phys: Lennie Odor MD PROCEDURE: After discussion of the risks and benefits of a TEE, an informed consent was obtained from the patient. TEE procedure time was 12 minutes. The transesophogeal probe was passed without difficulty through the esophogus of the patient. Imaged were obtained with the patient in a left lateral decubitus position. Sedation performed by different physician. The patient was monitored while under deep sedation. Anesthestetic sedation was provided intravenously by Anesthesiology: 170mg  of Propofol, 80mg  of Lidocaine. Image quality was excellent. The patient's vital signs; including heart rate, blood pressure, and oxygen saturation; remained stable throughout the procedure. The patient developed no complications during the procedure.  IMPRESSIONS  1. Left ventricular ejection fraction, by estimation, is 65 to 70%. Left ventricular ejection fraction by 3D volume is 67 %. The left ventricle has normal function. The left ventricle has no regional wall motion abnormalities.  2. Right ventricular systolic function is normal. The right ventricular size is normal.  3. No left atrial/left atrial appendage thrombus was detected.  4. The mitral valve is grossly normal. Trivial mitral valve regurgitation. No  evidence of mitral stenosis.  5. The aortic valve is tricuspid. Aortic valve regurgitation is not visualized. No aortic stenosis is present.  6. Agitated saline contrast bubble study was negative, with no evidence of any interatrial shunt.  7. 3D performed for the EF and demonstrates LVEF by 3D 67%. Conclusion(s)/Recommendation(s): No evidence of vegetation/infective endocarditis on this transesophageael echocardiogram. FINDINGS  Left Ventricle: Left ventricular ejection fraction, by estimation, is 65 to 70%. Left ventricular ejection fraction by 3D volume is 67 %. The left ventricle has normal function. The left ventricle has no regional wall motion abnormalities. The left ventricular internal cavity size was normal in size. Right Ventricle: The right ventricular size is normal. No increase in right ventricular wall thickness. Right ventricular systolic function is normal. Left Atrium: Left atrial size was normal in size. No left atrial/left atrial appendage thrombus was detected. Right Atrium: Right atrial size was normal in size. Pericardium: Trivial pericardial effusion is present. Mitral Valve: The mitral valve is grossly normal. Trivial mitral valve regurgitation. No evidence of mitral valve stenosis. There is no evidence of mitral valve vegetation. Tricuspid Valve: The tricuspid valve is grossly normal. Tricuspid valve regurgitation is trivial. No evidence of tricuspid stenosis. There is no evidence of tricuspid valve vegetation. Aortic Valve: The aortic valve is tricuspid. Aortic valve regurgitation is not visualized. No aortic stenosis is present. There is no evidence of aortic valve vegetation. Pulmonic Valve: The pulmonic valve was grossly normal. Pulmonic valve regurgitation is not visualized. No evidence of pulmonic stenosis. There is no evidence of pulmonic valve vegetation. Aorta: The aortic root and ascending aorta are structurally normal, with no evidence of dilitation. Venous: The left lower  pulmonary vein, left upper pulmonary vein, right lower pulmonary vein and right upper  pulmonary vein are normal. IAS/Shunts: The atrial septum is grossly normal. Agitated saline contrast was given intravenously to evaluate for intracardiac shunting. Agitated saline contrast bubble study was negative, with no evidence of any interatrial shunt. Additional Comments: Spectral Doppler performed.  3D Volume EF LV 3D EF:    Left ventricular ejection fraction by 3D volume is 67              %.  3D Volume EF LV 3D EF:    67.20 % LV 3D EDV:   104800.00 mm LV 3D ESV:   34300.00 mm LV 3D SV:    70500.00 mm  3D Volume EF: 3D EF:        67 %  AORTA Ao Root diam: 2.98 cm Ao Asc diam:  3.00 cm Lennie Odor MD Electronically signed by Lennie Odor MD Signature Date/Time: 10/16/2023/2:05:15 PM    Final    EP STUDY Result Date: 10/16/2023 See surgical note for result.     LOS: 6 days    Jacquelin Hawking, MD [facility] 10/17/2023, 3:26 PM   If 7PM-7AM, please contact night-coverage [email or web address]

## 2023-10-17 NOTE — Plan of Care (Signed)
  Problem: Health Behavior/Discharge Planning: Goal: Ability to manage health-related needs will improve Outcome: Progressing   Problem: Clinical Measurements: Goal: Ability to maintain clinical measurements within normal limits will improve Outcome: Progressing   Problem: Nutrition: Goal: Adequate nutrition will be maintained Outcome: Progressing   Problem: Pain Managment: Goal: General experience of comfort will improve and/or be controlled Outcome: Progressing   Problem: Fluid Volume: Goal: Compliance with measures to maintain balanced fluid volume will improve Outcome: Progressing

## 2023-10-17 NOTE — Progress Notes (Signed)
 Received patient in bed to unit.  Alert and oriented.  Informed consent signed and in chart.   TX duration:  Patient tolerated well.  Transported back to the room via bed  Alert, without acute distress.  Hand-off given to patient's nurse.  Shella Maxim RN  Access used: left Fistula Access issues: 16 G x 2 used   Total UF removed: 1200 Medication(s) given: Ancef 2 Gram via IV/ port Post HD weight: 83.3 Kg Post HD VS: 109/62, 76 MAP, 16 Resp, 100 % on RA, 87 P, 98.2 Temp oral    Shraddha Lebron S Halli Equihua RN Kidney Dialysis Unit

## 2023-10-17 NOTE — Hospital Course (Signed)
 Jacqueline [name] is a 43 y.o. female with a history of hypertension, hyperlipidemia, diabetes mellitus type 2, CKD stage V, chronic heart failure.  Patient presented secondary to most complaints including generalized weakness, fevers, URI symptoms, nausea and vomiting, loose stools.  Patient found to have evidence of severe hypocalcemia on admission complicated by chronic renal disease which has progressed to ESRD.  Calcium supplemented during admission.  During admission, fever/infectious workup was significant for evidence of Streptococcus bacteremia.  Infectious disease consulted.  Patient was started empirically on ceftriaxone with final cultures significant for Streptococcus nutans infection.  Unclear source for bacteremia.  TTE and TEE were negative for source.  Ceftriaxone transition to cefazolin IV per ID recommendations.

## 2023-10-18 ENCOUNTER — Inpatient Hospital Stay (HOSPITAL_COMMUNITY)

## 2023-10-18 DIAGNOSIS — I132 Hypertensive heart and chronic kidney disease with heart failure and with stage 5 chronic kidney disease, or end stage renal disease: Secondary | ICD-10-CM | POA: Diagnosis not present

## 2023-10-18 DIAGNOSIS — N189 Chronic kidney disease, unspecified: Secondary | ICD-10-CM | POA: Diagnosis not present

## 2023-10-18 DIAGNOSIS — M542 Cervicalgia: Secondary | ICD-10-CM | POA: Diagnosis not present

## 2023-10-18 DIAGNOSIS — B955 Unspecified streptococcus as the cause of diseases classified elsewhere: Secondary | ICD-10-CM | POA: Diagnosis not present

## 2023-10-18 DIAGNOSIS — R7881 Bacteremia: Secondary | ICD-10-CM | POA: Diagnosis not present

## 2023-10-18 DIAGNOSIS — M546 Pain in thoracic spine: Secondary | ICD-10-CM

## 2023-10-18 DIAGNOSIS — N186 End stage renal disease: Secondary | ICD-10-CM | POA: Diagnosis not present

## 2023-10-18 DIAGNOSIS — E1122 Type 2 diabetes mellitus with diabetic chronic kidney disease: Secondary | ICD-10-CM

## 2023-10-18 DIAGNOSIS — I5030 Unspecified diastolic (congestive) heart failure: Secondary | ICD-10-CM | POA: Diagnosis not present

## 2023-10-18 DIAGNOSIS — D631 Anemia in chronic kidney disease: Secondary | ICD-10-CM | POA: Diagnosis not present

## 2023-10-18 DIAGNOSIS — M4184 Other forms of scoliosis, thoracic region: Secondary | ICD-10-CM | POA: Diagnosis not present

## 2023-10-18 DIAGNOSIS — Z992 Dependence on renal dialysis: Secondary | ICD-10-CM | POA: Diagnosis not present

## 2023-10-18 DIAGNOSIS — E872 Acidosis, unspecified: Secondary | ICD-10-CM | POA: Diagnosis not present

## 2023-10-18 DIAGNOSIS — R29890 Loss of height: Secondary | ICD-10-CM | POA: Diagnosis not present

## 2023-10-18 LAB — TYPE AND SCREEN
ABO/RH(D): B POS
Antibody Screen: NEGATIVE
Unit division: 0

## 2023-10-18 LAB — CBC WITH DIFFERENTIAL/PLATELET
Abs Immature Granulocytes: 0.05 10*3/uL (ref 0.00–0.07)
Basophils Absolute: 0.1 10*3/uL (ref 0.0–0.1)
Basophils Relative: 1 %
Eosinophils Absolute: 0.3 10*3/uL (ref 0.0–0.5)
Eosinophils Relative: 4 %
HCT: 23.4 % — ABNORMAL LOW (ref 36.0–46.0)
Hemoglobin: 7.4 g/dL — ABNORMAL LOW (ref 12.0–15.0)
Immature Granulocytes: 1 %
Lymphocytes Relative: 26 %
Lymphs Abs: 2.3 10*3/uL (ref 0.7–4.0)
MCH: 30.1 pg (ref 26.0–34.0)
MCHC: 31.6 g/dL (ref 30.0–36.0)
MCV: 95.1 fL (ref 80.0–100.0)
Monocytes Absolute: 0.8 10*3/uL (ref 0.1–1.0)
Monocytes Relative: 9 %
Neutro Abs: 5.2 10*3/uL (ref 1.7–7.7)
Neutrophils Relative %: 59 %
Platelets: 246 10*3/uL (ref 150–400)
RBC: 2.46 MIL/uL — ABNORMAL LOW (ref 3.87–5.11)
RDW: 13.3 % (ref 11.5–15.5)
WBC: 8.7 10*3/uL (ref 4.0–10.5)
nRBC: 0 % (ref 0.0–0.2)

## 2023-10-18 LAB — RENAL FUNCTION PANEL
Albumin: 2.2 g/dL — ABNORMAL LOW (ref 3.5–5.0)
Anion gap: 10 (ref 5–15)
BUN: 28 mg/dL — ABNORMAL HIGH (ref 6–20)
CO2: 26 mmol/L (ref 22–32)
Calcium: 6.7 mg/dL — ABNORMAL LOW (ref 8.9–10.3)
Chloride: 103 mmol/L (ref 98–111)
Creatinine, Ser: 4.03 mg/dL — ABNORMAL HIGH (ref 0.44–1.00)
GFR, Estimated: 13 mL/min — ABNORMAL LOW (ref >60)
Glucose, Bld: 140 mg/dL — ABNORMAL HIGH (ref 70–99)
Phosphorus: 5 mg/dL — ABNORMAL HIGH (ref 2.5–4.6)
Potassium: 3.9 mmol/L (ref 3.5–5.1)
Sodium: 139 mmol/L (ref 135–145)

## 2023-10-18 LAB — BPAM RBC
Blood Product Expiration Date: 202504132359
Unit Type and Rh: 7300

## 2023-10-18 NOTE — Progress Notes (Signed)
 PROGRESS NOTE   Jacqueline [name]  VHQ:[id number]    DOB: May 13, 1981    DOA: 10/11/2023  PCP: Harvest Forest, MD   I have briefly reviewed patients previous medical records in [facility].  Chief Complaint  Patient presents with   Chest Pain    Brief Hospital Course:  43 year old married female with kids, independent at baseline, medical history significant for hypertension, hyperlipidemia, DM 2, CKD stage V, chronic HFpEF, presented with complaints of generalized weakness, fevers, URI symptoms, nausea, vomiting and loose stools.  She was found to have severe hypocalcemia, CKD has progressed to new ESRD and fever/infectious workup revealed streptococcal bacteremia.  Nephrology was consulted, hemodialysis was initiated this admission.  ID was consulted, TTE and TEE were negative for infectious source, etiology of her bacteremia is unclear, currently on IV cefazolin across dialysis.  Surveillance blood cultures are negative, being monitored.  Checking neck/back MRI.  DC home pending ID clearance.   Assessment & Plan:  Principal Problem:   Hypocalcemia due to chronic kidney disease Active Problems:   Streptococcal bacteremia   Hypocalcemia/secondary hyperparathyroidism Calcium of 5.7 (corrected to 6.7 to account for hypoalbuminemia). Patient given calcium gluconate and managed with peritoneal dialysis. Calcium improved to 6.7 (corrected calcium of 8.1 for albumin of 2.2). Continue management per nephrology -Continue calcitriol 0.5 mcg 3 times daily.  Intact PTH 415.   Streptococcus mutans bacteremia Unclear source. Infectious disease consulted. Ceftriaxone started empirically and transitioned to Cefazolin after culture data became available. Transthoracic Echocardiogram and Transesophageal Echocardiogram without evidence of vegetation. -ID recommendations: continue Cefazolin IV -Surveillance blood cultures x 2 from 3/30: Negative to date. -Orthopantogram without any acute dental  disease and MRI C-spine without acute findings, T spine results pending.   UTI Ruled out with insignificant growth on urine culture. Does not appear to be the source for bacteremia.   CKD stage V progressed to new ESRD Patient started on hemodialysis this admission. Management per nephology. Patient has been set up with outpatient hemodialysis with schedule of Tuesday, Thursday, Saturday. Per nephrology: " Pt has been accepted at [facility] on TTS 12:00 chair time. Pt can start on Tuesday and will need to arrive at 11:00 am for paperwork prior to treatment.".  If cleared for DC today by ID, will need to check with nephrology if she can DC and start HD tomorrow.   Diabetes mellitus type 2 Well controlled. A1C of 4.4%.   Chronic HFpEF Stable/euvolemic. Fluid management with hemodialysis.  Essential hypertension Blood pressures on the softer side.  Hydralazine and amlodipine discontinued.  Remains on metoprolol 25 Mg twice daily, monitor.  Anemia of ESRD Continue Aranesp 100 mcg every week per nephrology, can continue this across dialysis. Hemoglobin stable in the low 7 g range.  Body mass index is 30.43 kg/m.   DVT prophylaxis: Place and maintain sequential compression device Start: 10/14/23 0404 Place TED hose Start: 10/14/23 0404 SCDs Start: 10/11/23 1620     Code Status: Full Code:  Family Communication: None at bedside Disposition:  Status is: Inpatient Remains inpatient appropriate because: Awaiting completion of bacteremia workup by ID and ID clearance for DC home.     Consultants:   Nephrology Infectious disease  Procedures:   Hemodialysis TEE  Antimicrobials:   As noted above   Subjective:  No specific complaints.  She was speaking to her 22-year-old daughter on the phone when I walked in.  Reports feeling tired.  Completed approximately 3 hours of HD yesterday at around 2 PM.  States that she did not feel tired after the initial 2 cycles of HD but somewhat tired  after the last 1.  Objective:   Vitals:   10/17/23 2128 10/18/23 0110 10/18/23 0449 10/18/23 1200  BP: 113/64 104/65 105/66 133/77  Pulse: 86 84 77 84  Resp:  18 18 20   Temp:  98.6 F (37 C) 98 F (36.7 C) 98.4 F (36.9 C)  TempSrc:  Oral Oral Oral  SpO2:  97% 96% 92%  Weight:   80.4 kg   Height:        General exam: Young female, moderately built and nourished lying comfortably propped up in bed without distress.  Appears to be in good spirits. Respiratory system: Clear to auscultation. Respiratory effort normal. Cardiovascular system: S1 & S2 heard, RRR. No JVD, murmurs, rubs, gallops or clicks. No pedal edema.  Telemetry personally reviewed: Sinus rhythm. Gastrointestinal system: Abdomen is nondistended, soft and nontender. No organomegaly or masses felt. Normal bowel sounds heard. Central nervous system: Alert and oriented. No focal neurological deficits. Extremities: Symmetric 5 x 5 power.  Left upper arm AVG dressing clean and dry. Skin: No rashes, lesions or ulcers Psychiatry: Judgement and insight appear normal. Mood & affect appropriate.     Data Reviewed:   I have personally reviewed following labs and imaging studies   CBC: Recent Labs  Lab 10/16/23 0325 10/17/23 0401 10/18/23 0251  WBC 8.2 8.8 8.7  NEUTROABS 4.7 5.4 5.2  HGB 7.3* 7.4* 7.4*  HCT 22.9* 23.7* 23.4*  MCV 94.2 96.3 95.1  PLT 222 260 246    Basic Metabolic Panel: Recent Labs  Lab 10/14/23 0755 10/15/23 0253 10/16/23 0325 10/17/23 0401 10/18/23 0251  NA 138 138 140 141 139  K 4.4 3.9 4.3 4.5 3.9  CL 106 106 107 107 103  CO2 21* 24 25 22 26   GLUCOSE 116* 93 98 151* 140*  BUN 40* 27* 42* 49* 28*  CREATININE 5.25* 4.05* 5.13* 5.97* 4.03*  CALCIUM 6.5* 6.6* 6.5* 6.6* 6.7*  PHOS 3.8 3.6 5.0* 5.8* 5.0*    Liver Function Tests: Recent Labs  Lab 10/14/23 0755 10/15/23 0253 10/16/23 0325 10/17/23 0401 10/18/23 0251  ALBUMIN 2.6* 2.1* 2.1* 2.4* 2.2*    CBG: Recent Labs  Lab  10/12/23 2139  GLUCAP 199*    Microbiology Studies:   Recent Results (from the past 240 hours)  Resp panel by RT-PCR (RSV, Flu A&B, Covid) Anterior Nasal Swab     Status: None   Collection Time: 10/11/23  8:01 AM   Specimen: Anterior Nasal Swab  Result Value Ref Range Status   SARS Coronavirus 2 by RT PCR NEGATIVE NEGATIVE Final    Comment: (NOTE) SARS-CoV-2 target nucleic acids are NOT DETECTED.  The SARS-CoV-2 RNA is generally detectable in upper respiratory specimens during the acute phase of infection. The lowest concentration of SARS-CoV-2 viral copies this assay can detect is 138 copies/mL. A negative result does not preclude SARS-Cov-2 infection and should not be used as the sole basis for treatment or other patient management decisions. A negative result may occur with  improper specimen collection/handling, submission of specimen other than nasopharyngeal swab, presence of viral mutation(s) within the areas targeted by this assay, and inadequate number of viral copies(<138 copies/mL). A negative result must be combined with clinical observations, patient history, and epidemiological information. The expected result is Negative.  Fact Sheet for Patients:  BloggerCourse.com  Fact Sheet for Healthcare Providers:  SeriousBroker.it  This test is no t yet  approved or cleared by the [place] and  has been authorized for detection and/or diagnosis of SARS-CoV-2 by FDA under an Emergency Use Authorization (EUA). This EUA will remain  in effect (meaning this test can be used) for the duration of the COVID-19 declaration under Section 564(b)(1) of the Act, 21 U.S.C.section 360bbb-3(b)(1), unless the authorization is terminated  or revoked sooner.       Influenza A by PCR NEGATIVE NEGATIVE Final   Influenza B by PCR NEGATIVE NEGATIVE Final    Comment: (NOTE) The Xpert Xpress SARS-CoV-2/FLU/RSV plus assay is  intended as an aid in the diagnosis of influenza from Nasopharyngeal swab specimens and should not be used as a sole basis for treatment. Nasal washings and aspirates are unacceptable for Xpert Xpress SARS-CoV-2/FLU/RSV testing.  Fact Sheet for Patients: BloggerCourse.com  Fact Sheet for Healthcare Providers: SeriousBroker.it  This test is not yet approved or cleared by the [place] FDA and has been authorized for detection and/or diagnosis of SARS-CoV-2 by FDA under an Emergency Use Authorization (EUA). This EUA will remain in effect (meaning this test can be used) for the duration of the COVID-19 declaration under Section 564(b)(1) of the Act, 21 U.S.C. section 360bbb-3(b)(1), unless the authorization is terminated or revoked.     Resp Syncytial Virus by PCR NEGATIVE NEGATIVE Final    Comment: (NOTE) Fact Sheet for Patients: BloggerCourse.com  Fact Sheet for Healthcare Providers: SeriousBroker.it  This test is not yet approved or cleared by the [place] FDA and has been authorized for detection and/or diagnosis of SARS-CoV-2 by FDA under an Emergency Use Authorization (EUA). This EUA will remain in effect (meaning this test can be used) for the duration of the COVID-19 declaration under Section 564(b)(1) of the Act, 21 U.S.C. section 360bbb-3(b)(1), unless the authorization is terminated or revoked.  Performed at [facility], [address] [place], [place] [address]   Culture, [place] Urine     Status: Abnormal   Collection Time: 10/13/23  5:19 PM   Specimen: Urine, Random  Result Value Ref Range Status   Specimen Description URINE, RANDOM  Final   Special Requests NONE  Final   Culture (A)  Final    <10,000 COLONIES/mL INSIGNIFICANT GROWTH NO GROUP B STREP (S.AGALACTIAE) ISOLATED Performed at [facility], [address], [place], [place]  [address]    Report Status 10/15/2023 FINAL  Final  Culture, blood (Routine X 2) w Reflex to ID Panel     Status: Abnormal   Collection Time: 10/13/23  6:53 PM   Specimen: BLOOD  Result Value Ref Range Status   Specimen Description BLOOD SITE NOT SPECIFIED  Final   Special Requests   Final    BOTTLES DRAWN AEROBIC AND ANAEROBIC Blood Culture results may not be optimal due to an inadequate volume of blood received in culture bottles   Culture  Setup Time   Final    GRAM POSITIVE COCCI IN CHAINS IN BOTH AEROBIC AND ANAEROBIC BOTTLES CRITICAL RESULT CALLED TO, READ BACK BY AND VERIFIED WITH: PHARMD CAREN AMEND [id number] BY J RAZZAK,MT Performed at [facility], [address], [place], [place] [address]    Culture STREPTOCOCCUS MUTANS (A)  Final   Report Status 10/16/2023 FINAL  Final   Organism ID, Bacteria STREPTOCOCCUS MUTANS  Final      Susceptibility   Streptococcus mutans - MIC*    PENICILLIN <=0.06 SENSITIVE Sensitive     CEFTRIAXONE <=0.12 SENSITIVE Sensitive  ERYTHROMYCIN <=0.12 SENSITIVE Sensitive     LEVOFLOXACIN 2 SENSITIVE Sensitive     VANCOMYCIN 1 SENSITIVE Sensitive     * STREPTOCOCCUS MUTANS  Blood Culture ID Panel (Reflexed)     Status: Abnormal   Collection Time: 10/13/23  6:53 PM  Result Value Ref Range Status   Enterococcus faecalis NOT DETECTED NOT DETECTED Final   Enterococcus Faecium NOT DETECTED NOT DETECTED Final   Listeria monocytogenes NOT DETECTED NOT DETECTED Final   Staphylococcus species NOT DETECTED NOT DETECTED Final   Staphylococcus aureus (BCID) NOT DETECTED NOT DETECTED Final   Staphylococcus epidermidis NOT DETECTED NOT DETECTED Final   Staphylococcus lugdunensis NOT DETECTED NOT DETECTED Final   Streptococcus species DETECTED (A) NOT DETECTED Final    Comment: Not Enterococcus species, Streptococcus agalactiae, Streptococcus pyogenes, or Streptococcus pneumoniae. CRITICAL RESULT CALLED TO, READ BACK BY AND VERIFIED WITH: PHARMD  CAREN AMEND [id number] BY J RAZZAK, MT    Streptococcus agalactiae NOT DETECTED NOT DETECTED Final   Streptococcus pneumoniae NOT DETECTED NOT DETECTED Final   Streptococcus pyogenes NOT DETECTED NOT DETECTED Final   A.calcoaceticus-baumannii NOT DETECTED NOT DETECTED Final   Bacteroides fragilis NOT DETECTED NOT DETECTED Final   Enterobacterales NOT DETECTED NOT DETECTED Final   Enterobacter cloacae complex NOT DETECTED NOT DETECTED Final   Escherichia coli NOT DETECTED NOT DETECTED Final   Klebsiella aerogenes NOT DETECTED NOT DETECTED Final   Klebsiella oxytoca NOT DETECTED NOT DETECTED Final   Klebsiella pneumoniae NOT DETECTED NOT DETECTED Final   Proteus species NOT DETECTED NOT DETECTED Final   Salmonella species NOT DETECTED NOT DETECTED Final   Serratia marcescens NOT DETECTED NOT DETECTED Final   Haemophilus influenzae NOT DETECTED NOT DETECTED Final   Neisseria meningitidis NOT DETECTED NOT DETECTED Final   Pseudomonas aeruginosa NOT DETECTED NOT DETECTED Final   Stenotrophomonas maltophilia NOT DETECTED NOT DETECTED Final   Candida albicans NOT DETECTED NOT DETECTED Final   Candida auris NOT DETECTED NOT DETECTED Final   Candida glabrata NOT DETECTED NOT DETECTED Final   Candida krusei NOT DETECTED NOT DETECTED Final   Candida parapsilosis NOT DETECTED NOT DETECTED Final   Candida tropicalis NOT DETECTED NOT DETECTED Final   Cryptococcus neoformans/gattii NOT DETECTED NOT DETECTED Final    Comment: Performed at [facility], [address] [place], [place] [address]  Culture, blood (Routine X 2) w Reflex to ID Panel     Status: Abnormal   Collection Time: 10/13/23  7:02 PM   Specimen: BLOOD  Result Value Ref Range Status   Specimen Description BLOOD SITE NOT SPECIFIED  Final   Special Requests   Final    BOTTLES DRAWN AEROBIC ONLY Blood Culture results may not be optimal due to an inadequate volume of blood received in culture bottles   Culture  Setup Time    Final    GRAM POSITIVE COCCI IN CHAINS AEROBIC BOTTLE ONLY CRITICAL VALUE NOTED.  VALUE IS CONSISTENT WITH PREVIOUSLY REPORTED AND CALLED VALUE.    Culture (A)  Final    STREPTOCOCCUS MUTANS SUSCEPTIBILITIES PERFORMED ON PREVIOUS CULTURE WITHIN THE LAST 5 DAYS. Performed at [facility], [address] [place], [place] [address]    Report Status 10/16/2023 FINAL  Final  Culture, blood (Routine X 2) w Reflex to ID Panel     Status: None (Preliminary result)   Collection Time: 10/15/23  2:21 PM   Specimen: BLOOD RIGHT ARM  Result Value Ref Range Status   Specimen Description  BLOOD RIGHT ARM  Final   Special Requests   Final    BOTTLES DRAWN AEROBIC AND ANAEROBIC Blood Culture results may not be optimal due to an inadequate volume of blood received in culture bottles   Culture   Final    NO GROWTH 3 DAYS Performed at [facility], [address], [place], [place] [address]    Report Status PENDING  Incomplete  Culture, blood (Routine X 2) w Reflex to ID Panel     Status: None (Preliminary result)   Collection Time: 10/15/23  2:21 PM   Specimen: BLOOD RIGHT HAND  Result Value Ref Range Status   Specimen Description BLOOD RIGHT HAND  Final   Special Requests   Final    BOTTLES DRAWN AEROBIC AND ANAEROBIC Blood Culture adequate volume   Culture   Final    NO GROWTH 3 DAYS Performed at [facility], [address], [place], [place] [address]    Report Status PENDING  Incomplete    Radiology Studies:  DG Orthopantogram Result Date: 10/17/2023 CLINICAL DATA:  Struck to coccal bacteremia EXAM: ORTHOPANTOGRAM/PANORAMIC COMPARISON:  None Available. FINDINGS: Panorex view of the mandible was performed. There are no acute bony abnormalities. No evidence of dental erosions or caries. Multiple prior teeth extractions. Visualized paranasal sinuses are clear. IMPRESSION: 1. Multiple prior teeth extractions. No evidence of acute dental disease. Electronically Signed   By:  Sharlet Salina M.D.   On: 10/17/2023 20:33   ECHO TEE Result Date: 10/16/2023    TRANSESOPHOGEAL ECHO REPORT   Patient Name:   Jacqueline [name] Date of Exam: 10/16/2023 Medical Rec #:  [id number]    Height:       64.0 in Accession #:    [id number]   Weight:       176.6 lb Date of Birth:  [birth year]    BSA:          1.856 m Patient Age:    42 years     BP:           109/61 mmHg Patient Gender: F            HR:           80 bpm. Exam Location:  Inpatient Procedure: 3D Echo, Transesophageal Echo, Saline Contrast Bubble Study, Cardiac            Doppler and Color Doppler (Both Spectral and Color Flow Doppler were            utilized during procedure). Indications:     Endocarditis  History:         Patient has prior history of Echocardiogram examinations, most                  recent 10/12/2023. CHF; Risk Factors:Hypertension and Diabetes.  Sonographer:     Rosaland Lao Sonographer#2:   Irving Burton Senior Referring Phys:  [id number] Corrin Parker Diagnosing Phys: Lennie Odor MD PROCEDURE: After discussion of the risks and benefits of a TEE, an informed consent was obtained from the patient. TEE procedure time was 12 minutes. The transesophogeal probe was passed without difficulty through the esophogus of the patient. Imaged were obtained with the patient in a left lateral decubitus position. Sedation performed by different physician. The patient was monitored while under deep sedation. Anesthestetic sedation was provided intravenously by Anesthesiology: 170mg  of Propofol, 80mg  of Lidocaine. Image quality was excellent. The patient's vital signs; including heart rate, blood pressure, and oxygen saturation; remained  stable throughout the procedure. The patient developed no complications during the procedure.  IMPRESSIONS  1. Left ventricular ejection fraction, by estimation, is 65 to 70%. Left ventricular ejection fraction by 3D volume is 67 %. The left ventricle has normal function. The left ventricle has no regional wall  motion abnormalities.  2. Right ventricular systolic function is normal. The right ventricular size is normal.  3. No left atrial/left atrial appendage thrombus was detected.  4. The mitral valve is grossly normal. Trivial mitral valve regurgitation. No evidence of mitral stenosis.  5. The aortic valve is tricuspid. Aortic valve regurgitation is not visualized. No aortic stenosis is present.  6. Agitated saline contrast bubble study was negative, with no evidence of any interatrial shunt.  7. 3D performed for the EF and demonstrates LVEF by 3D 67%. Conclusion(s)/Recommendation(s): No evidence of vegetation/infective endocarditis on this transesophageael echocardiogram. FINDINGS  Left Ventricle: Left ventricular ejection fraction, by estimation, is 65 to 70%. Left ventricular ejection fraction by 3D volume is 67 %. The left ventricle has normal function. The left ventricle has no regional wall motion abnormalities. The left ventricular internal cavity size was normal in size. Right Ventricle: The right ventricular size is normal. No increase in right ventricular wall thickness. Right ventricular systolic function is normal. Left Atrium: Left atrial size was normal in size. No left atrial/left atrial appendage thrombus was detected. Right Atrium: Right atrial size was normal in size. Pericardium: Trivial pericardial effusion is present. Mitral Valve: The mitral valve is grossly normal. Trivial mitral valve regurgitation. No evidence of mitral valve stenosis. There is no evidence of mitral valve vegetation. Tricuspid Valve: The tricuspid valve is grossly normal. Tricuspid valve regurgitation is trivial. No evidence of tricuspid stenosis. There is no evidence of tricuspid valve vegetation. Aortic Valve: The aortic valve is tricuspid. Aortic valve regurgitation is not visualized. No aortic stenosis is present. There is no evidence of aortic valve vegetation. Pulmonic Valve: The pulmonic valve was grossly normal. Pulmonic  valve regurgitation is not visualized. No evidence of pulmonic stenosis. There is no evidence of pulmonic valve vegetation. Aorta: The aortic root and ascending aorta are structurally normal, with no evidence of dilitation. Venous: The left lower pulmonary vein, left upper pulmonary vein, right lower pulmonary vein and right upper pulmonary vein are normal. IAS/Shunts: The atrial septum is grossly normal. Agitated saline contrast was given intravenously to evaluate for intracardiac shunting. Agitated saline contrast bubble study was negative, with no evidence of any interatrial shunt. Additional Comments: Spectral Doppler performed.  3D Volume EF LV 3D EF:    Left ventricular ejection fraction by 3D volume is 67              %.  3D Volume EF LV 3D EF:    67.20 % LV 3D EDV:   104800.00 mm LV 3D ESV:   34300.00 mm LV 3D SV:    70500.00 mm  3D Volume EF: 3D EF:        67 %  AORTA Ao Root diam: 2.98 cm Ao Asc diam:  3.00 cm Lennie Odor MD Electronically signed by Lennie Odor MD Signature Date/Time: 10/16/2023/2:05:15 PM    Final    EP STUDY Result Date: 10/16/2023 See surgical note for result.   Scheduled Meds:    sodium chloride   Intravenous Once   calcitRIOL  0.5 mcg Oral TID   Chlorhexidine Gluconate Cloth  6 each Topical Q0600   darbepoetin (ARANESP) injection - NON-DIALYSIS  100 mcg Subcutaneous Q Fri-1800  gabapentin  300 mg Oral QHS   metoprolol tartrate  25 mg Oral BID    Continuous Infusions:     ceFAZolin (ANCEF) IV 200 mL/hr at 10/17/23 1551   promethazine (PHENERGAN) injection (IM or IVPB) 150 mL/hr at 10/16/23 0340     LOS: 7 days     Marcellus Scott, MD,  FACP, [facility], [facility], [facility]   [facility] [place]      To contact the attending provider between 7A-7P or the covering provider during after hours 7P-7A, please log into the web site [email or web address] and access using universal [place] password for that web site. If you do not  have the password, please call the hospital operator.  10/18/2023, 12:19 PM

## 2023-10-18 NOTE — Progress Notes (Signed)
 [facility] NEPHROLOGY PROGRESS NOTE  Assessment/ Plan: Pt is a 43 y.o. yo female  with medical hx of HTN, HLD, DMII, CKD V, HFpEF, CKD s/p LUE AVG as OP (Dr. Ronalee Belts at [facility]), with new ESRD.    # Acute febrile illness due to UTI and bacteremia:  Blood culture with streptococcal mutans.  Currently on cefazolin.  Primary team has consulted ID - repeat cx NGTD.  TEE neg. Dental scan fine.  MRI neck/back pending.    # CKD stage V with progression to new ESRD - [facility] HD on 3/26 via her AVG  -Continue dialysis TTS schedule - Pt has been accepted at [facility] on TTS 12:00 chair time. Pt can start on Tuesday and will need to arrive at 11:00 am for paperwork prior to treatment.    # Hypertension: BP remains too low after d/c hydralazine and decrease amlodipine so will d/c amlodipine   # Heart failure with preserved EF  -euvolemic on exam   # Anemia of CKD - Continue  aranesp 100 mcg every week, monitor lab.  # Secondary hyperparathyroidism, Hypocalcemia: Treated with IV calcium.  Intact PTH 415. Calcitriol 0.5 mcg TID.  OK for d/c from nephrology perspective.   Subjective: Seen and examined.  Feeling better.  Denies nausea, vomiting, chest pain, shortness of breathing.  Tolerating HD fine.  Objective Vital signs in last 24 hours: Vitals:   10/17/23 2017 10/17/23 2128 10/18/23 0110 10/18/23 0449  BP: (!) 103/57 113/64 104/65 105/66  Pulse: 88 86 84 77  Resp: 18  18 18   Temp: 98.9 F (37.2 C)  98.6 F (37 C) 98 F (36.7 C)  TempSrc: Oral  Oral Oral  SpO2: 100%  97% 96%  Weight:    80.4 kg  Height:       Weight change: 2.853 kg  Intake/Output Summary (Last 24 hours) at 10/18/2023 1005 Last data filed at 10/18/2023 0848 Gross per 24 hour  Intake 591 ml  Output 1200 ml  Net -609 ml       Labs: RENAL PANEL Recent Labs  Lab 10/14/23 0755 10/15/23 0253 10/16/23 0325 10/17/23 0401 10/18/23 0251  NA 138 138 140 141 139  K 4.4 3.9 4.3 4.5 3.9  CL 106 106 107  107 103  CO2 21* 24 25 22 26   GLUCOSE 116* 93 98 151* 140*  BUN 40* 27* 42* 49* 28*  CREATININE 5.25* 4.05* 5.13* 5.97* 4.03*  CALCIUM 6.5* 6.6* 6.5* 6.6* 6.7*  PHOS 3.8 3.6 5.0* 5.8* 5.0*  ALBUMIN 2.6* 2.1* 2.1* 2.4* 2.2*    Liver Function Tests: Recent Labs  Lab 10/16/23 0325 10/17/23 0401 10/18/23 0251  ALBUMIN 2.1* 2.4* 2.2*   No results for input(s): "LIPASE", "AMYLASE" in the last 168 hours. No results for input(s): "AMMONIA" in the last 168 hours. CBC: Recent Labs    07/21/23 0925 10/11/23 0920 10/11/23 1930 10/12/23 0319 10/14/23 0755 10/15/23 1009 10/16/23 0325 10/17/23 0401 10/18/23 0251  HGB  --    < >  --    < > 8.4* 7.9* 7.3* 7.4* 7.4*  MCV  --    < >  --    < > 94.6 94.6 94.2 96.3 95.1  FERRITIN 323*  --  187  --   --   --   --   --   --   TIBC 210*  --  176*  --   --   --   --   --   --  IRON 72  --  41  --   --   --   --   --   --    < > = values in this interval not displayed.    Cardiac Enzymes: No results for input(s): "CKTOTAL", "CKMB", "CKMBINDEX", "TROPONINI" in the last 168 hours. CBG: Recent Labs  Lab 10/12/23 2139  GLUCAP 199*    Iron Studies:  No results for input(s): "IRON", "TIBC", "TRANSFERRIN", "FERRITIN" in the last 72 hours.  Studies/Results: DG Orthopantogram Result Date: 10/17/2023 CLINICAL DATA:  Struck to coccal bacteremia EXAM: ORTHOPANTOGRAM/PANORAMIC COMPARISON:  None Available. FINDINGS: Panorex view of the mandible was performed. There are no acute bony abnormalities. No evidence of dental erosions or caries. Multiple prior teeth extractions. Visualized paranasal sinuses are clear. IMPRESSION: 1. Multiple prior teeth extractions. No evidence of acute dental disease. Electronically Signed   By: Sharlet Salina M.D.   On: 10/17/2023 20:33   ECHO TEE Result Date: 10/16/2023    TRANSESOPHOGEAL ECHO REPORT   Patient Name:   Jacqueline [name] Date of Exam: 10/16/2023 Medical Rec #:  [id number]    Height:       64.0 in Accession #:     [id number]   Weight:       176.6 lb Date of Birth:  04-26-1981    BSA:          1.856 m Patient Age:    42 years     BP:           109/61 mmHg Patient Gender: F            HR:           80 bpm. Exam Location:  Inpatient Procedure: 3D Echo, Transesophageal Echo, Saline Contrast Bubble Study, Cardiac            Doppler and Color Doppler (Both Spectral and Color Flow Doppler were            utilized during procedure). Indications:     Endocarditis  History:         Patient has prior history of Echocardiogram examinations, most                  recent 10/12/2023. CHF; Risk Factors:Hypertension and Diabetes.  Sonographer:     Rosaland Lao Sonographer#2:   Irving Burton Senior Referring Phys:  [id number] Corrin Parker Diagnosing Phys: Lennie Odor MD PROCEDURE: After discussion of the risks and benefits of a TEE, an informed consent was obtained from the patient. TEE procedure time was 12 minutes. The transesophogeal probe was passed without difficulty through the esophogus of the patient. Imaged were obtained with the patient in a left lateral decubitus position. Sedation performed by different physician. The patient was monitored while under deep sedation. Anesthestetic sedation was provided intravenously by Anesthesiology: 170mg  of Propofol, 80mg  of Lidocaine. Image quality was excellent. The patient's vital signs; including heart rate, blood pressure, and oxygen saturation; remained stable throughout the procedure. The patient developed no complications during the procedure.  IMPRESSIONS  1. Left ventricular ejection fraction, by estimation, is 65 to 70%. Left ventricular ejection fraction by 3D volume is 67 %. The left ventricle has normal function. The left ventricle has no regional wall motion abnormalities.  2. Right ventricular systolic function is normal. The right ventricular size is normal.  3. No left atrial/left atrial appendage thrombus was detected.  4. The mitral valve is grossly normal. Trivial mitral valve  regurgitation. No evidence of mitral stenosis.  5.  The aortic valve is tricuspid. Aortic valve regurgitation is not visualized. No aortic stenosis is present.  6. Agitated saline contrast bubble study was negative, with no evidence of any interatrial shunt.  7. 3D performed for the EF and demonstrates LVEF by 3D 67%. Conclusion(s)/Recommendation(s): No evidence of vegetation/infective endocarditis on this transesophageael echocardiogram. FINDINGS  Left Ventricle: Left ventricular ejection fraction, by estimation, is 65 to 70%. Left ventricular ejection fraction by 3D volume is 67 %. The left ventricle has normal function. The left ventricle has no regional wall motion abnormalities. The left ventricular internal cavity size was normal in size. Right Ventricle: The right ventricular size is normal. No increase in right ventricular wall thickness. Right ventricular systolic function is normal. Left Atrium: Left atrial size was normal in size. No left atrial/left atrial appendage thrombus was detected. Right Atrium: Right atrial size was normal in size. Pericardium: Trivial pericardial effusion is present. Mitral Valve: The mitral valve is grossly normal. Trivial mitral valve regurgitation. No evidence of mitral valve stenosis. There is no evidence of mitral valve vegetation. Tricuspid Valve: The tricuspid valve is grossly normal. Tricuspid valve regurgitation is trivial. No evidence of tricuspid stenosis. There is no evidence of tricuspid valve vegetation. Aortic Valve: The aortic valve is tricuspid. Aortic valve regurgitation is not visualized. No aortic stenosis is present. There is no evidence of aortic valve vegetation. Pulmonic Valve: The pulmonic valve was grossly normal. Pulmonic valve regurgitation is not visualized. No evidence of pulmonic stenosis. There is no evidence of pulmonic valve vegetation. Aorta: The aortic root and ascending aorta are structurally normal, with no evidence of dilitation. Venous: The  left lower pulmonary vein, left upper pulmonary vein, right lower pulmonary vein and right upper pulmonary vein are normal. IAS/Shunts: The atrial septum is grossly normal. Agitated saline contrast was given intravenously to evaluate for intracardiac shunting. Agitated saline contrast bubble study was negative, with no evidence of any interatrial shunt. Additional Comments: Spectral Doppler performed.  3D Volume EF LV 3D EF:    Left ventricular ejection fraction by 3D volume is 67              %.  3D Volume EF LV 3D EF:    67.20 % LV 3D EDV:   104800.00 mm LV 3D ESV:   34300.00 mm LV 3D SV:    70500.00 mm  3D Volume EF: 3D EF:        67 %  AORTA Ao Root diam: 2.98 cm Ao Asc diam:  3.00 cm Lennie Odor MD Electronically signed by Lennie Odor MD Signature Date/Time: 10/16/2023/2:05:15 PM    Final    EP STUDY Result Date: 10/16/2023 See surgical note for result.    Medications: Infusions:   ceFAZolin (ANCEF) IV 200 mL/hr at 10/17/23 1551   promethazine (PHENERGAN) injection (IM or IVPB) 150 mL/hr at 10/16/23 0340    Scheduled Medications:  sodium chloride   Intravenous Once   amLODipine  5 mg Oral Daily   calcitRIOL  0.5 mcg Oral TID   Chlorhexidine Gluconate Cloth  6 each Topical Q0600   darbepoetin (ARANESP) injection - NON-DIALYSIS  100 mcg Subcutaneous Q Fri-1800   gabapentin  300 mg Oral QHS   metoprolol tartrate  25 mg Oral BID    have reviewed scheduled and prn medications.  Physical Exam: General:NAD, comfortable Heart:RRR, s1s2 nl Lungs:clear b/l, no crackle Abdomen:soft, Non-tender, non-distended Extremities:No edema Dialysis Access: AV graft with good thrill and bruit. No erythema, TTP, fluctuance.   Bertram Denver  Glenna Fellows 10/18/2023,10:05 AM  LOS: 7 days

## 2023-10-18 NOTE — Progress Notes (Addendum)
 [facility] Infectious Diseases Follow Up Note  Patient Identification: Patient Name: Jacqueline [name] MRN: [id number] Admit Date: 10/11/2023  7:51 AM Age: 43 y.o.Today's Date: 10/18/2023  Reason for Visit: Bacteremia  Principal Problem:   Hypocalcemia due to chronic kidney disease Active Problems:   Streptococcal bacteremia  Antibiotics: Ceftriaxone 3/28-3/31 Cefazolin 4/1-  Lines/Hardwares: LUE AV graft  Interval Events: Remains afebrile Labs remarkable for no leukocytosis, hemoglobin at 7.4 Orthopantogram negative for acute dental disease   Assessment 43 year old female with prior history of HTN, HLD, DM 2, CKD stage V started on IHD this admission via left upper extremity AV graft, HFpEF admitted with  # Strep mutans bacteremia -3/30 blood cx NG in 3 days  -3/31 TEE negative for endocarditis   # Posterior neck and upper thoracic pain  -MRI T and L spine - no signs of infection  # DM2  - A1c 4.4   Recommendations -continue cefazolin, plan for 2 weeks from negative blood cultures 3/30. EOT 10/28/23 -Monitor CBC and BMP on antibiotics -[facility] at [facility] on 4/10 at 11: 15 am -ID will so, recall back with questions or concerns.  Rest of the management as per the primary team. Thank you for the consult. Please page with pertinent questions or concerns.  ______________________________________________________________________ Subjective patient seen and examined at the bedside. Still has upper back and upper back pain but no lower back pain.   Past Medical History:  Diagnosis Date   Anemia    Anxiety    Chronic bronchitis (HCC)    Chronic kidney disease    Hypertension    Increased frequency of headaches    Morbid obesity (HCC)    Necrotizing fasciitis (HCC)    Sleep apnea    does not use CPAP - - gastric bypass   Type II diabetes mellitus (HCC)    no meds, diet controlled   Past Surgical History:  Procedure  Laterality Date   AV FISTULA PLACEMENT Left 05/02/2023   Procedure: LEFT ARM ARTERIOVENOUS (AV) FISTULA  GRAFT;  Surgeon: Daria Pastures, MD;  Location: [facility] OR;  Service: Vascular;  Laterality: Left;   BARIATRIC SURGERY     BREAST REDUCTION SURGERY  03/21/2017   CARDIAC CATHETERIZATION  01/06/2016   CARDIAC CATHETERIZATION N/A 01/06/2016   Procedure: Left Heart Cath and Coronary Angiography;  Surgeon: Iran Ouch, MD;  Location: [facility] INVASIVE CV LAB;  Service: Cardiovascular;  Laterality: N/A;   CESAREAN SECTION  08/2014   I & D EXTREMITY Right 06/18/2022   Procedure: IRRIGATION AND DEBRIDEMENT RIGHT ABDOMEN AND THIGH;  Surgeon: Kinsinger, De Blanch, MD;  Location: [facility] OR;  Service: General;  Laterality: Right;   I & D EXTREMITY Right 06/20/2022   Procedure: WOUND EXPLORATION OF RIGHT THIGH AND RIGHT GROIN WITH IRRIGATION AND DEBRIDEMENT;  Surgeon: Griselda Miner, MD;  Location: [facility] OR;  Service: General;  Laterality: Right;   REDUCTION MAMMAPLASTY Bilateral 03/21/2017   TRANSESOPHAGEAL ECHOCARDIOGRAM (CATH LAB) N/A 10/16/2023   Procedure: TRANSESOPHAGEAL ECHOCARDIOGRAM;  Surgeon: Sande Rives, MD;  Location: [facility] INVASIVE CV LAB;  Service: Cardiovascular;  Laterality: N/A;   Vitals BP 105/66 (BP Location: Right Arm)   Pulse 77   Temp 98 F (36.7 C) (Oral)   Resp 18   Ht 5\' 4"  (1.626 m)   Wt 80.4 kg   LMP 10/09/2023 (Exact Date)   SpO2 96%   BMI 30.43 kg/m     Physical Exam Constitutional: Adult female, lying in the bed, nontoxic-appearing    Comments:  HEENT WNL  Cardiovascular:     Rate and Rhythm: Normal rate and regular rhythm.     Heart sounds:   Pulmonary:     Effort: Pulmonary effort is normal.     Comments:   Abdominal:     Palpations: Abdomen is soft.     Tenderness: Nondistended and nontender  Musculoskeletal:        General: No swelling or tenderness in peripheral joints.  Left upper extremity AV graft with no signs of infection  Skin:    Comments:  No rashes  Neurological:     General: Awake, alert and oriented, grossly nonfocal.  Some tenderness in the posterior C-spine as well as upper thoracic region but no tenderness in the lower thoracic and lumbar region   Pertinent Microbiology Results for orders placed or performed during the hospital encounter of 10/11/23  Resp panel by RT-PCR (RSV, Flu A&B, Covid) Anterior Nasal Swab     Status: None   Collection Time: 10/11/23  8:01 AM   Specimen: Anterior Nasal Swab  Result Value Ref Range Status   SARS Coronavirus 2 by RT PCR NEGATIVE NEGATIVE Final    Comment: (NOTE) SARS-CoV-2 target nucleic acids are NOT DETECTED.  The SARS-CoV-2 RNA is generally detectable in upper respiratory specimens during the acute phase of infection. The lowest concentration of SARS-CoV-2 viral copies this assay can detect is 138 copies/mL. A negative result does not preclude SARS-Cov-2 infection and should not be used as the sole basis for treatment or other patient management decisions. A negative result may occur with  improper specimen collection/handling, submission of specimen other than nasopharyngeal swab, presence of viral mutation(s) within the areas targeted by this assay, and inadequate number of viral copies(<138 copies/mL). A negative result must be combined with clinical observations, patient history, and epidemiological information. The expected result is Negative.  Fact Sheet for Patients:  BloggerCourse.com  Fact Sheet for Healthcare Providers:  SeriousBroker.it  This test is no t yet approved or cleared by the [place] FDA and  has been authorized for detection and/or diagnosis of SARS-CoV-2 by FDA under an Emergency Use Authorization (EUA). This EUA will remain  in effect (meaning this test can be used) for the duration of the COVID-19 declaration under Section 564(b)(1) of the Act, 21 U.S.C.section 360bbb-3(b)(1), unless the  authorization is terminated  or revoked sooner.       Influenza A by PCR NEGATIVE NEGATIVE Final   Influenza B by PCR NEGATIVE NEGATIVE Final    Comment: (NOTE) The Xpert Xpress SARS-CoV-2/FLU/RSV plus assay is intended as an aid in the diagnosis of influenza from Nasopharyngeal swab specimens and should not be used as a sole basis for treatment. Nasal washings and aspirates are unacceptable for Xpert Xpress SARS-CoV-2/FLU/RSV testing.  Fact Sheet for Patients: BloggerCourse.com  Fact Sheet for Healthcare Providers: SeriousBroker.it  This test is not yet approved or cleared by the [place] FDA and has been authorized for detection and/or diagnosis of SARS-CoV-2 by FDA under an Emergency Use Authorization (EUA). This EUA will remain in effect (meaning this test can be used) for the duration of the COVID-19 declaration under Section 564(b)(1) of the Act, 21 U.S.C. section 360bbb-3(b)(1), unless the authorization is terminated or revoked.     Resp Syncytial Virus by PCR NEGATIVE NEGATIVE Final    Comment: (NOTE) Fact Sheet for Patients: BloggerCourse.com  Fact Sheet for Healthcare Providers: SeriousBroker.it  This test is not yet approved or cleared by the [place] FDA and has  been authorized for detection and/or diagnosis of SARS-CoV-2 by FDA under an Emergency Use Authorization (EUA). This EUA will remain in effect (meaning this test can be used) for the duration of the COVID-19 declaration under Section 564(b)(1) of the Act, 21 U.S.C. section 360bbb-3(b)(1), unless the authorization is terminated or revoked.  Performed at [facility], [address] [place], [place] [address]   Culture, [place] Urine     Status: Abnormal   Collection Time: 10/13/23  5:19 PM   Specimen: Urine, Random  Result Value Ref Range Status   Specimen Description URINE, RANDOM   Final   Special Requests NONE  Final   Culture (A)  Final    <10,000 COLONIES/mL INSIGNIFICANT GROWTH NO GROUP B STREP (S.AGALACTIAE) ISOLATED Performed at [facility], [address] [place], [place] [address]    Report Status 10/15/2023 FINAL  Final  Culture, blood (Routine X 2) w Reflex to ID Panel     Status: Abnormal   Collection Time: 10/13/23  6:53 PM   Specimen: BLOOD  Result Value Ref Range Status   Specimen Description BLOOD SITE NOT SPECIFIED  Final   Special Requests   Final    BOTTLES DRAWN AEROBIC AND ANAEROBIC Blood Culture results may not be optimal due to an inadequate volume of blood received in culture bottles   Culture  Setup Time   Final    GRAM POSITIVE COCCI IN CHAINS IN BOTH AEROBIC AND ANAEROBIC BOTTLES CRITICAL RESULT CALLED TO, READ BACK BY AND VERIFIED WITH: PHARMD CAREN AMEND [id number] BY J RAZZAK,MT Performed at [facility], [address] [place], [place] [address]    Culture STREPTOCOCCUS MUTANS (A)  Final   Report Status 10/16/2023 FINAL  Final   Organism ID, Bacteria STREPTOCOCCUS MUTANS  Final      Susceptibility   Streptococcus mutans - MIC*    PENICILLIN <=0.06 SENSITIVE Sensitive     CEFTRIAXONE <=0.12 SENSITIVE Sensitive     ERYTHROMYCIN <=0.12 SENSITIVE Sensitive     LEVOFLOXACIN 2 SENSITIVE Sensitive     VANCOMYCIN 1 SENSITIVE Sensitive     * STREPTOCOCCUS MUTANS  Blood Culture ID Panel (Reflexed)     Status: Abnormal   Collection Time: 10/13/23  6:53 PM  Result Value Ref Range Status   Enterococcus faecalis NOT DETECTED NOT DETECTED Final   Enterococcus Faecium NOT DETECTED NOT DETECTED Final   Listeria monocytogenes NOT DETECTED NOT DETECTED Final   Staphylococcus species NOT DETECTED NOT DETECTED Final   Staphylococcus aureus (BCID) NOT DETECTED NOT DETECTED Final   Staphylococcus epidermidis NOT DETECTED NOT DETECTED Final   Staphylococcus lugdunensis NOT DETECTED NOT DETECTED Final   Streptococcus species  DETECTED (A) NOT DETECTED Final    Comment: Not Enterococcus species, Streptococcus agalactiae, Streptococcus pyogenes, or Streptococcus pneumoniae. CRITICAL RESULT CALLED TO, READ BACK BY AND VERIFIED WITH: PHARMD CAREN AMEND [id number] BY J RAZZAK, MT    Streptococcus agalactiae NOT DETECTED NOT DETECTED Final   Streptococcus pneumoniae NOT DETECTED NOT DETECTED Final   Streptococcus pyogenes NOT DETECTED NOT DETECTED Final   A.calcoaceticus-baumannii NOT DETECTED NOT DETECTED Final   Bacteroides fragilis NOT DETECTED NOT DETECTED Final   Enterobacterales NOT DETECTED NOT DETECTED Final   Enterobacter cloacae complex NOT DETECTED NOT DETECTED Final   Escherichia coli NOT DETECTED NOT DETECTED Final   Klebsiella aerogenes NOT DETECTED NOT DETECTED Final   Klebsiella oxytoca NOT DETECTED NOT DETECTED Final   Klebsiella pneumoniae NOT DETECTED NOT  DETECTED Final   Proteus species NOT DETECTED NOT DETECTED Final   Salmonella species NOT DETECTED NOT DETECTED Final   Serratia marcescens NOT DETECTED NOT DETECTED Final   Haemophilus influenzae NOT DETECTED NOT DETECTED Final   Neisseria meningitidis NOT DETECTED NOT DETECTED Final   Pseudomonas aeruginosa NOT DETECTED NOT DETECTED Final   Stenotrophomonas maltophilia NOT DETECTED NOT DETECTED Final   Candida albicans NOT DETECTED NOT DETECTED Final   Candida auris NOT DETECTED NOT DETECTED Final   Candida glabrata NOT DETECTED NOT DETECTED Final   Candida krusei NOT DETECTED NOT DETECTED Final   Candida parapsilosis NOT DETECTED NOT DETECTED Final   Candida tropicalis NOT DETECTED NOT DETECTED Final   Cryptococcus neoformans/gattii NOT DETECTED NOT DETECTED Final    Comment: Performed at [facility], [address] [place], [place] [address]  Culture, blood (Routine X 2) w Reflex to ID Panel     Status: Abnormal   Collection Time: 10/13/23  7:02 PM   Specimen: BLOOD  Result Value Ref Range Status   Specimen Description BLOOD  SITE NOT SPECIFIED  Final   Special Requests   Final    BOTTLES DRAWN AEROBIC ONLY Blood Culture results may not be optimal due to an inadequate volume of blood received in culture bottles   Culture  Setup Time   Final    GRAM POSITIVE COCCI IN CHAINS AEROBIC BOTTLE ONLY CRITICAL VALUE NOTED.  VALUE IS CONSISTENT WITH PREVIOUSLY REPORTED AND CALLED VALUE.    Culture (A)  Final    STREPTOCOCCUS MUTANS SUSCEPTIBILITIES PERFORMED ON PREVIOUS CULTURE WITHIN THE LAST 5 DAYS. Performed at [facility], [address] [place], [place] [address]    Report Status 10/16/2023 FINAL  Final  Culture, blood (Routine X 2) w Reflex to ID Panel     Status: None (Preliminary result)   Collection Time: 10/15/23  2:21 PM   Specimen: BLOOD RIGHT ARM  Result Value Ref Range Status   Specimen Description BLOOD RIGHT ARM  Final   Special Requests   Final    BOTTLES DRAWN AEROBIC AND ANAEROBIC Blood Culture results may not be optimal due to an inadequate volume of blood received in culture bottles   Culture   Final    NO GROWTH 3 DAYS Performed at [facility], [address], [place], [place] [address]    Report Status PENDING  Incomplete  Culture, blood (Routine X 2) w Reflex to ID Panel     Status: None (Preliminary result)   Collection Time: 10/15/23  2:21 PM   Specimen: BLOOD RIGHT HAND  Result Value Ref Range Status   Specimen Description BLOOD RIGHT HAND  Final   Special Requests   Final    BOTTLES DRAWN AEROBIC AND ANAEROBIC Blood Culture adequate volume   Culture   Final    NO GROWTH 3 DAYS Performed at [facility], [address], [place], [place] [address]    Report Status PENDING  Incomplete   Pertinent Lab.    Latest Ref Rng & Units 10/18/2023    2:51 AM 10/17/2023    4:01 AM 10/16/2023    3:25 AM  CBC  WBC 4.0 - 10.5 K/uL 8.7  8.8  8.2   Hemoglobin 12.0 - 15.0 g/dL 7.4  7.4  7.3   Hematocrit 36.0 - 46.0 % 23.4  23.7  22.9   Platelets 150 - 400 K/uL 246  260  222  Latest Ref Rng & Units 10/18/2023    2:51 AM 10/17/2023    4:01 AM 10/16/2023    3:25 AM  CMP  Glucose 70 - 99 mg/dL 161  096  98   BUN 6 - 20 mg/dL 28  49  42   Creatinine 0.44 - 1.00 mg/dL 0.45  4.09  8.11   Sodium 135 - 145 mmol/L 139  141  140   Potassium 3.5 - 5.1 mmol/L 3.9  4.5  4.3   Chloride 98 - 111 mmol/L 103  107  107   CO2 22 - 32 mmol/L 26  22  25    Calcium 8.9 - 10.3 mg/dL 6.7  6.6  6.5      Pertinent Imaging today Plain films and CT images have been personally visualized and interpreted; radiology reports have been reviewed. Decision making incorporated into the Impression /   DG Orthopantogram Result Date: 10/17/2023 CLINICAL DATA:  Struck to coccal bacteremia EXAM: ORTHOPANTOGRAM/PANORAMIC COMPARISON:  None Available. FINDINGS: Panorex view of the mandible was performed. There are no acute bony abnormalities. No evidence of dental erosions or caries. Multiple prior teeth extractions. Visualized paranasal sinuses are clear. IMPRESSION: 1. Multiple prior teeth extractions. No evidence of acute dental disease. Electronically Signed   By: Sharlet Salina M.D.   On: 10/17/2023 20:33   ECHO TEE Result Date: 10/16/2023    TRANSESOPHOGEAL ECHO REPORT   Patient Name:   ALIANYS CHACKO Date of Exam: 10/16/2023 Medical Rec #:  [id number]    Height:       64.0 in Accession #:    [id number]   Weight:       176.6 lb Date of Birth:  Aug 02, 1980    BSA:          1.856 m Patient Age:    42 years     BP:           109/61 mmHg Patient Gender: F            HR:           80 bpm. Exam Location:  Inpatient Procedure: 3D Echo, Transesophageal Echo, Saline Contrast Bubble Study, Cardiac            Doppler and Color Doppler (Both Spectral and Color Flow Doppler were            utilized during procedure). Indications:     Endocarditis  History:         Patient has prior history of Echocardiogram examinations, most                  recent 10/12/2023. CHF; Risk Factors:Hypertension and Diabetes.  Sonographer:      Rosaland Lao Sonographer#2:   Irving Burton Senior Referring Phys:  [id number] Corrin Parker Diagnosing Phys: Lennie Odor MD PROCEDURE: After discussion of the risks and benefits of a TEE, an informed consent was obtained from the patient. TEE procedure time was 12 minutes. The transesophogeal probe was passed without difficulty through the esophogus of the patient. Imaged were obtained with the patient in a left lateral decubitus position. Sedation performed by different physician. The patient was monitored while under deep sedation. Anesthestetic sedation was provided intravenously by Anesthesiology: 170mg  of Propofol, 80mg  of Lidocaine. Image quality was excellent. The patient's vital signs; including heart rate, blood pressure, and oxygen saturation; remained stable throughout the procedure. The patient developed no complications during the procedure.  IMPRESSIONS  1. Left ventricular ejection fraction, by estimation, is 65 to 70%.  Left ventricular ejection fraction by 3D volume is 67 %. The left ventricle has normal function. The left ventricle has no regional wall motion abnormalities.  2. Right ventricular systolic function is normal. The right ventricular size is normal.  3. No left atrial/left atrial appendage thrombus was detected.  4. The mitral valve is grossly normal. Trivial mitral valve regurgitation. No evidence of mitral stenosis.  5. The aortic valve is tricuspid. Aortic valve regurgitation is not visualized. No aortic stenosis is present.  6. Agitated saline contrast bubble study was negative, with no evidence of any interatrial shunt.  7. 3D performed for the EF and demonstrates LVEF by 3D 67%. Conclusion(s)/Recommendation(s): No evidence of vegetation/infective endocarditis on this transesophageael echocardiogram. FINDINGS  Left Ventricle: Left ventricular ejection fraction, by estimation, is 65 to 70%. Left ventricular ejection fraction by 3D volume is 67 %. The left ventricle has normal  function. The left ventricle has no regional wall motion abnormalities. The left ventricular internal cavity size was normal in size. Right Ventricle: The right ventricular size is normal. No increase in right ventricular wall thickness. Right ventricular systolic function is normal. Left Atrium: Left atrial size was normal in size. No left atrial/left atrial appendage thrombus was detected. Right Atrium: Right atrial size was normal in size. Pericardium: Trivial pericardial effusion is present. Mitral Valve: The mitral valve is grossly normal. Trivial mitral valve regurgitation. No evidence of mitral valve stenosis. There is no evidence of mitral valve vegetation. Tricuspid Valve: The tricuspid valve is grossly normal. Tricuspid valve regurgitation is trivial. No evidence of tricuspid stenosis. There is no evidence of tricuspid valve vegetation. Aortic Valve: The aortic valve is tricuspid. Aortic valve regurgitation is not visualized. No aortic stenosis is present. There is no evidence of aortic valve vegetation. Pulmonic Valve: The pulmonic valve was grossly normal. Pulmonic valve regurgitation is not visualized. No evidence of pulmonic stenosis. There is no evidence of pulmonic valve vegetation. Aorta: The aortic root and ascending aorta are structurally normal, with no evidence of dilitation. Venous: The left lower pulmonary vein, left upper pulmonary vein, right lower pulmonary vein and right upper pulmonary vein are normal. IAS/Shunts: The atrial septum is grossly normal. Agitated saline contrast was given intravenously to evaluate for intracardiac shunting. Agitated saline contrast bubble study was negative, with no evidence of any interatrial shunt. Additional Comments: Spectral Doppler performed.  3D Volume EF LV 3D EF:    Left ventricular ejection fraction by 3D volume is 67              %.  3D Volume EF LV 3D EF:    67.20 % LV 3D EDV:   104800.00 mm LV 3D ESV:   34300.00 mm LV 3D SV:    70500.00 mm  3D  Volume EF: 3D EF:        67 %  AORTA Ao Root diam: 2.98 cm Ao Asc diam:  3.00 cm Lennie Odor MD Electronically signed by Lennie Odor MD Signature Date/Time: 10/16/2023/2:05:15 PM    Final    EP STUDY Result Date: 10/16/2023 See surgical note for result.  DG CHEST PORT 1 VIEW Result Date: 10/13/2023 CLINICAL DATA:  Fever. EXAM: PORTABLE CHEST 1 VIEW COMPARISON:  October 11, 2023. FINDINGS: Stable cardiomediastinal silhouette. Both lungs are clear. The visualized skeletal structures are unremarkable. IMPRESSION: No active disease. Electronically Signed   By: Lupita Raider M.D.   On: 10/13/2023 20:03   VAS [place] LOWER EXTREMITY VENOUS (DVT) Result Date: 10/12/2023  Lower Venous  DVT Study Patient Name:  KEARSTYN AVITIA  Date of Exam:   10/12/2023 Medical Rec #: [id number]     Accession #:    [id number] Date of Birth: 1981/01/07     Patient Gender: F Patient Age:   65 years Exam Location:  [facility] Procedure:      VAS [place] LOWER EXTREMITY VENOUS (DVT) Referring Phys: [facility] EZENDUKA --------------------------------------------------------------------------------  Indications: Edema.  Risk Factors: Past pregnancy. Comparison Study: No significant changes seen since the previous exam 08/31/22. Performing Technologist: Shona Simpson  Examination Guidelines: A complete evaluation includes B-mode imaging, spectral Doppler, color Doppler, and power Doppler as needed of all accessible portions of each vessel. Bilateral testing is considered an integral part of a complete examination. Limited examinations for reoccurring indications may be performed as noted. The reflux portion of the exam is performed with the patient in reverse Trendelenburg.  +---------+---------------+---------+-----------+----------+--------------+ RIGHT    CompressibilityPhasicitySpontaneityPropertiesThrombus Aging +---------+---------------+---------+-----------+----------+--------------+ CFV      Full           Yes      Yes                                  +---------+---------------+---------+-----------+----------+--------------+ SFJ      Full                                                        +---------+---------------+---------+-----------+----------+--------------+ FV Prox  Full                                                        +---------+---------------+---------+-----------+----------+--------------+ FV Mid   Full                                                        +---------+---------------+---------+-----------+----------+--------------+ FV DistalFull                    Yes                                 +---------+---------------+---------+-----------+----------+--------------+ PFV      Full                                                        +---------+---------------+---------+-----------+----------+--------------+ POP      Full           Yes      Yes                                 +---------+---------------+---------+-----------+----------+--------------+ PTV      Full                                                        +---------+---------------+---------+-----------+----------+--------------+  PERO     Full                                                        +---------+---------------+---------+-----------+----------+--------------+   +---------+---------------+---------+-----------+----------+--------------+ LEFT     CompressibilityPhasicitySpontaneityPropertiesThrombus Aging +---------+---------------+---------+-----------+----------+--------------+ CFV      Full           Yes      Yes                                 +---------+---------------+---------+-----------+----------+--------------+ SFJ      Full                                                        +---------+---------------+---------+-----------+----------+--------------+ FV Prox  Full                                                         +---------+---------------+---------+-----------+----------+--------------+ FV Mid   Full                                                        +---------+---------------+---------+-----------+----------+--------------+ FV DistalFull                                                        +---------+---------------+---------+-----------+----------+--------------+ PFV      Full                                                        +---------+---------------+---------+-----------+----------+--------------+ POP      Full           Yes      Yes                                 +---------+---------------+---------+-----------+----------+--------------+ PTV      Full                                                        +---------+---------------+---------+-----------+----------+--------------+ PERO     Full                                                        +---------+---------------+---------+-----------+----------+--------------+  Summary: BILATERAL: - No evidence of deep vein thrombosis seen in the lower extremities, bilaterally. -No evidence of popliteal cyst, bilaterally.   *See table(s) above for measurements and observations. Electronically signed by Heath Lark on 10/12/2023 at 6:44:55 PM.    Final    ECHOCARDIOGRAM COMPLETE Result Date: 10/12/2023    ECHOCARDIOGRAM REPORT   Patient Name:   NOLAN LASSER Date of Exam: 10/12/2023 Medical Rec #:  [id number]    Height:       64.0 in Accession #:    [id number]   Weight:       175.0 lb Date of Birth:  [birth year]    BSA:          1.848 m Patient Age:    42 years     BP:           125/75 mmHg Patient Gender: F            HR:           103 bpm. Exam Location:  Inpatient Procedure: 2D Echo, Cardiac Doppler and Color Doppler (Both Spectral and Color            Flow Doppler were utilized during procedure). Indications:    Dyspnea  History:        Patient has prior history of Echocardiogram examinations, most                  recent 07/05/2019. CHF, Arrythmias:Atrial Fibrillation; Risk                 Factors:Diabetes.  Sonographer:    Amy Chionchio Referring Phys: [id number] Monica Martinez EZENDUKA IMPRESSIONS  1. Left ventricular ejection fraction, by estimation, is 60 to 65%. The left ventricle has normal function. The left ventricle has no regional wall motion abnormalities. There is moderate left ventricular hypertrophy. Left ventricular diastolic parameters are consistent with Grade I diastolic dysfunction (impaired relaxation).  2. Right ventricular systolic function is normal. The right ventricular size is normal.  3. The mitral valve is normal in structure. Trivial mitral valve regurgitation. No evidence of mitral stenosis.  4. The aortic valve is normal in structure. Aortic valve regurgitation is not visualized. No aortic stenosis is present.  5. The inferior vena cava is normal in size with greater than 50% respiratory variability, suggesting right atrial pressure of 3 mmHg. FINDINGS  Left Ventricle: Left ventricular ejection fraction, by estimation, is 60 to 65%. The left ventricle has normal function. The left ventricle has no regional wall motion abnormalities. The left ventricular internal cavity size was normal in size. There is  moderate left ventricular hypertrophy. Left ventricular diastolic parameters are consistent with Grade I diastolic dysfunction (impaired relaxation). Right Ventricle: The right ventricular size is normal. No increase in right ventricular wall thickness. Right ventricular systolic function is normal. Left Atrium: Left atrial size was normal in size. Right Atrium: Right atrial size was normal in size. Pericardium: There is no evidence of pericardial effusion. Mitral Valve: The mitral valve is normal in structure. Trivial mitral valve regurgitation. No evidence of mitral valve stenosis. MV peak gradient, 8.2 mmHg. The mean mitral valve gradient is 4.0 mmHg. Tricuspid Valve: The tricuspid valve is normal in  structure. Tricuspid valve regurgitation is not demonstrated. No evidence of tricuspid stenosis. Aortic Valve: The aortic valve is normal in structure. Aortic valve regurgitation is not visualized. No aortic stenosis is present. Aortic valve mean gradient measures 9.0 mmHg. Aortic valve peak gradient measures 14.6 mmHg. Aortic valve area,  by VTI measures 3.00 cm. Pulmonic Valve: The pulmonic valve was normal in structure. Pulmonic valve regurgitation is not visualized. No evidence of pulmonic stenosis. Aorta: The aortic root is normal in size and structure. Venous: The inferior vena cava is normal in size with greater than 50% respiratory variability, suggesting right atrial pressure of 3 mmHg. IAS/Shunts: No atrial level shunt detected by color flow Doppler.  LEFT VENTRICLE PLAX 2D LVIDd:         3.40 cm     Diastology LVIDs:         1.90 cm     LV e' medial:    6.09 cm/s LV PW:         1.30 cm     LV E/e' medial:  14.5 LV IVS:        1.40 cm     LV e' lateral:   7.46 cm/s LVOT diam:     2.10 cm     LV E/e' lateral: 11.8 LV SV:         88 LV SV Index:   48 LVOT Area:     3.46 cm  LV Volumes (MOD) LV vol d, MOD A2C: 68.8 ml LV vol d, MOD A4C: 99.7 ml LV vol s, MOD A2C: 14.6 ml LV vol s, MOD A4C: 29.6 ml LV SV MOD A2C:     54.2 ml LV SV MOD A4C:     99.7 ml LV SV MOD BP:      64.1 ml RIGHT VENTRICLE          IVC RV Basal diam:  2.50 cm  IVC diam: 1.50 cm TAPSE (M-mode): 2.3 cm LEFT ATRIUM             Index        RIGHT ATRIUM           Index LA Vol (A2C):   39.1 ml 21.15 ml/m  RA Area:     12.40 cm LA Vol (A4C):   54.5 ml 29.48 ml/m  RA Volume:   25.90 ml  14.01 ml/m LA Biplane Vol: 48.1 ml 26.02 ml/m  AORTIC VALVE                     PULMONIC VALVE AV Area (Vmax):    2.90 cm      PV Vmax:       1.27 m/s AV Area (Vmean):   2.76 cm      PV Peak grad:  6.5 mmHg AV Area (VTI):     3.00 cm AV Vmax:           191.00 cm/s AV Vmean:          147.000 cm/s AV VTI:            0.293 m AV Peak Grad:      14.6 mmHg AV  Mean Grad:      9.0 mmHg LVOT Vmax:         160.00 cm/s LVOT Vmean:        117.000 cm/s LVOT VTI:          0.254 m LVOT/AV VTI ratio: 0.87  AORTA Ao Root diam: 3.30 cm Ao Asc diam:  3.50 cm MITRAL VALVE MV Area (PHT): 4.39 cm     SHUNTS MV Area VTI:   3.37 cm     Systemic VTI:  0.25 m MV Peak grad:  8.2 mmHg     Systemic Diam: 2.10 cm MV Mean grad:  4.0 mmHg MV Vmax:       1.43 m/s MV Vmean:      98.8 cm/s MV Decel Time: 173 msec MV E velocity: 88.10 cm/s MV A velocity: 104.00 cm/s MV E/A ratio:  0.85 Donato Schultz MD Electronically signed by Donato Schultz MD Signature Date/Time: 10/12/2023/2:32:28 PM    Final    DG Chest 2 View Result Date: 10/11/2023 CLINICAL DATA:  Midline chest pain. EXAM: CHEST - 2 VIEW COMPARISON:  08/29/2022. FINDINGS: Bilateral lung fields are clear. Bilateral costophrenic angles are clear. Note is made of elevated right hemidiaphragm. Normal cardio-mediastinal silhouette. No acute osseous abnormalities. The soft tissues are within normal limits. IMPRESSION: No active cardiopulmonary disease. Electronically Signed   By: Jules Schick M.D.   On: 10/11/2023 09:42   I have personally spent 51  minutes involved in face-to-face and non-face-to-face activities for this patient on the day of the visit. Professional time spent includes the following activities: Preparing to see the patient (review of tests), Obtaining and/or reviewing separately obtained history (admission/discharge record), Performing a medically appropriate examination and/or evaluation , Ordering medications/tests/procedures, referring and communicating with other health care professionals, Documenting clinical information in the EMR, Independently interpreting results (not separately reported), Communicating results to the patient/family/caregiver, Counseling and educating the patient/family/caregiver and Care coordination (not separately reported).   Plan d/w requesting provider as well as ID pharm D  Of note, portions  of this note may have been created with voice recognition software. While this note has been edited for accuracy, occasional wrong-word or 'sound-a-like' substitutions may have occurred due to the inherent limitations of voice recognition software.   Electronically signed by:   Odette Fraction, MD Infectious Disease Physician [facility] Pager: [phone]

## 2023-10-18 NOTE — Plan of Care (Signed)
  Problem: Health Behavior/Discharge Planning: Goal: Ability to manage health-related needs will improve Outcome: Progressing   Problem: Clinical Measurements: Goal: Ability to maintain clinical measurements within normal limits will improve Outcome: Progressing   Problem: Nutrition: Goal: Adequate nutrition will be maintained Outcome: Progressing   Problem: Pain Managment: Goal: General experience of comfort will improve and/or be controlled Outcome: Progressing

## 2023-10-19 ENCOUNTER — Encounter (HOSPITAL_COMMUNITY)

## 2023-10-19 ENCOUNTER — Inpatient Hospital Stay (HOSPITAL_COMMUNITY)

## 2023-10-19 DIAGNOSIS — R7881 Bacteremia: Secondary | ICD-10-CM | POA: Diagnosis not present

## 2023-10-19 DIAGNOSIS — N186 End stage renal disease: Secondary | ICD-10-CM | POA: Diagnosis not present

## 2023-10-19 DIAGNOSIS — R197 Diarrhea, unspecified: Secondary | ICD-10-CM

## 2023-10-19 DIAGNOSIS — N189 Chronic kidney disease, unspecified: Secondary | ICD-10-CM | POA: Diagnosis not present

## 2023-10-19 DIAGNOSIS — T82858A Stenosis of vascular prosthetic devices, implants and grafts, initial encounter: Secondary | ICD-10-CM | POA: Diagnosis not present

## 2023-10-19 DIAGNOSIS — D631 Anemia in chronic kidney disease: Secondary | ICD-10-CM | POA: Diagnosis not present

## 2023-10-19 DIAGNOSIS — I5030 Unspecified diastolic (congestive) heart failure: Secondary | ICD-10-CM | POA: Diagnosis not present

## 2023-10-19 DIAGNOSIS — I132 Hypertensive heart and chronic kidney disease with heart failure and with stage 5 chronic kidney disease, or end stage renal disease: Secondary | ICD-10-CM | POA: Diagnosis not present

## 2023-10-19 DIAGNOSIS — E872 Acidosis, unspecified: Secondary | ICD-10-CM | POA: Diagnosis not present

## 2023-10-19 DIAGNOSIS — Z992 Dependence on renal dialysis: Secondary | ICD-10-CM | POA: Diagnosis not present

## 2023-10-19 HISTORY — PX: IR AV DIALY SHUNT INTRO NEEDLE/INTRACATH INITIAL W/PTA/IMG LEFT: IMG6103

## 2023-10-19 LAB — CBC WITH DIFFERENTIAL/PLATELET
Abs Immature Granulocytes: 0.04 10*3/uL (ref 0.00–0.07)
Basophils Absolute: 0.1 10*3/uL (ref 0.0–0.1)
Basophils Relative: 1 %
Eosinophils Absolute: 0.2 10*3/uL (ref 0.0–0.5)
Eosinophils Relative: 3 %
HCT: 21.4 % — ABNORMAL LOW (ref 36.0–46.0)
Hemoglobin: 6.8 g/dL — CL (ref 12.0–15.0)
Immature Granulocytes: 1 %
Lymphocytes Relative: 24 %
Lymphs Abs: 1.7 10*3/uL (ref 0.7–4.0)
MCH: 30.5 pg (ref 26.0–34.0)
MCHC: 31.8 g/dL (ref 30.0–36.0)
MCV: 96 fL (ref 80.0–100.0)
Monocytes Absolute: 0.5 10*3/uL (ref 0.1–1.0)
Monocytes Relative: 6 %
Neutro Abs: 4.6 10*3/uL (ref 1.7–7.7)
Neutrophils Relative %: 65 %
Platelets: 245 10*3/uL (ref 150–400)
RBC: 2.23 MIL/uL — ABNORMAL LOW (ref 3.87–5.11)
RDW: 13.6 % (ref 11.5–15.5)
WBC: 7.1 10*3/uL (ref 4.0–10.5)
nRBC: 0 % (ref 0.0–0.2)

## 2023-10-19 LAB — RENAL FUNCTION PANEL
Albumin: 2.2 g/dL — ABNORMAL LOW (ref 3.5–5.0)
Anion gap: 9 (ref 5–15)
BUN: 35 mg/dL — ABNORMAL HIGH (ref 6–20)
CO2: 25 mmol/L (ref 22–32)
Calcium: 6.2 mg/dL — CL (ref 8.9–10.3)
Chloride: 105 mmol/L (ref 98–111)
Creatinine, Ser: 4.99 mg/dL — ABNORMAL HIGH (ref 0.44–1.00)
GFR, Estimated: 10 mL/min — ABNORMAL LOW (ref >60)
Glucose, Bld: 162 mg/dL — ABNORMAL HIGH (ref 70–99)
Phosphorus: 6 mg/dL — ABNORMAL HIGH (ref 2.5–4.6)
Potassium: 4.1 mmol/L (ref 3.5–5.1)
Sodium: 139 mmol/L (ref 135–145)

## 2023-10-19 LAB — PREPARE RBC (CROSSMATCH)

## 2023-10-19 MED ORDER — IOHEXOL 300 MG/ML  SOLN
100.0000 mL | Freq: Once | INTRAMUSCULAR | Status: AC | PRN
  Administered 2023-10-19: 15 mL via INTRAVENOUS

## 2023-10-19 MED ORDER — SODIUM CHLORIDE 0.9% IV SOLUTION: Freq: Once | INTRAVENOUS | Status: DC

## 2023-10-19 MED ORDER — CEFAZOLIN SODIUM-DEXTROSE 2-4 GM/100ML-% IV SOLN
INTRAVENOUS | Status: AC
  Filled 2023-10-19: qty 100

## 2023-10-19 MED ORDER — MIDAZOLAM HCL 2 MG/2ML IJ SOLN
INTRAMUSCULAR | Status: AC | PRN
Start: 2023-10-19 — End: 2023-10-19
  Administered 2023-10-19: .5 mg via INTRAVENOUS
  Administered 2023-10-19: 1 mg via INTRAVENOUS

## 2023-10-19 MED ORDER — MIDAZOLAM HCL 2 MG/2ML IJ SOLN
INTRAMUSCULAR | Status: AC
  Filled 2023-10-19: qty 2

## 2023-10-19 MED ORDER — FENTANYL CITRATE (PF) 100 MCG/2ML IJ SOLN
INTRAMUSCULAR | Status: AC | PRN
  Administered 2023-10-19: 25 ug via INTRAVENOUS
  Administered 2023-10-19: 50 ug via INTRAVENOUS

## 2023-10-19 MED ORDER — LIDOCAINE HCL 1 % IJ SOLN
20.0000 mL | Freq: Once | INTRAMUSCULAR | Status: AC
  Administered 2023-10-19: 3 mL via INTRADERMAL
  Filled 2023-10-19: qty 20

## 2023-10-19 MED ORDER — IOHEXOL 300 MG/ML  SOLN
100.0000 mL | Freq: Once | INTRAMUSCULAR | Status: AC | PRN
  Administered 2023-10-19: 26 mL via INTRAVENOUS

## 2023-10-19 MED ORDER — LIDOCAINE-EPINEPHRINE 1 %-1:100000 IJ SOLN
20.0000 mL | Freq: Once | INTRAMUSCULAR | Status: DC
  Filled 2023-10-19: qty 20

## 2023-10-19 MED ORDER — CEFAZOLIN IV (FOR PTA / DISCHARGE USE ONLY): 2.0000 g | INTRAVENOUS | Status: AC

## 2023-10-19 MED ORDER — LIDOCAINE HCL 1 % IJ SOLN
INTRAMUSCULAR | Status: AC
  Filled 2023-10-19: qty 20

## 2023-10-19 MED ORDER — FENTANYL CITRATE (PF) 100 MCG/2ML IJ SOLN
INTRAMUSCULAR | Status: AC
  Filled 2023-10-19: qty 2

## 2023-10-19 MED ORDER — CALCIUM CARBONATE 1250 (500 CA) MG PO TABS
1.0000 | ORAL_TABLET | Freq: Two times a day (BID) | ORAL | Status: DC
  Administered 2023-10-19 – 2023-10-20 (×2): 1250 mg via ORAL
  Filled 2023-10-19 (×2): qty 1

## 2023-10-19 NOTE — Plan of Care (Signed)
  Problem: Health Behavior/Discharge Planning: Goal: Ability to manage health-related needs will improve Outcome: Progressing   Problem: Clinical Measurements: Goal: Ability to maintain clinical measurements within normal limits will improve Outcome: Progressing   Problem: Nutrition: Goal: Adequate nutrition will be maintained Outcome: Progressing   Problem: Pain Managment: Goal: General experience of comfort will improve and/or be controlled Outcome: Progressing   Problem: Nutritional: Goal: Ability to make healthy dietary choices will improve Outcome: Progressing   Problem: Clinical Measurements: Goal: Complications related to the disease process, condition or treatment will be avoided or minimized Outcome: Progressing

## 2023-10-19 NOTE — Consult Note (Signed)
 Chief Complaint: Patient was seen in consultation today for  Chief Complaint  Patient presents with   Malfunctioning left AV graft   Referring Physician(s): Dr. Glenna Fellows   Supervising Physician: Oley Balm  Patient Status: [facility] - In-pt  History of Present Illness: Jacqueline [name] is a 43 y.o. female with a medical history significant for HTN, DM2, heart failure, and chronic kidney disease stage V. She presented to the ED 10/11/23 with multiple complaints including chest pain, muscle pain, malaise and fevers. She initially presented to the [facility] and was transferred to [facility] for critically low calcium levels. She is followed by Dr. Ronalee Belts with [facility] and underwent left upper extremity AV graft placement 05/02/23. At the time of her admission she had not yet had any hemodialysis sessions.   Nephrology evaluated the patient at the hospital and determined she had progressed to requiring hemodialysis and she received her first session 3/26 via her graft. Work up was also notable for UTI and bacteremia. She is currently receiving antibiotics.  She has been clinically stabilized and was scheduled for discharge home today after hemodialysis. However, during her HD session today the graft was not working appropriately and the arterial line was pulling clots.   Interventional Radiology has been asked to evaluate this patient for an image-guided fistulogram with possible intervention, possible placement of a tunneled dialysis catheter. Procedure approved by Dr. Deanne Coffer.   Past Medical History:  Diagnosis Date   Anemia    Anxiety    Chronic bronchitis (HCC)    Chronic kidney disease    Hypertension    Increased frequency of headaches    Morbid obesity (HCC)    Necrotizing fasciitis (HCC)    Sleep apnea    does not use CPAP - - gastric bypass   Type II diabetes mellitus (HCC)    no meds, diet controlled    Past Surgical History:  Procedure Laterality Date   AV  FISTULA PLACEMENT Left 05/02/2023   Procedure: LEFT ARM ARTERIOVENOUS (AV) FISTULA  GRAFT;  Surgeon: Daria Pastures, MD;  Location: [facility] OR;  Service: Vascular;  Laterality: Left;   BARIATRIC SURGERY     BREAST REDUCTION SURGERY  03/21/2017   CARDIAC CATHETERIZATION  01/06/2016   CARDIAC CATHETERIZATION N/A 01/06/2016   Procedure: Left Heart Cath and Coronary Angiography;  Surgeon: Iran Ouch, MD;  Location: [facility] INVASIVE CV LAB;  Service: Cardiovascular;  Laterality: N/A;   CESAREAN SECTION  08/2014   I & D EXTREMITY Right 06/18/2022   Procedure: IRRIGATION AND DEBRIDEMENT RIGHT ABDOMEN AND THIGH;  Surgeon: Kinsinger, De Blanch, MD;  Location: [facility] OR;  Service: General;  Laterality: Right;   I & D EXTREMITY Right 06/20/2022   Procedure: WOUND EXPLORATION OF RIGHT THIGH AND RIGHT GROIN WITH IRRIGATION AND DEBRIDEMENT;  Surgeon: Griselda Miner, MD;  Location: [facility] OR;  Service: General;  Laterality: Right;   REDUCTION MAMMAPLASTY Bilateral 03/21/2017   TRANSESOPHAGEAL ECHOCARDIOGRAM (CATH LAB) N/A 10/16/2023   Procedure: TRANSESOPHAGEAL ECHOCARDIOGRAM;  Surgeon: Sande Rives, MD;  Location: [facility] INVASIVE CV LAB;  Service: Cardiovascular;  Laterality: N/A;    Allergies: Patient has no known allergies.  Medications: Prior to Admission medications   Medication Sig Start Date End Date Taking? Authorizing Provider  amLODipine (NORVASC) 10 MG tablet Take 1 tablet (10 mg total) by mouth daily. 07/07/22  Yes Angiulli, Mcarthur Rossetti, PA-C  calcitRIOL (ROCALTROL) 0.5 MCG capsule Take 0.5 mcg by mouth 3 (three) times daily. 10/03/23  Yes  [provider]  ceFAZolin (ANCEF) IVPB Inject 2 g into the vein Every Tuesday,Thursday,and Saturday with dialysis for 9 days. Indication:  Strep bacteremia Last Day of Therapy:  10/28/23 Labs - Once weekly:  CBC/D and BMP, Labs - Once weekly: ESR and CRP Method of administration: per HD protocol, to be given at the HD center 10/19/23 10/28/23 Yes Manandhar,  Rozell Searing, MD  ciclopirox (PENLAC) 8 % solution Apply 1 Application topically at bedtime. Apply over nail and surrounding skin. Apply daily over previous coat. After seven (7) days, may remove with alcohol and continue cycle.   Yes [provider]  furosemide (LASIX) 40 MG tablet Take 1 tablet (40 mg total) by mouth daily. Patient taking differently: Take 80 mg by mouth daily. 07/07/22  Yes Angiulli, Mcarthur Rossetti, PA-C  gabapentin (NEURONTIN) 300 MG capsule Take 1 capsule (300 mg total) by mouth at bedtime. 07/07/22  Yes Angiulli, Mcarthur Rossetti, PA-C  hydrALAZINE (APRESOLINE) 50 MG tablet Take 50 mg by mouth 3 (three) times daily.   Yes [provider]  Vitamin D, Ergocalciferol, (DRISDOL) 1.25 MG (50000 UNIT) CAPS capsule Take 1 capsule (50,000 Units total) by mouth every Thursday. Patient taking differently: Take 50,000 Units by mouth every 7 (seven) days. Mondays 07/07/22  Yes Angiulli, Mcarthur Rossetti, PA-C  calcitRIOL (ROCALTROL) 0.25 MCG capsule Take 1 capsule (0.25 mcg total) by mouth daily. Patient not taking: Reported on 10/11/2023 07/07/22   Angiulli, Mcarthur Rossetti, PA-C  ondansetron (ZOFRAN-ODT) 4 MG disintegrating tablet Take 1 tablet (4 mg total) by mouth every 8 (eight) hours as needed for nausea or vomiting. Patient not taking: Reported on 10/11/2023 05/09/23   Lonell Grandchild, MD  oxyCODONE-acetaminophen (PERCOCET/ROXICET) 5-325 MG tablet Take 1 tablet by mouth every 6 (six) hours as needed. Patient not taking: Reported on 10/11/2023 05/02/23   Lars Mage, PA-C  sertraline (ZOLOFT) 100 MG tablet Take 1 tablet (100 mg total) by mouth at bedtime. Patient not taking: Reported on 10/11/2023 07/07/22   Angiulli, Mcarthur Rossetti, PA-C  sodium bicarbonate 650 MG tablet Take 2 tablets (1,300 mg total) by mouth 2 (two) times daily. Patient not taking: Reported on 10/11/2023 07/07/22   Angiulli, Mcarthur Rossetti, PA-C     Family History  Problem Relation Age of Onset   Diabetes Mother    Hypertension  Mother    Thyroid disease Mother    Kidney disease Maternal Grandmother    Diabetes Maternal Grandmother    Heart attack Other     Social History   Socioeconomic History   Marital status: Married    Spouse name: Not on file   Number of children: Not on file   Years of education: Not on file   Highest education level: Not on file  Occupational History   Not on file  Tobacco Use   Smoking status: Never   Smokeless tobacco: Never  Vaping Use   Vaping status: Never Used  Substance and Sexual Activity   Alcohol use: No   Drug use: No   Sexual activity: Yes    Birth control/protection: None  Other Topics Concern   Not on file  Social History Narrative   Not on file   Social Drivers of Health   Financial Resource Strain: Not on file  Food Insecurity: No Food Insecurity (10/11/2023)   Hunger Vital Sign    Worried About Running Out of Food in the Last Year: Never true    Ran Out of Food in the Last Year: Never true  Transportation Needs: No Transportation Needs (10/11/2023)   PRAPARE - Administrator, Civil Service (Medical): No    Lack of Transportation (Non-Medical): No  Physical Activity: Not on file  Stress: Not on file  Social Connections: Socially Integrated (10/11/2023)   Social Connection and Isolation Panel [NHANES]    Frequency of Communication with Friends and Family: More than three times a week    Frequency of Social Gatherings with Friends and Family: More than three times a week    Attends Religious Services: 1 to 4 times per year    Active Member of [employer] or Organizations: Yes    Attends Engineer, structural: More than 4 times per year    Marital Status: Married    Review of Systems: A 12 point ROS discussed and pertinent positives are indicated in the HPI above.  All other systems are negative.  Review of Systems  All other systems reviewed and are negative.   Vital Signs: BP 125/73   Pulse 86   Temp 98.1 F (36.7 C) (Oral)    Resp 17   Ht 5\' 4"  (1.626 m)   Wt 182 lb 15.7 oz (83 kg)   LMP 10/09/2023 (Exact Date)   SpO2 100%   BMI 31.41 kg/m   Physical Exam Constitutional:      General: She is not in acute distress.    Appearance: She is not ill-appearing.  HENT:     Mouth/Throat:     Mouth: Mucous membranes are moist.     Pharynx: Oropharynx is clear.  Cardiovascular:     Rate and Rhythm: Normal rate.     Comments: Left upper extremity graft. Site is unremarkable. Positive for thrill and bruit.  Pulmonary:     Effort: Pulmonary effort is normal.  Abdominal:     Tenderness: There is no abdominal tenderness.  Skin:    General: Skin is warm and dry.  Neurological:     Mental Status: She is alert and oriented to person, place, and time.     Imaging: MR THORACIC SPINE WO CONTRAST Result Date: 10/18/2023 CLINICAL DATA:  43 year old female with neck pain and Streptococcal bacteremia. EXAM: MRI THORACIC SPINE WITHOUT CONTRAST TECHNIQUE: Multiplanar, multisequence MR imaging of the thoracic spine was performed. No intravenous contrast was administered. COMPARISON:  Cervical spine MRI today reported separately. Chest CTA 10/01/2019, 05/23/2017. CT Abdomen and Pelvis 05/09/2023. FINDINGS: Limited cervical spine imaging:  Reported separately today. Thoracic spine segmentation:  Appears normal on the 2018 CTA. Alignment: Stable thoracic kyphosis since 2021, mildly exaggerated in the upper thoracic spine. Subtle levoconvex thoracic scoliosis. Vertebrae: Chronic flowing endplate osteophytes throughout the thoracic spine from the T2 level through T11 on the 2021 CTA, resulting in interbody ankylosis of those levels at that time. Some associated costovertebral ankylosis also. On the CT Abdomen and Pelvis last year lower thoracic interbody ankylosis appeared solid through the T12 level (with presumed transitional lumbosacral anatomy). Maintained thoracic vertebral height. Background bone marrow signal within normal limits. No  thoracic marrow edema is identified. However, the L1 superior endplate is abnormal. The superior endplate appears compressed asymmetrically to the right (new compared to 05/09/2023) with estimated 25% loss of vertebral body height. Associated T2 and STIR hyperintensity at the superior endplate there (series 11, image 10). No retropulsion. But the adjacent T12-L1 disc appears relatively unremarkable, similar to other levels. And there is no paraspinal inflammation identified there. Cord: Normal; again CSF pulsation artifact is suspected on sagittal images. Spinal canal appears  unremarkable on series 14 axial images. Conus medullaris appears normal at T12-L1. Paraspinal and other soft tissues: Small layering pleural effusions are visible. Negative visible upper abdominal viscera. Thoracic paraspinal soft tissues remain within normal limits. Disc levels: Subtotal or total thoracic interbody ankylosis suspected as detailed above. No thoracic spinal stenosis. IMPRESSION: 1. Hyperostosis related thoracic spinal ankylosis. No evidence of acute or inflammatory process in the thoracic spine on this noncontrast exam. 2. However, compressed L1 superior endplate, new since 05/09/2023 with up to 25% loss of height. Mild associated marrow edema, but no other features of spinal infection there at this time. No retropulsion or other complicating features. 3. Small layering pleural effusions. Electronically Signed   By: Odessa Fleming M.D.   On: 10/18/2023 12:28   MR CERVICAL SPINE WO CONTRAST Result Date: 10/18/2023 CLINICAL DATA:  43 year old female with neck pain and Streptococcal bacteremia. EXAM: MRI CERVICAL SPINE WITHOUT CONTRAST TECHNIQUE: Multiplanar, multisequence MR imaging of the cervical spine was performed. No intravenous contrast was administered. COMPARISON:  Brain MRI 12/02/2019. FINDINGS: Alignment: Relatively normal cervical lordosis. Vertebrae: Visualized bone marrow signal is within normal limits. No marrow edema or  evidence of acute osseous abnormality. Cord: Maintained normal spinal cord signal and morphology. Heterogeneity within the spinal canal, particularly on sagittal STIR imaging (series 5, image 12) appears to be CSF pulsation artifact. No convincing intraspinal fluid collection by axial GRE or sagittal T1 imaging. Posterior Fossa, vertebral arteries, paraspinal tissues: Cervicomedullary junction is within normal limits. Negative visible posterior fossa. Preserved major vascular flow voids in the neck. Negative visible neck soft tissues, right lung apex. There is no prevertebral fluid or edema. Disc levels: Negative for age. No cervical spinal or significant foraminal stenosis. Mild lower cervical ligament flavum hypertrophy. IMPRESSION: Negative noncontrast MRI appearance of the Cervical spine. Electronically Signed   By: Odessa Fleming M.D.   On: 10/18/2023 12:17   DG Orthopantogram Result Date: 10/17/2023 CLINICAL DATA:  Struck to coccal bacteremia EXAM: ORTHOPANTOGRAM/PANORAMIC COMPARISON:  None Available. FINDINGS: Panorex view of the mandible was performed. There are no acute bony abnormalities. No evidence of dental erosions or caries. Multiple prior teeth extractions. Visualized paranasal sinuses are clear. IMPRESSION: 1. Multiple prior teeth extractions. No evidence of acute dental disease. Electronically Signed   By: Sharlet Salina M.D.   On: 10/17/2023 20:33   ECHO TEE Result Date: 10/16/2023    TRANSESOPHOGEAL ECHO REPORT   Patient Name:   Jacqueline [name] Date of Exam: 10/16/2023 Medical Rec #:  [id number]    Height:       64.0 in Accession #:    [id number]   Weight:       176.6 lb Date of Birth:  June 21, 1981    BSA:          1.856 m Patient Age:    42 years     BP:           109/61 mmHg Patient Gender: F            HR:           80 bpm. Exam Location:  Inpatient Procedure: 3D Echo, Transesophageal Echo, Saline Contrast Bubble Study, Cardiac            Doppler and Color Doppler (Both Spectral and Color Flow Doppler  were            utilized during procedure). Indications:     Endocarditis  History:         Patient has prior history  of Echocardiogram examinations, most                  recent 10/12/2023. CHF; Risk Factors:Hypertension and Diabetes.  Sonographer:     Rosaland Lao Sonographer#2:   Irving Burton Senior Referring Phys:  [id number] Corrin Parker Diagnosing Phys: Lennie Odor MD PROCEDURE: After discussion of the risks and benefits of a TEE, an informed consent was obtained from the patient. TEE procedure time was 12 minutes. The transesophogeal probe was passed without difficulty through the esophogus of the patient. Imaged were obtained with the patient in a left lateral decubitus position. Sedation performed by different physician. The patient was monitored while under deep sedation. Anesthestetic sedation was provided intravenously by Anesthesiology: 170mg  of Propofol, 80mg  of Lidocaine. Image quality was excellent. The patient's vital signs; including heart rate, blood pressure, and oxygen saturation; remained stable throughout the procedure. The patient developed no complications during the procedure.  IMPRESSIONS  1. Left ventricular ejection fraction, by estimation, is 65 to 70%. Left ventricular ejection fraction by 3D volume is 67 %. The left ventricle has normal function. The left ventricle has no regional wall motion abnormalities.  2. Right ventricular systolic function is normal. The right ventricular size is normal.  3. No left atrial/left atrial appendage thrombus was detected.  4. The mitral valve is grossly normal. Trivial mitral valve regurgitation. No evidence of mitral stenosis.  5. The aortic valve is tricuspid. Aortic valve regurgitation is not visualized. No aortic stenosis is present.  6. Agitated saline contrast bubble study was negative, with no evidence of any interatrial shunt.  7. 3D performed for the EF and demonstrates LVEF by 3D 67%. Conclusion(s)/Recommendation(s): No evidence of  vegetation/infective endocarditis on this transesophageael echocardiogram. FINDINGS  Left Ventricle: Left ventricular ejection fraction, by estimation, is 65 to 70%. Left ventricular ejection fraction by 3D volume is 67 %. The left ventricle has normal function. The left ventricle has no regional wall motion abnormalities. The left ventricular internal cavity size was normal in size. Right Ventricle: The right ventricular size is normal. No increase in right ventricular wall thickness. Right ventricular systolic function is normal. Left Atrium: Left atrial size was normal in size. No left atrial/left atrial appendage thrombus was detected. Right Atrium: Right atrial size was normal in size. Pericardium: Trivial pericardial effusion is present. Mitral Valve: The mitral valve is grossly normal. Trivial mitral valve regurgitation. No evidence of mitral valve stenosis. There is no evidence of mitral valve vegetation. Tricuspid Valve: The tricuspid valve is grossly normal. Tricuspid valve regurgitation is trivial. No evidence of tricuspid stenosis. There is no evidence of tricuspid valve vegetation. Aortic Valve: The aortic valve is tricuspid. Aortic valve regurgitation is not visualized. No aortic stenosis is present. There is no evidence of aortic valve vegetation. Pulmonic Valve: The pulmonic valve was grossly normal. Pulmonic valve regurgitation is not visualized. No evidence of pulmonic stenosis. There is no evidence of pulmonic valve vegetation. Aorta: The aortic root and ascending aorta are structurally normal, with no evidence of dilitation. Venous: The left lower pulmonary vein, left upper pulmonary vein, right lower pulmonary vein and right upper pulmonary vein are normal. IAS/Shunts: The atrial septum is grossly normal. Agitated saline contrast was given intravenously to evaluate for intracardiac shunting. Agitated saline contrast bubble study was negative, with no evidence of any interatrial shunt. Additional  Comments: Spectral Doppler performed.  3D Volume EF LV 3D EF:    Left ventricular ejection fraction by 3D volume is 67              %.  3D Volume EF LV 3D EF:    67.20 % LV 3D EDV:   104800.00 mm LV 3D ESV:   34300.00 mm LV 3D SV:    70500.00 mm  3D Volume EF: 3D EF:        67 %  AORTA Ao Root diam: 2.98 cm Ao Asc diam:  3.00 cm Lennie Odor MD Electronically signed by Lennie Odor MD Signature Date/Time: 10/16/2023/2:05:15 PM    Final    EP STUDY Result Date: 10/16/2023 See surgical note for result.  DG CHEST PORT 1 VIEW Result Date: 10/13/2023 CLINICAL DATA:  Fever. EXAM: PORTABLE CHEST 1 VIEW COMPARISON:  October 11, 2023. FINDINGS: Stable cardiomediastinal silhouette. Both lungs are clear. The visualized skeletal structures are unremarkable. IMPRESSION: No active disease. Electronically Signed   By: Lupita Raider M.D.   On: 10/13/2023 20:03   VAS [place] LOWER EXTREMITY VENOUS (DVT) Result Date: 10/12/2023  Lower Venous DVT Study Patient Name:  Jacqueline [name]  Date of Exam:   10/12/2023 Medical Rec #: [id number]     Accession #:    [id number] Date of Birth: April 30, 1981     Patient Gender: F Patient Age:   36 years Exam Location:  [facility] Procedure:      VAS [place] LOWER EXTREMITY VENOUS (DVT) Referring Phys: [facility] EZENDUKA --------------------------------------------------------------------------------  Indications: Edema.  Risk Factors: Past pregnancy. Comparison Study: No significant changes seen since the previous exam 08/31/22. Performing Technologist: Shona Simpson  Examination Guidelines: A complete evaluation includes B-mode imaging, spectral Doppler, color Doppler, and power Doppler as needed of all accessible portions of each vessel. Bilateral testing is considered an integral part of a complete examination. Limited examinations for reoccurring indications may be performed as noted. The reflux portion of the exam is performed with the patient in reverse Trendelenburg.   +---------+---------------+---------+-----------+----------+--------------+ RIGHT    CompressibilityPhasicitySpontaneityPropertiesThrombus Aging +---------+---------------+---------+-----------+----------+--------------+ CFV      Full           Yes      Yes                                 +---------+---------------+---------+-----------+----------+--------------+ SFJ      Full                                                        +---------+---------------+---------+-----------+----------+--------------+ FV Prox  Full                                                        +---------+---------------+---------+-----------+----------+--------------+ FV Mid   Full                                                        +---------+---------------+---------+-----------+----------+--------------+ FV DistalFull                    Yes                                 +---------+---------------+---------+-----------+----------+--------------+  PFV      Full                                                        +---------+---------------+---------+-----------+----------+--------------+ POP      Full           Yes      Yes                                 +---------+---------------+---------+-----------+----------+--------------+ PTV      Full                                                        +---------+---------------+---------+-----------+----------+--------------+ PERO     Full                                                        +---------+---------------+---------+-----------+----------+--------------+   +---------+---------------+---------+-----------+----------+--------------+ LEFT     CompressibilityPhasicitySpontaneityPropertiesThrombus Aging +---------+---------------+---------+-----------+----------+--------------+ CFV      Full           Yes      Yes                                  +---------+---------------+---------+-----------+----------+--------------+ SFJ      Full                                                        +---------+---------------+---------+-----------+----------+--------------+ FV Prox  Full                                                        +---------+---------------+---------+-----------+----------+--------------+ FV Mid   Full                                                        +---------+---------------+---------+-----------+----------+--------------+ FV DistalFull                                                        +---------+---------------+---------+-----------+----------+--------------+ PFV      Full                                                        +---------+---------------+---------+-----------+----------+--------------+  POP      Full           Yes      Yes                                 +---------+---------------+---------+-----------+----------+--------------+ PTV      Full                                                        +---------+---------------+---------+-----------+----------+--------------+ PERO     Full                                                        +---------+---------------+---------+-----------+----------+--------------+     Summary: BILATERAL: - No evidence of deep vein thrombosis seen in the lower extremities, bilaterally. -No evidence of popliteal cyst, bilaterally.   *See table(s) above for measurements and observations. Electronically signed by Heath Lark on 10/12/2023 at 6:44:55 PM.    Final    ECHOCARDIOGRAM COMPLETE Result Date: 10/12/2023    ECHOCARDIOGRAM REPORT   Patient Name:   Jacqueline [name] Date of Exam: 10/12/2023 Medical Rec #:  [id number]    Height:       64.0 in Accession #:    [id number]   Weight:       175.0 lb Date of Birth:  1980/10/28    BSA:          1.848 m Patient Age:    42 years     BP:           125/75 mmHg Patient Gender: F             HR:           103 bpm. Exam Location:  Inpatient Procedure: 2D Echo, Cardiac Doppler and Color Doppler (Both Spectral and Color            Flow Doppler were utilized during procedure). Indications:    Dyspnea  History:        Patient has prior history of Echocardiogram examinations, most                 recent 07/05/2019. CHF, Arrythmias:Atrial Fibrillation; Risk                 Factors:Diabetes.  Sonographer:    Amy Chionchio Referring Phys: [id number] Monica Martinez EZENDUKA IMPRESSIONS  1. Left ventricular ejection fraction, by estimation, is 60 to 65%. The left ventricle has normal function. The left ventricle has no regional wall motion abnormalities. There is moderate left ventricular hypertrophy. Left ventricular diastolic parameters are consistent with Grade I diastolic dysfunction (impaired relaxation).  2. Right ventricular systolic function is normal. The right ventricular size is normal.  3. The mitral valve is normal in structure. Trivial mitral valve regurgitation. No evidence of mitral stenosis.  4. The aortic valve is normal in structure. Aortic valve regurgitation is not visualized. No aortic stenosis is present.  5. The inferior vena cava is normal in size with greater than 50% respiratory variability, suggesting right atrial pressure of 3 mmHg. FINDINGS  Left Ventricle: Left ventricular ejection fraction, by estimation,  is 60 to 65%. The left ventricle has normal function. The left ventricle has no regional wall motion abnormalities. The left ventricular internal cavity size was normal in size. There is  moderate left ventricular hypertrophy. Left ventricular diastolic parameters are consistent with Grade I diastolic dysfunction (impaired relaxation). Right Ventricle: The right ventricular size is normal. No increase in right ventricular wall thickness. Right ventricular systolic function is normal. Left Atrium: Left atrial size was normal in size. Right Atrium: Right atrial size was normal in size.  Pericardium: There is no evidence of pericardial effusion. Mitral Valve: The mitral valve is normal in structure. Trivial mitral valve regurgitation. No evidence of mitral valve stenosis. MV peak gradient, 8.2 mmHg. The mean mitral valve gradient is 4.0 mmHg. Tricuspid Valve: The tricuspid valve is normal in structure. Tricuspid valve regurgitation is not demonstrated. No evidence of tricuspid stenosis. Aortic Valve: The aortic valve is normal in structure. Aortic valve regurgitation is not visualized. No aortic stenosis is present. Aortic valve mean gradient measures 9.0 mmHg. Aortic valve peak gradient measures 14.6 mmHg. Aortic valve area, by VTI measures 3.00 cm. Pulmonic Valve: The pulmonic valve was normal in structure. Pulmonic valve regurgitation is not visualized. No evidence of pulmonic stenosis. Aorta: The aortic root is normal in size and structure. Venous: The inferior vena cava is normal in size with greater than 50% respiratory variability, suggesting right atrial pressure of 3 mmHg. IAS/Shunts: No atrial level shunt detected by color flow Doppler.  LEFT VENTRICLE PLAX 2D LVIDd:         3.40 cm     Diastology LVIDs:         1.90 cm     LV e' medial:    6.09 cm/s LV PW:         1.30 cm     LV E/e' medial:  14.5 LV IVS:        1.40 cm     LV e' lateral:   7.46 cm/s LVOT diam:     2.10 cm     LV E/e' lateral: 11.8 LV SV:         88 LV SV Index:   48 LVOT Area:     3.46 cm  LV Volumes (MOD) LV vol d, MOD A2C: 68.8 ml LV vol d, MOD A4C: 99.7 ml LV vol s, MOD A2C: 14.6 ml LV vol s, MOD A4C: 29.6 ml LV SV MOD A2C:     54.2 ml LV SV MOD A4C:     99.7 ml LV SV MOD BP:      64.1 ml RIGHT VENTRICLE          IVC RV Basal diam:  2.50 cm  IVC diam: 1.50 cm TAPSE (M-mode): 2.3 cm LEFT ATRIUM             Index        RIGHT ATRIUM           Index LA Vol (A2C):   39.1 ml 21.15 ml/m  RA Area:     12.40 cm LA Vol (A4C):   54.5 ml 29.48 ml/m  RA Volume:   25.90 ml  14.01 ml/m LA Biplane Vol: 48.1 ml 26.02 ml/m   AORTIC VALVE                     PULMONIC VALVE AV Area (Vmax):    2.90 cm      PV Vmax:       1.27 m/s AV  Area (Vmean):   2.76 cm      PV Peak grad:  6.5 mmHg AV Area (VTI):     3.00 cm AV Vmax:           191.00 cm/s AV Vmean:          147.000 cm/s AV VTI:            0.293 m AV Peak Grad:      14.6 mmHg AV Mean Grad:      9.0 mmHg LVOT Vmax:         160.00 cm/s LVOT Vmean:        117.000 cm/s LVOT VTI:          0.254 m LVOT/AV VTI ratio: 0.87  AORTA Ao Root diam: 3.30 cm Ao Asc diam:  3.50 cm MITRAL VALVE MV Area (PHT): 4.39 cm     SHUNTS MV Area VTI:   3.37 cm     Systemic VTI:  0.25 m MV Peak grad:  8.2 mmHg     Systemic Diam: 2.10 cm MV Mean grad:  4.0 mmHg MV Vmax:       1.43 m/s MV Vmean:      98.8 cm/s MV Decel Time: 173 msec MV E velocity: 88.10 cm/s MV A velocity: 104.00 cm/s MV E/A ratio:  0.85 Donato Schultz MD Electronically signed by Donato Schultz MD Signature Date/Time: 10/12/2023/2:32:28 PM    Final    DG Chest 2 View Result Date: 10/11/2023 CLINICAL DATA:  Midline chest pain. EXAM: CHEST - 2 VIEW COMPARISON:  08/29/2022. FINDINGS: Bilateral lung fields are clear. Bilateral costophrenic angles are clear. Note is made of elevated right hemidiaphragm. Normal cardio-mediastinal silhouette. No acute osseous abnormalities. The soft tissues are within normal limits. IMPRESSION: No active cardiopulmonary disease. Electronically Signed   By: Jules Schick M.D.   On: 10/11/2023 09:42    Labs:  CBC: Recent Labs    10/16/23 0325 10/17/23 0401 10/18/23 0251 10/19/23 0313  WBC 8.2 8.8 8.7 7.1  HGB 7.3* 7.4* 7.4* 6.8*  HCT 22.9* 23.7* 23.4* 21.4*  PLT 222 260 246 245    COAGS: No results for input(s): "INR", "APTT" in the last 8760 hours.  BMP: Recent Labs    10/16/23 0325 10/17/23 0401 10/18/23 0251 10/19/23 0313  NA 140 141 139 139  K 4.3 4.5 3.9 4.1  CL 107 107 103 105  CO2 25 22 26 25   GLUCOSE 98 151* 140* 162*  BUN 42* 49* 28* 35*  CALCIUM 6.5* 6.6* 6.7* 6.2*  CREATININE  5.13* 5.97* 4.03* 4.99*  GFRNONAA 10* 8* 13* 10*    LIVER FUNCTION TESTS: Recent Labs    10/16/23 0325 10/17/23 0401 10/18/23 0251 10/19/23 0313  ALBUMIN 2.1* 2.4* 2.2* 2.2*    TUMOR MARKERS: No results for input(s): "AFPTM", "CEA", "CA199", "CHROMGRNA" in the last 8760 hours.  Assessment and Plan:  Malfunctioning left AV graft: Assyria Oatis, 42 year old female, is scheduled today for an image-guided fistulogram with possible intervention, possible placement of a tunneled dialysis catheter placement. The procedure was discussed with the patient at the bedside.   Risks and benefits discussed with the patient including, but not limited to bleeding, infection, vascular injury, pulmonary embolism, need for tunneled HD catheter placement or even death.  All of the patient's questions were answered, patient is agreeable to proceed. She has been NPO since 0800. Hemoglobin was 6.8 this morning and she is currently getting one unit of PRBCs   Consent signed and in  IR.   Thank you for this interesting consult.  I greatly enjoyed meeting [facility] and look forward to participating in their care.  A copy of this report was sent to the requesting provider on this date.  Electronically Signed: Alwyn Ren, AGACNP-BC 10/19/2023, 12:31 PM   I spent a total of 20 Minutes    in face to face in clinical consultation, greater than 50% of which was counseling/coordinating care for malfunctioning AV graft.

## 2023-10-19 NOTE — Progress Notes (Signed)
 Hgb is down to 6.8 this am. Plan to transfuse 1 unit RBC.

## 2023-10-19 NOTE — Progress Notes (Addendum)
 Contacted [facility] to inquire if clinic has iv cefazolin to provide to pt with HD at d/c. Awaiting a response. Will assist as needed.   Olivia Canter Renal Navigator [phone]  Addendum at 4:29 pm: Clinic has iv abx available to provide to pt at d/c.

## 2023-10-19 NOTE — Progress Notes (Signed)
 PROGRESS NOTE   Jacqueline [name]  UJW:[id number]    DOB: 1980-09-28    DOA: 10/11/2023  PCP: Harvest Forest, MD   I have briefly reviewed patients previous medical records in [facility].  Chief Complaint  Patient presents with   Chest Pain    Brief Hospital Course:  43 year old married female with kids, independent at baseline, medical history significant for hypertension, hyperlipidemia, DM 2, CKD stage V, chronic HFpEF, presented with complaints of generalized weakness, fevers, URI symptoms, nausea, vomiting and loose stools.  She was found to have severe hypocalcemia, CKD has progressed to new ESRD and fever/infectious workup revealed streptococcal bacteremia.  Nephrology was consulted, hemodialysis was initiated this admission.  ID was consulted, TTE and TEE were negative for infectious source, etiology of her bacteremia is unclear, currently on IV cefazolin across dialysis.  Surveillance blood cultures are negative to date.  ID has cleared patient for discharge but currently with malfunctioning AVG, awaiting IR intervention.   Assessment & Plan:  Principal Problem:   Hypocalcemia due to chronic kidney disease Active Problems:   Streptococcal bacteremia   Hypocalcemia/secondary hyperparathyroidism Calcium of 5.7 (corrected to 6.7 to account for hypoalbuminemia). Patient given calcium gluconate and managed with peritoneal dialysis. Calcium improved to 6.7 on 4/2 (corrected calcium of 8.1 for albumin of 2.2). Continue management per nephrology -Continue calcitriol 0.5 mcg 3 times daily.  Intact PTH 415.  Started Os-Cal.   Streptococcus mutans bacteremia Unclear source. Infectious disease consulted. Ceftriaxone started empirically and transitioned to Cefazolin after culture data became available. Transthoracic Echocardiogram and Transesophageal Echocardiogram without evidence of vegetation. -ID recommendations: continue Cefazolin IV x 2 weeks from negative blood cultures 3/30 with  end of treatment date 10/28/23.  Cefazolin will be given across dialysis and nephrology team are aware.  Monitor CBC and BMP on antibiotics.  Has follow-up at [facility] on 4/10 at 11:15 AM. -Surveillance blood cultures x 2 from 3/30: Negative to date. -Orthopantogram without any acute dental disease and MRI C-spine and T-spine without acute findings.   UTI Ruled out with insignificant growth on urine culture. Does not appear to be the source for bacteremia.   CKD stage V progressed to new ESRD Patient started on hemodialysis this admission. Management per nephology. Patient has been set up with outpatient hemodialysis with schedule of Tuesday, Thursday, Saturday. Per nephrology: Pt has been accepted at [facility] on TTS 12:00 chair time and can start anytime.  4/3, malfunctioning AVG, nephrology have consulted IR for evaluation and management.   Diabetes mellitus type 2 Well controlled. A1C of 4.4%.   Chronic HFpEF Stable/euvolemic. Fluid management with hemodialysis.  Essential hypertension Blood pressures on the softer side.  Hydralazine and amlodipine discontinued.  Remains on metoprolol 25 Mg twice daily, monitor.  Anemia of ESRD Continue Aranesp 100 mcg every week per nephrology, can continue this across dialysis. Hemoglobin was stable in the low 7 g range which dropped to 6.8 on 4/3, awaiting transfusion of 1 unit PRBC.  Follow posttransfusion CBC and aim to keep >7 g per DL.  Diarrhea/vomiting Noted since 4/2, no further emesis and tolerating diet.  Diarrhea improving.  Unclear etiology.  Diarrhea could be due to the antibiotics but cannot explain vomiting.  Close monitoring.  Body mass index is 31.41 kg/m.   DVT prophylaxis: Place and maintain sequential compression device Start: 10/14/23 0404 Place TED hose Start: 10/14/23 0404 SCDs Start: 10/11/23 1620     Code Status: Full Code:  Family Communication: None at bedside Disposition:  Status is: Inpatient Remains inpatient  appropriate because: Awaiting evaluation of malfunctioning AV graft.  Will not discharge today.     Consultants:   Nephrology Infectious disease  Procedures:   Hemodialysis TEE  Antimicrobials:   As noted above   Subjective:  Reports 3 episodes of nonbloody emesis yesterday.  6 episodes of loose/soft BM in the last 24 hours and the last one at around 5 AM this morning but decreasing overnight.  Seen eating breakfast this morning.  Had some abdominal discomfort yesterday but none today.  No overt bleeding or melena reported.  As per renal team, unable to complete dialysis today due to malfunctioning AV graft.  Objective:   Vitals:   10/19/23 0900 10/19/23 0950 10/19/23 1205 10/19/23 1300  BP: 130/77 125/73 138/78 130/76  Pulse: 89 86  84  Resp: 16 17 19 18   Temp: 98.2 F (36.8 C) 98.1 F (36.7 C) 98.5 F (36.9 C) 98.9 F (37.2 C)  TempSrc: Oral Oral Oral Oral  SpO2:      Weight:      Height:        General exam: Young female, moderately built and nourished sitting comfortably at edge of bed eating breakfast this morning. Respiratory system: Clear to auscultation.  No increased work of breathing. Cardiovascular system: S1 & S2 heard, RRR. No JVD, murmurs, rubs, gallops or clicks. No pedal edema.  Telemetry personally reviewed: Sinus rhythm. Gastrointestinal system: Abdomen is nondistended, soft and nontender. No organomegaly or masses felt. Normal bowel sounds heard. Central nervous system: Alert and oriented. No focal neurological deficits. Extremities: Symmetric 5 x 5 power.  Left upper arm AVG dressing clean and dry. Skin: No rashes, lesions or ulcers Psychiatry: Judgement and insight appear normal. Mood & affect appropriate.     Data Reviewed:   I have personally reviewed following labs and imaging studies   CBC: Recent Labs  Lab 10/17/23 0401 10/18/23 0251 10/19/23 0313  WBC 8.8 8.7 7.1  NEUTROABS 5.4 5.2 4.6  HGB 7.4* 7.4* 6.8*  HCT 23.7* 23.4* 21.4*   MCV 96.3 95.1 96.0  PLT 260 246 245    Basic Metabolic Panel: Recent Labs  Lab 10/15/23 0253 10/16/23 0325 10/17/23 0401 10/18/23 0251 10/19/23 0313  NA 138 140 141 139 139  K 3.9 4.3 4.5 3.9 4.1  CL 106 107 107 103 105  CO2 24 25 22 26 25   GLUCOSE 93 98 151* 140* 162*  BUN 27* 42* 49* 28* 35*  CREATININE 4.05* 5.13* 5.97* 4.03* 4.99*  CALCIUM 6.6* 6.5* 6.6* 6.7* 6.2*  PHOS 3.6 5.0* 5.8* 5.0* 6.0*    Liver Function Tests: Recent Labs  Lab 10/15/23 0253 10/16/23 0325 10/17/23 0401 10/18/23 0251 10/19/23 0313  ALBUMIN 2.1* 2.1* 2.4* 2.2* 2.2*    CBG: Recent Labs  Lab 10/12/23 2139  GLUCAP 199*    Microbiology Studies:   Recent Results (from the past 240 hours)  Resp panel by RT-PCR (RSV, Flu A&B, Covid) Anterior Nasal Swab     Status: None   Collection Time: 10/11/23  8:01 AM   Specimen: Anterior Nasal Swab  Result Value Ref Range Status   SARS Coronavirus 2 by RT PCR NEGATIVE NEGATIVE Final    Comment: (NOTE) SARS-CoV-2 target nucleic acids are NOT DETECTED.  The SARS-CoV-2 RNA is generally detectable in upper respiratory specimens during the acute phase of infection. The lowest concentration of SARS-CoV-2 viral copies this assay can detect is 138 copies/mL. A negative result does not preclude SARS-Cov-2 infection  and should not be used as the sole basis for treatment or other patient management decisions. A negative result may occur with  improper specimen collection/handling, submission of specimen other than nasopharyngeal swab, presence of viral mutation(s) within the areas targeted by this assay, and inadequate number of viral copies(<138 copies/mL). A negative result must be combined with clinical observations, patient history, and epidemiological information. The expected result is Negative.  Fact Sheet for Patients:  BloggerCourse.com  Fact Sheet for Healthcare Providers:   SeriousBroker.it  This test is no t yet approved or cleared by the [place] FDA and  has been authorized for detection and/or diagnosis of SARS-CoV-2 by FDA under an Emergency Use Authorization (EUA). This EUA will remain  in effect (meaning this test can be used) for the duration of the COVID-19 declaration under Section 564(b)(1) of the Act, 21 U.S.C.section 360bbb-3(b)(1), unless the authorization is terminated  or revoked sooner.       Influenza A by PCR NEGATIVE NEGATIVE Final   Influenza B by PCR NEGATIVE NEGATIVE Final    Comment: (NOTE) The Xpert Xpress SARS-CoV-2/FLU/RSV plus assay is intended as an aid in the diagnosis of influenza from Nasopharyngeal swab specimens and should not be used as a sole basis for treatment. Nasal washings and aspirates are unacceptable for Xpert Xpress SARS-CoV-2/FLU/RSV testing.  Fact Sheet for Patients: BloggerCourse.com  Fact Sheet for Healthcare Providers: SeriousBroker.it  This test is not yet approved or cleared by the [place] FDA and has been authorized for detection and/or diagnosis of SARS-CoV-2 by FDA under an Emergency Use Authorization (EUA). This EUA will remain in effect (meaning this test can be used) for the duration of the COVID-19 declaration under Section 564(b)(1) of the Act, 21 U.S.C. section 360bbb-3(b)(1), unless the authorization is terminated or revoked.     Resp Syncytial Virus by PCR NEGATIVE NEGATIVE Final    Comment: (NOTE) Fact Sheet for Patients: BloggerCourse.com  Fact Sheet for Healthcare Providers: SeriousBroker.it  This test is not yet approved or cleared by the [place] FDA and has been authorized for detection and/or diagnosis of SARS-CoV-2 by FDA under an Emergency Use Authorization (EUA). This EUA will remain in effect (meaning this test can be used) for  the duration of the COVID-19 declaration under Section 564(b)(1) of the Act, 21 U.S.C. section 360bbb-3(b)(1), unless the authorization is terminated or revoked.  Performed at [facility], [address] [place], [place] [address]   Culture, [place] Urine     Status: Abnormal   Collection Time: 10/13/23  5:19 PM   Specimen: Urine, Random  Result Value Ref Range Status   Specimen Description URINE, RANDOM  Final   Special Requests NONE  Final   Culture (A)  Final    <10,000 COLONIES/mL INSIGNIFICANT GROWTH NO GROUP B STREP (S.AGALACTIAE) ISOLATED Performed at [facility], [address] [place], [place] [address]    Report Status 10/15/2023 FINAL  Final  Culture, blood (Routine X 2) w Reflex to ID Panel     Status: Abnormal   Collection Time: 10/13/23  6:53 PM   Specimen: BLOOD  Result Value Ref Range Status   Specimen Description BLOOD SITE NOT SPECIFIED  Final   Special Requests   Final    BOTTLES DRAWN AEROBIC AND ANAEROBIC Blood Culture results may not be optimal due to an inadequate volume of blood received in culture bottles   Culture  Setup Time   Final    GRAM POSITIVE COCCI  IN CHAINS IN BOTH AEROBIC AND ANAEROBIC BOTTLES CRITICAL RESULT CALLED TO, READ BACK BY AND VERIFIED WITH: PHARMD CAREN AMEND [id number] BY J RAZZAK,MT Performed at [facility], [address], [place], [place] [address]    Culture STREPTOCOCCUS MUTANS (A)  Final   Report Status 10/16/2023 FINAL  Final   Organism ID, Bacteria STREPTOCOCCUS MUTANS  Final      Susceptibility   Streptococcus mutans - MIC*    PENICILLIN <=0.06 SENSITIVE Sensitive     CEFTRIAXONE <=0.12 SENSITIVE Sensitive     ERYTHROMYCIN <=0.12 SENSITIVE Sensitive     LEVOFLOXACIN 2 SENSITIVE Sensitive     VANCOMYCIN 1 SENSITIVE Sensitive     * STREPTOCOCCUS MUTANS  Blood Culture ID Panel (Reflexed)     Status: Abnormal   Collection Time: 10/13/23  6:53 PM  Result Value Ref Range Status   Enterococcus  faecalis NOT DETECTED NOT DETECTED Final   Enterococcus Faecium NOT DETECTED NOT DETECTED Final   Listeria monocytogenes NOT DETECTED NOT DETECTED Final   Staphylococcus species NOT DETECTED NOT DETECTED Final   Staphylococcus aureus (BCID) NOT DETECTED NOT DETECTED Final   Staphylococcus epidermidis NOT DETECTED NOT DETECTED Final   Staphylococcus lugdunensis NOT DETECTED NOT DETECTED Final   Streptococcus species DETECTED (A) NOT DETECTED Final    Comment: Not Enterococcus species, Streptococcus agalactiae, Streptococcus pyogenes, or Streptococcus pneumoniae. CRITICAL RESULT CALLED TO, READ BACK BY AND VERIFIED WITH: PHARMD CAREN AMEND [id number] BY J RAZZAK, MT    Streptococcus agalactiae NOT DETECTED NOT DETECTED Final   Streptococcus pneumoniae NOT DETECTED NOT DETECTED Final   Streptococcus pyogenes NOT DETECTED NOT DETECTED Final   A.calcoaceticus-baumannii NOT DETECTED NOT DETECTED Final   Bacteroides fragilis NOT DETECTED NOT DETECTED Final   Enterobacterales NOT DETECTED NOT DETECTED Final   Enterobacter cloacae complex NOT DETECTED NOT DETECTED Final   Escherichia coli NOT DETECTED NOT DETECTED Final   Klebsiella aerogenes NOT DETECTED NOT DETECTED Final   Klebsiella oxytoca NOT DETECTED NOT DETECTED Final   Klebsiella pneumoniae NOT DETECTED NOT DETECTED Final   Proteus species NOT DETECTED NOT DETECTED Final   Salmonella species NOT DETECTED NOT DETECTED Final   Serratia marcescens NOT DETECTED NOT DETECTED Final   Haemophilus influenzae NOT DETECTED NOT DETECTED Final   Neisseria meningitidis NOT DETECTED NOT DETECTED Final   Pseudomonas aeruginosa NOT DETECTED NOT DETECTED Final   Stenotrophomonas maltophilia NOT DETECTED NOT DETECTED Final   Candida albicans NOT DETECTED NOT DETECTED Final   Candida auris NOT DETECTED NOT DETECTED Final   Candida glabrata NOT DETECTED NOT DETECTED Final   Candida krusei NOT DETECTED NOT DETECTED Final   Candida parapsilosis NOT  DETECTED NOT DETECTED Final   Candida tropicalis NOT DETECTED NOT DETECTED Final   Cryptococcus neoformans/gattii NOT DETECTED NOT DETECTED Final    Comment: Performed at [facility], [address] [place], [place] [address]  Culture, blood (Routine X 2) w Reflex to ID Panel     Status: Abnormal   Collection Time: 10/13/23  7:02 PM   Specimen: BLOOD  Result Value Ref Range Status   Specimen Description BLOOD SITE NOT SPECIFIED  Final   Special Requests   Final    BOTTLES DRAWN AEROBIC ONLY Blood Culture results may not be optimal due to an inadequate volume of blood received in culture bottles   Culture  Setup Time   Final    GRAM POSITIVE COCCI IN CHAINS AEROBIC BOTTLE ONLY CRITICAL VALUE NOTED.  VALUE IS CONSISTENT WITH PREVIOUSLY REPORTED AND CALLED VALUE.    Culture (A)  Final    STREPTOCOCCUS MUTANS SUSCEPTIBILITIES PERFORMED ON PREVIOUS CULTURE WITHIN THE LAST 5 DAYS. Performed at [facility], [address], [place], [place] [address]    Report Status 10/16/2023 FINAL  Final  Culture, blood (Routine X 2) w Reflex to ID Panel     Status: None (Preliminary result)   Collection Time: 10/15/23  2:21 PM   Specimen: BLOOD RIGHT ARM  Result Value Ref Range Status   Specimen Description BLOOD RIGHT ARM  Final   Special Requests   Final    BOTTLES DRAWN AEROBIC AND ANAEROBIC Blood Culture results may not be optimal due to an inadequate volume of blood received in culture bottles   Culture   Final    NO GROWTH 4 DAYS Performed at [facility], [address] [place], [place] [address]    Report Status PENDING  Incomplete  Culture, blood (Routine X 2) w Reflex to ID Panel     Status: None (Preliminary result)   Collection Time: 10/15/23  2:21 PM   Specimen: BLOOD RIGHT HAND  Result Value Ref Range Status   Specimen Description BLOOD RIGHT HAND  Final   Special Requests   Final    BOTTLES DRAWN AEROBIC AND ANAEROBIC Blood Culture adequate volume   Culture    Final    NO GROWTH 4 DAYS Performed at [facility], [address], [place], [place] [address]    Report Status PENDING  Incomplete    Radiology Studies:  MR THORACIC SPINE WO CONTRAST Result Date: 10/18/2023 CLINICAL DATA:  43 year old female with neck pain and Streptococcal bacteremia. EXAM: MRI THORACIC SPINE WITHOUT CONTRAST TECHNIQUE: Multiplanar, multisequence MR imaging of the thoracic spine was performed. No intravenous contrast was administered. COMPARISON:  Cervical spine MRI today reported separately. Chest CTA 10/01/2019, 05/23/2017. CT Abdomen and Pelvis 05/09/2023. FINDINGS: Limited cervical spine imaging:  Reported separately today. Thoracic spine segmentation:  Appears normal on the 2018 CTA. Alignment: Stable thoracic kyphosis since 2021, mildly exaggerated in the upper thoracic spine. Subtle levoconvex thoracic scoliosis. Vertebrae: Chronic flowing endplate osteophytes throughout the thoracic spine from the T2 level through T11 on the 2021 CTA, resulting in interbody ankylosis of those levels at that time. Some associated costovertebral ankylosis also. On the CT Abdomen and Pelvis last year lower thoracic interbody ankylosis appeared solid through the T12 level (with presumed transitional lumbosacral anatomy). Maintained thoracic vertebral height. Background bone marrow signal within normal limits. No thoracic marrow edema is identified. However, the L1 superior endplate is abnormal. The superior endplate appears compressed asymmetrically to the right (new compared to 05/09/2023) with estimated 25% loss of vertebral body height. Associated T2 and STIR hyperintensity at the superior endplate there (series 11, image 10). No retropulsion. But the adjacent T12-L1 disc appears relatively unremarkable, similar to other levels. And there is no paraspinal inflammation identified there. Cord: Normal; again CSF pulsation artifact is suspected on sagittal images. Spinal canal appears  unremarkable on series 14 axial images. Conus medullaris appears normal at T12-L1. Paraspinal and other soft tissues: Small layering pleural effusions are visible. Negative visible upper abdominal viscera. Thoracic paraspinal soft tissues remain within normal limits. Disc levels: Subtotal or total thoracic interbody ankylosis suspected as detailed above. No thoracic spinal stenosis. IMPRESSION: 1. Hyperostosis related thoracic spinal ankylosis. No evidence of acute or inflammatory process in the thoracic spine on this noncontrast exam. 2. However, compressed  L1 superior endplate, new since 05/09/2023 with up to 25% loss of height. Mild associated marrow edema, but no other features of spinal infection there at this time. No retropulsion or other complicating features. 3. Small layering pleural effusions. Electronically Signed   By: Odessa Fleming M.D.   On: 10/18/2023 12:28   MR CERVICAL SPINE WO CONTRAST Result Date: 10/18/2023 CLINICAL DATA:  43 year old female with neck pain and Streptococcal bacteremia. EXAM: MRI CERVICAL SPINE WITHOUT CONTRAST TECHNIQUE: Multiplanar, multisequence MR imaging of the cervical spine was performed. No intravenous contrast was administered. COMPARISON:  Brain MRI 12/02/2019. FINDINGS: Alignment: Relatively normal cervical lordosis. Vertebrae: Visualized bone marrow signal is within normal limits. No marrow edema or evidence of acute osseous abnormality. Cord: Maintained normal spinal cord signal and morphology. Heterogeneity within the spinal canal, particularly on sagittal STIR imaging (series 5, image 12) appears to be CSF pulsation artifact. No convincing intraspinal fluid collection by axial GRE or sagittal T1 imaging. Posterior Fossa, vertebral arteries, paraspinal tissues: Cervicomedullary junction is within normal limits. Negative visible posterior fossa. Preserved major vascular flow voids in the neck. Negative visible neck soft tissues, right lung apex. There is no prevertebral  fluid or edema. Disc levels: Negative for age. No cervical spinal or significant foraminal stenosis. Mild lower cervical ligament flavum hypertrophy. IMPRESSION: Negative noncontrast MRI appearance of the Cervical spine. Electronically Signed   By: Odessa Fleming M.D.   On: 10/18/2023 12:17   DG Orthopantogram Result Date: 10/17/2023 CLINICAL DATA:  Struck to coccal bacteremia EXAM: ORTHOPANTOGRAM/PANORAMIC COMPARISON:  None Available. FINDINGS: Panorex view of the mandible was performed. There are no acute bony abnormalities. No evidence of dental erosions or caries. Multiple prior teeth extractions. Visualized paranasal sinuses are clear. IMPRESSION: 1. Multiple prior teeth extractions. No evidence of acute dental disease. Electronically Signed   By: Sharlet Salina M.D.   On: 10/17/2023 20:33    Scheduled Meds:    sodium chloride   Intravenous Once   sodium chloride   Intravenous Once   calcitRIOL  0.5 mcg Oral TID   calcium carbonate  1 tablet Oral BID BM   Chlorhexidine Gluconate Cloth  6 each Topical Q0600   darbepoetin (ARANESP) injection - NON-DIALYSIS  100 mcg Subcutaneous Q Fri-1800   gabapentin  300 mg Oral QHS   metoprolol tartrate  25 mg Oral BID    Continuous Infusions:     ceFAZolin (ANCEF) IV 200 mL/hr at 10/17/23 1551   promethazine (PHENERGAN) injection (IM or IVPB) 150 mL/hr at 10/16/23 0340     LOS: 8 days     Marcellus Scott, MD,  FACP, [facility], [facility], [facility]   [facility] [place]      To contact the attending provider between 7A-7P or the covering provider during after hours 7P-7A, please log into the web site [email or web address] and access using universal [place] password for that web site. If you do not have the password, please call the hospital operator.  10/19/2023, 2:29 PM

## 2023-10-19 NOTE — Progress Notes (Signed)
 Pt. Jacqueline [name] is not working appropriately. Arterial Line pulling Blood Clots X2. Notified MD Glenna Fellows and Floor Bedside RN with the results. Pt is returned to 3E.

## 2023-10-19 NOTE — Plan of Care (Signed)
  Problem: Clinical Measurements: Goal: Respiratory complications will improve Outcome: Progressing   Problem: Pain Managment: Goal: General experience of comfort will improve and/or be controlled Outcome: Progressing

## 2023-10-19 NOTE — Procedures (Signed)
  Procedure:  Fistulagram, venous PTA   Preprocedure diagnosis: The encounter diagnosis was Hypocalcemia. Postprocedure diagnosis: same EBL:    minimal Complications:   none immediate  See full dictation in [employer].  Thora Lance MD Main # [phone] Pager  [phone] Mobile [phone]

## 2023-10-19 NOTE — Progress Notes (Signed)
 Jacqueline [name] PROGRESS NOTE  Assessment/ Plan: Pt is a 43 y.o. yo female  with medical hx of HTN, HLD, DMII, CKD V, HFpEF, CKD s/p LUE AVG as OP (Dr. Ronalee Belts at [facility]), with new ESRD.    # Acute febrile illness due to UTI and bacteremia:  Blood culture with streptococcal mutans.  Currently on Cefazolin 2g/HD-TTS with end date of 4/12 .  Primary team has consulted ID - repeat cx NGTD.  TEE neg. Dental scan fine.  MRI neck/back showing some nonspecific changes - ID will review.    # CKD stage V with progression to new ESRD - First HD on 3/26 via her AVG (placed 04/2023 dr. Hetty Blend) -Continue dialysis TTS schedule --> was unable to dialyze today due to pulling clots; exam shows patent AVG but difficulties using last tx and unable to this tx --> vasc US and IR consult - Pt has been accepted at [facility] on TTS 12:00 chair time. Pt can start anytime   # Hypertension: acceptable now off meds since starting HD   # Heart failure with preserved EF  -euvolemic on exam   # Anemia of CKD: - 3/26 iron sat 23%  aranesp 100 mcg every week, monitor lab - Hb < 7, transfusion planned for today.  # Secondary hyperparathyroidism, Hypocalcemia: Start PO ca.  Intact PTH 415. Calcitriol 0.5 mcg TID.  Hold pending resolution of AVF issue. Messaged primary MD.   Subjective: Seen and examined.  Was unable to complete HD today - venous needle pulling clots; several experienced staff tried.  She's otherwise ok.   Objective Vital signs in last 24 hours: Vitals:   10/19/23 0507 10/19/23 0718 10/19/23 0756 10/19/23 0801  BP: 119/73  132/73 134/79  Pulse: 81  83 89  Resp: 18 18 20 13   Temp: 98 F (36.7 C)  97.8 F (36.6 C) 98.3 F (36.8 C)  TempSrc: Oral  Oral   SpO2: 97%   100%  Weight: 81.2 kg   83 kg  Height:       Weight change: -3.306 kg  Intake/Output Summary (Last 24 hours) at 10/19/2023 0859 Last data filed at 10/18/2023 1322 Gross per 24 hour  Intake 240 ml  Output --  Net  240 ml       Labs: RENAL PANEL Recent Labs  Lab 10/15/23 0253 10/16/23 0325 10/17/23 0401 10/18/23 0251 10/19/23 0313  NA 138 140 141 139 139  K 3.9 4.3 4.5 3.9 4.1  CL 106 107 107 103 105  CO2 24 25 22 26 25   GLUCOSE 93 98 151* 140* 162*  BUN 27* 42* 49* 28* 35*  CREATININE 4.05* 5.13* 5.97* 4.03* 4.99*  CALCIUM 6.6* 6.5* 6.6* 6.7* 6.2*  PHOS 3.6 5.0* 5.8* 5.0* 6.0*  ALBUMIN 2.1* 2.1* 2.4* 2.2* 2.2*    Liver Function Tests: Recent Labs  Lab 10/17/23 0401 10/18/23 0251 10/19/23 0313  ALBUMIN 2.4* 2.2* 2.2*   No results for input(s): "LIPASE", "AMYLASE" in the last 168 hours. No results for input(s): "AMMONIA" in the last 168 hours. CBC: Recent Labs    07/21/23 0925 10/11/23 0920 10/11/23 1930 10/12/23 0319 10/15/23 1009 10/16/23 0325 10/17/23 0401 10/18/23 0251 10/19/23 0313  HGB  --    < >  --    < > 7.9* 7.3* 7.4* 7.4* 6.8*  MCV  --    < >  --    < > 94.6 94.2 96.3 95.1 96.0  FERRITIN 323*  --  187  --   --   --   --   --   --  TIBC 210*  --  176*  --   --   --   --   --   --   IRON 72  --  41  --   --   --   --   --   --    < > = values in this interval not displayed.    Cardiac Enzymes: No results for input(s): "CKTOTAL", "CKMB", "CKMBINDEX", "TROPONINI" in the last 168 hours. CBG: Recent Labs  Lab 10/12/23 2139  GLUCAP 199*    Iron Studies:  No results for input(s): "IRON", "TIBC", "TRANSFERRIN", "FERRITIN" in the last 72 hours.  Studies/Results: MR THORACIC SPINE WO CONTRAST Result Date: 10/18/2023 CLINICAL DATA:  43 year old female with neck pain and Streptococcal bacteremia. EXAM: MRI THORACIC SPINE WITHOUT CONTRAST TECHNIQUE: Multiplanar, multisequence MR imaging of the thoracic spine was performed. No intravenous contrast was administered. COMPARISON:  Cervical spine MRI today reported separately. Chest CTA 10/01/2019, 05/23/2017. CT Abdomen and Pelvis 05/09/2023. FINDINGS: Limited cervical spine imaging:  Reported separately today.  Thoracic spine segmentation:  Appears normal on the 2018 CTA. Alignment: Stable thoracic kyphosis since 2021, mildly exaggerated in the upper thoracic spine. Subtle levoconvex thoracic scoliosis. Vertebrae: Chronic flowing endplate osteophytes throughout the thoracic spine from the T2 level through T11 on the 2021 CTA, resulting in interbody ankylosis of those levels at that time. Some associated costovertebral ankylosis also. On the CT Abdomen and Pelvis last year lower thoracic interbody ankylosis appeared solid through the T12 level (with presumed transitional lumbosacral anatomy). Maintained thoracic vertebral height. Background bone marrow signal within normal limits. No thoracic marrow edema is identified. However, the L1 superior endplate is abnormal. The superior endplate appears compressed asymmetrically to the right (new compared to 05/09/2023) with estimated 25% loss of vertebral body height. Associated T2 and STIR hyperintensity at the superior endplate there (series 11, image 10). No retropulsion. But the adjacent T12-L1 disc appears relatively unremarkable, similar to other levels. And there is no paraspinal inflammation identified there. Cord: Normal; again CSF pulsation artifact is suspected on sagittal images. Spinal canal appears unremarkable on series 14 axial images. Conus medullaris appears normal at T12-L1. Paraspinal and other soft tissues: Small layering pleural effusions are visible. Negative visible upper abdominal viscera. Thoracic paraspinal soft tissues remain within normal limits. Disc levels: Subtotal or total thoracic interbody ankylosis suspected as detailed above. No thoracic spinal stenosis. IMPRESSION: 1. Hyperostosis related thoracic spinal ankylosis. No evidence of acute or inflammatory process in the thoracic spine on this noncontrast exam. 2. However, compressed L1 superior endplate, new since 05/09/2023 with up to 25% loss of height. Mild associated marrow edema, but no other  features of spinal infection there at this time. No retropulsion or other complicating features. 3. Small layering pleural effusions. Electronically Signed   By: Odessa Fleming M.D.   On: 10/18/2023 12:28   MR CERVICAL SPINE WO CONTRAST Result Date: 10/18/2023 CLINICAL DATA:  43 year old female with neck pain and Streptococcal bacteremia. EXAM: MRI CERVICAL SPINE WITHOUT CONTRAST TECHNIQUE: Multiplanar, multisequence MR imaging of the cervical spine was performed. No intravenous contrast was administered. COMPARISON:  Brain MRI 12/02/2019. FINDINGS: Alignment: Relatively normal cervical lordosis. Vertebrae: Visualized bone marrow signal is within normal limits. No marrow edema or evidence of acute osseous abnormality. Cord: Maintained normal spinal cord signal and morphology. Heterogeneity within the spinal canal, particularly on sagittal STIR imaging (series 5, image 12) appears to be CSF pulsation artifact. No convincing intraspinal fluid collection by axial GRE or sagittal T1 imaging. Posterior  Fossa, vertebral arteries, paraspinal tissues: Cervicomedullary junction is within normal limits. Negative visible posterior fossa. Preserved major vascular flow voids in the neck. Negative visible neck soft tissues, right lung apex. There is no prevertebral fluid or edema. Disc levels: Negative for age. No cervical spinal or significant foraminal stenosis. Mild lower cervical ligament flavum hypertrophy. IMPRESSION: Negative noncontrast MRI appearance of the Cervical spine. Electronically Signed   By: Odessa Fleming M.D.   On: 10/18/2023 12:17   DG Orthopantogram Result Date: 10/17/2023 CLINICAL DATA:  Struck to coccal bacteremia EXAM: ORTHOPANTOGRAM/PANORAMIC COMPARISON:  None Available. FINDINGS: Panorex view of the mandible was performed. There are no acute bony abnormalities. No evidence of dental erosions or caries. Multiple prior teeth extractions. Visualized paranasal sinuses are clear. IMPRESSION: 1. Multiple prior teeth  extractions. No evidence of acute dental disease. Electronically Signed   By: Sharlet Salina M.D.   On: 10/17/2023 20:33     Medications: Infusions:   ceFAZolin (ANCEF) IV 200 mL/hr at 10/17/23 1551   promethazine (PHENERGAN) injection (IM or IVPB) 150 mL/hr at 10/16/23 0340    Scheduled Medications:  sodium chloride   Intravenous Once   sodium chloride   Intravenous Once   calcitRIOL  0.5 mcg Oral TID   Chlorhexidine Gluconate Cloth  6 each Topical Q0600   darbepoetin (ARANESP) injection - NON-DIALYSIS  100 mcg Subcutaneous Q Fri-1800   gabapentin  300 mg Oral QHS   metoprolol tartrate  25 mg Oral BID    have reviewed scheduled and prn medications.  Physical Exam: General:NAD, comfortable Heart:RRR, s1s2 nl Lungs:clear b/l, no crackle Abdomen:soft, Non-tender, non-distended Extremities:No edema Dialysis Access: AV graft with good thrill and bruit. No erythema, TTP, fluctuance.   Jacqueline [name] 10/19/2023,8:59 AM  LOS: 8 days

## 2023-10-20 DIAGNOSIS — Z992 Dependence on renal dialysis: Secondary | ICD-10-CM | POA: Diagnosis not present

## 2023-10-20 DIAGNOSIS — I5030 Unspecified diastolic (congestive) heart failure: Secondary | ICD-10-CM | POA: Diagnosis not present

## 2023-10-20 DIAGNOSIS — D631 Anemia in chronic kidney disease: Secondary | ICD-10-CM | POA: Diagnosis not present

## 2023-10-20 DIAGNOSIS — N189 Chronic kidney disease, unspecified: Secondary | ICD-10-CM | POA: Diagnosis not present

## 2023-10-20 DIAGNOSIS — E872 Acidosis, unspecified: Secondary | ICD-10-CM | POA: Diagnosis not present

## 2023-10-20 DIAGNOSIS — I132 Hypertensive heart and chronic kidney disease with heart failure and with stage 5 chronic kidney disease, or end stage renal disease: Secondary | ICD-10-CM | POA: Diagnosis not present

## 2023-10-20 DIAGNOSIS — N186 End stage renal disease: Secondary | ICD-10-CM | POA: Diagnosis not present

## 2023-10-20 LAB — RENAL FUNCTION PANEL
Albumin: 2.4 g/dL — ABNORMAL LOW (ref 3.5–5.0)
Anion gap: 10 (ref 5–15)
BUN: 20 mg/dL (ref 6–20)
CO2: 25 mmol/L (ref 22–32)
Calcium: 7 mg/dL — ABNORMAL LOW (ref 8.9–10.3)
Chloride: 103 mmol/L (ref 98–111)
Creatinine, Ser: 2.78 mg/dL — ABNORMAL HIGH (ref 0.44–1.00)
GFR, Estimated: 21 mL/min — ABNORMAL LOW (ref >60)
Glucose, Bld: 170 mg/dL — ABNORMAL HIGH (ref 70–99)
Phosphorus: 3.3 mg/dL (ref 2.5–4.6)
Potassium: 3.6 mmol/L (ref 3.5–5.1)
Sodium: 138 mmol/L (ref 135–145)

## 2023-10-20 LAB — TYPE AND SCREEN
ABO/RH(D): B POS
Antibody Screen: NEGATIVE
Unit division: 0

## 2023-10-20 LAB — CULTURE, BLOOD (ROUTINE X 2)
Culture: NO GROWTH
Culture: NO GROWTH
Special Requests: ADEQUATE

## 2023-10-20 LAB — BPAM RBC
Blood Product Expiration Date: 202504272359
ISSUE DATE / TIME: 202504030926
Unit Type and Rh: 7300

## 2023-10-20 MED ORDER — CEFAZOLIN SODIUM-DEXTROSE 1-4 GM/50ML-% IV SOLN
1.0000 g | INTRAVENOUS | Status: DC
Start: 2023-10-20 — End: 2023-10-20
  Filled 2023-10-20: qty 50

## 2023-10-20 MED ORDER — METOPROLOL TARTRATE 25 MG PO TABS: 25.0000 mg | ORAL_TABLET | Freq: Two times a day (BID) | ORAL | 0 refills | Status: DC

## 2023-10-20 NOTE — H&P (View-Only) (Signed)
 [facility] NEPHROLOGY PROGRESS NOTE  Assessment/ Plan: Pt is a 43 y.o. yo female  with medical hx of HTN, HLD, DMII, CKD V, HFpEF, CKD s/p LUE AVG as OP (Dr. Ronalee Belts at [facility]), with new ESRD.    # Acute febrile illness due to UTI and bacteremia:  Blood culture with streptococcal mutans.  Currently on Cefazolin 2g/HD-TTS with end date of 4/12 .  Primary team has consulted ID - repeat cx NGTD.  TEE neg. Dental scan fine.  MRI neck/back showing some nonspecific changes - ID will review.    # CKD stage V with progression to new ESRD - First HD on 3/26 via her AVG (placed 04/2023 dr. Hetty Blend) -Continue dialysis TTS schedule --> was unable to dialyze today due to pulling clots; exam shows patent AVG but difficulties using last tx and unable to this tx --> vasc US and IR consult - Pt has been accepted at [facility] on TTS 12:00 chair time. Pt can start anytime   # Hypertension: acceptable now off meds since starting HD   # Heart failure with preserved EF  -euvolemic on exam   # Anemia of CKD: - 3/26 iron sat 23%  aranesp 100 mcg every week, monitor lab - Hb < 7, 1u pRBC given 4/3 Hb 6.8 > 9.1  # Secondary hyperparathyroidism, Hypocalcemia: Cont  PO ca.  Intact PTH 415. Calcitriol 0.5 mcg TID.  D/c today.   Subjective: Seen and examined.  Had graft angioplasty 7mm balloon yesterday followed by overnight HD.  Transfused yesterday, no bleeding.  Anxious for d/c today.  Objective Vital signs in last 24 hours: Vitals:   10/20/23 0228 10/20/23 0247 10/20/23 0300 10/20/23 0848  BP:  139/82 139/82 (!) 153/85  Pulse: 85 88 88   Resp: 17 18  18   Temp:  97.9 F (36.6 C)  98.2 F (36.8 C)  TempSrc:  Oral  Oral  SpO2: 98% 99%  99%  Weight:      Height:       Weight change: 1.806 kg  Intake/Output Summary (Last 24 hours) at 10/20/2023 0915 Last data filed at 10/20/2023 0216 Gross per 24 hour  Intake 650 ml  Output 500 ml  Net 150 ml       Labs: RENAL PANEL Recent Labs  Lab  10/16/23 0325 10/17/23 0401 10/18/23 0251 10/19/23 0313 10/20/23 0308  NA 140 141 139 139 138  K 4.3 4.5 3.9 4.1 3.6  CL 107 107 103 105 103  CO2 25 22 26 25 25   GLUCOSE 98 151* 140* 162* 170*  BUN 42* 49* 28* 35* 20  CREATININE 5.13* 5.97* 4.03* 4.99* 2.78*  CALCIUM 6.5* 6.6* 6.7* 6.2* 7.0*  PHOS 5.0* 5.8* 5.0* 6.0* 3.3  ALBUMIN 2.1* 2.4* 2.2* 2.2* 2.4*    Liver Function Tests: Recent Labs  Lab 10/18/23 0251 10/19/23 0313 10/20/23 0308  ALBUMIN 2.2* 2.2* 2.4*   No results for input(s): "LIPASE", "AMYLASE" in the last 168 hours. No results for input(s): "AMMONIA" in the last 168 hours. CBC: Recent Labs    07/21/23 0925 10/11/23 0920 10/11/23 1930 10/12/23 0319 10/16/23 0325 10/17/23 0401 10/18/23 0251 10/19/23 0313 10/20/23 0308  HGB  --    < >  --    < > 7.3* 7.4* 7.4* 6.8* 9.1*  MCV  --    < >  --    < > 94.2 96.3 95.1 96.0 92.9  FERRITIN 323*  --  187  --   --   --   --   --   --  TIBC 210*  --  176*  --   --   --   --   --   --   IRON 72  --  41  --   --   --   --   --   --    < > = values in this interval not displayed.    Cardiac Enzymes: No results for input(s): "CKTOTAL", "CKMB", "CKMBINDEX", "TROPONINI" in the last 168 hours. CBG: No results for input(s): "GLUCAP" in the last 168 hours.   Iron Studies:  No results for input(s): "IRON", "TIBC", "TRANSFERRIN", "FERRITIN" in the last 72 hours.  Studies/Results: IR AV DIALY SHUNT INTRO NEEDLE/INTRACATH INITIAL W/PTA/IMG LEFT Result Date: 10/19/2023 CLINICAL DATA:  End stage renal disease, pulling clots during dialysis EXAM: DIALYSIS SHUNTOGRAM VENOUS ANGIOPLASTY FLUOROSCOPY: Radiation Exposure Index (as provided by the fluoroscopic device): 15.5 mGy air Kerma COMPARISON:  None Available. TECHNIQUE: An 18-gauge angiocatheter was placed antegrade into the peripheral aspect of the patient's left arm synthetic AV hemodialysis fistula for dialysis fistulography. The angiocatheter and surrounding skin were  then prepped with Betadine, draped in usual sterile fashion, infiltrated locally with 1% lidocaine. Intravenous Fentanyl and Versed 1.5mg  were administered by RN during a total moderate (conscious) sedation time of 12 minutes; the patient's level of consciousness and physiological / cardiorespiratory status were monitored continuously by radiology RN under my direct supervision. The angiocatheter was exchanged over a Benson wire for a 6 [place] vascular sheath, through which a 7 mm x 4 cm Conquest angioplasty balloon was advanced to the level of a moderately severe venous anastomotic stenosis for venous angioplasty using 60 second inflation at 30 atmospheres. After follow-up venography, the catheter, sheath, and guidewire were removed and hemostasis achieved with a 2-0 Ethilon purse-string suture. No immediate complication. FINDINGS: Arterial anastomosis widely patent. Left upper arm synthetic straight graft patent. Moderate short-segment stenosis just central to the the venous anastomosis. Some retrograde flow into native venous collateral outflow channels. The more central venous outflow  through the SVC is widely patent. Venous anastomotic stenosis responded ultimately to 7 mm balloon angioplasty. Follow-up shuntogram shows resolution of stenosis, excellent flow through the site, no dissection or extravasation, no further filling of native venous collateral pathways. IMPRESSION: 1. Hemodynamically significant venous outflow stenosis, with excellent angiographic response to 7 mm balloon angioplasty. ACCESS: Remains approachable for percutaneous intervention as needed. Electronically Signed   By: Corlis Leak M.D.   On: 10/19/2023 20:13   MR THORACIC SPINE WO CONTRAST Result Date: 10/18/2023 CLINICAL DATA:  43 year old female with neck pain and Streptococcal bacteremia. EXAM: MRI THORACIC SPINE WITHOUT CONTRAST TECHNIQUE: Multiplanar, multisequence MR imaging of the thoracic spine was performed. No intravenous  contrast was administered. COMPARISON:  Cervical spine MRI today reported separately. Chest CTA 10/01/2019, 05/23/2017. CT Abdomen and Pelvis 05/09/2023. FINDINGS: Limited cervical spine imaging:  Reported separately today. Thoracic spine segmentation:  Appears normal on the 2018 CTA. Alignment: Stable thoracic kyphosis since 2021, mildly exaggerated in the upper thoracic spine. Subtle levoconvex thoracic scoliosis. Vertebrae: Chronic flowing endplate osteophytes throughout the thoracic spine from the T2 level through T11 on the 2021 CTA, resulting in interbody ankylosis of those levels at that time. Some associated costovertebral ankylosis also. On the CT Abdomen and Pelvis last year lower thoracic interbody ankylosis appeared solid through the T12 level (with presumed transitional lumbosacral anatomy). Maintained thoracic vertebral height. Background bone marrow signal within normal limits. No thoracic marrow edema is identified. However, the L1 superior endplate is abnormal.  The superior endplate appears compressed asymmetrically to the right (new compared to 05/09/2023) with estimated 25% loss of vertebral body height. Associated T2 and STIR hyperintensity at the superior endplate there (series 11, image 10). No retropulsion. But the adjacent T12-L1 disc appears relatively unremarkable, similar to other levels. And there is no paraspinal inflammation identified there. Cord: Normal; again CSF pulsation artifact is suspected on sagittal images. Spinal canal appears unremarkable on series 14 axial images. Conus medullaris appears normal at T12-L1. Paraspinal and other soft tissues: Small layering pleural effusions are visible. Negative visible upper abdominal viscera. Thoracic paraspinal soft tissues remain within normal limits. Disc levels: Subtotal or total thoracic interbody ankylosis suspected as detailed above. No thoracic spinal stenosis. IMPRESSION: 1. Hyperostosis related thoracic spinal ankylosis. No  evidence of acute or inflammatory process in the thoracic spine on this noncontrast exam. 2. However, compressed L1 superior endplate, new since 05/09/2023 with up to 25% loss of height. Mild associated marrow edema, but no other features of spinal infection there at this time. No retropulsion or other complicating features. 3. Small layering pleural effusions. Electronically Signed   By: Odessa Fleming M.D.   On: 10/18/2023 12:28   MR CERVICAL SPINE WO CONTRAST Result Date: 10/18/2023 CLINICAL DATA:  43 year old female with neck pain and Streptococcal bacteremia. EXAM: MRI CERVICAL SPINE WITHOUT CONTRAST TECHNIQUE: Multiplanar, multisequence MR imaging of the cervical spine was performed. No intravenous contrast was administered. COMPARISON:  Brain MRI 12/02/2019. FINDINGS: Alignment: Relatively normal cervical lordosis. Vertebrae: Visualized bone marrow signal is within normal limits. No marrow edema or evidence of acute osseous abnormality. Cord: Maintained normal spinal cord signal and morphology. Heterogeneity within the spinal canal, particularly on sagittal STIR imaging (series 5, image 12) appears to be CSF pulsation artifact. No convincing intraspinal fluid collection by axial GRE or sagittal T1 imaging. Posterior Fossa, vertebral arteries, paraspinal tissues: Cervicomedullary junction is within normal limits. Negative visible posterior fossa. Preserved major vascular flow voids in the neck. Negative visible neck soft tissues, right lung apex. There is no prevertebral fluid or edema. Disc levels: Negative for age. No cervical spinal or significant foraminal stenosis. Mild lower cervical ligament flavum hypertrophy. IMPRESSION: Negative noncontrast MRI appearance of the Cervical spine. Electronically Signed   By: Odessa Fleming M.D.   On: 10/18/2023 12:17     Medications: Infusions:   ceFAZolin (ANCEF) IV 2 g (10/20/23 0855)   promethazine (PHENERGAN) injection (IM or IVPB) 150 mL/hr at 10/16/23 0340     Scheduled Medications:  sodium chloride   Intravenous Once   sodium chloride   Intravenous Once   calcitRIOL  0.5 mcg Oral TID   calcium carbonate  1 tablet Oral BID BM   Chlorhexidine Gluconate Cloth  6 each Topical Q0600   darbepoetin (ARANESP) injection - NON-DIALYSIS  100 mcg Subcutaneous Q Fri-1800   gabapentin  300 mg Oral QHS   metoprolol tartrate  25 mg Oral BID    have reviewed scheduled and prn medications.  Physical Exam: General:NAD, comfortable Heart:RRR, s1s2 nl Lungs:clear b/l, no crackle Abdomen:soft, Non-tender, non-distended Extremities:No edema Dialysis Access: AV graft with good thrill and bruit. No erythema, TTP, fluctuance.   Lillia Abed A Narcisa Ganesh 10/20/2023,9:15 AM  LOS: 9 days

## 2023-10-20 NOTE — Progress Notes (Signed)
 Advised by RN CM of pt's d/c to home today. Contacted [facility] and to advised of pt's d/c today and that pt should start tomorrow. Clinic is requesting pt come this afternoon before 5:00 if possible to complete paperwork prior to first treatment tomorrow. Spoke to pt via phone to make pt aware of clinic's request. Pt states she will try to go to clinic this afternoon after picking children up from school. Clinic made aware of this info. Pt advised if she cannot make it to clinic today to please arrive at 11:00 am tomorrow. Pt voices understanding. HD info updated on AVS. Contacted renal PA to please send orders to HD clinic including iv abx needs at d/c.   Olivia Canter Renal Navigator [phone]

## 2023-10-20 NOTE — TOC Transition Note (Signed)
 Transition of Care [facility]) - Discharge Note   Patient Details  Name: Jacqueline [name] MRN: [id number] Date of Birth: 1981/04/08  [facility] CM/SW Contact:  Leone Haven, RN Phone Number: 10/20/2023, 10:21 AM   Clinical Narrative:    For dc today, she has transportation.       Barriers to Discharge: Continued Medical Work up   Patient Goals and CMS Choice Patient states their goals for this hospitalization and ongoing recovery are:: return home   Choice offered to / list presented to : NA      Discharge Placement                       Discharge Plan and Services Additional resources added to the After Visit Summary for   In-house Referral: NA Discharge Planning Services: CM Consult Post Acute Care Choice: NA            DME Agency: NA       HH Arranged: NA          Social Drivers of Health (SDOH) Interventions SDOH Screenings   Food Insecurity: No Food Insecurity (10/11/2023)  Housing: Low Risk  (10/11/2023)  Transportation Needs: No Transportation Needs (10/11/2023)  Utilities: Not At Risk (10/11/2023)  Social Connections: Socially Integrated (10/11/2023)  Tobacco Use: Low Risk  (10/16/2023)     Readmission Risk Interventions    10/12/2023   11:30 AM  Readmission Risk Prevention Plan  Transportation Screening Complete  PCP or Specialist Appt within 5-7 Days Complete  Home Care Screening Complete  Medication Review (RN CM) Complete

## 2023-10-20 NOTE — Progress Notes (Signed)
 Reviewed AVS, patient expressed understanding of medications, MD follow up reviewed.   Removed IV, Site clean, dry and intact.  Patient states all belongings brought to the hospital at time of admission are accounted for and packed to take home.  Pt transported to entrance A where family member was waiting in vehicle to transport home.

## 2023-10-20 NOTE — Plan of Care (Signed)
  Problem: Education: Goal: Knowledge of General Education information will improve Description: Including pain rating scale, medication(s)/side effects and non-pharmacologic comfort measures Outcome: Adequate for Discharge   Problem: Health Behavior/Discharge Planning: Goal: Ability to manage health-related needs will improve Outcome: Adequate for Discharge   Problem: Clinical Measurements: Goal: Ability to maintain clinical measurements within normal limits will improve Outcome: Adequate for Discharge Goal: Will remain free from infection Outcome: Adequate for Discharge Goal: Diagnostic test results will improve Outcome: Adequate for Discharge Goal: Respiratory complications will improve Outcome: Adequate for Discharge Goal: Cardiovascular complication will be avoided Outcome: Adequate for Discharge   Problem: Activity: Goal: Risk for activity intolerance will decrease Outcome: Adequate for Discharge   Problem: Nutrition: Goal: Adequate nutrition will be maintained Outcome: Adequate for Discharge   Problem: Coping: Goal: Level of anxiety will decrease Outcome: Adequate for Discharge   Problem: Elimination: Goal: Will not experience complications related to bowel motility Outcome: Adequate for Discharge Goal: Will not experience complications related to urinary retention Outcome: Adequate for Discharge   Problem: Pain Managment: Goal: General experience of comfort will improve and/or be controlled Outcome: Adequate for Discharge   Problem: Safety: Goal: Ability to remain free from injury will improve Outcome: Adequate for Discharge   Problem: Skin Integrity: Goal: Risk for impaired skin integrity will decrease Outcome: Adequate for Discharge   Problem: Education: Goal: Knowledge of disease and its progression will improve Outcome: Adequate for Discharge

## 2023-10-20 NOTE — Progress Notes (Signed)
 Pt didn't get cefazolin yesterday due to HD access issue but did get HD late. Will give cefazolin 1g x 1 today then resume 2g TTS.   Ulyses Southward, PharmD, BCIDP, AAHIVP, CPP Infectious Disease Pharmacist 10/20/2023 7:43 AM

## 2023-10-20 NOTE — Progress Notes (Signed)
 Jacqueline [name] PROGRESS NOTE  Assessment/ Plan: Pt is a 43 y.o. yo female  with medical hx of HTN, HLD, DMII, CKD V, HFpEF, CKD s/p LUE AVG as OP (Dr. Ronalee Belts at [facility]), with new ESRD.    # Acute febrile illness due to UTI and bacteremia:  Blood culture with streptococcal mutans.  Currently on Cefazolin 2g/HD-TTS with end date of 4/12 .  Primary team has consulted ID - repeat cx NGTD.  TEE neg. Dental scan fine.  MRI neck/back showing some nonspecific changes - ID will review.    # CKD stage V with progression to new ESRD - First HD on 3/26 via her AVG (placed 04/2023 dr. Hetty Blend) -Continue dialysis TTS schedule --> was unable to dialyze today due to pulling clots; exam shows patent AVG but difficulties using last tx and unable to this tx --> vasc US and IR consult - Pt has been accepted at [facility] on TTS 12:00 chair time. Pt can start anytime   # Hypertension: acceptable now off meds since starting HD   # Heart failure with preserved EF  -euvolemic on exam   # Anemia of CKD: - 3/26 iron sat 23%  aranesp 100 mcg every week, monitor lab - Hb < 7, 1u pRBC given 4/3 Hb 6.8 > 9.1  # Secondary hyperparathyroidism, Hypocalcemia: Cont  PO ca.  Intact PTH 415. Calcitriol 0.5 mcg TID.  D/c today.   Subjective: Seen and examined.  Had graft angioplasty 7mm balloon yesterday followed by overnight HD.  Transfused yesterday, no bleeding.  Anxious for d/c today.  Objective Vital signs in last 24 hours: Vitals:   10/20/23 0228 10/20/23 0247 10/20/23 0300 10/20/23 0848  BP:  139/82 139/82 (!) 153/85  Pulse: 85 88 88   Resp: 17 18  18   Temp:  97.9 F (36.6 C)  98.2 F (36.8 C)  TempSrc:  Oral  Oral  SpO2: 98% 99%  99%  Weight:      Height:       Weight change: 1.806 kg  Intake/Output Summary (Last 24 hours) at 10/20/2023 0915 Last data filed at 10/20/2023 0216 Gross per 24 hour  Intake 650 ml  Output 500 ml  Net 150 ml       Labs: RENAL PANEL Recent Labs  Lab  10/16/23 0325 10/17/23 0401 10/18/23 0251 10/19/23 0313 10/20/23 0308  NA 140 141 139 139 138  K 4.3 4.5 3.9 4.1 3.6  CL 107 107 103 105 103  CO2 25 22 26 25 25   GLUCOSE 98 151* 140* 162* 170*  BUN 42* 49* 28* 35* 20  CREATININE 5.13* 5.97* 4.03* 4.99* 2.78*  CALCIUM 6.5* 6.6* 6.7* 6.2* 7.0*  PHOS 5.0* 5.8* 5.0* 6.0* 3.3  ALBUMIN 2.1* 2.4* 2.2* 2.2* 2.4*    Liver Function Tests: Recent Labs  Lab 10/18/23 0251 10/19/23 0313 10/20/23 0308  ALBUMIN 2.2* 2.2* 2.4*   No results for input(s): "LIPASE", "AMYLASE" in the last 168 hours. No results for input(s): "AMMONIA" in the last 168 hours. CBC: Recent Labs    07/21/23 0925 10/11/23 0920 10/11/23 1930 10/12/23 0319 10/16/23 0325 10/17/23 0401 10/18/23 0251 10/19/23 0313 10/20/23 0308  HGB  --    < >  --    < > 7.3* 7.4* 7.4* 6.8* 9.1*  MCV  --    < >  --    < > 94.2 96.3 95.1 96.0 92.9  FERRITIN 323*  --  187  --   --   --   --   --   --  TIBC 210*  --  176*  --   --   --   --   --   --   IRON 72  --  41  --   --   --   --   --   --    < > = values in this interval not displayed.    Cardiac Enzymes: No results for input(s): "CKTOTAL", "CKMB", "CKMBINDEX", "TROPONINI" in the last 168 hours. CBG: No results for input(s): "GLUCAP" in the last 168 hours.   Iron Studies:  No results for input(s): "IRON", "TIBC", "TRANSFERRIN", "FERRITIN" in the last 72 hours.  Studies/Results: IR AV DIALY SHUNT INTRO NEEDLE/INTRACATH INITIAL W/PTA/IMG LEFT Result Date: 10/19/2023 CLINICAL DATA:  End stage renal disease, pulling clots during dialysis EXAM: DIALYSIS SHUNTOGRAM VENOUS ANGIOPLASTY FLUOROSCOPY: Radiation Exposure Index (as provided by the fluoroscopic device): 15.5 mGy air Kerma COMPARISON:  None Available. TECHNIQUE: An 18-gauge angiocatheter was placed antegrade into the peripheral aspect of the patient's left arm synthetic AV hemodialysis fistula for dialysis fistulography. The angiocatheter and surrounding skin were  then prepped with Betadine, draped in usual sterile fashion, infiltrated locally with 1% lidocaine. Intravenous Fentanyl and Versed 1.5mg  were administered by RN during a total moderate (conscious) sedation time of 12 minutes; the patient's level of consciousness and physiological / cardiorespiratory status were monitored continuously by radiology RN under my direct supervision. The angiocatheter was exchanged over a Benson wire for a 6 [place] vascular sheath, through which a 7 mm x 4 cm Conquest angioplasty balloon was advanced to the level of a moderately severe venous anastomotic stenosis for venous angioplasty using 60 second inflation at 30 atmospheres. After follow-up venography, the catheter, sheath, and guidewire were removed and hemostasis achieved with a 2-0 Ethilon purse-string suture. No immediate complication. FINDINGS: Arterial anastomosis widely patent. Left upper arm synthetic straight graft patent. Moderate short-segment stenosis just central to the the venous anastomosis. Some retrograde flow into native venous collateral outflow channels. The more central venous outflow  through the SVC is widely patent. Venous anastomotic stenosis responded ultimately to 7 mm balloon angioplasty. Follow-up shuntogram shows resolution of stenosis, excellent flow through the site, no dissection or extravasation, no further filling of native venous collateral pathways. IMPRESSION: 1. Hemodynamically significant venous outflow stenosis, with excellent angiographic response to 7 mm balloon angioplasty. ACCESS: Remains approachable for percutaneous intervention as needed. Electronically Signed   By: Corlis Leak M.D.   On: 10/19/2023 20:13   MR THORACIC SPINE WO CONTRAST Result Date: 10/18/2023 CLINICAL DATA:  43 year old female with neck pain and Streptococcal bacteremia. EXAM: MRI THORACIC SPINE WITHOUT CONTRAST TECHNIQUE: Multiplanar, multisequence MR imaging of the thoracic spine was performed. No intravenous  contrast was administered. COMPARISON:  Cervical spine MRI today reported separately. Chest CTA 10/01/2019, 05/23/2017. CT Abdomen and Pelvis 05/09/2023. FINDINGS: Limited cervical spine imaging:  Reported separately today. Thoracic spine segmentation:  Appears normal on the 2018 CTA. Alignment: Stable thoracic kyphosis since 2021, mildly exaggerated in the upper thoracic spine. Subtle levoconvex thoracic scoliosis. Vertebrae: Chronic flowing endplate osteophytes throughout the thoracic spine from the T2 level through T11 on the 2021 CTA, resulting in interbody ankylosis of those levels at that time. Some associated costovertebral ankylosis also. On the CT Abdomen and Pelvis last year lower thoracic interbody ankylosis appeared solid through the T12 level (with presumed transitional lumbosacral anatomy). Maintained thoracic vertebral height. Background bone marrow signal within normal limits. No thoracic marrow edema is identified. However, the L1 superior endplate is abnormal.  The superior endplate appears compressed asymmetrically to the right (new compared to 05/09/2023) with estimated 25% loss of vertebral body height. Associated T2 and STIR hyperintensity at the superior endplate there (series 11, image 10). No retropulsion. But the adjacent T12-L1 disc appears relatively unremarkable, similar to other levels. And there is no paraspinal inflammation identified there. Cord: Normal; again CSF pulsation artifact is suspected on sagittal images. Spinal canal appears unremarkable on series 14 axial images. Conus medullaris appears normal at T12-L1. Paraspinal and other soft tissues: Small layering pleural effusions are visible. Negative visible upper abdominal viscera. Thoracic paraspinal soft tissues remain within normal limits. Disc levels: Subtotal or total thoracic interbody ankylosis suspected as detailed above. No thoracic spinal stenosis. IMPRESSION: 1. Hyperostosis related thoracic spinal ankylosis. No  evidence of acute or inflammatory process in the thoracic spine on this noncontrast exam. 2. However, compressed L1 superior endplate, new since 05/09/2023 with up to 25% loss of height. Mild associated marrow edema, but no other features of spinal infection there at this time. No retropulsion or other complicating features. 3. Small layering pleural effusions. Electronically Signed   By: Odessa Fleming M.D.   On: 10/18/2023 12:28   MR CERVICAL SPINE WO CONTRAST Result Date: 10/18/2023 CLINICAL DATA:  43 year old female with neck pain and Streptococcal bacteremia. EXAM: MRI CERVICAL SPINE WITHOUT CONTRAST TECHNIQUE: Multiplanar, multisequence MR imaging of the cervical spine was performed. No intravenous contrast was administered. COMPARISON:  Brain MRI 12/02/2019. FINDINGS: Alignment: Relatively normal cervical lordosis. Vertebrae: Visualized bone marrow signal is within normal limits. No marrow edema or evidence of acute osseous abnormality. Cord: Maintained normal spinal cord signal and morphology. Heterogeneity within the spinal canal, particularly on sagittal STIR imaging (series 5, image 12) appears to be CSF pulsation artifact. No convincing intraspinal fluid collection by axial GRE or sagittal T1 imaging. Posterior Fossa, vertebral arteries, paraspinal tissues: Cervicomedullary junction is within normal limits. Negative visible posterior fossa. Preserved major vascular flow voids in the neck. Negative visible neck soft tissues, right lung apex. There is no prevertebral fluid or edema. Disc levels: Negative for age. No cervical spinal or significant foraminal stenosis. Mild lower cervical ligament flavum hypertrophy. IMPRESSION: Negative noncontrast MRI appearance of the Cervical spine. Electronically Signed   By: Odessa Fleming M.D.   On: 10/18/2023 12:17     Medications: Infusions:   ceFAZolin (ANCEF) IV 2 g (10/20/23 0855)   promethazine (PHENERGAN) injection (IM or IVPB) 150 mL/hr at 10/16/23 0340     Scheduled Medications:  sodium chloride   Intravenous Once   sodium chloride   Intravenous Once   calcitRIOL  0.5 mcg Oral TID   calcium carbonate  1 tablet Oral BID BM   Chlorhexidine Gluconate Cloth  6 each Topical Q0600   darbepoetin (ARANESP) injection - NON-DIALYSIS  100 mcg Subcutaneous Q Fri-1800   gabapentin  300 mg Oral QHS   metoprolol tartrate  25 mg Oral BID    have reviewed scheduled and prn medications.  Physical Exam: General:NAD, comfortable Heart:RRR, s1s2 nl Lungs:clear b/l, no crackle Abdomen:soft, Non-tender, non-distended Extremities:No edema Dialysis Access: AV graft with good thrill and bruit. No erythema, TTP, fluctuance.   Lillia Abed A Narcisa Ganesh 10/20/2023,9:15 AM  LOS: 9 days

## 2023-10-20 NOTE — Discharge Summary (Signed)
 Physician Discharge Summary  Nomi Rudnicki ZOX:[id number] DOB: 1981-06-16 DOA: 10/11/2023  PCP: Harvest Forest, MD  Admit date: 10/11/2023 Discharge date: 10/20/2023  Admitted From: Home Disposition: Home  Recommendations for Outpatient Follow-up:  Follow up with PCP in 1-2 weeks Follow-up with infectious disease as scheduled for 4/10//2025 Continue antibiotics with cefazolin with HD with projected end date 10/28/2023  Home Health: No Equipment/Devices: None  Discharge Condition: Stable CODE STATUS: Full code Diet recommendation: Renal diet, 1200 mL fluid restriction  History of present illness:  Lashea Goda is a 43 y.o. female with medical history significant for hypertension, hyperlipidemia, DM 2, CKD stage V, chronic HFpEF, presented with complaints of generalized weakness, fevers, URI symptoms, nausea, vomiting and loose stools.  She was found to have severe hypocalcemia, CKD has progressed to new ESRD and fever/infectious workup revealed streptococcal bacteremia.  Nephrology was consulted, hemodialysis was initiated this admission.  ID was consulted, TTE and TEE were negative for infectious source, etiology of her bacteremia is unclear, currently on IV cefazolin across dialysis.  Surveillance blood cultures are negative to date.  Complicated by malfunctioning AV G in which IR performed fistulogram with dilation of stenotic portion.  Had subsequent hemodialysis following intervention without complications.  Discharging home with continued antibiotics with dialysis per ID.    Hospital course:  Hypocalcemia/secondary hyperparathyroidism Calcium of 5.7 (corrected to 6.7 to account for hypoalbuminemia). Patient given calcium gluconate and managed with peritoneal dialysis. Calcium improved to 6.7 on 4/2 (corrected calcium of 8.1 for albumin of 2.2). Continue management per nephrology is now started on hemodialysis outpatient.   Streptococcus mutans bacteremia Unclear source.  Infectious disease consulted. Ceftriaxone started empirically and transitioned to Cefazolin after culture data became available. Orthopantogram without any acute dental disease and MRI C-spine and T-spine without acute findings. Transthoracic Echocardiogram and Transesophageal Echocardiogram without evidence of vegetation. ID recommends continue Cefazolin IV x 2 weeks from negative blood cultures 3/30 with end of treatment date 10/28/23.  Cefazolin will be given across dialysis and nephrology team are aware.   Has follow-up at [facility] on 4/10 at 11:15 AM.   UTI Ruled out with insignificant growth on urine culture. Does not appear to be the source for bacteremia.   CKD stage V progressed to new ESRD Patient started on hemodialysis this admission.  AV graft malfunction and underwent IR fistulogram with balloon angioplasty on 10/19/2023.  Received dialysis postintervention and without issue.  Patient has been set up with outpatient hemodialysis with schedule of Tuesday, Thursday, Saturday at [facility].   Diabetes mellitus type 2 Well controlled. A1C of 4.4%.   Chronic HFpEF Stable/euvolemic. Fluid management with hemodialysis.   Essential hypertension Blood pressures on the softer side.  Hydralazine and amlodipine discontinued.  Remains on metoprolol 25 Mg twice daily.   Anemia of ESRD Continue Aranesp 100 mcg every week per nephrology, can continue this across dialysis. Hemoglobin was stable in the low 7 g range which dropped to 6.8 on 4/3, and transfuse 1 unit PRBC with improvement of hemoglobin to 9.1 at time of discharge.    Diarrhea/vomiting Noted since 4/2, no further emesis and tolerating diet.  Diarrhea improving.  Unclear etiology.  Diarrhea could be due to the antibiotics but cannot explain vomiting.     Obesity, class I Body mass index is 31.41 kg/m.  Discharge Diagnoses:  Principal Problem:   Hypocalcemia due to chronic kidney disease Active Problems:   Streptococcal  bacteremia    Discharge Instructions  Discharge Instructions  Call MD for:  difficulty breathing, headache or visual disturbances   Complete by: As directed    Call MD for:  extreme fatigue   Complete by: As directed    Call MD for:  persistant dizziness or light-headedness   Complete by: As directed    Call MD for:  persistant nausea and vomiting   Complete by: As directed    Call MD for:  severe uncontrolled pain   Complete by: As directed    Call MD for:  temperature >100.4   Complete by: As directed    Diet - low sodium heart healthy   Complete by: As directed    Home infusion instructions   Complete by: As directed    To be given at the HD center   Instructions: Flushing of vascular access device: 0.9% NaCl pre/post medication administration and prn patency; Heparin 100 u/ml, 5ml for implanted ports and Heparin 10u/ml, 5ml for all other central venous catheters.   Increase activity slowly   Complete by: As directed       Allergies as of 10/20/2023   No Known Allergies      Medication List     STOP taking these medications    amLODipine 10 MG tablet Commonly known as: NORVASC   furosemide 40 MG tablet Commonly known as: LASIX   hydrALAZINE 50 MG tablet Commonly known as: APRESOLINE   ondansetron 4 MG disintegrating tablet Commonly known as: ZOFRAN-ODT   oxyCODONE-acetaminophen 5-325 MG tablet Commonly known as: PERCOCET/ROXICET   sertraline 100 MG tablet Commonly known as: ZOLOFT   sodium bicarbonate 650 MG tablet       TAKE these medications    calcitRIOL 0.5 MCG capsule Commonly known as: ROCALTROL Take 0.5 mcg by mouth 3 (three) times daily. What changed: Another medication with the same name was removed. Continue taking this medication, and follow the directions you see here.   ceFAZolin IVPB Commonly known as: ANCEF Inject 2 g into the vein Every Tuesday,Thursday,and Saturday with dialysis for 9 days. Indication:  Strep  bacteremia Last Day of Therapy:  10/28/23 Labs - Once weekly:  CBC/D and BMP, Labs - Once weekly: ESR and CRP Method of administration: per HD protocol, to be given at the HD center   ciclopirox 8 % solution Commonly known as: PENLAC Apply 1 Application topically at bedtime. Apply over nail and surrounding skin. Apply daily over previous coat. After seven (7) days, may remove with alcohol and continue cycle.   gabapentin 300 MG capsule Commonly known as: NEURONTIN Take 1 capsule (300 mg total) by mouth at bedtime.   metoprolol tartrate 25 MG tablet Commonly known as: LOPRESSOR Take 1 tablet (25 mg total) by mouth 2 (two) times daily.   Vitamin D (Ergocalciferol) 1.25 MG (50000 UNIT) Caps capsule Commonly known as: DRISDOL Take 1 capsule (50,000 Units total) by mouth every Thursday. What changed:  when to take this additional instructions               Home Infusion Instuctions  (From admission, onward)           Start     Ordered   10/19/23 0000  Home infusion instructions       Comments: To be given at the HD center  Question:  Instructions  Answer:  Flushing of vascular access device: 0.9% NaCl pre/post medication administration and prn patency; Heparin 100 u/ml, 5ml for implanted ports and Heparin 10u/ml, 5ml for all other central venous catheters.  10/19/23 1610            Follow-up Information     Harvest Forest, MD Follow up on 10/23/2023.   Specialty: Internal Medicine Why: @2 :30pm Contact information: [address] [place] [address] [phone]         [facility]. Go on 10/17/2023.   Why: Schedule is Tuesday, Thursday, Saturday with 12:00 pm start time.  On Tuesday (4/1), please arrive at 11:00 am to complete paperwork prior to treatment. Contact information: [address] [place] [address] [phone]         Odette Fraction, MD. Go on 10/26/2023.   Specialty: Infectious  Diseases Why: 1115am Contact information: [address] [employer] [address] [place] [address] [phone]                No Known Allergies  Consultations: Nephrology Infectious disease Interventional radiology   Procedures/Studies: IR AV DIALY SHUNT INTRO NEEDLE/INTRACATH INITIAL W/PTA/IMG LEFT Result Date: 10/19/2023 CLINICAL DATA:  End stage renal disease, pulling clots during dialysis EXAM: DIALYSIS SHUNTOGRAM VENOUS ANGIOPLASTY FLUOROSCOPY: Radiation Exposure Index (as provided by the fluoroscopic device): 15.5 mGy air Kerma COMPARISON:  None Available. TECHNIQUE: An 18-gauge angiocatheter was placed antegrade into the peripheral aspect of the patient's left arm synthetic AV hemodialysis fistula for dialysis fistulography. The angiocatheter and surrounding skin were then prepped with Betadine, draped in usual sterile fashion, infiltrated locally with 1% lidocaine. Intravenous Fentanyl and Versed 1.5mg  were administered by RN during a total moderate (conscious) sedation time of 12 minutes; the patient's level of consciousness and physiological / cardiorespiratory status were monitored continuously by radiology RN under my direct supervision. The angiocatheter was exchanged over a Benson wire for a 6 [place] vascular sheath, through which a 7 mm x 4 cm Conquest angioplasty balloon was advanced to the level of a moderately severe venous anastomotic stenosis for venous angioplasty using 60 second inflation at 30 atmospheres. After follow-up venography, the catheter, sheath, and guidewire were removed and hemostasis achieved with a 2-0 Ethilon purse-string suture. No immediate complication. FINDINGS: Arterial anastomosis widely patent. Left upper arm synthetic straight graft patent. Moderate short-segment stenosis just central to the the venous anastomosis. Some retrograde flow into native venous collateral outflow channels. The more central venous outflow  through the SVC is widely  patent. Venous anastomotic stenosis responded ultimately to 7 mm balloon angioplasty. Follow-up shuntogram shows resolution of stenosis, excellent flow through the site, no dissection or extravasation, no further filling of native venous collateral pathways. IMPRESSION: 1. Hemodynamically significant venous outflow stenosis, with excellent angiographic response to 7 mm balloon angioplasty. ACCESS: Remains approachable for percutaneous intervention as needed. Electronically Signed   By: Corlis Leak M.D.   On: 10/19/2023 20:13   MR THORACIC SPINE WO CONTRAST Result Date: 10/18/2023 CLINICAL DATA:  43 year old female with neck pain and Streptococcal bacteremia. EXAM: MRI THORACIC SPINE WITHOUT CONTRAST TECHNIQUE: Multiplanar, multisequence MR imaging of the thoracic spine was performed. No intravenous contrast was administered. COMPARISON:  Cervical spine MRI today reported separately. Chest CTA 10/01/2019, 05/23/2017. CT Abdomen and Pelvis 05/09/2023. FINDINGS: Limited cervical spine imaging:  Reported separately today. Thoracic spine segmentation:  Appears normal on the 2018 CTA. Alignment: Stable thoracic kyphosis since 2021, mildly exaggerated in the upper thoracic spine. Subtle levoconvex thoracic scoliosis. Vertebrae: Chronic flowing endplate osteophytes throughout the thoracic spine from the T2 level through T11 on the 2021 CTA, resulting in interbody ankylosis of those levels at that time.  Some associated costovertebral ankylosis also. On the CT Abdomen and Pelvis last year lower thoracic interbody ankylosis appeared solid through the T12 level (with presumed transitional lumbosacral anatomy). Maintained thoracic vertebral height. Background bone marrow signal within normal limits. No thoracic marrow edema is identified. However, the L1 superior endplate is abnormal. The superior endplate appears compressed asymmetrically to the right (new compared to 05/09/2023) with estimated 25% loss of vertebral body  height. Associated T2 and STIR hyperintensity at the superior endplate there (series 11, image 10). No retropulsion. But the adjacent T12-L1 disc appears relatively unremarkable, similar to other levels. And there is no paraspinal inflammation identified there. Cord: Normal; again CSF pulsation artifact is suspected on sagittal images. Spinal canal appears unremarkable on series 14 axial images. Conus medullaris appears normal at T12-L1. Paraspinal and other soft tissues: Small layering pleural effusions are visible. Negative visible upper abdominal viscera. Thoracic paraspinal soft tissues remain within normal limits. Disc levels: Subtotal or total thoracic interbody ankylosis suspected as detailed above. No thoracic spinal stenosis. IMPRESSION: 1. Hyperostosis related thoracic spinal ankylosis. No evidence of acute or inflammatory process in the thoracic spine on this noncontrast exam. 2. However, compressed L1 superior endplate, new since 05/09/2023 with up to 25% loss of height. Mild associated marrow edema, but no other features of spinal infection there at this time. No retropulsion or other complicating features. 3. Small layering pleural effusions. Electronically Signed   By: Odessa Fleming M.D.   On: 10/18/2023 12:28   MR CERVICAL SPINE WO CONTRAST Result Date: 10/18/2023 CLINICAL DATA:  43 year old female with neck pain and Streptococcal bacteremia. EXAM: MRI CERVICAL SPINE WITHOUT CONTRAST TECHNIQUE: Multiplanar, multisequence MR imaging of the cervical spine was performed. No intravenous contrast was administered. COMPARISON:  Brain MRI 12/02/2019. FINDINGS: Alignment: Relatively normal cervical lordosis. Vertebrae: Visualized bone marrow signal is within normal limits. No marrow edema or evidence of acute osseous abnormality. Cord: Maintained normal spinal cord signal and morphology. Heterogeneity within the spinal canal, particularly on sagittal STIR imaging (series 5, image 12) appears to be CSF pulsation  artifact. No convincing intraspinal fluid collection by axial GRE or sagittal T1 imaging. Posterior Fossa, vertebral arteries, paraspinal tissues: Cervicomedullary junction is within normal limits. Negative visible posterior fossa. Preserved major vascular flow voids in the neck. Negative visible neck soft tissues, right lung apex. There is no prevertebral fluid or edema. Disc levels: Negative for age. No cervical spinal or significant foraminal stenosis. Mild lower cervical ligament flavum hypertrophy. IMPRESSION: Negative noncontrast MRI appearance of the Cervical spine. Electronically Signed   By: Odessa Fleming M.D.   On: 10/18/2023 12:17   DG Orthopantogram Result Date: 10/17/2023 CLINICAL DATA:  Struck to coccal bacteremia EXAM: ORTHOPANTOGRAM/PANORAMIC COMPARISON:  None Available. FINDINGS: Panorex view of the mandible was performed. There are no acute bony abnormalities. No evidence of dental erosions or caries. Multiple prior teeth extractions. Visualized paranasal sinuses are clear. IMPRESSION: 1. Multiple prior teeth extractions. No evidence of acute dental disease. Electronically Signed   By: Sharlet Salina M.D.   On: 10/17/2023 20:33   ECHO TEE Result Date: 10/16/2023    TRANSESOPHOGEAL ECHO REPORT   Patient Name:   JAZARIAH TEALL Date of Exam: 10/16/2023 Medical Rec #:  [id number]    Height:       64.0 in Accession #:    [id number]   Weight:       176.6 lb Date of Birth:  1980/08/02    BSA:  1.856 m Patient Age:    42 years     BP:           109/61 mmHg Patient Gender: F            HR:           80 bpm. Exam Location:  Inpatient Procedure: 3D Echo, Transesophageal Echo, Saline Contrast Bubble Study, Cardiac            Doppler and Color Doppler (Both Spectral and Color Flow Doppler were            utilized during procedure). Indications:     Endocarditis  History:         Patient has prior history of Echocardiogram examinations, most                  recent 10/12/2023. CHF; Risk Factors:Hypertension  and Diabetes.  Sonographer:     Rosaland Lao Sonographer#2:   Irving Burton Senior Referring Phys:  [id number] Corrin Parker Diagnosing Phys: Lennie Odor MD PROCEDURE: After discussion of the risks and benefits of a TEE, an informed consent was obtained from the patient. TEE procedure time was 12 minutes. The transesophogeal probe was passed without difficulty through the esophogus of the patient. Imaged were obtained with the patient in a left lateral decubitus position. Sedation performed by different physician. The patient was monitored while under deep sedation. Anesthestetic sedation was provided intravenously by Anesthesiology: 170mg  of Propofol, 80mg  of Lidocaine. Image quality was excellent. The patient's vital signs; including heart rate, blood pressure, and oxygen saturation; remained stable throughout the procedure. The patient developed no complications during the procedure.  IMPRESSIONS  1. Left ventricular ejection fraction, by estimation, is 65 to 70%. Left ventricular ejection fraction by 3D volume is 67 %. The left ventricle has normal function. The left ventricle has no regional wall motion abnormalities.  2. Right ventricular systolic function is normal. The right ventricular size is normal.  3. No left atrial/left atrial appendage thrombus was detected.  4. The mitral valve is grossly normal. Trivial mitral valve regurgitation. No evidence of mitral stenosis.  5. The aortic valve is tricuspid. Aortic valve regurgitation is not visualized. No aortic stenosis is present.  6. Agitated saline contrast bubble study was negative, with no evidence of any interatrial shunt.  7. 3D performed for the EF and demonstrates LVEF by 3D 67%. Conclusion(s)/Recommendation(s): No evidence of vegetation/infective endocarditis on this transesophageael echocardiogram. FINDINGS  Left Ventricle: Left ventricular ejection fraction, by estimation, is 65 to 70%. Left ventricular ejection fraction by 3D volume is 67 %. The  left ventricle has normal function. The left ventricle has no regional wall motion abnormalities. The left ventricular internal cavity size was normal in size. Right Ventricle: The right ventricular size is normal. No increase in right ventricular wall thickness. Right ventricular systolic function is normal. Left Atrium: Left atrial size was normal in size. No left atrial/left atrial appendage thrombus was detected. Right Atrium: Right atrial size was normal in size. Pericardium: Trivial pericardial effusion is present. Mitral Valve: The mitral valve is grossly normal. Trivial mitral valve regurgitation. No evidence of mitral valve stenosis. There is no evidence of mitral valve vegetation. Tricuspid Valve: The tricuspid valve is grossly normal. Tricuspid valve regurgitation is trivial. No evidence of tricuspid stenosis. There is no evidence of tricuspid valve vegetation. Aortic Valve: The aortic valve is tricuspid. Aortic valve regurgitation is not visualized. No aortic stenosis is present. There is no evidence of aortic valve vegetation.  Pulmonic Valve: The pulmonic valve was grossly normal. Pulmonic valve regurgitation is not visualized. No evidence of pulmonic stenosis. There is no evidence of pulmonic valve vegetation. Aorta: The aortic root and ascending aorta are structurally normal, with no evidence of dilitation. Venous: The left lower pulmonary vein, left upper pulmonary vein, right lower pulmonary vein and right upper pulmonary vein are normal. IAS/Shunts: The atrial septum is grossly normal. Agitated saline contrast was given intravenously to evaluate for intracardiac shunting. Agitated saline contrast bubble study was negative, with no evidence of any interatrial shunt. Additional Comments: Spectral Doppler performed.  3D Volume EF LV 3D EF:    Left ventricular ejection fraction by 3D volume is 67              %.  3D Volume EF LV 3D EF:    67.20 % LV 3D EDV:   104800.00 mm LV 3D ESV:   34300.00 mm LV  3D SV:    70500.00 mm  3D Volume EF: 3D EF:        67 %  AORTA Ao Root diam: 2.98 cm Ao Asc diam:  3.00 cm Lennie Odor MD Electronically signed by Lennie Odor MD Signature Date/Time: 10/16/2023/2:05:15 PM    Final    EP STUDY Result Date: 10/16/2023 See surgical note for result.  DG CHEST PORT 1 VIEW Result Date: 10/13/2023 CLINICAL DATA:  Fever. EXAM: PORTABLE CHEST 1 VIEW COMPARISON:  October 11, 2023. FINDINGS: Stable cardiomediastinal silhouette. Both lungs are clear. The visualized skeletal structures are unremarkable. IMPRESSION: No active disease. Electronically Signed   By: Lupita Raider M.D.   On: 10/13/2023 20:03   VAS [place] LOWER EXTREMITY VENOUS (DVT) Result Date: 10/12/2023  Lower Venous DVT Study Patient Name:  KENZINGTON MIELKE  Date of Exam:   10/12/2023 Medical Rec #: [id number]     Accession #:    [id number] Date of Birth: 1980/09/29     Patient Gender: F Patient Age:   24 years Exam Location:  [facility] Procedure:      VAS [place] LOWER EXTREMITY VENOUS (DVT) Referring Phys: [facility] EZENDUKA --------------------------------------------------------------------------------  Indications: Edema.  Risk Factors: Past pregnancy. Comparison Study: No significant changes seen since the previous exam 08/31/22. Performing Technologist: Shona Simpson  Examination Guidelines: A complete evaluation includes B-mode imaging, spectral Doppler, color Doppler, and power Doppler as needed of all accessible portions of each vessel. Bilateral testing is considered an integral part of a complete examination. Limited examinations for reoccurring indications may be performed as noted. The reflux portion of the exam is performed with the patient in reverse Trendelenburg.  +---------+---------------+---------+-----------+----------+--------------+ RIGHT    CompressibilityPhasicitySpontaneityPropertiesThrombus Aging +---------+---------------+---------+-----------+----------+--------------+ CFV      Full            Yes      Yes                                 +---------+---------------+---------+-----------+----------+--------------+ SFJ      Full                                                        +---------+---------------+---------+-----------+----------+--------------+ FV Prox  Full                                                        +---------+---------------+---------+-----------+----------+--------------+  FV Mid   Full                                                        +---------+---------------+---------+-----------+----------+--------------+ FV DistalFull                    Yes                                 +---------+---------------+---------+-----------+----------+--------------+ PFV      Full                                                        +---------+---------------+---------+-----------+----------+--------------+ POP      Full           Yes      Yes                                 +---------+---------------+---------+-----------+----------+--------------+ PTV      Full                                                        +---------+---------------+---------+-----------+----------+--------------+ PERO     Full                                                        +---------+---------------+---------+-----------+----------+--------------+   +---------+---------------+---------+-----------+----------+--------------+ LEFT     CompressibilityPhasicitySpontaneityPropertiesThrombus Aging +---------+---------------+---------+-----------+----------+--------------+ CFV      Full           Yes      Yes                                 +---------+---------------+---------+-----------+----------+--------------+ SFJ      Full                                                        +---------+---------------+---------+-----------+----------+--------------+ FV Prox  Full                                                         +---------+---------------+---------+-----------+----------+--------------+ FV Mid   Full                                                        +---------+---------------+---------+-----------+----------+--------------+  FV DistalFull                                                        +---------+---------------+---------+-----------+----------+--------------+ PFV      Full                                                        +---------+---------------+---------+-----------+----------+--------------+ POP      Full           Yes      Yes                                 +---------+---------------+---------+-----------+----------+--------------+ PTV      Full                                                        +---------+---------------+---------+-----------+----------+--------------+ PERO     Full                                                        +---------+---------------+---------+-----------+----------+--------------+     Summary: BILATERAL: - No evidence of deep vein thrombosis seen in the lower extremities, bilaterally. -No evidence of popliteal cyst, bilaterally.   *See table(s) above for measurements and observations. Electronically signed by Heath Lark on 10/12/2023 at 6:44:55 PM.    Final    ECHOCARDIOGRAM COMPLETE Result Date: 10/12/2023    ECHOCARDIOGRAM REPORT   Patient Name:   SHEY YOTT Date of Exam: 10/12/2023 Medical Rec #:  [id number]    Height:       64.0 in Accession #:    [id number]   Weight:       175.0 lb Date of Birth:  04-Jun-1981    BSA:          1.848 m Patient Age:    42 years     BP:           125/75 mmHg Patient Gender: F            HR:           103 bpm. Exam Location:  Inpatient Procedure: 2D Echo, Cardiac Doppler and Color Doppler (Both Spectral and Color            Flow Doppler were utilized during procedure). Indications:    Dyspnea  History:        Patient has prior history of Echocardiogram examinations, most                  recent 07/05/2019. CHF, Arrythmias:Atrial Fibrillation; Risk                 Factors:Diabetes.  Sonographer:    Amy Chionchio Referring Phys: [id number] Monica Martinez EZENDUKA IMPRESSIONS  1. Left ventricular ejection fraction, by estimation, is 60  to 65%. The left ventricle has normal function. The left ventricle has no regional wall motion abnormalities. There is moderate left ventricular hypertrophy. Left ventricular diastolic parameters are consistent with Grade I diastolic dysfunction (impaired relaxation).  2. Right ventricular systolic function is normal. The right ventricular size is normal.  3. The mitral valve is normal in structure. Trivial mitral valve regurgitation. No evidence of mitral stenosis.  4. The aortic valve is normal in structure. Aortic valve regurgitation is not visualized. No aortic stenosis is present.  5. The inferior vena cava is normal in size with greater than 50% respiratory variability, suggesting right atrial pressure of 3 mmHg. FINDINGS  Left Ventricle: Left ventricular ejection fraction, by estimation, is 60 to 65%. The left ventricle has normal function. The left ventricle has no regional wall motion abnormalities. The left ventricular internal cavity size was normal in size. There is  moderate left ventricular hypertrophy. Left ventricular diastolic parameters are consistent with Grade I diastolic dysfunction (impaired relaxation). Right Ventricle: The right ventricular size is normal. No increase in right ventricular wall thickness. Right ventricular systolic function is normal. Left Atrium: Left atrial size was normal in size. Right Atrium: Right atrial size was normal in size. Pericardium: There is no evidence of pericardial effusion. Mitral Valve: The mitral valve is normal in structure. Trivial mitral valve regurgitation. No evidence of mitral valve stenosis. MV peak gradient, 8.2 mmHg. The mean mitral valve gradient is 4.0 mmHg. Tricuspid Valve: The tricuspid valve is  normal in structure. Tricuspid valve regurgitation is not demonstrated. No evidence of tricuspid stenosis. Aortic Valve: The aortic valve is normal in structure. Aortic valve regurgitation is not visualized. No aortic stenosis is present. Aortic valve mean gradient measures 9.0 mmHg. Aortic valve peak gradient measures 14.6 mmHg. Aortic valve area, by VTI measures 3.00 cm. Pulmonic Valve: The pulmonic valve was normal in structure. Pulmonic valve regurgitation is not visualized. No evidence of pulmonic stenosis. Aorta: The aortic root is normal in size and structure. Venous: The inferior vena cava is normal in size with greater than 50% respiratory variability, suggesting right atrial pressure of 3 mmHg. IAS/Shunts: No atrial level shunt detected by color flow Doppler.  LEFT VENTRICLE PLAX 2D LVIDd:         3.40 cm     Diastology LVIDs:         1.90 cm     LV e' medial:    6.09 cm/s LV PW:         1.30 cm     LV E/e' medial:  14.5 LV IVS:        1.40 cm     LV e' lateral:   7.46 cm/s LVOT diam:     2.10 cm     LV E/e' lateral: 11.8 LV SV:         88 LV SV Index:   48 LVOT Area:     3.46 cm  LV Volumes (MOD) LV vol d, MOD A2C: 68.8 ml LV vol d, MOD A4C: 99.7 ml LV vol s, MOD A2C: 14.6 ml LV vol s, MOD A4C: 29.6 ml LV SV MOD A2C:     54.2 ml LV SV MOD A4C:     99.7 ml LV SV MOD BP:      64.1 ml RIGHT VENTRICLE          IVC RV Basal diam:  2.50 cm  IVC diam: 1.50 cm TAPSE (M-mode): 2.3 cm LEFT ATRIUM  Index        RIGHT ATRIUM           Index LA Vol (A2C):   39.1 ml 21.15 ml/m  RA Area:     12.40 cm LA Vol (A4C):   54.5 ml 29.48 ml/m  RA Volume:   25.90 ml  14.01 ml/m LA Biplane Vol: 48.1 ml 26.02 ml/m  AORTIC VALVE                     PULMONIC VALVE AV Area (Vmax):    2.90 cm      PV Vmax:       1.27 m/s AV Area (Vmean):   2.76 cm      PV Peak grad:  6.5 mmHg AV Area (VTI):     3.00 cm AV Vmax:           191.00 cm/s AV Vmean:          147.000 cm/s AV VTI:            0.293 m AV Peak Grad:      14.6  mmHg AV Mean Grad:      9.0 mmHg LVOT Vmax:         160.00 cm/s LVOT Vmean:        117.000 cm/s LVOT VTI:          0.254 m LVOT/AV VTI ratio: 0.87  AORTA Ao Root diam: 3.30 cm Ao Asc diam:  3.50 cm MITRAL VALVE MV Area (PHT): 4.39 cm     SHUNTS MV Area VTI:   3.37 cm     Systemic VTI:  0.25 m MV Peak grad:  8.2 mmHg     Systemic Diam: 2.10 cm MV Mean grad:  4.0 mmHg MV Vmax:       1.43 m/s MV Vmean:      98.8 cm/s MV Decel Time: 173 msec MV E velocity: 88.10 cm/s MV A velocity: 104.00 cm/s MV E/A ratio:  0.85 Donato Schultz MD Electronically signed by Donato Schultz MD Signature Date/Time: 10/12/2023/2:32:28 PM    Final    DG Chest 2 View Result Date: 10/11/2023 CLINICAL DATA:  Midline chest pain. EXAM: CHEST - 2 VIEW COMPARISON:  08/29/2022. FINDINGS: Bilateral lung fields are clear. Bilateral costophrenic angles are clear. Note is made of elevated right hemidiaphragm. Normal cardio-mediastinal silhouette. No acute osseous abnormalities. The soft tissues are within normal limits. IMPRESSION: No active cardiopulmonary disease. Electronically Signed   By: Jules Schick M.D.   On: 10/11/2023 09:42     Subjective: Patient seen and examined at bedside, lying in bed.  No complaints this morning.  Tolerated hemodialysis well yesterday afternoon.  Ready for discharge home.  Discussed with nephrology.  Will continue on antibiotics for bacteremia with end date projected on 4/12.  Has outpatient follow-up with ID on 4/10.  No other specific questions or concerns at this time.  Denies headache, no vision changes, no chest pain, no palpitations, no shortness of breath, no abdominal pain, no fever/chills/night sweats, no nausea/vomiting/diarrhea, no focal weakness, no fatigue, no paresthesias.  No acute events overnight per nursing staff.  Discharge Exam: Vitals:   10/20/23 0300 10/20/23 0848  BP: 139/82 (!) 153/85  Pulse: 88   Resp:  18  Temp:  98.2 F (36.8 C)  SpO2:  99%   Vitals:   10/20/23 0228 10/20/23  0247 10/20/23 0300 10/20/23 0848  BP:  139/82 139/82 (!) 153/85  Pulse: 85 88 88  Resp: 17 18  18   Temp:  97.9 F (36.6 C)  98.2 F (36.8 C)  TempSrc:  Oral  Oral  SpO2: 98% 99%  99%  Weight:      Height:        Physical Exam: GEN: NAD, alert and oriented x 3, wd/wn HEENT: NCAT, PERRL, EOMI, sclera clear, MMM PULM: CTAB w/o wheezes/crackles, normal respiratory effort, on room air CV: RRR w/o M/G/R GI: abd soft, NTND, NABS, no R/G/M MSK: no peripheral edema, muscle strength globally intact 5/5 bilateral upper/lower extremities NEURO: CN II-XII intact, no focal deficits, sensation to light touch intact PSYCH: normal mood/affect Integumentary: dry/intact, no rashes or wounds    The results of significant diagnostics from this hospitalization (including imaging, microbiology, ancillary and laboratory) are listed below for reference.     Microbiology: Recent Results (from the past 240 hours)  Resp panel by RT-PCR (RSV, Flu A&B, Covid) Anterior Nasal Swab     Status: None   Collection Time: 10/11/23  8:01 AM   Specimen: Anterior Nasal Swab  Result Value Ref Range Status   SARS Coronavirus 2 by RT PCR NEGATIVE NEGATIVE Final    Comment: (NOTE) SARS-CoV-2 target nucleic acids are NOT DETECTED.  The SARS-CoV-2 RNA is generally detectable in upper respiratory specimens during the acute phase of infection. The lowest concentration of SARS-CoV-2 viral copies this assay can detect is 138 copies/mL. A negative result does not preclude SARS-Cov-2 infection and should not be used as the sole basis for treatment or other patient management decisions. A negative result may occur with  improper specimen collection/handling, submission of specimen other than nasopharyngeal swab, presence of viral mutation(s) within the areas targeted by this assay, and inadequate number of viral copies(<138 copies/mL). A negative result must be combined with clinical observations, patient history, and  epidemiological information. The expected result is Negative.  Fact Sheet for Patients:  BloggerCourse.com  Fact Sheet for Healthcare Providers:  SeriousBroker.it  This test is no t yet approved or cleared by the [place] FDA and  has been authorized for detection and/or diagnosis of SARS-CoV-2 by FDA under an Emergency Use Authorization (EUA). This EUA will remain  in effect (meaning this test can be used) for the duration of the COVID-19 declaration under Section 564(b)(1) of the Act, 21 U.S.C.section 360bbb-3(b)(1), unless the authorization is terminated  or revoked sooner.       Influenza A by PCR NEGATIVE NEGATIVE Final   Influenza B by PCR NEGATIVE NEGATIVE Final    Comment: (NOTE) The Xpert Xpress SARS-CoV-2/FLU/RSV plus assay is intended as an aid in the diagnosis of influenza from Nasopharyngeal swab specimens and should not be used as a sole basis for treatment. Nasal washings and aspirates are unacceptable for Xpert Xpress SARS-CoV-2/FLU/RSV testing.  Fact Sheet for Patients: BloggerCourse.com  Fact Sheet for Healthcare Providers: SeriousBroker.it  This test is not yet approved or cleared by the [place] FDA and has been authorized for detection and/or diagnosis of SARS-CoV-2 by FDA under an Emergency Use Authorization (EUA). This EUA will remain in effect (meaning this test can be used) for the duration of the COVID-19 declaration under Section 564(b)(1) of the Act, 21 U.S.C. section 360bbb-3(b)(1), unless the authorization is terminated or revoked.     Resp Syncytial Virus by PCR NEGATIVE NEGATIVE Final    Comment: (NOTE) Fact Sheet for Patients: BloggerCourse.com  Fact Sheet for Healthcare Providers: SeriousBroker.it  This test is not yet approved or cleared by the [place] FDA  and has been  authorized for detection and/or diagnosis of SARS-CoV-2 by FDA under an Emergency Use Authorization (EUA). This EUA will remain in effect (meaning this test can be used) for the duration of the COVID-19 declaration under Section 564(b)(1) of the Act, 21 U.S.C. section 360bbb-3(b)(1), unless the authorization is terminated or revoked.  Performed at [facility], [address] [place], [place] [address]   Culture, [place] Urine     Status: Abnormal   Collection Time: 10/13/23  5:19 PM   Specimen: Urine, Random  Result Value Ref Range Status   Specimen Description URINE, RANDOM  Final   Special Requests NONE  Final   Culture (A)  Final    <10,000 COLONIES/mL INSIGNIFICANT GROWTH NO GROUP B STREP (S.AGALACTIAE) ISOLATED Performed at [facility], [address] [place], [place] [address]    Report Status 10/15/2023 FINAL  Final  Culture, blood (Routine X 2) w Reflex to ID Panel     Status: Abnormal   Collection Time: 10/13/23  6:53 PM   Specimen: BLOOD  Result Value Ref Range Status   Specimen Description BLOOD SITE NOT SPECIFIED  Final   Special Requests   Final    BOTTLES DRAWN AEROBIC AND ANAEROBIC Blood Culture results may not be optimal due to an inadequate volume of blood received in culture bottles   Culture  Setup Time   Final    GRAM POSITIVE COCCI IN CHAINS IN BOTH AEROBIC AND ANAEROBIC BOTTLES CRITICAL RESULT CALLED TO, READ BACK BY AND VERIFIED WITH: PHARMD CAREN AMEND [phone] [id number] BY J RAZZAK,MT Performed at [facility], [address] [place], [place] [address]    Culture STREPTOCOCCUS MUTANS (A)  Final   Report Status 10/16/2023 FINAL  Final   Organism ID, Bacteria STREPTOCOCCUS MUTANS  Final      Susceptibility   Streptococcus mutans - MIC*    PENICILLIN <=0.06 SENSITIVE Sensitive     CEFTRIAXONE <=0.12 SENSITIVE Sensitive     ERYTHROMYCIN <=0.12 SENSITIVE Sensitive     LEVOFLOXACIN 2 SENSITIVE Sensitive     VANCOMYCIN 1 SENSITIVE  Sensitive     * STREPTOCOCCUS MUTANS  Blood Culture ID Panel (Reflexed)     Status: Abnormal   Collection Time: 10/13/23  6:53 PM  Result Value Ref Range Status   Enterococcus faecalis NOT DETECTED NOT DETECTED Final   Enterococcus Faecium NOT DETECTED NOT DETECTED Final   Listeria monocytogenes NOT DETECTED NOT DETECTED Final   Staphylococcus species NOT DETECTED NOT DETECTED Final   Staphylococcus aureus (BCID) NOT DETECTED NOT DETECTED Final   Staphylococcus epidermidis NOT DETECTED NOT DETECTED Final   Staphylococcus lugdunensis NOT DETECTED NOT DETECTED Final   Streptococcus species DETECTED (A) NOT DETECTED Final    Comment: Not Enterococcus species, Streptococcus agalactiae, Streptococcus pyogenes, or Streptococcus pneumoniae. CRITICAL RESULT CALLED TO, READ BACK BY AND VERIFIED WITH: PHARMD CAREN AMEND [id number] BY J RAZZAK, MT    Streptococcus agalactiae NOT DETECTED NOT DETECTED Final   Streptococcus pneumoniae NOT DETECTED NOT DETECTED Final   Streptococcus pyogenes NOT DETECTED NOT DETECTED Final   A.calcoaceticus-baumannii NOT DETECTED NOT DETECTED Final   Bacteroides fragilis NOT DETECTED NOT DETECTED Final   Enterobacterales NOT DETECTED NOT DETECTED Final   Enterobacter cloacae complex NOT DETECTED NOT DETECTED Final   Escherichia coli NOT DETECTED NOT DETECTED Final   Klebsiella aerogenes NOT DETECTED NOT DETECTED Final   Klebsiella oxytoca NOT DETECTED NOT DETECTED Final   Klebsiella pneumoniae NOT  DETECTED NOT DETECTED Final   Proteus species NOT DETECTED NOT DETECTED Final   Salmonella species NOT DETECTED NOT DETECTED Final   Serratia marcescens NOT DETECTED NOT DETECTED Final   Haemophilus influenzae NOT DETECTED NOT DETECTED Final   Neisseria meningitidis NOT DETECTED NOT DETECTED Final   Pseudomonas aeruginosa NOT DETECTED NOT DETECTED Final   Stenotrophomonas maltophilia NOT DETECTED NOT DETECTED Final   Candida albicans NOT DETECTED NOT DETECTED Final    Candida auris NOT DETECTED NOT DETECTED Final   Candida glabrata NOT DETECTED NOT DETECTED Final   Candida krusei NOT DETECTED NOT DETECTED Final   Candida parapsilosis NOT DETECTED NOT DETECTED Final   Candida tropicalis NOT DETECTED NOT DETECTED Final   Cryptococcus neoformans/gattii NOT DETECTED NOT DETECTED Final    Comment: Performed at [facility], [address] [place], [place] [address]  Culture, blood (Routine X 2) w Reflex to ID Panel     Status: Abnormal   Collection Time: 10/13/23  7:02 PM   Specimen: BLOOD  Result Value Ref Range Status   Specimen Description BLOOD SITE NOT SPECIFIED  Final   Special Requests   Final    BOTTLES DRAWN AEROBIC ONLY Blood Culture results may not be optimal due to an inadequate volume of blood received in culture bottles   Culture  Setup Time   Final    GRAM POSITIVE COCCI IN CHAINS AEROBIC BOTTLE ONLY CRITICAL VALUE NOTED.  VALUE IS CONSISTENT WITH PREVIOUSLY REPORTED AND CALLED VALUE.    Culture (A)  Final    STREPTOCOCCUS MUTANS SUSCEPTIBILITIES PERFORMED ON PREVIOUS CULTURE WITHIN THE LAST 5 DAYS. Performed at [facility], [address], [place], [place] [address]    Report Status 10/16/2023 FINAL  Final  Culture, blood (Routine X 2) w Reflex to ID Panel     Status: None   Collection Time: 10/15/23  2:21 PM   Specimen: BLOOD RIGHT ARM  Result Value Ref Range Status   Specimen Description BLOOD RIGHT ARM  Final   Special Requests   Final    BOTTLES DRAWN AEROBIC AND ANAEROBIC Blood Culture results may not be optimal due to an inadequate volume of blood received in culture bottles   Culture   Final    NO GROWTH 5 DAYS Performed at [facility], [address] [place], [place] [address]    Report Status 10/20/2023 FINAL  Final  Culture, blood (Routine X 2) w Reflex to ID Panel     Status: None   Collection Time: 10/15/23  2:21 PM   Specimen: BLOOD RIGHT HAND  Result Value Ref Range Status   Specimen  Description BLOOD RIGHT HAND  Final   Special Requests   Final    BOTTLES DRAWN AEROBIC AND ANAEROBIC Blood Culture adequate volume   Culture   Final    NO GROWTH 5 DAYS Performed at [facility], [address] [place], [place] [address]    Report Status 10/20/2023 FINAL  Final     Labs: BNP (last 3 results) Recent Labs    10/11/23 0920  BNP 103.5*   Basic Metabolic Panel: Recent Labs  Lab 10/16/23 0325 10/17/23 0401 10/18/23 0251 10/19/23 0313 10/20/23 0308  NA 140 141 139 139 138  K 4.3 4.5 3.9 4.1 3.6  CL 107 107 103 105 103  CO2 25 22 26 25 25   GLUCOSE 98 151* 140* 162* 170*  BUN 42* 49* 28* 35* 20  CREATININE 5.13*  5.97* 4.03* 4.99* 2.78*  CALCIUM 6.5* 6.6* 6.7* 6.2* 7.0*  PHOS 5.0* 5.8* 5.0* 6.0* 3.3   Liver Function Tests: Recent Labs  Lab 10/16/23 0325 10/17/23 0401 10/18/23 0251 10/19/23 0313 10/20/23 0308  ALBUMIN 2.1* 2.4* 2.2* 2.2* 2.4*   No results for input(s): "LIPASE", "AMYLASE" in the last 168 hours. No results for input(s): "AMMONIA" in the last 168 hours. CBC: Recent Labs  Lab 10/16/23 0325 10/17/23 0401 10/18/23 0251 10/19/23 0313 10/20/23 0308  WBC 8.2 8.8 8.7 7.1 9.5  NEUTROABS 4.7 5.4 5.2 4.6 6.2  HGB 7.3* 7.4* 7.4* 6.8* 9.1*  HCT 22.9* 23.7* 23.4* 21.4* 27.6*  MCV 94.2 96.3 95.1 96.0 92.9  PLT 222 260 246 245 230   Cardiac Enzymes: No results for input(s): "CKTOTAL", "CKMB", "CKMBINDEX", "TROPONINI" in the last 168 hours. BNP: Invalid input(s): "POCBNP" CBG: No results for input(s): "GLUCAP" in the last 168 hours. D-Dimer No results for input(s): "DDIMER" in the last 72 hours. Hgb A1c No results for input(s): "HGBA1C" in the last 72 hours. Lipid Profile No results for input(s): "CHOL", "HDL", "LDLCALC", "TRIG", "CHOLHDL", "LDLDIRECT" in the last 72 hours. Thyroid function studies No results for input(s): "TSH", "T4TOTAL", "T3FREE", "THYROIDAB" in the last 72 hours.  Invalid input(s): "FREET3" Anemia work  up No results for input(s): "VITAMINB12", "FOLATE", "FERRITIN", "TIBC", "IRON", "RETICCTPCT" in the last 72 hours. Urinalysis    Component Value Date/Time   COLORURINE YELLOW 10/13/2023 1728   APPEARANCEUR HAZY (A) 10/13/2023 1728   LABSPEC 1.014 10/13/2023 1728   PHURINE 7.0 10/13/2023 1728   GLUCOSEU 150 (A) 10/13/2023 1728   HGBUR SMALL (A) 10/13/2023 1728   BILIRUBINUR NEGATIVE 10/13/2023 1728   KETONESUR NEGATIVE 10/13/2023 1728   PROTEINUR >=300 (A) 10/13/2023 1728   UROBILINOGEN 0.2 08/21/2012 2130   NITRITE NEGATIVE 10/13/2023 1728   LEUKOCYTESUR MODERATE (A) 10/13/2023 1728   Sepsis Labs Recent Labs  Lab 10/17/23 0401 10/18/23 0251 10/19/23 0313 10/20/23 0308  WBC 8.8 8.7 7.1 9.5   Microbiology Recent Results (from the past 240 hours)  Resp panel by RT-PCR (RSV, Flu A&B, Covid) Anterior Nasal Swab     Status: None   Collection Time: 10/11/23  8:01 AM   Specimen: Anterior Nasal Swab  Result Value Ref Range Status   SARS Coronavirus 2 by RT PCR NEGATIVE NEGATIVE Final    Comment: (NOTE) SARS-CoV-2 target nucleic acids are NOT DETECTED.  The SARS-CoV-2 RNA is generally detectable in upper respiratory specimens during the acute phase of infection. The lowest concentration of SARS-CoV-2 viral copies this assay can detect is 138 copies/mL. A negative result does not preclude SARS-Cov-2 infection and should not be used as the sole basis for treatment or other patient management decisions. A negative result may occur with  improper specimen collection/handling, submission of specimen other than nasopharyngeal swab, presence of viral mutation(s) within the areas targeted by this assay, and inadequate number of viral copies(<138 copies/mL). A negative result must be combined with clinical observations, patient history, and epidemiological information. The expected result is Negative.  Fact Sheet for Patients:  BloggerCourse.com  Fact Sheet  for Healthcare Providers:  SeriousBroker.it  This test is no t yet approved or cleared by the [place] FDA and  has been authorized for detection and/or diagnosis of SARS-CoV-2 by FDA under an Emergency Use Authorization (EUA). This EUA will remain  in effect (meaning this test can be used) for the duration of the COVID-19 declaration under Section 564(b)(1) of the Act, 21 U.S.C.section 360bbb-3(b)(1),  unless the authorization is terminated  or revoked sooner.       Influenza A by PCR NEGATIVE NEGATIVE Final   Influenza B by PCR NEGATIVE NEGATIVE Final    Comment: (NOTE) The Xpert Xpress SARS-CoV-2/FLU/RSV plus assay is intended as an aid in the diagnosis of influenza from Nasopharyngeal swab specimens and should not be used as a sole basis for treatment. Nasal washings and aspirates are unacceptable for Xpert Xpress SARS-CoV-2/FLU/RSV testing.  Fact Sheet for Patients: BloggerCourse.com  Fact Sheet for Healthcare Providers: SeriousBroker.it  This test is not yet approved or cleared by the [place] FDA and has been authorized for detection and/or diagnosis of SARS-CoV-2 by FDA under an Emergency Use Authorization (EUA). This EUA will remain in effect (meaning this test can be used) for the duration of the COVID-19 declaration under Section 564(b)(1) of the Act, 21 U.S.C. section 360bbb-3(b)(1), unless the authorization is terminated or revoked.     Resp Syncytial Virus by PCR NEGATIVE NEGATIVE Final    Comment: (NOTE) Fact Sheet for Patients: BloggerCourse.com  Fact Sheet for Healthcare Providers: SeriousBroker.it  This test is not yet approved or cleared by the [place] FDA and has been authorized for detection and/or diagnosis of SARS-CoV-2 by FDA under an Emergency Use Authorization (EUA). This EUA will remain in effect (meaning  this test can be used) for the duration of the COVID-19 declaration under Section 564(b)(1) of the Act, 21 U.S.C. section 360bbb-3(b)(1), unless the authorization is terminated or revoked.  Performed at [facility], [address] [place], [place] [address]   Culture, [place] Urine     Status: Abnormal   Collection Time: 10/13/23  5:19 PM   Specimen: Urine, Random  Result Value Ref Range Status   Specimen Description URINE, RANDOM  Final   Special Requests NONE  Final   Culture (A)  Final    <10,000 COLONIES/mL INSIGNIFICANT GROWTH NO GROUP B STREP (S.AGALACTIAE) ISOLATED Performed at [facility], [address], [place], [place] [address]    Report Status 10/15/2023 FINAL  Final  Culture, blood (Routine X 2) w Reflex to ID Panel     Status: Abnormal   Collection Time: 10/13/23  6:53 PM   Specimen: BLOOD  Result Value Ref Range Status   Specimen Description BLOOD SITE NOT SPECIFIED  Final   Special Requests   Final    BOTTLES DRAWN AEROBIC AND ANAEROBIC Blood Culture results may not be optimal due to an inadequate volume of blood received in culture bottles   Culture  Setup Time   Final    GRAM POSITIVE COCCI IN CHAINS IN BOTH AEROBIC AND ANAEROBIC BOTTLES CRITICAL RESULT CALLED TO, READ BACK BY AND VERIFIED WITH: PHARMD CAREN AMEND [id number] BY J RAZZAK,MT Performed at [facility], [address], [place], [place] [address]    Culture STREPTOCOCCUS MUTANS (A)  Final   Report Status 10/16/2023 FINAL  Final   Organism ID, Bacteria STREPTOCOCCUS MUTANS  Final      Susceptibility   Streptococcus mutans - MIC*    PENICILLIN <=0.06 SENSITIVE Sensitive     CEFTRIAXONE <=0.12 SENSITIVE Sensitive     ERYTHROMYCIN <=0.12 SENSITIVE Sensitive     LEVOFLOXACIN 2 SENSITIVE Sensitive     VANCOMYCIN 1 SENSITIVE Sensitive     * STREPTOCOCCUS MUTANS  Blood Culture ID Panel (Reflexed)     Status: Abnormal   Collection Time: 10/13/23  6:53 PM  Result Value Ref  Range  Status   Enterococcus faecalis NOT DETECTED NOT DETECTED Final   Enterococcus Faecium NOT DETECTED NOT DETECTED Final   Listeria monocytogenes NOT DETECTED NOT DETECTED Final   Staphylococcus species NOT DETECTED NOT DETECTED Final   Staphylococcus aureus (BCID) NOT DETECTED NOT DETECTED Final   Staphylococcus epidermidis NOT DETECTED NOT DETECTED Final   Staphylococcus lugdunensis NOT DETECTED NOT DETECTED Final   Streptococcus species DETECTED (A) NOT DETECTED Final    Comment: Not Enterococcus species, Streptococcus agalactiae, Streptococcus pyogenes, or Streptococcus pneumoniae. CRITICAL RESULT CALLED TO, READ BACK BY AND VERIFIED WITH: PHARMD CAREN AMEND [id number] BY J RAZZAK, [place]    Streptococcus agalactiae NOT DETECTED NOT DETECTED Final   Streptococcus pneumoniae NOT DETECTED NOT DETECTED Final   Streptococcus pyogenes NOT DETECTED NOT DETECTED Final   A.calcoaceticus-baumannii NOT DETECTED NOT DETECTED Final   Bacteroides fragilis NOT DETECTED NOT DETECTED Final   Enterobacterales NOT DETECTED NOT DETECTED Final   Enterobacter cloacae complex NOT DETECTED NOT DETECTED Final   Escherichia coli NOT DETECTED NOT DETECTED Final   Klebsiella aerogenes NOT DETECTED NOT DETECTED Final   Klebsiella oxytoca NOT DETECTED NOT DETECTED Final   Klebsiella pneumoniae NOT DETECTED NOT DETECTED Final   Proteus species NOT DETECTED NOT DETECTED Final   Salmonella species NOT DETECTED NOT DETECTED Final   Serratia marcescens NOT DETECTED NOT DETECTED Final   Haemophilus influenzae NOT DETECTED NOT DETECTED Final   Neisseria meningitidis NOT DETECTED NOT DETECTED Final   Pseudomonas aeruginosa NOT DETECTED NOT DETECTED Final   Stenotrophomonas maltophilia NOT DETECTED NOT DETECTED Final   Candida albicans NOT DETECTED NOT DETECTED Final   Candida auris NOT DETECTED NOT DETECTED Final   Candida glabrata NOT DETECTED NOT DETECTED Final   Candida krusei NOT DETECTED NOT DETECTED Final    Candida parapsilosis NOT DETECTED NOT DETECTED Final   Candida tropicalis NOT DETECTED NOT DETECTED Final   Cryptococcus neoformans/gattii NOT DETECTED NOT DETECTED Final    Comment: Performed at [facility], [address], [place], [place] [address]  Culture, blood (Routine X 2) w Reflex to ID Panel     Status: Abnormal   Collection Time: 10/13/23  7:02 PM   Specimen: BLOOD  Result Value Ref Range Status   Specimen Description BLOOD SITE NOT SPECIFIED  Final   Special Requests   Final    BOTTLES DRAWN AEROBIC ONLY Blood Culture results may not be optimal due to an inadequate volume of blood received in culture bottles   Culture  Setup Time   Final    GRAM POSITIVE COCCI IN CHAINS AEROBIC BOTTLE ONLY CRITICAL VALUE NOTED.  VALUE IS CONSISTENT WITH PREVIOUSLY REPORTED AND CALLED VALUE.    Culture (A)  Final    STREPTOCOCCUS MUTANS SUSCEPTIBILITIES PERFORMED ON PREVIOUS CULTURE WITHIN THE LAST 5 DAYS. Performed at [facility], [address] [place], [place] [address]    Report Status 10/16/2023 FINAL  Final  Culture, blood (Routine X 2) w Reflex to ID Panel     Status: None   Collection Time: 10/15/23  2:21 PM   Specimen: BLOOD RIGHT ARM  Result Value Ref Range Status   Specimen Description BLOOD RIGHT ARM  Final   Special Requests   Final    BOTTLES DRAWN AEROBIC AND ANAEROBIC Blood Culture results may not be optimal due to an inadequate volume of blood received in culture bottles   Culture   Final    NO GROWTH 5 DAYS Performed at [facility], [address] [place], [place] [address]    Report Status 10/20/2023 FINAL  Final  Culture, blood (Routine X 2) w Reflex to ID Panel     Status: None   Collection Time: 10/15/23  2:21 PM   Specimen: BLOOD RIGHT HAND  Result Value Ref Range Status   Specimen Description BLOOD RIGHT HAND  Final   Special Requests   Final    BOTTLES DRAWN AEROBIC AND ANAEROBIC Blood Culture adequate volume   Culture   Final     NO GROWTH 5 DAYS Performed at [facility], [address], [place], [place] [address]    Report Status 10/20/2023 FINAL  Final     Time coordinating discharge: Over 30 minutes  SIGNED:   Alvira Philips [place], DO  [facility] 10/20/2023, 9:59 AM

## 2023-10-20 NOTE — Progress Notes (Signed)
 [facility] Patient Discharge Orders- [facility]IC: [facility]   Patient's name: Jacqueline [name] Admit/DC Dates: 10/11/2023 - 10/20/2023  Discharge Diagnoses: Progression of CKD V to ESRD  HD    Acute febrile illness due to UTI and bacteremia:  Blood culture  streptococcal mutans.  Currently on Cefazolin 2g/HD-TTS with end date of 4/12 .  Primary team has consulted ID - repeat cx NGTD.  TEE neg.   Anemia of CKD: - 3/26 iron sat 23%  aranesp 100 mcg every week, monitor lab - Hb < 7, 1u pRBC given 4/3 Hb 6.8 > 9.1    Allergies:  Allergies  No Known Allergies     Date of First Dialysis: 10/11/2023 Cause of renal disease: HTN/DMT2   Dialysis Prescription: Dialysis Frequency: T,Th,S Tx duration: 4 hrs        BFR: 400         DFR: 600 EDW: 80 kg  Dialyzer: 180NRe UF profile/Sodium modeling?: NA Dialysis Bath: 2.0 K     2.5 Ca   Dialysis access: Access type: AVG       Date placed: 05/02/2023        Surgeon: Dr. Hetty Blend Needle gauge: 15 (Had graft angioplasty 7mm balloon 10/18/23) followed by overnight HD    In Center Medications: Heparin Dose: 3000 units                   Type: Porcine VDRA: Patient is currently taking Calcitriol 0.5 mcg PO TID. Please notify Dorene Grebe and order weekly calcium levels.  Venofer: NONE FOR NOW ON  IV ABX Fe  41 Tsat 23% Ferritin 187 10/11/2023  Mircera: per protocol   Next dose due: Rec'd aranesp 100 mcg SQ 10/13/2023  Sensipar: NA   Discharge Labs: Calcium 7.0 Phosphorus 3.3 Albumin 2.4 K+ 3.6    10/20/23   Please draw routine labs. Additional labs needed: Monthly labs on admission. Weekly calcium levels.  Additional notes/follow-up: Please notify Dorene Grebe that patient is taking calcitriol at home 0.5 mcg PO TID   IV Antibiotics: Ancef  2gm q hd  last dose 4/12 /25  Details:    OTHER/APPTS/LAB ORDERS:    D/C Meds to be reconciled by nurse after every discharge.  Completed By:   Reviewed by: MD:______ RN_______

## 2023-10-21 DIAGNOSIS — N186 End stage renal disease: Secondary | ICD-10-CM | POA: Diagnosis not present

## 2023-10-21 DIAGNOSIS — Z992 Dependence on renal dialysis: Secondary | ICD-10-CM | POA: Diagnosis not present

## 2023-10-21 DIAGNOSIS — N2581 Secondary hyperparathyroidism of renal origin: Secondary | ICD-10-CM | POA: Diagnosis not present

## 2023-10-21 LAB — CBC WITH DIFFERENTIAL/PLATELET
Abs Immature Granulocytes: 0.06 10*3/uL (ref 0.00–0.07)
Basophils Absolute: 0.1 10*3/uL (ref 0.0–0.1)
Basophils Relative: 1 %
Eosinophils Absolute: 0.3 10*3/uL (ref 0.0–0.5)
Eosinophils Relative: 3 %
HCT: 27.6 % — ABNORMAL LOW (ref 36.0–46.0)
Hemoglobin: 9.1 g/dL — ABNORMAL LOW (ref 12.0–15.0)
Immature Granulocytes: 1 %
Lymphocytes Relative: 24 %
Lymphs Abs: 2.2 10*3/uL (ref 0.7–4.0)
MCH: 30.6 pg (ref 26.0–34.0)
MCHC: 33 g/dL (ref 30.0–36.0)
MCV: 92.9 fL (ref 80.0–100.0)
Monocytes Absolute: 0.6 10*3/uL (ref 0.1–1.0)
Monocytes Relative: 6 %
Neutro Abs: 6.2 10*3/uL (ref 1.7–7.7)
Neutrophils Relative %: 65 %
Platelets: 230 10*3/uL (ref 150–400)
RBC: 2.97 MIL/uL — ABNORMAL LOW (ref 3.87–5.11)
RDW: 14.7 % (ref 11.5–15.5)
WBC: 9.5 10*3/uL (ref 4.0–10.5)
nRBC: 0 % (ref 0.0–0.2)

## 2023-10-22 ENCOUNTER — Telehealth: Payer: Self-pay | Admitting: Nephrology

## 2023-10-22 NOTE — Telephone Encounter (Signed)
 Transition of care contact from inpatient facility  Date of Discharge: 10/20/23 Date of Contact:attempted 10/22/23 Method of contact: Phone  Attempted to contact patient to discuss transition of care from inpatient admission. Patient did not answer the phone. Message was left on the patient's voicemail with call back number [phone].

## 2023-10-24 DIAGNOSIS — N186 End stage renal disease: Secondary | ICD-10-CM | POA: Diagnosis not present

## 2023-10-24 DIAGNOSIS — N390 Urinary tract infection, site not specified: Secondary | ICD-10-CM | POA: Diagnosis not present

## 2023-10-24 DIAGNOSIS — Z992 Dependence on renal dialysis: Secondary | ICD-10-CM | POA: Diagnosis not present

## 2023-10-24 DIAGNOSIS — N2581 Secondary hyperparathyroidism of renal origin: Secondary | ICD-10-CM | POA: Diagnosis not present

## 2023-10-26 ENCOUNTER — Inpatient Hospital Stay: Payer: Self-pay | Admitting: Infectious Diseases

## 2023-10-26 ENCOUNTER — Telehealth: Payer: Self-pay

## 2023-10-26 DIAGNOSIS — Z992 Dependence on renal dialysis: Secondary | ICD-10-CM | POA: Diagnosis not present

## 2023-10-26 DIAGNOSIS — N2581 Secondary hyperparathyroidism of renal origin: Secondary | ICD-10-CM | POA: Diagnosis not present

## 2023-10-26 DIAGNOSIS — N186 End stage renal disease: Secondary | ICD-10-CM | POA: Diagnosis not present

## 2023-10-26 NOTE — Telephone Encounter (Addendum)
 Attempted to reschedule no show 10/26/23 but was unable to reach patient left message. Patient is getting IV Abx at [facility] [phone]. Called [facility] spoke to Casimiro Needle and confirmed orders given for  EOT IV abx 10/28/23.

## 2023-10-28 DIAGNOSIS — N2581 Secondary hyperparathyroidism of renal origin: Secondary | ICD-10-CM | POA: Diagnosis not present

## 2023-10-28 DIAGNOSIS — Z992 Dependence on renal dialysis: Secondary | ICD-10-CM | POA: Diagnosis not present

## 2023-10-28 DIAGNOSIS — N186 End stage renal disease: Secondary | ICD-10-CM | POA: Diagnosis not present

## 2023-10-31 DIAGNOSIS — N186 End stage renal disease: Secondary | ICD-10-CM | POA: Diagnosis not present

## 2023-10-31 DIAGNOSIS — Z992 Dependence on renal dialysis: Secondary | ICD-10-CM | POA: Diagnosis not present

## 2023-10-31 DIAGNOSIS — N2581 Secondary hyperparathyroidism of renal origin: Secondary | ICD-10-CM | POA: Diagnosis not present

## 2023-11-02 DIAGNOSIS — E1122 Type 2 diabetes mellitus with diabetic chronic kidney disease: Secondary | ICD-10-CM | POA: Diagnosis not present

## 2023-11-02 DIAGNOSIS — N2581 Secondary hyperparathyroidism of renal origin: Secondary | ICD-10-CM | POA: Diagnosis not present

## 2023-11-02 DIAGNOSIS — N186 End stage renal disease: Secondary | ICD-10-CM | POA: Diagnosis not present

## 2023-11-02 DIAGNOSIS — Z992 Dependence on renal dialysis: Secondary | ICD-10-CM | POA: Diagnosis not present

## 2023-11-04 DIAGNOSIS — Z992 Dependence on renal dialysis: Secondary | ICD-10-CM | POA: Diagnosis not present

## 2023-11-04 DIAGNOSIS — N2581 Secondary hyperparathyroidism of renal origin: Secondary | ICD-10-CM | POA: Diagnosis not present

## 2023-11-04 DIAGNOSIS — E1122 Type 2 diabetes mellitus with diabetic chronic kidney disease: Secondary | ICD-10-CM | POA: Diagnosis not present

## 2023-11-04 DIAGNOSIS — N186 End stage renal disease: Secondary | ICD-10-CM | POA: Diagnosis not present

## 2023-11-07 DIAGNOSIS — D631 Anemia in chronic kidney disease: Secondary | ICD-10-CM | POA: Diagnosis not present

## 2023-11-07 DIAGNOSIS — N2581 Secondary hyperparathyroidism of renal origin: Secondary | ICD-10-CM | POA: Diagnosis not present

## 2023-11-07 DIAGNOSIS — Z992 Dependence on renal dialysis: Secondary | ICD-10-CM | POA: Diagnosis not present

## 2023-11-07 DIAGNOSIS — N186 End stage renal disease: Secondary | ICD-10-CM | POA: Diagnosis not present

## 2023-11-09 DIAGNOSIS — D631 Anemia in chronic kidney disease: Secondary | ICD-10-CM | POA: Diagnosis not present

## 2023-11-09 DIAGNOSIS — Z992 Dependence on renal dialysis: Secondary | ICD-10-CM | POA: Diagnosis not present

## 2023-11-09 DIAGNOSIS — N186 End stage renal disease: Secondary | ICD-10-CM | POA: Diagnosis not present

## 2023-11-09 DIAGNOSIS — N2581 Secondary hyperparathyroidism of renal origin: Secondary | ICD-10-CM | POA: Diagnosis not present

## 2023-11-11 DIAGNOSIS — N2581 Secondary hyperparathyroidism of renal origin: Secondary | ICD-10-CM | POA: Diagnosis not present

## 2023-11-11 DIAGNOSIS — D631 Anemia in chronic kidney disease: Secondary | ICD-10-CM | POA: Diagnosis not present

## 2023-11-11 DIAGNOSIS — Z992 Dependence on renal dialysis: Secondary | ICD-10-CM | POA: Diagnosis not present

## 2023-11-11 DIAGNOSIS — N186 End stage renal disease: Secondary | ICD-10-CM | POA: Diagnosis not present

## 2023-11-14 DIAGNOSIS — D631 Anemia in chronic kidney disease: Secondary | ICD-10-CM | POA: Diagnosis not present

## 2023-11-14 DIAGNOSIS — N186 End stage renal disease: Secondary | ICD-10-CM | POA: Diagnosis not present

## 2023-11-14 DIAGNOSIS — Z992 Dependence on renal dialysis: Secondary | ICD-10-CM | POA: Diagnosis not present

## 2023-11-14 DIAGNOSIS — N2581 Secondary hyperparathyroidism of renal origin: Secondary | ICD-10-CM | POA: Diagnosis not present

## 2023-11-15 DIAGNOSIS — Z992 Dependence on renal dialysis: Secondary | ICD-10-CM | POA: Diagnosis not present

## 2023-11-15 DIAGNOSIS — I120 Hypertensive chronic kidney disease with stage 5 chronic kidney disease or end stage renal disease: Secondary | ICD-10-CM | POA: Diagnosis not present

## 2023-11-15 DIAGNOSIS — N186 End stage renal disease: Secondary | ICD-10-CM | POA: Diagnosis not present

## 2023-11-17 ENCOUNTER — Ambulatory Visit (HOSPITAL_COMMUNITY)
Admission: RE | Admit: 2023-11-17 | Discharge: 2023-11-17 | Disposition: A | Discharge status: Home / Self Care | Attending: Nephrology | Admitting: Nephrology

## 2023-11-17 ENCOUNTER — Encounter (HOSPITAL_COMMUNITY)
Admission: RE | Disposition: A | Discharge status: Home / Self Care | Payer: Self-pay | Source: Home / Self Care | Attending: Nephrology

## 2023-11-17 ENCOUNTER — Other Ambulatory Visit: Payer: Self-pay

## 2023-11-17 DIAGNOSIS — Y832 Surgical operation with anastomosis, bypass or graft as the cause of abnormal reaction of the patient, or of later complication, without mention of misadventure at the time of the procedure: Secondary | ICD-10-CM | POA: Insufficient documentation

## 2023-11-17 DIAGNOSIS — N2581 Secondary hyperparathyroidism of renal origin: Secondary | ICD-10-CM | POA: Diagnosis not present

## 2023-11-17 DIAGNOSIS — Z992 Dependence on renal dialysis: Secondary | ICD-10-CM | POA: Diagnosis not present

## 2023-11-17 DIAGNOSIS — E785 Hyperlipidemia, unspecified: Secondary | ICD-10-CM | POA: Diagnosis not present

## 2023-11-17 DIAGNOSIS — T82858A Stenosis of vascular prosthetic devices, implants and grafts, initial encounter: Secondary | ICD-10-CM | POA: Diagnosis not present

## 2023-11-17 DIAGNOSIS — E1122 Type 2 diabetes mellitus with diabetic chronic kidney disease: Secondary | ICD-10-CM | POA: Insufficient documentation

## 2023-11-17 DIAGNOSIS — Z79899 Other long term (current) drug therapy: Secondary | ICD-10-CM | POA: Diagnosis not present

## 2023-11-17 DIAGNOSIS — I132 Hypertensive heart and chronic kidney disease with heart failure and with stage 5 chronic kidney disease, or end stage renal disease: Secondary | ICD-10-CM | POA: Diagnosis not present

## 2023-11-17 DIAGNOSIS — I5032 Chronic diastolic (congestive) heart failure: Secondary | ICD-10-CM | POA: Diagnosis not present

## 2023-11-17 DIAGNOSIS — N186 End stage renal disease: Secondary | ICD-10-CM | POA: Insufficient documentation

## 2023-11-17 DIAGNOSIS — D631 Anemia in chronic kidney disease: Secondary | ICD-10-CM | POA: Insufficient documentation

## 2023-11-17 DIAGNOSIS — I871 Compression of vein: Secondary | ICD-10-CM | POA: Diagnosis not present

## 2023-11-17 HISTORY — PX: A/V SHUNT INTERVENTION: CATH118220

## 2023-11-17 SURGERY — A/V SHUNT INTERVENTION
Anesthesia: LOCAL

## 2023-11-17 MED ORDER — LIDOCAINE HCL (PF) 1 % IJ SOLN
INTRAMUSCULAR | Status: DC | PRN
  Administered 2023-11-17: 2 mL via INTRADERMAL

## 2023-11-17 MED ORDER — HEPARIN (PORCINE) IN NACL 1000-0.9 UT/500ML-% IV SOLN
INTRAVENOUS | Status: DC | PRN
  Administered 2023-11-17: 500 mL

## 2023-11-17 MED ORDER — LIDOCAINE HCL (PF) 1 % IJ SOLN
INTRAMUSCULAR | Status: AC
  Filled 2023-11-17: qty 30

## 2023-11-17 MED ORDER — IODIXANOL 320 MG/ML IV SOLN
INTRAVENOUS | Status: DC | PRN
  Administered 2023-11-17: 8 mL

## 2023-11-17 MED ORDER — SODIUM CHLORIDE 0.9 % IV SOLN: INTRAVENOUS | Status: DC

## 2023-11-17 SURGICAL SUPPLY — 9 items
BAG SNAP BAND KOVER 36X36 (MISCELLANEOUS) ×1 IMPLANT
BALLOON ATHLETIS 8X30X75 (BALLOONS) IMPLANT
COVER DOME SNAP 22 D (MISCELLANEOUS) ×1 IMPLANT
KIT MICROPUNCTURE NIT STIFF (SHEATH) IMPLANT
SHEATH PINNACLE R/O II 6F 4CM (SHEATH) IMPLANT
SYR MEDALLION 10ML (SYRINGE) IMPLANT
SYR MEDALLION 3ML (SYRINGE) IMPLANT
TRAY PV CATH (CUSTOM PROCEDURE TRAY) ×1 IMPLANT
WIRE BENTSON .035X145CM (WIRE) IMPLANT

## 2023-11-17 NOTE — Interval H&P Note (Signed)
 History and Physical Interval Note:  11/17/2023 7:21 AM  Jacqueline [name]  has presented today for surgery, with the diagnosis of difficult cannulation/prolonged cannulation site bleeding.  The various methods of treatment have been discussed with the patient and family. After consideration of risks, benefits and other options for treatment, the patient has consented to  Procedure(s): A/V SHUNT INTERVENTION (N/A) as a surgical intervention.  The patient's history has been reviewed, patient examined, no change in status, stable for surgery.  I have reviewed the patient's chart and labs.  Questions were answered to the patient's satisfaction.    She has a left upper arm AV graft that was created in October 2024 and started dialysis in March 2025.  She just had a fistulogram done by interventional radiology about a month ago with 7 mm venous anastomosis angioplasty.  She has had problems with intermittent cannulation difficulties and at least 2 episodes of prolonged cannulation site bleeding over the past 2 weeks.  Melodie Spry

## 2023-11-17 NOTE — Op Note (Signed)
 Patient presents with concerns of cannulation difficulties and prolonged cannulation site bleeding from her left upper arm straight graft placed in October 2024. Her last endovascular procedure was 7 mm VA angioplasty done at interventional radiology about 1 month ago. On exam the LUA straight AVG is poorly palpable with a poor quality outflow thrill and high-pitched outflow bruit.   Summary:  1) Successful angiogram of a left upper arm straight AVG with evidence of an 80% venous anastomosis treated to 10% with an 8 mm [employer]is angioplasty balloon FE ~16 atm.   2) The body of the graft, left-sided central veins, inflow graft segment and arterial anastomosis were widely patent. 3) This left upper arm straight graft remains amenable to future percutaneous intervention. If lesion recurs in < 3months, would favor stent graft placement.   Description of procedure: The left upper arm was prepped and draped in the usual fashion. The left upper arm straight AVG was cannulated (40981) in the arterial limb of the graft in an antegrade direction with a 21G micropuncture needle and then a 6 Fr sheath was inserted by guidewire exchange technique. The angiogram revealed a patent body of the graft with an 80% venous anastomosis stenosis. The body of the graft, outflow axillary vein and left central veins were patent.  Reflux arteriogram showed a patent inflow graft segment and arterial anastomosis as well as proximal brachial artery.  A guidewire was easily advanced past the outflow stenosis and parked in the central veins. I then advanced an 8 x 30 mm Athletis angioplasty balloon through the antegrade sheath over the guidewire to the level of the venous anastomosis. Venous angioplasty was performed to 100% balloon effacement with approximately 20ATM of pressure via a hand syringe assembly.  Completion angiogram revealed no evidence of extravasation or dissection, more rapid access flows through the graft and 10%  residual stenosis in the venous anastomosis.  Hemostasis: A 3-0 ethilon purse string suture was placed at the cannulation site on removal of the sheath.  Sedation: None (patient drove herself)  Sedation time: Not applicable.  Contrast.  8 mL  Monitoring: Because of the patient's comorbid conditions and sedation during the procedure, continuous EKG monitoring and O2 saturation monitoring was performed throughout the procedure by the RN. There were no abnormal arrhythmias encountered.  Complications: None.   Diagnoses: I87.1 Stricture of vein  N18.6 ESRD T82.858A Stricture of access  Procedure Coding:  [phone] Cannulation and angiogram of fistula, venous angioplasty (AVG VA) W2956 Contrast  Recommendations:  1. Continue to cannulate the fistula with 15G needles.  2. Refer back for problems with flows/cannulation. 3. Remove the suture next treatment.   Discharge: The patient was discharged home in stable condition. The patient was given education regarding the care of the dialysis access AVG and specific instructions in case of any problems.

## 2023-11-18 ENCOUNTER — Encounter (HOSPITAL_COMMUNITY): Payer: Self-pay | Admitting: Nephrology

## 2023-11-18 DIAGNOSIS — N186 End stage renal disease: Secondary | ICD-10-CM | POA: Diagnosis not present

## 2023-11-18 DIAGNOSIS — Z992 Dependence on renal dialysis: Secondary | ICD-10-CM | POA: Diagnosis not present

## 2023-11-18 DIAGNOSIS — N2581 Secondary hyperparathyroidism of renal origin: Secondary | ICD-10-CM | POA: Diagnosis not present

## 2023-11-18 DIAGNOSIS — D631 Anemia in chronic kidney disease: Secondary | ICD-10-CM | POA: Diagnosis not present

## 2023-11-21 DIAGNOSIS — N2581 Secondary hyperparathyroidism of renal origin: Secondary | ICD-10-CM | POA: Diagnosis not present

## 2023-11-21 DIAGNOSIS — Z992 Dependence on renal dialysis: Secondary | ICD-10-CM | POA: Diagnosis not present

## 2023-11-21 DIAGNOSIS — N186 End stage renal disease: Secondary | ICD-10-CM | POA: Diagnosis not present

## 2023-11-21 DIAGNOSIS — D631 Anemia in chronic kidney disease: Secondary | ICD-10-CM | POA: Diagnosis not present

## 2023-11-23 DIAGNOSIS — N186 End stage renal disease: Secondary | ICD-10-CM | POA: Diagnosis not present

## 2023-11-23 DIAGNOSIS — N2581 Secondary hyperparathyroidism of renal origin: Secondary | ICD-10-CM | POA: Diagnosis not present

## 2023-11-23 DIAGNOSIS — Z992 Dependence on renal dialysis: Secondary | ICD-10-CM | POA: Diagnosis not present

## 2023-11-23 DIAGNOSIS — D631 Anemia in chronic kidney disease: Secondary | ICD-10-CM | POA: Diagnosis not present

## 2023-11-25 DIAGNOSIS — D631 Anemia in chronic kidney disease: Secondary | ICD-10-CM | POA: Diagnosis not present

## 2023-11-25 DIAGNOSIS — N186 End stage renal disease: Secondary | ICD-10-CM | POA: Diagnosis not present

## 2023-11-25 DIAGNOSIS — Z992 Dependence on renal dialysis: Secondary | ICD-10-CM | POA: Diagnosis not present

## 2023-11-25 DIAGNOSIS — N2581 Secondary hyperparathyroidism of renal origin: Secondary | ICD-10-CM | POA: Diagnosis not present

## 2023-11-28 ENCOUNTER — Other Ambulatory Visit (HOSPITAL_COMMUNITY): Payer: Self-pay | Admitting: Internal Medicine

## 2023-11-28 DIAGNOSIS — N186 End stage renal disease: Secondary | ICD-10-CM

## 2023-11-28 DIAGNOSIS — Z992 Dependence on renal dialysis: Secondary | ICD-10-CM | POA: Diagnosis not present

## 2023-11-28 DIAGNOSIS — N2581 Secondary hyperparathyroidism of renal origin: Secondary | ICD-10-CM | POA: Diagnosis not present

## 2023-11-28 DIAGNOSIS — D631 Anemia in chronic kidney disease: Secondary | ICD-10-CM | POA: Diagnosis not present

## 2023-11-29 ENCOUNTER — Other Ambulatory Visit: Payer: Self-pay

## 2023-11-29 ENCOUNTER — Ambulatory Visit (HOSPITAL_COMMUNITY)
Admission: RE | Admit: 2023-11-29 | Discharge: 2023-11-29 | Disposition: A | Discharge status: Home / Self Care | Source: Ambulatory Visit | Attending: Internal Medicine | Admitting: Internal Medicine

## 2023-11-29 ENCOUNTER — Other Ambulatory Visit (HOSPITAL_COMMUNITY): Payer: Self-pay | Admitting: Internal Medicine

## 2023-11-29 VITALS — BP 143/83 | HR 77 | Temp 98.3°F | Resp 20 | Ht 64.0 in | Wt 172.0 lb

## 2023-11-29 DIAGNOSIS — E1122 Type 2 diabetes mellitus with diabetic chronic kidney disease: Secondary | ICD-10-CM | POA: Insufficient documentation

## 2023-11-29 DIAGNOSIS — Y832 Surgical operation with anastomosis, bypass or graft as the cause of abnormal reaction of the patient, or of later complication, without mention of misadventure at the time of the procedure: Secondary | ICD-10-CM | POA: Diagnosis not present

## 2023-11-29 DIAGNOSIS — I120 Hypertensive chronic kidney disease with stage 5 chronic kidney disease or end stage renal disease: Secondary | ICD-10-CM | POA: Insufficient documentation

## 2023-11-29 DIAGNOSIS — G4733 Obstructive sleep apnea (adult) (pediatric): Secondary | ICD-10-CM | POA: Diagnosis not present

## 2023-11-29 DIAGNOSIS — Z992 Dependence on renal dialysis: Secondary | ICD-10-CM | POA: Diagnosis not present

## 2023-11-29 DIAGNOSIS — N186 End stage renal disease: Secondary | ICD-10-CM | POA: Insufficient documentation

## 2023-11-29 DIAGNOSIS — Z6829 Body mass index (BMI) 29.0-29.9, adult: Secondary | ICD-10-CM | POA: Insufficient documentation

## 2023-11-29 DIAGNOSIS — T82398A Other mechanical complication of other vascular grafts, initial encounter: Secondary | ICD-10-CM | POA: Diagnosis not present

## 2023-11-29 DIAGNOSIS — T82898A Other specified complication of vascular prosthetic devices, implants and grafts, initial encounter: Secondary | ICD-10-CM | POA: Diagnosis present

## 2023-11-29 DIAGNOSIS — Z01818 Encounter for other preprocedural examination: Secondary | ICD-10-CM

## 2023-11-29 HISTORY — PX: IR DIALY SHUNT INTRO NEEDLE/INTRACATH INITIAL W/IMG LEFT: IMG6102

## 2023-11-29 LAB — CBC
HCT: 28.1 % — ABNORMAL LOW (ref 36.0–46.0)
Hemoglobin: 9 g/dL — ABNORMAL LOW (ref 12.0–15.0)
MCH: 31.6 pg (ref 26.0–34.0)
MCHC: 32 g/dL (ref 30.0–36.0)
MCV: 98.6 fL (ref 80.0–100.0)
Platelets: 317 10*3/uL (ref 150–400)
RBC: 2.85 MIL/uL — ABNORMAL LOW (ref 3.87–5.11)
RDW: 16.5 % — ABNORMAL HIGH (ref 11.5–15.5)
WBC: 7.6 10*3/uL (ref 4.0–10.5)
nRBC: 0 % (ref 0.0–0.2)

## 2023-11-29 MED ORDER — LIDOCAINE-EPINEPHRINE 1 %-1:100000 IJ SOLN
INTRAMUSCULAR | Status: AC
Start: 2023-11-29 — End: ?
  Filled 2023-11-29: qty 1

## 2023-11-29 MED ORDER — LIDOCAINE-EPINEPHRINE 1 %-1:100000 IJ SOLN
20.0000 mL | Freq: Once | INTRAMUSCULAR | Status: AC
  Administered 2023-11-29: 10 mL via INTRADERMAL

## 2023-11-29 MED ORDER — FENTANYL CITRATE (PF) 100 MCG/2ML IJ SOLN
INTRAMUSCULAR | Status: AC
  Filled 2023-11-29: qty 2

## 2023-11-29 MED ORDER — MIDAZOLAM HCL 2 MG/2ML IJ SOLN
INTRAMUSCULAR | Status: AC
  Filled 2023-11-29: qty 2

## 2023-11-29 MED ORDER — HEPARIN SODIUM (PORCINE) 1000 UNIT/ML IJ SOLN
INTRAMUSCULAR | Status: AC
Start: 2023-11-29 — End: ?
  Filled 2023-11-29: qty 10

## 2023-11-29 MED ORDER — HEPARIN SODIUM (PORCINE) 1000 UNIT/ML IJ SOLN
INTRAMUSCULAR | Status: AC | PRN
  Administered 2023-11-29: 3000 [IU] via INTRAVENOUS

## 2023-11-29 MED ORDER — IOHEXOL 300 MG/ML  SOLN
100.0000 mL | Freq: Once | INTRAMUSCULAR | Status: AC | PRN
  Administered 2023-11-29: 50 mL via INTRAVENOUS

## 2023-11-29 NOTE — Procedures (Signed)
 Interventional Radiology Procedure Note  Procedure:   US  guided access LUE graft/dialysis circuit.  Fistulagram.   Findings: Not thrombosed.  Flowing well.  No narrowing/stenosis.  No pseudoaneurysm.  Nothing treatable.   Complications: None  Recommendations:  - OK to use/cannulate - Do not submerge for 7 days - Routine wound care  -Duplex may be useful to determine flow rate/volume, as well as depth of the graft  Signed,  Marciano Settles. Mabel Savage, DO

## 2023-11-29 NOTE — H&P (Signed)
 Chief Complaint: Patient was seen in consultation today for vascular access problem, with consideration for AV graft declot.  Referring Provider(s): Dr. Rowan Cooter, MD   Supervising Physician: Myrlene Asper  Patient Status: [facility] - Out-pt  Patient is Full Code  History of Present Illness: Jacqueline [name] is a 43 y.o. female  with PMHx notable for HTN, T2DM, ESRD (TTS HD schedule), OSA, morbid obesity, anemia, headaches, and anxiety.  Patient is know to IR service, having most recently undergone angioplasty of AVG on 4/3 by Dr. Marlena Sima.   Patient with history of ESRD, with AVG placement on 04/2023 (Dr. Susi Eric). Patient had difficult cannulation concerns with prolonged bleeding times, most recently necessitating angioplasty on 5/2 by Dr. Clevester Dally, MD. Per review of Operative note on 5/2: "Patient presents with concerns of cannulation difficulties and prolonged cannulation site bleeding from her left upper arm straight graft placed in October 2024. Her last endovascular procedure was 7 mm VA angioplasty done at interventional radiology about 1 month ago. On exam the LUA straight AVG is poorly palpable with a poor quality outflow thrill and high-pitched outflow bruit.    Summary:  1)Successful angiogram of a left upper arm straight AVG with evidence of an 80% venous anastomosis treated to 10% with an 8 mm Athletis angioplasty balloon FE ~16 atm.   2)The body of the graft, left-sided central veins, inflow graft segment and arterial anastomosis were widely patent. 3)This left upper arm straight graft remains amenable to future percutaneous intervention. If lesion recurs in < 3months, would favor stent graft placement. "  Patient remains with concerns for difficult access. Interventional Radiology was requested for fistulogram with possible intervention. Patient is scheduled for same in IR today.   Patient is currently without any significant complaints. Patient is alert and laying in  bed, calm. Patient denies any fevers, headache, chest pain, SOB, cough, abdominal pain, nausea, vomiting or bleeding.     Past Medical History:  Diagnosis Date   Anemia    Anxiety    Chronic bronchitis (HCC)    Chronic kidney disease    Hypertension    Increased frequency of headaches    Morbid obesity (HCC)    Necrotizing fasciitis (HCC)    Sleep apnea    does not use CPAP - - gastric bypass   Type II diabetes mellitus (HCC)    no meds, diet controlled    Past Surgical History:  Procedure Laterality Date   A/V SHUNT INTERVENTION N/A 11/17/2023   Procedure: A/V SHUNT INTERVENTION;  Surgeon: Melodie Spry, MD;  Location: [facility] INVASIVE CV LAB;  Service: Cardiovascular;  Laterality: N/A;  80% venous anastomosis   AV FISTULA PLACEMENT Left 05/02/2023   Procedure: LEFT ARM ARTERIOVENOUS (AV) FISTULA  GRAFT;  Surgeon: Philipp Brawn, MD;  Location: [facility] OR;  Service: Vascular;  Laterality: Left;   BARIATRIC SURGERY     BREAST REDUCTION SURGERY  03/21/2017   CARDIAC CATHETERIZATION  01/06/2016   CARDIAC CATHETERIZATION N/A 01/06/2016   Procedure: Left Heart Cath and Coronary Angiography;  Surgeon: Wenona Hamilton, MD;  Location: [facility] INVASIVE CV LAB;  Service: Cardiovascular;  Laterality: N/A;   CESAREAN SECTION  08/2014   I & D EXTREMITY Right 06/18/2022   Procedure: IRRIGATION AND DEBRIDEMENT RIGHT ABDOMEN AND THIGH;  Surgeon: Kinsinger, Alphonso Aschoff, MD;  Location: [facility] OR;  Service: General;  Laterality: Right;   I & D EXTREMITY Right 06/20/2022   Procedure: WOUND EXPLORATION OF RIGHT THIGH AND RIGHT GROIN  WITH IRRIGATION AND DEBRIDEMENT;  Surgeon: Caralyn Chandler, MD;  Location: [facility] OR;  Service: General;  Laterality: Right;   IR AV DIALY SHUNT INTRO NEEDLE/INTRACATH INITIAL W/PTA/IMG LEFT  10/19/2023   REDUCTION MAMMAPLASTY Bilateral 03/21/2017   TRANSESOPHAGEAL ECHOCARDIOGRAM (CATH LAB) N/A 10/16/2023   Procedure: TRANSESOPHAGEAL ECHOCARDIOGRAM;  Surgeon: Harrold Lincoln, MD;  Location:  [facility] INVASIVE CV LAB;  Service: Cardiovascular;  Laterality: N/A;    Allergies: Patient has no known allergies.  Medications: Prior to Admission medications   Medication Sig Start Date End Date Taking? Authorizing Provider  calcitRIOL  (ROCALTROL ) 0.5 MCG capsule Take 0.5 mcg by mouth daily. 10/03/23   [provider]  furosemide  (LASIX ) 40 MG tablet Take 40 mg by mouth 2 (two) times daily. 11/15/23   [provider]  metoprolol  tartrate (LOPRESSOR ) 25 MG tablet Take 1 tablet (25 mg total) by mouth 2 (two) times daily. 10/20/23 01/18/24  [place], Rema Care, DO  Vitamin D , Ergocalciferol , (DRISDOL ) 1.25 MG (50000 UNIT) CAPS capsule Take 1 capsule (50,000 Units total) by mouth every Thursday. Patient not taking: Reported on 11/16/2023 07/07/22   Angiulli, Everlyn Hockey, PA-C     Family History  Problem Relation Age of Onset   Diabetes Mother    Hypertension Mother    Thyroid  disease Mother    Kidney disease Maternal Grandmother    Diabetes Maternal Grandmother    Heart attack Other     Social History   Socioeconomic History   Marital status: Married    Spouse name: Not on file   Number of children: Not on file   Years of education: Not on file   Highest education level: Not on file  Occupational History   Not on file  Tobacco Use   Smoking status: Never   Smokeless tobacco: Never  Vaping Use   Vaping status: Never Used  Substance and Sexual Activity   Alcohol use: No   Drug use: No   Sexual activity: Yes    Birth control/protection: None  Other Topics Concern   Not on file  Social History Narrative   Not on file   Social Drivers of Health   Financial Resource Strain: Not on file  Food Insecurity: No Food Insecurity (10/11/2023)   Hunger Vital Sign    Worried About Running Out of Food in the Last Year: Never true    Ran Out of Food in the Last Year: Never true  Transportation Needs: No Transportation Needs (10/11/2023)   PRAPARE - Scientist, research (physical sciences) (Medical): No    Lack of Transportation (Non-Medical): No  Physical Activity: Not on file  Stress: Not on file  Social Connections: Socially Integrated (10/11/2023)   Social Connection and Isolation Panel [NHANES]    Frequency of Communication with Friends and Family: More than three times a week    Frequency of Social Gatherings with Friends and Family: More than three times a week    Attends Religious Services: 1 to 4 times per year    Active Member of [employer] or Organizations: Yes    Attends Engineer, structural: More than 4 times per year    Marital Status: Married     Review of Systems: A 12 point ROS discussed and pertinent positives are indicated in the HPI above.  All other systems are negative.  Vital Signs: There were no vitals taken for this visit.  Advance Care Plan: The advanced care place/surrogate decision maker was discussed at the time of  visit and the patient did not wish to discuss or was not able to name a surrogate decision maker or provide an advance care plan.  Physical Exam Constitutional:      General: She is not in acute distress.    Appearance: Normal appearance.  HENT:     Mouth/Throat:     Mouth: Mucous membranes are dry.  Cardiovascular:     Rate and Rhythm: Normal rate and regular rhythm.     Pulses: Normal pulses.     Heart sounds: No murmur heard. Pulmonary:     Effort: Pulmonary effort is normal.     Breath sounds: Normal breath sounds. No wheezing.  Musculoskeletal:        General: Normal range of motion.     Cervical back: Normal range of motion.  Skin:    General: Skin is warm and dry.  Neurological:     Mental Status: She is alert and oriented to person, place, and time.  Psychiatric:        Mood and Affect: Mood normal.        Behavior: Behavior normal.        Thought Content: Thought content normal.        Judgment: Judgment normal.     Imaging: PERIPHERAL VASCULAR CATHETERIZATION Result Date: 11/17/2023    Okay to discharge home anytime after 8:20 AM as long as clinically stable   No indication for antiplatelet therapy at this time . Patient presents with concerns of cannulation difficulties and prolonged cannulation site bleeding from her left upper arm straight graft placed in October 2024. Her last endovascular procedure was 7 mm VA angioplasty done at interventional radiology about 1 month ago. On exam the LUA straight AVG is poorly palpable with a poor quality outflow thrill and high-pitched outflow bruit. Summary: 1) Successful angiogram of a left upper arm straight AVG with evidence of an 80% venous anastomosis treated to 10% with an 8 mm Athletis angioplasty balloon FE ~16 atm.  2) The body of the graft, left-sided central veins, inflow graft segment and arterial anastomosis were widely patent. 3) This left upper arm straight graft remains amenable to future percutaneous intervention. If lesion recurs in < 3months, would favor stent graft placement. Description of procedure: The left upper arm was prepped and draped in the usual fashion. The left upper arm straight AVG was cannulated (16109) in the arterial limb of the graft in an antegrade direction with a 21G micropuncture needle and then a 6 Fr sheath was inserted by guidewire exchange technique. The angiogram revealed a patent body of the graft with an 80% venous anastomosis stenosis. The body of the graft, outflow axillary vein and left central veins were patent.  Reflux arteriogram showed a patent inflow graft segment and arterial anastomosis as well as proximal brachial artery. A guidewire was easily advanced past the outflow stenosis and parked in the central veins. I then advanced an 8 x 30 mm Athletis angioplasty balloon through the antegrade sheath over the guidewire to the level of the venous anastomosis. Venous angioplasty was performed to 100% balloon effacement with approximately 20ATM of pressure via a hand syringe assembly.  Completion  angiogram revealed no evidence of extravasation or dissection, more rapid access flows through the graft and 10% residual stenosis in the venous anastomosis. Hemostasis: A 3-0 ethilon purse string suture was placed at the cannulation site on removal of the sheath. Sedation: None (patient drove herself) Sedation time: Not applicable. Contrast.  8 mL Monitoring: Because  of the patient's comorbid conditions and sedation during the procedure, continuous EKG monitoring and O2 saturation monitoring was performed throughout the procedure by the RN. There were no abnormal arrhythmias encountered. Complications: None. Diagnoses: I87.1 Stricture of vein N18.6 ESRD T82.858A Stricture of access Procedure Coding: 16109 Cannulation and angiogram of fistula, venous angioplasty (AVG VA) U0454 Contrast Recommendations: 1. Continue to cannulate the fistula with 15G needles. 2. Refer back for problems with flows/cannulation. 3. Remove the suture next treatment. Discharge: The patient was discharged home in stable condition. The patient was given education regarding the care of the dialysis access AVG and specific instructions in case of any problems.    Labs:  CBC: Recent Labs    10/17/23 0401 10/18/23 0251 10/19/23 0313 10/20/23 0308  WBC 8.8 8.7 7.1 9.5  HGB 7.4* 7.4* 6.8* 9.1*  HCT 23.7* 23.4* 21.4* 27.6*  PLT 260 246 245 230    COAGS: No results for input(s): "INR", "APTT" in the last 8760 hours.  BMP: Recent Labs    10/17/23 0401 10/18/23 0251 10/19/23 0313 10/20/23 0308  NA 141 139 139 138  K 4.5 3.9 4.1 3.6  CL 107 103 105 103  CO2 22 26 25 25   GLUCOSE 151* 140* 162* 170*  BUN 49* 28* 35* 20  CALCIUM  6.6* 6.7* 6.2* 7.0*  CREATININE 5.97* 4.03* 4.99* 2.78*  GFRNONAA 8* 13* 10* 21*    LIVER FUNCTION TESTS: Recent Labs    10/17/23 0401 10/18/23 0251 10/19/23 0313 10/20/23 0308  ALBUMIN  2.4* 2.2* 2.2* 2.4*    TUMOR MARKERS: No results for input(s): "AFPTM", "CEA", "CA199",  "CHROMGRNA" in the last 8760 hours.  Assessment and Plan: Patient with history of ESRD, with AVG placement on 04/2023 (Dr. Susi Eric). Patient had difficult cannulation concerns with prolonged bleeding times, most recently necessitating angioplasty on 5/2 by Dr. Clevester Dally, MD. Unfortunately, patient remains with concerns for difficult access. Patient presents for scheduled fistulogram and possible declot in IR today.  All labs and medications are within acceptable parameters. No pertinent allergies. Patient has been NPO since midnight.   Risks and benefits discussed with the patient including, but not limited to bleeding, infection, vascular injury, pulmonary embolism, need for tunneled HD catheter placement or even death.  All of the patient's questions were answered, patient is agreeable to proceed. Consent signed and in chart.      Thank you for allowing our service to participate in [facility] 's care.  Electronically Signed: Lovena Rubinstein, PA-C   11/29/2023, 6:16 AM      I spent a total of 15 Minutes in face to face in clinical consultation, greater than 50% of which was counseling/coordinating care for vascular access problem, with consideration for AV graft declot.

## 2023-11-30 DIAGNOSIS — N2581 Secondary hyperparathyroidism of renal origin: Secondary | ICD-10-CM | POA: Diagnosis not present

## 2023-11-30 DIAGNOSIS — D631 Anemia in chronic kidney disease: Secondary | ICD-10-CM | POA: Diagnosis not present

## 2023-11-30 DIAGNOSIS — Z992 Dependence on renal dialysis: Secondary | ICD-10-CM | POA: Diagnosis not present

## 2023-11-30 DIAGNOSIS — N186 End stage renal disease: Secondary | ICD-10-CM | POA: Diagnosis not present

## 2023-12-02 DIAGNOSIS — Z992 Dependence on renal dialysis: Secondary | ICD-10-CM | POA: Diagnosis not present

## 2023-12-02 DIAGNOSIS — N186 End stage renal disease: Secondary | ICD-10-CM | POA: Diagnosis not present

## 2023-12-02 DIAGNOSIS — D631 Anemia in chronic kidney disease: Secondary | ICD-10-CM | POA: Diagnosis not present

## 2023-12-02 DIAGNOSIS — N2581 Secondary hyperparathyroidism of renal origin: Secondary | ICD-10-CM | POA: Diagnosis not present

## 2023-12-07 DIAGNOSIS — N2581 Secondary hyperparathyroidism of renal origin: Secondary | ICD-10-CM | POA: Diagnosis not present

## 2023-12-07 DIAGNOSIS — Z992 Dependence on renal dialysis: Secondary | ICD-10-CM | POA: Diagnosis not present

## 2023-12-07 DIAGNOSIS — N186 End stage renal disease: Secondary | ICD-10-CM | POA: Diagnosis not present

## 2023-12-07 DIAGNOSIS — D631 Anemia in chronic kidney disease: Secondary | ICD-10-CM | POA: Diagnosis not present

## 2023-12-07 DIAGNOSIS — E1122 Type 2 diabetes mellitus with diabetic chronic kidney disease: Secondary | ICD-10-CM | POA: Diagnosis not present

## 2023-12-09 DIAGNOSIS — E1122 Type 2 diabetes mellitus with diabetic chronic kidney disease: Secondary | ICD-10-CM | POA: Diagnosis not present

## 2023-12-09 DIAGNOSIS — N186 End stage renal disease: Secondary | ICD-10-CM | POA: Diagnosis not present

## 2023-12-09 DIAGNOSIS — D631 Anemia in chronic kidney disease: Secondary | ICD-10-CM | POA: Diagnosis not present

## 2023-12-09 DIAGNOSIS — Z992 Dependence on renal dialysis: Secondary | ICD-10-CM | POA: Diagnosis not present

## 2023-12-09 DIAGNOSIS — N2581 Secondary hyperparathyroidism of renal origin: Secondary | ICD-10-CM | POA: Diagnosis not present

## 2023-12-12 DIAGNOSIS — N2581 Secondary hyperparathyroidism of renal origin: Secondary | ICD-10-CM | POA: Diagnosis not present

## 2023-12-12 DIAGNOSIS — Z992 Dependence on renal dialysis: Secondary | ICD-10-CM | POA: Diagnosis not present

## 2023-12-12 DIAGNOSIS — N186 End stage renal disease: Secondary | ICD-10-CM | POA: Diagnosis not present

## 2023-12-14 DIAGNOSIS — N186 End stage renal disease: Secondary | ICD-10-CM | POA: Diagnosis not present

## 2023-12-14 DIAGNOSIS — N2581 Secondary hyperparathyroidism of renal origin: Secondary | ICD-10-CM | POA: Diagnosis not present

## 2023-12-14 DIAGNOSIS — Z992 Dependence on renal dialysis: Secondary | ICD-10-CM | POA: Diagnosis not present

## 2023-12-16 DIAGNOSIS — Z992 Dependence on renal dialysis: Secondary | ICD-10-CM | POA: Diagnosis not present

## 2023-12-16 DIAGNOSIS — I120 Hypertensive chronic kidney disease with stage 5 chronic kidney disease or end stage renal disease: Secondary | ICD-10-CM | POA: Diagnosis not present

## 2023-12-16 DIAGNOSIS — N186 End stage renal disease: Secondary | ICD-10-CM | POA: Diagnosis not present

## 2023-12-16 DIAGNOSIS — N2581 Secondary hyperparathyroidism of renal origin: Secondary | ICD-10-CM | POA: Diagnosis not present

## 2023-12-19 DIAGNOSIS — N2581 Secondary hyperparathyroidism of renal origin: Secondary | ICD-10-CM | POA: Diagnosis not present

## 2023-12-19 DIAGNOSIS — Z992 Dependence on renal dialysis: Secondary | ICD-10-CM | POA: Diagnosis not present

## 2023-12-19 DIAGNOSIS — N186 End stage renal disease: Secondary | ICD-10-CM | POA: Diagnosis not present

## 2023-12-19 DIAGNOSIS — D631 Anemia in chronic kidney disease: Secondary | ICD-10-CM | POA: Diagnosis not present

## 2023-12-30 ENCOUNTER — Other Ambulatory Visit: Payer: Self-pay

## 2023-12-30 ENCOUNTER — Emergency Department (HOSPITAL_COMMUNITY)
Admission: EM | Admit: 2023-12-30 | Discharge: 2023-12-30 | Disposition: A | Discharge status: Home / Self Care | Attending: Emergency Medicine | Admitting: Emergency Medicine

## 2023-12-30 ENCOUNTER — Emergency Department (HOSPITAL_COMMUNITY)

## 2023-12-30 ENCOUNTER — Encounter (HOSPITAL_COMMUNITY): Payer: Self-pay

## 2023-12-30 DIAGNOSIS — Z79899 Other long term (current) drug therapy: Secondary | ICD-10-CM | POA: Diagnosis not present

## 2023-12-30 DIAGNOSIS — D631 Anemia in chronic kidney disease: Secondary | ICD-10-CM | POA: Diagnosis not present

## 2023-12-30 DIAGNOSIS — R112 Nausea with vomiting, unspecified: Secondary | ICD-10-CM | POA: Diagnosis not present

## 2023-12-30 DIAGNOSIS — R0602 Shortness of breath: Secondary | ICD-10-CM | POA: Insufficient documentation

## 2023-12-30 DIAGNOSIS — I120 Hypertensive chronic kidney disease with stage 5 chronic kidney disease or end stage renal disease: Secondary | ICD-10-CM | POA: Diagnosis not present

## 2023-12-30 DIAGNOSIS — R1110 Vomiting, unspecified: Secondary | ICD-10-CM | POA: Diagnosis not present

## 2023-12-30 DIAGNOSIS — E1122 Type 2 diabetes mellitus with diabetic chronic kidney disease: Secondary | ICD-10-CM | POA: Diagnosis not present

## 2023-12-30 DIAGNOSIS — R509 Fever, unspecified: Secondary | ICD-10-CM | POA: Insufficient documentation

## 2023-12-30 DIAGNOSIS — R531 Weakness: Secondary | ICD-10-CM | POA: Diagnosis not present

## 2023-12-30 DIAGNOSIS — Z992 Dependence on renal dialysis: Secondary | ICD-10-CM | POA: Insufficient documentation

## 2023-12-30 DIAGNOSIS — N250 Renal osteodystrophy: Secondary | ICD-10-CM | POA: Diagnosis not present

## 2023-12-30 DIAGNOSIS — N186 End stage renal disease: Secondary | ICD-10-CM | POA: Insufficient documentation

## 2023-12-30 DIAGNOSIS — R0600 Dyspnea, unspecified: Secondary | ICD-10-CM | POA: Diagnosis not present

## 2023-12-30 LAB — COMPREHENSIVE METABOLIC PANEL WITH GFR
ALT: 13 U/L (ref 0–44)
AST: 14 U/L — ABNORMAL LOW (ref 15–41)
Albumin: 3.6 g/dL (ref 3.5–5.0)
Alkaline Phosphatase: 90 U/L (ref 38–126)
BUN: 143 mg/dL — ABNORMAL HIGH (ref 6–20)
CO2: <7 mmol/L — ABNORMAL LOW (ref 22–32)
Calcium: 7.2 mg/dL — ABNORMAL LOW (ref 8.9–10.3)
Chloride: 108 mmol/L (ref 98–111)
Creatinine, Ser: 15.4 mg/dL — ABNORMAL HIGH (ref 0.44–1.00)
GFR, Estimated: 3 mL/min — ABNORMAL LOW (ref >60)
Glucose, Bld: 101 mg/dL — ABNORMAL HIGH (ref 70–99)
Potassium: 3.6 mmol/L (ref 3.5–5.1)
Sodium: 137 mmol/L (ref 135–145)
Total Bilirubin: 0.9 mg/dL (ref 0.0–1.2)
Total Protein: 7.7 g/dL (ref 6.5–8.1)

## 2023-12-30 LAB — I-STAT CHEM 8, ED
BUN: >130 mg/dL — ABNORMAL HIGH (ref 6–20)
Calcium, Ion: 0.88 mmol/L — CL (ref 1.15–1.40)
Chloride: 116 mmol/L — ABNORMAL HIGH (ref 98–111)
Creatinine, Ser: 16.7 mg/dL — ABNORMAL HIGH (ref 0.44–1.00)
Glucose, Bld: 89 mg/dL (ref 70–99)
HCT: 25 % — ABNORMAL LOW (ref 36.0–46.0)
Hemoglobin: 8.5 g/dL — ABNORMAL LOW (ref 12.0–15.0)
Potassium: 3.3 mmol/L — ABNORMAL LOW (ref 3.5–5.1)
Sodium: 141 mmol/L (ref 135–145)
TCO2: 9 mmol/L — ABNORMAL LOW (ref 22–32)

## 2023-12-30 LAB — CBC WITH DIFFERENTIAL/PLATELET
Abs Immature Granulocytes: 0 10*3/uL (ref 0.00–0.07)
Basophils Absolute: 0.2 10*3/uL — ABNORMAL HIGH (ref 0.0–0.1)
Basophils Relative: 3 %
Eosinophils Absolute: 0.2 10*3/uL (ref 0.0–0.5)
Eosinophils Relative: 3 %
HCT: 28.4 % — ABNORMAL LOW (ref 36.0–46.0)
Hemoglobin: 9.9 g/dL — ABNORMAL LOW (ref 12.0–15.0)
Lymphocytes Relative: 20 %
Lymphs Abs: 1.4 10*3/uL (ref 0.7–4.0)
MCH: 31.8 pg (ref 26.0–34.0)
MCHC: 34.9 g/dL (ref 30.0–36.0)
MCV: 91.3 fL (ref 80.0–100.0)
Monocytes Absolute: 0.6 10*3/uL (ref 0.1–1.0)
Monocytes Relative: 9 %
Neutro Abs: 4.5 10*3/uL (ref 1.7–7.7)
Neutrophils Relative %: 65 %
Platelets: 291 10*3/uL (ref 150–400)
RBC: 3.11 MIL/uL — ABNORMAL LOW (ref 3.87–5.11)
RDW: 15.9 % — ABNORMAL HIGH (ref 11.5–15.5)
WBC: 6.9 10*3/uL (ref 4.0–10.5)
nRBC: 0 % (ref 0.0–0.2)
nRBC: 0 /100{WBCs}

## 2023-12-30 LAB — HEPATITIS B SURFACE ANTIGEN: Hepatitis B Surface Ag: NONREACTIVE

## 2023-12-30 MED ORDER — CHLORHEXIDINE GLUCONATE CLOTH 2 % EX PADS: 6.0000 | MEDICATED_PAD | Freq: Every day | CUTANEOUS | Status: DC

## 2023-12-30 MED ORDER — ACETAMINOPHEN 500 MG PO TABS
1000.0000 mg | ORAL_TABLET | Freq: Once | ORAL | Status: AC
  Administered 2023-12-30: 1000 mg via ORAL
  Filled 2023-12-30: qty 2

## 2023-12-30 NOTE — ED Triage Notes (Signed)
 Pt to ED via [facility] from home. Pt returned from [place] two days ago. Pt was in [place] for 8 days, pt was unable to get dialysis in [place]. Pt has had fever, vomiting, chills, and shortness of breath for past 4 days. Pt states she has been confused for past 2 days and feels like she has a burning sensation all over. Pt states it feels like she has pins/needles on the inside. Pt sent from dialysis center today, did not receive dialysis.  Pt A&Ox4 on arrival.   EMS VS:  140/70 106 Cbg 132

## 2023-12-30 NOTE — ED Notes (Signed)
 Consent form signed for hemodialysis.

## 2023-12-30 NOTE — ED Provider Notes (Signed)
 [place] EMERGENCY DEPARTMENT AT [facility] Provider Note   CSN: [id number] Arrival date & time: 12/30/23  4098     Patient presents with: Emesis   Jacqueline [name] is a 43 y.o. female.   43 year old female with prior medical history as detailed below presents for evaluation.  Patient reports that she just returned from a vacation trip to [place].  She was there for about 8 days.  During her trip to [place] she did not get dialysis.  She returns today and complains of nausea, vomiting, weakness, pain all over, shortness of breath.  She feels confused.  She apparently tried to get dialysis in the outpatient center today and was referred to the ED for emergent dialysis.  Patient previously was on a Tuesday, Thursday, Saturday schedule.  She has been on dialysis for  several months per her report.  She still makes urine.  She denies chest pain.  PMHx notable for HTN, T2DM, ESRD (TTS HD schedule), OSA, morbid obesity, anemia, headaches, and anxiety.  The history is provided by the patient.       Prior to Admission medications   Medication Sig Start Date End Date Taking? Authorizing Provider  calcitRIOL  (ROCALTROL ) 0.5 MCG capsule Take 0.5 mcg by mouth daily. 10/03/23   [provider]  furosemide  (LASIX ) 40 MG tablet Take 40 mg by mouth 2 (two) times daily. 11/15/23   [provider]  metoprolol  tartrate (LOPRESSOR ) 25 MG tablet Take 1 tablet (25 mg total) by mouth 2 (two) times daily. 10/20/23 01/18/24  [place], Eric J, DO  Vitamin D , Ergocalciferol , (DRISDOL ) 1.25 MG (50000 UNIT) CAPS capsule Take 1 capsule (50,000 Units total) by mouth every Thursday. 07/07/22   Angiulli, Everlyn Hockey, PA-C    Allergies: Patient has no known allergies.    Review of Systems  All other systems reviewed and are negative.   Updated Vital Signs BP (!) 143/91   Pulse (!) 103   Temp 98.1 F (36.7 C) (Oral)   Resp (!) 27   Ht 5' 5 (1.651 m)   Wt 72.1 kg   LMP 12/30/2023  (Exact Date)   SpO2 100%   BMI 26.46 kg/m   Physical Exam Vitals and nursing note reviewed.  Constitutional:      General: She is not in acute distress.    Appearance: Normal appearance. She is well-developed.  HENT:     Head: Normocephalic and atraumatic.   Eyes:     Conjunctiva/sclera: Conjunctivae normal.     Pupils: Pupils are equal, round, and reactive to light.    Cardiovascular:     Rate and Rhythm: Normal rate and regular rhythm.     Heart sounds: Normal heart sounds.  Pulmonary:     Effort: Pulmonary effort is normal. No respiratory distress.     Breath sounds: Normal breath sounds.  Abdominal:     General: There is no distension.     Palpations: Abdomen is soft.     Tenderness: There is no abdominal tenderness.   Musculoskeletal:        General: No deformity. Normal range of motion.     Cervical back: Normal range of motion and neck supple.   Skin:    General: Skin is warm and dry.   Neurological:     General: No focal deficit present.     Mental Status: She is alert and oriented to person, place, and time.     (all labs ordered are listed, but only abnormal results are  displayed) Labs Reviewed  COMPREHENSIVE METABOLIC PANEL WITH GFR - Abnormal; Notable for the following components:      Result Value   CO2 <7 (*)    Glucose, Bld 101 (*)    BUN 143 (*)    Creatinine, Ser 15.40 (*)    Calcium  7.2 (*)    AST 14 (*)    GFR, Estimated 3 (*)    All other components within normal limits  CBC WITH DIFFERENTIAL/PLATELET - Abnormal; Notable for the following components:   RBC 3.11 (*)    Hemoglobin 9.9 (*)    HCT 28.4 (*)    RDW 15.9 (*)    All other components within normal limits  I-STAT CHEM 8, ED    EKG: EKG Interpretation Date/Time:  Saturday December 30 2023 06:15:38 EDT Ventricular Rate:  104 PR Interval:  152 QRS Duration:  102 QT Interval:  375 QTC Calculation: 494 R Axis:   95  Text Interpretation: Sinus tachycardia Borderline right axis  deviation Low voltage, extremity leads Nonspecific T abnormalities, lateral leads Borderline prolonged QT interval Confirmed by Angela Kell [phone]) on 12/30/2023 6:56:19 AM  Radiology: No results found.   Procedures   Medications Ordered in the ED  acetaminophen  (TYLENOL ) tablet 1,000 mg (1,000 mg Oral Given 12/30/23 0733)                                    Medical Decision Making Amount and/or Complexity of Data Reviewed Labs: ordered. Radiology: ordered.  Risk OTC drugs.    Medical Screen Complete  This patient presented to the ED with complaint of missed dialysis.  This complaint involves an extensive number of treatment options. The initial differential diagnosis includes, but is not limited to, metabolic abnormality  This presentation is: Acute, Chronic, Self-Limited, Previously Undiagnosed, Uncertain Prognosis, Complicated, Systemic Symptoms, and Threat to Life/Bodily Function  Patient with multiple comorbidities including ESRD on HD.  She missed dialysis secondary to going on a vacation to [place].  Her last dialysis was approximate 8 days ago.  Labs demonstrate significant BUN and creatinine elevation.  Potassium is normal.  Chest x-ray is without significant fluid overload.  Case discussed with Dr. Yvonnie Heritage, nephrology.  She agrees with plan for dialysis today.  Patient likely appropriate for discharge home after dialysis session complete.  Additional history obtained: External records from outside sources obtained and reviewed including prior ED visits and prior Inpatient records.    Problem List / ED Course:  Missed dialysis   Disposition:  After consideration of the diagnostic results and the patients response to treatment, I feel that the patent would benefit from emergent dialysis.       Final diagnoses:  ESRD (end stage renal disease) [facility])    ED Discharge Orders     None          Burnette Carte, MD 12/30/23 [phone]

## 2023-12-30 NOTE — ED Notes (Signed)
 After ambulating to the bathroom the pt states that she feels lightheaded, PA notified and at bedside.

## 2023-12-30 NOTE — Consult Note (Signed)
 [facility] Renal Consultation Note    Indication for Consultation:  Management of ESRD/hemodialysis, anemia, hypertension/volume, and secondary hyperparathyroidism.  HPI: Jacqueline [name] is a 43 y.o. female with PMH including ESRD on dialysis (started in March 2025), HTN, sleep apnea who presents with missed HD. She was on a trip to [place] and missed several dialysis sessions. Reports nausea, vomiting, weight loss, weakness, fatigue and headache. Denies SOB at rest but reports dyspnea with exertion. Denies CP, palpitations, blurry vision. Reports she thinks she had a fever for the past few days. Still makes a lot of urine and denies dysuria, hematuria, fever, flank pain and chills. Labs today shjow K+ 3.6 -> 3.3, CO2 <7, BUN 143, Cr 15.4, Ca 7.2, Alb 3.6, WBC 6.9, Hgb 9.9 -> 8.5, Plt 291. CXR unremarkable. VS with tachypnea and mild hypertension, but O2 sat stable on room air.   Past Medical History:  Diagnosis Date   Anemia    Anxiety    Chronic bronchitis (HCC)    Chronic kidney disease    Hypertension    Increased frequency of headaches    Morbid obesity (HCC)    Necrotizing fasciitis (HCC)    Sleep apnea    does not use CPAP - - gastric bypass   Type II diabetes mellitus (HCC)    no meds, diet controlled   Past Surgical History:  Procedure Laterality Date   A/V SHUNT INTERVENTION N/A 11/17/2023   Procedure: A/V SHUNT INTERVENTION;  Surgeon: Melodie Spry, MD;  Location: [facility] INVASIVE CV LAB;  Service: Cardiovascular;  Laterality: N/A;  80% venous anastomosis   AV FISTULA PLACEMENT Left 05/02/2023   Procedure: LEFT ARM ARTERIOVENOUS (AV) FISTULA  GRAFT;  Surgeon: Philipp Brawn, MD;  Location: [facility] OR;  Service: Vascular;  Laterality: Left;   BARIATRIC SURGERY     BREAST REDUCTION SURGERY  03/21/2017   CARDIAC CATHETERIZATION  01/06/2016   CARDIAC CATHETERIZATION N/A 01/06/2016   Procedure: Left Heart Cath and Coronary Angiography;  Surgeon: Wenona Hamilton, MD;  Location:  [facility] INVASIVE CV LAB;  Service: Cardiovascular;  Laterality: N/A;   CESAREAN SECTION  08/2014   I & D EXTREMITY Right 06/18/2022   Procedure: IRRIGATION AND DEBRIDEMENT RIGHT ABDOMEN AND THIGH;  Surgeon: Kinsinger, Alphonso Aschoff, MD;  Location: [facility] OR;  Service: General;  Laterality: Right;   I & D EXTREMITY Right 06/20/2022   Procedure: WOUND EXPLORATION OF RIGHT THIGH AND RIGHT GROIN WITH IRRIGATION AND DEBRIDEMENT;  Surgeon: Caralyn Chandler, MD;  Location: [facility] OR;  Service: General;  Laterality: Right;   IR AV DIALY SHUNT INTRO NEEDLE/INTRACATH INITIAL W/PTA/IMG LEFT  10/19/2023   IR DIALY SHUNT INTRO NEEDLE/INTRACATH INITIAL W/IMG LEFT Left 11/29/2023   REDUCTION MAMMAPLASTY Bilateral 03/21/2017   TRANSESOPHAGEAL ECHOCARDIOGRAM (CATH LAB) N/A 10/16/2023   Procedure: TRANSESOPHAGEAL ECHOCARDIOGRAM;  Surgeon: Harrold Lincoln, MD;  Location: [facility] INVASIVE CV LAB;  Service: Cardiovascular;  Laterality: N/A;   Family History  Problem Relation Age of Onset   Diabetes Mother    Hypertension Mother    Thyroid  disease Mother    Kidney disease Maternal Grandmother    Diabetes Maternal Grandmother    Heart attack Other    Social History:  reports that she has never smoked. She has never used smokeless tobacco. She reports that she does not drink alcohol and does not use drugs.  ROS: As per HPI otherwise negative.  Physical Exam: Vitals:   12/30/23 0613 12/30/23 0615 12/30/23 0630 12/30/23 0730  BP: Aaron Aas)  152/84  (!) 143/91 (!) 150/73  Pulse:   (!) 103 97  Resp:   (!) 27 (!) 23  Temp:      TempSrc:      SpO2:  100% 100% 100%  Weight:      Height:         General: Well developed, well nourished, in no acute distress. Head: Normocephalic, atraumatic, sclera non-icteric, mucus membranes are moist. Neck: JVD not elevated. Lungs: Clear bilaterally to auscultation without wheezes, rales, or rhonchi. Breathing is unlabored. Heart: RRR with normal S1, S2. No murmurs, rubs, or gallops  appreciated. Abdomen: Soft, non-tender, non-distended with normoactive bowel sounds.  Musculoskeletal:  Strength and tone appear normal for age. Lower extremities: trace edema bilateral lower extremities Neuro: Alert and oriented X 3. No tremor/asterixis noted Psych:  Responds to questions appropriately with a normal affect. Dialysis Access: AVG + t/b  No Known Allergies Prior to Admission medications   Medication Sig Start Date End Date Taking? Authorizing Provider  calcitRIOL  (ROCALTROL ) 0.5 MCG capsule Take 0.5 mcg by mouth daily. 10/03/23   [provider]  furosemide  (LASIX ) 40 MG tablet Take 40 mg by mouth 2 (two) times daily. 11/15/23   [provider]  metoprolol  tartrate (LOPRESSOR ) 25 MG tablet Take 1 tablet (25 mg total) by mouth 2 (two) times daily. 10/20/23 01/18/24  [place], Eric J, DO  Vitamin D , Ergocalciferol , (DRISDOL ) 1.25 MG (50000 UNIT) CAPS capsule Take 1 capsule (50,000 Units total) by mouth every Thursday. 07/07/22   Angiulli, Everlyn Hockey, PA-C   No current facility-administered medications for this encounter.   Current Outpatient Medications  Medication Sig Dispense Refill   calcitRIOL  (ROCALTROL ) 0.5 MCG capsule Take 0.5 mcg by mouth daily.     furosemide  (LASIX ) 40 MG tablet Take 40 mg by mouth 2 (two) times daily.     metoprolol  tartrate (LOPRESSOR ) 25 MG tablet Take 1 tablet (25 mg total) by mouth 2 (two) times daily. 180 tablet 0   Vitamin D , Ergocalciferol , (DRISDOL ) 1.25 MG (50000 UNIT) CAPS capsule Take 1 capsule (50,000 Units total) by mouth every Thursday. 5 capsule 0   Labs: Basic Metabolic Panel: Recent Labs  Lab 12/30/23 0630 12/30/23 0744  NA 137 141  K 3.6 3.3*  CL 108 116*  CO2 <7*  --   GLUCOSE 101* 89  BUN 143* >130*  CREATININE 15.40* 16.70*  CALCIUM  7.2*  --    Liver Function Tests: Recent Labs  Lab 12/30/23 0630  AST 14*  ALT 13  ALKPHOS 90  BILITOT 0.9  PROT 7.7  ALBUMIN  3.6   No results for input(s): LIPASE,  AMYLASE in the last 168 hours. No results for input(s): AMMONIA in the last 168 hours. CBC: Recent Labs  Lab 12/30/23 0630 12/30/23 0744  WBC 6.9  --   NEUTROABS PENDING  --   HGB 9.9* 8.5*  HCT 28.4* 25.0*  MCV 91.3  --   PLT 291  --    Studies/Results: DG Chest Port 1 View Result Date: 12/30/2023 CLINICAL DATA:  sob EXAM: PORTABLE CHEST 1 VIEW COMPARISON:  October 05, 2023 FINDINGS: The cardiomediastinal silhouette is unchanged and overall enlarged in contour. No pleural effusion. No pneumothorax. No acute pleuroparenchymal abnormality. Favored remote LEFT-sided rib fractures IMPRESSION: No acute cardiopulmonary abnormality. Electronically Signed   By: Clancy Crimes M.D.   On: 12/30/2023 08:08    Dialysis Orders:  Center: [facility] on TTS. 180NRe 4 hours BFR 500 DFR Auto 1.5 EDW 77.4kg 2K 3Ca  AVG 15g Heparin  3000 unit bolus Mircera 100mcg IV q 2 weeks Venofer 100mg  weekly Hectorol 6mcg IV q HD  Assessment/Plan:  ESRD:  Missed HD for >1 week, labs/symptoms consistent with uremia and acidosis. Will plan for HD today, lower treatment time and BFR to prevent disequilibrium then can reevaluate to determine if she needs serial HD. Education provided regarding missing HD.  Hypertension/volume: BP elevated, trace edema, CXR unremarkable. SOB likely related to her acidosis. UF with HD as tolerated and will need lower EDW at discharge.   Anemia: Hgb 8.5. Continue ESA, not due for next dose yet.   Metabolic bone disease: Calcium  chronically low, on added Ca bath. Continue VDRA, follow phos level.   Ramona Burner, PA-C 12/30/2023, 8:20 AM  [facility] Pager: [phone]

## 2023-12-30 NOTE — ED Notes (Signed)
Pt tx to Dialysis

## 2023-12-30 NOTE — ED Notes (Signed)
 Pt is back in the ED. Prior RN reported pt is Aox2. After a missed week of dialysis, 1L of fluid was took off. No meds given. Pt is not in acute distress, states that she feels better now. Denies any pain. BP 126/68 HR 106

## 2023-12-30 NOTE — ED Notes (Signed)
 Pt states her DC BP is not unusual after dialysis

## 2023-12-30 NOTE — ED Provider Notes (Signed)
 Patient returns from dialysis.  She is feeling very well.  Discharge.   Lowery Rue, DO 12/30/23 1630

## 2023-12-31 LAB — HEPATITIS B SURFACE ANTIBODY, QUANTITATIVE: Hep B S AB Quant (Post): <3.5 m[IU]/mL — ABNORMAL LOW

## 2024-01-01 LAB — PATHOLOGIST SMEAR REVIEW

## 2024-01-09 DIAGNOSIS — N186 End stage renal disease: Secondary | ICD-10-CM | POA: Diagnosis not present

## 2024-01-09 DIAGNOSIS — D631 Anemia in chronic kidney disease: Secondary | ICD-10-CM | POA: Diagnosis not present

## 2024-01-09 DIAGNOSIS — N2581 Secondary hyperparathyroidism of renal origin: Secondary | ICD-10-CM | POA: Diagnosis not present

## 2024-01-09 DIAGNOSIS — Z992 Dependence on renal dialysis: Secondary | ICD-10-CM | POA: Diagnosis not present

## 2024-01-11 DIAGNOSIS — D631 Anemia in chronic kidney disease: Secondary | ICD-10-CM | POA: Diagnosis not present

## 2024-01-11 DIAGNOSIS — Z992 Dependence on renal dialysis: Secondary | ICD-10-CM | POA: Diagnosis not present

## 2024-01-11 DIAGNOSIS — N2581 Secondary hyperparathyroidism of renal origin: Secondary | ICD-10-CM | POA: Diagnosis not present

## 2024-01-11 DIAGNOSIS — N186 End stage renal disease: Secondary | ICD-10-CM | POA: Diagnosis not present

## 2024-01-13 DIAGNOSIS — N186 End stage renal disease: Secondary | ICD-10-CM | POA: Diagnosis not present

## 2024-01-13 DIAGNOSIS — N2581 Secondary hyperparathyroidism of renal origin: Secondary | ICD-10-CM | POA: Diagnosis not present

## 2024-01-13 DIAGNOSIS — Z992 Dependence on renal dialysis: Secondary | ICD-10-CM | POA: Diagnosis not present

## 2024-01-13 DIAGNOSIS — D631 Anemia in chronic kidney disease: Secondary | ICD-10-CM | POA: Diagnosis not present

## 2024-01-15 DIAGNOSIS — N186 End stage renal disease: Secondary | ICD-10-CM | POA: Diagnosis not present

## 2024-01-15 DIAGNOSIS — Z992 Dependence on renal dialysis: Secondary | ICD-10-CM | POA: Diagnosis not present

## 2024-01-15 DIAGNOSIS — I120 Hypertensive chronic kidney disease with stage 5 chronic kidney disease or end stage renal disease: Secondary | ICD-10-CM | POA: Diagnosis not present

## 2024-01-16 DIAGNOSIS — D631 Anemia in chronic kidney disease: Secondary | ICD-10-CM | POA: Diagnosis not present

## 2024-01-16 DIAGNOSIS — N2581 Secondary hyperparathyroidism of renal origin: Secondary | ICD-10-CM | POA: Diagnosis not present

## 2024-01-16 DIAGNOSIS — N186 End stage renal disease: Secondary | ICD-10-CM | POA: Diagnosis not present

## 2024-01-16 DIAGNOSIS — Z992 Dependence on renal dialysis: Secondary | ICD-10-CM | POA: Diagnosis not present

## 2024-01-18 DIAGNOSIS — N2581 Secondary hyperparathyroidism of renal origin: Secondary | ICD-10-CM | POA: Diagnosis not present

## 2024-01-18 DIAGNOSIS — N186 End stage renal disease: Secondary | ICD-10-CM | POA: Diagnosis not present

## 2024-01-18 DIAGNOSIS — Z992 Dependence on renal dialysis: Secondary | ICD-10-CM | POA: Diagnosis not present

## 2024-01-18 DIAGNOSIS — D631 Anemia in chronic kidney disease: Secondary | ICD-10-CM | POA: Diagnosis not present

## 2024-01-23 DIAGNOSIS — N2581 Secondary hyperparathyroidism of renal origin: Secondary | ICD-10-CM | POA: Diagnosis not present

## 2024-01-23 DIAGNOSIS — Z992 Dependence on renal dialysis: Secondary | ICD-10-CM | POA: Diagnosis not present

## 2024-01-23 DIAGNOSIS — D631 Anemia in chronic kidney disease: Secondary | ICD-10-CM | POA: Diagnosis not present

## 2024-01-23 DIAGNOSIS — N186 End stage renal disease: Secondary | ICD-10-CM | POA: Diagnosis not present

## 2024-01-25 DIAGNOSIS — N2581 Secondary hyperparathyroidism of renal origin: Secondary | ICD-10-CM | POA: Diagnosis not present

## 2024-01-25 DIAGNOSIS — Z992 Dependence on renal dialysis: Secondary | ICD-10-CM | POA: Diagnosis not present

## 2024-01-25 DIAGNOSIS — N186 End stage renal disease: Secondary | ICD-10-CM | POA: Diagnosis not present

## 2024-01-25 DIAGNOSIS — D631 Anemia in chronic kidney disease: Secondary | ICD-10-CM | POA: Diagnosis not present

## 2024-01-27 DIAGNOSIS — N2581 Secondary hyperparathyroidism of renal origin: Secondary | ICD-10-CM | POA: Diagnosis not present

## 2024-01-27 DIAGNOSIS — Z992 Dependence on renal dialysis: Secondary | ICD-10-CM | POA: Diagnosis not present

## 2024-01-27 DIAGNOSIS — N186 End stage renal disease: Secondary | ICD-10-CM | POA: Diagnosis not present

## 2024-01-27 DIAGNOSIS — D631 Anemia in chronic kidney disease: Secondary | ICD-10-CM | POA: Diagnosis not present

## 2024-02-06 DIAGNOSIS — Z992 Dependence on renal dialysis: Secondary | ICD-10-CM | POA: Diagnosis not present

## 2024-02-06 DIAGNOSIS — N2581 Secondary hyperparathyroidism of renal origin: Secondary | ICD-10-CM | POA: Diagnosis not present

## 2024-02-06 DIAGNOSIS — N186 End stage renal disease: Secondary | ICD-10-CM | POA: Diagnosis not present

## 2024-02-08 ENCOUNTER — Inpatient Hospital Stay (HOSPITAL_BASED_OUTPATIENT_CLINIC_OR_DEPARTMENT_OTHER)
Admission: EM | Admit: 2024-02-08 | Discharge: 2024-02-15 | DRG: 252 | Disposition: A | Discharge status: Home / Self Care | Attending: Internal Medicine | Admitting: Internal Medicine

## 2024-02-08 ENCOUNTER — Observation Stay (HOSPITAL_COMMUNITY)

## 2024-02-08 ENCOUNTER — Other Ambulatory Visit: Payer: Self-pay

## 2024-02-08 ENCOUNTER — Emergency Department (HOSPITAL_BASED_OUTPATIENT_CLINIC_OR_DEPARTMENT_OTHER)

## 2024-02-08 ENCOUNTER — Encounter (HOSPITAL_BASED_OUTPATIENT_CLINIC_OR_DEPARTMENT_OTHER): Payer: Self-pay

## 2024-02-08 DIAGNOSIS — I5032 Chronic diastolic (congestive) heart failure: Secondary | ICD-10-CM

## 2024-02-08 DIAGNOSIS — E785 Hyperlipidemia, unspecified: Secondary | ICD-10-CM | POA: Diagnosis present

## 2024-02-08 DIAGNOSIS — G4733 Obstructive sleep apnea (adult) (pediatric): Secondary | ICD-10-CM | POA: Diagnosis present

## 2024-02-08 DIAGNOSIS — I129 Hypertensive chronic kidney disease with stage 1 through stage 4 chronic kidney disease, or unspecified chronic kidney disease: Secondary | ICD-10-CM | POA: Diagnosis not present

## 2024-02-08 DIAGNOSIS — J42 Unspecified chronic bronchitis: Secondary | ICD-10-CM | POA: Diagnosis present

## 2024-02-08 DIAGNOSIS — M129 Arthropathy, unspecified: Secondary | ICD-10-CM | POA: Diagnosis not present

## 2024-02-08 DIAGNOSIS — R079 Chest pain, unspecified: Principal | ICD-10-CM | POA: Diagnosis present

## 2024-02-08 DIAGNOSIS — N189 Chronic kidney disease, unspecified: Secondary | ICD-10-CM | POA: Diagnosis not present

## 2024-02-08 DIAGNOSIS — I120 Hypertensive chronic kidney disease with stage 5 chronic kidney disease or end stage renal disease: Secondary | ICD-10-CM

## 2024-02-08 DIAGNOSIS — R071 Chest pain on breathing: Secondary | ICD-10-CM

## 2024-02-08 DIAGNOSIS — E11610 Type 2 diabetes mellitus with diabetic neuropathic arthropathy: Secondary | ICD-10-CM | POA: Diagnosis present

## 2024-02-08 DIAGNOSIS — Z8249 Family history of ischemic heart disease and other diseases of the circulatory system: Secondary | ICD-10-CM

## 2024-02-08 DIAGNOSIS — E1122 Type 2 diabetes mellitus with diabetic chronic kidney disease: Secondary | ICD-10-CM | POA: Diagnosis present

## 2024-02-08 DIAGNOSIS — G8929 Other chronic pain: Secondary | ICD-10-CM | POA: Diagnosis present

## 2024-02-08 DIAGNOSIS — I10 Essential (primary) hypertension: Secondary | ICD-10-CM

## 2024-02-08 DIAGNOSIS — E1140 Type 2 diabetes mellitus with diabetic neuropathy, unspecified: Secondary | ICD-10-CM | POA: Diagnosis present

## 2024-02-08 DIAGNOSIS — T82511A Breakdown (mechanical) of surgically created arteriovenous shunt, initial encounter: Principal | ICD-10-CM | POA: Diagnosis present

## 2024-02-08 DIAGNOSIS — D631 Anemia in chronic kidney disease: Secondary | ICD-10-CM | POA: Diagnosis present

## 2024-02-08 DIAGNOSIS — Z833 Family history of diabetes mellitus: Secondary | ICD-10-CM

## 2024-02-08 DIAGNOSIS — R0789 Other chest pain: Secondary | ICD-10-CM | POA: Diagnosis not present

## 2024-02-08 DIAGNOSIS — N186 End stage renal disease: Secondary | ICD-10-CM | POA: Diagnosis not present

## 2024-02-08 DIAGNOSIS — Z79899 Other long term (current) drug therapy: Secondary | ICD-10-CM

## 2024-02-08 DIAGNOSIS — R7989 Other specified abnormal findings of blood chemistry: Secondary | ICD-10-CM

## 2024-02-08 DIAGNOSIS — L84 Corns and callosities: Secondary | ICD-10-CM | POA: Diagnosis present

## 2024-02-08 DIAGNOSIS — M79671 Pain in right foot: Secondary | ICD-10-CM | POA: Diagnosis not present

## 2024-02-08 DIAGNOSIS — Z8739 Personal history of other diseases of the musculoskeletal system and connective tissue: Secondary | ICD-10-CM

## 2024-02-08 DIAGNOSIS — G4730 Sleep apnea, unspecified: Secondary | ICD-10-CM

## 2024-02-08 DIAGNOSIS — Z992 Dependence on renal dialysis: Secondary | ICD-10-CM | POA: Diagnosis not present

## 2024-02-08 DIAGNOSIS — E875 Hyperkalemia: Secondary | ICD-10-CM | POA: Diagnosis present

## 2024-02-08 DIAGNOSIS — Z6827 Body mass index (BMI) 27.0-27.9, adult: Secondary | ICD-10-CM

## 2024-02-08 DIAGNOSIS — I2489 Other forms of acute ischemic heart disease: Secondary | ICD-10-CM

## 2024-02-08 DIAGNOSIS — I132 Hypertensive heart and chronic kidney disease with heart failure and with stage 5 chronic kidney disease, or end stage renal disease: Secondary | ICD-10-CM | POA: Diagnosis present

## 2024-02-08 DIAGNOSIS — R918 Other nonspecific abnormal finding of lung field: Secondary | ICD-10-CM | POA: Diagnosis not present

## 2024-02-08 DIAGNOSIS — Y712 Prosthetic and other implants, materials and accessory cardiovascular devices associated with adverse incidents: Secondary | ICD-10-CM | POA: Diagnosis present

## 2024-02-08 DIAGNOSIS — R778 Other specified abnormalities of plasma proteins: Secondary | ICD-10-CM | POA: Diagnosis not present

## 2024-02-08 DIAGNOSIS — F419 Anxiety disorder, unspecified: Secondary | ICD-10-CM | POA: Diagnosis present

## 2024-02-08 DIAGNOSIS — I959 Hypotension, unspecified: Secondary | ICD-10-CM | POA: Diagnosis not present

## 2024-02-08 DIAGNOSIS — R072 Precordial pain: Secondary | ICD-10-CM | POA: Diagnosis not present

## 2024-02-08 DIAGNOSIS — Z8349 Family history of other endocrine, nutritional and metabolic diseases: Secondary | ICD-10-CM

## 2024-02-08 LAB — BASIC METABOLIC PANEL WITH GFR
Anion gap: 19 — ABNORMAL HIGH (ref 5–15)
BUN: 56 mg/dL — ABNORMAL HIGH (ref 6–20)
CO2: 23 mmol/L (ref 22–32)
Calcium: 7.5 mg/dL — ABNORMAL LOW (ref 8.9–10.3)
Chloride: 99 mmol/L (ref 98–111)
Creatinine, Ser: 8.14 mg/dL — ABNORMAL HIGH (ref 0.44–1.00)
GFR, Estimated: 6 mL/min — ABNORMAL LOW (ref >60)
Glucose, Bld: 67 mg/dL — ABNORMAL LOW (ref 70–99)
Potassium: 4 mmol/L (ref 3.5–5.1)
Sodium: 141 mmol/L (ref 135–145)

## 2024-02-08 LAB — CBC
HCT: 31.1 % — ABNORMAL LOW (ref 36.0–46.0)
Hemoglobin: 10 g/dL — ABNORMAL LOW (ref 12.0–15.0)
MCH: 31.5 pg (ref 26.0–34.0)
MCHC: 32.2 g/dL (ref 30.0–36.0)
MCV: 98.1 fL (ref 80.0–100.0)
Platelets: 236 K/uL (ref 150–400)
RBC: 3.17 MIL/uL — ABNORMAL LOW (ref 3.87–5.11)
RDW: 15 % (ref 11.5–15.5)
WBC: 6.3 K/uL (ref 4.0–10.5)
nRBC: 0 % (ref 0.0–0.2)

## 2024-02-08 LAB — HEPATIC FUNCTION PANEL
ALT: 26 U/L (ref 0–44)
AST: 29 U/L (ref 15–41)
Albumin: 2.8 g/dL — ABNORMAL LOW (ref 3.5–5.0)
Alkaline Phosphatase: 60 U/L (ref 38–126)
Bilirubin, Direct: 0.2 mg/dL (ref 0.0–0.2)
Indirect Bilirubin: 0.6 mg/dL (ref 0.3–0.9)
Total Bilirubin: 0.8 mg/dL (ref 0.0–1.2)
Total Protein: 5.7 g/dL — ABNORMAL LOW (ref 6.5–8.1)

## 2024-02-08 LAB — TROPONIN T, HIGH SENSITIVITY
Troponin T High Sensitivity: 59 ng/L — ABNORMAL HIGH (ref <19)
Troponin T High Sensitivity: 43 ng/L — ABNORMAL HIGH (ref <19)

## 2024-02-08 MED ORDER — ACETAMINOPHEN 325 MG PO TABS
650.0000 mg | ORAL_TABLET | ORAL | Status: DC | PRN
Start: 2024-02-08 — End: 2024-02-15
  Filled 2024-02-08 (×2): qty 2

## 2024-02-08 MED ORDER — MORPHINE SULFATE (PF) 4 MG/ML IV SOLN
4.0000 mg | Freq: Once | INTRAVENOUS | Status: AC
  Administered 2024-02-08: 4 mg via INTRAVENOUS
  Filled 2024-02-08: qty 1

## 2024-02-08 MED ORDER — NITROGLYCERIN 0.4 MG SL SUBL
0.4000 mg | SUBLINGUAL_TABLET | SUBLINGUAL | Status: DC | PRN
  Administered 2024-02-09 (×2): 0.4 mg via SUBLINGUAL
  Filled 2024-02-08: qty 1

## 2024-02-08 MED ORDER — HEPARIN SODIUM (PORCINE) 5000 UNIT/ML IJ SOLN
5000.0000 [IU] | Freq: Two times a day (BID) | INTRAMUSCULAR | Status: DC
  Administered 2024-02-08 – 2024-02-14 (×11): 5000 [IU] via SUBCUTANEOUS
  Filled 2024-02-08 (×14): qty 1

## 2024-02-08 MED ORDER — ONDANSETRON HCL 4 MG/2ML IJ SOLN
4.0000 mg | Freq: Once | INTRAMUSCULAR | Status: AC
  Administered 2024-02-08: 4 mg via INTRAVENOUS
  Filled 2024-02-08: qty 2

## 2024-02-08 MED ORDER — ONDANSETRON HCL 4 MG/2ML IJ SOLN: 4.0000 mg | Freq: Four times a day (QID) | INTRAMUSCULAR | Status: DC | PRN

## 2024-02-08 MED ORDER — METOPROLOL TARTRATE 25 MG PO TABS
25.0000 mg | ORAL_TABLET | Freq: Two times a day (BID) | ORAL | Status: DC
  Administered 2024-02-08: 25 mg via ORAL
  Filled 2024-02-08 (×2): qty 1

## 2024-02-08 MED ORDER — CALCITRIOL 0.5 MCG PO CAPS
0.5000 ug | ORAL_CAPSULE | Freq: Every day | ORAL | Status: DC
  Administered 2024-02-08 – 2024-02-15 (×8): 0.5 ug via ORAL
  Filled 2024-02-08 (×8): qty 1

## 2024-02-08 MED ORDER — MORPHINE SULFATE (PF) 2 MG/ML IV SOLN
1.0000 mg | Freq: Four times a day (QID) | INTRAVENOUS | Status: AC | PRN
  Administered 2024-02-08 – 2024-02-09 (×2): 1 mg via INTRAVENOUS
  Filled 2024-02-08 (×2): qty 1

## 2024-02-08 NOTE — Care Plan (Signed)
 42 y/o F with pmh of HTN, DM, ESRD on HD who presented with left-sided CP.  Patient had gone to dialysis but was unable to be dialyzed.  EKG reveals sinus tachycardia without clear ischemic changes.  High-sensitivity troponin T 59.  Chest x-ray noting mild airway thickening better seen in the right infrahilar region concerning for bronchitis or reactive airway disease and old left rib deformities.  Noted to have callus of feet, but no wound or concern for infection reported.  Initial plans had been to obtain CT angiogram but unable to obtain appropriate IV access.  Question possible VQ scan.  Patient had been given morphine  and Zofran .  Accepted as observation to a telemetry bed.

## 2024-02-08 NOTE — H&P (Addendum)
 History and Physical    [name] FMW:[id number] DOB: 09-Feb-1981 DOA: 02/08/2024 DOS: the patient was seen and examined on 02/08/2024 . PCP: [name], MD  Patient coming from: [facility] Chief complaint: Chief Complaint  Patient presents with   Chest Pain   HPI:  Jacqueline [name] is a 43 y.o. female with past medical history  of  hypertension bronchitis diabetes chronic kidney disease, necrotizing fasciitis.  Patient presents ED with complaints of left-sided chest pain sharp intermittent worse with breathing and taking deep inspiration no orthopnea no leg edema.  Patient went to dialysis where she was also experiencing recurrent and persistent progressive chest pain and was sent to the emergency room.  They did attempt to dialyze her today using her AV graft on her left arm as patient is a Tuesday Thursday Saturday schedule and after multiple attempts there were unable to.  Patient has had graft stenosis and clots in the past.  Patient also reports that she has had pain in the sole of her right foot that has been progressively getting worse.  She has no sensation in her right leg as she had a history of necrotizing fasciitis and now has developed a large callus-like area that is severe and deep.  Patient states that bearing weight on it is made it very difficult as his walking.  No complaints of shortness of breath or cough.   ED Course:  Vital signs in the ED were notable for the following:  Vitals:   02/08/24 0918 02/08/24 0930 02/08/24 1325 02/08/24 1627  BP: 125/81 (!) 152/85 128/81 (!) 155/70  Pulse: 95 94 93 93  Temp: 98.2 F (36.8 C)  98.3 F (36.8 C) 98.3 F (36.8 C)  Resp: 17 20 15 19   Height:      Weight:      SpO2: 100% 100% 100% 97%  TempSrc: Oral  Oral Oral  BMI (Calculated):      >>ED evaluation thus far shows: EKG shows sinus tach at 104 PR 143 QTc 482 low voltage EKG.  Troponin of 59, 43. Basic metabolic panel shows glucose 67 BUN 56 creatinine 8.14  calcium  7.5 anion gap of 19 potassium of 4.0. CBC shows white count of 6.3 hemoglobin of 10 and platelets of 236. Chest x-ray today shows mild airway thickening right infrahilar region could reflect bronchitis.  >>While in the ED patient received the following: Medications  acetaminophen  (TYLENOL ) tablet 650 mg (has no administration in time range)  ondansetron  (ZOFRAN ) injection 4 mg (has no administration in time range)  heparin  injection 5,000 Units (has no administration in time range)  morphine  (PF) 2 MG/ML injection 1 mg (1 mg Intravenous Given by Other 02/08/24 1717)  metoprolol  tartrate (LOPRESSOR ) tablet 25 mg (has no administration in time range)  calcitRIOL  (ROCALTROL ) capsule 0.5 mcg (has no administration in time range)  morphine  (PF) 4 MG/ML injection 4 mg (4 mg Intravenous Given 02/08/24 0915)  ondansetron  (ZOFRAN ) injection 4 mg (4 mg Intravenous Given 02/08/24 0915)   Review of Systems  Cardiovascular:  Positive for chest pain.   Past Medical History:  Diagnosis Date   Anemia    Anxiety    Chronic bronchitis (HCC)    Chronic kidney disease    Diabetes (HCC) 01/01/2014   Hypertension    Increased frequency of headaches    Morbid obesity (HCC)    Necrotizing fasciitis (HCC)    Sleep apnea    does not use CPAP - - gastric bypass   Type  II diabetes mellitus (HCC)    no meds, diet controlled   Past Surgical History:  Procedure Laterality Date   A/V SHUNT INTERVENTION N/A 11/17/2023   Procedure: A/V SHUNT INTERVENTION;  Surgeon: Tobie Gordy POUR, MD;  Location: [facility] INVASIVE CV LAB;  Service: Cardiovascular;  Laterality: N/A;  80% venous anastomosis   AV FISTULA PLACEMENT Left 05/02/2023   Procedure: LEFT ARM ARTERIOVENOUS (AV) FISTULA  GRAFT;  Surgeon: Pearline Norman RAMAN, MD;  Location: [facility] OR;  Service: Vascular;  Laterality: Left;   BARIATRIC SURGERY     BREAST REDUCTION SURGERY  03/21/2017   CARDIAC CATHETERIZATION  01/06/2016   CARDIAC CATHETERIZATION N/A 01/06/2016    Procedure: Left Heart Cath and Coronary Angiography;  Surgeon: Deatrice DELENA Cage, MD;  Location: [facility] INVASIVE CV LAB;  Service: Cardiovascular;  Laterality: N/A;   CESAREAN SECTION  08/2014   I & D EXTREMITY Right 06/18/2022   Procedure: IRRIGATION AND DEBRIDEMENT RIGHT ABDOMEN AND THIGH;  Surgeon: Kinsinger, Herlene Righter, MD;  Location: [facility] OR;  Service: General;  Laterality: Right;   I & D EXTREMITY Right 06/20/2022   Procedure: WOUND EXPLORATION OF RIGHT THIGH AND RIGHT GROIN WITH IRRIGATION AND DEBRIDEMENT;  Surgeon: Curvin Deward MOULD, MD;  Location: [facility] OR;  Service: General;  Laterality: Right;   IR AV DIALY SHUNT INTRO NEEDLE/INTRACATH INITIAL W/PTA/IMG LEFT  10/19/2023   IR DIALY SHUNT INTRO NEEDLE/INTRACATH INITIAL W/IMG LEFT Left 11/29/2023   REDUCTION MAMMAPLASTY Bilateral 03/21/2017   TRANSESOPHAGEAL ECHOCARDIOGRAM (CATH LAB) N/A 10/16/2023   Procedure: TRANSESOPHAGEAL ECHOCARDIOGRAM;  Surgeon: Barbaraann Darryle Ned, MD;  Location: [facility] INVASIVE CV LAB;  Service: Cardiovascular;  Laterality: N/A;    reports that she has never smoked. She has never used smokeless tobacco. She reports that she does not drink alcohol and does not use drugs. No Known Allergies Family History  Problem Relation Age of Onset   Diabetes Mother    Hypertension Mother    Thyroid  disease Mother    Kidney disease Maternal Grandmother    Diabetes Maternal Grandmother    Heart attack Other    Prior to Admission medications   Medication Sig Start Date End Date Taking? Authorizing Provider  calcitRIOL  (ROCALTROL ) 0.5 MCG capsule Take 0.5 mcg by mouth daily. 10/03/23   [provider]  furosemide  (LASIX ) 40 MG tablet Take 40 mg by mouth 2 (two) times daily. 11/15/23   [provider]  metoprolol  tartrate (LOPRESSOR ) 25 MG tablet Take 1 tablet (25 mg total) by mouth 2 (two) times daily. 10/20/23 01/18/24  [place], Eric J, DO  Vitamin D , Ergocalciferol , (DRISDOL ) 1.25 MG (50000 UNIT) CAPS capsule Take 1 capsule  (50,000 Units total) by mouth every Thursday. 07/07/22   Pegge Toribio PARAS, PA-C                                                                                 Vitals:   02/08/24 0918 02/08/24 0930 02/08/24 1325 02/08/24 1627  BP: 125/81 (!) 152/85 128/81 (!) 155/70  Pulse: 95 94 93 93  Resp: 17 20 15 19   Temp: 98.2 F (36.8 C)  98.3 F (36.8 C) 98.3 F (36.8 C)  TempSrc: Oral  Oral Oral  SpO2: 100% 100% 100% 97%  Weight:      Height:       Physical Exam Vitals reviewed.  Constitutional:      General: She is not in acute distress.    Appearance: She is not ill-appearing.  HENT:     Head: Normocephalic and atraumatic.  Eyes:     Extraocular Movements: Extraocular movements intact.  Cardiovascular:     Rate and Rhythm: Normal rate and regular rhythm.     Heart sounds: Murmur heard.  Pulmonary:     Breath sounds: Normal breath sounds.  Abdominal:     General: There is no distension.     Palpations: Abdomen is soft.     Tenderness: There is no abdominal tenderness.  Neurological:     General: No focal deficit present.     Mental Status: She is alert and oriented to person, place, and time.     Labs on Admission: I have personally reviewed following labs and imaging studies CBC: Recent Labs  Lab 02/08/24 0802  WBC 6.3  HGB 10.0*  HCT 31.1*  MCV 98.1  PLT 236   Basic Metabolic Panel: Recent Labs  Lab 02/08/24 0802  NA 141  K 4.0  CL 99  CO2 23  GLUCOSE 67*  BUN 56*  CREATININE 8.14*  CALCIUM  7.5*   GFR: Estimated Creatinine Clearance: 9 mL/min (A) (by C-G formula based on SCr of 8.14 mg/dL (H)). Liver Function Tests: No results for input(s): AST, ALT, ALKPHOS, BILITOT, PROT, ALBUMIN  in the last 168 hours. No results for input(s): LIPASE, AMYLASE in the last 168 hours. No results for input(s): AMMONIA in the last 168 hours. Coagulation Profile: No results for input(s): INR, PROTIME in the last 168 hours. Cardiac Enzymes: No  results for input(s): CKTOTAL, CKMB, CKMBINDEX, TROPONINI in the last 168 hours. BNP (last 3 results) No results for input(s): PROBNP in the last 8760 hours. HbA1C: No results for input(s): HGBA1C in the last 72 hours. CBG: No results for input(s): GLUCAP in the last 168 hours. Lipid Profile: No results for input(s): CHOL, HDL, LDLCALC, TRIG, CHOLHDL, LDLDIRECT in the last 72 hours. Thyroid  Function Tests: No results for input(s): TSH, T4TOTAL, FREET4, T3FREE, THYROIDAB in the last 72 hours. Anemia Panel: No results for input(s): VITAMINB12, FOLATE, FERRITIN, TIBC, IRON, RETICCTPCT in the last 72 hours. Urine analysis:    Component Value Date/Time   COLORURINE YELLOW 10/13/2023 1728   APPEARANCEUR HAZY (A) 10/13/2023 1728   LABSPEC 1.014 10/13/2023 1728   PHURINE 7.0 10/13/2023 1728   GLUCOSEU 150 (A) 10/13/2023 1728   HGBUR SMALL (A) 10/13/2023 1728   BILIRUBINUR NEGATIVE 10/13/2023 1728   KETONESUR NEGATIVE 10/13/2023 1728   PROTEINUR >=300 (A) 10/13/2023 1728   UROBILINOGEN 0.2 08/21/2012 2130   NITRITE NEGATIVE 10/13/2023 1728   LEUKOCYTESUR MODERATE (A) 10/13/2023 1728   Radiological Exams on Admission: DG Chest 2 View Result Date: 02/08/2024 CLINICAL DATA:  Left chest pain, causing failure to complete dialysis today. Shortness of breath. EXAM: CHEST - 2 VIEW COMPARISON:  12/30/2023 FINDINGS: Old left rib deformities. Cardiac and mediastinal margins appear normal. Thoracic spondylosis. Potential mild airway thickening better seen in the right infrahilar region. No airspace opacity. No blunting of the costophrenic angles. IMPRESSION: 1. Potential mild airway thickening, better seen in the right infrahilar region. This could reflect bronchitis or reactive airways disease. 2. Old left rib deformities. 3. Thoracic spondylosis. Electronically Signed   By: Ryan Salvage M.D.   On: 02/08/2024 09:44  Data Reviewed: Relevant notes  from primary care and specialist visits, past discharge summaries as available in EHR, including Care Everywhere . Prior diagnostic testing as pertinent to current admission diagnoses, Updated medications and problem lists for reconciliation .ED course, including vitals, labs, imaging, treatment and response to treatment,Triage notes, nursing and pharmacy notes and ED provider's notes.Notable results as noted in HPI.Discussed case with EDMD/ ED APP/ or Specialty MD on call and as needed.  Assessment & Plan  >> End-stage renal disease on hemodialysis: History of AV graft malfunction with IR fistulogram and balloon angioplasty in April 2025.  IR has been consulted for suspected AV graft malfunction as they were not able to access today.  Clinically patient is stable with no rales or volume overload.  Nephrology consult per a.m. team once patient's access is functional. Continue calcitriol , sodium bicarb.  >> AV graft  malfunction: IR consult order placed.   >> Chest pain: Troponins are mildly elevated secondary to patient's end-stage renal disease.  EKG is nonischemic.  Cardiology consulted patient is moderate to high risk factor for coronary heart disease.  Will keep patient n.p.o. will also obtain a VQ scan for pulmonary embolism evaluation.  >> Essential hypertension: Home regimen consists of amlodipine  and hydralazine  and we will continue the same   >> Diabetes mellitus type 2: Per patient diabetes has resolved, will continue with every 6 hour Accu-Cheks and coverage.  >> Right foot pain: Large callus on the bottom of her foot suspect patient will need intervention will defer to day team for podiatry consult.  X-ray of the foot pending.   >> Chronic diastolic congestive heart failure: Patient is euvolemic volume management through hemodialysis.   >> Anemia of chronic kidney disease:    Latest Ref Rng & Units 02/08/2024    8:02 AM 12/30/2023    7:44 AM 12/30/2023    6:30 AM  CBC   WBC 4.0 - 10.5 K/uL 6.3   6.9   Hemoglobin 12.0 - 15.0 g/dL 89.9  8.5  9.9   Hematocrit 36.0 - 46.0 % 31.1  25.0  28.4   Platelets 150 - 400 K/uL 236   291   Monitor type and screen as needed.   DVT prophylaxis:  Heparin  Consults:  IR Cardiology  Advance Care Planning:    Code Status: Full Code   Family Communication:  None Disposition Plan:  Home Severity of Illness: The appropriate patient status for this patient is OBSERVATION. Observation status is judged to be reasonable and necessary in order to provide the required intensity of service to ensure the patient's safety. The patient's presenting symptoms, physical exam findings, and initial radiographic and laboratory data in the context of their medical condition is felt to place them at decreased risk for further clinical deterioration. Furthermore, it is anticipated that the patient will be medically stable for discharge from the hospital within 2 midnights of admission.   Unresulted Labs (From admission, onward)     Start     Ordered   02/09/24 0500  Lipid panel  Tomorrow morning,   R       Question:  Specimen collection method  Answer:  Lab=Lab collect   02/08/24 1857   02/09/24 0500  Hemoglobin A1c  Tomorrow morning,   R       Question:  Specimen collection method  Answer:  Lab=Lab collect   02/08/24 1857   02/08/24 1859  Hepatic function panel  Add-on,   AD       Question:  Specimen collection method  Answer:  Lab=Lab collect   02/08/24 1858            Meds ordered this encounter  Medications   morphine  (PF) 4 MG/ML injection 4 mg   ondansetron  (ZOFRAN ) injection 4 mg   acetaminophen  (TYLENOL ) tablet 650 mg   ondansetron  (ZOFRAN ) injection 4 mg   heparin  injection 5,000 Units   morphine  (PF) 2 MG/ML injection 1 mg   metoprolol  tartrate (LOPRESSOR ) tablet 25 mg   calcitRIOL  (ROCALTROL ) capsule 0.5 mcg     Orders Placed This Encounter  Procedures   DG Chest 2 View   NM Pulmonary Perfusion   IR  Radiologist Eval & Mgmt   DG Foot Complete Right   Basic metabolic panel   CBC   Lipid panel   Hemoglobin A1c   Hepatic function panel   Diet clear liquid Room service appropriate? Yes; Fluid consistency: Thin   Document Height and Actual Weight   Saline Lock IV, Maintain IV access (when placed in a treatment room)   Cardiac Monitoring Continuous x 48 hours Indications for use: Other; Other indications for use: chest pain   Patient has an active order for admit to inpatient/place in observation   Refer to Sidebar Report Mobility Protocol for Adult Inpatient   Apply Angina, Rule Out Myocardial Infarction Care Plan   Vital signs with O2 sat q4 hours x 24 hours, then q shift   RN may order Cardiology PRN Orders utilizing Cardiology PRN medications (through manage orders) for the following patient needs:   Bed rest   Full code   Consult to hospitalist   ED Pulse oximetry, continuous   ED EKG   EKG 12-Lead   EKG 12-Lead   EKG   EKG   EKG 12-Lead (at 6am)   EKG 12-Lead (recurrent chest pain)   ECHOCARDIOGRAM COMPLETE   Place in observation (patient's expected length of stay will be less than 2 midnights)   Place in observation (patient's expected length of stay will be less than 2 midnights)    Author: Mario LULLA Blanch, MD 12 pm- 8 pm. [facility]. 02/08/2024 7:14 PM Please note for any communication after hours contact [facility] Assigned provider on call on Amion.

## 2024-02-08 NOTE — ED Provider Notes (Signed)
 [facility] Provider Note   CSN: [id number] Arrival date & time: 02/08/24  9248     Patient presents with: Chest Pain   Jacqueline [name] is a 43 y.o. female.    Chest Pain    Patient has a history of hypertension bronchitis diabetes chronic kidney disease, necrotizing fasciitis.  Patient presents ED with complaints of chest pain that started last evening.  Patient states she has been having sharp pain underneath the left breast.  Nothing seems to particular make it get better.  Possibly worse with breathing. It has been gradually increasing in severity since yesterday.  Patient went to dialysis today.  While she was there she was experiencing chest pain.  They were not able to complete her dialysis because of fistula access.  Patient started having increasing pain so she came to the ED.  She denies any measured fevers although she has felt hot.  Patient states she also has a sore area on the bottom of her foot she has not noticed any increasing redness or swelling in the leg  Prior to Admission medications   Medication Sig Start Date End Date Taking? Authorizing Provider  calcitRIOL  (ROCALTROL ) 0.5 MCG capsule Take 0.5 mcg by mouth daily. 10/03/23   [provider]  furosemide  (LASIX ) 40 MG tablet Take 40 mg by mouth 2 (two) times daily. 11/15/23   [provider]  metoprolol  tartrate (LOPRESSOR ) 25 MG tablet Take 1 tablet (25 mg total) by mouth 2 (two) times daily. 10/20/23 01/18/24  [place], Eric J, DO  Vitamin D , Ergocalciferol , (DRISDOL ) 1.25 MG (50000 UNIT) CAPS capsule Take 1 capsule (50,000 Units total) by mouth every Thursday. 07/07/22   Angiulli, Toribio PARAS, PA-C    Allergies: Patient has no known allergies.    Review of Systems  Cardiovascular:  Positive for chest pain.    Updated Vital Signs BP 125/81   Pulse 95   Temp 98.2 F (36.8 C) (Oral)   Resp 17   Ht 1.651 m (5' 5)   Wt 76.2 kg   LMP 01/28/2024  (Approximate)   SpO2 100%   BMI 27.96 kg/m   Physical Exam Vitals and nursing note reviewed.  Constitutional:      Appearance: She is well-developed. She is not diaphoretic.  HENT:     Head: Normocephalic and atraumatic.     Right Ear: External ear normal.     Left Ear: External ear normal.  Eyes:     General: No scleral icterus.       Right eye: No discharge.        Left eye: No discharge.     Conjunctiva/sclera: Conjunctivae normal.  Neck:     Trachea: No tracheal deviation.  Cardiovascular:     Rate and Rhythm: Normal rate and regular rhythm.  Pulmonary:     Effort: Pulmonary effort is normal. No respiratory distress.     Breath sounds: Normal breath sounds. No stridor. No wheezing or rales.  Abdominal:     General: Bowel sounds are normal. There is no distension.     Palpations: Abdomen is soft.     Tenderness: There is no abdominal tenderness. There is no guarding or rebound.     Comments: Patient has firm calluses on the plantar aspect of her midfoot laterally on both feet, the right foot is larger in size, there is no underlying erythema there is no fluctuance and there is no lymphangitic streaking  Musculoskeletal:  General: No tenderness or deformity.     Cervical back: Neck supple.  Skin:    General: Skin is warm and dry.     Findings: No rash.  Neurological:     General: No focal deficit present.     Mental Status: She is alert.     Cranial Nerves: No cranial nerve deficit, dysarthria or facial asymmetry.     Sensory: No sensory deficit.     Motor: No abnormal muscle tone or seizure activity.     Coordination: Coordination normal.  Psychiatric:        Mood and Affect: Mood normal.     (all labs ordered are listed, but only abnormal results are displayed) Labs Reviewed  BASIC METABOLIC PANEL WITH GFR - Abnormal; Notable for the following components:      Result Value   Glucose, Bld 67 (*)    BUN 56 (*)    Creatinine, Ser 8.14 (*)    Calcium  7.5  (*)    GFR, Estimated 6 (*)    Anion gap 19 (*)    All other components within normal limits  CBC - Abnormal; Notable for the following components:   RBC 3.17 (*)    Hemoglobin 10.0 (*)    HCT 31.1 (*)    All other components within normal limits  TROPONIN T, HIGH SENSITIVITY - Abnormal; Notable for the following components:   Troponin T High Sensitivity 59 (*)    All other components within normal limits  HCG, SERUM, QUALITATIVE  TROPONIN T, HIGH SENSITIVITY    EKG: EKG Interpretation Date/Time:  Thursday February 08 2024 08:00:52 EDT Ventricular Rate:  104 PR Interval:  143 QRS Duration:  91 QT Interval:  366 QTC Calculation: 482 R Axis:   105  Text Interpretation: Sinus tachycardia Right axis deviation Low voltage, extremity leads No significant change since last tracing Confirmed by Randol Simmonds [phone]) on 02/08/2024 8:02:12 AM  Radiology: No results found.   Procedures   Medications Ordered in the ED  morphine  (PF) 4 MG/ML injection 4 mg (4 mg Intravenous Given 02/08/24 0915)  ondansetron  (ZOFRAN ) injection 4 mg (4 mg Intravenous Given 02/08/24 0915)    Clinical Course as of 02/08/24 1000  Thu Feb 08, 2024  0858 CBC(!) No leukocytosis, hemoglobin stable [JK]  0912 Troponin T, High Sensitivity(!) Troponin slightly elevated [JK]  0912 Basic metabolic panel(!) Metabolic panel consistent with her chronic kidney disease [JK]  [phone] Patient has IVC in place but inadequate for CT angio.  Will consult for admission for further evaluation, possible VQ scan [JK]  [phone] Case discussed with Dr Claudene [JK]    Clinical Course User Index [JK] Randol Simmonds, MD                                 Medical Decision Making Problems Addressed: Chest pain, unspecified type: acute illness or injury that poses a threat to life or bodily functions Elevated troponin: acute illness or injury Stage 5 chronic kidney disease on chronic dialysis [facility]): chronic illness or injury  Amount and/or  Complexity of Data Reviewed Labs: ordered. Decision-making details documented in ED Course. Radiology: ordered and independent interpretation performed.  Risk Prescription drug management.   Patient presented to the ED for evaluation of chest pain.  Patient describes left-sided chest pain.  It increases with breathing.  Consider the possibility of ACS, pneumonia, pneumothorax, pulmonary embolism.  Chest x-ray does not show pneumonia  or pneumothorax on my read.  Patient does have elevated troponin.  She is also tachycardic.  I am concerned about the possibility of pulmonary embolism.  Patient is a difficult IV stick with her chronic kidney disease.  IV was obtained but is not adequate for a CT angiogram.  With her elevated troponin concerns for PE I think she would benefit from further evaluation, possible VQ scan.  I will consult with the hospitalist to arrange for admission.  Patient did not receive dialysis today.  No overt pulmonary edema or electrolyte abnormality.  No emergent need for dialysis but she did not have her session today     Final diagnoses:  Chest pain, unspecified type  Elevated troponin  Stage 5 chronic kidney disease on chronic dialysis [facility])    ED Discharge Orders     None          Randol Simmonds, MD 02/08/24 1000

## 2024-02-08 NOTE — Progress Notes (Signed)
 Pt admitted to rm 37 from [facility]. Initiated tele. Vss. Call bell within reach.   Amado GORMAN Arabia, RN

## 2024-02-08 NOTE — ED Triage Notes (Addendum)
 Pt reports that she is having chest pain under her left breast. States that she was at Dialysis this morning and that she was unable to complete dialysis and that the pain was getting constantly worse. States that she feels short of breath. Respiratory at bedside in triage. No abnormality per respiratory. States that she has been having some fevers and that she has a wound to her right foot.

## 2024-02-08 NOTE — ED Notes (Addendum)
 Called to room by pt. Pt complaining of pain under left breast. Hurts worse with breathing. Pain described as tight and sharp. EKG obtained and given to provider. No new orders at this time

## 2024-02-08 NOTE — Progress Notes (Signed)
 Performed EKG and placed it in pt's chart.   Lawson Radar, RN

## 2024-02-08 NOTE — Plan of Care (Signed)

## 2024-02-08 NOTE — Progress Notes (Deleted)
 Unable performed EKG due to EKG machine broken. Paged stat EKG via [phone] and EKG department via [phone]. Waiting for both numbers to call back.   Amado GORMAN Arabia, RN

## 2024-02-08 NOTE — Consult Note (Signed)
 Cardiology Consultation   Patient ID: Davis Vannatter MRN: [id number]; DOB: 1981-03-25  Admit date: 02/08/2024 Date of Consult: 02/08/2024  PCP:  [name], MD   [facility] Providers Cardiologist:  Lynwood Schilling, MD        Patient Profile: Jacqueline [name] is a 43 y.o. female with a hx of hypertension, diabetes, chronic diastolic heart failure, ESRD, morbid obesity, sleep apnea, history of preeclampsia who is being seen 02/08/2024 for the evaluation of chest discomfort at the request of Dr. Maximino Sharps.  History of Present Illness: Ms. Steinhilber she tells me that she has been experiencing intermittent chest discomfort.  She reports recently going to [place] with her family for vacation and coming back not long ago.  Presents the previous weekend she has had intermittent chest discomfort.  She tells me initially started in the left breast and then intensified intensified after dialysis on Tuesday.  She describes the pain as a the pain is tight and sharp, worsens with movement, and is accompanied by shortness of breath, especially when speaking. It does not fully resolve with rest.  She admits to a pleuritic chest pain admitting that when she breathes at times the pain gets worse.  Of note since starting dialysis she has required midodrine is taken three times daily for low blood pressure.  Chart review in 2017 she under went a left heart catheterization which showed normal coronaries.  She followed with Dr. Schilling for some time and her last visit with him was seen via televisit in April 2020.  Past Medical History:  Diagnosis Date   Anemia    Anxiety    Chronic bronchitis (HCC)    Chronic kidney disease    Hypertension    Increased frequency of headaches    Morbid obesity (HCC)    Necrotizing fasciitis (HCC)    Sleep apnea    does not use CPAP - - gastric bypass   Type II diabetes mellitus (HCC)    no meds, diet controlled    Past Surgical History:  Procedure  Laterality Date   A/V SHUNT INTERVENTION N/A 11/17/2023   Procedure: A/V SHUNT INTERVENTION;  Surgeon: Tobie Gordy POUR, MD;  Location: [facility] INVASIVE CV LAB;  Service: Cardiovascular;  Laterality: N/A;  80% venous anastomosis   AV FISTULA PLACEMENT Left 05/02/2023   Procedure: LEFT ARM ARTERIOVENOUS (AV) FISTULA  GRAFT;  Surgeon: Pearline Norman RAMAN, MD;  Location: [facility] OR;  Service: Vascular;  Laterality: Left;   BARIATRIC SURGERY     BREAST REDUCTION SURGERY  03/21/2017   CARDIAC CATHETERIZATION  01/06/2016   CARDIAC CATHETERIZATION N/A 01/06/2016   Procedure: Left Heart Cath and Coronary Angiography;  Surgeon: Deatrice DELENA Cage, MD;  Location: [facility] INVASIVE CV LAB;  Service: Cardiovascular;  Laterality: N/A;   CESAREAN SECTION  08/2014   I & D EXTREMITY Right 06/18/2022   Procedure: IRRIGATION AND DEBRIDEMENT RIGHT ABDOMEN AND THIGH;  Surgeon: Kinsinger, Herlene Righter, MD;  Location: [facility] OR;  Service: General;  Laterality: Right;   I & D EXTREMITY Right 06/20/2022   Procedure: WOUND EXPLORATION OF RIGHT THIGH AND RIGHT GROIN WITH IRRIGATION AND DEBRIDEMENT;  Surgeon: Curvin Deward MOULD, MD;  Location: [facility] OR;  Service: General;  Laterality: Right;   IR AV DIALY SHUNT INTRO NEEDLE/INTRACATH INITIAL W/PTA/IMG LEFT  10/19/2023   IR DIALY SHUNT INTRO NEEDLE/INTRACATH INITIAL W/IMG LEFT Left 11/29/2023   REDUCTION MAMMAPLASTY Bilateral 03/21/2017   TRANSESOPHAGEAL ECHOCARDIOGRAM (CATH LAB) N/A 10/16/2023   Procedure: TRANSESOPHAGEAL ECHOCARDIOGRAM;  Surgeon: O'Neal,  Darryle Ned, MD;  Location: [facility] INVASIVE CV LAB;  Service: Cardiovascular;  Laterality: N/A;       Scheduled Meds:  calcitRIOL   0.5 mcg Oral Daily   heparin   5,000 Units Subcutaneous Q12H   metoprolol  tartrate  25 mg Oral BID   Continuous Infusions:  PRN Meds: acetaminophen , morphine  injection, ondansetron  (ZOFRAN ) IV  Allergies:   No Known Allergies  Social History:   Social History   Socioeconomic History   Marital status: Married    Spouse  name: Not on file   Number of children: Not on file   Years of education: Not on file   Highest education level: Not on file  Occupational History   Not on file  Tobacco Use   Smoking status: Never   Smokeless tobacco: Never  Vaping Use   Vaping status: Never Used  Substance and Sexual Activity   Alcohol use: No   Drug use: No   Sexual activity: Yes    Birth control/protection: None  Other Topics Concern   Not on file  Social History Narrative   Not on file   Social Drivers of Health   Financial Resource Strain: Not on file  Food Insecurity: No Food Insecurity (02/08/2024)   Hunger Vital Sign    Worried About Running Out of Food in the Last Year: Never true    Ran Out of Food in the Last Year: Never true  Transportation Needs: No Transportation Needs (02/08/2024)   PRAPARE - Administrator, Civil Service (Medical): No    Lack of Transportation (Non-Medical): No  Physical Activity: Not on file  Stress: Not on file  Social Connections: Socially Integrated (10/11/2023)   Social Connection and Isolation Panel    Frequency of Communication with Friends and Family: More than three times a week    Frequency of Social Gatherings with Friends and Family: More than three times a week    Attends Religious Services: 1 to 4 times per year    Active Member of [employer] or Organizations: Yes    Attends Engineer, structural: More than 4 times per year    Marital Status: Married  Catering manager Violence: Not At Risk (02/08/2024)   Humiliation, Afraid, Rape, and Kick questionnaire    Fear of Current or Ex-Partner: No    Emotionally Abused: No    Physically Abused: No    Sexually Abused: No    Family History:    Family History  Problem Relation Age of Onset   Diabetes Mother    Hypertension Mother    Thyroid  disease Mother    Kidney disease Maternal Grandmother    Diabetes Maternal Grandmother    Heart attack Other      ROS:  Please see the history of present  illness.  Chest pain, shortness of breath All other ROS reviewed and negative.     Physical Exam/Data: Vitals:   02/08/24 0918 02/08/24 0930 02/08/24 1325 02/08/24 1627  BP: 125/81 (!) 152/85 128/81 (!) 155/70  Pulse: 95 94 93 93  Resp: 17 20 15 19   Temp: 98.2 F (36.8 C)  98.3 F (36.8 C) 98.3 F (36.8 C)  TempSrc: Oral  Oral Oral  SpO2: 100% 100% 100% 97%  Weight:      Height:       No intake or output data in the 24 hours ending 02/08/24 1845    02/08/2024    7:58 AM 12/30/2023    3:48 PM 12/30/2023   11:56  AM  Last 3 Weights  Weight (lbs) 168 lb 160 lb 11.5 oz 161 lb 9.6 oz  Weight (kg) 76.204 kg 72.9 kg 73.3 kg     Body mass index is 27.96 kg/m.  General:  Well nourished, well developed, in no acute distress HEENT: normal Neck: no JVD Vascular: No carotid bruits; Distal pulses 2+ bilaterally Cardiac:  normal S1, S2; RRR; +2/6 holosystolic murmur   Lungs:  clear to auscultation bilaterally, no wheezing, rhonchi or rales  Abd: soft, nontender, no hepatomegaly  Ext: no edema Musculoskeletal:  No deformities, BUE and BLE strength normal and equal Skin: warm and dry  Neuro:  CNs 2-12 intact, no focal abnormalities noted Psych:  Normal affect   EKG:  The EKG was personally reviewed and demonstrates:   Telemetry:  Telemetry was personally reviewed and demonstrates:    Relevant CV Studies: Echo, cardiac cath and TEE  Laboratory Data: High Sensitivity Troponin:  No results for input(s): TROPONINIHS in the last 720 hours.   Chemistry Recent Labs  Lab 02/08/24 0802  NA 141  K 4.0  CL 99  CO2 23  GLUCOSE 67*  BUN 56*  CREATININE 8.14*  CALCIUM  7.5*  GFRNONAA 6*  ANIONGAP 19*    No results for input(s): PROT, ALBUMIN , AST, ALT, ALKPHOS, BILITOT in the last 168 hours. Lipids No results for input(s): CHOL, TRIG, HDL, LABVLDL, LDLCALC, CHOLHDL in the last 168 hours.  Hematology Recent Labs  Lab 02/08/24 0802  WBC 6.3  RBC 3.17*   HGB 10.0*  HCT 31.1*  MCV 98.1  MCH 31.5  MCHC 32.2  RDW 15.0  PLT 236   Thyroid  No results for input(s): TSH, FREET4 in the last 168 hours.  BNPNo results for input(s): BNP, PROBNP in the last 168 hours.  DDimer No results for input(s): DDIMER in the last 168 hours.  Radiology/Studies:  DG Chest 2 View Result Date: 02/08/2024 CLINICAL DATA:  Left chest pain, causing failure to complete dialysis today. Shortness of breath. EXAM: CHEST - 2 VIEW COMPARISON:  12/30/2023 FINDINGS: Old left rib deformities. Cardiac and mediastinal margins appear normal. Thoracic spondylosis. Potential mild airway thickening better seen in the right infrahilar region. No airspace opacity. No blunting of the costophrenic angles. IMPRESSION: 1. Potential mild airway thickening, better seen in the right infrahilar region. This could reflect bronchitis or reactive airways disease. 2. Old left rib deformities. 3. Thoracic spondylosis. Electronically Signed   By: Ryan Salvage M.D.   On: 02/08/2024 09:44    Assessment and Plan: Chest pain, Atypical, sounds plueritic Hypertension - but has required midodrine since starting Hemodialysis  Type 2 Diabetes  Hyperlipidemia  ESRD on HD   Her chest pain is atypical with the pleuritic component and given the fact that she recently traveled it would be best to rule out pulmonary embolism in this patient.  If pulmonary embolism is ruled out and she still continue to experience symptoms then we can proceed with an ischemic evaluation. Her blood pressure appears to be intermittently high she is on midodrine but tells me he gets lower/hypotensive post hemodialysis.  I am going to place a repeat echocardiogram to reassess her LV function if there is any noted wall motion abnormalities we will proceed with those ischemic evaluations.  Will get hemoglobin A1c and lipid profile.   Risk Assessment/Risk Scores:              For questions or updates, please  contact [facility] Please consult [email or web address] for contact  info under    Signed, Sally Reimers, DO  02/08/2024 6:45 PM

## 2024-02-09 ENCOUNTER — Observation Stay (HOSPITAL_COMMUNITY)

## 2024-02-09 DIAGNOSIS — N189 Chronic kidney disease, unspecified: Secondary | ICD-10-CM | POA: Diagnosis not present

## 2024-02-09 DIAGNOSIS — G8929 Other chronic pain: Secondary | ICD-10-CM | POA: Diagnosis not present

## 2024-02-09 DIAGNOSIS — N186 End stage renal disease: Secondary | ICD-10-CM

## 2024-02-09 DIAGNOSIS — R071 Chest pain on breathing: Secondary | ICD-10-CM | POA: Diagnosis not present

## 2024-02-09 DIAGNOSIS — Z833 Family history of diabetes mellitus: Secondary | ICD-10-CM | POA: Diagnosis not present

## 2024-02-09 DIAGNOSIS — Z8739 Personal history of other diseases of the musculoskeletal system and connective tissue: Secondary | ICD-10-CM | POA: Diagnosis not present

## 2024-02-09 DIAGNOSIS — I132 Hypertensive heart and chronic kidney disease with heart failure and with stage 5 chronic kidney disease, or end stage renal disease: Secondary | ICD-10-CM | POA: Diagnosis not present

## 2024-02-09 DIAGNOSIS — Y712 Prosthetic and other implants, materials and accessory cardiovascular devices associated with adverse incidents: Secondary | ICD-10-CM | POA: Diagnosis present

## 2024-02-09 DIAGNOSIS — I120 Hypertensive chronic kidney disease with stage 5 chronic kidney disease or end stage renal disease: Secondary | ICD-10-CM | POA: Diagnosis not present

## 2024-02-09 DIAGNOSIS — R0789 Other chest pain: Secondary | ICD-10-CM | POA: Diagnosis not present

## 2024-02-09 DIAGNOSIS — I129 Hypertensive chronic kidney disease with stage 1 through stage 4 chronic kidney disease, or unspecified chronic kidney disease: Secondary | ICD-10-CM | POA: Diagnosis not present

## 2024-02-09 DIAGNOSIS — Z79899 Other long term (current) drug therapy: Secondary | ICD-10-CM | POA: Diagnosis not present

## 2024-02-09 DIAGNOSIS — Z8349 Family history of other endocrine, nutritional and metabolic diseases: Secondary | ICD-10-CM | POA: Diagnosis not present

## 2024-02-09 DIAGNOSIS — F419 Anxiety disorder, unspecified: Secondary | ICD-10-CM | POA: Diagnosis not present

## 2024-02-09 DIAGNOSIS — E1140 Type 2 diabetes mellitus with diabetic neuropathy, unspecified: Secondary | ICD-10-CM | POA: Diagnosis not present

## 2024-02-09 DIAGNOSIS — T82511A Breakdown (mechanical) of surgically created arteriovenous shunt, initial encounter: Secondary | ICD-10-CM | POA: Diagnosis not present

## 2024-02-09 DIAGNOSIS — R079 Chest pain, unspecified: Secondary | ICD-10-CM | POA: Diagnosis not present

## 2024-02-09 DIAGNOSIS — Z992 Dependence on renal dialysis: Secondary | ICD-10-CM

## 2024-02-09 DIAGNOSIS — R778 Other specified abnormalities of plasma proteins: Secondary | ICD-10-CM | POA: Diagnosis not present

## 2024-02-09 DIAGNOSIS — E1122 Type 2 diabetes mellitus with diabetic chronic kidney disease: Secondary | ICD-10-CM | POA: Diagnosis not present

## 2024-02-09 DIAGNOSIS — T82590A Other mechanical complication of surgically created arteriovenous fistula, initial encounter: Secondary | ICD-10-CM | POA: Diagnosis not present

## 2024-02-09 DIAGNOSIS — L84 Corns and callosities: Secondary | ICD-10-CM | POA: Diagnosis not present

## 2024-02-09 DIAGNOSIS — R072 Precordial pain: Secondary | ICD-10-CM | POA: Diagnosis not present

## 2024-02-09 DIAGNOSIS — I10 Essential (primary) hypertension: Secondary | ICD-10-CM | POA: Diagnosis not present

## 2024-02-09 DIAGNOSIS — I5032 Chronic diastolic (congestive) heart failure: Secondary | ICD-10-CM | POA: Diagnosis not present

## 2024-02-09 DIAGNOSIS — I959 Hypotension, unspecified: Secondary | ICD-10-CM | POA: Diagnosis not present

## 2024-02-09 DIAGNOSIS — D631 Anemia in chronic kidney disease: Secondary | ICD-10-CM | POA: Diagnosis not present

## 2024-02-09 DIAGNOSIS — R7989 Other specified abnormal findings of blood chemistry: Secondary | ICD-10-CM | POA: Diagnosis not present

## 2024-02-09 DIAGNOSIS — I2489 Other forms of acute ischemic heart disease: Secondary | ICD-10-CM | POA: Diagnosis not present

## 2024-02-09 DIAGNOSIS — E785 Hyperlipidemia, unspecified: Secondary | ICD-10-CM | POA: Diagnosis not present

## 2024-02-09 DIAGNOSIS — Z6827 Body mass index (BMI) 27.0-27.9, adult: Secondary | ICD-10-CM | POA: Diagnosis not present

## 2024-02-09 DIAGNOSIS — J42 Unspecified chronic bronchitis: Secondary | ICD-10-CM | POA: Diagnosis not present

## 2024-02-09 DIAGNOSIS — E875 Hyperkalemia: Secondary | ICD-10-CM | POA: Diagnosis not present

## 2024-02-09 DIAGNOSIS — Z8249 Family history of ischemic heart disease and other diseases of the circulatory system: Secondary | ICD-10-CM | POA: Diagnosis not present

## 2024-02-09 DIAGNOSIS — E11610 Type 2 diabetes mellitus with diabetic neuropathic arthropathy: Secondary | ICD-10-CM | POA: Diagnosis not present

## 2024-02-09 DIAGNOSIS — R918 Other nonspecific abnormal finding of lung field: Secondary | ICD-10-CM | POA: Diagnosis not present

## 2024-02-09 LAB — PHOSPHORUS: Phosphorus: 8.9 mg/dL — ABNORMAL HIGH (ref 2.5–4.6)

## 2024-02-09 LAB — CBC WITH DIFFERENTIAL/PLATELET
Abs Immature Granulocytes: 0.01 K/uL (ref 0.00–0.07)
Basophils Absolute: 0.1 K/uL (ref 0.0–0.1)
Basophils Relative: 1 %
Eosinophils Absolute: 0.4 K/uL (ref 0.0–0.5)
Eosinophils Relative: 6 %
HCT: 31.4 % — ABNORMAL LOW (ref 36.0–46.0)
Hemoglobin: 9.8 g/dL — ABNORMAL LOW (ref 12.0–15.0)
Immature Granulocytes: 0 %
Lymphocytes Relative: 39 %
Lymphs Abs: 2.5 K/uL (ref 0.7–4.0)
MCH: 31.7 pg (ref 26.0–34.0)
MCHC: 31.2 g/dL (ref 30.0–36.0)
MCV: 101.6 fL — ABNORMAL HIGH (ref 80.0–100.0)
Monocytes Absolute: 0.5 K/uL (ref 0.1–1.0)
Monocytes Relative: 7 %
Neutro Abs: 3 K/uL (ref 1.7–7.7)
Neutrophils Relative %: 47 %
Platelets: 126 K/uL — ABNORMAL LOW (ref 150–400)
RBC: 3.09 MIL/uL — ABNORMAL LOW (ref 3.87–5.11)
RDW: 15 % (ref 11.5–15.5)
WBC: 6.4 K/uL (ref 4.0–10.5)
nRBC: 0 % (ref 0.0–0.2)

## 2024-02-09 LAB — HEMOGLOBIN A1C
Hgb A1c MFr Bld: 4.3 % — ABNORMAL LOW (ref 4.8–5.6)
Mean Plasma Glucose: 76.71 mg/dL

## 2024-02-09 LAB — LIPID PANEL
Cholesterol: 86 mg/dL (ref 0–200)
HDL: 38 mg/dL — ABNORMAL LOW (ref >40)
LDL Cholesterol: 41 mg/dL (ref 0–99)
Total CHOL/HDL Ratio: 2.3 ratio
Triglycerides: 33 mg/dL (ref <150)
VLDL: 7 mg/dL (ref 0–40)

## 2024-02-09 LAB — COMPREHENSIVE METABOLIC PANEL WITH GFR
ALT: 26 U/L (ref 0–44)
AST: 33 U/L (ref 15–41)
Albumin: 2.9 g/dL — ABNORMAL LOW (ref 3.5–5.0)
Alkaline Phosphatase: 61 U/L (ref 38–126)
Anion gap: 18 — ABNORMAL HIGH (ref 5–15)
BUN: 62 mg/dL — ABNORMAL HIGH (ref 6–20)
CO2: 20 mmol/L — ABNORMAL LOW (ref 22–32)
Calcium: 6.9 mg/dL — ABNORMAL LOW (ref 8.9–10.3)
Chloride: 101 mmol/L (ref 98–111)
Creatinine, Ser: 9.13 mg/dL — ABNORMAL HIGH (ref 0.44–1.00)
GFR, Estimated: 5 mL/min — ABNORMAL LOW (ref >60)
Glucose, Bld: 102 mg/dL — ABNORMAL HIGH (ref 70–99)
Potassium: 4.3 mmol/L (ref 3.5–5.1)
Sodium: 139 mmol/L (ref 135–145)
Total Bilirubin: 1.1 mg/dL (ref 0.0–1.2)
Total Protein: 6.1 g/dL — ABNORMAL LOW (ref 6.5–8.1)

## 2024-02-09 LAB — C-REACTIVE PROTEIN: CRP: 0.6 mg/dL (ref <1.0)

## 2024-02-09 LAB — ECHOCARDIOGRAM COMPLETE
Area-P 1/2: 3.03 cm2
Est EF: >75
Height: 65 in
S' Lateral: 2.1 cm
Weight: 2688 [oz_av]

## 2024-02-09 LAB — SEDIMENTATION RATE: Sed Rate: 22 mm/h (ref 0–22)

## 2024-02-09 LAB — MAGNESIUM: Magnesium: 2.3 mg/dL (ref 1.7–2.4)

## 2024-02-09 LAB — HEPATITIS B SURFACE ANTIGEN: Hepatitis B Surface Ag: NONREACTIVE

## 2024-02-09 MED ORDER — HYDROMORPHONE HCL 1 MG/ML IJ SOLN
0.5000 mg | Freq: Four times a day (QID) | INTRAMUSCULAR | Status: DC | PRN
  Administered 2024-02-09 – 2024-02-15 (×21): 0.5 mg via INTRAVENOUS
  Filled 2024-02-09 (×21): qty 1

## 2024-02-09 MED ORDER — TECHNETIUM TO 99M ALBUMIN AGGREGATED
4.4000 | Freq: Once | INTRAVENOUS | Status: AC | PRN
  Administered 2024-02-09: 4.4 via INTRAVENOUS

## 2024-02-09 MED ORDER — CHLORHEXIDINE GLUCONATE CLOTH 2 % EX PADS
6.0000 | MEDICATED_PAD | Freq: Every day | CUTANEOUS | Status: DC
  Administered 2024-02-10 – 2024-02-15 (×6): 6 via TOPICAL

## 2024-02-09 NOTE — Progress Notes (Signed)
 Transition of Care [facility]) - Inpatient Brief Assessment   Patient Details  Name: Jacqueline [name] MRN: [id number] Date of Birth: Mar 11, 1981  [facility]) CM/SW Contact:    Rosaline JONELLE Joe, RN Phone Number: 02/09/2024, 4:25 PM   Clinical Narrative: Patient admitted with CP.  Patient is HD patient and receives HD on Tue, Thurs., Saturday.  Patient is pending fistula-gram today and other tests pending at this time including VQ scan to r/o pericarditis, and pending MRI of right foot since patient unable to bear weight.  No IP Care management needs at this time.  Please place [facility] consult as needs arise.   Transition of Care Asessment: Insurance and Status: (P) Insurance coverage has been reviewed Patient has primary care physician: (P) Yes Home environment has been reviewed: (P) from home with spouse Prior level of function:: (P) Cane, RW, HD patient on Tue, thurs, Sat Prior/Current Home Services: (P) No current home services Social Drivers of Health Review: (P) SDOH reviewed no interventions necessary Readmission risk has been reviewed: (P) Yes Transition of care needs: (P) no transition of care needs at this time

## 2024-02-09 NOTE — Consult Note (Signed)
 Chief Complaint:  CKD III unable to use AV graft  Procedure: Fistulagram with possible declot  Referring Provider(s): Dr. Mario Blanch  Supervising Physician: Jenna Hacker  Patient Status: [facility] - In-pt  History of Present Illness: Jacqueline [name] is a 43 y.o. female with a history of HTN, T2DM and CKD III on dialysis with LUE AV graft placed on 05/02/2023. She presented to the ED on 7/24 with complaints of left-sided chest pain worse with inspiration. Patient is currently on a T/Th/Sat dialysis schedule. Team attempted to dialyze on 7/24, but were unsuccessful. Patient reports being stuck 4 times, each time resulting in the catheter becoming clotted and a large clot being pulled out. States she was able to get dialysis on Tuesday without any difficulty. She is known to IR from previous fistulagrams in April and May of this year and has undergone balloon angioplasty twice as well. IR consulted for fistulagram with possible intervention.  Patient is resting in bed in no acute distress. Admits to some continued chest pain, but denies any other symptoms. States she had some chicken broth around 8:45am this morning, but nothing since. All questions and concerns answered at the bedside.   Patient is Full Code  Past Medical History:  Diagnosis Date   Anemia    Anxiety    Chronic bronchitis (HCC)    Chronic kidney disease    Diabetes (HCC) 01/01/2014   Hypertension    Increased frequency of headaches    Morbid obesity (HCC)    Necrotizing fasciitis (HCC)    Sleep apnea    does not use CPAP - - gastric bypass   Type II diabetes mellitus (HCC)    no meds, diet controlled    Past Surgical History:  Procedure Laterality Date   A/V SHUNT INTERVENTION N/A 11/17/2023   Procedure: A/V SHUNT INTERVENTION;  Surgeon: Blanch Gordy POUR, MD;  Location: [facility] INVASIVE CV LAB;  Service: Cardiovascular;  Laterality: N/A;  80% venous anastomosis   AV FISTULA PLACEMENT Left 05/02/2023   Procedure: LEFT ARM  ARTERIOVENOUS (AV) FISTULA  GRAFT;  Surgeon: Pearline Norman RAMAN, MD;  Location: [facility] OR;  Service: Vascular;  Laterality: Left;   BARIATRIC SURGERY     BREAST REDUCTION SURGERY  03/21/2017   CARDIAC CATHETERIZATION  01/06/2016   CARDIAC CATHETERIZATION N/A 01/06/2016   Procedure: Left Heart Cath and Coronary Angiography;  Surgeon: Deatrice DELENA Cage, MD;  Location: [facility] INVASIVE CV LAB;  Service: Cardiovascular;  Laterality: N/A;   CESAREAN SECTION  08/2014   I & D EXTREMITY Right 06/18/2022   Procedure: IRRIGATION AND DEBRIDEMENT RIGHT ABDOMEN AND THIGH;  Surgeon: Kinsinger, Herlene Righter, MD;  Location: [facility] OR;  Service: General;  Laterality: Right;   I & D EXTREMITY Right 06/20/2022   Procedure: WOUND EXPLORATION OF RIGHT THIGH AND RIGHT GROIN WITH IRRIGATION AND DEBRIDEMENT;  Surgeon: Curvin Deward MOULD, MD;  Location: [facility] OR;  Service: General;  Laterality: Right;   IR AV DIALY SHUNT INTRO NEEDLE/INTRACATH INITIAL W/PTA/IMG LEFT  10/19/2023   IR DIALY SHUNT INTRO NEEDLE/INTRACATH INITIAL W/IMG LEFT Left 11/29/2023   REDUCTION MAMMAPLASTY Bilateral 03/21/2017   TRANSESOPHAGEAL ECHOCARDIOGRAM (CATH LAB) N/A 10/16/2023   Procedure: TRANSESOPHAGEAL ECHOCARDIOGRAM;  Surgeon: Barbaraann Darryle Ned, MD;  Location: [facility] INVASIVE CV LAB;  Service: Cardiovascular;  Laterality: N/A;    Allergies: Patient has no known allergies.  Medications: Prior to Admission medications   Medication Sig Start Date End Date Taking? Authorizing Provider  calcitRIOL  (ROCALTROL ) 0.5 MCG capsule Take 0.5 mcg  by mouth daily. 10/03/23   [provider]  furosemide  (LASIX ) 40 MG tablet Take 40 mg by mouth 2 (two) times daily. 11/15/23   [provider]  metoprolol  tartrate (LOPRESSOR ) 25 MG tablet Take 1 tablet (25 mg total) by mouth 2 (two) times daily. 10/20/23 01/18/24  [place], Eric J, DO  Vitamin D , Ergocalciferol , (DRISDOL ) 1.25 MG (50000 UNIT) CAPS capsule Take 1 capsule (50,000 Units total) by mouth every Thursday.  07/07/22   Angiulli, Toribio PARAS, PA-C     Family History  Problem Relation Age of Onset   Diabetes Mother    Hypertension Mother    Thyroid  disease Mother    Kidney disease Maternal Grandmother    Diabetes Maternal Grandmother    Heart attack Other     Social History   Socioeconomic History   Marital status: Married    Spouse name: Not on file   Number of children: Not on file   Years of education: Not on file   Highest education level: Not on file  Occupational History   Not on file  Tobacco Use   Smoking status: Never   Smokeless tobacco: Never  Vaping Use   Vaping status: Never Used  Substance and Sexual Activity   Alcohol use: No   Drug use: No   Sexual activity: Yes    Birth control/protection: None  Other Topics Concern   Not on file  Social History Narrative   Not on file   Social Drivers of Health   Financial Resource Strain: Not on file  Food Insecurity: No Food Insecurity (02/08/2024)   Hunger Vital Sign    Worried About Running Out of Food in the Last Year: Never true    Ran Out of Food in the Last Year: Never true  Transportation Needs: No Transportation Needs (02/08/2024)   PRAPARE - Administrator, Civil Service (Medical): No    Lack of Transportation (Non-Medical): No  Physical Activity: Not on file  Stress: Not on file  Social Connections: Socially Integrated (10/11/2023)   Social Connection and Isolation Panel    Frequency of Communication with Friends and Family: More than three times a week    Frequency of Social Gatherings with Friends and Family: More than three times a week    Attends Religious Services: 1 to 4 times per year    Active Member of [employer] or Organizations: Yes    Attends Engineer, structural: More than 4 times per year    Marital Status: Married     Review of Systems  Cardiovascular:  Positive for chest pain (improving).  Patient denies any headache, shortness of breath, abdominal pain, N/V, or  fever/chills. All other systems are negative.   Vital Signs: BP 109/75 (BP Location: Right Arm)   Pulse 79   Temp 98.1 F (36.7 C) (Oral)   Resp 19   Ht 5' 5 (1.651 m)   Wt 168 lb (76.2 kg)   LMP 01/28/2024 (Approximate)   SpO2 100%   BMI 27.96 kg/m    Physical Exam Vitals reviewed.  Constitutional:      Appearance: Normal appearance.  HENT:     Head: Normocephalic and atraumatic.     Mouth/Throat:     Mouth: Mucous membranes are moist.     Pharynx: Oropharynx is clear.  Cardiovascular:     Rate and Rhythm: Normal rate.  Pulmonary:     Effort: Pulmonary effort is normal.  Skin:    General: Skin is  warm and dry.     Comments: LUE AV graft without palpable thrill but has an audible bruit  Neurological:     General: No focal deficit present.     Mental Status: She is alert and oriented to person, place, and time.  Psychiatric:        Mood and Affect: Mood normal.        Behavior: Behavior normal.     Imaging: DG Foot Complete Right Result Date: 02/08/2024 CLINICAL DATA:  Right-sided foot pain EXAM: DG FOOT COMPLETE 3+V*R* COMPARISON:  06/18/2022 FINDINGS: Vascular calcifications. No acute fracture is seen. Chronic deformities of the metatarsals. Advanced irregular arthropathy of the midfoot, progressive as compared with 2023. Chronic linear 6 mm foreign body within the plantar soft tissues of the midfoot. Interval protuberant soft tissue swelling along the plantar aspect of the midfoot. No soft tissue gas. IMPRESSION: 1. No acute osseous abnormality. 2. 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 Chronic linear foreign body within the plantar soft tissues of the midfoot. Interval protuberant soft tissue swelling along the plantar aspect of the midfoot suggesting an ulcer or soft tissue mass, correlate with direct inspection. Electronically Signed   By: Luke Bun M.D.   On: 02/08/2024 20:01    DG Chest 2 View Result Date: 02/08/2024 CLINICAL DATA:  Left chest pain, causing failure to complete dialysis today. Shortness of breath. EXAM: CHEST - 2 VIEW COMPARISON:  12/30/2023 FINDINGS: Old left rib deformities. Cardiac and mediastinal margins appear normal. Thoracic spondylosis. Potential mild airway thickening better seen in the right infrahilar region. No airspace opacity. No blunting of the costophrenic angles. IMPRESSION: 1. Potential mild airway thickening, better seen in the right infrahilar region. This could reflect bronchitis or reactive airways disease. 2. Old left rib deformities. 3. Thoracic spondylosis. Electronically Signed   By: Ryan Salvage M.D.   On: 02/08/2024 09:44    Labs:  CBC: Recent Labs    11/29/23 0853 12/30/23 0630 12/30/23 0744 02/08/24 0802 02/09/24 0250  WBC 7.6 6.9  --  6.3 6.4  HGB 9.0* 9.9* 8.5* 10.0* 9.8*  HCT 28.1* 28.4* 25.0* 31.1* 31.4*  PLT 317 291  --  236 126*    COAGS: No results for input(s): INR, APTT in the last 8760 hours.  BMP: Recent Labs    10/20/23 0308 12/30/23 0630 12/30/23 0744 02/08/24 0802 02/09/24 0250  NA 138 137 141 141 139  K 3.6 3.6 3.3* 4.0 4.3  CL 103 108 116* 99 101  CO2 25 <7*  --  23 20*  GLUCOSE 170* 101* 89 67* 102*  BUN 20 143* >130* 56* 62*  CALCIUM  7.0* 7.2*  --  7.5* 6.9*  CREATININE 2.78* 15.40* 16.70* 8.14* 9.13*  GFRNONAA 21* 3*  --  6* 5*    LIVER FUNCTION TESTS: Recent Labs    10/20/23 0308 12/30/23 0630 02/08/24 1932 02/09/24 0250  BILITOT  --  0.9 0.8 1.1  AST  --  14* 29 33  ALT  --  13 26 26   ALKPHOS  --  90 60 61  PROT  --  7.7 5.7* 6.1*  ALBUMIN  2.4* 3.6 2.8* 2.9*    TUMOR MARKERS: No results for input(s): AFPTM, CEA, CA199, CHROMGRNA in the last 8760 hours.  Assessment and Plan:  CKD on hemodialysis: Jacqueline [name] is a 44 y.o. female with a history of AV graft malfunction s/p 2 balloon angioplasties in April and May of  this year. Patient attempted  to undergo dialysis on 7/24 which resulted in multiple clots being pulled from access site. IR consulted for fistulagram with possible intervention.  -NPO since 9am  -BUN/Cr minimally increased to 62/9.13 today -ECHO completed, but pending read -VSS, additional labs reviewed -Plan for fistulagram with possible intervention in IR   Risks and benefits of the dialysis circuit study were discussed with the patient including, but not limited to bleeding, infection, vascular injury, and inability to improve the function of the fistula/graft which would require placement of a tunneled dialysis catheter.  All of the patient's questions were answered, patient is agreeable to proceed. Consent signed and in chart.   Thank you for allowing our service to participate in [facility] 's care.    Electronically Signed: Glennon CHRISTELLA Bal, PA-C   02/09/2024, 9:57 AM     I spent a total of 20 Minutes in face to face in clinical consultation, greater than 50% of which was counseling/coordinating care for fistulagram with possible intervention.

## 2024-02-09 NOTE — Progress Notes (Signed)
 PROGRESS NOTE  Jacqueline [name]  FMW:[id number] DOB: [birth year] DOA: 02/08/2024 PCP: [name], MD  Consultants  Brief Narrative: 43 y.o. female with a PMH significant for  hypertension bronchitis diabetes chronic kidney disease, necrotizing fasciitis.  Patient presents ED with complaints of left-sided chest pain sharp intermittent worse with breathing and taking deep inspiration no orthopnea no leg edema.  Patient went to dialysis where she was also experiencing recurrent and persistent progressive chest pain and was sent to the emergency room.  They did attempt to dialyze her today using her AV graft on her left arm as patient is a Tuesday Thursday Saturday schedule and after multiple attempts there were unable to.  Patient has had graft stenosis and clots in the past.  Patient also reports that she has had pain in the sole of her right foot that has been progressively getting worse.  She has no sensation in her right leg as she had a history of necrotizing fasciitis and now has developed a large callus-like area that is severe and deep.  Patient states that bearing weight on it is made it very difficult as his walking.  No complaints of shortness of breath or cough.    Assessment & Plan: >> End-stage renal disease on hemodialysis: History of AV graft malfunction with IR fistulogram and balloon angioplasty in April 2025.  IR has been consulted for suspected AV graft malfunction as they were not able to access day prior to admission - Patient remains clinically stable without signs of volume overload - Spoke with Dr. Susannah from nephrology who plans to see patient today.   >> AV graft  malfunction: IR plans to take patient back for fistulogram with possible intervention in IR today.    >> Chest pain: -Some pleuritic chest pain. - VQ scan is pending.  Did have travel to [place] but this was almost 2 months ago back beginning of June - Echo also pending. - Sed rate/CRP are pending,  evaluation for pericarditis.   >> Essential hypertension: Home regimen consists of amlodipine  and hydralazine  as well as metoprolol . - She has been hypotensive and is on midodrine for HD induced hypotension.  All antihypertensives have been stopped.   >> Diabetes mellitus type 2: Per patient diabetes has resolved, will continue with every 6 hour Accu-Cheks and coverage.   >> Right foot pain: Large callus on the bottom of her foot  - She is relatively insensate for her entire right leg secondary to prior history of necrotizing fasciitis. - That said she does have pain when directly palpating over her callus. - X-ray showed chronic advanced regular arthropathy of the midfoot, chronic linear foreign body, protuberant soft tissue swelling. - Will obtain MRI of foot.  Cannot do gadolinium due to ESRD   >> Chronic diastolic congestive heart failure: Patient is euvolemic volume management through hemodialysis.         DVT prophylaxis:  heparin  injection 5,000 Units Start: 02/08/24 2200  Code Status:   Code Status: Full Code Level of care: Telemetry Cardiac Status is: Observation  Consults called: Cardiology, nephrology  Subjective: Patient awake and alert on exam today.  Still complaining of left-sided chest pain.  Also with right foot pain when she tries to bear weight.  Objective: Vitals:   02/08/24 2159 02/09/24 0107 02/09/24 0425 02/09/24 0756  BP:  (!) 99/59 (!) 101/59 109/75  Pulse: 100 71 74 79  Resp:  19 19 19   Temp:  98.6 F (37 C) 97.7 F (36.5 C)  98.1 F (36.7 C)  TempSrc:  Oral Oral Oral  SpO2:  100% 100% 100%  Weight:      Height:       No intake or output data in the 24 hours ending 02/09/24 1517 Filed Weights   02/08/24 0758  Weight: 76.2 kg   Body mass index is 27.96 kg/m.  Gen: 43 y.o. female in no apparent distress.  Nontoxic Pulm: Non-labored breathing.  Clear to auscultation bilaterally.  CV: Regular rate and rhythm.  Grade 3 systolic ejection  murmur noted. Chest: Nontender to palpation left sternal border today. GI: Abdomen soft, nondistended and nontender. Ext: Warm, trace bilateral ankle edema.  She does have 5 cm in diameter hypopigmented circular lesion right foot appearing to be a callus.  This is notably tender to palpation however. Skin: No rashes, lesions  Neuro: Alert and oriented. No focal neurological deficits. Psych: Calm  Judgement and insight appear normal. Mood & affect appropriate.     I have personally reviewed the following labs and images: CBC: Recent Labs  Lab 02/08/24 0802 02/09/24 0250  WBC 6.3 6.4  NEUTROABS  --  3.0  HGB 10.0* 9.8*  HCT 31.1* 31.4*  MCV 98.1 101.6*  PLT 236 126*   BMP &GFR Recent Labs  Lab 02/08/24 0802 02/09/24 0250  NA 141 139  K 4.0 4.3  CL 99 101  CO2 23 20*  GLUCOSE 67* 102*  BUN 56* 62*  CREATININE 8.14* 9.13*  CALCIUM  7.5* 6.9*  MG  --  2.3  PHOS  --  8.9*   Estimated Creatinine Clearance: 8 mL/min (A) (by C-G formula based on SCr of 9.13 mg/dL (H)). Liver & Pancreas: Recent Labs  Lab 02/08/24 1932 02/09/24 0250  AST 29 33  ALT 26 26  ALKPHOS 60 61  BILITOT 0.8 1.1  PROT 5.7* 6.1*  ALBUMIN  2.8* 2.9*   No results for input(s): LIPASE, AMYLASE in the last 168 hours. No results for input(s): AMMONIA in the last 168 hours. Diabetic: Recent Labs    02/09/24 0250  HGBA1C 4.3*   No results for input(s): GLUCAP in the last 168 hours. Cardiac Enzymes: No results for input(s): CKTOTAL, CKMB, CKMBINDEX, TROPONINI in the last 168 hours. No results for input(s): PROBNP in the last 8760 hours. Coagulation Profile: No results for input(s): INR, PROTIME in the last 168 hours. Thyroid  Function Tests: No results for input(s): TSH, T4TOTAL, FREET4, T3FREE, THYROIDAB in the last 72 hours. Lipid Profile: Recent Labs    02/09/24 0250  CHOL 86  HDL 38*  LDLCALC 41  TRIG 33  CHOLHDL 2.3   Anemia Panel: No results for  input(s): VITAMINB12, FOLATE, FERRITIN, TIBC, IRON, RETICCTPCT in the last 72 hours. Urine analysis:    Component Value Date/Time   COLORURINE YELLOW 10/13/2023 1728   APPEARANCEUR HAZY (A) 10/13/2023 1728   LABSPEC 1.014 10/13/2023 1728   PHURINE 7.0 10/13/2023 1728   GLUCOSEU 150 (A) 10/13/2023 1728   HGBUR SMALL (A) 10/13/2023 1728   BILIRUBINUR NEGATIVE 10/13/2023 1728   KETONESUR NEGATIVE 10/13/2023 1728   PROTEINUR >=300 (A) 10/13/2023 1728   UROBILINOGEN 0.2 08/21/2012 2130   NITRITE NEGATIVE 10/13/2023 1728   LEUKOCYTESUR MODERATE (A) 10/13/2023 1728   Sepsis Labs: Invalid input(s): PROCALCITONIN, LACTICIDVEN  Microbiology: No results found for this or any previous visit (from the past 240 hours).  Radiology Studies: NM Pulmonary Perfusion Result Date: 02/09/2024 CLINICAL DATA:  Chest pain, nonspecific EXAM: NUCLEAR MEDICINE PERFUSION LUNG SCAN TECHNIQUE: Perfusion images were  obtained in multiple projections after intravenous injection of radiopharmaceutical. Ventilation scans intentionally deferred if perfusion scan and chest x-ray adequate for interpretation RADIOPHARMACEUTICALS:  4.4 mCi Tc-85m MAA IV COMPARISON:  Chest radiograph February 08, 2024., pulmonary perfusion scan August 17, 2020 FINDINGS: No suspicious perfusion abnormality or filling defect identified. Normal distribution of radiotracer activity throughout both lungs. IMPRESSION: Normal perfusion exam. Electronically Signed   By: Duwaine Severs M.D.   On: 02/09/2024 14:58   DG Foot Complete Right Result Date: 02/08/2024 CLINICAL DATA:  Right-sided foot pain EXAM: DG FOOT COMPLETE 3+V*R* COMPARISON:  06/18/2022 FINDINGS: Vascular calcifications. No acute fracture is seen. Chronic deformities of the metatarsals. Advanced irregular arthropathy of the midfoot, progressive as compared with 2023. Chronic linear 6 mm foreign body within the plantar soft tissues of the midfoot. Interval protuberant soft tissue  swelling along the plantar aspect of the midfoot. No soft tissue gas. IMPRESSION: 1. No acute osseous abnormality. 2. 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 Chronic linear foreign body within the plantar soft tissues of the midfoot. Interval protuberant soft tissue swelling along the plantar aspect of the midfoot suggesting an ulcer or soft tissue mass, correlate with direct inspection. Electronically Signed   By: Luke Bun M.D.   On: 02/08/2024 20:01    Scheduled Meds:  calcitRIOL   0.5 mcg Oral Daily   heparin   5,000 Units Subcutaneous Q12H   metoprolol  tartrate  25 mg Oral BID   Continuous Infusions:   LOS: 0 days   35 minutes with more than 50% spent in reviewing records, counseling patient/family and coordinating care.  Reyes VEAR Gaw, MD [facility] [email or web address] 02/09/2024, 3:17 PM

## 2024-02-09 NOTE — Progress Notes (Signed)
  Echocardiogram 2D Echocardiogram has been performed.  Jacqueline [name] 02/09/2024, 10:14 AM

## 2024-02-09 NOTE — Progress Notes (Addendum)
 Rounding Note   Patient Name: Jacqueline [name] Date of Encounter: 02/09/2024  [facility] Cardiologist: Lynwood Schilling, MD   Subjective Patient continues to have some atypical chest pain this morning that is pleuritic in nature. Also reproducible with palpation of chest wall. However, she also states it is worse when she gets up and walks. She also describes some orthopnea and feels like she gets short of breath when talking. She was not able to have her dialysis session yesterday due to AV fistula malfunction.  Scheduled Meds:  calcitRIOL   0.5 mcg Oral Daily   heparin   5,000 Units Subcutaneous Q12H   metoprolol  tartrate  25 mg Oral BID   Continuous Infusions:  PRN Meds: acetaminophen , nitroGLYCERIN , ondansetron  (ZOFRAN ) IV   Vital Signs  Vitals:   02/08/24 1928 02/08/24 2159 02/09/24 0107 02/09/24 0425  BP: 122/75  (!) 99/59 (!) 101/59  Pulse: 100 100 71 74  Resp: 18  19 19   Temp: 98.2 F (36.8 C)  98.6 F (37 C) 97.7 F (36.5 C)  TempSrc: Oral  Oral Oral  SpO2: 100%  100% 100%  Weight:      Height:       No intake or output data in the 24 hours ending 02/09/24 0724    02/08/2024    7:58 AM 12/30/2023    3:48 PM 12/30/2023   11:56 AM  Last 3 Weights  Weight (lbs) 168 lb 160 lb 11.5 oz 161 lb 9.6 oz  Weight (kg) 76.204 kg 72.9 kg 73.3 kg      Telemetry Normal sinus rhythm with rates in the 70s to 80s. - Personally Reviewed  ECG  Normal sinus rhythm with no acute ischemic changes. - Personally Reviewed  Physical Exam GEN: No acute distress.   Neck: No JVD. Cardiac: RRR. No murmurs, rubs, or gallops. Tender with palpation of left chest.  MS: Mild lower extremity edema bilaterally. No deformities. Neuro:  No focal deficits. Psych: Normal affect. Responds appropriately.  Labs High Sensitivity Troponin:  No results for input(s): TROPONINIHS in the last 720 hours.   Chemistry Recent Labs  Lab 02/08/24 0802 02/08/24 1932 02/09/24 0250  NA 141  --   139  K 4.0  --  4.3  CL 99  --  101  CO2 23  --  20*  GLUCOSE 67*  --  102*  BUN 56*  --  62*  CREATININE 8.14*  --  9.13*  CALCIUM  7.5*  --  6.9*  MG  --   --  2.3  PROT  --  5.7* 6.1*  ALBUMIN   --  2.8* 2.9*  AST  --  29 33  ALT  --  26 26  ALKPHOS  --  60 61  BILITOT  --  0.8 1.1  GFRNONAA 6*  --  5*  ANIONGAP 19*  --  18*    Lipids  Recent Labs  Lab 02/09/24 0250  CHOL 86  TRIG 33  HDL 38*  LDLCALC 41  CHOLHDL 2.3    Hematology Recent Labs  Lab 02/08/24 0802 02/09/24 0250  WBC 6.3 6.4  RBC 3.17* 3.09*  HGB 10.0* 9.8*  HCT 31.1* 31.4*  MCV 98.1 101.6*  MCH 31.5 31.7  MCHC 32.2 31.2  RDW 15.0 15.0  PLT 236 126*   Thyroid  No results for input(s): TSH, FREET4 in the last 168 hours.  BNPNo results for input(s): BNP, PROBNP in the last 168 hours.  DDimer No results for input(s): DDIMER in the last  168 hours.   Radiology  DG Foot Complete Right Result Date: 02/08/2024 CLINICAL DATA:  Right-sided foot pain EXAM: DG FOOT COMPLETE 3+V*R* COMPARISON:  06/18/2022 FINDINGS: Vascular calcifications. No acute fracture is seen. Chronic deformities of the metatarsals. Advanced irregular arthropathy of the midfoot, progressive as compared with 2023. Chronic linear 6 mm foreign body within the plantar soft tissues of the midfoot. Interval protuberant soft tissue swelling along the plantar aspect of the midfoot. No soft tissue gas. IMPRESSION: 1. No acute osseous abnormality. 2. 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 Chronic linear foreign body within the plantar soft tissues of the midfoot. Interval protuberant soft tissue swelling along the plantar aspect of the midfoot suggesting an ulcer or soft tissue mass, correlate with direct inspection. Electronically Signed   By: Luke Bun M.D.   On: 02/08/2024 20:01   DG Chest 2 View Result Date: 02/08/2024 CLINICAL DATA:  Left chest  pain, causing failure to complete dialysis today. Shortness of breath. EXAM: CHEST - 2 VIEW COMPARISON:  12/30/2023 FINDINGS: Old left rib deformities. Cardiac and mediastinal margins appear normal. Thoracic spondylosis. Potential mild airway thickening better seen in the right infrahilar region. No airspace opacity. No blunting of the costophrenic angles. IMPRESSION: 1. Potential mild airway thickening, better seen in the right infrahilar region. This could reflect bronchitis or reactive airways disease. 2. Old left rib deformities. 3. Thoracic spondylosis. Electronically Signed   By: Ryan Salvage M.D.   On: 02/08/2024 09:44    Cardiac Studies  Echocardiogram 10/12/2023: Impressions: 1. Left ventricular ejection fraction, by estimation, is 60 to 65%. The  left ventricle has normal function. The left ventricle has no regional  wall motion abnormalities. There is moderate left ventricular hypertrophy.  Left ventricular diastolic  parameters are consistent with Grade I diastolic dysfunction (impaired  relaxation).   2. Right ventricular systolic function is normal. The right ventricular  size is normal.   3. The mitral valve is normal in structure. Trivial mitral valve  regurgitation. No evidence of mitral stenosis.   4. The aortic valve is normal in structure. Aortic valve regurgitation is  not visualized. No aortic stenosis is present.   5. The inferior vena cava is normal in size with greater than 50%  respiratory variability, suggesting right atrial pressure of 3 mmHg.   Patient Profile   43 y.o. obese African-American female with a history of normal coronaries on cardiac catheterization in 2017, chronic diastolic CHF, hypertension, type 2 diabetes mellitus, ESRD on T/ Th/ Sat,  obstructive sleep apnea, necrotizing fascitis, and pre-eclampsia who was admitted on 02/08/2024 for chest pain.  Assessment & Plan   Atypical Chest Pain Patient presented with atypical chest pain with a  pleuritic component. She recently traveled to [place]. EKG showed no acute ischemic changes. High-sensitivity 59 >> 43 not consistent with ACS.  - She continues to have atypical left-sided chest pain this morning that is pleuritic in nature. It is also worse with palpation. However, she does worsening pain with exertion as well. - EKG this morning shows not acute ischemic.  - Echo pending.  - V/Q scan has also been ordered to rule out PE given recent travel.  - Chest pain sounds very atypical. Will wait for Echo and V/Q scan results. Will also check CRP and Sed Rate to assess for possible pericarditis.   Chronic Diastolic CHF Echo in 09/2023 showed LVEF of 60-65% with moderate LVH and grade 1 diastolic  dysfunction. - Does not appear significantly volume overload on exam. - Volume status managed via dialysis.   Hypertension BP soft this morning but stable. - Will stop Lopressor . Patient states this was stopped about a month ago due to low BP after starting dialysis.  - She reports being on Midodrine three times daily at home but I don't see this on her PTA medication list. Will defer this to primary team.  ESRD Started on dialysis in 09/2023. Currently received dialysis T/ Th/ Sat. However, was not able to have dialysis session yesterday due to AV fistula malfunction. - Management per Nephrology.  Otherwise, per primary team: - Type 2 diabetes mellitus - Sleep apnea - Chronic anemia - Anxiety    For questions or updates, please contact [facility] Please consult [email or web address] for contact info under     Signed, Callie E Goodrich, PA-C  02/09/2024, 7:24 AM    Patient seen and examined, note reviewed with the signed Advanced Practice Provider. I personally reviewed laboratory data, imaging studies and relevant notes. I independently examined the patient and formulated the important aspects of the plan. I have personally discussed the plan with the patient and/or family.  Comments or changes to the note/plan are indicated below.  Patient seen and examined at her bedside. She reports same pain bit offers no other complaints.   Atypical chest pain  Chronic diastolic heart failure  Hypertension  ESRD  Type 2 Diabetes mellitus  Chronic Anemia   Clinical picture is atypical for anginal. She is pending work up for PE V/Q scan ordered by primary team.  Echo pending.  Agree with getting the CRP and Sed rate Please stop beta blocker today due to low blood pressure - she is also on midodrine due to HD induced hypotension  HD per nephro  DM 2 per primary team   We will continue to follow with you  Asharia Lotter DO, MS [facility] Attending Cardiologist [facility]  [address] [place], [place] [address] [phone] Website: [email or web address]

## 2024-02-09 NOTE — Consult Note (Signed)
 Renal Service Consult Note [facility]  [facility] 02/09/2024 Jacqueline JONETTA Fret, MD Requesting Physician: Dr. Elpidio  Reason for Consult: ESRD patient with malfunctioning AV graft HPI: The patient is a 43 y.o. year-old w/ PMH as below who presented to ED with chest pain under her left breast yesterday morning.  He was at dialysis that same morning they were unable to complete the dialysis she was having access issues.  He was having some fevers also and a wound on her right foot.  In the ED heart rate was 104, BP stable and respiratory rate stable.  Creatinine 8.1, BUN 56, potassium 4.0, WBC 6K, Hgb 10.  Chest x-ray was negative.  Patient was admitted, we are asked to see for ESRD.    Pt seen in room.  She states she got a full dialysis on Tuesday but on Thursday they did not see a the graft was clotted but they are unable to access it.  They stuck her 4 times and got clot material and return each time.  She has been seen by IR in April and in May. In may they did a fistulogram which showed no treatable problems, no stenosis no thrombosis.  Patient has no other complaints, no leg swelling or shortness of breath.   ROS - denies CP, no joint pain, no HA, no blurry vision, no rash, no diarrhea, no nausea/ vomiting   Past Medical History  Past Medical History:  Diagnosis Date   Anemia    Anxiety    Chronic bronchitis (HCC)    Chronic kidney disease    Diabetes (HCC) 01/01/2014   Hypertension    Increased frequency of headaches    Morbid obesity (HCC)    Necrotizing fasciitis (HCC)    Sleep apnea    does not use CPAP - - gastric bypass   Type II diabetes mellitus (HCC)    no meds, diet controlled   Past Surgical History  Past Surgical History:  Procedure Laterality Date   A/V SHUNT INTERVENTION N/A 11/17/2023   Procedure: A/V SHUNT INTERVENTION;  Surgeon: Tobie Gordy POUR, MD;  Location: [facility] INVASIVE CV LAB;  Service: Cardiovascular;  Laterality: N/A;  80% venous  anastomosis   AV FISTULA PLACEMENT Left 05/02/2023   Procedure: LEFT ARM ARTERIOVENOUS (AV) FISTULA  GRAFT;  Surgeon: Pearline Norman RAMAN, MD;  Location: [facility] OR;  Service: Vascular;  Laterality: Left;   BARIATRIC SURGERY     BREAST REDUCTION SURGERY  03/21/2017   CARDIAC CATHETERIZATION  01/06/2016   CARDIAC CATHETERIZATION N/A 01/06/2016   Procedure: Left Heart Cath and Coronary Angiography;  Surgeon: Deatrice DELENA Cage, MD;  Location: [facility] INVASIVE CV LAB;  Service: Cardiovascular;  Laterality: N/A;   CESAREAN SECTION  08/2014   I & D EXTREMITY Right 06/18/2022   Procedure: IRRIGATION AND DEBRIDEMENT RIGHT ABDOMEN AND THIGH;  Surgeon: Kinsinger, Herlene Righter, MD;  Location: [facility] OR;  Service: General;  Laterality: Right;   I & D EXTREMITY Right 06/20/2022   Procedure: WOUND EXPLORATION OF RIGHT THIGH AND RIGHT GROIN WITH IRRIGATION AND DEBRIDEMENT;  Surgeon: Curvin Deward MOULD, MD;  Location: [facility] OR;  Service: General;  Laterality: Right;   IR AV DIALY SHUNT INTRO NEEDLE/INTRACATH INITIAL W/PTA/IMG LEFT  10/19/2023   IR DIALY SHUNT INTRO NEEDLE/INTRACATH INITIAL W/IMG LEFT Left 11/29/2023   REDUCTION MAMMAPLASTY Bilateral 03/21/2017   TRANSESOPHAGEAL ECHOCARDIOGRAM (CATH LAB) N/A 10/16/2023   Procedure: TRANSESOPHAGEAL ECHOCARDIOGRAM;  Surgeon: Barbaraann Darryle Ned, MD;  Location: [facility] INVASIVE CV LAB;  Service:  Cardiovascular;  Laterality: N/A;   Family History  Family History  Problem Relation Age of Onset   Diabetes Mother    Hypertension Mother    Thyroid  disease Mother    Kidney disease Maternal Grandmother    Diabetes Maternal Grandmother    Heart attack Other    Social History  reports that she has never smoked. She has never used smokeless tobacco. She reports that she does not drink alcohol and does not use drugs. Allergies No Known Allergies Home medications Prior to Admission medications   Medication Sig Start Date End Date Taking? Authorizing Provider  acetaminophen  (TYLENOL ) 500 MG tablet  Take 1,000 mg by mouth 2 (two) times daily as needed for moderate pain (pain score 4-6), fever or headache.   Yes [provider]  calcitRIOL  (ROCALTROL ) 0.5 MCG capsule Take 1.5 mcg by mouth Every Tuesday,Thursday,and Saturday with dialysis. Prior to dialysis on Tuesday, Thursday, Saturdays only 10/03/23  Yes [provider]  calcium  acetate (PHOSLO ) 667 MG capsule Take 1,334 mg by mouth 3 (three) times daily with meals.   Yes [provider]  furosemide  (LASIX ) 40 MG tablet Take 80 mg by mouth daily. 11/15/23  Yes [provider]  midodrine (PROAMATINE) 5 MG tablet Take 5 mg by mouth 3 (three) times daily with meals.   Yes [provider]  Vitamin D , Ergocalciferol , (DRISDOL ) 1.25 MG (50000 UNIT) CAPS capsule Take 1 capsule (50,000 Units total) by mouth every Thursday. 07/07/22  Yes Angiulli, Toribio PARAS, PA-C  metoprolol  tartrate (LOPRESSOR ) 25 MG tablet Take 1 tablet (25 mg total) by mouth 2 (two) times daily. Patient not taking: Reported on 02/09/2024 10/20/23 01/18/24  [place], Eric J, [place]     Vitals:   02/08/24 2159 02/09/24 0107 02/09/24 0425 02/09/24 0756  BP:  (!) 99/59 (!) 101/59 109/75  Pulse: 100 71 74 79  Resp:  19 19 19   Temp:  98.6 F (37 C) 97.7 F (36.5 C) 98.1 F (36.7 C)  TempSrc:  Oral Oral Oral  SpO2:  100% 100% 100%  Weight:      Height:       Exam Gen alert, no distress No rash, cyanosis or gangrene Sclera anicteric, throat clear  No jvd or bruits Chest clear bilat to bases, no rales/ wheezing RRR no MRG Abd soft ntnd no mass or ascites +bs GU deferred MS no joint effusions or deformity Ext no LE or UE edema, no other edema Neuro is alert, Ox 3 , nf    LUA AVG+bruit   Home bp meds: Lasix  80 every day Metoprolol  25 bid Midodrine 5mg  tid   OP HD: [facility] 4h   B500   73kg  3K bath  AVG  Heparin  3000 Gets to dry wt  Assessment/ Plan: AV graft malfunction: IR has seen the patient today and planning to do a  fistulogram. ESRD: on HD TTS, last HD 7/22 at outpatient unit.  Plan HD after IR intervention. BP: takes midodrine and metoprolol  at home. BP' 90s- 100s here, appear she has chronically low blood pressures.  Volume: no vol excess on exam. Standing wt to 73kg.  Anemia of esrd: Hb 9-11 here, follow.        Jacqueline Fret  MD [facility] 02/09/2024, 3:59 PM  Recent Labs  Lab 02/08/24 0802 02/08/24 1932 02/09/24 0250  HGB 10.0*  --  9.8*  ALBUMIN   --  2.8* 2.9*  CALCIUM  7.5*  --  6.9*  PHOS  --   --  8.9*  CREATININE 8.14*  --  9.13*  K 4.0  --  4.3   Inpatient medications:  calcitRIOL   0.5 mcg Oral Daily   heparin   5,000 Units Subcutaneous Q12H    acetaminophen , HYDROmorphone  (DILAUDID ) injection, nitroGLYCERIN , ondansetron  (ZOFRAN ) IV

## 2024-02-09 NOTE — Plan of Care (Signed)
   Problem: Education: Goal: Knowledge of General Education information will improve Description: Including pain rating scale, medication(s)/side effects and non-pharmacologic comfort measures Outcome: Progressing   Problem: Clinical Measurements: Goal: Diagnostic test results will improve Outcome: Progressing   Problem: Activity: Goal: Risk for activity intolerance will decrease Outcome: Progressing

## 2024-02-10 ENCOUNTER — Inpatient Hospital Stay (HOSPITAL_COMMUNITY)

## 2024-02-10 DIAGNOSIS — R072 Precordial pain: Secondary | ICD-10-CM | POA: Diagnosis not present

## 2024-02-10 DIAGNOSIS — I5032 Chronic diastolic (congestive) heart failure: Secondary | ICD-10-CM | POA: Diagnosis not present

## 2024-02-10 DIAGNOSIS — N186 End stage renal disease: Secondary | ICD-10-CM | POA: Diagnosis not present

## 2024-02-10 DIAGNOSIS — I120 Hypertensive chronic kidney disease with stage 5 chronic kidney disease or end stage renal disease: Secondary | ICD-10-CM | POA: Diagnosis not present

## 2024-02-10 DIAGNOSIS — R600 Localized edema: Secondary | ICD-10-CM | POA: Diagnosis not present

## 2024-02-10 DIAGNOSIS — I2489 Other forms of acute ischemic heart disease: Secondary | ICD-10-CM | POA: Diagnosis not present

## 2024-02-10 DIAGNOSIS — M14671 Charcot's joint, right ankle and foot: Secondary | ICD-10-CM | POA: Diagnosis not present

## 2024-02-10 DIAGNOSIS — G4730 Sleep apnea, unspecified: Secondary | ICD-10-CM

## 2024-02-10 DIAGNOSIS — R071 Chest pain on breathing: Secondary | ICD-10-CM | POA: Diagnosis not present

## 2024-02-10 HISTORY — PX: IR FLUORO GUIDE CV LINE RIGHT: IMG2283

## 2024-02-10 HISTORY — PX: IR US GUIDE VASC ACCESS RIGHT: IMG2390

## 2024-02-10 LAB — RENAL FUNCTION PANEL
Albumin: 2.8 g/dL — ABNORMAL LOW (ref 3.5–5.0)
Anion gap: 17 — ABNORMAL HIGH (ref 5–15)
BUN: 66 mg/dL — ABNORMAL HIGH (ref 6–20)
CO2: 20 mmol/L — ABNORMAL LOW (ref 22–32)
Calcium: 6.9 mg/dL — ABNORMAL LOW (ref 8.9–10.3)
Chloride: 102 mmol/L (ref 98–111)
Creatinine, Ser: 10.22 mg/dL — ABNORMAL HIGH (ref 0.44–1.00)
GFR, Estimated: 4 mL/min — ABNORMAL LOW (ref >60)
Glucose, Bld: 103 mg/dL — ABNORMAL HIGH (ref 70–99)
Phosphorus: 10.4 mg/dL — ABNORMAL HIGH (ref 2.5–4.6)
Potassium: 4.8 mmol/L (ref 3.5–5.1)
Sodium: 139 mmol/L (ref 135–145)

## 2024-02-10 LAB — HEPATITIS B SURFACE ANTIBODY, QUANTITATIVE: Hep B S AB Quant (Post): <3.5 m[IU]/mL — ABNORMAL LOW

## 2024-02-10 LAB — CBC
HCT: 30.3 % — ABNORMAL LOW (ref 36.0–46.0)
Hemoglobin: 9.7 g/dL — ABNORMAL LOW (ref 12.0–15.0)
MCH: 31.5 pg (ref 26.0–34.0)
MCHC: 32 g/dL (ref 30.0–36.0)
MCV: 98.4 fL (ref 80.0–100.0)
Platelets: 211 K/uL (ref 150–400)
RBC: 3.08 MIL/uL — ABNORMAL LOW (ref 3.87–5.11)
RDW: 14.6 % (ref 11.5–15.5)
WBC: 5.4 K/uL (ref 4.0–10.5)
nRBC: 0 % (ref 0.0–0.2)

## 2024-02-10 MED ORDER — DILTIAZEM HCL ER COATED BEADS 120 MG PO CP24
120.0000 mg | ORAL_CAPSULE | Freq: Every morning | ORAL | Status: DC
  Administered 2024-02-10 – 2024-02-15 (×6): 120 mg via ORAL
  Filled 2024-02-10 (×6): qty 1

## 2024-02-10 MED ORDER — LIDOCAINE HCL 1 % IJ SOLN
INTRAMUSCULAR | Status: AC
  Filled 2024-02-10: qty 20

## 2024-02-10 MED ORDER — HEPARIN SODIUM (PORCINE) 1000 UNIT/ML IJ SOLN
INTRAMUSCULAR | Status: AC
  Filled 2024-02-10: qty 10

## 2024-02-10 MED ORDER — ALTEPLASE 2 MG IJ SOLR
INTRAMUSCULAR | Status: AC
  Filled 2024-02-10: qty 4

## 2024-02-10 MED ORDER — HEPARIN SODIUM (PORCINE) 1000 UNIT/ML DIALYSIS: 3000.0000 [IU] | Freq: Once | INTRAMUSCULAR | Status: DC

## 2024-02-10 MED ORDER — ALTEPLASE 2 MG IJ SOLR
2.0000 mg | Freq: Once | INTRAMUSCULAR | Status: AC | PRN
  Administered 2024-02-10: 2 mg

## 2024-02-10 MED ORDER — HEPARIN SODIUM (PORCINE) 1000 UNIT/ML IJ SOLN
INTRAMUSCULAR | Status: AC
  Filled 2024-02-10: qty 3

## 2024-02-10 MED ORDER — ALTEPLASE 2 MG IJ SOLR
2.0000 mg | Freq: Once | INTRAMUSCULAR | Status: AC
  Administered 2024-02-10: 2 mg

## 2024-02-10 NOTE — Procedures (Signed)
 Luverne Aran, MD  Physician Radiology   Procedures    Signed   Date of Service: 02/10/2024 11:09 AM Validating that the HD catheter is ready to use for HD treatment

## 2024-02-10 NOTE — Procedures (Signed)
 HD Note:  Some information was entered later than the data was gathered due to patient care needs. The stated time with the data is accurate.  Received patient in bed to unit.   Alert and oriented.   Informed consent signed and in chart.   Access used: right neck internal jugular HD catheter Access issues: Arterial pressures were outside of pressure limits.  Interventions for remedy were flushing with NS, reversing the lines, decreasing the BFR to 300.  None of these were effective. Cathflow placed to dwell for 45 min.  This was unsuccessful.  Dr. Geralynn notified.  New order to dwell cathflow until next treatment.  Patient tolerated treatment well.   TX duration: 33 min  Alert, without acute distress.  Total UF removed: 100  Hand-off given to patient's nurse.   Transported back to the room   Rosaelena Kemnitz L. Lenon, RN Kidney Dialysis Unit.

## 2024-02-10 NOTE — Progress Notes (Signed)
 Rounding Note   Patient Name: Jacqueline [name] Date of Encounter: 02/10/2024  [facility] Cardiologist: Lynwood Schilling, MD   Subjective Patient seen and examined at bedside. States chest pain is improving  Scheduled Meds:  calcitRIOL   0.5 mcg Oral Daily   Chlorhexidine  Gluconate Cloth  6 each Topical Q0600   diltiazem   120 mg Oral q morning   heparin   5,000 Units Subcutaneous Q12H   Continuous Infusions:  PRN Meds: acetaminophen , HYDROmorphone  (DILAUDID ) injection, nitroGLYCERIN , ondansetron  (ZOFRAN ) IV   Vital Signs  Vitals:   02/09/24 2336 02/10/24 0359 02/10/24 0746 02/10/24 0910  BP: 110/68 117/68 (!) 134/93 131/78  Pulse: 85 81 85 82  Resp: 18 18 19 12   Temp: 98.1 F (36.7 C) 97.9 F (36.6 C) 98.2 F (36.8 C) 97.6 F (36.4 C)  TempSrc:   Oral   SpO2: 100% 100% 100% 100%  Weight:    75 kg  Height:       No intake or output data in the 24 hours ending 02/10/24 1018    02/10/2024    9:10 AM 02/08/2024    7:58 AM 12/30/2023    3:48 PM  Last 3 Weights  Weight (lbs) 165 lb 5.5 oz 168 lb 160 lb 11.5 oz  Weight (kg) 75 kg 76.204 kg 72.9 kg      Telemetry Not on telemetry. - Personally Reviewed  ECG  No new EKGs- Personally Reviewed  Physical Exam GEN: No acute distress.   Neck: No JVD. Cardiac: RRR. No murmurs, rubs, or gallops. Tender with palpation of left chest.  MS: Mild lower extremity edema bilaterally (ankles). No deformities.  Dialysis access site left upper arm Neuro:  No focal deficits. Psych: Normal affect. Responds appropriately.  Labs High Sensitivity Troponin:  No results for input(s): TROPONINIHS in the last 720 hours.   Chemistry Recent Labs  Lab 02/08/24 0802 02/08/24 1932 02/09/24 0250 02/10/24 0252  NA 141  --  139 139  K 4.0  --  4.3 4.8  CL 99  --  101 102  CO2 23  --  20* 20*  GLUCOSE 67*  --  102* 103*  BUN 56*  --  62* 66*  CREATININE 8.14*  --  9.13* 10.22*  CALCIUM  7.5*  --  6.9* 6.9*  MG  --   --  2.3  --    PROT  --  5.7* 6.1*  --   ALBUMIN   --  2.8* 2.9* 2.8*  AST  --  29 33  --   ALT  --  26 26  --   ALKPHOS  --  60 61  --   BILITOT  --  0.8 1.1  --   GFRNONAA 6*  --  5* 4*  ANIONGAP 19*  --  18* 17*    Lipids  Recent Labs  Lab 02/09/24 0250  CHOL 86  TRIG 33  HDL 38*  LDLCALC 41  CHOLHDL 2.3    Hematology Recent Labs  Lab 02/08/24 0802 02/09/24 0250 02/10/24 0252  WBC 6.3 6.4 5.4  RBC 3.17* 3.09* 3.08*  HGB 10.0* 9.8* 9.7*  HCT 31.1* 31.4* 30.3*  MCV 98.1 101.6* 98.4  MCH 31.5 31.7 31.5  MCHC 32.2 31.2 32.0  RDW 15.0 15.0 14.6  PLT 236 126* 211   Thyroid  No results for input(s): TSH, FREET4 in the last 168 hours.  BNPNo results for input(s): BNP, PROBNP in the last 168 hours.  DDimer No results for input(s): DDIMER in the last  168 hours.   Radiology  ECHOCARDIOGRAM COMPLETE Result Date: 02/09/2024    ECHOCARDIOGRAM REPORT   Patient Name:   Jacqueline [name] Date of Exam: 02/09/2024 Medical Rec #:  [id number]    Height:       65.0 in Accession #:    [id number]   Weight:       168.0 lb Date of Birth:  January 01, 1981    BSA:          1.837 m Patient Age:    43 years     BP:           109/75 mmHg Patient Gender: F            HR:           85 bpm. Exam Location:  Inpatient Procedure: 2D Echo (Both Spectral and Color Flow Doppler were utilized during            procedure). Indications:    chest pain  History:        Patient has prior history of Echocardiogram examinations, most                 recent 10/16/2023. End stage renal disease; Risk Factors:Sleep                 Apnea, Hypertension, Dyslipidemia and Diabetes.  Sonographer:    Tinnie Barefoot RDCS Referring Phys: [id number] KARDIE TOBB IMPRESSIONS  1. Moderate LVOT gradient of that increased to with valsalva. Left ventricular ejection fraction, by estimation, is >75%. The left ventricle has hyperdynamic function. The left ventricle has no regional wall motion abnormalities. There is moderate left ventricular  hypertrophy. Left ventricular diastolic parameters are consistent with Grade I diastolic dysfunction (impaired relaxation).  2. Right ventricular systolic function is normal. The right ventricular size is normal.  3. Mild chordal SAM noted. The mitral valve is normal in structure. Mild mitral valve regurgitation. No evidence of mitral stenosis.  4. The aortic valve is normal in structure. Aortic valve regurgitation is not visualized. No aortic stenosis is present.  5. The inferior vena cava is normal in size with greater than 50% respiratory variability, suggesting right atrial pressure of 3 mmHg. FINDINGS  Left Ventricle: Moderate LVOT gradient of that increased to with valsalva. Left ventricular ejection fraction, by estimation, is >75%. The left ventricle has hyperdynamic function. The left ventricle has no regional wall motion abnormalities. The left ventricular internal cavity size was normal in size. There is moderate left ventricular hypertrophy. Left ventricular diastolic parameters are consistent with Grade I diastolic dysfunction (impaired relaxation). Right Ventricle: The right ventricular size is normal. No increase in right ventricular wall thickness. Right ventricular systolic function is normal. Left Atrium: Left atrial size was normal in size. Right Atrium: Right atrial size was normal in size. Pericardium: There is no evidence of pericardial effusion. Mitral Valve: Mild chordal SAM noted. The mitral valve is normal in structure. Mild mitral valve regurgitation. No evidence of mitral valve stenosis. Tricuspid Valve: The tricuspid valve is normal in structure. Tricuspid valve regurgitation is not demonstrated. No evidence of tricuspid stenosis. Aortic Valve: The aortic valve is normal in structure. Aortic valve regurgitation is not visualized. No aortic stenosis is present. Pulmonic Valve: The pulmonic valve was normal in structure. Pulmonic valve regurgitation is not visualized. No  evidence of pulmonic stenosis. Aorta: The aortic root is normal in size and structure. Venous: The inferior vena cava is normal in size with greater than 50% respiratory  variability, suggesting right atrial pressure of 3 mmHg. IAS/Shunts: No atrial level shunt detected by color flow Doppler.  LEFT VENTRICLE PLAX 2D LVIDd:         4.30 cm   Diastology LVIDs:         2.10 cm   LV e' medial:    6.42 cm/s LV PW:         1.00 cm   LV E/e' medial:  12.0 LV IVS:        1.20 cm   LV e' lateral:   8.81 cm/s LVOT diam:     1.80 cm   LV E/e' lateral: 8.8 LV SV:         51 LV SV Index:   28 LVOT Area:     2.54 cm  RIGHT VENTRICLE             IVC RV Basal diam:  2.40 cm     IVC diam: 1.60 cm RV S prime:     15.00 cm/s TAPSE (M-mode): 1.9 cm LEFT ATRIUM           Index        RIGHT ATRIUM           Index LA diam:      3.50 cm 1.91 cm/m   RA Area:     13.90 cm LA Vol (A4C): 37.8 ml 20.58 ml/m  RA Volume:   32.10 ml  17.48 ml/m  AORTIC VALVE LVOT Vmax:   105.00 cm/s LVOT Vmean:  74.100 cm/s LVOT VTI:    0.200 m  AORTA Ao Root diam: 3.00 cm Ao Asc diam:  3.00 cm MITRAL VALVE MV Area (PHT): 3.03 cm    SHUNTS MV Decel Time: 250 msec    Systemic VTI:  0.20 m MV E velocity: 77.10 cm/s  Systemic Diam: 1.80 cm MV A velocity: 79.60 cm/s MV E/A ratio:  0.97 Morene Brownie Electronically signed by Morene Brownie Signature Date/Time: 02/09/2024/5:00:00 PM    Final    NM Pulmonary Perfusion Result Date: 02/09/2024 CLINICAL DATA:  Chest pain, nonspecific EXAM: NUCLEAR MEDICINE PERFUSION LUNG SCAN TECHNIQUE: Perfusion images were obtained in multiple projections after intravenous injection of radiopharmaceutical. Ventilation scans intentionally deferred if perfusion scan and chest x-ray adequate for interpretation RADIOPHARMACEUTICALS:  4.4 mCi Tc-5m MAA IV COMPARISON:  Chest radiograph February 08, 2024., pulmonary perfusion scan August 17, 2020 FINDINGS: No suspicious perfusion abnormality or filling defect identified. Normal  distribution of radiotracer activity throughout both lungs. IMPRESSION: Normal perfusion exam. Electronically Signed   By: Duwaine Severs M.D.   On: 02/09/2024 14:58   DG Foot Complete Right Result Date: 02/08/2024 CLINICAL DATA:  Right-sided foot pain EXAM: DG FOOT COMPLETE 3+V*R* COMPARISON:  06/18/2022 FINDINGS: Vascular calcifications. No acute fracture is seen. Chronic deformities of the metatarsals. Advanced irregular arthropathy of the midfoot, progressive as compared with 2023. Chronic linear 6 mm foreign body within the plantar soft tissues of the midfoot. Interval protuberant soft tissue swelling along the plantar aspect of the midfoot. No soft tissue gas. IMPRESSION: 1. No acute osseous abnormality. 2. 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 Chronic linear foreign body within the plantar soft tissues of the midfoot. Interval protuberant soft tissue swelling along the plantar aspect of the midfoot suggesting an ulcer or soft tissue mass, correlate with direct inspection. Electronically Signed   By: Luke Bun M.D.   On: 02/08/2024 20:01    Cardiac  Studies  Echocardiogram 10/12/2023: Impressions: 1. Left ventricular ejection fraction, by estimation, is 60 to 65%. The  left ventricle has normal function. The left ventricle has no regional  wall motion abnormalities. There is moderate left ventricular hypertrophy.  Left ventricular diastolic  parameters are consistent with Grade I diastolic dysfunction (impaired  relaxation).   2. Right ventricular systolic function is normal. The right ventricular  size is normal.   3. The mitral valve is normal in structure. Trivial mitral valve  regurgitation. No evidence of mitral stenosis.   4. The aortic valve is normal in structure. Aortic valve regurgitation is  not visualized. No aortic stenosis is present.   5. The inferior vena cava is normal in size with  greater than 50%  respiratory variability, suggesting right atrial pressure of 3 mmHg.   02/09/2024  1. Moderate LVOT gradient of that increased to with  valsalva. Left ventricular ejection fraction, by estimation, is >75%. The  left ventricle has hyperdynamic function. The left ventricle has no  regional wall motion abnormalities. There is  moderate left ventricular hypertrophy. Left ventricular diastolic  parameters are consistent with Grade I diastolic dysfunction (impaired  relaxation).   2. Right ventricular systolic function is normal. The right ventricular  size is normal.   3. Mild chordal SAM noted. The mitral valve is normal in structure. Mild  mitral valve regurgitation. No evidence of mitral stenosis.   4. The aortic valve is normal in structure. Aortic valve regurgitation is  not visualized. No aortic stenosis is present.   5. The inferior vena cava is normal in size with greater than 50%  respiratory variability, suggesting right atrial pressure of 3 mmHg.   Patient Profile   43 y.o. obese African-American female with a history of normal coronaries on cardiac catheterization in 2017, chronic diastolic CHF, hypertension, type 2 diabetes mellitus, ESRD on T/ Th/ Sat,  obstructive sleep apnea, necrotizing fascitis, and pre-eclampsia who was admitted on 02/08/2024 for chest pain.  Assessment & Plan   Atypical Chest Pain Present on arrival, improving Pleuritic component, worse with palpation, also noticeable with exertion Echocardiogram: Hyperdynamic LVEF 75%, LVOT gradient 36 mmHg that increases to 40 mmHg with Valsalva, mild chordal SAM, mild mitral regurgitation findings suggestive of obstructive HCM Given the recent travel to [place] VQ scan ordered to rule out ventilation/perfusion defect.  Per report normal perfusion High since troponins elevated but flat not consistent with ACS ESR and CRP are within normal limits Lopressor  was stopped approximately a month  ago due to low blood pressures at dialysis. Will restart Cardizem  120 mg p.o. daily given hyperdynamic LVEF and obstructive physiology.  With holding parameters.  Would recommend daily use of Cardizem ; however, if unable to tolerate recommend at least nondialysis days. Recommend further workup as outpatient and even HCM clinic.  Since overall chest discomfort is improving no additional testing warranted at this time. Recommended close outpatient follow-up.   She is advised to come back to the ER if she has worsening chest pain that increases in intensity frequency or duration or has cardiac discomfort.  Patient agreeable with the plan of care.  Chronic Diastolic CHF Echo in 09/2023 showed LVEF of 60-65% with moderate LVH and grade 1 diastolic dysfunction. Echo 01/2024: Hyperdynamic LVEF, grade 1 diastolic dysfunction Volume status managed via dialysis. Uptitration of GDMT limited due to the need for midodrine.  Hypertension w/ ESRD Started on dialysis in 09/2023. Currently received dialysis T/ Th/ Sat. H Seen in dialysis.  Otherwise,  per primary team: - Type 2 diabetes mellitus-reemphasize importance of glycemic control - Sleep apnea - Chronic anemia likely secondary to ESRD - Anxiety   For questions or updates, please contact [facility] Please consult [email or web address] for contact info under   [facility] will sign off.   Medication Recommendations:  continue current.  Other recommendations (labs, testing, etc):  NA Follow up as an outpatient:  follow up w/ Jackee Alberts NP on 03/06/2024 see EMR    Signed, Madonna Large, DO, [facility]  A Division of [facility] [address] [place], [place] [address]  [place], [place] [address]  02/10/2024, 10:18 AM

## 2024-02-10 NOTE — Procedures (Signed)
 Interventional Radiology Procedure Note  Procedure: Non-tunneled HD catheter placement  Complications: None  Estimated Blood Loss: < 10 mL  Findings: Right internal jugular vein 16 cm triple lumen [place] temp HD catheter placement with tip in RA. OK to use.  Marcey DASEN. Luverne, M.D Pager:  [phone]

## 2024-02-10 NOTE — Plan of Care (Signed)
  Problem: Education: Goal: Knowledge of General Education information will improve Description: Including pain rating scale, medication(s)/side effects and non-pharmacologic comfort measures Outcome: Progressing   Problem: Health Behavior/Discharge Planning: Goal: Ability to manage health-related needs will improve Outcome: Progressing   Problem: Clinical Measurements: Goal: Ability to maintain clinical measurements within normal limits will improve Outcome: Progressing   Problem: Activity: Goal: Risk for activity intolerance will decrease Outcome: Progressing   Problem: Health Behavior/Discharge Planning: Goal: Ability to safely manage health-related needs after discharge will improve Outcome: Progressing

## 2024-02-10 NOTE — Plan of Care (Signed)

## 2024-02-10 NOTE — Progress Notes (Signed)
 Unable to successfully cannulate pt for dialysis. MD-[facility] made aware. Pt will be sent to IR for HD line placement.

## 2024-02-10 NOTE — Progress Notes (Signed)
 [facility] Progress Note   Subjective:   Patient seen while we were attempting to start HD. We were unsuccessful with cannulation with her AVF. Patient was taken to IR for a [facility] placement and we plan to do HD later today.   Objective Vitals:   02/09/24 2336 02/10/24 0359 02/10/24 0746 02/10/24 0910  BP: 110/68 117/68 (!) 134/93 131/78  Pulse: 85 81 85 82  Resp: 18 18 19 12   Temp: 98.1 F (36.7 C) 97.9 F (36.6 C) 98.2 F (36.8 C) 97.6 F (36.4 C)  TempSrc:   Oral   SpO2: 100% 100% 100% 100%  Weight:    75 kg  Height:       Exam Gen alert, no distress No rash, cyanosis or gangrene Sclera anicteric, throat clear  No jvd or bruits Chest clear bilat to bases, no rales/ wheezing RRR no MRG Abd soft ntnd no mass or ascites +bs GU deferred MS no joint effusions or deformity Ext no LE or UE edema, no other edema Neuro is alert, Ox 3 , nf    LUA AVG+bruit: L TDC placed 02/10/24  Additional Objective Labs: Basic Metabolic Panel: Recent Labs  Lab 02/08/24 0802 02/09/24 0250 02/10/24 0252  NA 141 139 139  K 4.0 4.3 4.8  CL 99 101 102  CO2 23 20* 20*  GLUCOSE 67* 102* 103*  BUN 56* 62* 66*  CREATININE 8.14* 9.13* 10.22*  CALCIUM  7.5* 6.9* 6.9*  PHOS  --  8.9* 10.4*   Liver Function Tests: Recent Labs  Lab 02/08/24 1932 02/09/24 0250 02/10/24 0252  AST 29 33  --   ALT 26 26  --   ALKPHOS 60 61  --   BILITOT 0.8 1.1  --   PROT 5.7* 6.1*  --   ALBUMIN  2.8* 2.9* 2.8*   No results for input(s): LIPASE, AMYLASE in the last 168 hours. CBC: Recent Labs  Lab 02/08/24 0802 02/09/24 0250 02/10/24 0252  WBC 6.3 6.4 5.4  NEUTROABS  --  3.0  --   HGB 10.0* 9.8* 9.7*  HCT 31.1* 31.4* 30.3*  MCV 98.1 101.6* 98.4  PLT 236 126* 211   Blood Culture    Component Value Date/Time   SDES BLOOD RIGHT ARM 10/15/2023 1421   SDES BLOOD RIGHT HAND 10/15/2023 1421   SPECREQUEST  10/15/2023 1421    BOTTLES DRAWN AEROBIC AND ANAEROBIC Blood Culture  results may not be optimal due to an inadequate volume of blood received in culture bottles   SPECREQUEST  10/15/2023 1421    BOTTLES DRAWN AEROBIC AND ANAEROBIC Blood Culture adequate volume   CULT  10/15/2023 1421    NO GROWTH 5 DAYS Performed at [facility], [address] [place], [place] [address]    CULT  10/15/2023 1421    NO GROWTH 5 DAYS Performed at [facility], [address] [place], [place] [address]    REPTSTATUS 10/20/2023 FINAL 10/15/2023 1421   REPTSTATUS 10/20/2023 FINAL 10/15/2023 1421    ECHOCARDIOGRAM COMPLETE Result Date: 02/09/2024    ECHOCARDIOGRAM REPORT   Patient Name:   RENELL COAXUM Date of Exam: 02/09/2024 Medical Rec #:  [id number]    Height:       65.0 in Accession #:    [id number]   Weight:       168.0 lb Date of Birth:  [birth year]    BSA:          1.837 m Patient Age:    43  years     BP:           109/75 mmHg Patient Gender: F            HR:           85 bpm. Exam Location:  Inpatient Procedure: 2D Echo (Both Spectral and Color Flow Doppler were utilized during            procedure). Indications:    chest pain  History:        Patient has prior history of Echocardiogram examinations, most                 recent 10/16/2023. End stage renal disease; Risk Factors:Sleep                 Apnea, Hypertension, Dyslipidemia and Diabetes.  Sonographer:    Tinnie Barefoot RDCS Referring Phys: [id number] KARDIE TOBB IMPRESSIONS  1. Moderate LVOT gradient of that increased to with valsalva. Left ventricular ejection fraction, by estimation, is >75%. The left ventricle has hyperdynamic function. The left ventricle has no regional wall motion abnormalities. There is moderate left ventricular hypertrophy. Left ventricular diastolic parameters are consistent with Grade I diastolic dysfunction (impaired relaxation).  2. Right ventricular systolic function is normal. The right ventricular size is normal.  3. Mild chordal SAM noted. The mitral valve is normal in  structure. Mild mitral valve regurgitation. No evidence of mitral stenosis.  4. The aortic valve is normal in structure. Aortic valve regurgitation is not visualized. No aortic stenosis is present.  5. The inferior vena cava is normal in size with greater than 50% respiratory variability, suggesting right atrial pressure of 3 mmHg. FINDINGS  Left Ventricle: Moderate LVOT gradient of that increased to with valsalva. Left ventricular ejection fraction, by estimation, is >75%. The left ventricle has hyperdynamic function. The left ventricle has no regional wall motion abnormalities. The left ventricular internal cavity size was normal in size. There is moderate left ventricular hypertrophy. Left ventricular diastolic parameters are consistent with Grade I diastolic dysfunction (impaired relaxation). Right Ventricle: The right ventricular size is normal. No increase in right ventricular wall thickness. Right ventricular systolic function is normal. Left Atrium: Left atrial size was normal in size. Right Atrium: Right atrial size was normal in size. Pericardium: There is no evidence of pericardial effusion. Mitral Valve: Mild chordal SAM noted. The mitral valve is normal in structure. Mild mitral valve regurgitation. No evidence of mitral valve stenosis. Tricuspid Valve: The tricuspid valve is normal in structure. Tricuspid valve regurgitation is not demonstrated. No evidence of tricuspid stenosis. Aortic Valve: The aortic valve is normal in structure. Aortic valve regurgitation is not visualized. No aortic stenosis is present. Pulmonic Valve: The pulmonic valve was normal in structure. Pulmonic valve regurgitation is not visualized. No evidence of pulmonic stenosis. Aorta: The aortic root is normal in size and structure. Venous: The inferior vena cava is normal in size with greater than 50% respiratory variability, suggesting right atrial pressure of 3 mmHg. IAS/Shunts: No atrial level shunt detected by  color flow Doppler.  LEFT VENTRICLE PLAX 2D LVIDd:         4.30 cm   Diastology LVIDs:         2.10 cm   LV e' medial:    6.42 cm/s LV PW:         1.00 cm   LV E/e' medial:  12.0 LV IVS:        1.20 cm  LV e' lateral:   8.81 cm/s LVOT diam:     1.80 cm   LV E/e' lateral: 8.8 LV SV:         51 LV SV Index:   28 LVOT Area:     2.54 cm  RIGHT VENTRICLE             IVC RV Basal diam:  2.40 cm     IVC diam: 1.60 cm RV S prime:     15.00 cm/s TAPSE (M-mode): 1.9 cm LEFT ATRIUM           Index        RIGHT ATRIUM           Index LA diam:      3.50 cm 1.91 cm/m   RA Area:     13.90 cm LA Vol (A4C): 37.8 ml 20.58 ml/m  RA Volume:   32.10 ml  17.48 ml/m  AORTIC VALVE LVOT Vmax:   105.00 cm/s LVOT Vmean:  74.100 cm/s LVOT VTI:    0.200 m  AORTA Ao Root diam: 3.00 cm Ao Asc diam:  3.00 cm MITRAL VALVE MV Area (PHT): 3.03 cm    SHUNTS MV Decel Time: 250 msec    Systemic VTI:  0.20 m MV E velocity: 77.10 cm/s  Systemic Diam: 1.80 cm MV A velocity: 79.60 cm/s MV E/A ratio:  0.97 Morene Brownie Electronically signed by Morene Brownie Signature Date/Time: 02/09/2024/5:00:00 PM    Final    NM Pulmonary Perfusion Result Date: 02/09/2024 CLINICAL DATA:  Chest pain, nonspecific EXAM: NUCLEAR MEDICINE PERFUSION LUNG SCAN TECHNIQUE: Perfusion images were obtained in multiple projections after intravenous injection of radiopharmaceutical. Ventilation scans intentionally deferred if perfusion scan and chest x-ray adequate for interpretation RADIOPHARMACEUTICALS:  4.4 mCi Tc-38m MAA IV COMPARISON:  Chest radiograph February 08, 2024., pulmonary perfusion scan August 17, 2020 FINDINGS: No suspicious perfusion abnormality or filling defect identified. Normal distribution of radiotracer activity throughout both lungs. IMPRESSION: Normal perfusion exam. Electronically Signed   By: Duwaine Severs M.D.   On: 02/09/2024 14:58   DG Foot Complete Right Result Date: 02/08/2024 CLINICAL DATA:  Right-sided foot pain EXAM: DG FOOT COMPLETE 3+V*R*  COMPARISON:  06/18/2022 FINDINGS: Vascular calcifications. No acute fracture is seen. Chronic deformities of the metatarsals. Advanced irregular arthropathy of the midfoot, progressive as compared with 2023. Chronic linear 6 mm foreign body within the plantar soft tissues of the midfoot. Interval protuberant soft tissue swelling along the plantar aspect of the midfoot. No soft tissue gas. IMPRESSION: 1. No acute osseous abnormality. 2. 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 Chronic linear foreign body within the plantar soft tissues of the midfoot. Interval protuberant soft tissue swelling along the plantar aspect of the midfoot suggesting an ulcer or soft tissue mass, correlate with direct inspection. Electronically Signed   By: Luke Bun M.D.   On: 02/08/2024 20:01   Medications:   calcitRIOL   0.5 mcg Oral Daily   Chlorhexidine  Gluconate Cloth  6 each Topical Q0600   diltiazem   120 mg Oral q morning   heparin   5,000 Units Subcutaneous Q12H    Home bp meds: Lasix  80 every day Metoprolol  25 bid Midodrine 5mg  tid    OP HD: [facility]   B500   73kg  3K bath  AVG  Heparin  3000 Gets to dry wt   Assessment/ Plan: AV graft malfunction: Unable to cannulate patient in HD today.  IR made aware and she had a TDC placed. ESRD: on HD TTS, last HD 7/22 at outpatient unit.  We attempted to cannulate her today but we were unsuccessful to do HD. Plan HD after IR TDC intervention. BP: takes midodrine and metoprolol  at home. BP' 90s- 100s here, appear she has chronically low blood pressures.  Volume: no vol excess on exam. Standing wt to 73kg.  Anemia of esrd: Hb 9-11 here, follow Pleuritic chest pain: patient taken to ED due to reported chest pain. VQ showed no suspicious perfusion abnormality or filling defect and normal distribution of radiotracer activity throughout both lungs.  Right foot pain: patient with large  callus on the bottom of her right foot. Hx of necrotizing fasciitis. Xray showed chronic advanced regular arthropathy of the midfoot, chronic linear foreign body, and protuberant soft tissue swelling. MRI pending.    Belvie Och, NP 02/10/2024, 11:16 AM  [facility]

## 2024-02-10 NOTE — Progress Notes (Signed)
 PROGRESS NOTE  Jacqueline [name]  FMW:[id number] DOB: [birth year] DOA: 02/08/2024 PCP: [name], MD  Consultants  Brief Narrative: 43 y.o. female with a PMH significant for hypertension, bronchitis diabetes chronic kidney disease, necrotizing fasciitis.  Came to emergency department with complaints of left-sided sharp chest pain worse with deep inspiration.  On day of admission she had presented to hemodialysis but they were unable to dialyze her today using her AV graft on her left arm.  Patient has history graft stenosis and clots in the past with recannulation most recently April 2025 of this year.  Patient also reports that she has had pain in the sole of her right foot that has been progressively getting worse.  She has no sensation in her right leg as she had a history of necrotizing fasciitis and now has developed a large callus-like area that is severe and deep.  Patient states that bearing weight on it is made it very difficult as is walking.  No complaints of shortness of breath or cough.    Assessment & Plan:  Chest pain: - Reason for admission.  Describes mostly as pleuritic chest pain. - Did have travel to [place] but this was almost 2 months ago back beginning of June.  Because of travel it she had a VQ scan which was negative by report.  - ESR/CRP negative - Echo Cardiology report with hyperdynamic LVEF at 75%.  Cardiology has added Cardizem  1 2 mg p.o. daily based on hyperdynamic function of EF. - It is unclear exact cause of her chest pain but life-threatening conditions have been ruled out including ACS, PE.  End-stage renal disease on hemodialysis: - History of AV graft malfunction with IR fistulogram and balloon angioplasty in April 2025.  IR has been consulted for suspected AV graft malfunction--> plan for fistulogram on Monday 7/28.  She was taken down for tunneled dialysis catheter placement this morning. -Plan will be for HD per nephrology later this afternoon. -  Patient remains clinically stable without signs of volume overload  Right foot pain: - Large callus sole of the right foot - She is relatively insensate for her entire right leg secondary to prior history of necrotizing fasciitis. - That said she does have pain when directly palpating over her callus. - X-ray showed chronic advanced regular arthropathy of the midfoot, chronic linear foreign body, protuberant soft tissue swelling. - Will obtain MRI of foot.  Cannot do gadolinium due to ESRD   AV graft malfunction: -As above, IR plans to take patient back for fistulogram Monday 7/28.    Essential hypertension: - Home regimen consists of amlodipine  and hydralazine  as well as metoprolol .  All antihypertensives have been stopped on admission secondary to hypotension concerns. -Cardiology has started Cardizem  120 mg.  Will follow blood pressures, not currently on midodrine but evidently she has history of HD associated hypotension   Diabetes mellitus type 2: Per patient diabetes has resolved, will continue with every 6 hour Accu-Cheks and coverage.   Chronic diastolic congestive heart failure: Patient is euvolemic volume management through hemodialysis.    DVT prophylaxis:  heparin  injection 5,000 Units Start: 02/08/24 2200  Code Status:   Code Status: Full Code Level of care: Telemetry Cardiac Status is: Observation  Consults called: Cardiology, nephrology  Subjective: Patient awake and alert on exam today.  Chest pain still present but much reduced since admission.  Also with right foot pain when she tries to bear weight.  Objective: Vitals:   02/10/24 0359 02/10/24 0746 02/10/24  0910 02/10/24 1209  BP: 117/68 (!) 134/93 131/78 135/86  Pulse: 81 85 82 90  Resp: 18 19 12 19   Temp: 97.9 F (36.6 C) 98.2 F (36.8 C) 97.6 F (36.4 C) 98.1 F (36.7 C)  TempSrc:  Oral  Oral  SpO2: 100% 100% 100% 100%  Weight:   75 kg   Height:       No intake or output data in the 24 hours  ending 02/10/24 1431 Filed Weights   02/08/24 0758 02/10/24 0910  Weight: 76.2 kg 75 kg   Body mass index is 27.51 kg/m.  Gen: 43 y.o. female in no apparent distress.  Nontoxic Pulm: Non-labored breathing.  Clear to auscultation bilaterally.  CV: Regular rate and rhythm.  Grade 3 systolic ejection murmur noted. Chest: Nontender to palpation left sternal border today. GI: Abdomen soft, nondistended and nontender. Ext: Warm, trace bilateral ankle edema.  She does have 5 cm in diameter hypopigmented circular lesion right foot appearing to be a callus.  This is notably tender to palpation however. Skin: No rashes, lesions  Neuro: Alert and oriented. No focal neurological deficits. Psych: Calm  Judgement and insight appear normal. Mood & affect appropriate.     I have personally reviewed the following labs and images: CBC: Recent Labs  Lab 02/08/24 0802 02/09/24 0250 02/10/24 0252  WBC 6.3 6.4 5.4  NEUTROABS  --  3.0  --   HGB 10.0* 9.8* 9.7*  HCT 31.1* 31.4* 30.3*  MCV 98.1 101.6* 98.4  PLT 236 126* 211   BMP &GFR Recent Labs  Lab 02/08/24 0802 02/09/24 0250 02/10/24 0252  NA 141 139 139  K 4.0 4.3 4.8  CL 99 101 102  CO2 23 20* 20*  GLUCOSE 67* 102* 103*  BUN 56* 62* 66*  CREATININE 8.14* 9.13* 10.22*  CALCIUM  7.5* 6.9* 6.9*  MG  --  2.3  --   PHOS  --  8.9* 10.4*   Estimated Creatinine Clearance: 7.1 mL/min (A) (by C-G formula based on SCr of 10.22 mg/dL (H)). Liver & Pancreas: Recent Labs  Lab 02/08/24 1932 02/09/24 0250 02/10/24 0252  AST 29 33  --   ALT 26 26  --   ALKPHOS 60 61  --   BILITOT 0.8 1.1  --   PROT 5.7* 6.1*  --   ALBUMIN  2.8* 2.9* 2.8*   No results for input(s): LIPASE, AMYLASE in the last 168 hours. No results for input(s): AMMONIA in the last 168 hours. Diabetic: Recent Labs    02/09/24 0250  HGBA1C 4.3*   No results for input(s): GLUCAP in the last 168 hours. Cardiac Enzymes: No results for input(s): CKTOTAL,  CKMB, CKMBINDEX, TROPONINI in the last 168 hours. No results for input(s): PROBNP in the last 8760 hours. Coagulation Profile: No results for input(s): INR, PROTIME in the last 168 hours. Thyroid  Function Tests: No results for input(s): TSH, T4TOTAL, FREET4, T3FREE, THYROIDAB in the last 72 hours. Lipid Profile: Recent Labs    02/09/24 0250  CHOL 86  HDL 38*  LDLCALC 41  TRIG 33  CHOLHDL 2.3   Anemia Panel: No results for input(s): VITAMINB12, FOLATE, FERRITIN, TIBC, IRON, RETICCTPCT in the last 72 hours. Urine analysis:    Component Value Date/Time   COLORURINE YELLOW 10/13/2023 1728   APPEARANCEUR HAZY (A) 10/13/2023 1728   LABSPEC 1.014 10/13/2023 1728   PHURINE 7.0 10/13/2023 1728   GLUCOSEU 150 (A) 10/13/2023 1728   HGBUR SMALL (A) 10/13/2023 1728   BILIRUBINUR NEGATIVE  10/13/2023 1728   KETONESUR NEGATIVE 10/13/2023 1728   PROTEINUR >=300 (A) 10/13/2023 1728   UROBILINOGEN 0.2 08/21/2012 2130   NITRITE NEGATIVE 10/13/2023 1728   LEUKOCYTESUR MODERATE (A) 10/13/2023 1728   Sepsis Labs: Invalid input(s): PROCALCITONIN, LACTICIDVEN  Microbiology: No results found for this or any previous visit (from the past 240 hours).  Radiology Studies: IR Fluoro Guide CV Line Right Result Date: 02/10/2024 CLINICAL DATA:  End-stage renal disease and malfunctioning dialysis fistula. Need for temporary non tunneled dialysis catheter for hemodialysis. EXAM: NON-TUNNELED CENTRAL VENOUS HEMODIALYSIS CATHETER PLACEMENT WITH ULTRASOUND AND FLUOROSCOPIC GUIDANCE FLUOROSCOPY: Radiation Exposure Index: 1.0 mGy Kerma PROCEDURE: The procedure, risks, benefits, and alternatives were explained to the patient. Questions regarding the procedure were encouraged and answered. The patient understands and consents to the procedure. A time out was performed prior to initiating the procedure. Ultrasound was used to confirm patency of the right internal jugular vein. An  ultrasound image was saved and recorded. The right neck and chest were prepped with chlorhexidine  in a sterile fashion, and a sterile drape was applied covering the operative field. Maximum barrier sterile technique with sterile gowns and gloves were used for the procedure. Local anesthesia was provided with 1% lidocaine . After creating a small venotomy incision, a 21 gauge needle was advanced into the right internal jugular vein under direct, real-time ultrasound guidance. Ultrasound image documentation was performed. After securing guidewire access,the venotomy was dilated over a guide wire. A 16 cm, 13 Fr, triple lumen non-tunneled dialysis catheter was advanced over the wire. Final catheter positioning was confirmed and documented with a fluoroscopic spot image. The catheter was aspirated, flushed with saline, and injected with appropriate volume heparin  dwells. The catheter exit site was secured with 0-Prolene retention sutures. COMPLICATIONS: None.  No pneumothorax. FINDINGS: After catheter placement, the tip lies in the right atrium. The catheter aspirates normally and is ready for immediate use. IMPRESSION: Placement of non-tunneled central venous hemodialysis catheter via the right internal jugular vein. The catheter tip lies in the right atrium. The catheter is ready for immediate use. Electronically Signed   By: Marcey Moan M.D.   On: 02/10/2024 11:17   IR US  Guide Vasc Access Right Result Date: 02/10/2024 CLINICAL DATA:  End-stage renal disease and malfunctioning dialysis fistula. Need for temporary non tunneled dialysis catheter for hemodialysis. EXAM: NON-TUNNELED CENTRAL VENOUS HEMODIALYSIS CATHETER PLACEMENT WITH ULTRASOUND AND FLUOROSCOPIC GUIDANCE FLUOROSCOPY: Radiation Exposure Index: 1.0 mGy Kerma PROCEDURE: The procedure, risks, benefits, and alternatives were explained to the patient. Questions regarding the procedure were encouraged and answered. The patient understands and consents to  the procedure. A time out was performed prior to initiating the procedure. Ultrasound was used to confirm patency of the right internal jugular vein. An ultrasound image was saved and recorded. The right neck and chest were prepped with chlorhexidine  in a sterile fashion, and a sterile drape was applied covering the operative field. Maximum barrier sterile technique with sterile gowns and gloves were used for the procedure. Local anesthesia was provided with 1% lidocaine . After creating a small venotomy incision, a 21 gauge needle was advanced into the right internal jugular vein under direct, real-time ultrasound guidance. Ultrasound image documentation was performed. After securing guidewire access,the venotomy was dilated over a guide wire. A 16 cm, 13 Fr, triple lumen non-tunneled dialysis catheter was advanced over the wire. Final catheter positioning was confirmed and documented with a fluoroscopic spot image. The catheter was aspirated, flushed with saline, and injected with appropriate volume heparin  dwells.  The catheter exit site was secured with 0-Prolene retention sutures. COMPLICATIONS: None.  No pneumothorax. FINDINGS: After catheter placement, the tip lies in the right atrium. The catheter aspirates normally and is ready for immediate use. IMPRESSION: Placement of non-tunneled central venous hemodialysis catheter via the right internal jugular vein. The catheter tip lies in the right atrium. The catheter is ready for immediate use. Electronically Signed   By: Marcey Moan M.D.   On: 02/10/2024 11:17    Scheduled Meds:  calcitRIOL   0.5 mcg Oral Daily   Chlorhexidine  Gluconate Cloth  6 each Topical Q0600   diltiazem   120 mg Oral q morning   heparin   5,000 Units Subcutaneous Q12H   Continuous Infusions:   LOS: 1 day   35 minutes with more than 50% spent in reviewing records, counseling patient/family and coordinating care.  Jacqueline VEAR Gaw, MD [facility] [email or web address] 02/10/2024, 2:31 PM

## 2024-02-11 DIAGNOSIS — N186 End stage renal disease: Secondary | ICD-10-CM | POA: Diagnosis not present

## 2024-02-11 DIAGNOSIS — I120 Hypertensive chronic kidney disease with stage 5 chronic kidney disease or end stage renal disease: Secondary | ICD-10-CM | POA: Diagnosis not present

## 2024-02-11 LAB — CBC
HCT: 33 % — ABNORMAL LOW (ref 36.0–46.0)
Hemoglobin: 10.4 g/dL — ABNORMAL LOW (ref 12.0–15.0)
MCH: 31 pg (ref 26.0–34.0)
MCHC: 31.5 g/dL (ref 30.0–36.0)
MCV: 98.5 fL (ref 80.0–100.0)
Platelets: 190 K/uL (ref 150–400)
RBC: 3.35 MIL/uL — ABNORMAL LOW (ref 3.87–5.11)
RDW: 14.5 % (ref 11.5–15.5)
WBC: 6.2 K/uL (ref 4.0–10.5)
nRBC: 0 % (ref 0.0–0.2)

## 2024-02-11 LAB — RENAL FUNCTION PANEL
Albumin: 3.2 g/dL — ABNORMAL LOW (ref 3.5–5.0)
Anion gap: 16 — ABNORMAL HIGH (ref 5–15)
BUN: 63 mg/dL — ABNORMAL HIGH (ref 6–20)
CO2: 18 mmol/L — ABNORMAL LOW (ref 22–32)
Calcium: 7.2 mg/dL — ABNORMAL LOW (ref 8.9–10.3)
Chloride: 104 mmol/L (ref 98–111)
Creatinine, Ser: 9.62 mg/dL — ABNORMAL HIGH (ref 0.44–1.00)
GFR, Estimated: 5 mL/min — ABNORMAL LOW (ref >60)
Glucose, Bld: 122 mg/dL — ABNORMAL HIGH (ref 70–99)
Phosphorus: 8.8 mg/dL — ABNORMAL HIGH (ref 2.5–4.6)
Potassium: 5 mmol/L (ref 3.5–5.1)
Sodium: 138 mmol/L (ref 135–145)

## 2024-02-11 MED ORDER — CHLORHEXIDINE GLUCONATE CLOTH 2 % EX PADS: 6.0000 | MEDICATED_PAD | Freq: Every day | CUTANEOUS | Status: DC

## 2024-02-11 MED ORDER — CALCIUM ACETATE (PHOS BINDER) 667 MG PO CAPS
667.0000 mg | ORAL_CAPSULE | Freq: Three times a day (TID) | ORAL | Status: DC
  Administered 2024-02-11: 667 mg via ORAL
  Filled 2024-02-11 (×3): qty 1

## 2024-02-11 NOTE — Progress Notes (Addendum)
 PROGRESS NOTE  Jacqueline [name]  FMW:[id number] DOB: October 30, 1980 DOA: 02/08/2024 PCP: [name], MD  Consultants  Brief Narrative: 43 y.o. female with a PMH significant for hypertension, bronchitis diabetes chronic kidney disease, necrotizing fasciitis.  Came to emergency department with complaints of left-sided sharp chest pain worse with deep inspiration.  On day of admission she had presented to hemodialysis but they were unable to dialyze her today using her AV graft on her left arm.  Patient has history graft stenosis and clots in the past with recannulation most recently April 2025 of this year.  Patient also reports that she has had pain in the sole of her right foot that has been progressively getting worse.  She has no sensation in her right leg as she had a history of necrotizing fasciitis and now has developed a large callus-like area that is severe and deep.  Patient states that bearing weight on it is made it very difficult as is walking.  No complaints of shortness of breath or cough.    Assessment & Plan:  Chest pain: - Reason for admission.  Describes mostly as pleuritic chest pain. - Did have travel to [place] but this was almost 2 months ago back beginning of June.  Because of travel it she had a VQ scan which was negative by report.  - ESR/CRP negative - Echo Cardiology report with hyperdynamic LVEF at 75%.  Cardiology has added Cardizem  1 2 mg p.o. daily based on hyperdynamic function of EF and signed off. - It is unclear exact cause of her chest pain but life-threatening conditions have been ruled out including ACS, PE. - Regardless it is improving  End-stage renal disease on hemodialysis: - History of AV graft malfunction with IR fistulogram and balloon angioplasty in April 2025.  IR has been consulted for suspected AV graft malfunction--> plan for fistulogram on Monday 7/28.   -Plan will be for HD per nephrology afterwards - Patient remains clinically stable without  signs of volume overload - Hyperphosphatemia noted.  She is on calcitriol .  Defer any further phosphate binding to nephrology.  Right foot pain: - Large callus sole of the right foot - She is relatively insensate for her entire right leg secondary to prior history of necrotizing fasciitis. - That said she does have pain when directly palpating over her callus. - MRI shows extensive Charcot arthropathy as well as 7 cc fluid collection along plantar fascia and possible foreign body.  Partial tearing of the Lisfranc ligament.  Will discuss with orthopedics   AV graft malfunction: -As above, IR plans to take patient back for fistulogram Monday 7/28.    Essential hypertension: - Home regimen consists of amlodipine  and hydralazine  as well as metoprolol .  All antihypertensives have been stopped on admission secondary to hypotension concerns. -Cardiology has started Cardizem  120 mg.  Will follow blood pressures, not currently on midodrine but evidently she has history of HD associated hypotension   Diabetes mellitus type 2: Per patient diabetes has resolved, will continue with every 6 hour Accu-Cheks and coverage.   Chronic diastolic congestive heart failure: Patient is euvolemic volume management through hemodialysis.    DVT prophylaxis:  heparin  injection 5,000 Units Start: 02/08/24 2200  Code Status:   Code Status: Full Code Level of care: Telemetry Cardiac Status is: Observation  Consults called: Cardiology, nephrology  Subjective: No complaints today, feels well overall.  No dyspnea, LE edema.  CP better  Objective: Vitals:   02/10/24 1950 02/10/24 2100 02/11/24 0304 02/11/24  0744  BP: 118/72 138/88 118/76 127/77  Pulse: 91 91 80 88  Resp: 16  20 19   Temp: 98.5 F (36.9 C) 98.3 F (36.8 C) 97.9 F (36.6 C) 98.3 F (36.8 C)  TempSrc:  Oral  Oral  SpO2: 100% 100% 100% 100%  Weight: 78.5 kg     Height:        Intake/Output Summary (Last 24 hours) at 02/11/2024 1514 Last  data filed at 02/11/2024 1048 Gross per 24 hour  Intake 236 ml  Output 100 ml  Net 136 ml   Filed Weights   02/10/24 0910 02/10/24 1659 02/10/24 1950  Weight: 75 kg 76.8 kg 78.5 kg   Body mass index is 28.8 kg/m.  Gen: 43 y.o. female in no apparent distress.  Nontoxic Pulm: Non-labored breathing.  Clear to auscultation bilaterally.  CV: Regular rate and rhythm.  Grade 3 systolic ejection murmur noted. Chest: Nontender to palpation left sternal border today. GI: Abdomen soft, nondistended and nontender. Ext: Warm, trace bilateral ankle edema.  She does have 5 cm in diameter hypopigmented circular lesion right foot appearing to be a callus.  This is notably tender to palpation however. Skin: No rashes, lesions  Neuro: Alert and oriented. No focal neurological deficits. Psych: Calm  Judgement and insight appear normal. Mood & affect appropriate.     I have personally reviewed the following labs and images: CBC: Recent Labs  Lab 02/08/24 0802 02/09/24 0250 02/10/24 0252 02/11/24 1228  WBC 6.3 6.4 5.4 6.2  NEUTROABS  --  3.0  --   --   HGB 10.0* 9.8* 9.7* 10.4*  HCT 31.1* 31.4* 30.3* 33.0*  MCV 98.1 101.6* 98.4 98.5  PLT 236 126* 211 190   BMP &GFR Recent Labs  Lab 02/08/24 0802 02/09/24 0250 02/10/24 0252 02/11/24 1228  NA 141 139 139 138  K 4.0 4.3 4.8 5.0  CL 99 101 102 104  CO2 23 20* 20* 18*  GLUCOSE 67* 102* 103* 122*  BUN 56* 62* 66* 63*  CREATININE 8.14* 9.13* 10.22* 9.62*  CALCIUM  7.5* 6.9* 6.9* 7.2*  MG  --  2.3  --   --   PHOS  --  8.9* 10.4* 8.8*   Estimated Creatinine Clearance: 7.7 mL/min (A) (by C-G formula based on SCr of 9.62 mg/dL (H)). Liver & Pancreas: Recent Labs  Lab 02/08/24 1932 02/09/24 0250 02/10/24 0252 02/11/24 1228  AST 29 33  --   --   ALT 26 26  --   --   ALKPHOS 60 61  --   --   BILITOT 0.8 1.1  --   --   PROT 5.7* 6.1*  --   --   ALBUMIN  2.8* 2.9* 2.8* 3.2*   No results for input(s): LIPASE, AMYLASE in the last 168  hours. No results for input(s): AMMONIA in the last 168 hours. Diabetic: Recent Labs    02/09/24 0250  HGBA1C 4.3*   No results for input(s): GLUCAP in the last 168 hours. Cardiac Enzymes: No results for input(s): CKTOTAL, CKMB, CKMBINDEX, TROPONINI in the last 168 hours. No results for input(s): PROBNP in the last 8760 hours. Coagulation Profile: No results for input(s): INR, PROTIME in the last 168 hours. Thyroid  Function Tests: No results for input(s): TSH, T4TOTAL, FREET4, T3FREE, THYROIDAB in the last 72 hours. Lipid Profile: Recent Labs    02/09/24 0250  CHOL 86  HDL 38*  LDLCALC 41  TRIG 33  CHOLHDL 2.3   Anemia Panel: No results  for input(s): VITAMINB12, FOLATE, FERRITIN, TIBC, IRON, RETICCTPCT in the last 72 hours. Urine analysis:    Component Value Date/Time   COLORURINE YELLOW 10/13/2023 1728   APPEARANCEUR HAZY (A) 10/13/2023 1728   LABSPEC 1.014 10/13/2023 1728   PHURINE 7.0 10/13/2023 1728   GLUCOSEU 150 (A) 10/13/2023 1728   HGBUR SMALL (A) 10/13/2023 1728   BILIRUBINUR NEGATIVE 10/13/2023 1728   KETONESUR NEGATIVE 10/13/2023 1728   PROTEINUR >=300 (A) 10/13/2023 1728   UROBILINOGEN 0.2 08/21/2012 2130   NITRITE NEGATIVE 10/13/2023 1728   LEUKOCYTESUR MODERATE (A) 10/13/2023 1728   Sepsis Labs: Invalid input(s): PROCALCITONIN, LACTICIDVEN  Microbiology: No results found for this or any previous visit (from the past 240 hours).  Radiology Studies: MR FOOT RIGHT WO CONTRAST Result Date: 02/11/2024 CLINICAL DATA:  Soft tissue infection EXAM: MRI OF THE RIGHT FOOT WITHOUT CONTRAST TECHNIQUE: Multiplanar, multisequence MR imaging of the right foot from the heel through the Lisfranc joint was performed. No intravenous contrast was administered. COMPARISON:  Radiograph 02/08/2024 FINDINGS: Bones/Joint/Cartilage Extensive Charcot arthropathy involving the Chopart joint, midfoot, and Lisfranc joint with associated  collapse of the longitudinal arch of the foot, fragmentation and deformity of bony structures, and substantial marrow edema in some regional bony structures including the talar head and neck, collapsed/fragmented navicular, collapsed/fragmented lateral cuneiform, and middle cuneiform and lesser edema along the end ostium of adjacent structures such as the cuboid. Extensive associated spurring noted. Moderate malalignment along components of the Lisfranc joint. While osteomyelitis is difficult to completely exclude particularly in the regions of most severe marrow edema, Charcot arthropathy is favored. There may be an erosive component, for example along the lateral talar process on image 9 of series 9. Suspected lateral hindfoot impingement also on this image. Ligaments Likely partial tearing of the Lisfranc ligament on image 24 series 5. Indistinct anterior talofibular ligament likewise degenerated or chronically torn. Ill definition of components of the spring ligament. Muscles and Tendons Regional muscular atrophy. Soft tissues 3.7 by 0.8 by 4.8 cm (volume = 7 cm^3) fluid collection with moderately thick marginal wall noted along the plantar foot below the medial band of the plantar fascia and corresponding to the region of density and presumed callus on radiography and inspection. There is some linear low signal intensity within this collection on image 33 series 5 which could represent the foreign body appreciated on radiography, although measuring longer than that foreign body at 1.5 cm. Questionable tear in the underlying medial band of the plantar fascia for example on image 12 series 8. Part of this is directly below the inferiorly protruding cuboid. IMPRESSION: 1. Extensive Charcot arthropathy involving the Chopart joint, midfoot, and Lisfranc joint with associated collapse of the longitudinal arch of the foot, fragmentation and deformity of bony structures, and substantial marrow edema in some regional  bony structures. While osteomyelitis is difficult to completely exclude particularly in the regions of most severe marrow edema, Charcot arthropathy is favored. 2. 7 cc fluid collection with moderately thick marginal wall along the plantar foot below the medial band of the plantar fascia and corresponding to the region of density and presumed callus on radiography and inspection. There is some linear low signal intensity within this collection which could represent the foreign body appreciated on radiography, although measuring longer than that foreign body at 1.5 cm. Questionable tear in the underlying medial band of the plantar fascia. 3. Regional muscular atrophy. 4. Likely partial tearing of the Lisfranc ligament. Indistinct anterior talofibular ligament likewise degenerated or chronically torn.  Ill definition of components of the spring ligament. Electronically Signed   By: Ryan Salvage M.D.   On: 02/11/2024 11:11    Scheduled Meds:  calcitRIOL   0.5 mcg Oral Daily   calcium  acetate  667 mg Oral TID WC   Chlorhexidine  Gluconate Cloth  6 each Topical Q0600   diltiazem   120 mg Oral q morning   heparin   5,000 Units Subcutaneous Q12H   Continuous Infusions:   LOS: 2 days   35 minutes with more than 50% spent in reviewing records, counseling patient/family and coordinating care.  Reyes VEAR Gaw, MD [facility] [email or web address] 02/11/2024, 3:14 PM

## 2024-02-11 NOTE — Progress Notes (Signed)
 [facility] Progress Note   Subjective:   Patient seen in her room. She had a temp catheter placed yesterday. During his HD session on 02/10/24, she was having high arterial alarms. Lines were reversed and the cath was flushed, but we were unable to successfully dialyze her. Her cath was dwelled with Cath-flo with an attempt to salvage line. Plan for [facility] placement and fistula gram while she is in the hospital. Plan of care discussed with the patient and she is agreeable with the plan.    Objective Vitals:   02/10/24 1950 02/10/24 2100 02/11/24 0304 02/11/24 0744  BP: 118/72 138/88 118/76 127/77  Pulse: 91 91 80 88  Resp: 16  20 19   Temp: 98.5 F (36.9 C) 98.3 F (36.8 C) 97.9 F (36.6 C) 98.3 F (36.8 C)  TempSrc:  Oral  Oral  SpO2: 100% 100% 100% 100%  Weight: 78.5 kg     Height:       Exam Gen alert, no distress No rash, cyanosis or gangrene Sclera anicteric, throat clear  No jvd or bruits Chest clear bilat to bases, no rales/ wheezing RRR no MRG Abd soft ntnd no mass or ascites +bs GU deferred MS no joint effusions or deformity Ext no LE or UE edema, no other edema Neuro is alert, Ox 3 , nf    LUA AVG+bruit: L TDC placed 02/10/24  Additional Objective Labs: Basic Metabolic Panel: Recent Labs  Lab 02/08/24 0802 02/09/24 0250 02/10/24 0252  NA 141 139 139  K 4.0 4.3 4.8  CL 99 101 102  CO2 23 20* 20*  GLUCOSE 67* 102* 103*  BUN 56* 62* 66*  CREATININE 8.14* 9.13* 10.22*  CALCIUM  7.5* 6.9* 6.9*  PHOS  --  8.9* 10.4*   Liver Function Tests: Recent Labs  Lab 02/08/24 1932 02/09/24 0250 02/10/24 0252  AST 29 33  --   ALT 26 26  --   ALKPHOS 60 61  --   BILITOT 0.8 1.1  --   PROT 5.7* 6.1*  --   ALBUMIN  2.8* 2.9* 2.8*   No results for input(s): LIPASE, AMYLASE in the last 168 hours. CBC: Recent Labs  Lab 02/08/24 0802 02/09/24 0250 02/10/24 0252 02/11/24 1228  WBC 6.3 6.4 5.4 6.2  NEUTROABS  --  3.0  --   --   HGB 10.0* 9.8*  9.7* 10.4*  HCT 31.1* 31.4* 30.3* 33.0*  MCV 98.1 101.6* 98.4 98.5  PLT 236 126* 211 190    Medications:   calcitRIOL   0.5 mcg Oral Daily   Chlorhexidine  Gluconate Cloth  6 each Topical Q0600   diltiazem   120 mg Oral q morning   heparin   5,000 Units Subcutaneous Q12H    Home bp meds: Lasix  80 every day Metoprolol  25 bid Midodrine 5mg  tid    OP HD: [facility]   B500   73kg  3K bath  AVG  Heparin  3000 Gets to dry wt   Assessment/ Plan: AV graft malfunction: Unable to cannulate patient in HD. IR made aware and she had a temp catheter placed 02/10/24. Temp cath not working. Plan for [facility] placement this week and updated graft work.  ESRD: on HD [facility], last HD 7/22 at outpatient unit.  See above about access BP: takes midodrine and metoprolol  at home. BP' 90s- 100s here, appear she has chronically low blood pressures.  Volume: no vol excess on exam. Standing wt to 73kg.  Anemia of esrd: Hb 9-11 here, follow. Hgb 10.4  today. Pleuritic chest pain: patient taken to ED due to reported chest pain. VQ showed no suspicious perfusion abnormality or filling defect and normal distribution of radiotracer activity throughout both lungs. Resolved.  Right foot pain: patient with large callus on the bottom of her right foot. Hx of necrotizing fasciitis. Xray showed chronic advanced regular arthropathy of the midfoot, chronic linear foreign body, and protuberant soft tissue swelling. MRI pending.  BMD: Phos 10.4, Corrected Ca 7.9. Start Ca Acetate 667 TID with meals. Will probably need higher dose. CTM   Belvie Och, NP 02/11/2024, 12:51 PM  [facility]

## 2024-02-11 NOTE — Plan of Care (Signed)
  Problem: Education: Goal: Knowledge of General Education information will improve Description: Including pain rating scale, medication(s)/side effects and non-pharmacologic comfort measures Outcome: Progressing   Problem: Health Behavior/Discharge Planning: Goal: Ability to manage health-related needs will improve Outcome: Progressing   Problem: Clinical Measurements: Goal: Ability to maintain clinical measurements within normal limits will improve Outcome: Progressing   Problem: Clinical Measurements: Goal: Diagnostic test results will improve Outcome: Progressing   Problem: Activity: Goal: Risk for activity intolerance will decrease Outcome: Progressing   Problem: Pain Managment: Goal: General experience of comfort will improve and/or be controlled Outcome: Progressing

## 2024-02-12 DIAGNOSIS — I120 Hypertensive chronic kidney disease with stage 5 chronic kidney disease or end stage renal disease: Secondary | ICD-10-CM | POA: Diagnosis not present

## 2024-02-12 DIAGNOSIS — T82590A Other mechanical complication of surgically created arteriovenous fistula, initial encounter: Secondary | ICD-10-CM | POA: Diagnosis not present

## 2024-02-12 DIAGNOSIS — Z992 Dependence on renal dialysis: Secondary | ICD-10-CM | POA: Diagnosis not present

## 2024-02-12 DIAGNOSIS — N186 End stage renal disease: Secondary | ICD-10-CM | POA: Diagnosis not present

## 2024-02-12 DIAGNOSIS — D631 Anemia in chronic kidney disease: Secondary | ICD-10-CM | POA: Diagnosis not present

## 2024-02-12 DIAGNOSIS — R079 Chest pain, unspecified: Secondary | ICD-10-CM | POA: Diagnosis not present

## 2024-02-12 LAB — RENAL FUNCTION PANEL
Albumin: 2.9 g/dL — ABNORMAL LOW (ref 3.5–5.0)
Anion gap: 14 (ref 5–15)
BUN: 67 mg/dL — ABNORMAL HIGH (ref 6–20)
CO2: 20 mmol/L — ABNORMAL LOW (ref 22–32)
Calcium: 7.2 mg/dL — ABNORMAL LOW (ref 8.9–10.3)
Chloride: 105 mmol/L (ref 98–111)
Creatinine, Ser: 10.05 mg/dL — ABNORMAL HIGH (ref 0.44–1.00)
GFR, Estimated: 4 mL/min — ABNORMAL LOW (ref >60)
Glucose, Bld: 88 mg/dL (ref 70–99)
Phosphorus: 9.5 mg/dL — ABNORMAL HIGH (ref 2.5–4.6)
Potassium: 5.6 mmol/L — ABNORMAL HIGH (ref 3.5–5.1)
Sodium: 139 mmol/L (ref 135–145)

## 2024-02-12 MED ORDER — SODIUM ZIRCONIUM CYCLOSILICATE 10 G PO PACK
10.0000 g | PACK | Freq: Three times a day (TID) | ORAL | Status: AC
  Administered 2024-02-12: 10 g via ORAL
  Filled 2024-02-12: qty 1

## 2024-02-12 MED ORDER — CALCIUM ACETATE (PHOS BINDER) 667 MG PO CAPS
1334.0000 mg | ORAL_CAPSULE | Freq: Three times a day (TID) | ORAL | Status: DC
  Administered 2024-02-12 – 2024-02-14 (×6): 1334 mg via ORAL
  Filled 2024-02-12 (×7): qty 2

## 2024-02-12 MED ORDER — SENNA 8.6 MG PO TABS: 1.0000 | ORAL_TABLET | Freq: Every day | ORAL | Status: DC | PRN

## 2024-02-12 MED ORDER — DIPHENHYDRAMINE HCL 25 MG PO CAPS
25.0000 mg | ORAL_CAPSULE | Freq: Four times a day (QID) | ORAL | Status: DC | PRN
  Administered 2024-02-12 – 2024-02-13 (×2): 25 mg via ORAL
  Filled 2024-02-12 (×2): qty 1

## 2024-02-12 NOTE — Plan of Care (Signed)
  Problem: Education: Goal: Knowledge of General Education information will improve Description: Including pain rating scale, medication(s)/side effects and non-pharmacologic comfort measures Outcome: Progressing   Problem: Activity: Goal: Risk for activity intolerance will decrease Outcome: Progressing   Problem: Nutrition: Goal: Adequate nutrition will be maintained Outcome: Progressing   Problem: Elimination: Goal: Will not experience complications related to bowel motility Outcome: Progressing Goal: Will not experience complications related to urinary retention Outcome: Progressing   Problem: Safety: Goal: Ability to remain free from injury will improve Outcome: Progressing   Problem: Pain Managment: Goal: General experience of comfort will improve and/or be controlled Outcome: Progressing   Problem: Skin Integrity: Goal: Risk for impaired skin integrity will decrease Outcome: Progressing

## 2024-02-12 NOTE — Progress Notes (Signed)
 PROGRESS NOTE  Jacqueline [name]  FMW:[id number] DOB: Feb 20, 1981 DOA: 02/08/2024 PCP: [name], MD  Consultants  Brief Narrative: 43 y.o. female with a PMH significant for hypertension, bronchitis diabetes chronic kidney disease, necrotizing fasciitis.  Came to emergency department with complaints of left-sided sharp chest pain worse with deep inspiration.  On day of admission she had presented to hemodialysis but they were unable to dialyze her today using her AV graft on her left arm.  Patient has history graft stenosis and clots in the past with recannulation most recently April 2025 of this year.  Patient also reports that she has had pain in the sole of her right foot that has been progressively getting worse.  She has no sensation in her right leg as she had a history of necrotizing fasciitis and now has developed a large callus-like area that is severe and deep.  Patient states that bearing weight on it is made it very difficult as is walking.  No complaints of shortness of breath or cough.    Assessment & Plan: Chest pain: - Reason for admission.  Describes mostly as pleuritic chest pain. - It is unclear exact cause of her chest pain but life-threatening conditions have been ruled out including ACS, PE. - Regardless it has resolved  End-stage renal disease on hemodialysis: - History of AV graft malfunction with IR fistulogram and balloon angioplasty in April 2025.  IR has been consulted for suspected AV graft malfunction--> plan for fistulogram on Tuesday 7/29   -Plan will be for HD per nephrology afterwards - Patient remains clinically stable without signs of volume overload - Nephro has added sevalamer  Hyperkalemia: - noted today.  Mild - started on Lokelma  as we continue to await dialysis.  Right foot pain: - Large callus sole of the right foot - She is relatively insensate for her entire right leg secondary to prior history of necrotizing fasciitis. - That said she does  have pain when directly palpating over her callus. - MRI shows extensive Charcot arthropathy as well as 7 cc fluid collection along plantar fascia and possible foreign body.  Partial tearing of the Lisfranc ligament.     AV graft malfunction: -As above, IR plans to take patient back for fistulogram Tuesday 7/29    Essential hypertension: - Home regimen consists of amlodipine  and hydralazine  as well as metoprolol .  All antihypertensives have been stopped on admission secondary to hypotension concerns. -Cardiology has started Cardizem  120 mg.  Will follow blood pressures, not currently on midodrine but evidently she has history of HD associated hypotension   Diabetes mellitus type 2: Per patient diabetes has resolved, will continue with every 6 hour Accu-Cheks and coverage.   Chronic diastolic congestive heart failure: Patient is euvolemic volume management through hemodialysis.    DVT prophylaxis:  heparin  injection 5,000 Units Start: 02/08/24 2200  Code Status:   Code Status: Full Code Level of care: Telemetry Cardiac Status is: Observation  Consults called: Cardiology, nephrology  Subjective: No complaints today, feels well overall.  No dyspnea, LE edema.  CP better.  Hungry  Objective: Vitals:   02/11/24 2332 02/12/24 0312 02/12/24 0807 02/12/24 1218  BP: (!) 163/92 116/68 134/84 107/64  Pulse: 98 84 95 86  Resp: 20 20 18 18   Temp: 98.8 F (37.1 C) 98.5 F (36.9 C) 98.3 F (36.8 C) 98.7 F (37.1 C)  TempSrc: Oral Oral    SpO2: 100% 100% 100% 99%  Weight:      Height:  Intake/Output Summary (Last 24 hours) at 02/12/2024 1520 Last data filed at 02/11/2024 2017 Gross per 24 hour  Intake 240 ml  Output --  Net 240 ml   Filed Weights   02/10/24 0910 02/10/24 1659 02/10/24 1950  Weight: 75 kg 76.8 kg 78.5 kg   Body mass index is 28.8 kg/m.  Gen: 43 y.o. female in no apparent distress.  Nontoxic Pulm: Non-labored breathing.  Clear to auscultation  bilaterally.  CV: Regular rate and rhythm.  Grade 3 systolic ejection murmur noted. Chest: Nontender to palpation left sternal border today. GI: Abdomen soft, nondistended and nontender. Ext: Warm, trace bilateral ankle edema.  She does have 5 cm in diameter hypopigmented circular lesion right foot appearing to be a callus.  This is notably tender to palpation however. Skin: No rashes, lesions  Neuro: Alert and oriented. No focal neurological deficits. Psych: Calm  Judgement and insight appear normal. Mood & affect appropriate.     I have personally reviewed the following labs and images: CBC: Recent Labs  Lab 02/08/24 0802 02/09/24 0250 02/10/24 0252 02/11/24 1228  WBC 6.3 6.4 5.4 6.2  NEUTROABS  --  3.0  --   --   HGB 10.0* 9.8* 9.7* 10.4*  HCT 31.1* 31.4* 30.3* 33.0*  MCV 98.1 101.6* 98.4 98.5  PLT 236 126* 211 190   BMP &GFR Recent Labs  Lab 02/08/24 0802 02/09/24 0250 02/10/24 0252 02/11/24 1228 02/12/24 0433  NA 141 139 139 138 139  K 4.0 4.3 4.8 5.0 5.6*  CL 99 101 102 104 105  CO2 23 20* 20* 18* 20*  GLUCOSE 67* 102* 103* 122* 88  BUN 56* 62* 66* 63* 67*  CREATININE 8.14* 9.13* 10.22* 9.62* 10.05*  CALCIUM  7.5* 6.9* 6.9* 7.2* 7.2*  MG  --  2.3  --   --   --   PHOS  --  8.9* 10.4* 8.8* 9.5*   Estimated Creatinine Clearance: 7.4 mL/min (A) (by C-G formula based on SCr of 10.05 mg/dL (H)). Liver & Pancreas: Recent Labs  Lab 02/08/24 1932 02/09/24 0250 02/10/24 0252 02/11/24 1228 02/12/24 0433  AST 29 33  --   --   --   ALT 26 26  --   --   --   ALKPHOS 60 61  --   --   --   BILITOT 0.8 1.1  --   --   --   PROT 5.7* 6.1*  --   --   --   ALBUMIN  2.8* 2.9* 2.8* 3.2* 2.9*   No results for input(s): LIPASE, AMYLASE in the last 168 hours. No results for input(s): AMMONIA in the last 168 hours. Diabetic: No results for input(s): HGBA1C in the last 72 hours.  No results for input(s): GLUCAP in the last 168 hours. Cardiac Enzymes: No results  for input(s): CKTOTAL, CKMB, CKMBINDEX, TROPONINI in the last 168 hours. No results for input(s): PROBNP in the last 8760 hours. Coagulation Profile: No results for input(s): INR, PROTIME in the last 168 hours. Thyroid  Function Tests: No results for input(s): TSH, T4TOTAL, FREET4, T3FREE, THYROIDAB in the last 72 hours. Lipid Profile: No results for input(s): CHOL, HDL, LDLCALC, TRIG, CHOLHDL, LDLDIRECT in the last 72 hours.  Anemia Panel: No results for input(s): VITAMINB12, FOLATE, FERRITIN, TIBC, IRON, RETICCTPCT in the last 72 hours. Urine analysis:    Component Value Date/Time   COLORURINE YELLOW 10/13/2023 1728   APPEARANCEUR HAZY (A) 10/13/2023 1728   LABSPEC 1.014 10/13/2023 1728   PHURINE  7.0 10/13/2023 1728   GLUCOSEU 150 (A) 10/13/2023 1728   HGBUR SMALL (A) 10/13/2023 1728   BILIRUBINUR NEGATIVE 10/13/2023 1728   KETONESUR NEGATIVE 10/13/2023 1728   PROTEINUR >=300 (A) 10/13/2023 1728   UROBILINOGEN 0.2 08/21/2012 2130   NITRITE NEGATIVE 10/13/2023 1728   LEUKOCYTESUR MODERATE (A) 10/13/2023 1728   Sepsis Labs: Invalid input(s): PROCALCITONIN, LACTICIDVEN  Microbiology: No results found for this or any previous visit (from the past 240 hours).  Radiology Studies: No results found.   Scheduled Meds:  calcitRIOL   0.5 mcg Oral Daily   calcium  acetate  1,334 mg Oral TID WC   Chlorhexidine  Gluconate Cloth  6 each Topical Q0600   diltiazem   120 mg Oral q morning   heparin   5,000 Units Subcutaneous Q12H   Continuous Infusions:   LOS: 3 days   35 minutes with more than 50% spent in reviewing records, counseling patient/family and coordinating care.  Jacqueline VEAR Gaw, MD [facility] [email or web address] 02/12/2024, 3:20 PM

## 2024-02-12 NOTE — Progress Notes (Addendum)
 [facility] Progress Note   Subjective:   Patient feels well with no complaints.  Planning for procedures today  Objective Vitals:   02/11/24 2017 02/11/24 2332 02/12/24 0312 02/12/24 0807  BP: 104/65 (!) 163/92 116/68 134/84  Pulse: 84 98 84 95  Resp: 20 20 20 18   Temp: 98.4 F (36.9 C) 98.8 F (37.1 C) 98.5 F (36.9 C) 98.3 F (36.8 C)  TempSrc: Oral Oral Oral   SpO2: 99% 100% 100% 100%  Weight:      Height:       Exam Gen alert, no distress No rash, cyanosis or gangrene Sclera anicteric, throat clear  No jvd or bruits Clear to auscultation bilaterally, no increased work of breathing Normal rate, no rub Abd soft ntnd no mass or ascites +bs GU deferred MS no joint effusions or deformity Ext no LE or UE edema, no other edema Neuro is alert, Ox 3 , nf    LUA AVG+bruit but weak: L.  Temporary catheter placed 02/10/24  Additional Objective Labs: Basic Metabolic Panel: Recent Labs  Lab 02/10/24 0252 02/11/24 1228 02/12/24 0433  NA 139 138 139  K 4.8 5.0 5.6*  CL 102 104 105  CO2 20* 18* 20*  GLUCOSE 103* 122* 88  BUN 66* 63* 67*  CREATININE 10.22* 9.62* 10.05*  CALCIUM  6.9* 7.2* 7.2*  PHOS 10.4* 8.8* 9.5*   Liver Function Tests: Recent Labs  Lab 02/08/24 1932 02/09/24 0250 02/10/24 0252 02/11/24 1228 02/12/24 0433  AST 29 33  --   --   --   ALT 26 26  --   --   --   ALKPHOS 60 61  --   --   --   BILITOT 0.8 1.1  --   --   --   PROT 5.7* 6.1*  --   --   --   ALBUMIN  2.8* 2.9* 2.8* 3.2* 2.9*   No results for input(s): LIPASE, AMYLASE in the last 168 hours. CBC: Recent Labs  Lab 02/08/24 0802 02/09/24 0250 02/10/24 0252 02/11/24 1228  WBC 6.3 6.4 5.4 6.2  NEUTROABS  --  3.0  --   --   HGB 10.0* 9.8* 9.7* 10.4*  HCT 31.1* 31.4* 30.3* 33.0*  MCV 98.1 101.6* 98.4 98.5  PLT 236 126* 211 190    Medications:   calcitRIOL   0.5 mcg Oral Daily   calcium  acetate  667 mg Oral TID WC   Chlorhexidine  Gluconate Cloth  6 each  Topical Q0600   diltiazem   120 mg Oral q morning   heparin   5,000 Units Subcutaneous Q12H   sodium zirconium cyclosilicate   10 g Oral TID    Home bp meds: Lasix  80 every day Metoprolol  25 bid Midodrine 5mg  tid    OP HD: [facility]   B500   73kg  3K bath  AVG  Heparin  3000 Gets to dry wt   Assessment/ Plan: AV graft malfunction: Unable to cannulate patient in HD. IR made aware and she had a temp catheter placed 02/10/24. Temp cath not working. Plan for [facility] placement with fistulogram likely requiring declot. ESRD: on HD TTS, last HD 7/22 at outpatient unit.  Plan for dialysis today after access is obtained BP: takes midodrine and metoprolol  at home. BP' 90s- 100s here, chronic issue Volume: no vol excess on exam. Standing wt to 73kg.  Anemia of esrd: Hb 9-11 here, follow.  Pleuritic chest pain: patient taken to ED due to reported chest pain. VQ showed no  suspicious perfusion abnormality or filling defect and normal distribution of radiotracer activity throughout both lungs. Resolved.  Right foot pain: Charcot arthropathy possible osteomyelitis.  Management per primary team BMD: Phosphorus at 9.5.  Corrected calcium  around 8.  Obtain PTH.  Increase binders.  Continue home calcitriol  daily    02/12/2024, 10:00 AM  [employer]

## 2024-02-12 NOTE — Progress Notes (Signed)
 Pt receives OP HD, TTS at [facility] clinic. Chair time is 0540. Will assist as needed.   Isadore Bokhari Dialysis Navigator [email or web address]

## 2024-02-12 NOTE — Plan of Care (Signed)

## 2024-02-13 ENCOUNTER — Inpatient Hospital Stay (HOSPITAL_COMMUNITY)

## 2024-02-13 DIAGNOSIS — N186 End stage renal disease: Secondary | ICD-10-CM | POA: Diagnosis not present

## 2024-02-13 DIAGNOSIS — T82511A Breakdown (mechanical) of surgically created arteriovenous shunt, initial encounter: Secondary | ICD-10-CM | POA: Diagnosis not present

## 2024-02-13 DIAGNOSIS — E1140 Type 2 diabetes mellitus with diabetic neuropathy, unspecified: Secondary | ICD-10-CM | POA: Diagnosis not present

## 2024-02-13 DIAGNOSIS — D631 Anemia in chronic kidney disease: Secondary | ICD-10-CM | POA: Diagnosis not present

## 2024-02-13 DIAGNOSIS — I132 Hypertensive heart and chronic kidney disease with heart failure and with stage 5 chronic kidney disease, or end stage renal disease: Secondary | ICD-10-CM | POA: Diagnosis not present

## 2024-02-13 DIAGNOSIS — T82858A Stenosis of vascular prosthetic devices, implants and grafts, initial encounter: Secondary | ICD-10-CM | POA: Diagnosis not present

## 2024-02-13 DIAGNOSIS — E1122 Type 2 diabetes mellitus with diabetic chronic kidney disease: Secondary | ICD-10-CM | POA: Diagnosis not present

## 2024-02-13 DIAGNOSIS — Z992 Dependence on renal dialysis: Secondary | ICD-10-CM | POA: Diagnosis not present

## 2024-02-13 DIAGNOSIS — I5032 Chronic diastolic (congestive) heart failure: Secondary | ICD-10-CM | POA: Diagnosis not present

## 2024-02-13 DIAGNOSIS — R079 Chest pain, unspecified: Secondary | ICD-10-CM | POA: Diagnosis not present

## 2024-02-13 DIAGNOSIS — I120 Hypertensive chronic kidney disease with stage 5 chronic kidney disease or end stage renal disease: Secondary | ICD-10-CM | POA: Diagnosis not present

## 2024-02-13 DIAGNOSIS — T82590A Other mechanical complication of surgically created arteriovenous fistula, initial encounter: Secondary | ICD-10-CM | POA: Diagnosis not present

## 2024-02-13 DIAGNOSIS — J42 Unspecified chronic bronchitis: Secondary | ICD-10-CM | POA: Diagnosis not present

## 2024-02-13 DIAGNOSIS — E11610 Type 2 diabetes mellitus with diabetic neuropathic arthropathy: Secondary | ICD-10-CM | POA: Diagnosis not present

## 2024-02-13 HISTORY — PX: IR US GUIDE VASC ACCESS LEFT: IMG2389

## 2024-02-13 HISTORY — PX: IR AV DIALY SHUNT INTRO NEEDLE/INTRACATH INITIAL W/PTA/IMG LEFT: IMG6103

## 2024-02-13 LAB — BASIC METABOLIC PANEL WITH GFR
Anion gap: 15 (ref 5–15)
BUN: 71 mg/dL — ABNORMAL HIGH (ref 6–20)
CO2: 19 mmol/L — ABNORMAL LOW (ref 22–32)
Calcium: 7.3 mg/dL — ABNORMAL LOW (ref 8.9–10.3)
Chloride: 106 mmol/L (ref 98–111)
Creatinine, Ser: 10.1 mg/dL — ABNORMAL HIGH (ref 0.44–1.00)
GFR, Estimated: 4 mL/min — ABNORMAL LOW (ref >60)
Glucose, Bld: 119 mg/dL — ABNORMAL HIGH (ref 70–99)
Potassium: 5.2 mmol/L — ABNORMAL HIGH (ref 3.5–5.1)
Sodium: 140 mmol/L (ref 135–145)

## 2024-02-13 LAB — CBC
HCT: 26.4 % — ABNORMAL LOW (ref 36.0–46.0)
Hemoglobin: 8.6 g/dL — ABNORMAL LOW (ref 12.0–15.0)
MCH: 31.5 pg (ref 26.0–34.0)
MCHC: 32.6 g/dL (ref 30.0–36.0)
MCV: 96.7 fL (ref 80.0–100.0)
Platelets: 160 K/uL (ref 150–400)
RBC: 2.73 MIL/uL — ABNORMAL LOW (ref 3.87–5.11)
RDW: 14.1 % (ref 11.5–15.5)
WBC: 6.1 K/uL (ref 4.0–10.5)
nRBC: 0 % (ref 0.0–0.2)

## 2024-02-13 IMAGING — US US BREAST*R* LIMITED INC AXILLA
1 series · 7 of 7 positions shown · non-contrast
Comparison: Baseline mammogram 10/29/2021.

CLINICAL DATA: Recall from baseline screening mammography, focal
asymmetry in the outer RIGHT breast at middle to posterior depth.
Personal history of BILATERAL reduction mammoplasty.

EXAM:
DIGITAL DIAGNOSTIC UNILATERAL RIGHT MAMMOGRAM WITH TOMOSYNTHESIS AND
CAD; ULTRASOUND RIGHT BREAST LIMITED
TECHNIQUE: Right digital diagnostic mammography and breast tomosynthesis was
performed. The images were evaluated with computer-aided detection.;
Targeted ultrasound examination of the right breast was performed

[Series 1: us breast*right* limited inc axilla · 0.06mm/px · 7 of 7 slices shown]
[im 1/7]
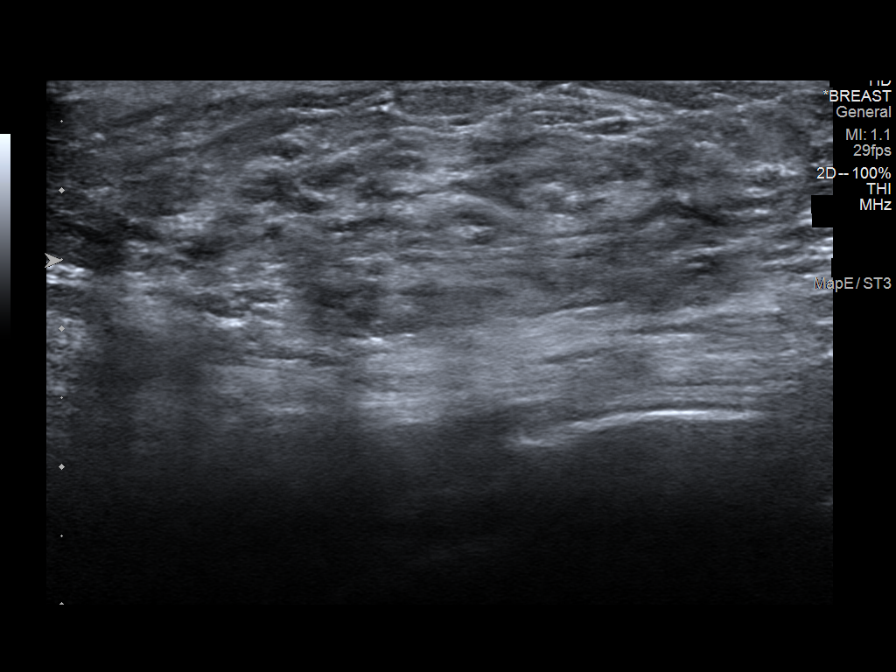
[im 2/7]
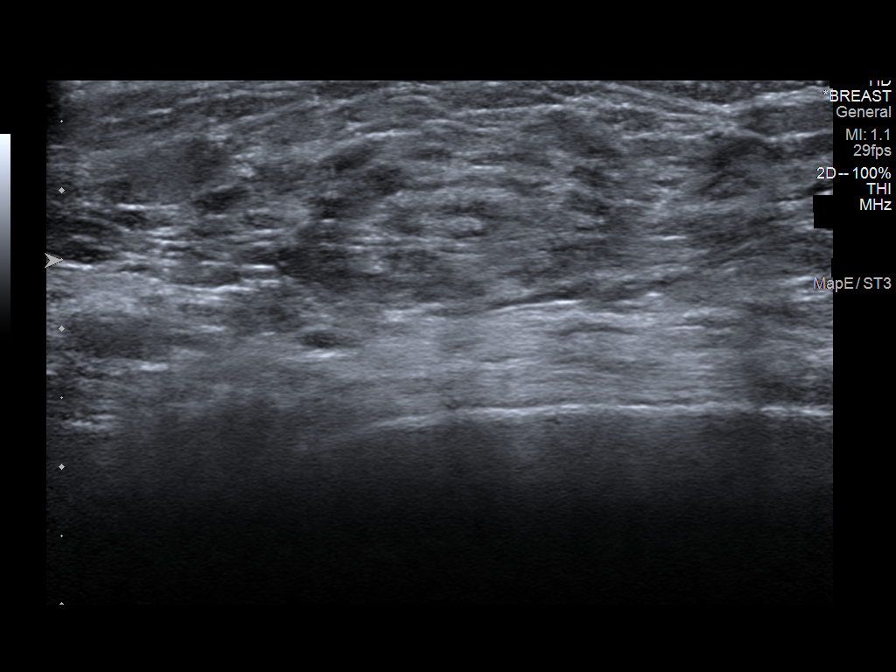
[im 3/7]
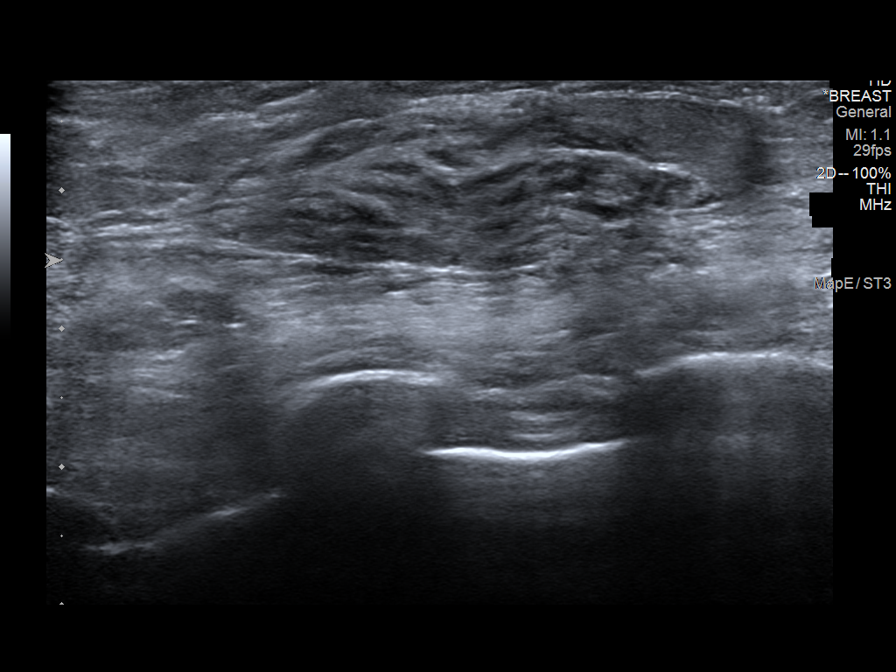
[im 4/7]
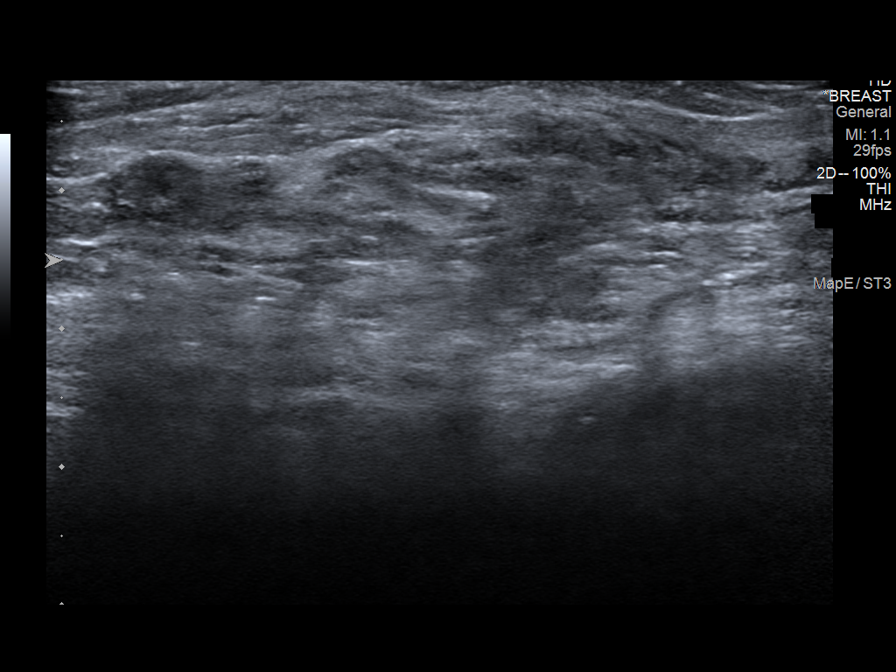
[im 5/7]
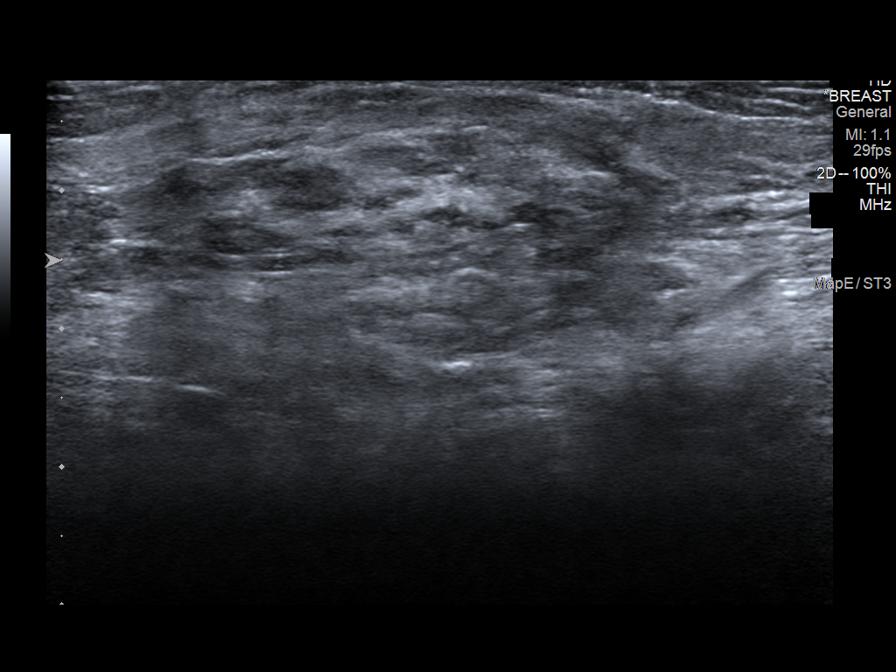
[im 6/7]
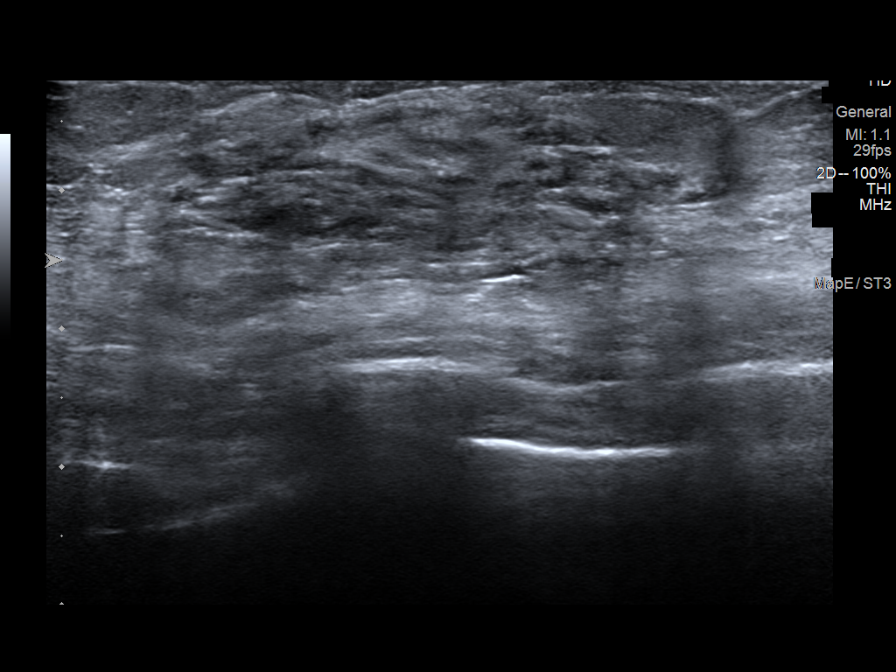
[im 7/7]
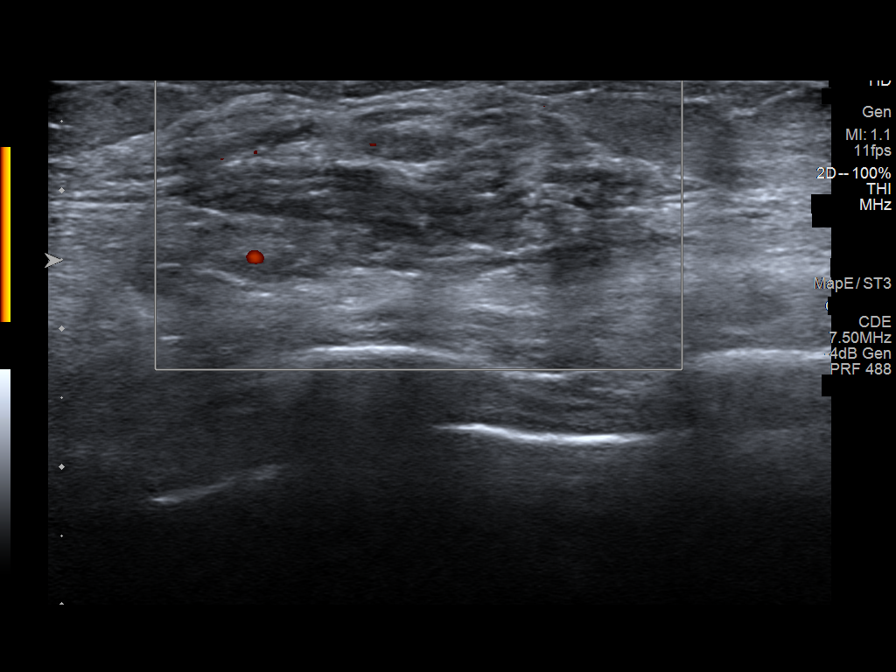

[7 of 7 positions shown; findings below may reference images not displayed]

ACR Breast Density Category b: There are scattered areas of
fibroglandular density.
FINDINGS: Spot-compression CC and MLO views of the area of concern were
obtained.

The focal asymmetry in the outer breast at middle to posterior depth
persists but disperses with compression, likely indicating an
isolated island of fibroglandular tissue (or possibly a hamartoma,
though a discrete capsule is not identified to confirm a hamartoma).
There is no associated architectural distortion or suspicious
calcifications.

Targeted ultrasound is performed in the outer breast, demonstrating
focally dense fibroglandular tissue at the 9 o'clock position 13 cm
from the nipple. No capsule is identified to suggest that this
represents a hamartoma. No cyst, solid mass or abnormal acoustic
shadowing is identified.

On correlative physical examination, the focally dense
fibroglandular tissue causes mild palpable thickening in the outer
breast, but the tissue is mobile, and there is no palpable mass.
IMPRESSION: 1. No mammographic or sonographic evidence of malignancy involving
the RIGHT breast.
2. Island of focally dense asymmetric fibroglandular tissue in the
outer RIGHT breast which accounts for the baseline screening
mammographic finding.

RECOMMENDATION:
Screening mammogram in one year.(Code:PD-Z-8QA)

I have discussed the findings and recommendations with the patient.
If applicable, a reminder letter will be sent to the patient
regarding the next appointment.

BI-RADS CATEGORY  2: Benign.

## 2024-02-13 IMAGING — MG MM DIGITAL DIAGNOSTIC UNILAT*R* W/ TOMO W/ CAD
4 series · 4 of 12 positions shown · non-contrast
Comparison: Baseline mammogram 10/29/2021.

CLINICAL DATA: Recall from baseline screening mammography, focal
asymmetry in the outer RIGHT breast at middle to posterior depth.
Personal history of BILATERAL reduction mammoplasty.

EXAM:
DIGITAL DIAGNOSTIC UNILATERAL RIGHT MAMMOGRAM WITH TOMOSYNTHESIS AND
CAD; ULTRASOUND RIGHT BREAST LIMITED
TECHNIQUE: Right digital diagnostic mammography and breast tomosynthesis was
performed. The images were evaluated with computer-aided detection.;
Targeted ultrasound examination of the right breast was performed

[R CC synth-2D]
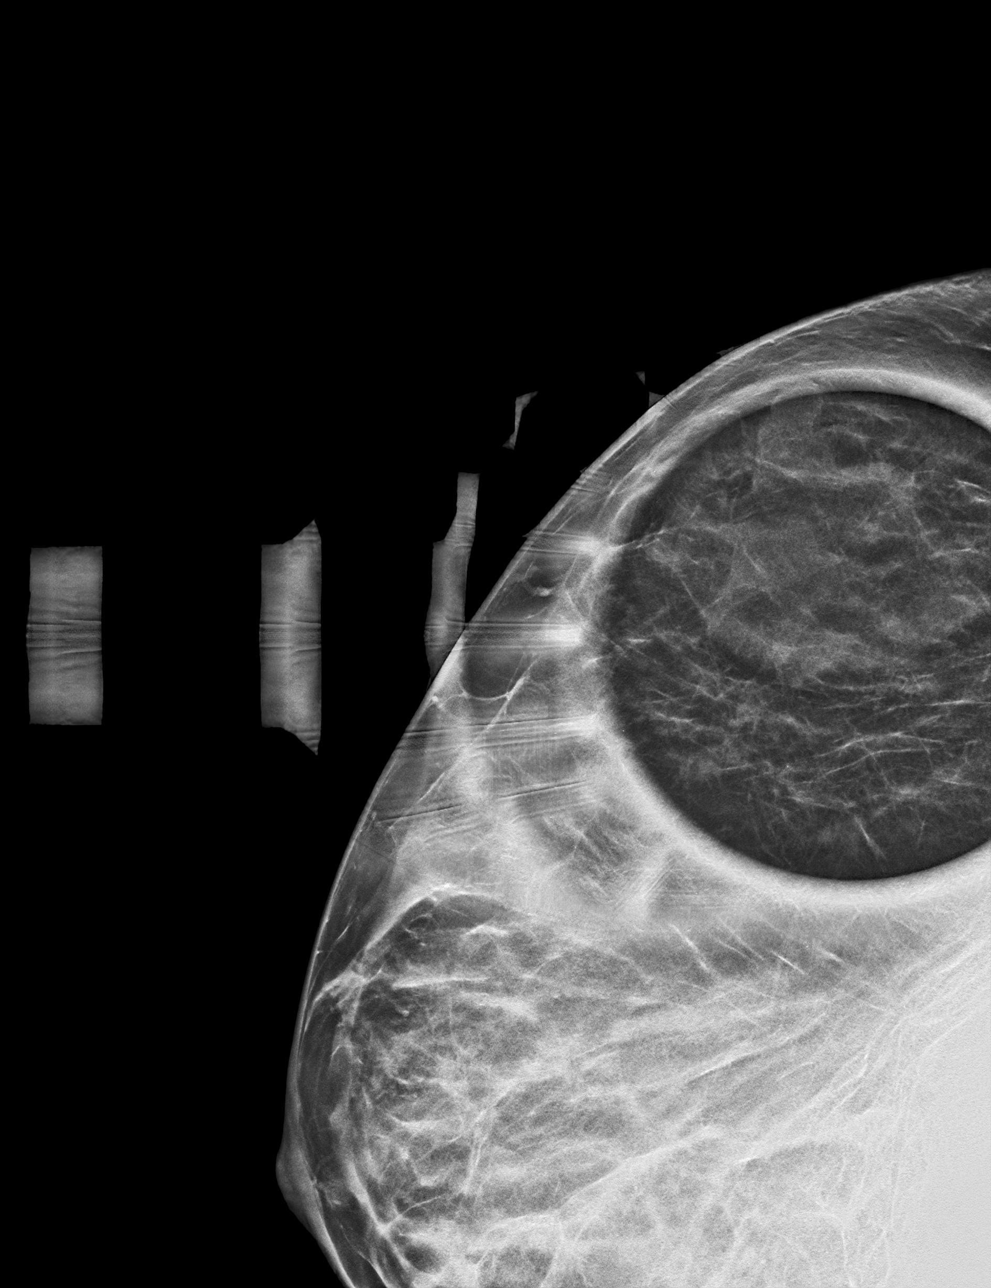

[R MLO synth-2D]
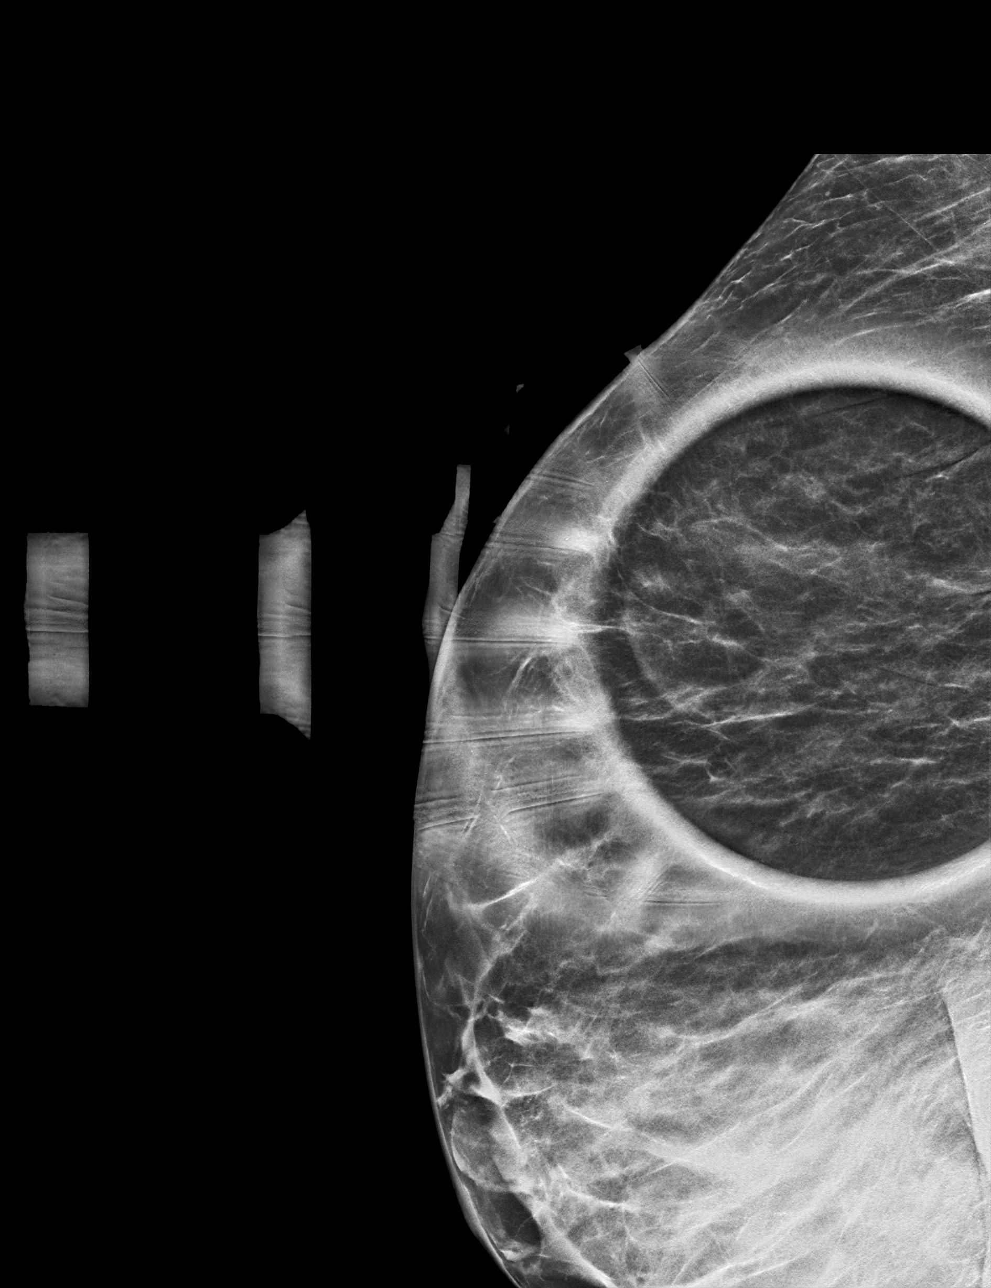

[R MLO tomo · tomo slice 23/45.0]
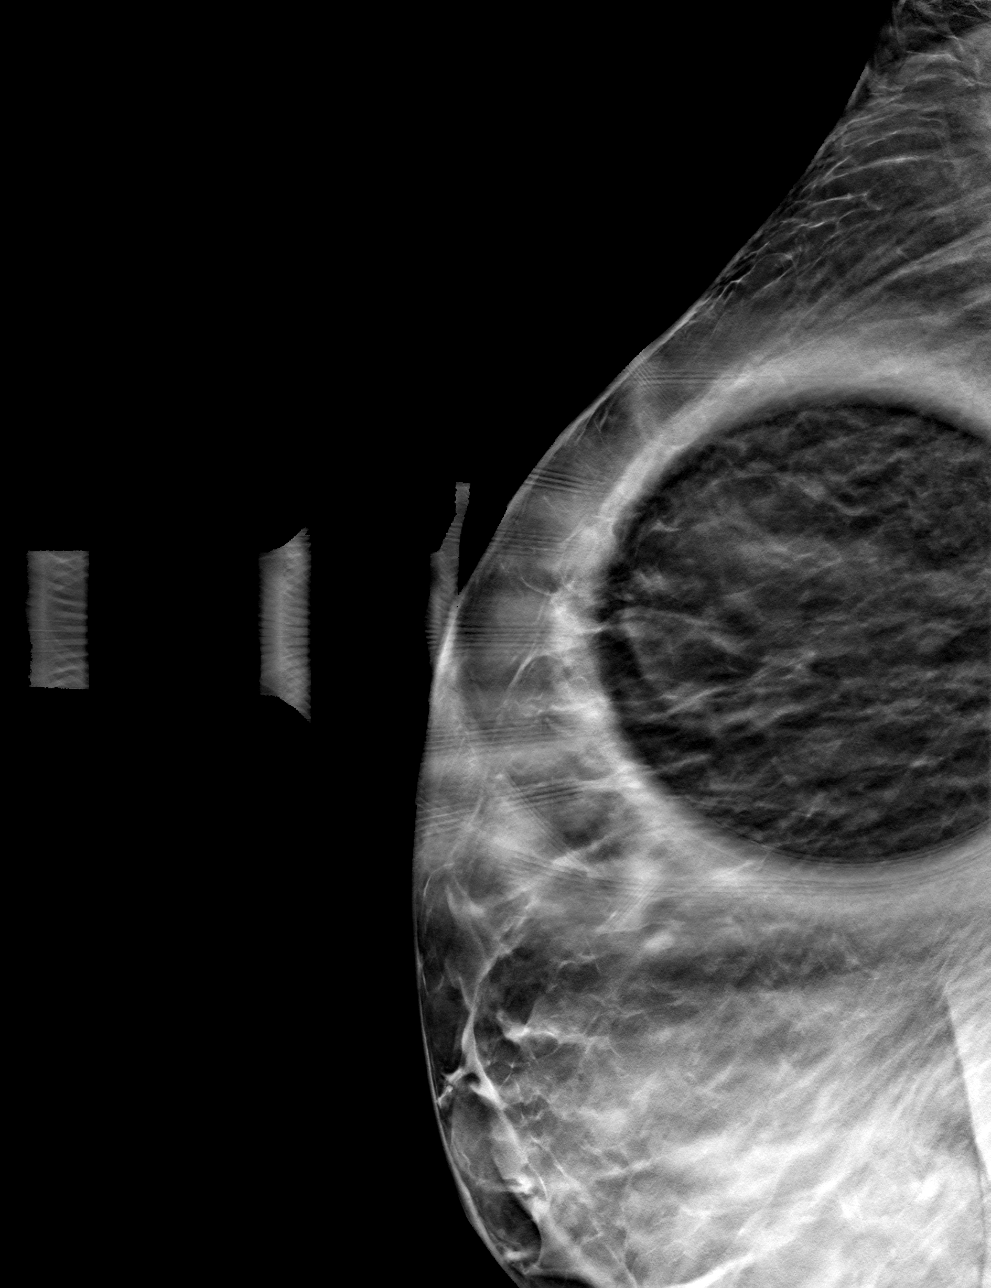

[R CC tomo · tomo slice 21/40.0]
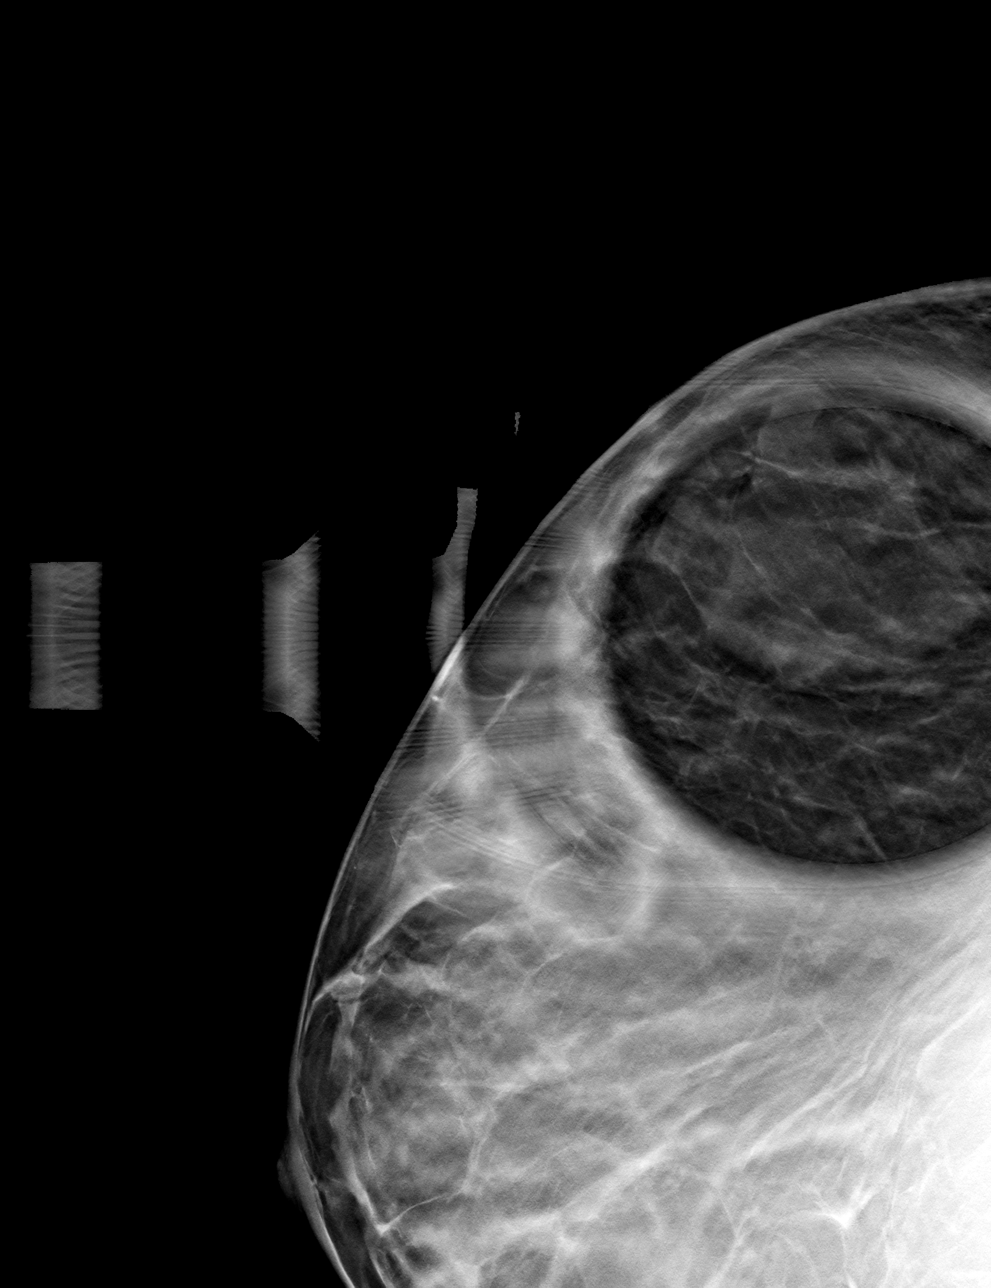

[4 of 12 positions shown; findings below may reference images not displayed]

ACR Breast Density Category b: There are scattered areas of
fibroglandular density.
FINDINGS: Spot-compression CC and MLO views of the area of concern were
obtained.

The focal asymmetry in the outer breast at middle to posterior depth
persists but disperses with compression, likely indicating an
isolated island of fibroglandular tissue (or possibly a hamartoma,
though a discrete capsule is not identified to confirm a hamartoma).
There is no associated architectural distortion or suspicious
calcifications.

Targeted ultrasound is performed in the outer breast, demonstrating
focally dense fibroglandular tissue at the 9 o'clock position 13 cm
from the nipple. No capsule is identified to suggest that this
represents a hamartoma. No cyst, solid mass or abnormal acoustic
shadowing is identified.

On correlative physical examination, the focally dense
fibroglandular tissue causes mild palpable thickening in the outer
breast, but the tissue is mobile, and there is no palpable mass.
IMPRESSION: 1. No mammographic or sonographic evidence of malignancy involving
the RIGHT breast.
2. Island of focally dense asymmetric fibroglandular tissue in the
outer RIGHT breast which accounts for the baseline screening
mammographic finding.

RECOMMENDATION:
Screening mammogram in one year.(Code:PD-Z-8QA)

I have discussed the findings and recommendations with the patient.
If applicable, a reminder letter will be sent to the patient
regarding the next appointment.

BI-RADS CATEGORY  2: Benign.

## 2024-02-13 MED ORDER — FENTANYL CITRATE (PF) 100 MCG/2ML IJ SOLN
INTRAMUSCULAR | Status: AC
Start: 2024-02-13 — End: 2024-02-13
  Filled 2024-02-13: qty 2

## 2024-02-13 MED ORDER — MIDAZOLAM HCL 2 MG/2ML IJ SOLN
INTRAMUSCULAR | Status: AC
  Filled 2024-02-13: qty 2

## 2024-02-13 MED ORDER — IOHEXOL 300 MG/ML  SOLN
100.0000 mL | Freq: Once | INTRAMUSCULAR | Status: AC | PRN
  Administered 2024-02-13: 45 mL via INTRA_ARTERIAL

## 2024-02-13 MED ORDER — LIDOCAINE HCL 1 % IJ SOLN
20.0000 mL | Freq: Once | INTRAMUSCULAR | Status: AC
  Administered 2024-02-13: 5 mL via INTRADERMAL
  Filled 2024-02-13: qty 20

## 2024-02-13 MED ORDER — FENTANYL CITRATE (PF) 100 MCG/2ML IJ SOLN
INTRAMUSCULAR | Status: AC | PRN
  Administered 2024-02-13 (×2): 25 ug via INTRAVENOUS

## 2024-02-13 MED ORDER — DIPHENHYDRAMINE HCL 50 MG/ML IJ SOLN
INTRAMUSCULAR | Status: AC | PRN
Start: 2024-02-13 — End: 2024-02-13
  Administered 2024-02-13: 25 mg via INTRAVENOUS

## 2024-02-13 MED ORDER — HEPARIN SODIUM (PORCINE) 1000 UNIT/ML IJ SOLN
INTRAMUSCULAR | Status: AC
  Filled 2024-02-13: qty 4

## 2024-02-13 MED ORDER — MIDAZOLAM HCL 2 MG/2ML IJ SOLN
INTRAMUSCULAR | Status: AC | PRN
Start: 2024-02-13 — End: 2024-02-13
  Administered 2024-02-13: 1 mg via INTRAVENOUS

## 2024-02-13 MED ORDER — CEFAZOLIN SODIUM-DEXTROSE 2-4 GM/100ML-% IV SOLN
INTRAVENOUS | Status: AC
  Filled 2024-02-13: qty 100

## 2024-02-13 MED ORDER — LIDOCAINE HCL 1 % IJ SOLN
INTRAMUSCULAR | Status: AC
  Filled 2024-02-13: qty 20

## 2024-02-13 MED ORDER — DIPHENHYDRAMINE HCL 50 MG/ML IJ SOLN
INTRAMUSCULAR | Status: AC
  Filled 2024-02-13: qty 1

## 2024-02-13 NOTE — Plan of Care (Signed)
  Problem: Activity: Goal: Risk for activity intolerance will decrease Outcome: Progressing   Problem: Elimination: Goal: Will not experience complications related to bowel motility Outcome: Progressing Goal: Will not experience complications related to urinary retention Outcome: Progressing   Problem: Safety: Goal: Ability to remain free from injury will improve Outcome: Progressing   Problem: Skin Integrity: Goal: Risk for impaired skin integrity will decrease Outcome: Progressing   Problem: Activity: Goal: Ability to tolerate increased activity will improve Outcome: Progressing

## 2024-02-13 NOTE — Progress Notes (Signed)
 Patient arrived post procedure. Fistulagram has been complete. Access is positive for thrill and bruit with stitch in place. Venous cannulation unsuccessful due to remaining swelling. Small clots removed during cannulation. However, arterial was cannulated without difficulty. Patient states that the venous side is very deep and often difficult to cannulate. Appears to have usually one technician at the outpatient center that is able to gently cannulate the venous side of the access.

## 2024-02-13 NOTE — Plan of Care (Signed)

## 2024-02-13 NOTE — Progress Notes (Signed)
 PROGRESS NOTE  Wheeler Romans  FMW:[id number] DOB: 07-May-1981 DOA: 02/08/2024 PCP: [name], MD  Consultants  Brief Narrative: 43 y.o. female with a PMH significant for hypertension, bronchitis diabetes chronic kidney disease, necrotizing fasciitis.  Came to emergency department with complaints of left-sided sharp chest pain worse with deep inspiration.  On day of admission she had presented to hemodialysis but they were unable to dialyze her today using her AV graft on her left arm.  Patient has history graft stenosis and clots in the past with recannulation most recently April 2025 of this year.  Patient also reports that she has had pain in the sole of her right foot that has been progressively getting worse.  She has no sensation in her right leg as she had a history of necrotizing fasciitis and now has developed a large callus-like area that is severe and deep.  Patient states that bearing weight on it is made it very difficult as is walking.  No complaints of shortness of breath or cough.    Assessment & Plan: Chest pain: - Reason for admission.  Describes mostly as pleuritic chest pain. - It is unclear exact cause of her chest pain but life-threatening conditions have been ruled out including ACS, PE. - Regardless it has resolved  End-stage renal disease on hemodialysis: - History of AV graft malfunction with IR fistulogram and balloon angioplasty in April 2025.  IR has been consulted for suspected AV graft malfunction--> AV graft shuntogram performed by IR earlier today. - For dialysis afterwards. - Nephro nephrology, greatly appreciate input  Hyperkalemia: - noted today.  Mild - started on Lokelma  7/28 as we continue to await dialysis. - For dialysis today  Right foot pain: - Large callus sole of the right foot - She is relatively insensate for her entire right leg secondary to prior history of necrotizing fasciitis. - That said she does have pain when directly palpating  over her callus. - MRI shows extensive Charcot arthropathy as well as 7 cc fluid collection along plantar fascia and possible foreign body.  Partial tearing of the Lisfranc ligament.   -Consulted orthopedics for further input today, especially in light of the fact she is having pain despite being otherwise relatively insensate in her leg/foot and because of this question of foreign body.   AV graft malfunction: -     Essential hypertension: - Home regimen consists of amlodipine  and hydralazine  as well as metoprolol .  All antihypertensives have been stopped on admission secondary to hypotension concerns. -Cardiology has started Cardizem  120 mg.  Will follow blood pressures, not currently on midodrine but evidently she has history of HD associated hypotension   Diabetes mellitus type 2: Per patient diabetes has resolved, will continue with every 6 hour Accu-Cheks and coverage.   Chronic diastolic congestive heart failure: Patient is euvolemic volume management through hemodialysis.    DVT prophylaxis:  heparin  injection 5,000 Units Start: 02/08/24 2200  Code Status:   Code Status: Full Code Level of care: Telemetry Cardiac Status is: Observation  Consults called: Cardiology, nephrology  Subjective: No complaints today, feels well overall.  No dyspnea, LE edema.  CP better.    Objective: Vitals:   02/13/24 1400 02/13/24 1430 02/13/24 1500 02/13/24 1530  BP: 132/78 (!) 84/61 111/70 114/77  Pulse: (!) 105 (!) 109 97 91  Resp: 17 16 17 15   Temp:      TempSrc:      SpO2: 100% 100% 100% 100%  Weight:  Height:       No intake or output data in the 24 hours ending 02/13/24 1544  Filed Weights   02/10/24 1950 02/13/24 1220 02/13/24 1341  Weight: 78.5 kg 77 kg 77 kg   Body mass index is 28.25 kg/m.  Gen: 43 y.o. female in no apparent distress.  Nontoxic Pulm: Non-labored breathing.  Clear to auscultation bilaterally.  CV: Regular rate and rhythm.  Grade 3 systolic  ejection murmur noted. Chest: Nontender to palpation left sternal border today. GI: Abdomen soft, nondistended and nontender. Ext: Warm, trace bilateral ankle edema.  She does have 5 cm in diameter hypopigmented circular lesion right foot appearing to be a callus.  This is notably tender to palpation however. Skin: No rashes, lesions  Neuro: Alert and oriented. No focal neurological deficits. Psych: Calm  Judgement and insight appear normal. Mood & affect appropriate.     I have personally reviewed the following labs and images: CBC: Recent Labs  Lab 02/08/24 0802 02/09/24 0250 02/10/24 0252 02/11/24 1228 02/13/24 0315  WBC 6.3 6.4 5.4 6.2 6.1  NEUTROABS  --  3.0  --   --   --   HGB 10.0* 9.8* 9.7* 10.4* 8.6*  HCT 31.1* 31.4* 30.3* 33.0* 26.4*  MCV 98.1 101.6* 98.4 98.5 96.7  PLT 236 126* 211 190 160   BMP &GFR Recent Labs  Lab 02/09/24 0250 02/10/24 0252 02/11/24 1228 02/12/24 0433 02/13/24 0315  NA 139 139 138 139 140  K 4.3 4.8 5.0 5.6* 5.2*  CL 101 102 104 105 106  CO2 20* 20* 18* 20* 19*  GLUCOSE 102* 103* 122* 88 119*  BUN 62* 66* 63* 67* 71*  CREATININE 9.13* 10.22* 9.62* 10.05* 10.10*  CALCIUM  6.9* 6.9* 7.2* 7.2* 7.3*  MG 2.3  --   --   --   --   PHOS 8.9* 10.4* 8.8* 9.5*  --    Estimated Creatinine Clearance: 7.3 mL/min (A) (by C-G formula based on SCr of 10.1 mg/dL (H)). Liver & Pancreas: Recent Labs  Lab 02/08/24 1932 02/09/24 0250 02/10/24 0252 02/11/24 1228 02/12/24 0433  AST 29 33  --   --   --   ALT 26 26  --   --   --   ALKPHOS 60 61  --   --   --   BILITOT 0.8 1.1  --   --   --   PROT 5.7* 6.1*  --   --   --   ALBUMIN  2.8* 2.9* 2.8* 3.2* 2.9*   No results for input(s): LIPASE, AMYLASE in the last 168 hours. No results for input(s): AMMONIA in the last 168 hours. Diabetic: No results for input(s): HGBA1C in the last 72 hours.  No results for input(s): GLUCAP in the last 168 hours. Cardiac Enzymes: No results for input(s):  CKTOTAL, CKMB, CKMBINDEX, TROPONINI in the last 168 hours. No results for input(s): PROBNP in the last 8760 hours. Coagulation Profile: No results for input(s): INR, PROTIME in the last 168 hours. Thyroid  Function Tests: No results for input(s): TSH, T4TOTAL, FREET4, T3FREE, THYROIDAB in the last 72 hours. Lipid Profile: No results for input(s): CHOL, HDL, LDLCALC, TRIG, CHOLHDL, LDLDIRECT in the last 72 hours.  Anemia Panel: No results for input(s): VITAMINB12, FOLATE, FERRITIN, TIBC, IRON, RETICCTPCT in the last 72 hours. Urine analysis:    Component Value Date/Time   COLORURINE YELLOW 10/13/2023 1728   APPEARANCEUR HAZY (A) 10/13/2023 1728   LABSPEC 1.014 10/13/2023 1728   PHURINE 7.0  10/13/2023 1728   GLUCOSEU 150 (A) 10/13/2023 1728   HGBUR SMALL (A) 10/13/2023 1728   BILIRUBINUR NEGATIVE 10/13/2023 1728   KETONESUR NEGATIVE 10/13/2023 1728   PROTEINUR >=300 (A) 10/13/2023 1728   UROBILINOGEN 0.2 08/21/2012 2130   NITRITE NEGATIVE 10/13/2023 1728   LEUKOCYTESUR MODERATE (A) 10/13/2023 1728   Sepsis Labs: Invalid input(s): PROCALCITONIN, LACTICIDVEN  Microbiology: No results found for this or any previous visit (from the past 240 hours).  Radiology Studies: IR AV DIALY SHUNT INTRO NEEDLE/INTRACATH INITIAL W/PTA/IMG LEFT Result Date: 02/13/2024 INDICATION: End-stage renal disease, difficult cannulation and flow rates through the left upper arm AV graft EXAM: ULTRASOUND GUIDANCE FOR VASCULAR ACCESS LEFT UPPER ARM AV GRAFT SHUNTOGRAM 7 MM VENOUS ANGIOPLASTY MEDICATIONS: 1% LIDOCAINE  LOCAL ANESTHESIA/SEDATION: Moderate (conscious) sedation was employed during this procedure. A total of Versed  1.0 mg, 25 MG BENADRYL  and Fentanyl  50 mcg was administered intravenously by the radiology nurse. Total intra-service moderate Sedation Time: 16 minutes. The patient's level of consciousness and vital signs were monitored continuously by  radiology nursing throughout the procedure under my direct supervision. FLUOROSCOPY: Radiation Exposure Index (as provided by the fluoroscopic device): 24 mGy Kerma COMPLICATIONS: None immediate. PROCEDURE: Informed written consent was obtained from the patient after a thorough discussion of the procedural risks, benefits and alternatives. All questions were addressed. Maximal Sterile Barrier Technique was utilized including caps, mask, sterile gowns, sterile gloves, sterile drape, hand hygiene and skin antiseptic. A timeout was performed prior to the initiation of the procedure. Previous imaging reviewed. Initial ultrasound Angiocath access performed of the left upper arm AV graft. Shuntogram performed. Shuntogram: Arterial anastomosis to the brachial artery just above the elbow is widely patent. Outflow upper arm straight graft is patent. Moderate venous anastomotic outflow stenosis estimated at approximately 50%. There is some retrograde flow in the brachial vein noted. Outflow brachial, axillary, subclavian and innominate veins are patent. SVC patent. No central stenosis or occlusion. 7 mm angioplasty: Bentson guidewire advanced across the outflow stenosis. Six French sheath inserted. Guidewire confirmed across the stenosis of the venous outflow. Prolonged 3 minute inflation performed with a 7 x 60 Lutonix balloon. Balloon was deflated and removed. Final shuntogram demonstrates improvement in the outflow venous anastomotic stenosis. Minimal luminal narrowing remains. There is brisk graft outflow into the central veins. Access removed. Hemostasis obtained with pursestring suture. No immediate complication. Patient tolerated the procedure well. IMPRESSION: Successful 7 mm angioplasty of the venous outflow anastomotic stenosis as above. ACCESS: This access remains amenable to future percutaneous interventions as clinically indicated. Electronically Signed   By: CHRISTELLA.  Shick M.D.   On: 02/13/2024 11:50   IR US  Guide  Vasc Access Left Result Date: 02/13/2024 INDICATION: End-stage renal disease, difficult cannulation and flow rates through the left upper arm AV graft EXAM: ULTRASOUND GUIDANCE FOR VASCULAR ACCESS LEFT UPPER ARM AV GRAFT SHUNTOGRAM 7 MM VENOUS ANGIOPLASTY MEDICATIONS: 1% LIDOCAINE  LOCAL ANESTHESIA/SEDATION: Moderate (conscious) sedation was employed during this procedure. A total of Versed  1.0 mg, 25 MG BENADRYL  and Fentanyl  50 mcg was administered intravenously by the radiology nurse. Total intra-service moderate Sedation Time: 16 minutes. The patient's level of consciousness and vital signs were monitored continuously by radiology nursing throughout the procedure under my direct supervision. FLUOROSCOPY: Radiation Exposure Index (as provided by the fluoroscopic device): 24 mGy Kerma COMPLICATIONS: None immediate. PROCEDURE: Informed written consent was obtained from the patient after a thorough discussion of the procedural risks, benefits and alternatives. All questions were addressed. Maximal Sterile Barrier Technique was utilized including  caps, mask, sterile gowns, sterile gloves, sterile drape, hand hygiene and skin antiseptic. A timeout was performed prior to the initiation of the procedure. Previous imaging reviewed. Initial ultrasound Angiocath access performed of the left upper arm AV graft. Shuntogram performed. Shuntogram: Arterial anastomosis to the brachial artery just above the elbow is widely patent. Outflow upper arm straight graft is patent. Moderate venous anastomotic outflow stenosis estimated at approximately 50%. There is some retrograde flow in the brachial vein noted. Outflow brachial, axillary, subclavian and innominate veins are patent. SVC patent. No central stenosis or occlusion. 7 mm angioplasty: Bentson guidewire advanced across the outflow stenosis. Six French sheath inserted. Guidewire confirmed across the stenosis of the venous outflow. Prolonged 3 minute inflation performed with a  7 x 60 Lutonix balloon. Balloon was deflated and removed. Final shuntogram demonstrates improvement in the outflow venous anastomotic stenosis. Minimal luminal narrowing remains. There is brisk graft outflow into the central veins. Access removed. Hemostasis obtained with pursestring suture. No immediate complication. Patient tolerated the procedure well. IMPRESSION: Successful 7 mm angioplasty of the venous outflow anastomotic stenosis as above. ACCESS: This access remains amenable to future percutaneous interventions as clinically indicated. Electronically Signed   By: CHRISTELLA.  Shick M.D.   On: 02/13/2024 11:50     Scheduled Meds:  calcitRIOL   0.5 mcg Oral Daily   calcium  acetate  1,334 mg Oral TID WC   Chlorhexidine  Gluconate Cloth  6 each Topical Q0600   diltiazem   120 mg Oral q morning   heparin   5,000 Units Subcutaneous Q12H   Continuous Infusions:   LOS: 4 days   35 minutes with more than 50% spent in reviewing records, counseling patient/family and coordinating care.  Reyes VEAR Gaw, MD [facility] [email or web address] 02/13/2024, 3:44 PM

## 2024-02-13 NOTE — Progress Notes (Signed)
   02/13/24 1654  Vitals  Temp 97.8 F (36.6 C)  BP 122/79  Pulse Rate (!) 102  Resp 18  Type of Weight Post-Dialysis  Oxygen Therapy  SpO2 100 %  O2 Device Room Air  Patient Activity (if Appropriate) In bed  Oximetry Probe Site Changed No  Post Treatment  Dialyzer Clearance Clear  Liters Processed 68.2  Fluid Removed (mL) 2300 mL  Tolerated HD Treatment Yes  Post-Hemodialysis Comments Tx uneventful. Vs are stable. No verbalized concerns  AVG/AVF Arterial Site Held (minutes) 10 minutes  AVG/AVF Venous Site Held (minutes) 3 minutes

## 2024-02-13 NOTE — Progress Notes (Signed)
   02/12/24 2300  Vitals  Temp 98.9 F (37.2 C)  BP (!) 140/80  BP Location Right Arm  Pulse Rate 97  Pulse Rate Source Monitor  Resp 18  Oxygen Therapy  SpO2 100 %  O2 Device Room Air  During Treatment Monitoring  Blood Flow Rate (mL/min) 400 mL/min  Arterial Pressure (mmHg) -250 mmHg  Venous Pressure (mmHg) 250 mmHg  Dialysate Flow Rate (mL/min) 300 ml/min  Dialysate Potassium Concentration 350  Dialysate Calcium  Concentration 2  Duration of HD Treatment -hour(s) 2.5 hour(s)  HD Safety Checks Performed Yes  Intra-Hemodialysis Comments Tx initiated   Pt unable to continue dialysis  due to poor catheter flow. Nephrologist informed.

## 2024-02-13 NOTE — Progress Notes (Signed)
  [facility] Progress Note   Subjective:   Patient feels well with no complaints.  Had shuntogram with IR this morning which as successful. Temp cath left in. Tried HD overnight. Note says 2.5 hours but patient said it was only like 30 min.  Objective Vitals:   02/13/24 0925 02/13/24 0930 02/13/24 0943 02/13/24 0953  BP: 137/80 133/77 (!) 143/80 135/84  Pulse: 88 91 96 96  Resp: 15 14 16 17   Temp:    (!) 97.5 F (36.4 C)  TempSrc:      SpO2: 100% 100% 100% 100%  Weight:      Height:       Exam Gen alert, no distress No rash, cyanosis or gangrene Sclera anicteric, throat clear  No jvd or bruits Clear to auscultation bilaterally, no increased work of breathing Normal rate, no rub Abd soft ntnd no mass or ascites +bs GU deferred MS no joint effusions or deformity Ext trace LEE, warm and well perfused Neuro is alert, Ox 3 , nf    LUA AVG+bruit but weak: L.  Temporary catheter placed 02/10/24  Additional Objective Labs: Basic Metabolic Panel: Recent Labs  Lab 02/10/24 0252 02/11/24 1228 02/12/24 0433 02/13/24 0315  NA 139 138 139 140  K 4.8 5.0 5.6* 5.2*  CL 102 104 105 106  CO2 20* 18* 20* 19*  GLUCOSE 103* 122* 88 119*  BUN 66* 63* 67* 71*  CREATININE 10.22* 9.62* 10.05* 10.10*  CALCIUM  6.9* 7.2* 7.2* 7.3*  PHOS 10.4* 8.8* 9.5*  --    Liver Function Tests: Recent Labs  Lab 02/08/24 1932 02/09/24 0250 02/10/24 0252 02/11/24 1228 02/12/24 0433  AST 29 33  --   --   --   ALT 26 26  --   --   --   ALKPHOS 60 61  --   --   --   BILITOT 0.8 1.1  --   --   --   PROT 5.7* 6.1*  --   --   --   ALBUMIN  2.8* 2.9* 2.8* 3.2* 2.9*   No results for input(s): LIPASE, AMYLASE in the last 168 hours. CBC: Recent Labs  Lab 02/08/24 0802 02/09/24 0250 02/10/24 0252 02/11/24 1228 02/13/24 0315  WBC 6.3 6.4 5.4 6.2 6.1  NEUTROABS  --  3.0  --   --   --   HGB 10.0* 9.8* 9.7* 10.4* 8.6*  HCT 31.1* 31.4* 30.3* 33.0* 26.4*  MCV 98.1 101.6* 98.4 98.5  96.7  PLT 236 126* 211 190 160    Medications:   calcitRIOL   0.5 mcg Oral Daily   calcium  acetate  1,334 mg Oral TID WC   Chlorhexidine  Gluconate Cloth  6 each Topical Q0600   diltiazem   120 mg Oral q morning   heparin   5,000 Units Subcutaneous Q12H    Home bp meds: Lasix  80 every day Metoprolol  25 bid Midodrine 5mg  tid    [facility]   B500   73kg  3K bath  AVG  Heparin  3000 Gets to dry wt   Assessment/ Plan: AV graft malfunction: Unable to cannulate patient in HD. IR made aware and she had a temp catheter placed 02/10/24. Temp cath not working. No TDC placed but had shuntogram. Plan to attempt use of AVG today vs tomorrow pending nursing availability. ESRD: on HD TTS, last HD 7/22 at outpatient unit.  Plan for dialysis today or tomorrow pending nursing availability.  BP: takes midodrine and metoprolol  at home. BP'  90s- 100s here, chronic issue Volume: no vol excess on exam. Standing wt to 73kg.  Anemia of esrd: Hb 9-11 here, follow.  Pleuritic chest pain: now resolved Right foot pain: Charcot arthropathy possible osteomyelitis.  Management per primary team BMD: Phosphorus at 9.5.  Corrected calcium  around 8.  Obtain PTH.  Increased binders.  Continue home calcitriol  daily    02/13/2024, 11:22 AM  [employer]

## 2024-02-13 NOTE — Procedures (Signed)
 Interventional Radiology Procedure Note  Procedure: LUE AVGRAFT SHUNTOGRAM WITH LUTONIX PTA AT THE VENOUS ANASTOMOSIS    Complications: None  Estimated Blood Loss:  MIN  Findings: FULL REPORT IN PACS     EMERSON FREDERIC SPECKING, MD

## 2024-02-14 DIAGNOSIS — D631 Anemia in chronic kidney disease: Secondary | ICD-10-CM | POA: Diagnosis not present

## 2024-02-14 DIAGNOSIS — N186 End stage renal disease: Secondary | ICD-10-CM | POA: Diagnosis not present

## 2024-02-14 DIAGNOSIS — Z992 Dependence on renal dialysis: Secondary | ICD-10-CM | POA: Diagnosis not present

## 2024-02-14 DIAGNOSIS — I120 Hypertensive chronic kidney disease with stage 5 chronic kidney disease or end stage renal disease: Secondary | ICD-10-CM | POA: Diagnosis not present

## 2024-02-14 DIAGNOSIS — E11610 Type 2 diabetes mellitus with diabetic neuropathic arthropathy: Secondary | ICD-10-CM

## 2024-02-14 DIAGNOSIS — R079 Chest pain, unspecified: Secondary | ICD-10-CM | POA: Diagnosis not present

## 2024-02-14 DIAGNOSIS — T82590A Other mechanical complication of surgically created arteriovenous fistula, initial encounter: Secondary | ICD-10-CM | POA: Diagnosis not present

## 2024-02-14 DIAGNOSIS — R071 Chest pain on breathing: Secondary | ICD-10-CM | POA: Diagnosis not present

## 2024-02-14 LAB — CBC
HCT: 27.2 % — ABNORMAL LOW (ref 36.0–46.0)
Hemoglobin: 8.9 g/dL — ABNORMAL LOW (ref 12.0–15.0)
MCH: 31.6 pg (ref 26.0–34.0)
MCHC: 32.7 g/dL (ref 30.0–36.0)
MCV: 96.5 fL (ref 80.0–100.0)
Platelets: 155 K/uL (ref 150–400)
RBC: 2.82 MIL/uL — ABNORMAL LOW (ref 3.87–5.11)
RDW: 13.9 % (ref 11.5–15.5)
WBC: 5.9 K/uL (ref 4.0–10.5)
nRBC: 0 % (ref 0.0–0.2)

## 2024-02-14 LAB — RENAL FUNCTION PANEL
Albumin: 2.7 g/dL — ABNORMAL LOW (ref 3.5–5.0)
Anion gap: 12 (ref 5–15)
BUN: 43 mg/dL — ABNORMAL HIGH (ref 6–20)
CO2: 23 mmol/L (ref 22–32)
Calcium: 7.6 mg/dL — ABNORMAL LOW (ref 8.9–10.3)
Chloride: 102 mmol/L (ref 98–111)
Creatinine, Ser: 6.45 mg/dL — ABNORMAL HIGH (ref 0.44–1.00)
GFR, Estimated: 8 mL/min — ABNORMAL LOW (ref >60)
Glucose, Bld: 101 mg/dL — ABNORMAL HIGH (ref 70–99)
Phosphorus: 6.9 mg/dL — ABNORMAL HIGH (ref 2.5–4.6)
Potassium: 5.6 mmol/L — ABNORMAL HIGH (ref 3.5–5.1)
Sodium: 137 mmol/L (ref 135–145)

## 2024-02-14 MED ORDER — SODIUM ZIRCONIUM CYCLOSILICATE 10 G PO PACK
10.0000 g | PACK | Freq: Once | ORAL | Status: AC
  Administered 2024-02-14: 10 g via ORAL
  Filled 2024-02-14: qty 1

## 2024-02-14 NOTE — Progress Notes (Signed)
  [facility] Progress Note   Subjective:   Patient feels well.  No complaints.  Did dialysis yesterday with 1 needle in the arm and then 1 port from her catheter.  Unable to stick her graft successfully  Objective Vitals:   02/13/24 2046 02/13/24 2335 02/14/24 0318 02/14/24 0804  BP: 137/66 (!) 98/59 122/78 114/72  Pulse: 100 92 94 97  Resp: 16 20 20 18   Temp: 98.5 F (36.9 C) 98.5 F (36.9 C) 98.3 F (36.8 C) 99.6 F (37.6 C)  TempSrc: Oral     SpO2: 100% 100% 100% 100%  Weight:      Height:       Exam Gen alert, no distress No rash, cyanosis or gangrene Sclera anicteric, throat clear  No jvd or bruits Bilateral chest rise no increased work of breathing Normal rate, no rub Abd soft ntnd no mass or ascites +bs GU deferred MS no joint effusions or deformity Ext trace LEE, warm and well perfused Neuro is alert, Ox 3 , nf    LUA AVG+bruit but weak: L.  Temporary catheter placed 02/10/24  Additional Objective Labs: Basic Metabolic Panel: Recent Labs  Lab 02/11/24 1228 02/12/24 0433 02/13/24 0315 02/14/24 0312  NA 138 139 140 137  K 5.0 5.6* 5.2* 5.6*  CL 104 105 106 102  CO2 18* 20* 19* 23  GLUCOSE 122* 88 119* 101*  BUN 63* 67* 71* 43*  CREATININE 9.62* 10.05* 10.10* 6.45*  CALCIUM  7.2* 7.2* 7.3* 7.6*  PHOS 8.8* 9.5*  --  6.9*   Liver Function Tests: Recent Labs  Lab 02/08/24 1932 02/09/24 0250 02/10/24 0252 02/11/24 1228 02/12/24 0433 02/14/24 0312  AST 29 33  --   --   --   --   ALT 26 26  --   --   --   --   ALKPHOS 60 61  --   --   --   --   BILITOT 0.8 1.1  --   --   --   --   PROT 5.7* 6.1*  --   --   --   --   ALBUMIN  2.8* 2.9*   < > 3.2* 2.9* 2.7*   < > = values in this interval not displayed.   No results for input(s): LIPASE, AMYLASE in the last 168 hours. CBC: Recent Labs  Lab 02/09/24 0250 02/10/24 0252 02/11/24 1228 02/13/24 0315 02/14/24 0312  WBC 6.4 5.4 6.2 6.1 5.9  NEUTROABS 3.0  --   --   --   --   HGB  9.8* 9.7* 10.4* 8.6* 8.9*  HCT 31.4* 30.3* 33.0* 26.4* 27.2*  MCV 101.6* 98.4 98.5 96.7 96.5  PLT 126* 211 190 160 155    Medications:   calcitRIOL   0.5 mcg Oral Daily   calcium  acetate  1,334 mg Oral TID WC   Chlorhexidine  Gluconate Cloth  6 each Topical Q0600   diltiazem   120 mg Oral q morning   heparin   5,000 Units Subcutaneous Q12H    Home bp meds: Lasix  80 every day Metoprolol  25 bid Midodrine 5mg  tid    [facility]   B500   73kg  3K bath  AVG  Heparin  3000 Gets to dry wt   Assessment/ Plan: AV graft malfunction: Unable to cannulate patient in HD. IR made aware and she had a temp catheter placed 02/10/24. Temp cath not working. No TDC placed but had shuntogram.  Unable to access AVG on 7/29.  Attempt dialysis again in the morning and if unable to access graft successfully we will consult IR for [facility] ESRD: on HD TTS, Plan for HD tomorrow per schedule BP: takes midodrine and metoprolol  at home. BP' 90s- 100s here, chronic issue Volume: no vol excess on exam. Standing wt to 73kg.  Anemia of esrd: Hb 9-11 here, follow.  Pleuritic chest pain: now resolved Right foot pain: Charcot arthropathy possible osteomyelitis.  Management per primary team and orthopedics BMD: Cont binders and calcitriol     02/14/2024, 12:05 PM  [employer]

## 2024-02-14 NOTE — Progress Notes (Signed)
 PROGRESS NOTE  Jacqueline [name] FMW:[id number] DOB: Dec 09, 1980 DOA: 02/08/2024 PCP: [name], MD   LOS: 5 days   Brief Narrative / Interim history: 43 y.o. female with a PMH significant for hypertension, bronchitis diabetes chronic kidney disease, necrotizing fasciitis.  Came to emergency department with complaints of left-sided sharp chest pain worse with deep inspiration.  On day of admission she had presented to hemodialysis but they were unable to dialyze her today using her AV graft on her left arm.  Patient has history graft stenosis and clots in the past with recannulation most recently April 2025 of this year.  Patient also reports that she has had pain in the sole of her right foot that has been progressively getting worse.  She has no sensation in her right leg as she had a history of necrotizing fasciitis and now has developed a large callus-like area that is severe and deep.  Patient states that bearing weight on it is made it very difficult as is walking.  No complaints of shortness of breath or cough.   Subjective / 24h Interval events: She is doing well.  Wants to go home.  Assesement and Plan: Principal Problem:   Chest pain Active Problems:   Demand ischemia (HCC)   Sleep apnea in adult   Benign hypertension with ESRD (end-stage renal disease) (HCC)   Chronic diastolic heart failure (HCC)   Charcot's arthropathy associated with type 2 diabetes mellitus (HCC)   Principal problem Chest pain -patient was admitted to the hospital with chest pain, described mostly as pleuritic.  It comes and goes, overall improved.  Cardiology consulted and followed patient while hospitalized, life-threatening conditions have been ruled out including ACS, PE.   Active problems End-stage renal disease on hemodialysis - History of AV graft malfunction with IR fistulogram and balloon angioplasty in April 2025.  IR has been consulted for suspected AV graft malfunction--> AV graft shuntogram  performed by IR 7/29. - Following that, could not undergo dialysis as her AV graft was not able to be cannulated x 2, and temporary HD line had to be used.  Discussed with nephrology, consult vascular today -Will have repeat HD tomorrow   Hyperkalemia -on Lokelma , HD   Right foot pain - Large callus sole of the right foot. MRI shows extensive Charcot arthropathy as well as 7 cc fluid collection along plantar fascia and possible foreign body.  Partial tearing of the Lisfranc ligament.  Orthopedic surgery consulted, discussed with Dr. Harden, will be followed as an outpatient, does not need antibiotics or surgery right now   Essential hypertension - Home regimen consists of amlodipine  and hydralazine  as well as metoprolol .  All antihypertensives have been stopped on admission secondary to hypotension concerns. Cardiology has started Cardizem  120 mg.  Will follow blood pressures, not currently on midodrine but evidently she has history of HD associated hypotension   Diabetes mellitus type 2 - Per patient diabetes has resolved, will continue with every 6 hour Accu-Cheks and coverage.   Chronic diastolic congestive heart failure -euvolemic  Scheduled Meds:  calcitRIOL   0.5 mcg Oral Daily   calcium  acetate  1,334 mg Oral TID WC   Chlorhexidine  Gluconate Cloth  6 each Topical Q0600   diltiazem   120 mg Oral q morning   heparin   5,000 Units Subcutaneous Q12H   Continuous Infusions: PRN Meds:.acetaminophen , diphenhydrAMINE , HYDROmorphone  (DILAUDID ) injection, nitroGLYCERIN , ondansetron  (ZOFRAN ) IV, senna  Current Outpatient Medications  Medication Instructions   acetaminophen  (TYLENOL ) 1,000 mg, 2 times daily PRN  calcitRIOL  (ROCALTROL ) 1.5 mcg, Oral, Every T-Th-Sa (Hemodialysis), Prior to dialysis on Tuesday, Thursday, Saturdays only   calcium  acetate (PHOSLO ) 1,334 mg, Oral, 3 times daily with meals   furosemide  (LASIX ) 80 mg, Oral, Daily   metoprolol  tartrate (LOPRESSOR ) 25 mg, Oral, 2 times  daily   midodrine (PROAMATINE) 5 mg, Oral, 3 times daily with meals   Vitamin D  (Ergocalciferol ) (DRISDOL ) 50,000 Units, Oral, Every Thu    Diet Orders (From admission, onward)     Start     Ordered   02/12/24 1435  Diet renal with fluid restriction Fluid restriction: 2000 mL Fluid; Room service appropriate? Yes; Fluid consistency: Thin  Diet effective now       Question Answer Comment  Fluid restriction: 2000 mL Fluid   Room service appropriate? Yes   Fluid consistency: Thin      02/12/24 1434            DVT prophylaxis: heparin  injection 5,000 Units Start: 02/08/24 2200   Lab Results  Component Value Date   PLT 155 02/14/2024      Code Status: Full Code  Family Communication: no family at bedside   Status is: Inpatient Remains inpatient appropriate because: severity of illness  Level of care: Telemetry Cardiac  Consultants:  Nephrology   Objective: Vitals:   02/13/24 2046 02/13/24 2335 02/14/24 0318 02/14/24 0804  BP: 137/66 (!) 98/59 122/78 114/72  Pulse: 100 92 94 97  Resp: 16 20 20 18   Temp: 98.5 F (36.9 C) 98.5 F (36.9 C) 98.3 F (36.8 C) 99.6 F (37.6 C)  TempSrc: Oral     SpO2: 100% 100% 100% 100%  Weight:      Height:        Intake/Output Summary (Last 24 hours) at 02/14/2024 1119 Last data filed at 02/13/2024 2046 Gross per 24 hour  Intake 100 ml  Output 2300 ml  Net -2200 ml   Wt Readings from Last 3 Encounters:  02/13/24 77 kg  12/30/23 72.9 kg  11/29/23 78 kg    Examination:  Constitutional: NAD Eyes: no scleral icterus ENMT: Mucous membranes are moist.  Neck: normal, supple Respiratory: clear to auscultation bilaterally, no wheezing, no crackles.  Cardiovascular: Regular rate and rhythm, no murmurs / rubs / gallops. No LE edema.  Abdomen: non distended, no tenderness. Bowel sounds positive.  Musculoskeletal: no clubbing / cyanosis.    Data Reviewed: I have independently reviewed following labs and imaging studies    CBC Recent Labs  Lab 02/09/24 0250 02/10/24 0252 02/11/24 1228 02/13/24 0315 02/14/24 0312  WBC 6.4 5.4 6.2 6.1 5.9  HGB 9.8* 9.7* 10.4* 8.6* 8.9*  HCT 31.4* 30.3* 33.0* 26.4* 27.2*  PLT 126* 211 190 160 155  MCV 101.6* 98.4 98.5 96.7 96.5  MCH 31.7 31.5 31.0 31.5 31.6  MCHC 31.2 32.0 31.5 32.6 32.7  RDW 15.0 14.6 14.5 14.1 13.9  LYMPHSABS 2.5  --   --   --   --   MONOABS 0.5  --   --   --   --   EOSABS 0.4  --   --   --   --   BASOSABS 0.1  --   --   --   --     Recent Labs  Lab 02/08/24 1932 02/09/24 0250 02/09/24 1116 02/10/24 0252 02/11/24 1228 02/12/24 0433 02/13/24 0315 02/14/24 0312  NA  --  139  --  139 138 139 140 137  K  --  4.3  --  4.8 5.0 5.6* 5.2* 5.6*  CL  --  101  --  102 104 105 106 102  CO2  --  20*  --  20* 18* 20* 19* 23  GLUCOSE  --  102*  --  103* 122* 88 119* 101*  BUN  --  62*  --  66* 63* 67* 71* 43*  CREATININE  --  9.13*  --  10.22* 9.62* 10.05* 10.10* 6.45*  CALCIUM   --  6.9*  --  6.9* 7.2* 7.2* 7.3* 7.6*  AST 29 33  --   --   --   --   --   --   ALT 26 26  --   --   --   --   --   --   ALKPHOS 60 61  --   --   --   --   --   --   BILITOT 0.8 1.1  --   --   --   --   --   --   ALBUMIN  2.8* 2.9*  --  2.8* 3.2* 2.9*  --  2.7*  MG  --  2.3  --   --   --   --   --   --   CRP  --   --  0.6  --   --   --   --   --   HGBA1C  --  4.3*  --   --   --   --   --   --     ------------------------------------------------------------------------------------------------------------------ No results for input(s): CHOL, HDL, LDLCALC, TRIG, CHOLHDL, LDLDIRECT in the last 72 hours.  Lab Results  Component Value Date   HGBA1C 4.3 (L) 02/09/2024   ------------------------------------------------------------------------------------------------------------------ No results for input(s): TSH, T4TOTAL, T3FREE, THYROIDAB in the last 72 hours.  Invalid input(s): FREET3  Cardiac Enzymes No results for input(s): CKMB,  TROPONINI, MYOGLOBIN in the last 168 hours.  Invalid input(s): CK ------------------------------------------------------------------------------------------------------------------    Component Value Date/Time   BNP 103.5 (H) 10/11/2023 0920    CBG: No results for input(s): GLUCAP in the last 168 hours.  No results found for this or any previous visit (from the past 240 hours).   Radiology Studies: No results found.   Nilda Fendt, MD, PhD [facility]  Between 7 am - 7 pm I am available, please contact me via Amion (for emergencies) or Securechat (non urgent messages)  Between 7 pm - 7 am I am not available, please contact night coverage MD/APP via Amion

## 2024-02-14 NOTE — Consult Note (Signed)
 ORTHOPAEDIC CONSULTATION  REQUESTING PHYSICIAN: Trixie Nilda HERO, MD  Chief Complaint: Charcot collapse right foot.  HPI: Jacqueline [name] is a 43 y.o. female who presents with diabetic insensate neuropathy with Charcot arthropathy of the right foot.  Patient initially presented with chest pain and is now having trouble with her dialysis access.  Past Medical History:  Diagnosis Date   Anemia    Anxiety    Chronic bronchitis (HCC)    Chronic kidney disease    Diabetes (HCC) 01/01/2014   Hypertension    Increased frequency of headaches    Morbid obesity (HCC)    Necrotizing fasciitis (HCC)    Sleep apnea    does not use CPAP - - gastric bypass   Type II diabetes mellitus (HCC)    no meds, diet controlled   Past Surgical History:  Procedure Laterality Date   A/V SHUNT INTERVENTION N/A 11/17/2023   Procedure: A/V SHUNT INTERVENTION;  Surgeon: Tobie Gordy POUR, MD;  Location: [facility] INVASIVE CV LAB;  Service: Cardiovascular;  Laterality: N/A;  80% venous anastomosis   AV FISTULA PLACEMENT Left 05/02/2023   Procedure: LEFT ARM ARTERIOVENOUS (AV) FISTULA  GRAFT;  Surgeon: Pearline Norman RAMAN, MD;  Location: [facility] OR;  Service: Vascular;  Laterality: Left;   BARIATRIC SURGERY     BREAST REDUCTION SURGERY  03/21/2017   CARDIAC CATHETERIZATION  01/06/2016   CARDIAC CATHETERIZATION N/A 01/06/2016   Procedure: Left Heart Cath and Coronary Angiography;  Surgeon: Deatrice DELENA Cage, MD;  Location: [facility] INVASIVE CV LAB;  Service: Cardiovascular;  Laterality: N/A;   CESAREAN SECTION  08/2014   I & D EXTREMITY Right 06/18/2022   Procedure: IRRIGATION AND DEBRIDEMENT RIGHT ABDOMEN AND THIGH;  Surgeon: Kinsinger, Herlene Righter, MD;  Location: [facility] OR;  Service: General;  Laterality: Right;   I & D EXTREMITY Right 06/20/2022   Procedure: WOUND EXPLORATION OF RIGHT THIGH AND RIGHT GROIN WITH IRRIGATION AND DEBRIDEMENT;  Surgeon: Curvin Deward MOULD, MD;  Location: [facility] OR;  Service: General;  Laterality: Right;   IR AV DIALY  SHUNT INTRO NEEDLE/INTRACATH INITIAL W/PTA/IMG LEFT  10/19/2023   IR AV DIALY SHUNT INTRO NEEDLE/INTRACATH INITIAL W/PTA/IMG LEFT  02/13/2024   IR DIALY SHUNT INTRO NEEDLE/INTRACATH INITIAL W/IMG LEFT Left 11/29/2023   IR FLUORO GUIDE CV LINE RIGHT  02/10/2024   IR US  GUIDE VASC ACCESS LEFT  02/13/2024   IR US  GUIDE VASC ACCESS RIGHT  02/10/2024   REDUCTION MAMMAPLASTY Bilateral 03/21/2017   TRANSESOPHAGEAL ECHOCARDIOGRAM (CATH LAB) N/A 10/16/2023   Procedure: TRANSESOPHAGEAL ECHOCARDIOGRAM;  Surgeon: Barbaraann Darryle Ned, MD;  Location: [facility] INVASIVE CV LAB;  Service: Cardiovascular;  Laterality: N/A;   Social History   Socioeconomic History   Marital status: Married    Spouse name: Not on file   Number of children: Not on file   Years of education: Not on file   Highest education level: Not on file  Occupational History   Not on file  Tobacco Use   Smoking status: Never   Smokeless tobacco: Never  Vaping Use   Vaping status: Never Used  Substance and Sexual Activity   Alcohol use: No   Drug use: No   Sexual activity: Yes    Birth control/protection: None  Other Topics Concern   Not on file  Social History Narrative   Not on file   Social Drivers of Health   Financial Resource Strain: Not on file  Food Insecurity: No Food Insecurity (02/08/2024)   Hunger Vital Sign  Worried About Programme researcher, broadcasting/film/video in the Last Year: Never true    Ran Out of Food in the Last Year: Never true  Transportation Needs: No Transportation Needs (02/08/2024)   PRAPARE - Administrator, Civil Service (Medical): No    Lack of Transportation (Non-Medical): No  Physical Activity: Not on file  Stress: Not on file  Social Connections: Socially Integrated (10/11/2023)   Social Connection and Isolation Panel    Frequency of Communication with Friends and Family: More than three times a week    Frequency of Social Gatherings with Friends and Family: More than three times a week    Attends  Religious Services: 1 to 4 times per year    Active Member of [employer] or Organizations: Yes    Attends Engineer, structural: More than 4 times per year    Marital Status: Married   Family History  Problem Relation Age of Onset   Diabetes Mother    Hypertension Mother    Thyroid  disease Mother    Kidney disease Maternal Grandmother    Diabetes Maternal Grandmother    Heart attack Other    - negative except otherwise stated in the family history section No Known Allergies Prior to Admission medications   Medication Sig Start Date End Date Taking? Authorizing Provider  acetaminophen  (TYLENOL ) 500 MG tablet Take 1,000 mg by mouth 2 (two) times daily as needed for moderate pain (pain score 4-6), fever or headache.   Yes [provider]  calcitRIOL  (ROCALTROL ) 0.5 MCG capsule Take 1.5 mcg by mouth Every Tuesday,Thursday,and Saturday with dialysis. Prior to dialysis on Tuesday, Thursday, Saturdays only 10/03/23  Yes [provider]  calcium  acetate (PHOSLO ) 667 MG capsule Take 1,334 mg by mouth 3 (three) times daily with meals.   Yes [provider]  furosemide  (LASIX ) 40 MG tablet Take 80 mg by mouth daily. 11/15/23  Yes [provider]  midodrine (PROAMATINE) 5 MG tablet Take 5 mg by mouth 3 (three) times daily with meals.   Yes [provider]  Vitamin D , Ergocalciferol , (DRISDOL ) 1.25 MG (50000 UNIT) CAPS capsule Take 1 capsule (50,000 Units total) by mouth every Thursday. 07/07/22  Yes Angiulli, Toribio PARAS, PA-C  metoprolol  tartrate (LOPRESSOR ) 25 MG tablet Take 1 tablet (25 mg total) by mouth 2 (two) times daily. Patient not taking: Reported on 02/09/2024 10/20/23 01/18/24  [place], Camellia PARAS, DO   IR AV DIALY SHUNT INTRO NEEDLE/INTRACATH INITIAL W/PTA/IMG LEFT Result Date: 02/13/2024 INDICATION: End-stage renal disease, difficult cannulation and flow rates through the left upper arm AV graft EXAM: ULTRASOUND GUIDANCE FOR VASCULAR ACCESS LEFT UPPER  ARM AV GRAFT SHUNTOGRAM 7 MM VENOUS ANGIOPLASTY MEDICATIONS: 1% LIDOCAINE  LOCAL ANESTHESIA/SEDATION: Moderate (conscious) sedation was employed during this procedure. A total of Versed  1.0 mg, 25 MG BENADRYL  and Fentanyl  50 mcg was administered intravenously by the radiology nurse. Total intra-service moderate Sedation Time: 16 minutes. The patient's level of consciousness and vital signs were monitored continuously by radiology nursing throughout the procedure under my direct supervision. FLUOROSCOPY: Radiation Exposure Index (as provided by the fluoroscopic device): 24 mGy Kerma COMPLICATIONS: None immediate. PROCEDURE: Informed written consent was obtained from the patient after a thorough discussion of the procedural risks, benefits and alternatives. All questions were addressed. Maximal Sterile Barrier Technique was utilized including caps, mask, sterile gowns, sterile gloves, sterile drape, hand hygiene and skin antiseptic. A timeout was performed prior to the initiation of the procedure. Previous imaging reviewed. Initial ultrasound Angiocath  access performed of the left upper arm AV graft. Shuntogram performed. Shuntogram: Arterial anastomosis to the brachial artery just above the elbow is widely patent. Outflow upper arm straight graft is patent. Moderate venous anastomotic outflow stenosis estimated at approximately 50%. There is some retrograde flow in the brachial vein noted. Outflow brachial, axillary, subclavian and innominate veins are patent. SVC patent. No central stenosis or occlusion. 7 mm angioplasty: Bentson guidewire advanced across the outflow stenosis. Six French sheath inserted. Guidewire confirmed across the stenosis of the venous outflow. Prolonged 3 minute inflation performed with a 7 x 60 Lutonix balloon. Balloon was deflated and removed. Final shuntogram demonstrates improvement in the outflow venous anastomotic stenosis. Minimal luminal narrowing remains. There is brisk graft outflow  into the central veins. Access removed. Hemostasis obtained with pursestring suture. No immediate complication. Patient tolerated the procedure well. IMPRESSION: Successful 7 mm angioplasty of the venous outflow anastomotic stenosis as above. ACCESS: This access remains amenable to future percutaneous interventions as clinically indicated. Electronically Signed   By: CHRISTELLA.  Shick M.D.   On: 02/13/2024 11:50   IR US  Guide Vasc Access Left Result Date: 02/13/2024 INDICATION: End-stage renal disease, difficult cannulation and flow rates through the left upper arm AV graft EXAM: ULTRASOUND GUIDANCE FOR VASCULAR ACCESS LEFT UPPER ARM AV GRAFT SHUNTOGRAM 7 MM VENOUS ANGIOPLASTY MEDICATIONS: 1% LIDOCAINE  LOCAL ANESTHESIA/SEDATION: Moderate (conscious) sedation was employed during this procedure. A total of Versed  1.0 mg, 25 MG BENADRYL  and Fentanyl  50 mcg was administered intravenously by the radiology nurse. Total intra-service moderate Sedation Time: 16 minutes. The patient's level of consciousness and vital signs were monitored continuously by radiology nursing throughout the procedure under my direct supervision. FLUOROSCOPY: Radiation Exposure Index (as provided by the fluoroscopic device): 24 mGy Kerma COMPLICATIONS: None immediate. PROCEDURE: Informed written consent was obtained from the patient after a thorough discussion of the procedural risks, benefits and alternatives. All questions were addressed. Maximal Sterile Barrier Technique was utilized including caps, mask, sterile gowns, sterile gloves, sterile drape, hand hygiene and skin antiseptic. A timeout was performed prior to the initiation of the procedure. Previous imaging reviewed. Initial ultrasound Angiocath access performed of the left upper arm AV graft. Shuntogram performed. Shuntogram: Arterial anastomosis to the brachial artery just above the elbow is widely patent. Outflow upper arm straight graft is patent. Moderate venous anastomotic outflow  stenosis estimated at approximately 50%. There is some retrograde flow in the brachial vein noted. Outflow brachial, axillary, subclavian and innominate veins are patent. SVC patent. No central stenosis or occlusion. 7 mm angioplasty: Bentson guidewire advanced across the outflow stenosis. Six French sheath inserted. Guidewire confirmed across the stenosis of the venous outflow. Prolonged 3 minute inflation performed with a 7 x 60 Lutonix balloon. Balloon was deflated and removed. Final shuntogram demonstrates improvement in the outflow venous anastomotic stenosis. Minimal luminal narrowing remains. There is brisk graft outflow into the central veins. Access removed. Hemostasis obtained with pursestring suture. No immediate complication. Patient tolerated the procedure well. IMPRESSION: Successful 7 mm angioplasty of the venous outflow anastomotic stenosis as above. ACCESS: This access remains amenable to future percutaneous interventions as clinically indicated. Electronically Signed   By: CHRISTELLA.  Shick M.D.   On: 02/13/2024 11:50   - pertinent xrays, CT, MRI studies were reviewed and independently interpreted  Positive ROS: All other systems have been reviewed and were otherwise negative with the exception of those mentioned in the HPI and as above.  Physical Exam: General: Alert, no acute distress Psychiatric: Patient is  competent for consent with normal mood and affect Lymphatic: No axillary or cervical lymphadenopathy Cardiovascular: No pedal edema Respiratory: No cyanosis, no use of accessory musculature GI: No organomegaly, abdomen is soft and non-tender    Images:  @ENCIMAGES @  Labs:  Lab Results  Component Value Date   HGBA1C 4.3 (L) 02/09/2024   HGBA1C 4.4 (L) 10/12/2023   HGBA1C 5.4 06/21/2022   ESRSEDRATE 22 02/09/2024   ESRSEDRATE 93 (H) 05/04/2018   CRP 0.6 02/09/2024   CRP 7.3 (H) 08/17/2020   CRP 0.6 07/07/2019   REPTSTATUS 10/20/2023 FINAL 10/15/2023   REPTSTATUS  10/20/2023 FINAL 10/15/2023   GRAMSTAIN  06/18/2022    RARE WBC PRESENT, PREDOMINANTLY PMN FEW GRAM POSITIVE COCCI FEW GRAM NEGATIVE RODS Performed at [facility], [address], [place], [place] [address]    CULT  10/15/2023    NO GROWTH 5 DAYS Performed at [facility], [address], [place], [place] [address]    CULT  10/15/2023    NO GROWTH 5 DAYS Performed at [facility], [address], [place], [place] [address]    [facility] STREPTOCOCCUS MUTANS 10/13/2023    Lab Results  Component Value Date   ALBUMIN  2.7 (L) 02/14/2024   ALBUMIN  2.9 (L) 02/12/2024   ALBUMIN  3.2 (L) 02/11/2024   PREALBUMIN 15.4 (L) 08/18/2020        Latest Ref Rng & Units 02/14/2024    3:12 AM 02/13/2024    3:15 AM 02/11/2024   12:28 PM  CBC EXTENDED  WBC 4.0 - 10.5 K/uL 5.9  6.1  6.2   RBC 3.87 - 5.11 MIL/uL 2.82  2.73  3.35   Hemoglobin 12.0 - 15.0 g/dL 8.9  8.6  89.5   HCT 63.9 - 46.0 % 27.2  26.4  33.0   Platelets 150 - 400 K/uL 155  160  190     Neurologic: Patient does not have protective sensation bilateral lower extremities.   MUSCULOSKELETAL:   Skin: Examination of the right foot patient has a stable Charcot collapse.  There is no redness no warmth no signs of active Charcot process.  Patient beneath the Charcot rocker-bottom deformity does have some fluctuation with edema secondary to pressure.  There is a callus.  Patient has a strong palpable dorsalis pedis pulse bilaterally.  Radiographs show Charcot collapse.  The MRI scan shows a stable Charcot arthropathy without evidence of osteomyelitis.  There is a fluid collection secondary to the pressure from the rocker-bottom deformity.  Examination of the left foot there is no deformity no Charcot arthropathy there are calluses with a cavovarus foot.  Assessment: Assessment: Stable Charcot arthropathy right foot without evidence of infection or active Charcot process.  Plan: Plan: I will follow-up in the office  after discharge or patient can follow-up at [facility].  Thank you for the consult and the opportunity to see Jacqueline [name]  Jacqueline Sage, MD [facility] [phone] 7:23 AM

## 2024-02-14 NOTE — Plan of Care (Signed)
  Problem: Activity: Goal: Risk for activity intolerance will decrease Outcome: Progressing   Problem: Safety: Goal: Ability to remain free from injury will improve Outcome: Progressing   Problem: Skin Integrity: Goal: Risk for impaired skin integrity will decrease Outcome: Progressing   Problem: Activity: Goal: Ability to tolerate increased activity will improve Outcome: Progressing

## 2024-02-15 ENCOUNTER — Encounter (HOSPITAL_COMMUNITY): Payer: Self-pay | Admitting: Family Medicine

## 2024-02-15 ENCOUNTER — Inpatient Hospital Stay (HOSPITAL_COMMUNITY)

## 2024-02-15 DIAGNOSIS — R071 Chest pain on breathing: Secondary | ICD-10-CM | POA: Diagnosis not present

## 2024-02-15 DIAGNOSIS — T82590A Other mechanical complication of surgically created arteriovenous fistula, initial encounter: Secondary | ICD-10-CM | POA: Diagnosis not present

## 2024-02-15 DIAGNOSIS — D631 Anemia in chronic kidney disease: Secondary | ICD-10-CM | POA: Diagnosis not present

## 2024-02-15 DIAGNOSIS — R079 Chest pain, unspecified: Secondary | ICD-10-CM | POA: Diagnosis not present

## 2024-02-15 DIAGNOSIS — Z992 Dependence on renal dialysis: Secondary | ICD-10-CM | POA: Diagnosis not present

## 2024-02-15 DIAGNOSIS — I120 Hypertensive chronic kidney disease with stage 5 chronic kidney disease or end stage renal disease: Secondary | ICD-10-CM | POA: Diagnosis not present

## 2024-02-15 DIAGNOSIS — N186 End stage renal disease: Secondary | ICD-10-CM | POA: Diagnosis not present

## 2024-02-15 HISTORY — PX: IR FLUORO GUIDE CV LINE RIGHT: IMG2283

## 2024-02-15 LAB — COMPREHENSIVE METABOLIC PANEL WITH GFR
ALT: 37 U/L (ref 0–44)
AST: 50 U/L — ABNORMAL HIGH (ref 15–41)
Albumin: 2.5 g/dL — ABNORMAL LOW (ref 3.5–5.0)
Alkaline Phosphatase: 71 U/L (ref 38–126)
Anion gap: 13 (ref 5–15)
BUN: 52 mg/dL — ABNORMAL HIGH (ref 6–20)
CO2: 21 mmol/L — ABNORMAL LOW (ref 22–32)
Calcium: 7.6 mg/dL — ABNORMAL LOW (ref 8.9–10.3)
Chloride: 105 mmol/L (ref 98–111)
Creatinine, Ser: 7.76 mg/dL — ABNORMAL HIGH (ref 0.44–1.00)
GFR, Estimated: 6 mL/min — ABNORMAL LOW (ref >60)
Glucose, Bld: 110 mg/dL — ABNORMAL HIGH (ref 70–99)
Potassium: 4.5 mmol/L (ref 3.5–5.1)
Sodium: 139 mmol/L (ref 135–145)
Total Bilirubin: 0.6 mg/dL (ref 0.0–1.2)
Total Protein: 5.5 g/dL — ABNORMAL LOW (ref 6.5–8.1)

## 2024-02-15 LAB — CBC
HCT: 26.9 % — ABNORMAL LOW (ref 36.0–46.0)
Hemoglobin: 8.6 g/dL — ABNORMAL LOW (ref 12.0–15.0)
MCH: 31.2 pg (ref 26.0–34.0)
MCHC: 32 g/dL (ref 30.0–36.0)
MCV: 97.5 fL (ref 80.0–100.0)
Platelets: 173 K/uL (ref 150–400)
RBC: 2.76 MIL/uL — ABNORMAL LOW (ref 3.87–5.11)
RDW: 13.8 % (ref 11.5–15.5)
WBC: 6.1 K/uL (ref 4.0–10.5)
nRBC: 0 % (ref 0.0–0.2)

## 2024-02-15 LAB — IRON AND TIBC
Iron: 47 ug/dL (ref 28–170)
Saturation Ratios: 31 % (ref 10.4–31.8)
TIBC: 151 ug/dL — ABNORMAL LOW (ref 250–450)
UIBC: 104 ug/dL

## 2024-02-15 LAB — FERRITIN: Ferritin: 375 ng/mL — ABNORMAL HIGH (ref 11–307)

## 2024-02-15 LAB — MAGNESIUM: Magnesium: 2.2 mg/dL (ref 1.7–2.4)

## 2024-02-15 MED ORDER — CEFAZOLIN SODIUM-DEXTROSE 2-4 GM/100ML-% IV SOLN
INTRAVENOUS | Status: AC | PRN
  Administered 2024-02-15: 2 g via INTRAVENOUS

## 2024-02-15 MED ORDER — CEFAZOLIN SODIUM-DEXTROSE 2-4 GM/100ML-% IV SOLN
INTRAVENOUS | Status: AC
  Filled 2024-02-15: qty 100

## 2024-02-15 MED ORDER — LIDOCAINE-EPINEPHRINE 1 %-1:100000 IJ SOLN
INTRAMUSCULAR | Status: AC
  Filled 2024-02-15: qty 1

## 2024-02-15 MED ORDER — HEPARIN SODIUM (PORCINE) 1000 UNIT/ML IJ SOLN
INTRAMUSCULAR | Status: AC
  Filled 2024-02-15: qty 2

## 2024-02-15 MED ORDER — MIDAZOLAM HCL 2 MG/2ML IJ SOLN
INTRAMUSCULAR | Status: AC | PRN
  Administered 2024-02-15: 1 mg via INTRAVENOUS

## 2024-02-15 MED ORDER — CHLORHEXIDINE GLUCONATE 4 % EX SOLN
CUTANEOUS | Status: AC
  Filled 2024-02-15: qty 15

## 2024-02-15 MED ORDER — MIDAZOLAM HCL 2 MG/2ML IJ SOLN
INTRAMUSCULAR | Status: AC
  Filled 2024-02-15: qty 2

## 2024-02-15 MED ORDER — HEPARIN SODIUM (PORCINE) 1000 UNIT/ML IJ SOLN
INTRAMUSCULAR | Status: AC
  Filled 2024-02-15: qty 10

## 2024-02-15 MED ORDER — HEPARIN SODIUM (PORCINE) 1000 UNIT/ML IJ SOLN: 3800.0000 [IU] | Freq: Once | INTRAMUSCULAR | Status: DC

## 2024-02-15 MED ORDER — FENTANYL CITRATE (PF) 100 MCG/2ML IJ SOLN
INTRAMUSCULAR | Status: AC
Start: 2024-02-15 — End: 2024-02-15
  Filled 2024-02-15: qty 2

## 2024-02-15 MED ORDER — FENTANYL CITRATE (PF) 100 MCG/2ML IJ SOLN
INTRAMUSCULAR | Status: AC | PRN
  Administered 2024-02-15: 50 ug via INTRAVENOUS

## 2024-02-15 MED ORDER — LIDOCAINE-EPINEPHRINE 1 %-1:100000 IJ SOLN
20.0000 mL | Freq: Once | INTRAMUSCULAR | Status: AC
  Administered 2024-02-15: 15 mL
  Filled 2024-02-15: qty 20

## 2024-02-15 MED ORDER — DILTIAZEM HCL ER COATED BEADS 120 MG PO CP24: 120.0000 mg | ORAL_CAPSULE | Freq: Every morning | ORAL | 0 refills | Status: AC

## 2024-02-15 NOTE — Progress Notes (Signed)
 Received patient in bed.Alert and oriented x 4. Consent verified.  Access used: AVGS arterial site was cannulated successfully and worked well,but the venous site ,we feel the popped in, we're confident were in but no blood returned. HD venous catheter limb was used to complete treatment.   Duration of treatment: 3 hours  Net uf 1.6 liter.  Hemo comment: Not tolerating prescribed uf of 3 liters, blood pressure started dropping down on her last 45 minutes of her treatment:Advised patient not to eat anything for possible procedure,floor r.n. made aware.  Hand off to the patient;s nurse, with stable condition via transporter.

## 2024-02-15 NOTE — Progress Notes (Signed)
 [facility] Progress Note   Subjective:   Seen on dialysis this morning.  Feeling well.  Unable to cannulate venous needle today again.  Objective Vitals:   02/15/24 0830 02/15/24 0900 02/15/24 0930 02/15/24 1000  BP: 134/85 117/75 103/66 (!) 89/65  Pulse: 95 96 (!) 107 (!) 113  Resp: 15 10 17 17   Temp:      TempSrc:      SpO2: 100% 98% 100% 100%  Weight:      Height:       Exam Gen alert, no distress No rash, cyanosis or gangrene Sclera anicteric, throat clear  No jvd or bruits Bilateral chest rise no increased work of breathing Normal rate, no rub Abd soft ntnd no mass or ascites +bs GU deferred MS no joint effusions or deformity Ext trace LEE, warm and well perfused Neuro is alert, Ox 3 , nf    LUA AVG+bruit .  Temporary catheter placed 02/10/24  Additional Objective Labs: Basic Metabolic Panel: Recent Labs  Lab 02/11/24 1228 02/12/24 0433 02/13/24 0315 02/14/24 0312 02/15/24 0252  NA 138 139 140 137 139  K 5.0 5.6* 5.2* 5.6* 4.5  CL 104 105 106 102 105  CO2 18* 20* 19* 23 21*  GLUCOSE 122* 88 119* 101* 110*  BUN 63* 67* 71* 43* 52*  CREATININE 9.62* 10.05* 10.10* 6.45* 7.76*  CALCIUM  7.2* 7.2* 7.3* 7.6* 7.6*  PHOS 8.8* 9.5*  --  6.9*  --    Liver Function Tests: Recent Labs  Lab 02/08/24 1932 02/09/24 0250 02/10/24 0252 02/12/24 0433 02/14/24 0312 02/15/24 0252  AST 29 33  --   --   --  50*  ALT 26 26  --   --   --  37  ALKPHOS 60 61  --   --   --  71  BILITOT 0.8 1.1  --   --   --  0.6  PROT 5.7* 6.1*  --   --   --  5.5*  ALBUMIN  2.8* 2.9*   < > 2.9* 2.7* 2.5*   < > = values in this interval not displayed.   No results for input(s): LIPASE, AMYLASE in the last 168 hours. CBC: Recent Labs  Lab 02/09/24 0250 02/10/24 0252 02/11/24 1228 02/13/24 0315 02/14/24 0312 02/15/24 0252  WBC 6.4 5.4 6.2 6.1 5.9 6.1  NEUTROABS 3.0  --   --   --   --   --   HGB 9.8* 9.7* 10.4* 8.6* 8.9* 8.6*  HCT 31.4* 30.3* 33.0* 26.4* 27.2*  26.9*  MCV 101.6* 98.4 98.5 96.7 96.5 97.5  PLT 126* 211 190 160 155 173    Medications:   calcitRIOL   0.5 mcg Oral Daily   calcium  acetate  1,334 mg Oral TID WC   Chlorhexidine  Gluconate Cloth  6 each Topical Q0600   diltiazem   120 mg Oral q morning   heparin   5,000 Units Subcutaneous Q12H    Home bp meds: Lasix  80 every day Metoprolol  25 bid Midodrine 5mg  tid    OP HD: [facility] 4h   B500   73kg  3K bath  AVG  Heparin  3000 Gets to dry wt   Assessment/ Plan: AV graft malfunction: Unable to cannulate patient in HD. IR made aware and she had a temp catheter placed 02/10/24. Temp cath not working. No TDC placed but had shuntogram.  Only able to access arterial arm on AVG on 7/29 and today.  Will consult IR for tunneled dialysis catheter.  After this is placed she is okay for discharge.  Will need surgery evaluation of her AVG in the outpatient setting ESRD: on HD TTS, maintain scheduled BP: takes midodrine and metoprolol  at home. BP' 90s- 100s here, chronic issue Volume: no vol excess on exam. Standing wt to 73kg.  Anemia of esrd: Hb 8s. Obtain iron studies. Consider esa Pleuritic chest pain: now resolved Right foot pain: Charcot arthropathy possible osteomyelitis.  Management per primary team and orthopedics BMD: Cont binders and calcitriol     02/15/2024, 10:08 AM  [employer]

## 2024-02-15 NOTE — Plan of Care (Signed)

## 2024-02-15 NOTE — Consult Note (Signed)
 Chief Complaint: Dysfunctioning AV graft; long term dialysis needs - image guided temporary to tunneled dialysis catheter placement   Referring Provider(s): Macel Savant   Supervising Physician: Jennefer Rover  Patient Status: [facility] - In-pt  History of Present Illness: Jacqueline [name] is a 43 y.o. female with history of anemia, chronic bronchitis, diabetes, hypertension, and chronic kidney disease requiring dialysis.  Pt is followed by Dr. Macel of nephrology.  Pt is known to us  from recent shuntogram 02/13/24 with Dr. Vanice; her venous access via fistula is still unable to be cannulated.  Interventional radiology was consulted for temporary dialysis catheter conversion to tunneled dialysis catheter for long term dialysis use during workup and treatment of her AV graft.     Patient is Full Code  Past Medical History:  Diagnosis Date   Anemia    Anxiety    Chronic bronchitis (HCC)    Chronic kidney disease    Diabetes (HCC) 01/01/2014   Hypertension    Increased frequency of headaches    Morbid obesity (HCC)    Necrotizing fasciitis (HCC)    Sleep apnea    does not use CPAP - - gastric bypass   Type II diabetes mellitus (HCC)    no meds, diet controlled    Past Surgical History:  Procedure Laterality Date   A/V SHUNT INTERVENTION N/A 11/17/2023   Procedure: A/V SHUNT INTERVENTION;  Surgeon: Tobie Gordy POUR, MD;  Location: [facility] INVASIVE CV LAB;  Service: Cardiovascular;  Laterality: N/A;  80% venous anastomosis   AV FISTULA PLACEMENT Left 05/02/2023   Procedure: LEFT ARM ARTERIOVENOUS (AV) FISTULA  GRAFT;  Surgeon: Pearline Norman RAMAN, MD;  Location: [facility] OR;  Service: Vascular;  Laterality: Left;   BARIATRIC SURGERY     BREAST REDUCTION SURGERY  03/21/2017   CARDIAC CATHETERIZATION  01/06/2016   CARDIAC CATHETERIZATION N/A 01/06/2016   Procedure: Left Heart Cath and Coronary Angiography;  Surgeon: Deatrice DELENA Cage, MD;  Location: [facility] INVASIVE CV LAB;  Service: Cardiovascular;   Laterality: N/A;   CESAREAN SECTION  08/2014   I & D EXTREMITY Right 06/18/2022   Procedure: IRRIGATION AND DEBRIDEMENT RIGHT ABDOMEN AND THIGH;  Surgeon: Kinsinger, Herlene Righter, MD;  Location: [facility] OR;  Service: General;  Laterality: Right;   I & D EXTREMITY Right 06/20/2022   Procedure: WOUND EXPLORATION OF RIGHT THIGH AND RIGHT GROIN WITH IRRIGATION AND DEBRIDEMENT;  Surgeon: Curvin Deward MOULD, MD;  Location: [facility] OR;  Service: General;  Laterality: Right;   IR AV DIALY SHUNT INTRO NEEDLE/INTRACATH INITIAL W/PTA/IMG LEFT  10/19/2023   IR AV DIALY SHUNT INTRO NEEDLE/INTRACATH INITIAL W/PTA/IMG LEFT  02/13/2024   IR DIALY SHUNT INTRO NEEDLE/INTRACATH INITIAL W/IMG LEFT Left 11/29/2023   IR FLUORO GUIDE CV LINE RIGHT  02/10/2024   IR US  GUIDE VASC ACCESS LEFT  02/13/2024   IR US  GUIDE VASC ACCESS RIGHT  02/10/2024   REDUCTION MAMMAPLASTY Bilateral 03/21/2017   TRANSESOPHAGEAL ECHOCARDIOGRAM (CATH LAB) N/A 10/16/2023   Procedure: TRANSESOPHAGEAL ECHOCARDIOGRAM;  Surgeon: Barbaraann Darryle Ned, MD;  Location: [facility] INVASIVE CV LAB;  Service: Cardiovascular;  Laterality: N/A;    Allergies: Patient has no known allergies.  Medications: Prior to Admission medications   Medication Sig Start Date End Date Taking? Authorizing Provider  acetaminophen  (TYLENOL ) 500 MG tablet Take 1,000 mg by mouth 2 (two) times daily as needed for moderate pain (pain score 4-6), fever or headache.   Yes [provider]  calcitRIOL  (ROCALTROL ) 0.5 MCG capsule Take 1.5 mcg by  mouth Every Tuesday,Thursday,and Saturday with dialysis. Prior to dialysis on Tuesday, Thursday, Saturdays only 10/03/23  Yes [provider]  calcium  acetate (PHOSLO ) 667 MG capsule Take 1,334 mg by mouth 3 (three) times daily with meals.   Yes [provider]  furosemide  (LASIX ) 40 MG tablet Take 80 mg by mouth daily. 11/15/23  Yes [provider]  midodrine (PROAMATINE) 5 MG tablet Take 5 mg by mouth 3 (three) times daily with  meals.   Yes [provider]  Vitamin D , Ergocalciferol , (DRISDOL ) 1.25 MG (50000 UNIT) CAPS capsule Take 1 capsule (50,000 Units total) by mouth every Thursday. 07/07/22  Yes Angiulli, Toribio PARAS, PA-C  metoprolol  tartrate (LOPRESSOR ) 25 MG tablet Take 1 tablet (25 mg total) by mouth 2 (two) times daily. Patient not taking: Reported on 02/09/2024 10/20/23 01/18/24  [place], Eric J, DO     Family History  Problem Relation Age of Onset   Diabetes Mother    Hypertension Mother    Thyroid  disease Mother    Kidney disease Maternal Grandmother    Diabetes Maternal Grandmother    Heart attack Other     Social History   Socioeconomic History   Marital status: Married    Spouse name: Not on file   Number of children: Not on file   Years of education: Not on file   Highest education level: Not on file  Occupational History   Not on file  Tobacco Use   Smoking status: Never   Smokeless tobacco: Never  Vaping Use   Vaping status: Never Used  Substance and Sexual Activity   Alcohol use: No   Drug use: No   Sexual activity: Yes    Birth control/protection: None  Other Topics Concern   Not on file  Social History Narrative   Not on file   Social Drivers of Health   Financial Resource Strain: Not on file  Food Insecurity: No Food Insecurity (02/08/2024)   Hunger Vital Sign    Worried About Running Out of Food in the Last Year: Never true    Ran Out of Food in the Last Year: Never true  Transportation Needs: No Transportation Needs (02/08/2024)   PRAPARE - Administrator, Civil Service (Medical): No    Lack of Transportation (Non-Medical): No  Physical Activity: Not on file  Stress: Not on file  Social Connections: Socially Integrated (10/11/2023)   Social Connection and Isolation Panel    Frequency of Communication with Friends and Family: More than three times a week    Frequency of Social Gatherings with Friends and Family: More than three times a week     Attends Religious Services: 1 to 4 times per year    Active Member of [employer] or Organizations: Yes    Attends Engineer, structural: More than 4 times per year    Marital Status: Married     Review of Systems: A 12 point ROS discussed and pertinent positives are indicated in the HPI above.  All other systems are negative.  Review of Systems  Constitutional:  Negative for chills, fatigue and fever.  Respiratory:  Negative for cough, shortness of breath and wheezing.   Gastrointestinal:  Negative for diarrhea, nausea and vomiting.  Neurological:  Negative for dizziness and headaches.  Psychiatric/Behavioral:  Negative for agitation, behavioral problems and confusion.     Vital Signs: BP (!) 81/55   Pulse (!) 112   Temp 98.1 F (36.7 C)   Resp 17  Ht 5' 5 (1.651 m)   Wt 167 lb 5.3 oz (75.9 kg) Comment: Taken via bed.  LMP 01/28/2024 (Approximate)   SpO2 100%   BMI 27.85 kg/m   Advance Care Plan: The advanced care place/surrogate decision maker was discussed at the time of visit and the patient did not wish to discuss or was not able to name a surrogate decision maker or provide an advance care plan.  Physical Exam Constitutional:      Appearance: She is well-developed.  HENT:     Head: Atraumatic.     Mouth/Throat:     Mouth: Mucous membranes are moist.  Cardiovascular:     Rate and Rhythm: Normal rate and regular rhythm.     Heart sounds: No murmur heard. Pulmonary:     Effort: Pulmonary effort is normal.     Breath sounds: Normal breath sounds.  Abdominal:     General: Bowel sounds are normal.     Palpations: Abdomen is soft.  Musculoskeletal:        General: Normal range of motion.  Skin:    General: Skin is warm.  Neurological:     Mental Status: She is alert and oriented to person, place, and time.  Psychiatric:        Mood and Affect: Mood normal.        Behavior: Behavior normal.     Imaging: IR AV DIALY SHUNT INTRO NEEDLE/INTRACATH INITIAL  W/PTA/IMG LEFT Result Date: 02/13/2024 INDICATION: End-stage renal disease, difficult cannulation and flow rates through the left upper arm AV graft EXAM: ULTRASOUND GUIDANCE FOR VASCULAR ACCESS LEFT UPPER ARM AV GRAFT SHUNTOGRAM 7 MM VENOUS ANGIOPLASTY MEDICATIONS: 1% LIDOCAINE  LOCAL ANESTHESIA/SEDATION: Moderate (conscious) sedation was employed during this procedure. A total of Versed  1.0 mg, 25 MG BENADRYL  and Fentanyl  50 mcg was administered intravenously by the radiology nurse. Total intra-service moderate Sedation Time: 16 minutes. The patient's level of consciousness and vital signs were monitored continuously by radiology nursing throughout the procedure under my direct supervision. FLUOROSCOPY: Radiation Exposure Index (as provided by the fluoroscopic device): 24 mGy Kerma COMPLICATIONS: None immediate. PROCEDURE: Informed written consent was obtained from the patient after a thorough discussion of the procedural risks, benefits and alternatives. All questions were addressed. Maximal Sterile Barrier Technique was utilized including caps, mask, sterile gowns, sterile gloves, sterile drape, hand hygiene and skin antiseptic. A timeout was performed prior to the initiation of the procedure. Previous imaging reviewed. Initial ultrasound Angiocath access performed of the left upper arm AV graft. Shuntogram performed. Shuntogram: Arterial anastomosis to the brachial artery just above the elbow is widely patent. Outflow upper arm straight graft is patent. Moderate venous anastomotic outflow stenosis estimated at approximately 50%. There is some retrograde flow in the brachial vein noted. Outflow brachial, axillary, subclavian and innominate veins are patent. SVC patent. No central stenosis or occlusion. 7 mm angioplasty: Bentson guidewire advanced across the outflow stenosis. Six French sheath inserted. Guidewire confirmed across the stenosis of the venous outflow. Prolonged 3 minute inflation performed with a 7  x 60 Lutonix balloon. Balloon was deflated and removed. Final shuntogram demonstrates improvement in the outflow venous anastomotic stenosis. Minimal luminal narrowing remains. There is brisk graft outflow into the central veins. Access removed. Hemostasis obtained with pursestring suture. No immediate complication. Patient tolerated the procedure well. IMPRESSION: Successful 7 mm angioplasty of the venous outflow anastomotic stenosis as above. ACCESS: This access remains amenable to future percutaneous interventions as clinically indicated. Electronically Signed   By: CHRISTELLA.  Shick M.D.   On: 02/13/2024 11:50   IR US  Guide Vasc Access Left Result Date: 02/13/2024 INDICATION: End-stage renal disease, difficult cannulation and flow rates through the left upper arm AV graft EXAM: ULTRASOUND GUIDANCE FOR VASCULAR ACCESS LEFT UPPER ARM AV GRAFT SHUNTOGRAM 7 MM VENOUS ANGIOPLASTY MEDICATIONS: 1% LIDOCAINE  LOCAL ANESTHESIA/SEDATION: Moderate (conscious) sedation was employed during this procedure. A total of Versed  1.0 mg, 25 MG BENADRYL  and Fentanyl  50 mcg was administered intravenously by the radiology nurse. Total intra-service moderate Sedation Time: 16 minutes. The patient's level of consciousness and vital signs were monitored continuously by radiology nursing throughout the procedure under my direct supervision. FLUOROSCOPY: Radiation Exposure Index (as provided by the fluoroscopic device): 24 mGy Kerma COMPLICATIONS: None immediate. PROCEDURE: Informed written consent was obtained from the patient after a thorough discussion of the procedural risks, benefits and alternatives. All questions were addressed. Maximal Sterile Barrier Technique was utilized including caps, mask, sterile gowns, sterile gloves, sterile drape, hand hygiene and skin antiseptic. A timeout was performed prior to the initiation of the procedure. Previous imaging reviewed. Initial ultrasound Angiocath access performed of the left upper arm AV  graft. Shuntogram performed. Shuntogram: Arterial anastomosis to the brachial artery just above the elbow is widely patent. Outflow upper arm straight graft is patent. Moderate venous anastomotic outflow stenosis estimated at approximately 50%. There is some retrograde flow in the brachial vein noted. Outflow brachial, axillary, subclavian and innominate veins are patent. SVC patent. No central stenosis or occlusion. 7 mm angioplasty: Bentson guidewire advanced across the outflow stenosis. Six French sheath inserted. Guidewire confirmed across the stenosis of the venous outflow. Prolonged 3 minute inflation performed with a 7 x 60 Lutonix balloon. Balloon was deflated and removed. Final shuntogram demonstrates improvement in the outflow venous anastomotic stenosis. Minimal luminal narrowing remains. There is brisk graft outflow into the central veins. Access removed. Hemostasis obtained with pursestring suture. No immediate complication. Patient tolerated the procedure well. IMPRESSION: Successful 7 mm angioplasty of the venous outflow anastomotic stenosis as above. ACCESS: This access remains amenable to future percutaneous interventions as clinically indicated. Electronically Signed   By: CHRISTELLA.  Shick M.D.   On: 02/13/2024 11:50   MR FOOT RIGHT WO CONTRAST Result Date: 02/11/2024 CLINICAL DATA:  Soft tissue infection EXAM: MRI OF THE RIGHT FOOT WITHOUT CONTRAST TECHNIQUE: Multiplanar, multisequence MR imaging of the right foot from the heel through the Lisfranc joint was performed. No intravenous contrast was administered. COMPARISON:  Radiograph 02/08/2024 FINDINGS: Bones/Joint/Cartilage Extensive Charcot arthropathy involving the Chopart joint, midfoot, and Lisfranc joint with associated collapse of the longitudinal arch of the foot, fragmentation and deformity of bony structures, and substantial marrow edema in some regional bony structures including the talar head and neck, collapsed/fragmented navicular,  collapsed/fragmented lateral cuneiform, and middle cuneiform and lesser edema along the end ostium of adjacent structures such as the cuboid. Extensive associated spurring noted. Moderate malalignment along components of the Lisfranc joint. While osteomyelitis is difficult to completely exclude particularly in the regions of most severe marrow edema, Charcot arthropathy is favored. There may be an erosive component, for example along the lateral talar process on image 9 of series 9. Suspected lateral hindfoot impingement also on this image. Ligaments Likely partial tearing of the Lisfranc ligament on image 24 series 5. Indistinct anterior talofibular ligament likewise degenerated or chronically torn. Ill definition of components of the spring ligament. Muscles and Tendons Regional muscular atrophy. Soft tissues 3.7 by 0.8 by 4.8 cm (volume = 7 cm^3) fluid collection  with moderately thick marginal wall noted along the plantar foot below the medial band of the plantar fascia and corresponding to the region of density and presumed callus on radiography and inspection. There is some linear low signal intensity within this collection on image 33 series 5 which could represent the foreign body appreciated on radiography, although measuring longer than that foreign body at 1.5 cm. Questionable tear in the underlying medial band of the plantar fascia for example on image 12 series 8. Part of this is directly below the inferiorly protruding cuboid. IMPRESSION: 1. Extensive Charcot arthropathy involving the Chopart joint, midfoot, and Lisfranc joint with associated collapse of the longitudinal arch of the foot, fragmentation and deformity of bony structures, and substantial marrow edema in some regional bony structures. While osteomyelitis is difficult to completely exclude particularly in the regions of most severe marrow edema, Charcot arthropathy is favored. 2. 7 cc fluid collection with moderately thick marginal wall  along the plantar foot below the medial band of the plantar fascia and corresponding to the region of density and presumed callus on radiography and inspection. There is some linear low signal intensity within this collection which could represent the foreign body appreciated on radiography, although measuring longer than that foreign body at 1.5 cm. Questionable tear in the underlying medial band of the plantar fascia. 3. Regional muscular atrophy. 4. Likely partial tearing of the Lisfranc ligament. Indistinct anterior talofibular ligament likewise degenerated or chronically torn. Ill definition of components of the spring ligament. Electronically Signed   By: Ryan Salvage M.D.   On: 02/11/2024 11:11   IR Fluoro Guide CV Line Right Result Date: 02/10/2024 CLINICAL DATA:  End-stage renal disease and malfunctioning dialysis fistula. Need for temporary non tunneled dialysis catheter for hemodialysis. EXAM: NON-TUNNELED CENTRAL VENOUS HEMODIALYSIS CATHETER PLACEMENT WITH ULTRASOUND AND FLUOROSCOPIC GUIDANCE FLUOROSCOPY: Radiation Exposure Index: 1.0 mGy Kerma PROCEDURE: The procedure, risks, benefits, and alternatives were explained to the patient. Questions regarding the procedure were encouraged and answered. The patient understands and consents to the procedure. A time out was performed prior to initiating the procedure. Ultrasound was used to confirm patency of the right internal jugular vein. An ultrasound image was saved and recorded. The right neck and chest were prepped with chlorhexidine  in a sterile fashion, and a sterile drape was applied covering the operative field. Maximum barrier sterile technique with sterile gowns and gloves were used for the procedure. Local anesthesia was provided with 1% lidocaine . After creating a small venotomy incision, a 21 gauge needle was advanced into the right internal jugular vein under direct, real-time ultrasound guidance. Ultrasound image documentation was  performed. After securing guidewire access,the venotomy was dilated over a guide wire. A 16 cm, 13 Fr, triple lumen non-tunneled dialysis catheter was advanced over the wire. Final catheter positioning was confirmed and documented with a fluoroscopic spot image. The catheter was aspirated, flushed with saline, and injected with appropriate volume heparin  dwells. The catheter exit site was secured with 0-Prolene retention sutures. COMPLICATIONS: None.  No pneumothorax. FINDINGS: After catheter placement, the tip lies in the right atrium. The catheter aspirates normally and is ready for immediate use. IMPRESSION: Placement of non-tunneled central venous hemodialysis catheter via the right internal jugular vein. The catheter tip lies in the right atrium. The catheter is ready for immediate use. Electronically Signed   By: Marcey Moan M.D.   On: 02/10/2024 11:17   IR US  Guide Vasc Access Right Result Date: 02/10/2024 CLINICAL DATA:  End-stage renal disease and  malfunctioning dialysis fistula. Need for temporary non tunneled dialysis catheter for hemodialysis. EXAM: NON-TUNNELED CENTRAL VENOUS HEMODIALYSIS CATHETER PLACEMENT WITH ULTRASOUND AND FLUOROSCOPIC GUIDANCE FLUOROSCOPY: Radiation Exposure Index: 1.0 mGy Kerma PROCEDURE: The procedure, risks, benefits, and alternatives were explained to the patient. Questions regarding the procedure were encouraged and answered. The patient understands and consents to the procedure. A time out was performed prior to initiating the procedure. Ultrasound was used to confirm patency of the right internal jugular vein. An ultrasound image was saved and recorded. The right neck and chest were prepped with chlorhexidine  in a sterile fashion, and a sterile drape was applied covering the operative field. Maximum barrier sterile technique with sterile gowns and gloves were used for the procedure. Local anesthesia was provided with 1% lidocaine . After creating a small venotomy  incision, a 21 gauge needle was advanced into the right internal jugular vein under direct, real-time ultrasound guidance. Ultrasound image documentation was performed. After securing guidewire access,the venotomy was dilated over a guide wire. A 16 cm, 13 Fr, triple lumen non-tunneled dialysis catheter was advanced over the wire. Final catheter positioning was confirmed and documented with a fluoroscopic spot image. The catheter was aspirated, flushed with saline, and injected with appropriate volume heparin  dwells. The catheter exit site was secured with 0-Prolene retention sutures. COMPLICATIONS: None.  No pneumothorax. FINDINGS: After catheter placement, the tip lies in the right atrium. The catheter aspirates normally and is ready for immediate use. IMPRESSION: Placement of non-tunneled central venous hemodialysis catheter via the right internal jugular vein. The catheter tip lies in the right atrium. The catheter is ready for immediate use. Electronically Signed   By: Marcey Moan M.D.   On: 02/10/2024 11:17   ECHOCARDIOGRAM COMPLETE Result Date: 02/09/2024    ECHOCARDIOGRAM REPORT   Patient Name:   Jacqueline [name] Date of Exam: 02/09/2024 Medical Rec #:  [id number]    Height:       65.0 in Accession #:    [id number]   Weight:       168.0 lb Date of Birth:  Dec 04, 1980    BSA:          1.837 m Patient Age:    43 years     BP:           109/75 mmHg Patient Gender: F            HR:           85 bpm. Exam Location:  Inpatient Procedure: 2D Echo (Both Spectral and Color Flow Doppler were utilized during            procedure). Indications:    chest pain  History:        Patient has prior history of Echocardiogram examinations, most                 recent 10/16/2023. End stage renal disease; Risk Factors:Sleep                 Apnea, Hypertension, Dyslipidemia and Diabetes.  Sonographer:    Tinnie Barefoot RDCS Referring Phys: [id number] KARDIE TOBB IMPRESSIONS  1. Moderate LVOT gradient of that increased to  with valsalva. Left ventricular ejection fraction, by estimation, is >75%. The left ventricle has hyperdynamic function. The left ventricle has no regional wall motion abnormalities. There is moderate left ventricular hypertrophy. Left ventricular diastolic parameters are consistent with Grade I diastolic dysfunction (impaired relaxation).  2. Right ventricular systolic function is normal. The right ventricular size is  normal.  3. Mild chordal SAM noted. The mitral valve is normal in structure. Mild mitral valve regurgitation. No evidence of mitral stenosis.  4. The aortic valve is normal in structure. Aortic valve regurgitation is not visualized. No aortic stenosis is present.  5. The inferior vena cava is normal in size with greater than 50% respiratory variability, suggesting right atrial pressure of 3 mmHg. FINDINGS  Left Ventricle: Moderate LVOT gradient of that increased to with valsalva. Left ventricular ejection fraction, by estimation, is >75%. The left ventricle has hyperdynamic function. The left ventricle has no regional wall motion abnormalities. The left ventricular internal cavity size was normal in size. There is moderate left ventricular hypertrophy. Left ventricular diastolic parameters are consistent with Grade I diastolic dysfunction (impaired relaxation). Right Ventricle: The right ventricular size is normal. No increase in right ventricular wall thickness. Right ventricular systolic function is normal. Left Atrium: Left atrial size was normal in size. Right Atrium: Right atrial size was normal in size. Pericardium: There is no evidence of pericardial effusion. Mitral Valve: Mild chordal SAM noted. The mitral valve is normal in structure. Mild mitral valve regurgitation. No evidence of mitral valve stenosis. Tricuspid Valve: The tricuspid valve is normal in structure. Tricuspid valve regurgitation is not demonstrated. No evidence of tricuspid stenosis. Aortic Valve: The  aortic valve is normal in structure. Aortic valve regurgitation is not visualized. No aortic stenosis is present. Pulmonic Valve: The pulmonic valve was normal in structure. Pulmonic valve regurgitation is not visualized. No evidence of pulmonic stenosis. Aorta: The aortic root is normal in size and structure. Venous: The inferior vena cava is normal in size with greater than 50% respiratory variability, suggesting right atrial pressure of 3 mmHg. IAS/Shunts: No atrial level shunt detected by color flow Doppler.  LEFT VENTRICLE PLAX 2D LVIDd:         4.30 cm   Diastology LVIDs:         2.10 cm   LV e' medial:    6.42 cm/s LV PW:         1.00 cm   LV E/e' medial:  12.0 LV IVS:        1.20 cm   LV e' lateral:   8.81 cm/s LVOT diam:     1.80 cm   LV E/e' lateral: 8.8 LV SV:         51 LV SV Index:   28 LVOT Area:     2.54 cm  RIGHT VENTRICLE             IVC RV Basal diam:  2.40 cm     IVC diam: 1.60 cm RV S prime:     15.00 cm/s TAPSE (M-mode): 1.9 cm LEFT ATRIUM           Index        RIGHT ATRIUM           Index LA diam:      3.50 cm 1.91 cm/m   RA Area:     13.90 cm LA Vol (A4C): 37.8 ml 20.58 ml/m  RA Volume:   32.10 ml  17.48 ml/m  AORTIC VALVE LVOT Vmax:   105.00 cm/s LVOT Vmean:  74.100 cm/s LVOT VTI:    0.200 m  AORTA Ao Root diam: 3.00 cm Ao Asc diam:  3.00 cm MITRAL VALVE MV Area (PHT): 3.03 cm    SHUNTS MV Decel Time: 250 msec    Systemic VTI:  0.20 m MV E velocity: 77.10  cm/s  Systemic Diam: 1.80 cm MV A velocity: 79.60 cm/s MV E/A ratio:  0.97 Morene Brownie Electronically signed by Morene Brownie Signature Date/Time: 02/09/2024/5:00:00 PM    Final    NM Pulmonary Perfusion Result Date: 02/09/2024 CLINICAL DATA:  Chest pain, nonspecific EXAM: NUCLEAR MEDICINE PERFUSION LUNG SCAN TECHNIQUE: Perfusion images were obtained in multiple projections after intravenous injection of radiopharmaceutical. Ventilation scans intentionally deferred if perfusion scan and chest x-ray adequate for interpretation  RADIOPHARMACEUTICALS:  4.4 mCi Tc-39m MAA IV COMPARISON:  Chest radiograph February 08, 2024., pulmonary perfusion scan August 17, 2020 FINDINGS: No suspicious perfusion abnormality or filling defect identified. Normal distribution of radiotracer activity throughout both lungs. IMPRESSION: Normal perfusion exam. Electronically Signed   By: Duwaine Severs M.D.   On: 02/09/2024 14:58   DG Foot Complete Right Result Date: 02/08/2024 CLINICAL DATA:  Right-sided foot pain EXAM: DG FOOT COMPLETE 3+V*R* COMPARISON:  06/18/2022 FINDINGS: Vascular calcifications. No acute fracture is seen. Chronic deformities of the metatarsals. Advanced irregular arthropathy of the midfoot, progressive as compared with 2023. Chronic linear 6 mm foreign body within the plantar soft tissues of the midfoot. Interval protuberant soft tissue swelling along the plantar aspect of the midfoot. No soft tissue gas. IMPRESSION: 1. No acute osseous abnormality. 2. 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 Chronic linear foreign body within the plantar soft tissues of the midfoot. Interval protuberant soft tissue swelling along the plantar aspect of the midfoot suggesting an ulcer or soft tissue mass, correlate with direct inspection. Electronically Signed   By: Luke Bun M.D.   On: 02/08/2024 20:01   DG Chest 2 View Result Date: 02/08/2024 CLINICAL DATA:  Left chest pain, causing failure to complete dialysis today. Shortness of breath. EXAM: CHEST - 2 VIEW COMPARISON:  12/30/2023 FINDINGS: Old left rib deformities. Cardiac and mediastinal margins appear normal. Thoracic spondylosis. Potential mild airway thickening better seen in the right infrahilar region. No airspace opacity. No blunting of the costophrenic angles. IMPRESSION: 1. Potential mild airway thickening, better seen in the right infrahilar region. This could reflect bronchitis or reactive airways  disease. 2. Old left rib deformities. 3. Thoracic spondylosis. Electronically Signed   By: Ryan Salvage M.D.   On: 02/08/2024 09:44    Labs:  CBC: Recent Labs    02/11/24 1228 02/13/24 0315 02/14/24 0312 02/15/24 0252  WBC 6.2 6.1 5.9 6.1  HGB 10.4* 8.6* 8.9* 8.6*  HCT 33.0* 26.4* 27.2* 26.9*  PLT 190 160 155 173    COAGS: No results for input(s): INR, APTT in the last 8760 hours.  BMP: Recent Labs    02/12/24 0433 02/13/24 0315 02/14/24 0312 02/15/24 0252  NA 139 140 137 139  K 5.6* 5.2* 5.6* 4.5  CL 105 106 102 105  CO2 20* 19* 23 21*  GLUCOSE 88 119* 101* 110*  BUN 67* 71* 43* 52*  CALCIUM  7.2* 7.3* 7.6* 7.6*  CREATININE 10.05* 10.10* 6.45* 7.76*  GFRNONAA 4* 4* 8* 6*    LIVER FUNCTION TESTS: Recent Labs    12/30/23 0630 02/08/24 1932 02/09/24 0250 02/10/24 0252 02/11/24 1228 02/12/24 0433 02/14/24 0312 02/15/24 0252  BILITOT 0.9 0.8 1.1  --   --   --   --  0.6  AST 14* 29 33  --   --   --   --  50*  ALT 13 26 26   --   --   --   --  37  ALKPHOS 90 60 61  --   --   --   --  71  PROT 7.7 5.7* 6.1*  --   --   --   --  5.5*  ALBUMIN  3.6 2.8* 2.9*   < > 3.2* 2.9* 2.7* 2.5*   < > = values in this interval not displayed.    TUMOR MARKERS: No results for input(s): AFPTM, CEA, CA199, CHROMGRNA in the last 8760 hours.  Assessment and Plan:  Pt is with dysfunctioning AV graft; long term dialysis needs scheduled for image guided temporary to tunneled dialysis catheter placement 02/15/24.   Risks and benefits discussed with the patient including, but not limited to bleeding, infection, vascular injury, pneumothorax which may require chest tube placement, air embolism or even death  All of the patient's questions were answered, patient is agreeable to proceed. Consent signed and in chart.  Thank you for allowing our service to participate in [facility] 's care.  Electronically Signed: Lavanda JAYSON Jurist, PA-C   02/15/2024, 10:57  AM    I spent a total of 20 Minutes    in face to face in clinical consultation, greater than 50% of which was counseling/coordinating care for image guided tunneled dialysis catheter placement.

## 2024-02-15 NOTE — Discharge Instructions (Signed)
 Follow with Jacqueline Ezekiel NOVAK, MD in 5-7 days  Please get a complete blood count and chemistry panel checked by your Primary MD at your next visit, and again as instructed by your Primary MD. Please get your medications reviewed and adjusted by your Primary MD.  Please request your Primary MD to go over all Hospital Tests and Procedure/Radiological results at the follow up, please get all Hospital records sent to your Prim MD by signing hospital release before you go home.  In some cases, there will be blood work, cultures and biopsy results pending at the time of your discharge. Please request that your primary care M.D. goes through all the records of your hospital data and follows up on these results.  If you had Pneumonia of Lung problems at the Hospital: Please get a 2 view Chest X ray done in 6-8 weeks after hospital discharge or sooner if instructed by your Primary MD.  If you have Congestive Heart Failure: Please call your Cardiologist or Primary MD anytime you have any of the following symptoms:  1) 3 pound weight gain in 24 hours or 5 pounds in 1 week  2) shortness of breath, with or without a dry hacking cough  3) swelling in the hands, feet or stomach  4) if you have to sleep on extra pillows at night in order to breathe  Follow cardiac low salt diet and 1.5 lit/day fluid restriction.  If you have diabetes Accuchecks 4 times/day, Once in AM empty stomach and then before each meal. Log in all results and show them to your primary doctor at your next visit. If any glucose reading is under 80 or above 300 call your primary MD immediately.  If you have Seizure/Convulsions/Epilepsy: Please do not drive, operate heavy machinery, participate in activities at heights or participate in high speed sports until you have seen by Primary MD or a Neurologist and advised to do so again. Per [place] statutes, patients with seizures are not allowed to drive until they have been  seizure-free for six months.  Use caution when using heavy equipment or power tools. Avoid working on ladders or at heights. Take showers instead of baths. Ensure the water  temperature is not too high on the home water  heater. Do not go swimming alone. Do not lock yourself in a room alone (i.e. bathroom). When caring for infants or small children, sit down when holding, feeding, or changing them to minimize risk of injury to the child in the event you have a seizure. Maintain good sleep hygiene. Avoid alcohol.   If you had Gastrointestinal Bleeding: Please ask your Primary MD to check a complete blood count within one week of discharge or at your next visit. Your endoscopic/colonoscopic biopsies that are pending at the time of discharge, will also need to followed by your Primary MD.  Get Medicines reviewed and adjusted. Please take all your medications with you for your next visit with your Primary MD  Please request your Primary MD to go over all hospital tests and procedure/radiological results at the follow up, please ask your Primary MD to get all Hospital records sent to his/her office.  If you experience worsening of your admission symptoms, develop shortness of breath, life threatening emergency, suicidal or homicidal thoughts you must seek medical attention immediately by calling 911 or calling your MD immediately  if symptoms less severe.  You must read complete instructions/literature along with all the possible adverse reactions/side effects for all the Medicines you  take and that have been prescribed to you. Take any new Medicines after you have completely understood and accpet all the possible adverse reactions/side effects.   Do not drive or operate heavy machinery when taking Pain medications.   Do not take more than prescribed Pain, Sleep and Anxiety Medications  Special Instructions: If you have smoked or chewed Tobacco  in the last 2 yrs please stop smoking, stop any regular  Alcohol  and or any Recreational drug use.  Wear Seat belts while driving.  Please note You were cared for by a hospitalist during your hospital stay. If you have any questions about your discharge medications or the care you received while you were in the hospital after you are discharged, you can call the unit and asked to speak with the hospitalist on call if the hospitalist that took care of you is not available. Once you are discharged, your primary care physician will handle any further medical issues. Please note that NO REFILLS for any discharge medications will be authorized once you are discharged, as it is imperative that you return to your primary care physician (or establish a relationship with a primary care physician if you do not have one) for your aftercare needs so that they can reassess your need for medications and monitor your lab values.  You can reach the hospitalist office at phone [phone] or fax [phone]   If you do not have a primary care physician, you can call [phone] for a physician referral.  Activity: As tolerated with Full fall precautions use walker/cane & assistance as needed    Diet: renal  Disposition Home

## 2024-02-15 NOTE — Discharge Summary (Signed)
 Physician Discharge Summary  Jacqueline [name] FMW:[id number] DOB: 01/26/1981 DOA: 02/08/2024  PCP: Jacqueline Ezekiel NOVAK, MD  Admit date: 02/08/2024 Discharge date: 02/15/2024  Admitted From: home Disposition:  home  Recommendations for Outpatient Follow-up:  Follow up with PCP in 1-2 weeks  Home Health: none Equipment/Devices: none  Discharge Condition: stable CODE STATUS: Full code Diet Orders (From admission, onward)     Start     Ordered   02/15/24 1244  Diet NPO time specified Except for: Sips with Meds  Diet effective now       Question:  Except for  Answer:  Jacqueline [name] with Meds   02/15/24 1244            Brief Narrative / Interim history: 43 y.o. female with a PMH significant for hypertension, bronchitis diabetes chronic kidney disease, necrotizing fasciitis.  Came to emergency department with complaints of left-sided sharp chest pain worse with deep inspiration.  On day of admission she had presented to hemodialysis but they were unable to dialyze her today using her AV graft on her left arm.  Patient has history graft stenosis and clots in the past with recannulation most recently April 2025 of this year.  Patient also reports that she has had pain in the sole of her right foot that has been progressively getting worse.  She has no sensation in her right leg as she had a history of necrotizing fasciitis and now has developed a large callus-like area that is severe and deep.  Patient states that bearing weight on it is made it very difficult as is walking.  No complaints of shortness of breath or cough.  Hospital Course / Discharge diagnoses: Principal Problem:   Chest pain Active Problems:   Demand ischemia (HCC)   Sleep apnea in adult   Benign hypertension with ESRD (end-stage renal disease) (HCC)   Chronic diastolic heart failure (HCC)   Charcot's arthropathy associated with type 2 diabetes mellitus (HCC)   Principal problem Chest pain -patient was admitted to the hospital  with chest pain, described mostly as pleuritic.  It comes and goes, overall improved.  Cardiology consulted and followed patient while hospitalized, life-threatening conditions have been ruled out including ACS, PE.   Active problems End-stage renal disease on hemodialysis - History of AV graft malfunction with IR fistulogram and balloon angioplasty in April 2025.  IR has been consulted for suspected AV graft malfunction--> AV graft shuntogram performed by IR 7/29. Following that, could not undergo dialysis as her AV graft was not able to be cannulated x 2, and temporary HD line had to be used. Eventually had tunneled cath placed by IR to be used as an outpatient, and will see vascular surgery to evaluate the AV graft in clinic. Hyperkalemia - on HD Right foot pain - Large callus sole of the right foot. MRI shows extensive Charcot arthropathy as well as 7 cc fluid collection along plantar fascia and possible foreign body.  Partial tearing of the Lisfranc ligament.  Orthopedic surgery consulted, discussed with Dr. Harden, will be followed as an outpatient, does not need antibiotics or surgery right now Essential hypertension -  All antihypertensives have been stopped on admission secondary to hypotension concerns. Cardiology has started Cardizem  120 mg. Tolerating well Diabetes mellitus type 2 - Per patient diabetes has resolved Chronic diastolic congestive heart failure -euvolemic  Sepsis ruled out   Discharge Instructions   Allergies as of 02/15/2024   No Known Allergies      Medication List  STOP taking these medications    metoprolol  tartrate 25 MG tablet Commonly known as: LOPRESSOR        TAKE these medications    acetaminophen  500 MG tablet Commonly known as: TYLENOL  Take 1,000 mg by mouth 2 (two) times daily as needed for moderate pain (pain score 4-6), fever or headache.   calcitRIOL  0.5 MCG capsule Commonly known as: ROCALTROL  Take 1.5 mcg by mouth Every  Tuesday,Thursday,and Saturday with dialysis. Prior to dialysis on Tuesday, Thursday, Saturdays only   calcium  acetate 667 MG capsule Commonly known as: PHOSLO  Take 1,334 mg by mouth 3 (three) times daily with meals.   diltiazem  120 MG 24 hr capsule Commonly known as: CARDIZEM  CD Take 1 capsule (120 mg total) by mouth every morning. Start taking on: February 16, 2024   furosemide  40 MG tablet Commonly known as: LASIX  Take 80 mg by mouth daily.   midodrine 5 MG tablet Commonly known as: PROAMATINE Take 5 mg by mouth 3 (three) times daily with meals.   Vitamin D  (Ergocalciferol ) 1.25 MG (50000 UNIT) Caps capsule Commonly known as: DRISDOL  Take 1 capsule (50,000 Units total) by mouth every Thursday.        Follow-up Information     Jacqueline [name]., NP Follow up on 03/06/2024.   Specialty: Cardiology Why: 1:55PM. Cardiology post hospital follow up Contact information: [address] [place] [address] [phone]         Jacqueline Jerona GAILS, MD Follow up.   Specialty: Orthopedic Surgery Why: As needed Contact information: [address]  [place] [address] [phone]                 Consultations: Nephrology  IR  Procedures/Studies:  IR AV DIALY SHUNT INTRO NEEDLE/INTRACATH INITIAL W/PTA/IMG LEFT Result Date: 02/13/2024 INDICATION: End-stage renal disease, difficult cannulation and flow rates through the left upper arm AV graft EXAM: ULTRASOUND GUIDANCE FOR VASCULAR ACCESS LEFT UPPER ARM AV GRAFT SHUNTOGRAM 7 MM VENOUS ANGIOPLASTY MEDICATIONS: 1% LIDOCAINE  LOCAL ANESTHESIA/SEDATION: Moderate (conscious) sedation was employed during this procedure. A total of Versed  1.0 mg, 25 MG BENADRYL  and Fentanyl  50 mcg was administered intravenously by the radiology nurse. Total intra-service moderate Sedation Time: 16 minutes. The patient's level of consciousness and vital signs were monitored continuously by radiology nursing throughout the procedure under my  direct supervision. FLUOROSCOPY: Radiation Exposure Index (as provided by the fluoroscopic device): 24 mGy Kerma COMPLICATIONS: None immediate. PROCEDURE: Informed written consent was obtained from the patient after a thorough discussion of the procedural risks, benefits and alternatives. All questions were addressed. Maximal Sterile Barrier Technique was utilized including caps, mask, sterile gowns, sterile gloves, sterile drape, hand hygiene and skin antiseptic. A timeout was performed prior to the initiation of the procedure. Previous imaging reviewed. Initial ultrasound Angiocath access performed of the left upper arm AV graft. Shuntogram performed. Shuntogram: Arterial anastomosis to the brachial artery just above the elbow is widely patent. Outflow upper arm straight graft is patent. Moderate venous anastomotic outflow stenosis estimated at approximately 50%. There is some retrograde flow in the brachial vein noted. Outflow brachial, axillary, subclavian and innominate veins are patent. SVC patent. No central stenosis or occlusion. 7 mm angioplasty: Bentson guidewire advanced across the outflow stenosis. Six French sheath inserted. Guidewire confirmed across the stenosis of the venous outflow. Prolonged 3 minute inflation performed with a 7 x 60 Lutonix balloon. Balloon was deflated and removed. Final shuntogram demonstrates improvement in the outflow venous anastomotic stenosis. Minimal luminal narrowing remains. There is  brisk graft outflow into the central veins. Access removed. Hemostasis obtained with pursestring suture. No immediate complication. Patient tolerated the procedure well. IMPRESSION: Successful 7 mm angioplasty of the venous outflow anastomotic stenosis as above. ACCESS: This access remains amenable to future percutaneous interventions as clinically indicated. Electronically Signed   By: CHRISTELLA.  Shick M.D.   On: 02/13/2024 11:50   IR US  Guide Vasc Access Left Result Date:  02/13/2024 INDICATION: End-stage renal disease, difficult cannulation and flow rates through the left upper arm AV graft EXAM: ULTRASOUND GUIDANCE FOR VASCULAR ACCESS LEFT UPPER ARM AV GRAFT SHUNTOGRAM 7 MM VENOUS ANGIOPLASTY MEDICATIONS: 1% LIDOCAINE  LOCAL ANESTHESIA/SEDATION: Moderate (conscious) sedation was employed during this procedure. A total of Versed  1.0 mg, 25 MG BENADRYL  and Fentanyl  50 mcg was administered intravenously by the radiology nurse. Total intra-service moderate Sedation Time: 16 minutes. The patient's level of consciousness and vital signs were monitored continuously by radiology nursing throughout the procedure under my direct supervision. FLUOROSCOPY: Radiation Exposure Index (as provided by the fluoroscopic device): 24 mGy Kerma COMPLICATIONS: None immediate. PROCEDURE: Informed written consent was obtained from the patient after a thorough discussion of the procedural risks, benefits and alternatives. All questions were addressed. Maximal Sterile Barrier Technique was utilized including caps, mask, sterile gowns, sterile gloves, sterile drape, hand hygiene and skin antiseptic. A timeout was performed prior to the initiation of the procedure. Previous imaging reviewed. Initial ultrasound Angiocath access performed of the left upper arm AV graft. Shuntogram performed. Shuntogram: Arterial anastomosis to the brachial artery just above the elbow is widely patent. Outflow upper arm straight graft is patent. Moderate venous anastomotic outflow stenosis estimated at approximately 50%. There is some retrograde flow in the brachial vein noted. Outflow brachial, axillary, subclavian and innominate veins are patent. SVC patent. No central stenosis or occlusion. 7 mm angioplasty: Bentson guidewire advanced across the outflow stenosis. Six French sheath inserted. Guidewire confirmed across the stenosis of the venous outflow. Prolonged 3 minute inflation performed with a 7 x 60 Lutonix balloon. Balloon  was deflated and removed. Final shuntogram demonstrates improvement in the outflow venous anastomotic stenosis. Minimal luminal narrowing remains. There is brisk graft outflow into the central veins. Access removed. Hemostasis obtained with pursestring suture. No immediate complication. Patient tolerated the procedure well. IMPRESSION: Successful 7 mm angioplasty of the venous outflow anastomotic stenosis as above. ACCESS: This access remains amenable to future percutaneous interventions as clinically indicated. Electronically Signed   By: CHRISTELLA.  Shick M.D.   On: 02/13/2024 11:50   MR FOOT RIGHT WO CONTRAST Result Date: 02/11/2024 CLINICAL DATA:  Soft tissue infection EXAM: MRI OF THE RIGHT FOOT WITHOUT CONTRAST TECHNIQUE: Multiplanar, multisequence MR imaging of the right foot from the heel through the Lisfranc joint was performed. No intravenous contrast was administered. COMPARISON:  Radiograph 02/08/2024 FINDINGS: Bones/Joint/Cartilage Extensive Charcot arthropathy involving the Chopart joint, midfoot, and Lisfranc joint with associated collapse of the longitudinal arch of the foot, fragmentation and deformity of bony structures, and substantial marrow edema in some regional bony structures including the talar head and neck, collapsed/fragmented navicular, collapsed/fragmented lateral cuneiform, and middle cuneiform and lesser edema along the end ostium of adjacent structures such as the cuboid. Extensive associated spurring noted. Moderate malalignment along components of the Lisfranc joint. While osteomyelitis is difficult to completely exclude particularly in the regions of most severe marrow edema, Charcot arthropathy is favored. There may be an erosive component, for example along the lateral talar process on image 9 of series 9. Suspected lateral hindfoot impingement  also on this image. Ligaments Likely partial tearing of the Lisfranc ligament on image 24 series 5. Indistinct anterior talofibular ligament  likewise degenerated or chronically torn. Ill definition of components of the spring ligament. Muscles and Tendons Regional muscular atrophy. Soft tissues 3.7 by 0.8 by 4.8 cm (volume = 7 cm^3) fluid collection with moderately thick marginal wall noted along the plantar foot below the medial band of the plantar fascia and corresponding to the region of density and presumed callus on radiography and inspection. There is some linear low signal intensity within this collection on image 33 series 5 which could represent the foreign body appreciated on radiography, although measuring longer than that foreign body at 1.5 cm. Questionable tear in the underlying medial band of the plantar fascia for example on image 12 series 8. Part of this is directly below the inferiorly protruding cuboid. IMPRESSION: 1. Extensive Charcot arthropathy involving the Chopart joint, midfoot, and Lisfranc joint with associated collapse of the longitudinal arch of the foot, fragmentation and deformity of bony structures, and substantial marrow edema in some regional bony structures. While osteomyelitis is difficult to completely exclude particularly in the regions of most severe marrow edema, Charcot arthropathy is favored. 2. 7 cc fluid collection with moderately thick marginal wall along the plantar foot below the medial band of the plantar fascia and corresponding to the region of density and presumed callus on radiography and inspection. There is some linear low signal intensity within this collection which could represent the foreign body appreciated on radiography, although measuring longer than that foreign body at 1.5 cm. Questionable tear in the underlying medial band of the plantar fascia. 3. Regional muscular atrophy. 4. Likely partial tearing of the Lisfranc ligament. Indistinct anterior talofibular ligament likewise degenerated or chronically torn. Ill definition of components of the spring ligament. Electronically Signed   By:  Ryan Salvage M.D.   On: 02/11/2024 11:11   IR Fluoro Guide CV Line Right Result Date: 02/10/2024 CLINICAL DATA:  End-stage renal disease and malfunctioning dialysis fistula. Need for temporary non tunneled dialysis catheter for hemodialysis. EXAM: NON-TUNNELED CENTRAL VENOUS HEMODIALYSIS CATHETER PLACEMENT WITH ULTRASOUND AND FLUOROSCOPIC GUIDANCE FLUOROSCOPY: Radiation Exposure Index: 1.0 mGy Kerma PROCEDURE: The procedure, risks, benefits, and alternatives were explained to the patient. Questions regarding the procedure were encouraged and answered. The patient understands and consents to the procedure. A time out was performed prior to initiating the procedure. Ultrasound was used to confirm patency of the right internal jugular vein. An ultrasound image was saved and recorded. The right neck and chest were prepped with chlorhexidine  in a sterile fashion, and a sterile drape was applied covering the operative field. Maximum barrier sterile technique with sterile gowns and gloves were used for the procedure. Local anesthesia was provided with 1% lidocaine . After creating a small venotomy incision, a 21 gauge needle was advanced into the right internal jugular vein under direct, real-time ultrasound guidance. Ultrasound image documentation was performed. After securing guidewire access,the venotomy was dilated over a guide wire. A 16 cm, 13 Fr, triple lumen non-tunneled dialysis catheter was advanced over the wire. Final catheter positioning was confirmed and documented with a fluoroscopic spot image. The catheter was aspirated, flushed with saline, and injected with appropriate volume heparin  dwells. The catheter exit site was secured with 0-Prolene retention sutures. COMPLICATIONS: None.  No pneumothorax. FINDINGS: After catheter placement, the tip lies in the right atrium. The catheter aspirates normally and is ready for immediate use. IMPRESSION: Placement of non-tunneled central venous hemodialysis  catheter via the right internal jugular vein. The catheter tip lies in the right atrium. The catheter is ready for immediate use. Electronically Signed   By: Marcey Moan M.D.   On: 02/10/2024 11:17   IR US  Guide Vasc Access Right Result Date: 02/10/2024 CLINICAL DATA:  End-stage renal disease and malfunctioning dialysis fistula. Need for temporary non tunneled dialysis catheter for hemodialysis. EXAM: NON-TUNNELED CENTRAL VENOUS HEMODIALYSIS CATHETER PLACEMENT WITH ULTRASOUND AND FLUOROSCOPIC GUIDANCE FLUOROSCOPY: Radiation Exposure Index: 1.0 mGy Kerma PROCEDURE: The procedure, risks, benefits, and alternatives were explained to the patient. Questions regarding the procedure were encouraged and answered. The patient understands and consents to the procedure. A time out was performed prior to initiating the procedure. Ultrasound was used to confirm patency of the right internal jugular vein. An ultrasound image was saved and recorded. The right neck and chest were prepped with chlorhexidine  in a sterile fashion, and a sterile drape was applied covering the operative field. Maximum barrier sterile technique with sterile gowns and gloves were used for the procedure. Local anesthesia was provided with 1% lidocaine . After creating a small venotomy incision, a 21 gauge needle was advanced into the right internal jugular vein under direct, real-time ultrasound guidance. Ultrasound image documentation was performed. After securing guidewire access,the venotomy was dilated over a guide wire. A 16 cm, 13 Fr, triple lumen non-tunneled dialysis catheter was advanced over the wire. Final catheter positioning was confirmed and documented with a fluoroscopic spot image. The catheter was aspirated, flushed with saline, and injected with appropriate volume heparin  dwells. The catheter exit site was secured with 0-Prolene retention sutures. COMPLICATIONS: None.  No pneumothorax. FINDINGS: After catheter placement, the tip  lies in the right atrium. The catheter aspirates normally and is ready for immediate use. IMPRESSION: Placement of non-tunneled central venous hemodialysis catheter via the right internal jugular vein. The catheter tip lies in the right atrium. The catheter is ready for immediate use. Electronically Signed   By: Marcey Moan M.D.   On: 02/10/2024 11:17   ECHOCARDIOGRAM COMPLETE Result Date: 02/09/2024    ECHOCARDIOGRAM REPORT   Patient Name:   SHONYA SUMIDA Date of Exam: 02/09/2024 Medical Rec #:  [id number]    Height:       65.0 in Accession #:    [id number]   Weight:       168.0 lb Date of Birth:  1980/11/30    BSA:          1.837 m Patient Age:    43 years     BP:           109/75 mmHg Patient Gender: F            HR:           85 bpm. Exam Location:  Inpatient Procedure: 2D Echo (Both Spectral and Color Flow Doppler were utilized during            procedure). Indications:    chest pain  History:        Patient has prior history of Echocardiogram examinations, most                 recent 10/16/2023. End stage renal disease; Risk Factors:Sleep                 Apnea, Hypertension, Dyslipidemia and Diabetes.  Sonographer:    Tinnie Barefoot RDCS Referring Phys: [id number] KARDIE TOBB IMPRESSIONS  1. Moderate LVOT gradient of that increased to with valsalva. Left ventricular  ejection fraction, by estimation, is >75%. The left ventricle has hyperdynamic function. The left ventricle has no regional wall motion abnormalities. There is moderate left ventricular hypertrophy. Left ventricular diastolic parameters are consistent with Grade I diastolic dysfunction (impaired relaxation).  2. Right ventricular systolic function is normal. The right ventricular size is normal.  3. Mild chordal SAM noted. The mitral valve is normal in structure. Mild mitral valve regurgitation. No evidence of mitral stenosis.  4. The aortic valve is normal in structure. Aortic valve regurgitation is not visualized. No aortic  stenosis is present.  5. The inferior vena cava is normal in size with greater than 50% respiratory variability, suggesting right atrial pressure of 3 mmHg. FINDINGS  Left Ventricle: Moderate LVOT gradient of that increased to with valsalva. Left ventricular ejection fraction, by estimation, is >75%. The left ventricle has hyperdynamic function. The left ventricle has no regional wall motion abnormalities. The left ventricular internal cavity size was normal in size. There is moderate left ventricular hypertrophy. Left ventricular diastolic parameters are consistent with Grade I diastolic dysfunction (impaired relaxation). Right Ventricle: The right ventricular size is normal. No increase in right ventricular wall thickness. Right ventricular systolic function is normal. Left Atrium: Left atrial size was normal in size. Right Atrium: Right atrial size was normal in size. Pericardium: There is no evidence of pericardial effusion. Mitral Valve: Mild chordal SAM noted. The mitral valve is normal in structure. Mild mitral valve regurgitation. No evidence of mitral valve stenosis. Tricuspid Valve: The tricuspid valve is normal in structure. Tricuspid valve regurgitation is not demonstrated. No evidence of tricuspid stenosis. Aortic Valve: The aortic valve is normal in structure. Aortic valve regurgitation is not visualized. No aortic stenosis is present. Pulmonic Valve: The pulmonic valve was normal in structure. Pulmonic valve regurgitation is not visualized. No evidence of pulmonic stenosis. Aorta: The aortic root is normal in size and structure. Venous: The inferior vena cava is normal in size with greater than 50% respiratory variability, suggesting right atrial pressure of 3 mmHg. IAS/Shunts: No atrial level shunt detected by color flow Doppler.  LEFT VENTRICLE PLAX 2D LVIDd:         4.30 cm   Diastology LVIDs:         2.10 cm   LV e' medial:    6.42 cm/s LV PW:         1.00 cm   LV E/e' medial:  12.0  LV IVS:        1.20 cm   LV e' lateral:   8.81 cm/s LVOT diam:     1.80 cm   LV E/e' lateral: 8.8 LV SV:         51 LV SV Index:   28 LVOT Area:     2.54 cm  RIGHT VENTRICLE             IVC RV Basal diam:  2.40 cm     IVC diam: 1.60 cm RV S prime:     15.00 cm/s TAPSE (M-mode): 1.9 cm LEFT ATRIUM           Index        RIGHT ATRIUM           Index LA diam:      3.50 cm 1.91 cm/m   RA Area:     13.90 cm LA Vol (A4C): 37.8 ml 20.58 ml/m  RA Volume:   32.10 ml  17.48 ml/m  AORTIC VALVE LVOT Vmax:   105.00  cm/s LVOT Vmean:  74.100 cm/s LVOT VTI:    0.200 m  AORTA Ao Root diam: 3.00 cm Ao Asc diam:  3.00 cm MITRAL VALVE MV Area (PHT): 3.03 cm    SHUNTS MV Decel Time: 250 msec    Systemic VTI:  0.20 m MV E velocity: 77.10 cm/s  Systemic Diam: 1.80 cm MV A velocity: 79.60 cm/s MV E/A ratio:  0.97 Morene Brownie Electronically signed by Morene Brownie Signature Date/Time: 02/09/2024/5:00:00 PM    Final    NM Pulmonary Perfusion Result Date: 02/09/2024 CLINICAL DATA:  Chest pain, nonspecific EXAM: NUCLEAR MEDICINE PERFUSION LUNG SCAN TECHNIQUE: Perfusion images were obtained in multiple projections after intravenous injection of radiopharmaceutical. Ventilation scans intentionally deferred if perfusion scan and chest x-ray adequate for interpretation RADIOPHARMACEUTICALS:  4.4 mCi Tc-48m MAA IV COMPARISON:  Chest radiograph February 08, 2024., pulmonary perfusion scan August 17, 2020 FINDINGS: No suspicious perfusion abnormality or filling defect identified. Normal distribution of radiotracer activity throughout both lungs. IMPRESSION: Normal perfusion exam. Electronically Signed   By: Duwaine Severs M.D.   On: 02/09/2024 14:58   DG Foot Complete Right Result Date: 02/08/2024 CLINICAL DATA:  Right-sided foot pain EXAM: DG FOOT COMPLETE 3+V*R* COMPARISON:  06/18/2022 FINDINGS: Vascular calcifications. No acute fracture is seen. Chronic deformities of the metatarsals. Advanced irregular arthropathy of the midfoot,  progressive as compared with 2023. Chronic linear 6 mm foreign body within the plantar soft tissues of the midfoot. Interval protuberant soft tissue swelling along the plantar aspect of the midfoot. No soft tissue gas. IMPRESSION: 1. No acute osseous abnormality. 2. 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 Chronic linear foreign body within the plantar soft tissues of the midfoot. Interval protuberant soft tissue swelling along the plantar aspect of the midfoot suggesting an ulcer or soft tissue mass, correlate with direct inspection. Electronically Signed   By: Luke Bun M.D.   On: 02/08/2024 20:01   DG Chest 2 View Result Date: 02/08/2024 CLINICAL DATA:  Left chest pain, causing failure to complete dialysis today. Shortness of breath. EXAM: CHEST - 2 VIEW COMPARISON:  12/30/2023 FINDINGS: Old left rib deformities. Cardiac and mediastinal margins appear normal. Thoracic spondylosis. Potential mild airway thickening better seen in the right infrahilar region. No airspace opacity. No blunting of the costophrenic angles. IMPRESSION: 1. Potential mild airway thickening, better seen in the right infrahilar region. This could reflect bronchitis or reactive airways disease. 2. Old left rib deformities. 3. Thoracic spondylosis. Electronically Signed   By: Ryan Salvage M.D.   On: 02/08/2024 09:44     Subjective: - no chest pain, shortness of breath, no abdominal pain, nausea or vomiting.   Discharge Exam: BP 132/68 (BP Location: Right Arm)   Pulse 98   Temp 98.1 F (36.7 C)   Resp 15   Ht 5' 5 (1.651 m)   Wt 74.2 kg Comment: Taken via bed.  LMP 01/28/2024 (Approximate)   SpO2 100%   BMI 27.22 kg/m   General: Pt is alert, awake, not in acute distress Cardiovascular: RRR, S1/S2 +, no rubs, no gallops Respiratory: CTA bilaterally, no wheezing, no rhonchi Abdominal: Soft, NT, ND, bowel sounds + Extremities:  no edema, no cyanosis    The results of significant diagnostics from this hospitalization (including imaging, microbiology, ancillary and laboratory) are listed below for reference.     Microbiology: No results found for this or any previous visit (from the past 240 hours).  Labs: Basic Metabolic Panel: Recent Labs  Lab 02/09/24 0250 02/10/24 0252 02/11/24 1228 02/12/24 0433 02/13/24 0315 02/14/24 0312 02/15/24 0252  NA 139 139 138 139 140 137 139  K 4.3 4.8 5.0 5.6* 5.2* 5.6* 4.5  CL 101 102 104 105 106 102 105  CO2 20* 20* 18* 20* 19* 23 21*  GLUCOSE 102* 103* 122* 88 119* 101* 110*  BUN 62* 66* 63* 67* 71* 43* 52*  CREATININE 9.13* 10.22* 9.62* 10.05* 10.10* 6.45* 7.76*  CALCIUM  6.9* 6.9* 7.2* 7.2* 7.3* 7.6* 7.6*  MG 2.3  --   --   --   --   --  2.2  PHOS 8.9* 10.4* 8.8* 9.5*  --  6.9*  --    Liver Function Tests: Recent Labs  Lab 02/08/24 1932 02/09/24 0250 02/10/24 0252 02/11/24 1228 02/12/24 0433 02/14/24 0312 02/15/24 0252  AST 29 33  --   --   --   --  50*  ALT 26 26  --   --   --   --  37  ALKPHOS 60 61  --   --   --   --  71  BILITOT 0.8 1.1  --   --   --   --  0.6  PROT 5.7* 6.1*  --   --   --   --  5.5*  ALBUMIN  2.8* 2.9* 2.8* 3.2* 2.9* 2.7* 2.5*   CBC: Recent Labs  Lab 02/09/24 0250 02/10/24 0252 02/11/24 1228 02/13/24 0315 02/14/24 0312 02/15/24 0252  WBC 6.4 5.4 6.2 6.1 5.9 6.1  NEUTROABS 3.0  --   --   --   --   --   HGB 9.8* 9.7* 10.4* 8.6* 8.9* 8.6*  HCT 31.4* 30.3* 33.0* 26.4* 27.2* 26.9*  MCV 101.6* 98.4 98.5 96.7 96.5 97.5  PLT 126* 211 190 160 155 173   CBG: No results for input(s): GLUCAP in the last 168 hours. Hgb A1c No results for input(s): HGBA1C in the last 72 hours. Lipid Profile No results for input(s): CHOL, HDL, LDLCALC, TRIG, CHOLHDL, LDLDIRECT in the last 72 hours. Thyroid  function studies No results for input(s): TSH, T4TOTAL, T3FREE, THYROIDAB in the last 72 hours.  Invalid input(s):  FREET3 Urinalysis    Component Value Date/Time   COLORURINE YELLOW 10/13/2023 1728   APPEARANCEUR HAZY (A) 10/13/2023 1728   LABSPEC 1.014 10/13/2023 1728   PHURINE 7.0 10/13/2023 1728   GLUCOSEU 150 (A) 10/13/2023 1728   HGBUR SMALL (A) 10/13/2023 1728   BILIRUBINUR NEGATIVE 10/13/2023 1728   KETONESUR NEGATIVE 10/13/2023 1728   PROTEINUR >=300 (A) 10/13/2023 1728   UROBILINOGEN 0.2 08/21/2012 2130   NITRITE NEGATIVE 10/13/2023 1728   LEUKOCYTESUR MODERATE (A) 10/13/2023 1728    FURTHER DISCHARGE INSTRUCTIONS:   Get Medicines reviewed and adjusted: Please take all your medications with you for your next visit with your Primary MD   Laboratory/radiological data: Please request your Primary MD to go over all hospital tests and procedure/radiological results at the follow up, please ask your Primary MD to get all Hospital records sent to his/her office.   In some cases, they will be blood work, cultures and biopsy results pending at the time of your discharge. Please request that your primary care M.D. goes through all the records of your hospital data and follows up on these results.   Also Note the following: If you experience worsening of your admission symptoms, develop shortness of breath, life threatening emergency, suicidal or homicidal thoughts you  must seek medical attention immediately by calling 911 or calling your MD immediately  if symptoms less severe.   You must read complete instructions/literature along with all the possible adverse reactions/side effects for all the Medicines you take and that have been prescribed to you. Take any new Medicines after you have completely understood and accpet all the possible adverse reactions/side effects.    Do not drive when taking Pain medications or sleeping medications (Benzodaizepines)   Do not take more than prescribed Pain, Sleep and Anxiety Medications. It is not advisable to combine anxiety,sleep and pain medications  without talking with your primary care practitioner   Special Instructions: If you have smoked or chewed Tobacco  in the last 2 yrs please stop smoking, stop any regular Alcohol  and or any Recreational drug use.   Wear Seat belts while driving.   Please note: You were cared for by a hospitalist during your hospital stay. Once you are discharged, your primary care physician will handle any further medical issues. Please note that NO REFILLS for any discharge medications will be authorized once you are discharged, as it is imperative that you return to your primary care physician (or establish a relationship with a primary care physician if you do not have one) for your post hospital discharge needs so that they can reassess your need for medications and monitor your lab values.  Time coordinating discharge: 35 minutes  SIGNED:  Nilda Fendt, MD, PhD 02/15/2024, 4:45 PM

## 2024-02-15 NOTE — Discharge Planning (Signed)
 [facility] Patient Discharge Orders- [facility] CLINIC: [place]  Patient's name: Jacqueline [name] Admit/DC Dates: 02/08/2024 - 02/15/2024  Discharge Diagnoses: Chest pain - W/u (-)  AVG malfunction-see below  Aranesp : Given: No     Last Hgb: 8.6 PRBC's Given: No  ESA dose for discharge: mircera 100 mcg IV q 2 weeks  IV Iron dose at discharge: Resume weekly Fe  Heparin  change: No  EDW Change: No  Bath Change: No  Access intervention/Change: Yes  Details:   Hectorol change: No  Discharge Labs: Calcium  7.6  Phosphorus 6.9  Albumin  2.5  K+ 4.5  IV Antibiotics: No   Change with Vascular Access: Yes Details: AV graft malfunction. Unable to cannulate patient in HD. IR made aware and she had a temp catheter placed 02/10/24. Temp cath not working. No TDC placed but had shuntogram.  S/p TDC placement 7/31 in IR.  On Coumadin?: No   OTHER/APPTS/LAB ORDERS:    D/C Meds to be reconciled by nurse after every discharge.  Completed By: Charmaine Piety, NP   Reviewed by: MD:______ RN_______

## 2024-02-15 NOTE — Procedures (Signed)
 Interventional Radiology Procedure Note  Procedure: Tunneled hemodialysis catheter placement   Findings: Please refer to procedural dictation for full description. 19 cm right internal jugular tunneled HD catheter, via established access.  Complications: None immediate  Estimated Blood Loss: < 5 mL  Recommendations: Catheter ready for immediate use.   Ester Sides, MD

## 2024-02-16 ENCOUNTER — Telehealth (HOSPITAL_COMMUNITY): Payer: Self-pay | Admitting: Nephrology

## 2024-02-16 ENCOUNTER — Other Ambulatory Visit: Payer: Self-pay | Admitting: Family Medicine

## 2024-02-16 NOTE — Progress Notes (Signed)
 Late Note Entry- February 16, 2024  Pt was d/c late yesterday. Contacted [facility] this morning to be advised of pt's d/c date and that pt should resume care tomorrow.   Randine Mungo Dialysis Navigator [phone]

## 2024-02-16 NOTE — Progress Notes (Unsigned)
 {  Select_TRH_Note:[id number]}

## 2024-02-16 NOTE — Telephone Encounter (Signed)
 Transition of Care - Initial Contact after Hospitalization  Date of discharge: 02/15/2024 Date of contact: 02/16/24  Method: Phone Spoke to: Patient  Patient contacted to discuss transition of care from recent inpatient hospitalization. Patient was admitted to [facility] from 7/24-7/31/25 with chest pain and AVG dysfunction.  The discharge medication list was reviewed. Patient understands the changes and has no concerns.   Patient will return to his/her outpatient HD unit on: tomorrow. has TDC now.  No other concerns at this time, chest is a little sore from [facility] placement - explain that is expected for a few days - should improve with time.  Izetta Boehringer, PA-C [employer] Pager [phone]

## 2024-02-19 DIAGNOSIS — N2581 Secondary hyperparathyroidism of renal origin: Secondary | ICD-10-CM | POA: Diagnosis not present

## 2024-02-19 DIAGNOSIS — Z992 Dependence on renal dialysis: Secondary | ICD-10-CM | POA: Diagnosis not present

## 2024-02-19 DIAGNOSIS — D631 Anemia in chronic kidney disease: Secondary | ICD-10-CM | POA: Diagnosis not present

## 2024-02-19 DIAGNOSIS — N186 End stage renal disease: Secondary | ICD-10-CM | POA: Diagnosis not present

## 2024-02-20 DIAGNOSIS — N2581 Secondary hyperparathyroidism of renal origin: Secondary | ICD-10-CM | POA: Diagnosis not present

## 2024-02-20 DIAGNOSIS — N186 End stage renal disease: Secondary | ICD-10-CM | POA: Diagnosis not present

## 2024-02-20 DIAGNOSIS — D631 Anemia in chronic kidney disease: Secondary | ICD-10-CM | POA: Diagnosis not present

## 2024-02-20 DIAGNOSIS — Z992 Dependence on renal dialysis: Secondary | ICD-10-CM | POA: Diagnosis not present

## 2024-02-22 DIAGNOSIS — R079 Chest pain, unspecified: Secondary | ICD-10-CM | POA: Diagnosis not present

## 2024-02-22 DIAGNOSIS — N2581 Secondary hyperparathyroidism of renal origin: Secondary | ICD-10-CM | POA: Diagnosis not present

## 2024-02-22 DIAGNOSIS — D631 Anemia in chronic kidney disease: Secondary | ICD-10-CM | POA: Diagnosis not present

## 2024-02-22 DIAGNOSIS — I120 Hypertensive chronic kidney disease with stage 5 chronic kidney disease or end stage renal disease: Secondary | ICD-10-CM | POA: Diagnosis not present

## 2024-02-22 DIAGNOSIS — Z992 Dependence on renal dialysis: Secondary | ICD-10-CM | POA: Diagnosis not present

## 2024-02-22 DIAGNOSIS — N186 End stage renal disease: Secondary | ICD-10-CM | POA: Diagnosis not present

## 2024-02-24 DIAGNOSIS — Z992 Dependence on renal dialysis: Secondary | ICD-10-CM | POA: Diagnosis not present

## 2024-02-24 DIAGNOSIS — D631 Anemia in chronic kidney disease: Secondary | ICD-10-CM | POA: Diagnosis not present

## 2024-02-24 DIAGNOSIS — N186 End stage renal disease: Secondary | ICD-10-CM | POA: Diagnosis not present

## 2024-02-24 DIAGNOSIS — N2581 Secondary hyperparathyroidism of renal origin: Secondary | ICD-10-CM | POA: Diagnosis not present

## 2024-02-27 DIAGNOSIS — N2581 Secondary hyperparathyroidism of renal origin: Secondary | ICD-10-CM | POA: Diagnosis not present

## 2024-02-27 DIAGNOSIS — D631 Anemia in chronic kidney disease: Secondary | ICD-10-CM | POA: Diagnosis not present

## 2024-02-27 DIAGNOSIS — N186 End stage renal disease: Secondary | ICD-10-CM | POA: Diagnosis not present

## 2024-02-27 DIAGNOSIS — Z992 Dependence on renal dialysis: Secondary | ICD-10-CM | POA: Diagnosis not present

## 2024-02-29 DIAGNOSIS — D631 Anemia in chronic kidney disease: Secondary | ICD-10-CM | POA: Diagnosis not present

## 2024-02-29 DIAGNOSIS — N186 End stage renal disease: Secondary | ICD-10-CM | POA: Diagnosis not present

## 2024-02-29 DIAGNOSIS — Z992 Dependence on renal dialysis: Secondary | ICD-10-CM | POA: Diagnosis not present

## 2024-02-29 DIAGNOSIS — N2581 Secondary hyperparathyroidism of renal origin: Secondary | ICD-10-CM | POA: Diagnosis not present

## 2024-03-04 ENCOUNTER — Other Ambulatory Visit: Payer: Self-pay | Admitting: Family Medicine

## 2024-03-04 DIAGNOSIS — Z1231 Encounter for screening mammogram for malignant neoplasm of breast: Secondary | ICD-10-CM

## 2024-03-05 DIAGNOSIS — E1122 Type 2 diabetes mellitus with diabetic chronic kidney disease: Secondary | ICD-10-CM | POA: Diagnosis not present

## 2024-03-05 DIAGNOSIS — Z992 Dependence on renal dialysis: Secondary | ICD-10-CM | POA: Diagnosis not present

## 2024-03-05 DIAGNOSIS — N2581 Secondary hyperparathyroidism of renal origin: Secondary | ICD-10-CM | POA: Diagnosis not present

## 2024-03-05 DIAGNOSIS — Z23 Encounter for immunization: Secondary | ICD-10-CM | POA: Diagnosis not present

## 2024-03-05 DIAGNOSIS — N186 End stage renal disease: Secondary | ICD-10-CM | POA: Diagnosis not present

## 2024-03-05 DIAGNOSIS — D631 Anemia in chronic kidney disease: Secondary | ICD-10-CM | POA: Diagnosis not present

## 2024-03-05 NOTE — Progress Notes (Deleted)
 Cardiology Office Note    Patient Name: Jacqueline [name] Date of Encounter: 03/05/2024  Primary Care Provider:  [name], MD Primary Cardiologist:  Lynwood Schilling, MD Primary Electrophysiologist: None   Past Medical History    Past Medical History:  Diagnosis Date   Anemia    Anxiety    Chronic bronchitis (HCC)    Chronic kidney disease    Diabetes (HCC) 01/01/2014   Hypertension    Increased frequency of headaches    Morbid obesity (HCC)    Necrotizing fasciitis (HCC)    Sleep apnea    does not use CPAP - - gastric bypass   Type II diabetes mellitus (HCC)    no meds, diet controlled    History of Present Illness  Jacqueline [name] is a 43 y.o. female with a PMH of diastolic CHF, DM type II, morbid obesity s/p gastric bypass 07/2018, CKD stage III, necrotizing fasciitis, OSA, ESRD who presents today for posthospital follow-up.  Jacqueline [name] was seen initially in 12/23/2015 as an inpatient for complaint of chest pain.  She previously underwent a stress test in [place]  2 years prior that was unremarkable.  She was arranged for outpatient stress test and started on PPI but this has not helped her chest pain.  She presented back to the ED with ongoing chest pain on 01/06/2016.  She underwent a left heart cath which was normal with no evidence of CAD.  She was admitted on 07/02/2018 for acute on chronic CHF and was treated with IV Lasix  with diuresis of 5 L.  She was also found to be anemic and underwent 1 unit of PRBCs.  She completed bariatric surgery on 08/06/2018 with gastric bypass.  She underwent AV graft placement in 2024 due to progression of chronic kidney disease.  She was admitted on 10/11/2023 due to chest pain with nausea vomiting, loose stools and was diagnosed with bacteremia.  She was noted to have progressive CKD to ESRD underwent a TEE that was negative for infectious source of bacteremia.  She was seen in the ED on 02/04/2024 with complaint of chest pain.  She reported  discomfort as sharp underneath the left breast.  She completed a chest x-ray that showed no evidence of pneumonia and troponins were found to be elevated.  She had recently traveled on vacation and concern for PE was noted.  She underwent VQ scan to rule out PE and was evaluated by cardiology.  She was found to have negative VQ scan for PE and also ruled out for ACS.  She completed 2D echo that showed EF of 75% with moderate LVH with moderate LVOT gradient of 36 mm of increased to 40 mm with Valsalva and grade 1 DD with mild MR.  She was started on Cardizem  120 mg due to hyperdynamic EF   Patient denies chest pain, palpitations, dyspnea, PND, orthopnea, nausea, vomiting, dizziness, syncope, edema, weight gain, or early satiety.   Discussed the use of AI scribe software for clinical note transcription with the patient, who gave verbal consent to proceed.  History of Present Illness    ***Notes: Workup for HCM referral to Dr. Arne Colt   Review of Systems  Please see the history of present illness.    All other systems reviewed and are otherwise negative except as noted above.  Physical Exam    Wt Readings from Last 3 Encounters:  02/15/24 163 lb 9.3 oz (74.2 kg)  12/30/23 160 lb 11.5 oz (72.9 kg)  11/29/23 172  lb (78 kg)   CD:Uyzmz were no vitals filed for this visit.,There is no height or weight on file to calculate BMI. GEN: Well nourished, well developed in no acute distress Neck: No JVD; No carotid bruits Pulmonary: Clear to auscultation without rales, wheezing or rhonchi  Cardiovascular: Normal rate. Regular rhythm. Normal S1. Normal S2.   Murmurs: There is no murmur.  ABDOMEN: Soft, non-tender, non-distended EXTREMITIES:  No edema; No deformity   EKG/LABS/ Recent Cardiac Studies   ECG personally reviewed by me today - ***  Risk Assessment/Calculations:   {Does this patient have ATRIAL FIBRILLATION?[phone]}      Lab Results  Component Value Date   WBC 6.1  02/15/2024   HGB 8.6 (L) 02/15/2024   HCT 26.9 (L) 02/15/2024   MCV 97.5 02/15/2024   PLT 173 02/15/2024   Lab Results  Component Value Date   CREATININE 7.76 (H) 02/15/2024   BUN 52 (H) 02/15/2024   NA 139 02/15/2024   K 4.5 02/15/2024   CL 105 02/15/2024   CO2 21 (L) 02/15/2024   Lab Results  Component Value Date   CHOL 86 02/09/2024   HDL 38 (L) 02/09/2024   LDLCALC 41 02/09/2024   TRIG 33 02/09/2024   CHOLHDL 2.3 02/09/2024    Lab Results  Component Value Date   HGBA1C 4.3 (L) 02/09/2024   Assessment & Plan    Assessment and Plan Assessment & Plan     1.  Chronic diastolic CHF  2.  Chest pain  3.  History of ESRD  4.  DM type II  5.  Hyperlipidemia      Disposition: Follow-up with Lynwood Schilling, MD or APP in *** months {Are you ordering a CV Procedure (e.g. stress test, cath, DCCV, TEE, etc)?   Press F2        :[id number]}   Signed, Wyn Raddle, Jackee Shove, NP 03/05/2024, 12:42 PM [facility]

## 2024-03-06 ENCOUNTER — Ambulatory Visit: Attending: Cardiology | Admitting: Nurse Practitioner

## 2024-03-06 DIAGNOSIS — I120 Hypertensive chronic kidney disease with stage 5 chronic kidney disease or end stage renal disease: Secondary | ICD-10-CM

## 2024-03-06 DIAGNOSIS — I10 Essential (primary) hypertension: Secondary | ICD-10-CM

## 2024-03-06 DIAGNOSIS — E1169 Type 2 diabetes mellitus with other specified complication: Secondary | ICD-10-CM

## 2024-03-06 DIAGNOSIS — I5032 Chronic diastolic (congestive) heart failure: Secondary | ICD-10-CM

## 2024-03-07 DIAGNOSIS — N186 End stage renal disease: Secondary | ICD-10-CM | POA: Diagnosis not present

## 2024-03-07 DIAGNOSIS — D631 Anemia in chronic kidney disease: Secondary | ICD-10-CM | POA: Diagnosis not present

## 2024-03-07 DIAGNOSIS — Z23 Encounter for immunization: Secondary | ICD-10-CM | POA: Diagnosis not present

## 2024-03-07 DIAGNOSIS — N2581 Secondary hyperparathyroidism of renal origin: Secondary | ICD-10-CM | POA: Diagnosis not present

## 2024-03-07 DIAGNOSIS — Z992 Dependence on renal dialysis: Secondary | ICD-10-CM | POA: Diagnosis not present

## 2024-03-07 DIAGNOSIS — E1122 Type 2 diabetes mellitus with diabetic chronic kidney disease: Secondary | ICD-10-CM | POA: Diagnosis not present

## 2024-03-09 DIAGNOSIS — N186 End stage renal disease: Secondary | ICD-10-CM | POA: Diagnosis not present

## 2024-03-09 DIAGNOSIS — D631 Anemia in chronic kidney disease: Secondary | ICD-10-CM | POA: Diagnosis not present

## 2024-03-09 DIAGNOSIS — N2581 Secondary hyperparathyroidism of renal origin: Secondary | ICD-10-CM | POA: Diagnosis not present

## 2024-03-09 DIAGNOSIS — Z23 Encounter for immunization: Secondary | ICD-10-CM | POA: Diagnosis not present

## 2024-03-09 DIAGNOSIS — E1122 Type 2 diabetes mellitus with diabetic chronic kidney disease: Secondary | ICD-10-CM | POA: Diagnosis not present

## 2024-03-09 DIAGNOSIS — Z992 Dependence on renal dialysis: Secondary | ICD-10-CM | POA: Diagnosis not present

## 2024-03-11 ENCOUNTER — Encounter: Payer: Self-pay | Admitting: Orthopedic Surgery

## 2024-03-11 ENCOUNTER — Ambulatory Visit: Admitting: Orthopedic Surgery

## 2024-03-11 DIAGNOSIS — Z01818 Encounter for other preprocedural examination: Secondary | ICD-10-CM | POA: Diagnosis not present

## 2024-03-11 DIAGNOSIS — E11610 Type 2 diabetes mellitus with diabetic neuropathic arthropathy: Secondary | ICD-10-CM | POA: Diagnosis not present

## 2024-03-11 DIAGNOSIS — N186 End stage renal disease: Secondary | ICD-10-CM | POA: Diagnosis not present

## 2024-03-12 ENCOUNTER — Encounter: Payer: Self-pay | Admitting: Orthopedic Surgery

## 2024-03-12 DIAGNOSIS — Z992 Dependence on renal dialysis: Secondary | ICD-10-CM | POA: Diagnosis not present

## 2024-03-12 DIAGNOSIS — N186 End stage renal disease: Secondary | ICD-10-CM | POA: Diagnosis not present

## 2024-03-12 DIAGNOSIS — N2581 Secondary hyperparathyroidism of renal origin: Secondary | ICD-10-CM | POA: Diagnosis not present

## 2024-03-12 DIAGNOSIS — D631 Anemia in chronic kidney disease: Secondary | ICD-10-CM | POA: Diagnosis not present

## 2024-03-12 NOTE — Progress Notes (Addendum)
 Office Visit Note   Patient: Jacqueline [name]           Date of Birth: 11/08/1980           MRN: [id number] Visit Date: 03/11/2024              Requested by: [name], MD [address] [place],  [place] [address] PCP: [name], MD  Chief Complaint  Patient presents with   Right Foot - Pain      HPI: Patient is a 43 year old woman who is seen for evaluation for Charcot collapse of the right foot with hypertrophic calluses on both feet secondary to the Charcot collapse.  Patient is status post an MRI scan.  Assessment & Plan: Visit Diagnoses:  1. Charcot foot due to diabetes mellitus (HCC)     Plan: Patient was provided a prescription for [employer] for custom multidensity orthotics for the Charcot collapse.  Follow-Up Instructions: Return if symptoms worsen or fail to improve.   Ortho Exam  Patient is alert, oriented, no adenopathy, well-dressed, normal affect, normal respiratory effort. Examination patient has a good dorsalis pedis pulse bilaterally she has a Charcot collapse of the right foot compared to the left.  There is hypertrophic callus on the plantar aspect of both feet and after informed consent a 10 blade knife was used to debride the callus bilaterally.  There is no open wounds no cellulitis no fluctuance no signs of infection.  The callus on the right Charcot collapse after debridement is 5 x 4 cm and the callus on the left beneath the great toe and the base of the fifth metatarsal approximately 1 cm in diameter.  Patient's Charcot collapse was due to diabetic neuropathy.  Current hemoglobin A1c is 4.3.    Imaging: No results found. No images are attached to the encounter.  Labs: Lab Results  Component Value Date   HGBA1C 4.3 (L) 02/09/2024   HGBA1C 4.4 (L) 10/12/2023   HGBA1C 5.4 06/21/2022   ESRSEDRATE 22 02/09/2024   ESRSEDRATE 93 (H) 05/04/2018   CRP 0.6 02/09/2024   CRP 7.3 (H) 08/17/2020   CRP 0.6 07/07/2019   REPTSTATUS  10/20/2023 FINAL 10/15/2023   REPTSTATUS 10/20/2023 FINAL 10/15/2023   GRAMSTAIN  06/18/2022    RARE WBC PRESENT, PREDOMINANTLY PMN FEW GRAM POSITIVE COCCI FEW GRAM NEGATIVE RODS Performed at [facility], [address] [place], [place] [address]    CULT  10/15/2023    NO GROWTH 5 DAYS Performed at [facility], [address] [place], [place] [address]    CULT  10/15/2023    NO GROWTH 5 DAYS Performed at [facility], [address] [place], [place] [address]    [facility] STREPTOCOCCUS MUTANS 10/13/2023     Lab Results  Component Value Date   ALBUMIN  2.5 (L) 02/15/2024   ALBUMIN  2.7 (L) 02/14/2024   ALBUMIN  2.9 (L) 02/12/2024   PREALBUMIN 15.4 (L) 08/18/2020    Lab Results  Component Value Date   MG 2.2 02/15/2024   MG 2.3 02/09/2024   MG 1.9 10/11/2023   Lab Results  Component Value Date   VD25OH 31.40 10/11/2023    Lab Results  Component Value Date   PREALBUMIN 15.4 (L) 08/18/2020      Latest Ref Rng & Units 02/15/2024    2:52 AM 02/14/2024    3:12 AM 02/13/2024    3:15 AM  CBC EXTENDED  WBC 4.0 - 10.5 K/uL  6.1  5.9  6.1   RBC 3.87 - 5.11 MIL/uL 2.76  2.82  2.73   Hemoglobin 12.0 - 15.0 g/dL 8.6  8.9  8.6   HCT 63.9 - 46.0 % 26.9  27.2  26.4   Platelets 150 - 400 K/uL 173  155  160      There is no height or weight on file to calculate BMI.  Orders:  No orders of the defined types were placed in this encounter.  No orders of the defined types were placed in this encounter.    Procedures: No procedures performed  Clinical Data: No additional findings.  ROS:  All other systems negative, except as noted in the HPI. Review of Systems  Objective: Vital Signs: LMP 01/28/2024 (Approximate)   Specialty Comments:  No specialty comments available.  PMFS History: Patient Active Problem List   Diagnosis Date Noted   Charcot's arthropathy associated with type 2 diabetes mellitus (HCC) 02/14/2024   Demand ischemia (HCC)  02/10/2024   Sleep apnea in adult 02/10/2024   Benign hypertension with ESRD (end-stage renal disease) (HCC) 02/10/2024   Chronic diastolic heart failure (HCC) 02/10/2024   Hypocalcemia due to chronic kidney disease 10/11/2023   Cellulitis of right leg 08/30/2022   Adjustment disorder with depressed mood 07/01/2022   Debility 06/29/2022   Necrotizing fasciitis (HCC) 06/29/2022   Viral illness 08/17/2020   Elevated d-dimer 08/17/2020   Acute lower UTI 08/17/2020   Hypoalbuminemia 08/17/2020   Complicated migraine 12/12/2019   Right hemiplegia (HCC) 12/02/2019   Hypertension    AKI (acute kidney injury) (HCC)    Stage 5 chronic kidney disease not on chronic dialysis (HCC)    Obesity, Class III, BMI 40-49.9 (morbid obesity)    Anemia of chronic disease    HLD (hyperlipidemia) 07/02/2018   Acute on chronic diastolic CHF (congestive heart failure) (HCC) 07/02/2018   Depression 07/02/2018   OSA (obstructive sleep apnea) 07/02/2018   Acute on chronic diastolic (congestive) heart failure (HCC) 07/02/2018   Cellulitis of right lower extremity 07/02/2018   Anemia 07/02/2018   Chest pain 05/04/2018   Hypertensive urgency 05/03/2018   Left facial numbness 05/03/2018   Headache 05/03/2018   Elevated troponin 05/03/2018   Hyperlipidemia associated with type 2 diabetes mellitus (HCC) 03/08/2018   Hypertension associated with stage 3 chronic kidney disease due to type 2 diabetes mellitus (HCC) 03/08/2018   Vitamin D  deficiency 12/21/2017   Generalized anxiety disorder 08/07/2017   Diabetic peripheral neuropathy (HCC) 06/21/2017   History of asthma 06/21/2017   Steatosis of liver 06/21/2017   Microalbuminuric diabetic nephropathy (HCC) 06/01/2017   Raised TSH level 06/01/2017   Thrombocytosis 06/01/2017   Allergic rhinitis 05/30/2017   PAF (paroxysmal atrial fibrillation) (HCC) 05/25/2017   Sepsis (HCC) 05/03/2017    Class: Present on Admission   Acute renal failure superimposed on stage  3 chronic kidney disease (HCC) 05/03/2017    Class: Stage 3   Neck pain    Acidosis, metabolic    Hyponatremia    Normocytic anemia 04/30/2017   Staphylococcus aureus bacteremia 03/15/2016   Streptococcal bacteremia 03/15/2016   Tachycardia 03/14/2016   Fever 03/14/2016   Bad headache 03/14/2016   Past Medical History:  Diagnosis Date   Anemia    Anxiety    Chronic bronchitis (HCC)    Chronic kidney disease    Diabetes (HCC) 01/01/2014   Hypertension    Increased frequency of headaches    Morbid obesity (HCC)    Necrotizing fasciitis (  HCC)    Sleep apnea    does not use CPAP - - gastric bypass   Type II diabetes mellitus (HCC)    no meds, diet controlled    Family History  Problem Relation Age of Onset   Diabetes Mother    Hypertension Mother    Thyroid  disease Mother    Kidney disease Maternal Grandmother    Diabetes Maternal Grandmother    Heart attack Other     Past Surgical History:  Procedure Laterality Date   A/V SHUNT INTERVENTION N/A 11/17/2023   Procedure: A/V SHUNT INTERVENTION;  Surgeon: Tobie Gordy POUR, MD;  [facility] INVASIVE CV LAB;  Service: Cardiovascular;  Laterality: N/A;  80% venous anastomosis   AV FISTULA PLACEMENT Left 05/02/2023   Procedure: LEFT ARM ARTERIOVENOUS (AV) FISTULA  GRAFT;  Surgeon: Pearline Norman RAMAN, MD;  Location: [facility] OR;  Service: Vascular;  Laterality: Left;   BARIATRIC SURGERY     BREAST REDUCTION SURGERY  03/21/2017   CARDIAC CATHETERIZATION  01/06/2016   CARDIAC CATHETERIZATION N/A 01/06/2016   Procedure: Left Heart Cath and Coronary Angiography;  Surgeon: Deatrice DELENA Cage, MD;  Location: [facility] INVASIVE CV LAB;  Service: Cardiovascular;  Laterality: N/A;   CESAREAN SECTION  08/2014   I & D EXTREMITY Right 06/18/2022   Procedure: IRRIGATION AND DEBRIDEMENT RIGHT ABDOMEN AND THIGH;  Surgeon: Kinsinger, Herlene Righter, MD;  Location: [facility] OR;  Service: General;  Laterality: Right;   I & D EXTREMITY Right 06/20/2022   Procedure: WOUND  EXPLORATION OF RIGHT THIGH AND RIGHT GROIN WITH IRRIGATION AND DEBRIDEMENT;  Surgeon: Curvin Deward MOULD, MD;  Location: [facility] OR;  Service: General;  Laterality: Right;   IR AV DIALY SHUNT INTRO NEEDLE/INTRACATH INITIAL W/PTA/IMG LEFT  10/19/2023   IR AV DIALY SHUNT INTRO NEEDLE/INTRACATH INITIAL W/PTA/IMG LEFT  02/13/2024   IR DIALY SHUNT INTRO NEEDLE/INTRACATH INITIAL W/IMG LEFT Left 11/29/2023   IR FLUORO GUIDE CV LINE RIGHT  02/10/2024   IR FLUORO GUIDE CV LINE RIGHT  02/15/2024   IR US  GUIDE VASC ACCESS LEFT  02/13/2024   IR US  GUIDE VASC ACCESS RIGHT  02/10/2024   REDUCTION MAMMAPLASTY Bilateral 03/21/2017   TRANSESOPHAGEAL ECHOCARDIOGRAM (CATH LAB) N/A 10/16/2023   Procedure: TRANSESOPHAGEAL ECHOCARDIOGRAM;  Surgeon: Barbaraann Darryle Ned, MD;  Location: [facility] INVASIVE CV LAB;  Service: Cardiovascular;  Laterality: N/A;   Social History   Occupational History   Not on file  Tobacco Use   Smoking status: Never   Smokeless tobacco: Never  Vaping Use   Vaping status: Never Used  Substance and Sexual Activity   Alcohol use: No   Drug use: No   Sexual activity: Yes    Birth control/protection: None

## 2024-03-14 DIAGNOSIS — D631 Anemia in chronic kidney disease: Secondary | ICD-10-CM | POA: Diagnosis not present

## 2024-03-14 DIAGNOSIS — N186 End stage renal disease: Secondary | ICD-10-CM | POA: Diagnosis not present

## 2024-03-14 DIAGNOSIS — N2581 Secondary hyperparathyroidism of renal origin: Secondary | ICD-10-CM | POA: Diagnosis not present

## 2024-03-14 DIAGNOSIS — Z992 Dependence on renal dialysis: Secondary | ICD-10-CM | POA: Diagnosis not present

## 2024-03-16 DIAGNOSIS — N2581 Secondary hyperparathyroidism of renal origin: Secondary | ICD-10-CM | POA: Diagnosis not present

## 2024-03-16 DIAGNOSIS — D631 Anemia in chronic kidney disease: Secondary | ICD-10-CM | POA: Diagnosis not present

## 2024-03-16 DIAGNOSIS — N186 End stage renal disease: Secondary | ICD-10-CM | POA: Diagnosis not present

## 2024-03-16 DIAGNOSIS — Z992 Dependence on renal dialysis: Secondary | ICD-10-CM | POA: Diagnosis not present

## 2024-03-17 DIAGNOSIS — I120 Hypertensive chronic kidney disease with stage 5 chronic kidney disease or end stage renal disease: Secondary | ICD-10-CM | POA: Diagnosis not present

## 2024-03-17 DIAGNOSIS — Z992 Dependence on renal dialysis: Secondary | ICD-10-CM | POA: Diagnosis not present

## 2024-03-17 DIAGNOSIS — N186 End stage renal disease: Secondary | ICD-10-CM | POA: Diagnosis not present

## 2024-03-19 DIAGNOSIS — D631 Anemia in chronic kidney disease: Secondary | ICD-10-CM | POA: Diagnosis not present

## 2024-03-19 DIAGNOSIS — N186 End stage renal disease: Secondary | ICD-10-CM | POA: Diagnosis not present

## 2024-03-19 DIAGNOSIS — N2581 Secondary hyperparathyroidism of renal origin: Secondary | ICD-10-CM | POA: Diagnosis not present

## 2024-03-19 DIAGNOSIS — Z992 Dependence on renal dialysis: Secondary | ICD-10-CM | POA: Diagnosis not present

## 2024-03-21 DIAGNOSIS — D631 Anemia in chronic kidney disease: Secondary | ICD-10-CM | POA: Diagnosis not present

## 2024-03-21 DIAGNOSIS — Z992 Dependence on renal dialysis: Secondary | ICD-10-CM | POA: Diagnosis not present

## 2024-03-21 DIAGNOSIS — N186 End stage renal disease: Secondary | ICD-10-CM | POA: Diagnosis not present

## 2024-03-21 DIAGNOSIS — N2581 Secondary hyperparathyroidism of renal origin: Secondary | ICD-10-CM | POA: Diagnosis not present

## 2024-03-22 ENCOUNTER — Ambulatory Visit

## 2024-03-22 DIAGNOSIS — Z992 Dependence on renal dialysis: Secondary | ICD-10-CM | POA: Diagnosis not present

## 2024-03-22 DIAGNOSIS — Z1231 Encounter for screening mammogram for malignant neoplasm of breast: Secondary | ICD-10-CM | POA: Diagnosis not present

## 2024-03-22 DIAGNOSIS — N186 End stage renal disease: Secondary | ICD-10-CM | POA: Diagnosis not present

## 2024-03-22 DIAGNOSIS — R29898 Other symptoms and signs involving the musculoskeletal system: Secondary | ICD-10-CM | POA: Diagnosis not present

## 2024-03-22 DIAGNOSIS — F331 Major depressive disorder, recurrent, moderate: Secondary | ICD-10-CM | POA: Diagnosis not present

## 2024-03-22 DIAGNOSIS — F411 Generalized anxiety disorder: Secondary | ICD-10-CM | POA: Diagnosis not present

## 2024-03-22 DIAGNOSIS — G629 Polyneuropathy, unspecified: Secondary | ICD-10-CM | POA: Diagnosis not present

## 2024-03-22 DIAGNOSIS — I951 Orthostatic hypotension: Secondary | ICD-10-CM | POA: Diagnosis not present

## 2024-03-22 DIAGNOSIS — E559 Vitamin D deficiency, unspecified: Secondary | ICD-10-CM | POA: Diagnosis not present

## 2024-03-22 DIAGNOSIS — Z124 Encounter for screening for malignant neoplasm of cervix: Secondary | ICD-10-CM | POA: Diagnosis not present

## 2024-03-22 DIAGNOSIS — I10 Essential (primary) hypertension: Secondary | ICD-10-CM | POA: Diagnosis not present

## 2024-03-22 DIAGNOSIS — E782 Mixed hyperlipidemia: Secondary | ICD-10-CM | POA: Diagnosis not present

## 2024-03-23 DIAGNOSIS — N2581 Secondary hyperparathyroidism of renal origin: Secondary | ICD-10-CM | POA: Diagnosis not present

## 2024-03-23 DIAGNOSIS — N186 End stage renal disease: Secondary | ICD-10-CM | POA: Diagnosis not present

## 2024-03-23 DIAGNOSIS — Z992 Dependence on renal dialysis: Secondary | ICD-10-CM | POA: Diagnosis not present

## 2024-03-23 DIAGNOSIS — D631 Anemia in chronic kidney disease: Secondary | ICD-10-CM | POA: Diagnosis not present

## 2024-03-26 DIAGNOSIS — N2581 Secondary hyperparathyroidism of renal origin: Secondary | ICD-10-CM | POA: Diagnosis not present

## 2024-03-26 DIAGNOSIS — Z992 Dependence on renal dialysis: Secondary | ICD-10-CM | POA: Diagnosis not present

## 2024-03-26 DIAGNOSIS — N186 End stage renal disease: Secondary | ICD-10-CM | POA: Diagnosis not present

## 2024-03-26 DIAGNOSIS — D631 Anemia in chronic kidney disease: Secondary | ICD-10-CM | POA: Diagnosis not present

## 2024-03-28 DIAGNOSIS — D631 Anemia in chronic kidney disease: Secondary | ICD-10-CM | POA: Diagnosis not present

## 2024-03-28 DIAGNOSIS — N2581 Secondary hyperparathyroidism of renal origin: Secondary | ICD-10-CM | POA: Diagnosis not present

## 2024-03-28 DIAGNOSIS — Z992 Dependence on renal dialysis: Secondary | ICD-10-CM | POA: Diagnosis not present

## 2024-03-28 DIAGNOSIS — N186 End stage renal disease: Secondary | ICD-10-CM | POA: Diagnosis not present

## 2024-03-29 ENCOUNTER — Ambulatory Visit

## 2024-03-30 DIAGNOSIS — N2581 Secondary hyperparathyroidism of renal origin: Secondary | ICD-10-CM | POA: Diagnosis not present

## 2024-03-30 DIAGNOSIS — Z992 Dependence on renal dialysis: Secondary | ICD-10-CM | POA: Diagnosis not present

## 2024-03-30 DIAGNOSIS — N186 End stage renal disease: Secondary | ICD-10-CM | POA: Diagnosis not present

## 2024-03-30 DIAGNOSIS — D631 Anemia in chronic kidney disease: Secondary | ICD-10-CM | POA: Diagnosis not present

## 2024-04-10 ENCOUNTER — Other Ambulatory Visit: Payer: Self-pay

## 2024-04-10 DIAGNOSIS — N184 Chronic kidney disease, stage 4 (severe): Secondary | ICD-10-CM

## 2024-04-12 ENCOUNTER — Ambulatory Visit: Attending: Vascular Surgery | Admitting: Physician Assistant

## 2024-04-12 ENCOUNTER — Ambulatory Visit (HOSPITAL_COMMUNITY)
Admission: RE | Admit: 2024-04-12 | Discharge: 2024-04-12 | Disposition: A | Discharge status: Home / Self Care | Source: Ambulatory Visit | Attending: Vascular Surgery | Admitting: Vascular Surgery

## 2024-04-12 VITALS — BP 131/77 | HR 90 | Temp 97.9°F | Resp 16 | Ht 65.0 in | Wt 166.1 lb

## 2024-04-12 DIAGNOSIS — N184 Chronic kidney disease, stage 4 (severe): Secondary | ICD-10-CM | POA: Diagnosis not present

## 2024-04-12 DIAGNOSIS — Z992 Dependence on renal dialysis: Secondary | ICD-10-CM | POA: Diagnosis not present

## 2024-04-12 DIAGNOSIS — N186 End stage renal disease: Secondary | ICD-10-CM | POA: Insufficient documentation

## 2024-04-12 NOTE — Progress Notes (Signed)
 Office Note     CC:  follow up Requesting Provider:  Macel Jayson PARAS, MD  HPI: Jacqueline [name] is a 43 y.o. ([birth year]) female who presents for evaluation of left arm AV graft.  This was placed by Dr. Pearline in October 2024.  She underwent angioplasty of venous outflow and March and again in May.  She also required shuntogram at the end of July however this time the stenosis was in the graft itself.  Around this time she also had a right IJ TDC placed.  The dialysis center has not attempted HD via left arm AV graft since [facility] placement.  The patient states that there is 1 dialysis tech who never has any problems with cannulation of her graft.  She dialyzes on a Tuesday Thursday Saturday schedule at the [facility] location.   Past Medical History:  Diagnosis Date   Anemia    Anxiety    Chronic bronchitis (HCC)    Chronic kidney disease    Diabetes (HCC) 01/01/2014   Hypertension    Increased frequency of headaches    Morbid obesity (HCC)    Necrotizing fasciitis (HCC)    Sleep apnea    does not use CPAP - - gastric bypass   Type II diabetes mellitus (HCC)    no meds, diet controlled    Past Surgical History:  Procedure Laterality Date   A/V SHUNT INTERVENTION N/A 11/17/2023   Procedure: A/V SHUNT INTERVENTION;  Surgeon: Tobie Gordy POUR, MD;  Location: [facility] INVASIVE CV LAB;  Service: Cardiovascular;  Laterality: N/A;  80% venous anastomosis   AV FISTULA PLACEMENT Left 05/02/2023   Procedure: LEFT ARM ARTERIOVENOUS (AV) FISTULA  GRAFT;  Surgeon: Pearline Norman RAMAN, MD;  Location: [facility] OR;  Service: Vascular;  Laterality: Left;   BARIATRIC SURGERY     BREAST REDUCTION SURGERY  03/21/2017   CARDIAC CATHETERIZATION  01/06/2016   CARDIAC CATHETERIZATION N/A 01/06/2016   Procedure: Left Heart Cath and Coronary Angiography;  Surgeon: Deatrice DELENA Cage, MD;  Location: [facility] INVASIVE CV LAB;  Service: Cardiovascular;  Laterality: N/A;   CESAREAN SECTION  08/2014   I & D EXTREMITY Right 06/18/2022    Procedure: IRRIGATION AND DEBRIDEMENT RIGHT ABDOMEN AND THIGH;  Surgeon: Kinsinger, Herlene Righter, MD;  Location: [facility] OR;  Service: General;  Laterality: Right;   I & D EXTREMITY Right 06/20/2022   Procedure: WOUND EXPLORATION OF RIGHT THIGH AND RIGHT GROIN WITH IRRIGATION AND DEBRIDEMENT;  Surgeon: Curvin Deward MOULD, MD;  Location: [facility] OR;  Service: General;  Laterality: Right;   IR AV DIALY SHUNT INTRO NEEDLE/INTRACATH INITIAL W/PTA/IMG LEFT  10/19/2023   IR AV DIALY SHUNT INTRO NEEDLE/INTRACATH INITIAL W/PTA/IMG LEFT  02/13/2024   IR DIALY SHUNT INTRO NEEDLE/INTRACATH INITIAL W/IMG LEFT Left 11/29/2023   IR FLUORO GUIDE CV LINE RIGHT  02/10/2024   IR FLUORO GUIDE CV LINE RIGHT  02/15/2024   IR US  GUIDE VASC ACCESS LEFT  02/13/2024   IR US  GUIDE VASC ACCESS RIGHT  02/10/2024   REDUCTION MAMMAPLASTY Bilateral 03/21/2017   TRANSESOPHAGEAL ECHOCARDIOGRAM (CATH LAB) N/A 10/16/2023   Procedure: TRANSESOPHAGEAL ECHOCARDIOGRAM;  Surgeon: Barbaraann Darryle Ned, MD;  Location: [facility] INVASIVE CV LAB;  Service: Cardiovascular;  Laterality: N/A;    Social History   Socioeconomic History   Marital status: Married    Spouse name: Not on file   Number of children: Not on file   Years of education: Not on file   Highest education level: Not on file  Occupational History  Not on file  Tobacco Use   Smoking status: Never   Smokeless tobacco: Never  Vaping Use   Vaping status: Never Used  Substance and Sexual Activity   Alcohol use: No   Drug use: No   Sexual activity: Yes    Birth control/protection: None  Other Topics Concern   Not on file  Social History Narrative   Not on file   Social Drivers of Health   Financial Resource Strain: Not on file  Food Insecurity: No Food Insecurity (02/08/2024)   Hunger Vital Sign    Worried About Running Out of Food in the Last Year: Never true    Ran Out of Food in the Last Year: Never true  Transportation Needs: No Transportation Needs (02/08/2024)   PRAPARE -  Administrator, Civil Service (Medical): No    Lack of Transportation (Non-Medical): No  Physical Activity: Not on file  Stress: Not on file  Social Connections: Socially Integrated (10/11/2023)   Social Connection and Isolation Panel    Frequency of Communication with Friends and Family: More than three times a week    Frequency of Social Gatherings with Friends and Family: More than three times a week    Attends Religious Services: 1 to 4 times per year    Active Member of [employer] or Organizations: Yes    Attends Engineer, structural: More than 4 times per year    Marital Status: Married  Catering manager Violence: Not At Risk (02/08/2024)   Humiliation, Afraid, Rape, and Kick questionnaire    Fear of Current or Ex-Partner: No    Emotionally Abused: No    Physically Abused: No    Sexually Abused: No    Family History  Problem Relation Age of Onset   Diabetes Mother    Hypertension Mother    Thyroid  disease Mother    Kidney disease Maternal Grandmother    Diabetes Maternal Grandmother    Heart attack Other     Current Outpatient Medications  Medication Sig Dispense Refill   acetaminophen  (TYLENOL ) 500 MG tablet Take 1,000 mg by mouth 2 (two) times daily as needed for moderate pain (pain score 4-6), fever or headache.     calcitRIOL  (ROCALTROL ) 0.5 MCG capsule Take 1.5 mcg by mouth Every Tuesday,Thursday,and Saturday with dialysis. Prior to dialysis on Tuesday, Thursday, Saturdays only     calcium  acetate (PHOSLO ) 667 MG capsule Take 1,334 mg by mouth 3 (three) times daily with meals.     diltiazem  (CARDIZEM  CD) 120 MG 24 hr capsule Take 1 capsule (120 mg total) by mouth every morning. 90 capsule 0   furosemide  (LASIX ) 40 MG tablet Take 80 mg by mouth daily.     midodrine (PROAMATINE) 5 MG tablet Take 5 mg by mouth 3 (three) times daily with meals.     Vitamin D , Ergocalciferol , (DRISDOL ) 1.25 MG (50000 UNIT) CAPS capsule Take 1 capsule (50,000 Units total) by  mouth every Thursday. 5 capsule 0   No current facility-administered medications for this visit.    No Known Allergies   REVIEW OF SYSTEMS:  Negative unless noted in HPI [X]  denotes positive finding, [ ]  denotes negative finding Cardiac  Comments:  Chest pain or chest pressure:    Shortness of breath upon exertion:    Short of breath when lying flat:    Irregular heart rhythm:        Vascular    Pain in calf, thigh, or hip brought on by ambulation:  Pain in feet at night that wakes you up from your sleep:     Blood clot in your veins:    Leg swelling:         Pulmonary    Oxygen at home:    Productive cough:     Wheezing:         Neurologic    Sudden weakness in arms or legs:     Sudden numbness in arms or legs:     Sudden onset of difficulty speaking or slurred speech:    Temporary loss of vision in one eye:     Problems with dizziness:         Gastrointestinal    Blood in stool:     Vomited blood:         Genitourinary    Burning when urinating:     Blood in urine:        Psychiatric    Major depression:         Hematologic    Bleeding problems:    Problems with blood clotting too easily:        Skin    Rashes or ulcers:        Constitutional    Fever or chills:      PHYSICAL EXAMINATION:  Vitals:   04/12/24 0829  BP: 131/77  Pulse: 90  Resp: 16  Temp: 97.9 F (36.6 C)  TempSrc: Temporal  SpO2: 96%  Weight: 166 lb 1.6 oz (75.3 kg)  Height: 5' 5 (1.651 m)    General:  WDWN in NAD; vital signs documented above Gait: Not observed HENT: WNL, normocephalic Pulmonary: normal non-labored breathing Cardiac: regular HR Abdomen: soft, NT, no masses Skin: without rashes Vascular Exam/Pulses: Palpable left radial pulse Extremities: Soft thrill palpable near arterial anastomosis Musculoskeletal: no muscle wasting or atrophy  Neurologic: A&O X 3 Psychiatric:  The pt has Normal affect.   Non-Invasive Vascular Imaging:   Patent left arm AV  graft with areas of elevated velocities; greater than 1000 mL/min flow volume    ASSESSMENT/PLAN:: 43 y.o. female here for follow up for evaluation of left arm AV graft  Patent left arm AV graft with palpable thrill and no signs or symptoms of steal syndrome in the left hand.  Duplex demonstrates a patent graft with areas of elevated velocities but a greater than 1000 mL/min flow volume.  On exam graft is easily palpable throughout the upper arm.  The dialysis center has not attempted cannulating left arm graft since [facility] placement in July.  Recommend attempted cannulation of left arm AV graft tomorrow.  If cannulation continues to be difficult we will repeat shuntogram.   Donnice Sender, PA-C [facility] [phone]  Clinic MD:   Gretta on call

## 2024-04-15 ENCOUNTER — Ambulatory Visit
Admission: RE | Admit: 2024-04-15 | Discharge: 2024-04-15 | Disposition: A | Discharge status: Home / Self Care | Source: Ambulatory Visit | Attending: Family Medicine | Admitting: Family Medicine

## 2024-04-15 DIAGNOSIS — Z1231 Encounter for screening mammogram for malignant neoplasm of breast: Secondary | ICD-10-CM

## 2024-05-07 DIAGNOSIS — N2581 Secondary hyperparathyroidism of renal origin: Secondary | ICD-10-CM | POA: Diagnosis not present

## 2024-05-07 DIAGNOSIS — Z992 Dependence on renal dialysis: Secondary | ICD-10-CM | POA: Diagnosis not present

## 2024-05-07 DIAGNOSIS — N186 End stage renal disease: Secondary | ICD-10-CM | POA: Diagnosis not present

## 2024-05-10 ENCOUNTER — Encounter (HOSPITAL_COMMUNITY)
Admission: RE | Disposition: A | Discharge status: Home / Self Care | Payer: Self-pay | Source: Home / Self Care | Attending: Surgery

## 2024-05-10 ENCOUNTER — Ambulatory Visit (HOSPITAL_COMMUNITY)
Admission: RE | Admit: 2024-05-10 | Discharge: 2024-05-10 | Disposition: A | Discharge status: Home / Self Care | Attending: Surgery | Admitting: Surgery

## 2024-05-10 ENCOUNTER — Encounter (HOSPITAL_COMMUNITY): Payer: Self-pay | Admitting: Surgery

## 2024-05-10 ENCOUNTER — Other Ambulatory Visit: Payer: Self-pay

## 2024-05-10 DIAGNOSIS — I872 Venous insufficiency (chronic) (peripheral): Secondary | ICD-10-CM | POA: Diagnosis not present

## 2024-05-10 DIAGNOSIS — Z95820 Peripheral vascular angioplasty status with implants and grafts: Secondary | ICD-10-CM

## 2024-05-10 DIAGNOSIS — N186 End stage renal disease: Secondary | ICD-10-CM | POA: Diagnosis not present

## 2024-05-10 DIAGNOSIS — T82858A Stenosis of vascular prosthetic devices, implants and grafts, initial encounter: Secondary | ICD-10-CM | POA: Diagnosis not present

## 2024-05-10 DIAGNOSIS — Y832 Surgical operation with anastomosis, bypass or graft as the cause of abnormal reaction of the patient, or of later complication, without mention of misadventure at the time of the procedure: Secondary | ICD-10-CM | POA: Insufficient documentation

## 2024-05-10 DIAGNOSIS — I120 Hypertensive chronic kidney disease with stage 5 chronic kidney disease or end stage renal disease: Secondary | ICD-10-CM | POA: Insufficient documentation

## 2024-05-10 DIAGNOSIS — Z992 Dependence on renal dialysis: Secondary | ICD-10-CM | POA: Diagnosis not present

## 2024-05-10 DIAGNOSIS — E1122 Type 2 diabetes mellitus with diabetic chronic kidney disease: Secondary | ICD-10-CM | POA: Insufficient documentation

## 2024-05-10 HISTORY — PX: VENOUS STENT: CATH118377

## 2024-05-10 HISTORY — PX: VENOUS ANGIOPLASTY: CATH118376

## 2024-05-10 HISTORY — PX: A/V SHUNT INTERVENTION: CATH118220

## 2024-05-10 SURGERY — A/V SHUNT INTERVENTION
Anesthesia: LOCAL | Site: Arm Upper | Laterality: Left

## 2024-05-10 MED ORDER — HEPARIN (PORCINE) IN NACL 1000-0.9 UT/500ML-% IV SOLN
INTRAVENOUS | Status: DC | PRN
  Administered 2024-05-10: 500 mL

## 2024-05-10 MED ORDER — LIDOCAINE HCL (PF) 1 % IJ SOLN
INTRAMUSCULAR | Status: DC | PRN
  Administered 2024-05-10: 2 mL via SUBCUTANEOUS

## 2024-05-10 MED ORDER — IODIXANOL 320 MG/ML IV SOLN
INTRAVENOUS | Status: DC | PRN
  Administered 2024-05-10: 40 mL via INTRAVENOUS

## 2024-05-10 SURGICAL SUPPLY — 12 items
BALLOON ATHLETIS 9X40X75 (BALLOONS) IMPLANT
BALLOON MUSTANG 7X80X75 (BALLOONS) IMPLANT
BALLOON MUSTANG 8.0X40 75 (BALLOONS) IMPLANT
DEVICE INFLATION ENCORE 26 (MISCELLANEOUS) IMPLANT
GLIDEWIRE ADV .035X180CM (WIRE) IMPLANT
KIT MICROPUNCTURE NIT STIFF (SHEATH) IMPLANT
SHEATH PINNACLE 8F 10CM (SHEATH) IMPLANT
SHEATH PROBE COVER 6X72 (BAG) IMPLANT
STENT VIABAHN 8X100X75 (Permanent Stent) IMPLANT
STOPCOCK MORSE 400PSI 3WAY (MISCELLANEOUS) IMPLANT
TRAY PV CATH (CUSTOM PROCEDURE TRAY) ×2 IMPLANT
TUBING CIL FLEX 10 FLL-RA (TUBING) IMPLANT

## 2024-05-10 NOTE — H&P (Signed)
 [facility]  Patient name: Jacqueline [name] MRN: [id number] DOB: April 29, 1981 Sex: female   REASON FOR VISIT:    ESRD  HISOTRY OF PRESENT ILLNESS:    [name] is a 43 y.o. female with history of a left arm AV graft in October 2024.  She has had angioplasty of the outflow and March and May 2025.  Her graft has now clotted.  Her last dialysis was Tuesday.   PAST MEDICAL HISTORY:   Past Medical History:  Diagnosis Date   Anemia    Anxiety    Chronic bronchitis (HCC)    Chronic kidney disease    Diabetes (HCC) 01/01/2014   Hypertension    Increased frequency of headaches    Morbid obesity (HCC)    Necrotizing fasciitis (HCC)    Sleep apnea    does not use CPAP - - gastric bypass   Type II diabetes mellitus (HCC)    no meds, diet controlled     FAMILY HISTORY:   Family History  Problem Relation Age of Onset   Diabetes Mother    Hypertension Mother    Thyroid  disease Mother    Kidney disease Maternal Grandmother    Diabetes Maternal Grandmother    Heart attack Other     SOCIAL HISTORY:   Social History   Tobacco Use   Smoking status: Never   Smokeless tobacco: Never  Substance Use Topics   Alcohol use: No     ALLERGIES:   No Known Allergies   CURRENT MEDICATIONS:   No current facility-administered medications for this encounter.    REVIEW OF SYSTEMS:   [X]  denotes positive finding, [ ]  denotes negative finding Cardiac  Comments:  Chest pain or chest pressure:    Shortness of breath upon exertion:    Short of breath when lying flat:    Irregular heart rhythm:        Vascular    Pain in calf, thigh, or hip brought on by ambulation:    Pain in feet at night that wakes you up from your sleep:     Blood clot in your veins:    Leg swelling:         Pulmonary    Oxygen at home:    Productive cough:     Wheezing:         Neurologic    Sudden weakness in arms or legs:      Sudden numbness in arms or legs:     Sudden onset of difficulty speaking or slurred speech:    Temporary loss of vision in one eye:     Problems with dizziness:         Gastrointestinal    Blood in stool:     Vomited blood:         Genitourinary    Burning when urinating:     Blood in urine:        Psychiatric    Major depression:         Hematologic    Bleeding problems:    Problems with blood clotting too easily:        Skin    Rashes or ulcers:        Constitutional    Fever or chills:      PHYSICAL EXAM:   Vitals:   05/10/24 0712 05/10/24 0750  BP: (!) 175/98 (!) 152/106  Pulse: 97 93  Resp: 12 16  Temp: 98.2 F (36.8 C)  TempSrc: Oral   SpO2: 100% 100%    GENERAL: The patient is a well-nourished female, in no acute distress. The vital signs are documented above. CARDIAC: There is a regular rate and rhythm.  VASCULAR: Palpable pulse and graft proximally, not distally PULMONARY: Non-labored respirations ABDOMEN: Soft and non-tender with normal pitched bowel sounds.  MUSCULOSKELETAL: There are no major deformities or cyanosis. NEUROLOGIC: No focal weakness or paresthesias are detected. SKIN: There are no ulcers or rashes noted. PSYCHIATRIC: The patient has a normal affect.  STUDIES:   =  MEDICAL ISSUES:   ESRD: I discussed that we need to proceed with a shuntogram to better evaluate her graft.  I will plan for thrombectomy and treatment of any underlying lesion.  With a occluded graft, I did discuss there is always a possibility that we place a catheter and need to consider new access.  She understands all this.  All questions were answered and she wished to proceed.    Malvina Serene CLORE, MD, FACS [facility] [phone] Pager [phone]

## 2024-05-10 NOTE — Op Note (Signed)
    Patient name: Jacqueline [name] MRN: [id number] DOB: May 25, 1981 Sex: female  05/10/2024 Pre-operative Diagnosis: ESRD Post-operative diagnosis:  Same Surgeon:  Malvina New Procedure Performed:  1.  Ultrasound-guided access, left upper arm dialysis graft  2.  Shuntogram  3.  Stent, left brachial vein (peripheral)    Indications: This is a 43 year old female with a left upper arm graft who was sent over because they thought it was thrombosed.  It had a faint pulse and it.  We discussed proceeding with intervention.  Procedure:  The patient was identified in the holding area and taken to room 8.  The patient was then placed supine on the table and prepped and draped in the usual sterile fashion.  A time out was called.  ltrasound was used to evaluate the fistula.  The vein was patent and compressible.  A digital ultrasound image was acquired.  The graft was then accessed under ultrasound guidance using a micropuncture needle.  An 018 wire was then asvanced without resistance and a micropuncture sheath was placed.  Contrast injections were then performed through the sheath.  Findings: The central venous system was widely patent.  The arteriovenous anastomosis was widely patent.  At the venous outflow, there was a 90% stenosis.  The body of the graft was widely patent   Intervention: After the above images were acquired the decision was made to proceed with intervention.  Over a Glidewire advantage and 8 [place] sheath was placed.  I elected to primarily stent this because previous venoplasty's did not achieve optimal results.  I selected an 8 x 100 Viabahn and deployed this across the lesion.  I dilated this with a 7 mm balloon however there was still a residual waist at the area of stenosis and so I upsized to an 8 Mustang and again had persistent narrowing therefore I upsized to a 9 mm noncompliant balloon into the atmospheres to 28.  There was a given the stenosis and the balloon was fully effaced.   Completion imaging showed no residual stenosis.  The wire was removed.  The sheath was removed with a Monocryl  Impression:  #1  Successful stenting of a venous outflow stenosis with an 8 mm by  #2  Access remains amenable to future percutaneous intervention    V. Malvina New, M.D., [facility] [place] Office: [phone] Pager:  [phone]

## 2024-05-11 ENCOUNTER — Emergency Department (HOSPITAL_COMMUNITY)

## 2024-05-11 ENCOUNTER — Encounter (HOSPITAL_COMMUNITY): Payer: Self-pay

## 2024-05-11 ENCOUNTER — Emergency Department (HOSPITAL_COMMUNITY)
Admission: EM | Admit: 2024-05-11 | Discharge: 2024-05-11 | Disposition: A | Discharge status: Home / Self Care | Attending: Emergency Medicine | Admitting: Emergency Medicine

## 2024-05-11 ENCOUNTER — Other Ambulatory Visit: Payer: Self-pay

## 2024-05-11 DIAGNOSIS — E11610 Type 2 diabetes mellitus with diabetic neuropathic arthropathy: Secondary | ICD-10-CM | POA: Diagnosis not present

## 2024-05-11 DIAGNOSIS — R0989 Other specified symptoms and signs involving the circulatory and respiratory systems: Secondary | ICD-10-CM | POA: Diagnosis not present

## 2024-05-11 DIAGNOSIS — Z992 Dependence on renal dialysis: Secondary | ICD-10-CM | POA: Insufficient documentation

## 2024-05-11 DIAGNOSIS — I5033 Acute on chronic diastolic (congestive) heart failure: Secondary | ICD-10-CM | POA: Diagnosis not present

## 2024-05-11 DIAGNOSIS — J45909 Unspecified asthma, uncomplicated: Secondary | ICD-10-CM | POA: Diagnosis not present

## 2024-05-11 DIAGNOSIS — T82590A Other mechanical complication of surgically created arteriovenous fistula, initial encounter: Secondary | ICD-10-CM | POA: Diagnosis not present

## 2024-05-11 DIAGNOSIS — T82511A Breakdown (mechanical) of surgically created arteriovenous shunt, initial encounter: Secondary | ICD-10-CM | POA: Diagnosis not present

## 2024-05-11 DIAGNOSIS — I132 Hypertensive heart and chronic kidney disease with heart failure and with stage 5 chronic kidney disease, or end stage renal disease: Secondary | ICD-10-CM | POA: Diagnosis not present

## 2024-05-11 DIAGNOSIS — Z955 Presence of coronary angioplasty implant and graft: Secondary | ICD-10-CM | POA: Diagnosis not present

## 2024-05-11 DIAGNOSIS — Z79899 Other long term (current) drug therapy: Secondary | ICD-10-CM | POA: Diagnosis not present

## 2024-05-11 DIAGNOSIS — N186 End stage renal disease: Secondary | ICD-10-CM | POA: Insufficient documentation

## 2024-05-11 DIAGNOSIS — T82511D Breakdown (mechanical) of surgically created arteriovenous shunt, subsequent encounter: Secondary | ICD-10-CM

## 2024-05-11 DIAGNOSIS — E1142 Type 2 diabetes mellitus with diabetic polyneuropathy: Secondary | ICD-10-CM | POA: Diagnosis not present

## 2024-05-11 DIAGNOSIS — N2581 Secondary hyperparathyroidism of renal origin: Secondary | ICD-10-CM | POA: Diagnosis not present

## 2024-05-11 LAB — URINALYSIS, ROUTINE W REFLEX MICROSCOPIC
Bilirubin Urine: NEGATIVE
Glucose, UA: 50 mg/dL — AB
Hgb urine dipstick: NEGATIVE
Ketones, ur: NEGATIVE mg/dL
Leukocytes,Ua: NEGATIVE
Nitrite: NEGATIVE
Protein, ur: 100 mg/dL — AB
Specific Gravity, Urine: 1.01 (ref 1.005–1.030)
pH: 8 (ref 5.0–8.0)

## 2024-05-11 LAB — CBC
HCT: 34.2 % — ABNORMAL LOW (ref 36.0–46.0)
Hemoglobin: 10.7 g/dL — ABNORMAL LOW (ref 12.0–15.0)
MCH: 30.5 pg (ref 26.0–34.0)
MCHC: 31.3 g/dL (ref 30.0–36.0)
MCV: 97.4 fL (ref 80.0–100.0)
Platelets: 324 K/uL (ref 150–400)
RBC: 3.51 MIL/uL — ABNORMAL LOW (ref 3.87–5.11)
RDW: 14.4 % (ref 11.5–15.5)
WBC: 9.1 K/uL (ref 4.0–10.5)
nRBC: 0 % (ref 0.0–0.2)

## 2024-05-11 LAB — COMPREHENSIVE METABOLIC PANEL WITH GFR
ALT: 22 U/L (ref 0–44)
AST: 27 U/L (ref 15–41)
Albumin: 3.6 g/dL (ref 3.5–5.0)
Alkaline Phosphatase: 65 U/L (ref 38–126)
Anion gap: 20 — ABNORMAL HIGH (ref 5–15)
BUN: 59 mg/dL — ABNORMAL HIGH (ref 6–20)
CO2: 18 mmol/L — ABNORMAL LOW (ref 22–32)
Calcium: 8.5 mg/dL — ABNORMAL LOW (ref 8.9–10.3)
Chloride: 98 mmol/L (ref 98–111)
Creatinine, Ser: 9.49 mg/dL — ABNORMAL HIGH (ref 0.44–1.00)
GFR, Estimated: 5 mL/min — ABNORMAL LOW (ref >60)
Glucose, Bld: 82 mg/dL (ref 70–99)
Potassium: 4.4 mmol/L (ref 3.5–5.1)
Sodium: 136 mmol/L (ref 135–145)
Total Bilirubin: 0.8 mg/dL (ref 0.0–1.2)
Total Protein: 7.5 g/dL (ref 6.5–8.1)

## 2024-05-11 LAB — HCG, SERUM, QUALITATIVE: Preg, Serum: NEGATIVE

## 2024-05-11 NOTE — ED Triage Notes (Signed)
 Pt arrived from home via pOV c/o clogged dialysis shunt. Pt has been unable to get dialysis d/t the shunt not working.

## 2024-05-11 NOTE — Progress Notes (Signed)
 VASCULAR LAB    Left upper extremity duplex of dialysis access has been performed.  See CV proc for preliminary results.   Midas Daughety, RVT 05/11/2024, 12:14 PM

## 2024-05-11 NOTE — ED Notes (Signed)
Patient transported to Vascular 

## 2024-05-11 NOTE — ED Provider Notes (Signed)
 [place] EMERGENCY DEPARTMENT AT [facility] Provider Note  CSN: [id number] Arrival date & time: 05/11/24 9362  Chief Complaint(s) Vascular Access Problem  HPI Jacqueline [name] is a 43 y.o. female history of ESRD on dialysis, diabetes, hypertension, presenting to the emergency department shunt issue.  Patient reports last dialysis session was around 10 days ago.  Since then has had difficulty getting shunt access.  Still urinating.  Reports yesterday came to see vascular surgery and had a shuntogram and intervention but reports today when they tried to use the shunt only clots came out of it.    Past Medical History Past Medical History:  Diagnosis Date   Anemia    Anxiety    Chronic bronchitis (HCC)    Chronic kidney disease    Diabetes (HCC) 01/01/2014   Hypertension    Increased frequency of headaches    Morbid obesity (HCC)    Necrotizing fasciitis (HCC)    Sleep apnea    does not use CPAP - - gastric bypass   Type II diabetes mellitus (HCC)    no meds, diet controlled   Patient Active Problem List   Diagnosis Date Noted   Charcot's arthropathy associated with type 2 diabetes mellitus (HCC) 02/14/2024   Demand ischemia (HCC) 02/10/2024   Sleep apnea in adult 02/10/2024   Benign hypertension with ESRD (end-stage renal disease) (HCC) 02/10/2024   Chronic diastolic heart failure (HCC) 02/10/2024   Hypocalcemia due to chronic kidney disease 10/11/2023   Cellulitis of right leg 08/30/2022   Adjustment disorder with depressed mood 07/01/2022   Debility 06/29/2022   Necrotizing fasciitis (HCC) 06/29/2022   Viral illness 08/17/2020   Elevated d-dimer 08/17/2020   Acute lower UTI 08/17/2020   Hypoalbuminemia 08/17/2020   Complicated migraine 12/12/2019   Right hemiplegia (HCC) 12/02/2019   Hypertension    AKI (acute kidney injury)    Stage 5 chronic kidney disease not on chronic dialysis (HCC)    Obesity, Class III, BMI 40-49.9 (morbid obesity) (HCC)    Anemia  of chronic disease    HLD (hyperlipidemia) 07/02/2018   Acute on chronic diastolic CHF (congestive heart failure) (HCC) 07/02/2018   Depression 07/02/2018   OSA (obstructive sleep apnea) 07/02/2018   Acute on chronic diastolic (congestive) heart failure (HCC) 07/02/2018   Cellulitis of right lower extremity 07/02/2018   Anemia 07/02/2018   Chest pain 05/04/2018   Hypertensive urgency 05/03/2018   Left facial numbness 05/03/2018   Headache 05/03/2018   Elevated troponin 05/03/2018   Hyperlipidemia associated with type 2 diabetes mellitus (HCC) 03/08/2018   Hypertension associated with stage 3 chronic kidney disease due to type 2 diabetes mellitus (HCC) 03/08/2018   Vitamin D  deficiency 12/21/2017   Generalized anxiety disorder 08/07/2017   Diabetic peripheral neuropathy (HCC) 06/21/2017   History of asthma 06/21/2017   Steatosis of liver 06/21/2017   Microalbuminuric diabetic nephropathy (HCC) 06/01/2017   Raised TSH level 06/01/2017   Thrombocytosis 06/01/2017   Allergic rhinitis 05/30/2017   PAF (paroxysmal atrial fibrillation) (HCC) 05/25/2017   Sepsis (HCC) 05/03/2017    Class: Present on Admission   Acute renal failure superimposed on stage 3 chronic kidney disease (HCC) 05/03/2017    Class: Stage 3   Neck pain    Acidosis, metabolic    Hyponatremia    Normocytic anemia 04/30/2017   Staphylococcus aureus bacteremia 03/15/2016   Streptococcal bacteremia 03/15/2016   Tachycardia 03/14/2016   Fever 03/14/2016   Bad headache 03/14/2016   Home Medication(s) Prior to  Admission medications   Medication Sig Start Date End Date Taking? Authorizing Provider  acetaminophen  (TYLENOL ) 500 MG tablet Take 1,000 mg by mouth 2 (two) times daily as needed for moderate pain (pain score 4-6), fever or headache.    [provider]  calcitRIOL  (ROCALTROL ) 0.5 MCG capsule Take 1.5 mcg by mouth Every Tuesday,Thursday,and Saturday with dialysis. Prior to dialysis on Tuesday, Thursday,  Saturdays only 10/03/23   [provider]  calcium  acetate (PHOSLO ) 667 MG capsule Take 1,334 mg by mouth 3 (three) times daily with meals.    [provider]  diltiazem  (CARDIZEM  CD) 120 MG 24 hr capsule Take 1 capsule (120 mg total) by mouth every morning. 02/16/24   Gherghe, Costin M, MD  furosemide  (LASIX ) 40 MG tablet Take 80 mg by mouth daily. 11/15/23   [provider]  midodrine (PROAMATINE) 5 MG tablet Take 5 mg by mouth 3 (three) times daily with meals.    [provider]  Vitamin D , Ergocalciferol , (DRISDOL ) 1.25 MG (50000 UNIT) CAPS capsule Take 1 capsule (50,000 Units total) by mouth every Thursday. 07/07/22   Angiulli, Toribio PARAS, PA-C                                                                                                                                    Past Surgical History Past Surgical History:  Procedure Laterality Date   A/V SHUNT INTERVENTION N/A 11/17/2023   Procedure: A/V SHUNT INTERVENTION;  Surgeon: Tobie Gordy POUR, MD;  Location: [facility] INVASIVE CV LAB;  Service: Cardiovascular;  Laterality: N/A;  80% venous anastomosis   A/V SHUNT INTERVENTION Left 05/10/2024   Procedure: A/V SHUNT INTERVENTION;  Surgeon: Serene Gaile ORN, MD;  Location: [facility] PV LAB;  Service: Cardiovascular;  Laterality: Left;   AV FISTULA PLACEMENT Left 05/02/2023   Procedure: LEFT ARM ARTERIOVENOUS (AV) FISTULA  GRAFT;  Surgeon: Pearline Norman RAMAN, MD;  Location: [facility] OR;  Service: Vascular;  Laterality: Left;   BARIATRIC SURGERY     BREAST REDUCTION SURGERY  03/21/2017   CARDIAC CATHETERIZATION  01/06/2016   CARDIAC CATHETERIZATION N/A 01/06/2016   Procedure: Left Heart Cath and Coronary Angiography;  Surgeon: Deatrice DELENA Cage, MD;  Location: [facility] INVASIVE CV LAB;  Service: Cardiovascular;  Laterality: N/A;   CESAREAN SECTION  08/2014   I & D EXTREMITY Right 06/18/2022   Procedure: IRRIGATION AND DEBRIDEMENT RIGHT ABDOMEN AND THIGH;  Surgeon: Kinsinger, Herlene Righter, MD;   Location: [facility] OR;  Service: General;  Laterality: Right;   I & D EXTREMITY Right 06/20/2022   Procedure: WOUND EXPLORATION OF RIGHT THIGH AND RIGHT GROIN WITH IRRIGATION AND DEBRIDEMENT;  Surgeon: Curvin Deward MOULD, MD;  Location: [facility] OR;  Service: General;  Laterality: Right;   IR AV DIALY SHUNT INTRO NEEDLE/INTRACATH INITIAL W/PTA/IMG LEFT  10/19/2023   IR AV DIALY SHUNT INTRO NEEDLE/INTRACATH INITIAL W/PTA/IMG LEFT  02/13/2024   IR DIALY SHUNT INTRO NEEDLE/INTRACATH INITIAL  W/IMG LEFT Left 11/29/2023   IR FLUORO GUIDE CV LINE RIGHT  02/10/2024   IR FLUORO GUIDE CV LINE RIGHT  02/15/2024   IR US  GUIDE VASC ACCESS LEFT  02/13/2024   IR US  GUIDE VASC ACCESS RIGHT  02/10/2024   REDUCTION MAMMAPLASTY Bilateral 03/21/2017   TRANSESOPHAGEAL ECHOCARDIOGRAM (CATH LAB) N/A 10/16/2023   Procedure: TRANSESOPHAGEAL ECHOCARDIOGRAM;  Surgeon: Barbaraann Darryle Ned, MD;  Location: [facility] INVASIVE CV LAB;  Service: Cardiovascular;  Laterality: N/A;   VENOUS ANGIOPLASTY Left 05/10/2024   Procedure: VENOUS ANGIOPLASTY;  Surgeon: Serene Gaile ORN, MD;  Location: [facility] PV LAB;  Service: Cardiovascular;  Laterality: Left;  Venous Anastomosis   VENOUS STENT Left 05/10/2024   Procedure: VENOUS STENT;  Surgeon: Serene Gaile ORN, MD;  Location: [facility] PV LAB;  Service: Cardiovascular;  Laterality: Left;  8x10 Viabahn   Family History Family History  Problem Relation Age of Onset   Diabetes Mother    Hypertension Mother    Thyroid  disease Mother    Kidney disease Maternal Grandmother    Diabetes Maternal Grandmother    Heart attack Other     Social History Social History   Tobacco Use   Smoking status: Never   Smokeless tobacco: Never  Vaping Use   Vaping status: Never Used  Substance Use Topics   Alcohol use: No   Drug use: No   Allergies Patient has no known allergies.  Review of Systems Review of Systems  All other systems reviewed and are negative.   Physical Exam Vital Signs  I have reviewed the triage vital  signs BP 133/70 (BP Location: Right Arm)   Pulse 95   Temp 98.5 F (36.9 C) (Oral)   Resp 19   Ht 5' 4 (1.626 m)   Wt 74.8 kg   LMP 04/27/2024 (Approximate)   SpO2 100%   BMI 28.32 kg/m  Physical Exam Vitals and nursing note reviewed.  Constitutional:      General: She is not in acute distress.    Appearance: She is well-developed.  HENT:     Head: Normocephalic and atraumatic.     Mouth/Throat:     Mouth: Mucous membranes are moist.  Eyes:     Pupils: Pupils are equal, round, and reactive to light.  Cardiovascular:     Rate and Rhythm: Normal rate and regular rhythm.     Heart sounds: No murmur heard. Pulmonary:     Effort: Pulmonary effort is normal. No respiratory distress.     Breath sounds: Normal breath sounds.  Abdominal:     General: Abdomen is flat.     Palpations: Abdomen is soft.     Tenderness: There is no abdominal tenderness.  Musculoskeletal:        General: No tenderness.     Right lower leg: No edema.     Left lower leg: No edema.     Comments: LUE AV Fistula with thrill. Bilateral 2+ radial pulses  Skin:    General: Skin is warm and dry.  Neurological:     General: No focal deficit present.     Mental Status: She is alert. Mental status is at baseline.  Psychiatric:        Mood and Affect: Mood normal.        Behavior: Behavior normal.     ED Results and Treatments Labs (all labs ordered are listed, but only abnormal results are displayed) Labs Reviewed  COMPREHENSIVE METABOLIC PANEL WITH GFR - Abnormal; Notable for the following components:  Result Value   CO2 18 (*)    BUN 59 (*)    Creatinine, Ser 9.49 (*)    Calcium  8.5 (*)    GFR, Estimated 5 (*)    Anion gap 20 (*)    All other components within normal limits  CBC - Abnormal; Notable for the following components:   RBC 3.51 (*)    Hemoglobin 10.7 (*)    HCT 34.2 (*)    All other components within normal limits  URINALYSIS, ROUTINE W REFLEX MICROSCOPIC - Abnormal; Notable  for the following components:   Glucose, UA 50 (*)    Protein, ur 100 (*)    Bacteria, UA RARE (*)    All other components within normal limits  HCG, SERUM, QUALITATIVE                                                                                                                          Radiology VAS US  DUPLEX DIALYSIS ACCESS (AVF, AVG) Result Date: 05/11/2024 DIALYSIS ACCESS Patient Name:  YANETTE TRIPOLI  Date of Exam:   05/11/2024 Medical Rec #: [id number]     Accession #:    [id number] Date of Birth: February 11, 1981     Patient Gender: F Patient Age:   10 years Exam Location:  [facility] Procedure:      VAS US  DUPLEX DIALYSIS ACCESS (AVF, AVG) Referring Phys: ELSIE BODY --------------------------------------------------------------------------------  Reason for Exam: Unable to dialyze through AVF/AVG. Access Site: Left Upper Extremity. Access Type: 05/02/2023 brachial-axillary straight graft. History: Patient with multiple angioplasty to the left arm AV graft outflow in          March and May 2025. Graft clotted Thursday, 05/09/24. Status post          shuntogram and left brachial stent 05/10/24. Patient returned to          dialysis this morning. Unable to dialyze secondary to clotting. Limitations: Acoustic shadowing Comparison Study: Prior left upper extremity duplex done 04/12/24 Performing Technologist: Alberta Lis RVS  Examination Guidelines: A complete evaluation includes B-mode imaging, spectral Doppler, color Doppler, and power Doppler as needed of all accessible portions of each vessel. Unilateral testing is considered an integral part of a complete examination. Limited examinations for reoccurring indications may be performed as noted.  Findings:   +------------------+----------+-----------------+--------+ AVG               PSV (cm/s)Flow Vol (mL/min)Describe +------------------+----------+-----------------+--------+ Venous anastomosis   983                               +------------------+----------+-----------------+--------+  Summary: Arteriovenous fistula-Elevated velocities and a velocity ratio of greater than 3 noted in the Axilla. Arteriovenous fistula-Velocities less than 100cm/s noted.  Visualized portions of the brachial vein stent and AV fistula appear patent. There is acoustic shadowing limiting full visualization of the stent.  *See table(s) above for measurements and observations.    --------------------------------------------------------------------------------  Preliminary    DG Chest 1 View Result Date: 05/11/2024 EXAM: 1 VIEW(S) XRAY OF THE CHEST 05/11/2024 08:18:00 AM COMPARISON: 02/08/2024 CLINICAL HISTORY: ESRD (end stage renal disease) (HCC) 798742. Reason for exam: ESRD (end stage renal disease) FINDINGS: LUNGS AND PLEURA: Low lung volumes. No focal pulmonary opacity. No pulmonary edema. No pleural effusion. No pneumothorax. HEART AND MEDIASTINUM: No acute abnormality of the cardiac and mediastinal silhouettes. BONES AND SOFT TISSUES: Old healed left lateral rib fractures. IMPRESSION: 1. No acute cardiopulmonary abnormality detected. Electronically signed by: Waddell Calk MD 05/11/2024 08:45 AM EDT RP Workstation: [id number]    Pertinent labs & imaging results that were available during my care of the patient were reviewed by me and considered in my medical decision making (see MDM for details).  Medications Ordered in ED Medications - No data to display                                                                                                                                   Procedures Procedures  (including critical care time)  Medical Decision Making / ED Course   MDM:  43 year old presenting to the emergency department with fistula problem.  On exam, patient has left upper extremity with palpable thrill, 2+ distal pulse  Has missed dialysis sessions, vitals notable for hypertension, otherwise exam without pulmonary  finding or lower extremity edema.  Labs reassuring.  Patient did have shunt intervention yesterday with vascular surgery.  Will check shunt ultrasound and discuss with vascular surgery.  Clinical Course as of 05/11/24 1351  Sat May 11, 2024  1348 Ultrasound shows some elevated velocities, but overall appears patent.  Discussed with Dr. Silver with vascular surgery, he recommends that the patient can follow-up outpatient for consideration of HD catheter placement versus attempting to have dialysis performed again on next session.  He does not think the patient needs further inpatient treatment.  Discussed with patient who is agreeable to this.  Understands need to call for further follow-up.  Labs are overall reassuring, no evidence of any underlying condition needing emergent dialysis at this time. Will discharge patient to home. All questions answered. Patient comfortable with plan of discharge. Return precautions discussed with patient and specified on the after visit summary.  [WS]    Clinical Course User Index [WS] Francesca Elsie CROME, MD     Additional history obtained: -Additional history obtained from ems -External records from outside source obtained and reviewed including: Chart review including previous notes, labs, imaging, consultation notes including vascular notes    Lab Tests: -I ordered, reviewed, and interpreted labs.   The pertinent results include:   Labs Reviewed  COMPREHENSIVE METABOLIC PANEL WITH GFR - Abnormal; Notable for the following components:      Result Value   CO2 18 (*)    BUN 59 (*)    Creatinine, Ser 9.49 (*)    Calcium  8.5 (*)    GFR, Estimated  5 (*)    Anion gap 20 (*)    All other components within normal limits  CBC - Abnormal; Notable for the following components:   RBC 3.51 (*)    Hemoglobin 10.7 (*)    HCT 34.2 (*)    All other components within normal limits  URINALYSIS, ROUTINE W REFLEX MICROSCOPIC - Abnormal; Notable for the following  components:   Glucose, UA 50 (*)    Protein, ur 100 (*)    Bacteria, UA RARE (*)    All other components within normal limits  HCG, SERUM, QUALITATIVE    Notable for mild acidosis likely due to ESRD  Imaging Studies ordered: I ordered imaging studies including US  graft On my interpretation imaging demonstrates intact flow I independently visualized and interpreted imaging. I agree with the radiologist interpretation   Medicines ordered and prescription drug management: No orders of the defined types were placed in this encounter.   -I have reviewed the patients home medicines and have made adjustments as needed   Consultations Obtained: I requested consultation with the vascular surgeon Dr. Silver ,  and discussed lab and imaging findings as well as pertinent plan - they recommend: outpatient follow up    Cardiac Monitoring: The patient was maintained on a cardiac monitor.  I personally viewed and interpreted the cardiac monitored which showed an underlying rhythm of: NSR  Reevaluation: After the interventions noted above, I reevaluated the patient and found that their symptoms have improved  Co morbidities that complicate the patient evaluation  Past Medical History:  Diagnosis Date   Anemia    Anxiety    Chronic bronchitis (HCC)    Chronic kidney disease    Diabetes (HCC) 01/01/2014   Hypertension    Increased frequency of headaches    Morbid obesity (HCC)    Necrotizing fasciitis (HCC)    Sleep apnea    does not use CPAP - - gastric bypass   Type II diabetes mellitus (HCC)    no meds, diet controlled      Dispostion: Disposition decision including need for hospitalization was considered, and patient discharged from emergency department.    Final Clinical Impression(s) / ED Diagnoses Final diagnoses:  Malfunction of arteriovenous graft, initial encounter     This chart was dictated using voice recognition software.  Despite best efforts to proofread,   errors can occur which can change the documentation meaning.    Francesca Elsie CROME, MD 05/11/24 1351

## 2024-05-11 NOTE — Discharge Instructions (Signed)
 We evaluated you for your dialysis graft malfunction.  Your testing in the emergency department was overall reassuring.  We do not think you need emergency dialysis today.  We discussed your testing with the vascular surgeon who recommended following up with the vascular clinic at [facility].  You may need a dialysis catheter placement hopefully they can do this on Monday.  Please return if you have any new or worsening symptoms such as difficulty breathing, significant leg swelling, lightheadedness or dizziness, fainting, chest pain, or any other concerning symptoms.

## 2024-05-16 DIAGNOSIS — D631 Anemia in chronic kidney disease: Secondary | ICD-10-CM | POA: Diagnosis not present

## 2024-05-16 DIAGNOSIS — Z992 Dependence on renal dialysis: Secondary | ICD-10-CM | POA: Diagnosis not present

## 2024-05-16 DIAGNOSIS — N2581 Secondary hyperparathyroidism of renal origin: Secondary | ICD-10-CM | POA: Diagnosis not present

## 2024-05-16 DIAGNOSIS — N186 End stage renal disease: Secondary | ICD-10-CM | POA: Diagnosis not present

## 2024-05-17 DIAGNOSIS — Z992 Dependence on renal dialysis: Secondary | ICD-10-CM | POA: Diagnosis not present

## 2024-05-17 DIAGNOSIS — N186 End stage renal disease: Secondary | ICD-10-CM | POA: Diagnosis not present

## 2024-05-17 DIAGNOSIS — I120 Hypertensive chronic kidney disease with stage 5 chronic kidney disease or end stage renal disease: Secondary | ICD-10-CM | POA: Diagnosis not present

## 2024-05-18 DIAGNOSIS — N2581 Secondary hyperparathyroidism of renal origin: Secondary | ICD-10-CM | POA: Diagnosis not present

## 2024-05-18 DIAGNOSIS — Z992 Dependence on renal dialysis: Secondary | ICD-10-CM | POA: Diagnosis not present

## 2024-05-20 ENCOUNTER — Encounter: Payer: Self-pay | Admitting: Radiology

## 2024-05-21 DIAGNOSIS — N2581 Secondary hyperparathyroidism of renal origin: Secondary | ICD-10-CM | POA: Diagnosis not present

## 2024-05-21 DIAGNOSIS — Z992 Dependence on renal dialysis: Secondary | ICD-10-CM | POA: Diagnosis not present

## 2024-05-21 DIAGNOSIS — D631 Anemia in chronic kidney disease: Secondary | ICD-10-CM | POA: Diagnosis not present

## 2024-05-23 DIAGNOSIS — N186 End stage renal disease: Secondary | ICD-10-CM | POA: Diagnosis not present

## 2024-05-30 DIAGNOSIS — D631 Anemia in chronic kidney disease: Secondary | ICD-10-CM | POA: Diagnosis not present

## 2024-05-30 DIAGNOSIS — N186 End stage renal disease: Secondary | ICD-10-CM | POA: Diagnosis not present

## 2024-05-30 DIAGNOSIS — Z992 Dependence on renal dialysis: Secondary | ICD-10-CM | POA: Diagnosis not present

## 2024-05-30 DIAGNOSIS — N2581 Secondary hyperparathyroidism of renal origin: Secondary | ICD-10-CM | POA: Diagnosis not present

## 2024-06-04 DIAGNOSIS — Z992 Dependence on renal dialysis: Secondary | ICD-10-CM | POA: Diagnosis not present

## 2024-06-04 DIAGNOSIS — N186 End stage renal disease: Secondary | ICD-10-CM | POA: Diagnosis not present

## 2024-06-04 DIAGNOSIS — D631 Anemia in chronic kidney disease: Secondary | ICD-10-CM | POA: Diagnosis not present

## 2024-06-04 DIAGNOSIS — N2581 Secondary hyperparathyroidism of renal origin: Secondary | ICD-10-CM | POA: Diagnosis not present

## 2024-06-05 ENCOUNTER — Encounter (HOSPITAL_BASED_OUTPATIENT_CLINIC_OR_DEPARTMENT_OTHER): Payer: Self-pay

## 2024-06-05 ENCOUNTER — Other Ambulatory Visit: Payer: Self-pay

## 2024-06-05 ENCOUNTER — Emergency Department (HOSPITAL_BASED_OUTPATIENT_CLINIC_OR_DEPARTMENT_OTHER)

## 2024-06-05 ENCOUNTER — Emergency Department (HOSPITAL_BASED_OUTPATIENT_CLINIC_OR_DEPARTMENT_OTHER)
Admission: EM | Admit: 2024-06-05 | Discharge: 2024-06-05 | Disposition: A | Discharge status: Home / Self Care | Attending: Emergency Medicine | Admitting: Emergency Medicine

## 2024-06-05 DIAGNOSIS — R000 Tachycardia, unspecified: Secondary | ICD-10-CM | POA: Diagnosis not present

## 2024-06-05 DIAGNOSIS — N854 Malposition of uterus: Secondary | ICD-10-CM | POA: Diagnosis not present

## 2024-06-05 DIAGNOSIS — I120 Hypertensive chronic kidney disease with stage 5 chronic kidney disease or end stage renal disease: Secondary | ICD-10-CM | POA: Diagnosis not present

## 2024-06-05 DIAGNOSIS — Z79899 Other long term (current) drug therapy: Secondary | ICD-10-CM | POA: Insufficient documentation

## 2024-06-05 DIAGNOSIS — E1122 Type 2 diabetes mellitus with diabetic chronic kidney disease: Secondary | ICD-10-CM | POA: Insufficient documentation

## 2024-06-05 DIAGNOSIS — D649 Anemia, unspecified: Secondary | ICD-10-CM | POA: Diagnosis not present

## 2024-06-05 DIAGNOSIS — N840 Polyp of corpus uteri: Secondary | ICD-10-CM | POA: Insufficient documentation

## 2024-06-05 DIAGNOSIS — N186 End stage renal disease: Secondary | ICD-10-CM | POA: Insufficient documentation

## 2024-06-05 DIAGNOSIS — Z992 Dependence on renal dialysis: Secondary | ICD-10-CM

## 2024-06-05 DIAGNOSIS — N939 Abnormal uterine and vaginal bleeding, unspecified: Secondary | ICD-10-CM | POA: Insufficient documentation

## 2024-06-05 LAB — BASIC METABOLIC PANEL WITH GFR
Anion gap: 17 — ABNORMAL HIGH (ref 5–15)
BUN: 43 mg/dL — ABNORMAL HIGH (ref 6–20)
CO2: 25 mmol/L (ref 22–32)
Calcium: 8.9 mg/dL (ref 8.9–10.3)
Chloride: 102 mmol/L (ref 98–111)
Creatinine, Ser: 7.46 mg/dL — ABNORMAL HIGH (ref 0.44–1.00)
GFR, Estimated: 6 mL/min — ABNORMAL LOW (ref >60)
Glucose, Bld: 101 mg/dL — ABNORMAL HIGH (ref 70–99)
Potassium: 5 mmol/L (ref 3.5–5.1)
Sodium: 143 mmol/L (ref 135–145)

## 2024-06-05 LAB — CBC WITH DIFFERENTIAL/PLATELET
Abs Immature Granulocytes: 0.02 K/uL (ref 0.00–0.07)
Basophils Absolute: 0.1 K/uL (ref 0.0–0.1)
Basophils Relative: 1 %
Eosinophils Absolute: 0.3 K/uL (ref 0.0–0.5)
Eosinophils Relative: 4 %
HCT: 25.4 % — ABNORMAL LOW (ref 36.0–46.0)
Hemoglobin: 7.9 g/dL — ABNORMAL LOW (ref 12.0–15.0)
Immature Granulocytes: 0 %
Lymphocytes Relative: 26 %
Lymphs Abs: 1.9 K/uL (ref 0.7–4.0)
MCH: 29.8 pg (ref 26.0–34.0)
MCHC: 31.1 g/dL (ref 30.0–36.0)
MCV: 95.8 fL (ref 80.0–100.0)
Monocytes Absolute: 0.6 K/uL (ref 0.1–1.0)
Monocytes Relative: 8 %
Neutro Abs: 4.4 K/uL (ref 1.7–7.7)
Neutrophils Relative %: 61 %
Platelets: 273 K/uL (ref 150–400)
RBC: 2.65 MIL/uL — ABNORMAL LOW (ref 3.87–5.11)
RDW: 16.2 % — ABNORMAL HIGH (ref 11.5–15.5)
WBC: 7.2 K/uL (ref 4.0–10.5)
nRBC: 0 % (ref 0.0–0.2)

## 2024-06-05 LAB — WET PREP, GENITAL
Clue Cells Wet Prep HPF POC: NONE SEEN
Sperm: NONE SEEN
Trich, Wet Prep: NONE SEEN
WBC, Wet Prep HPF POC: <10 (ref <10)
Yeast Wet Prep HPF POC: NONE SEEN

## 2024-06-05 LAB — HCG, SERUM, QUALITATIVE: Preg, Serum: NEGATIVE

## 2024-06-05 MED ORDER — MEDROXYPROGESTERONE ACETATE 10 MG PO TABS: 20.0000 mg | ORAL_TABLET | Freq: Every day | ORAL | 0 refills | Status: DC

## 2024-06-05 MED ORDER — MEDROXYPROGESTERONE ACETATE 10 MG PO TABS
20.0000 mg | ORAL_TABLET | Freq: Once | ORAL | Status: AC
  Administered 2024-06-05: 20 mg via ORAL
  Filled 2024-06-05: qty 2

## 2024-06-05 NOTE — ED Triage Notes (Addendum)
 Vaginal bleeding and passing large blood clots x 3 weeks. Clots getting worse. No pain. Feeling dizzy x 2 days. Denies SOB Dialysis yesterday. Reports usually has light periods, but past 2 months has seen increase of bleeding  Has not seen a GYN

## 2024-06-05 NOTE — Discharge Instructions (Addendum)
 Please read and follow all provided instructions.  Your diagnoses today include:  1. Vaginal bleeding   2. Anemia, unspecified type   3. Endometrial polyp   4. ESRD (end stage renal disease) on dialysis [facility])     Tests performed today include: Complete blood cell count: Your hemoglobin had dropped to 7.9, this will need to be closely monitored at your dialysis sessions --to determine if you need a transfusion of blood. Basic metabolic panel Urinalysis (urine test) Pregnancy test (urine or blood, in women only) Transvaginal ultrasound shows suspected endometrial polyp Wet prep: No evidence of yeast infection, bacterial vaginosis, trichomonas Vital signs. See below for your results today.   Medications prescribed:  Provera - medication to slow vaginal bleeding Take any prescribed medications only as directed.  Home care instructions:  Follow any educational materials contained in this packet.  Follow-up instructions: Please call OB/GYN women's clinic referral for follow-up appointment, please follow-up with your doctor in 1 week if you cannot get to see OB/GYN in that time.  Return instructions:  Please return to the Emergency Department if you experience worsening symptoms.  Return if you have worsening signs of anemia this includes severe lightheadedness with standing, worsening shortness of breath.  Return if you have progressive or more severe vaginal bleeding Please return if you have any other emergent concerns.  Additional Information:  Your vital signs today were: BP (!) 155/78   Pulse (!) 104   Temp 98.2 F (36.8 C)   Resp 18   Wt 73.9 kg   LMP 05/27/2024 (Approximate)   SpO2 100%   BMI 27.98 kg/m  If your blood pressure (BP) was elevated above 135/85 this visit, please have this repeated by your doctor within one month. --------------

## 2024-06-05 NOTE — ED Notes (Signed)
 Unable to run urinalysis due to large blood clots in specimen cup

## 2024-06-05 NOTE — ED Provider Notes (Signed)
 [place] [facility] AT [facility] Provider Note   CSN: [id number] Arrival date & time: 06/05/24  9186     Patient presents with: Vaginal Bleeding   Jacqueline [name] is a 43 y.o. female.   Patient with history of ESRD, Tuesday/Thursday/Saturday dialysis --presents to the emergency department for vaginal bleeding that started about 9 days ago (last Monday).  She states that about 1 week ago she started having golf ball sized clots.  She has heavy bleeding.  She is changing a pad up to twice an hour.  Usually her menstrual period last about 3 days.  She has been having some difficulties getting access at dialysis recently but has had normal sessions.  Patient denies abdominal pain or pelvic pain.  No history of vaginal procedures other than cesarean section.  Patient reports increasing lightheadedness with standing, shortness of breath with exertion (such as walking into the ED from her car in a parking lot).  She does not currently have an OB/GYN.       Prior to Admission medications   Medication Sig Start Date End Date Taking? Authorizing Provider  acetaminophen  (TYLENOL ) 500 MG tablet Take 1,000 mg by mouth 2 (two) times daily as needed for moderate pain (pain score 4-6), fever or headache.    [provider]  calcitRIOL  (ROCALTROL ) 0.5 MCG capsule Take 1.5 mcg by mouth Every Tuesday,Thursday,and Saturday with dialysis. Prior to dialysis on Tuesday, Thursday, Saturdays only 10/03/23   [provider]  calcium  acetate (PHOSLO ) 667 MG capsule Take 1,334 mg by mouth 3 (three) times daily with meals.    [provider]  diltiazem  (CARDIZEM  CD) 120 MG 24 hr capsule Take 1 capsule (120 mg total) by mouth every morning. 02/16/24   Gherghe, Costin M, MD  furosemide  (LASIX ) 40 MG tablet Take 80 mg by mouth daily. 11/15/23   [provider]  midodrine (PROAMATINE) 5 MG tablet Take 5 mg by mouth 3 (three) times daily with meals.    [provider]  Vitamin D , Ergocalciferol , (DRISDOL ) 1.25 MG (50000 UNIT) CAPS capsule Take 1 capsule (50,000 Units total) by mouth every Thursday. 07/07/22   Angiulli, Toribio PARAS, PA-C    Allergies: Patient has no known allergies.    Review of Systems  Updated Vital Signs BP (!) 155/78   Pulse (!) 104   Temp 98.2 F (36.8 C)   Resp 18   Wt 73.9 kg   LMP 05/27/2024 (Approximate)   SpO2 100%   BMI 27.98 kg/m   Physical Exam Vitals and nursing note reviewed. Exam conducted with a chaperone present.  Constitutional:      General: She is not in acute distress.    Appearance: She is well-developed.  HENT:     Head: Normocephalic and atraumatic.     Right Ear: External ear normal.     Left Ear: External ear normal.     Nose: Nose normal.     Mouth/Throat:     Mouth: Mucous membranes are moist.  Eyes:     Conjunctiva/sclera: Conjunctivae normal.  Cardiovascular:     Rate and Rhythm: Regular rhythm. Tachycardia present.     Heart sounds: No murmur heard. Pulmonary:     Effort: No respiratory distress.     Breath sounds: No wheezing, rhonchi or rales.  Abdominal:     Palpations: Abdomen is soft.     Tenderness: There is no abdominal tenderness. There is no guarding or rebound.  Genitourinary:    Exam  position: Lithotomy position.     Vagina: Bleeding (Pooling) present.     Cervix: No cervical motion tenderness.     Uterus: Normal.      Adnexa:        Right: No tenderness.         Left: No tenderness.    Musculoskeletal:     Cervical back: Normal range of motion and neck supple.     Right lower leg: No edema.     Left lower leg: No edema.  Skin:    General: Skin is warm and dry.     Findings: No rash.  Neurological:     General: No focal deficit present.     Mental Status: She is alert. Mental status is at baseline.     Motor: No weakness.  Psychiatric:        Mood and Affect: Mood normal.     (all labs ordered are listed, but only abnormal results are  displayed) Labs Reviewed  CBC WITH DIFFERENTIAL/PLATELET - Abnormal; Notable for the following components:      Result Value   RBC 2.65 (*)    Hemoglobin 7.9 (*)    HCT 25.4 (*)    RDW 16.2 (*)    All other components within normal limits  BASIC METABOLIC PANEL WITH GFR - Abnormal; Notable for the following components:   Glucose, Bld 101 (*)    BUN 43 (*)    Creatinine, Ser 7.46 (*)    GFR, Estimated 6 (*)    Anion gap 17 (*)    All other components within normal limits  WET PREP, GENITAL  HCG, SERUM, QUALITATIVE  URINALYSIS, ROUTINE W REFLEX MICROSCOPIC  GC/CHLAMYDIA PROBE AMP ([place]) NOT AT [facility]    EKG: None  Radiology: US  PELVIC COMPLETE WITH TRANSVAGINAL Result Date: 06/05/2024 CLINICAL DATA:  Abnormal uterine bleeding.  Heavy clots. EXAM: TRANSABDOMINAL AND TRANSVAGINAL ULTRASOUND OF PELVIS TECHNIQUE: Both transabdominal and transvaginal ultrasound examinations of the pelvis were performed. Transabdominal technique was performed for global imaging of the pelvis including uterus, ovaries, adnexal regions, and pelvic cul-de-sac. It was necessary to proceed with endovaginal exam following the transabdominal exam to visualize the endometrium and ovaries. COMPARISON:  None Available. FINDINGS: Uterus Measurements: 6.4 x 4.6 x 4.9 cm = volume: 76 mL. The uterus is anteverted. Small peripheral calcifications noted. Endometrium Thickness: 5 mm. There is small amount of mobile debris within the endometrium likely representing proteinaceous debris or blood products. There is an area of vascularity in the midportion of the endometrium which may represent a vascular stalk. Further evaluation with hysteroscopy recommended to evaluate for possibility of an endometrial polyp. Right ovary Measurements: 2.5 x 1.3 x 1.4 cm = volume: 2.4 mL. Normal appearance/no adnexal mass. Left ovary Measurements: 3.6 x 2.4 x 2.1 cm = volume: 9.3 mL. Normal appearance/no adnexal mass. Other findings No  abnormal free fluid. IMPRESSION: 1. Small amount of mobile debris in the endometrium may represent blood product. 2. Apparent vascular stalk in the midportion of the endometrium concerning for an endometrial polyp. Further evaluation with hysteroscopy is recommended. 3. Unremarkable ovaries. Electronically Signed   By: Vanetta Chou M.D.   On: 06/05/2024 11:09     Procedures   Medications Ordered in the ED  medroxyPROGESTERone  (PROVERA ) tablet 20 mg (20 mg Oral Given 06/05/24 1206)    ED Course  Patient seen and examined. History obtained directly from patient.   Labs/EKG: Ordered CBC, urinalysis, urine pregnancy ordered in triage.  I added  chemistry..  Imaging: Ordered pelvic ultrasound  Medications/Fluids: Ordered: None ordered  Most recent vital signs reviewed and are as follows: BP (!) 155/78   Pulse (!) 104   Temp 98.2 F (36.8 C)   Resp 18   Wt 73.9 kg   LMP 05/27/2024 (Approximate)   SpO2 100%   BMI 27.98 kg/m   Initial impression: Vaginal bleeding, will need to assess for low hemoglobin and need for blood transfusion as patient has had symptoms which may be attributed to anemia.  Will also need to obtain an ultrasound to evaluate for any structural abnormality which could be causing bleeding.  Patient looks well, nontoxic.  Vital signs are reassuring other than a mild tachycardia.  She does not have pain or other discharge to make me concerned about a vaginal infection such as PID.  //  Reassessment performed. Patient appears stable.  Labs personally reviewed and interpreted including CBC with normal white blood cell count, hemoglobin decreased to 7.9, down 2.8 from 1 month ago; BMP consistent with ESRD with creatinine of 7.46, BUN 43, electrolytes are normal today; pregnancy test was negative; wet prep negative.  Imaging results reviewed including: Pelvic ultrasound, endometrial polyp noted  I discussed case with [facility]  She recommended  checking cause for underlying ESRD to ensure no thromboembolic risks, but recommend Provera 20 mg daily.  Patient will need outpatient follow-up.  Also asked me to ensure with pharmacy, no renal dosing for Provera.  I did talk with ED pharmacist, Vernell, confirmed no renal dosing necessary.  Orthostatic VS for the past 24 hrs:  BP- Lying Pulse- Lying BP- Sitting Pulse- Sitting BP- Standing at 0 minutes Pulse- Standing at 0 minutes  06/05/24 1046 150/82 99 136/82 105 136/78 108    Plan: Will initiate Provera 20 mg.  Patient denies history of DVT or other clots.  Underlying cause of her ESRD was diabetes and hypertension, reports strong family history.  She does not smoke or use tobacco.  Reviewed pertinent lab work and imaging with patient at bedside.  She was already looking at them on MyChart.  We did discuss her current symptoms.  We did discuss that given her hemoglobin of 7.9, ongoing bleeding, she has at risk of having further drop in her hemoglobin and requiring blood transfusion.  I did talk with her about her symptoms.  She does have symptoms currently, but they do not seem to be severe.  We discussed transfer for blood transfusion today.  She would like to monitor symptoms currently.  She has dialysis at 5:30 AM tomorrow, will have her hemoglobin rechecked at that time.  She has received blood transfusions in the past and typically able to just send her to the transfusion center if needed.  We did discuss signs and symptoms of more profound anemia and need to return if these occur.  She verbalized understanding and agrees with plan.  12:24 PM first dose of Provera given in the ED.  Labs personally reviewed and interpreted including: Wet prep unremarkable.  Most current vital signs reviewed and are as follows: BP (!) 146/77   Pulse 99   Temp 98.2 F (36.8 C)   Resp 13   Wt 73.9 kg   LMP 05/27/2024 (Approximate)   SpO2 100%   BMI 27.98 kg/m   Plan: Discharge to home.    Prescriptions written for: Provera  Other home care instructions discussed: Maintain good hydration, monitor symptoms  ED return instructions discussed: Severe lightheadedness or syncope,  worsening shortness of breath, chest pain.  Encouraged to return if bleeding becomes even heavier.  Follow-up instructions discussed: Patient encouraged to follow-up at dialysis clinic tomorrow.                                   Medical Decision Making Amount and/or Complexity of Data Reviewed Labs: ordered. Radiology: ordered.  Risk Prescription drug management.   Patient with ongoing heavy vaginal bleeding, ultrasound today shows likely endometrial polyp which could be contributing.  Patient already has a history of anemia in regards to ESRD.  Her hemoglobin has dropped over the past 1 month.  She has ongoing bleeding.  She is starting to have symptoms consistent with anemia, but stable vital signs.  Not appreciably orthostatic here but with mild tachycardia.  Offered transfer for blood transfusion, however patient would prefer to monitor.  She has dialysis in the morning and can be rechecked then.  We discussed that typically below 7 we would give a transfusion or with significant symptoms.  Otherwise no pain.  Patient is not pregnant.  Outpatient OB/GYN referral given.  The patient's vital signs, pertinent lab work and imaging were reviewed and interpreted as discussed in the ED course. Hospitalization was considered for further testing, treatments, or serial exams/observation. However as patient is well-appearing, has a stable exam, and reassuring studies today, I do not feel that they warrant admission at this time. This plan was discussed with the patient who verbalizes agreement and comfort with this plan and seems reliable and able to return to the Emergency Department with worsening or changing symptoms.       Final diagnoses:  Vaginal bleeding  Anemia, unspecified type  Endometrial  polyp  ESRD (end stage renal disease) on dialysis [facility])    ED Discharge Orders          Ordered    medroxyPROGESTERone  (PROVERA ) 10 MG tablet  Daily        06/05/24 1216               Desiderio Chew, PA-C 06/05/24 1227    Patsey Lot, MD 06/05/24 1454

## 2024-06-06 LAB — GC/CHLAMYDIA PROBE AMP (~~LOC~~) NOT AT ARMC
Chlamydia: NEGATIVE
Comment: NEGATIVE
Comment: NORMAL
Neisseria Gonorrhea: NEGATIVE

## 2024-06-07 ENCOUNTER — Telehealth: Payer: Self-pay | Admitting: Family Medicine

## 2024-06-07 NOTE — Telephone Encounter (Signed)
 I reviewed the recent ED notes with Dr. Caprice; patient OK to keep transplant provider appointment on Monday. Patient aware

## 2024-06-07 NOTE — Telephone Encounter (Signed)
 Patient called us  today to schedule a follow up appointment from the ED. After reviewing her chart with our RN Waddell and Dr. Lola, they advise that she needs to be seen within the next 2 weeks. Due to availability at our office, we suggested her calling our sister locations to get scheduled as soon as possible. Patient agreed to call the other offices and will call us  back if she decided to schedule with us .

## 2024-06-10 ENCOUNTER — Observation Stay (HOSPITAL_BASED_OUTPATIENT_CLINIC_OR_DEPARTMENT_OTHER)
Admission: EM | Admit: 2024-06-10 | Discharge: 2024-06-13 | Disposition: A | Discharge status: Home / Self Care | Attending: Internal Medicine | Admitting: Internal Medicine

## 2024-06-10 ENCOUNTER — Other Ambulatory Visit: Payer: Self-pay

## 2024-06-10 ENCOUNTER — Encounter (HOSPITAL_BASED_OUTPATIENT_CLINIC_OR_DEPARTMENT_OTHER): Payer: Self-pay

## 2024-06-10 ENCOUNTER — Emergency Department (HOSPITAL_BASED_OUTPATIENT_CLINIC_OR_DEPARTMENT_OTHER)

## 2024-06-10 DIAGNOSIS — G4733 Obstructive sleep apnea (adult) (pediatric): Secondary | ICD-10-CM | POA: Diagnosis not present

## 2024-06-10 DIAGNOSIS — R58 Hemorrhage, not elsewhere classified: Secondary | ICD-10-CM | POA: Diagnosis not present

## 2024-06-10 DIAGNOSIS — I5032 Chronic diastolic (congestive) heart failure: Secondary | ICD-10-CM | POA: Insufficient documentation

## 2024-06-10 DIAGNOSIS — D631 Anemia in chronic kidney disease: Secondary | ICD-10-CM | POA: Diagnosis not present

## 2024-06-10 DIAGNOSIS — T82858A Stenosis of vascular prosthetic devices, implants and grafts, initial encounter: Secondary | ICD-10-CM | POA: Diagnosis not present

## 2024-06-10 DIAGNOSIS — Z79899 Other long term (current) drug therapy: Secondary | ICD-10-CM | POA: Diagnosis not present

## 2024-06-10 DIAGNOSIS — E639 Nutritional deficiency, unspecified: Secondary | ICD-10-CM | POA: Diagnosis not present

## 2024-06-10 DIAGNOSIS — Y832 Surgical operation with anastomosis, bypass or graft as the cause of abnormal reaction of the patient, or of later complication, without mention of misadventure at the time of the procedure: Secondary | ICD-10-CM | POA: Diagnosis not present

## 2024-06-10 DIAGNOSIS — N2581 Secondary hyperparathyroidism of renal origin: Secondary | ICD-10-CM | POA: Insufficient documentation

## 2024-06-10 DIAGNOSIS — Z743 Need for continuous supervision: Secondary | ICD-10-CM | POA: Diagnosis not present

## 2024-06-10 DIAGNOSIS — I509 Heart failure, unspecified: Secondary | ICD-10-CM | POA: Diagnosis not present

## 2024-06-10 DIAGNOSIS — R7989 Other specified abnormal findings of blood chemistry: Secondary | ICD-10-CM | POA: Insufficient documentation

## 2024-06-10 DIAGNOSIS — I120 Hypertensive chronic kidney disease with stage 5 chronic kidney disease or end stage renal disease: Secondary | ICD-10-CM | POA: Insufficient documentation

## 2024-06-10 DIAGNOSIS — D649 Anemia, unspecified: Principal | ICD-10-CM | POA: Insufficient documentation

## 2024-06-10 DIAGNOSIS — Z992 Dependence on renal dialysis: Secondary | ICD-10-CM | POA: Diagnosis not present

## 2024-06-10 DIAGNOSIS — E1122 Type 2 diabetes mellitus with diabetic chronic kidney disease: Secondary | ICD-10-CM | POA: Diagnosis not present

## 2024-06-10 DIAGNOSIS — N840 Polyp of corpus uteri: Secondary | ICD-10-CM | POA: Insufficient documentation

## 2024-06-10 DIAGNOSIS — N939 Abnormal uterine and vaginal bleeding, unspecified: Secondary | ICD-10-CM | POA: Diagnosis present

## 2024-06-10 DIAGNOSIS — R0602 Shortness of breath: Secondary | ICD-10-CM | POA: Diagnosis not present

## 2024-06-10 DIAGNOSIS — I132 Hypertensive heart and chronic kidney disease with heart failure and with stage 5 chronic kidney disease, or end stage renal disease: Secondary | ICD-10-CM | POA: Diagnosis not present

## 2024-06-10 DIAGNOSIS — N186 End stage renal disease: Secondary | ICD-10-CM | POA: Diagnosis not present

## 2024-06-10 LAB — COMPREHENSIVE METABOLIC PANEL WITH GFR
ALT: 17 U/L (ref 0–44)
AST: 22 U/L (ref 15–41)
Albumin: 3.8 g/dL (ref 3.5–5.0)
Alkaline Phosphatase: 53 U/L (ref 38–126)
Anion gap: 16 — ABNORMAL HIGH (ref 5–15)
BUN: 33 mg/dL — ABNORMAL HIGH (ref 6–20)
CO2: 25 mmol/L (ref 22–32)
Calcium: 9.3 mg/dL (ref 8.9–10.3)
Chloride: 103 mmol/L (ref 98–111)
Creatinine, Ser: 6.45 mg/dL — ABNORMAL HIGH (ref 0.44–1.00)
GFR, Estimated: 8 mL/min — ABNORMAL LOW (ref >60)
Glucose, Bld: 83 mg/dL (ref 70–99)
Potassium: 4.3 mmol/L (ref 3.5–5.1)
Sodium: 143 mmol/L (ref 135–145)
Total Bilirubin: 0.5 mg/dL (ref 0.0–1.2)
Total Protein: 6.6 g/dL (ref 6.5–8.1)

## 2024-06-10 LAB — CBC WITH DIFFERENTIAL/PLATELET
Abs Immature Granulocytes: 0.02 K/uL (ref 0.00–0.07)
Basophils Absolute: 0.1 K/uL (ref 0.0–0.1)
Basophils Relative: 1 %
Eosinophils Absolute: 0.3 K/uL (ref 0.0–0.5)
Eosinophils Relative: 4 %
HCT: 17.1 % — ABNORMAL LOW (ref 36.0–46.0)
Hemoglobin: 5.3 g/dL — CL (ref 12.0–15.0)
Immature Granulocytes: 0 %
Lymphocytes Relative: 28 %
Lymphs Abs: 2.3 K/uL (ref 0.7–4.0)
MCH: 30.6 pg (ref 26.0–34.0)
MCHC: 31 g/dL (ref 30.0–36.0)
MCV: 98.8 fL (ref 80.0–100.0)
Monocytes Absolute: 0.6 K/uL (ref 0.1–1.0)
Monocytes Relative: 8 %
Neutro Abs: 5.1 K/uL (ref 1.7–7.7)
Neutrophils Relative %: 59 %
Platelets: 275 K/uL (ref 150–400)
RBC: 1.73 MIL/uL — ABNORMAL LOW (ref 3.87–5.11)
RDW: 17.6 % — ABNORMAL HIGH (ref 11.5–15.5)
WBC: 8.4 K/uL (ref 4.0–10.5)
nRBC: 0.4 % — ABNORMAL HIGH (ref 0.0–0.2)

## 2024-06-10 LAB — CBC
HCT: 17.4 % — ABNORMAL LOW (ref 36.0–46.0)
Hemoglobin: 5.2 g/dL — CL (ref 12.0–15.0)
MCH: 29.9 pg (ref 26.0–34.0)
MCHC: 29.9 g/dL — ABNORMAL LOW (ref 30.0–36.0)
MCV: 100 fL (ref 80.0–100.0)
Platelets: 265 K/uL (ref 150–400)
RBC: 1.74 MIL/uL — ABNORMAL LOW (ref 3.87–5.11)
RDW: 17.2 % — ABNORMAL HIGH (ref 11.5–15.5)
WBC: 8.2 K/uL (ref 4.0–10.5)
nRBC: 0.2 % (ref 0.0–0.2)

## 2024-06-10 LAB — PREPARE RBC (CROSSMATCH)

## 2024-06-10 LAB — TROPONIN T, HIGH SENSITIVITY: Troponin T High Sensitivity: 83 ng/L — ABNORMAL HIGH (ref 0–19)

## 2024-06-10 LAB — RESP PANEL BY RT-PCR (RSV, FLU A&B, COVID)  RVPGX2
Influenza A by PCR: NEGATIVE
Influenza B by PCR: NEGATIVE
Resp Syncytial Virus by PCR: NEGATIVE
SARS Coronavirus 2 by RT PCR: NEGATIVE

## 2024-06-10 LAB — ABO/RH: ABO/RH(D): B POS

## 2024-06-10 LAB — PRO BRAIN NATRIURETIC PEPTIDE: Pro Brain Natriuretic Peptide: 4761 pg/mL — ABNORMAL HIGH (ref <300.0)

## 2024-06-10 MED ORDER — CALCITRIOL 0.5 MCG PO CAPS
0.5000 ug | ORAL_CAPSULE | Freq: Every day | ORAL | Status: DC
  Administered 2024-06-11 – 2024-06-12 (×2): 0.5 ug via ORAL
  Filled 2024-06-10 (×3): qty 1

## 2024-06-10 MED ORDER — OXYCODONE HCL 5 MG PO TABS
5.0000 mg | ORAL_TABLET | Freq: Once | ORAL | Status: AC
  Administered 2024-06-10: 5 mg via ORAL
  Filled 2024-06-10: qty 1

## 2024-06-10 MED ORDER — ACETAMINOPHEN 500 MG PO TABS
1000.0000 mg | ORAL_TABLET | Freq: Once | ORAL | Status: AC
  Administered 2024-06-10: 1000 mg via ORAL
  Filled 2024-06-10: qty 2

## 2024-06-10 MED ORDER — CHLORHEXIDINE GLUCONATE CLOTH 2 % EX PADS
6.0000 | MEDICATED_PAD | Freq: Every day | CUTANEOUS | Status: DC
  Administered 2024-06-11 – 2024-06-12 (×2): 6 via TOPICAL

## 2024-06-10 MED ORDER — MEDROXYPROGESTERONE ACETATE 10 MG PO TABS: 20.0000 mg | ORAL_TABLET | Freq: Every day | ORAL | Status: DC

## 2024-06-10 MED ORDER — MEGESTROL ACETATE 40 MG PO TABS
40.0000 mg | ORAL_TABLET | Freq: Two times a day (BID) | ORAL | Status: DC
  Administered 2024-06-11 – 2024-06-12 (×5): 40 mg via ORAL
  Filled 2024-06-10 (×8): qty 1

## 2024-06-10 MED ORDER — SODIUM CHLORIDE 0.9% IV SOLUTION: Freq: Once | INTRAVENOUS | Status: AC

## 2024-06-10 MED ORDER — CALCIUM ACETATE (PHOS BINDER) 667 MG PO CAPS
1334.0000 mg | ORAL_CAPSULE | Freq: Three times a day (TID) | ORAL | Status: DC
  Administered 2024-06-10 – 2024-06-12 (×5): 1334 mg via ORAL
  Filled 2024-06-10 (×9): qty 2

## 2024-06-10 MED ORDER — MIDODRINE HCL 5 MG PO TABS
5.0000 mg | ORAL_TABLET | Freq: Three times a day (TID) | ORAL | Status: DC
  Administered 2024-06-10 – 2024-06-12 (×7): 5 mg via ORAL
  Filled 2024-06-10 (×9): qty 1

## 2024-06-10 MED ORDER — DILTIAZEM HCL ER COATED BEADS 120 MG PO CP24
120.0000 mg | ORAL_CAPSULE | Freq: Every morning | ORAL | Status: DC
  Administered 2024-06-11 – 2024-06-12 (×2): 120 mg via ORAL
  Filled 2024-06-10 (×3): qty 1

## 2024-06-10 MED ORDER — FUROSEMIDE 40 MG PO TABS
80.0000 mg | ORAL_TABLET | Freq: Every day | ORAL | Status: DC
  Administered 2024-06-11 – 2024-06-12 (×2): 80 mg via ORAL
  Filled 2024-06-10 (×3): qty 2

## 2024-06-10 NOTE — ED Provider Notes (Signed)
 [name] Provider Note   CSN: [id number] Arrival date & time: 06/10/24  9187     Patient presents with: Shortness of Breath and Vaginal Bleeding   Jacqueline [name] is a 43 y.o. female with history of ESRD on HD (Tuesday, Thursday, Saturday), diabetes, hypertension, diastolic CHF, chronic bronchitis presents with complaints of persistent vaginal bleeding with now chest pain and shortness of breath.  Patient reports that she has had persistent painless vaginal bleeding over the past 3 weeks.  Was evaluated here on 11/19.  Hemoglobin is 7.9.  Ultrasound notable for endometrial polyp.  Referral to OB/GYN.  However she has been unable to get into see OB/GYN.  Reports that she is soaking a pad every hour.  Her chest pain is central is exertional.     Shortness of Breath Vaginal Bleeding     Past Medical History:  Diagnosis Date   Anemia    Anxiety    Chronic bronchitis (HCC)    Chronic kidney disease    Diabetes (HCC) 01/01/2014   Hypertension    Increased frequency of headaches    Morbid obesity (HCC)    Necrotizing fasciitis (HCC)    Sleep apnea    does not use CPAP - - gastric bypass   Type II diabetes mellitus (HCC)    no meds, diet controlled   Past Surgical History:  Procedure Laterality Date   A/V FISTULAGRAM N/A 06/12/2024   Procedure: A/V Fistulagram;  Surgeon: Gretta Lonni PARAS, MD;  Location: [facility] INVASIVE CV LAB;  Service: Cardiovascular;  Laterality: N/A;   A/V SHUNT INTERVENTION N/A 11/17/2023   Procedure: A/V SHUNT INTERVENTION;  Surgeon: Tobie Gordy POUR, MD;  Location: [facility] INVASIVE CV LAB;  Service: Cardiovascular;  Laterality: N/A;  80% venous anastomosis   A/V SHUNT INTERVENTION Left 05/10/2024   Procedure: A/V SHUNT INTERVENTION;  Surgeon: Serene Gaile ORN, MD;  Location: [facility] PV LAB;  Service: Cardiovascular;  Laterality: Left;   AV FISTULA PLACEMENT Left 05/02/2023   Procedure: LEFT ARM ARTERIOVENOUS (AV) FISTULA   GRAFT;  Surgeon: Pearline Norman RAMAN, MD;  Location: [facility] OR;  Service: Vascular;  Laterality: Left;   BARIATRIC SURGERY     BREAST REDUCTION SURGERY  03/21/2017   CARDIAC CATHETERIZATION  01/06/2016   CARDIAC CATHETERIZATION N/A 01/06/2016   Procedure: Left Heart Cath and Coronary Angiography;  Surgeon: Deatrice DELENA Cage, MD;  Location: [facility] INVAS[facility] CV LAB;  Service: Cardiovascular;  Laterality: N/A;   CESAREAN SECTION  08/2014   I & D EXTREMITY Right 06/18/2022   Procedure: IRRIGATION AND DEBRIDEMENT RIGHT ABDOMEN AND THIGH;  Surgeon: Kinsinger, Herlene Righter, MD;  Location: [facility] OR;  Service: General;  Laterality: Right;   I & D EXTREMITY Right 06/20/2022   Procedure: WOUND EXPLORATION OF RIGHT THIGH AND RIGHT GROIN WITH IRRIGATION AND DEBRIDEMENT;  Surgeon: Curvin Deward MOULD, MD;  Location: [facility] OR;  Service: General;  Laterality: Right;   IR AV DIALY SHUNT INTRO NEEDLE/INTRACATH INITIAL W/PTA/IMG LEFT  10/19/2023   IR AV DIALY SHUNT INTRO NEEDLE/INTRACATH INITIAL W/PTA/IMG LEFT  02/13/2024   IR DIALY SHUNT INTRO NEEDLE/INTRACATH INITIAL W/IMG LEFT Left 11/29/2023   IR FLUORO GUIDE CV LINE RIGHT  02/10/2024   IR FLUORO GUIDE CV LINE RIGHT  02/15/2024   IR US  GUIDE VASC ACCESS LEFT  02/13/2024   IR US  GUIDE VASC ACCESS RIGHT  02/10/2024   REDUCTION MAMMAPLASTY Bilateral 03/21/2017   TRANSESOPHAGEAL ECHOCARDIOGRAM (CATH LAB) N/A 10/16/2023   Procedure: TRANSESOPHAGEAL ECHOCARDIOGRAM;  Surgeon: Barbaraann Darryle Ned, MD;  Location: [facility] INVASIVE CV LAB;  Service: Cardiovascular;  Laterality: N/A;   UPPER EXTREMITY INTERVENTION  06/12/2024   Procedure: UPPER EXTREMITY INTERVENTION;  Surgeon: Gretta Lonni PARAS, MD;  [facility]: [facility];  Service: Cardiovascular;;   VENOUS ANGIOPLASTY Left 05/10/2024   Procedure: VENOUS ANGIOPLASTY;  Surgeon: Serene Gaile ORN, MD;  Location: [facility];  Service: Cardiovascular;  Laterality: Left;  Venous Anastomosis   VENOUS STENT Left 05/10/2024   Procedure: VENOUS STENT;   Surgeon: Serene Gaile ORN, MD;  Location: [facility];  Service: Cardiovascular;  Laterality: Left;  8x10 Viabahn     Prior to Admission medications   Medication Sig Start Date End Date Taking? Authorizing Provider  acetaminophen  (TYLENOL ) 500 MG tablet Take 1,000 mg by mouth 2 (two) times daily as needed for moderate pain (pain score 4-6), fever or headache.   Yes [provider]  calcitRIOL  (ROCALTROL ) 0.5 MCG capsule Take 0.5 mcg by mouth daily. Prior to dialysis on Tuesday, Thursday, Saturdays only 10/03/23  Yes [provider]  calcium  acetate (PHOSLO ) 667 MG capsule Take 1,334 mg by mouth 3 (three) times daily with meals.   Yes [provider]  diltiazem  (CARDIZEM  CD) 120 MG 24 hr capsule Take 1 capsule (120 mg total) by mouth every morning. 02/16/24  Yes Gherghe, Costin M, MD  furosemide  (LASIX ) 40 MG tablet Take 80 mg by mouth daily. 11/15/23  Yes [provider]  midodrine  (PROAMATINE ) 5 MG tablet Take 5 mg by mouth 3 (three) times daily with meals.   Yes [provider]  Vitamin D , Ergocalciferol , (DRISDOL ) 1.25 MG (50000 UNIT) CAPS capsule Take 1 capsule (50,000 Units total) by mouth every Thursday. 07/07/22  Yes Angiulli, Toribio PARAS, PA-C  megestrol  (MEGACE ) 40 MG tablet Take 1 tablet (40 mg total) by mouth 3 (three) times daily. 06/13/24   Vann, Jessica U, DO    Allergies: Patient has no known allergies.    Review of Systems  Respiratory:  Positive for shortness of breath.   Genitourinary:  Positive for vaginal bleeding.    Updated Vital Signs BP 130/74 (BP Location: Right Arm)   Pulse 87   Temp 98.1 F (36.7 C) (Oral)   Resp 18   Ht 5' 4 (1.626 m)   Wt 74.7 kg   LMP 05/27/2024 (Approximate)   SpO2 100%   BMI 28.27 kg/m   Physical Exam Vitals and nursing note reviewed.  Constitutional:      General: She is not in acute distress.    Appearance: She is well-developed.  HENT:     Head: Normocephalic and atraumatic.  Eyes:      Conjunctiva/sclera: Conjunctivae normal.  Cardiovascular:     Rate and Rhythm: Normal rate and regular rhythm.     Heart sounds: No murmur heard. Pulmonary:     Effort: Pulmonary effort is normal. No respiratory distress.     Breath sounds: Normal breath sounds.  Abdominal:     Palpations: Abdomen is soft.     Tenderness: There is no abdominal tenderness.  Musculoskeletal:        General: No swelling.     Cervical back: Neck supple.  Skin:    General: Skin is warm and dry.     Capillary Refill: Capillary refill takes less than 2 seconds.  Neurological:     Mental Status: She is alert.  Psychiatric:        Mood and Affect: Mood normal.     (  all labs ordered are listed, but only abnormal results are displayed) Labs Reviewed  CBC WITH DIFFERENTIAL/PLATELET - Abnormal; Notable for the following components:      Result Value   RBC 1.73 (*)    Hemoglobin 5.3 (*)    HCT 17.1 (*)    RDW 17.6 (*)    nRBC 0.4 (*)    All other components within normal limits  PRO BRAIN NATRIURETIC PEPTIDE - Abnormal; Notable for the following components:   Pro Brain Natriuretic Peptide 4,761.0 (*)    All other components within normal limits  COMPREHENSIVE METABOLIC PANEL WITH GFR - Abnormal; Notable for the following components:   BUN 33 (*)    Creatinine, Ser 6.45 (*)    GFR, Estimated 8 (*)    Anion gap 16 (*)    All other components within normal limits  CBC - Abnormal; Notable for the following components:   RBC 1.74 (*)    Hemoglobin 5.2 (*)    HCT 17.4 (*)    MCHC 29.9 (*)    RDW 17.2 (*)    All other components within normal limits  CBC - Abnormal; Notable for the following components:   RBC 2.71 (*)    Hemoglobin 8.1 (*)    HCT 23.6 (*)    RDW 18.7 (*)    All other components within normal limits  GLUCOSE, CAPILLARY - Abnormal; Notable for the following components:   Glucose-Capillary 113 (*)    All other components within normal limits  GLUCOSE, CAPILLARY - Abnormal; Notable  for the following components:   Glucose-Capillary 60 (*)    All other components within normal limits  RENAL FUNCTION PANEL - Abnormal; Notable for the following components:   BUN 33 (*)    Creatinine, Ser 5.41 (*)    Phosphorus 6.7 (*)    Albumin  2.6 (*)    GFR, Estimated 9 (*)    All other components within normal limits  CBC - Abnormal; Notable for the following components:   RBC 2.87 (*)    Hemoglobin 8.5 (*)    HCT 26.0 (*)    RDW 18.1 (*)    All other components within normal limits  GLUCOSE, CAPILLARY - Abnormal; Notable for the following components:   Glucose-Capillary 108 (*)    All other components within normal limits  GLUCOSE, CAPILLARY - Abnormal; Notable for the following components:   Glucose-Capillary 63 (*)    All other components within normal limits  GLUCOSE, CAPILLARY - Abnormal; Notable for the following components:   Glucose-Capillary 133 (*)    All other components within normal limits  RENAL FUNCTION PANEL - Abnormal; Notable for the following components:   BUN 28 (*)    Creatinine, Ser 4.00 (*)    Phosphorus 5.5 (*)    Albumin  3.1 (*)    GFR, Estimated 13 (*)    All other components within normal limits  GLUCOSE, CAPILLARY - Abnormal; Notable for the following components:   Glucose-Capillary 134 (*)    All other components within normal limits  TROPONIN T, HIGH SENSITIVITY - Abnormal; Notable for the following components:   Troponin T High Sensitivity 83 (*)    All other components within normal limits  RESP PANEL BY RT-PCR (RSV, FLU A&B, COVID)  RVPGX2  GLUCOSE, CAPILLARY  HEPATITIS B SURFACE ANTIGEN  HEPATITIS B SURFACE ANTIBODY, QUANTITATIVE  GLUCOSE, CAPILLARY  GLUCOSE, CAPILLARY  TYPE AND SCREEN  PREPARE RBC (CROSSMATCH)  ABO/RH  BLOOD TRANSFUSION REPORT - SCANNED  Narrative:    Ordered by an unspecified provider.    EKG: EKG Interpretation Date/Time:  Monday June 10 2024 08:37:32 EST Ventricular Rate:  103 PR  Interval:  141 QRS Duration:  104 QT Interval:  354 QTC Calculation: 464 R Axis:   74  Text Interpretation: Sinus tachycardia Borderline low voltage, extremity leads Confirmed by Neysa Clap [phone]) on 06/10/2024 10:33:12 AM  Radiology: No results found.   Procedures   Medications Ordered in the ED  acetaminophen  (TYLENOL ) tablet 1,000 mg (1,000 mg Oral Given 06/10/24 1248)  oxyCODONE  (Oxy IR/ROXICODONE ) immediate release tablet 5 mg (5 mg Oral Given 06/10/24 1247)  0.9 %  sodium chloride  infusion (Manually program via Guardrails IV Fluids) ( Intravenous New Bag/Given 06/10/24 1723)  HYDROcodone -acetaminophen  (NORCO/VICODIN) 5-325 MG per tablet 1 tablet (1 tablet Oral Given by Other 06/11/24 0604)  acetaminophen  (TYLENOL ) tablet 650 mg (650 mg Oral Given 06/11/24 1251)  diphenhydrAMINE  (BENADRYL ) capsule 25 mg (25 mg Oral Given 06/12/24 1707)    Clinical Course as of 06/19/24 2209  Mon Jun 10, 2024  1034 Patient patient on dialysis evaluated for complaints of persistent vaginal bleeding with now associated exertional chest pain or shortness of breath.  Upon arrival she is hemodynamically stable.  On exam her lungs are clear, has trace lower extremity edema will obtain routine labs chest x-ray and EKG. [JT]  1035 CBC with Differential(!!) Hemoglobin of 5.3 compared to 7.9 a week ago [JT]  1035 Comprehensive metabolic panel(!) Creatinine of 6.45 in the setting of ESRD, anion gap of 16 [JT]  1035 Troponin T, High Sensitivity(!) Elevated to 83, will trend [JT]  1035 Brain natriuretic peptide(!) Elevated 4761 in the setting of ESRD and CHF Last echo 7/25 with a hyperdynamic EF of 75% [JT]  1040 Resp panel by RT-PCR (RSV, Flu A&B, Covid) Anterior Nasal Swab Negative [JT]  1213 Discussed patient with Dr. Dennise, agreed for admission for transfusion.  Without any active chest pain and EKG that is nonischemic, recommended only obtaining 1 troponin, as her initial troponin is likely in  the setting of demand.  Called lab to cancel delta troponin.  Request to update nephrology about emergent dialysis.  Nephrology consulted. [JT]  1234 Notified by [facility] that no progressive beds available.  Given urgent need for transfusion will pursue ED to ED transfer.  Dr. Jerrol accepting. [JT]    Clinical Course User Index [JT] Donnajean Lynwood DEL, PA-C                                 Medical Decision Making Amount and/or Complexity of Data Reviewed Labs: ordered. Decision-making details documented in ED Course. Radiology: ordered.  Risk OTC drugs. Prescription drug management. Decision regarding hospitalization.   This patient presents to the ED with chief complaint(s) of shortness of breath.  The complaint involves an extensive differential diagnosis and also carries with it a high risk of complications and morbidity.   Pertinent past medical history as listed in HPI  The differential diagnosis includes  Anemia, fluid overload, ACS Additional history obtained: Records reviewed Care Everywhere/External Records  Disposition:   Patient will be admitted for further workup and management.  Social Determinants of Health:   none  This note was dictated with voice recognition software.  Despite best efforts at proofreading, errors may have occurred which can change the documentation meaning.       Final diagnoses:  Anemia, unspecified type  ED Discharge Orders          Ordered    megestrol  (MEGACE ) 40 MG tablet  3 times daily        06/13/24 0827    Increase activity slowly        06/13/24 0827    Discharge instructions       Comments: Renal diet   06/13/24 0827    megestrol  (MEGACE ) 40 MG tablet  2 times daily,   Status:  Discontinued        06/12/24 1053               Donnajean Lynwood DEL, PA-C 06/19/24 2209    Neysa Caron PARAS, DO 07/02/24 1429

## 2024-06-10 NOTE — Consult Note (Signed)
 [facility] Renal Consultation Note    Indication for Consultation:  Management of ESRD/hemodialysis, anemia, hypertension/volume, and secondary hyperparathyroidism.  HPI: Jacqueline [name] is a 43 y.o. female with PMH including ESRD, T2DM, HTN who presented to the ED with vaginal bleeding. She had an ED visit 5 days ago for the same issue and received a depo shot, which she says did not slow her bleeding at all. Reports she has had vaginal bleeding with large clots for the past week. She became progressively more weak, SOB, hypotensive and had CP with exertion prompting a return to the ED today. Labs notable for 5.3 (was 7.9 on 06/05/24). VS notable for moderate hypertension now in the ED. 2 units PRBC and vaginal US  ordered. Nephrology was consulted for ESRD. Her last dialysis was Saturday, has not missed any treatments lately. Next HD should be today based on holiday schedule. Reports only 1-2L UF lately, has had poor appetite and limited PO intake. She denies N/V/D, dysuria, hematuria, fever, flank pain, chills.   Past Medical History:  Diagnosis Date   Anemia    Anxiety    Chronic bronchitis (HCC)    Chronic kidney disease    Diabetes (HCC) 01/01/2014   Hypertension    Increased frequency of headaches    Morbid obesity (HCC)    Necrotizing fasciitis (HCC)    Sleep apnea    does not use CPAP - - gastric bypass   Type II diabetes mellitus (HCC)    no meds, diet controlled   Past Surgical History:  Procedure Laterality Date   A/V SHUNT INTERVENTION N/A 11/17/2023   Procedure: A/V SHUNT INTERVENTION;  Surgeon: Tobie Gordy POUR, MD;  Location: [facility] INVASIVE CV LAB;  Service: Cardiovascular;  Laterality: N/A;  80% venous anastomosis   A/V SHUNT INTERVENTION Left 05/10/2024   Procedure: A/V SHUNT INTERVENTION;  Surgeon: Serene Gaile ORN, MD;  Location: [facility];  Service: Cardiovascular;  Laterality: Left;   AV FISTULA PLACEMENT Left 05/02/2023   Procedure: LEFT ARM ARTERIOVENOUS (AV)  FISTULA  GRAFT;  Surgeon: Pearline Norman RAMAN, MD;  Location: [facility] OR;  Service: Vascular;  Laterality: Left;   BARIATRIC SURGERY     BREAST REDUCTION SURGERY  03/21/2017   CARDIAC CATHETERIZATION  01/06/2016   CARDIAC CATHETERIZATION N/A 01/06/2016   Procedure: Left Heart Cath and Coronary Angiography;  Surgeon: Deatrice DELENA Cage, MD;  Location: [facility] INVASIVE CV LAB;  Service: Cardiovascular;  Laterality: N/A;   CESAREAN SECTION  08/2014   I & D EXTREMITY Right 06/18/2022   Procedure: IRRIGATION AND DEBRIDEMENT RIGHT ABDOMEN AND THIGH;  Surgeon: Kinsinger, Herlene Righter, MD;  Location: [facility] OR;  Service: General;  Laterality: Right;   I & D EXTREMITY Right 06/20/2022   Procedure: WOUND EXPLORATION OF RIGHT THIGH AND RIGHT GROIN WITH IRRIGATION AND DEBRIDEMENT;  Surgeon: Curvin Deward MOULD, MD;  Location: [facility] OR;  Service: General;  Laterality: Right;   IR AV DIALY SHUNT INTRO NEEDLE/INTRACATH INITIAL W/PTA/IMG LEFT  10/19/2023   IR AV DIALY SHUNT INTRO NEEDLE/INTRACATH INITIAL W/PTA/IMG LEFT  02/13/2024   IR DIALY SHUNT INTRO NEEDLE/INTRACATH INITIAL W/IMG LEFT Left 11/29/2023   IR FLUORO GUIDE CV LINE RIGHT  02/10/2024   IR FLUORO GUIDE CV LINE RIGHT  02/15/2024   IR US  GUIDE VASC ACCESS LEFT  02/13/2024   IR US  GUIDE VASC ACCESS RIGHT  02/10/2024   REDUCTION MAMMAPLASTY Bilateral 03/21/2017   TRANSESOPHAGEAL ECHOCARDIOGRAM (CATH LAB) N/A 10/16/2023   Procedure: TRANSESOPHAGEAL ECHOCARDIOGRAM;  Surgeon: Barbaraann Darryle Ned,  MD;  Location: [facility] INVASIVE CV LAB;  Service: Cardiovascular;  Laterality: N/A;   VENOUS ANGIOPLASTY Left 05/10/2024   Procedure: VENOUS ANGIOPLASTY;  Surgeon: Serene Gaile ORN, MD;  Location: [facility];  Service: Cardiovascular;  Laterality: Left;  Venous Anastomosis   VENOUS STENT Left 05/10/2024   Procedure: VENOUS STENT;  Surgeon: Serene Gaile ORN, MD;  Location: [facility];  Service: Cardiovascular;  Laterality: Left;  8x10 Viabahn   Family History  Problem Relation Age of Onset    Diabetes Mother    Hypertension Mother    Thyroid  disease Mother    Kidney disease Maternal Grandmother    Diabetes Maternal Grandmother    Heart attack Other    Social History:  reports that she has never smoked. She has never used smokeless tobacco. She reports that she does not drink alcohol and does not use drugs.  ROS: As per HPI otherwise negative.   Physical Exam: Vitals:   06/10/24 0836 06/10/24 0839 06/10/24 0841 06/10/24 1247  BP: (!) 149/77   (!) 169/86  Resp: 13 20 13  (!) 21  Temp:  98.5 F (36.9 C)  98.9 F (37.2 C)  TempSrc:  Oral  Oral  SpO2:  100%    Weight: 74.4 kg        General: Well developed, well nourished, in no acute distress. Head: Normocephalic, atraumatic, sclera non-icteric, mucus membranes are moist. Lungs: Clear bilaterally to auscultation without wheezes, rales, or rhonchi. Breathing is unlabored. Heart: RRR with normal S1, S2. No murmurs, rubs, or gallops appreciated. Abdomen: Soft, non-distended  Musculoskeletal:  Strength and tone appear normal for age. Lower extremities: No edema n/l lower extremities Neuro: Alert and oriented X 3. Moves all extremities spontaneously. Psych:  Responds to questions appropriately with a normal affect. Dialysis Access: AVG + t/b  No Known Allergies Prior to Admission medications   Medication Sig Start Date End Date Taking? Authorizing Provider  acetaminophen  (TYLENOL ) 500 MG tablet Take 1,000 mg by mouth 2 (two) times daily as needed for moderate pain (pain score 4-6), fever or headache.   Yes [provider]  calcitRIOL  (ROCALTROL ) 0.5 MCG capsule Take 0.5 mcg by mouth daily. Prior to dialysis on Tuesday, Thursday, Saturdays only 10/03/23  Yes [provider]  calcium  acetate (PHOSLO ) 667 MG capsule Take 1,334 mg by mouth 3 (three) times daily with meals.   Yes [provider]  diltiazem  (CARDIZEM  CD) 120 MG 24 hr capsule Take 1 capsule (120 mg total) by mouth every morning. 02/16/24   Yes Gherghe, Costin M, MD  furosemide  (LASIX ) 40 MG tablet Take 80 mg by mouth daily. 11/15/23  Yes [provider]  medroxyPROGESTERone  (PROVERA ) 10 MG tablet Take 2 tablets (20 mg total) by mouth daily for 9 days. 06/06/24 06/15/24 Yes Geiple, Joshua, PA-C  midodrine  (PROAMATINE ) 5 MG tablet Take 5 mg by mouth 3 (three) times daily with meals.   Yes [provider]  Vitamin D , Ergocalciferol , (DRISDOL ) 1.25 MG (50000 UNIT) CAPS capsule Take 1 capsule (50,000 Units total) by mouth every Thursday. 07/07/22  Yes Angiulli, Toribio PARAS, PA-C   No current facility-administered medications for this encounter.   Current Outpatient Medications  Medication Sig Dispense Refill   acetaminophen  (TYLENOL ) 500 MG tablet Take 1,000 mg by mouth 2 (two) times daily as needed for moderate pain (pain score 4-6), fever or headache.     calcitRIOL  (ROCALTROL ) 0.5 MCG capsule Take 0.5 mcg by mouth daily. Prior to dialysis on Tuesday, Thursday, Saturdays  only     calcium  acetate (PHOSLO ) 667 MG capsule Take 1,334 mg by mouth 3 (three) times daily with meals.     diltiazem  (CARDIZEM  CD) 120 MG 24 hr capsule Take 1 capsule (120 mg total) by mouth every morning. 90 capsule 0   furosemide  (LASIX ) 40 MG tablet Take 80 mg by mouth daily.     medroxyPROGESTERone  (PROVERA ) 10 MG tablet Take 2 tablets (20 mg total) by mouth daily for 9 days. 18 tablet 0   midodrine  (PROAMATINE ) 5 MG tablet Take 5 mg by mouth 3 (three) times daily with meals.     Vitamin D , Ergocalciferol , (DRISDOL ) 1.25 MG (50000 UNIT) CAPS capsule Take 1 capsule (50,000 Units total) by mouth every Thursday. 5 capsule 0   Labs: Basic Metabolic Panel: Recent Labs  Lab 06/05/24 0940 06/10/24 0921  NA 143 143  K 5.0 4.3  CL 102 103  CO2 25 25  GLUCOSE 101* 83  BUN 43* 33*  CREATININE 7.46* 6.45*  CALCIUM  8.9 9.3   Liver Function Tests: Recent Labs  Lab 06/10/24 0921  AST 22  ALT 17  ALKPHOS 53  BILITOT 0.5  PROT 6.6  ALBUMIN   3.8   No results for input(s): LIPASE, AMYLASE in the last 168 hours. No results for input(s): AMMONIA in the last 168 hours. CBC: Recent Labs  Lab 06/05/24 0940 06/10/24 0921  WBC 7.2 8.4  NEUTROABS 4.4 5.1  HGB 7.9* 5.3*  HCT 25.4* 17.1*  MCV 95.8 98.8  PLT 273 275   Cardiac Enzymes: No results for input(s): CKTOTAL, CKMB, CKMBINDEX, TROPONINI in the last 168 hours. CBG: No results for input(s): GLUCAP in the last 168 hours. Iron Studies: No results for input(s): IRON, TIBC, TRANSFERRIN, FERRITIN in the last 72 hours. Studies/Results: DG Chest 2 View Result Date: 06/10/2024 CLINICAL DATA:  Shortness of breath.  CHF. EXAM: CHEST - 2 VIEW COMPARISON:  Chest CT dated 05/11/2024 FINDINGS: No focal consolidation, pleural effusion or pneumothorax. The cardiac silhouette is within normal limits. No acute osseous pathology. IMPRESSION: No active cardiopulmonary disease. Electronically Signed   By: Vanetta Chou M.D.   On: 06/10/2024 10:22    Dialysis Orders:  Center: [facility]  on TTS . 180NRe 4 hours BFR 500 DFR Auto 1.5 EDW 75kg 3K 2.5Ca  Heparin  3000 unit bolus Mircera 200mcg IV q 2 weeks- given 150mxg on 06/04/24 Venofer 50mg  weekly AVG   Last Hgb 6.9 on 06/06/24  Assessment/Plan:  Acute symptomatic anemia: Due to vaginal bleeding. 2 units PRBC ordered, further management per admitting team. Will hold heparin  with dialysis.   ESRD:  On TTS schedule but due for dialysis today because of the holiday.   Hypertension/volume: BP moderately elevated. Does not appear volume overloaded but is getting PRBC today. UF 1.5L as tolerated with HD today.  Anemia: As above.  Not due for ESA dose yet.   Metabolic bone disease: Calcium  controlled, follow phos.   Nutrition:  Alb 3.8. Will need renal diet once tolerating PO  Lucie Collet, PA-C 06/10/2024, 2:26 PM  [facility] Pager: [phone]

## 2024-06-10 NOTE — ED Notes (Signed)
 Called [facility] for transport to [facility] ED @13 :19

## 2024-06-10 NOTE — ED Notes (Signed)
 Pt c/o pain in feet and legs, up to BR aware that she is going ER to ER dr Jerrol accepting

## 2024-06-10 NOTE — ED Triage Notes (Signed)
 Pt reports being seen last week for vaginal bleeding. Vaginal bleeding continues. CP and SOB for the past 3 days. BP low at diaylsis on Saturday 80's systolic. Advised to take her midodrine  Left side chest pain with breathing Feels like she is underwater Unable to get an appt with GYN

## 2024-06-10 NOTE — H&P (Addendum)
 History and Physical    PatientLukisha [name] FMW:[id number] DOB: 1981-01-09 DOA: 06/10/2024 DOS: the patient was seen and examined on 06/10/2024 PCP: [name], MD  Patient coming from: Home  Chief Complaint:  Chief Complaint  Patient presents with   Shortness of Breath   Vaginal Bleeding   HPI: Jacqueline [name] is a 43 y.o. female with medical history significant of anemia, ESRD on HD, HTN, DM2, and OSA p/w menorhagia c/b SOB/DOE and found to have symptomatic anemia with Hb 5.3 (previously 7.9 on 11/19 and 10.7 on 10/25).  The patient reported experiencing vaginal bleeding for a week and a half, which was unusual compared to the typical three-day duration. The bleeding was described as significant, resembling the size of golf balls and accompanied by bleeding. The patient experienced no pain but reported difficulty breathing, chest pain, and dizziness upon standing. The pt was evaluated at [facility] ED and noted to have endometrial polyps per transvaginal US  and sent home with 9 day prescription for Provera , which did not relieve her vaginal bleeding. She called to schedule an OP appointment with OB-GYN, but has failed to be evaluated and re-presented with ongoing vaginal bleeding and noted to have significant anemia.  In the ED, pt hypertensive, tachycardic, and tachypneic. Labs notable for Cr 6.45, proBNP 4761, and troponin 83. CXR neg fro acute disease. [facility] EDP requested ED to ED transfer for emergent transfusion.   Review of Systems: As mentioned in the history of present illness. All other systems reviewed and are negative. Past Medical History:  Diagnosis Date   Anemia    Anxiety    Chronic bronchitis (HCC)    Chronic kidney disease    Diabetes (HCC) 01/01/2014   Hypertension    Increased frequency of headaches    Morbid obesity (HCC)    Necrotizing fasciitis (HCC)    Sleep apnea    does not use CPAP - - gastric bypass   Type II diabetes mellitus (HCC)    no meds, diet  controlled   Past Surgical History:  Procedure Laterality Date   A/V SHUNT INTERVENTION N/A 11/17/2023   Procedure: A/V SHUNT INTERVENTION;  Surgeon: Tobie Gordy POUR, MD;  Location: [facility] INVASIVE CV LAB;  Service: Cardiovascular;  Laterality: N/A;  80% venous anastomosis   A/V SHUNT INTERVENTION Left 05/10/2024   Procedure: A/V SHUNT INTERVENTION;  Surgeon: Serene Gaile ORN, MD;  Location: [facility] PV LAB;  Service: Cardiovascular;  Laterality: Left;   AV FISTULA PLACEMENT Left 05/02/2023   Procedure: LEFT ARM ARTERIOVENOUS (AV) FISTULA  GRAFT;  Surgeon: Pearline Norman RAMAN, MD;  Location: [facility] OR;  Service: Vascular;  Laterality: Left;   BARIATRIC SURGERY     BREAST REDUCTION SURGERY  03/21/2017   CARDIAC CATHETERIZATION  01/06/2016   CARDIAC CATHETERIZATION N/A 01/06/2016   Procedure: Left Heart Cath and Coronary Angiography;  Surgeon: Deatrice DELENA Cage, MD;  Location: [facility] INVASIVE CV LAB;  Service: Cardiovascular;  Laterality: N/A;   CESAREAN SECTION  08/2014   I & D EXTREMITY Right 06/18/2022   Procedure: IRRIGATION AND DEBRIDEMENT RIGHT ABDOMEN AND THIGH;  Surgeon: Kinsinger, Herlene Righter, MD;  Location: [facility] OR;  Service: General;  Laterality: Right;   I & D EXTREMITY Right 06/20/2022   Procedure: WOUND EXPLORATION OF RIGHT THIGH AND RIGHT GROIN WITH IRRIGATION AND DEBRIDEMENT;  Surgeon: Curvin Deward MOULD, MD;  Location: [facility] OR;  Service: General;  Laterality: Right;   IR AV DIALY SHUNT INTRO NEEDLE/INTRACATH INITIAL W/PTA/IMG LEFT  10/19/2023   IR  AV DIALY SHUNT INTRO NEEDLE/INTRACATH INITIAL W/PTA/IMG LEFT  02/13/2024   IR DIALY SHUNT INTRO NEEDLE/INTRACATH INITIAL W/IMG LEFT Left 11/29/2023   IR FLUORO GUIDE CV LINE RIGHT  02/10/2024   IR FLUORO GUIDE CV LINE RIGHT  02/15/2024   IR US  GUIDE VASC ACCESS LEFT  02/13/2024   IR US  GUIDE VASC ACCESS RIGHT  02/10/2024   REDUCTION MAMMAPLASTY Bilateral 03/21/2017   TRANSESOPHAGEAL ECHOCARDIOGRAM (CATH LAB) N/A 10/16/2023   Procedure: TRANSESOPHAGEAL ECHOCARDIOGRAM;   Surgeon: Barbaraann Darryle Ned, MD;  Location: [facility] INVASIVE CV LAB;  Service: Cardiovascular;  Laterality: N/A;   VENOUS ANGIOPLASTY Left 05/10/2024   Procedure: VENOUS ANGIOPLASTY;  Surgeon: Serene Gaile ORN, MD;  Location: [facility] PV LAB;  Service: Cardiovascular;  Laterality: Left;  Venous Anastomosis   VENOUS STENT Left 05/10/2024   Procedure: VENOUS STENT;  Surgeon: Serene Gaile ORN, MD;  Location: [facility] PV LAB;  Service: Cardiovascular;  Laterality: Left;  8x10 Viabahn   Social History:  reports that she has never smoked. She has never used smokeless tobacco. She reports that she does not drink alcohol and does not use drugs.  No Known Allergies  Family History  Problem Relation Age of Onset   Diabetes Mother    Hypertension Mother    Thyroid  disease Mother    Kidney disease Maternal Grandmother    Diabetes Maternal Grandmother    Heart attack Other     Prior to Admission medications   Medication Sig Start Date End Date Taking? Authorizing Provider  acetaminophen  (TYLENOL ) 500 MG tablet Take 1,000 mg by mouth 2 (two) times daily as needed for moderate pain (pain score 4-6), fever or headache.   Yes [provider]  calcitRIOL  (ROCALTROL ) 0.5 MCG capsule Take 0.5 mcg by mouth daily. Prior to dialysis on Tuesday, Thursday, Saturdays only 10/03/23  Yes [provider]  calcium  acetate (PHOSLO ) 667 MG capsule Take 1,334 mg by mouth 3 (three) times daily with meals.   Yes [provider]  diltiazem  (CARDIZEM  CD) 120 MG 24 hr capsule Take 1 capsule (120 mg total) by mouth every morning. 02/16/24  Yes Gherghe, Costin M, MD  furosemide  (LASIX ) 40 MG tablet Take 80 mg by mouth daily. 11/15/23  Yes [provider]  medroxyPROGESTERone  (PROVERA ) 10 MG tablet Take 2 tablets (20 mg total) by mouth daily for 9 days. 06/06/24 06/15/24 Yes Geiple, Joshua, PA-C  midodrine  (PROAMATINE ) 5 MG tablet Take 5 mg by mouth 3 (three) times daily with meals.   Yes [provider]  Vitamin D , Ergocalciferol , (DRISDOL ) 1.25 MG (50000 UNIT) CAPS capsule Take 1 capsule (50,000 Units total) by mouth every Thursday. 07/07/22  Yes Pegge Toribio PARAS, PA-C    Physical Exam: Vitals:   06/10/24 9163 06/10/24 0839 06/10/24 0841 06/10/24 1247  BP: (!) 149/77   (!) 169/86  Resp: 13 20 13  (!) 21  Temp:  98.5 F (36.9 C)  98.9 F (37.2 C)  TempSrc:  Oral  Oral  SpO2:  100%    Weight: 74.4 kg      General: Alert, oriented x3, resting comfortably in no acute distress HEENT: EOMI, oropharynx clear, moist mucous membranes, hearing intact Neck: Trachea midline and no gross thyromegaly Respiratory: Lungs clear to auscultation bilaterally with normal respiratory effort; no w/r/r Cardiovascular: Regular rate and rhythm w/o m/r/g Abdomen: Soft, nontender, nondistended. Positive bowel sounds MSK: No obvious joint deformities or swelling Skin: No obvious rashes or lesions Neurologic: Awake, alert, spontaneously moves all extremities, strength intact Psychiatric: Appropriate  mood and affect, conversational and cooperative   Data Reviewed:  Lab Results  Component Value Date   WBC 8.4 06/10/2024   HGB 5.3 (LL) 06/10/2024   HCT 17.1 (L) 06/10/2024   MCV 98.8 06/10/2024   PLT 275 06/10/2024   Lab Results  Component Value Date   GLUCOSE 83 06/10/2024   CALCIUM  9.3 06/10/2024   NA 143 06/10/2024   K 4.3 06/10/2024   CO2 25 06/10/2024   CL 103 06/10/2024   BUN 33 (H) 06/10/2024   CREATININE 6.45 (H) 06/10/2024   Lab Results  Component Value Date   ALT 17 06/10/2024   AST 22 06/10/2024   ALKPHOS 53 06/10/2024   BILITOT 0.5 06/10/2024   Lab Results  Component Value Date   INR 1.0 03/13/2022   INR 1.1 08/17/2020   INR 1.0 02/03/2020   Radiology: DG Chest 2 View Result Date: 06/10/2024 CLINICAL DATA:  Shortness of breath.  CHF. EXAM: CHEST - 2 VIEW COMPARISON:  Chest CT dated 05/11/2024 FINDINGS: No focal consolidation, pleural effusion or  pneumothorax. The cardiac silhouette is within normal limits. No acute osseous pathology. IMPRESSION: No active cardiopulmonary disease. Electronically Signed   By: Vanetta Chou M.D.   On: 06/10/2024 10:22    Assessment and Plan: 41F h/o anemia, ESRD on HD, HTN, DM2, and OSA p/w menorhagia c/b SOB/DOE and found to have symptomatic anemia with Hb 5.3 (previously 7.9 on 11/19 and 10.7 on 10/25).  Symptomatic anemia Vaginal bleeding 2/2 endometrial polyps  Presumed anemia of chronic disease Pt with underlying ESRD c/b menorrhagia; Downtrending Hb for past two months; s/p short course of Provera  20mg  daily with no improvement in vaginal bleeding -OB-GYN on call MD consulted: recs: Megace  40mg  BID; re-consult in AM -Transfuse 2U PRBC as ordered -BMP q12h for now -F/u transvaginal US  -OP OB-GYN f/u at d/c  ESRD -Renal consulted; apprec eval/recs for HD -PTA calcitriol  0.5mcg daily -PTA Phoslo  1334mg  TID AC -PTA Midodrine  5mg  TID -PTA PO lasix  80mg  daily  HTN -PTA lasix  and Cardizem    Advance Care Planning:   Code Status: Prior   Consults: Renal  Family Communication: Spouse  Severity of Illness: The appropriate patient status for this patient is INPATIENT. Inpatient status is judged to be reasonable and necessary in order to provide the required intensity of service to ensure the patient's safety. The patient's presenting symptoms, physical exam findings, and initial radiographic and laboratory data in the context of their chronic comorbidities is felt to place them at high risk for further clinical deterioration. Furthermore, it is not anticipated that the patient will be medically stable for discharge from the hospital within 2 midnights of admission.   * I certify that at the point of admission it is my clinical judgment that the patient will require inpatient hospital care spanning beyond 2 midnights from the point of admission due to high intensity of service, high risk for  further deterioration and high frequency of surveillance required.*   ------- I spent 56 minutes reviewing previous notes, at the bedside counseling/discussing the treatment plan, and performing clinical documentation.  Author: Marsha Ada, MD 06/10/2024 2:48 PM  For on call review [email or web address].

## 2024-06-10 NOTE — Progress Notes (Signed)
 Pt receives out-pt HD at [facility] on TTS 5:35 am chair time. Will assist as needed.   Randine Mungo Dialysis Navigator [phone]

## 2024-06-10 NOTE — Plan of Care (Signed)
 43 year old ESRD patient on Tuesday Thursday Saturday dialysis schedule who has been having vaginal bleeding for several weeks, baseline hemoglobin of 8 presented to [facility] ER with exertional shortness of breath, hemoglobin found to be 5.5, creatinine 6.5, minimally elevated troponin with nonacute EKG.  Troponin high likely due to ESRD and demand ischemia from severe anemia.  Admitted to progressive care blood 2 units of packed RBC transfusion, potential early HD today nephrology Dr. Frankie informed as well.  Upon arrival let nephrology know, transfuse, likely involve OB as well.

## 2024-06-11 ENCOUNTER — Observation Stay (HOSPITAL_COMMUNITY)

## 2024-06-11 DIAGNOSIS — R188 Other ascites: Secondary | ICD-10-CM | POA: Diagnosis not present

## 2024-06-11 DIAGNOSIS — N939 Abnormal uterine and vaginal bleeding, unspecified: Secondary | ICD-10-CM | POA: Diagnosis not present

## 2024-06-11 DIAGNOSIS — D649 Anemia, unspecified: Secondary | ICD-10-CM | POA: Diagnosis not present

## 2024-06-11 DIAGNOSIS — N186 End stage renal disease: Secondary | ICD-10-CM | POA: Diagnosis not present

## 2024-06-11 DIAGNOSIS — N920 Excessive and frequent menstruation with regular cycle: Secondary | ICD-10-CM | POA: Diagnosis not present

## 2024-06-11 LAB — BPAM RBC
Blood Product Expiration Date: 202512052359
Blood Product Expiration Date: 202512072359
ISSUE DATE / TIME: 202511241713
ISSUE DATE / TIME: 202511242115
Unit Type and Rh: 7300
Unit Type and Rh: 7300

## 2024-06-11 LAB — TYPE AND SCREEN
ABO/RH(D): B POS
Antibody Screen: NEGATIVE
Unit division: 0
Unit division: 0

## 2024-06-11 LAB — GLUCOSE, CAPILLARY
Glucose-Capillary: 108 mg/dL — ABNORMAL HIGH (ref 70–99)
Glucose-Capillary: 113 mg/dL — ABNORMAL HIGH (ref 70–99)
Glucose-Capillary: 60 mg/dL — ABNORMAL LOW (ref 70–99)
Glucose-Capillary: 74 mg/dL (ref 70–99)
Glucose-Capillary: 78 mg/dL (ref 70–99)

## 2024-06-11 LAB — CBC
HCT: 23.6 % — ABNORMAL LOW (ref 36.0–46.0)
Hemoglobin: 8.1 g/dL — ABNORMAL LOW (ref 12.0–15.0)
MCH: 29.9 pg (ref 26.0–34.0)
MCHC: 34.3 g/dL (ref 30.0–36.0)
MCV: 87.1 fL (ref 80.0–100.0)
Platelets: 221 K/uL (ref 150–400)
RBC: 2.71 MIL/uL — ABNORMAL LOW (ref 3.87–5.11)
RDW: 18.7 % — ABNORMAL HIGH (ref 11.5–15.5)
WBC: 8.3 K/uL (ref 4.0–10.5)
nRBC: 0.2 % (ref 0.0–0.2)

## 2024-06-11 LAB — HEPATITIS B SURFACE ANTIGEN: Hepatitis B Surface Ag: NONREACTIVE

## 2024-06-11 MED ORDER — HYDROCODONE-ACETAMINOPHEN 5-325 MG PO TABS
1.0000 | ORAL_TABLET | ORAL | Status: AC | PRN
  Administered 2024-06-11 (×2): 1 via ORAL
  Filled 2024-06-11 (×2): qty 1

## 2024-06-11 MED ORDER — ACETAMINOPHEN 325 MG PO TABS
650.0000 mg | ORAL_TABLET | Freq: Four times a day (QID) | ORAL | Status: DC | PRN
Start: 2024-06-11 — End: 2024-06-11

## 2024-06-11 MED ORDER — HYDROCODONE-ACETAMINOPHEN 5-325 MG PO TABS
1.0000 | ORAL_TABLET | Freq: Four times a day (QID) | ORAL | Status: DC | PRN
  Administered 2024-06-11 – 2024-06-13 (×6): 1 via ORAL
  Filled 2024-06-11 (×6): qty 1

## 2024-06-11 MED ORDER — ACETAMINOPHEN 325 MG PO TABS
650.0000 mg | ORAL_TABLET | Freq: Four times a day (QID) | ORAL | Status: AC | PRN
  Administered 2024-06-11: 650 mg via ORAL
  Filled 2024-06-11: qty 2

## 2024-06-11 NOTE — Progress Notes (Addendum)
 Patient ID: Jacqueline [name], female   DOB: Nov 23, 1980, 43 y.o.   MRN: [id number] S: Still feels a little fatigued this morning.  Tolerated HD well and had 2 units PRBC's.  Hgb up appropriately.  Started on Megace  40 mg bid per OB/GYN recommendations and reports that the bleeding has stopped. O:BP (!) 148/80 (BP Location: Right Arm)   Pulse 90   Temp 98 F (36.7 C) (Axillary)   Resp 18   Ht 5' 4 (1.626 m)   Wt 72 kg   LMP 05/27/2024 (Approximate)   SpO2 100%   BMI 27.25 kg/m   Intake/Output Summary (Last 24 hours) at 06/11/2024 0913 Last data filed at 06/11/2024 0054 Gross per 24 hour  Intake 795 ml  Output 700 ml  Net 95 ml   Intake/Output: I/O last 3 completed shifts: In: 795 [P.O.:480; Blood:315] Out: 700 [Other:700]  Intake/Output this shift:  No intake/output data recorded. Weight change:  Gen: NAD CVS: RRR Resp:CTA Abd: +BS, soft, NT/ND Ext: no edema, LUE AVG +T/B  Recent Labs  Lab 06/05/24 0940 06/10/24 0921  NA 143 143  K 5.0 4.3  CL 102 103  CO2 25 25  GLUCOSE 101* 83  BUN 43* 33*  CREATININE 7.46* 6.45*  ALBUMIN   --  3.8  CALCIUM  8.9 9.3  AST  --  22  ALT  --  17   Liver Function Tests: Recent Labs  Lab 06/10/24 0921  AST 22  ALT 17  ALKPHOS 53  BILITOT 0.5  PROT 6.6  ALBUMIN  3.8   No results for input(s): LIPASE, AMYLASE in the last 168 hours. No results for input(s): AMMONIA in the last 168 hours. CBC: Recent Labs  Lab 06/05/24 0940 06/10/24 0921 06/10/24 1516 06/11/24 0232  WBC 7.2 8.4 8.2 8.3  NEUTROABS 4.4 5.1  --   --   HGB 7.9* 5.3* 5.2* 8.1*  HCT 25.4* 17.1* 17.4* 23.6*  MCV 95.8 98.8 100.0 87.1  PLT 273 275 265 221   Cardiac Enzymes: No results for input(s): CKTOTAL, CKMB, CKMBINDEX, TROPONINI in the last 168 hours. CBG: Recent Labs  Lab 06/11/24 0053 06/11/24 0614 06/11/24 0655  GLUCAP 113* 60* 74    Iron Studies: No results for input(s): IRON, TIBC, TRANSFERRIN, FERRITIN in the last 72  hours. Studies/Results: DG Chest 2 View Result Date: 06/10/2024 CLINICAL DATA:  Shortness of breath.  CHF. EXAM: CHEST - 2 VIEW COMPARISON:  Chest CT dated 05/11/2024 FINDINGS: No focal consolidation, pleural effusion or pneumothorax. The cardiac silhouette is within normal limits. No acute osseous pathology. IMPRESSION: No active cardiopulmonary disease. Electronically Signed   By: Vanetta Chou M.D.   On: 06/10/2024 10:22    calcitRIOL   0.5 mcg Oral Daily   calcium  acetate  1,334 mg Oral TID WC   Chlorhexidine  Gluconate Cloth  6 each Topical Q0600   diltiazem   120 mg Oral q morning   furosemide   80 mg Oral Daily   megestrol   40 mg Oral BID   midodrine   5 mg Oral TID WC    BMET    Component Value Date/Time   NA 143 06/10/2024 0921   K 4.3 06/10/2024 0921   CL 103 06/10/2024 0921   CO2 25 06/10/2024 0921   GLUCOSE 83 06/10/2024 0921   BUN 33 (H) 06/10/2024 0921   CREATININE 6.45 (H) 06/10/2024 0921   CALCIUM  9.3 06/10/2024 0921   GFRNONAA 8 (L) 06/10/2024 0921   GFRAA 31 (L) 12/03/2019 0345   CBC  Component Value Date/Time   WBC 8.3 06/11/2024 0232   RBC 2.71 (L) 06/11/2024 0232   HGB 8.1 (L) 06/11/2024 0232   HCT 23.6 (L) 06/11/2024 0232   PLT 221 06/11/2024 0232   MCV 87.1 06/11/2024 0232   MCH 29.9 06/11/2024 0232   MCHC 34.3 06/11/2024 0232   RDW 18.7 (H) 06/11/2024 0232   LYMPHSABS 2.3 06/10/2024 0921   MONOABS 0.6 06/10/2024 0921   EOSABS 0.3 06/10/2024 0921   BASOSABS 0.1 06/10/2024 9078    Dialysis Orders:  Center: [facility]  on TTS . 180NRe 4 hours BFR 500 DFR Auto 1.5 EDW 75kg 3K 2.5Ca  Heparin  3000 unit bolus Mircera 200mcg IV q 2 weeks- given 150mxg on 06/04/24 Venofer 50mg  weekly AVG    Last Hgb 6.9 on 06/06/24   Assessment/Plan:  Acute symptomatic anemia: Due to vaginal bleeding. 2 units PRBC given yesterday with HD and appropriate rise in Hgb.  Per Ob/Gyn, started on megace  40 mg bid and reports that the vaginal bleeding has stopped.   To have outpt follow up with Ob/Gyn arranged.  Further management per admitting team. Will hold heparin  with dialysis. Continue to follow H/H.  ESRD:  On TTS schedule but due for dialysis tomorrow because of the holiday which is Monday, Wednesday, Saturday.     Hypertension/volume: BP improved. Does not appear volume overloaded but is getting PRBC today. UF 1.5L as tolerated with HD yesterday.  Anemia: As above.  Not due for ESA dose yet.  S/p blood transfusion with 2 units PRBC's.  Continue to follow H/H and transfuse prn Hgb <7.  Metabolic bone disease: Calcium  controlled, follow phos.   Nutrition:  Alb 3.8. Will need renal diet once tolerating PO  Fairy RONAL Sellar, MD [facility]

## 2024-06-11 NOTE — Consult Note (Addendum)
 OB/GYN Consult Note  Referring Provider: Darcel Leotis Wheeler [name] is a 43 y.o. G1P1 admitted for heavy vaginal bleeding leading to anemia. Gyn consulted for discussion of ultrasound findings and potential treatment options.   Pt notes history of regular menses usually lasting 4 days.  Recent menses has lasted 7 days with heavier bleeding and passage of clots.  Pt was initially anemic to a level of 5.3.  She had been seen at outpatient facility with an ultrasound revealing potential endometrial polyp.  She was initially prescribed provera  for 9 days which was largely ineffective.  Pt was admitted to receive 2 units of blood.  She was also converted to daily megace  which seems to have been partially effective.  Pt notes bleeding is currently fairly light. Pt's care is complicated by three times a week of dialysis.  Pt has ESRD as a result of previous diabetes which has now resolved with weight loss.      Past Medical History:  Diagnosis Date   Anemia    Anxiety    Chronic bronchitis (HCC)    Chronic kidney disease    Diabetes (HCC) 01/01/2014   Hypertension    Increased frequency of headaches    Morbid obesity (HCC)    Necrotizing fasciitis (HCC)    Sleep apnea    does not use CPAP - - gastric bypass   Type II diabetes mellitus (HCC)    no meds, diet controlled    Past Surgical History:  Procedure Laterality Date   A/V SHUNT INTERVENTION N/A 11/17/2023   Procedure: A/V SHUNT INTERVENTION;  Surgeon: Tobie Gordy POUR, MD;  Location: [facility] INVASIVE CV LAB;  Service: Cardiovascular;  Laterality: N/A;  80% venous anastomosis   A/V SHUNT INTERVENTION Left 05/10/2024   Procedure: A/V SHUNT INTERVENTION;  Surgeon: Serene Gaile ORN, MD;  Location: [facility] PV LAB;  Service: Cardiovascular;  Laterality: Left;   AV FISTULA PLACEMENT Left 05/02/2023   Procedure: LEFT ARM ARTERIOVENOUS (AV) FISTULA  GRAFT;  Surgeon: Pearline Norman RAMAN, MD;  Location: [facility];  Service: Vascular;  Laterality: Left;    BARIATRIC SURGERY     BREAST REDUCTION SURGERY  03/21/2017   CARDIAC CATHETERIZATION  01/06/2016   CARDIAC CATHETERIZATION N/A 01/06/2016   Procedure: Left Heart Cath and Coronary Angiography;  Surgeon: Deatrice DELENA Cage, MD;  Location: [facility] INVASIVE CV LAB;  Service: Cardiovascular;  Laterality: N/A;   CESAREAN SECTION  08/2014   I & D EXTREMITY Right 06/18/2022   Procedure: IRRIGATION AND DEBRIDEMENT RIGHT ABDOMEN AND THIGH;  Surgeon: Kinsinger, Herlene Righter, MD;  Location: [facility] OR;  Service: General;  Laterality: Right;   I & D EXTREMITY Right 06/20/2022   Procedure: WOUND EXPLORATION OF RIGHT THIGH AND RIGHT GROIN WITH IRRIGATION AND DEBRIDEMENT;  Surgeon: Curvin Deward MOULD, MD;  Location: [facility] OR;  Service: General;  Laterality: Right;   IR AV DIALY SHUNT INTRO NEEDLE/INTRACATH INITIAL W/PTA/IMG LEFT  10/19/2023   IR AV DIALY SHUNT INTRO NEEDLE/INTRACATH INITIAL W/PTA/IMG LEFT  02/13/2024   IR DIALY SHUNT INTRO NEEDLE/INTRACATH INITIAL W/IMG LEFT Left 11/29/2023   IR FLUORO GUIDE CV LINE RIGHT  02/10/2024   IR FLUORO GUIDE CV LINE RIGHT  02/15/2024   IR US  GUIDE VASC ACCESS LEFT  02/13/2024   IR US  GUIDE VASC ACCESS RIGHT  02/10/2024   REDUCTION MAMMAPLASTY Bilateral 03/21/2017   TRANSESOPHAGEAL ECHOCARDIOGRAM (CATH LAB) N/A 10/16/2023   Procedure: TRANSESOPHAGEAL ECHOCARDIOGRAM;  Surgeon: Barbaraann Darryle Ned, MD;  Location: [facility] INVASIVE CV LAB;  Service: Cardiovascular;  Laterality: N/A;   VENOUS ANGIOPLASTY Left 05/10/2024   Procedure: VENOUS ANGIOPLASTY;  Surgeon: Serene Gaile ORN, MD;  Location: [facility];  Service: Cardiovascular;  Laterality: Left;  Venous Anastomosis   VENOUS STENT Left 05/10/2024   Procedure: VENOUS STENT;  Surgeon: Serene Gaile ORN, MD;  Location: [facility];  Service: Cardiovascular;  Laterality: Left;  8x10 Viabahn    OB History  Gravida Para Term Preterm AB Living  1       SAB IAB Ectopic Multiple Live Births          # Outcome Date GA Lbr Len/2nd Weight Sex Type Anes PTL  Lv  1 Gravida              Birth Comments: System Generated. Please review and update pregnancy details.    Social History   Socioeconomic History   Marital status: Married    Spouse name: Not on file   Number of children: Not on file   Years of education: Not on file   Highest education level: Not on file  Occupational History   Not on file  Tobacco Use   Smoking status: Never   Smokeless tobacco: Never  Vaping Use   Vaping status: Never Used  Substance and Sexual Activity   Alcohol use: No   Drug use: No   Sexual activity: Yes    Birth control/protection: None  Other Topics Concern   Not on file  Social History Narrative   Not on file   Social Drivers of Health   Financial Resource Strain: Not on file  Food Insecurity: No Food Insecurity (02/08/2024)   Hunger Vital Sign    Worried About Running Out of Food in the Last Year: Never true    Ran Out of Food in the Last Year: Never true  Transportation Needs: No Transportation Needs (02/08/2024)   PRAPARE - Administrator, Civil Service (Medical): No    Lack of Transportation (Non-Medical): No  Physical Activity: Not on file  Stress: Not on file  Social Connections: Socially Integrated (10/11/2023)   Social Connection and Isolation Panel    Frequency of Communication with Friends and Family: More than three times a week    Frequency of Social Gatherings with Friends and Family: More than three times a week    Attends Religious Services: 1 to 4 times per year    Active Member of [employer] or Organizations: Yes    Attends Engineer, Structural: More than 4 times per year    Marital Status: Married    Family History  Problem Relation Age of Onset   Diabetes Mother    Hypertension Mother    Thyroid  disease Mother    Kidney disease Maternal Grandmother    Diabetes Maternal Grandmother    Heart attack Other     Medications Prior to Admission  Medication Sig Dispense Refill Last Dose/Taking    acetaminophen  (TYLENOL ) 500 MG tablet Take 1,000 mg by mouth 2 (two) times daily as needed for moderate pain (pain score 4-6), fever or headache.   Unknown   calcitRIOL  (ROCALTROL ) 0.5 MCG capsule Take 0.5 mcg by mouth daily. Prior to dialysis on Tuesday, Thursday, Saturdays only   06/10/2024 Morning   calcium  acetate (PHOSLO ) 667 MG capsule Take 1,334 mg by mouth 3 (three) times daily with meals.   06/10/2024 Morning   diltiazem  (CARDIZEM  CD) 120 MG 24 hr capsule Take 1 capsule (120 mg total) by mouth every morning. 90 capsule  0 06/10/2024 Morning   furosemide  (LASIX ) 40 MG tablet Take 80 mg by mouth daily.   06/10/2024 Morning   medroxyPROGESTERone  (PROVERA ) 10 MG tablet Take 2 tablets (20 mg total) by mouth daily for 9 days. 18 tablet 0 06/10/2024 Morning   midodrine  (PROAMATINE ) 5 MG tablet Take 5 mg by mouth 3 (three) times daily with meals.   06/10/2024   Vitamin D , Ergocalciferol , (DRISDOL ) 1.25 MG (50000 UNIT) CAPS capsule Take 1 capsule (50,000 Units total) by mouth every Thursday. 5 capsule 0 Past Week    No Known Allergies  Review of Systems: Negative except for what is mentioned in HPI.     Physical Exam: BP (!) 145/75 (BP Location: Right Arm)   Pulse 90   Temp 98 F (36.7 C) (Axillary)   Resp 18   Ht 5' 4 (1.626 m)   Wt 72 kg   LMP 05/27/2024 (Approximate)   SpO2 100%   BMI 27.25 kg/m  CONSTITUTIONAL: Well-developed, well-nourished female in no acute distress.  HENT:  Normocephalic, atraumatic, External right and left ear normal. Oropharynx is clear and moist EYES: Conjunctivae and EOM are normal.  NECK: Normal range of motion, supple, no masses SKIN: Skin is warm and dry. No rash noted. Not diaphoretic. No erythema. No pallor. NEUROLGIC: Alert and oriented to person, place, and time. Normal reflexes, muscle tone coordination. No cranial nerve deficit noted. PSYCHIATRIC: Normal mood and affect. Normal behavior. Normal judgment and thought content. CARDIOVASCULAR:  Normal heart rate noted, regular rhythm RESPIRATORY: Effort and breath sounds normal, no problems with respiration noted ABDOMEN: Soft, nontender, nondistended. Well-healed Pfannenstiel incision. PELVIC: deferred MUSCULOSKELETAL: Normal range of motion. No edema and no tenderness. 2+ distal pulses.   Pertinent Labs/Studies:   Results for orders placed or performed during the hospital encounter of 06/10/24 (from the past 72 hours)  CBC with Differential     Status: Abnormal   Collection Time: 06/10/24  9:21 AM  Result Value Ref Range   WBC 8.4 4.0 - 10.5 K/uL   RBC 1.73 (L) 3.87 - 5.11 MIL/uL   Hemoglobin 5.3 (LL) 12.0 - 15.0 g/dL    Comment: This critical result has been called to robin clement by Alan Knee on 06/10/2024 10:09:19, and has been read back.   HCT 17.1 (L) 36.0 - 46.0 %   MCV 98.8 80.0 - 100.0 fL   MCH 30.6 26.0 - 34.0 pg   MCHC 31.0 30.0 - 36.0 g/dL   RDW 82.3 (H) 88.4 - 84.4 %   Platelets 275 150 - 400 K/uL   nRBC 0.4 (H) 0.0 - 0.2 %   Neutrophils Relative % 59 %   Neutro Abs 5.1 1.7 - 7.7 K/uL   Lymphocytes Relative 28 %   Lymphs Abs 2.3 0.7 - 4.0 K/uL   Monocytes Relative 8 %   Monocytes Absolute 0.6 0.1 - 1.0 K/uL   Eosinophils Relative 4 %   Eosinophils Absolute 0.3 0.0 - 0.5 K/uL   Basophils Relative 1 %   Basophils Absolute 0.1 0.0 - 0.1 K/uL   Immature Granulocytes 0 %   Abs Immature Granulocytes 0.02 0.00 - 0.07 K/uL    Comment: Performed at [facility], [address] [place], [place] [address]  Brain natriuretic peptide     Status: Abnormal   Collection Time: 06/10/24  9:21 AM  Result Value Ref Range   Pro Brain Natriuretic Peptide 4,761.0 (H) <300.0 pg/mL    Comment: (NOTE) Age Group  Cut-Points    Interpretation  < 50 years     450 pg/mL       NT-proBNP > 450 pg/mL indicates                                ADHF is likely              50 to 75 years  900 pg/mL      NT-proBNP > 900 pg/mL indicates          ADHF is  likely  > 75 years      1800 pg/mL     NT-proBNP > 1800 pg/mL indicates          ADHF is likely                           All ages    Results between       Indeterminate. Further clinical             300 and the cut-   information is needed to determine            point for age group   if ADHF is present.                                                             Elecsys proBNP II/ Elecsys proBNP II STAT           Cut-Point                       Interpretation  300 pg/mL                    NT-proBNP <300pg/mL indicates                             ADHF is not likely  Performed at [facility], [address] [place], [place] [address]   Resp panel by RT-PCR (RSV, Flu A&B, Covid) Anterior Nasal Swab     Status: None   Collection Time: 06/10/24  9:21 AM   Specimen: Anterior Nasal Swab  Result Value Ref Range   SARS Coronavirus 2 by RT PCR NEGATIVE NEGATIVE    Comment: (NOTE) SARS-CoV-2 target nucleic acids are NOT DETECTED.  The SARS-CoV-2 RNA is generally detectable in upper respiratory specimens during the acute phase of infection. The lowest concentration of SARS-CoV-2 viral copies this assay can detect is 138 copies/mL. A negative result does not preclude SARS-Cov-2 infection and should not be used as the sole basis for treatment or other patient management decisions. A negative result may occur with  improper specimen collection/handling, submission of specimen other than nasopharyngeal swab, presence of viral mutation(s) within the areas targeted by this assay, and inadequate number of viral copies(<138 copies/mL). A negative result must be combined with clinical observations, patient history, and epidemiological information. The expected result is Negative.  Fact Sheet for Patients:  bloggercourse.com  Fact Sheet for Healthcare Providers:  seriousbroker.it  This test is no t yet approved or cleared by  the [place]  FDA and  has been authorized for  detection and/or diagnosis of SARS-CoV-2 by FDA under an Emergency Use Authorization (EUA). This EUA will remain  in effect (meaning this test can be used) for the duration of the COVID-19 declaration under Section 564(b)(1) of the Act, 21 U.S.C.section 360bbb-3(b)(1), unless the authorization is terminated  or revoked sooner.       Influenza A by PCR NEGATIVE NEGATIVE   Influenza B by PCR NEGATIVE NEGATIVE    Comment: (NOTE) The Xpert Xpress SARS-CoV-2/FLU/RSV plus assay is intended as an aid in the diagnosis of influenza from Nasopharyngeal swab specimens and should not be used as a sole basis for treatment. Nasal washings and aspirates are unacceptable for Xpert Xpress SARS-CoV-2/FLU/RSV testing.  Fact Sheet for Patients: bloggercourse.com  Fact Sheet for Healthcare Providers: seriousbroker.it  This test is not yet approved or cleared by the [place]  FDA and has been authorized for detection and/or diagnosis of SARS-CoV-2 by FDA under an Emergency Use Authorization (EUA). This EUA will remain in effect (meaning this test can be used) for the duration of the COVID-19 declaration under Section 564(b)(1) of the Act, 21 U.S.C. section 360bbb-3(b)(1), unless the authorization is terminated or revoked.     Resp Syncytial Virus by PCR NEGATIVE NEGATIVE    Comment: (NOTE) Fact Sheet for Patients: bloggercourse.com  Fact Sheet for Healthcare Providers: seriousbroker.it  This test is not yet approved or cleared by the [place]  FDA and has been authorized for detection and/or diagnosis of SARS-CoV-2 by FDA under an Emergency Use Authorization (EUA). This EUA will remain in effect (meaning this test can be used) for the duration of the COVID-19 declaration under Section 564(b)(1) of the Act, 21 U.S.C. section  360bbb-3(b)(1), unless the authorization is terminated or revoked.  Performed at [facility], [address] [place], [place] [address]   Troponin T, High Sensitivity     Status: Abnormal   Collection Time: 06/10/24  9:21 AM  Result Value Ref Range   Troponin T High Sensitivity 83 (H) 0 - 19 ng/L    Comment: (NOTE) Biotin concentrations > 1000 ng/mL falsely decrease TnT results.  Serial cardiac troponin measurements are suggested.  Refer to the Links section for chest pain algorithms and additional  guidance. Performed at [facility], [address] [place], [place] [address]   Comprehensive metabolic panel     Status: Abnormal   Collection Time: 06/10/24  9:21 AM  Result Value Ref Range   Sodium 143 135 - 145 mmol/L   Potassium 4.3 3.5 - 5.1 mmol/L   Chloride 103 98 - 111 mmol/L   CO2 25 22 - 32 mmol/L   Glucose, Bld 83 70 - 99 mg/dL    Comment: Glucose reference range applies only to samples taken after fasting for at least 8 hours.   BUN 33 (H) 6 - 20 mg/dL   Creatinine, Ser 3.54 (H) 0.44 - 1.00 mg/dL   Calcium  9.3 8.9 - 10.3 mg/dL   Total Protein 6.6 6.5 - 8.1 g/dL   Albumin  3.8 3.5 - 5.0 g/dL   AST 22 15 - 41 U/L   ALT 17 0 - 44 U/L   Alkaline Phosphatase 53 38 - 126 U/L   Total Bilirubin 0.5 0.0 - 1.2 mg/dL   GFR, Estimated 8 (L) >60 mL/min    Comment: (NOTE) Calculated using the CKD-EPI Creatinine Equation (2021)    Anion gap 16 (H) 5 - 15    Comment: Performed at [facility], [address] [place], [place] [address]  Prepare RBC (crossmatch)     Status: None   Collection Time: 06/10/24  3:01 PM  Result Value Ref Range   Order Confirmation      ORDER PROCESSED BY BLOOD BANK Performed at [facility], [address], [place], [place] [address]   Type and screen [facility]     Status: None   Collection Time: 06/10/24  3:05 PM  Result Value Ref Range   ABO/RH(D) B POS    Antibody Screen NEG     Sample Expiration 06/13/2024,2359    Unit Number [id number]    Blood Component Type RED CELLS,LR    Unit division 00    Status of Unit ISSUED,FINAL    Transfusion Status OK TO TRANSFUSE    Crossmatch Result Compatible    Unit Number [id number]    Blood Component Type RED CELLS,LR    Unit division 00    Status of Unit ISSUED,FINAL    Transfusion Status OK TO TRANSFUSE    Crossmatch Result      Compatible Performed at [facility], [address] [place], [place] [address]   ABO/Rh     Status: None   Collection Time: 06/10/24  3:06 PM  Result Value Ref Range   ABO/RH(D)      B POS Performed at [facility], [address] [place], [place] [address]   CBC     Status: Abnormal   Collection Time: 06/10/24  3:16 PM  Result Value Ref Range   WBC 8.2 4.0 - 10.5 K/uL   RBC 1.74 (L) 3.87 - 5.11 MIL/uL   Hemoglobin 5.2 (LL) 12.0 - 15.0 g/dL    Comment: REPEATED TO VERIFY This critical result has been called to JENNA PERKINS,RN by Haze Balding on 06/10/2024 15:28:29, and has been read back.    HCT 17.4 (L) 36.0 - 46.0 %   MCV 100.0 80.0 - 100.0 fL   MCH 29.9 26.0 - 34.0 pg   MCHC 29.9 (L) 30.0 - 36.0 g/dL   RDW 82.7 (H) 88.4 - 84.4 %   Platelets 265 150 - 400 K/uL   nRBC 0.2 0.0 - 0.2 %    Comment: Performed at [facility], [address] [place], [place] [address]  Glucose, capillary     Status: Abnormal   Collection Time: 06/11/24 12:53 AM  Result Value Ref Range   Glucose-Capillary 113 (H) 70 - 99 mg/dL    Comment: Glucose reference range applies only to samples taken after fasting for at least 8 hours.  CBC     Status: Abnormal   Collection Time: 06/11/24  2:32 AM  Result Value Ref Range   WBC 8.3 4.0 - 10.5 K/uL   RBC 2.71 (L) 3.87 - 5.11 MIL/uL   Hemoglobin 8.1 (L) 12.0 - 15.0 g/dL    Comment: REPEATED TO VERIFY POST TRANSFUSION SPECIMEN    HCT 23.6 (L) 36.0 - 46.0 %   MCV 87.1 80.0 - 100.0 fL    Comment: DELTA CHECK NOTED   MCH 29.9 26.0  - 34.0 pg   MCHC 34.3 30.0 - 36.0 g/dL   RDW 81.2 (H) 88.4 - 84.4 %   Platelets 221 150 - 400 K/uL   nRBC 0.2 0.0 - 0.2 %    Comment: Performed at [facility], [address], [place], [place] [address]  Glucose, capillary     Status: Abnormal  Collection Time: 06/11/24  6:14 AM  Result Value Ref Range   Glucose-Capillary 60 (L) 70 - 99 mg/dL    Comment: Glucose reference range applies only to samples taken after fasting for at least 8 hours.  Glucose, capillary     Status: None   Collection Time: 06/11/24  6:55 AM  Result Value Ref Range   Glucose-Capillary 74 70 - 99 mg/dL    Comment: Glucose reference range applies only to samples taken after fasting for at least 8 hours.  Hepatitis B surface antigen     Status: None   Collection Time: 06/11/24  9:54 AM  Result Value Ref Range   Hepatitis B Surface Ag NON REACTIVE NON REACTIVE    Comment: Performed at [facility], [address] [place], [place] [address]  Glucose, capillary     Status: None   Collection Time: 06/11/24 12:51 PM  Result Value Ref Range   Glucose-Capillary 78 70 - 99 mg/dL    Comment: Glucose reference range applies only to samples taken after fasting for at least 8 hours.       Assessment and Plan :Jacqueline [name] is a 43 y.o. G1P0 admitted for  anemia secondary to menorrhagia.  Recommendations: Continue megace  40 mg po BID until procedure is completed. Can increase to 80 mg po BID if needed for bleeding control. Follow up on repeated ultrasound. Try to schedule hysteroscopy D and C/polyp removal on a day that pt is not receiving dialysis. Possibly place progesterone IUD either intra-op or postop to continually address menstrual bleeding without need for oral medication. Will discuss with primary attending regarding timing.   Thank you for this consult, we will follow along.  For OB/GYN questions, please call the Center for [facility] at [facility] Monday -  Friday, 8 am - 5 pm: [phone] All other times: [phone]    Jerilynn Buddle, MD  Attending Obstetrician & Gynecologist, [facility] for [facility], [facility]

## 2024-06-11 NOTE — Progress Notes (Addendum)
 PROGRESS NOTE    Jacqueline [name]  FMW:[id number] DOB: 1981/05/26 DOA: 06/10/2024 PCP: [name], MD   Brief Narrative:  This 43 year old female with PMH significant for anemia, ESRD on hemodialysis, hypertension, type 2 diabetes, OSA presented with menorrhagia complicated by shortness of breath and dyspnea on exertion and found to have symptomatic anemia with hemoglobin of 5.3(baseline hemoglobin 7.9- 8) Patient reports having vaginal bleeding for a week which is unusual compared to her typical 3-day duration.  She described bleeding as significant resembling the size of a golf ball and accompanied by bleeding.  Patient reports difficulty breathing, chest pain and dizziness upon standing.  She was evaluated in the [facility], ED and noted to have endometrial polyps on transvaginal ultrasound and was sent home with a prescription of Provera  which did not relieve her vagina bleeding.  She called her outpatient OB/GYN and failed to get appointment.  She presented in the ED with ongoing vaginal bleeding and significant anemia.  Hemoglobin on arrival 5.3.  Status post 2 unit PRBC hemoglobin improved to 8.0.  GYN is consulted, Nephrology is consulted.  Assessment & Plan:   Principal Problem:   Symptomatic anemia  Symptomatic anemia: Vaginal  bleeding secondary to endometrial polyps: Anemia of chronic disease: Patient with underlying ESRD presented with menorrhagia. Hemoglobin downtrending for last 2 months. Hb 5.3 on arrival. Status post short course of Provera  20 mg daily with no improvement in vaginal bleeding. Discussed with on-call OB/GYN.  Megace  was started 40 mg twice daily. Status post 2 unit PRBC during hemodialysis.  Hemoglobin improved to 8.1. Follow-up transvaginal ultrasound. Needs outpatient OB/GYN follow-up at discharge. Monitor H&H.  ESRD: Renal consulted.  Underwent hemodialysis. Continue calcitriol  0.5 mcg daily Continue PhosLo  1334 mg 3 times daily Continue  midodrine  5 mg 3 times daily Continue Lasix  80 mg p.o. daily. Continue dialysis as per nephrology.  Essential hypertension: Continue Lasix  and Cardizem .  Elevated Pro BNP: This could be secondary to fluid overload but patient does not appear volume overloaded.  Elevated troponin: This could be due to demand ischemia in the setting of menorrhagia. Patient denies any chest pain.  EKG sinus tachycardia.  DVT prophylaxis: SCDs Code Status: Full code Family Communication: No family at bed side. Disposition Plan:    Status is: Observation The patient remains OBS appropriate and will d/c before 2 midnights.   Admitted for menorrhagia,  GYN is consulted.  Nephrology consulted for ESRD.  Consultants:  Nephrology Gynecology  Procedures: Transvaginal ultrasound. Antimicrobials:  Anti-infectives (From admission, onward)    None      Subjective: Patient was seen and examined at bedside.  Overnight events noted. Patient reports bleeding has stopped.  She feels sluggish today,  Hemoglobin has improved after 2 unit PRBC.  Objective: Vitals:   06/11/24 0016 06/11/24 0305 06/11/24 0756 06/11/24 1520  BP: (!) 153/76 124/70 (!) 148/80 (!) 145/75  Pulse: 93 91 90   Resp: 20 16 18 18   Temp: 98.7 F (37.1 C) 98.7 F (37.1 C) 98 F (36.7 C) 98 F (36.7 C)  TempSrc: Oral Oral Axillary Axillary  SpO2: 90% 100% 100%   Weight: 72 kg     Height: 5' 4 (1.626 m)       Intake/Output Summary (Last 24 hours) at 06/11/2024 1537 Last data filed at 06/11/2024 0054 Gross per 24 hour  Intake 795 ml  Output 700 ml  Net 95 ml   Filed Weights   06/10/24 0836 06/10/24 2245 06/11/24 0016  Weight: 74.4 kg  73.7 kg 72 kg    Examination:  General exam: Appears calm and comfortable, not in any acute distress. Respiratory system: Clear to auscultation. Respiratory effort normal. RR 12 Cardiovascular system: S1 & S2 heard, RRR. No JVD, murmurs, rubs, gallops or clicks.  Gastrointestinal  system: Abdomen is non distended, soft and non tender. Normal bowel sounds heard. Central nervous system: Alert and oriented X 3. No focal neurological deficits. Extremities: No edema, no cyanosis, no clubbing. Skin: No rashes, lesions or ulcers Psychiatry: Judgement and insight appear normal. Mood & affect appropriate.    Data Reviewed: I have personally reviewed following labs and imaging studies  CBC: Recent Labs  Lab 06/05/24 0940 06/10/24 0921 06/10/24 1516 06/11/24 0232  WBC 7.2 8.4 8.2 8.3  NEUTROABS 4.4 5.1  --   --   HGB 7.9* 5.3* 5.2* 8.1*  HCT 25.4* 17.1* 17.4* 23.6*  MCV 95.8 98.8 100.0 87.1  PLT 273 275 265 221   Basic Metabolic Panel: Recent Labs  Lab 06/05/24 0940 06/10/24 0921  NA 143 143  K 5.0 4.3  CL 102 103  CO2 25 25  GLUCOSE 101* 83  BUN 43* 33*  CREATININE 7.46* 6.45*  CALCIUM  8.9 9.3   GFR: Estimated Creatinine Clearance: 10.8 mL/min (A) (by C-G formula based on SCr of 6.45 mg/dL (H)). Liver Function Tests: Recent Labs  Lab 06/10/24 0921  AST 22  ALT 17  ALKPHOS 53  BILITOT 0.5  PROT 6.6  ALBUMIN  3.8   No results for input(s): LIPASE, AMYLASE in the last 168 hours. No results for input(s): AMMONIA in the last 168 hours. Coagulation Profile: No results for input(s): INR, PROTIME in the last 168 hours. Cardiac Enzymes: No results for input(s): CKTOTAL, CKMB, CKMBINDEX, TROPONINI in the last 168 hours. BNP (last 3 results) Recent Labs    06/10/24 0921  PROBNP 4,761.0*   HbA1C: No results for input(s): HGBA1C in the last 72 hours. CBG: Recent Labs  Lab 06/11/24 0053 06/11/24 0614 06/11/24 0655 06/11/24 1251  GLUCAP 113* 60* 74 78   Lipid Profile: No results for input(s): CHOL, HDL, LDLCALC, TRIG, CHOLHDL, LDLDIRECT in the last 72 hours. Thyroid  Function Tests: No results for input(s): TSH, T4TOTAL, FREET4, T3FREE, THYROIDAB in the last 72 hours. Anemia Panel: No results for  input(s): VITAMINB12, FOLATE, FERRITIN, TIBC, IRON, RETICCTPCT in the last 72 hours. Sepsis Labs: No results for input(s): PROCALCITON, LATICACIDVEN in the last 168 hours.  Recent Results (from the past 240 hours)  Wet prep, genital     Status: None   Collection Time: 06/05/24 11:31 AM   Specimen: PATH Cytology Cervicovaginal Ancillary Only  Result Value Ref Range Status   Yeast Wet Prep HPF POC NONE SEEN NONE SEEN Final   Trich, Wet Prep NONE SEEN NONE SEEN Final   Clue Cells Wet Prep HPF POC NONE SEEN NONE SEEN Final   WBC, Wet Prep HPF POC <10 <10 Final   Sperm NONE SEEN  Final    Comment: Performed at [facility], [address] [place], [place] [address]  Resp panel by RT-PCR (RSV, Flu A&B, Covid) Anterior Nasal Swab     Status: None   Collection Time: 06/10/24  9:21 AM   Specimen: Anterior Nasal Swab  Result Value Ref Range Status   SARS Coronavirus 2 by RT PCR NEGATIVE NEGATIVE Final    Comment: (NOTE) SARS-CoV-2 target nucleic acids are NOT DETECTED.  The SARS-CoV-2 RNA is generally detectable in upper respiratory specimens during  the acute phase of infection. The lowest concentration of SARS-CoV-2 viral copies this assay can detect is 138 copies/mL. A negative result does not preclude SARS-Cov-2 infection and should not be used as the sole basis for treatment or other patient management decisions. A negative result may occur with  improper specimen collection/handling, submission of specimen other than nasopharyngeal swab, presence of viral mutation(s) within the areas targeted by this assay, and inadequate number of viral copies(<138 copies/mL). A negative result must be combined with clinical observations, patient history, and epidemiological information. The expected result is Negative.  Fact Sheet for Patients:  bloggercourse.com  Fact Sheet for Healthcare Providers:   seriousbroker.it  This test is no t yet approved or cleared by the [place]  FDA and  has been authorized for detection and/or diagnosis of SARS-CoV-2 by FDA under an Emergency Use Authorization (EUA). This EUA will remain  in effect (meaning this test can be used) for the duration of the COVID-19 declaration under Section 564(b)(1) of the Act, 21 U.S.C.section 360bbb-3(b)(1), unless the authorization is terminated  or revoked sooner.       Influenza A by PCR NEGATIVE NEGATIVE Final   Influenza B by PCR NEGATIVE NEGATIVE Final    Comment: (NOTE) The Xpert Xpress SARS-CoV-2/FLU/RSV plus assay is intended as an aid in the diagnosis of influenza from Nasopharyngeal swab specimens and should not be used as a sole basis for treatment. Nasal washings and aspirates are unacceptable for Xpert Xpress SARS-CoV-2/FLU/RSV testing.  Fact Sheet for Patients: bloggercourse.com  Fact Sheet for Healthcare Providers: seriousbroker.it  This test is not yet approved or cleared by the [place]  FDA and has been authorized for detection and/or diagnosis of SARS-CoV-2 by FDA under an Emergency Use Authorization (EUA). This EUA will remain in effect (meaning this test can be used) for the duration of the COVID-19 declaration under Section 564(b)(1) of the Act, 21 U.S.C. section 360bbb-3(b)(1), unless the authorization is terminated or revoked.     Resp Syncytial Virus by PCR NEGATIVE NEGATIVE Final    Comment: (NOTE) Fact Sheet for Patients: bloggercourse.com  Fact Sheet for Healthcare Providers: seriousbroker.it  This test is not yet approved or cleared by the [place]  FDA and has been authorized for detection and/or diagnosis of SARS-CoV-2 by FDA under an Emergency Use Authorization (EUA). This EUA will remain in effect (meaning this test can be used) for  the duration of the COVID-19 declaration under Section 564(b)(1) of the Act, 21 U.S.C. section 360bbb-3(b)(1), unless the authorization is terminated or revoked.  Performed at [facility], [address], [place], [place] [address]    Radiology Studies: DG Chest 2 View Result Date: 06/10/2024 CLINICAL DATA:  Shortness of breath.  CHF. EXAM: CHEST - 2 VIEW COMPARISON:  Chest CT dated 05/11/2024 FINDINGS: No focal consolidation, pleural effusion or pneumothorax. The cardiac silhouette is within normal limits. No acute osseous pathology. IMPRESSION: No active cardiopulmonary disease. Electronically Signed   By: Vanetta Chou M.D.   On: 06/10/2024 10:22   Scheduled Meds:  calcitRIOL   0.5 mcg Oral Daily   calcium  acetate  1,334 mg Oral TID WC   Chlorhexidine  Gluconate Cloth  6 each Topical Q0600   diltiazem   120 mg Oral q morning   furosemide   80 mg Oral Daily   megestrol   40 mg Oral BID   midodrine   5 mg Oral TID WC   Continuous Infusions:   LOS: 0 days    Time spent: 50 mins  Darcel Dawley, MD [facility]   If 7PM-7AM, please contact night-coverage

## 2024-06-11 NOTE — Progress Notes (Signed)
 Received patient in bed to unit.  Alert and oriented.  Informed consent signed and in chart.   TX duration: 3.5  Patient tolerated well.  Transported back to the room  Alert, without acute distress.  Hand-off given to patient's nurse.   Access used: AVG Access issues: None  Total UF removed: 700 Medication(s) given: None Post HD VS: T 98.7-HR98-RR16 B/P122/75 Post HD weight: 73.7kg 2 Units PRBCs administered with dialysis  Neville Seip, RN Kid[facility] Dialysis Unit   06/10/24 2245  Vitals  Temp 98.7 F (37.1 C)  Temp Source Oral  BP 122/75  MAP (mmHg) 90  BP Location Right Arm  BP Method Automatic  Patient Position (if appropriate) Lying  Pulse Rate 98  Pulse Rate Source Monitor  ECG Heart Rate 98  Resp 16  Weight 73.7 kg  Type of Weight Post-Dialysis  Oxygen Therapy  SpO2 100 %  O2 Device Room Air  Patient Activity (if Appropriate) In bed  Pulse Oximetry Type Continuous  During Treatment Monitoring  Blood Flow Rate (mL/min) 0 mL/min  Arterial Pressure (mmHg) -8.08 mmHg  Venous Pressure (mmHg) 60.8 mmHg  TMP (mmHg) 21.21 mmHg  Ultrafiltration Rate (mL/min) 945 mL/min  Dialysate Flow Rate (mL/min) 300 ml/min  Dialysate Potassium Concentration 3  Dialysate Calcium  Concentration 2.5  Duration of HD Treatment -hour(s) 3.5 hour(s)  Cumulative Fluid Removed (mL) per Treatment  700.13  HD Safety Checks Performed Yes  Intra-Hemodialysis Comments Tx completed  Post Treatment  Dialyzer Clearance Lightly streaked  Liters Processed 74.9  Fluid Removed (mL) 700 mL  Tolerated HD Treatment Yes  AVG/AVF Arterial Site Held (minutes) 15 minutes  AVG/AVF Venous Site Held (minutes) 10 minutes

## 2024-06-12 ENCOUNTER — Encounter (HOSPITAL_COMMUNITY): Payer: Self-pay | Admitting: Vascular Surgery

## 2024-06-12 ENCOUNTER — Encounter (HOSPITAL_COMMUNITY): Admission: EM | Disposition: A | Discharge status: Home / Self Care | Payer: Self-pay | Source: Home / Self Care

## 2024-06-12 ENCOUNTER — Other Ambulatory Visit (HOSPITAL_COMMUNITY): Payer: Self-pay

## 2024-06-12 ENCOUNTER — Other Ambulatory Visit: Payer: Self-pay | Admitting: Obstetrics and Gynecology

## 2024-06-12 DIAGNOSIS — N186 End stage renal disease: Secondary | ICD-10-CM | POA: Diagnosis not present

## 2024-06-12 DIAGNOSIS — Z992 Dependence on renal dialysis: Secondary | ICD-10-CM | POA: Diagnosis not present

## 2024-06-12 DIAGNOSIS — T82858A Stenosis of vascular prosthetic devices, implants and grafts, initial encounter: Secondary | ICD-10-CM | POA: Diagnosis not present

## 2024-06-12 DIAGNOSIS — Z9889 Other specified postprocedural states: Secondary | ICD-10-CM

## 2024-06-12 DIAGNOSIS — D649 Anemia, unspecified: Secondary | ICD-10-CM | POA: Diagnosis not present

## 2024-06-12 DIAGNOSIS — N939 Abnormal uterine and vaginal bleeding, unspecified: Secondary | ICD-10-CM

## 2024-06-12 HISTORY — PX: A/V FISTULAGRAM: CATH118298

## 2024-06-12 HISTORY — PX: UPPER EXTREMITY INTERVENTION: CATH118271

## 2024-06-12 LAB — RENAL FUNCTION PANEL
Albumin: 2.6 g/dL — ABNORMAL LOW (ref 3.5–5.0)
Anion gap: 10 (ref 5–15)
BUN: 33 mg/dL — ABNORMAL HIGH (ref 6–20)
CO2: 27 mmol/L (ref 22–32)
Calcium: 9 mg/dL (ref 8.9–10.3)
Chloride: 103 mmol/L (ref 98–111)
Creatinine, Ser: 5.41 mg/dL — ABNORMAL HIGH (ref 0.44–1.00)
GFR, Estimated: 9 mL/min — ABNORMAL LOW (ref >60)
Glucose, Bld: 95 mg/dL (ref 70–99)
Phosphorus: 6.7 mg/dL — ABNORMAL HIGH (ref 2.5–4.6)
Potassium: 4.3 mmol/L (ref 3.5–5.1)
Sodium: 140 mmol/L (ref 135–145)

## 2024-06-12 LAB — GLUCOSE, CAPILLARY
Glucose-Capillary: 63 mg/dL — ABNORMAL LOW (ref 70–99)
Glucose-Capillary: 133 mg/dL — ABNORMAL HIGH (ref 70–99)
Glucose-Capillary: 134 mg/dL — ABNORMAL HIGH (ref 70–99)

## 2024-06-12 LAB — CBC
HCT: 26 % — ABNORMAL LOW (ref 36.0–46.0)
Hemoglobin: 8.5 g/dL — ABNORMAL LOW (ref 12.0–15.0)
MCH: 29.6 pg (ref 26.0–34.0)
MCHC: 32.7 g/dL (ref 30.0–36.0)
MCV: 90.6 fL (ref 80.0–100.0)
Platelets: 226 K/uL (ref 150–400)
RBC: 2.87 MIL/uL — ABNORMAL LOW (ref 3.87–5.11)
RDW: 18.1 % — ABNORMAL HIGH (ref 11.5–15.5)
WBC: 7.7 K/uL (ref 4.0–10.5)
nRBC: 0 % (ref 0.0–0.2)

## 2024-06-12 LAB — HEPATITIS B SURFACE ANTIBODY, QUANTITATIVE: Hep B S AB Quant (Post): 557 m[IU]/mL

## 2024-06-12 MED ORDER — HEPARIN SODIUM (PORCINE) 1000 UNIT/ML IJ SOLN
3000.0000 [IU] | Freq: Once | INTRAMUSCULAR | Status: DC
Start: 2024-06-12 — End: 2024-06-13

## 2024-06-12 MED ORDER — LIDOCAINE HCL (PF) 1 % IJ SOLN
INTRAMUSCULAR | Status: AC
  Filled 2024-06-12: qty 30

## 2024-06-12 MED ORDER — HEPARIN SODIUM (PORCINE) 1000 UNIT/ML IJ SOLN
5000.0000 [IU] | Freq: Once | INTRAMUSCULAR | Status: DC
Start: 2024-06-12 — End: 2024-06-13

## 2024-06-12 MED ORDER — DIPHENHYDRAMINE HCL 25 MG PO CAPS
ORAL_CAPSULE | ORAL | Status: AC
  Filled 2024-06-12: qty 1

## 2024-06-12 MED ORDER — CHLORHEXIDINE GLUCONATE CLOTH 2 % EX PADS: 6.0000 | MEDICATED_PAD | Freq: Every day | CUTANEOUS | Status: DC

## 2024-06-12 MED ORDER — IODIXANOL 320 MG/ML IV SOLN
INTRAVENOUS | Status: DC | PRN
  Administered 2024-06-12: 40 mL

## 2024-06-12 MED ORDER — MEGESTROL ACETATE 40 MG PO TABS
40.0000 mg | ORAL_TABLET | Freq: Two times a day (BID) | ORAL | 0 refills | Status: DC
  Filled 2024-06-12: qty 60, 30d supply, fill #0

## 2024-06-12 MED ORDER — DIPHENHYDRAMINE HCL 25 MG PO CAPS
25.0000 mg | ORAL_CAPSULE | Freq: Four times a day (QID) | ORAL | Status: AC | PRN
  Administered 2024-06-12: 25 mg via ORAL

## 2024-06-12 MED ORDER — LIDOCAINE HCL (PF) 1 % IJ SOLN
INTRAMUSCULAR | Status: DC | PRN
  Administered 2024-06-12 (×2): 5 mL via INTRADERMAL

## 2024-06-12 MED ORDER — ASPIRIN-ACETAMINOPHEN-CAFFEINE 250-250-65 MG PO TABS
1.0000 | ORAL_TABLET | Freq: Once | ORAL | Status: DC
Start: 2024-06-12 — End: 2024-06-13
  Filled 2024-06-12: qty 1

## 2024-06-12 MED ORDER — HEPARIN (PORCINE) IN NACL 1000-0.9 UT/500ML-% IV SOLN
INTRAVENOUS | Status: DC | PRN
  Administered 2024-06-12: 500 mL

## 2024-06-12 MED ORDER — PENTAFLUOROPROP-TETRAFLUOROETH EX AERO: 1.0000 | INHALATION_SPRAY | CUTANEOUS | Status: DC | PRN

## 2024-06-12 NOTE — Consult Note (Addendum)
 Hospital Consult    Reason for Consult:  ESRD, graft malfunction Requesting Physician:  Lucie Collet MRN #:  [id number]  History of Present Illness: This is a 43 y.o. female who is well known to our service for prior Hemodialysis access. She had left AV graft placed in October of 2024 by Dr. Pearline. She since has required intervention on the graft in May of 2025 by Dr. Tobie and most recently by Dr. Serene on 05/10/24 with stenting of the venous outflow. She reports that ever since the graft was placed she has had issues with clots at HD. Presently they have been pulling clots from the venous cannulation site and unable to dialyze her. Vascular was consulted for evaluation.       Past Medical History:  Diagnosis Date   Anemia    Anxiety    Chronic bronchitis (HCC)    Chronic kidney disease    Diabetes (HCC) 01/01/2014   Hypertension    Increased frequency of headaches    Morbid obesity (HCC)    Necrotizing fasciitis (HCC)    Sleep apnea    does not use CPAP - - gastric bypass   Type II diabetes mellitus (HCC)    no meds, diet controlled    Past Surgical History:  Procedure Laterality Date   A/V SHUNT INTERVENTION N/A 11/17/2023   Procedure: A/V SHUNT INTERVENTION;  Surgeon: Tobie Gordy POUR, MD;  Location: [facility] INVASIVE CV LAB;  Service: Cardiovascular;  Laterality: N/A;  80% venous anastomosis   A/V SHUNT INTERVENTION Left 05/10/2024   Procedure: A/V SHUNT INTERVENTION;  Surgeon: Serene Gaile ORN, MD;  Location: [facility] PV LAB;  Service: Cardiovascular;  Laterality: Left;   AV FISTULA PLACEMENT Left 05/02/2023   Procedure: LEFT ARM ARTERIOVENOUS (AV) FISTULA  GRAFT;  Surgeon: Pearline Norman RAMAN, MD;  Location: [facility] OR;  Service: Vascular;  Laterality: Left;   BARIATRIC SURGERY     BREAST REDUCTION SURGERY  03/21/2017   CARDIAC CATHETERIZATION  01/06/2016   CARDIAC CATHETERIZATION N/A 01/06/2016   Procedure: Left Heart Cath and Coronary Angiography;  Surgeon: Deatrice DELENA Cage, MD;   Location: [facility] INVASIVE CV LAB;  Service: Cardiovascular;  Laterality: N/A;   CESAREAN SECTION  08/2014   I & D EXTREMITY Right 06/18/2022   Procedure: IRRIGATION AND DEBRIDEMENT RIGHT ABDOMEN AND THIGH;  Surgeon: Kinsinger, Herlene Righter, MD;  Location: [facility] OR;  Service: General;  Laterality: Right;   I & D EXTREMITY Right 06/20/2022   Procedure: WOUND EXPLORATION OF RIGHT THIGH AND RIGHT GROIN WITH IRRIGATION AND DEBRIDEMENT;  Surgeon: Curvin Deward MOULD, MD;  Location: [facility] OR;  Service: General;  Laterality: Right;   IR AV DIALY SHUNT INTRO NEEDLE/INTRACATH INITIAL W/PTA/IMG LEFT  10/19/2023   IR AV DIALY SHUNT INTRO NEEDLE/INTRACATH INITIAL W/PTA/IMG LEFT  02/13/2024   IR DIALY SHUNT INTRO NEEDLE/INTRACATH INITIAL W/IMG LEFT Left 11/29/2023   IR FLUORO GUIDE CV LINE RIGHT  02/10/2024   IR FLUORO GUIDE CV LINE RIGHT  02/15/2024   IR US  GUIDE VASC ACCESS LEFT  02/13/2024   IR US  GUIDE VASC ACCESS RIGHT  02/10/2024   REDUCTION MAMMAPLASTY Bilateral 03/21/2017   TRANSESOPHAGEAL ECHOCARDIOGRAM (CATH LAB) N/A 10/16/2023   Procedure: TRANSESOPHAGEAL ECHOCARDIOGRAM;  Surgeon: Barbaraann Darryle Ned, MD;  Location: [facility] INVASIVE CV LAB;  Service: Cardiovascular;  Laterality: N/A;   VENOUS ANGIOPLASTY Left 05/10/2024   Procedure: VENOUS ANGIOPLASTY;  Surgeon: Serene Gaile ORN, MD;  Location: [facility];  Service: Cardiovascular;  Laterality: Left;  Venous Anastomosis  VENOUS STENT Left 05/10/2024   Procedure: VENOUS STENT;  Surgeon: Serene Gaile ORN, MD;  Location: [facility] LAB;  Service: Cardiovascular;  Laterality: Left;  8x10 Viabahn    No Known Allergies  Prior to Admission medications   Medication Sig Start Date End Date Taking? Authorizing Provider  acetaminophen  (TYLENOL ) 500 MG tablet Take 1,000 mg by mouth 2 (two) times daily as needed for moderate pain (pain score 4-6), fever or headache.   Yes [provider]  calcitRIOL  (ROCALTROL ) 0.5 MCG capsule Take 0.5 mcg by mouth daily. Prior to dialysis on  Tuesday, Thursday, Saturdays only 10/03/23  Yes [provider]  calcium  acetate (PHOSLO ) 667 MG capsule Take 1,334 mg by mouth 3 (three) times daily with meals.   Yes [provider]  diltiazem  (CARDIZEM  CD) 120 MG 24 hr capsule Take 1 capsule (120 mg total) by mouth every morning. 02/16/24  Yes Gherghe, Costin M, MD  furosemide  (LASIX ) 40 MG tablet Take 80 mg by mouth daily. 11/15/23  Yes [provider]  medroxyPROGESTERone  (PROVERA ) 10 MG tablet Take 2 tablets (20 mg total) by mouth daily for 9 days. 06/06/24 06/15/24 Yes Geiple, Joshua, PA-C  midodrine  (PROAMATINE ) 5 MG tablet Take 5 mg by mouth 3 (three) times daily with meals.   Yes [provider]  Vitamin D , Ergocalciferol , (DRISDOL ) 1.25 MG (50000 UNIT) CAPS capsule Take 1 capsule (50,000 Units total) by mouth every Thursday. 07/07/22  Yes Angiulli, Toribio PARAS, PA-C    Social History   Socioeconomic History   Marital status: Married    Spouse name: Not on file   Number of children: Not on file   Years of education: Not on file   Highest education level: Not on file  Occupational History   Not on file  Tobacco Use   Smoking status: Never   Smokeless tobacco: Never  Vaping Use   Vaping status: Never Used  Substance and Sexual Activity   Alcohol use: No   Drug use: No   Sexual activity: Yes    Birth control/protection: None  Other Topics Concern   Not on file  Social History Narrative   Not on file   Social Drivers of Health   Financial Resource Strain: Not on file  Food Insecurity: No Food Insecurity (06/11/2024)   Hunger Vital Sign    Worried About Running Out of Food in the Last Year: Never true    Ran Out of Food in the Last Year: Never true  Transportation Needs: No Transportation Needs (06/11/2024)   PRAPARE - Administrator, Civil Service (Medical): No    Lack of Transportation (Non-Medical): No  Physical Activity: Not on file  Stress: Not on file  Social  Connections: Socially Integrated (10/11/2023)   Social Connection and Isolation Panel    Frequency of Communication with Friends and Family: More than three times a week    Frequency of Social Gatherings with Friends and Family: More than three times a week    Attends Religious Services: 1 to 4 times per year    Active Member of [employer] or Organizations: Yes    Attends Banker Meetings: More than 4 times per year    Marital Status: Married  Catering Manager Violence: Not At Risk (06/11/2024)   Humiliation, Afraid, Rape, and Kick questionnaire    Fear of Current or Ex-Partner: No    Emotionally Abused: No    Physically Abused: No    Sexually Abused: No  Family History  Problem Relation Age of Onset   Diabetes Mother    Hypertension Mother    Thyroid  disease Mother    Kidney disease Maternal Grandmother    Diabetes Maternal Grandmother    Heart attack Other     ROS: Otherwise negative unless mentioned in HPI  Physical Examination  Vitals:   06/12/24 0808 06/12/24 0823  BP: 139/74 (!) 150/79  Pulse: 83 82  Resp: 12 (!) 24  Temp:    SpO2:  100%   Body mass index is 28.27 kg/m.  General:  WDWN in NAD Gait: Not observed HENT: WNL, normocephalic Pulmonary: normal non-labored breathing Cardiac: regular Vascular Exam/Pulses: Left AV graft with great thrill, arm warm and well perfused Musculoskeletal: no muscle wasting or atrophy  Neurologic: A&O X 3 Psychiatric:  The pt has Normal affect.   CBC    Component Value Date/Time   WBC 7.7 06/12/2024 0348   RBC 2.87 (L) 06/12/2024 0348   HGB 8.5 (L) 06/12/2024 0348   HCT 26.0 (L) 06/12/2024 0348   PLT 226 06/12/2024 0348   MCV 90.6 06/12/2024 0348   MCH 29.6 06/12/2024 0348   MCHC 32.7 06/12/2024 0348   RDW 18.1 (H) 06/12/2024 0348   LYMPHSABS 2.3 06/10/2024 0921   MONOABS 0.6 06/10/2024 0921   EOSABS 0.3 06/10/2024 0921   BASOSABS 0.1 06/10/2024 0921    BMET    Component Value Date/Time   NA 140  06/12/2024 0348   K 4.3 06/12/2024 0348   CL 103 06/12/2024 0348   CO2 27 06/12/2024 0348   GLUCOSE 95 06/12/2024 0348   BUN 33 (H) 06/12/2024 0348   CREATININE 5.41 (H) 06/12/2024 0348   CALCIUM  9.0 06/12/2024 0348   GFRNONAA 9 (L) 06/12/2024 0348   GFRAA 31 (L) 12/03/2019 0345    COAGS: Lab Results  Component Value Date   INR 1.0 03/13/2022   INR 1.1 08/17/2020   INR 1.0 02/03/2020     Non-Invasive Vascular Imaging:   none  Statin:  No. Beta Blocker:  No. Aspirin :  No. ACEI:  No. ARB:  No. CCB use:  No Other antiplatelets/anticoagulants:  No.    ASSESSMENT/PLAN: This is a 43 y.o. female with ESRD on HD with malfunctioning left AV graft. Seen in HD unit with Dr. Gretta. They report no issues with arterial site but unable to cannulate the venous site. Pulling clots and unable to successfully dialyze. She recently had shuntogram by Dr. Serene on 05/10/24 with stenting of the left brachial vein with successful results felt to be amenable to future intervention. On exam graft has great thrill. As result we have offered attempt to salvage the graft.  She has not eaten today. Consent order placed. Plan will be for shuntogram of Left AV graft with Dr. Gretta in cath lab this morning. Pending findings/ intervention hopefully can re attempt dialysis later today following shuntogram. If HD unsuccessful she will require [facility] placement and likely evaluation for new access as outpatient  Teretha Damme PA-C [facility] [phone] 06/12/2024  9:04 AM  I have seen and evaluated the patient. I agree with the PA note as documented above.  43 year old female with end-stage renal disease that vascular surgery has been consulted due to malfunction of left arm AV graft.  Unable to use her graft in dialysis today and pulling clots from the venous side.  Graft has a great thrill.  Recently underwent brachial vein stenting with Dr. Serene in October.  Will plan left  arm  fistulogram today to see if we can salvage this otherwise will require TDC placement.  Lonni DOROTHA Gaskins, MD [facility] [place] Office: [phone]

## 2024-06-12 NOTE — Plan of Care (Signed)

## 2024-06-12 NOTE — Progress Notes (Signed)
 Pt off unit to dialysis

## 2024-06-12 NOTE — Progress Notes (Signed)
 Pt to be d/c 06/13/24 and had a medication at [facility]. TOC will be closed. Pt TOC medication Megace  placed in pyxis.

## 2024-06-12 NOTE — Progress Notes (Signed)
 Surgery request placed  to scheduler Will discuss dialysis restrictions with scheduler

## 2024-06-12 NOTE — Progress Notes (Signed)
[facility] Progress Note   Subjective:   Seen prior to HD. Attempted to cannulate but pulling large clots from venous side. Pt has had a lot of issues with her access, most recently had a shuntogram in October with [facility]. Says she required a catheter in August. She denies SOB, CP, dizziness, nausea. K+ 4.3. Reports vaginal bleeding has mostly stopped.   Objective Vitals:   06/12/24 0400 06/12/24 0500 06/12/24 0723 06/12/24 0740  BP:   127/71 132/71  Pulse: 85 74 92 88  Resp:   18 16  Temp:   98.7 F (37.1 C) 98.2 F (36.8 C)  TempSrc:   Oral   SpO2: 99% 100%  100%  Weight:    74.7 kg  Height:       Physical Exam General: Alert female in NAD Heart: RRR, no murmurs, rubs or gallops Lungs: CTA bilaterally, respirations unlabored Abdomen: Soft, non-distended Extremities: No edema b/l lower extremities Dialysis Access:  AVG + t/b  Additional Objective Labs: Basic Metabolic Panel: Recent Labs  Lab 06/05/24 0940 06/10/24 0921 06/12/24 0348  NA 143 143 140  K 5.0 4.3 4.3  CL 102 103 103  CO2 25 25 27   GLUCOSE 101* 83 95  BUN 43* 33* 33*  CREATININE 7.46* 6.45* 5.41*  CALCIUM  8.9 9.3 9.0  PHOS  --   --  6.7*   Liver Function Tests: Recent Labs  Lab 06/10/24 0921 06/12/24 0348  AST 22  --   ALT 17  --   ALKPHOS 53  --   BILITOT 0.5  --   PROT 6.6  --   ALBUMIN  3.8 2.6*   No results for input(s): LIPASE, AMYLASE in the last 168 hours. CBC: Recent Labs  Lab 06/05/24 0940 06/10/24 0921 06/10/24 1516 06/11/24 0232 06/12/24 0348  WBC 7.2 8.4 8.2 8.3 7.7  NEUTROABS 4.4 5.1  --   --   --   HGB 7.9* 5.3* 5.2* 8.1* 8.5*  HCT 25.4* 17.1* 17.4* 23.6* 26.0*  MCV 95.8 98.8 100.0 87.1 90.6  PLT 273 275 265 221 226   Blood Culture    Component Value Date/Time   SDES BLOOD RIGHT ARM 10/15/2023 1421   SDES BLOOD RIGHT HAND 10/15/2023 1421   SPECREQUEST  10/15/2023 1421    BOTTLES DRAWN AEROBIC AND ANAEROBIC Blood Culture results may not be  optimal due to an inadequate volume of blood received in culture bottles   SPECREQUEST  10/15/2023 1421    BOTTLES DRAWN AEROBIC AND ANAEROBIC Blood Culture adequate volume   CULT  10/15/2023 1421    NO GROWTH 5 DAYS Performed at [facility], [address] [place], [place] [address]    CULT  10/15/2023 1421    NO GROWTH 5 DAYS Performed at [facility], [address], [place], [place] [address]    REPTSTATUS 10/20/2023 FINAL 10/15/2023 1421   REPTSTATUS 10/20/2023 FINAL 10/15/2023 1421    Cardiac Enzymes: No results for input(s): CKTOTAL, CKMB, CKMBINDEX, TROPONINI in the last 168 hours. CBG: Recent Labs  Lab 06/11/24 0053 06/11/24 0614 06/11/24 0655 06/11/24 1251 06/11/24 1721  GLUCAP 113* 60* 74 78 108*   Iron Studies: No results for input(s): IRON, TIBC, TRANSFERRIN, FERRITIN in the last 72 hours. @lablastinr3 @ Studies/Results: US  PELVIC COMPLETE WITH TRANSVAGINAL Result Date: 06/11/2024 CLINICAL DATA:  Vaginal bleeding.  End-stage renal disease. EXAM: TRANSABDOMINAL AND TRANSVAGINAL ULTRASOUND OF PELVIS TECHNIQUE: Both transabdominal and transvaginal ultrasound examinations of the pelvis were performed. Transabdominal technique  was performed for global imaging of the pelvis including uterus, ovaries, adnexal regions, and pelvic cul-de-sac. It was necessary to proceed with endovaginal exam following the transabdominal exam to visualize the ovaries and better visualize the uterus and endometrium. This was limited by inability of the patient to achieve proper positioning for the transvaginal portion. COMPARISON:  06/05/2024.  Abdomen and pelvis CT dated 05/09/2023. FINDINGS: Uterus Measurements: 6.6 x 5.1 x 4.6 cm = volume: 81.7 mL. Peripheral calcified vessels seen on the previous CT. No fibroids or other mass visualized. Endometrium Interval fluid and hypoechoic debris in the endometrial cavity measuring 7 mm in thickness. The surrounding endometrium  is not well-defined, previously normal on 06/05/2024. Irregular medium echotexture mass or clot in the fundal portion of the endometrium, measuring 8 x 6 x 6 mm. No internal blood flow seen with color Doppler. Right ovary Not visualized. Left ovary Not visualized. Other findings Trace amount of free peritoneal fluid, within normal limits of physiological fluid. IMPRESSION: 1. Interval fluid and hypoechoic debris in the endometrial cavity measuring 7 mm in thickness, compatible with interval blood in the endometrium. 2. Irregular medium echotexture polyp or clot in the fundal portion of the endometrium, measuring 8 x 6 x 6 mm. No internal blood flow seen with color Doppler. 3. Nonvisualization of the ovaries. Electronically Signed   By: Elspeth Bathe M.D.   On: 06/11/2024 16:28   DG Chest 2 View Result Date: 06/10/2024 CLINICAL DATA:  Shortness of breath.  CHF. EXAM: CHEST - 2 VIEW COMPARISON:  Chest CT dated 05/11/2024 FINDINGS: No focal consolidation, pleural effusion or pneumothorax. The cardiac silhouette is within normal limits. No acute osseous pathology. IMPRESSION: No active cardiopulmonary disease. Electronically Signed   By: Vanetta Chou M.D.   On: 06/10/2024 10:22   Medications:   calcitRIOL   0.5 mcg Oral Daily   calcium  acetate  1,334 mg Oral TID WC   Chlorhexidine  Gluconate Cloth  6 each Topical Q0600   diltiazem   120 mg Oral q morning   furosemide   80 mg Oral Daily   megestrol   40 mg Oral BID   midodrine   5 mg Oral TID WC    Dialysis Orders: Center: [facility]  on TTS . 180NRe 4 hours BFR 500 DFR Auto 1.5 EDW 75kg 3K 2.5Ca  Heparin  3000 unit bolus Mircera 200mcg IV q 2 weeks- given 150mxg on 06/04/24 Venofer 50mg  weekly AVG    Last Hgb 6.9 on 06/06/24  Assessment/Plan:  Acute symptomatic anemia: Due to vaginal bleeding. 2 units PRBC given yesterday with HD and appropriate rise in Hgb.  Per Ob/Gyn, started on megace  40 mg bid and reports that the vaginal bleeding has  stopped.  Gyn following.   ESRD:  On TTS schedule but due for dialysis tomorrow because of the holiday which is Monday, Wednesday, Saturday.   Unable to cannulate AVG today, history of issues with her graft and is followed by [facility]. Will consult them, suspect she may need a TDC again.   Hypertension/volume: BP improved. Does not appear volume overloaded. UF as tolerated with next HD.   Anemia: As above.  Not due for ESA dose yet.  S/p blood transfusion with 2 units PRBC's.  Continue to follow H/H and transfuse prn Hgb <7. Hgb sable today.   Metabolic bone disease: Calcium  controlled, phos elevated. Continue calcitriol  and calcium  acetate.   Nutrition:  Alb low. Renal diet.     Lucie Collet, PA-C 06/12/2024, 8:31 AM  [facility] Pager: [phone])  [phone]   

## 2024-06-12 NOTE — Progress Notes (Signed)
 Per MD note, noted pt for possible d/c today or tomorrow. Contacted [facility] to be made aware of this info and that pt should likely resume care on Saturday. Will assist as needed.   Randine Mungo Dialysis Navigator [phone]

## 2024-06-12 NOTE — Progress Notes (Signed)
 PROGRESS NOTE    Jacqueline [name]  FMW:[id number] DOB: July 29, 1980 DOA: 06/10/2024 PCP: [name], MD   Brief Narrative:  This 43 year old female with PMH significant for anemia, ESRD on hemodialysis, hypertension, type 2 diabetes, OSA presented with menorrhagia complicated by shortness of breath and dyspnea on exertion and found to have symptomatic anemia with hemoglobin of 5.3(baseline hemoglobin 7.9- 8) Patient reports having vaginal bleeding for a week which is unusual compared to her typical 3-day duration.  She described bleeding as significant resembling the size of a golf ball and accompanied by bleeding.  Patient reports difficulty breathing, chest pain and dizziness upon standing.  She was evaluated in the [facility] ED and noted to have endometrial polyps on transvaginal ultrasound and was sent home with a prescription of Provera  which did not relieve her vagina bleeding.  She called her outpatient OB/GYN and failed to get appointment.  She presented in the ED with ongoing vaginal bleeding and significant anemia.  Hemoglobin on arrival 5.3.  Status post 2 unit PRBC hemoglobin improved to 8.0.  GYN is consulted, Nephrology is consulted.  Assessment & Plan:   Symptomatic anemia: Vaginal  bleeding secondary to endometrial polyps: Anemia of chronic disease: Patient with underlying ESRD presented with menorrhagia. Hemoglobin downtrending for last 2 months. Hb 5.3 on arrival. Status post short course of Provera  20 mg daily with no improvement in vaginal bleeding. Discussed with on-call OB/GYN.  Megace  was started 40 mg twice daily. Status post 2 unit PRBC during hemodialysis.  Hemoglobin improved  transvaginal ultrasound- Interval fluid and hypoechoic debris in the endometrial cavity measuring 7 mm in thickness, compatible with interval blood in the endometrium. 2. Irregular medium echotexture polyp or clot in the fundal portion of the endometrium, measuring 8 x 6 x 6 mm. No  internal blood flow seen with color Doppler. Needs outpatient OB/GYN follow-up at discharge.   ESRD: Renal consulted.  - Hemodialysis interrupted on 11-26 for access issues.  Patient went to the OR with Dr. Gretta where her AV graft was fixed.   Essential hypertension: Continue Lasix  and Cardizem .   Elevated troponin: This could be due to demand ischemia in the setting of menorrhagia. Patient denies any chest pain.  EKG sinus tachycardia.   Will plan to discharge either later tonight or in the a.m. after dialysis.  Will send medications to the pharmacy today as they are closed in the a.m.   DVT prophylaxis: SCDs Code Status: Full code Family Communication: No family at bed side. Disposition Plan:       Consultants:  Nephrology Gynecology   Subjective: No further bleeding  Objective: Vitals:   06/12/24 0808 06/12/24 0823 06/12/24 0927 06/12/24 1022  BP: 139/74 (!) 150/79  135/66  Pulse: 83 82  93  Resp: 12 (!) 24  18  Temp:    98.9 F (37.2 C)  TempSrc:    Oral  SpO2:  100% 100% 100%  Weight:      Height:       No intake or output data in the 24 hours ending 06/12/24 1039  Filed Weights   06/10/24 2245 06/11/24 0016 06/12/24 0740  Weight: 73.7 kg 72 kg 74.7 kg    Examination:   General: Appearance:     Overweight female in no acute distress     Lungs:     respirations unlabored  Heart:    Normal heart rate.   MS:   All extremities are intact.   Neurologic:   Awake, alert  Data Reviewed: I have personally reviewed following labs and imaging studies  CBC: Recent Labs  Lab 06/10/24 0921 06/10/24 1516 06/11/24 0232 06/12/24 0348  WBC 8.4 8.2 8.3 7.7  NEUTROABS 5.1  --   --   --   HGB 5.3* 5.2* 8.1* 8.5*  HCT 17.1* 17.4* 23.6* 26.0*  MCV 98.8 100.0 87.1 90.6  PLT 275 265 221 226   Basic Metabolic Panel: Recent Labs  Lab 06/10/24 0921 06/12/24 0348  NA 143 140  K 4.3 4.3  CL 103 103  CO2 25 27  GLUCOSE 83 95  BUN 33* 33*   CREATININE 6.45* 5.41*  CALCIUM  9.3 9.0  PHOS  --  6.7*   GFR: Estimated Creatinine Clearance: 13.1 mL/min (A) (by C-G formula based on SCr of 5.41 mg/dL (H)). Liver Function Tests: Recent Labs  Lab 06/10/24 0921 06/12/24 0348  AST 22  --   ALT 17  --   ALKPHOS 53  --   BILITOT 0.5  --   PROT 6.6  --   ALBUMIN  3.8 2.6*   No results for input(s): LIPASE, AMYLASE in the last 168 hours. No results for input(s): AMMONIA in the last 168 hours. Coagulation Profile: No results for input(s): INR, PROTIME in the last 168 hours. Cardiac Enzymes: No results for input(s): CKTOTAL, CKMB, CKMBINDEX, TROPONINI in the last 168 hours. BNP (last 3 results) Recent Labs    06/10/24 0921  PROBNP 4,761.0*   HbA1C: No results for input(s): HGBA1C in the last 72 hours. CBG: Recent Labs  Lab 06/11/24 0053 06/11/24 0614 06/11/24 0655 06/11/24 1251 06/11/24 1721  GLUCAP 113* 60* 74 78 108*   Lipid Profile: No results for input(s): CHOL, HDL, LDLCALC, TRIG, CHOLHDL, LDLDIRECT in the last 72 hours. Thyroid  Function Tests: No results for input(s): TSH, T4TOTAL, FREET4, T3FREE, THYROIDAB in the last 72 hours. Anemia Panel: No results for input(s): VITAMINB12, FOLATE, FERRITIN, TIBC, IRON, RETICCTPCT in the last 72 hours. Sepsis Labs: No results for input(s): PROCALCITON, LATICACIDVEN in the last 168 hours.  Recent Results (from the past 240 hours)  Wet prep, genital     Status: None   Collection Time: 06/05/24 11:31 AM   Specimen: PATH Cytology Cervicovaginal Ancillary Only  Result Value Ref Range Status   Yeast Wet Prep HPF POC NONE SEEN NONE SEEN Final   Trich, Wet Prep NONE SEEN NONE SEEN Final   Clue Cells Wet Prep HPF POC NONE SEEN NONE SEEN Final   WBC, Wet Prep HPF POC <10 <10 Final   Sperm NONE SEEN  Final    Comment: Performed at [facility], [address] [place], [place] [address]  Resp panel by RT-PCR  (RSV, Flu A&B, Covid) Anterior Nasal Swab     Status: None   Collection Time: 06/10/24  9:21 AM   Specimen: Anterior Nasal Swab  Result Value Ref Range Status   SARS Coronavirus 2 by RT PCR NEGATIVE NEGATIVE Final    Comment: (NOTE) SARS-CoV-2 target nucleic acids are NOT DETECTED.  The SARS-CoV-2 RNA is generally detectable in upper respiratory specimens during the acute phase of infection. The lowest concentration of SARS-CoV-2 viral copies this assay can detect is 138 copies/mL. A negative result does not preclude SARS-Cov-2 infection and should not be used as the sole basis for treatment or other patient management decisions. A negative result may occur with  improper specimen collection/handling, submission of specimen other than nasopharyngeal swab, presence of viral mutation(s) within the areas targeted  by this assay, and inadequate number of viral copies(<138 copies/mL). A negative result must be combined with clinical observations, patient history, and epidemiological information. The expected result is Negative.  Fact Sheet for Patients:  bloggercourse.com  Fact Sheet for Healthcare Providers:  seriousbroker.it  This test is no t yet approved or cleared by the [place]  FDA and  has been authorized for detection and/or diagnosis of SARS-CoV-2 by FDA under an Emergency Use Authorization (EUA). This EUA will remain  in effect (meaning this test can be used) for the duration of the COVID-19 declaration under Section 564(b)(1) of the Act, 21 U.S.C.section 360bbb-3(b)(1), unless the authorization is terminated  or revoked sooner.       Influenza A by PCR NEGATIVE NEGATIVE Final   Influenza B by PCR NEGATIVE NEGATIVE Final    Comment: (NOTE) The Xpert Xpress SARS-CoV-2/FLU/RSV plus assay is intended as an aid in the diagnosis of influenza from Nasopharyngeal swab specimens and should not be used as a sole basis for  treatment. Nasal washings and aspirates are unacceptable for Xpert Xpress SARS-CoV-2/FLU/RSV testing.  Fact Sheet for Patients: bloggercourse.com  Fact Sheet for Healthcare Providers: seriousbroker.it  This test is not yet approved or cleared by the [place]  FDA and has been authorized for detection and/or diagnosis of SARS-CoV-2 by FDA under an Emergency Use Authorization (EUA). This EUA will remain in effect (meaning this test can be used) for the duration of the COVID-19 declaration under Section 564(b)(1) of the Act, 21 U.S.C. section 360bbb-3(b)(1), unless the authorization is terminated or revoked.     Resp Syncytial Virus by PCR NEGATIVE NEGATIVE Final    Comment: (NOTE) Fact Sheet for Patients: bloggercourse.com  Fact Sheet for Healthcare Providers: seriousbroker.it  This test is not yet approved or cleared by the [place]  FDA and has been authorized for detection and/or diagnosis of SARS-CoV-2 by FDA under an Emergency Use Authorization (EUA). This EUA will remain in effect (meaning this test can be used) for the duration of the COVID-19 declaration under Section 564(b)(1) of the Act, 21 U.S.C. section 360bbb-3(b)(1), unless the authorization is terminated or revoked.  Performed at [facility], [address], [place], [place] [address]    Radiology Studies: PERIPHERAL VASCULAR CATHETERIZATION Result Date: 06/12/2024 Images from the original result were not included.   Patient name: Jacqueline [name]    MRN: [id number]        DOB: Dec 05, 1980          Sex: female  06/12/2024 Pre-operative Diagnosis: Malfunctioning left upper arm AV graft Post-operative diagnosis:  Same Surgeon:  Lonni DOROTHA Gaskins, MD Procedure Performed: 1.  Ultrasound-guided access left upper arm AV graft 2.  Left upper extremity fistulogram including central venogram 3.  Left  peripheral angioplasty mid upper arm AV graft for 50% stenosis with a 7 mm x 40 mm Mustang 4.  Ultrasound-guided retrograde access left upper arm AV graft 5.  Left peripheral angioplasty AV graft arterial anastomosis for high-grade 80% stenosis with a 5 mm x 40 mm Mustang  Indications: 43 year old female using a left upper arm AV graft.  She presents for left arm fistulogram due to malfunction after risk-benefits discussed.  Findings:  Ultrasound-guided access left upper arm AV graft.  Upper extremity fistulogram showed no central venous stenosis.  The stent in the brachial vein was widely patent.  There was about a 50% stenosis in the mid upper graft that did not appear flow-limiting but this was treated with a 7 mm  Mustang.  Retrograde shot showed high-grade stenosis adjacent to the artery anastomosis over 80%.  I then reaccessed the graft retrograde and treated the graft adjacent to the artery anastomosis including the arterial connection with a 5 mm Mustang.  Much better thrill.  Widely patent at completion.              Procedure:  The patient was identified in the holding area and taken to room 8.  Placed upon on the table in the supine position.  The left arm was prepped draped in standard sterile fashion.  Timeout was performed.  Initially injected 1% lidocaine  without epinephrine  over the left arm graft.  This was accessed with a micro access needle, placed a microwire, and a micro sheath.  Got left extremity fistulogram including central venogram.  We also got a retrograde sheath shot.  Elected to treat a 50% stenosis in the mid arm graft with a 7 mm x 40 mm Mustang to nominal pressure for 2 minutes after we upsized to a 6 French sheath over a benson wire.  The stent was otherwise widely patent previously placed Dr. Serene.  I tied a pursestring around the sheath and this was removed.  I then reaccessed the graft retrograde toward the arterial anastomosis to treat the connection that we identified where  there was a high-grade stenosis adjacent to the arterial anastomosis.  I used a micro access needle placed a microwire and then a micro sheath.  I then used a glide wire to get across the arterial anastomosis.  This was then treated with a 5 mm x 40 mm Mustang nominal pressure for 2 minutes.  Much better results.  No significant residual stenosis.  Pursestring was tied around the sheath and this was removed.     Lonni DOROTHA Gaskins, MD [facility] Office: [phone]    US  PELVIC COMPLETE WITH TRANSVAGINAL Result Date: 06/11/2024 CLINICAL DATA:  Vaginal bleeding.  End-stage renal disease. EXAM: TRANSABDOMINAL AND TRANSVAGINAL ULTRASOUND OF PELVIS TECHNIQUE: Both transabdominal and transvaginal ultrasound examinations of the pelvis were performed. Transabdominal technique was performed for global imaging of the pelvis including uterus, ovaries, adnexal regions, and pelvic cul-de-sac. It was necessary to proceed with endovaginal exam following the transabdominal exam to visualize the ovaries and better visualize the uterus and endometrium. This was limited by inability of the patient to achieve proper positioning for the transvaginal portion. COMPARISON:  06/05/2024.  Abdomen and pelvis CT dated 05/09/2023. FINDINGS: Uterus Measurements: 6.6 x 5.1 x 4.6 cm = volume: 81.7 mL. Peripheral calcified vessels seen on the previous CT. No fibroids or other mass visualized. Endometrium Interval fluid and hypoechoic debris in the endometrial cavity measuring 7 mm in thickness. The surrounding endometrium is not well-defined, previously normal on 06/05/2024. Irregular medium echotexture mass or clot in the fundal portion of the endometrium, measuring 8 x 6 x 6 mm. No internal blood flow seen with color Doppler. Right ovary Not visualized. Left ovary Not visualized. Other findings Trace amount of free peritoneal fluid, within normal limits of physiological fluid. IMPRESSION: 1. Interval fluid  and hypoechoic debris in the endometrial cavity measuring 7 mm in thickness, compatible with interval blood in the endometrium. 2. Irregular medium echotexture polyp or clot in the fundal portion of the endometrium, measuring 8 x 6 x 6 mm. No internal blood flow seen with color Doppler. 3. Nonvisualization of the ovaries. Electronically Signed   By: Elspeth Bathe M.D.   On: 06/11/2024 16:28  Scheduled Meds:  [MAR Hold] calcitRIOL   0.5 mcg Oral Daily   [MAR Hold] calcium  acetate  1,334 mg Oral TID WC   [MAR Hold] Chlorhexidine  Gluconate Cloth  6 each Topical Q0600   [MAR Hold] diltiazem   120 mg Oral q morning   [MAR Hold] furosemide   80 mg Oral Daily   [MAR Hold] megestrol   40 mg Oral BID   [MAR Hold] midodrine   5 mg Oral TID WC   Continuous Infusions:   LOS: 0 days    Time spent: 50 mins    Harlene RAYMOND Bowl, DO [facility]   If 7PM-7AM, please contact night-coverage

## 2024-06-12 NOTE — Progress Notes (Signed)
 Pt back on unit.

## 2024-06-12 NOTE — Procedures (Signed)
 HD Note:  Some information was entered later than the data was gathered due to patient care needs. The stated time with the data is accurate.  Received patient in bed to unit.   Alert and oriented.   Informed consent signed and in chart.   Access used: Left upper arm fistula Access issues: Unable to cannulate, clots were pulled several times.  Dr. Rayburn notified. Unable to initiate treatment.  Hand-off given to patient's nurse.   Transported back to the room   Kayshaun Polanco L. Lenon, RN Kidney Dialysis Unit.

## 2024-06-12 NOTE — Procedures (Signed)
 HD Note:  Some information was entered later than the data was gathered due to patient care needs. The stated time with the data is accurate.  Received patient in bed to unit.   Alert and oriented.   Informed consent signed and in chart.   Access used: Upper left arm graft Access issues: Cannulation was successful.  Venous access infiltrated causing an end to the treatment.  Patient stated that she hadn't moved.  Patient stated she started feeling pain and burning in the area and swelling was noted.  TX duration:2.75  hours  Alert, without acute distress.  Total UF removed: 1000 ml  Hand-off given to patient's nurse.   Transported back to the room   Javana Schey L. Lenon, RN Kidney Dialysis Unit.

## 2024-06-12 NOTE — Progress Notes (Signed)
   06/12/24 1851  Vitals  Temp 99 F (37.2 C)  Temp Source Oral  BP 116/68  MAP (mmHg) 83  BP Location Right Arm  BP Method Automatic  Patient Position (if appropriate) Lying  Pulse Rate 97  Pulse Rate Source Monitor  Resp 18  Level of Consciousness  Level of Consciousness Alert  MEWS COLOR  MEWS Score Color Green  Oxygen Therapy  SpO2 100 %  O2 Device Room Air  MEWS Score  MEWS Temp 0  MEWS Systolic 0  MEWS Pulse 0  MEWS RR 0  MEWS LOC 0  MEWS Score 0   Pt back on unit from dialysis, VSS, a&o x4, per dialysis nurse fistula site infiltrated, pt left upper extremity at fistula site is swollen. LUE is elevated and ice pack on.

## 2024-06-12 NOTE — Op Note (Signed)
    Patient name: Jacqueline [name] MRN: [id number] DOB: 01/15/1981 Sex: female  06/12/2024 Pre-operative Diagnosis: Malfunctioning left upper arm AV graft Post-operative diagnosis:  Same Surgeon:  Lonni DOROTHA Gaskins, MD Procedure Performed: 1.  Ultrasound-guided access left upper arm AV graft 2.  Left upper extremity fistulogram including central venogram 3.  Left peripheral angioplasty mid upper arm AV graft for 50% stenosis with a 7 mm x 40 mm Mustang 4.  Ultrasound-guided retrograde access left upper arm AV graft 5.  Left peripheral angioplasty AV graft arterial anastomosis for high-grade 80% stenosis with a 5 mm x 40 mm Mustang  Indications: 43 year old female using a left upper arm AV graft.  She presents for left arm fistulogram due to malfunction after risk-benefits discussed.  Findings:   Ultrasound-guided access left upper arm AV graft.  Upper extremity fistulogram showed no central venous stenosis.  The stent in the brachial vein was widely patent.  There was about a 50% stenosis in the mid upper graft that did not appear flow-limiting but this was treated with a 7 mm Mustang.  Retrograde shot showed high-grade stenosis adjacent to the artery anastomosis over 80%.  I then reaccessed the graft retrograde and treated the graft adjacent to the artery anastomosis including the arterial connection with a 5 mm Mustang.  Much better thrill.  Widely patent at completion.     Procedure:  The patient was identified in the holding area and taken to room 8.  Placed upon on the table in the supine position.  The left arm was prepped draped in standard sterile fashion.  Timeout was performed.  Initially injected 1% lidocaine  without epinephrine  over the left arm graft.  This was accessed with a micro access needle, placed a microwire, and a micro sheath.  Got left extremity fistulogram including central venogram.  We also got a retrograde sheath shot.  Elected to treat a 50% stenosis in the mid arm  graft with a 7 mm x 40 mm Mustang to nominal pressure for 2 minutes after we upsized to a 6 French sheath over a benson wire.  The stent was otherwise widely patent previously placed Dr. Serene.  I tied a pursestring around the sheath and this was removed.  I then reaccessed the graft retrograde toward the arterial anastomosis to treat the connection that we identified where there was a high-grade stenosis adjacent to the arterial anastomosis.  I used a micro access needle placed a microwire and then a micro sheath.  I then used a glide wire to get across the arterial anastomosis.  This was then treated with a 5 mm x 40 mm Mustang nominal pressure for 2 minutes.  Much better results.  No significant residual stenosis.  Pursestring was tied around the sheath and this was removed.     Lonni DOROTHA Gaskins, MD [facility] Office: [phone]

## 2024-06-13 DIAGNOSIS — D649 Anemia, unspecified: Secondary | ICD-10-CM | POA: Diagnosis not present

## 2024-06-13 LAB — RENAL FUNCTION PANEL
Albumin: 3.1 g/dL — ABNORMAL LOW (ref 3.5–5.0)
Anion gap: 14 (ref 5–15)
BUN: 28 mg/dL — ABNORMAL HIGH (ref 6–20)
CO2: 24 mmol/L (ref 22–32)
Calcium: 9.2 mg/dL (ref 8.9–10.3)
Chloride: 100 mmol/L (ref 98–111)
Creatinine, Ser: 4 mg/dL — ABNORMAL HIGH (ref 0.44–1.00)
GFR, Estimated: 13 mL/min — ABNORMAL LOW (ref >60)
Glucose, Bld: 96 mg/dL (ref 70–99)
Phosphorus: 5.5 mg/dL — ABNORMAL HIGH (ref 2.5–4.6)
Potassium: 4.8 mmol/L (ref 3.5–5.1)
Sodium: 138 mmol/L (ref 135–145)

## 2024-06-13 LAB — GLUCOSE, CAPILLARY: Glucose-Capillary: 71 mg/dL (ref 70–99)

## 2024-06-13 MED ORDER — MEGESTROL ACETATE 40 MG PO TABS: 40.0000 mg | ORAL_TABLET | Freq: Three times a day (TID) | ORAL | 0 refills | Status: AC

## 2024-06-13 NOTE — Discharge Planning (Signed)
 [facility] Patient Discharge Orders- [facility] CLINIC: [place]  Patient's name: Jacqueline [name] Admit/DC Dates: 06/10/2024 - 06/13/2024  Discharge Diagnoses: Vaginal bleeding    AVG issue  Aranesp : Given: no   Date and amount of last dose: N/A  Last Hgb: 8.5 PRBC's Given: Yes Date/# of units: 1 unit on 11/24, 1 unit on 11/25 ESA dose for discharge: mircera 200 mcg IV q 2 weeks  IV Iron dose at discharge: 50mg  weekly  Heparin  change: yes- no heparin  due to bleed  EDW Change: no New EDW:   [place] Change: no  Access intervention/Change: Yes Details: pulling clots from venous side, had fistulogram 11/26 and completed HD but infiltrated. Refer back to [facility] if ongoing access issues  Hectorol/Calcitriol  change: no  Discharge Labs: Calcium  9.2 Phosphorus 5.5 Albumin  3.1 K+ 4.8  IV Antibiotics: no Details:  On Coumadin?: no Last INR: Next INR: Managed By:   OTHER/APPTS/LAB ORDERS:    D/C Meds to be reconciled by nurse after every discharge.  Completed By: Lucie Collet, PA-C 06/13/2024, 10:20 AM  [facility] Pager: [phone]    Reviewed by: MD:______ RN_______

## 2024-06-13 NOTE — Progress Notes (Signed)
 Patient given discharge instructions. Patient educated on physicians change of Megestrol  40mg , now 3 times a day instead of 2 times a day. IV removed without issue. Patient's belongings collected and returned to patient.

## 2024-06-13 NOTE — Discharge Summary (Signed)
 Physician Discharge Summary  Jacqueline [name] FMW:[id number] DOB: 1981/05/30 DOA: 06/10/2024  PCP: [name], MD  Admit date: 06/10/2024 Discharge date: 06/13/2024  Admitted From:  Discharge disposition: home   Recommendations for Outpatient Follow-Up:   Currently at acceptable medical risk for GYN procedure   Discharge Diagnosis:   Principal Problem:   Symptomatic anemia    Discharge Condition: Improved.  Diet recommendation: renal  Wound care: None.  Code status: Full.   History of Present Illness:   Jacqueline [name] is a 43 y.o. female with medical history significant of anemia, ESRD on HD, HTN, DM2, and OSA p/w menorhagia c/b SOB/DOE and found to have symptomatic anemia with Hb 5.3 (previously 7.9 on 11/19 and 10.7 on 10/25).   The patient reported experiencing vaginal bleeding for a week and a half, which was unusual compared to the typical three-day duration. The bleeding was described as significant, resembling the size of golf balls and accompanied by bleeding. The patient experienced no pain but reported difficulty breathing, chest pain, and dizziness upon standing. The pt was evaluated at [facility] ED and noted to have endometrial polyps per transvaginal US  and sent home with 9 day prescription for Provera , which did not relieve her vaginal bleeding. She called to schedule an OP appointment with OB-GYN, but has failed to be evaluated and re-presented with ongoing vaginal bleeding and noted to have significant anemia.   In the ED, pt hypertensive, tachycardic, and tachypneic. Labs notable for Cr 6.45, proBNP 4761, and troponin 83. CXR neg fro acute disease. [facility] EDP requested ED to ED transfer for emergent transfusion.   Hospital Course by Problem:   Symptomatic anemia: Vaginal  bleeding secondary to endometrial polyps: Anemia of chronic disease: Patient with underlying ESRD presented with menorrhagia. Hemoglobin downtrending for last 2 months. Hb 5.3 on  arrival. Status post short course of Provera  20 mg daily with no improvement in vaginal bleeding. Discussed with on-call OB/GYN.  Megace  was started 40 mg TID Status post 2 unit PRBC during hemodialysis.  Hemoglobin improved  transvaginal ultrasound- Interval fluid and hypoechoic debris in the endometrial cavity measuring 7 mm in thickness, compatible with interval blood in the endometrium. 2. Irregular medium echotexture polyp or clot in the fundal portion of the endometrium, measuring 8 x 6 x 6 mm. No internal blood flow seen with color Doppler. Needs outpatient OB/GYN follow-up at discharge.     ESRD: Renal consulted.  - Hemodialysis interrupted on 11-26 for access issues.  Patient went to the OR with Dr. Gretta where her AV graft was fixed.    Essential hypertension: Continue Lasix  and Cardizem .     Elevated troponin: This could be due to demand ischemia in the setting of menorrhagia. Patient denies any chest pain.    Medical Consultants:   GYN   Discharge Exam:   Vitals:   06/13/24 0229 06/13/24 0801  BP: (!) 114/59 130/74  Pulse: 90 87  Resp:  18  Temp: 97.9 F (36.6 C) 98.1 F (36.7 C)  SpO2: 98% 100%   Vitals:   06/12/24 1946 06/12/24 2358 06/13/24 0229 06/13/24 0801  BP: (!) 101/59 130/60 (!) 114/59 130/74  Pulse: 92 87 90 87  Resp:    18  Temp: 98.7 F (37.1 C) 98.6 F (37 C) 97.9 F (36.6 C) 98.1 F (36.7 C)  TempSrc: Oral Oral Oral Oral  SpO2: 99% 100% 98% 100%  Weight:      Height:  General exam: Appears calm and comfortable. Some spotting today   The results of significant diagnostics from this hospitalization (including imaging, microbiology, ancillary and laboratory) are listed below for reference.     Procedures and Diagnostic Studies:   US  PELVIC COMPLETE WITH TRANSVAGINAL Result Date: 06/11/2024 CLINICAL DATA:  Vaginal bleeding.  End-stage renal disease. EXAM: TRANSABDOMINAL AND TRANSVAGINAL ULTRASOUND OF PELVIS TECHNIQUE:  Both transabdominal and transvaginal ultrasound examinations of the pelvis were performed. Transabdominal technique was performed for global imaging of the pelvis including uterus, ovaries, adnexal regions, and pelvic cul-de-sac. It was necessary to proceed with endovaginal exam following the transabdominal exam to visualize the ovaries and better visualize the uterus and endometrium. This was limited by inability of the patient to achieve proper positioning for the transvaginal portion. COMPARISON:  06/05/2024.  Abdomen and pelvis CT dated 05/09/2023. FINDINGS: Uterus Measurements: 6.6 x 5.1 x 4.6 cm = volume: 81.7 mL. Peripheral calcified vessels seen on the previous CT. No fibroids or other mass visualized. Endometrium Interval fluid and hypoechoic debris in the endometrial cavity measuring 7 mm in thickness. The surrounding endometrium is not well-defined, previously normal on 06/05/2024. Irregular medium echotexture mass or clot in the fundal portion of the endometrium, measuring 8 x 6 x 6 mm. No internal blood flow seen with color Doppler. Right ovary Not visualized. Left ovary Not visualized. Other findings Trace amount of free peritoneal fluid, within normal limits of physiological fluid. IMPRESSION: 1. Interval fluid and hypoechoic debris in the endometrial cavity measuring 7 mm in thickness, compatible with interval blood in the endometrium. 2. Irregular medium echotexture polyp or clot in the fundal portion of the endometrium, measuring 8 x 6 x 6 mm. No internal blood flow seen with color Doppler. 3. Nonvisualization of the ovaries. Electronically Signed   By: Elspeth Bathe M.D.   On: 06/11/2024 16:28   DG Chest 2 View Result Date: 06/10/2024 CLINICAL DATA:  Shortness of breath.  CHF. EXAM: CHEST - 2 VIEW COMPARISON:  Chest CT dated 05/11/2024 FINDINGS: No focal consolidation, pleural effusion or pneumothorax. The cardiac silhouette is within normal limits. No acute osseous pathology. IMPRESSION: No  active cardiopulmonary disease. Electronically Signed   By: Vanetta Chou M.D.   On: 06/10/2024 10:22     Labs:   Basic Metabolic Panel: Recent Labs  Lab 06/10/24 0921 06/12/24 0348 06/13/24 0333  NA 143 140 138  K 4.3 4.3 4.8  CL 103 103 100  CO2 25 27 24   GLUCOSE 83 95 96  BUN 33* 33* 28*  CREATININE 6.45* 5.41* 4.00*  CALCIUM  9.3 9.0 9.2  PHOS  --  6.7* 5.5*   GFR Estimated Creatinine Clearance: 17.8 mL/min (A) (by C-G formula based on SCr of 4 mg/dL (H)). Liver Function Tests: Recent Labs  Lab 06/10/24 0921 06/12/24 0348 06/13/24 0333  AST 22  --   --   ALT 17  --   --   ALKPHOS 53  --   --   BILITOT 0.5  --   --   PROT 6.6  --   --   ALBUMIN  3.8 2.6* 3.1*   No results for input(s): LIPASE, AMYLASE in the last 168 hours. No results for input(s): AMMONIA in the last 168 hours. Coagulation profile No results for input(s): INR, PROTIME in the last 168 hours.  CBC: Recent Labs  Lab 06/10/24 0921 06/10/24 1516 06/11/24 0232 06/12/24 0348  WBC 8.4 8.2 8.3 7.7  NEUTROABS 5.1  --   --   --  HGB 5.3* 5.2* 8.1* 8.5*  HCT 17.1* 17.4* 23.6* 26.0*  MCV 98.8 100.0 87.1 90.6  PLT 275 265 221 226   Cardiac Enzymes: No results for input(s): CKTOTAL, CKMB, CKMBINDEX, TROPONINI in the last 168 hours. BNP: Invalid input(s): POCBNP CBG: Recent Labs  Lab 06/11/24 1721 06/12/24 1131 06/12/24 1316 06/12/24 1849 06/13/24 0759  GLUCAP 108* 63* 133* 134* 71   D-Dimer No results for input(s): DDIMER in the last 72 hours. Hgb A1c No results for input(s): HGBA1C in the last 72 hours. Lipid Profile No results for input(s): CHOL, HDL, LDLCALC, TRIG, CHOLHDL, LDLDIRECT in the last 72 hours. Thyroid  function studies No results for input(s): TSH, T4TOTAL, T3FREE, THYROIDAB in the last 72 hours.  Invalid input(s): FREET3 Anemia work up No results for input(s): VITAMINB12, FOLATE, FERRITIN, TIBC, IRON,  RETICCTPCT in the last 72 hours. Microbiology Recent Results (from the past 240 hours)  Wet prep, genital     Status: None   Collection Time: 06/05/24 11:31 AM   Specimen: PATH Cytology Cervicovaginal Ancillary Only  Result Value Ref Range Status   Yeast Wet Prep HPF POC NONE SEEN NONE SEEN Final   Trich, Wet Prep NONE SEEN NONE SEEN Final   Clue Cells Wet Prep HPF POC NONE SEEN NONE SEEN Final   WBC, Wet Prep HPF POC <10 <10 Final   Sperm NONE SEEN  Final    Comment: Performed at [facility], [address] [place], [place] [address]  Resp panel by RT-PCR (RSV, Flu A&B, Covid) Anterior Nasal Swab     Status: None   Collection Time: 06/10/24  9:21 AM   Specimen: Anterior Nasal Swab  Result Value Ref Range Status   SARS Coronavirus 2 by RT PCR NEGATIVE NEGATIVE Final    Comment: (NOTE) SARS-CoV-2 target nucleic acids are NOT DETECTED.  The SARS-CoV-2 RNA is generally detectable in upper respiratory specimens during the acute phase of infection. The lowest concentration of SARS-CoV-2 viral copies this assay can detect is 138 copies/mL. A negative result does not preclude SARS-Cov-2 infection and should not be used as the sole basis for treatment or other patient management decisions. A negative result may occur with  improper specimen collection/handling, submission of specimen other than nasopharyngeal swab, presence of viral mutation(s) within the areas targeted by this assay, and inadequate number of viral copies(<138 copies/mL). A negative result must be combined with clinical observations, patient history, and epidemiological information. The expected result is Negative.  Fact Sheet for Patients:  bloggercourse.com  Fact Sheet for Healthcare Providers:  seriousbroker.it  This test is no t yet approved or cleared by the [place]  FDA and  has been authorized for detection and/or diagnosis of SARS-CoV-2  by FDA under an Emergency Use Authorization (EUA). This EUA will remain  in effect (meaning this test can be used) for the duration of the COVID-19 declaration under Section 564(b)(1) of the Act, 21 U.S.C.section 360bbb-3(b)(1), unless the authorization is terminated  or revoked sooner.       Influenza A by PCR NEGATIVE NEGATIVE Final   Influenza B by PCR NEGATIVE NEGATIVE Final    Comment: (NOTE) The Xpert Xpress SARS-CoV-2/FLU/RSV plus assay is intended as an aid in the diagnosis of influenza from Nasopharyngeal swab specimens and should not be used as a sole basis for treatment. Nasal washings and aspirates are unacceptable for Xpert Xpress SARS-CoV-2/FLU/RSV testing.  Fact Sheet for Patients: bloggercourse.com  Fact Sheet for Healthcare Providers: seriousbroker.it  This test is  not yet approved or cleared by the [place]  FDA and has been authorized for detection and/or diagnosis of SARS-CoV-2 by FDA under an Emergency Use Authorization (EUA). This EUA will remain in effect (meaning this test can be used) for the duration of the COVID-19 declaration under Section 564(b)(1) of the Act, 21 U.S.C. section 360bbb-3(b)(1), unless the authorization is terminated or revoked.     Resp Syncytial Virus by PCR NEGATIVE NEGATIVE Final    Comment: (NOTE) Fact Sheet for Patients: bloggercourse.com  Fact Sheet for Healthcare Providers: seriousbroker.it  This test is not yet approved or cleared by the [place]  FDA and has been authorized for detection and/or diagnosis of SARS-CoV-2 by FDA under an Emergency Use Authorization (EUA). This EUA will remain in effect (meaning this test can be used) for the duration of the COVID-19 declaration under Section 564(b)(1) of the Act, 21 U.S.C. section 360bbb-3(b)(1), unless the authorization is terminated or revoked.  Performed at [facility], [address], [place], [place] [address]      Discharge Instructions:   Discharge Instructions     Discharge instructions   Complete by: As directed    Renal diet   Increase activity slowly   Complete by: As directed       Allergies as of 06/13/2024   No Known Allergies      Medication List     STOP taking these medications    medroxyPROGESTERone  10 MG tablet Commonly known as: PROVERA        TAKE these medications    acetaminophen  500 MG tablet Commonly known as: TYLENOL  Take 1,000 mg by mouth 2 (two) times daily as needed for moderate pain (pain score 4-6), fever or headache.   calcitRIOL  0.5 MCG capsule Commonly known as: ROCALTROL  Take 0.5 mcg by mouth daily. Prior to dialysis on Tuesday, Thursday, Saturdays only   calcium  acetate 667 MG capsule Commonly known as: PHOSLO  Take 1,334 mg by mouth 3 (three) times daily with meals.   diltiazem  120 MG 24 hr capsule Commonly known as: CARDIZEM  CD Take 1 capsule (120 mg total) by mouth every morning.   furosemide  40 MG tablet Commonly known as: LASIX  Take 80 mg by mouth daily.   megestrol  40 MG tablet Commonly known as: MEGACE  Take 1 tablet (40 mg total) by mouth 3 (three) times daily.   midodrine  5 MG tablet Commonly known as: PROAMATINE  Take 5 mg by mouth 3 (three) times daily with meals.   Vitamin D  (Ergocalciferol ) 1.25 MG (50000 UNIT) Caps capsule Commonly known as: DRISDOL  Take 1 capsule (50,000 Units total) by mouth every Thursday.          Time coordinating discharge: 45 min  Signed:  Harlene RAYMOND Bowl DO  [facility] 06/13/2024, 11:27 AM

## 2024-06-16 DIAGNOSIS — Z992 Dependence on renal dialysis: Secondary | ICD-10-CM | POA: Diagnosis not present

## 2024-06-16 DIAGNOSIS — N186 End stage renal disease: Secondary | ICD-10-CM | POA: Diagnosis not present

## 2024-06-16 DIAGNOSIS — I120 Hypertensive chronic kidney disease with stage 5 chronic kidney disease or end stage renal disease: Secondary | ICD-10-CM | POA: Diagnosis not present

## 2024-06-17 NOTE — Progress Notes (Signed)
 Late Note Entry- Jun 17, 2024  Pt was d/c on Thursday. Navigator out of the office on Thursday and Friday due to the holiday. Contacted [facility] this morning to be advised of pt's d/c date and that pt should have resumed care on Saturday.   Randine Mungo Dialysis Navigator [phone]

## 2024-06-18 DIAGNOSIS — N2581 Secondary hyperparathyroidism of renal origin: Secondary | ICD-10-CM | POA: Diagnosis not present

## 2024-06-18 DIAGNOSIS — N186 End stage renal disease: Secondary | ICD-10-CM | POA: Diagnosis not present

## 2024-06-18 DIAGNOSIS — Z992 Dependence on renal dialysis: Secondary | ICD-10-CM | POA: Diagnosis not present

## 2024-06-27 DIAGNOSIS — N186 End stage renal disease: Secondary | ICD-10-CM | POA: Diagnosis not present

## 2024-06-27 DIAGNOSIS — N2581 Secondary hyperparathyroidism of renal origin: Secondary | ICD-10-CM | POA: Diagnosis not present

## 2024-06-27 DIAGNOSIS — Z992 Dependence on renal dialysis: Secondary | ICD-10-CM | POA: Diagnosis not present

## 2024-06-27 DIAGNOSIS — D631 Anemia in chronic kidney disease: Secondary | ICD-10-CM | POA: Diagnosis not present

## 2024-07-01 DIAGNOSIS — R509 Fever, unspecified: Secondary | ICD-10-CM | POA: Diagnosis not present

## 2024-07-01 DIAGNOSIS — M791 Myalgia, unspecified site: Secondary | ICD-10-CM | POA: Diagnosis not present

## 2024-07-01 DIAGNOSIS — R0981 Nasal congestion: Secondary | ICD-10-CM | POA: Diagnosis not present

## 2024-07-01 DIAGNOSIS — Z20828 Contact with and (suspected) exposure to other viral communicable diseases: Secondary | ICD-10-CM | POA: Diagnosis not present

## 2024-07-15 ENCOUNTER — Other Ambulatory Visit: Payer: Self-pay

## 2024-07-15 ENCOUNTER — Inpatient Hospital Stay (HOSPITAL_BASED_OUTPATIENT_CLINIC_OR_DEPARTMENT_OTHER)
Admission: EM | Admit: 2024-07-15 | Discharge: 2024-07-22 | DRG: 949 | Disposition: A | Discharge status: Home / Self Care | Attending: Family Medicine | Admitting: Family Medicine

## 2024-07-15 ENCOUNTER — Encounter (HOSPITAL_BASED_OUTPATIENT_CLINIC_OR_DEPARTMENT_OTHER): Payer: Self-pay | Admitting: Emergency Medicine

## 2024-07-15 ENCOUNTER — Emergency Department (HOSPITAL_BASED_OUTPATIENT_CLINIC_OR_DEPARTMENT_OTHER)

## 2024-07-15 DIAGNOSIS — Z992 Dependence on renal dialysis: Secondary | ICD-10-CM

## 2024-07-15 DIAGNOSIS — I5032 Chronic diastolic (congestive) heart failure: Secondary | ICD-10-CM | POA: Diagnosis present

## 2024-07-15 DIAGNOSIS — N183 Chronic kidney disease, stage 3 unspecified: Secondary | ICD-10-CM | POA: Diagnosis present

## 2024-07-15 DIAGNOSIS — R079 Chest pain, unspecified: Secondary | ICD-10-CM | POA: Diagnosis not present

## 2024-07-15 DIAGNOSIS — E8770 Fluid overload, unspecified: Secondary | ICD-10-CM

## 2024-07-15 DIAGNOSIS — T82510A Breakdown (mechanical) of surgically created arteriovenous fistula, initial encounter: Principal | ICD-10-CM | POA: Diagnosis present

## 2024-07-15 DIAGNOSIS — I2489 Other forms of acute ischemic heart disease: Secondary | ICD-10-CM | POA: Diagnosis present

## 2024-07-15 DIAGNOSIS — E66813 Obesity, class 3: Secondary | ICD-10-CM | POA: Diagnosis present

## 2024-07-15 DIAGNOSIS — D649 Anemia, unspecified: Principal | ICD-10-CM | POA: Diagnosis present

## 2024-07-15 DIAGNOSIS — Z91158 Patient's noncompliance with renal dialysis for other reason: Secondary | ICD-10-CM

## 2024-07-15 DIAGNOSIS — T82898A Other specified complication of vascular prosthetic devices, implants and grafts, initial encounter: Secondary | ICD-10-CM | POA: Diagnosis present

## 2024-07-15 DIAGNOSIS — N186 End stage renal disease: Secondary | ICD-10-CM | POA: Diagnosis present

## 2024-07-15 DIAGNOSIS — G4733 Obstructive sleep apnea (adult) (pediatric): Secondary | ICD-10-CM | POA: Diagnosis present

## 2024-07-15 DIAGNOSIS — N189 Chronic kidney disease, unspecified: Secondary | ICD-10-CM | POA: Diagnosis present

## 2024-07-15 DIAGNOSIS — E538 Deficiency of other specified B group vitamins: Secondary | ICD-10-CM | POA: Diagnosis present

## 2024-07-15 DIAGNOSIS — Z9884 Bariatric surgery status: Secondary | ICD-10-CM

## 2024-07-15 DIAGNOSIS — N2581 Secondary hyperparathyroidism of renal origin: Secondary | ICD-10-CM | POA: Diagnosis present

## 2024-07-15 DIAGNOSIS — S2242XA Multiple fractures of ribs, left side, initial encounter for closed fracture: Secondary | ICD-10-CM | POA: Diagnosis not present

## 2024-07-15 DIAGNOSIS — I5033 Acute on chronic diastolic (congestive) heart failure: Secondary | ICD-10-CM | POA: Diagnosis present

## 2024-07-15 DIAGNOSIS — Z8349 Family history of other endocrine, nutritional and metabolic diseases: Secondary | ICD-10-CM

## 2024-07-15 DIAGNOSIS — I517 Cardiomegaly: Secondary | ICD-10-CM | POA: Diagnosis not present

## 2024-07-15 DIAGNOSIS — K219 Gastro-esophageal reflux disease without esophagitis: Secondary | ICD-10-CM | POA: Diagnosis present

## 2024-07-15 DIAGNOSIS — I480 Paroxysmal atrial fibrillation: Secondary | ICD-10-CM | POA: Diagnosis present

## 2024-07-15 DIAGNOSIS — R7989 Other specified abnormal findings of blood chemistry: Secondary | ICD-10-CM | POA: Diagnosis present

## 2024-07-15 DIAGNOSIS — E872 Acidosis, unspecified: Secondary | ICD-10-CM | POA: Diagnosis present

## 2024-07-15 DIAGNOSIS — Z79899 Other long term (current) drug therapy: Principal | ICD-10-CM

## 2024-07-15 DIAGNOSIS — Y712 Prosthetic and other implants, materials and accessory cardiovascular devices associated with adverse incidents: Secondary | ICD-10-CM | POA: Diagnosis present

## 2024-07-15 DIAGNOSIS — R0989 Other specified symptoms and signs involving the circulatory and respiratory systems: Secondary | ICD-10-CM | POA: Diagnosis not present

## 2024-07-15 DIAGNOSIS — E559 Vitamin D deficiency, unspecified: Secondary | ICD-10-CM | POA: Diagnosis present

## 2024-07-15 DIAGNOSIS — D638 Anemia in other chronic diseases classified elsewhere: Secondary | ICD-10-CM | POA: Diagnosis present

## 2024-07-15 DIAGNOSIS — I132 Hypertensive heart and chronic kidney disease with heart failure and with stage 5 chronic kidney disease, or end stage renal disease: Secondary | ICD-10-CM | POA: Diagnosis present

## 2024-07-15 DIAGNOSIS — Z833 Family history of diabetes mellitus: Secondary | ICD-10-CM

## 2024-07-15 DIAGNOSIS — E1122 Type 2 diabetes mellitus with diabetic chronic kidney disease: Secondary | ICD-10-CM | POA: Diagnosis present

## 2024-07-15 DIAGNOSIS — N840 Polyp of corpus uteri: Secondary | ICD-10-CM | POA: Diagnosis present

## 2024-07-15 DIAGNOSIS — D631 Anemia in chronic kidney disease: Secondary | ICD-10-CM | POA: Diagnosis present

## 2024-07-15 DIAGNOSIS — K760 Fatty (change of) liver, not elsewhere classified: Secondary | ICD-10-CM | POA: Diagnosis present

## 2024-07-15 DIAGNOSIS — E1142 Type 2 diabetes mellitus with diabetic polyneuropathy: Secondary | ICD-10-CM | POA: Diagnosis present

## 2024-07-15 DIAGNOSIS — Z8249 Family history of ischemic heart disease and other diseases of the circulatory system: Secondary | ICD-10-CM

## 2024-07-15 DIAGNOSIS — R1310 Dysphagia, unspecified: Secondary | ICD-10-CM | POA: Diagnosis present

## 2024-07-15 DIAGNOSIS — Z6835 Body mass index (BMI) 35.0-35.9, adult: Secondary | ICD-10-CM

## 2024-07-15 DIAGNOSIS — Z8419 Family history of other disorders of kidney and ureter: Secondary | ICD-10-CM

## 2024-07-15 DIAGNOSIS — D500 Iron deficiency anemia secondary to blood loss (chronic): Secondary | ICD-10-CM | POA: Diagnosis present

## 2024-07-15 DIAGNOSIS — Z8679 Personal history of other diseases of the circulatory system: Secondary | ICD-10-CM

## 2024-07-15 LAB — COMPREHENSIVE METABOLIC PANEL WITH GFR
ALT: 100 U/L — ABNORMAL HIGH (ref 0–44)
AST: 110 U/L — ABNORMAL HIGH (ref 15–41)
Albumin: 3.7 g/dL (ref 3.5–5.0)
Alkaline Phosphatase: 75 U/L (ref 38–126)
Anion gap: 24 — ABNORMAL HIGH (ref 5–15)
BUN: 116 mg/dL — ABNORMAL HIGH (ref 6–20)
CO2: 13 mmol/L — ABNORMAL LOW (ref 22–32)
Calcium: 6.7 mg/dL — ABNORMAL LOW (ref 8.9–10.3)
Chloride: 109 mmol/L (ref 98–111)
Creatinine, Ser: 10.6 mg/dL — ABNORMAL HIGH (ref 0.44–1.00)
GFR, Estimated: 4 mL/min — ABNORMAL LOW
Glucose, Bld: 85 mg/dL (ref 70–99)
Potassium: 4.6 mmol/L (ref 3.5–5.1)
Sodium: 145 mmol/L (ref 135–145)
Total Bilirubin: 0.5 mg/dL (ref 0.0–1.2)
Total Protein: 6.8 g/dL (ref 6.5–8.1)

## 2024-07-15 LAB — CBC WITH DIFFERENTIAL/PLATELET
Abs Immature Granulocytes: 0.04 K/uL (ref 0.00–0.07)
Basophils Absolute: 0.1 K/uL (ref 0.0–0.1)
Basophils Relative: 1 %
Eosinophils Absolute: 0.4 K/uL (ref 0.0–0.5)
Eosinophils Relative: 5 %
HCT: 19.1 % — ABNORMAL LOW (ref 36.0–46.0)
Hemoglobin: 6.1 g/dL — CL (ref 12.0–15.0)
Immature Granulocytes: 0 %
Lymphocytes Relative: 18 %
Lymphs Abs: 1.7 K/uL (ref 0.7–4.0)
MCH: 28.4 pg (ref 26.0–34.0)
MCHC: 31.9 g/dL (ref 30.0–36.0)
MCV: 88.8 fL (ref 80.0–100.0)
Monocytes Absolute: 0.7 K/uL (ref 0.1–1.0)
Monocytes Relative: 8 %
Neutro Abs: 6.3 K/uL (ref 1.7–7.7)
Neutrophils Relative %: 68 %
Platelets: 202 K/uL (ref 150–400)
RBC: 2.15 MIL/uL — ABNORMAL LOW (ref 3.87–5.11)
RDW: 14.6 % (ref 11.5–15.5)
WBC: 9.2 K/uL (ref 4.0–10.5)
nRBC: 0 % (ref 0.0–0.2)

## 2024-07-15 LAB — PROTIME-INR
INR: 1.3 — ABNORMAL HIGH (ref 0.8–1.2)
Prothrombin Time: 17.3 s — ABNORMAL HIGH (ref 11.4–15.2)

## 2024-07-15 LAB — PRO BRAIN NATRIURETIC PEPTIDE: Pro Brain Natriuretic Peptide: 10336 pg/mL — ABNORMAL HIGH

## 2024-07-15 LAB — IRON AND TIBC
Iron: 47 ug/dL (ref 28–170)
Saturation Ratios: 27 % (ref 10.4–31.8)
TIBC: 176 ug/dL — ABNORMAL LOW (ref 250–450)
UIBC: 129 ug/dL

## 2024-07-15 LAB — TSH: TSH: 3.81 u[IU]/mL (ref 0.350–4.500)

## 2024-07-15 LAB — PHOSPHORUS: Phosphorus: 8.4 mg/dL — ABNORMAL HIGH (ref 2.5–4.6)

## 2024-07-15 LAB — OCCULT BLOOD X 1 CARD TO LAB, STOOL: Fecal Occult Bld: NEGATIVE

## 2024-07-15 LAB — TROPONIN T, HIGH SENSITIVITY: Troponin T High Sensitivity: 118 ng/L (ref 0–19)

## 2024-07-15 LAB — MAGNESIUM: Magnesium: 2.7 mg/dL — ABNORMAL HIGH (ref 1.7–2.4)

## 2024-07-15 LAB — FOLATE: Folate: 5.5 ng/mL — ABNORMAL LOW

## 2024-07-15 LAB — VITAMIN B12: Vitamin B-12: 1473 pg/mL — ABNORMAL HIGH (ref 180–914)

## 2024-07-15 LAB — HCG, SERUM, QUALITATIVE: Preg, Serum: NEGATIVE

## 2024-07-15 LAB — PREPARE RBC (CROSSMATCH)

## 2024-07-15 MED ORDER — FUROSEMIDE 10 MG/ML IJ SOLN: 80.0000 mg | Freq: Every day | INTRAMUSCULAR | Status: DC

## 2024-07-15 MED ORDER — MIDODRINE HCL 5 MG PO TABS: 5.0000 mg | ORAL_TABLET | Freq: Three times a day (TID) | ORAL | Status: DC

## 2024-07-15 MED ORDER — ACETAMINOPHEN 650 MG RE SUPP: 650.0000 mg | Freq: Four times a day (QID) | RECTAL | Status: DC | PRN

## 2024-07-15 MED ORDER — CHLORHEXIDINE GLUCONATE CLOTH 2 % EX PADS
6.0000 | MEDICATED_PAD | Freq: Every day | CUTANEOUS | Status: DC
  Administered 2024-07-16: 6 via TOPICAL

## 2024-07-15 MED ORDER — ACETAMINOPHEN 325 MG PO TABS
650.0000 mg | ORAL_TABLET | Freq: Once | ORAL | Status: AC
  Administered 2024-07-15: 650 mg via ORAL
  Filled 2024-07-15: qty 2

## 2024-07-15 MED ORDER — FLEET ENEMA RE ENEM: 1.0000 | ENEMA | Freq: Once | RECTAL | Status: DC | PRN

## 2024-07-15 MED ORDER — SODIUM CHLORIDE 0.9% IV SOLUTION: Freq: Once | INTRAVENOUS | Status: DC

## 2024-07-15 MED ORDER — ONDANSETRON HCL 4 MG/2ML IJ SOLN
4.0000 mg | Freq: Four times a day (QID) | INTRAMUSCULAR | Status: DC | PRN
  Administered 2024-07-17 – 2024-07-21 (×7): 4 mg via INTRAVENOUS
  Filled 2024-07-15 (×7): qty 2

## 2024-07-15 MED ORDER — FUROSEMIDE 10 MG/ML IJ SOLN: 20.0000 mg | Freq: Once | INTRAMUSCULAR | Status: DC

## 2024-07-15 MED ORDER — FUROSEMIDE 40 MG PO TABS: 80.0000 mg | ORAL_TABLET | Freq: Every day | ORAL | Status: DC

## 2024-07-15 MED ORDER — DILTIAZEM HCL ER COATED BEADS 120 MG PO CP24
120.0000 mg | ORAL_CAPSULE | Freq: Every morning | ORAL | Status: DC
  Administered 2024-07-15 – 2024-07-20 (×6): 120 mg via ORAL
  Filled 2024-07-15 (×7): qty 1

## 2024-07-15 MED ORDER — ONDANSETRON HCL 4 MG PO TABS
4.0000 mg | ORAL_TABLET | Freq: Four times a day (QID) | ORAL | Status: AC | PRN
  Administered 2024-07-20: 4 mg via ORAL
  Filled 2024-07-15 (×3): qty 1

## 2024-07-15 MED ORDER — FUROSEMIDE 10 MG/ML IJ SOLN
20.0000 mg | Freq: Once | INTRAMUSCULAR | Status: DC
  Filled 2024-07-15: qty 2

## 2024-07-15 MED ORDER — CALCITRIOL 0.5 MCG PO CAPS
0.5000 ug | ORAL_CAPSULE | Freq: Every day | ORAL | Status: DC
  Administered 2024-07-15 – 2024-07-22 (×8): 0.5 ug via ORAL
  Filled 2024-07-15 (×8): qty 1

## 2024-07-15 MED ORDER — TRAZODONE HCL 50 MG PO TABS
25.0000 mg | ORAL_TABLET | Freq: Every evening | ORAL | Status: DC | PRN
  Administered 2024-07-18 – 2024-07-19 (×2): 25 mg via ORAL
  Filled 2024-07-15 (×2): qty 1

## 2024-07-15 MED ORDER — FUROSEMIDE 10 MG/ML IJ SOLN
40.0000 mg | Freq: Once | INTRAMUSCULAR | Status: AC
  Administered 2024-07-15: 40 mg via INTRAVENOUS
  Filled 2024-07-15: qty 4

## 2024-07-15 MED ORDER — ACETAMINOPHEN 325 MG PO TABS
650.0000 mg | ORAL_TABLET | Freq: Four times a day (QID) | ORAL | Status: DC | PRN
  Administered 2024-07-19 – 2024-07-21 (×4): 650 mg via ORAL
  Filled 2024-07-15 (×5): qty 2

## 2024-07-15 MED ORDER — SODIUM CHLORIDE 0.9 % IV SOLN: INTRAVENOUS | Status: DC

## 2024-07-15 MED ORDER — SODIUM CHLORIDE 0.9% FLUSH
3.0000 mL | Freq: Two times a day (BID) | INTRAVENOUS | Status: DC
  Administered 2024-07-15 – 2024-07-22 (×14): 3 mL via INTRAVENOUS
  Filled 2024-07-15: qty 3

## 2024-07-15 MED ORDER — OXYCODONE HCL 5 MG PO TABS
5.0000 mg | ORAL_TABLET | ORAL | Status: DC | PRN
  Administered 2024-07-15 – 2024-07-22 (×9): 5 mg via ORAL
  Filled 2024-07-15 (×10): qty 1

## 2024-07-15 MED ORDER — CALCIUM ACETATE (PHOS BINDER) 667 MG PO CAPS
1334.0000 mg | ORAL_CAPSULE | Freq: Three times a day (TID) | ORAL | Status: DC
  Administered 2024-07-15 – 2024-07-22 (×9): 1334 mg via ORAL
  Filled 2024-07-15 (×13): qty 2

## 2024-07-15 MED ORDER — DIPHENHYDRAMINE HCL 25 MG PO CAPS
25.0000 mg | ORAL_CAPSULE | Freq: Once | ORAL | Status: AC
  Administered 2024-07-15: 25 mg via ORAL
  Filled 2024-07-15: qty 1

## 2024-07-15 MED ORDER — HEPARIN SODIUM (PORCINE) 5000 UNIT/ML IJ SOLN: 5000.0000 [IU] | Freq: Three times a day (TID) | INTRAMUSCULAR | Status: DC

## 2024-07-15 MED ORDER — BISACODYL 5 MG PO TBEC: 5.0000 mg | DELAYED_RELEASE_TABLET | Freq: Every day | ORAL | Status: DC | PRN

## 2024-07-15 MED ORDER — IPRATROPIUM BROMIDE 0.02 % IN SOLN: 0.5000 mg | Freq: Four times a day (QID) | RESPIRATORY_TRACT | Status: DC | PRN

## 2024-07-15 MED ORDER — HYDROMORPHONE HCL 1 MG/ML IJ SOLN
0.5000 mg | INTRAMUSCULAR | Status: AC | PRN
  Administered 2024-07-16 – 2024-07-22 (×30): 1 mg via INTRAVENOUS
  Filled 2024-07-15 (×30): qty 1

## 2024-07-15 MED ORDER — SENNOSIDES-DOCUSATE SODIUM 8.6-50 MG PO TABS: 1.0000 | ORAL_TABLET | Freq: Every evening | ORAL | Status: DC | PRN

## 2024-07-15 MED ORDER — HYDRALAZINE HCL 20 MG/ML IJ SOLN
10.0000 mg | INTRAMUSCULAR | Status: DC | PRN
  Administered 2024-07-15 – 2024-07-18 (×2): 10 mg via INTRAVENOUS
  Filled 2024-07-15 (×2): qty 1

## 2024-07-15 MED ORDER — SENNOSIDES-DOCUSATE SODIUM 8.6-50 MG PO TABS
1.0000 | ORAL_TABLET | Freq: Every day | ORAL | Status: DC
  Administered 2024-07-15 – 2024-07-19 (×2): 1 via ORAL
  Filled 2024-07-15 (×6): qty 1

## 2024-07-15 MED ORDER — CALCIUM GLUCONATE-NACL 1-0.675 GM/50ML-% IV SOLN
1.0000 g | Freq: Once | INTRAVENOUS | Status: AC
  Administered 2024-07-15: 1000 mg via INTRAVENOUS
  Filled 2024-07-15: qty 50

## 2024-07-15 NOTE — Assessment & Plan Note (Signed)
 HFpEF  Echo 09/2023, EF 60-65%, normal LV function, normal RV function, valves within normal limits,  -volume overload due to missed hemodialysis x 2 weeks -Volume adjustment with hemodialysis -Nephrologist consulted, appreciate hemodialysis -Patient makes some urine, will monitor daily weight Per patient 15 pound overweight with fluid -Continue with daily Lasix 

## 2024-07-15 NOTE — Assessment & Plan Note (Addendum)
 Monitoring LFTs likely worsened with congestion,  Albumin  3.7, AST 110, ALT 100 alk phos 75, T. bili 0.5  -Avoid hepatotoxic -including statins -Avoiding statins for now

## 2024-07-15 NOTE — ED Provider Notes (Signed)
 " [place] [facility] Provider Note   CSN: [id number] Arrival date & time: 07/15/24  9246     Patient presents with: Chest Pain   Jacqueline [name] is a 43 y.o. female.   Patient here chest pain shortness of breath and leg swelling.  She has not been able to complete dialysis for about 2 weeks due to some issues with her left upper arm fistula.  She does have some urine output at baseline but she feels like she is about 15 pounds above her dry weight.  Dialysis center told her to come to the ED.  She has history of hypertension diabetes morbid obesity.  She denies any cough fever chills.  The history is provided by the patient.       Prior to Admission medications  Medication Sig Start Date End Date Taking? Authorizing Provider  acetaminophen  (TYLENOL ) 500 MG tablet Take 1,000 mg by mouth 2 (two) times daily as needed for moderate pain (pain score 4-6), fever or headache.    [provider]  calcitRIOL  (ROCALTROL ) 0.5 MCG capsule Take 0.5 mcg by mouth daily. Prior to dialysis on Tuesday, Thursday, Saturdays only 10/03/23   [provider]  calcium  acetate (PHOSLO ) 667 MG capsule Take 1,334 mg by mouth 3 (three) times daily with meals.    [provider]  diltiazem  (CARDIZEM  CD) 120 MG 24 hr capsule Take 1 capsule (120 mg total) by mouth every morning. 02/16/24   Gherghe, Costin M, MD  furosemide  (LASIX ) 40 MG tablet Take 80 mg by mouth daily. 11/15/23   [provider]  megestrol  (MEGACE ) 40 MG tablet Take 1 tablet (40 mg total) by mouth 3 (three) times daily. 06/13/24   Vann, Jessica U, DO  midodrine  (PROAMATINE ) 5 MG tablet Take 5 mg by mouth 3 (three) times daily with meals.    [provider]  Vitamin D , Ergocalciferol , (DRISDOL ) 1.25 MG (50000 UNIT) CAPS capsule Take 1 capsule (50,000 Units total) by mouth every Thursday. 07/07/22   Angiulli, Toribio PARAS, PA-C    Allergies: Patient has no known allergies.     Review of Systems  Updated Vital Signs BP (!) 162/83 (BP Location: Right Arm)   Pulse (!) 111   Temp 98.3 F (36.8 C)   Resp 16   Wt 88.8 kg   LMP 06/15/2024 (Approximate)   SpO2 100%   BMI 33.60 kg/m   Physical Exam Vitals and nursing note reviewed.  Constitutional:      General: She is not in acute distress.    Appearance: She is well-developed.  HENT:     Head: Normocephalic and atraumatic.  Eyes:     Conjunctiva/sclera: Conjunctivae normal.  Cardiovascular:     Rate and Rhythm: Normal rate and regular rhythm.     Heart sounds: No murmur heard. Pulmonary:     Effort: Tachypnea present. No respiratory distress.     Breath sounds: Decreased breath sounds present.  Abdominal:     Palpations: Abdomen is soft.     Tenderness: There is no abdominal tenderness.  Musculoskeletal:        General: No swelling.     Cervical back: Neck supple.     Right lower leg: Edema present.     Left lower leg: Edema present.  Skin:    General: Skin is warm and dry.     Capillary Refill: Capillary refill takes less than 2 seconds.  Neurological:     Mental Status: She is  alert.  Psychiatric:        Mood and Affect: Mood normal.     (all labs ordered are listed, but only abnormal results are displayed) Labs Reviewed  CBC WITH DIFFERENTIAL/PLATELET - Abnormal; Notable for the following components:      Result Value   RBC 2.15 (*)    Hemoglobin 6.1 (*)    HCT 19.1 (*)    All other components within normal limits  COMPREHENSIVE METABOLIC PANEL WITH GFR - Abnormal; Notable for the following components:   CO2 13 (*)    BUN 116 (*)    Creatinine, Ser 10.60 (*)    Calcium  6.7 (*)    AST 110 (*)    ALT 100 (*)    GFR, Estimated 4 (*)    Anion gap 24 (*)    All other components within normal limits  PRO BRAIN NATRIURETIC PEPTIDE - Abnormal; Notable for the following components:   Pro Brain Natriuretic Peptide 10,336.0 (*)    All other components within normal limits  TROPONIN  T, HIGH SENSITIVITY - Abnormal; Notable for the following components:   Troponin T High Sensitivity 118 (*)    All other components within normal limits  HCG, SERUM, QUALITATIVE  OCCULT BLOOD X 1 CARD TO LAB, STOOL    EKG: EKG Interpretation Date/Time:  Monday July 15 2024 08:03:16 EST Ventricular Rate:  109 PR Interval:  140 QRS Duration:  77 QT Interval:  338 QTC Calculation: 456 R Axis:   113  Text Interpretation: Sinus tachycardia Confirmed by Ruthe Cornet [id number]) on 07/15/2024 8:07:14 AM  Radiology: DG Chest Portable 1 View Result Date: 07/15/2024 EXAM: 1 VIEW(S) XRAY OF THE CHEST 07/15/2024 08:23:00 AM COMPARISON: 06/10/2022 CLINICAL HISTORY: cp FINDINGS: LUNGS AND PLEURA: Low lung volume. Prominent pulmonary vasculature. No focal pulmonary opacity. No pleural effusion. No pneumothorax. HEART AND MEDIASTINUM: Mild cardiomegaly. BONES AND SOFT TISSUES: Chronic fracture deformity of left posterior sixth and seventh ribs. IMPRESSION: 1. No acute cardiopulmonary abnormality. 2. Mild cardiomegaly with prominent pulmonary vasculature. Electronically signed by: Waddell Calk MD 07/15/2024 08:32 AM EST RP Workstation: [id number]     .Critical Care  Performed by: Ruthe Cornet, DO Authorized by: Ruthe Cornet, DO   Critical care provider statement:    Critical care time (minutes):  35   Critical care was necessary to treat or prevent imminent or life-threatening deterioration of the following conditions:  Renal failure and cardiac failure   Critical care was time spent personally by me on the following activities:  Blood draw for specimens, development of treatment plan with patient or surrogate, evaluation of patient's response to treatment, examination of patient, discussions with consultants, discussions with primary provider, obtaining history from patient or surrogate, ordering and performing treatments and interventions, ordering and review of laboratory studies, pulse  oximetry, ordering and review of radiographic studies, re-evaluation of patient's condition and review of old charts   Care discussed with: admitting provider      Medications Ordered in the ED - No data to display                                  Medical Decision Making Amount and/or Complexity of Data Reviewed Labs: ordered. Radiology: ordered.  Risk Decision regarding hospitalization.   Jacqueline [name] is here with shortness of breath chest pain.  She looks like she has some mild increased work of breathing.  But for the most part the  vital signs are reassuring.  She has got anasarca on exam.  She says she is about 15 pounds above her dry weight has not been able to complete dialysis for 2 weeks because of fistula issues.  Was told to go to the ED because having hard time accessing her fistula.  Differential diagnosis likely volume overload likely from missed dialysis session seems less likely to be from infectious process or ACS.  Will get labs including CBC BMP troponin chest x-ray.  Will talk with nephrology once labs are back.  EKG shows sinus rhythm.  No obvious ischemic changes or hyperkalemic changes.  Per my review and interpretation of labs hemoglobin 6.1.  She is grossly with signs of volume overload on exam with a proBNP of 10,000 and troponin of 100.  Volume overload on chest x-ray.  But fortunately electrolytes are okay she is on room air.  Potassium is okay.  I talked with Dr. Jerrye with nephrology who will arrange for dialysis when she gets to her inpatient bed.  I do suspect anemia likely from chronic kidney disease.  Her vaginal bleeding has been very well-controlled since she started hormone therapy about a month ago.  Overall hemodynamically stable.  Will admit to the hospitalist for further supportive care.  She will need blood transfusion and dialysis when she gets to [facility].   This chart was dictated using voice recognition software.  Despite best efforts to proofread,   errors can occur which can change the documentation meaning.      Final diagnoses:  Symptomatic anemia  Hypervolemia, unspecified hypervolemia type    ED Discharge Orders     None          Ruthe Cornet, DO 07/15/24 9051  "

## 2024-07-15 NOTE — Assessment & Plan Note (Signed)
-   Missed hemodialysis x 2 weeks -Volume overload -needs hemodialysis -Issue with left arm fistula -EDP discussed with Dr. Jerrye -GI to evaluate, proceed with hemodialysis

## 2024-07-15 NOTE — Assessment & Plan Note (Signed)
 Chronic hypocalcemia, due to end-stage renal disease Serum calcium  6.7, obtaining ionized calcium , (albumin  normal at 3.7)

## 2024-07-15 NOTE — Assessment & Plan Note (Signed)
 Chronically elevated at 118 likely due to demand ischemia, volume overload CHF -Troponin 118 -Denies any chest pain - Will review EKG per EDP no changes

## 2024-07-15 NOTE — ED Notes (Signed)
 Assisted Pt. To restroom / one assist.  Pt. Is in no distress.  Pt. Reports she is having trouble with her fistula again and dialysis has been an issue for her.  Pt. Able to urinate.  Pt. Able to ambulate to restroom and back to the room.

## 2024-07-15 NOTE — Hospital Course (Addendum)
 Marja Sotomayor is a 43 year old female with extensive history of HTN, DM II, Hypotension, OSA vitamin D  deficiency, Anemia of chronic disease, morbid obesity, ESRD on hemodialysis. Was not be able to complete dialysis x 2 weeks due to issue with left upper arm.  Patient believes she has gained approximately 15 pounds.  Complaining of shortness of breath, denies any chest pain.  Denies any recent illness discharge or fever, chills, nausea vomiting constipation or diarrhea. Denies of having any further vaginal bleeding since hormonal therapy last month.  Contacted dialysis center who told her to report to the ED.  ED Evaluation:  Blood pressure (!) 162/83, pulse (!) 111, temperature 98.3 F (36.8 C), resp. rate 16, weight 88.8 kg, last menstrual period 06/15/2024, SpO2 100% on RA  LABs: CO2 13 BUN 116 creatinine 10.60, calcium  6.7, AST 110, ALT 100, BNP 10,336.0, troponin 118, WBC 9.2, hemoglobin 6.1  Hemoglobin negative. Patient reports vaginal bleeding has been well-controlled since she started hormone therapy last month.    EDP Dr. Evalyn discussed with Dr. Jerrye with nephrology who will arrange for dialysis.

## 2024-07-15 NOTE — Assessment & Plan Note (Signed)
 Currently patient is hypertensive -Discontinue home medication of midodrine  -As needed IV hydralazine  -Resuming patient home medication of Cardizem , Lasix , Anticipating additional meds for hypertension before discharge

## 2024-07-15 NOTE — Assessment & Plan Note (Addendum)
 Hemoglobin 6.1, complaining shortness of breath, generalized weakness, Pursuing blood transfusion 2 units - No signs of bleeding, Hemoccult negative

## 2024-07-15 NOTE — Progress Notes (Signed)
 " PROGRESS NOTE    Patient: Jacqueline [name]                            PCP: [name], MD                    DOB: [birth year]            DOA: 07/15/2024 [id number]             DOS: 07/15/2024, 10:18 AM   LOS: 0 days   Date of Service: The patient was seen and examined on 07/15/2024  Brief Narrative:   Jacqueline [name] is a 43 year old female with extensive history of HTN, DM II, Hypotension, OSA vitamin D  deficiency, Anemia of chronic disease, morbid obesity, ESRD on hemodialysis. Was not be able to complete dialysis x 2 weeks due to issue with left upper arm.  Patient believes she has gained approximately 15 pounds.  Complaining of shortness of breath, denies any chest pain.  Denies any recent illness discharge or fever, chills, nausea vomiting constipation or diarrhea. Denies of having any further vaginal bleeding since hormonal therapy last month.  Contacted dialysis center who told her to report to the ED.  ED Evaluation:  Blood pressure (!) 162/83, pulse (!) 111, temperature 98.3 F (36.8 C), resp. rate 16, weight 88.8 kg, last menstrual period 06/15/2024, SpO2 100% on RA  LABs: CO2 13 BUN 116 creatinine 10.60, calcium  6.7, AST 110, ALT 100, BNP 10,336.0, troponin 118, WBC 9.2, hemoglobin 6.1  Hemoglobin negative. Patient reports vaginal bleeding has been well-controlled since she started hormone therapy last month.    EDP Dr. Evalyn discussed with Dr. Jerrye with nephrology who will arrange for dialysis.    Requesting a telemetry bed at [facility].  Ordering 2U PRBC blood transfusion.          ==================================================================================  LABs:     Latest Ref Rng & Units 07/15/2024    8:32 AM 06/12/2024    3:48 AM 06/11/2024    2:32 AM  CBC  WBC 4.0 - 10.5 K/uL 9.2  7.7  8.3   Hemoglobin 12.0 - 15.0 g/dL 6.1  8.5  8.1   Hematocrit 36.0 - 46.0 % 19.1  26.0  23.6   Platelets 150 - 400 K/uL 202  226  221       Latest  Ref Rng & Units 07/15/2024    8:32 AM 06/13/2024    3:33 AM 06/12/2024    3:48 AM  CMP  Glucose 70 - 99 mg/dL 85  96  95   BUN 6 - 20 mg/dL 883  28  33   Creatinine 0.44 - 1.00 mg/dL 89.39  5.99  4.58   Sodium 135 - 145 mmol/L 145  138  140   Potassium 3.5 - 5.1 mmol/L 4.6  4.8  4.3   Chloride 98 - 111 mmol/L 109  100  103   CO2 22 - 32 mmol/L 13  24  27    Calcium  8.9 - 10.3 mg/dL 6.7  9.2  9.0   Total Protein 6.5 - 8.1 g/dL 6.8     Total Bilirubin 0.0 - 1.2 mg/dL 0.5     Alkaline Phos 38 - 126 U/L 75     AST 15 - 41 U/L 110     ALT 0 - 44 U/L 100          Micro Results No results found for this or  any previous visit (from the past 240 hours).  Radiology Reports DG Chest Portable 1 View Result Date: 07/15/2024 EXAM: 1 VIEW(S) XRAY OF THE CHEST 07/15/2024 08:23:00 AM COMPARISON: 06/10/2022 CLINICAL HISTORY: cp FINDINGS: LUNGS AND PLEURA: Low lung volume. Prominent pulmonary vasculature. No focal pulmonary opacity. No pleural effusion. No pneumothorax. HEART AND MEDIASTINUM: Mild cardiomegaly. BONES AND SOFT TISSUES: Chronic fracture deformity of left posterior sixth and seventh ribs. IMPRESSION: 1. No acute cardiopulmonary abnormality. 2. Mild cardiomegaly with prominent pulmonary vasculature. Electronically signed by: Waddell Calk MD 07/15/2024 08:32 AM EST RP Workstation: [id number]    SIGNED: Adriana DELENA Grams, MD, FHM. FAAFP. [place] [facility] Time spent - 55 min.  In seeing, evaluating and examining the patient. Reviewing medical records, labs, drawn plan of care. [facility],  Pager (please use [email or web address] to page/ text) Please use Epic Secure Chat for non-urgent communication (7AM-7PM)  If 7PM-7AM, please contact night-coverage [email or web address], 07/15/2024, 10:18 AM     "

## 2024-07-15 NOTE — ED Notes (Signed)
 Spoke with Pharmacy about the Pt. Meds and none are due at this time. Pt. Will get her meds at her admission site.  Pt. In no distress.

## 2024-07-15 NOTE — ED Triage Notes (Signed)
 Dialysis patient , was unable to do it last week due to difficult access . Last complete  dialysis 2 weeks ago  Reports mid chest pain x 2 days , getting progressively worse , bilateral ankle and leg edema .  Voided last night , normal output amount per pt .

## 2024-07-15 NOTE — Assessment & Plan Note (Signed)
 Continue O2 as needed, CPAP nightly as needed

## 2024-07-15 NOTE — Plan of Care (Signed)
  Problem: Education: Goal: Knowledge of General Education information will improve Description: Including pain rating scale, medication(s)/side effects and non-pharmacologic comfort measures Outcome: Progressing   Problem: Clinical Measurements: Goal: Ability to maintain clinical measurements within normal limits will improve Outcome: Progressing Goal: Will remain free from infection Outcome: Progressing   Problem: Nutrition: Goal: Adequate nutrition will be maintained Outcome: Progressing   Problem: Pain Managment: Goal: General experience of comfort will improve and/or be controlled Outcome: Progressing   Problem: Safety: Goal: Ability to remain free from injury will improve Outcome: Progressing   Problem: Skin Integrity: Goal: Risk for impaired skin integrity will decrease Outcome: Progressing

## 2024-07-15 NOTE — ED Notes (Signed)
 [facility] with [facility] about the meds on [facility] and how she will get these meds before she gets these before the transfusion he will see on the [facility].

## 2024-07-15 NOTE — H&P (Signed)
 " History and Physical   Patient: Jacqueline [name]                            PCP: [name], MD                    DOB: 25-Apr-1981            DOA: 07/15/2024 FMW:[id number]             DOS: 07/15/2024, 5:07 PM  [name], MD  Patient coming from:   HOME  I have personally reviewed patient's medical records, in electronic medical records, including:  [place] [facility], and care everywhere.    Chief Complaint:   Chief Complaint  Patient presents with   Shortness of breath/weight gain    History of present illness:    Jacqueline [name] is a 43 year old female with extensive history of HTN, DM II, Hypotension, OSA vitamin D  deficiency, Anemia of chronic disease, morbid obesity, ESRD on hemodialysis. Was not be able to complete dialysis x 2 weeks due to issue with left upper arm.  Patient believes she has gained approximately 15 pounds.  Complaining of shortness of breath, denies any chest pain.  Denies any recent illness discharge or fever, chills, nausea vomiting constipation or diarrhea. Denies of having any further vaginal bleeding since hormonal therapy last month.  Contacted dialysis center who told her to report to the ED.  ED Evaluation:  Blood pressure (!) 162/83, pulse (!) 111, temperature 98.3 F (36.8 C), resp. rate 16, weight 88.8 kg, last menstrual period 06/15/2024, SpO2 100% on RA  LABs: CO2 13 BUN 116 creatinine 10.60, calcium  6.7, AST 110, ALT 100, BNP 10,336.0, troponin 118, WBC 9.2, hemoglobin 6.1  Hemoglobin negative. Patient reports vaginal bleeding has been well-controlled since she started hormone therapy last month.    EDP Dr. Evalyn discussed with Dr. Jerrye with nephrology who will arrange for dialysis.    2U PRBC blood has been ordered for transfusion--    Patient Denies having: Fever, Chills, Cough,  Chest Pain, Abd pain, N/V/D, headache, dizziness, lightheadedness,  Dysuria, Joint pain, rash, open wounds     Review of Systems: As  per HPI, otherwise 10 point review of systems were negative.   ----------------------------------------------------------------------------------------------------------------------  Allergies[1]  Home MEDs:  Prior to Admission medications  Medication Sig Start Date End Date Taking? Authorizing Provider  acetaminophen  (TYLENOL ) 500 MG tablet Take 1,000 mg by mouth 2 (two) times daily as needed for moderate pain (pain score 4-6), fever or headache.   Yes [provider]  calcitRIOL  (ROCALTROL ) 0.5 MCG capsule Take 0.5 mcg by mouth daily. Prior to dialysis on Tuesday, Thursday, Saturdays only 10/03/23  Yes [provider]  calcium  acetate (PHOSLO ) 667 MG capsule Take 1,334 mg by mouth 3 (three) times daily with meals.   Yes [provider]  diltiazem  (CARDIZEM  CD) 120 MG 24 hr capsule Take 1 capsule (120 mg total) by mouth every morning. 02/16/24  Yes Gherghe, Costin M, MD  furosemide  (LASIX ) 40 MG tablet Take 80 mg by mouth daily. 11/15/23  Yes [provider]  megestrol  (MEGACE ) 40 MG tablet Take 1 tablet (40 mg total) by mouth 3 (three) times daily. 06/13/24  Yes Vann, Jessica U, DO  Methoxy PEG-Epoetin  Beta (MIRCERA IJ) 200 mcg. During Dialysis, Every 2 weeks 06/18/24 06/17/25 Yes [provider]  midodrine  (PROAMATINE ) 5 MG tablet Take 5 mg by mouth 3 (  three) times daily with meals.   Yes [provider]  Vitamin D , Ergocalciferol , (DRISDOL ) 1.25 MG (50000 UNIT) CAPS capsule Take 1 capsule (50,000 Units total) by mouth every Thursday. 07/07/22  Yes Angiulli, Toribio PARAS, PA-C    PRN MEDs: acetaminophen  **OR** acetaminophen , hydrALAZINE , HYDROmorphone  (DILAUDID ) injection, ipratropium, ondansetron  **OR** ondansetron  (ZOFRAN ) IV, oxyCODONE , traZODone  Past Medical History:  Diagnosis Date   Anemia    Anxiety    Chronic bronchitis (HCC)    Chronic kidney disease    Diabetes (HCC) 01/01/2014   Hypertension    Increased frequency of headaches     Morbid obesity (HCC)    Necrotizing fasciitis (HCC)    Sleep apnea    does not use CPAP - - gastric bypass   Type II diabetes mellitus (HCC)    no meds, diet controlled    Past Surgical History:  Procedure Laterality Date   A/V FISTULAGRAM N/A 06/12/2024   Procedure: A/V Fistulagram;  Surgeon: Gretta Lonni PARAS, MD;  Location: [facility] INVASIVE [facility];  Service: Cardiovascular;  Laterality: N/A;   A/V SHUNT INTERVENTION N/A 11/17/2023   Procedure: A/V SHUNT INTERVENTION;  Surgeon: Tobie Gordy POUR, MD;  Location: [facility] INVASIVE CV LAB;  Service: Cardiovascular;  Laterality: N/A;  80% venous anastomosis   A/V SHUNT INTERVENTION Left 05/10/2024   Procedure: A/V SHUNT INTERVENTION;  Surgeon: Serene Gaile ORN, MD;  Location: [facility];  Service: Cardiovascular;  Laterality: Left;   AV FISTULA PLACEMENT Left 05/02/2023   Procedure: LEFT ARM ARTERIOVENOUS (AV) FISTULA  GRAFT;  Surgeon: Pearline Norman RAMAN, MD;  Location: [facility] OR;  Service: Vascular;  Laterality: Left;   BARIATRIC SURGERY     BREAST REDUCTION SURGERY  03/21/2017   CARDIAC CATHETERIZATION  01/06/2016   CARDIAC CATHETERIZATION N/A 01/06/2016   Procedure: Left Heart Cath and Coronary Angiography;  Surgeon: Deatrice DELENA Cage, MD;  Location: [facility] INV[facility];  Service: Cardiovascular;  Laterality: N/A;   CESAREAN SECTION  08/2014   I & D EXTREMITY Right 06/18/2022   Procedure: IRRIGATION AND DEBRIDEMENT RIGHT ABDOMEN AND THIGH;  Surgeon: Kinsinger, Herlene Righter, MD;  Location: [facility] OR;  Service: General;  Laterality: Right;   I & D EXTREMITY Right 06/20/2022   Procedure: WOUND EXPLORATION OF RIGHT THIGH AND RIGHT GROIN WITH IRRIGATION AND DEBRIDEMENT;  Surgeon: Curvin Deward MOULD, MD;  Location: [facility] OR;  Service: General;  Laterality: Right;   IR AV DIALY SHUNT INTRO NEEDLE/INTRACATH INITIAL W/PTA/IMG LEFT  10/19/2023   IR AV DIALY SHUNT INTRO NEEDLE/INTRACATH INITIAL W/PTA/IMG LEFT  02/13/2024   IR DIALY SHUNT INTRO NEEDLE/INTRACATH INITIAL W/IMG LEFT Left  11/29/2023   IR FLUORO GUIDE CV LINE RIGHT  02/10/2024   IR FLUORO GUIDE CV LINE RIGHT  02/15/2024   IR US  GUIDE VASC ACCESS LEFT  02/13/2024   IR US  GUIDE VASC ACCESS RIGHT  02/10/2024   REDUCTION MAMMAPLASTY Bilateral 03/21/2017   TRANSESOPHAGEAL ECHOCARDIOGRAM (CATH LAB) N/A 10/16/2023   Procedure: TRANSESOPHAGEAL ECHOCARDIOGRAM;  Surgeon: Barbaraann Darryle Ned, MD;  Location: [facility] INVASIVE CV LAB;  Service: Cardiovascular;  Laterality: N/A;   UPPER EXTREMITY INTERVENTION  06/12/2024   Procedure: UPPER EXTREMITY INTERVENTION;  Surgeon: Gretta Lonni PARAS, MD;  Location: [facility];  Service: Cardiovascular;;   VENOUS ANGIOPLASTY Left 05/10/2024   Procedure: VENOUS ANGIOPLASTY;  Surgeon: Serene Gaile ORN, MD;  Location: [facility];  Service: Cardiovascular;  Laterality: Left;  Venous Anastomosis   VENOUS STENT Left 05/10/2024   Procedure: VENOUS STENT;  Surgeon:  Serene Gaile ORN, MD;  Location: [facility];  Service: Cardiovascular;  Laterality: Left;  8x10 Viabahn     reports that she has never smoked. She has never used smokeless tobacco. She reports that she does not drink alcohol and does not use drugs.   Family History  Problem Relation Age of Onset   Diabetes Mother    Hypertension Mother    Thyroid  disease Mother    Kidney disease Maternal Grandmother    Diabetes Maternal Grandmother    Heart attack Other     Physical Exam:   Vitals:   07/15/24 1247 07/15/24 1330 07/15/24 1430 07/15/24 1436  BP: (S) (!) 166/84 (!) 167/88 (!) 175/88   Pulse: (!) 107 (!) 111 (!) 108   Resp: 17 (!) 22 17   Temp: 98.8 F (37.1 C)  98.2 F (36.8 C)   TempSrc: Oral     SpO2: 98% 97% 100%   Weight:    87 kg   Constitutional: NAD, calm, comfortable Eyes: PERRL, lids and conjunctivae normal ENMT: Mucous membranes are moist. Posterior pharynx clear of any exudate or lesions.Normal dentition.  Neck: normal, supple, no masses, no thyromegaly Respiratory: clear to auscultation bilaterally,  no wheezing, no crackles. Normal respiratory effort. No accessory muscle use.  Cardiovascular: Regular rate and rhythm, no murmurs / rubs / gallops. +4 extremity edema.  2+ pedal pulses. No carotid bruits.  Abdomen: no tenderness, no masses palpated. No hepatosplenomegaly. Bowel sounds positive.  Musculoskeletal: no clubbing / cyanosis. No joint deformity upper and lower extremities. Good ROM, no contractures. Normal muscle tone.  Neurologic: CN II-XII grossly intact. Sensation intact, DTR normal. Strength 5/5 in all 4.  Psychiatric: Normal judgment and insight. Alert and oriented x 3. Normal mood.  Skin: no rashes, lesions, ulcers. No induration Extensive lower bilateral lower extremity edema +4, left upper extremity edema fistula with hematoma       Labs on admission:    I have personally reviewed following labs and imaging studies  CBC: Recent Labs  Lab 07/15/24 0832  WBC 9.2  NEUTROABS 6.3  HGB 6.1*  HCT 19.1*  MCV 88.8  PLT 202   Basic Metabolic Panel: Recent Labs  Lab 07/15/24 0832 07/15/24 1012  NA 145  --   K 4.6  --   CL 109  --   CO2 13*  --   GLUCOSE 85  --   BUN 116*  --   CREATININE 10.60*  --   CALCIUM  6.7*  --   MG  --  2.7*  PHOS  --  8.4*   GFR: Estimated Creatinine Clearance: 7.2 mL/min (A) (by C-G formula based on SCr of 10.6 mg/dL (H)). Liver Function Tests: Recent Labs  Lab 07/15/24 0832  AST 110*  ALT 100*  ALKPHOS 75  BILITOT 0.5  PROT 6.8  ALBUMIN  3.7   No results for input(s): LIPASE, AMYLASE in the last 168 hours. No results for input(s): AMMONIA in the last 168 hours. Coagulation Profile: Recent Labs  Lab 07/15/24 1012  INR 1.3*   Cardiac Enzymes: No results for input(s): CKTOTAL, CKMB, CKMBINDEX, TROPONINI in the last 168 hours. BNP (last 3 results) Recent Labs    06/10/24 0921 07/15/24 0832  PROBNP 4,761.0* 10,336.0*   HbA1C: No results for input(s): HGBA1C in the last 72 hours. CBG: No results  for input(s): GLUCAP in the last 168 hours. Lipid Profile: No results for input(s): CHOL, HDL, LDLCALC, TRIG, CHOLHDL, LDLDIRECT in the last 72 hours. Thyroid  Function  Tests: Recent Labs    07/15/24 1021  TSH 3.810   Anemia Panel: Recent Labs    07/15/24 1020 07/15/24 1021  VITAMINB12 1,473*  --   FOLATE  --  5.5*  TIBC 176*  --   IRON 47  --    Urine analysis:    Component Value Date/Time   COLORURINE YELLOW 05/11/2024 0854   APPEARANCEUR CLEAR 05/11/2024 0854   LABSPEC 1.010 05/11/2024 0854   PHURINE 8.0 05/11/2024 0854   GLUCOSEU 50 (A) 05/11/2024 0854   HGBUR NEGATIVE 05/11/2024 0854   BILIRUBINUR NEGATIVE 05/11/2024 0854   KETONESUR NEGATIVE 05/11/2024 0854   PROTEINUR 100 (A) 05/11/2024 0854   UROBILINOGEN 0.2 08/21/2012 2130   NITRITE NEGATIVE 05/11/2024 0854   LEUKOCYTESUR NEGATIVE 05/11/2024 0854    Last A1C:  Lab Results  Component Value Date   HGBA1C 4.3 (L) 02/09/2024     Radiologic Exams on Admission:   DG Chest Portable 1 View Result Date: 07/15/2024 EXAM: 1 VIEW(S) XRAY OF THE CHEST 07/15/2024 08:23:00 AM COMPARISON: 06/10/2022 CLINICAL HISTORY: cp FINDINGS: LUNGS AND PLEURA: Low lung volume. Prominent pulmonary vasculature. No focal pulmonary opacity. No pleural effusion. No pneumothorax. HEART AND MEDIASTINUM: Mild cardiomegaly. BONES AND SOFT TISSUES: Chronic fracture deformity of left posterior sixth and seventh ribs. IMPRESSION: 1. No acute cardiopulmonary abnormality. 2. Mild cardiomegaly with prominent pulmonary vasculature. Electronically signed by: Waddell Calk MD 07/15/2024 08:32 AM EST RP Workstation: [id number]    EKG:   Independently reviewed.  Orders placed or performed during the hospital encounter of 07/15/24   EKG 12-Lead   EKG 12-Lead   EKG   EKG   EKG 12-Lead   EKG   EKG   EKG   EKG    ---------------------------------------------------------------------------------------------------------------------------------------    Assessment / Plan:   Principal Problem:   ESRD (end stage renal disease) (HCC) Active Problems:   Anemia of chronic disease   Symptomatic anemia   PAF (paroxysmal atrial fibrillation) (HCC)   Elevated troponin   Acute on chronic diastolic CHF (congestive heart failure) (HCC)   OSA (obstructive sleep apnea)   Diabetic peripheral neuropathy (HCC)   Hypocalcemia due to chronic kidney disease   History of hypotension   Obesity, Class III, BMI 40-49.9 (morbid obesity) (HCC)   Steatosis of liver   Vitamin D  deficiency   Assessment and Plan: * ESRD (end stage renal disease) (HCC) - Missed hemodialysis x 2 weeks -Volume overload -needs hemodialysis -Issue with left arm fistula -EDP discussed with Dr. Jerrye -GI to evaluate, proceed with hemodialysis  Symptomatic anemia Hemoglobin 6.1, complaining shortness of breath, generalized weakness, Pursuing blood transfusion 2 units - No signs of bleeding, Hemoccult negative  Anemia of chronic disease Symptomatic anemia with a hemoglobin of 6.1 -complaining of shortness of breath, generalized weakness - anemia of chronic disease, end-stage renal disease -Hemoglobin 6.1 (baseline hemoglobin around 8.0) - Hemoccult negative   - Studies total iron 47, TIBC  low at 176, folate 5.5  -Transfusing 2U PRBC -with slow transfusion, check an H&H  History of hypotension Currently patient is hypertensive -Discontinue home medication of midodrine  -As needed IV hydralazine  -Resuming patient home medication of Cardizem , Lasix , Anticipating additional meds for hypertension before discharge  Hypocalcemia due to chronic kidney disease Chronic hypocalcemia, due to end-stage renal disease Serum calcium  6.7, obtaining ionized calcium , (albumin  normal at 3.7)   Diabetic peripheral neuropathy (HCC) Currently  stable  On any medication currently.  OSA (obstructive sleep apnea) Continue O2 as needed, CPAP nightly as needed  Acute on chronic diastolic CHF (congestive heart failure) (HCC) HFpEF  Echo 09/2023, EF 60-65%, normal LV function, normal RV function, valves within normal limits,  -volume overload due to missed hemodialysis x 2 weeks -Volume adjustment with hemodialysis -Nephrologist consulted, appreciate hemodialysis -Patient makes some urine, will monitor daily weight Per patient 15 pound overweight with fluid -Continue with daily Lasix   Elevated troponin Chronically elevated at 118 likely due to demand ischemia, volume overload CHF -Troponin 118 -Denies any chest pain - Will review EKG per EDP no changes   PAF (paroxysmal atrial fibrillation) (HCC) Rate controlled with Cardizem  CD120 mg daily, No chronic anticoagulation listed Confirmed with pharmacy and patient (likely due to persistent history of severe anemia -no active bleeding site identified)  Vitamin D  deficiency Vitamin D  supplements  Steatosis of liver Monitoring LFTs likely worsened with congestion,  Albumin  3.7, AST 110, ALT 100 alk phos 75, T. bili 0.5  -Avoid hepatotoxic -including statins -Avoiding statins for now  Obesity, Class III, BMI 40-49.9 (morbid obesity) (HCC) Body mass index is 32.92 kg/m. - Follow-up as an outpatient with PCP for weight loss         Consults called: Nephrology -------------------------------------------------------------------------------------------------------------------------------------------- DVT prophylaxis:  Place and maintain sequential compression device Start: 07/15/24 1022 Place TED hose Start: 07/15/24 1013 SCDs Start: 07/15/24 1012   Code Status:   Code Status: Full Code   Admission status: Patient will be admitted as Observation, with a greater than 2 midnight length of stay. Level of care: Telemetry   Family Communication:  none at bedside   (The above findings and plan of care has been discussed with patient in detail, the patient expressed understanding and agreement of above plan)  --------------------------------------------------------------------------------------------------------------------------------------------------  Disposition Plan:  Anticipated 1-2 days Status is: Observation The patient remains OBS appropriate and will d/c before 2 midnights.     ----------------------------------------------------------------------------------------------------------------------------------------------------  Time spent:  37  Min.  Was spent seeing and evaluating the patient, reviewing all medical records, drawn plan of care.  SIGNED: Adriana DELENA Grams, MD, FHM. FAAFP. [facility], Pager  (Please use [email or web address] to page/ or secure chat through epic) If 7PM-7AM, please contact night-coverage [email or web address],  07/15/2024, 5:07 PM     [1] No Known Allergies  "

## 2024-07-15 NOTE — ED Notes (Signed)
 Difficult access , will need US  IV access

## 2024-07-15 NOTE — Assessment & Plan Note (Addendum)
 Rate controlled with Cardizem  CD120 mg daily, No chronic anticoagulation listed Confirmed with pharmacy and patient (likely due to persistent history of severe anemia -no active bleeding site identified)

## 2024-07-15 NOTE — Consult Note (Signed)
 [facility] Renal Consultation Note  Requesting MD: Juliene Bicker, DO Indication for Consultation:  fluid overload and needs HD  Chief complaint: leg and feet swelling  HPI:  Jacqueline [name] is a 43 y.o. female with a history of ESRD on HD, HTN, diabetes mellitus and morbid obesity who presented to the ER at [facility].  She reported not getting good or much dialysis for 2 weeks due to the access not functioning well.  She states last Tuesday she had HD for less than an hour and then had an infiltration.  Then she didn't have HD again outpatient and her outpatient unit directed her to the ER.  She states last full HD treatment was likely 2 weeks ago.  She has gained fluid weight - reportedly 15 lbs.  PRBC's were ordered - not able to be transfused at [facility].  She has been short of breath and on room air.  She has arrived at [facility].  She has been ordered 2 units of PRBC's and is currently getting one unit.  She got lasix  40 mg IV once.      PMHx:   Past Medical History:  Diagnosis Date   Anemia    Anxiety    Chronic bronchitis (HCC)    Chronic kidney disease    Diabetes (HCC) 01/01/2014   Hypertension    Increased frequency of headaches    Morbid obesity (HCC)    Necrotizing fasciitis (HCC)    Sleep apnea    does not use CPAP - - gastric bypass   Type II diabetes mellitus (HCC)    no meds, diet controlled    Past Surgical History:  Procedure Laterality Date   A/V FISTULAGRAM N/A 06/12/2024   Procedure: A/V Fistulagram;  Surgeon: Gretta Lonni PARAS, MD;  Location: [facility] INVASIVE CV LAB;  Service: Cardiovascular;  Laterality: N/A;   A/V SHUNT INTERVENTION N/A 11/17/2023   Procedure: A/V SHUNT INTERVENTION;  Surgeon: Tobie Gordy POUR, MD;  Location: [facility] INVASIVE CV LAB;  Service: Cardiovascular;  Laterality: N/A;  80% venous anastomosis   A/V SHUNT INTERVENTION Left 05/10/2024   Procedure: A/V SHUNT INTERVENTION;  Surgeon: Serene Gaile ORN, MD;  Location:  [facility] PV LAB;  Service: Cardiovascular;  Laterality: Left;   AV FISTULA PLACEMENT Left 05/02/2023   Procedure: LEFT ARM ARTERIOVENOUS (AV) FISTULA  GRAFT;  Surgeon: Pearline Norman RAMAN, MD;  Location: [facility] OR;  Service: Vascular;  Laterality: Left;   BARIATRIC SURGERY     BREAST REDUCTION SURGERY  03/21/2017   CARDIAC CATHETERIZATION  01/06/2016   CARDIAC CATHETERIZATION N/A 01/06/2016   Procedure: Left Heart Cath and Coronary Angiography;  Surgeon: Deatrice DELENA Cage, MD;  Location: [facility] INVASIVE CV LAB;  Service: Cardiovascular;  Laterality: N/A;   CESAREAN SECTION  08/2014   I & D EXTREMITY Right 06/18/2022   Procedure: IRRIGATION AND DEBRIDEMENT RIGHT ABDOMEN AND THIGH;  Surgeon: Kinsinger, Herlene Righter, MD;  Location: [facility] OR;  Service: General;  Laterality: Right;   I & D EXTREMITY Right 06/20/2022   Procedure: WOUND EXPLORATION OF RIGHT THIGH AND RIGHT GROIN WITH IRRIGATION AND DEBRIDEMENT;  Surgeon: Curvin Deward MOULD, MD;  Location: [facility] OR;  Service: General;  Laterality: Right;   IR AV DIALY SHUNT INTRO NEEDLE/INTRACATH INITIAL W/PTA/IMG LEFT  10/19/2023   IR AV DIALY SHUNT INTRO NEEDLE/INTRACATH INITIAL W/PTA/IMG LEFT  02/13/2024   IR DIALY SHUNT INTRO NEEDLE/INTRACATH INITIAL W/IMG LEFT Left 11/29/2023   IR FLUORO GUIDE CV LINE RIGHT  02/10/2024  IR FLUORO GUIDE CV LINE RIGHT  02/15/2024   IR US  GUIDE VASC ACCESS LEFT  02/13/2024   IR US  GUIDE VASC ACCESS RIGHT  02/10/2024   REDUCTION MAMMAPLASTY Bilateral 03/21/2017   TRANSESOPHAGEAL ECHOCARDIOGRAM (CATH LAB) N/A 10/16/2023   Procedure: TRANSESOPHAGEAL ECHOCARDIOGRAM;  Surgeon: Barbaraann Darryle Ned, MD;  Location: [facility] INVASIVE CV LAB;  Service: Cardiovascular;  Laterality: N/A;   UPPER EXTREMITY INTERVENTION  06/12/2024   Procedure: UPPER EXTREMITY INTERVENTION;  Surgeon: Gretta Lonni PARAS, MD;  Location: [facility] INVASIVE CV LAB;  Service: Cardiovascular;;   VENOUS ANGIOPLASTY Left 05/10/2024   Procedure: VENOUS ANGIOPLASTY;  Surgeon: Serene Gaile ORN, MD;   Location: [facility] PV LAB;  Service: Cardiovascular;  Laterality: Left;  Venous Anastomosis   VENOUS STENT Left 05/10/2024   Procedure: VENOUS STENT;  Surgeon: Serene Gaile ORN, MD;  Location: [facility] PV LAB;  Service: Cardiovascular;  Laterality: Left;  8x10 Viabahn    Family Hx:  Family History  Problem Relation Age of Onset   Diabetes Mother    Hypertension Mother    Thyroid  disease Mother    Kidney disease Maternal Grandmother    Diabetes Maternal Grandmother    Heart attack Other   Multiple aunts with CKD or ESRD on mother's side    Social History:  reports that she has never smoked. She has never used smokeless tobacco. She reports that she does not drink alcohol and does not use drugs.  Allergies: Allergies[1]  Medications: Prior to Admission medications  Medication Sig Start Date End Date Taking? Authorizing Provider  acetaminophen  (TYLENOL ) 500 MG tablet Take 1,000 mg by mouth 2 (two) times daily as needed for moderate pain (pain score 4-6), fever or headache.   Yes [provider]  calcitRIOL  (ROCALTROL ) 0.5 MCG capsule Take 0.5 mcg by mouth daily. Prior to dialysis on Tuesday, Thursday, Saturdays only 10/03/23  Yes [provider]  calcium  acetate (PHOSLO ) 667 MG capsule Take 1,334 mg by mouth 3 (three) times daily with meals.   Yes [provider]  diltiazem  (CARDIZEM  CD) 120 MG 24 hr capsule Take 1 capsule (120 mg total) by mouth every morning. 02/16/24  Yes Gherghe, Costin M, MD  furosemide  (LASIX ) 40 MG tablet Take 80 mg by mouth daily. 11/15/23  Yes [provider]  megestrol  (MEGACE ) 40 MG tablet Take 1 tablet (40 mg total) by mouth 3 (three) times daily. 06/13/24  Yes Vann, Jessica U, DO  Methoxy PEG-Epoetin  Beta (MIRCERA IJ) 200 mcg. During Dialysis, Every 2 weeks 06/18/24 06/17/25 Yes [provider]  midodrine  (PROAMATINE ) 5 MG tablet Take 5 mg by mouth 3 (three) times daily with meals.   Yes [provider]  Vitamin D ,  Ergocalciferol , (DRISDOL ) 1.25 MG (50000 UNIT) CAPS capsule Take 1 capsule (50,000 Units total) by mouth every Thursday. 07/07/22  Yes Angiulli, Toribio PARAS, PA-C    I have reviewed the patient's current and reported prior to admission medications.  Labs:     Latest Ref Rng & Units 07/15/2024    8:32 AM 06/13/2024    3:33 AM 06/12/2024    3:48 AM  BMP  Glucose 70 - 99 mg/dL 85  96  95   BUN 6 - 20 mg/dL 883  28  33   Creatinine 0.44 - 1.00 mg/dL 89.39  5.99  4.58   Sodium 135 - 145 mmol/L 145  138  140   Potassium 3.5 - 5.1 mmol/L 4.6  4.8  4.3   Chloride 98 - 111 mmol/L 109  100  103   CO2 22 - 32 mmol/L 13  24  27    Calcium  8.9 - 10.3 mg/dL 6.7  9.2  9.0     Urinalysis    Component Value Date/Time   COLORURINE YELLOW 05/11/2024 0854   APPEARANCEUR CLEAR 05/11/2024 0854   LABSPEC 1.010 05/11/2024 0854   PHURINE 8.0 05/11/2024 0854   GLUCOSEU 50 (A) 05/11/2024 0854   HGBUR NEGATIVE 05/11/2024 0854   BILIRUBINUR NEGATIVE 05/11/2024 0854   KETONESUR NEGATIVE 05/11/2024 0854   PROTEINUR 100 (A) 05/11/2024 0854   UROBILINOGEN 0.2 08/21/2012 2130   NITRITE NEGATIVE 05/11/2024 0854   LEUKOCYTESUR NEGATIVE 05/11/2024 0854     ROS:  Pertinent items noted in HPI and remainder of comprehensive ROS otherwise negative.  Physical Exam: Vitals:   07/15/24 1730 07/15/24 1800  BP: (!) 187/86 (!) 166/81  Pulse: (!) 115 (!) 110  Resp: 18 18  Temp: 98.3 F (36.8 C) 98.3 F (36.8 C)  SpO2:  100%     General: adult female in chair in NAD  HEENT: NCAT  Eyes: EOMI sclera anicteric Neck: supple trachea midline  Heart: S1S2 no rub; tachy Lungs: clear to auscultation; normal work of breathing; on room air  Abdomen: softly dist/obese habitus; nontener Extremities: bilateral lower extremities with 3+ edema; no cyanosis or clubbing  Skin: no rash on extremities exposed  Neuro: alert and oriented x 3 provides hx and follows commands  Psych: normal mood and affect Access LUE AVF with  bruit and thrill, bruising   Outpatient HD orders:  HD per TTS schedule  4 hrs  BF 500 DF auto 1.5 3k/2.5 ca EDW 75 kg 07/08/24 is last outpatient tx I can see with 85.8 kg post weight from HD Then treated 06/27/24 and 06/18/24 outpatient  Left AV graft  Meds: mircera 225 mcg every 2 weeks (which was last given on 12/2); iron 50 mg weekly; hectorol 9 mcg every treatment 3/week  Assessment/Plan:  # ESRD  - [facility] and [facility] per [facility] schedule - HD in AM after fistulogram with IR.  Then assess needs daily as anticipate will need serial HD   # Poorly functioning AVF  - infiltration last week and pt states poor HD for the past two weeks - NPO after midnight for fistulogram with IR early AM on 12/30  # Fluid volume overload  - optimize volume status with HD  - lasix  80 mg IV daily for now  - strict ins/outs and daily weights   # HTN  - listed as being on midodrine  however is hypertensive - stop midodrine  - agree  # Anemia of CKD - getting PRBC's now  - one unit and reassess    # Metabolic bone disease  - hyperphosphatemia - HD as above  - on phoslo   - hypocalcemia - IV calcium  once    She is listed as obs and is likely more appropriate as inpatient.    Katheryn JAYSON Saba 07/15/2024, 7:36 PM       [1] No Known Allergies

## 2024-07-15 NOTE — Assessment & Plan Note (Signed)
Vitamin D supplements.  

## 2024-07-15 NOTE — Assessment & Plan Note (Signed)
 Currently stable  On any medication currently.

## 2024-07-15 NOTE — Assessment & Plan Note (Addendum)
 Symptomatic anemia with a hemoglobin of 6.1 -complaining of shortness of breath, generalized weakness - anemia of chronic disease, end-stage renal disease -Hemoglobin 6.1 (baseline hemoglobin around 8.0) - Hemoccult negative   - Studies total iron 47, TIBC  low at 176, folate 5.5  -Transfusing 2U PRBC -with slow transfusion, check an H&H

## 2024-07-15 NOTE — Assessment & Plan Note (Signed)
 Body mass index is 32.92 kg/m. - Follow-up as an outpatient with PCP for weight loss

## 2024-07-16 ENCOUNTER — Inpatient Hospital Stay (HOSPITAL_COMMUNITY)

## 2024-07-16 DIAGNOSIS — K219 Gastro-esophageal reflux disease without esophagitis: Secondary | ICD-10-CM | POA: Diagnosis present

## 2024-07-16 DIAGNOSIS — R1310 Dysphagia, unspecified: Secondary | ICD-10-CM | POA: Diagnosis present

## 2024-07-16 DIAGNOSIS — G4733 Obstructive sleep apnea (adult) (pediatric): Secondary | ICD-10-CM | POA: Diagnosis present

## 2024-07-16 DIAGNOSIS — I132 Hypertensive heart and chronic kidney disease with heart failure and with stage 5 chronic kidney disease, or end stage renal disease: Secondary | ICD-10-CM | POA: Diagnosis present

## 2024-07-16 DIAGNOSIS — N840 Polyp of corpus uteri: Secondary | ICD-10-CM | POA: Diagnosis present

## 2024-07-16 DIAGNOSIS — E538 Deficiency of other specified B group vitamins: Secondary | ICD-10-CM | POA: Diagnosis present

## 2024-07-16 DIAGNOSIS — E1122 Type 2 diabetes mellitus with diabetic chronic kidney disease: Secondary | ICD-10-CM | POA: Diagnosis present

## 2024-07-16 DIAGNOSIS — T82510A Breakdown (mechanical) of surgically created arteriovenous fistula, initial encounter: Secondary | ICD-10-CM | POA: Diagnosis present

## 2024-07-16 DIAGNOSIS — D500 Iron deficiency anemia secondary to blood loss (chronic): Secondary | ICD-10-CM | POA: Diagnosis present

## 2024-07-16 DIAGNOSIS — D631 Anemia in chronic kidney disease: Secondary | ICD-10-CM | POA: Diagnosis present

## 2024-07-16 DIAGNOSIS — Y712 Prosthetic and other implants, materials and accessory cardiovascular devices associated with adverse incidents: Secondary | ICD-10-CM | POA: Diagnosis present

## 2024-07-16 DIAGNOSIS — I480 Paroxysmal atrial fibrillation: Secondary | ICD-10-CM | POA: Diagnosis present

## 2024-07-16 DIAGNOSIS — N2581 Secondary hyperparathyroidism of renal origin: Secondary | ICD-10-CM | POA: Diagnosis present

## 2024-07-16 DIAGNOSIS — I5033 Acute on chronic diastolic (congestive) heart failure: Secondary | ICD-10-CM | POA: Diagnosis present

## 2024-07-16 DIAGNOSIS — Z79899 Other long term (current) drug therapy: Secondary | ICD-10-CM | POA: Diagnosis not present

## 2024-07-16 DIAGNOSIS — I2489 Other forms of acute ischemic heart disease: Secondary | ICD-10-CM | POA: Diagnosis present

## 2024-07-16 DIAGNOSIS — T82898A Other specified complication of vascular prosthetic devices, implants and grafts, initial encounter: Secondary | ICD-10-CM | POA: Diagnosis present

## 2024-07-16 DIAGNOSIS — E872 Acidosis, unspecified: Secondary | ICD-10-CM | POA: Diagnosis present

## 2024-07-16 DIAGNOSIS — K760 Fatty (change of) liver, not elsewhere classified: Secondary | ICD-10-CM | POA: Diagnosis present

## 2024-07-16 DIAGNOSIS — N186 End stage renal disease: Secondary | ICD-10-CM | POA: Diagnosis present

## 2024-07-16 DIAGNOSIS — E1142 Type 2 diabetes mellitus with diabetic polyneuropathy: Secondary | ICD-10-CM | POA: Diagnosis present

## 2024-07-16 DIAGNOSIS — E66813 Obesity, class 3: Secondary | ICD-10-CM | POA: Diagnosis present

## 2024-07-16 DIAGNOSIS — E559 Vitamin D deficiency, unspecified: Secondary | ICD-10-CM | POA: Diagnosis present

## 2024-07-16 DIAGNOSIS — Z992 Dependence on renal dialysis: Secondary | ICD-10-CM | POA: Diagnosis not present

## 2024-07-16 HISTORY — PX: IR DIALY SHUNT INTRO NEEDLE/INTRACATH INITIAL W/IMG LEFT: IMG6102

## 2024-07-16 LAB — CBC
HCT: 22.7 % — ABNORMAL LOW (ref 36.0–46.0)
Hemoglobin: 7.4 g/dL — ABNORMAL LOW (ref 12.0–15.0)
MCH: 29.1 pg (ref 26.0–34.0)
MCHC: 32.6 g/dL (ref 30.0–36.0)
MCV: 89.4 fL (ref 80.0–100.0)
Platelets: 217 K/uL (ref 150–400)
RBC: 2.54 MIL/uL — ABNORMAL LOW (ref 3.87–5.11)
RDW: 14.9 % (ref 11.5–15.5)
WBC: 9.8 K/uL (ref 4.0–10.5)
nRBC: 0 % (ref 0.0–0.2)

## 2024-07-16 LAB — BASIC METABOLIC PANEL WITH GFR
Anion gap: 20 — ABNORMAL HIGH (ref 5–15)
BUN: 111 mg/dL — ABNORMAL HIGH (ref 6–20)
CO2: 11 mmol/L — ABNORMAL LOW (ref 22–32)
Calcium: 6.9 mg/dL — ABNORMAL LOW (ref 8.9–10.3)
Chloride: 112 mmol/L — ABNORMAL HIGH (ref 98–111)
Creatinine, Ser: 10.8 mg/dL — ABNORMAL HIGH (ref 0.44–1.00)
GFR, Estimated: 4 mL/min — ABNORMAL LOW
Glucose, Bld: 93 mg/dL (ref 70–99)
Potassium: 4.8 mmol/L (ref 3.5–5.1)
Sodium: 144 mmol/L (ref 135–145)

## 2024-07-16 LAB — HEPATITIS B SURFACE ANTIGEN: Hepatitis B Surface Ag: NONREACTIVE

## 2024-07-16 LAB — HEPATIC FUNCTION PANEL
ALT: 80 U/L — ABNORMAL HIGH (ref 0–44)
AST: 57 U/L — ABNORMAL HIGH (ref 15–41)
Albumin: 3.2 g/dL — ABNORMAL LOW (ref 3.5–5.0)
Alkaline Phosphatase: 67 U/L (ref 38–126)
Bilirubin, Direct: 0.2 mg/dL (ref 0.0–0.2)
Indirect Bilirubin: 0.4 mg/dL (ref 0.3–0.9)
Total Bilirubin: 0.5 mg/dL (ref 0.0–1.2)
Total Protein: 6.4 g/dL — ABNORMAL LOW (ref 6.5–8.1)

## 2024-07-16 LAB — APTT: aPTT: 30 s (ref 24–36)

## 2024-07-16 LAB — GLUCOSE, CAPILLARY: Glucose-Capillary: 75 mg/dL (ref 70–99)

## 2024-07-16 MED ORDER — MIDAZOLAM HCL 2 MG/2ML IJ SOLN
INTRAMUSCULAR | Status: AC
  Filled 2024-07-16: qty 2

## 2024-07-16 MED ORDER — CALCIUM GLUCONATE-NACL 1-0.675 GM/50ML-% IV SOLN
1.0000 g | Freq: Once | INTRAVENOUS | Status: AC
  Administered 2024-07-16: 1000 mg via INTRAVENOUS
  Filled 2024-07-16: qty 50

## 2024-07-16 MED ORDER — MIDAZOLAM HCL (PF) 2 MG/2ML IJ SOLN
INTRAMUSCULAR | Status: AC | PRN
  Administered 2024-07-16: 1 mg via INTRAVENOUS
  Administered 2024-07-16 (×2): .5 mg via INTRAVENOUS

## 2024-07-16 MED ORDER — FENTANYL CITRATE (PF) 100 MCG/2ML IJ SOLN
INTRAMUSCULAR | Status: AC | PRN
  Administered 2024-07-16 (×3): 25 ug via INTRAVENOUS

## 2024-07-16 MED ORDER — FOLIC ACID 1 MG PO TABS
1.0000 mg | ORAL_TABLET | Freq: Every day | ORAL | Status: DC
  Administered 2024-07-16 – 2024-07-22 (×7): 1 mg via ORAL
  Filled 2024-07-16 (×7): qty 1

## 2024-07-16 MED ORDER — FUROSEMIDE 10 MG/ML IJ SOLN
80.0000 mg | Freq: Every day | INTRAMUSCULAR | Status: DC
  Administered 2024-07-16 – 2024-07-22 (×7): 80 mg via INTRAVENOUS
  Filled 2024-07-16 (×7): qty 8

## 2024-07-16 MED ORDER — CHLORHEXIDINE GLUCONATE CLOTH 2 % EX PADS
6.0000 | MEDICATED_PAD | Freq: Every day | CUTANEOUS | Status: DC
  Administered 2024-07-17: 6 via TOPICAL

## 2024-07-16 MED ORDER — DARBEPOETIN ALFA 150 MCG/0.3ML IJ SOSY
150.0000 ug | PREFILLED_SYRINGE | INTRAMUSCULAR | Status: DC
  Administered 2024-07-16: 150 ug via SUBCUTANEOUS
  Filled 2024-07-16: qty 0.3

## 2024-07-16 MED ORDER — FENTANYL CITRATE (PF) 100 MCG/2ML IJ SOLN
INTRAMUSCULAR | Status: AC
  Filled 2024-07-16: qty 2

## 2024-07-16 MED ORDER — LIDOCAINE HCL 1 % IJ SOLN
INTRAMUSCULAR | Status: AC
  Filled 2024-07-16: qty 20

## 2024-07-16 MED ORDER — IOHEXOL 300 MG/ML  SOLN
100.0000 mL | Freq: Once | INTRAMUSCULAR | Status: AC | PRN
  Administered 2024-07-16: 60 mL via INTRAVENOUS

## 2024-07-16 NOTE — Progress Notes (Signed)
 [facility] Progress Note  Name: Jacqueline [name] MRN: [id number] DOB: 06/09/1981  Chief Complaint:  Leg swelling   Subjective:  She had no uop charted over 12/29 - had been given lasix  IV.  Spoke with IR this AM and again recently.  She had 300 mL UOP today at some point - in her urine hat currently.  She states that she did urinate a few times yesterday.  She states that whenever I go to the clinic they have difficulty with accessing her graft.  She reports she had been advised to present to the ER last week and came on Monday this week.   Review of systems:  She reports shortness of breath - improved from prior a bit, though  Denies chest pain Denies n/v Has been NPO   ------------------------ Background on consult:  Jacqueline [name] is a 43 y.o. female with a history of ESRD on HD, HTN, diabetes mellitus and morbid obesity who presented to the ER at [facility].  She reported not getting good or much dialysis for 2 weeks due to the access not functioning well.  She states last Tuesday she had HD for less than an hour and then had an infiltration.  Then she didn't have HD again outpatient and her outpatient unit directed her to the ER.  She states last full HD treatment was likely 2 weeks ago.  She has gained fluid weight - reportedly 15 lbs.  PRBC's were ordered - not able to be transfused at [facility].  She has been short of breath and on room air.  She has arrived at [facility].  She has been ordered 2 units of PRBC's and is currently getting one unit.  She got lasix  40 mg IV once.       Intake/Output Summary (Last 24 hours) at 07/16/2024 1234 Last data filed at 07/16/2024 0451 Gross per 24 hour  Intake 555 ml  Output 0 ml  Net 555 ml    Vitals:  Vitals:   07/16/24 0414 07/16/24 0500 07/16/24 0840 07/16/24 0852  BP: (!) 165/85  (!) 145/81 (!) 145/81  Pulse: (!) 102  (!) 101   Resp: 18     Temp: 98.7 F (37.1 C)     TempSrc:   Oral   SpO2: 97%   99%   Weight:  86.6 kg       Physical Exam:  General adult female in bed in no acute distress HEENT normocephalic atraumatic extraocular movements intact sclera anicteric Neck supple trachea midline Lungs clear to auscultation bilaterally normal work of breathing at rest on room air  Heart S1S2 no rub Abdomen soft nontender nondistended Extremities 3+ edema bilateral lower extremities Psych normal mood and affect Neuro alert and conversant; provides hx and follows commands  Access LUE AV graft with bruit, bruising nearby  Medications reviewed   Labs:     Latest Ref Rng & Units 07/16/2024    3:38 AM 07/15/2024    8:32 AM 06/13/2024    3:33 AM  BMP  Glucose 70 - 99 mg/dL 93  85  96   BUN 6 - 20 mg/dL 888  883  28   Creatinine 0.44 - 1.00 mg/dL 89.19  89.39  5.99   Sodium 135 - 145 mmol/L 144  145  138   Potassium 3.5 - 5.1 mmol/L 4.8  4.6  4.8   Chloride 98 - 111 mmol/L 112  109  100   CO2 22 - 32  mmol/L 11  13  24    Calcium  8.9 - 10.3 mg/dL 6.9  6.7  9.2    Outpatient HD orders:  [facility] schedule  4 hrs  BF 500 DF auto 1.5 3k/2.5 ca EDW 75 kg 07/08/24 is last outpatient tx I can see with 85.8 kg post weight from HD Then treated 06/27/24 and 06/18/24 outpatient  Left AV graft  Meds: mircera 225 mcg every 2 weeks (which was last given on 12/2); iron 50 mg weekly; hectorol 9 mcg every treatment 3/wee   Assessment/Plan:   # ESRD  - on [facility] and usually per TTS schedule - HD today after fistulogram with IR.  Then assess needs daily as anticipate will need serial HD  - If no f'gram today then attempt HD overnight    # Poorly functioning AVF  - infiltration last week and pt states poor HD for the past two weeks - NPO for fistulogram today with IR    # Fluid volume overload  - optimize volume status with HD  - restarted lasix  80 mg IV once daily - she was urinating, it just wasn't being recorded - strict ins/outs and daily weights - reinforced with pt and  nursing    # HTN  - listed as being on midodrine  however is hypertensive - stop midodrine  - agree   # Anemia of CKD - s/p PRBC's x 1 unit  - ordered aranesp  150 mcg every week for now on Tuesdays - PRBC's per primary team discretion        # Metabolic acidosis - HD today  - seems to be noncompliant    # Metabolic bone disease  - hyperphosphatemia - HD as above  - on phoslo   - hypocalcemia - IV calcium  once and on calcitriol    She is listed as obs and is more appropriate as inpatient.  She doesn't have functional dialysis access and she is markedly overloaded - will need serial HD  Katheryn JAYSON Saba, MD 07/16/2024 1:11 PM

## 2024-07-16 NOTE — Plan of Care (Signed)
  Problem: Pain Managment: Goal: General experience of comfort will improve and/or be controlled Outcome: Progressing   Problem: Safety: Goal: Ability to remain free from injury will improve Outcome: Progressing   Problem: Skin Integrity: Goal: Risk for impaired skin integrity will decrease Outcome: Progressing

## 2024-07-16 NOTE — Procedures (Addendum)
 Interventional Radiology Procedure:   Indications: Left arm AV graft is not functioning well.  History of prior balloon angioplasty and stent placement  Procedure: Left upper extremity shuntogram  Findings: Left upper arm graft is widely patent, including the stent at venous anastomosis.   Complications: None     EBL: Minimal  Plan:  Left arm AV graft is ready to use.    Rasa Degrazia R. Philip, MD  Pager: [phone]

## 2024-07-16 NOTE — Progress Notes (Signed)
 " PROGRESS NOTE    Jacqueline [name]  FMW:[id number] DOB: Jul 20, 1980 DOA: 07/15/2024 PCP: [name], MD  Subjective: No acute events overnight. Seen and examined at bedside. Reports feeling slightly better with improvement in shortness of breath. Reports still with significant asymmetric bilateral LE edema, R > L. Patient states she has chronically more edema in R leg due to history of extensive surgery there in the past.   Hospital Course: Jacqueline [name] is a 43 year old female with extensive history of HTN, DM II, Hypotension, OSA vitamin D  deficiency, Anemia of chronic disease, morbid obesity, ESRD on hemodialysis. Was not be able to complete dialysis x 2 weeks due to issue with left upper arm.  Patient believes she has gained approximately 15 pounds.  Complaining of shortness of breath, denies any chest pain.  Denies any recent illness discharge or fever, chills, nausea vomiting constipation or diarrhea. Denies of having any further vaginal bleeding since hormonal therapy last month.  Contacted dialysis center who told her to report to the ED.  ED Evaluation:  Blood pressure (!) 162/83, pulse (!) 111, temperature 98.3 F (36.8 C), resp. rate 16, weight 88.8 kg, last menstrual period 06/15/2024, SpO2 100% on RA  LABs: CO2 13 BUN 116 creatinine 10.60, calcium  6.7, AST 110, ALT 100, BNP 10,336.0, troponin 118, WBC 9.2, hemoglobin 6.1  Hemoglobin negative. Patient reports vaginal bleeding has been well-controlled since she started hormone therapy last month.    EDP Dr. Evalyn discussed with Dr. Jerrye with nephrology who will arrange for dialysis.     Assessment and Plan:  AV fistula dysfunction ESRD (end stage renal disease) (HCC) -Volume overload state  - Missed hemodialysis x 2 weeks due to AV fistula infiltration - s/p lasix  80mg  IV once on admission - plan for IR fistulogram - will need aggressive serial HD thereafter spanning several days - nephrology  following  Symptomatic anemia Folate deficiency Anemia of chronic disease Hemoglobin 7.4 < 6.1, baseline 7-8 - iron studies without iron deficiency - status post blood transfusion 2 units on admission - No signs of bleeding, Hemoccult negative - start oral folate - monitor CBC  Acute on chronic diastolic CHF (congestive heart failure) (HCC) Clinically fluid overloaded due to missed HD sessions Echo 09/2023, EF 60-65%, normal LV function, normal RV function, valves within normal limits, - s/p lasix  80mg  IV once on admission - UF with hemodialysis as per nephro recs - monitor I/Os -Nephrologist following  History of hypotension Currently patient is hypertensive -Discontinue home medication of midodrine  -As needed IV hydralazine  -Resuming patient home medication of Cardizem , Lasix , Anticipating additional meds for hypertension before discharge  Hypocalcemia due to chronic kidney disease Chronic hypocalcemia, due to end-stage renal disease Serum calcium  6.7, obtaining ionized calcium , (albumin  normal at 3.7)  Diabetic peripheral neuropathy (HCC) Currently stable  On any medication currently.  OSA (obstructive sleep apnea) Continue O2 as needed, CPAP nightly as needed  Elevated troponin Chronically elevated at 118 likely due to demand ischemia, volume overload CHF -Troponin 118 -Denies any chest pain - EKG with no ischemic changes - monitor clinically  PAF (paroxysmal atrial fibrillation) (HCC) Rate controlled with Cardizem  CD120 mg daily, No chronic anticoagulation listed Confirmed with pharmacy and patient (likely due to persistent history of severe anemia -no active bleeding site identified)  Vitamin D  deficiency Vitamin D  supplements  Steatosis of liver Monitoring LFTs likely worsened with congestion, Albumin  3.7, AST 110, ALT 100 alk phos 75, T. bili 0.5 -Avoid hepatotoxic -including statins -Avoiding statins for now  Obesity, Class III, BMI 40-49.9 (morbid  obesity) (HCC) Body mass index is 32.92 kg/m. - Follow-up as an outpatient with PCP for weight loss  DVT prophylaxis: Place and maintain sequential compression device Start: 07/15/24 1022 Place TED hose Start: 07/15/24 1013 SCDs Start: 07/15/24 1012  SCDs   Code Status: Full Code  Disposition Plan: Home Reason for continuing need for hospitalization: AV fistulogram pending, needs urgent hemodialysis, nephrology recommendations  Objective: Vitals:   07/16/24 0414 07/16/24 0500 07/16/24 0840 07/16/24 0852  BP: (!) 165/85  (!) 145/81 (!) 145/81  Pulse: (!) 102  (!) 101   Resp: 18     Temp: 98.7 F (37.1 C)     TempSrc:   Oral   SpO2: 97%  99%   Weight:  86.6 kg      Intake/Output Summary (Last 24 hours) at 07/16/2024 1246 Last data filed at 07/16/2024 0451 Gross per 24 hour  Intake 555 ml  Output 0 ml  Net 555 ml   Filed Weights   07/15/24 0811 07/15/24 1436 07/16/24 0500  Weight: 88.8 kg 87 kg 86.6 kg    Examination:  Physical Exam Vitals and nursing note reviewed.  Constitutional:      General: She is not in acute distress.    Appearance: She is ill-appearing.  HENT:     Head: Normocephalic and atraumatic.  Cardiovascular:     Rate and Rhythm: Normal rate and regular rhythm.     Pulses: Normal pulses.     Heart sounds: Normal heart sounds.  Pulmonary:     Effort: Pulmonary effort is normal.     Breath sounds: Normal breath sounds.  Abdominal:     General: Bowel sounds are normal.     Palpations: Abdomen is soft.  Neurological:     Mental Status: She is alert. Mental status is at baseline.     Data Reviewed: I have personally reviewed following labs and imaging studies  CBC: Recent Labs  Lab 07/15/24 0832 07/16/24 0338  WBC 9.2 9.8  NEUTROABS 6.3  --   HGB 6.1* 7.4*  HCT 19.1* 22.7*  MCV 88.8 89.4  PLT 202 217   Basic Metabolic Panel: Recent Labs  Lab 07/15/24 0832 07/15/24 1012 07/16/24 0338  NA 145  --  144  K 4.6  --  4.8  CL 109   --  112*  CO2 13*  --  11*  GLUCOSE 85  --  93  BUN 116*  --  111*  CREATININE 10.60*  --  10.80*  CALCIUM  6.7*  --  6.9*  MG  --  2.7*  --   PHOS  --  8.4*  --    GFR: Estimated Creatinine Clearance: 7.1 mL/min (A) (by C-G formula based on SCr of 10.8 mg/dL (H)). Liver Function Tests: Recent Labs  Lab 07/15/24 0832  AST 110*  ALT 100*  ALKPHOS 75  BILITOT 0.5  PROT 6.8  ALBUMIN  3.7   No results for input(s): LIPASE, AMYLASE in the last 168 hours. No results for input(s): AMMONIA in the last 168 hours. Coagulation Profile: Recent Labs  Lab 07/15/24 1012  INR 1.3*   Cardiac Enzymes: No results for input(s): CKTOTAL, CKMB, CKMBINDEX, TROPONINI in the last 168 hours. ProBNP, BNP (last 5 results) Recent Labs    10/11/23 0920 06/10/24 0921 07/15/24 0832  PROBNP  --  4,761.0* 10,336.0*  BNP 103.5*  --   --    HbA1C: No results for input(s): HGBA1C in the last 72  hours. CBG: Recent Labs  Lab 07/16/24 0838  GLUCAP 75   Lipid Profile: No results for input(s): CHOL, HDL, LDLCALC, TRIG, CHOLHDL, LDLDIRECT in the last 72 hours. Thyroid  Function Tests: Recent Labs    07/15/24 1021  TSH 3.810   Anemia Panel: Recent Labs    07/15/24 1020 07/15/24 1021  VITAMINB12 1,473*  --   FOLATE  --  5.5*  TIBC 176*  --   IRON 47  --    Sepsis Labs: No results for input(s): PROCALCITON, LATICACIDVEN in the last 168 hours.  No results found for this or any previous visit (from the past 240 hours).   Radiology Studies: DG Chest Portable 1 View Result Date: 07/15/2024 EXAM: 1 VIEW(S) XRAY OF THE CHEST 07/15/2024 08:23:00 AM COMPARISON: 06/10/2022 CLINICAL HISTORY: cp FINDINGS: LUNGS AND PLEURA: Low lung volume. Prominent pulmonary vasculature. No focal pulmonary opacity. No pleural effusion. No pneumothorax. HEART AND MEDIASTINUM: Mild cardiomegaly. BONES AND SOFT TISSUES: Chronic fracture deformity of left posterior sixth and seventh ribs.  IMPRESSION: 1. No acute cardiopulmonary abnormality. 2. Mild cardiomegaly with prominent pulmonary vasculature. Electronically signed by: Waddell Calk MD 07/15/2024 08:32 AM EST RP Workstation: [id number]    Scheduled Meds:  sodium chloride    Intravenous Once   calcitRIOL   0.5 mcg Oral Daily   calcium  acetate  1,334 mg Oral TID WC   Chlorhexidine  Gluconate Cloth  6 each Topical Q0600   diltiazem   120 mg Oral q morning   senna-docusate  1 tablet Oral QHS   sodium chloride  flush  3 mL Intravenous Q12H   Continuous Infusions:   LOS: 0 days   Norval Bar, MD  [facility]  07/16/2024, 12:46 PM   "

## 2024-07-16 NOTE — TOC CM/SW Note (Signed)
 Transition of Care [facility]) - Inpatient Brief Assessment   Patient Details  Name: Jacqueline [name] MRN: [id number] Date of Birth: Sep 06, 1980  Transition of Care [facility]) CM/SW Contact:    Tom-Johnson, Harvest Muskrat, RN Phone Number: 07/16/2024, 5:01 PM   Clinical Narrative:  Patient presented to the ED with worsening Shortness Of Breath, LUA HD Cath Malfunction and Weight Gain. Has hx of HTN, T2DM, Hypotension, OSA Vitamin D  Deficiency, Anemia, Morbid Obesity, ESRD on TTS outpatient HD schedule. IR consulted for Fistulogram with possible intervention, possible replacement of Tunneled v/s Temporary Dialysis Catheter.   CM spoke with patient at bedside about needs for post hospital transition. Patient lives with her husband and two children. Both parent and three siblings supportive. Not employed, on disability. Independent with care and drive self to and from outpatient HD. Does not have DME's at home.  PCP is [name], MD and uses [facility] on [address] in [place].  No ICM needs or recommendations noted at this time.  Patient not Medically ready for discharge.  CM will continue to follow as patient progresses with care towards discharge.                        Transition of Care Asessment: Insurance and Status: Insurance coverage has been reviewed Patient has primary care physician: Yes Home environment has been reviewed: Yes Prior level of function:: Independent Prior/Current Home Services: No current home services Social Drivers of Health Review: SDOH reviewed no interventions necessary Readmission risk has been reviewed: Yes Transition of care needs: no transition of care needs at this time

## 2024-07-16 NOTE — Plan of Care (Signed)

## 2024-07-16 NOTE — Progress Notes (Signed)
 Pt receives out-pt HD at [facility] on TTS 5:35 am chair time. Will assist as needed.   Randine Mungo Dialysis Navigator [phone]

## 2024-07-16 NOTE — Consult Note (Signed)
 "   Chief Complaint: malfunctioning dialysis catheter for access for dialysis treatment  Referring Provider(s): Jerrye Mar, MD  Supervising Physician: Philip Cornet  Patient Status: [facility] - In-pt  History of Present Illness: Jacqueline [name] is a 43 y.o. female with medical history significant for CKD, T2DM, HTN, and OSA not on CPAP. She has not had a full dialysis treatment in 2 weeks. Reports a gain of approximately 15 pounds and more edema in her right leg compared to her left leg. Patient states she went to dialysis 2 weeks ago and they could not gain access despite multiple sticks. When she went to dialysis last Tuesday she said treatment only ran for about 10 minutes when her left upper arm access infiltrated and her arm bruised and got swollen. Received request for image guided fistulogram with possible intervention, possible replacement of tunneled versus temporary dialysis catheter.   Confirms NPO since midnight. Denies fever, chills, chest pain, sore throat, nausea, vomiting, abdominal pain, blood in stool or urine, back pain.  Reports bruising to left upper arm, shortness of breath, and edema to bilateral legs.  Allergies Reviewed:  Patient has no known allergies.   Patient is Full Code  Past Medical History:  Diagnosis Date   Anemia    Anxiety    Chronic bronchitis (HCC)    Chronic kidney disease    Diabetes (HCC) 01/01/2014   Hypertension    Increased frequency of headaches    Morbid obesity (HCC)    Necrotizing fasciitis (HCC)    Sleep apnea    does not use CPAP - - gastric bypass   Type II diabetes mellitus (HCC)    no meds, diet controlled    Past Surgical History:  Procedure Laterality Date   A/V FISTULAGRAM N/A 06/12/2024   Procedure: A/V Fistulagram;  Surgeon: Gretta Lonni PARAS, MD;  [facility]: [facility] INVASIVE CV LAB;  Service: Cardiovascular;  Laterality: N/A;   A/V SHUNT INTERVENTION N/A 11/17/2023   Procedure: A/V SHUNT INTERVENTION;  Surgeon: Tobie Gordy POUR, MD;   Location: [facility] INVASIVE CV LAB;  Service: Cardiovascular;  Laterality: N/A;  80% venous anastomosis   A/V SHUNT INTERVENTION Left 05/10/2024   Procedure: A/V SHUNT INTERVENTION;  Surgeon: Serene Gaile ORN, MD;  Location: [facility] PV LAB;  Service: Cardiovascular;  Laterality: Left;   AV FISTULA PLACEMENT Left 05/02/2023   Procedure: LEFT ARM ARTERIOVENOUS (AV) FISTULA  GRAFT;  Surgeon: Pearline Norman RAMAN, MD;  Location[facility] OR;  Service: Vascular;  Laterality: Left;   BARIATRIC SURGERY     BREAST REDUCTION SURGERY  03/21/2017   CARDIAC CATHETERIZATION  01/06/2016   CARDIAC CATHETERIZATION N/A 01/06/2016   Procedure: Left Heart Cath and Coronary Angiography;  Surgeon: Deatrice DELENA Cage, MD;  Location: [facility] INVASIVE CV LAB;  Service: Cardiovascular;  Laterality: N/A;   CESAREAN SECTION  08/2014   I & D EXTREMITY Right 06/18/2022   Procedure: IRRIGATION AND DEBRIDEMENT RIGHT ABDOMEN AND THIGH;  Surgeon: Kinsinger, Herlene Righter, MD;  Location: [facility] OR;  Service: General;  Laterality: Right;   I & D EXTREMITY Right 06/20/2022   Procedure: WOUND EXPLORATION OF RIGHT THIGH AND RIGHT GROIN WITH IRRIGATION AND DEBRIDEMENT;  Surgeon: Curvin Deward MOULD, MD;  Location: [facility] OR;  Service: General;  Laterality: Right;   IR AV DIALY SHUNT INTRO NEEDLE/INTRACATH INITIAL W/PTA/IMG LEFT  10/19/2023   IR AV DIALY SHUNT INTRO NEEDLE/INTRACATH INITIAL W/PTA/IMG LEFT  02/13/2024   IR DIALY SHUNT INTRO NEEDLE/INTRACATH INITIAL W/IMG LEFT Left 11/29/2023   IR FLUORO GUIDE  CV LINE RIGHT  02/10/2024   IR FLUORO GUIDE CV LINE RIGHT  02/15/2024   IR US  GUIDE VASC ACCESS LEFT  02/13/2024   IR US  GUIDE VASC ACCESS RIGHT  02/10/2024   REDUCTION MAMMAPLASTY Bilateral 03/21/2017   TRANSESOPHAGEAL ECHOCARDIOGRAM (CATH LAB) N/A 10/16/2023   Procedure: TRANSESOPHAGEAL ECHOCARDIOGRAM;  Surgeon: Barbaraann Darryle Ned, MD;  Location: [facility] INVASIVE CV LAB;  Service: Cardiovascular;  Laterality: N/A;   UPPER EXTREMITY INTERVENTION  06/12/2024   Procedure: UPPER  EXTREMITY INTERVENTION;  Surgeon: Gretta Lonni PARAS, MD;  Location: [facility] INVASIVE CV LAB;  Service: Cardiovascular;;   VENOUS ANGIOPLASTY Left 05/10/2024   Procedure: VENOUS ANGIOPLASTY;  Surgeon: Serene Gaile ORN, MD;  Location: [facility] PV LAB;  Service: Cardiovascular;  Laterality: Left;  Venous Anastomosis   VENOUS STENT Left 05/10/2024   Procedure: VENOUS STENT;  Surgeon: Serene Gaile ORN, MD;  Location: [facility] PV LAB;  Service: Cardiovascular;  Laterality: Left;  8x10 Viabahn      Medications: Prior to Admission medications  Medication Sig Start Date End Date Taking? Authorizing Provider  acetaminophen  (TYLENOL ) 500 MG tablet Take 1,000 mg by mouth 2 (two) times daily as needed for moderate pain (pain score 4-6), fever or headache.   Yes [provider]  calcitRIOL  (ROCALTROL ) 0.5 MCG capsule Take 0.5 mcg by mouth daily. Prior to dialysis on Tuesday, Thursday, Saturdays only 10/03/23  Yes [provider]  calcium  acetate (PHOSLO ) 667 MG capsule Take 1,334 mg by mouth 3 (three) times daily with meals.   Yes [provider]  diltiazem  (CARDIZEM  CD) 120 MG 24 hr capsule Take 1 capsule (120 mg total) by mouth every morning. 02/16/24  Yes Trixie Nilda HERO, MD  furosemide  (LASIX ) 40 MG tablet Take 80 mg by mouth daily. 11/15/23  Yes [provider]  megestrol  (MEGACE ) 40 MG tablet Take 1 tablet (40 mg total) by mouth 3 (three) times daily. 06/13/24  Yes Vann, Jessica U, DO  Methoxy PEG-Epoetin  Beta (MIRCERA IJ) 200 mcg. During Dialysis, Every 2 weeks 06/18/24 06/17/25 Yes [provider]  midodrine  (PROAMATINE ) 5 MG tablet Take 5 mg by mouth 3 (three) times daily with meals.   Yes [provider]  Vitamin D , Ergocalciferol , (DRISDOL ) 1.25 MG (50000 UNIT) CAPS capsule Take 1 capsule (50,000 Units total) by mouth every Thursday. 07/07/22  Yes Angiulli, Toribio PARAS, PA-C     Family History  Problem Relation Age of Onset   Diabetes Mother    Hypertension  Mother    Thyroid  disease Mother    Kidney disease Maternal Grandmother    Diabetes Maternal Grandmother    Heart attack Other     Social History   Socioeconomic History   Marital status: Married    Spouse name: Not on file   Number of children: Not on file   Years of education: Not on file   Highest education level: Not on file  Occupational History   Not on file  Tobacco Use   Smoking status: Never   Smokeless tobacco: Never  Vaping Use   Vaping status: Never Used  Substance and Sexual Activity   Alcohol use: No   Drug use: No   Sexual activity: Yes    Birth control/protection: None  Other Topics Concern   Not on file  Social History Narrative   Not on file   Social Drivers of Health   Tobacco Use: Low Risk (07/15/2024)   Patient History    Smoking Tobacco Use: Never    Smokeless  Tobacco Use: Never    Passive Exposure: Not on file  Financial Resource Strain: Not on file  Food Insecurity: No Food Insecurity (07/15/2024)   Epic    Worried About Programme Researcher, Broadcasting/film/video in the Last Year: Never true    Ran Out of Food in the Last Year: Never true  Transportation Needs: No Transportation Needs (07/15/2024)   Epic    Lack of Transportation (Medical): No    Lack of Transportation (Non-Medical): No  Physical Activity: Not on file  Stress: Not on file  Social Connections: Socially Integrated (10/11/2023)   Social Connection and Isolation Panel    Frequency of Communication with Friends and Family: More than three times a week    Frequency of Social Gatherings with Friends and Family: More than three times a week    Attends Religious Services: 1 to 4 times per year    Active Member of [employer] or Organizations: Yes    Attends Banker Meetings: More than 4 times per year    Marital Status: Married  Depression (EYV7-0): Not on file  Alcohol Screen: Not on file  Housing: Low Risk (07/15/2024)   Epic    Unable to Pay for Housing in the Last Year: No    Number of  Times Moved in the Last Year: 1    Homeless in the Last Year: No  Utilities: Not At Risk (07/15/2024)   Epic    Threatened with loss of utilities: No  Health Literacy: Not on file     Review of Systems: A 12 point ROS discussed and pertinent positives are indicated in the HPI above.  All other systems are negative.    Vital Signs: BP (!) 153/75   Pulse (!) 105   Temp 98.7 F (37.1 C)   Resp 18   Ht 5' 2 (1.575 m)   Wt 190 lb 14.4 oz (86.6 kg) Comment: standing  LMP 06/15/2024 (Approximate)   SpO2 98%   BMI 34.92 kg/m     Physical Exam HENT:     Mouth/Throat:     Mouth: Mucous membranes are moist.  Cardiovascular:     Rate and Rhythm: Regular rhythm. Tachycardia present.  Pulmonary:     Effort: Pulmonary effort is normal. No respiratory distress.     Breath sounds: Normal breath sounds.  Abdominal:     General: Bowel sounds are normal.     Palpations: Abdomen is soft.     Tenderness: There is no abdominal tenderness.  Skin:    Findings: Bruising present.     Comments: Bruising to left upper arm  Neurological:     Mental Status: She is alert.   HD access, LUE: +thrill (distally), +bruit auscultated, edematous   Imaging: DG Chest Portable 1 View Result Date: 07/15/2024 EXAM: 1 VIEW(S) XRAY OF THE CHEST 07/15/2024 08:23:00 AM COMPARISON: 06/10/2022 CLINICAL HISTORY: cp FINDINGS: LUNGS AND PLEURA: Low lung volume. Prominent pulmonary vasculature. No focal pulmonary opacity. No pleural effusion. No pneumothorax. HEART AND MEDIASTINUM: Mild cardiomegaly. BONES AND SOFT TISSUES: Chronic fracture deformity of left posterior sixth and seventh ribs. IMPRESSION: 1. No acute cardiopulmonary abnormality. 2. Mild cardiomegaly with prominent pulmonary vasculature. Electronically signed by: Waddell Calk MD 07/15/2024 08:32 AM EST RP Workstation: [id number]    Labs:  CBC: Recent Labs    06/11/24 0232 06/12/24 0348 07/15/24 0832 07/16/24 0338  WBC 8.3 7.7 9.2 9.8  HGB  8.1* 8.5* 6.1* 7.4*  HCT 23.6* 26.0* 19.1* 22.7*  PLT 221 226 202  217    COAGS: Recent Labs    07/15/24 1012 07/16/24 0338  INR 1.3*  --   APTT  --  30    BMP: Recent Labs    06/12/24 0348 06/13/24 0333 07/15/24 0832 07/16/24 0338  NA 140 138 145 144  K 4.3 4.8 4.6 4.8  CL 103 100 109 112*  CO2 27 24 13* 11*  GLUCOSE 95 96 85 93  BUN 33* 28* 116* 111*  CALCIUM  9.0 9.2 6.7* 6.9*  CREATININE 5.41* 4.00* 10.60* 10.80*  GFRNONAA 9* 13* 4* 4*    LIVER FUNCTION TESTS: Recent Labs    05/11/24 0655 06/10/24 0921 06/12/24 0348 06/13/24 0333 07/15/24 0832 07/16/24 0338  BILITOT 0.8 0.5  --   --  0.5 0.5  AST 27 22  --   --  110* 57*  ALT 22 17  --   --  100* 80*  ALKPHOS 65 53  --   --  75 67  PROT 7.5 6.6  --   --  6.8 6.4*  ALBUMIN  3.6 3.8 2.6* 3.1* 3.7 3.2*    TUMOR MARKERS: No results for input(s): AFPTM, CEA, CA199, CHROMGRNA in the last 8760 hours.  Assessment and Plan: Malfunctioning dialysis catheter for access for dialysis treatment-   Request for image guided fistulogram with possible intervention, possible replacement of tunneled versus temporary dialysis catheter. No contraindications for procedure identified in ROS, physical exam, or review of pre-sedation considerations.  Labs reviewed and within acceptable range Imaging available and reviewed VSS, afebrile   Risks and benefits discussed with the patient including, but not limited to bleeding, infection, vascular injury, pneumothorax which may require chest tube placement, air embolism or even death  All of the patient's questions were answered, patient is agreeable to proceed. Consent signed and in chart.   Thank you for allowing our service to participate in [facility] 's care.    Electronically Signed: Minal Stuller B [place] Mcphee, NP   07/16/2024, 4:05 PM     I spent a total of 40 Minutes in face to face in clinical consultation, greater than 50% of which was  counseling/coordinating care for image guided fistulogram with possible intervention, possible replacement of tunneled versus temporary dialysis catheter.   (A copy of this note was sent to the referring provider and the time of visit.)  "

## 2024-07-17 ENCOUNTER — Encounter (HOSPITAL_COMMUNITY): Payer: Self-pay

## 2024-07-17 DIAGNOSIS — N186 End stage renal disease: Secondary | ICD-10-CM | POA: Diagnosis not present

## 2024-07-17 HISTORY — PX: IR US GUIDE VASC ACCESS LEFT: IMG2389

## 2024-07-17 LAB — COMPREHENSIVE METABOLIC PANEL WITH GFR
ALT: 69 U/L — ABNORMAL HIGH (ref 0–44)
AST: 62 U/L — ABNORMAL HIGH (ref 15–41)
Albumin: 3.2 g/dL — ABNORMAL LOW (ref 3.5–5.0)
Alkaline Phosphatase: 70 U/L (ref 38–126)
Anion gap: 19 — ABNORMAL HIGH (ref 5–15)
BUN: 108 mg/dL — ABNORMAL HIGH (ref 6–20)
CO2: 12 mmol/L — ABNORMAL LOW (ref 22–32)
Calcium: 7.2 mg/dL — ABNORMAL LOW (ref 8.9–10.3)
Chloride: 114 mmol/L — ABNORMAL HIGH (ref 98–111)
Creatinine, Ser: 10.7 mg/dL — ABNORMAL HIGH (ref 0.44–1.00)
GFR, Estimated: 4 mL/min — ABNORMAL LOW
Glucose, Bld: 107 mg/dL — ABNORMAL HIGH (ref 70–99)
Potassium: 5.1 mmol/L (ref 3.5–5.1)
Sodium: 146 mmol/L — ABNORMAL HIGH (ref 135–145)
Total Bilirubin: 0.4 mg/dL (ref 0.0–1.2)
Total Protein: 6.2 g/dL — ABNORMAL LOW (ref 6.5–8.1)

## 2024-07-17 LAB — CBC
HCT: 22 % — ABNORMAL LOW (ref 36.0–46.0)
Hemoglobin: 7.1 g/dL — ABNORMAL LOW (ref 12.0–15.0)
MCH: 29 pg (ref 26.0–34.0)
MCHC: 32.3 g/dL (ref 30.0–36.0)
MCV: 89.8 fL (ref 80.0–100.0)
Platelets: 215 K/uL (ref 150–400)
RBC: 2.45 MIL/uL — ABNORMAL LOW (ref 3.87–5.11)
RDW: 14.9 % (ref 11.5–15.5)
WBC: 9.2 K/uL (ref 4.0–10.5)
nRBC: 0 % (ref 0.0–0.2)

## 2024-07-17 LAB — GLUCOSE, CAPILLARY
Glucose-Capillary: 86 mg/dL (ref 70–99)
Glucose-Capillary: 136 mg/dL — ABNORMAL HIGH (ref 70–99)

## 2024-07-17 LAB — HEPATITIS B SURFACE ANTIBODY, QUANTITATIVE: Hep B S AB Quant (Post): 257 m[IU]/mL

## 2024-07-17 MED ORDER — LIDOCAINE HCL (PF) 1 % IJ SOLN: 5.0000 mL | INTRAMUSCULAR | Status: DC | PRN

## 2024-07-17 MED ORDER — ALTEPLASE 2 MG IJ SOLR: 2.0000 mg | Freq: Once | INTRAMUSCULAR | Status: DC | PRN

## 2024-07-17 MED ORDER — LIDOCAINE-PRILOCAINE 2.5-2.5 % EX CREA: 1.0000 | TOPICAL_CREAM | CUTANEOUS | Status: DC | PRN

## 2024-07-17 MED ORDER — ANTICOAGULANT SODIUM CITRATE 4% (200MG/5ML) IV SOLN: 5.0000 mL | Status: DC | PRN

## 2024-07-17 MED ORDER — CHLORHEXIDINE GLUCONATE CLOTH 2 % EX PADS: 6.0000 | MEDICATED_PAD | Freq: Every day | CUTANEOUS | Status: DC

## 2024-07-17 MED ORDER — HEPARIN SODIUM (PORCINE) 1000 UNIT/ML DIALYSIS: 1000.0000 [IU] | INTRAMUSCULAR | Status: DC | PRN

## 2024-07-17 MED ORDER — PENTAFLUOROPROP-TETRAFLUOROETH EX AERO: 1.0000 | INHALATION_SPRAY | CUTANEOUS | Status: DC | PRN

## 2024-07-17 MED ORDER — ONDANSETRON HCL 4 MG/2ML IJ SOLN
INTRAMUSCULAR | Status: AC
  Filled 2024-07-17: qty 2

## 2024-07-17 NOTE — Progress Notes (Signed)
 [facility] Progress Note  Name: Kenzli Barritt MRN: [id number] DOB: Jul 05, 1981  Chief Complaint:  Leg swelling   Subjective:  She had no uop charted over 12/30 HD RN had difficulty accessing pt early AM but she was able to be started when day shift arrived.  Seen and examined on dialysis.  Procedure supervised.  Blood pressure 170/83 and HR 109.  Tolerating goal.  Left AVF in use.    Review of systems:    She reports shortness of breath - improved quite a bit   Denies chest pain Nausea recently and UF is off 2/2 the same   ------------------------ Background on consult:  Barbar Brede is a 43 y.o. female with a history of ESRD on HD, HTN, diabetes mellitus and morbid obesity who presented to the ER at [facility].  She reported not getting good or much dialysis for 2 weeks due to the access not functioning well.  She states last Tuesday she had HD for less than an hour and then had an infiltration.  Then she didn't have HD again outpatient and her outpatient unit directed her to the ER.  She states last full HD treatment was likely 2 weeks ago.  She has gained fluid weight - reportedly 15 lbs.  PRBC's were ordered - not able to be transfused at [facility].  She has been short of breath and on room air.  She has arrived at [facility].  She has been ordered 2 units of PRBC's and is currently getting one unit.  She got lasix  40 mg IV once.      No intake or output data in the 24 hours ending 07/17/24 0939   Vitals:  Vitals:   07/17/24 0730 07/17/24 0800 07/17/24 0830 07/17/24 0900  BP: (!) 145/77 (!) 160/72 (!) 144/75 (!) 155/79  Pulse: 99 (!) 104 99 (!) 102  Resp: 19 16 15 16   Temp:      TempSrc:      SpO2: 100% 100% 99% 99%  Weight:      Height:         Physical Exam:    General adult female in bed in no acute distress HEENT normocephalic atraumatic extraocular movements intact sclera anicteric Neck supple trachea midline Lungs clear to auscultation  bilaterally normal work of breathing at rest on room air  Heart S1S2 no rub Abdomen soft nontender nondistended Extremities 3+ edema bilateral lower extremities Psych normal mood and affect Neuro alert and conversant; provides hx and follows commands  Access LUE AV graft in use  Medications reviewed   Labs:     Latest Ref Rng & Units 07/17/2024    3:09 AM 07/16/2024    3:38 AM 07/15/2024    8:32 AM  BMP  Glucose 70 - 99 mg/dL 892  93  85   BUN 6 - 20 mg/dL 891  888  883   Creatinine 0.44 - 1.00 mg/dL 89.29  89.19  89.39   Sodium 135 - 145 mmol/L 146  144  145   Potassium 3.5 - 5.1 mmol/L 5.1  4.8  4.6   Chloride 98 - 111 mmol/L 114  112  109   CO2 22 - 32 mmol/L 12  11  13    Calcium  8.9 - 10.3 mg/dL 7.2  6.9  6.7    Outpatient HD orders:  [place] [facility] schedule  4 hrs  BF 500 DF auto 1.5 3k/2.5 ca EDW 75 kg 07/08/24 is last outpatient tx  I can see with 85.8 kg post weight from HD Then treated 06/27/24 and 06/18/24 outpatient  Left AV graft  Meds: mircera 225 mcg every 2 weeks (which was last given on 12/2); iron 50 mg weekly; hectorol 9 mcg every treatment 3/wee   Assessment/Plan:   # ESRD  - HD today and plan for serial HD - next tx tomorrow.  Usually TTS schedule but appears to be noncompliant    # Poorly functioning AVG  - infiltration last week and pt states poor HD for the past two weeks - s/p shuntogram and widely patent - able to be started by day shift today - would suggest experienced cannulator to start    # Fluid volume overload  - optimize volume status with HD  - continue lasix  80 mg IV once daily - she is urinating, it just isn't being recorded - strict ins/outs and daily weights - reinforced with pt and nursing    # HTN  - listed as being on midodrine  at home however is hypertensive - we have stopped midodrine     # Anemia of CKD - s/p PRBC's x 1 unit here - ordered aranesp  150 mcg every week for now on Tuesdays - additional PRBC's per primary  team discretion        # Metabolic acidosis - HD today  - noncompliant    # Metabolic bone disease  - hyperphosphatemia - HD as above  - on phoslo   - hypocalcemia - on calcitriol  and for HD; s/p IV calcium    Disposition - serial (daily) HD here for now  Katheryn JAYSON Saba, MD 07/17/2024 9:57 AM

## 2024-07-17 NOTE — Progress Notes (Signed)
" °   07/17/24 1105  Vitals  BP (!) 152/73  MAP (mmHg) 97  BP Location Right Arm  BP Method Automatic  Patient Position (if appropriate) Lying  Pulse Rate 99  Pulse Rate Source Monitor  ECG Heart Rate 99  Resp 17  Weight 87.9 kg  Type of Weight Post-Dialysis  Oxygen Therapy  SpO2 96 %  O2 Device Room Air  Pulse Oximetry Type Continuous  During Treatment Monitoring  Intra-Hemodialysis Comments Tx completed  Post Treatment  Dialyzer Clearance Lightly streaked  Liters Processed 62.8  Fluid Removed (mL) 3400 mL  Tolerated HD Treatment Yes  AVG/AVF Arterial Site Held (minutes) 10 minutes  AVG/AVF Venous Site Held (minutes) 10 minutes  Fistula / Graft Left Upper arm Arteriovenous fistula  Placement Date: 05/10/23   Orientation: Left  Access Location: Upper arm  Access Type: Arteriovenous fistula  Site Condition No complications  Fistula / Graft Assessment Present;Thrill;Bruit  Status Deaccessed  Drainage Description None    "

## 2024-07-17 NOTE — Plan of Care (Signed)
   Problem: Coping: Goal: Level of anxiety will decrease Outcome: Progressing   Problem: Pain Managment: Goal: General experience of comfort will improve and/or be controlled Outcome: Progressing

## 2024-07-17 NOTE — Plan of Care (Signed)
   Problem: Education: Goal: Knowledge of General Education information will improve Description: Including pain rating scale, medication(s)/side effects and non-pharmacologic comfort measures Outcome: Completed/Met

## 2024-07-17 NOTE — Progress Notes (Signed)
 [facility]  Dyan Leavelle FMW:[id number]  DOB: May 14, 1981  DOA: 07/15/2024  PCP: [name], MD  07/17/2024,8:27 AM  LOS: 1 day    Code Status:  Full Code     From:  home    43 year old female ESRD TTS [place]dialyzing via [facility] Complicated fistula access issues in the past (see note 06/12/2024 Dr. Gretta vascular) Previous severe anemia secondary to vaginal bleed from endometrial polyps-supposed to be on Megace  40 3 times daily (this was discussed with gynecology 06/13/2024 at last admission)--patient was told to follow-up with gynecology as an outpatient OSA not on PTA CPAP  12/29 came to ED secondary to access note at HD for less than an hour last on 12/23--has gained about 15 pounds In ER found to have anemia with hemoglobin 6.1 transfused 2 units PRBC Hemoccult was negative Because of volume overload was given Lasix  X1 and fistulogram was arranged by IR proBNP was 10,300, troponin was 118 magnesium  2.7, phosphorus 8.4, B12 1473, folic acid  5.5-hCG was negative    Pertinent imaging/studies till date  12/30 shuntogram was performed for left upper arm graft-functioning well including stent   Assessment  & Plan :    Missed x 2 wk dialysis secondary to AV shunt/fistula malfunction left upper arm Left upper access working was not able to be cannulated last night but this morning was able to be cannulated with 16 instead of 18 size needle Supposed to get 4 hours back-to-back HD today/tomorrow and then we will see how she looks Further planning to nephrology/IR regarding access--continue calcitriol  0.5 daily PhosLo  1.3 g 3 times daily Aranesp  150 q. Tuesday folic acid  1 mg, Mircera 200 mcg every 2 weekly, vitamin D  q. Thursday 50,000 May be able to go home?  Late tomorrow--- still significantly volume overloaded with lower extremity edema Is on midodrine  5 3 times daily at home which has been held  Endometrial polyps causing anemia Continue Megace  40 3 times daily-has outpatient  appointment with gynecology later in January  Lower extremity wounds Heal of both sides has a little bit of breakdown she sees a podiatrist she can follow-up with him wounds not examined today  OSA not on CPAP Recommend outpatient follow-up  HTN Could challenge dry weight by dropping the dose of Cardizem  CD 120- Looks like Lasix  80 daily was held and is now on Lasix  IV 80 for now also adjustment as per renal  Data Reviewed today: Sodium 146 potassium 5.1 chloride bicarb 12 BUN/creatinine 108/10.7 AST ALT 62/69 WBC 9.2 hemoglobin 7.1 platelet 215   DVT prophylaxis: None at this time-   Dispo/Global plan: Likely dialyzed back-to-back and discharge tomorrow as per renal     Subjective:   Awake coherent pleasant no distress looks well feels fair no nausea She is eating a hamburger at the bedside she looks quite comfortable Access is working  Objective + exam Vitals:   07/17/24 0649 07/17/24 0700 07/17/24 0730 07/17/24 0800  BP: (!) 154/80 (!) 154/80 (!) 145/77 (!) 160/72  Pulse: 94 95 99 (!) 104  Resp: 18 16 19 16   Temp:      TempSrc:      SpO2: 98% 99% 100% 100%  Weight:      Height:       Filed Weights   07/16/24 0500 07/17/24 0500 07/17/24 0530  Weight: 86.6 kg 89.5 kg (P) 89.5 kg     Examination: EOMI NCAT no focal deficit thick neck Mallampati 2--CTAB no added sound--fistula left upper arm Bilateral lower  extremity edema grade 3--S1-S2 no murmur no rub no gallop--abdomen soft no rebound no guarding--power 5/5  Scheduled Meds:  sodium chloride    Intravenous Once   calcitRIOL   0.5 mcg Oral Daily   calcium  acetate  1,334 mg Oral TID WC   Chlorhexidine  Gluconate Cloth  6 each Topical Q0600   Chlorhexidine  Gluconate Cloth  6 each Topical Q0600   darbepoetin (ARANESP ) injection - DIALYSIS  150 mcg Subcutaneous Q Tue-1800   diltiazem   120 mg Oral q morning   folic acid   1 mg Oral Daily   furosemide   80 mg Intravenous Daily   senna-docusate  1 tablet Oral QHS    sodium chloride  flush  3 mL Intravenous Q12H   Continuous Infusions:  anticoagulant sodium citrate      acetaminophen  **OR** acetaminophen , alteplase , anticoagulant sodium citrate , heparin , hydrALAZINE , HYDROmorphone  (DILAUDID ) injection, ipratropium, lidocaine  (PF), lidocaine -prilocaine , ondansetron  **OR** ondansetron  (ZOFRAN ) IV, oxyCODONE , pentafluoroprop-tetrafluoroeth, traZODone  Time 70  Jai-Gurmukh Govind Furey, MD  [facility]

## 2024-07-18 DIAGNOSIS — N186 End stage renal disease: Secondary | ICD-10-CM | POA: Diagnosis not present

## 2024-07-18 LAB — CBC WITH DIFFERENTIAL/PLATELET
Abs Immature Granulocytes: 0.04 K/uL (ref 0.00–0.07)
Basophils Absolute: 0.1 K/uL (ref 0.0–0.1)
Basophils Relative: 1 %
Eosinophils Absolute: 0.2 K/uL (ref 0.0–0.5)
Eosinophils Relative: 2 %
HCT: 21.8 % — ABNORMAL LOW (ref 36.0–46.0)
Hemoglobin: 7.3 g/dL — ABNORMAL LOW (ref 12.0–15.0)
Immature Granulocytes: 1 %
Lymphocytes Relative: 17 %
Lymphs Abs: 1.3 K/uL (ref 0.7–4.0)
MCH: 29.2 pg (ref 26.0–34.0)
MCHC: 33.5 g/dL (ref 30.0–36.0)
MCV: 87.2 fL (ref 80.0–100.0)
Monocytes Absolute: 0.8 K/uL (ref 0.1–1.0)
Monocytes Relative: 10 %
Neutro Abs: 5.5 K/uL (ref 1.7–7.7)
Neutrophils Relative %: 69 %
Platelets: 238 K/uL (ref 150–400)
RBC: 2.5 MIL/uL — ABNORMAL LOW (ref 3.87–5.11)
RDW: 14.6 % (ref 11.5–15.5)
WBC: 7.8 K/uL (ref 4.0–10.5)
nRBC: 0 % (ref 0.0–0.2)

## 2024-07-18 LAB — RENAL FUNCTION PANEL
Albumin: 3 g/dL — ABNORMAL LOW (ref 3.5–5.0)
Anion gap: 14 (ref 5–15)
BUN: 59 mg/dL — ABNORMAL HIGH (ref 6–20)
CO2: 24 mmol/L (ref 22–32)
Calcium: 7.6 mg/dL — ABNORMAL LOW (ref 8.9–10.3)
Chloride: 104 mmol/L (ref 98–111)
Creatinine, Ser: 6.26 mg/dL — ABNORMAL HIGH (ref 0.44–1.00)
GFR, Estimated: 8 mL/min — ABNORMAL LOW
Glucose, Bld: 100 mg/dL — ABNORMAL HIGH (ref 70–99)
Phosphorus: 5.8 mg/dL — ABNORMAL HIGH (ref 2.5–4.6)
Potassium: 3.9 mmol/L (ref 3.5–5.1)
Sodium: 141 mmol/L (ref 135–145)

## 2024-07-18 LAB — GLUCOSE, CAPILLARY
Glucose-Capillary: 91 mg/dL (ref 70–99)
Glucose-Capillary: 137 mg/dL — ABNORMAL HIGH (ref 70–99)
Glucose-Capillary: 133 mg/dL — ABNORMAL HIGH (ref 70–99)

## 2024-07-18 MED ORDER — FUROSEMIDE 10 MG/ML IJ SOLN
100.0000 mg | Freq: Once | INTRAVENOUS | Status: AC
  Administered 2024-07-18: 100 mg via INTRAVENOUS
  Filled 2024-07-18: qty 10

## 2024-07-18 MED ORDER — ALTEPLASE 2 MG IJ SOLR: 2.0000 mg | Freq: Once | INTRAMUSCULAR | Status: DC | PRN

## 2024-07-18 MED ORDER — HEPARIN SODIUM (PORCINE) 1000 UNIT/ML DIALYSIS: 1000.0000 [IU] | INTRAMUSCULAR | Status: DC | PRN

## 2024-07-18 MED ORDER — PENTAFLUOROPROP-TETRAFLUOROETH EX AERO: 1.0000 | INHALATION_SPRAY | CUTANEOUS | Status: DC | PRN

## 2024-07-18 MED ORDER — LIDOCAINE-PRILOCAINE 2.5-2.5 % EX CREA: 1.0000 | TOPICAL_CREAM | CUTANEOUS | Status: DC | PRN

## 2024-07-18 MED ORDER — ANTICOAGULANT SODIUM CITRATE 4% (200MG/5ML) IV SOLN: 5.0000 mL | Status: DC | PRN

## 2024-07-18 MED ORDER — LIDOCAINE HCL (PF) 1 % IJ SOLN: 5.0000 mL | INTRAMUSCULAR | Status: DC | PRN

## 2024-07-18 NOTE — Progress Notes (Signed)
 " Jacqueline [name] Progress Note    Assessment/ Plan:   # ESRD  -Usually TTS schedule but appears to be noncompliant  -s/p serial HD -HD today, can likely get back on TTS schedule thereafter   # Poorly functioning AVG  - infiltration last week and pt states poor HD for the past two weeks - s/p shuntogram and widely patent -would suggest experienced cannulator to start    # Fluid volume overload, improving  - optimize volume status with HD  - strict ins/outs and daily weights - reinforced with pt and nursing    # HTN  - listed as being on midodrine  at home however is hypertensive - we have stopped midodrine   -UF as tolerated   # Anemia of CKD - s/p PRBC's x 1 unit here - ordered aranesp  150 mcg every week for now on Tuesdays - additional PRBC's per primary team discretion      -hgb 7.3 today   # Metabolic acidosis -improved with HD   # Metabolic bone disease  - hyperphosphatemia, down to 5.8 today - on phoslo   - hypocalcemia - on calcitriol  and for HD; improving   Disposition - if volume status has improved by today or tomorrow (depending on timing of HD), then would be okay with discharge and can resume OP HD this coming Saturday . [facility] orders:  [facility]  4 hrs  BF 500 DF auto 1.5 3k/2.5 ca EDW 75 kg 07/08/24 is last outpatient tx I can see with 85.8 kg post weight from HD Then treated 06/27/24 and 06/18/24 outpatient  Left AV graft  Meds: mircera 225 mcg every 2 weeks (which was last given on 12/2); iron 50 mg weekly; hectorol 9 mcg every treatment 3/week  Subjective:   Patient seen and examined bedside. Jacqueline [name] reports that Jacqueline [name] feels much better, still has some swelling and orthopnea but both are significant improved as per patient. Tolerating HD yesterday with net UF 3.4L   Objective:   BP (!) 140/66 (BP Location: Right Arm)   Pulse 95   Temp 98.6 F (37 C) (Oral)   Resp 18   Ht 5' 2 (1.575 m)   Wt 87.9 kg   LMP 06/15/2024  (Approximate)   SpO2 97%   BMI 35.44 kg/m   Intake/Output Summary (Last 24 hours) at 07/18/2024 0911 Last data filed at 07/18/2024 0457 Gross per 24 hour  Intake 60 ml  Output 3420 ml  Net -3360 ml   Weight change: -1.6 kg  Physical Exam: Gen: NAD CVS: RRR Resp: CTA BL Abd: soft Ext: trace to 1+ pitting edema b/l Les Neuro: awake, alert Dialysis access: LUE AVG  Imaging: IR DIALY SHUNT INTRO NEEDLE/INTRACATH INITIAL W/IMG LEFT Result Date: 07/16/2024 INDICATION: 44 year old with end-stage renal disease. Left upper arm AV graft is not functioning well. EXAM: Left upper extremity shuntogram Ultrasound guidance for vascular access MEDICATIONS: Moderate sedation ANESTHESIA/SEDATION: Moderate (conscious) sedation was employed during this procedure. A total of Versed  2 mg and Fentanyl  75 mcg was administered intravenously by the radiology nurse. Total intra-service moderate Sedation Time: 18 minutes. The patient's level of consciousness and vital signs were monitored continuously by radiology nursing throughout the procedure under my direct supervision. FLUOROSCOPY: Radiation Exposure Index (as provided by the fluoroscopic device): 17 mGy Kerma CONTRAST:  60 mL Omnipaque  300 COMPLICATIONS: None immediate. PROCEDURE: Informed written consent was obtained from the patient after a thorough discussion of the procedural risks, benefits and alternatives. All questions were addressed. Maximal  Sterile Barrier Technique was utilized including caps, mask, sterile gowns, sterile gloves, sterile drape, hand hygiene and skin antiseptic. A timeout was performed prior to the initiation of the procedure. Left upper arm was prepped and draped in sterile fashion. Ultrasound confirmed a patent left upper arm AV graft. Ultrasound image was saved for documentation. Skin was anesthetized 1% lidocaine . Using ultrasound guidance, 21 gauge needle was directed into the graft. Micropuncture dilator set was placed. Left upper  extremity shuntogram images were obtained. Micropuncture catheter was removed with manual compression. FINDINGS: Left upper arm AV graft is widely patent. The vascular stent involving the venous anastomosis and outflow vein is widely patent. Central veins are widely patent. Reflux shuntogram images demonstrate normal tapering near the arterial anastomosis with no significant stenosis. Arterial anastomosis is widely patent. IMPRESSION: Left upper arm AV graft is widely patent. No intervention performed. ACCESS: This access remains amenable to future percutaneous interventions as clinically indicated. Electronically Signed   By: Juliene Balder M.D.   On: 07/16/2024 20:53    Labs: BMET Recent Labs  Lab 07/15/24 0832 07/15/24 1012 07/16/24 0338 07/17/24 0309 07/18/24 0236  NA 145  --  144 146* 141  K 4.6  --  4.8 5.1 3.9  CL 109  --  112* 114* 104  CO2 13*  --  11* 12* 24  GLUCOSE 85  --  93 107* 100*  BUN 116*  --  111* 108* 59*  CREATININE 10.60*  --  10.80* 10.70* 6.26*  CALCIUM  6.7*  --  6.9* 7.2* 7.6*  PHOS  --  8.4*  --   --  5.8*   CBC Recent Labs  Lab 07/15/24 0832 07/16/24 0338 07/17/24 0309 07/18/24 0236  WBC 9.2 9.8 9.2 7.8  NEUTROABS 6.3  --   --  5.5  HGB 6.1* 7.4* 7.1* 7.3*  HCT 19.1* 22.7* 22.0* 21.8*  MCV 88.8 89.4 89.8 87.2  PLT 202 217 215 238    Medications:     sodium chloride    Intravenous Once   calcitRIOL   0.5 mcg Oral Daily   calcium  acetate  1,334 mg Oral TID WC   darbepoetin (ARANESP ) injection - DIALYSIS  150 mcg Subcutaneous Q Tue-1800   diltiazem   120 mg Oral q morning   folic acid   1 mg Oral Daily   furosemide   80 mg Intravenous Daily   senna-docusate  1 tablet Oral QHS   sodium chloride  flush  3 mL Intravenous Q12H      Ephriam Stank, MD [name] 07/18/2024, 9:11 AM   "

## 2024-07-18 NOTE — Progress Notes (Signed)
 Patient access was stuck 3 times on venous access, was painful, and pulled clots.  Dr. Dennise, Dr. Jerrye, and Dr. Royal informed.  Patient did not run today and will get a HD catheter sometime tomorrow.  Camellia Brasil, LPN, KDU

## 2024-07-18 NOTE — Progress Notes (Signed)
 Pt has order to be dialyzed 07/18/24, notified MD-Singh pt is on schedule for dialysis today but pt may be dialyzed this afternoon or evening shift. MD-Singh ok with pt discharging before dialysis given and pt dialyzing outpatient regular schedule.

## 2024-07-18 NOTE — Discharge Summary (Signed)
 Physician Discharge Summary  Jacqueline [name] FMW:[id number] DOB: Feb 15, 1981 DOA: 07/15/2024  PCP: [name], MD  Admit date: 07/15/2024 Discharge date: 07/18/2024  Time spent: 45 minutes  Recommendations for Outpatient Follow-up:  Needs outpatient addressing meds (diltiazem ) and EDW challenging by nephrology Midodrine  stopped this hospitalization Follow-up as an outpatient with GYN regarding use of Megace  and further workup of endometrial polyps  Discharge Diagnoses:  MAIN problem for hospitalization   Volume overload in the setting of anemia  Please see below for itemized issues addressed in HOpsital- refer to other progress notes for clarity if needed  Discharge Condition: Improved  Diet recommendation: Renal heart healthy  Filed Weights   07/17/24 0530 07/17/24 1102 07/17/24 1105  Weight: (P) 89.5 kg 87.9 kg 87.9 kg    History of present illness:  44 year old female ESRD TTS [place]-dialyzing via [facility] Complicated fistula access issues in the past (see note 06/12/2024 Dr. Gretta vascular) Previous severe anemia secondary to vaginal bleed from endometrial polyps-supposed to be on Megace  40 3 times daily (this was discussed with gynecology 06/13/2024 at last admission)--patient was told to follow-up with gynecology as an outpatient OSA not on PTA CPAP   12/29 came to ED secondary to access note at HD for less than an hour last on 12/23--has gained about 15 pounds In ER found to have anemia with hemoglobin 6.1 transfused 2 units PRBC Hemoccult was negative Because of volume overload was given Lasix  X1 and fistulogram was arranged by IR proBNP was 10,300, troponin was 118 magnesium  2.7, phosphorus 8.4, B12 1473, folic acid  5.5-hCG was negative      Pertinent imaging/studies till date  12/30 shuntogram was performed for left upper arm graft-functioning well including stent    Assessment  & Plan :      Missed x 2 wk dialysis secondary to AV shunt/fistula  malfunction left upper arm Left upper access working --was having difficulty cannulating as an outpatient-needs an experienced cannulate her Supposed to get 4 hours back-to-back HD but is on holiday schedule-hopeful for dialysis later today otherwise we will give a high dose of Lasix  100 and send her home as she is passing good urine while getting the HD Further planning to nephrology/IR regarding access- Was on midodrine  5 3 times daily at home which has been held   Endometrial polyps causing anemia Continue Megace  40 3 times daily-has outpatient appointment with gynecology later in January   Lower extremity wounds Heel of both sides has a little bit of breakdown she sees a podiatrist she can follow-up with him wounds not examined today   OSA not on CPAP Recommend outpatient follow-up   HTN Could challenge dry weight by dropping the dose of Cardizem  CD 120- Looks like Lasix  80 daily was held and is now on Lasix  IV 80 f If cannot get on HD schedule will give Lasix  100 and go from there in terms of discharge disposition    Discharge Exam: Vitals:   07/18/24 0422 07/18/24 0939  BP: (!) 140/66 (!) 150/73  Pulse: 95 99  Resp: 18   Temp: 98.6 F (37 C) 98.5 F (36.9 C)  SpO2: 97% 100%    Subj on day of d/c   Awake coherent no distress looks fair feels well  General Exam on discharge  EOMI NCAT no focal deficit no icterus no pallor no wheeze no rales no rhonchi Feels little short of breath laying flat Power 5/5 Trace lower extremity edema  Discharge Instructions   Discharge Instructions  Discharge instructions   Complete by: As directed    Please take your medications as indicated please take all of them-we will try and dialyze you today but if on the schedule you are unable to get dialyzed we will give you another high dose of Lasix  IV and send you home but you need to follow-up closely with your home dialysis center on Saturday and get fluid pulled off Continue   renal restrictions do not drink too much do not eat too much salt Best of luck   Increase activity slowly   Complete by: As directed       Allergies as of 07/18/2024   No Known Allergies      Medication List     STOP taking these medications    midodrine  5 MG tablet Commonly known as: PROAMATINE        TAKE these medications    acetaminophen  500 MG tablet Commonly known as: TYLENOL  Take 1,000 mg by mouth 2 (two) times daily as needed for moderate pain (pain score 4-6), fever or headache.   calcitRIOL  0.5 MCG capsule Commonly known as: ROCALTROL  Take 0.5 mcg by mouth daily. Prior to dialysis on Tuesday, Thursday, Saturdays only   calcium  acetate 667 MG capsule Commonly known as: PHOSLO  Take 1,334 mg by mouth 3 (three) times daily with meals.   diltiazem  120 MG 24 hr capsule Commonly known as: CARDIZEM  CD Take 1 capsule (120 mg total) by mouth every morning.   furosemide  40 MG tablet Commonly known as: LASIX  Take 80 mg by mouth daily.   megestrol  40 MG tablet Commonly known as: MEGACE  Take 1 tablet (40 mg total) by mouth 3 (three) times daily.   MIRCERA IJ 200 mcg. During Dialysis, Every 2 weeks   Vitamin D  (Ergocalciferol ) 1.25 MG (50000 UNIT) Caps capsule Commonly known as: DRISDOL  Take 1 capsule (50,000 Units total) by mouth every Thursday.       Allergies[1]    The results of significant diagnostics from this hospitalization (including imaging, microbiology, ancillary and laboratory) are listed below for reference.    Significant Diagnostic Studies: IR DIALY SHUNT INTRO NEEDLE/INTRACATH INITIAL W/IMG LEFT Result Date: 07/16/2024 INDICATION: 44 year old with end-stage renal disease. Left upper arm AV graft is not functioning well. EXAM: Left upper extremity shuntogram Ultrasound guidance for vascular access MEDICATIONS: Moderate sedation ANESTHESIA/SEDATION: Moderate (conscious) sedation was employed during this procedure. A total of Versed  2 mg and  Fentanyl  75 mcg was administered intravenously by the radiology nurse. Total intra-service moderate Sedation Time: 18 minutes. The patient's level of consciousness and vital signs were monitored continuously by radiology nursing throughout the procedure under my direct supervision. FLUOROSCOPY: Radiation Exposure Index (as provided by the fluoroscopic device): 17 mGy Kerma CONTRAST:  60 mL Omnipaque  300 COMPLICATIONS: None immediate. PROCEDURE: Informed written consent was obtained from the patient after a thorough discussion of the procedural risks, benefits and alternatives. All questions were addressed. Maximal Sterile Barrier Technique was utilized including caps, mask, sterile gowns, sterile gloves, sterile drape, hand hygiene and skin antiseptic. A timeout was performed prior to the initiation of the procedure. Left upper arm was prepped and draped in sterile fashion. Ultrasound confirmed a patent left upper arm AV graft. Ultrasound image was saved for documentation. Skin was anesthetized 1% lidocaine . Using ultrasound guidance, 21 gauge needle was directed into the graft. Micropuncture dilator set was placed. Left upper extremity shuntogram images were obtained. Micropuncture catheter was removed with manual compression. FINDINGS: Left upper arm AV graft is widely patent.  The vascular stent involving the venous anastomosis and outflow vein is widely patent. Central veins are widely patent. Reflux shuntogram images demonstrate normal tapering near the arterial anastomosis with no significant stenosis. Arterial anastomosis is widely patent. IMPRESSION: Left upper arm AV graft is widely patent. No intervention performed. ACCESS: This access remains amenable to future percutaneous interventions as clinically indicated. Electronically Signed   By: Juliene Balder M.D.   On: 07/16/2024 20:53   DG Chest Portable 1 View Result Date: 07/15/2024 EXAM: 1 VIEW(S) XRAY OF THE CHEST 07/15/2024 08:23:00 AM COMPARISON:  06/10/2022 CLINICAL HISTORY: cp FINDINGS: LUNGS AND PLEURA: Low lung volume. Prominent pulmonary vasculature. No focal pulmonary opacity. No pleural effusion. No pneumothorax. HEART AND MEDIASTINUM: Mild cardiomegaly. BONES AND SOFT TISSUES: Chronic fracture deformity of left posterior sixth and seventh ribs. IMPRESSION: 1. No acute cardiopulmonary abnormality. 2. Mild cardiomegaly with prominent pulmonary vasculature. Electronically signed by: Waddell Calk MD 07/15/2024 08:32 AM EST RP Workstation: [id number]    Microbiology: No results found for this or any previous visit (from the past 240 hours).   Labs: Basic Metabolic Panel: Recent Labs  Lab 07/15/24 0832 07/15/24 1012 07/16/24 0338 07/17/24 0309 07/18/24 0236  NA 145  --  144 146* 141  K 4.6  --  4.8 5.1 3.9  CL 109  --  112* 114* 104  CO2 13*  --  11* 12* 24  GLUCOSE 85  --  93 107* 100*  BUN 116*  --  111* 108* 59*  CREATININE 10.60*  --  10.80* 10.70* 6.26*  CALCIUM  6.7*  --  6.9* 7.2* 7.6*  MG  --  2.7*  --   --   --   PHOS  --  8.4*  --   --  5.8*   Liver Function Tests: Recent Labs  Lab 07/15/24 0832 07/16/24 0338 07/17/24 0309 07/18/24 0236  AST 110* 57* 62*  --   ALT 100* 80* 69*  --   ALKPHOS 75 67 70  --   BILITOT 0.5 0.5 0.4  --   PROT 6.8 6.4* 6.2*  --   ALBUMIN  3.7 3.2* 3.2* 3.0*   No results for input(s): LIPASE, AMYLASE in the last 168 hours. No results for input(s): AMMONIA in the last 168 hours. CBC: Recent Labs  Lab 07/15/24 0832 07/16/24 0338 07/17/24 0309 07/18/24 0236  WBC 9.2 9.8 9.2 7.8  NEUTROABS 6.3  --   --  5.5  HGB 6.1* 7.4* 7.1* 7.3*  HCT 19.1* 22.7* 22.0* 21.8*  MCV 88.8 89.4 89.8 87.2  PLT 202 217 215 238   Cardiac Enzymes: No results for input(s): CKTOTAL, CKMB, CKMBINDEX, TROPONINI in the last 168 hours. BNP: BNP (last 3 results) Recent Labs    10/11/23 0920  BNP 103.5*    ProBNP (last 3 results) Recent Labs    06/10/24 0921 07/15/24 0832   PROBNP 4,761.0* 10,336.0*    CBG: Recent Labs  Lab 07/16/24 0838 07/17/24 1214 07/17/24 2128 07/18/24 0746  GLUCAP 75 86 136* 91    Signed:  Colen Grimes MD   [facility] 07/18/2024, 11:36 AM      [1] No Known Allergies

## 2024-07-18 NOTE — TOC Transition Note (Signed)
 Transition of Care [facility]) - Discharge Note   Patient Details  Name: Jacqueline [name] MRN: [id number] Date of Birth: 17-Sep-1980  Transition [facility] Care [facility] CM/SW Contact:  Waddell Barnie Rama, RN Phone Number: 07/18/2024, 11:42 AM   Clinical Narrative:    For dc today , has no needs.         Patient Goals and CMS Choice            Discharge Placement                       Discharge Plan and Services Additional resources added to the After Visit Summary for                                       Social Drivers of Health (SDOH) Interventions SDOH Screenings   Food Insecurity: No Food Insecurity (07/15/2024)  Housing: Low Risk (07/15/2024)  Transportation Needs: No Transportation Needs (07/15/2024)  Utilities: Not At Risk (07/15/2024)  Social Connections: Socially Integrated (10/11/2023)  Tobacco Use: Low Risk (07/15/2024)     Readmission Risk Interventions    07/16/2024    4:59 PM 02/09/2024    4:24 PM 10/12/2023   11:30 AM  Readmission Risk Prevention Plan  Transportation Screening Complete Complete Complete  PCP or Specialist Appt within 5-7 Days   Complete  PCP or Specialist Appt within 3-5 Days  Complete   Home Care Screening   Complete  Medication Review (RN CM)   Complete  HRI or Home Care Consult  Complete   Social Work Consult for Recovery Care Planning/Counseling  Complete   Palliative Care Screening  Not Applicable   Medication Review Oceanographer) Referral to Pharmacy Referral to Pharmacy   PCP or Specialist appointment within 3-5 days of discharge Complete    HRI or Home Care Consult Complete    SW Recovery Care/Counseling Consult Complete    Palliative Care Screening Not Applicable    Skilled Nursing Facility Not Applicable

## 2024-07-19 ENCOUNTER — Inpatient Hospital Stay (HOSPITAL_COMMUNITY)

## 2024-07-19 ENCOUNTER — Encounter (HOSPITAL_COMMUNITY): Payer: Self-pay

## 2024-07-19 DIAGNOSIS — N186 End stage renal disease: Secondary | ICD-10-CM | POA: Diagnosis not present

## 2024-07-19 HISTORY — PX: IR TUNNELED CENTRAL VENOUS CATH PLC W IMG: IMG1939

## 2024-07-19 LAB — CBC WITH DIFFERENTIAL/PLATELET
Abs Immature Granulocytes: 0.08 K/uL — ABNORMAL HIGH (ref 0.00–0.07)
Basophils Absolute: 0.1 K/uL (ref 0.0–0.1)
Basophils Relative: 1 %
Eosinophils Absolute: 0.4 K/uL (ref 0.0–0.5)
Eosinophils Relative: 4 %
HCT: 22 % — ABNORMAL LOW (ref 36.0–46.0)
Hemoglobin: 7.2 g/dL — ABNORMAL LOW (ref 12.0–15.0)
Immature Granulocytes: 1 %
Lymphocytes Relative: 24 %
Lymphs Abs: 2.2 K/uL (ref 0.7–4.0)
MCH: 29 pg (ref 26.0–34.0)
MCHC: 32.7 g/dL (ref 30.0–36.0)
MCV: 88.7 fL (ref 80.0–100.0)
Monocytes Absolute: 1 K/uL (ref 0.1–1.0)
Monocytes Relative: 11 %
Neutro Abs: 5.5 K/uL (ref 1.7–7.7)
Neutrophils Relative %: 59 %
Platelets: 227 K/uL (ref 150–400)
RBC: 2.48 MIL/uL — ABNORMAL LOW (ref 3.87–5.11)
RDW: 14.6 % (ref 11.5–15.5)
WBC: 9.2 K/uL (ref 4.0–10.5)
nRBC: 0 % (ref 0.0–0.2)

## 2024-07-19 LAB — GLUCOSE, CAPILLARY: Glucose-Capillary: 78 mg/dL (ref 70–99)

## 2024-07-19 LAB — TYPE AND SCREEN
ABO/RH(D): B POS
Antibody Screen: NEGATIVE
Unit division: 0
Unit division: 0

## 2024-07-19 LAB — BPAM RBC
Blood Product Expiration Date: 202601252359
Blood Product Expiration Date: 202601252359
ISSUE DATE / TIME: 202512291735
Unit Type and Rh: 7300
Unit Type and Rh: 7300

## 2024-07-19 LAB — RENAL FUNCTION PANEL
Albumin: 3.1 g/dL — ABNORMAL LOW (ref 3.5–5.0)
Anion gap: 14 (ref 5–15)
BUN: 67 mg/dL — ABNORMAL HIGH (ref 6–20)
CO2: 22 mmol/L (ref 22–32)
Calcium: 7.1 mg/dL — ABNORMAL LOW (ref 8.9–10.3)
Chloride: 106 mmol/L (ref 98–111)
Creatinine, Ser: 7.09 mg/dL — ABNORMAL HIGH (ref 0.44–1.00)
GFR, Estimated: 7 mL/min — ABNORMAL LOW
Glucose, Bld: 90 mg/dL (ref 70–99)
Phosphorus: 6 mg/dL — ABNORMAL HIGH (ref 2.5–4.6)
Potassium: 4 mmol/L (ref 3.5–5.1)
Sodium: 142 mmol/L (ref 135–145)

## 2024-07-19 MED ORDER — SODIUM CHLORIDE 0.9% FLUSH
10.0000 mL | Freq: Two times a day (BID) | INTRAVENOUS | Status: DC
  Administered 2024-07-19 – 2024-07-20 (×2): 10 mL
  Administered 2024-07-21: 20 mL
  Administered 2024-07-22: 10 mL

## 2024-07-19 MED ORDER — GELATIN ABSORBABLE 12-7 MM EX MISC
1.0000 | Freq: Once | CUTANEOUS | Status: AC
  Administered 2024-07-19: 1

## 2024-07-19 MED ORDER — CEFAZOLIN SODIUM-DEXTROSE 2-4 GM/100ML-% IV SOLN
2.0000 g | Freq: Once | INTRAVENOUS | Status: AC
  Filled 2024-07-19: qty 100

## 2024-07-19 MED ORDER — LIDOCAINE-EPINEPHRINE 1 %-1:100000 IJ SOLN
20.0000 mL | Freq: Once | INTRAMUSCULAR | Status: AC
  Administered 2024-07-19: 20 mL via INTRADERMAL

## 2024-07-19 MED ORDER — HEPARIN SODIUM (PORCINE) 1000 UNIT/ML IJ SOLN
INTRAMUSCULAR | Status: AC
  Filled 2024-07-19: qty 4

## 2024-07-19 MED ORDER — HEPARIN SODIUM (PORCINE) 1000 UNIT/ML IJ SOLN
10000.0000 [IU] | Freq: Once | INTRAMUSCULAR | Status: AC
  Administered 2024-07-19: 3200 [IU]

## 2024-07-19 MED ORDER — FENTANYL CITRATE (PF) 100 MCG/2ML IJ SOLN
INTRAMUSCULAR | Status: AC
  Filled 2024-07-19: qty 2

## 2024-07-19 MED ORDER — CEFAZOLIN SODIUM-DEXTROSE 2-4 GM/100ML-% IV SOLN
INTRAVENOUS | Status: AC | PRN
  Administered 2024-07-19: 2 g via INTRAVENOUS

## 2024-07-19 MED ORDER — HYDROMORPHONE HCL 1 MG/ML IJ SOLN
INTRAMUSCULAR | Status: AC
  Filled 2024-07-19: qty 1

## 2024-07-19 MED ORDER — SODIUM CHLORIDE 0.9% FLUSH: 10.0000 mL | INTRAVENOUS | Status: DC | PRN

## 2024-07-19 MED ORDER — LIDOCAINE HCL 1 % IJ SOLN
20.0000 mL | Freq: Once | INTRAMUSCULAR | Status: AC
  Administered 2024-07-19: 10 mL via INTRADERMAL

## 2024-07-19 MED ORDER — LIDOCAINE HCL 1 % IJ SOLN
INTRAMUSCULAR | Status: AC
  Filled 2024-07-19: qty 20

## 2024-07-19 MED ORDER — CEFAZOLIN SODIUM-DEXTROSE 2-4 GM/100ML-% IV SOLN
INTRAVENOUS | Status: AC
  Filled 2024-07-19: qty 100

## 2024-07-19 MED ORDER — MIDAZOLAM HCL (PF) 2 MG/2ML IJ SOLN
INTRAMUSCULAR | Status: AC | PRN
  Administered 2024-07-19: .5 mg via INTRAVENOUS
  Administered 2024-07-19: 1 mg via INTRAVENOUS
  Administered 2024-07-19: .5 mg via INTRAVENOUS

## 2024-07-19 MED ORDER — GELATIN ABSORBABLE 12-7 MM EX MISC
CUTANEOUS | Status: AC
  Filled 2024-07-19: qty 1

## 2024-07-19 MED ORDER — CHLORHEXIDINE GLUCONATE CLOTH 2 % EX PADS
6.0000 | MEDICATED_PAD | Freq: Every day | CUTANEOUS | Status: DC
  Administered 2024-07-19 – 2024-07-22 (×4): 6 via TOPICAL

## 2024-07-19 MED ORDER — HEPARIN SODIUM (PORCINE) 1000 UNIT/ML IJ SOLN
INTRAMUSCULAR | Status: AC
  Filled 2024-07-19: qty 10

## 2024-07-19 MED ORDER — FENTANYL CITRATE (PF) 100 MCG/2ML IJ SOLN
INTRAMUSCULAR | Status: AC | PRN
  Administered 2024-07-19: 25 ug via INTRAVENOUS
  Administered 2024-07-19: 50 ug via INTRAVENOUS
  Administered 2024-07-19: 25 ug via INTRAVENOUS

## 2024-07-19 MED ORDER — LIDOCAINE-EPINEPHRINE 1 %-1:100000 IJ SOLN
INTRAMUSCULAR | Status: AC
  Filled 2024-07-19: qty 1

## 2024-07-19 MED ORDER — MIDAZOLAM HCL 2 MG/2ML IJ SOLN
INTRAMUSCULAR | Status: AC
  Filled 2024-07-19: qty 2

## 2024-07-19 NOTE — Progress Notes (Signed)
 " Jacqueline [name] Progress Note    Assessment/ Plan:   # ESRD  -Usually TTS schedule but appears to be noncompliant  -s/p serial HD -HD today to make up then again tomorrow   # Poorly functioning AVG  - infiltration last week and pt states poor HD for the past two weeks - s/p shuntogram and widely patent. Has had multiple issues with cannulation given deep in nature therefore opted for [facility] for now which is pending, appreciate IR. Can revisit perm access as OP   # Fluid volume overload, improving  - optimize volume status with HD  - strict ins/outs and daily weights - reinforced with pt and nursing    # HTN  - listed as being on midodrine  at home however is hypertensive - we have stopped midodrine  -UF as tolerated   # Anemia of CKD - s/p PRBC's x 1 unit here - ordered aranesp  150 mcg every week for now on Tuesdays - additional PRBC's per primary team discretion - hgb 7.2 today   # Metabolic acidosis -improved with HD   # Metabolic bone disease  - hyperphosphatemia, up to 6 - on phoslo   - hypocalcemia - on calcitriol  and for HD . [facility] HD [facility]:  [facility] 4 hrs  BF 500 DF auto 1.5 3k/2.5 ca EDW 75 kg 07/08/24 is last outpatient tx I can see with 85.8 kg post weight from HD Then treated 06/27/24 and 06/18/24 outpatient  Left AV graft  Meds: mircera 225 mcg every 2 weeks (which was last given on 12/2); iron 50 mg weekly; hectorol 9 mcg every treatment 3/week  Subjective:   Patient seen and examined bedside.  Did not run on HD yesterday, issues with cannulation again, pulling clots, deep in nature. Jacqueline [name] did receive high dose lasix  yesterday,UOP chartd 1L. Jacqueline [name] reports that Jacqueline [name] continues to feel better. Currently NPO for procedure   Objective:   BP (!) 143/75 (BP Location: Left Arm)   Pulse 95   Temp 98.3 F (36.8 C) (Oral)   Resp 18   Ht 5' 2 (1.575 m)   Wt 80.8 kg   LMP 06/15/2024 (Approximate)   SpO2 98%   BMI 32.58 kg/m   Intake/Output  Summary (Last 24 hours) at 07/19/2024 9092 Last data filed at 07/19/2024 0600 Gross per 24 hour  Intake 480 ml  Output 0 ml  Net 480 ml   Weight change: -7 kg  Physical Exam: Gen: NAD CVS: RRR Resp: CTA BL Abd: soft Ext: trace b/l LEs edema Neuro: awake, alert Dialysis access: LUE AVG  Imaging: No results found.   Labs: BMET Recent Labs  Lab 07/15/24 0832 07/15/24 1012 07/16/24 0338 07/17/24 0309 07/18/24 0236 07/19/24 0334  NA 145  --  144 146* 141 142  K 4.6  --  4.8 5.1 3.9 4.0  CL 109  --  112* 114* 104 106  CO2 13*  --  11* 12* 24 22  GLUCOSE 85  --  93 107* 100* 90  BUN 116*  --  111* 108* 59* 67*  CREATININE 10.60*  --  10.80* 10.70* 6.26* 7.09*  CALCIUM  6.7*  --  6.9* 7.2* 7.6* 7.1*  PHOS  --  8.4*  --   --  5.8* 6.0*   CBC Recent Labs  Lab 07/15/24 0832 07/16/24 0338 07/17/24 0309 07/18/24 0236 07/19/24 0334  WBC 9.2 9.8 9.2 7.8 9.2  NEUTROABS 6.3  --   --  5.5 5.5  HGB 6.1* 7.4* 7.1*  7.3* 7.2*  HCT 19.1* 22.7* 22.0* 21.8* 22.0*  MCV 88.8 89.4 89.8 87.2 88.7  PLT 202 217 215 238 227    Medications:     sodium chloride    Intravenous Once   calcitRIOL   0.5 mcg Oral Daily   calcium  acetate  1,334 mg Oral TID WC   darbepoetin (ARANESP ) injection - DIALYSIS  150 mcg Subcutaneous Q Tue-1800   diltiazem   120 mg Oral q morning   folic acid   1 mg Oral Daily   furosemide   80 mg Intravenous Daily   senna-docusate  1 tablet Oral QHS   sodium chloride  flush  3 mL Intravenous Q12H      Ephriam Stank, MD [name] 07/19/2024, 9:07 AM   "

## 2024-07-19 NOTE — Procedures (Signed)
 Interventional Radiology Procedure:   Indications: ESRD and poorly functioning left arm AV graft.  Procedure: Placement of tunneled dialysis catheter  Findings: Right jugular Palindrome, 19 cm, tip at SVC/RA junction   Complications: None     EBL: Minimal  Plan:  HD catheter is ready to use.    Najeeb Uptain R. Philip, MD  Pager: [phone]

## 2024-07-19 NOTE — Progress Notes (Signed)
 "   Referring Physician(s): Dr. Jerrye  Supervising Physician: Philip Cornet  Patient Status:  [facility] - In-pt  Chief Complaint: Malfunctioning fistula  Subjective: Patient s/p fistulagram 07/16/24 demonstrating patent access however deep frequently infilltrated with ongoing issues with reliable dialysis.  Nephrology requesting placement of a tunneled HD catheter.  Patient assessed at bedside.  She is understanding and agreeable.  Has had right-sided catheters in the past without issue.   Allergies: Patient has no known allergies.  Medications: Prior to Admission medications  Medication Sig Start Date End Date Taking? Authorizing Provider  acetaminophen  (TYLENOL ) 500 MG tablet Take 1,000 mg by mouth 2 (two) times daily as needed for moderate pain (pain score 4-6), fever or headache.   Yes [provider]  calcitRIOL  (ROCALTROL ) 0.5 MCG capsule Take 0.5 mcg by mouth daily. Prior to dialysis on Tuesday, Thursday, Saturdays only 10/03/23  Yes [provider]  calcium  acetate (PHOSLO ) 667 MG capsule Take 1,334 mg by mouth 3 (three) times daily with meals.   Yes [provider]  diltiazem  (CARDIZEM  CD) 120 MG 24 hr capsule Take 1 capsule (120 mg total) by mouth every morning. 02/16/24  Yes Gherghe, Costin M, MD  furosemide  (LASIX ) 40 MG tablet Take 80 mg by mouth daily. 11/15/23  Yes [provider]  megestrol  (MEGACE ) 40 MG tablet Take 1 tablet (40 mg total) by mouth 3 (three) times daily. 06/13/24  Yes Vann, Jessica U, DO  Methoxy PEG-Epoetin  Beta (MIRCERA IJ) 200 mcg. During Dialysis, Every 2 weeks 06/18/24 06/17/25 Yes [provider]  midodrine  (PROAMATINE ) 5 MG tablet Take 5 mg by mouth 3 (three) times daily with meals.   Yes [provider]  Vitamin D , Ergocalciferol , (DRISDOL ) 1.25 MG (50000 UNIT) CAPS capsule Take 1 capsule (50,000 Units total) by mouth every Thursday. 07/07/22  Yes Angiulli, Toribio PARAS, PA-C     Vital Signs: BP (!)  143/75 (BP Location: Left Arm)   Pulse 95   Temp 98.3 F (36.8 C) (Oral)   Resp 18   Ht 5' 2 (1.575 m)   Wt 178 lb 2.1 oz (80.8 kg)   LMP 06/15/2024 (Approximate)   SpO2 98%   BMI 32.58 kg/m   Physical Exam Vitals and nursing note reviewed.  Constitutional:      General: She is not in acute distress.    Appearance: She is well-developed. She is not ill-appearing.  Cardiovascular:     Rate and Rhythm: Normal rate and regular rhythm.  Pulmonary:     Effort: Pulmonary effort is normal.     Breath sounds: Normal breath sounds.  Chest:     Comments: Well-healed scars to right chest at site of prior tunneled lines Skin:    General: Skin is warm and dry.  Neurological:     General: No focal deficit present.     Mental Status: She is alert and oriented to person, place, and time.     Imaging: IR DIALY SHUNT INTRO NEEDLE/INTRACATH INITIAL W/IMG LEFT Result Date: 07/16/2024 INDICATION: 44 year old with end-stage renal disease. Left upper arm AV graft is not functioning well. EXAM: Left upper extremity shuntogram Ultrasound guidance for vascular access MEDICATIONS: Moderate sedation ANESTHESIA/SEDATION: Moderate (conscious) sedation was employed during this procedure. A total of Versed  2 mg and Fentanyl  75 mcg was administered intravenously by the radiology nurse. Total intra-service moderate Sedation Time: 18 minutes. The patient's level of consciousness and vital signs were monitored continuously by radiology nursing throughout the procedure under my direct supervision. FLUOROSCOPY:  Radiation Exposure Index (as provided by the fluoroscopic device): 17 mGy Kerma CONTRAST:  60 mL Omnipaque  300 COMPLICATIONS: None immediate. PROCEDURE: Informed written consent was obtained from the patient after a thorough discussion of the procedural risks, benefits and alternatives. All questions were addressed. Maximal Sterile Barrier Technique was utilized including caps, mask, sterile gowns, sterile  gloves, sterile drape, hand hygiene and skin antiseptic. A timeout was performed prior to the initiation of the procedure. Left upper arm was prepped and draped in sterile fashion. Ultrasound confirmed a patent left upper arm AV graft. Ultrasound image was saved for documentation. Skin was anesthetized 1% lidocaine . Using ultrasound guidance, 21 gauge needle was directed into the graft. Micropuncture dilator set was placed. Left upper extremity shuntogram images were obtained. Micropuncture catheter was removed with manual compression. FINDINGS: Left upper arm AV graft is widely patent. The vascular stent involving the venous anastomosis and outflow vein is widely patent. Central veins are widely patent. Reflux shuntogram images demonstrate normal tapering near the arterial anastomosis with no significant stenosis. Arterial anastomosis is widely patent. IMPRESSION: Left upper arm AV graft is widely patent. No intervention performed. ACCESS: This access remains amenable to future percutaneous interventions as clinically indicated. Electronically Signed   By: Juliene Balder M.D.   On: 07/16/2024 20:53    Labs:  CBC: Recent Labs    07/16/24 0338 07/17/24 0309 07/18/24 0236 07/19/24 0334  WBC 9.8 9.2 7.8 9.2  HGB 7.4* 7.1* 7.3* 7.2*  HCT 22.7* 22.0* 21.8* 22.0*  PLT 217 215 238 227    COAGS: Recent Labs    07/15/24 1012 07/16/24 0338  INR 1.3*  --   APTT  --  30    BMP: Recent Labs    07/16/24 0338 07/17/24 0309 07/18/24 0236 07/19/24 0334  NA 144 146* 141 142  K 4.8 5.1 3.9 4.0  CL 112* 114* 104 106  CO2 11* 12* 24 22  GLUCOSE 93 107* 100* 90  BUN 111* 108* 59* 67*  CALCIUM  6.9* 7.2* 7.6* 7.1*  CREATININE 10.80* 10.70* 6.26* 7.09*  GFRNONAA 4* 4* 8* 7*    LIVER FUNCTION TESTS: Recent Labs    06/10/24 0921 06/12/24 0348 07/15/24 0832 07/16/24 0338 07/17/24 0309 07/18/24 0236 07/19/24 0334  BILITOT 0.5  --  0.5 0.5 0.4  --   --   AST 22  --  110* 57* 62*  --   --   ALT  17  --  100* 80* 69*  --   --   ALKPHOS 53  --  75 67 70  --   --   PROT 6.6  --  6.8 6.4* 6.2*  --   --   ALBUMIN  3.8   < > 3.7 3.2* 3.2* 3.0* 3.1*   < > = values in this interval not displayed.    Assessment and Plan: End stage renal disease, HD dependent Ongoing difficulty with AVF with concern for infilltration.  Patient continues with difficulty cannulating and completing full HD sessions.  Tunneled HD catheter requested by Dr. Jerrye.   Patient is aware and agreeable.  She is NPO.   Risks and benefits discussed with the patient including, but not limited to bleeding, infection, vascular injury, pneumothorax which may require chest tube placement, air embolism or even death  All of the patient's questions were answered, patient is agreeable to proceed. Consent signed and in chart.  Electronically Signed: Johntavius Shepard Sue-Ellen Noor Witte, PA 07/19/2024, 11:16 AM   I spent a total of 15  Minutes at the the patient's bedside AND on the patient's hospital floor or unit, greater than 50% of which was counseling/coordinating care for end stage renal disease.      "

## 2024-07-19 NOTE — Progress Notes (Signed)
 [facility]  Jacqueline [name] FMW:[id number]  DOB: March 04, 1981  DOA: 07/15/2024  PCP: [name], MD  07/19/2024,10:44 AM  LOS: 3 days    Code Status:  Full Code     From:  home    44 year old female ESRD TTS [place]-dialyzing via [facility] Complicated fistula access issues in the past (see note 06/12/2024 Dr. Gretta vascular) Previous severe anemia secondary to vaginal bleed from endometrial polyps-supposed to be on Megace  40 3 times daily (this was discussed with gynecology 06/13/2024 at last admission)--patient was told to follow-up with gynecology as an outpatient OSA not on PTA CPAP  12/29 came to ED secondary to access note at HD for less than an hour last on 12/23--has gained about 15 pounds In ER found to have anemia with hemoglobin 6.1 transfused 2 units PRBC Hemoccult was negative Because of volume overload was given Lasix  X1 and fistulogram was arranged by IR proBNP was 10,300, troponin was 118 magnesium  2.7, phosphorus 8.4, B12 1473, folic acid  5.5-hCG was negative    Pertinent imaging/studies till date  12/30 shuntogram was performed for left upper arm graft-functioning well including stent 1/1 unable to cannulate Access---IR consulted re: [facility]   Assessment  & Plan :    Missed x 2 wk dialysis secondary to AV shunt/fistula malfunction left upper arm Left upper access deep cannulation---essentially mal-functioning---TDC to be placed today per IR.  Will need HD per renal after continue calcitriol  0.5 daily ---PhosLo  1.3 g tid---Aranesp  150 q. Tuesday ---folic acid  1 mg---Mircera 200 mcg every 2 weekly, vitamin D  q. Thursday 50,000 midodrine  5 3 times daily at home which has been held Lasix  per Renal--rec'd several doses to temporize and keep dry  Endometrial polyps causing anemia Continue Megace  40 3 times daily-has outpatient appointment with gynecology later in January If hemoglobin below 7, would transfuse  Lower extremity wounds Heal of both sides has a little bit of breakdown  she sees a podiatrist she can follow-up with him wounds not examined today  OSA not on CPAP Recommend outpatient follow-up  HTN Could challenge dry weight by dropping the dose of Cardizem  CD 120- Lasix  as above per ID  Data Reviewed today: contnues to be anemic-   DVT prophylaxis: None at this time-   Dispo/Global plan: Likely dialyzed back-to-back and discharge tomorrow as per renal     Subjective:   Pleasant NPO for procedure  Objective + exam Vitals:   07/18/24 1933 07/19/24 0500 07/19/24 0544 07/19/24 0836  BP: 128/67  116/66 (!) 143/75  Pulse: 98  91 95  Resp: 18  18 18   Temp: 98.9 F (37.2 C)  98.3 F (36.8 C) 98.3 F (36.8 C)  TempSrc: Oral  Oral Oral  SpO2: 98%  98% 98%  Weight:  80.8 kg    Height:       Filed Weights   07/17/24 1105 07/18/24 1448 07/19/24 0500  Weight: 87.9 kg 80.9 kg 80.8 kg     Examination: EOMI NCAT no focal deficit thick neck Mallampati 2--CTAB no added sound--fistula left upper arm Trace LE edema Neuro intact  Scheduled Meds:  sodium chloride    Intravenous Once   calcitRIOL   0.5 mcg Oral Daily   calcium  acetate  1,334 mg Oral TID WC   darbepoetin (ARANESP ) injection - DIALYSIS  150 mcg Subcutaneous Q Tue-1800   diltiazem   120 mg Oral q morning   folic acid   1 mg Oral Daily   furosemide   80 mg Intravenous Daily   senna-docusate  1  tablet Oral QHS   sodium chloride  flush  3 mL Intravenous Q12H   Continuous Infusions:   acetaminophen  **OR** acetaminophen , hydrALAZINE , HYDROmorphone  (DILAUDID ) injection, ipratropium, ondansetron  **OR** ondansetron  (ZOFRAN ) IV, oxyCODONE , traZODone  Time 44  Jai-Gurmukh Chelcy Bolda, MD  [facility]

## 2024-07-19 NOTE — Plan of Care (Signed)

## 2024-07-20 DIAGNOSIS — N186 End stage renal disease: Secondary | ICD-10-CM | POA: Diagnosis not present

## 2024-07-20 LAB — CBC WITH DIFFERENTIAL/PLATELET
Abs Immature Granulocytes: 0.06 K/uL (ref 0.00–0.07)
Basophils Absolute: 0 K/uL (ref 0.0–0.1)
Basophils Relative: 0 %
Eosinophils Absolute: 0.2 K/uL (ref 0.0–0.5)
Eosinophils Relative: 2 %
HCT: 26.4 % — ABNORMAL LOW (ref 36.0–46.0)
Hemoglobin: 8.4 g/dL — ABNORMAL LOW (ref 12.0–15.0)
Immature Granulocytes: 1 %
Lymphocytes Relative: 13 %
Lymphs Abs: 1.2 K/uL (ref 0.7–4.0)
MCH: 28.9 pg (ref 26.0–34.0)
MCHC: 31.8 g/dL (ref 30.0–36.0)
MCV: 90.7 fL (ref 80.0–100.0)
Monocytes Absolute: 0.8 K/uL (ref 0.1–1.0)
Monocytes Relative: 9 %
Neutro Abs: 6.9 K/uL (ref 1.7–7.7)
Neutrophils Relative %: 75 %
Platelets: 240 K/uL (ref 150–400)
RBC: 2.91 MIL/uL — ABNORMAL LOW (ref 3.87–5.11)
RDW: 14.6 % (ref 11.5–15.5)
WBC: 9.3 K/uL (ref 4.0–10.5)
nRBC: 0.4 % — ABNORMAL HIGH (ref 0.0–0.2)

## 2024-07-20 LAB — RENAL FUNCTION PANEL
Albumin: 3.3 g/dL — ABNORMAL LOW (ref 3.5–5.0)
Anion gap: 11 (ref 5–15)
BUN: 29 mg/dL — ABNORMAL HIGH (ref 6–20)
CO2: 28 mmol/L (ref 22–32)
Calcium: 7.9 mg/dL — ABNORMAL LOW (ref 8.9–10.3)
Chloride: 100 mmol/L (ref 98–111)
Creatinine, Ser: 4.18 mg/dL — ABNORMAL HIGH (ref 0.44–1.00)
GFR, Estimated: 13 mL/min — ABNORMAL LOW
Glucose, Bld: 103 mg/dL — ABNORMAL HIGH (ref 70–99)
Phosphorus: 3.5 mg/dL (ref 2.5–4.6)
Potassium: 3.8 mmol/L (ref 3.5–5.1)
Sodium: 138 mmol/L (ref 135–145)

## 2024-07-20 LAB — GLUCOSE, CAPILLARY: Glucose-Capillary: 96 mg/dL (ref 70–99)

## 2024-07-20 MED ORDER — DILTIAZEM HCL 30 MG PO TABS
30.0000 mg | ORAL_TABLET | Freq: Two times a day (BID) | ORAL | Status: DC
  Administered 2024-07-20 – 2024-07-22 (×4): 30 mg via ORAL
  Filled 2024-07-20 (×6): qty 1

## 2024-07-20 MED ORDER — DILTIAZEM HCL 30 MG PO TABS: 30.0000 mg | ORAL_TABLET | Freq: Two times a day (BID) | ORAL | 0 refills | Status: DC

## 2024-07-20 MED ORDER — HEPARIN SODIUM (PORCINE) 1000 UNIT/ML IJ SOLN
INTRAMUSCULAR | Status: AC
  Filled 2024-07-20: qty 4

## 2024-07-20 MED ORDER — HEPARIN SODIUM (PORCINE) 1000 UNIT/ML IJ SOLN
3200.0000 [IU] | Freq: Once | INTRAMUSCULAR | Status: AC
  Administered 2024-07-20: 3200 [IU]

## 2024-07-20 NOTE — Progress Notes (Signed)
 Received patient in bed to unit.  Alert and oriented.  Informed consent signed and in chart.   TX duration: 3.5  Patient tolerated well.  Transported back to the room  Alert, without acute distress.  Hand-off given to patient's nurse. Shift RN  Access used: Dialysis catheter Access issues: None  Total UF removed: 3000 Medication(s) given: Dilaudid  1 mg IVP Post HD VS: T98.7-HR95-RR14 B/P123/65 Post HD weight: 77.8kg  Neville Seip, RN [facility]   07/20/24 0130  Vitals  Temp 98.7 F (37.1 C)  Temp Source Oral  BP 123/65  MAP (mmHg) 80  BP Location Right Arm  BP Method Automatic  Patient Position (if appropriate) Lying  Pulse Rate 95  Pulse Rate Source Monitor  ECG Heart Rate 95  Resp 14  Weight 77.8 kg  Type of Weight Post-Dialysis  Oxygen Therapy  SpO2 97 %  O2 Device Room Air  Patient Activity (if Appropriate) In bed  Pulse Oximetry Type Continuous  During Treatment Monitoring  Blood Flow Rate (mL/min) 0 mL/min  Arterial Pressure (mmHg) -71.91 mmHg  Venous Pressure (mmHg) 27.27 mmHg  TMP (mmHg) 8.28 mmHg  Ultrafiltration Rate (mL/min) 1092 mL/min  Dialysate Flow Rate (mL/min) 299 ml/min  Dialysate Potassium Concentration 3  Dialysate Calcium  Concentration 2.5  Duration of HD Treatment -hour(s) 3.5 hour(s)  Cumulative Fluid Removed (mL) per Treatment  3000.23  HD Safety Checks Performed Yes  Intra-Hemodialysis Comments Tolerated well;Tx completed  Hemodialysis Catheter Right Internal jugular Double lumen Permanent (Tunneled)  Placement Date/Time: 07/19/24 1150   Serial / Lot #: [id number]  Expiration Date: 12/15/28  Time Out: Correct patient;Correct site;Correct procedure  Maximum sterile barrier precautions: Hand hygiene;Cap;Mask;Sterile gown;Sterile gloves;Large sterile s...  Site Condition No complications  Blue Lumen Status Flushed;Antimicrobial dead end cap;Heparin  locked  Red Lumen Status Flushed;Antimicrobial dead end cap;Heparin  locked   Purple Lumen Status N/A  Catheter fill solution Heparin  1000 units/ml  Catheter fill volume (Arterial) 1.6 cc  Catheter fill volume (Venous) 1.6  Dressing Type Transparent  Dressing Status Antimicrobial disc/dressing in place;Clean, Dry, Intact  Drainage Description None  Post treatment catheter status Capped and Clamped

## 2024-07-20 NOTE — Progress Notes (Signed)
 " [facility] Progress Note   Subjective:   Seen in room - dialyzed overnight with 3L off. Lungs clear on exam, but she endorses still having DOE. No CP.  Objective Vitals:   07/20/24 0130 07/20/24 0215 07/20/24 0516 07/20/24 0746  BP: 123/65 133/65 134/64 125/67  Pulse: 95 96 89 91  Resp: 14  18 16   Temp: 98.7 F (37.1 C)  98.6 F (37 C) 98.5 F (36.9 C)  TempSrc: Oral     SpO2: 97% 95% 96% 97%  Weight: 77.8 kg     Height:       Physical Exam General: Well appearing, NAD. Room air Heart: RRR Lungs: CTAB; no rales Abdomen: soft Extremities: no LE edema Dialysis Access: [facility]  Additional Objective Labs: Basic Metabolic Panel: Recent Labs  Lab 07/18/24 0236 07/19/24 0334 07/20/24 0619  NA 141 142 138  K 3.9 4.0 3.8  CL 104 106 100  CO2 24 22 28   GLUCOSE 100* 90 103*  BUN 59* 67* 29*  CREATININE 6.26* 7.09* 4.18*  CALCIUM  7.6* 7.1* 7.9*  PHOS 5.8* 6.0* 3.5   Liver Function Tests: Recent Labs  Lab 07/15/24 0832 07/16/24 0338 07/17/24 0309 07/18/24 0236 07/19/24 0334 07/20/24 0619  AST 110* 57* 62*  --   --   --   ALT 100* 80* 69*  --   --   --   ALKPHOS 75 67 70  --   --   --   BILITOT 0.5 0.5 0.4  --   --   --   PROT 6.8 6.4* 6.2*  --   --   --   ALBUMIN  3.7 3.2* 3.2* 3.0* 3.1* 3.3*   CBC: Recent Labs  Lab 07/16/24 0338 07/17/24 0309 07/18/24 0236 07/19/24 0334 07/20/24 0619  WBC 9.8 9.2 7.8 9.2 9.3  NEUTROABS  --   --  5.5 5.5 6.9  HGB 7.4* 7.1* 7.3* 7.2* 8.4*  HCT 22.7* 22.0* 21.8* 22.0* 26.4*  MCV 89.4 89.8 87.2 88.7 90.7  PLT 217 215 238 227 240   Studies/Results: IR TUNNELED CENTRAL VENOUS CATH PLC W IMG Result Date: 07/19/2024 INDICATION: 44 year old with end-stage renal disease. Poorly functioning left upper extremity AV graft. EXAM: FLUOROSCOPIC AND ULTRASOUND GUIDED PLACEMENT OF A TUNNELED DIALYSIS CATHETER Physician: Juliene SAUNDERS. Philip, MD MEDICATIONS: Ancef  2 g; The antibiotic was administered within an appropriate time  interval prior to skin puncture. ANESTHESIA/SEDATION: Moderate (conscious) sedation was employed during this procedure. A total of Versed  2 mg and fentanyl  100 mcg was administered intravenously at the order of the provider performing the procedure. Total intra-service moderate sedation time: 21 minutes. Patient's level of consciousness and vital signs were monitored continuously by radiology nurse throughout the procedure under the supervision of the provider performing the procedure. FLUOROSCOPY TIME:  Radiation Exposure Index (as provided by the fluoroscopic device): 2 mGy Kerma COMPLICATIONS: None immediate. PROCEDURE: The procedure was explained to the patient. The risks and benefits of the procedure were discussed and the patient's questions were addressed. Informed consent was obtained from the patient. The patient was placed supine on the interventional table. Ultrasound confirmed a patent right internal jugular vein. Ultrasound image obtained for documentation. The right neck and chest was prepped and draped in a sterile fashion. Maximal barrier sterile technique was utilized including caps, mask, sterile gowns, sterile gloves, sterile drape, hand hygiene and skin antiseptic. The right neck was anesthetized with 1% lidocaine . A small incision was made with #11 blade scalpel. A 21 gauge needle  directed into the right internal jugular vein with ultrasound guidance. A micropuncture dilator set was placed. A 19 cm tip to cuff Palindrome catheter was selected. The skin below the right clavicle was anesthetized and a small incision was made with an #11 blade scalpel. A subcutaneous tunnel was formed to the vein dermatotomy site. The catheter was brought through the tunnel. The vein dermatotomy site was dilated to accommodate a peel-away sheath over a wire. The catheter was placed through the peel-away sheath and directed into the central venous structures. The tip of the catheter was placed at superior cavoatrial  junction with fluoroscopy. Fluoroscopic images were obtained for documentation. Both lumens were found to aspirate and flush well. The proper amount of heparin  was flushed in both lumens. The vein dermatotomy site was closed using a single layer of absorbable suture and Dermabond. Gel-Foam was placed in subcutaneous tract. The catheter was secured to the skin using Prolene suture. IMPRESSION: Successful placement of a right jugular tunneled dialysis catheter using ultrasound and fluoroscopic guidance. Electronically Signed   By: Juliene Balder M.D.   On: 07/19/2024 13:23   Medications:   sodium chloride    Intravenous Once   calcitRIOL   0.5 mcg Oral Daily   calcium  acetate  1,334 mg Oral TID WC   Chlorhexidine  Gluconate Cloth  6 each Topical Daily   darbepoetin (ARANESP ) injection - DIALYSIS  150 mcg Subcutaneous Q Tue-1800   diltiazem   120 mg Oral q morning   folic acid   1 mg Oral Daily   furosemide   80 mg Intravenous Daily   senna-docusate  1 tablet Oral QHS   sodium chloride  flush  10-40 mL Intracatheter Q12H   sodium chloride  flush  3 mL Intravenous Q12H    Dialysis Orders TTS - [facility] 4hr, 500/A1.5, EDW 75kg, 3K/2.5Ca, L AVG Meds: mircera 225 mcg every 2 weeks (which was last given on 12/2); iron 50 mg weekly; hectorol 9 mcg every treatment 3/week  Assessment/Plan: AVG infiltration: Leading to poor dialysis, s/p shutogram which is widely patent. TDC placed 07/19/24 ESRD: TTS schedule. S/p HD last night. Since not 100% to baseline, will plan another HD today - can discharge after HTN/volume: BP good, she still feels volume up. Another HD planned today. Prev on mido - now on hold. Anemia of ESRD: S/p 1U PRBCs on 12/29. Hgb 8.4 - on Aranesp  q Tues here, continue ESA as outatient. Secondary HPTH: CorrCa/ Phos ok - contine home meds. Nutrition: Alb low, encouarged ^ protein diet Elevated LFTs: Trending down.   Izetta Boehringer, PA-C 07/20/2024, 11:46 AM  [facility]    "

## 2024-07-20 NOTE — Plan of Care (Signed)
 " Problem: Health Behavior/Discharge Planning: Goal: Ability to manage health-related needs will improve 07/20/2024 1843 by Franchot Rosaria LABOR, RN Outcome: Progressing 07/20/2024 1843 by Franchot Rosaria LABOR, RN Outcome: Progressing   Problem: Clinical Measurements: Goal: Ability to maintain clinical measurements within normal limits will improve 07/20/2024 1843 by Franchot Rosaria LABOR, RN Outcome: Progressing 07/20/2024 1843 by Franchot Rosaria LABOR, RN Outcome: Progressing Goal: Will remain free from infection 07/20/2024 1843 by Franchot Rosaria LABOR, RN Outcome: Progressing 07/20/2024 1843 by Franchot Rosaria LABOR, RN Outcome: Progressing Goal: Diagnostic test results will improve 07/20/2024 1843 by Franchot Rosaria LABOR, RN Outcome: Progressing 07/20/2024 1843 by Franchot Rosaria LABOR, RN Outcome: Progressing Goal: Respiratory complications will improve 07/20/2024 1843 by Franchot Rosaria LABOR, RN Outcome: Progressing 07/20/2024 1843 by Franchot Rosaria LABOR, RN Outcome: Progressing Goal: Cardiovascular complication will be avoided 07/20/2024 1843 by Franchot Rosaria LABOR, RN Outcome: Progressing 07/20/2024 1843 by Franchot Rosaria LABOR, RN Outcome: Progressing   Problem: Activity: Goal: Risk for activity intolerance will decrease 07/20/2024 1843 by Franchot Rosaria LABOR, RN Outcome: Progressing 07/20/2024 1843 by Franchot Rosaria LABOR, RN Outcome: Progressing   Problem: Nutrition: Goal: Adequate nutrition will be maintained 07/20/2024 1843 by Franchot Rosaria LABOR, RN Outcome: Progressing 07/20/2024 1843 by Franchot Rosaria LABOR, RN Outcome: Progressing   Problem: Coping: Goal: Level of anxiety will decrease 07/20/2024 1843 by Franchot Rosaria LABOR, RN Outcome: Progressing 07/20/2024 1843 by Franchot Rosaria LABOR, RN Outcome: Progressing   Problem: Elimination: Goal: Will not experience complications related to bowel motility 07/20/2024 1843 by Franchot Rosaria LABOR, RN Outcome: Progressing 07/20/2024 1843 by Franchot Rosaria LABOR, RN Outcome: Progressing Goal: Will not experience complications related to urinary retention 07/20/2024 1843 by Franchot Rosaria LABOR, RN Outcome: Progressing 07/20/2024 1843 by Franchot Rosaria LABOR, RN Outcome: Progressing   Problem: Pain Managment: Goal: General experience of comfort will improve and/or be controlled 07/20/2024 1843 by Franchot Rosaria LABOR, RN Outcome: Progressing 07/20/2024 1843 by Franchot Rosaria LABOR, RN Outcome: Progressing   Problem: Safety: Goal: Ability to remain free from injury will improve 07/20/2024 1843 by Franchot Rosaria LABOR, RN Outcome: Progressing 07/20/2024 1843 by Franchot Rosaria LABOR, RN Outcome: Progressing   Problem: Skin Integrity: Goal: Risk for impaired skin integrity will decrease 07/20/2024 1843 by Franchot Rosaria LABOR, RN Outcome: Progressing 07/20/2024 1843 by Franchot Rosaria LABOR, RN Outcome: Progressing   Problem: Education: Goal: Knowledge of General Education information will improve Description: Including pain rating scale, medication(s)/side effects and non-pharmacologic comfort measures 07/20/2024 1843 by Franchot Rosaria LABOR, RN Outcome: Progressing 07/20/2024 1843 by Franchot Rosaria LABOR, RN Outcome: Progressing   Problem: Health Behavior/Discharge Planning: Goal: Ability to manage health-related needs will improve 07/20/2024 1843 by Franchot Rosaria LABOR, RN Outcome: Progressing 07/20/2024 1843 by Franchot Rosaria LABOR, RN Outcome: Progressing   Problem: Clinical Measurements: Goal: Ability to maintain clinical measurements within normal limits will improve 07/20/2024 1843 by Franchot Rosaria LABOR, RN Outcome: Progressing 07/20/2024 1843 by Franchot Rosaria LABOR, RN Outcome: Progressing Goal: Will remain free from infection 07/20/2024 1843 by Franchot Rosaria LABOR, RN Outcome: Progressing 07/20/2024 1843 by Franchot Rosaria LABOR, RN Outcome: Progressing Goal: Diagnostic test results will improve 07/20/2024 1843 by Franchot Rosaria LABOR, RN Outcome:  Progressing 07/20/2024 1843 by Franchot Rosaria LABOR, RN Outcome: Progressing Goal: Respiratory complications will improve 07/20/2024 1843 by Franchot Rosaria LABOR, RN Outcome: Progressing 07/20/2024 1843 by Franchot Rosaria LABOR, RN Outcome: Progressing Goal: Cardiovascular complication will be avoided 07/20/2024 1843 by Franchot Rosaria LABOR, RN Outcome: Progressing 07/20/2024 1843 by Franchot Rosaria LABOR, RN  Outcome: Progressing   Problem: Activity: Goal: Risk for activity intolerance will decrease 07/20/2024 1843 by Franchot Rosaria LABOR, RN Outcome: Progressing 07/20/2024 1843 by Franchot Rosaria LABOR, RN Outcome: Progressing   Problem: Nutrition: Goal: Adequate nutrition will be maintained 07/20/2024 1843 by Franchot Rosaria LABOR, RN Outcome: Progressing 07/20/2024 1843 by Franchot Rosaria LABOR, RN Outcome: Progressing   Problem: Coping: Goal: Level of anxiety will decrease 07/20/2024 1843 by Franchot Rosaria LABOR, RN Outcome: Progressing 07/20/2024 1843 by Franchot Rosaria LABOR, RN Outcome: Progressing   Problem: Elimination: Goal: Will not experience complications related to bowel motility 07/20/2024 1843 by Franchot Rosaria LABOR, RN Outcome: Progressing 07/20/2024 1843 by Franchot Rosaria LABOR, RN Outcome: Progressing Goal: Will not experience complications related to urinary retention 07/20/2024 1843 by Franchot Rosaria LABOR, RN Outcome: Progressing 07/20/2024 1843 by Franchot Rosaria LABOR, RN Outcome: Progressing   Problem: Pain Managment: Goal: General experience of comfort will improve and/or be controlled 07/20/2024 1843 by Franchot Rosaria LABOR, RN Outcome: Progressing 07/20/2024 1843 by Franchot Rosaria LABOR, RN Outcome: Progressing   Problem: Safety: Goal: Ability to remain free from injury will improve 07/20/2024 1843 by Franchot Rosaria LABOR, RN Outcome: Progressing 07/20/2024 1843 by Franchot Rosaria LABOR, RN Outcome: Progressing   Problem: Skin Integrity: Goal: Risk for impaired skin integrity will  decrease 07/20/2024 1843 by Franchot Rosaria LABOR, RN Outcome: Progressing 07/20/2024 1843 by Franchot Rosaria LABOR, RN Outcome: Progressing   "

## 2024-07-20 NOTE — Progress Notes (Signed)
 [facility]  Jacqueline [name] FMW:[id number]  DOB: 07/02/1981  DOA: 07/15/2024  PCP: Jacqueline Ezekiel NOVAK, MD  07/20/2024,12:06 PM  LOS: 4 days    Code Status:  Full Code     From:  home    44 year old female ESRD TTS [place]-dialyzing via [facility] Complicated fistula access issues in the past (see note 06/12/2024 Dr. Gretta vascular) Previous severe anemia secondary to vaginal bleed from endometrial polyps-supposed to be on Megace  40 3 times daily (this was discussed with gynecology 06/13/2024 at last admission)--patient was told to follow-up with gynecology as an outpatient OSA not on PTA CPAP  12/29 came to ED secondary to access note at HD for less than an hour last on 12/23--has gained about 15 pounds In ER found to have anemia with hemoglobin 6.1 transfused 2 units PRBC Hemoccult was negative Because of volume overload was given Lasix  X1 and fistulogram was arranged by IR proBNP was 10,300, troponin was 118 magnesium  2.7, phosphorus 8.4, B12 1473, folic acid  5.5-hCG was negative    Pertinent imaging/studies till date  12/30 shuntogram was performed for left upper arm graft-functioning well including stent 1/1 unable to cannulate Access---IR consulted re: [facility]   Assessment  & Plan :    Missed x 2 wk dialysis secondary to AV shunt/fistula malfunction left upper arm Left upper access deep cannulation---essentially mal-functioning---TDC placed 1/2 continue calcitriol  0.5 daily ---PhosLo  1.3 g tid---Aranesp  150 q. Tuesday ---folic acid  1 mg---Mircera 200 mcg every 2 weekly, vitamin D  q. Thursday 50,000 midodrine  5 3 times daily at home which has been held Will get 1 more dialysis today and likely plan for discharge in the next several days-TDC site is paining her and she is not comfortable today  Nausea vomiting Etiology not completely clear-says that this happens after HD occasionally-wonder if we are challenging EDW to aggressively?--- I have cut back Cardizem  to 30 twice daily from the 120 XL  formulation today to see if this helps Discussed with Dr. Vassie who is aware  Endometrial polyps causing anemia Continue Megace  40 3 times daily-has outpatient appointment with gynecology later in January If hemoglobin below 7, would transfuse-seems to have stabilized some  Lower extremity wounds Heal of both sides has a little bit of breakdown she sees a podiatrist she can follow-up with him wounds not examined   OSA not on CPAP Recommend outpatient follow-up  HTN Cardizem  changed to 30 twice daily  Data Reviewed today: contnues to be anemic-   DVT prophylaxis: None at this time-   Dispo/Global plan: Likely dialyzing 1-2 more times and then discharge    Subjective:   Nausea last night-told practitioner that she was short of breath walking around no chest pain  Objective + exam Vitals:   07/20/24 0130 07/20/24 0215 07/20/24 0516 07/20/24 0746  BP: 123/65 133/65 134/64 125/67  Pulse: 95 96 89 91  Resp: 14  18 16   Temp: 98.7 F (37.1 C)  98.6 F (37 C) 98.5 F (36.9 C)  TempSrc: Oral     SpO2: 97% 95% 96% 97%  Weight: 77.8 kg     Height:       Filed Weights   07/19/24 0500 07/19/24 2115 07/20/24 0130  Weight: 80.8 kg 80.8 kg 77.8 kg     Examination: EOMI NCAT no focal deficit thick neck Mallampati 2--CTAB no added sound--fistula left upper arm seems healed New TDC in right chest Trace LE edema Chest is clear Neuro intact  Scheduled Meds:  sodium chloride    Intravenous  Once   calcitRIOL   0.5 mcg Oral Daily   calcium  acetate  1,334 mg Oral TID WC   Chlorhexidine  Gluconate Cloth  6 each Topical Daily   darbepoetin (ARANESP ) injection - DIALYSIS  150 mcg Subcutaneous Q Tue-1800   diltiazem   120 mg Oral q morning   folic acid   1 mg Oral Daily   furosemide   80 mg Intravenous Daily   senna-docusate  1 tablet Oral QHS   sodium chloride  flush  10-40 mL Intracatheter Q12H   sodium chloride  flush  3 mL Intravenous Q12H   Continuous  Infusions:   acetaminophen  **OR** acetaminophen , hydrALAZINE , HYDROmorphone  (DILAUDID ) injection, ipratropium, ondansetron  **OR** ondansetron  (ZOFRAN ) IV, oxyCODONE , sodium chloride  flush, traZODone   Time 44  Colen Grimes, MD  [facility]

## 2024-07-21 ENCOUNTER — Other Ambulatory Visit: Payer: Self-pay | Admitting: Family Medicine

## 2024-07-21 DIAGNOSIS — N186 End stage renal disease: Secondary | ICD-10-CM | POA: Diagnosis not present

## 2024-07-21 LAB — COMPREHENSIVE METABOLIC PANEL WITH GFR
ALT: 23 U/L (ref 0–44)
AST: 38 U/L (ref 15–41)
Albumin: 3.5 g/dL (ref 3.5–5.0)
Alkaline Phosphatase: 77 U/L (ref 38–126)
Anion gap: 13 (ref 5–15)
BUN: 28 mg/dL — ABNORMAL HIGH (ref 6–20)
CO2: 27 mmol/L (ref 22–32)
Calcium: 8.5 mg/dL — ABNORMAL LOW (ref 8.9–10.3)
Chloride: 97 mmol/L — ABNORMAL LOW (ref 98–111)
Creatinine, Ser: 4.65 mg/dL — ABNORMAL HIGH (ref 0.44–1.00)
GFR, Estimated: 11 mL/min — ABNORMAL LOW
Glucose, Bld: 203 mg/dL — ABNORMAL HIGH (ref 70–99)
Potassium: 4 mmol/L (ref 3.5–5.1)
Sodium: 136 mmol/L (ref 135–145)
Total Bilirubin: 0.4 mg/dL (ref 0.0–1.2)
Total Protein: 7.1 g/dL (ref 6.5–8.1)

## 2024-07-21 LAB — RENAL FUNCTION PANEL
Albumin: 3.1 g/dL — ABNORMAL LOW (ref 3.5–5.0)
Anion gap: 10 (ref 5–15)
BUN: 23 mg/dL — ABNORMAL HIGH (ref 6–20)
CO2: 28 mmol/L (ref 22–32)
Calcium: 7.9 mg/dL — ABNORMAL LOW (ref 8.9–10.3)
Chloride: 100 mmol/L (ref 98–111)
Creatinine, Ser: 3.96 mg/dL — ABNORMAL HIGH (ref 0.44–1.00)
GFR, Estimated: 14 mL/min — ABNORMAL LOW
Glucose, Bld: 95 mg/dL (ref 70–99)
Phosphorus: 4 mg/dL (ref 2.5–4.6)
Potassium: 4 mmol/L (ref 3.5–5.1)
Sodium: 138 mmol/L (ref 135–145)

## 2024-07-21 LAB — LIPASE, BLOOD: Lipase: 54 U/L — ABNORMAL HIGH (ref 11–51)

## 2024-07-21 MED ORDER — PANTOPRAZOLE SODIUM 40 MG PO TBEC
40.0000 mg | DELAYED_RELEASE_TABLET | Freq: Every day | ORAL | Status: DC
  Administered 2024-07-21 – 2024-07-22 (×2): 40 mg via ORAL
  Filled 2024-07-21 (×2): qty 1

## 2024-07-21 MED ORDER — METOCLOPRAMIDE HCL 5 MG/ML IJ SOLN
5.0000 mg | Freq: Four times a day (QID) | INTRAMUSCULAR | Status: DC | PRN
  Administered 2024-07-21 (×2): 5 mg via INTRAVENOUS
  Filled 2024-07-21 (×2): qty 2

## 2024-07-21 NOTE — Progress Notes (Unsigned)
 {  Select_TRH_Note:[id number]}

## 2024-07-21 NOTE — Progress Notes (Signed)
 Pt  N/V emesis x2 today s/p antiemetic admin. Some new co abd cramping and diarrhea (loose stool reported x3). Dr. Royal updated, new orders placed for lab work. Phleb team called to confirm stat collection of labs. Will continue to monitor.

## 2024-07-21 NOTE — Progress Notes (Signed)
 " [facility] Progress Note   Subjective:   Seen in room - s/p another HD last night with 2.5L off. Breathing improved. Still some TDC soreness. Nausea last night and this AM - no fever, abd pain.  Objective Vitals:   07/20/24 1808 07/20/24 1912 07/21/24 0442 07/21/24 0804  BP: (!) 145/74 139/69 111/72 117/65  Pulse: (!) 102 95 93 91  Resp: 18 18 19 18   Temp:  98.8 F (37.1 C) 98.9 F (37.2 C) 98.4 F (36.9 C)  TempSrc:  Oral Oral   SpO2: 100% 98% 100% 96%  Weight:      Height:       Physical Exam General: Well appearing, NAD. Room air Heart: RRR Lungs: CTAB; no rales Abdomen: soft Extremities: no LE edema Dialysis Access: [facility]  Additional Objective Labs: Basic Metabolic Panel: Recent Labs  Lab 07/19/24 0334 07/20/24 0619 07/21/24 0543  NA 142 138 138  K 4.0 3.8 4.0  CL 106 100 100  CO2 22 28 28   GLUCOSE 90 103* 95  BUN 67* 29* 23*  CREATININE 7.09* 4.18* 3.96*  CALCIUM  7.1* 7.9* 7.9*  PHOS 6.0* 3.5 4.0   Liver Function Tests: Recent Labs  Lab 07/15/24 0832 07/16/24 0338 07/17/24 0309 07/18/24 0236 07/19/24 0334 07/20/24 0619 07/21/24 0543  AST 110* 57* 62*  --   --   --   --   ALT 100* 80* 69*  --   --   --   --   ALKPHOS 75 67 70  --   --   --   --   BILITOT 0.5 0.5 0.4  --   --   --   --   PROT 6.8 6.4* 6.2*  --   --   --   --   ALBUMIN  3.7 3.2* 3.2*   < > 3.1* 3.3* 3.1*   < > = values in this interval not displayed.   CBC: Recent Labs  Lab 07/16/24 0338 07/17/24 0309 07/18/24 0236 07/19/24 0334 07/20/24 0619  WBC 9.8 9.2 7.8 9.2 9.3  NEUTROABS  --   --  5.5 5.5 6.9  HGB 7.4* 7.1* 7.3* 7.2* 8.4*  HCT 22.7* 22.0* 21.8* 22.0* 26.4*  MCV 89.4 89.8 87.2 88.7 90.7  PLT 217 215 238 227 240   Medications:   sodium chloride    Intravenous Once   calcitRIOL   0.5 mcg Oral Daily   calcium  acetate  1,334 mg Oral TID WC   Chlorhexidine  Gluconate Cloth  6 each Topical Daily   darbepoetin (ARANESP ) injection - DIALYSIS  150 mcg  Subcutaneous Q Tue-1800   diltiazem   30 mg Oral Q12H   folic acid   1 mg Oral Daily   furosemide   80 mg Intravenous Daily   pantoprazole   40 mg Oral Daily   senna-docusate  1 tablet Oral QHS   sodium chloride  flush  10-40 mL Intracatheter Q12H   sodium chloride  flush  3 mL Intravenous Q12H    Dialysis Orders TTS - [facility] 4hr, 500/A1.5, EDW 75kg, 3K/2.5Ca, L AVG Meds: mircera 225 mcg every 2 weeks (which was last given on 12/2); iron 50 mg weekly; hectorol 9 mcg every treatment 3/week   Assessment/Plan: AVG infiltration: Leading to poor dialysis, s/p shutogram which is widely patent. TDC placed 07/19/24 ESRD: TTS schedule. S/p HD l1/2, 1/3. Back to dry weight. Next HD 1/6, likely as outpatient. HTN/volume: BP good, now back to dry weight. No LE edema. Anemia of ESRD: S/p 1U PRBCs on 12/29.  Hgb 8.4 - on Aranesp  q Tues here, continue ESA as outatient. Secondary HPTH: CorrCa/ Phos ok - contine home meds. Nutrition: Alb low, encouarged ^ protein diet Elevated LFTs: Trending down. Dispo: Ok from renal standpoint once medically cleared.   Izetta Boehringer, PA-C 07/21/2024, 10:23 AM  [facility]    "

## 2024-07-21 NOTE — Progress Notes (Signed)
 [facility]  Jacqueline [name] FMW:[id number]  DOB: Aug 27, 1980  DOA: 07/15/2024  PCP: [name], MD  07/21/2024,9:55 AM  LOS: 5 days    Code Status:  Full Code     From:  home    44 year old female ESRD TTS [place]-dialyzing via [facility] Complicated fistula access issues in the past (see note 06/12/2024 Dr. Gretta vascular) Previous severe anemia secondary to vaginal bleed from endometrial polyps-supposed to be on Megace  40 3 times daily (this was discussed with gynecology 06/13/2024 at last admission)--patient was told to follow-up with gynecology as an outpatient OSA not on PTA CPAP  12/29 came to ED secondary to access note at HD for less than an hour last on 12/23--has gained about 15 pounds In ER found to have anemia with hemoglobin 6.1 transfused 2 units PRBC Hemoccult was negative Because of volume overload was given Lasix  X1 and fistulogram was arranged by IR proBNP was 10,300, troponin was 118 magnesium  2.7, phosphorus 8.4, B12 1473, folic acid  5.5-hCG was negative    Pertinent imaging/studies till date  12/30 shuntogram was performed for left upper arm graft-functioning well including stent 1/1 unable to cannulate Access---IR consulted re: [facility]   Assessment  & Plan :    Missed x 2 wk dialysis secondary to AV shunt/fistula malfunction left upper arm Left upper access deep cannulation---essentially mal-functioning---TDC placed 1/2 continue calcitriol  0.5 daily ---PhosLo  1.3 g tid---Aranesp  150 q. Tuesday ---folic acid  1 mg---Mircera 200 mcg every 2 weekly, vitamin D  q. Thursday 50,000 midodrine  5 3 times daily at home --has been held  Nausea vomiting Etiology not completely clear-he is on Megace  for endometrial polyps which actually sometimes helps with nausea vomiting so this is probably not because States nausea happens after HD occasionally--not sure if this was related to hypotension around dialysis Patient vomiting everything that she has eaten since hospitalization Start  Protonix  40 based on renal function If no improvement barium swallow ordered  Endometrial polyps causing anemia Continue Megace  40 3 times daily-has outpatient appointment with gynecology later in January If hemoglobin below 7, would transfuse-seems to have stabilized some  Lower extremity wounds Heal of both sides has a little bit of breakdown she sees a podiatrist she can follow-up with him wounds not examined   OSA not on CPAP Recommend outpatient follow-up  HTN Cardizem  changed to 30 twice daily to see if hypotension causing nausea-does not seem to be the case-May need to go back to 120 Cardizem  CD  Data Reviewed today:  BUN/creatinine 23/3.9 albumin  3.1 No CBC today-Will check again tomorrow  DVT prophylaxis: None at this time-   Dispo/Global plan: Await resolution nausea vomiting    Subjective:   Nausea persisting not able to eat since hospitalization  some discomfort in chest at the [facility] No chest pain no fever no chills   Objective + exam Vitals:   07/20/24 1808 07/20/24 1912 07/21/24 0442 07/21/24 0804  BP: (!) 145/74 139/69 111/72 117/65  Pulse: (!) 102 95 93 91  Resp: 18 18 19 18   Temp:  98.8 F (37.1 C) 98.9 F (37.2 C) 98.4 F (36.9 C)  TempSrc:  Oral Oral   SpO2: 100% 98% 100% 96%  Weight:      Height:       Filed Weights   07/20/24 0130 07/20/24 1512 07/20/24 1722  Weight: 77.8 kg 77.7 kg 75.2 kg     Examination: EOMI NCAT no focal deficit thick neck Mallampati 2--CTAB no added sound--fistula left upper arm seems healed New  TDC in right chest Trace LE edema Abdomen is soft no rebound no guarding but maybe some mild epigastric tenderness-abdomen is patulous   Scheduled Meds:  sodium chloride    Intravenous Once   calcitRIOL   0.5 mcg Oral Daily   calcium  acetate  1,334 mg Oral TID WC   Chlorhexidine  Gluconate Cloth  6 each Topical Daily   darbepoetin (ARANESP ) injection - DIALYSIS  150 mcg Subcutaneous Q Tue-1800   diltiazem   30 mg Oral Q12H    folic acid   1 mg Oral Daily   furosemide   80 mg Intravenous Daily   pantoprazole   40 mg Oral Daily   senna-docusate  1 tablet Oral QHS   sodium chloride  flush  10-40 mL Intracatheter Q12H   sodium chloride  flush  3 mL Intravenous Q12H   Continuous Infusions:   acetaminophen  **OR** acetaminophen , hydrALAZINE , HYDROmorphone  (DILAUDID ) injection, ipratropium, ondansetron  **OR** ondansetron  (ZOFRAN ) IV, oxyCODONE , sodium chloride  flush, traZODone   Time 44  Jacqueline Jomayra Novitsky, MD  [facility]

## 2024-07-21 NOTE — Plan of Care (Signed)

## 2024-07-21 NOTE — Plan of Care (Signed)
  Problem: Health Behavior/Discharge Planning: Goal: Ability to manage health-related needs will improve Outcome: Progressing   Problem: Clinical Measurements: Goal: Ability to maintain clinical measurements within normal limits will improve Outcome: Progressing Goal: Will remain free from infection Outcome: Progressing Goal: Diagnostic test results will improve Outcome: Progressing Goal: Respiratory complications will improve Outcome: Progressing Goal: Cardiovascular complication will be avoided Outcome: Progressing   Problem: Activity: Goal: Risk for activity intolerance will decrease Outcome: Progressing   Problem: Nutrition: Goal: Adequate nutrition will be maintained Outcome: Progressing   Problem: Coping: Goal: Level of anxiety will decrease Outcome: Progressing   Problem: Elimination: Goal: Will not experience complications related to bowel motility Outcome: Progressing Goal: Will not experience complications related to urinary retention Outcome: Progressing   Problem: Pain Managment: Goal: General experience of comfort will improve and/or be controlled Outcome: Progressing   Problem: Safety: Goal: Ability to remain free from injury will improve Outcome: Progressing   Problem: Skin Integrity: Goal: Risk for impaired skin integrity will decrease Outcome: Progressing   Problem: Education: Goal: Knowledge of General Education information will improve Description: Including pain rating scale, medication(s)/side effects and non-pharmacologic comfort measures Outcome: Progressing   Problem: Health Behavior/Discharge Planning: Goal: Ability to manage health-related needs will improve Outcome: Progressing   Problem: Clinical Measurements: Goal: Ability to maintain clinical measurements within normal limits will improve Outcome: Progressing Goal: Will remain free from infection Outcome: Progressing Goal: Diagnostic test results will improve Outcome:  Progressing Goal: Respiratory complications will improve Outcome: Progressing Goal: Cardiovascular complication will be avoided Outcome: Progressing   Problem: Activity: Goal: Risk for activity intolerance will decrease Outcome: Progressing   Problem: Nutrition: Goal: Adequate nutrition will be maintained Outcome: Progressing   Problem: Coping: Goal: Level of anxiety will decrease Outcome: Progressing   Problem: Elimination: Goal: Will not experience complications related to bowel motility Outcome: Progressing Goal: Will not experience complications related to urinary retention Outcome: Progressing   Problem: Pain Managment: Goal: General experience of comfort will improve and/or be controlled Outcome: Progressing   Problem: Safety: Goal: Ability to remain free from injury will improve Outcome: Progressing   Problem: Skin Integrity: Goal: Risk for impaired skin integrity will decrease Outcome: Progressing

## 2024-07-22 ENCOUNTER — Inpatient Hospital Stay (HOSPITAL_COMMUNITY)

## 2024-07-22 LAB — CBC WITH DIFFERENTIAL/PLATELET
Abs Immature Granulocytes: 0.04 K/uL (ref 0.00–0.07)
Basophils Absolute: 0.1 K/uL (ref 0.0–0.1)
Basophils Relative: 1 %
Eosinophils Absolute: 0.4 K/uL (ref 0.0–0.5)
Eosinophils Relative: 5 %
HCT: 26.2 % — ABNORMAL LOW (ref 36.0–46.0)
Hemoglobin: 8.4 g/dL — ABNORMAL LOW (ref 12.0–15.0)
Immature Granulocytes: 0 %
Lymphocytes Relative: 18 %
Lymphs Abs: 1.7 K/uL (ref 0.7–4.0)
MCH: 29.3 pg (ref 26.0–34.0)
MCHC: 32.1 g/dL (ref 30.0–36.0)
MCV: 91.3 fL (ref 80.0–100.0)
Monocytes Absolute: 1 K/uL (ref 0.1–1.0)
Monocytes Relative: 11 %
Neutro Abs: 6.3 K/uL (ref 1.7–7.7)
Neutrophils Relative %: 65 %
Platelets: 315 K/uL (ref 150–400)
RBC: 2.87 MIL/uL — ABNORMAL LOW (ref 3.87–5.11)
RDW: 14.6 % (ref 11.5–15.5)
WBC: 9.6 K/uL (ref 4.0–10.5)
nRBC: 0.2 % (ref 0.0–0.2)

## 2024-07-22 LAB — RENAL FUNCTION PANEL
Albumin: 3.3 g/dL — ABNORMAL LOW (ref 3.5–5.0)
Anion gap: 10 (ref 5–15)
BUN: 34 mg/dL — ABNORMAL HIGH (ref 6–20)
CO2: 28 mmol/L (ref 22–32)
Calcium: 8.1 mg/dL — ABNORMAL LOW (ref 8.9–10.3)
Chloride: 100 mmol/L (ref 98–111)
Creatinine, Ser: 5.62 mg/dL — ABNORMAL HIGH (ref 0.44–1.00)
GFR, Estimated: 9 mL/min — ABNORMAL LOW
Glucose, Bld: 128 mg/dL — ABNORMAL HIGH (ref 70–99)
Phosphorus: 4.9 mg/dL — ABNORMAL HIGH (ref 2.5–4.6)
Potassium: 4.1 mmol/L (ref 3.5–5.1)
Sodium: 138 mmol/L (ref 135–145)

## 2024-07-22 LAB — GLUCOSE, CAPILLARY: Glucose-Capillary: 135 mg/dL — ABNORMAL HIGH (ref 70–99)

## 2024-07-22 MED ORDER — PANTOPRAZOLE SODIUM 40 MG PO TBEC: 40.0000 mg | DELAYED_RELEASE_TABLET | Freq: Every day | ORAL | 0 refills | Status: AC

## 2024-07-22 MED ORDER — METOCLOPRAMIDE HCL 5 MG PO TABS
10.0000 mg | ORAL_TABLET | Freq: Three times a day (TID) | ORAL | Status: DC
  Administered 2024-07-22: 10 mg via ORAL
  Filled 2024-07-22: qty 2

## 2024-07-22 MED ORDER — METOCLOPRAMIDE HCL 10 MG PO TABS: 10.0000 mg | ORAL_TABLET | Freq: Three times a day (TID) | ORAL | 1 refills | Status: AC

## 2024-07-22 NOTE — Discharge Summary (Signed)
 Physician Discharge Summary  Jacqueline [name] FMW:[id number] DOB: September 26, 1980 DOA: 07/15/2024  PCP: Jacqueline Ezekiel NOVAK, MD  Admit date: 07/15/2024 Discharge date: 07/22/2024  Time spent: 45 minutes  Recommendations for Outpatient Follow-up:  Requires outpatient CBC Chem-12 and follow-up with GI if continues to have further issues with nausea vomiting Shunt malfunction etc. needs to be worked up in the outpatient with vascular TDC placed and will dialyze through this for the time being Needs outpatient Gyn follow-up as is on Megace  and this should not be used chronically Resume Cardizem  120 CD at discharge and challenge EDW as an outpatient  Discharge Diagnoses:  MAIN problem for hospitalization   Missed dialysis secondary to AV fistula malfunction Dysphagia  Please see below for itemized issues addressed in HOpsital- refer to other progress notes for clarity if needed  Discharge Condition: Improved  Diet recommendation: Renal  Filed Weights   07/20/24 0130 07/20/24 1512 07/20/24 1722  Weight: 77.8 kg 77.7 kg 75.2 kg    History of present illness:  44 year old female ESRD TTS [name]-dialyzing via [facility] Complicated fistula access issues in the past (see note 06/12/2024 Dr. Gretta vascular) Previous severe anemia secondary to vaginal bleed from endometrial polyps-supposed to be on Megace  40 3 times daily (this was discussed with gynecology 06/13/2024 at last admission)--patient was told to follow-up with gynecology as an outpatient OSA not on PTA CPAP   12/29 came to ED secondary to access note at HD for less than an hour last on 12/23--has gained about 15 pounds In ER found to have anemia with hemoglobin 6.1 transfused 2 units PRBC Hemoccult was negative Because of volume overload was given Lasix  X1 and fistulogram was arranged by IR proBNP was 10,300, troponin was 118 magnesium  2.7, phosphorus 8.4, B12 1473, folic acid  5.5-hCG was negative      Pertinent imaging/studies  till date  12/30 shuntogram was performed for left upper arm graft-functioning well including stent 1/1 unable to cannulate Access---IR consulted re: TDC 1/5 DG esophagius showed [verbal report] that tablet got stuck    Assessment  & Plan :      Missed x 2 wk dialysis secondary to AV shunt/fistula malfunction left upper arm Left upper access deep cannulation---essentially mal-functioning---TDC placed 1/2 yes and is able to dialyze via it Continue home Renal meds as below midodrine  5 3 times daily at home --has been held athis admit   Nausea vomiting Secondary to dysphagia from probable disordered peristalsis Protonix  40 added this hospital stay Barium swallow verbal report from the patient showed that she had some stasis but after about a minute of sitting up the stasis self resolved---She will need close follow-upEndometrial polyps causing anemia Continue Megace  40 3 times daily-has outpatient appointment with gynecology later in January If hemoglobin below 7, would transfuse-seems to have stabilized some   Lower extremity wounds Heal of both sides has a little bit of breakdown she sees a podiatrist she can follow-up with him wounds not examined    OSA not on CPAP Recommend outpatient follow-up   HTN Cardizem  changed to 30 twice daily to see if hypotension --it was not hypotension causing the nausea so we went back  to 120 Cardizem  CD  Discharge Exam: Vitals:   07/22/24 0405 07/22/24 0757  BP: (!) 91/49 135/73  Pulse: 96 100  Resp: 19 19  Temp: 98.7 F (37.1 C) 98.2 F (36.8 C)  SpO2: 100% 100%    Subj on day of d/c   Awake coherent no distress  General  Exam on discharge  EOMI NCAT no focal deficit no icterus no pallor no wheeze no rales CTAB no added sound S1-S2 no murmur no rub no gallop Abdomen soft no rebound TDC intact looks reasonable Wound not examined  Discharge Instructions   Discharge Instructions     Discharge instructions   Complete by: As  directed    Please take your medications as indicated please take all of them-we will try and dialyze you today but if on the schedule you are unable to get dialyzed we will give you another high dose of Lasix  IV and send you home but you need to follow-up closely with your home dialysis center on Saturday and get fluid pulled off Continue  renal restrictions do not drink too much do not eat too much salt Best of luck   Increase activity slowly   Complete by: As directed    Increase activity slowly   Complete by: As directed       Allergies as of 07/22/2024   No Known Allergies      Medication List     STOP taking these medications    midodrine  5 MG tablet Commonly known as: PROAMATINE        TAKE these medications    acetaminophen  500 MG tablet Commonly known as: TYLENOL  Take 1,000 mg by mouth 2 (two) times daily as needed for moderate pain (pain score 4-6), fever or headache.   calcitRIOL  0.5 MCG capsule Commonly known as: ROCALTROL  Take 0.5 mcg by mouth daily. Prior to dialysis on Tuesday, Thursday, Saturdays only   calcium  acetate 667 MG capsule Commonly known as: PHOSLO  Take 1,334 mg by mouth 3 (three) times daily with meals.   diltiazem  120 MG 24 hr capsule Commonly known as: CARDIZEM  CD Take 1 capsule (120 mg total) by mouth every morning.   furosemide  40 MG tablet Commonly known as: LASIX  Take 80 mg by mouth daily.   megestrol  40 MG tablet Commonly known as: MEGACE  Take 1 tablet (40 mg total) by mouth 3 (three) times daily.   metoCLOPramide  10 MG tablet Commonly known as: REGLAN  Take 1 tablet (10 mg total) by mouth 3 (three) times daily with meals.   MIRCERA IJ 200 mcg. During Dialysis, Every 2 weeks   pantoprazole  40 MG tablet Commonly known as: PROTONIX  Take 1 tablet (40 mg total) by mouth daily. Start taking on: July 23, 2024   Vitamin D  (Ergocalciferol ) 1.25 MG (50000 UNIT) Caps capsule Commonly known as: DRISDOL  Take 1 capsule (50,000  Units total) by mouth every Thursday.       Allergies[1]  Follow-up Information     Bakare, Mobolaji B, MD. Call in 1 week(s).   Specialty: Internal Medicine Why: please call to schedule you a hospital follow up one week from today. Contact information: [address] [place] [address] [phone]                  The results of significant diagnostics from this hospitalization (including imaging, microbiology, ancillary and laboratory) are listed below for reference.    Significant Diagnostic Studies: DG ESOPHAGUS W SINGLE CM (SOL OR THIN BA) Result Date: 07/22/2024 CLINICAL DATA:  44 year old female. Endorsing dysphagia and vomiting. Request is for esophagram for further evaluation EXAM: ESOPHAGUS/BARIUM SWALLOW/TABLET STUDY TECHNIQUE: Single contrast examination was performed using thin liquid barium. This exam was performed by Delon Beagle NP, and was supervised and interpreted by Dr. Juliene Balder. FLUOROSCOPY: Radiation Exposure Index (as provided by the fluoroscopic  device): 6.2 mGy Kerma COMPARISON:  None Available. FINDINGS: Swallowing: Appears normal. No vestibular penetration or aspiration seen. Pharynx: Unremarkable. Esophagus: Normal appearance. Mild tapering near the GE junction but no significant narrowing or stricture. Esophageal motility: Mild-to-moderate dysmotility with retropulsion of contrast in the mid and distal esophagus. Hiatal Hernia: None. Gastroesophageal reflux: Spontaneous gastroesophageal reflux noted to the lower third of the esophagus Ingested 13mm barium tablet: Transient stasis of tablet proximal to the GE junction. Eventually passing with sips of thin barium Other: None. IMPRESSION: Mild to moderate esophageal dysmotility. Spontaneous gastroesophageal reflux noted in the lower third of the esophagus. Barium tablet was transiently stuck in the distal esophagus just proximal to the GE junction. No focal stricture in this area and  the transient stasis may be associated with esophageal dysmotility. Electronically Signed   By: Juliene Balder M.D.   On: 07/22/2024 17:11   IR TUNNELED CENTRAL VENOUS CATH [facility] W IMG Result Date: 07/19/2024 INDICATION: 44 year old with end-stage renal disease. Poorly functioning left upper extremity AV graft. EXAM: FLUOROSCOPIC AND ULTRASOUND GUIDED PLACEMENT OF A TUNNELED DIALYSIS CATHETER Physician: Juliene SAUNDERS. Balder, MD MEDICATIONS: Ancef  2 g; The antibiotic was administered within an appropriate time interval prior to skin puncture. ANESTHESIA/SEDATION: Moderate (conscious) sedation was employed during this procedure. A total of Versed  2 mg and fentanyl  100 mcg was administered intravenously at the order of the provider performing the procedure. Total intra-service moderate sedation time: 21 minutes. Patient's level of consciousness and vital signs were monitored continuously by radiology nurse throughout the procedure under the supervision of the provider performing the procedure. FLUOROSCOPY TIME:  Radiation Exposure Index (as provided by the fluoroscopic device): 2 mGy Kerma COMPLICATIONS: None immediate. PROCEDURE: The procedure was explained to the patient. The risks and benefits of the procedure were discussed and the patient's questions were addressed. Informed consent was obtained from the patient. The patient was placed supine on the interventional table. Ultrasound confirmed a patent right internal jugular vein. Ultrasound image obtained for documentation. The right neck and chest was prepped and draped in a sterile fashion. Maximal barrier sterile technique was utilized including caps, mask, sterile gowns, sterile gloves, sterile drape, hand hygiene and skin antiseptic. The right neck was anesthetized with 1% lidocaine . A small incision was made with #11 blade scalpel. A 21 gauge needle directed into the right internal jugular vein with ultrasound guidance. A micropuncture dilator set was placed. A 19 cm tip  to cuff Palindrome catheter was selected. The skin below the right clavicle was anesthetized and a small incision was made with an #11 blade scalpel. A subcutaneous tunnel was formed to the vein dermatotomy site. The catheter was brought through the tunnel. The vein dermatotomy site was dilated to accommodate a peel-away sheath over a wire. The catheter was placed through the peel-away sheath and directed into the central venous structures. The tip of the catheter was placed at superior cavoatrial junction with fluoroscopy. Fluoroscopic images were obtained for documentation. Both lumens were found to aspirate and flush well. The proper amount of heparin  was flushed in both lumens. The vein dermatotomy site was closed using a single layer of absorbable suture and Dermabond. Gel-Foam was placed in subcutaneous tract. The catheter was secured to the skin using Prolene suture. IMPRESSION: Successful placement of a right jugular tunneled dialysis catheter using ultrasound and fluoroscopic guidance. Electronically Signed   By: Juliene Balder M.D.   On: 07/19/2024 13:23   IR DIALY SHUNT INTRO NEEDLE/INTRACATH INITIAL W/IMG LEFT Result Date: 07/16/2024 INDICATION:  44 year old with end-stage renal disease. Left upper arm AV graft is not functioning well. EXAM: Left upper extremity shuntogram Ultrasound guidance for vascular access MEDICATIONS: Moderate sedation ANESTHESIA/SEDATION: Moderate (conscious) sedation was employed during this procedure. A total of Versed  2 mg and Fentanyl  75 mcg was administered intravenously by the radiology nurse. Total intra-service moderate Sedation Time: 18 minutes. The patient's level of consciousness and vital signs were monitored continuously by radiology nursing throughout the procedure under my direct supervision. FLUOROSCOPY: Radiation Exposure Index (as provided by the fluoroscopic device): 17 mGy Kerma CONTRAST:  60 mL Omnipaque  300 COMPLICATIONS: None immediate. PROCEDURE: Informed  written consent was obtained from the patient after a thorough discussion of the procedural risks, benefits and alternatives. All questions were addressed. Maximal Sterile Barrier Technique was utilized including caps, mask, sterile gowns, sterile gloves, sterile drape, hand hygiene and skin antiseptic. A timeout was performed prior to the initiation of the procedure. Left upper arm was prepped and draped in sterile fashion. Ultrasound confirmed a patent left upper arm AV graft. Ultrasound image was saved for documentation. Skin was anesthetized 1% lidocaine . Using ultrasound guidance, 21 gauge needle was directed into the graft. Micropuncture dilator set was placed. Left upper extremity shuntogram images were obtained. Micropuncture catheter was removed with manual compression. FINDINGS: Left upper arm AV graft is widely patent. The vascular stent involving the venous anastomosis and outflow vein is widely patent. Central veins are widely patent. Reflux shuntogram images demonstrate normal tapering near the arterial anastomosis with no significant stenosis. Arterial anastomosis is widely patent. IMPRESSION: Left upper arm AV graft is widely patent. No intervention performed. ACCESS: This access remains amenable to future percutaneous interventions as clinically indicated. Electronically Signed   By: Juliene Balder M.D.   On: 07/16/2024 20:53   DG Chest Portable 1 View Result Date: 07/15/2024 EXAM: 1 VIEW(S) XRAY OF THE CHEST 07/15/2024 08:23:00 AM COMPARISON: 06/10/2022 CLINICAL HISTORY: cp FINDINGS: LUNGS AND PLEURA: Low lung volume. Prominent pulmonary vasculature. No focal pulmonary opacity. No pleural effusion. No pneumothorax. HEART AND MEDIASTINUM: Mild cardiomegaly. BONES AND SOFT TISSUES: Chronic fracture deformity of left posterior sixth and seventh ribs. IMPRESSION: 1. No acute cardiopulmonary abnormality. 2. Mild cardiomegaly with prominent pulmonary vasculature. Electronically signed by: Waddell Calk  MD 07/15/2024 08:32 AM EST RP Workstation: [id number]    Microbiology: No results found for this or any previous visit (from the past 240 hours).   Labs: Basic Metabolic Panel: Recent Labs  Lab 07/18/24 0236 07/19/24 0334 07/20/24 0619 07/21/24 0543 07/21/24 1517 07/22/24 0444  NA 141 142 138 138 136 138  K 3.9 4.0 3.8 4.0 4.0 4.1  CL 104 106 100 100 97* 100  CO2 24 22 28 28 27 28   GLUCOSE 100* 90 103* 95 203* 128*  BUN 59* 67* 29* 23* 28* 34*  CREATININE 6.26* 7.09* 4.18* 3.96* 4.65* 5.62*  CALCIUM  7.6* 7.1* 7.9* 7.9* 8.5* 8.1*  PHOS 5.8* 6.0* 3.5 4.0  --  4.9*   Liver Function Tests: Recent Labs  Lab 07/16/24 0338 07/17/24 0309 07/18/24 0236 07/19/24 0334 07/20/24 0619 07/21/24 0543 07/21/24 1517 07/22/24 0444  AST 57* 62*  --   --   --   --  38  --   ALT 80* 69*  --   --   --   --  23  --   ALKPHOS 67 70  --   --   --   --  77  --   BILITOT 0.5 0.4  --   --   --   --  0.4  --   PROT 6.4* 6.2*  --   --   --   --  7.1  --   ALBUMIN  3.2* 3.2*   < > 3.1* 3.3* 3.1* 3.5 3.3*   < > = values in this interval not displayed.   Recent Labs  Lab 07/21/24 1517  LIPASE 54*   No results for input(s): AMMONIA in the last 168 hours. CBC: Recent Labs  Lab 07/17/24 0309 07/18/24 0236 07/19/24 0334 07/20/24 0619 07/22/24 0444  WBC 9.2 7.8 9.2 9.3 9.6  NEUTROABS  --  5.5 5.5 6.9 6.3  HGB 7.1* 7.3* 7.2* 8.4* 8.4*  HCT 22.0* 21.8* 22.0* 26.4* 26.2*  MCV 89.8 87.2 88.7 90.7 91.3  PLT 215 238 227 240 315   Cardiac Enzymes: No results for input(s): CKTOTAL, CKMB, CKMBINDEX, TROPONINI in the last 168 hours. BNP: BNP (last 3 results) Recent Labs    10/11/23 0920  BNP 103.5*    ProBNP (last 3 results) Recent Labs    06/10/24 0921 07/15/24 0832  PROBNP 4,761.0* 10,336.0*    CBG: Recent Labs  Lab 07/18/24 1239 07/18/24 1934 07/19/24 0804 07/20/24 0745 07/22/24 0759  GLUCAP 137* 133* 78 96 135*    Signed:  Colen Grimes MD   [facility] 07/22/2024, 5:21 PM      [1] No Known Allergies

## 2024-07-22 NOTE — Progress Notes (Signed)
 Pt to d/c this afternoon per attending. Contacted [facility] to be advised of pt's d/c today and that pt should resume care tomorrow.   Randine Mungo Dialysis Navigator [phone]

## 2024-07-22 NOTE — Plan of Care (Signed)
" °  Problem: Health Behavior/Discharge Planning: Goal: Ability to manage health-related needs will improve Outcome: Completed/Met   Problem: Clinical Measurements: Goal: Ability to maintain clinical measurements within normal limits will improve Outcome: Completed/Met Goal: Will remain free from infection Outcome: Completed/Met Goal: Diagnostic test results will improve Outcome: Completed/Met Goal: Respiratory complications will improve Outcome: Completed/Met Goal: Cardiovascular complication will be avoided Outcome: Completed/Met   Problem: Activity: Goal: Risk for activity intolerance will decrease Outcome: Completed/Met   Problem: Nutrition: Goal: Adequate nutrition will be maintained Outcome: Completed/Met   Problem: Coping: Goal: Level of anxiety will decrease Outcome: Completed/Met   Problem: Elimination: Goal: Will not experience complications related to bowel motility Outcome: Completed/Met Goal: Will not experience complications related to urinary retention Outcome: Completed/Met   Problem: Pain Managment: Goal: General experience of comfort will improve and/or be controlled Outcome: Completed/Met   Problem: Safety: Goal: Ability to remain free from injury will improve Outcome: Completed/Met   Problem: Skin Integrity: Goal: Risk for impaired skin integrity will decrease Outcome: Completed/Met   Problem: Education: Goal: Knowledge of General Education information will improve Description: Including pain rating scale, medication(s)/side effects and non-pharmacologic comfort measures Outcome: Completed/Met   Problem: Health Behavior/Discharge Planning: Goal: Ability to manage health-related needs will improve Outcome: Completed/Met   Problem: Clinical Measurements: Goal: Ability to maintain clinical measurements within normal limits will improve Outcome: Completed/Met Goal: Will remain free from infection Outcome: Completed/Met Goal: Diagnostic test  results will improve Outcome: Completed/Met Goal: Respiratory complications will improve Outcome: Completed/Met Goal: Cardiovascular complication will be avoided Outcome: Completed/Met   Problem: Activity: Goal: Risk for activity intolerance will decrease Outcome: Completed/Met   Problem: Nutrition: Goal: Adequate nutrition will be maintained Outcome: Completed/Met   Problem: Coping: Goal: Level of anxiety will decrease Outcome: Completed/Met   Problem: Elimination: Goal: Will not experience complications related to bowel motility Outcome: Completed/Met Goal: Will not experience complications related to urinary retention Outcome: Completed/Met   Problem: Pain Managment: Goal: General experience of comfort will improve and/or be controlled Outcome: Completed/Met   Problem: Safety: Goal: Ability to remain free from injury will improve Outcome: Completed/Met   Problem: Skin Integrity: Goal: Risk for impaired skin integrity will decrease Outcome: Completed/Met   "

## 2024-07-22 NOTE — TOC Transition Note (Signed)
 Transition of Care [facility]) - Discharge Note   Patient Details  Name: Nakhia Levitan MRN: [id number] Date of Birth: 1981/01/04  [facility]) CM/SW Contact:  Tom-Johnson, Kannen Moxey Daphne, RN Phone Number: 07/22/2024, 5:32 PM   Clinical Narrative:     Patient is scheduled for discharge today.  Readmission Risk Assessment done. Hospital f/u and discharge instructions on AVS. No ICM needs or recommendations noted. Cab voucher will be given to patient at the d/c lounge to transport at discharge.  No further ICM needs noted.     Final next level of care: Home/Self Care Barriers to Discharge: Barriers Resolved   Patient Goals and CMS Choice Patient states their goals for this hospitalization and ongoing recovery are:: To return home CMS Medicare.gov Compare Post Acute Care list provided to:: Patient        Discharge Placement                Patient to be transferred to facility by: [facility]      Discharge Plan and Services Additional resources added to the After Visit Summary for                  DME Arranged: N/A DME Agency: NA       HH Arranged: NA HH Agency: NA        Social Drivers of Health (SDOH) Interventions SDOH Screenings   Food Insecurity: No Food Insecurity (07/15/2024)  Housing: Low Risk (07/15/2024)  Transportation Needs: No Transportation Needs (07/15/2024)  Utilities: Not At Risk (07/15/2024)  Social Connections: Socially Integrated (10/11/2023)  Tobacco Use: Low Risk (07/19/2024)     Readmission Risk Interventions    07/16/2024    4:59 PM 02/09/2024    4:24 PM 10/12/2023   11:30 AM  Readmission Risk Prevention Plan  Transportation Screening Complete Complete Complete  PCP or Specialist Appt within 5-7 Days   Complete  PCP or Specialist Appt within 3-5 Days  Complete   Home Care Screening   Complete  Medication Review (RN CM)   Complete  HRI or Home Care Consult  Complete   Social Work Consult for Recovery Care  Planning/Counseling  Complete   Palliative Care Screening  Not Applicable   Medication Review Oceanographer) Referral to Pharmacy Referral to Pharmacy   PCP or Specialist appointment within 3-5 days of discharge Complete    HRI or Home Care Consult Complete    SW Recovery Care/Counseling Consult Complete    Palliative Care Screening Not Applicable    Skilled Nursing Facility Not Applicable

## 2024-07-22 NOTE — Progress Notes (Signed)
 " [facility] Progress Note   Subjective:   Seen in room - feels much better today, no N/V this AM - says Reglan  helped. no CP/dyspnea.  Objective Vitals:   07/21/24 2143 07/22/24 0404 07/22/24 0405 07/22/24 0757  BP: 132/68 (!) 93/52 (!) 91/49 135/73  Pulse: 95 97 96 100  Resp: 18 19 19 19   Temp: 98.6 F (37 C) 98.7 F (37.1 C) 98.7 F (37.1 C) 98.2 F (36.8 C)  TempSrc:   Oral   SpO2: 100% 100% 100% 100%  Weight:      Height:       Physical Exam General: Well appearing, NAD. Room air Heart: RRR Lungs: CTAB; no rales Abdomen: soft Extremities: no LE edema Dialysis Access: [facility]  Additional Objective Labs: Basic Metabolic Panel: Recent Labs  Lab 07/20/24 0619 07/21/24 0543 07/21/24 1517 07/22/24 0444  NA 138 138 136 138  K 3.8 4.0 4.0 4.1  CL 100 100 97* 100  CO2 28 28 27 28   GLUCOSE 103* 95 203* 128*  BUN 29* 23* 28* 34*  CREATININE 4.18* 3.96* 4.65* 5.62*  CALCIUM  7.9* 7.9* 8.5* 8.1*  PHOS 3.5 4.0  --  4.9*   Liver Function Tests: Recent Labs  Lab 07/16/24 0338 07/17/24 0309 07/18/24 0236 07/21/24 0543 07/21/24 1517 07/22/24 0444  AST 57* 62*  --   --  38  --   ALT 80* 69*  --   --  23  --   ALKPHOS 67 70  --   --  77  --   BILITOT 0.5 0.4  --   --  0.4  --   PROT 6.4* 6.2*  --   --  7.1  --   ALBUMIN  3.2* 3.2*   < > 3.1* 3.5 3.3*   < > = values in this interval not displayed.   Recent Labs  Lab 07/21/24 1517  LIPASE 54*   CBC: Recent Labs  Lab 07/17/24 0309 07/17/24 0309 07/18/24 0236 07/19/24 0334 07/20/24 0619 07/22/24 0444  WBC 9.2  --  7.8 9.2 9.3 9.6  NEUTROABS  --    < > 5.5 5.5 6.9 6.3  HGB 7.1*  --  7.3* 7.2* 8.4* 8.4*  HCT 22.0*  --  21.8* 22.0* 26.4* 26.2*  MCV 89.8  --  87.2 88.7 90.7 91.3  PLT 215  --  238 227 240 315   < > = values in this interval not displayed.    Medications:   calcitRIOL   0.5 mcg Oral Daily   calcium  acetate  1,334 mg Oral TID WC   Chlorhexidine  Gluconate Cloth  6 each Topical  Daily   darbepoetin (ARANESP ) injection - DIALYSIS  150 mcg Subcutaneous Q Tue-1800   diltiazem   30 mg Oral Q12H   folic acid   1 mg Oral Daily   furosemide   80 mg Intravenous Daily   pantoprazole   40 mg Oral Daily   senna-docusate  1 tablet Oral QHS   sodium chloride  flush  10-40 mL Intracatheter Q12H   sodium chloride  flush  3 mL Intravenous Q12H    Dialysis Orders TTS - [facility] 4hr, 500/A1.5, EDW 75kg, 3K/2.5Ca, L AVG Meds: mircera 225 mcg every 2 weeks (which was last given on 12/2); iron 50 mg weekly; hectorol 9 mcg every treatment 3/week   Assessment/Plan: AVG infiltration: Leading to poor dialysis, s/p shutogram which is widely patent. TDC placed 1/2/2 ESRD: TTS schedule. S/p HD l1/2, 1/3. Back to dry weight. Next HD 1/6, likely  as outpatient. HTN/volume: BP good, now back to dry weight. No LE edema. Anemia of ESRD: S/p 1U PRBCs on 12/29. Hgb 8.4 - on Aranesp  q Tues here, continue ESA as outatient. Secondary HPTH: CorrCa/ Phos ok - contine home meds. Nutrition: Alb low, encouarged ^ protein diet Elevated LFTs: Have trended back to normal. N/V: Improved with reglan , resolved. Dispo: Ok from renal standpoint today.   Izetta Boehringer, PA-C 07/22/2024, 11:36 AM  [facility]    "

## 2024-07-23 ENCOUNTER — Telehealth: Payer: Self-pay | Admitting: *Deleted

## 2024-07-23 NOTE — Transitions of Care (Post Inpatient/ED Visit) (Signed)
 "  07/23/2024  Name: Jacqueline [name] MRN: [id number] DOB: 10-23-1980  Today's TOC FU Call Status: Today's TOC FU Call Status:: Successful TOC FU Call Completed TOC FU Call Complete Date: 07/23/24  Patient's Name and Date of Birth confirmed. Name, DOB  Transition Care Management Follow-up Telephone Call Date of Discharge: 07/22/24 Discharge Facility: [facility]) Type of Discharge: Inpatient Admission Primary Inpatient Discharge Diagnosis:: ESRD (end stage renal disease) How have you been since you were released from the hospital?: Better Any questions or concerns?: No  Items Reviewed: Did you receive and understand the discharge instructions provided?: Yes Medications obtained,verified, and reconciled?: Yes (Medications Reviewed) Any new allergies since your discharge?: No Dietary orders reviewed?: No Do you have support at home?: Yes People in Home [RPT]: child(ren), dependent, spouse Name of Support/Comfort Primary Source: P[place]  Medications Reviewed Today: Medications Reviewed Today     Reviewed by Kennieth Cathlean DEL, RN (Case Manager) on 07/23/24 at 1213  Med List Status: <None>   Medication Order Taking? Sig Documenting Provider Last Dose Status Informant  acetaminophen  (TYLENOL ) 500 MG tablet [id number] Yes Take 1,000 mg by mouth 2 (two) times daily as needed for moderate pain (pain score 4-6), fever or headache. [provider]  Active Self  calcitRIOL  (ROCALTROL ) 0.5 MCG capsule [id number] Yes Take 0.5 mcg by mouth daily. Prior to dialysis on Tuesday, Thursday, Saturdays only [provider]  Active Self  calcium  acetate (PHOSLO ) 667 MG capsule [id number] Yes Take 1,334 mg by mouth 3 (three) times daily with meals. [provider]  Active Self  diltiazem  (CARDIZEM  CD) 120 MG 24 hr capsule [id number] Yes Take 1 capsule (120 mg total) by mouth every morning. Gherghe, Costin M, MD  Active Self  furosemide  (LASIX ) 40 MG tablet [id number] Yes Take 80 mg by  mouth daily. [provider]  Active Self  megestrol  (MEGACE ) 40 MG tablet [id number] Yes Take 1 tablet (40 mg total) by mouth 3 (three) times daily. Vann, Jessica U, DO  Active Self  Methoxy PEG-Epoetin  Beta (MIRCERA IJ) [id number] Yes 200 mcg. During Dialysis, Every 2 weeks [provider]  Active Self  metoCLOPramide  (REGLAN ) 10 MG tablet [id number] Yes Take 1 tablet (10 mg total) by mouth 3 (three) times daily with meals. Samtani, Jai-Gurmukh, MD  Active   pantoprazole  (PROTONIX ) 40 MG tablet [id number] Yes Take 1 tablet (40 mg total) by mouth daily. Samtani, Jai-Gurmukh, MD  Active   Vitamin D , Ergocalciferol , (DRISDOL ) 1.25 MG (50000 UNIT) CAPS capsule [id number] Yes Take 1 capsule (50,000 Units total) by mouth every Thursday. Pegge Toribio PARAS, PA-C  Active Self           Med Note Jacqueline SUZEN LITTIE Jacqueline [name] 29, 2025 12:13 PM) Gets at dialysis center            Home Care and Equipment/Supplies: Were Home Health Services Ordered?: NA Any new equipment or medical supplies ordered?: NA  Functional Questionnaire: Do you need assistance with bathing/showering or dressing?: No Do you need assistance with meal preparation?: No Do you need assistance with eating?: No Do you have difficulty maintaining continence: No Do you need assistance with getting out of bed/getting out of a chair/moving?: No Do you have difficulty managing or taking your medications?: No  Follow up appointments reviewed: PCP Follow-up appointment confirmed?: No MD Provider Line Number:[phone] Given: Yes (Perpatient she will call for a follow up appointment) Specialist Hospital Follow-up appointment confirmed?: NA Do you need transportation to your follow-up appointment?:  No Do you understand care options if your condition(s) worsen?: Yes-patient verbalized understanding  SDOH Interventions Today    Flowsheet Row Most Recent Value  SDOH Interventions   Food Insecurity Interventions  Intervention Not Indicated  Housing Interventions Intervention Not Indicated  Transportation Interventions Intervention Not Indicated  Utilities Interventions Intervention Not Indicated    Goals Addressed             This Visit's Progress    VBCI Transitions of Care (TOC) Care Plan       Problems:  Recent Hospitalization for treatment of ESRD Knowledge Deficit Related to End stage renal on dialysis with AVG dysfunction  Goal:  Over the next 30 days, the patient will not experience hospital readmission  Interventions:    Chronic Kidney Disease Interventions: Evaluation of current treatment plan related to chronic kidney disease self management and patient's adherence to plan as established by provider      Reviewed medications with patient and discussed importance of compliance    Reviewed scheduled/upcoming provider appointments including    Discussed plans with patient for ongoing care management follow up and provided patient with direct contact information for care management team    Last practice recorded BP readings:  BP Readings from Last 3 Encounters:  07/22/24 130/78  06/13/24 130/74  06/05/24 (!) 146/77   Most recent eGFR/CrCl: No results found for: EGFR  No components found for: CRCL  Patient Self Care Activities:  Attend all scheduled provider appointments Call pharmacy for medication refills 3-7 days in advance of running out of medications Call provider office for new concerns or questions  Notify RN Care Manager of TOC call rescheduling needs Participate in Transition of Care Program/Attend TOC scheduled calls Perform all self care activities independently  Perform IADL's (shopping, preparing meals, housekeeping, managing finances) independently Take medications as prescribed   Assess site for signs of infection , maintained bruit/thrill Adhere to diet and fluid restrictions  Plan:  An initial telephone outreach has been scheduled for: [phone] Next  PCP appointment scheduled for: Patient will call for follow up appointment Telephone follow up appointment with care management team member scheduled for:  07/31/2024 1:00 Dionne Leath Attend Earlean Everts an Sat dialysis      Discussed and offered 30 day TOC program.  Patient  accepted.  The patient has been provided with contact information for the care management team and has been advised to call with any health -related questions or concerns.  The patient verbalized understanding with current plan of care.  The patient is directed to their insurance card regarding availability of benefits coverage   Cathlean Headland BSN RN [facility] Health Care Management Coordinator [email or web address] Direct Dial: [phone]  Fax: [phone] Website: [email or web address]     "

## 2024-07-23 NOTE — Discharge Planning (Signed)
 [facility] Patient Discharge Orders - [facility] CLINIC: [place]  Patient's name: Jacqueline [name] Admit/DC Dates: 07/15/2024 - 07/22/2024  DISCHARGE DIAGNOSES: AVG infiltration/dysfunction AHRF in saetting of missed HD N/V - s/p barium swallow, self-resolved stasis anemia  HD ORDER CHANGES: Heparin  change: no EDW Change: no  Bath Change: no  ANEMIA MANAGEMENT: Aranesp : Given: no  ESA dose for discharge: mircera 225 mcg IV q 2 weeks, to start on 1/6 IV Iron dose at discharge: per protocol Transfusion: Given: yes - 1U on 12/29  BONE/MINERAL MEDICATIONS: Hectorol/Calcitriol  change: no Sensipar/Parsabiv change: no  ACCESS INTERVENTION/CHANGE: yes - TDC placed, s/p shuntogram which was patent Details:   RECENT LABS: Recent Labs  Lab 07/22/24 0444  HGB 8.4*  NA 138  K 4.1  CALCIUM  8.1*  PHOS 4.9*  ALBUMIN  3.3*    IV ANTIBIOTICS: no Details:  OTHER ANTICOAGULATION: no Details:  OTHER/APPTS/LAB ORDERS:   D/C Meds to be reconciled by nurse after every discharge.  Completed By: Izetta Boehringer, PA-C [facility] Pager [phone]   Reviewed by: MD:______ RN_______

## 2024-07-31 ENCOUNTER — Telehealth: Payer: Self-pay

## 2024-08-22 ENCOUNTER — Emergency Department (HOSPITAL_BASED_OUTPATIENT_CLINIC_OR_DEPARTMENT_OTHER)

## 2024-08-22 ENCOUNTER — Other Ambulatory Visit: Payer: Self-pay

## 2024-08-22 ENCOUNTER — Emergency Department (HOSPITAL_BASED_OUTPATIENT_CLINIC_OR_DEPARTMENT_OTHER)
Admission: EM | Admit: 2024-08-22 | Discharge: 2024-08-22 | Disposition: A | Discharge status: Home / Self Care | Attending: Emergency Medicine | Admitting: Emergency Medicine

## 2024-08-22 ENCOUNTER — Encounter (HOSPITAL_BASED_OUTPATIENT_CLINIC_OR_DEPARTMENT_OTHER): Payer: Self-pay

## 2024-08-22 DIAGNOSIS — Z79899 Other long term (current) drug therapy: Secondary | ICD-10-CM | POA: Insufficient documentation

## 2024-08-22 DIAGNOSIS — Z992 Dependence on renal dialysis: Secondary | ICD-10-CM | POA: Insufficient documentation

## 2024-08-22 DIAGNOSIS — M7989 Other specified soft tissue disorders: Secondary | ICD-10-CM | POA: Insufficient documentation

## 2024-08-22 DIAGNOSIS — I129 Hypertensive chronic kidney disease with stage 1 through stage 4 chronic kidney disease, or unspecified chronic kidney disease: Secondary | ICD-10-CM | POA: Insufficient documentation

## 2024-08-22 DIAGNOSIS — E1122 Type 2 diabetes mellitus with diabetic chronic kidney disease: Secondary | ICD-10-CM | POA: Insufficient documentation

## 2024-08-22 DIAGNOSIS — N189 Chronic kidney disease, unspecified: Secondary | ICD-10-CM | POA: Insufficient documentation

## 2024-08-22 DIAGNOSIS — R1032 Left lower quadrant pain: Secondary | ICD-10-CM | POA: Insufficient documentation

## 2024-08-22 LAB — CBC WITH DIFFERENTIAL/PLATELET
Abs Immature Granulocytes: 0.03 10*3/uL (ref 0.00–0.07)
Basophils Absolute: 0.1 10*3/uL (ref 0.0–0.1)
Basophils Relative: 1 %
Eosinophils Absolute: 0.5 10*3/uL (ref 0.0–0.5)
Eosinophils Relative: 5 %
HCT: 29 % — ABNORMAL LOW (ref 36.0–46.0)
Hemoglobin: 9 g/dL — ABNORMAL LOW (ref 12.0–15.0)
Immature Granulocytes: 0 %
Lymphocytes Relative: 19 %
Lymphs Abs: 1.7 10*3/uL (ref 0.7–4.0)
MCH: 27.6 pg (ref 26.0–34.0)
MCHC: 31 g/dL (ref 30.0–36.0)
MCV: 89 fL (ref 80.0–100.0)
Monocytes Absolute: 0.8 10*3/uL (ref 0.1–1.0)
Monocytes Relative: 9 %
Neutro Abs: 5.9 10*3/uL (ref 1.7–7.7)
Neutrophils Relative %: 66 %
Platelets: 255 10*3/uL (ref 150–400)
RBC: 3.26 MIL/uL — ABNORMAL LOW (ref 3.87–5.11)
RDW: 16 % — ABNORMAL HIGH (ref 11.5–15.5)
WBC: 9 10*3/uL (ref 4.0–10.5)
nRBC: 0 % (ref 0.0–0.2)

## 2024-08-22 LAB — COMPREHENSIVE METABOLIC PANEL WITH GFR
ALT: 182 U/L — ABNORMAL HIGH (ref 0–44)
AST: 123 U/L — ABNORMAL HIGH (ref 15–41)
Albumin: 3.4 g/dL — ABNORMAL LOW (ref 3.5–5.0)
Alkaline Phosphatase: 75 U/L (ref 38–126)
Anion gap: 16 — ABNORMAL HIGH (ref 5–15)
BUN: 46 mg/dL — ABNORMAL HIGH (ref 6–20)
CO2: 21 mmol/L — ABNORMAL LOW (ref 22–32)
Calcium: 8.1 mg/dL — ABNORMAL LOW (ref 8.9–10.3)
Chloride: 105 mmol/L (ref 98–111)
Creatinine, Ser: 6.9 mg/dL — ABNORMAL HIGH (ref 0.44–1.00)
GFR, Estimated: 7 mL/min — ABNORMAL LOW
Glucose, Bld: 113 mg/dL — ABNORMAL HIGH (ref 70–99)
Potassium: 4.4 mmol/L (ref 3.5–5.1)
Sodium: 142 mmol/L (ref 135–145)
Total Bilirubin: 0.5 mg/dL (ref 0.0–1.2)
Total Protein: 6.7 g/dL (ref 6.5–8.1)

## 2024-08-22 LAB — LIPASE, BLOOD: Lipase: 91 U/L — ABNORMAL HIGH (ref 11–51)

## 2024-08-22 LAB — PREGNANCY, URINE: Preg Test, Ur: NEGATIVE

## 2024-08-22 LAB — PRO BRAIN NATRIURETIC PEPTIDE: Pro Brain Natriuretic Peptide: 6190 pg/mL — ABNORMAL HIGH

## 2024-08-22 MED ORDER — HYDROCODONE-ACETAMINOPHEN 5-325 MG PO TABS
1.0000 | ORAL_TABLET | Freq: Once | ORAL | Status: AC
  Administered 2024-08-22: 1 via ORAL
  Filled 2024-08-22: qty 1

## 2024-08-22 MED ORDER — HYDROMORPHONE HCL 1 MG/ML IJ SOLN
1.0000 mg | Freq: Once | INTRAMUSCULAR | Status: AC
  Administered 2024-08-22: 1 mg via INTRAVENOUS
  Filled 2024-08-22: qty 1

## 2024-08-22 NOTE — ED Notes (Signed)
 US  at bedside.

## 2024-08-22 NOTE — ED Provider Notes (Signed)
 " Jacqueline [name]   Jacqueline [name]: [id number] Arrival date & time: 08/22/24  9167     Patient presents with: Leg Swelling   Jacqueline [name] is a 44 y.o. female.  44 year old female presents emergency department with complaints of right leg swelling and pain x 1 week.  Patient has significant history of CKD on dialysis with a schedule of Tuesday Thursday Saturday.  Patient reports he went Tuesday but missed this morning due to coming here for right leg pain.  She reports last week she noticed some swelling in her her bilateral legs and thought she just needed dialysis.  She reports over the last couple days her right foot has gotten much more painful and swollen than the left.  Patient has significant history of hypertension, CKD, diabetes, anemia, necrotizing fasciitis.  Patient reports she does take megestrol  and does not have any significant history of blood clots.  Patient is not on any blood thinners.  She reports she had necrotizing fasciitis in her right thigh which she was hospitalized for over a month.  She reports she was unsure of cause.  She reports about a week ago she was having some symptoms of fever, nausea, vomiting, diarrhea with some mild abdominal pain.  Patient reports symptoms have began to ease.     Prior to Admission medications  Medication Sig Start Date End Date Taking? Authorizing Provider  acetaminophen  (TYLENOL ) 500 MG tablet Take 1,000 mg by mouth 2 (two) times daily as needed for moderate pain (pain score 4-6), fever or headache.    [provider]  calcitRIOL  (ROCALTROL ) 0.5 MCG capsule Take 0.5 mcg by mouth daily. Prior to dialysis on Tuesday, Thursday, Saturdays only 10/03/23   [provider]  calcium  acetate (PHOSLO ) 667 MG capsule Take 1,334 mg by mouth 3 (three) times daily with meals.    [provider]  diltiazem  (CARDIZEM  CD) 120 MG 24 hr capsule Take 1 capsule (120 mg total) by mouth  every morning. 02/16/24   Gherghe, Costin M, MD  furosemide  (LASIX ) 40 MG tablet Take 80 mg by mouth daily. 11/15/23   [provider]  megestrol  (MEGACE ) 40 MG tablet Take 1 tablet (40 mg total) by mouth 3 (three) times daily. 06/13/24   Vann, Jessica U, DO  Methoxy PEG-Epoetin  Beta (MIRCERA IJ) 200 mcg. During Dialysis, Every 2 weeks 06/18/24 06/17/25  [provider]  metoCLOPramide  (REGLAN ) 10 MG tablet Take 1 tablet (10 mg total) by mouth 3 (three) times daily with meals. 07/22/24 07/22/25  Samtani, Jai-Gurmukh, MD  pantoprazole  (PROTONIX ) 40 MG tablet Take 1 tablet (40 mg total) by mouth daily. 07/23/24   Samtani, Jai-Gurmukh, MD  Vitamin D , Ergocalciferol , (DRISDOL ) 1.25 MG (50000 UNIT) CAPS capsule Take 1 capsule (50,000 Units total) by mouth every Thursday. 07/07/22   Angiulli, Toribio PARAS, PA-C    Allergies: Patient has no known allergies.    Review of Systems  Constitutional:  Positive for fever.  Cardiovascular:  Positive for leg swelling.  Gastrointestinal:  Positive for abdominal pain, nausea and vomiting.  All other systems reviewed and are negative.   Updated Vital Signs BP (!) 169/79   Pulse 87   Temp 98.7 F (37.1 C) (Oral)   Resp 16   Wt 82.6 kg   LMP 08/21/2024   SpO2 99%   BMI 33.29 kg/m   Physical Exam Vitals and nursing [name] reviewed.  Constitutional:      General: She is not in acute  distress.    Appearance: Normal appearance. She is not ill-appearing or toxic-appearing.  HENT:     Head: Normocephalic and atraumatic.     Nose: Nose normal.  Eyes:     Extraocular Movements: Extraocular movements intact.     Conjunctiva/sclera: Conjunctivae normal.     Pupils: Pupils are equal, round, and reactive to light.  Cardiovascular:     Rate and Rhythm: Normal rate.  Pulmonary:     Effort: Pulmonary effort is normal. No respiratory distress.     Breath sounds: Normal breath sounds.     Comments: Lungs are clear to auscultation in all fields Abdominal:      General: Abdomen is flat.     Palpations: Abdomen is soft.     Tenderness: There is abdominal tenderness.     Comments: Mild tenderness noted in the left lower quadrant to deep palpation.  Musculoskeletal:        General: Swelling present. Normal range of motion.     Cervical back: Normal range of motion.     Comments: Patient has swelling noted to the right lower extremity, more so.  Patient has full range of motion throughout with increased pain to the right ankle.  Neurological:     General: No focal deficit present.     Mental Status: She is alert.  Psychiatric:        Mood and Affect: Mood normal.        Behavior: Behavior normal.     (all labs ordered are listed, but only abnormal results are displayed) Labs Reviewed  COMPREHENSIVE METABOLIC PANEL WITH GFR - Abnormal; Notable for the following components:      Result Value   CO2 21 (*)    Glucose, Bld 113 (*)    BUN 46 (*)    Creatinine, Ser 6.90 (*)    Calcium  8.1 (*)    Albumin  3.4 (*)    AST 123 (*)    ALT 182 (*)    GFR, Estimated 7 (*)    Anion gap 16 (*)    All other components within normal limits  LIPASE, BLOOD - Abnormal; Notable for the following components:   Lipase 91 (*)    All other components within normal limits  PRO BRAIN NATRIURETIC PEPTIDE - Abnormal; Notable for the following components:   Pro Brain Natriuretic Peptide 6,190.0 (*)    All other components within normal limits  CBC WITH DIFFERENTIAL/PLATELET - Abnormal; Notable for the following components:   RBC 3.26 (*)    Hemoglobin 9.0 (*)    HCT 29.0 (*)    RDW 16.0 (*)    All other components within normal limits  PREGNANCY, URINE    EKG: EKG Interpretation Date/Time:  Thursday August 22 2024 08:46:12 EST Ventricular Rate:  97 PR Interval:  139 QRS Duration:  73 QT Interval:  365 QTC Calculation: 464 R Axis:   113  Text Interpretation: Sinus rhythm Right axis deviation Low voltage, extremity leads Confirmed by Fredia Kaufman ([id number]) on 08/22/2024 10:23:54 AM  Radiology: CT ABDOMEN PELVIS WO CONTRAST Result Date: 08/22/2024 CLINICAL DATA:  Abdominal pain, acute, nonlocalized Pancreatitis, acute, severe Abdominal pain with nausea and vomiting for few days. EXAM: CT ABDOMEN AND PELVIS WITHOUT CONTRAST TECHNIQUE: Multidetector CT imaging of the abdomen and pelvis was performed following the standard protocol without IV contrast. RADIATION DOSE REDUCTION: This exam was performed according to the departmental dose-optimization program which includes automated exposure control, adjustment of the mA and/or kV according to  patient size and/or use of iterative reconstruction technique. COMPARISON:  Abdominopelvic CT 05/09/2023 and 06/18/2022. FINDINGS: Lower chest: Incompletely visualized, but grossly stable subpleural scarring laterally in the left lower lobe. Mild dependent atelectasis in both lungs. Hemodialysis catheter extends into the mid right atrium. Atherosclerosis of the aorta and coronary arteries noted. Hepatobiliary: The liver has a non cirrhotic morphology without suspicious focal abnormality on noncontrast imaging. No evidence of gallstones, gallbladder wall thickening or biliary dilatation. Pancreas: Unremarkable. No pancreatic ductal dilatation or surrounding inflammatory changes. Spleen: Normal in size without focal abnormality. Adrenals/Urinary Tract: Stable appearance of the adrenal glands. There is an unchanged low-density left adrenal nodule consistent with a lipid rich adenoma; no specific follow-up imaging recommended. Progressive renal cortical thinning bilaterally. Extensive renal vascular calcifications bilaterally. No evidence of renal mass or hydronephrosis. The bladder appears unremarkable for its degree of distention. Stomach/Bowel: No enteric contrast administered. Stable postsurgical changes from previous gastric bypass procedure. No evidence of bowel distension, wall thickening or surrounding  inflammation. Vascular/Lymphatic: There are no enlarged abdominal or pelvic lymph nodes. Diffuse vascular calcifications. Reproductive: The uterus and ovaries appear unremarkable. No adnexal mass. Other: Diffuse subcutaneous edema, similar to previous CT. No ascites or pneumoperitoneum. Musculoskeletal: Transitional lumbosacral anatomy with a transitional, largely sacralized L5 segment. New superior endplate compression deformity at L1 has sclerotic margins and does not appear acute. No definite acute or aggressive findings. IMPRESSION: 1. No acute findings or explanation for the patient's symptoms. 2. No CT evidence of pancreatitis. 3. Progressive renal cortical thinning bilaterally. 4. Stable postsurgical changes from previous gastric bypass procedure. 5. New superior endplate compression deformity at L1, not appearing acute. Correlate clinically. 6.  Aortic Atherosclerosis (ICD10-I70.0). Electronically Signed   By: Elsie Perone M.D.   On: 08/22/2024 15:09   DG Chest Portable 1 View Result Date: 08/22/2024 CLINICAL DATA:  Bilateral lower extremity swelling for several days. Shortness of breath. EXAM: PORTABLE CHEST 1 VIEW COMPARISON:  July 15, 2024. FINDINGS: Stable cardiomegaly. Right internal jugular dialysis catheter is noted. Both lungs are clear. The visualized skeletal structures are unremarkable. IMPRESSION: No active disease. Electronically Signed   By: Lynwood Landy Raddle M.D.   On: 08/22/2024 14:31   US  Venous Img Lower Bilateral Result Date: 08/22/2024 EXAM: ULTRASOUND DUPLEX OF THE BILATERAL LOWER EXTREMITY VEINS TECHNIQUE: Duplex ultrasound using B-mode/gray scaled imaging and Doppler spectral analysis and color flow was obtained of the deep venous structures of the bilateral lower extremity. COMPARISON: US  Bilateral Lower Extremity Venous 09/12/2018. CLINICAL HISTORY: 44 year old female with left leg pain bilateral leg swelling . FINDINGS: LEFT: The common femoral vein, femoral vein, popliteal  vein, and visible calf veins demonstrate normal compressibility with normal color flow and spectral analysis. The visible left greater saphenous vein appears patent. Calf subcutaneous edema is present. RIGHT: The common femoral vein, femoral vein, popliteal vein, and visible calf veins demonstrate normal compressibility with normal color flow and spectral analysis. The visible right greater saphenous vein appears patent. Calf subcutaneous edema is present. IMPRESSION: 1. No DVT in the bilateral lower extremities. 2. Bilateral calf subcutaneous edema. Electronically signed by: Helayne Hurst MD 08/22/2024 11:25 AM EST RP Workstation: [id number]   DG Foot Complete Right Result Date: 08/22/2024 CLINICAL DATA:  Acute right foot pain. EXAM: RIGHT FOOT COMPLETE - 3+ VIEW COMPARISON:  February 08, 2024. FINDINGS: Grossly stable chronic findings consistent with Charcot arthropathy. Vascular calcifications are again noted. No acute fracture or dislocation is noted. Mild posterior calcaneal spurring is noted. IMPRESSION: Grossly stable chronic findings  consistent with Charcot arthropathy. No acute abnormality seen. Electronically Signed   By: Lynwood Landy Raddle M.D.   On: 08/22/2024 10:25     Procedures   Medications Ordered in the ED  HYDROcodone -acetaminophen  (NORCO/VICODIN) 5-325 MG per tablet 1 tablet (1 tablet Oral Given 08/22/24 1002)  HYDROmorphone  (DILAUDID ) injection 1 mg (1 mg Intravenous Given 08/22/24 1231)    44 y.o. female presents to the ED with complaints of right leg pain and swelling, fever, nausea, vomiting, diarrhea.,  The differential diagnosis includes DVT, osteomyelitis, CHF exacerbation, dependent edema, venous insufficiency, viral illness, (Ddx)  On arrival pt is nontoxic, vitals unremarkable. Exam significant for right leg swelling with pain.  Lab Tests:  CMP CBC lipase and BNP. Lipase was elevated at 91.  CMP remarkable for creatinine elevated to 6.9 GFR down to 7.  Elevated LFTs.  Patient has  missed dialysis today as scheduled.  Imaging Studies ordered:  I ordered imaging studies which included DVT rule out and right foot x-ray.  Foot x-ray showed stable Charcot's foot.  DVT was negative  ED Course:   Patient is sitting comfortably in ED bed in no acute distress nontoxic-appearing on initial evaluation.  Patient does have more swelling in her right leg compared to the left.  Patient still has range of motion full sensation and pulses.    X-ray and ultrasound was negative for acute findings.  Elevated lipase with reports of abdominal pain with associated vomiting diarrhea will pursue CT scan without contrast at this time.  CT unremarkable for any acute findings.  Patient was advised of all findings at bedside.  Patient was advised to follow-up with PCP for further evaluation and she already has dialysis scheduled for tomorrow.  Patient was advised to continue to elevate and use compression socks for swelling.  Patient was advised strict return precautions at bedside.  Patient agrees with treatment plan and was comfortable with discharge at this time.   Portions of this [name] were generated with Scientist, clinical (histocompatibility and immunogenetics). Dictation errors may occur despite best attempts at proofreading.   Final diagnoses:  Leg swelling    ED Discharge Orders     None          Myriam Fonda RAMAN, [place] 08/22/24 1605  "

## 2024-08-22 NOTE — ED Triage Notes (Signed)
 Pt ambulatory to rooms. Reports bilateral lower leg swelling x 3 days. Right worse than left . SOB, fever, vomiting and diarrhea. Concerned feet are turning black. Discoloration noted around ankles. Last dialysis Tuesday. Suppose to go 5 am today, but concerned due to discoloration to feet. Feet are burning

## 2024-08-22 NOTE — Discharge Instructions (Signed)
 Imaging exam and lab work were overall reassuring.  It is very important that you get dialysis as soon as possible.  I want you to follow-up with your primary care provider soon as possible to discuss the swelling please use compression stockings and elevation in the meantime.  Monitor for any concerning new or worsening symptoms if anything does arise please return to emergency department for further evaluation.

## 2024-08-22 NOTE — ED Notes (Signed)
 Lab notified of urine preg add-on. Specimen already sent to lab.

## 2024-08-23 NOTE — ED Provider Notes (Signed)
.  Ultrasound ED Peripheral IV (Provider)  Date/Time: 08/23/2024 11:14 AM  Performed by: Vinnie Gombert Hima, MD Authorized by: Fredia Rosette Kirsch, MD   Procedure details:    Indications: multiple failed IV attempts     Skin Prep: chlorhexidine  gluconate     Location:  Right AC   Angiocath:  18 G   Bedside Ultrasound Guided: Yes     Images: not archived     Patient tolerated procedure without complications: Yes     Dressing applied: Yes       Michalina Calbert Hima, MD 08/23/24 1115
